# Patient Record
Sex: Male | Born: 1940
Health system: Southern US, Community
[De-identification: ages and names within clinical notes are randomized; demographics above are authoritative.]

## PROBLEM LIST (undated history)

## (undated) DIAGNOSIS — K5790 Diverticulosis of intestine, part unspecified, without perforation or abscess without bleeding: Secondary | ICD-10-CM

## (undated) DIAGNOSIS — F17200 Nicotine dependence, unspecified, uncomplicated: Secondary | ICD-10-CM

## (undated) DIAGNOSIS — I10 Essential (primary) hypertension: Secondary | ICD-10-CM

## (undated) DIAGNOSIS — N529 Male erectile dysfunction, unspecified: Secondary | ICD-10-CM

## (undated) DIAGNOSIS — R918 Other nonspecific abnormal finding of lung field: Secondary | ICD-10-CM

## (undated) DIAGNOSIS — Z972 Presence of dental prosthetic device (complete) (partial): Secondary | ICD-10-CM

## (undated) DIAGNOSIS — Z8739 Personal history of other diseases of the musculoskeletal system and connective tissue: Secondary | ICD-10-CM

## (undated) DIAGNOSIS — C61 Malignant neoplasm of prostate: Secondary | ICD-10-CM

## (undated) DIAGNOSIS — K219 Gastro-esophageal reflux disease without esophagitis: Secondary | ICD-10-CM

## (undated) DIAGNOSIS — R590 Localized enlarged lymph nodes: Secondary | ICD-10-CM

## (undated) DIAGNOSIS — I517 Cardiomegaly: Secondary | ICD-10-CM

## (undated) DIAGNOSIS — E785 Hyperlipidemia, unspecified: Secondary | ICD-10-CM

## (undated) DIAGNOSIS — E119 Type 2 diabetes mellitus without complications: Secondary | ICD-10-CM

## (undated) DIAGNOSIS — I6529 Occlusion and stenosis of unspecified carotid artery: Secondary | ICD-10-CM

## (undated) DIAGNOSIS — C3490 Malignant neoplasm of unspecified part of unspecified bronchus or lung: Secondary | ICD-10-CM

## (undated) DIAGNOSIS — Z973 Presence of spectacles and contact lenses: Secondary | ICD-10-CM

## (undated) HISTORY — DX: Gastro-esophageal reflux disease without esophagitis: K21.9

## (undated) HISTORY — DX: Male erectile dysfunction, unspecified: N52.9

## (undated) HISTORY — DX: Malignant neoplasm of prostate: C61

## (undated) HISTORY — PX: UPPER GASTROINTESTINAL ENDOSCOPY: SHX188

## (undated) HISTORY — DX: Diverticulosis of intestine, part unspecified, without perforation or abscess without bleeding: K57.90

## (undated) HISTORY — DX: Cardiomegaly: I51.7

## (undated) HISTORY — DX: Hyperlipidemia, unspecified: E78.5

## (undated) HISTORY — DX: Nicotine dependence, unspecified, uncomplicated: F17.200

## (undated) HISTORY — DX: Occlusion and stenosis of unspecified carotid artery: I65.29

## (undated) HISTORY — DX: Essential (primary) hypertension: I10

## (undated) HISTORY — DX: Type 2 diabetes mellitus without complications: E11.9

## (undated) HISTORY — PX: MULTIPLE TOOTH EXTRACTIONS: SHX2053

---

## 1999-11-23 ENCOUNTER — Encounter: Admission: RE | Admit: 1999-11-23 | Discharge: 1999-11-23 | Payer: Self-pay | Admitting: Family Medicine

## 1999-11-23 ENCOUNTER — Encounter: Payer: Self-pay | Admitting: Family Medicine

## 2001-01-19 ENCOUNTER — Ambulatory Visit: Admission: RE | Admit: 2001-01-19 | Discharge: 2001-04-19 | Payer: Self-pay | Admitting: Radiation Oncology

## 2001-02-07 DIAGNOSIS — C189 Malignant neoplasm of colon, unspecified: Secondary | ICD-10-CM

## 2001-02-07 HISTORY — PX: RADIOACTIVE SEED IMPLANT: SHX5150

## 2001-02-07 HISTORY — DX: Malignant neoplasm of colon, unspecified: C18.9

## 2001-04-02 ENCOUNTER — Encounter: Admission: RE | Admit: 2001-04-02 | Discharge: 2001-04-02 | Payer: Self-pay | Admitting: Radiation Oncology

## 2001-05-04 ENCOUNTER — Ambulatory Visit (HOSPITAL_BASED_OUTPATIENT_CLINIC_OR_DEPARTMENT_OTHER): Admission: RE | Admit: 2001-05-04 | Discharge: 2001-05-04 | Payer: Self-pay | Admitting: Urology

## 2001-05-28 ENCOUNTER — Ambulatory Visit: Admission: RE | Admit: 2001-05-28 | Discharge: 2001-08-26 | Payer: Self-pay | Admitting: Radiation Oncology

## 2001-12-03 ENCOUNTER — Ambulatory Visit: Admission: RE | Admit: 2001-12-03 | Discharge: 2001-12-03 | Payer: Self-pay | Admitting: Radiation Oncology

## 2003-01-01 ENCOUNTER — Inpatient Hospital Stay (HOSPITAL_COMMUNITY): Admission: EM | Admit: 2003-01-01 | Discharge: 2003-01-02 | Payer: Self-pay | Admitting: Emergency Medicine

## 2005-02-07 HISTORY — PX: COLONOSCOPY: SHX174

## 2005-11-24 ENCOUNTER — Ambulatory Visit (HOSPITAL_COMMUNITY): Admission: RE | Admit: 2005-11-24 | Discharge: 2005-11-24 | Payer: Self-pay | Admitting: Gastroenterology

## 2006-02-06 ENCOUNTER — Ambulatory Visit: Payer: Self-pay | Admitting: Family Medicine

## 2006-02-20 ENCOUNTER — Ambulatory Visit: Payer: Self-pay | Admitting: Family Medicine

## 2006-03-15 ENCOUNTER — Ambulatory Visit: Payer: Self-pay | Admitting: Family Medicine

## 2006-05-18 ENCOUNTER — Ambulatory Visit: Payer: Self-pay | Admitting: Family Medicine

## 2006-09-01 ENCOUNTER — Ambulatory Visit: Payer: Self-pay | Admitting: Family Medicine

## 2006-11-03 ENCOUNTER — Ambulatory Visit: Payer: Self-pay | Admitting: Family Medicine

## 2006-11-13 ENCOUNTER — Ambulatory Visit: Payer: Self-pay | Admitting: Family Medicine

## 2006-11-22 ENCOUNTER — Ambulatory Visit: Payer: Self-pay | Admitting: Family Medicine

## 2006-12-01 ENCOUNTER — Ambulatory Visit: Payer: Self-pay | Admitting: Family Medicine

## 2007-08-02 ENCOUNTER — Ambulatory Visit: Payer: Self-pay | Admitting: Family Medicine

## 2007-12-06 ENCOUNTER — Ambulatory Visit: Payer: Self-pay | Admitting: Family Medicine

## 2007-12-12 ENCOUNTER — Encounter: Admission: RE | Admit: 2007-12-12 | Discharge: 2007-12-12 | Payer: Self-pay | Admitting: Family Medicine

## 2007-12-14 ENCOUNTER — Ambulatory Visit: Payer: Self-pay | Admitting: Family Medicine

## 2008-05-28 ENCOUNTER — Ambulatory Visit: Payer: Self-pay | Admitting: Family Medicine

## 2009-01-07 ENCOUNTER — Ambulatory Visit: Payer: Self-pay | Admitting: Family Medicine

## 2009-01-09 ENCOUNTER — Ambulatory Visit: Payer: Self-pay | Admitting: Vascular Surgery

## 2009-07-15 ENCOUNTER — Ambulatory Visit: Payer: Self-pay | Admitting: Family Medicine

## 2009-07-22 ENCOUNTER — Ambulatory Visit: Payer: Self-pay | Admitting: Family Medicine

## 2010-02-28 ENCOUNTER — Encounter: Payer: Self-pay | Admitting: Family Medicine

## 2010-06-03 ENCOUNTER — Encounter (INDEPENDENT_AMBULATORY_CARE_PROVIDER_SITE_OTHER): Payer: Medicare Other | Admitting: Family Medicine

## 2010-06-03 DIAGNOSIS — B351 Tinea unguium: Secondary | ICD-10-CM

## 2010-06-03 DIAGNOSIS — I6529 Occlusion and stenosis of unspecified carotid artery: Secondary | ICD-10-CM

## 2010-06-03 DIAGNOSIS — E119 Type 2 diabetes mellitus without complications: Secondary | ICD-10-CM

## 2010-06-03 DIAGNOSIS — Z0000 Encounter for general adult medical examination without abnormal findings: Secondary | ICD-10-CM

## 2010-06-03 DIAGNOSIS — Z23 Encounter for immunization: Secondary | ICD-10-CM

## 2010-06-22 NOTE — Procedures (Signed)
CAROTID DUPLEX EXAM   INDICATION:  A bruit.   HISTORY:  Diabetes:  No.  Cardiac:  No.  Hypertension:  Yes.  Smoking:  Previous.  Previous Surgery:  No.  CV History:  Asymptomatic.  Amaurosis Fugax No, Paresthesias No, Hemiparesis No.                                       RIGHT             LEFT  Brachial systolic pressure:         170               140  Brachial Doppler waveforms:         Triphasic         Triphasic  Vertebral direction of flow:        Antegrade         Antegrade  DUPLEX VELOCITIES (cm/sec)  CCA peak systolic                   108               84  ECA peak systolic                   95                95  ICA peak systolic                   31                56  ICA end diastolic                   12                22  PLAQUE MORPHOLOGY:                  Mixed             Mixed  PLAQUE AMOUNT:                      Mild              Mild  PLAQUE LOCATION:                    ICA, ECA, bifurcation               ICA, bifurcation   IMPRESSION:  1. Mild plaque noted throughout internal carotid arteries bilaterally.  2. Internal carotid arteries are tortuous bilaterally.   ___________________________________________  Larina Earthly, M.D.   CJ/MEDQ  D:  01/09/2009  T:  01/10/2009  Job:  [id number]

## 2010-06-25 NOTE — Discharge Summary (Signed)
NAME:  Shaun Kennedy, Shaun Kennedy                          ACCOUNT NO.:  [id number]   MEDICAL RECORD NO.:  [id number]                   PATIENT TYPE:  INP   LOCATION:  0348                                 FACILITY:  [facility]   PHYSICIAN:  Hettie Holstein, D.O.                 DATE OF BIRTH:  11/26/1940   DATE OF ADMISSION:  12/31/2002  DATE OF DISCHARGE:  01/02/2003                                 DISCHARGE SUMMARY   A 23 HOUR OBSERVATION NOTE:   ADMISSION DIAGNOSIS:  Chest pain, atypical.   DISCHARGE DIAGNOSIS:  Chest pain.  No acute ischemic event per cardiac  enzymes.   ADDITIONAL DIAGNOSES:  1. Evidence of cardiomegaly radiographically.  2. Hypertension.  3. History of prostate carcinoma recently diagnosed 18 months previously     with insertion of radioactive seeds.   DISCHARGE MEDICATIONS:  1. Accupril 10 mg p.o. daily - new.  2. Lopressor 25 mg p.o. b.i.d. - new.   PROCEDURE:  None.   DISPOSITION:  The patient was scheduled for outpatient stress test, as  arranged prior to discharge, as he had refused to stay through the holidays  if possible.  At this time, he is scheduled for outpatient stress test at  9:30 on Monday with [facility] on [facility], arranged by  Dr. Vida Roller.  The patient was instructed to follow up with his  primary care physician in 2 weeks. He was instructed to hold Lopressor 24  hours prior to stress test, as well as remain n.p.o. after midnight, prior  to his stress test evaluation.   HISTORY OF PRESENT ILLNESS:  As noted by Dr. Renato Battles, this is a 70-year-  old [name] who presented with sudden onset of mid-sternal  chest pain on the evening of December 31, 2002.  There was no exertional or  emotional distress at the time the pain started, no radiation of pain. Pain  was intermittent and worse with deep breaths, sharp in nature and resolved  spontaneously without treatment.  This was associated with shortness of  breath, no cough, no fever, no splinting.   HOSPITAL COURSE:  The patient was evaluated in the ED with cardiac enzymes  and an EKG which showed nonspecific changes, negative D-dimer as well as  negative cardiac enzymes for 18 hours.  Subsequently, he had a  repeat chest x-ray to evaluate cardiac size, which was positive for  cardiomegaly.  At this time, the patient is stable for discharge and  recommended to have further outpatient evaluation and work-up for this new  finding of cardiomegaly and newly diagnosed hypertension.                                                Hettie Holstein, D.O.  ESS/MEDQ  D:  01/02/2003  T:  01/02/2003  Job:  [id number]   cc:   Sharlot Gowda, M.D.  [address] [place], [place] [address]  Fax: [phone]

## 2010-06-25 NOTE — Op Note (Signed)
[facility]  Patient:    Shaun Kennedy, Shaun Kennedy Visit Number: [id number] MRN: [id number]          Service Type: NES Location: NESC Attending Physician:  Ellwood Handler Dictated by:   Verl Dicker, M.D. Proc. Date: 05/04/01 Admit Date:  05/04/2001   CC:         Ronnald Nian, M.D.  Wynn Banker, M.D.   Operative Report  DOB:  1940-08-03.  LMD:  Ronnald Nian, M.D.  RADIATION ONCOLOGIST:  Wynn Banker, M.D.  UROLOGIST:  Verl Dicker, M.D.  PREOPERATIVE DIAGNOSIS:  Prostate cancer (CaP), prostate specific antigen 5.4, Biopsy December 29, 2000: Prostatic adenocarcinoma, Gleason score 3+4 involving 20% of the biopsies on the left. Discussed with patient various options for therapy, including watchful waiting, hormonal therapy, radiation therapy, and surgical prostatectomy. The patient was not interested in surgery, preferred combination of external radiation plus seed implantation.  POSTOPERATIVE DIAGNOSIS:  Prostate cancer (CaP), prostate specific antigen 5.4, Biopsy December 29, 2000: Prostatic adenocarcinoma, Gleason score 3+4 involving 20% of the biopsies on the left. Discussed with patient various options for therapy, including watchful waiting, hormonal therapy, radiation therapy, and surgical prostatectomy. The patient was not interested in surgery, preferred combination of external radiation plus seed implantation.  PROCEDURE:  Transperineal placement of radioactive palladium seeds.  ANESTHESIA:  General.  DRAINS:  18-French Foley.  DESCRIPTION OF PROCEDURE:  The patient was prepped and draped in the dorsal lithotomy position after institution of an adequate level of general anesthesia. Suprapubic fluoroscopy unit and transrectal ultrasound unit were positioned and duplicate images of the prostate were obtained. With the Foley catheter in place and rectal tube in place, seeds were then placed  according to the B3DTUI needle loading report, a total of 24 needles were used. Isotope Pd gold N99; number of seeds: 104; millicuries per seed: 0.900. All seeds were placed according to prearranged coordinates, position on fluoroscopy unit and ultrasound appeared appropriate. Once seeds were positioned, Foley catheter and rectal tube were withdrawn. Fiberoptic cystoscopy showed a normal urethra and sphincter, normal prostatic mucosa within the prostatic urethra, normal trigone and orifices, no evidence of bladder perforation or seeds within the bladder. Flexible cystoscope was then withdrawn and replaced with an 18-French Foley catheter. The patient was given a B&O suppository and returned to recovery in satisfactory condition. Dictated by:   Verl Dicker, M.D. Attending Physician:  Ellwood Handler DD:  05/04/01 TD:  05/04/01 Job: [id number]

## 2010-06-25 NOTE — H&P (Signed)
NAME:  Shaun Kennedy, Shaun Kennedy                          ACCOUNT NO.:  [id number]   MEDICAL RECORD NO.:  [id number]                   PATIENT TYPE:  INP   LOCATION:  0104                                 FACILITY:  [facility]   PHYSICIAN:  Renato Battles, M.D.                  DATE OF BIRTH:  02-08-1940   DATE OF ADMISSION:  12/31/2002  DATE OF DISCHARGE:                                HISTORY & PHYSICAL   REASON FOR ADMISSION:  Chest pain.   HISTORY OF PRESENT ILLNESS:  The patient is a 70 year old very pleasant  [name] who presented with sudden onset mid sternal chest pain  on the evening of December 31, 2002.  There was no exertion or emotional  distress at time that the pain started.  No radiation of the pain.  The pain  was intermittent and worse with deep breath, sharp in nature, and resolved  spontaneously at the ED without any treatment.  Associated with shortness of  breath.  No cough.  No fever.  No sweating.   REVIEW OF SYSTEMS:  CONSTITUTIONAL:  No fever, chills, or night sweats.  No  weight loss.  CARDIOPULMONARY:  No cough.  No orthopnea or PND.  Positive  for chest pain, shortness of breath.  GASTROINTESTINAL:  No nausea,  vomiting, diarrhea, constipation.  GENITOURINARY:  No dysuria, hematuria, or  urinary distention.   PAST MEDICAL HISTORY:  The patient had prostate cancer diagnosed recently  about 18 months earlier and was treated with insertion of radioactive seeds.   PAST SURGICAL HISTORY:  Negative except for insertion of the seeds.   SOCIAL HISTORY:  No tobacco, alcohol, or drugs.   FAMILY HISTORY:  Positive for hypertension and stroke in both parents.   ALLERGIES:  No known drug allergies.   HOME MEDICATIONS:  Baby aspirin.   PHYSICAL EXAMINATION:  GENERAL:  The patient is alert and oriented x3, in no  acute distress.  VITAL SIGNS:  Temperature 97.3, heart rate 66, respiratory rate 22, blood  pressure 173/83, O2 saturations 95% on room air.  HEENT:  The  head is atraumatic and normocephalic.  Pupils are equal, round,  reactive to light and accommodation.  The patient has bilateral senile  arcus.  NECK:  No lymphadenopathy.  No thyromegaly.  No JVD.  No carotid bruits.  CHEST:  Clear to auscultation bilaterally.  No wheezing, rales, or rhonchi.  HEART:  Regular rate and rhythm.  No murmurs, rubs, or gallops.  ABDOMEN:  Soft, nontender, nondistended.  Normoactive bowel sounds.  No  organomegaly.  EXTREMITIES:  No clubbing, cyanosis, edema.   STUDIES:  First set of cardiac enzymes showed CK 198, CK-MB 4.4, cardiac  index of 2.2, troponin 0.01.  Chest x-ray was negative.  EKG showed  nonspecific changes.  Basic metabolic panel was perfectly normal.  D-dimer  was normal.   ASSESSMENT/PLAN:  1. Chest pain.  There is  moderate likelihood for coronary artery disease in     this gentleman.  The only risk factors he demonstrates are age and weak     family history.  We are going to observe patient for 24 hours in     telemetry, check cardiac enzymes q.8h. for next 24 hours, monitor EKG,     check fasting lipid.  Meanwhile, patient will be on aspirin treatment and     beta blocker.  Nitroglycerin will be given only as needed.  2. Elevated blood pressure.  The patient has family history for this.  I     will start patient on low dose enalapril.  3. Prostate carcinoma.  The patient does not demonstrate any signs of     recurrence.  He currently has no weight loss.  Actually, he reported some     weight gain.  However, I am going to check some liver functions looking     for metastatic disease.  4. Disposition:  The patient needs some kind of stress test to be scheduled     or even administered before discharge.                                               Renato Battles, M.D.    SA/MEDQ  D:  01/01/2003  T:  01/01/2003  Job:  [id number]

## 2010-06-29 ENCOUNTER — Telehealth: Payer: Self-pay

## 2010-06-29 NOTE — Telephone Encounter (Signed)
Pt informed of what was done at [facility] vascular all was normal

## 2010-07-23 ENCOUNTER — Other Ambulatory Visit: Payer: Self-pay | Admitting: Family Medicine

## 2010-09-02 ENCOUNTER — Other Ambulatory Visit: Payer: Self-pay | Admitting: Family Medicine

## 2011-01-26 ENCOUNTER — Other Ambulatory Visit: Payer: Self-pay | Admitting: Family Medicine

## 2011-03-10 ENCOUNTER — Other Ambulatory Visit: Payer: Self-pay | Admitting: Family Medicine

## 2011-05-15 ENCOUNTER — Other Ambulatory Visit: Payer: Self-pay | Admitting: Family Medicine

## 2011-05-31 ENCOUNTER — Encounter: Payer: Medicare Other | Admitting: Family Medicine

## 2011-06-06 ENCOUNTER — Encounter: Payer: Self-pay | Admitting: Internal Medicine

## 2011-06-13 ENCOUNTER — Encounter: Payer: Self-pay | Admitting: Family Medicine

## 2011-06-13 ENCOUNTER — Other Ambulatory Visit: Payer: Self-pay | Admitting: Family Medicine

## 2011-06-13 ENCOUNTER — Ambulatory Visit (INDEPENDENT_AMBULATORY_CARE_PROVIDER_SITE_OTHER): Payer: Medicare Other | Admitting: Family Medicine

## 2011-06-13 DIAGNOSIS — J309 Allergic rhinitis, unspecified: Secondary | ICD-10-CM | POA: Insufficient documentation

## 2011-06-13 DIAGNOSIS — Z79899 Other long term (current) drug therapy: Secondary | ICD-10-CM

## 2011-06-13 DIAGNOSIS — Z8709 Personal history of other diseases of the respiratory system: Secondary | ICD-10-CM

## 2011-06-13 DIAGNOSIS — E1169 Type 2 diabetes mellitus with other specified complication: Secondary | ICD-10-CM

## 2011-06-13 DIAGNOSIS — I152 Hypertension secondary to endocrine disorders: Secondary | ICD-10-CM

## 2011-06-13 DIAGNOSIS — E785 Hyperlipidemia, unspecified: Secondary | ICD-10-CM | POA: Insufficient documentation

## 2011-06-13 DIAGNOSIS — E1159 Type 2 diabetes mellitus with other circulatory complications: Secondary | ICD-10-CM | POA: Insufficient documentation

## 2011-06-13 DIAGNOSIS — E119 Type 2 diabetes mellitus without complications: Secondary | ICD-10-CM | POA: Insufficient documentation

## 2011-06-13 DIAGNOSIS — Z8546 Personal history of malignant neoplasm of prostate: Secondary | ICD-10-CM

## 2011-06-13 DIAGNOSIS — I6529 Occlusion and stenosis of unspecified carotid artery: Secondary | ICD-10-CM

## 2011-06-13 DIAGNOSIS — N529 Male erectile dysfunction, unspecified: Secondary | ICD-10-CM

## 2011-06-13 DIAGNOSIS — I10 Essential (primary) hypertension: Secondary | ICD-10-CM

## 2011-06-13 LAB — CBC WITH DIFFERENTIAL/PLATELET
Basophils Absolute: 0 10*3/uL (ref 0.0–0.1)
Basophils Relative: 1 % (ref 0–1)
HCT: 39.3 % (ref 39.0–52.0)
Hemoglobin: 13.1 g/dL (ref 13.0–17.0)
Lymphocytes Relative: 30 % (ref 12–46)
Monocytes Absolute: 0.5 10*3/uL (ref 0.1–1.0)
Neutro Abs: 2.8 10*3/uL (ref 1.7–7.7)
Neutrophils Relative %: 48 % (ref 43–77)
RDW: 13.2 % (ref 11.5–15.5)
WBC: 5.8 10*3/uL (ref 4.0–10.5)

## 2011-06-13 LAB — LIPID PANEL
HDL: 69 mg/dL (ref >39)
LDL Cholesterol: 74 mg/dL (ref 0–99)

## 2011-06-13 LAB — COMPREHENSIVE METABOLIC PANEL
ALT: 14 U/L (ref 0–53)
AST: 22 U/L (ref 0–37)
Albumin: 4.2 g/dL (ref 3.5–5.2)
Alkaline Phosphatase: 43 U/L (ref 39–117)
BUN: 22 mg/dL (ref 6–23)
Calcium: 9.9 mg/dL (ref 8.4–10.5)
Chloride: 103 mEq/L (ref 96–112)
Potassium: 4.5 mEq/L (ref 3.5–5.3)
Sodium: 137 mEq/L (ref 135–145)
Total Protein: 8 g/dL (ref 6.0–8.3)

## 2011-06-13 LAB — POCT URINALYSIS DIPSTICK
Blood, UA: NEGATIVE
Ketones, UA: NEGATIVE
Protein, UA: NEGATIVE
Spec Grav, UA: 1.02
Urobilinogen, UA: NEGATIVE
pH, UA: 5

## 2011-06-13 LAB — POCT GLYCOSYLATED HEMOGLOBIN (HGB A1C): Hemoglobin A1C: 6.1

## 2011-06-13 LAB — POCT UA - MICROALBUMIN
Creatinine, POC: 227.8 mg/dL
Microalbumin Ur, POC: 20.9 mg/dL

## 2011-06-13 MED ORDER — LISINOPRIL-HYDROCHLOROTHIAZIDE 10-12.5 MG PO TABS: 1.0000 | ORAL_TABLET | Freq: Every day | ORAL | Status: DC

## 2011-06-13 MED ORDER — SIMVASTATIN 40 MG PO TABS: 40.0000 mg | ORAL_TABLET | ORAL | Status: DC

## 2011-06-13 MED ORDER — GLUCOSE BLOOD VI STRP: ORAL_STRIP | Status: DC

## 2011-06-13 MED ORDER — METFORMIN HCL ER 500 MG PO TB24: 500.0000 mg | ORAL_TABLET | Freq: Every day | ORAL | Status: DC

## 2011-06-13 MED ORDER — SILDENAFIL CITRATE 100 MG PO TABS: 50.0000 mg | ORAL_TABLET | Freq: Every day | ORAL | Status: DC | PRN

## 2011-06-13 NOTE — Progress Notes (Signed)
Subjective:    Patient ID: Shaun Kennedy, male    DOB: 1940-06-06, 71 y.o.   MRN: [id number]  HPI He is here for a followup visit. He has not been here in approximately one year. He does check his blood sugars intermittently and they're always in the low 100 range. He also checks his feet periodically. He has an eye exam scheduled in July. He exercises only 3 times per week and on days he does not exercise, he is playing golf. His allergies seem to be under good control. He has very little difficulty with his asthma. He is scheduled for carotid Doppler and will also get ABI at that time. He was seen recently by his urologist in followup for prostate cancer that was diagnosed in 2003. Would also like a refill on his Viagra. His social history was reviewed.  Review of Systems Negative except as above.    Objective:   Physical Exam BP 124/78  Pulse 65  Ht 5' 8.5" (1.74 m)  Wt 231 lb (104.781 kg)  BMI 34.61 kg/m2  SpO2 97%  General Appearance:    Alert, cooperative, no distress, appears stated age  Head:    Normocephalic, without obvious abnormality, atraumatic  Eyes:    PERRL, conjunctiva/corneas clear, EOM's intact,   Ears:    Normal TM's and external ear canals  Nose:   Nares normal, mucosa normal, no drainage or sinus   tenderness  Throat:   Lips, mucosa, and tongue normal; teeth and gums normal  Neck:   Supple, no lymphadenopathy;  thyroid:  no   enlargement/tenderness/nodules; no carotid   bruit or JVD  Back:    Spine nontender, no curvature, ROM normal, no CVA     tenderness  Lungs:     Clear to auscultation bilaterally without wheezes, rales or     ronchi; respirations unlabored  Chest Wall:    No tenderness or deformity   Heart:    Regular rate and rhythm, S1 and S2 normal, no murmur, rub   or gallop  Breast Exam:    No chest wall tenderness, masses or gynecomastia  Abdomen:     Soft, non-tender, nondistended, normoactive bowel sounds,    no masses, no hepatosplenomegaly    Genitalia:   deferred   Rectal:   deferred   Extremities:   No clubbing, cyanosis or edema  Pulses:   2+ and symmetric all extremities  Skin:   Skin color, texture, turgor normal, no rashes or lesions  Lymph nodes:   Cervical, supraclavicular, and axillary nodes normal  Neurologic:   CNII-XII intact, normal strength, sensation and gait; reflexes 2+ and symmetric throughout          Psych:   Normal mood, affect, hygiene and grooming.    Hemoglobin A1c 6.4       Assessment & Plan:   1. Diabetes mellitus  POCT HgB A1C, POCT UA - Microalbumin, POCT Urinalysis Dipstick, glucose blood (FREESTYLE LITE) test strip, metFORMIN (GLUCOPHAGE-XR) 500 MG 24 hr tablet, CBC with Differential, Comprehensive metabolic panel  2. ED (erectile dysfunction)  sildenafil (VIAGRA) 100 MG tablet  3. History of prostate cancer    4. Hypertension associated with diabetes  lisinopril-hydrochlorothiazide (PRINZIDE,ZESTORETIC) 10-12.5 MG per tablet  5. Hyperlipidemia LDL goal <70  simvastatin (ZOCOR) 40 MG tablet, Lipid panel  6. Allergic rhinitis, mild    7. History of asthma    8. Carotid stenosis    9. Encounter for long-term (current) use of other medications  CBC  with Differential, Comprehensive metabolic panel, Lipid panel   I encouraged him to remain physically active. Recommend he check his blood sugars 2 hours after some of his meals. Allies continues to good care of himself. Recheck here in 4 months.

## 2011-06-13 NOTE — Patient Instructions (Signed)
Check your blood sugars 2 hours after some of the meals to see how you're doing.

## 2011-06-14 NOTE — Progress Notes (Signed)
Quick Note:  The blood work is normal ______ 

## 2011-09-06 ENCOUNTER — Other Ambulatory Visit: Payer: Self-pay | Admitting: Family Medicine

## 2011-10-17 ENCOUNTER — Ambulatory Visit (INDEPENDENT_AMBULATORY_CARE_PROVIDER_SITE_OTHER): Payer: Medicare Other | Admitting: Family Medicine

## 2011-10-17 ENCOUNTER — Encounter: Payer: Self-pay | Admitting: Family Medicine

## 2011-10-17 VITALS — BP 120/70 | HR 68 | Temp 98.0°F | Resp 16 | Wt 234.0 lb

## 2011-10-17 DIAGNOSIS — I152 Hypertension secondary to endocrine disorders: Secondary | ICD-10-CM

## 2011-10-17 DIAGNOSIS — I10 Essential (primary) hypertension: Secondary | ICD-10-CM

## 2011-10-17 DIAGNOSIS — E785 Hyperlipidemia, unspecified: Secondary | ICD-10-CM

## 2011-10-17 DIAGNOSIS — E119 Type 2 diabetes mellitus without complications: Secondary | ICD-10-CM

## 2011-10-17 DIAGNOSIS — E1169 Type 2 diabetes mellitus with other specified complication: Secondary | ICD-10-CM

## 2011-10-17 DIAGNOSIS — Z8546 Personal history of malignant neoplasm of prostate: Secondary | ICD-10-CM

## 2011-10-17 DIAGNOSIS — J309 Allergic rhinitis, unspecified: Secondary | ICD-10-CM

## 2011-10-17 LAB — POCT GLYCOSYLATED HEMOGLOBIN (HGB A1C): Hemoglobin A1C: 6.4

## 2011-10-17 NOTE — Patient Instructions (Signed)
Check your blood sugars either before a meal or 2 hours after a meal 

## 2011-10-17 NOTE — Progress Notes (Signed)
  Subjective:    Patient ID: Shaun Kennedy, male    DOB: [birth year], 71 y.o.   MRN: [id number]  HPI He is here for a diabetes recheck. He continues on medications listed in the chart. His allergies are under good control. He is having some difficulty with leg cramping with exercise. He does have a previous history of PVD he does check his blood sugars usually twice per day but rarely after meals at the appropriate time. Last eye exam was July. He does periodically check his feet. He does not smoke or drink. His allergies seem to be under good control.   Review of Systems     Objective:   Physical Exam Alert and in no distress. Globin A1c 6.4      Assessment & Plan:   1. Diabetes mellitus  POCT glycosylated hemoglobin (Hb A1C)  2. Allergic rhinitis, mild    3. History of prostate cancer    4. Hyperlipidemia LDL goal <70    5. Hypertension associated with diabetes     I will send paperwork to see if he qualifies for a research protocol through  [employer]/

## 2011-11-18 ENCOUNTER — Telehealth: Payer: Self-pay | Admitting: Family Medicine

## 2011-11-18 DIAGNOSIS — I152 Hypertension secondary to endocrine disorders: Secondary | ICD-10-CM

## 2011-11-18 DIAGNOSIS — E1159 Type 2 diabetes mellitus with other circulatory complications: Secondary | ICD-10-CM

## 2011-11-18 DIAGNOSIS — E785 Hyperlipidemia, unspecified: Secondary | ICD-10-CM

## 2011-11-18 MED ORDER — METFORMIN HCL ER 500 MG PO TB24: 500.0000 mg | ORAL_TABLET | Freq: Every day | ORAL | Status: DC

## 2011-11-18 MED ORDER — SIMVASTATIN 40 MG PO TABS: 40.0000 mg | ORAL_TABLET | ORAL | Status: DC

## 2011-11-18 MED ORDER — LISINOPRIL-HYDROCHLOROTHIAZIDE 10-12.5 MG PO TABS: 1.0000 | ORAL_TABLET | Freq: Every day | ORAL | Status: DC

## 2011-11-18 NOTE — Telephone Encounter (Signed)
Sent meds in

## 2012-03-31 ENCOUNTER — Other Ambulatory Visit: Payer: Self-pay | Admitting: Family Medicine

## 2012-04-21 ENCOUNTER — Other Ambulatory Visit: Payer: Self-pay | Admitting: Family Medicine

## 2012-05-19 ENCOUNTER — Other Ambulatory Visit: Payer: Self-pay | Admitting: Family Medicine

## 2012-05-23 ENCOUNTER — Telehealth: Payer: Self-pay | Admitting: Family Medicine

## 2012-05-30 ENCOUNTER — Telehealth: Payer: Self-pay | Admitting: Family Medicine

## 2012-05-30 NOTE — Telephone Encounter (Signed)
[facility] APPROVED

## 2012-05-30 NOTE — Telephone Encounter (Signed)
Pt called for status on test strips.  I advised pt we have received approval from his insurance company on the prior authorization.

## 2012-06-11 ENCOUNTER — Ambulatory Visit (INDEPENDENT_AMBULATORY_CARE_PROVIDER_SITE_OTHER): Payer: Medicare Other | Admitting: Family Medicine

## 2012-06-11 ENCOUNTER — Encounter: Payer: Self-pay | Admitting: Family Medicine

## 2012-06-11 VITALS — BP 138/80 | HR 64 | Wt 239.0 lb

## 2012-06-11 DIAGNOSIS — L259 Unspecified contact dermatitis, unspecified cause: Secondary | ICD-10-CM

## 2012-06-11 DIAGNOSIS — L309 Dermatitis, unspecified: Secondary | ICD-10-CM

## 2012-06-11 LAB — CBC WITH DIFFERENTIAL/PLATELET
Basophils Absolute: 0 10*3/uL (ref 0.0–0.1)
Basophils Relative: 0 % (ref 0–1)
Eosinophils Relative: 10 % — ABNORMAL HIGH (ref 0–5)
HCT: 35.7 % — ABNORMAL LOW (ref 39.0–52.0)
Hemoglobin: 12.4 g/dL — ABNORMAL LOW (ref 13.0–17.0)
MCHC: 34.7 g/dL (ref 30.0–36.0)
MCV: 88.6 fL (ref 78.0–100.0)
Monocytes Absolute: 0.5 10*3/uL (ref 0.1–1.0)
Monocytes Relative: 8 % (ref 3–12)
RDW: 13.6 % (ref 11.5–15.5)

## 2012-06-11 LAB — COMPREHENSIVE METABOLIC PANEL
AST: 16 U/L (ref 0–37)
Albumin: 3.9 g/dL (ref 3.5–5.2)
Alkaline Phosphatase: 45 U/L (ref 39–117)
BUN: 14 mg/dL (ref 6–23)
Calcium: 9.2 mg/dL (ref 8.4–10.5)
Chloride: 104 mEq/L (ref 96–112)
Creat: 1.04 mg/dL (ref 0.50–1.35)
Glucose, Bld: 84 mg/dL (ref 70–99)
Potassium: 4 mEq/L (ref 3.5–5.3)

## 2012-06-11 NOTE — Progress Notes (Signed)
  Subjective:    Patient ID: Shaun Kennedy, male    DOB: [birth year], 72 y.o.   MRN: [id number]  HPI He has a 4 day history of erythema and pruritus this started in the left elbow area. He also noted on the left side of his neck over these lesions went away and now he is noticing right forearm and shin redness and itching. No pain, swelling, fever or chills.he cannot relate this to any foods or environmental exposure. No change in his medications. No cough, congestion, fever, chills.   Review of Systems     Objective:   Physical Exam 2 areas of erythema and induration were well demarcated were noted on the right forearm and right shin area. No other lesions are noted.       Assessment & Plan:  Dermatitis - Plan: CBC with Differential, Comprehensive metabolic panel if the blood work comes back negative, I will refer to dermatology

## 2012-06-12 ENCOUNTER — Telehealth: Payer: Self-pay

## 2012-06-12 NOTE — Progress Notes (Signed)
Quick Note:  CALLED PT HOME # LEFT MESSAGE WORD FOR WORD Labs are okay except for slightly low hemoglobin. Have him take a multivitamin with iron and repeat the hemoglobin in several months ______

## 2012-06-12 NOTE — Telephone Encounter (Signed)
Called to inform pt I got apt moved to may 13 at 11:20 pt was advised

## 2012-06-12 NOTE — Telephone Encounter (Signed)
PT INFORMED OF APPT MAY 27 AT 10 :20 AT [facility]

## 2012-06-14 ENCOUNTER — Encounter (HOSPITAL_COMMUNITY): Payer: Self-pay | Admitting: *Deleted

## 2012-06-14 ENCOUNTER — Emergency Department (HOSPITAL_COMMUNITY)
Admission: EM | Admit: 2012-06-14 | Discharge: 2012-06-14 | Disposition: A | Discharge status: Home / Self Care | Payer: Medicare Other | Source: Home / Self Care | Attending: Emergency Medicine | Admitting: Emergency Medicine

## 2012-06-14 DIAGNOSIS — L309 Dermatitis, unspecified: Secondary | ICD-10-CM

## 2012-06-14 DIAGNOSIS — L259 Unspecified contact dermatitis, unspecified cause: Secondary | ICD-10-CM

## 2012-06-14 MED ORDER — CETIRIZINE HCL 10 MG PO CAPS: 1.0000 | ORAL_CAPSULE | Freq: Every day | ORAL | Status: DC

## 2012-06-14 MED ORDER — FLUOCINONIDE 0.05 % EX CREA: TOPICAL_CREAM | Freq: Two times a day (BID) | CUTANEOUS | Status: AC

## 2012-06-14 NOTE — ED Notes (Signed)
C/o rash on his arms and back onset Sunday with itching.

## 2012-06-14 NOTE — ED Provider Notes (Signed)
History     CSN: [id number]  Arrival date & time 06/14/12  1523   First MD Initiated Contact with Patient 06/14/12 1610      Chief Complaint  Patient presents with  . Rash    (Consider location/radiation/quality/duration/timing/severity/associated sxs/prior treatment) HPI Comments: Patient presents to urgent care with an ongoing a chief rash that has developed on his left upper extremity on the medial and lateral aspect of his arm as well as some on his upper and lower back. It is very itchy and he's been applying Benadryl lotion to it. He has seen his primary care Dr. days ago for some lab work was performed and also a referral to a dermatologist was done by his office. Patient opted to come here today to be checked as he continues to experience significant itchiness. Patient denies any other symptoms such as fevers, generalized malaise arthralgias myalgias or headaches. He does have a history of eczema.  Patient is a 72 y.o. male presenting with rash. The history is provided by the patient.  Rash Location:  Torso and shoulder/arm Shoulder/arm rash location:  L arm Quality: dryness, itchiness and peeling   Quality: not blistering, not bruising, not swelling and not weeping   Severity:  Moderate Onset quality:  Gradual Progression:  Spreading Chronicity:  New Context: not animal contact   Relieved by:  Nothing Worsened by:  Nothing tried Ineffective treatments:  None tried Associated symptoms: no fatigue, no fever, no headaches, no joint pain, no nausea, no periorbital edema, no shortness of breath, no sore throat and not wheezing     Past Medical History  Diagnosis Date  . Allergic rhinitis   . Smoker   . LVH (left ventricular hypertrophy)     on EKG  . ED (erectile dysfunction)   . Diverticulosis   . Prostate cancer   . Asthma   . Carotid stenosis   . Dyslipidemia   . Hemorrhoids   . Diabetes mellitus   . HTN (hypertension)     Past Surgical History  Procedure  Laterality Date  . Colonoscopy  2007    Dr. Elnoria Shigeru    Family History  Problem Relation Age of Onset  . Heart failure Mother   . Stroke Father     History  Substance Use Topics  . Smoking status: Former Smoker    Quit date: 10/09/1987  . Smokeless tobacco: Never Used  . Alcohol Use: No      Review of Systems  Constitutional: Negative for fever, activity change, appetite change and fatigue.  HENT: Negative for sore throat.   Respiratory: Negative for shortness of breath and wheezing.   Gastrointestinal: Negative for nausea.  Musculoskeletal: Negative for arthralgias.  Skin: Positive for rash. Negative for color change, pallor and wound.  Neurological: Negative for headaches.    Allergies  Review of patient's allergies indicates no known allergies.  Home Medications   Current Outpatient Rx  Name  Route  Sig  Dispense  Refill  . aspirin 81 MG tablet   Oral   Take 81 mg by mouth daily.         Marland Kitchen FREESTYLE LITE test strip      USE TO TEST TWICE DAILY   100 each   prn   . glucose blood (FREESTYLE LITE) test strip      Use as instructed   100 each   prn   . lisinopril-hydrochlorothiazide (PRINZIDE,ZESTORETIC) 10-12.5 MG per tablet   Oral   Take 1 tablet by  mouth daily.   90 tablet   4   . metFORMIN (GLUCOPHAGE-XR) 500 MG 24 hr tablet   Oral   Take 1 tablet (500 mg total) by mouth daily with breakfast.   90 tablet   1   . Multiple Vitamins-Minerals (MULTIVITAMIN WITH MINERALS) tablet   Oral   Take 1 tablet by mouth daily.         . simvastatin (ZOCOR) 40 MG tablet   Oral   Take 1 tablet (40 mg total) by mouth 1 day or 1 dose.   90 tablet   4   . VITAMIN D, CHOLECALCIFEROL, PO   Oral   Take 500 Units by mouth.         . Cetirizine HCl (ZYRTEC ALLERGY) 10 MG CAPS   Oral   Take 1 capsule (10 mg total) by mouth daily. X 2 weeks   30 capsule   1   . fluocinonide cream (LIDEX) 0.05 %   Topical   Apply topically 2 (two) times daily.   30  g   0   . metFORMIN (GLUCOPHAGE-XR) 500 MG 24 hr tablet      TAKE 1 TABLET BY MOUTH DAILY WITH BREAKFAST.   30 tablet   0     PATIENT MUST HAVE DIABETES CHECK BEFORE ANYMORE RE .Marland Kitchen.   . EXPIRED: sildenafil (VIAGRA) 100 MG tablet   Oral   Take 0.5-1 tablets (50-100 mg total) by mouth daily as needed for erectile dysfunction.   5 tablet   11   . simvastatin (ZOCOR) 40 MG tablet      TAKE 1 TABLET BY MOUTH EVERY DAY   30 tablet   5   . simvastatin (ZOCOR) 40 MG tablet      TAKE 1 TABLET BY MOUTH EVERY DAY   30 tablet   5     BP 122/70  Pulse 60  Temp(Src) 98.6 F (37 C) (Oral)  Resp 16  SpO2 96%  Physical Exam  Nursing note and vitals reviewed. Constitutional: Vital signs are normal. He appears well-developed and well-nourished.  Non-toxic appearance. He does not have a sickly appearance. He does not appear ill. No distress.  HENT:  Head: Normocephalic.  Eyes: Conjunctivae are normal. No scleral icterus.  Neck: Neck supple. No JVD present.  Abdominal: He exhibits no distension. There is no tenderness.  Lymphadenopathy:    He has no cervical adenopathy.  Neurological: He is alert.  Skin: Rash noted. Rash is urticarial. There is erythema.       ED Course  Procedures (including critical care time)  Labs Reviewed - No data to display No results found.   1. Dermatitis of multiple sites       MDM  Rash seemed to be consistent with some of the areas of eczema or contact dermatitis. Patient has been prescribed a trial of a topical steroid with Lidex 1% as well as instructed to take Zyrtec for 7-10 days. Followup with PCP or dermatologist as previously recommended by his PCP if no improvement       Jimmie Molly, MD 06/14/12 1650

## 2012-06-20 ENCOUNTER — Other Ambulatory Visit: Payer: Self-pay | Admitting: Family Medicine

## 2012-06-21 ENCOUNTER — Encounter: Payer: Self-pay | Admitting: Family Medicine

## 2012-06-21 ENCOUNTER — Ambulatory Visit (INDEPENDENT_AMBULATORY_CARE_PROVIDER_SITE_OTHER): Payer: Medicare Other | Admitting: Family Medicine

## 2012-06-21 VITALS — BP 128/80 | HR 68 | Ht 67.5 in | Wt 235.0 lb

## 2012-06-21 DIAGNOSIS — L500 Allergic urticaria: Secondary | ICD-10-CM

## 2012-06-21 NOTE — Progress Notes (Signed)
Chief Complaint  Patient presents with  . Advice Only    itching on his back and arms since May 4th. Was scheduled with Dr.Lupton for 06/19/12 apparently he never got the message. Was seen at Urgent Care not doing any better.   Patient was seen 5/5 by Dr. Susann Givens with areas of itching, and induration that had started on 5/4 per pt.  No known new exposures, foods or other etiology.  Labs were checked--normal CBC (slightly low Hg) and normal chem panel.  He was then referred to dermatology, but apparently patient didn't get the message about the appointment, so missed it.  Since that time he has had ongoing symptoms. He also was seen in the urgent care on 5/8 with the same complaint, where they recommended zyrtec and rx'd topical steroids.  He had been applying fluocinonide 0.05% cream twice daily, and he thinks that is what helped those other areas of itching, but he has run out.  The one on the right upper arm is starting to come back--recurrent itching and getting harder.  No recurrences on the legs, or on the back.  He has an area of swelling, redness and itching on the back of the right upper arm which has been present since Monday.  The lesions that were present at last visit (on shin and left arm) resolved.  He has an upcoming appointment with Dr. Terri Piedra on 6/5, which was the next available appointment.  Again--denies any new foods, cleansers, soaps, detergents, fabric softeners.  No changes in medications.  Past Medical History  Diagnosis Date  . Allergic rhinitis   . Smoker     former  . LVH (left ventricular hypertrophy)     on EKG  . ED (erectile dysfunction)   . Diverticulosis   . Prostate cancer   . Asthma   . Carotid stenosis   . Dyslipidemia   . Hemorrhoids   . Diabetes mellitus   . HTN (hypertension)    Past Surgical History  Procedure Laterality Date  . Colonoscopy  2007    Dr. Elnoria Kenyata   History   Social History  . Marital Status: Married    Spouse Name: N/A    Number  of Children: N/A  . Years of Education: N/A   Occupational History  . Not on file.   Social History Main Topics  . Smoking status: Former Smoker    Quit date: 10/09/1987  . Smokeless tobacco: Never Used  . Alcohol Use: No  . Drug Use: No  . Sexually Active: Yes   Other Topics Concern  . Not on file   Social History Narrative   Married, no pets    Current Outpatient Prescriptions on File Prior to Visit  Medication Sig Dispense Refill  . aspirin 81 MG tablet Take 81 mg by mouth daily.      . Cetirizine HCl (ZYRTEC ALLERGY) 10 MG CAPS Take 1 capsule (10 mg total) by mouth daily. X 2 weeks  30 capsule  1  . FREESTYLE LITE test strip USE TO TEST TWICE DAILY  100 each  prn  . glucose blood (FREESTYLE LITE) test strip Use as instructed  100 each  prn  . lisinopril-hydrochlorothiazide (PRINZIDE,ZESTORETIC) 10-12.5 MG per tablet Take 1 tablet by mouth daily.  90 tablet  4  . metFORMIN (GLUCOPHAGE-XR) 500 MG 24 hr tablet Take 1 tablet (500 mg total) by mouth daily with breakfast.  90 tablet  1  . Multiple Vitamins-Minerals (MULTIVITAMIN WITH MINERALS) tablet Take 1 tablet  by mouth daily.      . sildenafil (VIAGRA) 100 MG tablet Take 0.5-1 tablets (50-100 mg total) by mouth daily as needed for erectile dysfunction.  5 tablet  11  . simvastatin (ZOCOR) 40 MG tablet Take 1 tablet (40 mg total) by mouth 1 day or 1 dose.  90 tablet  4  . VITAMIN D, CHOLECALCIFEROL, PO Take 500 Units by mouth.      . fluocinonide cream (LIDEX) 0.05 % Apply topically 2 (two) times daily.  30 g  0   No current facility-administered medications on file prior to visit.   No Known Allergies  ROS:  Denies fevers, URI symptoms, cough, shortness of breath, GI complaints, chest pain, other skin concerns.  PHYSICAL EXAM: BP 128/80  Pulse 68  Ht 5' 7.5" (1.715 m)  Wt 235 lb (106.595 kg)  BMI 36.24 kg/m2 Well developed, pleasant male in no distress Right posterior upper arm: 4x3 cm, 1.5x1.5cm, and 2.5cm  indurated, warm, erythematous lesions are noted next to each other on the posterior aspect of the upper arm.  These are urticarial.  Other than being red, the skin is otherwise smooth, without any scaling, flaking, drainage.  No fluctuance.  Left arm--some hyperpigmentation and dry patches.  No erythema, warmth  ASSESSMENT/PLAN:  Allergic urticaria  Urticaria--briefly discussed oral steroids, and risk of elevating blood sugars.  Declines starting this today.  Instead will try increasing zyrtec to twice daily, and adding zantac twice daily.  Okay to use topical benadryl as needed.  May need course of oral steroids if he continues to have spread and new areas develop.  Call if worsening--aware that sugars will temporarily get high while on steroids (which is why he declines trial of this today).  He may need allergy referral.  DM--last A1c was 10/2011, 6.4. Has appt scheduled for 5/29 with Dr. Susann Givens for physical/labs per pt.   F/u immediately if any throat swelling, shortness of breath, or other acutely worsening symptoms develop

## 2012-06-21 NOTE — Patient Instructions (Addendum)
Increase the zyrtec to taking it twice daily. Add Zantac 150mg  twice daily (this is available over-the-counter). Use topical benadryl as needed.  If you continue to develop new areas that are raised and itchy, like what you have today, then I think you will need to take a course of steroids (oral prednisone).  This will make your sugars go up, but just temporarily while on the medications, and it might help with the allergic reaction.  If you are NOT continuing to get worse, and the medications are helping control it (even if not completely), then continue these medications until you see Dr. Terri Piedra as scheduled.  You can also see what Dr. Susann Givens thinks when you see him again in 2 weeks for your next visit.  The area on your right arm looks like urticaria (hives).  The area on your left arm looks more like dry skin patches/eczema. If the redness is increasing in size, if it becomes painful, weepy or develops into a boil, if you develop fever, we will need to see you again.  Hives Hives are itchy, red, swollen areas of the skin. They can vary in size and location on your body. Hives can come and go for hours or several days (acute hives) or for several weeks (chronic hives). Hives do not spread from person to person (noncontagious). They may get worse with scratching, exercise, and emotional stress. CAUSES   Allergic reaction to food, additives, or drugs.  Infections, including the common cold.  Illness, such as vasculitis, lupus, or thyroid disease.  Exposure to sunlight, heat, or cold.  Exercise.  Stress.  Contact with chemicals. SYMPTOMS   Red or white swollen patches on the skin. The patches may change size, shape, and location quickly and repeatedly.  Itching.  Swelling of the hands, feet, and face. This may occur if hives develop deeper in the skin. DIAGNOSIS  Your caregiver can usually tell what is wrong by performing a physical exam. Skin or blood tests may also be done to  determine the cause of your hives. In some cases, the cause cannot be determined. TREATMENT  Mild cases usually get better with medicines such as antihistamines. Severe cases may require an emergency epinephrine injection. If the cause of your hives is known, treatment includes avoiding that trigger.  HOME CARE INSTRUCTIONS   Avoid causes that trigger your hives.  Take antihistamines as directed by your caregiver to reduce the severity of your hives. Non-sedating or low-sedating antihistamines are usually recommended. Do not drive while taking an antihistamine.  Take any other medicines prescribed for itching as directed by your caregiver.  Wear loose-fitting clothing.  Keep all follow-up appointments as directed by your caregiver. SEEK MEDICAL CARE IF:   You have persistent or severe itching that is not relieved with medicine.  You have painful or swollen joints. SEEK IMMEDIATE MEDICAL CARE IF:   You have a fever.  Your tongue or lips are swollen.  You have trouble breathing or swallowing.  You feel tightness in the throat or chest.  You have abdominal pain. These problems may be the first sign of a life-threatening allergic reaction. Call your local emergency services (911 in [place] MAKE SURE YOU:   Understand these instructions.  Will watch your condition.  Will get help right away if you are not doing well or get worse. Document Released: 01/24/2005 Document Revised: 07/26/2011 Document Reviewed: 04/19/2011 [facility] Patient Information 2013 [place], [place].

## 2012-07-06 ENCOUNTER — Ambulatory Visit (INDEPENDENT_AMBULATORY_CARE_PROVIDER_SITE_OTHER): Payer: Medicare Other | Admitting: Family Medicine

## 2012-07-06 ENCOUNTER — Encounter: Payer: Self-pay | Admitting: Family Medicine

## 2012-07-06 VITALS — BP 130/68 | HR 66 | Ht 68.0 in | Wt 232.0 lb

## 2012-07-06 DIAGNOSIS — J309 Allergic rhinitis, unspecified: Secondary | ICD-10-CM

## 2012-07-06 DIAGNOSIS — Z8709 Personal history of other diseases of the respiratory system: Secondary | ICD-10-CM

## 2012-07-06 DIAGNOSIS — Z8546 Personal history of malignant neoplasm of prostate: Secondary | ICD-10-CM

## 2012-07-06 DIAGNOSIS — I10 Essential (primary) hypertension: Secondary | ICD-10-CM

## 2012-07-06 DIAGNOSIS — E785 Hyperlipidemia, unspecified: Secondary | ICD-10-CM

## 2012-07-06 DIAGNOSIS — E1159 Type 2 diabetes mellitus with other circulatory complications: Secondary | ICD-10-CM

## 2012-07-06 DIAGNOSIS — I152 Hypertension secondary to endocrine disorders: Secondary | ICD-10-CM

## 2012-07-06 DIAGNOSIS — I6521 Occlusion and stenosis of right carotid artery: Secondary | ICD-10-CM

## 2012-07-06 DIAGNOSIS — E119 Type 2 diabetes mellitus without complications: Secondary | ICD-10-CM

## 2012-07-06 DIAGNOSIS — B351 Tinea unguium: Secondary | ICD-10-CM

## 2012-07-06 DIAGNOSIS — N529 Male erectile dysfunction, unspecified: Secondary | ICD-10-CM

## 2012-07-06 DIAGNOSIS — I6529 Occlusion and stenosis of unspecified carotid artery: Secondary | ICD-10-CM

## 2012-07-06 DIAGNOSIS — E1169 Type 2 diabetes mellitus with other specified complication: Secondary | ICD-10-CM

## 2012-07-06 LAB — POCT GLYCOSYLATED HEMOGLOBIN (HGB A1C): Hemoglobin A1C: 6.2

## 2012-07-06 LAB — POCT UA - MICROALBUMIN: Microalbumin Ur, POC: 30 mg/dL

## 2012-07-06 NOTE — Progress Notes (Signed)
Subjective:    Shaun Kennedy is a 72 y.o. male who presents for follow-up of Type 2 diabetes mellitus.    Home blood sugar records: 100 TO 143 PT TEST 2 TIMES A DAY  Current symptoms/problems NONE Daily foot checks, foot concerns: YES Last eye exam:  07/07/11   Medication compliance: Current diet: NO Current exercise: WALKING 3 TO 4 TIMES A WEEK Known diabetic complications: peripheral vascular disease Cardiovascular risk factors: advanced age (older than 73 for men, 33 for women), diabetes mellitus, dyslipidemia, hypertension, male gender, obesity (BMI >= 30 kg/m2) and sedentary lifestyle He has a remote history of prostate cancer and get yearly PSAs which I will order. He does have ED however at this time he is not in need of any medication. His allergies and asthma are under good control. He has had a carotid study done within the last year.  The following portions of the patient's history were reviewed and updated as appropriate: allergies, current medications, past family history, past medical history, past social history, past surgical history and problem list. Advanced directive was discussed with him. He does have one set up. ROS as in subjective above    Objective:    Ht 5\' 8"  (1.727 m)  Wt 232 lb (105.235 kg)  BMI 35.28 kg/m2  There were no vitals filed for this visit.  General appearence: alert, no distress, WD/WN Neck: supple, no lymphadenopathy, no thyromegaly, no masses Heart: RRR, normal S1, S2, no murmurs Lungs: CTA bilaterally, no wheezes, rhonchi, or rales Abdomen: +bs, soft, non tender, non distended, no masses, no hepatomegaly, no splenomegaly Pulses: 2+ symmetric, upper and lower extremities, normal cap refill Ext: no edema Foot exam: dorsalis pedis pulse not palpable.thickening of the nails is noted. Neuro: foot monofilament exam normal   Lab Review Lab Results  Component Value Date   HGBA1C 6.4 10/17/2011   Lab Results  Component Value Date   CHOL  155 06/13/2011   HDL 69 06/13/2011   LDLCALC 74 06/13/2011   TRIG 59 06/13/2011   CHOLHDL 2.2 06/13/2011   No results found for this basename: Concepcion Elk     Chemistry      Component Value Date/Time   NA 140 06/11/2012 1556   K 4.0 06/11/2012 1556   CL 104 06/11/2012 1556   CO2 24 06/11/2012 1556   BUN 14 06/11/2012 1556   CREATININE 1.04 06/11/2012 1556      Component Value Date/Time   CALCIUM 9.2 06/11/2012 1556   ALKPHOS 45 06/11/2012 1556   AST 16 06/11/2012 1556   ALT 15 06/11/2012 1556   BILITOT 0.6 06/11/2012 1556        Chemistry      Component Value Date/Time   NA 140 06/11/2012 1556   K 4.0 06/11/2012 1556   CL 104 06/11/2012 1556   CO2 24 06/11/2012 1556   BUN 14 06/11/2012 1556   CREATININE 1.04 06/11/2012 1556      Component Value Date/Time   CALCIUM 9.2 06/11/2012 1556   ALKPHOS 45 06/11/2012 1556   AST 16 06/11/2012 1556   ALT 15 06/11/2012 1556   BILITOT 0.6 06/11/2012 1556       Last optometry/ophthalmology exam reviewed from:he will schedule and I exam    Assessment:  Diabetes mellitus - Plan: POCT glycosylated hemoglobin (Hb A1C), POCT UA - Microalbumin, Lower Extremity Arterial Doppler Bilateral  ED (erectile dysfunction)  History of prostate cancer - Plan: PSA  Hypertension associated with diabetes  Hyperlipidemia LDL  goal <70 - Plan: Lipid panel  Allergic rhinitis, mild  History of asthma  Carotid stenosis, right        Plan:    1.  Rx changes: none 2.  Education: Reviewed 'ABCs' of diabetes management (respective goals in parentheses):  A1C (<7), blood pressure (<130/80), and cholesterol (LDL <100). 3.  Compliance at present is estimated to be good. Efforts to improve compliance (if necessary) will be directed at none. 4. Follow up: 4 months  He will be scheduled for lower arterial Doppler studies.

## 2012-07-07 LAB — LIPID PANEL
HDL: 69 mg/dL (ref >39)
LDL Cholesterol: 81 mg/dL (ref 0–99)
Total CHOL/HDL Ratio: 2.4 Ratio
Triglycerides: 66 mg/dL (ref <150)
VLDL: 13 mg/dL (ref 0–40)

## 2012-07-09 NOTE — Progress Notes (Signed)
Quick Note:  LEFT MESSAGE FOR PT WORD FOR WORD Labs look good. PSA is 0.05 his last one was 0.07. ON PT CELL # ______

## 2012-07-31 ENCOUNTER — Other Ambulatory Visit: Payer: Self-pay | Admitting: Family Medicine

## 2012-08-01 ENCOUNTER — Ambulatory Visit (HOSPITAL_COMMUNITY)
Admission: RE | Admit: 2012-08-01 | Discharge: 2012-08-01 | Disposition: A | Discharge status: Home / Self Care | Payer: Medicare Other | Source: Ambulatory Visit | Attending: Cardiovascular Disease | Admitting: Cardiovascular Disease

## 2012-08-01 DIAGNOSIS — I70219 Atherosclerosis of native arteries of extremities with intermittent claudication, unspecified extremity: Secondary | ICD-10-CM

## 2012-08-01 DIAGNOSIS — E119 Type 2 diabetes mellitus without complications: Secondary | ICD-10-CM | POA: Insufficient documentation

## 2012-08-01 NOTE — Progress Notes (Signed)
Lower Extremity Arterial Duplex Completed. Positive for 70-99% diameter reduction in the left mid SFA. Erlene Quan

## 2012-08-29 ENCOUNTER — Telehealth: Payer: Self-pay | Admitting: Internal Medicine

## 2012-08-29 DIAGNOSIS — E785 Hyperlipidemia, unspecified: Secondary | ICD-10-CM

## 2012-08-29 MED ORDER — METFORMIN HCL ER 500 MG PO TB24: ORAL_TABLET | ORAL | Status: DC

## 2012-08-29 MED ORDER — SIMVASTATIN 40 MG PO TABS: 40.0000 mg | ORAL_TABLET | ORAL | Status: DC

## 2012-08-29 NOTE — Telephone Encounter (Signed)
SENT MEDS IN  

## 2012-08-29 NOTE — Telephone Encounter (Signed)
Refill request for simvasatin 40mg  and metformin 5000mg  all 90 day supply to [facility] [facility] [address]

## 2012-09-12 ENCOUNTER — Ambulatory Visit (INDEPENDENT_AMBULATORY_CARE_PROVIDER_SITE_OTHER): Payer: Medicare Other | Admitting: Family Medicine

## 2012-09-12 VITALS — BP 140/70 | HR 70

## 2012-09-12 DIAGNOSIS — M25569 Pain in unspecified knee: Secondary | ICD-10-CM

## 2012-09-12 DIAGNOSIS — M25562 Pain in left knee: Secondary | ICD-10-CM

## 2012-09-12 NOTE — Progress Notes (Signed)
  Subjective:    Patient ID: Shaun Kennedy, male    DOB: [birth year], 72 y.o.   MRN: [id number]  HPI Yesterday after finishing playing golf he was getting into his car. When he got into the car he noted left posterior knee pain. He is left-handed but did not remember any injuries to his knee while playing golf. He now complains of swelling and pain. Swelling is mainly posterior. There is no locking, grinding or giving way.   Review of Systems     Objective:   Physical Exam Exam of the left knee shows no effusion but fullness to the postero-medial as well as posterior knee area. McMurray's testing causes pain. Anterior drawer negative. Medial collateral ligament intact.      Assessment & Plan:  Left knee pain  he does have full in the popliteal fossa indicating possible Bakers cyst. His exam point towards medial meniscal damage. ice for 20 minutes 3 times per day. A knee sleeve as needed for comfort. 4 Advil 3 times per day. Return here in about 10 days to 2 weeks for recheck

## 2012-09-12 NOTE — Patient Instructions (Signed)
Ice for 20 minutes 3 times per day. A knee sleeve as needed for comfort. 4 Advil 3 times per day.

## 2012-09-17 ENCOUNTER — Other Ambulatory Visit: Payer: Self-pay | Admitting: Family Medicine

## 2012-09-24 ENCOUNTER — Ambulatory Visit: Payer: Medicare Other | Admitting: Family Medicine

## 2012-09-27 ENCOUNTER — Ambulatory Visit
Admission: RE | Admit: 2012-09-27 | Discharge: 2012-09-27 | Disposition: A | Discharge status: Home / Self Care | Payer: Medicare Other | Source: Ambulatory Visit | Attending: Family Medicine | Admitting: Family Medicine

## 2012-09-27 ENCOUNTER — Ambulatory Visit (INDEPENDENT_AMBULATORY_CARE_PROVIDER_SITE_OTHER): Payer: Medicare Other | Admitting: Family Medicine

## 2012-09-27 DIAGNOSIS — M25562 Pain in left knee: Secondary | ICD-10-CM

## 2012-09-27 DIAGNOSIS — M25569 Pain in unspecified knee: Secondary | ICD-10-CM

## 2012-09-27 NOTE — Progress Notes (Signed)
  Subjective:    Patient ID: Shaun Kennedy, male    DOB: 17-Sep-1940, 72 y.o.   MRN: [id number]  HPI He is here for recheck. He continues to have knee pain especially in the postero-medial aspect of his knee. No popping, locking or grinding.   Review of Systems     Objective:   Physical Exam Left knee exam shows no effusion but some tenderness in the posterior medial aspect of the joint. McMurray's testing medially was quite uncomfortable. Negative anterior drawer. No effusion is noted       Assessment & Plan:  Left knee pain - Plan: DG Knee 1-2 Views Left  his symptoms are suggestive of meniscal damage. If the x-ray is essentially negative I will then order an MRI.

## 2012-09-28 NOTE — Progress Notes (Signed)
Quick Note:  PT INFORMED OF XRAY RESULTS I HAVE CALLED HIS INS.TRYING TO GET A PRIOR AUTH. FOR MRI BUT THEY WANT ME TO FAX REQUEST W/OV NOTE X-RAY RESULT I HAVE FAXED ALL INFO TO THEM ______

## 2012-10-01 ENCOUNTER — Encounter: Payer: Self-pay | Admitting: Internal Medicine

## 2012-10-01 ENCOUNTER — Other Ambulatory Visit: Payer: Self-pay | Admitting: Internal Medicine

## 2012-10-01 DIAGNOSIS — M25562 Pain in left knee: Secondary | ICD-10-CM

## 2012-10-05 ENCOUNTER — Other Ambulatory Visit: Payer: Medicare Other

## 2012-10-05 ENCOUNTER — Ambulatory Visit
Admission: RE | Admit: 2012-10-05 | Discharge: 2012-10-05 | Disposition: A | Discharge status: Home / Self Care | Payer: Medicare Other | Source: Ambulatory Visit | Attending: Family Medicine | Admitting: Family Medicine

## 2012-10-05 DIAGNOSIS — M25562 Pain in left knee: Secondary | ICD-10-CM

## 2012-10-07 ENCOUNTER — Other Ambulatory Visit: Payer: Self-pay | Admitting: Family Medicine

## 2012-10-09 ENCOUNTER — Other Ambulatory Visit: Payer: Self-pay

## 2012-10-09 DIAGNOSIS — M25562 Pain in left knee: Secondary | ICD-10-CM

## 2012-10-09 NOTE — Progress Notes (Signed)
Quick Note:  PATIENT WAS INFORMED OF MRI I HAVE SCHEDULED AN APPOINTMENT WITH DR.DEAN [phone] 10/11/12 PT IS AWARE ______

## 2012-11-09 ENCOUNTER — Ambulatory Visit (INDEPENDENT_AMBULATORY_CARE_PROVIDER_SITE_OTHER): Payer: Medicare Other | Admitting: Family Medicine

## 2012-11-09 ENCOUNTER — Encounter: Payer: Self-pay | Admitting: Family Medicine

## 2012-11-09 VITALS — BP 120/70 | HR 65

## 2012-11-09 DIAGNOSIS — I152 Hypertension secondary to endocrine disorders: Secondary | ICD-10-CM

## 2012-11-09 DIAGNOSIS — E1159 Type 2 diabetes mellitus with other circulatory complications: Secondary | ICD-10-CM

## 2012-11-09 DIAGNOSIS — Z9889 Other specified postprocedural states: Secondary | ICD-10-CM

## 2012-11-09 DIAGNOSIS — E1169 Type 2 diabetes mellitus with other specified complication: Secondary | ICD-10-CM

## 2012-11-09 DIAGNOSIS — E119 Type 2 diabetes mellitus without complications: Secondary | ICD-10-CM

## 2012-11-09 DIAGNOSIS — E785 Hyperlipidemia, unspecified: Secondary | ICD-10-CM

## 2012-11-09 DIAGNOSIS — I10 Essential (primary) hypertension: Secondary | ICD-10-CM

## 2012-11-09 LAB — POCT GLYCOSYLATED HEMOGLOBIN (HGB A1C): Hemoglobin A1C: 6.5

## 2012-11-09 NOTE — Progress Notes (Signed)
  Subjective:    Shaun Kennedy is a 72 y.o. male who presents for follow-up of Type 2 diabetes mellitus.    Home blood sugar records: 107 AVG.  Current symptoms/problems NO Daily foot checks: Any foot concerns: NO Last eye exam:  08/2012   Medication compliance: Current diet: DIABETIC DIET Current exercise: NONE (KNEE SURGERY) Known diabetic complications: peripheral vascular disease Cardiovascular risk factors: advanced age (older than 1 for men, 68 for women), diabetes mellitus, dyslipidemia, hypertension, male gender and obesity (BMI >= 30 kg/m2)   The following portions of the patient's history were reviewed and updated as appropriate: allergies, current medications, past family history, past medical history, past social history, past surgical history and problem list.  ROS as in subjective above    Objective:    BP 120/70  Pulse 65  Filed Vitals:   11/09/12 0853  BP: 120/70  Pulse: 65    General appearence: alert, no distress, WD/WN   Lab Review Lab Results  Component Value Date   HGBA1C 6.2 07/06/2012   Lab Results  Component Value Date   CHOL 163 07/06/2012   HDL 69 07/06/2012   LDLCALC 81 07/06/2012   TRIG 66 07/06/2012   CHOLHDL 2.4 07/06/2012   No results found for this basenameConcepcion Elk     Chemistry      Component Value Date/Time   NA 140 06/11/2012 1556   K 4.0 06/11/2012 1556   CL 104 06/11/2012 1556   CO2 24 06/11/2012 1556   BUN 14 06/11/2012 1556   CREATININE 1.04 06/11/2012 1556      Component Value Date/Time   CALCIUM 9.2 06/11/2012 1556   ALKPHOS 45 06/11/2012 1556   AST 16 06/11/2012 1556   ALT 15 06/11/2012 1556   BILITOT 0.6 06/11/2012 1556        Chemistry      Component Value Date/Time   NA 140 06/11/2012 1556   K 4.0 06/11/2012 1556   CL 104 06/11/2012 1556   CO2 24 06/11/2012 1556   BUN 14 06/11/2012 1556   CREATININE 1.04 06/11/2012 1556      Component Value Date/Time   CALCIUM 9.2 06/11/2012 1556   ALKPHOS 45 06/11/2012 1556   AST 16  06/11/2012 1556   ALT 15 06/11/2012 1556   BILITOT 0.6 06/11/2012 1556        hemoglobin A1c is 6.5  Assessment:  Diabetes mellitus - Plan: POCT glycosylated hemoglobin (Hb A1C)  Hypertension associated with diabetes  Hyperlipidemia LDL goal <70  History of arthroscopy of knee        Plan:    1.  Rx changes: none 2.  Education: Reviewed 'ABCs' of diabetes management (respective goals in parentheses):  A1C (<7), blood pressure (<130/80), and cholesterol (LDL <100). 3.  Compliance at present is estimated to be good. Efforts to improve compliance (if necessary) will be directed at Nothing. 4. Follow up: 4 months  Encouraged him to continue to take good care of himself and when his knee is better to start back into a regular exercise program.

## 2012-11-23 ENCOUNTER — Telehealth: Payer: Self-pay | Admitting: Internal Medicine

## 2012-11-23 NOTE — Telephone Encounter (Signed)
Faxed over medical records to [facility] @ [phone]

## 2012-12-10 ENCOUNTER — Other Ambulatory Visit: Payer: Self-pay | Admitting: Family Medicine

## 2013-01-23 ENCOUNTER — Telehealth: Payer: Self-pay | Admitting: Family Medicine

## 2013-01-23 NOTE — Telephone Encounter (Signed)
Pt called stating he "missed" a call from our office yesterday. He states his phone rang twice from our number but he never spoke to anyone. Informed him that they are calling patients to remind them to get flu shots. Patient states he does not get flu shots

## 2013-02-28 ENCOUNTER — Other Ambulatory Visit: Payer: Self-pay | Admitting: Family Medicine

## 2013-03-12 ENCOUNTER — Ambulatory Visit (INDEPENDENT_AMBULATORY_CARE_PROVIDER_SITE_OTHER): Payer: Medicare HMO | Admitting: Family Medicine

## 2013-03-12 ENCOUNTER — Encounter: Payer: Self-pay | Admitting: Family Medicine

## 2013-03-12 VITALS — BP 160/84 | HR 58 | Wt 236.0 lb

## 2013-03-12 DIAGNOSIS — E1169 Type 2 diabetes mellitus with other specified complication: Secondary | ICD-10-CM

## 2013-03-12 DIAGNOSIS — E1159 Type 2 diabetes mellitus with other circulatory complications: Secondary | ICD-10-CM

## 2013-03-12 DIAGNOSIS — I10 Essential (primary) hypertension: Secondary | ICD-10-CM

## 2013-03-12 DIAGNOSIS — E785 Hyperlipidemia, unspecified: Secondary | ICD-10-CM

## 2013-03-12 DIAGNOSIS — I152 Hypertension secondary to endocrine disorders: Secondary | ICD-10-CM

## 2013-03-12 DIAGNOSIS — E119 Type 2 diabetes mellitus without complications: Secondary | ICD-10-CM

## 2013-03-12 LAB — POCT GLYCOSYLATED HEMOGLOBIN (HGB A1C): Hemoglobin A1C: 6.4

## 2013-03-12 NOTE — Addendum Note (Signed)
Addended by: Lise Auer on: 03/12/2013 02:21 PM   Modules accepted: Orders

## 2013-03-12 NOTE — Progress Notes (Signed)
   Subjective:    Patient ID: Shaun Kennedy, male    DOB: July 18, 1940, 73 y.o.   MRN: [id number]  HPI He is here for a medication check. He did have left knee surgery and is still having some residual discomfort from this. He continues in physical therapy for this. He does have underlying diabetes. He checks his blood sugars daily and they run in the low 100s. Her exercise is been limited due to his knee trouble. He does not smoke or drink. Continues on medications listed in the chart. Does check his feet 3 times per week his last eye exam was last year.   Review of Systems     Objective:   Physical Exam Alert and in no distress. Hemoglobin A1c is 6.4       Assessment & Plan:  Diabetes mellitus  Hyperlipidemia LDL goal <70  Hypertension associated with diabetes  encouraged him to continue to take good care of himself and slowly increase his physical activities.

## 2013-05-28 ENCOUNTER — Other Ambulatory Visit: Payer: Self-pay | Admitting: Family Medicine

## 2013-06-07 ENCOUNTER — Telehealth: Payer: Self-pay | Admitting: Family Medicine

## 2013-06-07 ENCOUNTER — Other Ambulatory Visit: Payer: Self-pay | Admitting: Family Medicine

## 2013-06-07 DIAGNOSIS — I152 Hypertension secondary to endocrine disorders: Secondary | ICD-10-CM

## 2013-06-07 DIAGNOSIS — I10 Essential (primary) hypertension: Principal | ICD-10-CM

## 2013-06-07 DIAGNOSIS — E1159 Type 2 diabetes mellitus with other circulatory complications: Secondary | ICD-10-CM

## 2013-06-07 MED ORDER — LISINOPRIL-HYDROCHLOROTHIAZIDE 10-12.5 MG PO TABS: 1.0000 | ORAL_TABLET | Freq: Every day | ORAL | Status: DC

## 2013-06-07 NOTE — Telephone Encounter (Signed)
Forward to keba 5_1-15 jk

## 2013-06-07 NOTE — Telephone Encounter (Signed)
Medication sent in. 

## 2013-06-19 LAB — HM DIABETES EYE EXAM

## 2013-07-11 ENCOUNTER — Ambulatory Visit (INDEPENDENT_AMBULATORY_CARE_PROVIDER_SITE_OTHER): Payer: Medicare HMO | Admitting: Family Medicine

## 2013-07-11 ENCOUNTER — Encounter: Payer: Self-pay | Admitting: Family Medicine

## 2013-07-11 VITALS — BP 140/80 | HR 60 | Wt 233.0 lb

## 2013-07-11 DIAGNOSIS — E119 Type 2 diabetes mellitus without complications: Secondary | ICD-10-CM

## 2013-07-11 DIAGNOSIS — E785 Hyperlipidemia, unspecified: Secondary | ICD-10-CM

## 2013-07-11 DIAGNOSIS — E1169 Type 2 diabetes mellitus with other specified complication: Secondary | ICD-10-CM

## 2013-07-11 DIAGNOSIS — E1159 Type 2 diabetes mellitus with other circulatory complications: Secondary | ICD-10-CM

## 2013-07-11 DIAGNOSIS — I10 Essential (primary) hypertension: Secondary | ICD-10-CM

## 2013-07-11 DIAGNOSIS — N529 Male erectile dysfunction, unspecified: Secondary | ICD-10-CM

## 2013-07-11 DIAGNOSIS — Z8546 Personal history of malignant neoplasm of prostate: Secondary | ICD-10-CM

## 2013-07-11 DIAGNOSIS — I152 Hypertension secondary to endocrine disorders: Secondary | ICD-10-CM

## 2013-07-11 LAB — POCT GLYCOSYLATED HEMOGLOBIN (HGB A1C): Hemoglobin A1C: 6.3

## 2013-07-11 NOTE — Patient Instructions (Signed)
Check your blood sugars either before a meal or 2 hours after a meal

## 2013-07-11 NOTE — Progress Notes (Signed)
   Subjective:    Patient ID: Shaun Kennedy, male    DOB: Jun 30, 1940, 73 y.o.   MRN: [id number]  HPI He is here for a diabetes check. He does check his sugars usually before meals and they run in the 100s. He continues on medications listed in the chart. He has an eye exam coming up in July. Checks his feet regularly. He exercises 3 days per week and plays golf but other times. Smoking and drinking were reviewed and he continues to use Viagra as needed. He has a history of prostate cancer and now is having yearly surveillance PSA. He has no concerns or complaints.   Review of Systems  All other systems reviewed and are negative.      Objective:   Physical Exam Alert and in no distress. Hemoglobin A1c is 6.3       Assessment & Plan:  Diabetes mellitus - Plan: HgB A1c  History of prostate cancer  ED (erectile dysfunction)  Hypertension associated with diabetes  Hyperlipidemia LDL goal <70  continue on present medications.

## 2013-07-23 ENCOUNTER — Telehealth: Payer: Self-pay | Admitting: Internal Medicine

## 2013-07-23 NOTE — Telephone Encounter (Signed)
Faxed over medical records to [facility] @ [phone] on may 21

## 2013-09-26 ENCOUNTER — Other Ambulatory Visit: Payer: Self-pay | Admitting: Family Medicine

## 2013-12-11 ENCOUNTER — Ambulatory Visit: Payer: Medicare HMO | Admitting: Family Medicine

## 2013-12-27 ENCOUNTER — Other Ambulatory Visit: Payer: Self-pay | Admitting: Family Medicine

## 2013-12-31 ENCOUNTER — Other Ambulatory Visit: Payer: Self-pay | Admitting: Family Medicine

## 2014-01-14 ENCOUNTER — Ambulatory Visit: Payer: Medicare HMO | Admitting: Family Medicine

## 2014-01-24 ENCOUNTER — Encounter: Payer: Self-pay | Admitting: Family Medicine

## 2014-01-24 ENCOUNTER — Ambulatory Visit (INDEPENDENT_AMBULATORY_CARE_PROVIDER_SITE_OTHER): Payer: Medicare HMO | Admitting: Family Medicine

## 2014-01-24 VITALS — BP 140/80 | HR 64 | Wt 232.0 lb

## 2014-01-24 DIAGNOSIS — I10 Essential (primary) hypertension: Secondary | ICD-10-CM

## 2014-01-24 DIAGNOSIS — I152 Hypertension secondary to endocrine disorders: Secondary | ICD-10-CM

## 2014-01-24 DIAGNOSIS — E1169 Type 2 diabetes mellitus with other specified complication: Secondary | ICD-10-CM

## 2014-01-24 DIAGNOSIS — E1159 Type 2 diabetes mellitus with other circulatory complications: Secondary | ICD-10-CM

## 2014-01-24 DIAGNOSIS — E785 Hyperlipidemia, unspecified: Secondary | ICD-10-CM

## 2014-01-24 DIAGNOSIS — E119 Type 2 diabetes mellitus without complications: Secondary | ICD-10-CM

## 2014-01-24 LAB — POCT GLYCOSYLATED HEMOGLOBIN (HGB A1C): Hemoglobin A1C: 6.5

## 2014-01-24 NOTE — Addendum Note (Signed)
Addended by: Randel Books on: 01/24/2014 01:20 PM   Modules accepted: Orders

## 2014-01-24 NOTE — Progress Notes (Signed)
  Subjective:    Shaun Kennedy is a 73 y.o. male who presents for follow-up of Type 2 diabetes mellitus.    Home blood sugar records: patient test bid   Current symptoms/problems include NONE Daily foot checks: Any foot concerns: NONE Last eye exam: may 2015 normal    Medication compliance:good Current diet: None Current exercise: gym 3 to 4 days a week. He continues to work on strengthening his legs especially the right knee in preparation for playing golf this spring Known diabetic complications: none Cardiovascular risk factors: advanced age (older than 1 for men, 39 for women), diabetes mellitus, dyslipidemia, hypertension, male gender and obesity (BMI >= 30 kg/m2)   ROS as in subjective above    Objective:    General appearence: alert, no distress, WD/WN   Lab Review Lab Results  Component Value Date   HGBA1C 6.3 07/11/2013   Lab Results  Component Value Date   CHOL 163 07/06/2012   HDL 69 07/06/2012   LDLCALC 81 07/06/2012   TRIG 66 07/06/2012   CHOLHDL 2.4 07/06/2012   No results found for: Derl Barrow   Chemistry      Component Value Date/Time   NA 140 06/11/2012 1556   K 4.0 06/11/2012 1556   CL 104 06/11/2012 1556   CO2 24 06/11/2012 1556   BUN 14 06/11/2012 1556   CREATININE 1.04 06/11/2012 1556      Component Value Date/Time   CALCIUM 9.2 06/11/2012 1556   ALKPHOS 45 06/11/2012 1556   AST 16 06/11/2012 1556   ALT 15 06/11/2012 1556   BILITOT 0.6 06/11/2012 1556        Chemistry      Component Value Date/Time   NA 140 06/11/2012 1556   K 4.0 06/11/2012 1556   CL 104 06/11/2012 1556   CO2 24 06/11/2012 1556   BUN 14 06/11/2012 1556   CREATININE 1.04 06/11/2012 1556      Component Value Date/Time   CALCIUM 9.2 06/11/2012 1556   ALKPHOS 45 06/11/2012 1556   AST 16 06/11/2012 1556   ALT 15 06/11/2012 1556   BILITOT 0.6 06/11/2012 1556      Hemoglobin A1c is 6.5  Assessment:  Type 2 diabetes mellitus without  complication - Plan: POCT glycosylated hemoglobin (Hb A1C)  Hyperlipidemia associated with type 2 diabetes mellitus  Hypertension associated with diabetes        Plan:    1.  Rx changes: none 2.  Education: Reviewed 'ABCs' of diabetes management (respective goals in parentheses):  A1C (<7), blood pressure (<130/80), and cholesterol (LDL <100). 3.  Compliance at present is estimated to be good. Efforts to improve compliance (if necessary) will be directed at increased exercise. 4. Follow up: 4 months   Encouraged him to continue to take good care of himself.

## 2014-02-23 ENCOUNTER — Other Ambulatory Visit: Payer: Self-pay | Admitting: Family Medicine

## 2014-03-08 ENCOUNTER — Other Ambulatory Visit: Payer: Self-pay | Admitting: Family Medicine

## 2014-03-25 ENCOUNTER — Telehealth: Payer: Self-pay | Admitting: Family Medicine

## 2014-03-25 ENCOUNTER — Other Ambulatory Visit: Payer: Self-pay

## 2014-03-25 MED ORDER — GLUCOSE BLOOD VI STRP: ORAL_STRIP | Status: DC

## 2014-03-25 NOTE — Telephone Encounter (Signed)
Pt called for refill of FREE STYLE TEST STRIPS to a different pharmacy which is [facility] on [address].

## 2014-03-25 NOTE — Telephone Encounter (Signed)
DONE

## 2014-06-05 ENCOUNTER — Other Ambulatory Visit: Payer: Self-pay | Admitting: Family Medicine

## 2014-06-23 LAB — HM DIABETES EYE EXAM

## 2014-06-25 ENCOUNTER — Encounter: Payer: Self-pay | Admitting: Family Medicine

## 2014-06-25 ENCOUNTER — Other Ambulatory Visit: Payer: Self-pay | Admitting: Family Medicine

## 2014-06-25 ENCOUNTER — Ambulatory Visit (INDEPENDENT_AMBULATORY_CARE_PROVIDER_SITE_OTHER): Payer: Medicare HMO | Admitting: Family Medicine

## 2014-06-25 VITALS — BP 150/80 | HR 60 | Wt 231.4 lb

## 2014-06-25 DIAGNOSIS — E785 Hyperlipidemia, unspecified: Secondary | ICD-10-CM

## 2014-06-25 DIAGNOSIS — E1159 Type 2 diabetes mellitus with other circulatory complications: Secondary | ICD-10-CM

## 2014-06-25 DIAGNOSIS — N528 Other male erectile dysfunction: Secondary | ICD-10-CM

## 2014-06-25 DIAGNOSIS — I10 Essential (primary) hypertension: Secondary | ICD-10-CM | POA: Diagnosis not present

## 2014-06-25 DIAGNOSIS — E119 Type 2 diabetes mellitus without complications: Secondary | ICD-10-CM | POA: Diagnosis not present

## 2014-06-25 DIAGNOSIS — E1169 Type 2 diabetes mellitus with other specified complication: Secondary | ICD-10-CM

## 2014-06-25 DIAGNOSIS — I152 Hypertension secondary to endocrine disorders: Secondary | ICD-10-CM

## 2014-06-25 DIAGNOSIS — J309 Allergic rhinitis, unspecified: Secondary | ICD-10-CM

## 2014-06-25 DIAGNOSIS — Z23 Encounter for immunization: Secondary | ICD-10-CM

## 2014-06-25 LAB — CBC WITH DIFFERENTIAL/PLATELET
Basophils Absolute: 0 10*3/uL (ref 0.0–0.1)
Basophils Relative: 0 % (ref 0–1)
EOS PCT: 7 % — AB (ref 0–5)
Eosinophils Absolute: 0.4 10*3/uL (ref 0.0–0.7)
HEMATOCRIT: 37.6 % — AB (ref 39.0–52.0)
HEMOGLOBIN: 12.8 g/dL — AB (ref 13.0–17.0)
LYMPHS ABS: 1.7 10*3/uL (ref 0.7–4.0)
LYMPHS PCT: 34 % (ref 12–46)
MCH: 31.1 pg (ref 26.0–34.0)
MCHC: 34 g/dL (ref 30.0–36.0)
MCV: 91.3 fL (ref 78.0–100.0)
MONOS PCT: 7 % (ref 3–12)
MPV: 10.5 fL (ref 8.6–12.4)
Monocytes Absolute: 0.4 10*3/uL (ref 0.1–1.0)
Neutro Abs: 2.7 10*3/uL (ref 1.7–7.7)
Neutrophils Relative %: 52 % (ref 43–77)
Platelets: 269 10*3/uL (ref 150–400)
RBC: 4.12 MIL/uL — AB (ref 4.22–5.81)
RDW: 13.5 % (ref 11.5–15.5)
WBC: 5.1 10*3/uL (ref 4.0–10.5)

## 2014-06-25 LAB — COMPREHENSIVE METABOLIC PANEL
ALT: 18 U/L (ref 0–53)
AST: 20 U/L (ref 0–37)
Albumin: 4 g/dL (ref 3.5–5.2)
Alkaline Phosphatase: 47 U/L (ref 39–117)
BUN: 17 mg/dL (ref 6–23)
CALCIUM: 9.2 mg/dL (ref 8.4–10.5)
CO2: 27 meq/L (ref 19–32)
CREATININE: 1.05 mg/dL (ref 0.50–1.35)
Chloride: 102 mEq/L (ref 96–112)
GLUCOSE: 109 mg/dL — AB (ref 70–99)
Potassium: 4 mEq/L (ref 3.5–5.3)
Sodium: 137 mEq/L (ref 135–145)
TOTAL PROTEIN: 8 g/dL (ref 6.0–8.3)
Total Bilirubin: 0.8 mg/dL (ref 0.2–1.2)

## 2014-06-25 LAB — LIPID PANEL
Cholesterol: 160 mg/dL (ref 0–200)
HDL: 75 mg/dL (ref >=40)
LDL CALC: 71 mg/dL (ref 0–99)
Total CHOL/HDL Ratio: 2.1 Ratio
Triglycerides: 69 mg/dL (ref <150)
VLDL: 14 mg/dL (ref 0–40)

## 2014-06-25 LAB — POCT GLYCOSYLATED HEMOGLOBIN (HGB A1C): Hemoglobin A1C: 6.6

## 2014-06-25 MED ORDER — LISINOPRIL-HYDROCHLOROTHIAZIDE 20-12.5 MG PO TABS: 1.0000 | ORAL_TABLET | Freq: Every day | ORAL | Status: DC

## 2014-06-25 NOTE — Progress Notes (Signed)
   Subjective:    Patient ID: Shaun Kennedy, male    DOB: Apr 02, 1940, 74 y.o.   MRN: [id number]  HPI He is here for a diabetes recheck. He states his blood sugars usually run below 126. His diet and exercise have been consistent. He does not smoke or drink.He does check his feet regularly. He continues on medications listed in the chart. His allergies are under good control. He does have underlying ADD but states the medicine is not really very effective. He is not interested in pursuing anything further.His last eye exam was May of last year.   Review of Systems     Objective:   Physical Exam Alert and in no distress. Hemoglobin A1c is 6.6       Assessment & Plan:  Hypertension associated with diabetes - Plan: lisinopril-hydrochlorothiazide (ZESTORETIC) 20-12.5 MG per tablet, CBC with Differential/Platelet, Comprehensive metabolic panel  Hyperlipidemia associated with type 2 diabetes mellitus - Plan: Lipid panel  Other male erectile dysfunction  Allergic rhinitis, mild  Type 2 diabetes mellitus without complication - Plan: lisinopril-hydrochlorothiazide (ZESTORETIC) 20-12.5 MG per tablet, CBC with Differential/Platelet, Comprehensive metabolic panel, Lipid panel, POCT UA - Microalbumin, HgB A1c  Need for prophylactic vaccination against Streptococcus pneumoniae (pneumococcus) - Plan: Pneumococcal conjugate vaccine 13-valent I encouraged him to continue to take good care of himself. Did increase his Zestoretic since his blood pressure was not under adequate control.

## 2014-06-26 MED ORDER — SIMVASTATIN 40 MG PO TABS: 40.0000 mg | ORAL_TABLET | Freq: Every day | ORAL | Status: DC

## 2014-06-26 NOTE — Addendum Note (Signed)
Addended by: [name] on: 06/26/2014 10:42 AM   Modules accepted: Orders

## 2014-07-18 ENCOUNTER — Other Ambulatory Visit: Payer: Self-pay | Admitting: Family Medicine

## 2014-10-29 ENCOUNTER — Encounter: Payer: Self-pay | Admitting: Family Medicine

## 2014-10-29 ENCOUNTER — Ambulatory Visit (INDEPENDENT_AMBULATORY_CARE_PROVIDER_SITE_OTHER): Payer: Medicare HMO | Admitting: Family Medicine

## 2014-10-29 VITALS — BP 150/70 | HR 64 | Ht 68.0 in | Wt 233.0 lb

## 2014-10-29 DIAGNOSIS — N528 Other male erectile dysfunction: Secondary | ICD-10-CM | POA: Diagnosis not present

## 2014-10-29 DIAGNOSIS — E1169 Type 2 diabetes mellitus with other specified complication: Secondary | ICD-10-CM

## 2014-10-29 DIAGNOSIS — Z8546 Personal history of malignant neoplasm of prostate: Secondary | ICD-10-CM | POA: Diagnosis not present

## 2014-10-29 DIAGNOSIS — Z136 Encounter for screening for cardiovascular disorders: Secondary | ICD-10-CM

## 2014-10-29 DIAGNOSIS — I10 Essential (primary) hypertension: Secondary | ICD-10-CM | POA: Diagnosis not present

## 2014-10-29 DIAGNOSIS — E785 Hyperlipidemia, unspecified: Secondary | ICD-10-CM

## 2014-10-29 DIAGNOSIS — I152 Hypertension secondary to endocrine disorders: Secondary | ICD-10-CM

## 2014-10-29 DIAGNOSIS — E119 Type 2 diabetes mellitus without complications: Secondary | ICD-10-CM | POA: Diagnosis not present

## 2014-10-29 DIAGNOSIS — E1159 Type 2 diabetes mellitus with other circulatory complications: Secondary | ICD-10-CM

## 2014-10-29 LAB — POCT GLYCOSYLATED HEMOGLOBIN (HGB A1C): HEMOGLOBIN A1C: 6.8

## 2014-10-29 NOTE — Progress Notes (Signed)
  Subjective:    Patient ID: Shaun Kennedy, male    DOB: 15-Jan-1941, 75 y.o.   MRN: [id number]  Shaun Kennedy is a 74 y.o. male who presents for follow-up of Type 2 diabetes mellitus.  Home blood sugar records: PATIENT TEST BID Current symptoms/problems NONE Daily foot checks:YES   Any foot concerns: NONE [place] [facility] 06[place]2014 NORMAL Himself quite physically active. Life is going quite well for him. He has had no history of falls or evidence of depression.He does have a previous history of prostate cancer and ED. He is not interested in any ED medication.He quit smoking several years ago. The following portions of the patient's history were reviewed and updated as appropriate: allergies, current medications, past medical history, past social history and problem list.  ROS as in subjective above.     Objective:    Physical Exam Alert and in no distress otherwise not examined.   Lab Review Diabetic Labs Latest Ref Rng 06/25/2014 01/24/2014 07/11/2013 03/12/2013 11/09/2012  HbA1c - 6.6% 6.5 6.3 6.4 6.5  Chol 0 - 200 mg/dL 160 - - - -  HDL >=40 mg/dL 75 - - - -  Calc LDL 0 - 99 mg/dL 71 - - - -  Triglycerides <150 mg/dL 69 - - - -  Creatinine 0.50 - 1.35 mg/dL 1.05 - - - -   BP/Weight 06/25/2014 01/24/2014 07/11/2013 03/12/2013 33/07/1222  Systolic BP 497 530 051 102 111  Diastolic BP 80 80 80 84 70  Wt. (Lbs) 231.4 232 233 236 -  BMI 35.19 35.28 35.44 35.89 -   Foot/eye exam completion dates Latest Ref Rng 06/19/2013 07/06/2012  Eye Exam No Retinopathy No Retinopathy -  Foot Form Completion - - Done  IMA globin A1c is 6.8  Shaun Kennedy  reports that he quit smoking about 27 years ago. He has never used smokeless tobacco. He reports that he does not drink alcohol or use illicit drugs.     Assessment & Plan:    Screening for AAA (abdominal aortic aneurysm)  Hypertension associated with diabetes  Hyperlipidemia associated with type 2 diabetes mellitus  Other male  erectile dysfunction  Type 2 diabetes mellitus without complication  History of prostate cancer    1. Rx changes: none 2. Education: Reviewed 'ABCs' of diabetes management (respective goals in parentheses):  A1C (<7), blood pressure (<130/80), and cholesterol (LDL <100). 3. Compliance at present is estimated to be good. Efforts to improve compliance (if necessary) will be directed at Continuing with his physical activity. 4. Follow up: 4 months

## 2014-11-05 ENCOUNTER — Ambulatory Visit
Admission: RE | Admit: 2014-11-05 | Discharge: 2014-11-05 | Disposition: A | Discharge status: Home / Self Care | Payer: Medicare HMO | Source: Ambulatory Visit | Attending: Family Medicine | Admitting: Family Medicine

## 2014-11-05 DIAGNOSIS — Z136 Encounter for screening for cardiovascular disorders: Secondary | ICD-10-CM

## 2015-02-04 ENCOUNTER — Ambulatory Visit
Admission: RE | Admit: 2015-02-04 | Discharge: 2015-02-04 | Disposition: A | Discharge status: Home / Self Care | Payer: Medicare HMO | Source: Ambulatory Visit | Attending: Family Medicine | Admitting: Family Medicine

## 2015-02-04 ENCOUNTER — Encounter: Payer: Self-pay | Admitting: Family Medicine

## 2015-02-04 ENCOUNTER — Ambulatory Visit (INDEPENDENT_AMBULATORY_CARE_PROVIDER_SITE_OTHER): Payer: Medicare HMO | Admitting: Family Medicine

## 2015-02-04 VITALS — BP 128/68 | HR 62 | Resp 12 | Wt 228.6 lb

## 2015-02-04 DIAGNOSIS — J209 Acute bronchitis, unspecified: Secondary | ICD-10-CM

## 2015-02-04 DIAGNOSIS — R05 Cough: Secondary | ICD-10-CM

## 2015-02-04 DIAGNOSIS — R059 Cough, unspecified: Secondary | ICD-10-CM

## 2015-02-04 MED ORDER — AZITHROMYCIN 500 MG PO TABS: 500.0000 mg | ORAL_TABLET | Freq: Every day | ORAL | Status: DC

## 2015-02-04 NOTE — Progress Notes (Signed)
   Subjective:    Patient ID: [name], male    DOB: 27-Aug-1940, 74 y.o.   MRN: [id number]  HPI He complains of a 4 day history of a dry cough but no sore throat, earache, fever, chills nasal congestion, rhinorrhea, PND or shortness of breath, malaise. He however did state that he had to leave the golf course yesterday due to the dry cough.   Review of Systems     Objective:   Physical Exam Alert and in no distress. Tympanic membranes and canals are normal. Pharyngeal area is normal. Neck is supple without adenopathy or thyromegaly. Cardiac exam shows a regular sinus rhythm without murmurs or gallops. Lungs show rales mainly in the right lower lung base. X-ray showed evidence of bronchitis.       Assessment & Plan:  Cough - Plan: DG Chest 2 View, azithromycin (ZITHROMAX) 500 MG tablet  Acute bronchitis, unspecified organism - Plan: azithromycin (ZITHROMAX) 500 MG tablet  a call if not entirely better when he finishes the antibiotic.

## 2015-02-11 ENCOUNTER — Telehealth: Payer: Self-pay | Admitting: Family Medicine

## 2015-02-11 DIAGNOSIS — I10 Essential (primary) hypertension: Principal | ICD-10-CM

## 2015-02-11 DIAGNOSIS — I152 Hypertension secondary to endocrine disorders: Secondary | ICD-10-CM

## 2015-02-11 DIAGNOSIS — E119 Type 2 diabetes mellitus without complications: Secondary | ICD-10-CM

## 2015-02-11 DIAGNOSIS — E1159 Type 2 diabetes mellitus with other circulatory complications: Secondary | ICD-10-CM

## 2015-02-11 MED ORDER — METFORMIN HCL ER 500 MG PO TB24: ORAL_TABLET | ORAL | Status: DC

## 2015-02-11 MED ORDER — LISINOPRIL-HYDROCHLOROTHIAZIDE 20-12.5 MG PO TABS: 1.0000 | ORAL_TABLET | Freq: Every day | ORAL | Status: DC

## 2015-02-11 MED ORDER — SIMVASTATIN 40 MG PO TABS: 40.0000 mg | ORAL_TABLET | Freq: Every day | ORAL | Status: DC

## 2015-02-11 MED ORDER — GLUCOSE BLOOD VI STRP: ORAL_STRIP | Status: DC

## 2015-02-11 NOTE — Telephone Encounter (Signed)
Refilled meds

## 2015-02-11 NOTE — Telephone Encounter (Signed)
[facility] req Lisinopril-HCTZ 10-12.5 mg tabs,  Metformin HCL 500 MG Tab, Simvastatin 40 mg tab, Accu-check Aviva Plus Meter, Accu-check Aviva Plus Test Strip, Accu-check Soft clix Lancets refills.

## 2015-02-16 ENCOUNTER — Telehealth: Payer: Self-pay | Admitting: Internal Medicine

## 2015-02-16 MED ORDER — ACCU-CHEK SOFTCLIX LANCET DEV MISC
Status: DC
Start: 2015-02-16 — End: 2015-08-06

## 2015-02-16 MED ORDER — ACCU-CHEK AVIVA DEVI: Status: AC

## 2015-02-16 MED ORDER — GLUCOSE BLOOD VI STRP
ORAL_STRIP | Status: DC
Start: 2015-02-16 — End: 2015-08-06

## 2015-02-16 MED ORDER — ACCU-CHEK AVIVA VI SOLN: Status: DC

## 2015-02-16 NOTE — Telephone Encounter (Signed)
Refill request for accu-check test strips, lancets, meter, solution to [facility]

## 2015-02-25 ENCOUNTER — Telehealth: Payer: Self-pay

## 2015-02-25 NOTE — Telephone Encounter (Signed)
From now on he wants to use the [facility] for all of his med refills.

## 2015-03-02 ENCOUNTER — Telehealth: Payer: Self-pay

## 2015-03-02 DIAGNOSIS — I152 Hypertension secondary to endocrine disorders: Secondary | ICD-10-CM

## 2015-03-02 DIAGNOSIS — I10 Essential (primary) hypertension: Principal | ICD-10-CM

## 2015-03-02 DIAGNOSIS — E119 Type 2 diabetes mellitus without complications: Secondary | ICD-10-CM

## 2015-03-02 DIAGNOSIS — E1159 Type 2 diabetes mellitus with other circulatory complications: Secondary | ICD-10-CM

## 2015-03-02 MED ORDER — METFORMIN HCL ER 500 MG PO TB24: ORAL_TABLET | ORAL | Status: DC

## 2015-03-02 MED ORDER — LISINOPRIL-HYDROCHLOROTHIAZIDE 20-12.5 MG PO TABS: 1.0000 | ORAL_TABLET | Freq: Every day | ORAL | Status: DC

## 2015-03-02 MED ORDER — SIMVASTATIN 40 MG PO TABS: 40.0000 mg | ORAL_TABLET | Freq: Every day | ORAL | Status: DC

## 2015-03-02 NOTE — Telephone Encounter (Signed)
Pt called saying he would like his Metformin '500mg'$ , Lisinopril-hctz 20-12.'5mg'$ , and Simvastatin '40mg'$  resent to [place] so he can have the 90 day supply. He has [facility] now and wants to use this pharmacy.

## 2015-03-04 ENCOUNTER — Telehealth: Payer: Self-pay | Admitting: Family Medicine

## 2015-03-04 NOTE — Telephone Encounter (Signed)
Pt is seeing Dr. Leticia Clas NPI- [id number] Dx code- E11.9 [id number]  [place] # [phone]

## 2015-03-04 NOTE — Telephone Encounter (Signed)
Pt called requesting a referral for [facility] [address] said his other eye dr retired and he made a appt for feb1st and they said he needed a referral from his primary dr before they would see him. There number is [phone] and his number is [phone] to let him know  when it is done.

## 2015-03-11 DIAGNOSIS — H35032 Hypertensive retinopathy, left eye: Secondary | ICD-10-CM | POA: Diagnosis not present

## 2015-03-11 DIAGNOSIS — H25013 Cortical age-related cataract, bilateral: Secondary | ICD-10-CM | POA: Diagnosis not present

## 2015-03-11 DIAGNOSIS — H2513 Age-related nuclear cataract, bilateral: Secondary | ICD-10-CM | POA: Diagnosis not present

## 2015-03-11 DIAGNOSIS — H35031 Hypertensive retinopathy, right eye: Secondary | ICD-10-CM | POA: Diagnosis not present

## 2015-03-11 DIAGNOSIS — E119 Type 2 diabetes mellitus without complications: Secondary | ICD-10-CM | POA: Diagnosis not present

## 2015-03-11 LAB — HM DIABETES EYE EXAM

## 2015-03-16 ENCOUNTER — Encounter: Payer: Self-pay | Admitting: Family Medicine

## 2015-07-08 ENCOUNTER — Ambulatory Visit (INDEPENDENT_AMBULATORY_CARE_PROVIDER_SITE_OTHER): Payer: Commercial Managed Care - HMO | Admitting: Family Medicine

## 2015-07-08 ENCOUNTER — Encounter: Payer: Self-pay | Admitting: Family Medicine

## 2015-07-08 VITALS — BP 148/94 | HR 60 | Temp 97.3°F | Ht 67.5 in | Wt 230.4 lb

## 2015-07-08 DIAGNOSIS — L03319 Cellulitis of trunk, unspecified: Secondary | ICD-10-CM

## 2015-07-08 DIAGNOSIS — L02219 Cutaneous abscess of trunk, unspecified: Secondary | ICD-10-CM

## 2015-07-08 MED ORDER — DOXYCYCLINE HYCLATE 100 MG PO TABS: 100.0000 mg | ORAL_TABLET | Freq: Two times a day (BID) | ORAL | Status: DC

## 2015-07-08 NOTE — Progress Notes (Signed)
Chief Complaint  Patient presents with  . Rash    red, itchy rash x 6 days on his abdomen and back.   7-8 days ago he started itching on his chest, between his nipples, then started itching over the left chest area, felt a little swollen.  The itching then spread to his back (left side). Pharmacist told him to use a topical medication (he doesn't recall name, doesn't know if it was a cortisone cream), which didn't help.  3-4 days ago he developed painful areas on the left back.  Sugars 112, up to 139 if eating out, no recent changes. Denies polydipsia, polyuria.  PMH, PSH, SH reviewed. He is past due for med check with Dr. Redmond School (>6 months) Outpatient Encounter Prescriptions as of 07/08/2015  Medication Sig  . aspirin 81 MG tablet Take 81 mg by mouth daily.  . Blood Glucose Calibration (ACCU-CHEK AVIVA) SOLN Use as directed  . Blood Glucose Monitoring Suppl (ACCU-CHEK AVIVA) device Test twice a day  . glucose blood test strip Test twice a day. Use for accu-check meter  . Lancet Devices (ACCU-CHEK SOFTCLIX) lancets Test twice a day  . lisinopril-hydrochlorothiazide (ZESTORETIC) 20-12.5 MG tablet Take 1 tablet by mouth daily.  . metFORMIN (GLUCOPHAGE-XR) 500 MG 24 hr tablet TAKE ONE TABLET BY MOUTH ONCE DAILY WITH BREAKFAST  . simvastatin (ZOCOR) 40 MG tablet Take 1 tablet (40 mg total) by mouth daily.  Marland Kitchen VITAMIN D, CHOLECALCIFEROL, PO Take 500 Units by mouth.  . [DISCONTINUED] azithromycin (ZITHROMAX) 500 MG tablet Take 1 tablet (500 mg total) by mouth daily.  . [DISCONTINUED] sildenafil (VIAGRA) 100 MG tablet Take 0.5-1 tablets (50-100 mg total) by mouth daily as needed for erectile dysfunction.   No facility-administered encounter medications on file as of 07/08/2015.   No Known Allergies  ROS: Denies fever, chills, nausea, vomiting.  Denies tick bites.  Denies headaches, dizziness, chest pain.  He feels a little warmer than usual (wife says she is cold, he is hot). Denies myalgias,  joint pains. No bruising, bleeding. NO bowel changes.  PHYSICAL EXAM:  BP 148/94 mmHg  Pulse 60  Temp(Src) 97.3 F (36.3 C) (Tympanic)  Ht 5' 7.5" (1.715 m)  Wt 230 lb 6.4 oz (104.509 kg)  BMI 35.53 kg/m2    Well appearing, pleasant male in no distress Chest: there is some erythema at the skin fold, horizontally in the central chest.  No other rash is evident--no pustules, papules, or any visible lesions over the left chest where he describes the itching. No axillary lymphadenopathy. No breast/chest mass Heart: regular rate and rhythm Lungs: clear bilaterally  Back: there are 2 areas that are red, swollen and indurated.  No fluctuance. The superiormost one measures 6x4cm The one below this is less indurated, and measures 4x2 cm +tender to palpation    ASSESSMENT/PLAN:  Cellulitis and abscess of trunk - Plan: doxycycline (VIBRA-TABS) 100 MG tablet   Abscess/cellulitis x 2 on back.  Given that there are two, cannot rule out spider bite. He is a diabetic, and will treat for abscess and cover for MRSA. Advised to apply hot compresses, and return in a few days if increasing swelling/pain--may need to be lanced.  Is NOT to be lanced today, too early in course.  Treat with doxycycline BID.  Rash on front of chest--consider heat related, mild intertrigo. Treat with medicated powder and/or antifungal cream OTC  Risks of doxy reviewed, need for sunscreen.  Schedule med check with Dr. Redmond School.    Keep  the front of the chest very dry. Use cornstarch or medicated powder. If the area is still itchy and red, try using an athlete's foot cream such as clotrimazole or lamisil twice daily. Use claritin or zyrtec once daily if needed for itching.  The painful areas on your back appear to be early abscesses.  Take the antibiotics twice daily for 10 days. Apply warm compresses (hot showers, hot towels) at least 2- 3 times daily. Return in a few days if the swelling and pain is  worsening--the abscess may need to be drained.  Avoid being in the sun while on the antibiotic--use sunscreen if you must be outside.

## 2015-07-08 NOTE — Patient Instructions (Signed)
Keep the front of the chest very dry. Use cornstarch or medicated powder. If the area is still itchy and red, try using an athlete's foot cream such as clotrimazole or lamisil twice daily. Use claritin or zyrtec once daily if needed for itching.  The painful areas on your back appear to be early abscesses.  Take the antibiotics twice daily for 10 days. Apply warm compresses (hot showers, hot towels) at least 2- 3 times daily. Return in a few days if the swelling and pain is worsening--the abscess may need to be drained.  Avoid being in the sun while on the antibiotic--use sunscreen if you must be outside.   Abscess An abscess is an infected area that contains a collection of pus and debris.It can occur in almost any part of the body. An abscess is also known as a furuncle or boil. CAUSES  An abscess occurs when tissue gets infected. This can occur from blockage of oil or sweat glands, infection of hair follicles, or a minor injury to the skin. As the body tries to fight the infection, pus collects in the area and creates pressure under the skin. This pressure causes pain. People with weakened immune systems have difficulty fighting infections and get certain abscesses more often.  SYMPTOMS Usually an abscess develops on the skin and becomes a painful mass that is red, warm, and tender. If the abscess forms under the skin, you may feel a moveable soft area under the skin. Some abscesses break open (rupture) on their own, but most will continue to get worse without care. The infection can spread deeper into the body and eventually into the bloodstream, causing you to feel ill.  DIAGNOSIS  Your caregiver will take your medical history and perform a physical exam. A sample of fluid may also be taken from the abscess to determine what is causing your infection. TREATMENT  Your caregiver may prescribe antibiotic medicines to fight the infection. However, taking antibiotics alone usually does not  cure an abscess. Your caregiver may need to make a small cut (incision) in the abscess to drain the pus. In some cases, gauze is packed into the abscess to reduce pain and to continue draining the area. HOME CARE INSTRUCTIONS   Only take over-the-counter or prescription medicines for pain, discomfort, or fever as directed by your caregiver.  If you were prescribed antibiotics, take them as directed. Finish them even if you start to feel better.  If gauze is used, follow your caregiver's directions for changing the gauze.  To avoid spreading the infection:  Keep your draining abscess covered with a bandage.  Wash your hands well.  Do not share personal care items, towels, or whirlpools with others.  Avoid skin contact with others.  Keep your skin and clothes clean around the abscess.  Keep all follow-up appointments as directed by your caregiver. SEEK MEDICAL CARE IF:   You have increased pain, swelling, redness, fluid drainage, or bleeding.  You have muscle aches, chills, or a general ill feeling.  You have a fever. MAKE SURE YOU:   Understand these instructions.  Will watch your condition.  Will get help right away if you are not doing well or get worse.   This information is not intended to replace advice given to you by your health care provider. Make sure you discuss any questions you have with your health care provider.   Document Released: 11/03/2004 Document Revised: 07/26/2011 Document Reviewed: 04/08/2011 Elsevier Interactive Patient Education [employer].

## 2015-07-22 ENCOUNTER — Ambulatory Visit (INDEPENDENT_AMBULATORY_CARE_PROVIDER_SITE_OTHER): Payer: Commercial Managed Care - HMO | Admitting: Family Medicine

## 2015-07-22 ENCOUNTER — Encounter: Payer: Self-pay | Admitting: Family Medicine

## 2015-07-22 VITALS — BP 128/70 | HR 60 | Ht 68.0 in | Wt 231.0 lb

## 2015-07-22 DIAGNOSIS — Z8546 Personal history of malignant neoplasm of prostate: Secondary | ICD-10-CM

## 2015-07-22 DIAGNOSIS — E1169 Type 2 diabetes mellitus with other specified complication: Secondary | ICD-10-CM

## 2015-07-22 DIAGNOSIS — E1159 Type 2 diabetes mellitus with other circulatory complications: Secondary | ICD-10-CM

## 2015-07-22 DIAGNOSIS — I152 Hypertension secondary to endocrine disorders: Secondary | ICD-10-CM

## 2015-07-22 DIAGNOSIS — Z87891 Personal history of nicotine dependence: Secondary | ICD-10-CM | POA: Diagnosis not present

## 2015-07-22 DIAGNOSIS — I10 Essential (primary) hypertension: Secondary | ICD-10-CM

## 2015-07-22 DIAGNOSIS — B351 Tinea unguium: Secondary | ICD-10-CM | POA: Diagnosis not present

## 2015-07-22 DIAGNOSIS — E785 Hyperlipidemia, unspecified: Secondary | ICD-10-CM

## 2015-07-22 DIAGNOSIS — J309 Allergic rhinitis, unspecified: Secondary | ICD-10-CM | POA: Diagnosis not present

## 2015-07-22 DIAGNOSIS — E119 Type 2 diabetes mellitus without complications: Secondary | ICD-10-CM | POA: Diagnosis not present

## 2015-07-22 LAB — CBC WITH DIFFERENTIAL/PLATELET
BASOS ABS: 0 {cells}/uL (ref 0–200)
Basophils Relative: 0 %
EOS PCT: 7 %
Eosinophils Absolute: 371 cells/uL (ref 15–500)
HCT: 35.7 % — ABNORMAL LOW (ref 38.5–50.0)
Hemoglobin: 12.1 g/dL — ABNORMAL LOW (ref 13.2–17.1)
LYMPHS ABS: 1749 {cells}/uL (ref 850–3900)
LYMPHS PCT: 33 %
MCH: 31.3 pg (ref 27.0–33.0)
MCHC: 33.9 g/dL (ref 32.0–36.0)
MCV: 92.2 fL (ref 80.0–100.0)
MONOS PCT: 7 %
MPV: 10.9 fL (ref 7.5–12.5)
Monocytes Absolute: 371 cells/uL (ref 200–950)
NEUTROS PCT: 53 %
Neutro Abs: 2809 cells/uL (ref 1500–7800)
PLATELETS: 237 10*3/uL (ref 140–400)
RBC: 3.87 MIL/uL — AB (ref 4.20–5.80)
RDW: 13.5 % (ref 11.0–15.0)
WBC: 5.3 10*3/uL (ref 4.0–10.5)

## 2015-07-22 LAB — POCT GLYCOSYLATED HEMOGLOBIN (HGB A1C): Hemoglobin A1C: 6.5

## 2015-07-22 NOTE — Progress Notes (Signed)
  Subjective:    Patient ID: Shaun Kennedy, male    DOB: 05/30/1940, 75 y.o.   MRN: [id number]  Shaun Kennedy is a 75 y.o. male who presents for follow-up of Type 2 diabetes mellitus.  Patient is checking home blood sugars.   Home blood sugar records: high 143 low 91 How often is blood sugars being checked: once a day Current symptoms/problems none Daily foot checks: yes   Any foot concerns:none Last eye exam: 03/11/15 Exercise: goes to y twice a week   The following portions of the patient's history were reviewed and updated as appropriate: allergies, current medications, past medical history, past social history and problem list.  ROS as in subjective above.     Objective:    Physical Exam Alert and in no distress otherwise not examined.    Lab Review Diabetic Labs Latest Ref Rng 10/29/2014 06/25/2014 01/24/2014 07/11/2013 03/12/2013  HbA1c - 6.8 6.6% 6.5 6.3 6.4  Chol 0 - 200 mg/dL - 160 - - -  HDL >=40 mg/dL - 75 - - -  Calc LDL 0 - 99 mg/dL - 71 - - -  Triglycerides <150 mg/dL - 69 - - -  Creatinine 0.50 - 1.35 mg/dL - 1.05 - - -   BP/Weight 07/08/2015 02/04/2015 10/29/2014 06/25/2014 14/78/2956  Systolic BP 213 086 578 469 629  Diastolic BP 94 68 70 80 80  Wt. (Lbs) 230.4 228.6 233 231.4 232  BMI 35.53 34.77 35.44 35.19 35.28   Foot/eye exam completion dates Latest Ref Rng 03/11/2015 06/23/2014  Eye Exam No Retinopathy No Retinopathy No Retinopathy  Foot Form Completion - - -  Full exam done and thickening of the nails is noted. A1c is 6.5  Shaun Kennedy  reports that he quit smoking about 27 years ago. He has never used smokeless tobacco. He reports that he does not drink alcohol or use illicit drugs.     Assessment & Plan:    Type 2 diabetes mellitus without complication, without long-term current use of insulin (HCC) - Plan: POCT glycosylated hemoglobin (Hb A1C), CBC with Differential/Platelet, Comprehensive metabolic panel, Lipid panel, POCT UA - Microalbumin  Allergic  rhinitis, mild  History of prostate cancer  Hypertension associated with diabetes ([place]) - Plan: CBC with Differential/Platelet, Comprehensive metabolic panel  Hyperlipidemia associated with type 2 diabetes mellitus ([place]) - Plan: Lipid panel  Onychomycosis of multiple toenails with type 2 diabetes mellitus ([place])     1. Rx changes: none 2. Education: Reviewed 'ABCs' of diabetes management (respective goals in parentheses):  A1C (<7), blood pressure (<130/80), and cholesterol (LDL <100). 3. Compliance at present is estimated to be good. Efforts to improve compliance (if necessary) will be directed at increased exercise. 4. Follow up: 4 months no intervention for the onychomycosis. He. We did have a shingles vaccine and we will adequately document that.

## 2015-07-23 LAB — COMPREHENSIVE METABOLIC PANEL
ALT: 16 U/L (ref 9–46)
AST: 19 U/L (ref 10–35)
Albumin: 3.9 g/dL (ref 3.6–5.1)
Alkaline Phosphatase: 47 U/L (ref 40–115)
BUN: 18 mg/dL (ref 7–25)
CALCIUM: 9 mg/dL (ref 8.6–10.3)
CO2: 17 mmol/L — AB (ref 20–31)
Chloride: 106 mmol/L (ref 98–110)
Creat: 1.14 mg/dL (ref 0.70–1.18)
GLUCOSE: 137 mg/dL — AB (ref 65–99)
POTASSIUM: 3.8 mmol/L (ref 3.5–5.3)
Sodium: 141 mmol/L (ref 135–146)
Total Bilirubin: 0.9 mg/dL (ref 0.2–1.2)
Total Protein: 7.6 g/dL (ref 6.1–8.1)

## 2015-07-23 LAB — LIPID PANEL
CHOL/HDL RATIO: 1.8 ratio (ref <=5.0)
Cholesterol: 147 mg/dL (ref 125–200)
HDL: 83 mg/dL (ref >=40)
LDL CALC: 49 mg/dL (ref <130)
Triglycerides: 75 mg/dL (ref <150)
VLDL: 15 mg/dL (ref <30)

## 2015-08-06 ENCOUNTER — Telehealth: Payer: Self-pay

## 2015-08-06 ENCOUNTER — Other Ambulatory Visit: Payer: Self-pay

## 2015-08-06 MED ORDER — GLUCOSE BLOOD VI STRP: ORAL_STRIP | Status: DC

## 2015-08-06 MED ORDER — ACCU-CHEK SOFTCLIX LANCET DEV MISC: Status: DC

## 2015-08-06 NOTE — Telephone Encounter (Signed)
Sent in refill to [facility]

## 2015-08-06 NOTE — Telephone Encounter (Signed)
Fax rcvd for script of accu chek fast clik lancets and aviva test strips to [facility]. [place] December

## 2015-09-16 ENCOUNTER — Telehealth: Payer: Self-pay | Admitting: Family Medicine

## 2015-09-16 NOTE — Telephone Encounter (Signed)
Rcvd refill request for Metformin 500 mg #90

## 2015-09-17 ENCOUNTER — Other Ambulatory Visit: Payer: Self-pay

## 2015-09-17 MED ORDER — METFORMIN HCL ER 500 MG PO TB24: ORAL_TABLET | ORAL | 0 refills | Status: DC

## 2015-09-17 NOTE — Telephone Encounter (Signed)
Sent metformin in

## 2015-09-23 ENCOUNTER — Telehealth: Payer: Self-pay | Admitting: Family Medicine

## 2015-09-23 DIAGNOSIS — H40013 Open angle with borderline findings, low risk, bilateral: Secondary | ICD-10-CM | POA: Diagnosis not present

## 2015-09-23 DIAGNOSIS — H2513 Age-related nuclear cataract, bilateral: Secondary | ICD-10-CM | POA: Diagnosis not present

## 2015-09-23 DIAGNOSIS — H25013 Cortical age-related cataract, bilateral: Secondary | ICD-10-CM | POA: Diagnosis not present

## 2015-09-23 DIAGNOSIS — H18461 Peripheral corneal degeneration, right eye: Secondary | ICD-10-CM | POA: Diagnosis not present

## 2015-09-23 NOTE — Telephone Encounter (Signed)
AUTH # [id number] GOOD FOR 6 VISITS FROM 09/23/15

## 2015-09-23 NOTE — Telephone Encounter (Signed)
Pt called and states he is at the eye doctor and they need an updated referral today.  He is seeing Dr. Marshall Cork NPI [id number] Dx [phone]  [place] Phone number for Dr. Kathlen Mody [phone]

## 2015-10-28 DIAGNOSIS — H25013 Cortical age-related cataract, bilateral: Secondary | ICD-10-CM | POA: Diagnosis not present

## 2015-10-28 DIAGNOSIS — H18893 Other specified disorders of cornea, bilateral: Secondary | ICD-10-CM | POA: Diagnosis not present

## 2015-10-28 DIAGNOSIS — H2511 Age-related nuclear cataract, right eye: Secondary | ICD-10-CM | POA: Diagnosis not present

## 2015-10-28 DIAGNOSIS — H40013 Open angle with borderline findings, low risk, bilateral: Secondary | ICD-10-CM | POA: Diagnosis not present

## 2015-10-28 DIAGNOSIS — H2513 Age-related nuclear cataract, bilateral: Secondary | ICD-10-CM | POA: Diagnosis not present

## 2015-12-02 ENCOUNTER — Ambulatory Visit (INDEPENDENT_AMBULATORY_CARE_PROVIDER_SITE_OTHER): Payer: Commercial Managed Care - HMO | Admitting: Family Medicine

## 2015-12-02 ENCOUNTER — Encounter: Payer: Self-pay | Admitting: Family Medicine

## 2015-12-02 VITALS — BP 130/70 | HR 56 | Ht 68.0 in | Wt 230.6 lb

## 2015-12-02 DIAGNOSIS — N3941 Urge incontinence: Secondary | ICD-10-CM

## 2015-12-02 DIAGNOSIS — I10 Essential (primary) hypertension: Secondary | ICD-10-CM | POA: Diagnosis not present

## 2015-12-02 DIAGNOSIS — E1169 Type 2 diabetes mellitus with other specified complication: Secondary | ICD-10-CM | POA: Diagnosis not present

## 2015-12-02 DIAGNOSIS — I152 Hypertension secondary to endocrine disorders: Secondary | ICD-10-CM

## 2015-12-02 DIAGNOSIS — E119 Type 2 diabetes mellitus without complications: Secondary | ICD-10-CM

## 2015-12-02 DIAGNOSIS — E1136 Type 2 diabetes mellitus with diabetic cataract: Secondary | ICD-10-CM | POA: Insufficient documentation

## 2015-12-02 DIAGNOSIS — E785 Hyperlipidemia, unspecified: Secondary | ICD-10-CM | POA: Diagnosis not present

## 2015-12-02 DIAGNOSIS — Z23 Encounter for immunization: Secondary | ICD-10-CM

## 2015-12-02 DIAGNOSIS — E1159 Type 2 diabetes mellitus with other circulatory complications: Secondary | ICD-10-CM | POA: Diagnosis not present

## 2015-12-02 DIAGNOSIS — Z8546 Personal history of malignant neoplasm of prostate: Secondary | ICD-10-CM | POA: Diagnosis not present

## 2015-12-02 LAB — POCT GLYCOSYLATED HEMOGLOBIN (HGB A1C): HEMOGLOBIN A1C: 6.9

## 2015-12-02 MED ORDER — MIRABEGRON ER 25 MG PO TB24: 25.0000 mg | ORAL_TABLET | Freq: Every day | ORAL | 0 refills | Status: DC

## 2015-12-02 NOTE — Progress Notes (Signed)
Subjective:    Patient ID: Shaun Kennedy, male    DOB: 13-Apr-1940, 75 y.o.   MRN: [id number]  Shaun Kennedy is a 75 y.o. male who presents for follow-up of Type 2 diabetes mellitus.  Patient is checking home blood sugars.   Home blood sugar records: blood sugar going up. In the last month he has noted wide fluctuations and thinks it could be his glucometer. How often is blood sugars being checked: BID Current symptoms/problems none Daily foot checks: yes   Any foot concerns: none Last eye exam: 03/11/15 Exercise: 3 x a week at gym. He also plays golf 3 days per week as well He is also noted some difficulty with urinary urgency and occasional incontinence over the last several months. He has a previous history of prostate cancer with seeds. This was diagnosed in 2004. He also has a history of cataracts and is scheduled for surgery on the right eye and the near future. He continues on his lisinopril and simvastatin without difficulty. He also takes metformin. The following portions of the patient's history were reviewed and updated as appropriate: allergies, current medications, past medical history, past social history and problem list.  ROS as in subjective above.     Objective:    Physical Exam Alert and in no distress otherwise not examined.   Lab Review Diabetic Labs Latest Ref Rng & Units 12/02/2015 07/22/2015 10/29/2014 06/25/2014 01/24/2014  HbA1c - 6.9 6.5 6.8 6.6% 6.5  Microalbumin mg/dL - - - - -  Micro/Creat Ratio - - - - - -  Chol 125 - 200 mg/dL - 147 - 160 -  HDL >=40 mg/dL - 83 - 75 -  Calc LDL <130 mg/dL - 49 - 71 -  Triglycerides <150 mg/dL - 75 - 69 -  Creatinine 0.70 - 1.18 mg/dL - 1.14 - 1.05 -   BP/Weight 12/02/2015 07/22/2015 07/08/2015 02/04/2015 08/10/5007  Systolic BP 381 829 937 169 678  Diastolic BP 70 70 94 68 70  Wt. (Lbs) 230.6 231 230.4 228.6 233  BMI 35.06 35.13 35.53 34.77 35.44   Foot/eye exam completion dates Latest Ref Rng & Units 07/22/2015  03/11/2015  Eye Exam No Retinopathy - No Retinopathy  Foot Form Completion - Done -  Trejan  reports that he quit smoking about 28 years ago. He has never used smokeless tobacco. He reports that he does not drink alcohol or use drugs.    A1c is 6.9  Assessment & Plan:    Type 2 diabetes mellitus without complication, without long-term current use of insulin (HCC) - Plan: HgB A1c  Hypertension associated with diabetes ([place])  Hyperlipidemia associated with type 2 diabetes mellitus ([place])  Cataract associated with type 2 diabetes mellitus ([place])  Urge incontinence - Plan: mirabegron ER (MYRBETRIQ) 25 MG TB24 tablet  Need for prophylactic vaccination and inoculation against influenza  History of prostate cancer    1. Rx changes: I will try him on Myrbetriq 2. Education: Reviewed 'ABCs' of diabetes management (respective goals in parentheses):  A1C (<7), blood pressure (<130/80), and cholesterol (LDL <100). 3. Compliance at present is estimated to be good. Efforts to improve compliance (if necessary) will be directed at No change. 4. Follow up: 4 months since he has not really changed his eating habits or activities it's difficult to say was going on with his blood sugar readings other than could be the machine. He will switch insurance plans in January and may need to switch to a different glucometer.  We will readdress this at that point. He will let me know if the 25 mg Myrbetriq works for his incontinence.

## 2015-12-23 DIAGNOSIS — H25811 Combined forms of age-related cataract, right eye: Secondary | ICD-10-CM | POA: Diagnosis not present

## 2015-12-23 DIAGNOSIS — H25011 Cortical age-related cataract, right eye: Secondary | ICD-10-CM | POA: Diagnosis not present

## 2015-12-23 DIAGNOSIS — H2511 Age-related nuclear cataract, right eye: Secondary | ICD-10-CM | POA: Diagnosis not present

## 2016-01-06 ENCOUNTER — Ambulatory Visit (INDEPENDENT_AMBULATORY_CARE_PROVIDER_SITE_OTHER): Payer: Commercial Managed Care - HMO | Admitting: Family Medicine

## 2016-01-06 ENCOUNTER — Encounter: Payer: Self-pay | Admitting: Family Medicine

## 2016-01-06 VITALS — BP 130/70 | HR 64 | Ht 68.0 in | Wt 230.4 lb

## 2016-01-06 DIAGNOSIS — M79641 Pain in right hand: Secondary | ICD-10-CM

## 2016-01-06 DIAGNOSIS — E1159 Type 2 diabetes mellitus with other circulatory complications: Secondary | ICD-10-CM

## 2016-01-06 DIAGNOSIS — E785 Hyperlipidemia, unspecified: Secondary | ICD-10-CM | POA: Diagnosis not present

## 2016-01-06 DIAGNOSIS — E119 Type 2 diabetes mellitus without complications: Secondary | ICD-10-CM

## 2016-01-06 DIAGNOSIS — I6529 Occlusion and stenosis of unspecified carotid artery: Secondary | ICD-10-CM | POA: Diagnosis not present

## 2016-01-06 DIAGNOSIS — E1169 Type 2 diabetes mellitus with other specified complication: Secondary | ICD-10-CM | POA: Diagnosis not present

## 2016-01-06 DIAGNOSIS — I10 Essential (primary) hypertension: Secondary | ICD-10-CM | POA: Diagnosis not present

## 2016-01-06 DIAGNOSIS — I152 Hypertension secondary to endocrine disorders: Secondary | ICD-10-CM

## 2016-01-06 NOTE — Progress Notes (Signed)
Chief Complaint  Patient presents with  . Numbness    right hand coldness and numbness, like his hand fell asleep. He had right eye cataract surgery and started using two different eye drops post surgery and that seems to be when the tingling started. The numbness and tingling is all day and all night.    Right 3rd and 4th fingers have had a "cold pain" since the night after Thanksgiving.  He feels like it starts at the wrist, which then radiates out to those two fingers. Feels cold on the inside, feels normal on the outside.  Every once in a while it seems painful, and he needs to rub it, which makes it feel better.  Sometimes it tingles. Denies any weakness  Denies any neck pain, elbow pain (does rest them on armrests). He exercises regularly at the [name] 3 days/week.  Denies change in routine or new exercises.  Uses a blower to blow the leaves, but carried in both hands (switching back and forth).  Denies other repetitive activity.  Over the weekend he also noticed some numbness on the right side of his lip. Never had any weakness, slurred speech or drooling. He thinks these things may be related to his new eye drops. The lip numbness completely resolved.  Patient has diabetes.  He reports that his sugars haven't increased since starting these drops.  Max glucose 126.  He had cataract surgery 2 weeks ago. He is supposed to use the drops until the upcoming Sunday.  He has h/o carotid stenosis.  Pt reports it has been over 2-3 year since the last study. Review of chart shows possibly 07/2012 at [facility]. (no results of this study could be found).  PMH, [place], SH reviewed   Outpatient Encounter Prescriptions as of 01/06/2016  Medication Sig Note  . ACCU-CHEK SOFTCLIX LANCETS lancets  01/06/2016: Received from: External Pharmacy  . aspirin 81 MG tablet Take 81 mg by mouth daily.   . Blood Glucose Calibration (ACCU-CHEK AVIVA) SOLN Use as directed   . Blood Glucose Monitoring Suppl  (ACCU-CHEK AVIVA) device Test twice a day   . glucose blood test strip Test twice a day. Use for accu-check meter DX E11.9   . ketorolac (ACULAR) 0.5 % ophthalmic solution Place 1 drop into the right eye 4 (four) times daily.  01/06/2016: Received from: External Pharmacy  . Lancet Devices (ACCU-CHEK SOFTCLIX) lancets Test twice a day DX: E11.9   . lisinopril-hydrochlorothiazide (ZESTORETIC) 20-12.5 MG tablet Take 1 tablet by mouth daily.   . metFORMIN (GLUCOPHAGE-XR) 500 MG 24 hr tablet TAKE ONE TABLET BY MOUTH ONCE DAILY WITH BREAKFAST   . mirabegron ER (MYRBETRIQ) 25 MG TB24 tablet Take 1 tablet (25 mg total) by mouth daily.   . Multiple Vitamins-Minerals (MULTIVITAMIN WITH MINERALS) tablet Take 1 tablet by mouth daily.   . prednisoLONE acetate (PRED FORTE) 1 % ophthalmic suspension Place 1 drop into the right eye every 2 (two) hours while awake.  01/06/2016: Received from: External Pharmacy  . simvastatin (ZOCOR) 40 MG tablet Take 1 tablet (40 mg total) by mouth daily.   Marland Kitchen VITAMIN D, CHOLECALCIFEROL, PO Take 500 Units by mouth.    No facility-administered encounter medications on file as of 01/06/2016.    No Known Allergies  ROS: no fever, chills, URI symptoms, chest pain, shortness of breath. No weakness, tremor, headache, dizziness, neck pain.  No GI complaints or other concerns, except as noted in HPI.  PHYSICAL EXAM:  BP 130/70 (  BP Location: Left Arm, Patient Position: Sitting, Cuff Size: Normal)   Pulse 64   Ht '5\' 8"'$  (1.727 m)   Wt 230 lb 6.4 oz (104.5 kg)   BMI 35.03 kg/m   Well developed, pleasant male in no distress. HEENT: conjunctiva and sclera are clear. Neck: no lymphadenopathy or thyromegaly  +bruit noted on the right. No cervical spine tenderness Heart: regular rate and rhythm with mild/soft murmur at RUSB--this may be radiating to the carotid.  Lungs: clear bilaterally Extremities: Hyperpigmented at distal right 4th fingertip (h/o skin graft per pt). Normal sensation  to light touch, temperature. Brisk capillary refill. Negative Tinel. Negative Phalen--this actually made his hands feel better Elbows--nontender Neuro: cranial nerves intact.  Normal strength, sensation of hands bilaterally. Normal gait  Lab Results  Component Value Date   HGBA1C 6.9 12/02/2015   Lab Results  Component Value Date   CHOL 147 07/22/2015   HDL 83 07/22/2015   LDLCALC 49 07/22/2015   TRIG 75 07/22/2015   CHOLHDL 1.8 07/22/2015   ASSESSMENT/PLAN:  Pain of right hand - sounds neuropathic; suspect CTS based on description, but exam doesn't support. May need EMG/NCS if persists/worsens  Stenosis of carotid artery, unspecified laterality - bruit noted on right. Due for recheck--pt states Dr. Redmond School plans to order at his next visit in Jan/Feb  Type 2 diabetes mellitus without complication, without long-term current use of insulin ([place]) - well controlled  Hypertension associated with diabetes ([place]) - controlled  Hyperlipidemia associated with type 2 diabetes mellitus ([place]) - lipids at goal, controlled    Neuropathy symptoms in hand. Not really in C7 or C8 distribution and has no neck pain. Sounds more like potential carpal tunnel, but phalen position makes it better, not worse Doesn't sound like uncontrolled DM He does have h/o carotid stenosis and is due for recheck. No other neuro symptoms (short-lived lip numbness, resolved) DM, blood pressure and lipids are controlled, and he is compliant with taking aspirin daily.    I'm not exactly sure of the reason behind the nerve-type pain that you are having in your hand.  Your have no evidence of weakness or progressive symptoms (which is good). Try and avoid excessive repetitive use of the right hand (including vibrations). I think it is fine to continue your eye drops as directed. We will arrange for you to have your carotid studies repeated to ensure no advancement in the blockage of the arteries (which would put you at  increased risk of stroke). Continue all of your current medications--your cholesterol, diabetes and blood pressure are well controlled.  Continue the aspirin daily.  Please return for re-evaluation if progression/worsening of discomfort, tingling, if any weakness develops, or any other neurologic symptoms (severe headache, dizziness, trouble speaking, memory changes, etc).

## 2016-01-06 NOTE — Patient Instructions (Signed)
   I'm not exactly sure of the reason behind the nerve-type pain that you are having in your hand.  Your have no evidence of weakness or progressive symptoms (which is good). Try and avoid excessive repetitive use of the right hand (including vibrations). I think it is fine to continue your eye drops as directed. We will arrange for you to have your carotid studies repeated to ensure no advancement in the blockage of the arteries (which would put you at increased risk of stroke). Continue all of your current medications--your cholesterol, diabetes and blood pressure are well controlled.  Continue the aspirin daily.  Please return for re-evaluation if progression/worsening of discomfort, tingling, if any weakness develops, or any other neurologic symptoms (severe headache, dizziness, trouble speaking, memory changes, etc).

## 2016-01-11 ENCOUNTER — Ambulatory Visit (INDEPENDENT_AMBULATORY_CARE_PROVIDER_SITE_OTHER): Payer: Commercial Managed Care - HMO | Admitting: Family Medicine

## 2016-01-11 ENCOUNTER — Encounter: Payer: Self-pay | Admitting: Family Medicine

## 2016-01-11 VITALS — BP 124/62 | HR 55 | Wt 230.0 lb

## 2016-01-11 DIAGNOSIS — N3941 Urge incontinence: Secondary | ICD-10-CM | POA: Diagnosis not present

## 2016-01-11 DIAGNOSIS — M79641 Pain in right hand: Secondary | ICD-10-CM

## 2016-01-11 DIAGNOSIS — N3281 Overactive bladder: Secondary | ICD-10-CM | POA: Diagnosis not present

## 2016-01-11 DIAGNOSIS — H18413 Arcus senilis, bilateral: Secondary | ICD-10-CM

## 2016-01-11 MED ORDER — MIRABEGRON ER 25 MG PO TB24: 25.0000 mg | ORAL_TABLET | Freq: Every day | ORAL | 3 refills | Status: DC

## 2016-01-11 NOTE — Progress Notes (Signed)
   Subjective:    Patient ID: Shaun Kennedy, male    DOB: 1940-05-09, 75 y.o.   MRN: [id number]  HPI He is here for a recheck. He was seen recently for evaluation of pain/tingling sensation in his right hand. He describes it in the third fourth and fifth fingers. He cannot relate this to any position of his elbow or wrist. He also notes that he has noted slight tingling in the right lip area as well as the tingling into his toes. He's had no weakness, falls, stumbling. No blurred or double vision, headache. He also has underlying OAB and did respond nicely to Myrbetriq. He would like a refill on that. He states that he can get by using this every other day.  Review of Systems     Objective:   Physical Exam Alert and in no distress. EOMI. Arcus senilis noted in the eyes Other cranial nerves grossly intact. No carotid bruit noted. Normal motor, sensory and DTRs of the upper and lower extremities. Negative Tinel and Phalen's test. Negative straight leg raising.       Assessment & Plan:  Pain of right hand  Urge incontinence - Plan: mirabegron ER (MYRBETRIQ) 25 MG TB24 tablet  OAB (overactive bladder) - Plan: mirabegron ER (MYRBETRIQ) 25 MG TB24 tablet I explained that at this point the symptoms were not clear-cut. Discussed various potential issues with that. Recommended he keep track of his symptoms and we can readdress this at a later date especially if symptoms change. He will keep me informed. Myrbetriq was renewed. Over 25 minutes, greater than 50% spent in counseling and coordination of care.

## 2016-01-15 ENCOUNTER — Other Ambulatory Visit: Payer: Self-pay | Admitting: Family Medicine

## 2016-02-04 ENCOUNTER — Encounter: Payer: Self-pay | Admitting: Family Medicine

## 2016-02-04 ENCOUNTER — Ambulatory Visit (INDEPENDENT_AMBULATORY_CARE_PROVIDER_SITE_OTHER): Payer: Commercial Managed Care - HMO | Admitting: Family Medicine

## 2016-02-04 VITALS — BP 130/70 | HR 60 | Wt 232.0 lb

## 2016-02-04 DIAGNOSIS — M79641 Pain in right hand: Secondary | ICD-10-CM

## 2016-02-04 NOTE — Progress Notes (Signed)
   Subjective:    Patient ID: Shaun Kennedy, male    DOB: 1940-10-06, 75 y.o.   MRN: [id number]  HPI He complains of continued difficulty with numbness and tingling sensation in the right hand in the third fourth and fifth fingers. He states that when he grabs something the symptoms worsen and he also gets some weakness. He cannot relate this to either wrist or elbow position.   Review of Systems     Objective:   Physical Exam All motion of the wrist. Negative Tinel and Phalen's test. Normal sensation. Normal strength.       Assessment & Plan:  Pain of right hand - Plan: Motor nerve conduction test w/ f-wave study Symptoms are most consistent with an ulnar neuropathy however he cannot relate this specifically to elbow problems. I will sent for nerve conduction study.

## 2016-02-08 HISTORY — PX: CATARACT EXTRACTION: SUR2

## 2016-02-09 ENCOUNTER — Telehealth: Payer: Self-pay | Admitting: Family Medicine

## 2016-02-09 NOTE — Telephone Encounter (Signed)
Patient wants to check on referral to hand specialist

## 2016-02-09 NOTE — Telephone Encounter (Signed)
Pt informed his referral has been sent to [facility] for his nerve conduction study they are aware of referral and will call him to make appointment pt verbalized understanding

## 2016-03-31 ENCOUNTER — Encounter (INDEPENDENT_AMBULATORY_CARE_PROVIDER_SITE_OTHER): Payer: Self-pay

## 2016-03-31 ENCOUNTER — Ambulatory Visit (INDEPENDENT_AMBULATORY_CARE_PROVIDER_SITE_OTHER): Payer: Medicare HMO | Admitting: Diagnostic Neuroimaging

## 2016-03-31 DIAGNOSIS — M79641 Pain in right hand: Secondary | ICD-10-CM | POA: Diagnosis not present

## 2016-03-31 DIAGNOSIS — Z0289 Encounter for other administrative examinations: Secondary | ICD-10-CM

## 2016-04-01 NOTE — Procedures (Signed)
[facility]  NCS (NERVE CONDUCTION STUDY) WITH EMG (ELECTROMYOGRAPHY) REPORT   STUDY DATE: 03/31/16 PATIENT NAME: Shaun Kennedy DOB: 29-Dec-1940 MRN: [id number]  ORDERING CLINICIAN: Jill Alexanders, MD  TECHNOLOGIST: Oneita Jolly ELECTROMYOGRAPHER: Earlean Polka. Penumalli, MD  CLINICAL INFORMATION: 76 year old male with right hand numbness and pain.   FINDINGS: NERVE CONDUCTION STUDY: Right median motor response has prolonged distal latency, normal amplitude, normal conduction velocity.  Left median motor response has prolonged distal latency, normal amplitude, normal conduction velocity.  Right ulnar motor response and F-wave latency are normal.  Bilateral median sensory responses have prolonged peak latencies and decreased amplitudes.  Right ulnar sensory response is normal.  Bilateral median to ulnar transcarpal comparison studies demonstrate prolonged peak latency difference of the bilateral median responses.   NEEDLE ELECTROMYOGRAPHY: Needle examination of right upper extremity deltoid, biceps, triceps, flexor carpi radialis, first dorsal interosseous and right C6-7 paraspinal muscles is normal.   IMPRESSION:  Abnormal study demonstrating: - Bilateral median neuropathies at the wrist consistent with mild bilateral carpal tunnel syndrome. - No electro-diagnostic evidence of right ulnar neuropathy.     INTERPRETING PHYSICIAN:  Penni Bombard, MD Certified in Neurology, Neurophysiology and Neuroimaging  [facility] [address] [employer] [place], [place] [address] [phone]    [facility]    Nerve / Sites Rec. Site Peak Lat Ref. Amp.1-2 Ref. Distance    ms ms V V cm  R Median, Ulnar - Transcarpal comparison     Median Palm Wrist 2.86 ?2.20 11.8 ?40.0 8     Ulnar Palm Wrist 2.03 ?2.20 8.3 ?12.0 8          L Median, Ulnar - Transcarpal comparison     Median Palm Wrist 2.86 ?2.20 4.1 ?40.0 8     Ulnar Palm Wrist 2.08  ?2.20 6.8 ?12.0 8          R Median - Orthodromic (Dig II, Mid palm)     Dig II Wrist 3.85 ?3.40 6.0 ?10.0 13  L Median - Orthodromic (Dig II, Mid palm)     Dig II Wrist 3.96 ?3.40 3.3 ?10.0 13  R Ulnar - Orthodromic, (Dig V, Mid palm)     Dig V Wrist 2.71 ?3.10 4.8 ?5.0 11     MNC    Nerve / Sites Muscle Latency Ref. Amplitude Ref. Rel Amp Segments Distance Lat Diff Velocity Ref. Area    ms ms mV mV %  cm ms m/s m/s mVms  R Median - APB     Wrist APB 4.5 ?4.4 4.0 ?4.0 100 Wrist - APB 7    14.3     Upper arm APB 9.1  3.7  93.1 Upper arm - Wrist 24 4.6 52  13.7  L Median - APB     Wrist APB 5.1 ?4.4 6.0 ?4.0 100 Wrist - APB 7    22.0     Upper arm APB 10.1  5.6  92.7 Upper arm - Wrist 24 5.1 48  20.1  R Ulnar - ADM     Wrist ADM 2.9 ?3.3 8.2 ?6.0 100 Wrist - ADM 7    29.1     B.Elbow ADM 7.0  7.4  90.7 B.Elbow - Wrist 22 4.2 53 ?49 28.4     A.Elbow ADM 9.0  7.3  97.9 A.Elbow - B.Elbow 10 1.9 52 ?49 27.2         A.Elbow - Wrist  6.1  F  Wave    Nerve F Lat Ref.   ms ms  R Ulnar - ADM 30.5 ?32.0     EMG full       EMG Summary Table    Spontaneous MUAP Recruitment  Muscle IA Fib PSW Fasc Other Amp Dur. Poly Pattern  R. Deltoid Normal None None None _______ Normal Normal Normal Normal  R. Biceps brachii Normal None None None _______ Normal Normal Normal Normal  R. Triceps brachii Normal None None None _______ Normal Normal Normal Normal  R. Flexor carpi radialis Increased None None None _______ Normal Normal Normal Normal  R. First dorsal interosseous Normal None None None _______ Normal Normal Normal Normal  R. Cervical paraspinals Normal None None None _______ Normal Normal Normal Normal

## 2016-04-02 ENCOUNTER — Emergency Department (HOSPITAL_COMMUNITY): Payer: MEDICARE

## 2016-04-02 ENCOUNTER — Encounter (HOSPITAL_COMMUNITY)
Admission: EM | Disposition: A | Discharge status: Home / Self Care | Payer: Self-pay | Source: Home / Self Care | Attending: Orthopaedic Surgery

## 2016-04-02 ENCOUNTER — Emergency Department (HOSPITAL_COMMUNITY): Payer: MEDICARE | Admitting: Anesthesiology

## 2016-04-02 ENCOUNTER — Encounter (HOSPITAL_COMMUNITY): Payer: Self-pay | Admitting: Emergency Medicine

## 2016-04-02 ENCOUNTER — Ambulatory Visit (HOSPITAL_COMMUNITY)
Admission: EM | Admit: 2016-04-02 | Discharge: 2016-04-02 | Disposition: A | Discharge status: Home / Self Care | Payer: MEDICARE | Source: Home / Self Care

## 2016-04-02 ENCOUNTER — Inpatient Hospital Stay (HOSPITAL_COMMUNITY)
Admission: EM | Admit: 2016-04-02 | Discharge: 2016-04-03 | DRG: 909 | Disposition: A | Discharge status: Home / Self Care | Payer: MEDICARE | Attending: Orthopaedic Surgery | Admitting: Orthopaedic Surgery

## 2016-04-02 DIAGNOSIS — I152 Hypertension secondary to endocrine disorders: Secondary | ICD-10-CM | POA: Diagnosis not present

## 2016-04-02 DIAGNOSIS — Z7984 Long term (current) use of oral hypoglycemic drugs: Secondary | ICD-10-CM

## 2016-04-02 DIAGNOSIS — W28XXXA Contact with powered lawn mower, initial encounter: Secondary | ICD-10-CM | POA: Diagnosis not present

## 2016-04-02 DIAGNOSIS — E784 Other hyperlipidemia: Secondary | ICD-10-CM | POA: Diagnosis present

## 2016-04-02 DIAGNOSIS — S91111A Laceration without foreign body of right great toe without damage to nail, initial encounter: Secondary | ICD-10-CM

## 2016-04-02 DIAGNOSIS — S91101A Unspecified open wound of right great toe without damage to nail, initial encounter: Secondary | ICD-10-CM | POA: Diagnosis not present

## 2016-04-02 DIAGNOSIS — Z87891 Personal history of nicotine dependence: Secondary | ICD-10-CM | POA: Diagnosis not present

## 2016-04-02 DIAGNOSIS — Y93H2 Activity, gardening and landscaping: Secondary | ICD-10-CM

## 2016-04-02 DIAGNOSIS — E1136 Type 2 diabetes mellitus with diabetic cataract: Secondary | ICD-10-CM | POA: Diagnosis present

## 2016-04-02 DIAGNOSIS — Y92017 Garden or yard in single-family (private) house as the place of occurrence of the external cause: Secondary | ICD-10-CM

## 2016-04-02 DIAGNOSIS — S92491A Other fracture of right great toe, initial encounter for closed fracture: Secondary | ICD-10-CM | POA: Diagnosis not present

## 2016-04-02 DIAGNOSIS — E1151 Type 2 diabetes mellitus with diabetic peripheral angiopathy without gangrene: Secondary | ICD-10-CM | POA: Diagnosis present

## 2016-04-02 DIAGNOSIS — E1159 Type 2 diabetes mellitus with other circulatory complications: Secondary | ICD-10-CM | POA: Diagnosis not present

## 2016-04-02 DIAGNOSIS — Z8249 Family history of ischemic heart disease and other diseases of the circulatory system: Secondary | ICD-10-CM | POA: Diagnosis not present

## 2016-04-02 DIAGNOSIS — S98121A Partial traumatic amputation of right great toe, initial encounter: Principal | ICD-10-CM | POA: Diagnosis present

## 2016-04-02 DIAGNOSIS — Z0389 Encounter for observation for other suspected diseases and conditions ruled out: Secondary | ICD-10-CM | POA: Diagnosis not present

## 2016-04-02 HISTORY — PX: I & D EXTREMITY: SHX5045

## 2016-04-02 LAB — CBC WITH DIFFERENTIAL/PLATELET
BASOS PCT: 0 %
Basophils Absolute: 0 10*3/uL (ref 0.0–0.1)
Eosinophils Absolute: 0.6 10*3/uL (ref 0.0–0.7)
Eosinophils Relative: 9 %
HCT: 35.7 % — ABNORMAL LOW (ref 39.0–52.0)
HEMOGLOBIN: 11.9 g/dL — AB (ref 13.0–17.0)
LYMPHS ABS: 2.3 10*3/uL (ref 0.7–4.0)
Lymphocytes Relative: 31 %
MCH: 30.8 pg (ref 26.0–34.0)
MCHC: 33.3 g/dL (ref 30.0–36.0)
MCV: 92.5 fL (ref 78.0–100.0)
MONO ABS: 0.6 10*3/uL (ref 0.1–1.0)
MONOS PCT: 8 %
NEUTROS PCT: 52 %
Neutro Abs: 3.7 10*3/uL (ref 1.7–7.7)
Platelets: 242 10*3/uL (ref 150–400)
RBC: 3.86 MIL/uL — ABNORMAL LOW (ref 4.22–5.81)
RDW: 12.2 % (ref 11.5–15.5)
WBC: 7.3 10*3/uL (ref 4.0–10.5)

## 2016-04-02 LAB — I-STAT CHEM 8, ED
BUN: 17 mg/dL (ref 6–20)
CALCIUM ION: 1.2 mmol/L (ref 1.15–1.40)
CREATININE: 1.2 mg/dL (ref 0.61–1.24)
Chloride: 102 mmol/L (ref 101–111)
Glucose, Bld: 104 mg/dL — ABNORMAL HIGH (ref 65–99)
HEMATOCRIT: 36 % — AB (ref 39.0–52.0)
HEMOGLOBIN: 12.2 g/dL — AB (ref 13.0–17.0)
Potassium: 3.7 mmol/L (ref 3.5–5.1)
SODIUM: 142 mmol/L (ref 135–145)
TCO2: 29 mmol/L (ref 0–100)

## 2016-04-02 LAB — GLUCOSE, CAPILLARY: GLUCOSE-CAPILLARY: 98 mg/dL (ref 65–99)

## 2016-04-02 LAB — CBG MONITORING, ED: Glucose-Capillary: 99 mg/dL (ref 65–99)

## 2016-04-02 SURGERY — IRRIGATION AND DEBRIDEMENT EXTREMITY
Anesthesia: General | Laterality: Right

## 2016-04-02 MED ORDER — LACTATED RINGERS IV SOLN
INTRAVENOUS | Status: DC
  Administered 2016-04-02: 18:00:00 via INTRAVENOUS

## 2016-04-02 MED ORDER — METHOCARBAMOL 500 MG PO TABS: 500.0000 mg | ORAL_TABLET | Freq: Four times a day (QID) | ORAL | Status: DC | PRN

## 2016-04-02 MED ORDER — ONDANSETRON HCL 4 MG/2ML IJ SOLN: 4.0000 mg | Freq: Four times a day (QID) | INTRAMUSCULAR | Status: DC | PRN

## 2016-04-02 MED ORDER — ONDANSETRON HCL 4 MG/2ML IJ SOLN
INTRAMUSCULAR | Status: AC
  Filled 2016-04-02: qty 2

## 2016-04-02 MED ORDER — SIMVASTATIN 40 MG PO TABS
40.0000 mg | ORAL_TABLET | Freq: Every day | ORAL | Status: DC
Start: 2016-04-03 — End: 2016-04-03
  Administered 2016-04-03: 40 mg via ORAL
  Filled 2016-04-02: qty 1

## 2016-04-02 MED ORDER — GENTAMICIN SULFATE 40 MG/ML IJ SOLN
400.0000 mg | Freq: Once | INTRAMUSCULAR | Status: AC
  Administered 2016-04-02: 400 mg via INTRAVENOUS
  Filled 2016-04-02: qty 10

## 2016-04-02 MED ORDER — INSULIN ASPART 100 UNIT/ML ~~LOC~~ SOLN: 0.0000 [IU] | Freq: Three times a day (TID) | SUBCUTANEOUS | Status: DC

## 2016-04-02 MED ORDER — SUCCINYLCHOLINE CHLORIDE 20 MG/ML IJ SOLN
INTRAMUSCULAR | Status: DC | PRN
  Administered 2016-04-02: 80 mg via INTRAVENOUS

## 2016-04-02 MED ORDER — PREDNISOLONE ACETATE 1 % OP SUSP
1.0000 [drp] | OPHTHALMIC | Status: DC
  Administered 2016-04-03: 1 [drp] via OPHTHALMIC
  Filled 2016-04-02: qty 1

## 2016-04-02 MED ORDER — SORBITOL 70 % SOLN: 30.0000 mL | Freq: Every day | Status: DC | PRN

## 2016-04-02 MED ORDER — METOCLOPRAMIDE HCL 5 MG PO TABS: 5.0000 mg | ORAL_TABLET | Freq: Three times a day (TID) | ORAL | Status: DC | PRN

## 2016-04-02 MED ORDER — OXYCODONE-ACETAMINOPHEN 5-325 MG PO TABS: 1.0000 | ORAL_TABLET | ORAL | 0 refills | Status: DC | PRN

## 2016-04-02 MED ORDER — ACETAMINOPHEN 650 MG RE SUPP: 650.0000 mg | Freq: Four times a day (QID) | RECTAL | Status: DC | PRN

## 2016-04-02 MED ORDER — LISINOPRIL 20 MG PO TABS
20.0000 mg | ORAL_TABLET | Freq: Every day | ORAL | Status: DC
  Administered 2016-04-03: 20 mg via ORAL
  Filled 2016-04-02: qty 1

## 2016-04-02 MED ORDER — FENTANYL CITRATE (PF) 100 MCG/2ML IJ SOLN
INTRAMUSCULAR | Status: AC
  Filled 2016-04-02: qty 4

## 2016-04-02 MED ORDER — BACITRACIN ZINC 500 UNIT/GM EX OINT
TOPICAL_OINTMENT | CUTANEOUS | Status: AC
  Filled 2016-04-02: qty 28.35

## 2016-04-02 MED ORDER — PENICILLIN G POTASSIUM 5000000 UNITS IJ SOLR
2.0000 10*6.[IU] | Freq: Once | INTRAVENOUS | Status: AC
  Administered 2016-04-02: 2 10*6.[IU] via INTRAVENOUS
  Filled 2016-04-02: qty 2

## 2016-04-02 MED ORDER — PROPOFOL 10 MG/ML IV BOLUS
INTRAVENOUS | Status: DC | PRN
  Administered 2016-04-02: 140 mg via INTRAVENOUS

## 2016-04-02 MED ORDER — GLYCOPYRROLATE 0.2 MG/ML IJ SOLN
INTRAMUSCULAR | Status: DC | PRN
  Administered 2016-04-02: 0.4 mg via INTRAVENOUS

## 2016-04-02 MED ORDER — ASPIRIN 81 MG PO CHEW
81.0000 mg | CHEWABLE_TABLET | Freq: Every day | ORAL | Status: DC
  Administered 2016-04-03: 81 mg via ORAL
  Filled 2016-04-02: qty 1

## 2016-04-02 MED ORDER — MAGNESIUM CITRATE PO SOLN: 1.0000 | Freq: Once | ORAL | Status: DC | PRN

## 2016-04-02 MED ORDER — BUPIVACAINE HCL 0.5 % IJ SOLN
50.0000 mL | Freq: Once | INTRAMUSCULAR | Status: AC
  Administered 2016-04-02: 50 mL
  Filled 2016-04-02: qty 50

## 2016-04-02 MED ORDER — OXYCODONE HCL 5 MG PO TABS: 5.0000 mg | ORAL_TABLET | Freq: Once | ORAL | Status: DC | PRN

## 2016-04-02 MED ORDER — ONDANSETRON HCL 4 MG PO TABS: 4.0000 mg | ORAL_TABLET | Freq: Four times a day (QID) | ORAL | Status: DC | PRN

## 2016-04-02 MED ORDER — INSULIN ASPART 100 UNIT/ML ~~LOC~~ SOLN: 0.0000 [IU] | Freq: Every day | SUBCUTANEOUS | Status: DC

## 2016-04-02 MED ORDER — POLYETHYLENE GLYCOL 3350 17 G PO PACK: 17.0000 g | PACK | Freq: Every day | ORAL | Status: DC | PRN

## 2016-04-02 MED ORDER — CEFAZOLIN IN D5W 1 GM/50ML IV SOLN: 1.0000 g | Freq: Three times a day (TID) | INTRAVENOUS | Status: DC

## 2016-04-02 MED ORDER — CEFAZOLIN SODIUM 1 G IJ SOLR
INTRAMUSCULAR | Status: AC
  Filled 2016-04-02: qty 10

## 2016-04-02 MED ORDER — PROPOFOL 10 MG/ML IV BOLUS
INTRAVENOUS | Status: AC
  Filled 2016-04-02: qty 20

## 2016-04-02 MED ORDER — OXYCODONE HCL 5 MG/5ML PO SOLN: 5.0000 mg | Freq: Once | ORAL | Status: DC | PRN

## 2016-04-02 MED ORDER — BACITRACIN ZINC 500 UNIT/GM EX OINT
TOPICAL_OINTMENT | CUTANEOUS | Status: DC | PRN
  Administered 2016-04-02: 1 via TOPICAL

## 2016-04-02 MED ORDER — LACTATED RINGERS IV SOLN
INTRAVENOUS | Status: DC | PRN
  Administered 2016-04-02: 18:00:00 via INTRAVENOUS

## 2016-04-02 MED ORDER — SUCCINYLCHOLINE CHLORIDE 200 MG/10ML IV SOSY
PREFILLED_SYRINGE | INTRAVENOUS | Status: AC
  Filled 2016-04-02: qty 20

## 2016-04-02 MED ORDER — SODIUM CHLORIDE 0.9 % IV SOLN
INTRAVENOUS | Status: DC
  Administered 2016-04-03: 04:00:00 via INTRAVENOUS

## 2016-04-02 MED ORDER — ONDANSETRON HCL 4 MG/2ML IJ SOLN
INTRAMUSCULAR | Status: DC | PRN
  Administered 2016-04-02: 4 mg via INTRAVENOUS

## 2016-04-02 MED ORDER — METHOCARBAMOL 1000 MG/10ML IJ SOLN: 500.0000 mg | Freq: Four times a day (QID) | INTRAVENOUS | Status: DC | PRN

## 2016-04-02 MED ORDER — PROMETHAZINE HCL 25 MG PO TABS: 25.0000 mg | ORAL_TABLET | Freq: Four times a day (QID) | ORAL | 1 refills | Status: DC | PRN

## 2016-04-02 MED ORDER — MIDAZOLAM HCL 2 MG/2ML IJ SOLN
INTRAMUSCULAR | Status: DC | PRN
  Administered 2016-04-02: 1 mg via INTRAVENOUS

## 2016-04-02 MED ORDER — HYDROCHLOROTHIAZIDE 12.5 MG PO CAPS
12.5000 mg | ORAL_CAPSULE | Freq: Every day | ORAL | Status: DC
  Administered 2016-04-03: 12.5 mg via ORAL
  Filled 2016-04-02: qty 1

## 2016-04-02 MED ORDER — CEFAZOLIN IN D5W 1 GM/50ML IV SOLN
INTRAVENOUS | Status: DC | PRN
  Administered 2016-04-02: 1 g via INTRAVENOUS

## 2016-04-02 MED ORDER — DIPHENHYDRAMINE HCL 12.5 MG/5ML PO ELIX: 25.0000 mg | ORAL_SOLUTION | ORAL | Status: DC | PRN

## 2016-04-02 MED ORDER — MORPHINE SULFATE (PF) 2 MG/ML IV SOLN: 1.0000 mg | INTRAVENOUS | Status: DC | PRN

## 2016-04-02 MED ORDER — OXYCODONE HCL 5 MG PO TABS
5.0000 mg | ORAL_TABLET | ORAL | Status: DC | PRN
  Administered 2016-04-03: 15 mg via ORAL
  Filled 2016-04-02: qty 3

## 2016-04-02 MED ORDER — KETOROLAC TROMETHAMINE 0.5 % OP SOLN
1.0000 [drp] | Freq: Four times a day (QID) | OPHTHALMIC | Status: DC
  Administered 2016-04-03: 1 [drp] via OPHTHALMIC
  Filled 2016-04-02: qty 3

## 2016-04-02 MED ORDER — LISINOPRIL-HYDROCHLOROTHIAZIDE 20-12.5 MG PO TABS: 1.0000 | ORAL_TABLET | Freq: Every day | ORAL | Status: DC

## 2016-04-02 MED ORDER — ADULT MULTIVITAMIN W/MINERALS CH
1.0000 | ORAL_TABLET | Freq: Every day | ORAL | Status: DC
  Administered 2016-04-03: 1 via ORAL
  Filled 2016-04-02: qty 1

## 2016-04-02 MED ORDER — CEFAZOLIN SODIUM-DEXTROSE 2-4 GM/100ML-% IV SOLN
2.0000 g | Freq: Four times a day (QID) | INTRAVENOUS | Status: DC
  Administered 2016-04-03 (×2): 2 g via INTRAVENOUS
  Filled 2016-04-02 (×3): qty 100

## 2016-04-02 MED ORDER — ACETAMINOPHEN 325 MG PO TABS: 650.0000 mg | ORAL_TABLET | Freq: Four times a day (QID) | ORAL | Status: DC | PRN

## 2016-04-02 MED ORDER — FENTANYL CITRATE (PF) 100 MCG/2ML IJ SOLN
INTRAMUSCULAR | Status: DC | PRN
  Administered 2016-04-02: 50 ug via INTRAVENOUS

## 2016-04-02 MED ORDER — CIPROFLOXACIN HCL 500 MG PO TABS: 500.0000 mg | ORAL_TABLET | Freq: Two times a day (BID) | ORAL | 0 refills | Status: DC

## 2016-04-02 MED ORDER — METOCLOPRAMIDE HCL 5 MG/ML IJ SOLN: 5.0000 mg | Freq: Three times a day (TID) | INTRAMUSCULAR | Status: DC | PRN

## 2016-04-02 MED ORDER — TETANUS-DIPHTH-ACELL PERTUSSIS 5-2.5-18.5 LF-MCG/0.5 IM SUSP: 0.5000 mL | Freq: Once | INTRAMUSCULAR | Status: DC

## 2016-04-02 MED ORDER — FENTANYL CITRATE (PF) 100 MCG/2ML IJ SOLN: 25.0000 ug | INTRAMUSCULAR | Status: DC | PRN

## 2016-04-02 MED ORDER — MIDAZOLAM HCL 2 MG/2ML IJ SOLN
INTRAMUSCULAR | Status: AC
  Filled 2016-04-02: qty 2

## 2016-04-02 MED ORDER — MIRABEGRON ER 25 MG PO TB24
25.0000 mg | ORAL_TABLET | Freq: Every day | ORAL | Status: DC
Start: 2016-04-03 — End: 2016-04-03
  Administered 2016-04-03: 25 mg via ORAL
  Filled 2016-04-02: qty 1

## 2016-04-02 MED ORDER — CEFAZOLIN SODIUM-DEXTROSE 2-4 GM/100ML-% IV SOLN
2.0000 g | Freq: Three times a day (TID) | INTRAVENOUS | Status: DC
  Administered 2016-04-02 (×2): 2 g via INTRAVENOUS
  Filled 2016-04-02 (×3): qty 100

## 2016-04-02 MED ORDER — SENNOSIDES-DOCUSATE SODIUM 8.6-50 MG PO TABS: 1.0000 | ORAL_TABLET | Freq: Every evening | ORAL | 1 refills | Status: DC | PRN

## 2016-04-02 SURGICAL SUPPLY — 49 items
BANDAGE COBAN STERILE 2 (GAUZE/BANDAGES/DRESSINGS) ×1 IMPLANT
BANDAGE ELASTIC 3 VELCRO ST LF (GAUZE/BANDAGES/DRESSINGS) IMPLANT
BNDG COHESIVE 1X5 TAN STRL LF (GAUZE/BANDAGES/DRESSINGS) IMPLANT
BNDG COHESIVE 4X5 TAN STRL (GAUZE/BANDAGES/DRESSINGS) ×1 IMPLANT
BNDG COHESIVE 6X5 TAN STRL LF (GAUZE/BANDAGES/DRESSINGS) ×2 IMPLANT
BNDG CONFORM 3 STRL LF (GAUZE/BANDAGES/DRESSINGS) ×1 IMPLANT
BNDG GAUZE STRTCH 6 (GAUZE/BANDAGES/DRESSINGS) ×2 IMPLANT
CORDS BIPOLAR (ELECTRODE) IMPLANT
COVER SURGICAL LIGHT HANDLE (MISCELLANEOUS) ×2 IMPLANT
CUFF TOURNIQUET SINGLE 24IN (TOURNIQUET CUFF) IMPLANT
CUFF TOURNIQUET SINGLE 34IN LL (TOURNIQUET CUFF) IMPLANT
CUFF TOURNIQUET SINGLE 44IN (TOURNIQUET CUFF) IMPLANT
DRAPE INCISE IOBAN 66X45 STRL (DRAPES) IMPLANT
DRAPE U-SHAPE 47X51 STRL (DRAPES) ×2 IMPLANT
DRSG EMULSION OIL 3X3 NADH (GAUZE/BANDAGES/DRESSINGS) ×1 IMPLANT
DURAPREP 26ML APPLICATOR (WOUND CARE) ×2 IMPLANT
ELECT REM PT RETURN 9FT ADLT (ELECTROSURGICAL)
ELECTRODE REM PT RTRN 9FT ADLT (ELECTROSURGICAL) IMPLANT
GAUZE SPONGE 4X4 12PLY STRL (GAUZE/BANDAGES/DRESSINGS) ×4 IMPLANT
GAUZE XEROFORM 5X9 LF (GAUZE/BANDAGES/DRESSINGS) ×2 IMPLANT
GLOVE SKINSENSE NS SZ7.5 (GLOVE) ×2
GLOVE SKINSENSE STRL SZ7.5 (GLOVE) ×2 IMPLANT
GOWN STRL REIN XL XLG (GOWN DISPOSABLE) ×4 IMPLANT
HANDPIECE INTERPULSE COAX TIP (DISPOSABLE)
KIT BASIN OR (CUSTOM PROCEDURE TRAY) ×2 IMPLANT
KIT ROOM TURNOVER OR (KITS) ×2 IMPLANT
MANIFOLD NEPTUNE II (INSTRUMENTS) ×2 IMPLANT
PACK ORTHO EXTREMITY (CUSTOM PROCEDURE TRAY) ×2 IMPLANT
PAD ABD 8X10 STRL (GAUZE/BANDAGES/DRESSINGS) ×2 IMPLANT
PAD ARMBOARD 7.5X6 YLW CONV (MISCELLANEOUS) ×4 IMPLANT
PADDING CAST ABS 4INX4YD NS (CAST SUPPLIES) ×1
PADDING CAST ABS COTTON 4X4 ST (CAST SUPPLIES) ×1 IMPLANT
PADDING CAST COTTON 6X4 STRL (CAST SUPPLIES) ×2 IMPLANT
SET HNDPC FAN SPRY TIP SCT (DISPOSABLE) IMPLANT
SPONGE GAUZE 4X4 12PLY STER LF (GAUZE/BANDAGES/DRESSINGS) ×1 IMPLANT
SPONGE LAP 18X18 X RAY DECT (DISPOSABLE) ×2 IMPLANT
STOCKINETTE IMPERVIOUS 9X36 MD (GAUZE/BANDAGES/DRESSINGS) ×2 IMPLANT
SUT ETHILON 2 0 FS 18 (SUTURE) ×6 IMPLANT
SUT ETHILON 2 0 PSLX (SUTURE) ×2 IMPLANT
SUT VIC AB 2-0 FS1 27 (SUTURE) ×4 IMPLANT
TOWEL OR 17X24 6PK STRL BLUE (TOWEL DISPOSABLE) ×2 IMPLANT
TOWEL OR 17X26 10 PK STRL BLUE (TOWEL DISPOSABLE) ×2 IMPLANT
TUBE ANAEROBIC SPECIMEN COL (MISCELLANEOUS) IMPLANT
TUBE CONNECTING 12X1/4 (SUCTIONS) ×2 IMPLANT
TUBE FEEDING 5FR 15 INCH (TUBING) IMPLANT
TUBING CYSTO DISP (UROLOGICAL SUPPLIES) ×2 IMPLANT
UNDERPAD 30X30 (UNDERPADS AND DIAPERS) ×4 IMPLANT
WATER STERILE IRR 1000ML POUR (IV SOLUTION) ×2 IMPLANT
YANKAUER SUCT BULB TIP NO VENT (SUCTIONS) ×2 IMPLANT

## 2016-04-02 NOTE — ED Triage Notes (Signed)
Pt reports cut his right great toe and foot while using a lawn mower about 1-2 hours ago. Pt presents with guaze bandage to right foot and toe.

## 2016-04-02 NOTE — ED Provider Notes (Signed)
[facility] DEPT Provider Note   CSN: [id number] Arrival date & time: 04/02/16  1253     History   Chief Complaint Chief Complaint  Patient presents with  . Extremity Laceration    HPI Shaun Kennedy is a 76 y.o. male.  HPI Shaun Kennedy is a 76 y.o. male presents to ED with complaint of a toe injury. Pt states injury occurred just prior to coming to ED. States was using a lawnmower when his foot slipped and went under a Social worker. Reports putting peroxide and washing it prior to coming in. States pain is sharp. 2/10 at this time. Reports bleeding that is now controlled with pressure. Tdap up to date. Pain with walking and palpation.   Past Medical History:  Diagnosis Date  . Allergic rhinitis   . Asthma   . Carotid stenosis   . Diabetes mellitus ([place])   . Diverticulosis   . Dyslipidemia   . ED (erectile dysfunction)   . Hemorrhoids   . HTN (hypertension)   . LVH (left ventricular hypertrophy)    on EKG  . Prostate cancer ([place])   . Smoker    former    Patient Active Problem List   Diagnosis Date Noted  . Cataract associated with type 2 diabetes mellitus ([place]) 12/02/2015  . Onychomycosis of multiple toenails with type 2 diabetes mellitus ([place]) 07/22/2015  . Former smoker, stopped smoking in distant past 07/22/2015  . Diabetes mellitus ([place]) 06/13/2011  . ED (erectile dysfunction) 06/13/2011  . History of prostate cancer 06/13/2011  . Hypertension associated with diabetes ([place]) 06/13/2011  . Hyperlipidemia associated with type 2 diabetes mellitus ([place]) 06/13/2011  . Allergic rhinitis, mild 06/13/2011  . History of asthma 06/13/2011  . Carotid stenosis 06/13/2011    Past Surgical History:  Procedure Laterality Date  . COLONOSCOPY  2007   Dr. Benson Norway       Home Medications    Prior to Admission medications   Medication Sig Start Date End Date Taking? Authorizing Provider  aspirin 81 MG tablet Take 81 mg by mouth daily.    Historical Provider, MD    Blood Glucose Calibration (ACCU-CHEK AVIVA) SOLN Use as directed Patient not taking: Reported on 04/02/2016 02/16/15   [name], MD  glucose blood test strip Test twice a day. Use for accu-check meter DX E11.9 Patient not taking: Reported on 04/02/2016 08/06/15   [name], MD  ketorolac (ACULAR) 0.5 % ophthalmic solution Place 1 drop into the right eye 4 (four) times daily.  12/24/15   Historical Provider, MD  Lancet Devices [facility]) lancets Test twice a day DX: E11.9 Patient not taking: Reported on 04/02/2016 08/06/15   [name], MD  lisinopril-hydrochlorothiazide (ZESTORETIC) 20-12.5 MG tablet Take 1 tablet by mouth daily. 03/02/15   [name], MD  metFORMIN (GLUCOPHAGE-XR) 500 MG 24 hr tablet TAKE 1 TABLET ONE TIME DAILY WITH BREAKFAST 01/18/16   [name], MD  mirabegron ER (MYRBETRIQ) 25 MG TB24 tablet Take 1 tablet (25 mg total) by mouth daily. 01/11/16   [name], MD  Multiple Vitamins-Minerals (MULTIVITAMIN WITH MINERALS) tablet Take 1 tablet by mouth daily.    Historical Provider, MD  prednisoLONE acetate (PRED FORTE) 1 % ophthalmic suspension Place 1 drop into the right eye every 2 (two) hours while awake.  12/14/15   Historical Provider, MD  simvastatin (ZOCOR) 40 MG tablet Take 1 tablet (40 mg total) by mouth daily. 03/02/15   Elyse Jarvis  Redmond School, MD  VITAMIN D, CHOLECALCIFEROL, PO Take 500 Units by mouth.    Historical Provider, MD    Family History Family History  Problem Relation Age of Onset  . Heart failure Mother   . Stroke Father     Social History Social History  Substance Use Topics  . Smoking status: Former Smoker    Quit date: 10/09/1987  . Smokeless tobacco: Never Used  . Alcohol use No     Allergies   Patient has no known allergies.   Review of Systems Review of Systems  Constitutional: Negative for chills and fever.  Respiratory: Negative for cough, chest tightness and shortness of breath.   Cardiovascular: Negative for  chest pain, palpitations and leg swelling.  Musculoskeletal: Positive for arthralgias. Negative for myalgias, neck pain and neck stiffness.  Skin: Positive for wound. Negative for rash.  Allergic/Immunologic: Negative for immunocompromised state.  Neurological: Negative for dizziness, weakness, light-headedness, numbness and headaches.  All other systems reviewed and are negative.    Physical Exam Updated Vital Signs BP 166/61   Pulse 64   Temp 97.9 F (36.6 C) (Oral)   Resp 18   Ht '5\' 8"'$  (1.727 m)   Wt 101.6 kg   SpO2 99%   BMI 34.06 kg/m   Physical Exam  Constitutional: He appears well-developed and well-nourished. No distress.  Eyes: Conjunctivae are normal.  Neck: Neck supple.  Cardiovascular: Normal rate.   Pulmonary/Chest: No respiratory distress.  Abdominal: He exhibits no distension.  Musculoskeletal:  Laceration to the right great toe, parallel and just deep to the toenail. Hemostatic at this time. Cap refill <2sec distally. Slight decreased sensation to the tip of the toe.   Skin: Skin is warm and dry.  Nursing note and vitals reviewed.        ED Treatments / Results  Labs (all labs ordered are listed, but only abnormal results are displayed) Labs Reviewed - No data to display  EKG  EKG Interpretation None       Radiology Dg Foot Complete Right  Result Date: 04/02/2016 CLINICAL DATA:  Leary Roca mower injury involving the right great toe 2 hours ago. Initial encounter. EXAM: RIGHT FOOT COMPLETE - 3+ VIEW COMPARISON:  None. FINDINGS: A comminuted fracture of the great toe tuft is noted. There is no evidence of subluxation or dislocation. No other fractures are noted. No definite radiopaque foreign bodies identified. IMPRESSION: Comminuted tuft fracture of the great toe. Electronically Signed   By: Margarette Canada M.D.   On: 04/02/2016 14:08    Procedures Procedures (including critical care time)  NERVE BLOCK Performed by: Jeannett Senior A Consent:  Verbal consent obtained. Required items: required blood products, implants, devices, and special equipment available Time out: Immediately prior to procedure a "time out" was called to verify the correct patient, procedure, equipment, support staff and site/side marked as required.  Indication: complex laceration/fracture right great toe Nerve block body site: right great toe  Preparation: Patient was prepped and draped in the usual sterile fashion. Needle gauge: 25 G Location technique: anatomical landmarks  Local anesthetic: bupivacaine  Anesthetic total: 4 ml  Outcome: pain improved Patient tolerance: Patient tolerated the procedure well with no immediate complications.    Medications Ordered in ED Medications - No data to display   Initial Impression / Assessment and Plan / ED Course  I have reviewed the triage vital signs and the nursing notes.  Pertinent labs & imaging results that were available during my care of the patient  were reviewed by me and considered in my medical decision making (see chart for details).     3:45 PM Spoke with Dr. Erlinda Hong, asked to administer ancef, penicillin 12mllion units, gentamycin. Spoke with pharmacy regarding dosage of gent, will put pharm consult. Block performed for pain control. Patient will be admitted by Dr. XErlinda Hong for further treatment.  Vitals:   04/02/16 1945 04/02/16 1953 04/02/16 1957 04/02/16 2044  BP: 140/66 132/62 139/63 (!) 140/56  Pulse: 78 78 93 (!) 59  Resp: 15 20    Temp:   97.4 F (36.3 C) 97.6 F (36.4 C)  TempSrc:    Oral  SpO2:    95%  Weight:      Height:         Final Clinical Impressions(s) / ED Diagnoses   Final diagnoses:  Laceration of great toe of right foot, foreign body presence unspecified, nail damage status unspecified, initial encounter    New Prescriptions New Prescriptions   No medications on file     TJeannett Senior PA-C 04/02/16 2334    TJeannett Senior PA-C 04/02/16  2334    NDavonna Belling MD 04/03/16 2321

## 2016-04-02 NOTE — Addendum Note (Signed)
Addendum  created 04/02/16 1948 by Oleta Mouse, MD   Order list changed, Order sets accessed

## 2016-04-02 NOTE — Anesthesia Postprocedure Evaluation (Addendum)
Anesthesia Post Note  Patient: Shaun Kennedy  Procedure(s) Performed: Procedure(s) (LRB): IRRIGATION AND DEBRIDEMENT GREAT TOE (Right)  Patient location during evaluation: PACU Anesthesia Type: General Level of consciousness: awake and alert Pain management: pain level controlled Vital Signs Assessment: post-procedure vital signs reviewed and stable Respiratory status: spontaneous breathing, nonlabored ventilation, respiratory function stable and patient connected to nasal cannula oxygen Cardiovascular status: blood pressure returned to baseline and stable Postop Assessment: no signs of nausea or vomiting Anesthetic complications: no       Last Vitals:  Vitals:   04/02/16 1645 04/02/16 1925  BP: 162/64 (!) 137/59  Pulse: (!) 59 86  Resp: 23 (!) 22  Temp:  36.4 C    Last Pain:  Vitals:   04/02/16 1508  TempSrc:   PainSc: 2                  Nyjai Graff

## 2016-04-02 NOTE — ED Notes (Signed)
Patient injured right great toe mowing the lawn, toe cut with blade from mower.  Great toe nail dislodged, deep laceration to inside of great toe.  Patient's wounds are bleeding and sandal is covered in blood.  Notified dr Joseph Art evaluated patient's injury.  Patient is a diabetic.

## 2016-04-02 NOTE — Anesthesia Preprocedure Evaluation (Signed)
Anesthesia Evaluation  Patient identified by MRN, date of birth, ID band Patient awake    Reviewed: Allergy & Precautions, NPO status , Patient's Chart, lab work & pertinent test results  History of Anesthesia Complications Negative for: history of anesthetic complications  Airway Mallampati: II  TM Distance: >3 FB Neck ROM: Full    Dental  (+) Upper Dentures, Lower Dentures   Pulmonary asthma , former smoker,    breath sounds clear to auscultation       Cardiovascular hypertension, + Peripheral Vascular Disease   Rhythm:Regular     Neuro/Psych negative neurological ROS  negative psych ROS   GI/Hepatic negative GI ROS, Neg liver ROS,   Endo/Other  diabetes, Type 2, Oral Hypoglycemic Agents  Renal/GU negative Renal ROS     Musculoskeletal negative musculoskeletal ROS (+)   Abdominal   Peds  Hematology negative hematology ROS (+)   Anesthesia Other Findings   Reproductive/Obstetrics                             Anesthesia Physical Anesthesia Plan  ASA: II  Anesthesia Plan: General   Post-op Pain Management:    Induction: Intravenous  Airway Management Planned: Oral ETT  Additional Equipment: None  Intra-op Plan:   Post-operative Plan: Extubation in OR  Informed Consent: I have reviewed the patients History and Physical, chart, labs and discussed the procedure including the risks, benefits and alternatives for the proposed anesthesia with the patient or authorized representative who has indicated his/her understanding and acceptance.   Dental advisory given  Plan Discussed with: CRNA and Surgeon  Anesthesia Plan Comments:         Anesthesia Quick Evaluation

## 2016-04-02 NOTE — ED Notes (Signed)
Surgeon was at bedside

## 2016-04-02 NOTE — Transfer of Care (Signed)
Immediate Anesthesia Transfer of Care Note  Patient: Shaun Kennedy  Procedure(s) Performed: Procedure(s): IRRIGATION AND DEBRIDEMENT GREAT TOE (Right)  Patient Location: PACU  Anesthesia Type:General  Level of Consciousness: sedated  Airway & Oxygen Therapy: Patient Spontanous Breathing and Patient connected to nasal cannula oxygen  Post-op Assessment: Report given to RN and Post -op Vital signs reviewed and stable  Post vital signs: Reviewed and stable  Last Vitals:  Vitals:   04/02/16 1630 04/02/16 1645  BP: 170/71 162/64  Pulse: (!) 53 (!) 59  Resp: 20 23  Temp:      Last Pain:  Vitals:   04/02/16 1508  TempSrc:   PainSc: 2          Complications: No apparent anesthesia complications

## 2016-04-02 NOTE — H&P (Signed)
ORTHOPAEDIC HISTORY AND PHYSICAL   Chief Complaint: Right great toe injury  HPI: Shaun Kennedy is a 76 y.o. male who complains of right great toe injury from a lawnmower earlier today.  He has well controlled DM on metformin.  Does not smoke.  Pain is severe in the right great toe worse with movement.  Ortho consulted.  Past Medical History:  Diagnosis Date  . Allergic rhinitis   . Asthma   . Carotid stenosis   . Diabetes mellitus ([place])   . Diverticulosis   . Dyslipidemia   . ED (erectile dysfunction)   . Hemorrhoids   . HTN (hypertension)   . LVH (left ventricular hypertrophy)    on EKG  . Prostate cancer ([place])   . Smoker    former   Past Surgical History:  Procedure Laterality Date  . COLONOSCOPY  2007   Dr. Benson [place]   Social History   Social History  . Marital status: Married    Spouse name: N/A  . Number of children: N/A  . Years of education: N/A   Social History Main Topics  . Smoking status: Former Smoker    Quit date: 10/09/1987  . Smokeless tobacco: Never Used  . Alcohol use No  . Drug use: No  . Sexual activity: Yes   Other Topics Concern  . None   Social History Narrative   Married, no pets   Family History  Problem Relation Age of Onset  . Heart failure Mother   . Stroke Father    No Known Allergies Prior to Admission medications   Medication Sig Start Date End Date Taking? Authorizing Provider  aspirin 81 MG tablet Take 81 mg by mouth daily.    Historical Provider, MD  Blood Glucose Calibration (ACCU-CHEK AVIVA) SOLN Use as directed Patient not taking: Reported on 04/02/2016 02/16/15   [name], MD  glucose blood test strip Test twice a day. Use for accu-check meter DX E11.9 Patient not taking: Reported on 04/02/2016 08/06/15   [name], MD  ketorolac (ACULAR) 0.5 % ophthalmic solution Place 1 drop into the right eye 4 (four) times daily.  12/24/15   Historical Provider, MD  Lancet Devices [facility]) lancets Test twice a  day DX: E11.9 Patient not taking: Reported on 04/02/2016 08/06/15   [name], MD  lisinopril-hydrochlorothiazide (ZESTORETIC) 20-12.5 MG tablet Take 1 tablet by mouth daily. 03/02/15   [name], MD  metFORMIN (GLUCOPHAGE-XR) 500 MG 24 hr tablet TAKE 1 TABLET ONE TIME DAILY WITH BREAKFAST 01/18/16   [name], MD  mirabegron ER (MYRBETRIQ) 25 MG TB24 tablet Take 1 tablet (25 mg total) by mouth daily. 01/11/16   [name], MD  Multiple Vitamins-Minerals (MULTIVITAMIN WITH MINERALS) tablet Take 1 tablet by mouth daily.    Historical Provider, MD  prednisoLONE acetate (PRED FORTE) 1 % ophthalmic suspension Place 1 drop into the right eye every 2 (two) hours while awake.  12/14/15   Historical Provider, MD  simvastatin (ZOCOR) 40 MG tablet Take 1 tablet (40 mg total) by mouth daily. 03/02/15   [name], MD  VITAMIN D, CHOLECALCIFEROL, PO Take 500 Units by mouth.    Historical Provider, MD   Dg Foot Complete Right  Result Date: 04/02/2016 CLINICAL DATA:  Leary Roca mower injury involving the right great toe 2 hours ago. Initial encounter. EXAM: RIGHT FOOT COMPLETE - 3+ VIEW COMPARISON:  None. FINDINGS: A comminuted fracture of the great toe tuft is  noted. There is no evidence of subluxation or dislocation. No other fractures are noted. No definite radiopaque foreign bodies identified. IMPRESSION: Comminuted tuft fracture of the great toe. Electronically Signed   By: Margarette Canada M.D.   On: 04/02/2016 14:08   - pertinent xrays, CT, MRI studies were reviewed and independently interpreted  Positive ROS: All other systems have been reviewed and were otherwise negative with the exception of those mentioned in the HPI and as above.  Physical Exam: General: Alert, no acute distress Cardiovascular: No pedal edema Respiratory: No cyanosis, no use of accessory musculature GI: No organomegaly, abdomen is soft and non-tender Skin: No lesions in the area of chief complaint Neurologic: Sensation  intact distally Psychiatric: Patient is competent for consent with normal mood and affect Lymphatic: No axillary or cervical lymphadenopathy  MUSCULOSKELETAL:  - traumatic grossly contaminated and macerated toe with exposed distal phalanx - rest of foot is benign  Assessment: Severe open great toe wound from lawnmower  Plan: - gent, pcn, ancef, tetanus given in ER - last po intake was 1 pm - NPO - will need formal I&D - consent obtained - patient and family understand there is potential for partial toe amp given nature of injury - patient is at risk of losing all or part of his toe   N. Eduard Roux, MD [place] 4:57 PM

## 2016-04-02 NOTE — Anesthesia Procedure Notes (Signed)
Procedure Name: Intubation Date/Time: 04/02/2016 6:28 PM Performed by: Mosie Epstein Pre-anesthesia Checklist: Patient identified, Emergency Drugs available, Suction available, Patient being monitored and Timeout performed Patient Re-evaluated:Patient Re-evaluated prior to inductionOxygen Delivery Method: Circle system utilized Preoxygenation: Pre-oxygenation with 100% oxygen Intubation Type: IV induction and Rapid sequence Ventilation: Mask ventilation without difficulty Laryngoscope Size: Mac and 4 Grade View: Grade I Tube type: Oral Tube size: 7.5 mm Number of attempts: 1 Airway Equipment and Method: Stylet Placement Confirmation: ETT inserted through vocal cords under direct vision,  positive ETCO2 and breath sounds checked- equal and bilateral Secured at: 22 cm Tube secured with: Tape Dental Injury: Teeth and Oropharynx as per pre-operative assessment

## 2016-04-02 NOTE — Progress Notes (Signed)
Pharmacy Antibiotic Note  Shaun Kennedy is a 76 y.o. male admitted on 04/02/2016 with R great toe injury mowing the lawn, will require orth surgery. Pharmacy has been consulted for gentamicin dosing for surgical prophylaxis. Pt is also on penicillin and ancef.  Plan: - Gentamicin 400 mg ('5mg'$ /kg ABW) x 1 prior to surgery.  - will f/u after procedure.  Height: '5\' 8"'$  (172.7 cm) Weight: 224 lb (101.6 kg) IBW/kg (Calculated) : 68.4  Temp (24hrs), Avg:97.9 F (36.6 C), Min:97.9 F (36.6 C), Max:97.9 F (36.6 C)  No results for input(s): WBC, CREATININE, LATICACIDVEN, VANCOTROUGH, VANCOPEAK, VANCORANDOM, GENTTROUGH, GENTPEAK, GENTRANDOM, TOBRATROUGH, TOBRAPEAK, TOBRARND, AMIKACINPEAK, AMIKACINTROU, AMIKACIN in the last 168 hours.  CrCl cannot be calculated (Patient's most recent lab result is older than the maximum 21 days allowed.).    No Known Allergies   Thank you for allowing pharmacy to be a part of this patient's care.  Maryanna Shape, PharmD, BCPS  Clinical Pharmacist  Pager: [phone]   04/02/2016 4:16 PM

## 2016-04-02 NOTE — Discharge Instructions (Signed)
Postoperative instructions:  Weightbearing instructions: may weight bear with Darco shoe only  Dressing instructions: Keep your dressing and/or splint clean and dry at all times.  It will be removed at your first post-operative appointment.  Your stitches and/or staples will be removed at this visit.  Incision instructions:  Do not soak your incision for 3 weeks after surgery.  If the incision gets wet, pat dry and do not scrub the incision.  Pain control:  You have been given a prescription to be taken as directed for post-operative pain control.  In addition, elevate the operative extremity above the heart at all times to prevent swelling and throbbing pain.  Take over-the-counter Colace, '100mg'$  by mouth twice a day while taking narcotic pain medications to help prevent constipation.  Follow up appointments: 1) 10-14 days for suture removal and wound check. 2) Dr. Erlinda Hong as scheduled.   -------------------------------------------------------------------------------------------------------------  After Surgery Pain Control:  After your surgery, post-surgical discomfort or pain is likely. This discomfort can last several days to a few weeks. At certain times of the day your discomfort may be more intense.  Did you receive a nerve block?  A nerve block can provide pain relief for one hour to two days after your surgery. As long as the nerve block is working, you will experience little or no sensation in the area the surgeon operated on.  As the nerve block wears off, you will begin to experience pain or discomfort. It is very important that you begin taking your prescribed pain medication before the nerve block fully wears off. Treating your pain at the first sign of the block wearing off will ensure your pain is better controlled and more tolerable when full-sensation returns. Do not wait until the pain is intolerable, as the medicine will be less effective. It is better to treat pain in advance  than to try and catch up.  General Anesthesia:  If you did not receive a nerve block during your surgery, you will need to start taking your pain medication shortly after your surgery and should continue to do so as prescribed by your surgeon.  Pain Medication:  Most commonly we prescribe Vicodin and Percocet for post-operative pain. Both of these medications contain a combination of acetaminophen (Tylenol) and a narcotic to help control pain.   It takes between 30 and 45 minutes before pain medication starts to work. It is important to take your medication before your pain level gets too intense.   Nausea is a common side effect of many pain medications. You will want to eat something before taking your pain medicine to help prevent nausea.   If you are taking a prescription pain medication that contains acetaminophen, we recommend that you do not take additional over the counter acetaminophen (Tylenol).  Other pain relieving options:   Using a cold pack to ice the affected area a few times a day (15 to 20 minutes at a time) can help to relieve pain, reduce swelling and bruising.   Elevation of the affected area can also help to reduce pain and swelling.

## 2016-04-02 NOTE — Op Note (Signed)
   Date of Surgery: 04/02/2016  INDICATIONS: Shaun Kennedy is a 76 y.o.-year-old male with a right great toe partial traumatic amputation from a lawnmower accident;  The patient did consent to the procedure after discussion of the risks and benefits.  PREOPERATIVE DIAGNOSIS: Right great toe partial traumatic amputation  POSTOPERATIVE DIAGNOSIS: Same.  PROCEDURE:  1. Sharp excisional debridement of bone, subcutaneous tissue and skin associated with open fracture of distal phalanx 2. Partial amputation through the distal phalanx of the right great toe with primary closure 3. Adjacent tissue rearrangement 3 cm right great toe  SURGEON: N. Eduard Roux, M.D.  ASSIST: None.  ANESTHESIA:  general  IV FLUIDS AND URINE: See anesthesia.  ESTIMATED BLOOD LOSS: Minimal mL.  IMPLANTS: None  DRAINS: None  COMPLICATIONS: None.  DESCRIPTION OF PROCEDURE: The patient was brought to the operating room and placed supine on the operating table.  The patient had been signed prior to the procedure and this was documented. The patient had the anesthesia placed by the anesthesiologist.  A time-out was performed to confirm that this was the correct patient, site, side and location. The patient did receive antibiotics prior to the incision and was re-dosed during the procedure as needed at indicated intervals.  The patient had the operative extremity prepped and draped in the standard surgical fashion.    I first removed the fracture hematoma from the partial traumatic amputation of the toe. The nail bed and the distal phalanx had been severely traumatized and devitalized along with surrounding soft tissue. I first performed sharp excisional debridement of the devitalized bone, subcutaneous tissue and skin with a knife and [place].  After thorough debridement I irrigated the wound with 6 L of normal saline using cystoscopy tubing. The remaining tissue appeared viable and had good punctate bleeding. I then performed a  partial amputation of the great toe through the distal phalanx. In order to close the traumatic wound primarily I had to perform adjacent tissue rearrangement of approximately 3 cm. This was closed with interrupted 3-0 nylon sutures. Sterile dressings were applied. Patient tolerated the procedure well and had no immediate complications.  POSTOPERATIVE PLAN: Patient will be weight-bear as tolerated to the right lower extremity in a postop shoe. We will see him back in the office in one week for a wound check. We will admit him overnight for IV antibiotics in 2 weeks of oral antibiotics for coverage.  [name], MD [place] 7:23 PM

## 2016-04-03 ENCOUNTER — Encounter (HOSPITAL_COMMUNITY): Payer: Self-pay | Admitting: Orthopaedic Surgery

## 2016-04-03 LAB — GLUCOSE, CAPILLARY
Glucose-Capillary: 109 mg/dL — ABNORMAL HIGH (ref 65–99)
Glucose-Capillary: 114 mg/dL — ABNORMAL HIGH (ref 65–99)
Glucose-Capillary: 151 mg/dL — ABNORMAL HIGH (ref 65–99)

## 2016-04-03 MED ORDER — EPHEDRINE 5 MG/ML INJ
INTRAVENOUS | Status: AC
  Filled 2016-04-03: qty 10

## 2016-04-03 MED ORDER — ARTIFICIAL TEARS OP OINT
TOPICAL_OINTMENT | OPHTHALMIC | Status: AC
  Filled 2016-04-03: qty 3.5

## 2016-04-03 MED ORDER — SUCCINYLCHOLINE CHLORIDE 200 MG/10ML IV SOSY
PREFILLED_SYRINGE | INTRAVENOUS | Status: AC
  Filled 2016-04-03: qty 20

## 2016-04-03 MED ORDER — PHENYLEPHRINE 40 MCG/ML (10ML) SYRINGE FOR IV PUSH (FOR BLOOD PRESSURE SUPPORT)
PREFILLED_SYRINGE | INTRAVENOUS | Status: AC
  Filled 2016-04-03: qty 20

## 2016-04-03 MED ORDER — LIDOCAINE 2% (20 MG/ML) 5 ML SYRINGE
INTRAMUSCULAR | Status: AC
  Filled 2016-04-03: qty 5

## 2016-04-03 MED ORDER — SUGAMMADEX SODIUM 200 MG/2ML IV SOLN
INTRAVENOUS | Status: AC
  Filled 2016-04-03: qty 12

## 2016-04-03 MED ORDER — ROCURONIUM BROMIDE 50 MG/5ML IV SOSY
PREFILLED_SYRINGE | INTRAVENOUS | Status: AC
  Filled 2016-04-03: qty 10

## 2016-04-03 MED ORDER — LABETALOL HCL 5 MG/ML IV SOLN
INTRAVENOUS | Status: AC
  Filled 2016-04-03: qty 4

## 2016-04-03 MED ORDER — DEXMEDETOMIDINE HCL IN NACL 200 MCG/50ML IV SOLN
INTRAVENOUS | Status: AC
  Filled 2016-04-03: qty 100

## 2016-04-03 NOTE — Progress Notes (Signed)
Orthopedic Tech Progress Note Patient Details:  Shaun Kennedy 1940/03/28 [id number]  Ortho Devices Type of Ortho Device: Postop shoe/boot Ortho Device/Splint Location: Darco shoe Ortho Device/Splint Interventions: Application   [place] Pink 04/03/2016, 10:56 AM

## 2016-04-03 NOTE — Care Management Note (Signed)
Case Management Note  Patient Details  Name: Shaun Kennedy MRN: [id number] Date of Birth: 10-Jun-1940  Subjective/Objective:                  Trauma/toe amputation Action/Plan: Discharge planning Expected Discharge Date:  04/03/16               Expected Discharge Plan:  Home/Self Care  In-House Referral:     Discharge planning Services  CM Consult, NA  Post Acute Care Choice:  NA Choice offered to:  NA  DME Arranged:  N/A DME Agency:  NA  HH Arranged:  NA HH Agency:  NA  Status of Service:  Completed, signed off  If discussed at [place] of Stay Meetings, dates discussed:    Additional Comments: CM notes consult placed for [facility] needs and DME.  No HH services ordered nor recc.  NO DME ordered nor recc.  No other Cm needs were communicated. Dellie Catholic, RN 04/03/2016, 12:15 PM

## 2016-04-03 NOTE — Progress Notes (Signed)
   Subjective:  Patient reports pain as mild.    Objective:   VITALS:   Vitals:   04/02/16 1953 04/02/16 1957 04/02/16 2044 04/03/16 0432  BP: 132/62 139/63 (!) 140/56 (!) 110/47  Pulse: 78 93 (!) 59 (!) 54  Resp: 20     Temp:  97.4 F (36.3 C) 97.6 F (36.4 C) 98.3 F (36.8 C)  TempSrc:   Oral Oral  SpO2:   95% 94%  Weight:      Height:        Neurologically intact Neurovascular intact Sensation intact distally Intact pulses distally Dorsiflexion/Plantar flexion intact Incision: dressing C/D/I and no drainage No cellulitis present Compartment soft   Lab Results  Component Value Date   WBC 7.3 04/02/2016   HGB 12.2 (L) 04/02/2016   HCT 36.0 (L) 04/02/2016   MCV 92.5 04/02/2016   PLT 242 04/02/2016     Assessment/Plan:  1 Day Post-Op   - Expected postop acute blood loss anemia - will monitor for symptoms - Up with PT/OT - cleared PT - DVT ppx - SCDs, ambulation - WBAT to heel in darco shoe operative extremity - Pain control - Discharge planning - home today - 2 weeks of cipro  Eduard Roux 04/03/2016, 9:21 AM [phone]

## 2016-04-03 NOTE — Discharge Summary (Signed)
Physician Discharge Summary      Patient ID: Shaun Kennedy MRN: [id number] DOB/AGE: 10-04-1940 76 y.o.  Admit date: 04/02/2016 Discharge date: 04/03/2016  Admission Diagnoses:  <principal problem not specified>  Discharge Diagnoses:  Active Problems:   Open wound of right great toe   Past Medical History:  Diagnosis Date  . Allergic rhinitis   . Asthma   . Carotid stenosis   . Diabetes mellitus ([place])   . Diverticulosis   . Dyslipidemia   . ED (erectile dysfunction)   . Hemorrhoids   . HTN (hypertension)   . LVH (left ventricular hypertrophy)    on EKG  . Prostate cancer ([place])   . Smoker    former    Surgeries: Procedure(s): IRRIGATION AND DEBRIDEMENT GREAT TOE on 04/02/2016   Consultants (if any):   Discharged Condition: Improved  Hospital Course: Shaun Kennedy is an 76 y.o. male who was admitted 04/02/2016 with a diagnosis of <principal problem not specified> and went to the operating room on 04/02/2016 and underwent the above named procedures.    He was given perioperative antibiotics:  Anti-infectives    Start     Dose/Rate Route Frequency Ordered Stop   04/03/16 0430  ceFAZolin (ANCEF) IVPB 2g/100 mL premix     2 g 200 mL/hr over 30 Minutes Intravenous Every 6 hours 04/02/16 2329 04/03/16 2229   04/02/16 1630  gentamicin (GARAMYCIN) 400 mg in dextrose 5 % 50 mL IVPB     400 mg 120 mL/hr over 30 Minutes Intravenous  Once 04/02/16 1615 04/02/16 1741   04/02/16 1600  penicillin G potassium 2 Million Units in dextrose 5 % 50 mL IVPB     2 Million Units 100 mL/hr over 30 Minutes Intravenous  Once 04/02/16 1550 04/02/16 1740   04/02/16 1530  ceFAZolin (ANCEF) IVPB 1 g/50 mL premix  Status:  Discontinued     1 g 100 mL/hr over 30 Minutes Intravenous Every 8 hours 04/02/16 1518 04/02/16 1519   04/02/16 1530  ceFAZolin (ANCEF) IVPB 2g/100 mL premix  Status:  Discontinued     2 g 200 mL/hr over 30 Minutes Intravenous Every 8 hours 04/02/16 1519 04/02/16 2338   04/02/16 0000  ciprofloxacin (CIPRO) 500 MG tablet     500 mg Oral 2 times daily 04/02/16 1933      .  He was given sequential compression devices, early ambulation for DVT prophylaxis.  He benefited maximally from the hospital stay and there were no complications.    Recent vital signs:  Vitals:   04/02/16 2044 04/03/16 0432  BP: (!) 140/56 (!) 110/47  Pulse: (!) 59 (!) 54  Resp:    Temp: 97.6 F (36.4 C) 98.3 F (36.8 C)    Recent laboratory studies:  Lab Results  Component Value Date   HGB 12.2 (L) 04/02/2016   HGB 11.9 (L) 04/02/2016   HGB 12.1 (L) 07/22/2015   Lab Results  Component Value Date   WBC 7.3 04/02/2016   PLT 242 04/02/2016   No results found for: INR Lab Results  Component Value Date   NA 142 04/02/2016   K 3.7 04/02/2016   CL 102 04/02/2016   CO2 17 (L) 07/22/2015   BUN 17 04/02/2016   CREATININE 1.20 04/02/2016   GLUCOSE 104 (H) 04/02/2016    Discharge Medications:   Allergies as of 04/03/2016   No Known Allergies     Medication List    TAKE these medications   ACCU-CHEK AVIVA Soln  Use as directed   accu-chek softclix lancets Test twice a day DX: E11.9   aspirin EC 81 MG tablet Take 81 mg by mouth daily.   cholecalciferol 1000 units tablet Commonly known as:  VITAMIN D Take 1,000 Units by mouth daily.   ciprofloxacin 500 MG tablet Commonly known as:  CIPRO Take 1 tablet (500 mg total) by mouth 2 (two) times daily.   glucose blood test strip Test twice a day. Use for accu-check meter DX E11.9   lisinopril-hydrochlorothiazide 20-12.5 MG tablet Commonly known as:  ZESTORETIC Take 1 tablet by mouth daily.   metFORMIN 500 MG 24 hr tablet Commonly known as:  GLUCOPHAGE-XR TAKE 1 TABLET ONE TIME DAILY WITH BREAKFAST   mirabegron ER 25 MG Tb24 tablet Commonly known as:  MYRBETRIQ Take 1 tablet (25 mg total) by mouth daily.   multivitamin with minerals Tabs tablet Take 1 tablet by mouth daily.   oxyCODONE-acetaminophen  5-325 MG tablet Commonly known as:  PERCOCET Take 1-2 tablets by mouth every 4 (four) hours as needed for severe pain.   promethazine 25 MG tablet Commonly known as:  PHENERGAN Take 1 tablet (25 mg total) by mouth every 6 (six) hours as needed for nausea.   senna-docusate 8.6-50 MG tablet Commonly known as:  SENOKOT S Take 1 tablet by mouth at bedtime as needed.   simvastatin 40 MG tablet Commonly known as:  ZOCOR Take 1 tablet (40 mg total) by mouth daily.            Durable Medical Equipment        Start     Ordered   04/02/16 2330  DME Walker rolling  Once    Question:  Patient needs a walker to treat with the following condition  Answer:  Toe amputation status, right ([place])   04/02/16 2329   04/02/16 2330  DME 3 n 1  Once     04/02/16 2329      Diagnostic Studies: Dg Chest Portable 1 View  Result Date: 04/02/2016 CLINICAL DATA:  Great toe fracture.  Preop respiratory exam. EXAM: PORTABLE CHEST 1 VIEW COMPARISON:  02/04/2015 FINDINGS: The heart size and mediastinal contours are within normal limits. Aortic atherosclerosis. Both lungs are clear. The visualized skeletal structures are unremarkable. IMPRESSION: No active disease. Electronically Signed   By: Earle Gell M.D.   On: 04/02/2016 17:37   Dg Foot Complete Right  Result Date: 04/02/2016 CLINICAL DATA:  Leary Roca mower injury involving the right great toe 2 hours ago. Initial encounter. EXAM: RIGHT FOOT COMPLETE - 3+ VIEW COMPARISON:  None. FINDINGS: A comminuted fracture of the great toe tuft is noted. There is no evidence of subluxation or dislocation. No other fractures are noted. No definite radiopaque foreign bodies identified. IMPRESSION: Comminuted tuft fracture of the great toe. Electronically Signed   By: Margarette Canada M.D.   On: 04/02/2016 14:08    Disposition: 01-Home or Self Care  Discharge Instructions    Call MD / Call 911    Complete by:  As directed    If you experience chest pain or shortness of  breath, CALL 911 and be transported to the hospital emergency room.  If you develope a fever above 101.5 F, pus (white drainage) or increased drainage or redness at the wound, or calf pain, call your surgeon's office.   Constipation Prevention    Complete by:  As directed    Drink plenty of fluids.  Prune juice may be helpful.  You may  use a stool softener, such as Colace (over the counter) 100 mg twice a day.  Use MiraLax (over the counter) for constipation as needed.   Driving restrictions    Complete by:  As directed    No driving while taking narcotic pain meds.   Increase activity slowly as tolerated    Complete by:  As directed       Follow-up Information    Eduard Roux, MD In 1 week.   Specialty:  Orthopedic Surgery Why:  For wound re-check Contact information: [place] [address] [phone]            Signed: Eduard Roux 04/03/2016, 9:23 AM

## 2016-04-03 NOTE — Progress Notes (Signed)
Patient discharged to home with belongings, IVs and tele removed. AVS and prescriptions given, all questions answered. Patient stable at time of discharge.

## 2016-04-03 NOTE — Evaluation (Signed)
Physical Therapy Evaluation Patient Details Name: Shaun Kennedy MRN: [id number] DOB: [birth year] Today's Date: 04/03/2016   History of Present Illness  Pt is a 76 yo male admitted through ED on 04/02/16 following a lawnmower accident resulting in partial right great toe amputation. Pt underwent a complete great toe ampuation on 04/02/16. PMH significant for Asthma, DM2, HTN, LVH, Prostate CA.  Clinical Impression  Pt is POD 1 following the above procedure. Prior to admission, pt was completely independent and golfing at least 2x a week. Pt lives with his wife in a single level home with steps leading into the front door. Pt performed at a Min guard to supervision level this session with improvements in mobility noted as distance increases. Pt will benefit from continued acute PT services in order to assess gait with a [place] and assess stair negotiation prior to discharge.     Follow Up Recommendations No PT follow up    Equipment Recommendations  None recommended by PT;Other (comment) (Pt reports he has all equipment at home)    Recommendations for Other Services       Precautions / Restrictions Precautions Precautions: None Required Braces or Orthoses: Other Brace/Splint Other Brace/Splint: post op shoe Restrictions Weight Bearing Restrictions: Yes RLE Weight Bearing: Weight bearing as tolerated      Mobility  Bed Mobility Overal bed mobility: Needs Assistance Bed Mobility: Supine to Sit     Supine to sit: Supervision;HOB elevated     General bed mobility comments: Able to get EOB without any hand on assistance  Transfers Overall transfer level: Needs assistance   Transfers: Sit to/from Stand Sit to Stand: Supervision         General transfer comment: Supervision for safety with cues for hand placement  Ambulation/Gait Ambulation/Gait assistance: Supervision;Min guard Ambulation Distance (Feet): 250 Feet Assistive device: Rolling walker (2 wheeled) Gait  Pattern/deviations: Step-through pattern;Antalgic Gait velocity: decreased Gait velocity interpretation: Below normal speed for age/gender General Gait Details: Mild antalgic gait with decreased weight shift to right. Improved sequencing and decreased assistance as distance increases.   Stairs            Wheelchair Mobility    Modified Rankin (Stroke Patients Only)       Balance Overall balance assessment: No apparent balance deficits (not formally assessed)                                           Pertinent Vitals/Pain Pain Assessment: No/denies pain    Home Living Family/patient expects to be discharged to:: Private residence Living Arrangements: Spouse/significant other Available Help at Discharge: Family;Available 24 hours/day Type of Home: House Home Access: Stairs to enter Entrance Stairs-Rails: Psychiatric nurse of Steps: 5 Home Layout: One level Home Equipment: Crutches;Cane - single point;Walker - 4 wheels;Shower seat;Hand held shower head      Prior Function Level of Independence: Independent         Comments: was completely indepedent, driving and doing all own activities     Hand Dominance   Dominant Hand: Left    Extremity/Trunk Assessment   Upper Extremity Assessment Upper Extremity Assessment: Overall WFL for tasks assessed    Lower Extremity Assessment Lower Extremity Assessment: Overall WFL for tasks assessed    Cervical / Trunk Assessment Cervical / Trunk Assessment: Normal  Communication   Communication: No difficulties  Cognition Arousal/Alertness: Awake/alert Behavior During Therapy: [facility]  for tasks assessed/performed Overall Cognitive Status: Within Functional Limits for tasks assessed                      General Comments      Exercises     Assessment/Plan    PT Assessment Patient needs continued PT services  PT Problem List Decreased strength;Decreased range of  motion;Decreased activity tolerance;Decreased balance;Decreased mobility;Decreased knowledge of use of DME       PT Treatment Interventions DME instruction;Gait training;Stair training;Functional mobility training;Therapeutic activities;Therapeutic exercise;Balance training;Patient/family education    PT Goals (Current goals can be found in the Care Plan section)  Acute Rehab PT Goals Patient Stated Goal: to get back to golfing PT Goal Formulation: With patient Time For Goal Achievement: 04/10/16 Potential to Achieve Goals: Good    Frequency Min 5X/week   Barriers to discharge        Co-evaluation               End of Session Equipment Utilized During Treatment: Gait belt Activity Tolerance: Patient tolerated treatment well Patient left: in chair;with call bell/phone within reach Nurse Communication: Mobility status PT Visit Diagnosis: Difficulty in walking, not elsewhere classified (R26.2)    Functional Assessment Tool Used: AM-PAC 6 Clicks Basic Mobility;Clinical judgement Functional Limitation: Mobility: Walking and moving around Mobility: Walking and Moving Around Current Status (Y7829): At least 1 percent but less than 20 percent impaired, limited or restricted Mobility: Walking and Moving Around Goal Status [phone]): 0 percent impaired, limited or restricted    Time: 0800-0825 PT Time Calculation (min) (ACUTE ONLY): 25 min   Charges:   PT Evaluation $PT Eval Low Complexity: 1 Procedure PT Treatments $Gait Training: 8-22 mins   PT G Codes:   PT G-Codes **NOT FOR INPATIENT CLASS** Functional Assessment Tool Used: AM-PAC 6 Clicks Basic Mobility;Clinical judgement Functional Limitation: Mobility: Walking and moving around Mobility: Walking and Moving Around Current Status (Y8657): At least 1 percent but less than 20 percent impaired, limited or restricted Mobility: Walking and Moving Around Goal Status [phone]): 0 percent impaired, limited or restricted      Scheryl Marten PT, DPT  [phone]  04/03/2016, 8:34 AM

## 2016-04-03 NOTE — Progress Notes (Signed)
CSW c/s for SNF, noted.  Pt to d/c home today with no PT f/u.  Creta Levin, LCSW Weekend Coverage [phone]

## 2016-04-03 NOTE — Progress Notes (Signed)
Physical Therapy Treatment Patient Details Name: Shaun Kennedy MRN: [id number] DOB: 1941-01-03 Today's Date: 04/03/2016    History of Present Illness Pt is a 76 yo male admitted through ED on 04/02/16 following a lawnmower accident resulting in partial right great toe amputation. Pt underwent a complete great toe ampuation on 04/02/16. PMH significant for Asthma, DM2, HTN, LVH, Prostate CA.    PT Comments    Performed gait and stair negotiation with Darco shoe. Pt is unsteady and requires RW for stability with gait. Pt is able to perform same gait distance and stair negotiation with increased assistance noted this session. Pt advised to use RW for all gait throughout home until he becomes more comfortable with orthotic device.     Follow Up Recommendations  No PT follow up     Equipment Recommendations  None recommended by PT;Other (comment)    Recommendations for Other Services       Precautions / Restrictions Precautions Precautions: None Required Braces or Orthoses: Other Brace/Splint Other Brace/Splint: Darco shoe Restrictions Weight Bearing Restrictions: Yes RLE Weight Bearing: Weight bearing as tolerated    Mobility  Bed Mobility Overal bed mobility: Needs Assistance Bed Mobility: Supine to Sit     Supine to sit: Supervision;HOB elevated     General bed mobility comments: In recliner when PT arrives  Transfers Overall transfer level: Needs assistance Equipment used: Rolling walker (2 wheeled) Transfers: Sit to/from Stand Sit to Stand: Supervision         General transfer comment: Cues for pushing up from recliner and not using RW when standing  Ambulation/Gait Ambulation/Gait assistance: Supervision;Min guard Ambulation Distance (Feet): 250 Feet Assistive device: Rolling walker (2 wheeled) Gait Pattern/deviations: Step-through pattern;Antalgic Gait velocity: decreased Gait velocity interpretation: Below normal speed for age/gender General Gait  Details: Mild antalgic gait with decreased weight shift to right. Improved sequencing and decreased assistance as distance increases. Has some increased instability with Darco shoe and relys on RW for balance.     Stairs Stairs: Yes   Stair Management: One rail Right;Step to pattern;Forwards Number of Stairs: 3 General stair comments: min cues for sequencing  Wheelchair Mobility    Modified Rankin (Stroke Patients Only)       Balance Overall balance assessment: Needs assistance Sitting-balance support: No upper extremity supported;Feet supported Sitting balance-Leahy Scale: Normal     Standing balance support: No upper extremity supported Standing balance-Leahy Scale: Fair Standing balance comment: unsteady with Darco Shoe                    Cognition Arousal/Alertness: Awake/alert Behavior During Therapy: WFL for tasks assessed/performed Overall Cognitive Status: Within Functional Limits for tasks assessed                      Exercises      General Comments        Pertinent Vitals/Pain Pain Assessment: No/denies pain    Home Living                      Prior Function            PT Goals (current goals can now be found in the care plan section) Acute Rehab PT Goals Patient Stated Goal: to get back to golfing PT Goal Formulation: With patient Time For Goal Achievement: 04/10/16 Potential to Achieve Goals: Good Progress towards PT goals: Progressing toward goals    Frequency    Min 5X/week  PT Plan Current plan remains appropriate    Co-evaluation             End of Session Equipment Utilized During Treatment: Gait belt Activity Tolerance: Patient tolerated treatment well Patient left: in chair;with call bell/phone within reach;with family/visitor present Nurse Communication: Mobility status PT Visit Diagnosis: Difficulty in walking, not elsewhere classified (R26.2)     Time: 3837-7939 PT Time Calculation  (min) (ACUTE ONLY): 10 min  Charges:  $Gait Training: 8-22 mins                    G Codes:  Functional Assessment Tool Used: AM-PAC 6 Clicks Basic Mobility;Clinical judgement Functional Limitation: Mobility: Walking and moving around Mobility: Walking and Moving Around Current Status (S8864): At least 1 percent but less than 20 percent impaired, limited or restricted Mobility: Walking and Moving Around Goal Status [phone]): 0 percent impaired, limited or restricted    Scheryl Marten PT, DPT  [phone]  04/03/2016, 12:12 PM

## 2016-04-04 NOTE — Progress Notes (Signed)
Have him schedule an appointment for follow-up

## 2016-04-05 ENCOUNTER — Encounter: Payer: Self-pay | Admitting: Family Medicine

## 2016-04-05 ENCOUNTER — Ambulatory Visit (INDEPENDENT_AMBULATORY_CARE_PROVIDER_SITE_OTHER): Payer: Medicare HMO | Admitting: Family Medicine

## 2016-04-05 VITALS — BP 130/84 | HR 60 | Ht 68.0 in | Wt 231.0 lb

## 2016-04-05 DIAGNOSIS — G5601 Carpal tunnel syndrome, right upper limb: Secondary | ICD-10-CM

## 2016-04-05 DIAGNOSIS — E119 Type 2 diabetes mellitus without complications: Secondary | ICD-10-CM

## 2016-04-05 DIAGNOSIS — E1159 Type 2 diabetes mellitus with other circulatory complications: Secondary | ICD-10-CM | POA: Diagnosis not present

## 2016-04-05 DIAGNOSIS — E1169 Type 2 diabetes mellitus with other specified complication: Secondary | ICD-10-CM

## 2016-04-05 DIAGNOSIS — N3941 Urge incontinence: Secondary | ICD-10-CM

## 2016-04-05 DIAGNOSIS — I152 Hypertension secondary to endocrine disorders: Secondary | ICD-10-CM

## 2016-04-05 DIAGNOSIS — I10 Essential (primary) hypertension: Secondary | ICD-10-CM | POA: Diagnosis not present

## 2016-04-05 DIAGNOSIS — N3281 Overactive bladder: Secondary | ICD-10-CM

## 2016-04-05 DIAGNOSIS — E785 Hyperlipidemia, unspecified: Secondary | ICD-10-CM | POA: Diagnosis not present

## 2016-04-05 LAB — POCT GLYCOSYLATED HEMOGLOBIN (HGB A1C): Hemoglobin A1C: 6.5

## 2016-04-05 MED ORDER — MIRABEGRON ER 25 MG PO TB24: 25.0000 mg | ORAL_TABLET | Freq: Every day | ORAL | 3 refills | Status: DC

## 2016-04-05 NOTE — Progress Notes (Signed)
  Subjective:    Patient ID: Shaun Kennedy, male    DOB: Feb 16, 1940, 76 y.o.   MRN: [id number]  Shaun Kennedy is a 76 y.o. male who presents for follow-up of Type 2 diabetes mellitus.  Patient is checking home blood sugars.   Home blood sugar records: 95 to 128 How often is blood sugars being checked: BID Current symptoms/problems none Daily foot checks: yes   Any foot concerns: Recent surgery on the right great toe from partial amputation of the nail  Last eye exam: 12/2015 Exercise: golf,walking He was recently evaluated for possible CTS and this did indeed show up in the right wrist. Also he did not get his Myrbetriq filled and will like to send it again. He did take it as a sample found that to be helpful with his OAB. Presently he is taking Cipro and on pain meds for the foot surgery. He continues on Zestoretic without problems. He also is taking metformin and having no difficulty with that. Simvastatin is also being used. The following portions of the patient's history were reviewed and updated as appropriate: allergies, current medications, past medical history, past social history and problem list.  ROS as in subjective above.     Objective:    Physical Exam Alert and in no distress otherwise not examined.  Lab Review Diabetic Labs Latest Ref Rng & Units 04/02/2016 12/02/2015 07/22/2015 10/29/2014 06/25/2014  HbA1c - - 6.9 6.5 6.8 6.6%  Microalbumin mg/dL - - - - -  Micro/Creat Ratio - - - - - -  Chol 125 - 200 mg/dL - - 147 - 160  HDL >=40 mg/dL - - 83 - 75  Calc LDL <130 mg/dL - - 49 - 71  Triglycerides <150 mg/dL - - 75 - 69  Creatinine 0.61 - 1.24 mg/dL 1.20 - 1.14 - 1.05   BP/Weight 04/05/2016 04/03/2016 04/02/2016 02/04/2016 74/10/4494  Systolic BP 759 163 - 846 659  Diastolic BP 84 47 - 70 62  Wt. (Lbs) 231 - 224 232 230  BMI 35.12 - 34.06 35.28 34.97   Foot/eye exam completion dates Latest Ref Rng & Units 07/22/2015 03/11/2015  Eye Exam No Retinopathy - No Retinopathy    Foot Form Completion - Done -   A1c is 6.5 Shaun Kennedy  reports that he quit smoking about 28 years ago. He has never used smokeless tobacco. He reports that he does not drink alcohol or use drugs.     Assessment & Plan:    Hyperlipidemia associated with type 2 diabetes mellitus (HCC)  Urge incontinence - Plan: mirabegron ER (MYRBETRIQ) 25 MG TB24 tablet  OAB (overactive bladder) - Plan: mirabegron ER (MYRBETRIQ) 25 MG TB24 tablet  Type 2 diabetes mellitus without complication, without long-term current use of insulin (HCC)  Hypertension associated with diabetes ([place])  Carpal tunnel syndrome of right wrist    1. Rx changes: none 2. Education: Reviewed 'ABCs' of diabetes management (respective goals in parentheses):  A1C (<7), blood pressure (<130/80), and cholesterol (LDL <100). 3. Compliance at present is estimated to be good. Efforts to improve compliance (if necessary) will be directed at increased exercise. 4. Follow up: 4 months  5. Also discussed treatment of CTS. Recommended splint for several weeks to see if that will help and if not, come back for possible injection and if still no improvement, referral to orthopedics.

## 2016-04-08 ENCOUNTER — Ambulatory Visit (INDEPENDENT_AMBULATORY_CARE_PROVIDER_SITE_OTHER): Payer: Medicare HMO | Admitting: Orthopaedic Surgery

## 2016-04-08 DIAGNOSIS — S91101D Unspecified open wound of right great toe without damage to nail, subsequent encounter: Secondary | ICD-10-CM

## 2016-04-08 NOTE — Progress Notes (Signed)
[name] comes back today a week status post partial right great toe amputation from a lawnmower injury. He is doing well. The surgical incision is intact. He does have viability of the surgical wound. No complications. I'll see him back in 2 weeks. He is to do daily dressing changes.

## 2016-04-13 ENCOUNTER — Telehealth: Payer: Self-pay

## 2016-04-13 ENCOUNTER — Other Ambulatory Visit: Payer: Self-pay

## 2016-04-13 DIAGNOSIS — N3281 Overactive bladder: Secondary | ICD-10-CM

## 2016-04-13 DIAGNOSIS — N3941 Urge incontinence: Secondary | ICD-10-CM

## 2016-04-13 DIAGNOSIS — E119 Type 2 diabetes mellitus without complications: Secondary | ICD-10-CM

## 2016-04-13 DIAGNOSIS — E1159 Type 2 diabetes mellitus with other circulatory complications: Secondary | ICD-10-CM

## 2016-04-13 DIAGNOSIS — I152 Hypertension secondary to endocrine disorders: Secondary | ICD-10-CM

## 2016-04-13 DIAGNOSIS — I10 Essential (primary) hypertension: Secondary | ICD-10-CM

## 2016-04-13 MED ORDER — GLUCOSE BLOOD VI STRP: ORAL_STRIP | 2 refills | Status: DC

## 2016-04-13 MED ORDER — SIMVASTATIN 40 MG PO TABS: 40.0000 mg | ORAL_TABLET | Freq: Every day | ORAL | 3 refills | Status: DC

## 2016-04-13 MED ORDER — LISINOPRIL-HYDROCHLOROTHIAZIDE 20-12.5 MG PO TABS: 1.0000 | ORAL_TABLET | Freq: Every day | ORAL | 3 refills | Status: DC

## 2016-04-13 MED ORDER — METFORMIN HCL ER 500 MG PO TB24: ORAL_TABLET | ORAL | 1 refills | Status: DC

## 2016-04-13 MED ORDER — MIRABEGRON ER 25 MG PO TB24: 25.0000 mg | ORAL_TABLET | Freq: Every day | ORAL | 3 refills | Status: DC

## 2016-04-13 MED ORDER — ACCU-CHEK SOFTCLIX LANCET DEV MISC: 2 refills | Status: DC

## 2016-04-13 MED ORDER — ACCU-CHEK AVIVA VI SOLN: 0 refills | Status: DC

## 2016-04-13 NOTE — Telephone Encounter (Signed)
Pt states that he has changed pharmacies to [facility] and needs all medications since here so he can get refills. Thank you, RLB

## 2016-04-13 NOTE — Telephone Encounter (Signed)
All meds sent to [facility]

## 2016-04-15 DIAGNOSIS — R69 Illness, unspecified: Secondary | ICD-10-CM | POA: Diagnosis not present

## 2016-04-22 ENCOUNTER — Ambulatory Visit (INDEPENDENT_AMBULATORY_CARE_PROVIDER_SITE_OTHER): Payer: Medicare HMO | Admitting: Orthopaedic Surgery

## 2016-04-22 ENCOUNTER — Encounter (INDEPENDENT_AMBULATORY_CARE_PROVIDER_SITE_OTHER): Payer: Self-pay | Admitting: Orthopaedic Surgery

## 2016-04-22 DIAGNOSIS — S91101D Unspecified open wound of right great toe without damage to nail, subsequent encounter: Secondary | ICD-10-CM

## 2016-04-22 NOTE — Progress Notes (Signed)
Patient comes back today for wound check. He is 3 weeks status post partial great toe amputation and wound closure from a lawnmower injury. He is doing well. Denies any constitutional symptoms. The physical exam is benign. The incision is healed without signs of infection. He has expected postoperative swelling. The sutures were removed today. Begin weightbearing in a postop shoe as tolerated. I will like to see him back in about 4 weeks for recheck.

## 2016-04-24 DIAGNOSIS — R69 Illness, unspecified: Secondary | ICD-10-CM | POA: Diagnosis not present

## 2016-04-27 ENCOUNTER — Telehealth: Payer: Self-pay | Admitting: Family Medicine

## 2016-04-27 NOTE — Telephone Encounter (Signed)
Pt said the Mirabegron ER is over $300  So he wants to know if we can help him get the price down or if not can the med be changed to something less expensive?

## 2016-04-28 ENCOUNTER — Other Ambulatory Visit: Payer: Self-pay | Admitting: Family Medicine

## 2016-04-28 MED ORDER — OXYBUTYNIN CHLORIDE ER 5 MG PO TB24: 5.0000 mg | ORAL_TABLET | Freq: Every day | ORAL | 1 refills | Status: DC

## 2016-04-28 NOTE — Telephone Encounter (Signed)
Pt states he has not tried any other medications.  Per chart was new start as of 12/02/15. Unable to use discount card for Myrbetriq because pt has [employer].  [facility] [phone] and medication cost is $300 which $72 of that is deductible, but is Tier 4,  Tried for tiering exception but pt needs trial and failure of the following Oxybutynin ER, Lisbeth Ply which are Tier 3 or Oxybutynin IR or Trospium which are Tier 2 which would be least expensive.   Can you switch pt to Oxybutynin IR bid or Trospium to lower his cost ?

## 2016-04-28 NOTE — Telephone Encounter (Signed)
Left message on VM that Dr.Lalonde has called in med to please call and let us know if the med works for him

## 2016-04-28 NOTE — Telephone Encounter (Signed)
Explain this to the patient and put him on oxybutynin. I will call in a month worth.

## 2016-05-03 ENCOUNTER — Encounter: Payer: Self-pay | Admitting: Family Medicine

## 2016-05-03 ENCOUNTER — Ambulatory Visit (INDEPENDENT_AMBULATORY_CARE_PROVIDER_SITE_OTHER): Payer: Medicare HMO | Admitting: Family Medicine

## 2016-05-03 VITALS — BP 128/80 | HR 60 | Wt 227.0 lb

## 2016-05-03 DIAGNOSIS — G5601 Carpal tunnel syndrome, right upper limb: Secondary | ICD-10-CM

## 2016-05-03 DIAGNOSIS — M79641 Pain in right hand: Secondary | ICD-10-CM | POA: Diagnosis not present

## 2016-05-03 MED ORDER — LIDOCAINE HCL 2 % IJ SOLN
3.0000 mL | Freq: Once | INTRAMUSCULAR | Status: AC
  Administered 2016-05-03: 60 mg via INTRADERMAL

## 2016-05-03 MED ORDER — TRIAMCINOLONE ACETONIDE 40 MG/ML IJ SUSP
40.0000 mg | Freq: Once | INTRAMUSCULAR | Status: AC
  Administered 2016-05-03: 40 mg via INTRAMUSCULAR

## 2016-05-03 NOTE — Progress Notes (Signed)
   Subjective:    Patient ID: Shaun Kennedy, male    DOB: Jul 23, 1940, 76 y.o.   MRN: [id number]  HPI Here for reevaluation of his right wrist. He did have an EMG study on it which did show median neuropathy and also specifically stated no ulnar issues. His symptoms today are numbness to the fourth and fifth fingers and no difficulty with the median nerve distribution part of the hand.   Review of Systems     Objective:   Physical Exam Decreased sensation is noted over the fourth and fifth fingers. Strength was normal. Sensation was normal over the median nerve distribution. Tinel and Phalen's test was not done. Stimulated the ulnar notch and cause no symptoms.       Assessment & Plan:  Right hand pain - Plan: triamcinolone acetonide (KENALOG-40) injection 40 mg, lidocaine (XYLOCAINE) 2 % (with pres) injection 60 mg  Carpal tunnel syndrome of right wrist I explained the discrepancy between the EMG and his clinical presentation however he states that he would still like to shot to see if it would help. The wrist was prepped with Betadine. The anatomy of the hand was identified. The shot was given just lateral to the palmaris longus tendon into the carpal tunnel space without difficulty. He tolerated the procedure well. He will let me know if this helps with his symptoms and if not I will refer to hand surgery for further evaluation since his symptoms are really more ulnar than median nerve.

## 2016-05-23 ENCOUNTER — Encounter: Payer: Self-pay | Admitting: Family Medicine

## 2016-05-23 ENCOUNTER — Ambulatory Visit (INDEPENDENT_AMBULATORY_CARE_PROVIDER_SITE_OTHER): Payer: Medicare HMO | Admitting: Family Medicine

## 2016-05-23 VITALS — BP 120/70 | Wt 224.0 lb

## 2016-05-23 DIAGNOSIS — G5601 Carpal tunnel syndrome, right upper limb: Secondary | ICD-10-CM | POA: Diagnosis not present

## 2016-05-23 NOTE — Progress Notes (Signed)
   Subjective:    Patient ID: Shaun Kennedy, male    DOB: 1940-06-29, 76 y.o.   MRN: [id number]  HPI He is here for a recheck. He did have an injection for CTS which he states has really had no effect. He again points to the fourth and fifth fingers as to decreased sensation. Previous EMG did show evidence of CTS although today symptoms are more ulnar.   Review of Systems     Objective:   Physical Exam alert and in no distress. Decreased sensation to the fourth and fifth fingers. Tinel and Phalen's test was negative. Tinel test over the ulnar notch did reproduce his symptoms      Assessment & Plan:  Carpal tunnel syndrome of right wrist  Although his symptoms are more ulnar neuropathy the EMG showed evidence of CTS. I will refer to orthopedic hand surgery to have this further evaluated.

## 2016-06-01 DIAGNOSIS — G5603 Carpal tunnel syndrome, bilateral upper limbs: Secondary | ICD-10-CM | POA: Diagnosis not present

## 2016-06-01 DIAGNOSIS — M47812 Spondylosis without myelopathy or radiculopathy, cervical region: Secondary | ICD-10-CM | POA: Diagnosis not present

## 2016-06-01 DIAGNOSIS — G5621 Lesion of ulnar nerve, right upper limb: Secondary | ICD-10-CM | POA: Diagnosis not present

## 2016-06-03 ENCOUNTER — Ambulatory Visit (INDEPENDENT_AMBULATORY_CARE_PROVIDER_SITE_OTHER): Payer: Medicare HMO | Admitting: Orthopaedic Surgery

## 2016-06-03 ENCOUNTER — Encounter (INDEPENDENT_AMBULATORY_CARE_PROVIDER_SITE_OTHER): Payer: Self-pay | Admitting: Orthopaedic Surgery

## 2016-06-03 DIAGNOSIS — S91101D Unspecified open wound of right great toe without damage to nail, subsequent encounter: Secondary | ICD-10-CM

## 2016-06-03 NOTE — Progress Notes (Signed)
Patient is 2 months status post partial great toe amputation for a traumatic wound from a lawnmower injury. He is doing well. He denies any issues. He is ambulating well. He has no problems with shoe wear. At this point everything is healed up nicely. I don't see any problems. I'll see him back as needed.

## 2016-06-29 DIAGNOSIS — Z0000 Encounter for general adult medical examination without abnormal findings: Secondary | ICD-10-CM | POA: Diagnosis not present

## 2016-06-29 DIAGNOSIS — I10 Essential (primary) hypertension: Secondary | ICD-10-CM | POA: Diagnosis not present

## 2016-06-29 DIAGNOSIS — E669 Obesity, unspecified: Secondary | ICD-10-CM | POA: Diagnosis not present

## 2016-06-29 DIAGNOSIS — Z79899 Other long term (current) drug therapy: Secondary | ICD-10-CM | POA: Diagnosis not present

## 2016-06-29 DIAGNOSIS — K08109 Complete loss of teeth, unspecified cause, unspecified class: Secondary | ICD-10-CM | POA: Diagnosis not present

## 2016-06-29 DIAGNOSIS — Z6836 Body mass index (BMI) 36.0-36.9, adult: Secondary | ICD-10-CM | POA: Diagnosis not present

## 2016-06-29 DIAGNOSIS — Z7984 Long term (current) use of oral hypoglycemic drugs: Secondary | ICD-10-CM | POA: Diagnosis not present

## 2016-06-29 DIAGNOSIS — H9193 Unspecified hearing loss, bilateral: Secondary | ICD-10-CM | POA: Diagnosis not present

## 2016-06-29 DIAGNOSIS — E119 Type 2 diabetes mellitus without complications: Secondary | ICD-10-CM | POA: Diagnosis not present

## 2016-06-29 DIAGNOSIS — Z7982 Long term (current) use of aspirin: Secondary | ICD-10-CM | POA: Diagnosis not present

## 2016-07-11 NOTE — Addendum Note (Signed)
Addendum  created 07/11/16 1136 by Oleta Mouse, MD   Sign clinical note

## 2016-07-13 DIAGNOSIS — M4722 Other spondylosis with radiculopathy, cervical region: Secondary | ICD-10-CM | POA: Diagnosis not present

## 2016-07-18 DIAGNOSIS — E119 Type 2 diabetes mellitus without complications: Secondary | ICD-10-CM | POA: Diagnosis not present

## 2016-07-18 DIAGNOSIS — Z961 Presence of intraocular lens: Secondary | ICD-10-CM | POA: Diagnosis not present

## 2016-07-18 DIAGNOSIS — H40013 Open angle with borderline findings, low risk, bilateral: Secondary | ICD-10-CM | POA: Diagnosis not present

## 2016-07-18 DIAGNOSIS — H35363 Drusen (degenerative) of macula, bilateral: Secondary | ICD-10-CM | POA: Diagnosis not present

## 2016-07-18 DIAGNOSIS — H2512 Age-related nuclear cataract, left eye: Secondary | ICD-10-CM | POA: Diagnosis not present

## 2016-07-18 LAB — HM DIABETES EYE EXAM

## 2016-07-19 ENCOUNTER — Encounter: Payer: Self-pay | Admitting: Family Medicine

## 2016-07-31 DIAGNOSIS — M4802 Spinal stenosis, cervical region: Secondary | ICD-10-CM | POA: Diagnosis not present

## 2016-07-31 DIAGNOSIS — M50222 Other cervical disc displacement at C5-C6 level: Secondary | ICD-10-CM | POA: Diagnosis not present

## 2016-08-08 ENCOUNTER — Ambulatory Visit (INDEPENDENT_AMBULATORY_CARE_PROVIDER_SITE_OTHER): Payer: Medicare HMO | Admitting: Family Medicine

## 2016-08-08 ENCOUNTER — Encounter: Payer: Self-pay | Admitting: Family Medicine

## 2016-08-08 ENCOUNTER — Other Ambulatory Visit: Payer: Self-pay

## 2016-08-08 VITALS — BP 124/70 | HR 64 | Ht 68.0 in | Wt 228.0 lb

## 2016-08-08 DIAGNOSIS — Z8546 Personal history of malignant neoplasm of prostate: Secondary | ICD-10-CM

## 2016-08-08 DIAGNOSIS — E119 Type 2 diabetes mellitus without complications: Secondary | ICD-10-CM

## 2016-08-08 DIAGNOSIS — E1169 Type 2 diabetes mellitus with other specified complication: Secondary | ICD-10-CM

## 2016-08-08 DIAGNOSIS — I6522 Occlusion and stenosis of left carotid artery: Secondary | ICD-10-CM | POA: Diagnosis not present

## 2016-08-08 DIAGNOSIS — E785 Hyperlipidemia, unspecified: Secondary | ICD-10-CM | POA: Diagnosis not present

## 2016-08-08 DIAGNOSIS — Z8709 Personal history of other diseases of the respiratory system: Secondary | ICD-10-CM

## 2016-08-08 DIAGNOSIS — I152 Hypertension secondary to endocrine disorders: Secondary | ICD-10-CM

## 2016-08-08 DIAGNOSIS — Z1211 Encounter for screening for malignant neoplasm of colon: Secondary | ICD-10-CM | POA: Diagnosis not present

## 2016-08-08 DIAGNOSIS — G5601 Carpal tunnel syndrome, right upper limb: Secondary | ICD-10-CM

## 2016-08-08 DIAGNOSIS — J309 Allergic rhinitis, unspecified: Secondary | ICD-10-CM | POA: Diagnosis not present

## 2016-08-08 DIAGNOSIS — E1136 Type 2 diabetes mellitus with diabetic cataract: Secondary | ICD-10-CM

## 2016-08-08 DIAGNOSIS — E1159 Type 2 diabetes mellitus with other circulatory complications: Secondary | ICD-10-CM

## 2016-08-08 DIAGNOSIS — I10 Essential (primary) hypertension: Secondary | ICD-10-CM | POA: Diagnosis not present

## 2016-08-08 LAB — COMPREHENSIVE METABOLIC PANEL
ALBUMIN: 4.1 g/dL (ref 3.6–5.1)
ALK PHOS: 45 U/L (ref 40–115)
ALT: 18 U/L (ref 9–46)
AST: 20 U/L (ref 10–35)
BILIRUBIN TOTAL: 0.9 mg/dL (ref 0.2–1.2)
BUN: 19 mg/dL (ref 7–25)
CO2: 25 mmol/L (ref 20–31)
CREATININE: 1.25 mg/dL — AB (ref 0.70–1.18)
Calcium: 9.5 mg/dL (ref 8.6–10.3)
Chloride: 101 mmol/L (ref 98–110)
Glucose, Bld: 101 mg/dL — ABNORMAL HIGH (ref 65–99)
Potassium: 4.2 mmol/L (ref 3.5–5.3)
SODIUM: 137 mmol/L (ref 135–146)
TOTAL PROTEIN: 8.2 g/dL — AB (ref 6.1–8.1)

## 2016-08-08 LAB — CBC WITH DIFFERENTIAL/PLATELET
Basophils Absolute: 0 cells/uL (ref 0–200)
Basophils Relative: 0 %
EOS PCT: 7 %
Eosinophils Absolute: 357 cells/uL (ref 15–500)
HCT: 38.4 % — ABNORMAL LOW (ref 38.5–50.0)
HEMOGLOBIN: 12.8 g/dL — AB (ref 13.2–17.1)
LYMPHS ABS: 2040 {cells}/uL (ref 850–3900)
Lymphocytes Relative: 40 %
MCH: 30.9 pg (ref 27.0–33.0)
MCHC: 33.6 g/dL (ref 32.0–36.0)
MCV: 92.8 fL (ref 80.0–100.0)
MONO ABS: 306 {cells}/uL (ref 200–950)
MPV: 10.2 fL (ref 7.5–12.5)
Monocytes Relative: 6 %
NEUTROS ABS: 2397 {cells}/uL (ref 1500–7800)
Neutrophils Relative %: 47 %
Platelets: 268 10*3/uL (ref 140–400)
RBC: 4.14 MIL/uL — AB (ref 4.20–5.80)
RDW: 13.5 % (ref 11.0–15.0)
WBC: 5.1 10*3/uL (ref 4.0–10.5)

## 2016-08-08 LAB — POCT UA - MICROALBUMIN
Albumin/Creatinine Ratio, Urine, POC: 19.2
Creatinine, POC: 49.4 mg/dL
Microalbumin Ur, POC: 9.5 mg/L

## 2016-08-08 LAB — LIPID PANEL
CHOLESTEROL: 173 mg/dL (ref <200)
HDL: 84 mg/dL (ref >40)
LDL CALC: 71 mg/dL (ref <100)
TRIGLYCERIDES: 88 mg/dL (ref <150)
Total CHOL/HDL Ratio: 2.1 Ratio (ref <5.0)
VLDL: 18 mg/dL (ref <30)

## 2016-08-08 LAB — POCT GLYCOSYLATED HEMOGLOBIN (HGB A1C): HEMOGLOBIN A1C: 6.6

## 2016-08-08 MED ORDER — LANCETS MICRO THIN 33G MISC: 12 refills | Status: DC

## 2016-08-08 MED ORDER — GLUCOSE BLOOD VI STRP: ORAL_STRIP | 12 refills | Status: DC

## 2016-08-08 NOTE — Progress Notes (Signed)
Subjective:   HPI  Shaun Kennedy is a 76 y.o. male who presents for Chief Complaint  Patient presents with  . Medicare Wellness    med check plus    Medical care team includes: [name], MD here for primary care  Dr.Bradford Dr. Fredna Dow Dr. Erlinda Hong   Preventative care: to date Last ophthalmology visit: 07/2016 Dr.Weaver  Last dental visit: 12/2015 Last colonoscopy: 2007 Dr.Hung Last prostate exam:  2008 Last EKG:04/02/16 Last labs:07/22/15  Prior vaccinations:  TD or Tdap:12/06/07 Influenza:12/02/15 Pneumococcal:23: 06/03/10 13: 5/818/16 Shingles/Zostavax: 02/07/14 prescription for Shingrix written   Advanced directive: Yes. Asked to bring a copy for our records.   Concerns: His allergies are under good control. He has seen Dr. Fredna Dow for his carpal tunnel. Apparently is having difficulty with neck pain and then referred anteriorly to another orthopedic surgeon and is apparently has had an MRI. I did review the MRI with him. He is yet to hear from that surgeon is to appropriate follow-up. He does have a cataract and has had one done. The other one will be done at a later date as it is apparently not ready. There is also reviewed his history of carotid artery stenosis but there is no recent data on this in the record. He has no difficulty with his asthma and is more of a past history. He is now over a decade past prostate cancer in the use of seeds. The last colonoscopy was in 2007. He continues to monitor his blood sugars and presently is on metformin and doing quite nicely on that. He continues on simvastatin as well. He has had evaluation for AAA. He continues on meter better to help with his bladder function and this is doing quite nicely.  Reviewed their medical, surgical, family, social, medication, and allergy history and updated chart as appropriate.  Past Medical History:  Diagnosis Date  . Allergic rhinitis   . Asthma   . Carotid stenosis   . Diabetes mellitus ([place])    . Diverticulosis   . Dyslipidemia   . ED (erectile dysfunction)   . Hemorrhoids   . HTN (hypertension)   . LVH (left ventricular hypertrophy)    on EKG  . Prostate cancer ([place])   . Smoker    former    Past Surgical History:  Procedure Laterality Date  . COLONOSCOPY  2007   Dr. Benson [place]  . I&D EXTREMITY Right 04/02/2016   Procedure: IRRIGATION AND DEBRIDEMENT GREAT TOE;  Surgeon: Leandrew Koyanagi, MD;  Location: [place];  Service: Orthopedics;  Laterality: Right;    Social History   Social History  . Marital status: Married    Spouse name: N/A  . Number of children: N/A  . Years of education: N/A   Occupational History  . Not on file.   Social History Main Topics  . Smoking status: Former Smoker    Quit date: 10/09/1987  . Smokeless tobacco: Never Used  . Alcohol use No  . Drug use: No  . Sexual activity: Yes   Other Topics Concern  . Not on file   Social History Narrative   Married, no pets    Family History  Problem Relation Age of Onset  . Heart failure Mother   . Stroke Father      Current Outpatient Prescriptions:  .  aspirin EC 81 MG tablet, Take 81 mg by mouth daily., Disp: , Rfl:  .  cholecalciferol (VITAMIN D) 1000 units tablet, Take 1,000 Units by mouth  daily., Disp: , Rfl:  .  lisinopril-hydrochlorothiazide (ZESTORETIC) 20-12.5 MG tablet, Take 1 tablet by mouth daily., Disp: 90 tablet, Rfl: 3 .  metFORMIN (GLUCOPHAGE-XR) 500 MG 24 hr tablet, TAKE 1 TABLET ONE TIME DAILY WITH BREAKFAST, Disp: 90 tablet, Rfl: 1 .  Multiple Vitamin (MULTIVITAMIN WITH MINERALS) TABS tablet, Take 1 tablet by mouth daily., Disp: , Rfl:  .  simvastatin (ZOCOR) 40 MG tablet, Take 1 tablet (40 mg total) by mouth daily., Disp: 90 tablet, Rfl: 3 .  mirabegron ER (MYRBETRIQ) 25 MG TB24 tablet, Take 1 tablet (25 mg total) by mouth daily. (Patient not taking: Reported on 08/08/2016), Disp: 90 tablet, Rfl: 3 .  oxybutynin (DITROPAN-XL) 5 MG 24 hr tablet, Take 1 tablet (5 mg total) by mouth  at bedtime., Disp: 30 tablet, Rfl: 1  No Known Allergies     Review of Systems Negative except as above    Objective:   General appearance: alert, no distress, WD/WN, African American male Skin: Normal HEENT: normocephalic, conjunctiva/corneas normal, sclerae anicteric, PERRLA, EOMi, nares patent, no discharge or erythema, pharynx normal Oral cavity: MMM, tongue normal, teeth normal Neck: supple, no lymphadenopathy, no thyromegaly, no masses, normal ROM, no bruits Chest: non tender, normal shape and expansion Heart: RRR, normal S1, S2, no murmurs Lungs: CTA bilaterally, no wheezes, rhonchi, or rales Abdomen: +bs, soft, non tender, non distended, no masses, no hepatomegaly, no splenomegaly, no bruits  Musculoskeletal: upper extremities non tender, no obvious deformity, normal ROM throughout, lower extremities non tender, no obvious deformity, normal ROM throughout Extremities: no edema, no cyanosis, no clubbing Pulses: 2+ symmetric, upper and lower extremities, normal cap refill Neurological: alert, oriented x 3, CN2-12 intact, strength normal upper extremities and lower extremities, sensation normal throughout, DTRs 2+ throughout, no cerebellar signs, gait normal Psychiatric: normal affect, behavior normal, pleasant  A1c is 6.6  Assessment and Plan :    Type 2 diabetes mellitus without complication, without long-term current use of insulin (HCC) - Plan: POCT UA - Microalbumin, HgB A1c, CBC with Differential/Platelet, Comprehensive metabolic panel, Lipid panel  Allergic rhinitis, mild  Carpal tunnel syndrome of right wrist  Cataract associated with type 2 diabetes mellitus (HCC)  Stenosis of left carotid artery - Plan: US Carotid Duplex Bilateral  History of asthma  History of prostate cancer  Hyperlipidemia associated with type 2 diabetes mellitus ([place]) - Plan: Lipid panel  Hypertension associated with diabetes ([place]) - Plan: CBC with Differential/Platelet, Comprehensive  metabolic panel  Screening for colon cancer - Plan: Cologuard In general he is doing quite well with his diabetes. Continue to take excellent care of himself staying on his present metformin/lisinopril/HCTZ. He'll also continue on his simvastatin and his meter battery although it seems to be working quite nicely. Discussed the trouble that he is having with his neck and referral. Discussed the fact that there is really no good reason to go outside of [facility] for his care and recommend he call me before he gets referred to anyplace outside the city as at this point I think it is unnecessary and will interfere with coordination of his care.  Physical exam - discussed and counseled on healthy lifestyle, diet, exercise, preventative care, vaccinations, sick and well care, proper use of emergency dept and after hours care, and addressed their concerns.

## 2016-08-15 DIAGNOSIS — Z1212 Encounter for screening for malignant neoplasm of rectum: Secondary | ICD-10-CM | POA: Diagnosis not present

## 2016-08-15 DIAGNOSIS — Z1211 Encounter for screening for malignant neoplasm of colon: Secondary | ICD-10-CM | POA: Diagnosis not present

## 2016-08-15 LAB — COLOGUARD: Cologuard: NEGATIVE

## 2016-08-18 ENCOUNTER — Encounter: Payer: Self-pay | Admitting: Family Medicine

## 2016-08-22 ENCOUNTER — Encounter: Payer: Self-pay | Admitting: Family Medicine

## 2016-08-22 ENCOUNTER — Ambulatory Visit (INDEPENDENT_AMBULATORY_CARE_PROVIDER_SITE_OTHER): Payer: Medicare HMO | Admitting: Family Medicine

## 2016-08-22 VITALS — BP 130/70 | HR 50 | Wt 229.0 lb

## 2016-08-22 DIAGNOSIS — G5601 Carpal tunnel syndrome, right upper limb: Secondary | ICD-10-CM | POA: Diagnosis not present

## 2016-08-22 NOTE — Progress Notes (Signed)
   Subjective:    Patient ID: Shaun Kennedy, male    DOB: [birth year], 76 y.o.   MRN: [id number]  HPI He is here for consult concerning continued difficulty with right hand pain. Review of the record indicates he did have electrodiagnostic studies which did show bilateral CTS. He was then sent to hand surgery. There is a question of cervical myelopathy and he was sent to an orthopedic surgeon for further evaluation. The MRI did show some changes but probably not consistent with his symptoms. Apparently there was some confusion as to the fact that he had 2 different orthopedic surgeons each seeing him for different issues. I explained that it's best to stick with 1 and prefer that it eventually be through [facility] is supposed to [facility] for better continuity of care.   Review of Systems     Objective:   Physical Exam Alert and in no distress otherwise not examined       Assessment & Plan:  Carpal tunnel syndrome of right wrist I will refer him back to Dr. Fredna Dow since I think his symptoms are most consistent with CTS.

## 2016-08-24 DIAGNOSIS — G5621 Lesion of ulnar nerve, right upper limb: Secondary | ICD-10-CM | POA: Diagnosis not present

## 2016-10-20 ENCOUNTER — Other Ambulatory Visit: Payer: Self-pay | Admitting: Family Medicine

## 2016-11-08 DIAGNOSIS — R69 Illness, unspecified: Secondary | ICD-10-CM | POA: Diagnosis not present

## 2016-12-12 ENCOUNTER — Telehealth: Payer: Self-pay

## 2016-12-12 ENCOUNTER — Encounter: Payer: Self-pay | Admitting: Family Medicine

## 2016-12-12 ENCOUNTER — Ambulatory Visit (INDEPENDENT_AMBULATORY_CARE_PROVIDER_SITE_OTHER): Payer: Medicare HMO | Admitting: Family Medicine

## 2016-12-12 VITALS — BP 130/80 | HR 63 | Wt 225.2 lb

## 2016-12-12 DIAGNOSIS — N3281 Overactive bladder: Secondary | ICD-10-CM | POA: Diagnosis not present

## 2016-12-12 DIAGNOSIS — E1169 Type 2 diabetes mellitus with other specified complication: Secondary | ICD-10-CM | POA: Diagnosis not present

## 2016-12-12 DIAGNOSIS — Z23 Encounter for immunization: Secondary | ICD-10-CM | POA: Diagnosis not present

## 2016-12-12 DIAGNOSIS — E119 Type 2 diabetes mellitus without complications: Secondary | ICD-10-CM | POA: Diagnosis not present

## 2016-12-12 DIAGNOSIS — E1159 Type 2 diabetes mellitus with other circulatory complications: Secondary | ICD-10-CM | POA: Diagnosis not present

## 2016-12-12 DIAGNOSIS — I10 Essential (primary) hypertension: Secondary | ICD-10-CM | POA: Diagnosis not present

## 2016-12-12 DIAGNOSIS — I152 Hypertension secondary to endocrine disorders: Secondary | ICD-10-CM

## 2016-12-12 DIAGNOSIS — E785 Hyperlipidemia, unspecified: Secondary | ICD-10-CM | POA: Diagnosis not present

## 2016-12-12 DIAGNOSIS — G5601 Carpal tunnel syndrome, right upper limb: Secondary | ICD-10-CM

## 2016-12-12 LAB — POCT GLYCOSYLATED HEMOGLOBIN (HGB A1C): Hemoglobin A1C: 6.6

## 2016-12-12 MED ORDER — PREGABALIN 25 MG PO CAPS: 25.0000 mg | ORAL_CAPSULE | Freq: Two times a day (BID) | ORAL | 1 refills | Status: DC

## 2016-12-12 NOTE — Telephone Encounter (Signed)
Called in Lyrica per [place] order. Victorino December

## 2016-12-12 NOTE — Progress Notes (Signed)
  Subjective:    Patient ID: Shaun Kennedy, male    DOB: 12-11-1940, 76 y.o.   MRN: [id number]  Shaun Kennedy is a 76 y.o. male who presents for follow-up of Type 2 diabetes mellitus.  Patient is checking home blood sugars.   Home blood sugar records: BGs range between 106 and 126 How often is blood sugars being checked: daily Current symptoms/problems include tingling in left hand and have been stable. Daily foot checks: yes   Any foot concerns: yes  Last eye exam: March 2018 [place] Exercise: Home exercise routine includes golf 3x week. [place] Kitchen [facility]. He continues on lisinopril and is having no swelling or cough. He also is taking metformin and simvastatin without difficulty. There is also taking Myrbetriq for his bladder symptoms. He continues to have symptoms of tingling sensation in the fourth and fifth fingers. He has had previous EMG studies as well as orthopedic referral and does have a MRI of his neck. These were not diagnostic.  The following portions of the patient's history were reviewed and updated as appropriate: allergies, current medications, past medical history, past social history and problem list.  ROS as in subjective above.     Objective:    Physical Exam Alert and in no distress otherwise not examined.    Lab Review Diabetic Labs Latest Ref Rng & Units 08/08/2016 04/05/2016 04/02/2016 12/02/2015 07/22/2015  HbA1c - 6.6 6.5 - 6.9 6.5  Microalbumin mg/L 9.5 - - - -  Micro/Creat Ratio - 19.2 - - - -  Chol <200 mg/dL 173 - - - 147  HDL >40 mg/dL 84 - - - 83  Calc LDL <100 mg/dL 71 - - - 49  Triglycerides <150 mg/dL 88 - - - 75  Creatinine 0.70 - 1.18 mg/dL 1.25(H) - 1.20 - 1.14   BP/Weight 08/22/2016 08/08/2016 05/23/2016 05/03/2016 7/54/4920  Systolic BP 100 712 197 588 325  Diastolic BP 70 70 70 80 84  Wt. (Lbs) 229 228 224 227 231  BMI 34.82 34.67 34.06 34.52 35.12   Foot/eye exam completion dates Latest Ref Rng & Units 08/08/2016 07/18/2016    Eye Exam No Retinopathy - No Retinopathy  Foot Form Completion - Done -  Makar  reports that he quit smoking about 29 years ago. he has never used smokeless tobacco. He reports that he does not drink alcohol or use drugs.  A1c is 6.6     Assessment & Plan:    Need for influenza vaccination - Plan: Flu vaccine HIGH DOSE PF (Fluzone High dose)  Hypertension associated with diabetes ([place])  Hyperlipidemia associated with type 2 diabetes mellitus ([place])  Type 2 diabetes mellitus without complication, without long-term current use of insulin ([place]) - Plan: HgB A1c  Carpal tunnel syndrome of right wrist   1. Rx changes: Lyrica. I discussed the fact that his symptoms don't correspond to the EMG or MRI studies. He would like to try medication for this. I will start him on Lyrica and increase based on his symptoms. 2. Education: Reviewed 'ABCs' of diabetes management (respective goals in parentheses):  A1C (<7), blood pressure (<130/80), and cholesterol (LDL <100). 3. Compliance at present is estimated to be good. Efforts to improve compliance (if necessary) will be directed at increased exercise. 4. Follow up: 4 months

## 2016-12-23 DIAGNOSIS — R69 Illness, unspecified: Secondary | ICD-10-CM | POA: Diagnosis not present

## 2017-01-09 ENCOUNTER — Other Ambulatory Visit: Payer: Self-pay

## 2017-01-09 MED ORDER — GLUCOSE BLOOD VI STRP: ORAL_STRIP | 12 refills | Status: DC

## 2017-01-10 ENCOUNTER — Other Ambulatory Visit: Payer: Self-pay

## 2017-01-10 MED ORDER — GLUCOSE BLOOD VI STRP: ORAL_STRIP | 0 refills | Status: DC

## 2017-01-13 ENCOUNTER — Other Ambulatory Visit: Payer: Self-pay

## 2017-01-13 MED ORDER — GLUCOSE BLOOD VI STRP: ORAL_STRIP | 0 refills | Status: DC

## 2017-01-18 ENCOUNTER — Other Ambulatory Visit: Payer: Self-pay

## 2017-01-18 DIAGNOSIS — R69 Illness, unspecified: Secondary | ICD-10-CM | POA: Diagnosis not present

## 2017-01-18 MED ORDER — GLUCOSE BLOOD VI STRP: ORAL_STRIP | 5 refills | Status: DC

## 2017-01-18 MED ORDER — ONETOUCH VERIO IQ SYSTEM W/DEVICE KIT: PACK | 0 refills | Status: DC

## 2017-01-19 ENCOUNTER — Telehealth: Payer: Self-pay | Admitting: *Deleted

## 2017-01-19 ENCOUNTER — Telehealth: Payer: Self-pay | Admitting: Family Medicine

## 2017-01-19 ENCOUNTER — Other Ambulatory Visit: Payer: Self-pay | Admitting: Family Medicine

## 2017-01-19 MED ORDER — PREGABALIN 50 MG PO CAPS
50.0000 mg | ORAL_CAPSULE | Freq: Three times a day (TID) | ORAL | 3 refills | Status: DC
Start: 2017-01-19 — End: 2017-01-19

## 2017-01-19 MED ORDER — PREGABALIN 50 MG PO CAPS: 50.0000 mg | ORAL_CAPSULE | Freq: Every day | ORAL | 3 refills | Status: DC

## 2017-01-19 MED ORDER — PREGABALIN 50 MG PO CAPS: 50.0000 mg | ORAL_CAPSULE | Freq: Every day | ORAL | 0 refills | Status: DC

## 2017-01-19 NOTE — Telephone Encounter (Signed)
Pt called and states Lyrica 25 mg is working well and wants to know if you can increase it some.  [facility] to [facility] [address]

## 2017-01-19 NOTE — Telephone Encounter (Signed)
Called [facility] to change the rx to once daily and apparently he hasn't used them since 2014-I canceled the rx and took this pharmacy out of his chart-he needs it sent to [place]. I told him you would send it and I also told him that you [facility] one [facility].

## 2017-01-19 NOTE — Telephone Encounter (Signed)
I called patient, he does not have any more tablets of the 25mg  left-ha had asked for the rx to [facility] [address] for that reason. Do you want me to call him in a 30 day supply to that local pharmacy? He also states that it will take about 9 days to get him from the mail order.

## 2017-01-19 NOTE — Telephone Encounter (Signed)
Tell him that [facility] and double his present dosing until the new prescription comes in

## 2017-02-16 ENCOUNTER — Other Ambulatory Visit: Payer: Self-pay | Admitting: Family Medicine

## 2017-02-21 ENCOUNTER — Other Ambulatory Visit: Payer: Self-pay | Admitting: Family Medicine

## 2017-03-03 DIAGNOSIS — R69 Illness, unspecified: Secondary | ICD-10-CM | POA: Diagnosis not present

## 2017-03-09 DIAGNOSIS — R69 Illness, unspecified: Secondary | ICD-10-CM | POA: Diagnosis not present

## 2017-04-10 ENCOUNTER — Ambulatory Visit: Payer: Medicare HMO | Admitting: Family Medicine

## 2017-04-18 DIAGNOSIS — R69 Illness, unspecified: Secondary | ICD-10-CM | POA: Diagnosis not present

## 2017-04-21 ENCOUNTER — Other Ambulatory Visit: Payer: Self-pay | Admitting: Family Medicine

## 2017-04-21 DIAGNOSIS — E119 Type 2 diabetes mellitus without complications: Secondary | ICD-10-CM

## 2017-04-21 DIAGNOSIS — E1159 Type 2 diabetes mellitus with other circulatory complications: Secondary | ICD-10-CM

## 2017-04-21 DIAGNOSIS — I152 Hypertension secondary to endocrine disorders: Secondary | ICD-10-CM

## 2017-04-21 DIAGNOSIS — I10 Essential (primary) hypertension: Principal | ICD-10-CM

## 2017-04-24 ENCOUNTER — Encounter: Payer: Self-pay | Admitting: Family Medicine

## 2017-04-24 ENCOUNTER — Ambulatory Visit (INDEPENDENT_AMBULATORY_CARE_PROVIDER_SITE_OTHER): Payer: Medicare HMO | Admitting: Family Medicine

## 2017-04-24 VITALS — BP 114/72 | HR 76 | Wt 226.8 lb

## 2017-04-24 DIAGNOSIS — I152 Hypertension secondary to endocrine disorders: Secondary | ICD-10-CM

## 2017-04-24 DIAGNOSIS — N3281 Overactive bladder: Secondary | ICD-10-CM

## 2017-04-24 DIAGNOSIS — E785 Hyperlipidemia, unspecified: Secondary | ICD-10-CM

## 2017-04-24 DIAGNOSIS — E1159 Type 2 diabetes mellitus with other circulatory complications: Secondary | ICD-10-CM | POA: Diagnosis not present

## 2017-04-24 DIAGNOSIS — E1169 Type 2 diabetes mellitus with other specified complication: Secondary | ICD-10-CM | POA: Diagnosis not present

## 2017-04-24 DIAGNOSIS — I10 Essential (primary) hypertension: Secondary | ICD-10-CM

## 2017-04-24 DIAGNOSIS — E119 Type 2 diabetes mellitus without complications: Secondary | ICD-10-CM

## 2017-04-24 LAB — POCT GLYCOSYLATED HEMOGLOBIN (HGB A1C): Hemoglobin A1C: 6.2

## 2017-04-24 NOTE — Progress Notes (Signed)
  Subjective:    Patient ID: Shaun Kennedy, male    DOB: October 17, 1940, 77 y.o.   MRN: [id number]  Shaun Kennedy is a 77 y.o. male who presents for follow-up of Type 2 diabetes mellitus.  Patient is checking home blood sugars.   Home blood sugar records: meter records How often is blood sugars being checked: twice a day Current symptoms/problems include none and have been unchanged. Daily foot checks: yes   Any foot concerns: no Last eye exam: July 2018 Exercise: walking and golf He stopped taking Lyrica stating it caused bilateral knee pain.  His right arm is not giving him much trouble so he is not interested in trying a different medication.  He is no longer taking Myrbetriq and states that his bladder function is okay.  He continues on metformin as well as lisinopril/HCTZ.  He is also taking Zocor.  He is also on multiple OTC medications.  He apparently did get 1 Shingrix shot.  The following portions of the patient's history were reviewed and updated as appropriate: allergies, current medications, past medical history, past social history and problem list.  ROS as in subjective above.     Objective:    Physical Exam Alert and in no distress otherwise not examined.   Lab Review Diabetic Labs Latest Ref Rng & Units 12/12/2016 08/08/2016 04/05/2016 04/02/2016 12/02/2015  HbA1c - 6.6 6.6 6.5 - 6.9  Microalbumin mg/L - 9.5 - - -  Micro/Creat Ratio - - 19.2 - - -  Chol <200 mg/dL - 173 - - -  HDL >40 mg/dL - 84 - - -  Calc LDL <100 mg/dL - 71 - - -  Triglycerides <150 mg/dL - 88 - - -  Creatinine 0.70 - 1.18 mg/dL - 1.25(H) - 1.20 -   BP/Weight 12/12/2016 08/22/2016 08/08/2016 05/23/2016 6/96/2952  Systolic BP 841 324 401 027 253  Diastolic BP 80 70 70 70 80  Wt. (Lbs) 225.2 229 228 224 227  BMI 34.24 34.82 34.67 34.06 34.52   Foot/eye exam completion dates Latest Ref Rng & Units 08/08/2016 07/18/2016  Eye Exam No Retinopathy - No Retinopathy  Foot Form Completion - Done -  A1c is  6.2  Shaun Kennedy  reports that he quit smoking about 29 years ago. he has never used smokeless tobacco. He reports that he does not drink alcohol or use drugs.     Assessment & Plan:    Type 2 diabetes mellitus without complication, without long-term current use of insulin (HCC) - Plan: POCT glycosylated hemoglobin (Hb A1C)  Hypertension associated with diabetes ([place])  Hyperlipidemia associated with type 2 diabetes mellitus (HCC)  OAB (overactive bladder)   1. Rx changes: none 2. Education: Reviewed 'ABCs' of diabetes management (respective goals in parentheses):  A1C (<7), blood pressure (<130/80), and cholesterol (LDL <100). 3. Compliance at present is estimated to be good. Efforts to improve compliance (if necessary) will be directed at increased exercise. 4. Follow up: 4 months Overall he is doing well and would like to stretch this out to 6 months.

## 2017-05-10 DIAGNOSIS — Z6834 Body mass index (BMI) 34.0-34.9, adult: Secondary | ICD-10-CM | POA: Diagnosis not present

## 2017-05-10 DIAGNOSIS — E669 Obesity, unspecified: Secondary | ICD-10-CM | POA: Diagnosis not present

## 2017-05-10 DIAGNOSIS — Z7984 Long term (current) use of oral hypoglycemic drugs: Secondary | ICD-10-CM | POA: Diagnosis not present

## 2017-05-10 DIAGNOSIS — Z7982 Long term (current) use of aspirin: Secondary | ICD-10-CM | POA: Diagnosis not present

## 2017-05-10 DIAGNOSIS — E119 Type 2 diabetes mellitus without complications: Secondary | ICD-10-CM | POA: Diagnosis not present

## 2017-05-10 DIAGNOSIS — E785 Hyperlipidemia, unspecified: Secondary | ICD-10-CM | POA: Diagnosis not present

## 2017-05-10 DIAGNOSIS — I10 Essential (primary) hypertension: Secondary | ICD-10-CM | POA: Diagnosis not present

## 2017-05-10 DIAGNOSIS — Z8546 Personal history of malignant neoplasm of prostate: Secondary | ICD-10-CM | POA: Diagnosis not present

## 2017-05-10 LAB — HEMOGLOBIN A1C: HEMOGLOBIN A1C: 6.2

## 2017-07-19 DIAGNOSIS — H35363 Drusen (degenerative) of macula, bilateral: Secondary | ICD-10-CM | POA: Diagnosis not present

## 2017-07-19 DIAGNOSIS — E119 Type 2 diabetes mellitus without complications: Secondary | ICD-10-CM | POA: Diagnosis not present

## 2017-07-19 DIAGNOSIS — H40013 Open angle with borderline findings, low risk, bilateral: Secondary | ICD-10-CM | POA: Diagnosis not present

## 2017-07-19 DIAGNOSIS — H2512 Age-related nuclear cataract, left eye: Secondary | ICD-10-CM | POA: Diagnosis not present

## 2017-07-19 LAB — HM DIABETES EYE EXAM

## 2017-07-25 DIAGNOSIS — R69 Illness, unspecified: Secondary | ICD-10-CM | POA: Diagnosis not present

## 2017-09-04 ENCOUNTER — Telehealth: Payer: Self-pay | Admitting: Family Medicine

## 2017-09-04 ENCOUNTER — Other Ambulatory Visit: Payer: Self-pay | Admitting: Family Medicine

## 2017-09-04 ENCOUNTER — Other Ambulatory Visit: Payer: Self-pay

## 2017-09-04 DIAGNOSIS — R69 Illness, unspecified: Secondary | ICD-10-CM | POA: Diagnosis not present

## 2017-09-04 MED ORDER — LANCETS MICRO THIN 33G MISC: 12 refills | Status: DC

## 2017-09-04 MED ORDER — GLUCOSE BLOOD VI STRP: ORAL_STRIP | 5 refills | Status: DC

## 2017-09-04 NOTE — Telephone Encounter (Signed)
Done and pt called . KH 

## 2017-09-04 NOTE — Telephone Encounter (Signed)
Pt need lancets and strips for his new [facility] [address].

## 2017-09-05 DIAGNOSIS — R69 Illness, unspecified: Secondary | ICD-10-CM | POA: Diagnosis not present

## 2017-10-11 ENCOUNTER — Encounter: Payer: Self-pay | Admitting: Family Medicine

## 2017-10-11 ENCOUNTER — Ambulatory Visit (INDEPENDENT_AMBULATORY_CARE_PROVIDER_SITE_OTHER): Payer: Medicare HMO | Admitting: Family Medicine

## 2017-10-11 VITALS — BP 198/98 | HR 53 | Temp 97.7°F | Ht 68.0 in | Wt 225.8 lb

## 2017-10-11 DIAGNOSIS — J309 Allergic rhinitis, unspecified: Secondary | ICD-10-CM | POA: Diagnosis not present

## 2017-10-11 DIAGNOSIS — E1159 Type 2 diabetes mellitus with other circulatory complications: Secondary | ICD-10-CM | POA: Diagnosis not present

## 2017-10-11 DIAGNOSIS — I10 Essential (primary) hypertension: Secondary | ICD-10-CM

## 2017-10-11 DIAGNOSIS — I152 Hypertension secondary to endocrine disorders: Secondary | ICD-10-CM

## 2017-10-11 DIAGNOSIS — N3281 Overactive bladder: Secondary | ICD-10-CM | POA: Diagnosis not present

## 2017-10-11 DIAGNOSIS — Z8709 Personal history of other diseases of the respiratory system: Secondary | ICD-10-CM | POA: Diagnosis not present

## 2017-10-11 DIAGNOSIS — Z8546 Personal history of malignant neoplasm of prostate: Secondary | ICD-10-CM

## 2017-10-11 DIAGNOSIS — Z0000 Encounter for general adult medical examination without abnormal findings: Secondary | ICD-10-CM

## 2017-10-11 DIAGNOSIS — N528 Other male erectile dysfunction: Secondary | ICD-10-CM

## 2017-10-11 DIAGNOSIS — E669 Obesity, unspecified: Secondary | ICD-10-CM

## 2017-10-11 DIAGNOSIS — E1136 Type 2 diabetes mellitus with diabetic cataract: Secondary | ICD-10-CM | POA: Diagnosis not present

## 2017-10-11 DIAGNOSIS — R0989 Other specified symptoms and signs involving the circulatory and respiratory systems: Secondary | ICD-10-CM

## 2017-10-11 DIAGNOSIS — E119 Type 2 diabetes mellitus without complications: Secondary | ICD-10-CM

## 2017-10-11 DIAGNOSIS — E1169 Type 2 diabetes mellitus with other specified complication: Secondary | ICD-10-CM

## 2017-10-11 DIAGNOSIS — I6522 Occlusion and stenosis of left carotid artery: Secondary | ICD-10-CM

## 2017-10-11 DIAGNOSIS — E1121 Type 2 diabetes mellitus with diabetic nephropathy: Secondary | ICD-10-CM

## 2017-10-11 DIAGNOSIS — B351 Tinea unguium: Secondary | ICD-10-CM

## 2017-10-11 DIAGNOSIS — E785 Hyperlipidemia, unspecified: Secondary | ICD-10-CM

## 2017-10-11 DIAGNOSIS — I700 Atherosclerosis of aorta: Secondary | ICD-10-CM

## 2017-10-11 DIAGNOSIS — Z87891 Personal history of nicotine dependence: Secondary | ICD-10-CM

## 2017-10-11 LAB — COMPREHENSIVE METABOLIC PANEL
A/G RATIO: 1.1 — AB (ref 1.2–2.2)
ALT: 19 IU/L (ref 0–44)
AST: 18 IU/L (ref 0–40)
Albumin: 4.2 g/dL (ref 3.5–4.8)
Alkaline Phosphatase: 53 IU/L (ref 39–117)
BUN/Creatinine Ratio: 9 — ABNORMAL LOW (ref 10–24)
BUN: 11 mg/dL (ref 8–27)
Bilirubin Total: 1.1 mg/dL (ref 0.0–1.2)
CALCIUM: 9.8 mg/dL (ref 8.6–10.2)
CO2: 25 mmol/L (ref 20–29)
CREATININE: 1.2 mg/dL (ref 0.76–1.27)
Chloride: 101 mmol/L (ref 96–106)
GFR calc Af Amer: 66 mL/min/{1.73_m2} (ref >59)
GFR, EST NON AFRICAN AMERICAN: 57 mL/min/{1.73_m2} — AB (ref >59)
Globulin, Total: 3.9 g/dL (ref 1.5–4.5)
Glucose: 129 mg/dL — ABNORMAL HIGH (ref 65–99)
POTASSIUM: 4.2 mmol/L (ref 3.5–5.2)
Sodium: 141 mmol/L (ref 134–144)
Total Protein: 8.1 g/dL (ref 6.0–8.5)

## 2017-10-11 LAB — CBC WITH DIFFERENTIAL/PLATELET
BASOS: 0 %
Basophils Absolute: 0 10*3/uL (ref 0.0–0.2)
EOS (ABSOLUTE): 0.4 10*3/uL (ref 0.0–0.4)
Eos: 5 %
HEMATOCRIT: 39.6 % (ref 37.5–51.0)
Hemoglobin: 13.3 g/dL (ref 13.0–17.7)
IMMATURE GRANS (ABS): 0 10*3/uL (ref 0.0–0.1)
IMMATURE GRANULOCYTES: 0 %
Lymphocytes Absolute: 2.1 10*3/uL (ref 0.7–3.1)
Lymphs: 31 %
MCH: 31.4 pg (ref 26.6–33.0)
MCHC: 33.6 g/dL (ref 31.5–35.7)
MCV: 93 fL (ref 79–97)
Monocytes Absolute: 0.7 10*3/uL (ref 0.1–0.9)
Monocytes: 10 %
Neutrophils Absolute: 3.6 10*3/uL (ref 1.4–7.0)
Neutrophils: 54 %
PLATELETS: 289 10*3/uL (ref 150–450)
RBC: 4.24 x10E6/uL (ref 4.14–5.80)
RDW: 13.6 % (ref 12.3–15.4)
WBC: 6.8 10*3/uL (ref 3.4–10.8)

## 2017-10-11 LAB — POCT UA - MICROALBUMIN
Albumin/Creatinine Ratio, Urine, POC: >262
Creatinine, POC: 114.5 mg/dL

## 2017-10-11 LAB — POCT GLYCOSYLATED HEMOGLOBIN (HGB A1C): Hemoglobin A1C: 6.5 % — AB (ref 4.0–5.6)

## 2017-10-11 LAB — LIPID PANEL
CHOL/HDL RATIO: 2 ratio (ref 0.0–5.0)
Cholesterol, Total: 173 mg/dL (ref 100–199)
HDL: 88 mg/dL (ref >39)
LDL CALC: 70 mg/dL (ref 0–99)
TRIGLYCERIDES: 75 mg/dL (ref 0–149)
VLDL CHOLESTEROL CAL: 15 mg/dL (ref 5–40)

## 2017-10-11 MED ORDER — SOLIFENACIN SUCCINATE 5 MG PO TABS: 5.0000 mg | ORAL_TABLET | Freq: Every day | ORAL | 1 refills | Status: DC

## 2017-10-11 NOTE — Progress Notes (Addendum)
Shaun Kennedy is a 77 y.o. male who presents for annual wellness visit,CPE and follow-up on chronic medical conditions.  Does have underlying diabetes and does check his blood sugars regularly.  His blood sugars fasting are usually in the low 100s.  He does check his feet regularly and would like to be referred to podiatry to help take care of the nails.  There is evidence of onychomycosis.  He has had cataract surgery and is scheduled for left cataract in the near future.  He does not smoke or drink.  He does exercise regularly with walking.  He does have a history of prostate cancer.  He does have difficulty with erectile dysfunction but is not overly concerned about that.  He is noting some urge incontinence and would like to be given medication for this.  Has a remote history of right great toe amputation.  He continues on simvastatin as well as lisinopril/HCTZ and is having no difficulties with that.  Is a remote history of asthma but none since childhood.  X-ray did show evidence of atherosclerosis.  His weight is been fairly stable.  He is a former smoker.  Did have a AAA evaluation and will need to repeat in several years.  There is also history of carotid stenosis however I do not see any recent studies on that.  Immunizations and Health Maintenance Immunization History  Administered Date(s) Administered  . Influenza, High Dose Seasonal PF 12/02/2015, 12/12/2016  . Pneumococcal Conjugate-13 06/25/2014  . Pneumococcal Polysaccharide-23 06/03/2010  . Tdap 12/06/2007  . Zoster 02/08/2008  . Zoster Recombinat (Shingrix) 04/26/2017   Health Maintenance Due  Topic Date Due  . FOOT EXAM  08/08/2017  . INFLUENZA VACCINE  09/07/2017    Last colonoscopy:over five years Last PSA: over two years  Dentist: 2018 Ophtho: 04/2017 Exercise: walking 3 x week , golf  Other doctors caring for patient include:  Advanced Directives: Yes.  Copy asked for. Does Patient Have a Medical Advance Directive?:  Yes Type of Advance Directive: Living will Does patient want to make changes to medical advance directive?: No - Patient declined  Depression screen:  See questionnaire below.     Depression screen [facility] 2/9 10/11/2017 08/08/2016 07/22/2015 10/29/2014 01/24/2014  Decreased Interest 0 0 0 0 0  Down, Depressed, Hopeless 0 0 0 0 0  PHQ - 2 Score 0 0 0 0 0    Fall Screen: See Questionaire below.   Fall Risk  10/11/2017 08/08/2016 07/22/2015 01/24/2014 07/06/2012  Falls in the past year? No No No No No    ADL screen:  See questionnaire below.  Functional Status Survey: Is the patient deaf or have difficulty hearing?: No Does the patient have difficulty seeing, even when wearing glasses/contacts?: No Does the patient have difficulty concentrating, remembering, or making decisions?: No Does the patient have difficulty walking or climbing stairs?: No Does the patient have difficulty dressing or bathing?: No Does the patient have difficulty doing errands alone such as visiting a doctor's office or shopping?: No   Review of Systems  Constitutional: -, -unexpected weight change, -anorexia, -fatigue Allergy: -sneezing, -itching, -congestion Dermatology: denies changing moles, rash, lumps ENT: -runny nose, -ear pain, -sore throat,  Cardiology:  -chest pain, -palpitations, -orthopnea, Respiratory: -cough, -shortness of breath, -dyspnea on exertion, -wheezing,  Gastroenterology: -abdominal pain, -nausea, -vomiting, -diarrhea, -constipation, -dysphagia Hematology: -bleeding or bruising problems Musculoskeletal: -arthralgias, -myalgias, -joint swelling, -back pain, - Ophthalmology: -vision changes,  Urology: -dysuria, -difficulty urinating,  -urinary frequency, -urgency, incontinence  Neurology: -, -numbness, , -memory loss, -falls, -dizziness    PHYSICAL EXAM:   General Appearance: Alert, cooperative, no distress, appears stated age Head: Normocephalic, without obvious abnormality,  atraumatic Eyes: PERRL, conjunctiva/corneas clear, EOM's intact, fundi benign Ears: Normal TM's and external ear canals Nose: Nares normal, mucosa normal, no drainage or sinus   tenderness Throat: Lips, mucosa, and tongue normal; teeth and gums normal Neck: Supple, no lymphadenopathy, thyroid:no enlargement/tenderness/nodules; no carotid bruit or JVD Lungs: Clear to auscultation bilaterally without wheezes, rales or ronchi; respirations unlabored Heart: Regular rate and rhythm, S1 and S2 normal, no murmur, rub or gallop Abdomen: Soft, non-tender, nondistended, normoactive bowel sounds, no masses, no hepatosplenomegaly Extremities: No clubbing, cyanosis or edema.  Tip of right great toe has been amputated.  Thickening of several of the toenails is noted. Pulses: 2+ and symmetric all extremities Skin: Skin color, texture, turgor normal, no rashes or lesions Lymph nodes: Cervical, supraclavicular, and axillary nodes normal Neurologic: CNII-XII intact, normal strength, sensation and gait; reflexes 2+ and symmetric throughout   Psych: Normal mood, affect, hygiene and grooming A1c is 6.5 AC ratio is greater than 262 ASSESSMENT/PLAN: Routine general medical examination at a health care facility - Plan: CBC with Differential/Platelet, Comprehensive metabolic panel, Lipid panel  Hyperlipidemia associated with type 2 diabetes mellitus ([place]) - Plan: Lipid panel  Onychomycosis of multiple toenails with type 2 diabetes mellitus ([place]) - Plan: Ambulatory referral to Podiatry  Hypertension associated with diabetes ([place]) - Plan: CANCELED: US RENAL ARTERY DUPLEX COMPLETE  Type 2 diabetes mellitus without complication, without long-term current use of insulin ([place]) - Plan: CBC with Differential/Platelet, Comprehensive metabolic panel, Lipid panel, POCT UA - Microalbumin, POCT glycosylated hemoglobin (Hb A1C), VAS [place] ABI WITH/WO TBI  Stenosis of left carotid artery - Plan: VAS US CAROTID, CANCELED: US  Carotid Duplex Bilateral  Other male erectile dysfunction  Allergic rhinitis, mild  History of asthma  Cataract associated with type 2 diabetes mellitus (HCC)  OAB (overactive bladder) - Plan: solifenacin (VESICARE) 5 MG tablet  History of prostate cancer  Former smoker, stopped smoking in distant past  Obesity (BMI 30.0-34.9) - Plan: CBC with Differential/Platelet, Comprehensive metabolic panel, Lipid panel  Atherosclerosis of aorta (HCC) - Seen on chest x-ray - Plan: VAS US RENAL ARTERY DUPLEX  Decreased pedal pulses - Plan: VAS [place] ABI WITH/WO TBI  Diabetic nephropathy associated with type 2 diabetes mellitus ([place]) His blood pressure is quite elevated and therefore a renal artery ultrasound will be ordered as well as blood work.  Discussed using Vesicare for his OAB.  Explained possible side effects with him.  He will keep me informed as to the benefit of that. He will continue on his present medication regimen. At the end of the encounter he mentioned difficulty with constipation.  I recommended fluids, bulk in the diet, exercise and the possible use of Metamucil.     recommended at least 30 minutes of aerobic activity at least 5 days/week; proper sunscreen use reviewed; healthy diet and alcohol recommendations (less than or equal to 2 drinks/day) reviewed; regular seatbelt use; changing batteries in smoke detectors. Immunization recommendations discussed.   Medicare Attestation I have personally reviewed: The patient's medical and social history Their use of alcohol, tobacco or illicit drugs Their current medications and supplements The patient's functional ability including ADLs,fall risks, home safety risks, cognitive, and hearing and visual impairment Diet and physical activities Evidence for depression or mood disorders  The patient's weight, height, and BMI have been recorded in  the chart.  I have made referrals, counseling, and provided education to the patient based  on review of the above and I have provided the patient with a written personalized care plan for preventive services.     Jill Alexanders, MD   10/11/2017

## 2017-10-12 ENCOUNTER — Ambulatory Visit (HOSPITAL_COMMUNITY)
Admission: RE | Admit: 2017-10-12 | Discharge: 2017-10-12 | Disposition: A | Discharge status: Home / Self Care | Payer: Medicare HMO | Source: Ambulatory Visit | Attending: Family | Admitting: Family

## 2017-10-12 ENCOUNTER — Ambulatory Visit (INDEPENDENT_AMBULATORY_CARE_PROVIDER_SITE_OTHER)
Admission: RE | Admit: 2017-10-12 | Discharge: 2017-10-12 | Disposition: A | Discharge status: Home / Self Care | Payer: Medicare HMO | Source: Ambulatory Visit | Attending: Family | Admitting: Family

## 2017-10-12 ENCOUNTER — Other Ambulatory Visit: Payer: Self-pay

## 2017-10-12 DIAGNOSIS — I6522 Occlusion and stenosis of left carotid artery: Secondary | ICD-10-CM | POA: Diagnosis not present

## 2017-10-12 DIAGNOSIS — Z87891 Personal history of nicotine dependence: Secondary | ICD-10-CM | POA: Diagnosis not present

## 2017-10-12 DIAGNOSIS — R0989 Other specified symptoms and signs involving the circulatory and respiratory systems: Secondary | ICD-10-CM

## 2017-10-12 DIAGNOSIS — I700 Atherosclerosis of aorta: Secondary | ICD-10-CM | POA: Diagnosis not present

## 2017-10-12 DIAGNOSIS — E1151 Type 2 diabetes mellitus with diabetic peripheral angiopathy without gangrene: Secondary | ICD-10-CM | POA: Insufficient documentation

## 2017-10-12 DIAGNOSIS — I6523 Occlusion and stenosis of bilateral carotid arteries: Secondary | ICD-10-CM | POA: Insufficient documentation

## 2017-10-12 DIAGNOSIS — I10 Essential (primary) hypertension: Secondary | ICD-10-CM | POA: Insufficient documentation

## 2017-10-12 DIAGNOSIS — E119 Type 2 diabetes mellitus without complications: Secondary | ICD-10-CM | POA: Diagnosis not present

## 2017-10-16 ENCOUNTER — Telehealth: Payer: Self-pay

## 2017-10-16 ENCOUNTER — Other Ambulatory Visit: Payer: Self-pay

## 2017-10-16 DIAGNOSIS — R0989 Other specified symptoms and signs involving the circulatory and respiratory systems: Secondary | ICD-10-CM

## 2017-10-16 NOTE — Telephone Encounter (Signed)
Spoke to pt and advise of appt for doppler 10-19-17. Pt says he will call vein and vas to change appt . [place]

## 2017-10-18 ENCOUNTER — Encounter: Payer: Self-pay | Admitting: Podiatry

## 2017-10-18 ENCOUNTER — Ambulatory Visit: Payer: Medicare HMO | Admitting: Podiatry

## 2017-10-18 VITALS — BP 195/78 | HR 78 | Ht 68.0 in | Wt 225.0 lb

## 2017-10-18 DIAGNOSIS — M79671 Pain in right foot: Secondary | ICD-10-CM

## 2017-10-18 DIAGNOSIS — B351 Tinea unguium: Secondary | ICD-10-CM | POA: Diagnosis not present

## 2017-10-18 DIAGNOSIS — M79672 Pain in left foot: Secondary | ICD-10-CM

## 2017-10-18 NOTE — Patient Instructions (Signed)
Seen for hypertrophic nails. All nails debrided. Return in 3 months or as needed.  

## 2017-10-18 NOTE — Progress Notes (Signed)
SUBJECTIVE: 77 y.o. year old male presents for diabetic foot care. Patient is ambulatory without assistance.  Patient is referred by Dr. Redmond School.  Diabetic x 10 years. This morning was 128.  Review of Systems  Constitutional: Negative.   HENT: Negative.   Eyes: Negative.   Respiratory: Negative.   Cardiovascular: Negative.   Gastrointestinal: Negative.   Genitourinary: Negative.   Musculoskeletal: Negative.   Skin: Negative.      OBJECTIVE: DERMATOLOGIC EXAMINATION: Thick dystrophic nails x 9. Missing right great toe nail.  VASCULAR EXAMINATION OF LOWER LIMBS: All pedal pulses are palpable with normal pulsation.   Capillary Filling times within 3 seconds in all digits.  No edema or erythema noted. Temperature gradient from tibial crest to dorsum of foot is within normal bilateral.  NEUROLOGIC EXAMINATION OF THE LOWER LIMBS: Achilles DTR is present and within normal. Monofilament (Semmes-Weinstein 10-gm) sensory testing positive 6 out of 6, bilateral. Vibratory sensations(128Hz  turning fork) intact at medial and lateral forefoot bilateral.  Sharp and Dull discriminatory sensations at the plantar ball of hallux is intact bilateral.   MUSCULOSKELETAL EXAMINATION: No gross deformities noted.  ASSESSMENT: Onychomycosis x 9. Painful nails.  PLAN: Reviewed findings. All nails debrided. Return in 3 months.

## 2017-10-19 ENCOUNTER — Encounter (HOSPITAL_COMMUNITY): Payer: Medicare HMO

## 2017-10-20 ENCOUNTER — Ambulatory Visit (HOSPITAL_COMMUNITY)
Admission: RE | Admit: 2017-10-20 | Discharge: 2017-10-20 | Disposition: A | Discharge status: Home / Self Care | Payer: Medicare HMO | Source: Ambulatory Visit | Attending: Family Medicine | Admitting: Family Medicine

## 2017-10-20 DIAGNOSIS — R0989 Other specified symptoms and signs involving the circulatory and respiratory systems: Secondary | ICD-10-CM | POA: Diagnosis not present

## 2017-10-20 DIAGNOSIS — I70202 Unspecified atherosclerosis of native arteries of extremities, left leg: Secondary | ICD-10-CM | POA: Insufficient documentation

## 2017-10-23 ENCOUNTER — Other Ambulatory Visit: Payer: Self-pay

## 2017-10-23 NOTE — Progress Notes (Signed)
Put in referral for vas. Surgery . Pt will be seen for arterial blockage with Dr. Trula Slade. He will be seen  November 04 at 12 pm. Vascular surgery will contact pt to advise of appt. [employer].

## 2017-10-24 DIAGNOSIS — R69 Illness, unspecified: Secondary | ICD-10-CM | POA: Diagnosis not present

## 2017-11-02 DIAGNOSIS — R69 Illness, unspecified: Secondary | ICD-10-CM | POA: Diagnosis not present

## 2017-12-11 ENCOUNTER — Ambulatory Visit: Payer: Medicare HMO | Admitting: Surgery

## 2017-12-11 ENCOUNTER — Encounter: Payer: Self-pay | Admitting: Surgery

## 2017-12-11 ENCOUNTER — Other Ambulatory Visit: Payer: Self-pay

## 2017-12-11 VITALS — BP 204/80 | HR 60 | Temp 98.0°F | Resp 18 | Ht 68.0 in | Wt 218.0 lb

## 2017-12-11 DIAGNOSIS — I6522 Occlusion and stenosis of left carotid artery: Secondary | ICD-10-CM | POA: Diagnosis not present

## 2017-12-11 DIAGNOSIS — I70212 Atherosclerosis of native arteries of extremities with intermittent claudication, left leg: Secondary | ICD-10-CM | POA: Diagnosis not present

## 2017-12-11 MED ORDER — CILOSTAZOL 100 MG PO TABS: 100.0000 mg | ORAL_TABLET | Freq: Two times a day (BID) | ORAL | 11 refills | Status: DC

## 2017-12-11 NOTE — Progress Notes (Signed)
[facility]  Patient name: Shaun Kennedy MRN: [id number] DOB: 07/29/1940 Sex: male   REQUESTING PROVIDER:    Dr. Redmond School   REASON FOR CONSULT:    Claudication  HISTORY OF PRESENT ILLNESS:   Shaun Kennedy is a 77 y.o. male, who is referred today for evaluation of multiple vascular issues.  He was recently found to be hypertensive and had a renal ultrasound performed which showed no significant renal artery stenosis.  He also had a carotid duplex which showed minimal stenosis.  He has denied any neurologic symptoms such as numbness or weakness in either extremity, slurred speech, or amaurosis fugax.  Patient also has left calf cramping with walking.  It occurs approximately 1/2 mile.  It resolves with rest.  He walks 3 miles approximately 3 times a week.  He does not have any open wounds.  He does not have rest pain.  Patient suffers from diabetes.  This is relatively well-controlled with a most recent hemoglobin A1c of 6.5.  He is on a statin for hypercholesterolemia.  He takes an ACE inhibitor for hypertension.  He has a former smoker.  PAST MEDICAL HISTORY    Past Medical History:  Diagnosis Date  . Allergic rhinitis   . Asthma   . Carotid stenosis   . Diabetes mellitus ([place])   . Diverticulosis   . Dyslipidemia   . ED (erectile dysfunction)   . Hemorrhoids   . HTN (hypertension)   . LVH (left ventricular hypertrophy)    on EKG  . Prostate cancer ([place])   . Smoker    former     FAMILY HISTORY   Family History  Problem Relation Age of Onset  . Heart failure Mother   . Stroke Father     SOCIAL HISTORY:   Social History   Socioeconomic History  . Marital status: Married    Spouse name: Not on file  . Number of children: Not on file  . Years of education: Not on file  . Highest education level: Not on file  Occupational History  . Not on file  Social Needs  . Financial resource strain: Not on  file  . Food insecurity:    Worry: Not on file    Inability: Not on file  . Transportation needs:    Medical: Not on file    Non-medical: Not on file  Tobacco Use  . Smoking status: Former Smoker    Last attempt to quit: 10/09/1987    Years since quitting: 30.1  . Smokeless tobacco: Never Used  Substance and Sexual Activity  . Alcohol use: No  . Drug use: No  . Sexual activity: Yes  Lifestyle  . Physical activity:    Days per week: Not on file    Minutes per session: Not on file  . Stress: Not on file  Relationships  . Social connections:    Talks on phone: Not on file    Gets together: Not on file    Attends religious service: Not on file    Active member of club or organization: Not on file    Attends meetings of clubs or organizations: Not on file    Relationship status: Not on file  . Intimate partner violence:    Fear of current or ex partner: Not on file    Emotionally abused: Not on file    Physically abused: Not on file    Forced sexual activity: Not on file  Other  Topics Concern  . Not on file  Social History Narrative   Married, no pets    ALLERGIES:    No Known Allergies  CURRENT MEDICATIONS:    Current Outpatient Medications  Medication Sig Dispense Refill  . aspirin EC 81 MG tablet Take 81 mg by mouth daily.    . Blood Glucose Monitoring Suppl (ONETOUCH VERIO) w/Device KIT USE TO CHECK SUGAR DAILY 1 kit 0  . Blood Glucose Monitoring Suppl (ONETOUCH VERIO) w/Device KIT USE TO CHECK SUGAR DAILY 1 kit 0  . cholecalciferol (VITAMIN D) 1000 units tablet Take 1,000 Units by mouth daily.    Marland Kitchen LANCETS MICRO THIN 33G MISC Patient is to test two times a day DX: E11.9 (Onetouch verio flex) 100 each 12  . lisinopril-hydrochlorothiazide (PRINZIDE,ZESTORETIC) 20-12.5 MG tablet TAKE 1 TABLET DAILY 90 tablet 3  . metFORMIN (GLUCOPHAGE-XR) 500 MG 24 hr tablet TAKE 1 TABLET ONCE DAILY   WITH BREAKFAST 90 tablet 1  . Multiple Vitamin (MULTIVITAMIN WITH MINERALS) TABS  tablet Take 1 tablet by mouth daily.    Glory Rosebush VERIO test strip USE AS INSTRUCTED TO CHECK TWICE DAILY 200 each 5  . simvastatin (ZOCOR) 40 MG tablet TAKE 1 TABLET DAILY 90 tablet 3  . solifenacin (VESICARE) 5 MG tablet Take 1 tablet (5 mg total) by mouth daily. 30 tablet 1   No current facility-administered medications for this visit.     REVIEW OF SYSTEMS:   '[X]'  denotes positive finding, '[ ]'  denotes negative finding Cardiac  Comments:  Chest pain or chest pressure:    Shortness of breath upon exertion:    Short of breath when lying flat:    Irregular heart rhythm:        Vascular    Pain in calf, thigh, or hip brought on by ambulation: x   Pain in feet at night that wakes you up from your sleep:     Blood clot in your veins:    Leg swelling:         Pulmonary    Oxygen at home:    Productive cough:     Wheezing:         Neurologic    Sudden weakness in arms or legs:     Sudden numbness in arms or legs:     Sudden onset of difficulty speaking or slurred speech:    Temporary loss of vision in one eye:     Problems with dizziness:         Gastrointestinal    Blood in stool:      Vomited blood:         Genitourinary    Burning when urinating:     Blood in urine:        Psychiatric    Major depression:         Hematologic    Bleeding problems:    Problems with blood clotting too easily:        Skin    Rashes or ulcers:        Constitutional    Fever or chills:     PHYSICAL EXAM:   Vitals:   12/11/17 1222 12/11/17 1227  BP: (!) 202/78 (!) 204/80  Pulse: 60 60  Resp: 18   Temp: 98 F (36.7 C)   TempSrc: Oral   SpO2: 97%   Weight: 218 lb (98.9 kg)   Height: '5\' 8"'  (1.727 m)     GENERAL: The patient is a well-nourished male, in  no acute distress. The vital signs are documented above. CARDIAC: There is a regular rate and rhythm.  VASCULAR: Palpable posterior tibial pulse bilaterally.  No carotid bruits. PULMONARY: Nonlabored respirations ABDOMEN:  Soft and non-tender.  No pulsatile mass MUSCULOSKELETAL: There are no major deformities or cyanosis. NEUROLOGIC: No focal weakness or paresthesias are detected. SKIN: There are no ulcers or rashes noted. PSYCHIATRIC: The patient has a normal affect.  STUDIES:   I have ordered and reviewed the following vascular lab studies:  Carotid duplex: Right Carotid: Velocities in the right ICA are consistent with a 1-39% stenosis.        Non-hemodynamically significant plaque <50% noted in the CCA. The        ECA appears <50% stenosed.  Left Carotid: Velocities in the left ICA are consistent with a 40-59% stenosis.       Non-hemodynamically significant plaque noted in the CCA. The ECA       appears <50% stenosed.  Vertebrals: Bilateral vertebral arteries demonstrate antegrade flow. Subclavians: Normal flow hemodynamics were seen in bilateral subclavian       arteries.  Renal duplex: Renal:  Right: Normal size right kidney. No evidence of right renal artery    stenosis. RRV flow present. Left: Normal size of left kidney. No evidence of left renal artery    stenosis. LRV flow present.  ABI: +-------+-----------+-----------+------------+------------+ ABI/TBIToday's ABIToday's TBIPrevious ABIPrevious TBI +-------+-----------+-----------+------------+------------+ Right 0.93    0.68    none           +-------+-----------+-----------+------------+------------+ Left  0.81    0.63    none           +-------+-----------+-----------+------------+------------+  Left leg: Left: 75-99% stenosis noted in the superficial femoral artery. No color flow noted in the peroneal artery.    ASSESSMENT and PLAN   Carotid: The patient will be scheduled for follow-up carotid duplex in 1 year  Claudication: I am starting the patient on cilostazol.  He is unsure as to whether or not he has been on this in  the past and we will verify this with Dr. Redmond School.  He is on all of the appropriate medications for atherosclerotic vascular disease.  I stressed the importance of blood sugar control.  We also discussed the importance of monitoring his feet on a daily basis.  I am scheduling him for follow-up in 1 year.  He knows to contact me should he have a change in his symptoms.  Hypertension: No evidence of renal artery stenosis on duplex   Annamarie Major, MD [facility] [phone] Pager [phone]

## 2017-12-11 NOTE — Progress Notes (Signed)
Vitals:   12/11/17 1222  BP: (!) 202/78  Pulse: 60  Resp: 18  Temp: 98 F (36.7 C)  TempSrc: Oral  SpO2: 97%  Weight: 218 lb (98.9 kg)  Height: 5\' 8"  (1.727 m)

## 2017-12-12 ENCOUNTER — Other Ambulatory Visit: Payer: Self-pay | Admitting: Family Medicine

## 2018-01-17 ENCOUNTER — Ambulatory Visit: Payer: Medicare HMO | Admitting: Podiatry

## 2018-01-22 DIAGNOSIS — H35363 Drusen (degenerative) of macula, bilateral: Secondary | ICD-10-CM | POA: Diagnosis not present

## 2018-01-22 DIAGNOSIS — H2512 Age-related nuclear cataract, left eye: Secondary | ICD-10-CM | POA: Diagnosis not present

## 2018-01-22 DIAGNOSIS — E119 Type 2 diabetes mellitus without complications: Secondary | ICD-10-CM | POA: Diagnosis not present

## 2018-01-22 DIAGNOSIS — H40013 Open angle with borderline findings, low risk, bilateral: Secondary | ICD-10-CM | POA: Diagnosis not present

## 2018-01-22 LAB — HM DIABETES EYE EXAM

## 2018-03-27 ENCOUNTER — Other Ambulatory Visit: Payer: Self-pay

## 2018-03-27 ENCOUNTER — Telehealth: Payer: Self-pay | Admitting: Family Medicine

## 2018-03-27 DIAGNOSIS — E119 Type 2 diabetes mellitus without complications: Secondary | ICD-10-CM

## 2018-03-27 DIAGNOSIS — R69 Illness, unspecified: Secondary | ICD-10-CM | POA: Diagnosis not present

## 2018-03-27 DIAGNOSIS — N3281 Overactive bladder: Secondary | ICD-10-CM

## 2018-03-27 DIAGNOSIS — I152 Hypertension secondary to endocrine disorders: Secondary | ICD-10-CM

## 2018-03-27 DIAGNOSIS — E1159 Type 2 diabetes mellitus with other circulatory complications: Secondary | ICD-10-CM

## 2018-03-27 DIAGNOSIS — I10 Essential (primary) hypertension: Principal | ICD-10-CM

## 2018-03-27 MED ORDER — LISINOPRIL-HYDROCHLOROTHIAZIDE 20-12.5 MG PO TABS: 1.0000 | ORAL_TABLET | Freq: Every day | ORAL | 2 refills | Status: DC

## 2018-03-27 MED ORDER — LANCETS MICRO THIN 33G MISC: 12 refills | Status: DC

## 2018-03-27 MED ORDER — SOLIFENACIN SUCCINATE 5 MG PO TABS: 5.0000 mg | ORAL_TABLET | Freq: Every day | ORAL | 2 refills | Status: DC

## 2018-03-27 MED ORDER — ONETOUCH VERIO W/DEVICE KIT: PACK | 0 refills | Status: DC

## 2018-03-27 MED ORDER — SIMVASTATIN 40 MG PO TABS: 40.0000 mg | ORAL_TABLET | Freq: Every day | ORAL | 2 refills | Status: DC

## 2018-03-27 MED ORDER — METFORMIN HCL ER 500 MG PO TB24: ORAL_TABLET | ORAL | 1 refills | Status: DC

## 2018-03-27 NOTE — Telephone Encounter (Signed)
Pt called and said pharmacy told him he needed a prescription on his Metformin, Lysinopril and solifenacin. Pt also needs more diabetic supplies, needles and test strips

## 2018-03-28 DIAGNOSIS — R69 Illness, unspecified: Secondary | ICD-10-CM | POA: Diagnosis not present

## 2018-03-30 ENCOUNTER — Telehealth: Payer: Self-pay | Admitting: Medical

## 2018-03-30 NOTE — Telephone Encounter (Signed)
P.A. VESICARE

## 2018-04-07 NOTE — Telephone Encounter (Signed)
P.A. approved til 02/06/19

## 2018-04-09 ENCOUNTER — Encounter: Payer: Self-pay | Admitting: Family Medicine

## 2018-04-09 ENCOUNTER — Ambulatory Visit (INDEPENDENT_AMBULATORY_CARE_PROVIDER_SITE_OTHER): Payer: Medicare HMO | Admitting: Family Medicine

## 2018-04-09 VITALS — BP 166/90 | HR 63 | Temp 97.9°F | Wt 214.0 lb

## 2018-04-09 DIAGNOSIS — Z8546 Personal history of malignant neoplasm of prostate: Secondary | ICD-10-CM

## 2018-04-09 DIAGNOSIS — E1169 Type 2 diabetes mellitus with other specified complication: Secondary | ICD-10-CM

## 2018-04-09 DIAGNOSIS — N3281 Overactive bladder: Secondary | ICD-10-CM | POA: Diagnosis not present

## 2018-04-09 DIAGNOSIS — I10 Essential (primary) hypertension: Secondary | ICD-10-CM | POA: Diagnosis not present

## 2018-04-09 DIAGNOSIS — E785 Hyperlipidemia, unspecified: Secondary | ICD-10-CM

## 2018-04-09 DIAGNOSIS — B351 Tinea unguium: Secondary | ICD-10-CM | POA: Diagnosis not present

## 2018-04-09 DIAGNOSIS — E119 Type 2 diabetes mellitus without complications: Secondary | ICD-10-CM | POA: Diagnosis not present

## 2018-04-09 DIAGNOSIS — E1159 Type 2 diabetes mellitus with other circulatory complications: Secondary | ICD-10-CM | POA: Diagnosis not present

## 2018-04-09 DIAGNOSIS — I152 Hypertension secondary to endocrine disorders: Secondary | ICD-10-CM

## 2018-04-09 NOTE — Progress Notes (Signed)
  Subjective:    Patient ID: Shaun Kennedy, male    DOB: 08-05-1940, 78 y.o.   MRN: [id number]  Shaun Kennedy is a 78 y.o. male who presents for follow-up of Type 2 diabetes mellitus.  Patient is checking home blood sugars.   Home blood sugar records: meter record  How often is blood sugars being checked: QD fasting and post meal pt was advise to check two hours post meal 96-143 Current symptoms/problems include none at this time. Daily foot checks: yes   Any foot concerns: none Last eye exam: dec 2019 Exercise: golf , walking  Continues to be followed by Dr. Trula Slade for his underlying vascular disease.  He is now walking much longer than in the past.  He also continues on Vesicare for treatment of his OAB and seems to be doing well with that.  Continues on metformin, lisinopril/HCTZ, Zocor.  He is also taking Pletal The following portions of the patient's history were reviewed and updated as appropriate: allergies, current medications, past medical history, past social history and problem list.  ROS as in subjective above.     Objective:    Physical Exam Alert and in no distress otherwise not examined.  A1c is 6.5 Lab Review Diabetic Labs Latest Ref Rng & Units 10/11/2017 05/10/2017 04/24/2017 12/12/2016 08/08/2016  HbA1c 4.0 - 5.6 % 6.5(A) 6.2 6.2 6.6 6.6  Microalbumin mg/L >300.00 - - - 9.5  Micro/Creat Ratio - >262.0 - - - 19.2  Chol 100 - 199 mg/dL 173 - - - 173  HDL >39 mg/dL 88 - - - 84  Calc LDL 0 - 99 mg/dL 70 - - - 71  Triglycerides 0 - 149 mg/dL 75 - - - 88  Creatinine 0.76 - 1.27 mg/dL 1.20 - - - 1.25(H)   BP/Weight 12/11/2017 10/18/2017 10/11/2017 04/24/2017 00/08/6224  Systolic BP 333 545 625 638 937  Diastolic BP 80 78 98 72 80  Wt. (Lbs) 218 225 225.8 226.8 225.2  BMI 33.15 34.21 34.33 34.48 34.24   Foot/eye exam completion dates Latest Ref Rng & Units 01/22/2018 01/22/2018  Eye Exam No Retinopathy Retinopathy(A) No Retinopathy  Foot Form Completion - - -    Shaun Kennedy   reports that he quit smoking about 30 years ago. He has never used smokeless tobacco. He reports that he does not drink alcohol or use drugs.     Assessment & Plan:    Type 2 diabetes mellitus without complication, without long-term current use of insulin (HCC)  Onychomycosis of multiple toenails with type 2 diabetes mellitus (HCC)  OAB (overactive bladder)  Hypertension associated with diabetes ([place])  Hyperlipidemia associated with type 2 diabetes mellitus ([place])  History of prostate cancer   1. Rx changes: none 2. Education: Reviewed 'ABCs' of diabetes management (respective goals in parentheses):  A1C (<7), blood pressure (<130/80), and cholesterol (LDL <100). 3. Compliance at present is estimated to be excellent. Efforts to improve compliance (if necessary) will be directed at increased exercise. 4. Follow up: 4 months

## 2018-04-09 NOTE — Telephone Encounter (Signed)
Pt informed

## 2018-04-09 NOTE — Patient Instructions (Signed)
In here check your blood sugars either before a meal or 2 hours after a meal

## 2018-06-21 DIAGNOSIS — R69 Illness, unspecified: Secondary | ICD-10-CM | POA: Diagnosis not present

## 2018-09-16 ENCOUNTER — Other Ambulatory Visit: Payer: Self-pay | Admitting: Family Medicine

## 2018-09-20 DIAGNOSIS — R69 Illness, unspecified: Secondary | ICD-10-CM | POA: Diagnosis not present

## 2018-09-28 ENCOUNTER — Other Ambulatory Visit: Payer: Self-pay | Admitting: Family Medicine

## 2018-10-17 ENCOUNTER — Other Ambulatory Visit: Payer: Self-pay

## 2018-10-17 ENCOUNTER — Encounter: Payer: Self-pay | Admitting: Family Medicine

## 2018-10-17 ENCOUNTER — Ambulatory Visit (INDEPENDENT_AMBULATORY_CARE_PROVIDER_SITE_OTHER): Payer: Medicare HMO | Admitting: Family Medicine

## 2018-10-17 VITALS — BP 170/96 | HR 67 | Temp 98.4°F | Ht 67.0 in | Wt 208.2 lb

## 2018-10-17 DIAGNOSIS — J309 Allergic rhinitis, unspecified: Secondary | ICD-10-CM

## 2018-10-17 DIAGNOSIS — Z23 Encounter for immunization: Secondary | ICD-10-CM | POA: Diagnosis not present

## 2018-10-17 DIAGNOSIS — E1159 Type 2 diabetes mellitus with other circulatory complications: Secondary | ICD-10-CM | POA: Diagnosis not present

## 2018-10-17 DIAGNOSIS — Z8709 Personal history of other diseases of the respiratory system: Secondary | ICD-10-CM

## 2018-10-17 DIAGNOSIS — R200 Anesthesia of skin: Secondary | ICD-10-CM

## 2018-10-17 DIAGNOSIS — E119 Type 2 diabetes mellitus without complications: Secondary | ICD-10-CM

## 2018-10-17 DIAGNOSIS — I10 Essential (primary) hypertension: Secondary | ICD-10-CM

## 2018-10-17 DIAGNOSIS — E1121 Type 2 diabetes mellitus with diabetic nephropathy: Secondary | ICD-10-CM

## 2018-10-17 DIAGNOSIS — N3281 Overactive bladder: Secondary | ICD-10-CM | POA: Diagnosis not present

## 2018-10-17 DIAGNOSIS — Z0000 Encounter for general adult medical examination without abnormal findings: Secondary | ICD-10-CM

## 2018-10-17 DIAGNOSIS — I6522 Occlusion and stenosis of left carotid artery: Secondary | ICD-10-CM | POA: Diagnosis not present

## 2018-10-17 DIAGNOSIS — E1136 Type 2 diabetes mellitus with diabetic cataract: Secondary | ICD-10-CM | POA: Diagnosis not present

## 2018-10-17 DIAGNOSIS — R202 Paresthesia of skin: Secondary | ICD-10-CM

## 2018-10-17 DIAGNOSIS — Z8546 Personal history of malignant neoplasm of prostate: Secondary | ICD-10-CM | POA: Diagnosis not present

## 2018-10-17 DIAGNOSIS — E1169 Type 2 diabetes mellitus with other specified complication: Secondary | ICD-10-CM | POA: Diagnosis not present

## 2018-10-17 DIAGNOSIS — I152 Hypertension secondary to endocrine disorders: Secondary | ICD-10-CM

## 2018-10-17 DIAGNOSIS — E785 Hyperlipidemia, unspecified: Secondary | ICD-10-CM

## 2018-10-17 LAB — POCT GLYCOSYLATED HEMOGLOBIN (HGB A1C): Hemoglobin A1C: 6.4 % — AB (ref 4.0–5.6)

## 2018-10-17 LAB — POCT UA - MICROALBUMIN
Albumin/Creatinine Ratio, Urine, POC: 71.4
Creatinine, POC: 181.4 mg/dL
Microalbumin Ur, POC: 129.6 mg/L

## 2018-10-17 MED ORDER — LISINOPRIL-HYDROCHLOROTHIAZIDE 20-12.5 MG PO TABS: 1.0000 | ORAL_TABLET | Freq: Every day | ORAL | 2 refills | Status: DC

## 2018-10-17 MED ORDER — SOLIFENACIN SUCCINATE 5 MG PO TABS: 5.0000 mg | ORAL_TABLET | Freq: Every day | ORAL | 3 refills | Status: DC

## 2018-10-17 MED ORDER — METFORMIN HCL ER 500 MG PO TB24: ORAL_TABLET | ORAL | 1 refills | Status: DC

## 2018-10-17 MED ORDER — AMLODIPINE BESYLATE 5 MG PO TABS: 5.0000 mg | ORAL_TABLET | Freq: Every day | ORAL | 3 refills | Status: DC

## 2018-10-17 MED ORDER — SIMVASTATIN 40 MG PO TABS: 40.0000 mg | ORAL_TABLET | Freq: Every day | ORAL | 3 refills | Status: DC

## 2018-10-17 MED ORDER — CILOSTAZOL 100 MG PO TABS: 100.0000 mg | ORAL_TABLET | Freq: Two times a day (BID) | ORAL | 3 refills | Status: DC

## 2018-10-17 NOTE — Patient Instructions (Signed)
  [name] , Thank you for taking time to come for your Medicare Wellness Visit. I appreciate your ongoing commitment to your health goals. Please review the following plan we discussed and let me know if I can assist you in the future.   These are the goals we discussed: He would like a referral to a different hand specialist.  He did have an MRI done however it was through [facility] and he would prefer to stay in the system. I will add amlodipine to his regimen for his blood pressure. He is to continue on all of his other medications.  He will follow-up with cardiovascular as previously scheduled.  He will also see ophthalmology in the near future.  This is a list of the screening recommended for you and due dates:  Health Maintenance  Topic Date Due  . Tetanus Vaccine  12/05/2017  . Hemoglobin A1C  04/11/2018  . Flu Shot  09/08/2018  . Complete foot exam   10/12/2018  . Eye exam for diabetics  01/23/2019  . Pneumonia vaccines  Completed

## 2018-10-17 NOTE — Progress Notes (Signed)
Shaun Kennedy is a 78 y.o. male who presents for CPE, annual wellness visit and follow-up on chronic medical conditions.  He has diabetes and presently is taking metformin daily and having no difficulty with that.  He continues on simvastatin and is having no aches or pains with that.  He is taking Vesicare for overactive bladder with good results.  He does have underlying carotid stenosis and does follow-up with cardiovascular on a yearly basis.  Presently he is on Pletal.  No weakness, numbness, tingling sensation except in his right hand.  He has had difficulty with this in the past and has been seen by Dr. Fredna Dow.  He states that all of his fingers get numb and tingly although fourth and fifth fingers apparently were worse.  He does have underlying allergies which are under good control.  Does have a history of asthma but is not on any medications for that.  Does see his ophthalmologist for cataracts regularly.   Immunizations and Health Maintenance Immunization History  Administered Date(s) Administered  . Influenza, High Dose Seasonal PF 12/02/2015, 12/12/2016, 11/02/2017  . Pneumococcal Conjugate-13 06/25/2014  . Pneumococcal Polysaccharide-23 06/03/2010  . Tdap 12/06/2007  . Zoster 02/08/2008  . Zoster Recombinat (Shingrix) 02/25/2017, 04/26/2017   Health Maintenance Due  Topic Date Due  . TETANUS/TDAP  12/05/2017  . HEMOGLOBIN A1C  04/11/2018  . INFLUENZA VACCINE  09/08/2018  . FOOT EXAM  10/12/2018    Last colonoscopy: 02-07-2005 Last PSA:06-11-12 Dentist: over one year Ophtho:01-22-18 Exercise:QD walking presently he is helping his wife who is recovering from knee surgery.  Other doctors caring for patient include: Kathlen Mody, ophthalmology.  Brabham, CVTS  Advanced Directives: Does Patient Have a Medical Advance Directive?: Yes Type of Advance Directive: [facility], Out of facility DNR (pink MOST or yellow form) Does patient want to make changes to medical advance  directive?: No - Patient declined Copy of [place] in Chart?: No - copy [place] given Depression screen:  See questionnaire below.     Depression screen [facility] 2/9 10/17/2018 10/11/2017 08/08/2016 07/22/2015 10/29/2014  Decreased Interest 0 0 0 0 0  Down, Depressed, Hopeless 0 0 0 0 0  PHQ - 2 Score 0 0 0 0 0    Fall Screen: See Questionaire below.   Fall Risk  10/17/2018 10/11/2017 08/08/2016 07/22/2015 01/24/2014  Falls in the past year? 0 No No No No    ADL screen:  See questionnaire below.  Functional Status Survey: Is the patient deaf or have difficulty hearing?: No Does the patient have difficulty seeing, even when wearing glasses/contacts?: No Does the patient have difficulty concentrating, remembering, or making decisions?: No Does the patient have difficulty walking or climbing stairs?: No Does the patient have difficulty dressing or bathing?: No Does the patient have difficulty doing errands alone such as visiting a doctor's office or shopping?: No   Review of Systems  Constitutional: -, -unexpected weight change, -anorexia, -fatigue Allergy: -sneezing, -itching, -congestion Dermatology: denies changing moles, rash, lumps ENT: -runny nose, -ear pain, -sore throat,  Cardiology:  -chest pain, -palpitations, -orthopnea, Respiratory: -cough, -shortness of breath, -dyspnea on exertion, -wheezing,  Gastroenterology: -abdominal pain, -nausea, -vomiting, -diarrhea, -constipation, -dysphagia Hematology: -bleeding or bruising problems Musculoskeletal: -arthralgias, -myalgias, -joint swelling, -back pain, - Ophthalmology: -vision changes,  Urology: -dysuria, -difficulty urinating,  -urinary frequency, -urgency, incontinence Neurology: -, -numbness, , -memory loss, -falls, -dizziness    PHYSICAL EXAM:  BP (!) 170/96 (BP Location: Left  Arm, Patient Position: Sitting)   Pulse 67   Temp 98.4 F (36.9 C)   Ht 5\' 7"  (1.702 m)   Wt 208 lb 3.2 oz (94.4  kg)   SpO2 95%   BMI 32.61 kg/m   General Appearance: Alert, cooperative, no distress, appears stated age Head: Normocephalic, without obvious abnormality, atraumatic Eyes: PERRL, conjunctiva/corneas clear, EOM's intact, fundi benign Ears: Normal TM's and external ear canals Nose: Nares normal, mucosa normal, no drainage or sinus   tenderness Throat: Lips, mucosa, and tongue normal; teeth and gums normal Neck: Supple, no lymphadenopathy, thyroid:no enlargement/tenderness/nodules; no carotid bruit or JVD Lungs: Clear to auscultation bilaterally without wheezes, rales or ronchi; respirations unlabored Heart: Regular rate and rhythm, S1 and S2 normal, no murmur, rub or gallop Abdomen: Soft, non-tender, nondistended, normoactive bowel sounds, no masses, no hepatosplenomegaly Extremities: No clubbing, cyanosis or edema.  Exam of the right hand shows normal strength and sensation.  Pulses are normal. Pulses: 2+ and symmetric all extremities Skin: Skin color, texture, turgor normal, no rashes or lesions Lymph nodes: Cervical, supraclavicular, and axillary nodes normal Neurologic: CNII-XII intact, normal strength, sensation and gait; reflexes 2+ and symmetric throughout   Psych: Normal mood, affect, hygiene and grooming Urine microalbumin does show A/C ratio of 71.4 ASSESSMENT/PLAN: Routine general medical examination at a health care facility  Type 2 diabetes mellitus without complication, without long-term current use of insulin (HCC) - Plan: POCT glycosylated hemoglobin (Hb A1C), CBC with Differential/Platelet, Comprehensive metabolic panel, Lipid panel, POCT UA - Microalbumin  History of prostate cancer  Hypertension associated with diabetes ([place]) - Plan: CBC with Differential/Platelet, Comprehensive metabolic panel, amLODipine (NORVASC) 5 MG tablet  Hyperlipidemia associated with type 2 diabetes mellitus (HCC) - Plan: Lipid panel  History of asthma  Allergic rhinitis, mild  Stenosis  of left carotid artery  Need for influenza vaccination - Plan: Flu Vaccine QUAD High Dose(Fluad)  OAB (overactive bladder)  Cataract associated with type 2 diabetes mellitus (HCC)  Diabetic nephropathy associated with type 2 diabetes mellitus (HCC)  Numbness and tingling in right hand He would like a referral to a different hand specialist.  He did have an MRI done however it was through [place] and he would prefer to stay in the system. I will add amlodipine to his regimen for his blood pressure. He is to continue on all of his other medications.  He will follow-up with cardiovascular as previously scheduled.  He will also see ophthalmology in the near future. No follow-up needed on prostate cancer, allergies, asthma.   , recommended at least 30 minutes of aerobic activity at least 5 days/week;  healthy diet and alcohol recommendations (less than or equal to 2 drinks/day) reviewed;  Immunization recommendations discussed.    Medicare Attestation I have personally reviewed: The patient's medical and social history Their use of alcohol, tobacco or illicit drugs Their current medications and supplements The patient's functional ability including ADLs,fall risks, home safety risks, cognitive, and hearing and visual impairment Diet and physical activities Evidence for depression or mood disorders  The patient's weight, height, and BMI have been recorded in the chart.  I have made referrals, counseling, and provided education to the patient based on review of the above and I have provided the patient with a written personalized care plan for preventive services.     Jill Alexanders, MD   10/17/2018

## 2018-10-18 ENCOUNTER — Ambulatory Visit (INDEPENDENT_AMBULATORY_CARE_PROVIDER_SITE_OTHER): Payer: Medicare HMO | Admitting: Orthopaedic Surgery

## 2018-10-18 ENCOUNTER — Telehealth: Payer: Self-pay | Admitting: Family Medicine

## 2018-10-18 ENCOUNTER — Encounter: Payer: Self-pay | Admitting: Orthopaedic Surgery

## 2018-10-18 ENCOUNTER — Ambulatory Visit (INDEPENDENT_AMBULATORY_CARE_PROVIDER_SITE_OTHER): Payer: Medicare HMO

## 2018-10-18 VITALS — Ht 68.0 in | Wt 209.0 lb

## 2018-10-18 DIAGNOSIS — R202 Paresthesia of skin: Secondary | ICD-10-CM

## 2018-10-18 LAB — CBC WITH DIFFERENTIAL/PLATELET
Basophils Absolute: 0 10*3/uL (ref 0.0–0.2)
Basos: 0 %
EOS (ABSOLUTE): 0.5 10*3/uL — ABNORMAL HIGH (ref 0.0–0.4)
Eos: 6 %
Hematocrit: 35.7 % — ABNORMAL LOW (ref 37.5–51.0)
Hemoglobin: 12.1 g/dL — ABNORMAL LOW (ref 13.0–17.7)
Immature Grans (Abs): 0 10*3/uL (ref 0.0–0.1)
Immature Granulocytes: 0 %
Lymphocytes Absolute: 1.8 10*3/uL (ref 0.7–3.1)
Lymphs: 25 %
MCH: 31.4 pg (ref 26.6–33.0)
MCHC: 33.9 g/dL (ref 31.5–35.7)
MCV: 93 fL (ref 79–97)
Monocytes Absolute: 0.6 10*3/uL (ref 0.1–0.9)
Monocytes: 8 %
Neutrophils Absolute: 4.3 10*3/uL (ref 1.4–7.0)
Neutrophils: 61 %
Platelets: 345 10*3/uL (ref 150–450)
RBC: 3.85 x10E6/uL — ABNORMAL LOW (ref 4.14–5.80)
RDW: 12.1 % (ref 11.6–15.4)
WBC: 7.2 10*3/uL (ref 3.4–10.8)

## 2018-10-18 LAB — COMPREHENSIVE METABOLIC PANEL
ALT: 13 IU/L (ref 0–44)
AST: 19 IU/L (ref 0–40)
Albumin/Globulin Ratio: 1 — ABNORMAL LOW (ref 1.2–2.2)
Albumin: 4.1 g/dL (ref 3.7–4.7)
Alkaline Phosphatase: 59 IU/L (ref 39–117)
BUN/Creatinine Ratio: 14 (ref 10–24)
BUN: 17 mg/dL (ref 8–27)
Bilirubin Total: 0.7 mg/dL (ref 0.0–1.2)
CO2: 24 mmol/L (ref 20–29)
Calcium: 9.9 mg/dL (ref 8.6–10.2)
Chloride: 101 mmol/L (ref 96–106)
Creatinine, Ser: 1.24 mg/dL (ref 0.76–1.27)
GFR calc Af Amer: 63 mL/min/{1.73_m2} (ref >59)
GFR calc non Af Amer: 55 mL/min/{1.73_m2} — ABNORMAL LOW (ref >59)
Globulin, Total: 4.1 g/dL (ref 1.5–4.5)
Glucose: 94 mg/dL (ref 65–99)
Potassium: 4.2 mmol/L (ref 3.5–5.2)
Sodium: 141 mmol/L (ref 134–144)
Total Protein: 8.2 g/dL (ref 6.0–8.5)

## 2018-10-18 LAB — LIPID PANEL
Chol/HDL Ratio: 2 ratio (ref 0.0–5.0)
Cholesterol, Total: 146 mg/dL (ref 100–199)
HDL: 73 mg/dL (ref >39)
LDL Chol Calc (NIH): 57 mg/dL (ref 0–99)
Triglycerides: 87 mg/dL (ref 0–149)
VLDL Cholesterol Cal: 16 mg/dL (ref 5–40)

## 2018-10-18 MED ORDER — PREDNISONE 5 MG (21) PO TBPK: ORAL_TABLET | ORAL | 0 refills | Status: DC

## 2018-10-18 NOTE — Telephone Encounter (Signed)
Shiny at [facility] has a drug interaction Level 1 between Amlodipine and Simvistatin  [phone] Please call her back.

## 2018-10-18 NOTE — Progress Notes (Signed)
Office Visit Note   Patient: Shaun Kennedy           Date of Birth: [birth year]           MRN: [id number] Visit Date: 10/18/2018              Requested by: [name], MD [address] [place],  [place] [address] PCP: [name], MD   Assessment & Plan: Visit Diagnoses:  1. Right hand paresthesia     Plan: Impression is cervical spine radiculopathy versus cubital tunnel syndrome.  We will start the patient with a Sterapred taper.  He will follow-up with [place] as needed.  Call with concerns or questions.  Follow-Up Instructions: Return if symptoms worsen or fail to improve.   Orders:  Orders Placed This Encounter  Procedures  . XR Cervical Spine 2 or 3 views   Meds ordered this encounter  Medications  . predniSONE (STERAPRED UNI-PAK 21 TAB) 5 MG (21) TBPK tablet    Sig: Take as directed    Dispense:  21 tablet    Refill:  0      Procedures: No procedures performed   Clinical Data: No additional findings.   Subjective: Chief Complaint  Patient presents with  . Right Hand - Pain    HPI patient is a pleasant 78 year old gentleman who presents our clinic today with paresthesias to the right hand.  He notes constant tingling and occasional numbness which started in the right ring and small fingers but now is occurring throughout the entire hand.  He has associated weakness when trying to grip anything especially his golf club.  He has not tried any medications for this.  He does use a glove at night which seems to somewhat help.  He is a diabetic but does not have a history of neuropathy.  No history of cervical spine pathology.  Review of Systems as detailed in HPI.  All others reviewed and are negative.   Objective: Vital Signs: Ht 5\' 8"  (1.727 m)   Wt 209 lb (94.8 kg)   BMI 31.78 kg/m   Physical Exam well-developed and well-nourished gentleman in no acute distress.  Alert and oriented x3.  Ortho Exam examination of his right hand reveals  decreased sensation to the ring and small fingers.  Full sensation to the rest of the hand.  Full grip strength.  No focal weakness.  Normal cervical spine exam.  Specialty Comments:  No specialty comments available.  Imaging: Xr Cervical Spine 2 Or 3 Views  Result Date: 10/18/2018 X-rays reveal moderate diffuse degenerative disc disease    PMFS History: Patient Active Problem List   Diagnosis Date Noted  . OAB (overactive bladder) 10/11/2017  . Diabetic nephropathy associated with type 2 diabetes mellitus ([place]) 10/11/2017  . Cataract associated with type 2 diabetes mellitus ([place]) 12/02/2015  . Onychomycosis of multiple toenails with type 2 diabetes mellitus ([place]) 07/22/2015  . Former smoker, stopped smoking in distant past 07/22/2015  . Diabetes mellitus ([place]) 06/13/2011  . ED (erectile dysfunction) 06/13/2011  . History of prostate cancer 06/13/2011  . Hypertension associated with diabetes ([place]) 06/13/2011  . Hyperlipidemia associated with type 2 diabetes mellitus ([place]) 06/13/2011  . Allergic rhinitis, mild 06/13/2011  . History of asthma 06/13/2011  . Carotid stenosis 06/13/2011   Past Medical History:  Diagnosis Date  . Allergic rhinitis   . Asthma   . Carotid stenosis   . Diabetes mellitus ([place])   . Diverticulosis   .  Dyslipidemia   . ED (erectile dysfunction)   . Hemorrhoids   . HTN (hypertension)   . LVH (left ventricular hypertrophy)    on EKG  . Prostate cancer ([place])   . Smoker    former    Family History  Problem Relation Age of Onset  . Heart failure Mother   . Stroke Father     Past Surgical History:  Procedure Laterality Date  . COLONOSCOPY  2007   Dr. Benson [place]  . I&D EXTREMITY Right 04/02/2016   Procedure: IRRIGATION AND DEBRIDEMENT GREAT TOE;  Surgeon: Leandrew Koyanagi, MD;  Location: [facility];  Service: Orthopedics;  Laterality: Right;   Social History   Occupational History  . Not on file  Tobacco Use  . Smoking status: Former Smoker    Quit date:  10/09/1987    Years since quitting: 31.0  . Smokeless tobacco: Never Used  Substance and Sexual Activity  . Alcohol use: No  . Drug use: No  . Sexual activity: Yes

## 2018-10-30 ENCOUNTER — Telehealth: Payer: Self-pay | Admitting: Physician Assistant

## 2018-10-30 DIAGNOSIS — M79601 Pain in right arm: Secondary | ICD-10-CM

## 2018-10-30 MED ORDER — DICLOFENAC SODIUM 75 MG PO TBEC: DELAYED_RELEASE_TABLET | ORAL | 0 refills | Status: DC

## 2018-10-30 NOTE — Addendum Note (Signed)
Addended byLaurann Montana on: 10/30/2018 01:24 PM   Modules accepted: Orders

## 2018-10-30 NOTE — Telephone Encounter (Signed)
Patient asking for different medication. Please advise. Thanks.

## 2018-10-30 NOTE — Telephone Encounter (Signed)
You can write for diclofenac 75 mg bid, but we should get a nerve conduction study also

## 2018-10-30 NOTE — Telephone Encounter (Signed)
rx submitted. Order for study put in. Patient advised of both.

## 2018-10-30 NOTE — Telephone Encounter (Signed)
Patient called asked for a call back concerning the medication he got for his hand. Patient said the medication did not help with the pain. The number to contact patient is [phone]

## 2018-11-01 ENCOUNTER — Telehealth: Payer: Self-pay | Admitting: Family Medicine

## 2018-11-01 NOTE — Telephone Encounter (Signed)
Pharmacist from [facility] [phone] called and states there is a drug interaction.  Patient is on Simvistatin and you prescribed Amlodipine.  Are you ok with that?  Please advise pharmacist.

## 2018-11-01 NOTE — Telephone Encounter (Signed)
yes

## 2018-11-02 NOTE — Telephone Encounter (Signed)
[facility] was advised. Kh

## 2018-11-22 ENCOUNTER — Other Ambulatory Visit: Payer: Self-pay | Admitting: Physician Assistant

## 2018-11-23 ENCOUNTER — Encounter: Payer: Self-pay | Admitting: Internal Medicine

## 2018-11-23 ENCOUNTER — Encounter: Payer: Medicare HMO | Admitting: Physical Medicine and Rehabilitation

## 2018-11-27 ENCOUNTER — Other Ambulatory Visit: Payer: Self-pay

## 2018-11-27 ENCOUNTER — Encounter: Payer: Self-pay | Admitting: Physical Medicine and Rehabilitation

## 2018-11-27 ENCOUNTER — Ambulatory Visit (INDEPENDENT_AMBULATORY_CARE_PROVIDER_SITE_OTHER): Payer: Medicare HMO | Admitting: Physical Medicine and Rehabilitation

## 2018-11-27 DIAGNOSIS — R202 Paresthesia of skin: Secondary | ICD-10-CM

## 2018-11-27 NOTE — Progress Notes (Signed)
Numeric Pain Rating Scale and Functional Assessment Average Pain 7   In the last MONTH (on 0-10 scale) has pain interfered with the following?  1. General activity like being  able to carry out your everyday physical activities such as walking, climbing stairs, carrying groceries, or moving a chair?  Rating(7)   

## 2018-11-28 NOTE — Progress Notes (Signed)
Shaun Kennedy - 78 y.o. male MRN [id number]  Date of birth: 1940/07/20  Office Visit Note: Visit Date: 11/27/2018 PCP: [name], MD Referred by: [name], MD  Subjective: Chief Complaint  Patient presents with  . Right Hand - Pain   HPI: Shaun Kennedy is a 78 y.o. male who comes in today At the request of Tawanna Cooler, PA-C for electrodiagnostic study of the right upper limb.  Patient is left-hand dominant but reports difficulty gripping and making a fist with the right hand and in particular some type of coldness or paresthesias in the ulnar 2 digits.  He does not necessarily report any elbow pain.  He denies any real radicular pain down the arms.  He does have a history of diabetes.  He has had a prior electrodiagnostic study.  Electrodiagnostic study was performed at [facility] neurology by Dr. Leta Baptist.  Results of the electrodiagnostic study are reviewed below.  This showed mild median neuropathy at the wrist bilaterally.  This did not show any ulnar changes across the elbow.  Interestingly the patient even though he remembered having an electrodiagnostic study said that he did not have any needle EMG performed or any other electrodiagnostic nerve conductions other than the ulnar nerve but according to the report this was not true.  He also did not endorse the fact that he was seen by Dr. Fredna Dow Copy.  This was all directed by his primary care physician Dr. Redmond School.  Dr. Levell July notes were reviewed and he did offer the patient surgery of the ulnar nerve decompression even though the nerve studies were not conclusive.  Patient did not want surgery at the time.  Evidently from note review all this started in the right hand sometime in November 2017 and has been ongoing since that time.  ROS Otherwise per HPI.  Assessment & Plan: Visit Diagnoses:  1. Paresthesia of skin     Plan: Impression: The above electrodiagnostic study is ABNORMAL and reveals evidence of a  moderate right median nerve entrapment at the wrist (carpal tunnel syndrome) affecting sensory and motor components.  This demonstrates slight worsening from prior electrodiagnostic study by Dr. Leta Baptist in 2018.  Once again this also demonstrates no conduction drop across the elbow of the ulnar nerve.  Very mild sensory nerve slowing at the wrist of the ulnar nerve could actually be related to temperature artifact and is very mild.  There is no significant electrodiagnostic evidence of any other focal nerve entrapment, brachial plexopathy or cervical radiculopathy  Recommendations: 1.  Follow-up with referring physician. 2.  Continue current management of symptoms.  Consider diagnostic median nerve block or carpal tunnel injection.  Meds & Orders: No orders of the defined types were placed in this encounter.   Orders Placed This Encounter  Procedures  . NCV with EMG (electromyography)    Follow-up: Return for Tawanna Cooler, PA-C.   Procedures: No procedures performed  EMG & NCV Findings: Evaluation of the right median motor nerve showed prolonged distal onset latency (4.4 ms) and decreased conduction velocity (Elbow-Wrist, 48 m/s).  The right ulnar motor nerve showed decreased conduction velocity (B Elbow-Wrist, 52 m/s).  The right median (across palm) sensory nerve showed no response (Palm) and prolonged distal peak latency (4.4 ms).  The right ulnar sensory nerve showed prolonged distal peak latency (4.0 ms) and decreased conduction velocity (Wrist-5th Digit, 35 m/s).  All remaining nerves (as indicated in the following tables) were within normal limits.  All examined muscles (as indicated in the following table) showed no evidence of electrical instability.    Impression: The above electrodiagnostic study is ABNORMAL and reveals evidence of a moderate right median nerve entrapment at the wrist (carpal tunnel syndrome) affecting sensory and motor components.  This demonstrates slight  worsening from prior electrodiagnostic study by Dr. Leta Baptist in 2018.  Once again this also demonstrates no conduction drop across the elbow of the ulnar nerve.  Very mild sensory nerve slowing at the wrist of the ulnar nerve could actually be related to temperature artifact and is very mild.  There is no significant electrodiagnostic evidence of any other focal nerve entrapment, brachial plexopathy or cervical radiculopathy  Recommendations: 1.  Follow-up with referring physician. 2.  Continue current management of symptoms.  Consider diagnostic median nerve block or carpal tunnel injection.  ___________________________ Laurence Spates FAAPMR Board Certified, American Board of Physical Medicine and Rehabilitation    Nerve Conduction Studies Anti Sensory Summary Table   Stim Site NR Peak (ms) Norm Peak (ms) P-T Amp (V) Norm P-T Amp Site1 Site2 Delta-P (ms) Dist (cm) Vel (m/s) Norm Vel (m/s)  Right Median Acr Palm Anti Sensory (2nd Digit)  31.7C  Wrist    *4.4 <3.6 13.0 >10 Wrist Palm  0.0    Palm *NR  <2.0          Right Radial Anti Sensory (Base 1st Digit)  32C  Wrist    2.4 <3.1 9.7  Wrist Base 1st Digit 2.4 0.0    Right Ulnar Anti Sensory (5th Digit)  31.7C  Wrist    *4.0 <3.7 17.4 >15.0 Wrist 5th Digit 4.0 14.0 *35 >38   Motor Summary Table   Stim Site NR Onset (ms) Norm Onset (ms) O-P Amp (mV) Norm O-P Amp Site1 Site2 Delta-0 (ms) Dist (cm) Vel (m/s) Norm Vel (m/s)  Right Median Motor (Abd Poll Brev)  32.1C  Wrist    *4.4 <4.2 9.1 >5 Elbow Wrist 4.7 22.5 *48 >50  Elbow    9.1  8.7         Right Ulnar Motor (Abd Dig Min)  32.2C  Wrist    3.5 <4.2 4.5 >3 B Elbow Wrist 4.2 22.0 *52 >53  B Elbow    7.7  7.9  A Elbow B Elbow 1.8 10.0 56 >53  A Elbow    9.5  7.5          EMG   Side Muscle Nerve Root Ins Act Fibs Psw Amp Dur Poly Recrt Int Fraser Din Comment  Right Abd Poll Brev Median C8-T1 Nml Nml Nml Nml Nml 0 Nml Nml   Right 1stDorInt Ulnar C8-T1 Nml Nml Nml Nml Nml 0 Nml Nml    Right PronatorTeres Median C6-7 Nml Nml Nml Nml Nml 0 Nml Nml   Right Biceps Musculocut C5-6 Nml Nml Nml Nml Nml 0 Nml Nml   Right Deltoid Axillary C5-6 Nml Nml Nml Nml Nml 0 Nml Nml     Nerve Conduction Studies Anti Sensory Left/Right Comparison   Stim Site L Lat (ms) R Lat (ms) L-R Lat (ms) L Amp (V) R Amp (V) L-R Amp (%) Site1 Site2 L Vel (m/s) R Vel (m/s) L-R Vel (m/s)  Median Acr Palm Anti Sensory (2nd Digit)  31.7C  Wrist  *4.4   13.0  Wrist Palm     Palm             Radial Anti Sensory (Base 1st Digit)  32C  Wrist  2.4   9.7  Wrist Base 1st Digit     Ulnar Anti Sensory (5th Digit)  31.7C  Wrist  *4.0   17.4  Wrist 5th Digit  *35    Motor Left/Right Comparison   Stim Site L Lat (ms) R Lat (ms) L-R Lat (ms) L Amp (mV) R Amp (mV) L-R Amp (%) Site1 Site2 L Vel (m/s) R Vel (m/s) L-R Vel (m/s)  Median Motor (Abd Poll Brev)  32.1C  Wrist  *4.4   9.1  Elbow Wrist  *48   Elbow  9.1   8.7        Ulnar Motor (Abd Dig Min)  32.2C  Wrist  3.5   4.5  B Elbow Wrist  *52   B Elbow  7.7   7.9  A Elbow B Elbow  56   A Elbow  9.5   7.5           Waveforms:            Clinical History: FINDINGS: 03/31/2016 NERVE CONDUCTION STUDY: Right median motor response has prolonged distal latency, normal amplitude, normal conduction velocity.  Left median motor response has prolonged distal latency, normal amplitude, normal conduction velocity.  Right ulnar motor response and F-wave latency are normal.  Bilateral median sensory responses have prolonged peak latencies and decreased amplitudes.  Right ulnar sensory response is normal.  Bilateral median to ulnar transcarpal comparison studies demonstrate prolonged peak latency difference of the bilateral median responses.   NEEDLE ELECTROMYOGRAPHY: Needle examination of right upper extremity deltoid, biceps, triceps, flexor carpi radialis, first dorsal interosseous and right C6-7 paraspinal muscles is normal.    IMPRESSION:  Abnormal study demonstrating: - Bilateral median neuropathies at the wrist consistent with mild bilateral carpal tunnel syndrome. - No electro-diagnostic evidence of right ulnar neuropathy.      INTERPRETING PHYSICIAN:  Penni Bombard, MD Certified in Neurology, Neurophysiology and Neuroimaging  [facility]   He reports that he quit smoking about 31 years ago. He has never used smokeless tobacco.  Recent Labs    10/17/18 1054  HGBA1C 6.4*    Objective:  VS:  HT:    WT:   BMI:     BP:   HR: bpm  TEMP: ( )  RESP:  Physical Exam Musculoskeletal:        General: No tenderness.     Comments: Inspection reveals no atrophy of the bilateral APB or FDI or hand intrinsics. There is no swelling, color changes, allodynia or dystrophic changes. There is 5 out of 5 strength in the bilateral wrist extension, finger abduction and long finger flexion. There is intact sensation to light touch in all dermatomal and peripheral nerve distributions.  There is a negative Hoffmann's test bilaterally.  Skin:    General: Skin is warm and dry.     Findings: No erythema or rash.  Neurological:     General: No focal deficit present.     Mental Status: He is alert and oriented to person, place, and time.     Sensory: No sensory deficit.     Motor: No weakness or abnormal muscle tone.     Coordination: Coordination normal.     Gait: Gait normal.  Psychiatric:        Mood and Affect: Mood normal.        Behavior: Behavior normal.        Thought Content: Thought content normal.     Ortho Exam Imaging: No results found.  Past Medical/Family/Surgical/Social History: Medications & Allergies reviewed per EMR, new medications updated. Patient Active Problem List   Diagnosis Date Noted  . OAB (overactive bladder) 10/11/2017  . Diabetic nephropathy associated with type 2 diabetes mellitus ([place]) 10/11/2017  . Cataract associated with type 2 diabetes  mellitus ([place]) 12/02/2015  . Onychomycosis of multiple toenails with type 2 diabetes mellitus ([place]) 07/22/2015  . Former smoker, stopped smoking in distant past 07/22/2015  . Diabetes mellitus ([place]) 06/13/2011  . ED (erectile dysfunction) 06/13/2011  . History of prostate cancer 06/13/2011  . Hypertension associated with diabetes ([place]) 06/13/2011  . Hyperlipidemia associated with type 2 diabetes mellitus ([place]) 06/13/2011  . Allergic rhinitis, mild 06/13/2011  . History of asthma 06/13/2011  . Carotid stenosis 06/13/2011   Past Medical History:  Diagnosis Date  . Allergic rhinitis   . Asthma   . Carotid stenosis   . Diabetes mellitus ([place])   . Diverticulosis   . Dyslipidemia   . ED (erectile dysfunction)   . Hemorrhoids   . HTN (hypertension)   . LVH (left ventricular hypertrophy)    on EKG  . Prostate cancer ([place])   . Smoker    former   Family History  Problem Relation Age of Onset  . Heart failure Mother   . Stroke Father    Past Surgical History:  Procedure Laterality Date  . COLONOSCOPY  2007   Dr. Benson Norway  . I&D EXTREMITY Right 04/02/2016   Procedure: IRRIGATION AND DEBRIDEMENT GREAT TOE;  Surgeon: Leandrew Koyanagi, MD;  Location: [facility];  Service: Orthopedics;  Laterality: Right;   Social History   Occupational History  . Not on file  Tobacco Use  . Smoking status: Former Smoker    Quit date: 10/09/1987    Years since quitting: 31.1  . Smokeless tobacco: Never Used  Substance and Sexual Activity  . Alcohol use: No  . Drug use: No  . Sexual activity: Yes

## 2018-11-28 NOTE — Procedures (Signed)
EMG & NCV Findings: Evaluation of the right median motor nerve showed prolonged distal onset latency (4.4 ms) and decreased conduction velocity (Elbow-Wrist, 48 m/s).  The right ulnar motor nerve showed decreased conduction velocity (B Elbow-Wrist, 52 m/s).  The right median (across palm) sensory nerve showed no response (Palm) and prolonged distal peak latency (4.4 ms).  The right ulnar sensory nerve showed prolonged distal peak latency (4.0 ms) and decreased conduction velocity (Wrist-5th Digit, 35 m/s).  All remaining nerves (as indicated in the following tables) were within normal limits.    All examined muscles (as indicated in the following table) showed no evidence of electrical instability.    Impression: The above electrodiagnostic study is ABNORMAL and reveals evidence of a moderate right median nerve entrapment at the wrist (carpal tunnel syndrome) affecting sensory and motor components.  This demonstrates slight worsening from prior electrodiagnostic study by Dr. Leta Baptist in 2018.  Once again this also demonstrates no conduction drop across the elbow of the ulnar nerve.  Very mild sensory nerve slowing at the wrist of the ulnar nerve could actually be related to temperature artifact and is very mild.  There is no significant electrodiagnostic evidence of any other focal nerve entrapment, brachial plexopathy or cervical radiculopathy  Recommendations: 1.  Follow-up with referring physician. 2.  Continue current management of symptoms.  Consider diagnostic median nerve block or carpal tunnel injection.  ___________________________ Laurence Spates FAAPMR Board Certified, American Board of Physical Medicine and Rehabilitation    Nerve Conduction Studies Anti Sensory Summary Table   Stim Site NR Peak (ms) Norm Peak (ms) P-T Amp (V) Norm P-T Amp Site1 Site2 Delta-P (ms) Dist (cm) Vel (m/s) Norm Vel (m/s)  Right Median Acr Palm Anti Sensory (2nd Digit)  31.7C  Wrist    *4.4 <3.6 13.0 >10  Wrist Palm  0.0    Palm *NR  <2.0          Right Radial Anti Sensory (Base 1st Digit)  32C  Wrist    2.4 <3.1 9.7  Wrist Base 1st Digit 2.4 0.0    Right Ulnar Anti Sensory (5th Digit)  31.7C  Wrist    *4.0 <3.7 17.4 >15.0 Wrist 5th Digit 4.0 14.0 *35 >38   Motor Summary Table   Stim Site NR Onset (ms) Norm Onset (ms) O-P Amp (mV) Norm O-P Amp Site1 Site2 Delta-0 (ms) Dist (cm) Vel (m/s) Norm Vel (m/s)  Right Median Motor (Abd Poll Brev)  32.1C  Wrist    *4.4 <4.2 9.1 >5 Elbow Wrist 4.7 22.5 *48 >50  Elbow    9.1  8.7         Right Ulnar Motor (Abd Dig Min)  32.2C  Wrist    3.5 <4.2 4.5 >3 B Elbow Wrist 4.2 22.0 *52 >53  B Elbow    7.7  7.9  A Elbow B Elbow 1.8 10.0 56 >53  A Elbow    9.5  7.5          EMG   Side Muscle Nerve Root Ins Act Fibs Psw Amp Dur Poly Recrt Int Fraser Din Comment  Right Abd Poll Brev Median C8-T1 Nml Nml Nml Nml Nml 0 Nml Nml   Right 1stDorInt Ulnar C8-T1 Nml Nml Nml Nml Nml 0 Nml Nml   Right PronatorTeres Median C6-7 Nml Nml Nml Nml Nml 0 Nml Nml   Right Biceps Musculocut C5-6 Nml Nml Nml Nml Nml 0 Nml Nml   Right Deltoid Axillary C5-6 Nml Nml Nml Nml Nml  0 Nml Nml     Nerve Conduction Studies Anti Sensory Left/Right Comparison   Stim Site L Lat (ms) R Lat (ms) L-R Lat (ms) L Amp (V) R Amp (V) L-R Amp (%) Site1 Site2 L Vel (m/s) R Vel (m/s) L-R Vel (m/s)  Median Acr Palm Anti Sensory (2nd Digit)  31.7C  Wrist  *4.4   13.0  Wrist Palm     Palm             Radial Anti Sensory (Base 1st Digit)  32C  Wrist  2.4   9.7  Wrist Base 1st Digit     Ulnar Anti Sensory (5th Digit)  31.7C  Wrist  *4.0   17.4  Wrist 5th Digit  *35    Motor Left/Right Comparison   Stim Site L Lat (ms) R Lat (ms) L-R Lat (ms) L Amp (mV) R Amp (mV) L-R Amp (%) Site1 Site2 L Vel (m/s) R Vel (m/s) L-R Vel (m/s)  Median Motor (Abd Poll Brev)  32.1C  Wrist  *4.4   9.1  Elbow Wrist  *48   Elbow  9.1   8.7        Ulnar Motor (Abd Dig Min)  32.2C  Wrist  3.5   4.5  B Elbow Wrist   *52   B Elbow  7.7   7.9  A Elbow B Elbow  56   A Elbow  9.5   7.5           Waveforms:

## 2018-11-29 ENCOUNTER — Other Ambulatory Visit: Payer: Self-pay

## 2018-11-29 ENCOUNTER — Ambulatory Visit (INDEPENDENT_AMBULATORY_CARE_PROVIDER_SITE_OTHER): Payer: Medicare HMO | Admitting: Orthopaedic Surgery

## 2018-11-29 ENCOUNTER — Encounter: Payer: Self-pay | Admitting: Physician Assistant

## 2018-11-29 DIAGNOSIS — G5601 Carpal tunnel syndrome, right upper limb: Secondary | ICD-10-CM

## 2018-11-29 MED ORDER — METHYLPREDNISOLONE ACETATE 40 MG/ML IJ SUSP
13.3300 mg | INTRAMUSCULAR | Status: AC | PRN
  Administered 2018-11-29: 13.33 mg

## 2018-11-29 MED ORDER — BUPIVACAINE HCL 0.25 % IJ SOLN
0.3300 mL | INTRAMUSCULAR | Status: AC | PRN
  Administered 2018-11-29: .33 mL

## 2018-11-29 MED ORDER — LIDOCAINE HCL 1 % IJ SOLN
0.3000 mL | INTRAMUSCULAR | Status: AC | PRN
  Administered 2018-11-29: .3 mL

## 2018-11-29 NOTE — Progress Notes (Signed)
Office Visit Note   Patient: Shaun Kennedy           Date of Birth: 02-22-1940           MRN: [id number] Visit Date: 11/29/2018              Requested by: [name], MD [place],  [place] [id number] PCP: [name], MD   Assessment & Plan: Visit Diagnoses:  1. Carpal tunnel syndrome, right upper limb     Plan: Impression is right hand carpal tunnel syndrome with moderate median nerve compression.  The patient is still exhibiting symptoms of ulnar nerve irritation, but we will proceed with diagnostic and hopefully therapeutic cortisone injection to the right carpal tunnel.  He will follow-up with [place] if he does not see any improvement or we will look more into his neck.  Follow-Up Instructions: Return if symptoms worsen or fail to improve.   Orders:  Orders Placed This Encounter  Procedures  . Hand/UE Inj: R carpal tunnel   No orders of the defined types were placed in this encounter.     Procedures: Hand/UE Inj: R carpal tunnel for carpal tunnel syndrome on 11/29/2018 3:19 PM Indications: pain Details: 25 G needle, volar approach Medications: 0.33 mL bupivacaine 0.25 %; 0.3 mL lidocaine 1 %; 13.33 mg methylPREDNISolone acetate 40 MG/ML      Clinical Data: No additional findings.   Subjective: Chief Complaint  Patient presents with  . Right Hand - Pain, Follow-up    HPI patient is a pleasant 78 year old gentleman who presents to our clinic today to discuss nerve conduction studies of the right upper extremity.  He has been dealing with paresthesias primarily to the ulnar nerve distribution which does occasionally radiate into his other fingers.  He has recently undergone nerve conduction study by Dr. Ernestina Patches which showed a moderate median nerve entrapment.  No ulnar nerve findings.  Dr. Ernestina Patches was able to find previous nerve conduction study in the past with similar symptoms of the patient and similar findings to the recent study.  The  patient was offered diagnostic ulnar nerve decompression by Dr. Fredna Dow in the past which was declined.  No previous cortisone injection to the carpal tunnel the patient is aware of.  Review of Systems as detailed in HPI.  All others reviewed and are negative.   Objective: Vital Signs: There were no vitals taken for this visit.  Physical Exam well-developed well-nourished gentleman in no acute distress.  Alert and oriented x3.  Ortho Exam exam of the hand reveals minimally positive Phalen.  Negative Tinel.  Decreased sensation to the ring and small fingers.  Specialty Comments:  No specialty comments available.  Imaging: No new imaging   PMFS History: Patient Active Problem List   Diagnosis Date Noted  . OAB (overactive bladder) 10/11/2017  . Diabetic nephropathy associated with type 2 diabetes mellitus ([place]) 10/11/2017  . Cataract associated with type 2 diabetes mellitus ([facility]) 12/02/2015  . Onychomycosis of multiple toenails with type 2 diabetes mellitus ([place]) 07/22/2015  . Former smoker, stopped smoking in distant past 07/22/2015  . Diabetes mellitus ([place]) 06/13/2011  . ED (erectile dysfunction) 06/13/2011  . History of prostate cancer 06/13/2011  . Hypertension associated with diabetes ([place]) 06/13/2011  . Hyperlipidemia associated with type 2 diabetes mellitus ([place]) 06/13/2011  . Allergic rhinitis, mild 06/13/2011  . History of asthma 06/13/2011  . Carotid stenosis 06/13/2011   Past Medical History:  Diagnosis Date  . Allergic  rhinitis   . Asthma   . Carotid stenosis   . Diabetes mellitus ([place])   . Diverticulosis   . Dyslipidemia   . ED (erectile dysfunction)   . Hemorrhoids   . HTN (hypertension)   . LVH (left ventricular hypertrophy)    on EKG  . Prostate cancer ([facility])   . Smoker    former    Family History  Problem Relation Age of Onset  . Heart failure Mother   . Stroke Father     Past Surgical History:  Procedure Laterality Date  . COLONOSCOPY  2007    Dr. Benson Norway  . I&D EXTREMITY Right 04/02/2016   Procedure: IRRIGATION AND DEBRIDEMENT GREAT TOE;  Surgeon: Leandrew Koyanagi, MD;  Location: [facility];  Service: Orthopedics;  Laterality: Right;   Social History   Occupational History  . Not on file  Tobacco Use  . Smoking status: Former Smoker    Quit date: 10/09/1987    Years since quitting: 31.1  . Smokeless tobacco: Never Used  Substance and Sexual Activity  . Alcohol use: No  . Drug use: No  . Sexual activity: Yes

## 2018-11-30 DIAGNOSIS — E1136 Type 2 diabetes mellitus with diabetic cataract: Secondary | ICD-10-CM | POA: Diagnosis not present

## 2018-11-30 DIAGNOSIS — E1151 Type 2 diabetes mellitus with diabetic peripheral angiopathy without gangrene: Secondary | ICD-10-CM | POA: Diagnosis not present

## 2018-11-30 DIAGNOSIS — N529 Male erectile dysfunction, unspecified: Secondary | ICD-10-CM | POA: Diagnosis not present

## 2018-11-30 DIAGNOSIS — E785 Hyperlipidemia, unspecified: Secondary | ICD-10-CM | POA: Diagnosis not present

## 2018-11-30 DIAGNOSIS — Z008 Encounter for other general examination: Secondary | ICD-10-CM | POA: Diagnosis not present

## 2018-11-30 DIAGNOSIS — N3281 Overactive bladder: Secondary | ICD-10-CM | POA: Diagnosis not present

## 2018-11-30 DIAGNOSIS — I10 Essential (primary) hypertension: Secondary | ICD-10-CM | POA: Diagnosis not present

## 2018-11-30 DIAGNOSIS — E669 Obesity, unspecified: Secondary | ICD-10-CM | POA: Diagnosis not present

## 2018-11-30 DIAGNOSIS — Z6833 Body mass index (BMI) 33.0-33.9, adult: Secondary | ICD-10-CM | POA: Diagnosis not present

## 2018-11-30 DIAGNOSIS — Z89411 Acquired absence of right great toe: Secondary | ICD-10-CM | POA: Diagnosis not present

## 2018-11-30 DIAGNOSIS — Z7722 Contact with and (suspected) exposure to environmental tobacco smoke (acute) (chronic): Secondary | ICD-10-CM | POA: Diagnosis not present

## 2018-12-21 ENCOUNTER — Encounter: Payer: Self-pay | Admitting: Orthopaedic Surgery

## 2018-12-21 ENCOUNTER — Ambulatory Visit: Payer: Medicare HMO | Admitting: Orthopaedic Surgery

## 2018-12-21 ENCOUNTER — Other Ambulatory Visit: Payer: Self-pay

## 2018-12-21 VITALS — Ht 67.0 in | Wt 213.0 lb

## 2018-12-21 DIAGNOSIS — G5601 Carpal tunnel syndrome, right upper limb: Secondary | ICD-10-CM | POA: Diagnosis not present

## 2018-12-21 DIAGNOSIS — G5621 Lesion of ulnar nerve, right upper limb: Secondary | ICD-10-CM

## 2018-12-21 DIAGNOSIS — G5622 Lesion of ulnar nerve, left upper limb: Secondary | ICD-10-CM | POA: Insufficient documentation

## 2018-12-21 DIAGNOSIS — Z9889 Other specified postprocedural states: Secondary | ICD-10-CM | POA: Insufficient documentation

## 2018-12-21 NOTE — Progress Notes (Signed)
Office Visit Note   Patient: Shaun Kennedy           Date of Birth: 07/09/1940           MRN: [id number] Visit Date: 12/21/2018              Requested by: Shaun Lung, MD [address] [place],  [place] [address] PCP: Shaun Lung, MD   Assessment & Plan: Visit Diagnoses:  1. Right carpal tunnel syndrome   2. Cubital tunnel syndrome on right     Plan: My impression is moderate right carpal tunnel syndrome and right cubital tunnel syndrome.  Based on her discussion today patient has elected to proceed with right carpal tunnel and right cubital tunnel release.  Risk benefits alternatives and rehab and recovery were discussed.  Questions encouraged and answered.  Follow-Up Instructions: Return for 1 week postop visit.   Orders:  No orders of the defined types were placed in this encounter.  No orders of the defined types were placed in this encounter.     Procedures: No procedures performed   Clinical Data: No additional findings.   Subjective: Chief Complaint  Patient presents with  . Right Hand - Follow-up    Shaun Kennedy follows up today for right carpal tunnel syndrome as well as suspected right cubital tunnel syndrome.  Recent nerve conduction study showed moderate right carpal tunnel syndrome.  Clinically speaking he states that he is bothered more by the decreased sensation in his ring finger and small finger.  He states that his entire hand will go numb at times.  His last A1c was 6.3.  The previous carpal tunnel injection did help but was temporary.   Review of Systems  Constitutional: Negative.   All other systems reviewed and are negative.    Objective: Vital Signs: Ht 5\' 7"  (1.702 m)   Wt 213 lb (96.6 kg)   BMI 33.36 kg/m   Physical Exam Vitals signs and nursing note reviewed.  Constitutional:      Appearance: He is well-developed.  Pulmonary:     Effort: Pulmonary effort is normal.  Abdominal:     Palpations: Abdomen is soft.  Skin:     General: Skin is warm.  Neurological:     Mental Status: He is alert and oriented to person, place, and time.  Psychiatric:        Behavior: Behavior normal.        Thought Content: Thought content normal.        Judgment: Judgment normal.     Ortho Exam Exam shows a stable ulnar nerve without subluxation.  Mildly positive Tinel at the cubital tunnel. Specialty Comments:  No specialty comments available.  Imaging: No results found.   PMFS History: Patient Active Problem List   Diagnosis Date Noted  . Right carpal tunnel syndrome 12/21/2018  . Cubital tunnel syndrome on right 12/21/2018  . OAB (overactive bladder) 10/11/2017  . Diabetic nephropathy associated with type 2 diabetes mellitus ([facility]) 10/11/2017  . Cataract associated with type 2 diabetes mellitus ([facility]) 12/02/2015  . Onychomycosis of multiple toenails with type 2 diabetes mellitus ([facility]) 07/22/2015  . Former smoker, stopped smoking in distant past 07/22/2015  . Diabetes mellitus ([facility]) 06/13/2011  . ED (erectile dysfunction) 06/13/2011  . History of prostate cancer 06/13/2011  . Hypertension associated with diabetes ([facility]) 06/13/2011  . Hyperlipidemia associated with type 2 diabetes mellitus ([facility]) 06/13/2011  . Allergic rhinitis, mild 06/13/2011  . History of asthma 06/13/2011  .  Carotid stenosis 06/13/2011   Past Medical History:  Diagnosis Date  . Allergic rhinitis   . Asthma   . Carotid stenosis   . Diabetes mellitus ([facility])   . Diverticulosis   . Dyslipidemia   . ED (erectile dysfunction)   . Hemorrhoids   . HTN (hypertension)   . LVH (left ventricular hypertrophy)    on EKG  . Prostate cancer ([facility])   . Smoker    former    Family History  Problem Relation Age of Onset  . Heart failure Mother   . Stroke Father     Past Surgical History:  Procedure Laterality Date  . COLONOSCOPY  2007   Dr. Benson Kennedy  . I&D EXTREMITY Right 04/02/2016   Procedure: IRRIGATION AND DEBRIDEMENT GREAT TOE;  Surgeon:  Shaun Koyanagi, MD;  Location: [facility];  Service: Orthopedics;  Laterality: Right;   Social History   Occupational History  . Not on file  Tobacco Use  . Smoking status: Former Smoker    Quit date: 10/09/1987    Years since quitting: 31.2  . Smokeless tobacco: Never Used  Substance and Sexual Activity  . Alcohol use: No  . Drug use: No  . Sexual activity: Yes

## 2018-12-24 ENCOUNTER — Other Ambulatory Visit: Payer: Self-pay

## 2018-12-27 ENCOUNTER — Encounter (HOSPITAL_BASED_OUTPATIENT_CLINIC_OR_DEPARTMENT_OTHER): Payer: Self-pay | Admitting: *Deleted

## 2018-12-27 ENCOUNTER — Other Ambulatory Visit: Payer: Self-pay

## 2018-12-27 NOTE — Progress Notes (Signed)
Medical and surgical history reviewed by Dr. Lanetta Inch, Anesthesiologist, d/t hx carotid stenosis. Okay to proceed with surgery per Dr. Lanetta Inch.

## 2019-01-02 DIAGNOSIS — R69 Illness, unspecified: Secondary | ICD-10-CM | POA: Diagnosis not present

## 2019-01-07 ENCOUNTER — Other Ambulatory Visit (HOSPITAL_COMMUNITY)
Admission: RE | Admit: 2019-01-07 | Discharge: 2019-01-07 | Disposition: A | Discharge status: Home / Self Care | Payer: Medicare HMO | Source: Ambulatory Visit | Attending: Orthopaedic Surgery | Admitting: Orthopaedic Surgery

## 2019-01-07 DIAGNOSIS — Z20828 Contact with and (suspected) exposure to other viral communicable diseases: Secondary | ICD-10-CM | POA: Insufficient documentation

## 2019-01-07 DIAGNOSIS — Z01812 Encounter for preprocedural laboratory examination: Secondary | ICD-10-CM | POA: Insufficient documentation

## 2019-01-07 LAB — SARS CORONAVIRUS 2 (TAT 6-24 HRS): SARS Coronavirus 2: NEGATIVE

## 2019-01-08 ENCOUNTER — Other Ambulatory Visit: Payer: Self-pay

## 2019-01-08 ENCOUNTER — Encounter (HOSPITAL_BASED_OUTPATIENT_CLINIC_OR_DEPARTMENT_OTHER)
Admission: RE | Admit: 2019-01-08 | Discharge: 2019-01-08 | Disposition: A | Discharge status: Home / Self Care | Payer: Medicare HMO | Source: Ambulatory Visit | Attending: Orthopaedic Surgery | Admitting: Orthopaedic Surgery

## 2019-01-08 DIAGNOSIS — Z87891 Personal history of nicotine dependence: Secondary | ICD-10-CM | POA: Diagnosis not present

## 2019-01-08 DIAGNOSIS — E785 Hyperlipidemia, unspecified: Secondary | ICD-10-CM | POA: Diagnosis not present

## 2019-01-08 DIAGNOSIS — G5601 Carpal tunnel syndrome, right upper limb: Secondary | ICD-10-CM | POA: Diagnosis present

## 2019-01-08 DIAGNOSIS — J45909 Unspecified asthma, uncomplicated: Secondary | ICD-10-CM | POA: Diagnosis not present

## 2019-01-08 DIAGNOSIS — Z8546 Personal history of malignant neoplasm of prostate: Secondary | ICD-10-CM | POA: Diagnosis not present

## 2019-01-08 DIAGNOSIS — G5621 Lesion of ulnar nerve, right upper limb: Secondary | ICD-10-CM | POA: Diagnosis not present

## 2019-01-08 DIAGNOSIS — Z79899 Other long term (current) drug therapy: Secondary | ICD-10-CM | POA: Diagnosis not present

## 2019-01-08 DIAGNOSIS — Z7984 Long term (current) use of oral hypoglycemic drugs: Secondary | ICD-10-CM | POA: Diagnosis not present

## 2019-01-08 DIAGNOSIS — I10 Essential (primary) hypertension: Secondary | ICD-10-CM | POA: Diagnosis not present

## 2019-01-08 DIAGNOSIS — Z7982 Long term (current) use of aspirin: Secondary | ICD-10-CM | POA: Diagnosis not present

## 2019-01-08 DIAGNOSIS — E119 Type 2 diabetes mellitus without complications: Secondary | ICD-10-CM | POA: Diagnosis not present

## 2019-01-08 LAB — BASIC METABOLIC PANEL
Anion gap: 14 (ref 5–15)
BUN: 15 mg/dL (ref 8–23)
CO2: 25 mmol/L (ref 22–32)
Calcium: 9.3 mg/dL (ref 8.9–10.3)
Chloride: 99 mmol/L (ref 98–111)
Creatinine, Ser: 1.24 mg/dL (ref 0.61–1.24)
GFR calc Af Amer: >60 mL/min (ref >60)
GFR calc non Af Amer: 55 mL/min — ABNORMAL LOW (ref >60)
Glucose, Bld: 137 mg/dL — ABNORMAL HIGH (ref 70–99)
Potassium: 4.1 mmol/L (ref 3.5–5.1)
Sodium: 138 mmol/L (ref 135–145)

## 2019-01-08 NOTE — Progress Notes (Signed)

## 2019-01-09 ENCOUNTER — Other Ambulatory Visit: Payer: Self-pay

## 2019-01-09 ENCOUNTER — Ambulatory Visit (HOSPITAL_BASED_OUTPATIENT_CLINIC_OR_DEPARTMENT_OTHER)
Admission: RE | Admit: 2019-01-09 | Discharge: 2019-01-09 | Disposition: A | Discharge status: Home / Self Care | Payer: Medicare HMO | Attending: Orthopaedic Surgery | Admitting: Orthopaedic Surgery

## 2019-01-09 ENCOUNTER — Ambulatory Visit (HOSPITAL_BASED_OUTPATIENT_CLINIC_OR_DEPARTMENT_OTHER): Payer: Medicare HMO | Admitting: Anesthesiology

## 2019-01-09 ENCOUNTER — Encounter (HOSPITAL_BASED_OUTPATIENT_CLINIC_OR_DEPARTMENT_OTHER): Payer: Self-pay | Admitting: *Deleted

## 2019-01-09 ENCOUNTER — Encounter (HOSPITAL_BASED_OUTPATIENT_CLINIC_OR_DEPARTMENT_OTHER)
Admission: RE | Disposition: A | Discharge status: Home / Self Care | Payer: Self-pay | Source: Home / Self Care | Attending: Orthopaedic Surgery

## 2019-01-09 DIAGNOSIS — E119 Type 2 diabetes mellitus without complications: Secondary | ICD-10-CM | POA: Insufficient documentation

## 2019-01-09 DIAGNOSIS — G5622 Lesion of ulnar nerve, left upper limb: Secondary | ICD-10-CM | POA: Diagnosis present

## 2019-01-09 DIAGNOSIS — G5601 Carpal tunnel syndrome, right upper limb: Secondary | ICD-10-CM

## 2019-01-09 DIAGNOSIS — Z7984 Long term (current) use of oral hypoglycemic drugs: Secondary | ICD-10-CM | POA: Insufficient documentation

## 2019-01-09 DIAGNOSIS — J45909 Unspecified asthma, uncomplicated: Secondary | ICD-10-CM | POA: Insufficient documentation

## 2019-01-09 DIAGNOSIS — E785 Hyperlipidemia, unspecified: Secondary | ICD-10-CM | POA: Insufficient documentation

## 2019-01-09 DIAGNOSIS — Z7982 Long term (current) use of aspirin: Secondary | ICD-10-CM | POA: Insufficient documentation

## 2019-01-09 DIAGNOSIS — I10 Essential (primary) hypertension: Secondary | ICD-10-CM | POA: Insufficient documentation

## 2019-01-09 DIAGNOSIS — Z8546 Personal history of malignant neoplasm of prostate: Secondary | ICD-10-CM | POA: Insufficient documentation

## 2019-01-09 DIAGNOSIS — G5621 Lesion of ulnar nerve, right upper limb: Secondary | ICD-10-CM | POA: Insufficient documentation

## 2019-01-09 DIAGNOSIS — Z9889 Other specified postprocedural states: Secondary | ICD-10-CM | POA: Diagnosis present

## 2019-01-09 DIAGNOSIS — Z87891 Personal history of nicotine dependence: Secondary | ICD-10-CM | POA: Insufficient documentation

## 2019-01-09 DIAGNOSIS — Z79899 Other long term (current) drug therapy: Secondary | ICD-10-CM | POA: Diagnosis not present

## 2019-01-09 HISTORY — PX: ULNAR TUNNEL RELEASE: SHX820

## 2019-01-09 HISTORY — DX: Personal history of other diseases of the musculoskeletal system and connective tissue: Z87.39

## 2019-01-09 HISTORY — PX: CARPAL TUNNEL RELEASE: SHX101

## 2019-01-09 LAB — GLUCOSE, CAPILLARY
Glucose-Capillary: 97 mg/dL (ref 70–99)
Glucose-Capillary: 97 mg/dL (ref 70–99)

## 2019-01-09 SURGERY — RELEASE, CUBITAL TUNNEL
Anesthesia: Regional | Site: Wrist | Laterality: Right

## 2019-01-09 MED ORDER — CHLORHEXIDINE GLUCONATE 4 % EX LIQD: 60.0000 mL | Freq: Once | CUTANEOUS | Status: DC

## 2019-01-09 MED ORDER — LACTATED RINGERS IV SOLN
INTRAVENOUS | Status: DC
  Administered 2019-01-09: 08:00:00 via INTRAVENOUS

## 2019-01-09 MED ORDER — MIDAZOLAM HCL 2 MG/2ML IJ SOLN
INTRAMUSCULAR | Status: AC
  Filled 2019-01-09: qty 2

## 2019-01-09 MED ORDER — ONDANSETRON HCL 4 MG PO TABS: 4.0000 mg | ORAL_TABLET | Freq: Three times a day (TID) | ORAL | 0 refills | Status: DC | PRN

## 2019-01-09 MED ORDER — PROPOFOL 500 MG/50ML IV EMUL
INTRAVENOUS | Status: DC | PRN
  Administered 2019-01-09: 25 ug/kg/min via INTRAVENOUS

## 2019-01-09 MED ORDER — HYDROCODONE-ACETAMINOPHEN 7.5-325 MG PO TABS: 1.0000 | ORAL_TABLET | Freq: Three times a day (TID) | ORAL | 0 refills | Status: DC | PRN

## 2019-01-09 MED ORDER — SODIUM CHLORIDE (PF) 0.9 % IJ SOLN
INTRAMUSCULAR | Status: AC
  Filled 2019-01-09: qty 10

## 2019-01-09 MED ORDER — ONDANSETRON HCL 4 MG/2ML IJ SOLN
INTRAMUSCULAR | Status: DC | PRN
  Administered 2019-01-09: 4 mg via INTRAVENOUS

## 2019-01-09 MED ORDER — FENTANYL CITRATE (PF) 100 MCG/2ML IJ SOLN
50.0000 ug | INTRAMUSCULAR | Status: DC | PRN
  Administered 2019-01-09: 100 ug via INTRAVENOUS

## 2019-01-09 MED ORDER — GLYCOPYRROLATE PF 0.2 MG/ML IJ SOSY
PREFILLED_SYRINGE | INTRAMUSCULAR | Status: AC
  Filled 2019-01-09: qty 1

## 2019-01-09 MED ORDER — PROPOFOL 10 MG/ML IV BOLUS
INTRAVENOUS | Status: AC
  Filled 2019-01-09: qty 20

## 2019-01-09 MED ORDER — HYDROMORPHONE HCL 1 MG/ML IJ SOLN: 0.2500 mg | INTRAMUSCULAR | Status: DC | PRN

## 2019-01-09 MED ORDER — BUPIVACAINE-EPINEPHRINE (PF) 0.5% -1:200000 IJ SOLN
INTRAMUSCULAR | Status: DC | PRN
  Administered 2019-01-09: 30 mL via PERINEURAL

## 2019-01-09 MED ORDER — CEFAZOLIN SODIUM 1 G IJ SOLR
INTRAMUSCULAR | Status: AC
  Filled 2019-01-09: qty 20

## 2019-01-09 MED ORDER — MIDAZOLAM HCL 2 MG/2ML IJ SOLN
1.0000 mg | INTRAMUSCULAR | Status: DC | PRN
  Administered 2019-01-09: 2 mg via INTRAVENOUS

## 2019-01-09 MED ORDER — LIDOCAINE 2% (20 MG/ML) 5 ML SYRINGE
INTRAMUSCULAR | Status: AC
  Filled 2019-01-09: qty 5

## 2019-01-09 MED ORDER — ONDANSETRON HCL 4 MG/2ML IJ SOLN
INTRAMUSCULAR | Status: AC
  Filled 2019-01-09: qty 2

## 2019-01-09 MED ORDER — ONDANSETRON HCL 4 MG/2ML IJ SOLN: 4.0000 mg | Freq: Once | INTRAMUSCULAR | Status: DC | PRN

## 2019-01-09 MED ORDER — FENTANYL CITRATE (PF) 100 MCG/2ML IJ SOLN
INTRAMUSCULAR | Status: AC
  Filled 2019-01-09: qty 2

## 2019-01-09 MED ORDER — CEFAZOLIN SODIUM-DEXTROSE 2-4 GM/100ML-% IV SOLN
2.0000 g | INTRAVENOUS | Status: AC
  Administered 2019-01-09: 2 g via INTRAVENOUS

## 2019-01-09 MED ORDER — MEPERIDINE HCL 25 MG/ML IJ SOLN: 6.2500 mg | INTRAMUSCULAR | Status: DC | PRN

## 2019-01-09 SURGICAL SUPPLY — 73 items
BAND INSRT 18 STRL LF DISP RB (MISCELLANEOUS) ×4
BAND RUBBER #18 3X1/16 STRL (MISCELLANEOUS) ×6 IMPLANT
BLADE MINI RND TIP GREEN BEAV (BLADE) ×3 IMPLANT
BLADE SURG 15 STRL LF DISP TIS (BLADE) ×2 IMPLANT
BLADE SURG 15 STRL SS (BLADE) ×3
BNDG CMPR 9X4 STRL LF SNTH (GAUZE/BANDAGES/DRESSINGS) ×2
BNDG ELASTIC 3X5.8 VLCR STR LF (GAUZE/BANDAGES/DRESSINGS) ×4 IMPLANT
BNDG ESMARK 4X9 LF (GAUZE/BANDAGES/DRESSINGS) ×3 IMPLANT
BNDG GAUZE ELAST 4 BULKY (GAUZE/BANDAGES/DRESSINGS) IMPLANT
BNDG PLASTER X FAST 3X3 WHT LF (CAST SUPPLIES) IMPLANT
BNDG PLSTR 9X3 FST ST WHT (CAST SUPPLIES)
BRUSH SCRUB EZ PLAIN DRY (MISCELLANEOUS) ×3 IMPLANT
CANISTER SUCT 1200ML W/VALVE (MISCELLANEOUS) ×3 IMPLANT
CORD BIPOLAR FORCEPS 12FT (ELECTRODE) ×3 IMPLANT
COVER BACK TABLE REUSABLE LG (DRAPES) ×3 IMPLANT
COVER MAYO STAND REUSABLE (DRAPES) ×3 IMPLANT
COVER WAND RF STERILE (DRAPES) IMPLANT
CUFF TOURN SGL QUICK 18X3 (MISCELLANEOUS) ×4 IMPLANT
CUFF TOURN SGL QUICK 18X4 (TOURNIQUET CUFF) ×1 IMPLANT
DECANTER SPIKE VIAL GLASS SM (MISCELLANEOUS) IMPLANT
DRAPE EXTREMITY T 121X128X90 (DISPOSABLE) ×3 IMPLANT
DRAPE IMP U-DRAPE 54X76 (DRAPES) ×3 IMPLANT
DRAPE SURG 17X23 STRL (DRAPES) ×3 IMPLANT
DRSG PAD ABDOMINAL 8X10 ST (GAUZE/BANDAGES/DRESSINGS) ×1 IMPLANT
GAUZE 4X4 16PLY RFD (DISPOSABLE) IMPLANT
GAUZE SPONGE 4X4 12PLY STRL (GAUZE/BANDAGES/DRESSINGS) ×4 IMPLANT
GAUZE XEROFORM 1X8 LF (GAUZE/BANDAGES/DRESSINGS) ×3 IMPLANT
GLOVE BIO SURGEON STRL SZ7 (GLOVE) ×1 IMPLANT
GLOVE BIOGEL PI IND STRL 7.0 (GLOVE) ×2 IMPLANT
GLOVE BIOGEL PI IND STRL 7.5 (GLOVE) IMPLANT
GLOVE BIOGEL PI INDICATOR 7.0 (GLOVE) ×1
GLOVE BIOGEL PI INDICATOR 7.5 (GLOVE) ×1
GLOVE ECLIPSE 7.0 STRL STRAW (GLOVE) ×3 IMPLANT
GLOVE SKINSENSE NS SZ7.5 (GLOVE) ×1
GLOVE SKINSENSE STRL SZ7.5 (GLOVE) ×2 IMPLANT
GLOVE SURG SYN 7.5  E (GLOVE) ×1
GLOVE SURG SYN 7.5 E (GLOVE) ×2 IMPLANT
GLOVE SURG SYN 7.5 PF PI (GLOVE) ×2 IMPLANT
GOWN STRL REIN XL XLG (GOWN DISPOSABLE) ×3 IMPLANT
GOWN STRL REUS W/ TWL LRG LVL3 (GOWN DISPOSABLE) ×2 IMPLANT
GOWN STRL REUS W/ TWL XL LVL3 (GOWN DISPOSABLE) ×4 IMPLANT
GOWN STRL REUS W/TWL LRG LVL3 (GOWN DISPOSABLE) ×3
GOWN STRL REUS W/TWL XL LVL3 (GOWN DISPOSABLE) ×6
LOOP VESSEL MAXI BLUE (MISCELLANEOUS) ×3 IMPLANT
NDL HYPO 25X1 1.5 SAFETY (NEEDLE) ×2 IMPLANT
NEEDLE HYPO 25X1 1.5 SAFETY (NEEDLE) IMPLANT
NS IRRIG 1000ML POUR BTL (IV SOLUTION) ×3 IMPLANT
PACK BASIN DAY SURGERY FS (CUSTOM PROCEDURE TRAY) ×3 IMPLANT
PAD CAST 3X4 CTTN HI CHSV (CAST SUPPLIES) ×2 IMPLANT
PADDING CAST ABS 4INX4YD NS (CAST SUPPLIES)
PADDING CAST ABS COTTON 4X4 ST (CAST SUPPLIES) IMPLANT
PADDING CAST COTTON 3X4 STRL (CAST SUPPLIES) ×6
SLEEVE SCD COMPRESS KNEE MED (MISCELLANEOUS) ×3 IMPLANT
SLING ARM FOAM STRAP LRG (SOFTGOODS) ×3 IMPLANT
STOCKINETTE 4X48 STRL (DRAPES) ×3 IMPLANT
STRIP CLOSURE SKIN 1/4X4 (GAUZE/BANDAGES/DRESSINGS) IMPLANT
SUCTION FRAZIER HANDLE 10FR (MISCELLANEOUS) ×1
SUCTION TUBE FRAZIER 10FR DISP (MISCELLANEOUS) IMPLANT
SUT ETHILON 3 0 PS 1 (SUTURE) ×3 IMPLANT
SUT ETHILON 4 0 PS 2 18 (SUTURE) ×4 IMPLANT
SUT VIC AB 2-0 CT1 27 (SUTURE)
SUT VIC AB 2-0 CT1 TAPERPNT 27 (SUTURE) IMPLANT
SUT VIC AB 3-0 SH 27 (SUTURE) ×3
SUT VIC AB 3-0 SH 27X BRD (SUTURE) IMPLANT
SUT VIC AB 4-0 P-3 18XBRD (SUTURE) IMPLANT
SUT VIC AB 4-0 P2 18 (SUTURE) IMPLANT
SUT VIC AB 4-0 P3 18 (SUTURE)
SYR BULB 3OZ (MISCELLANEOUS) ×3 IMPLANT
SYR CONTROL 10ML LL (SYRINGE) ×2 IMPLANT
TOWEL GREEN STERILE FF (TOWEL DISPOSABLE) ×6 IMPLANT
TRAY DSU PREP LF (CUSTOM PROCEDURE TRAY) ×3 IMPLANT
TUBE CONNECTING 20X1/4 (TUBING) ×1 IMPLANT
UNDERPAD 30X36 HEAVY ABSORB (UNDERPADS AND DIAPERS) ×3 IMPLANT

## 2019-01-09 NOTE — Transfer of Care (Signed)
Immediate Anesthesia Transfer of Care Note  Patient: Shaun Kennedy  Procedure(s) Performed: RIGHT CUBITAL TUNNEL RELEASE AND CARPAL TUNNEL RELEASE (Right Elbow) CARPAL TUNNEL RELEASE (Right Wrist)  Patient Location: PACU  Anesthesia Type:MAC combined with regional for post-op pain  Level of Consciousness: awake and patient cooperative  Airway & Oxygen Therapy: Patient Spontanous Breathing and Patient connected to face mask oxygen  Post-op Assessment: Report given to RN and Post -op Vital signs reviewed and stable  Post vital signs: Reviewed and stable  Last Vitals:  Vitals Value Taken Time  BP    Temp    Pulse 63 01/09/19 0944  Resp 19 01/09/19 0944  SpO2 98 % 01/09/19 0944  Vitals shown include unvalidated device data.  Last Pain:  Vitals:   01/09/19 0738  TempSrc: Oral  PainSc: 0-No pain         Complications: No apparent anesthesia complications

## 2019-01-09 NOTE — Anesthesia Preprocedure Evaluation (Signed)
Anesthesia Evaluation  Patient identified by MRN, date of birth, ID band Patient awake    Reviewed: Allergy & Precautions, NPO status , Patient's Chart, lab work & pertinent test results  Airway Mallampati: II  TM Distance: >3 FB Neck ROM: Full    Dental   Pulmonary former smoker,    Pulmonary exam normal        Cardiovascular hypertension, Pt. on medications Normal cardiovascular exam     Neuro/Psych    GI/Hepatic   Endo/Other  diabetes, Type 2, Oral Hypoglycemic Agents  Renal/GU      Musculoskeletal   Abdominal   Peds  Hematology   Anesthesia Other Findings   Reproductive/Obstetrics                             Anesthesia Physical Anesthesia Plan  ASA: II  Anesthesia Plan: Regional   Post-op Pain Management:    Induction: Intravenous  PONV Risk Score and Plan: 1 and Treatment may vary due to age or medical condition  Airway Management Planned: Simple Face Mask  Additional Equipment:   Intra-op Plan:   Post-operative Plan:   Informed Consent: I have reviewed the patients History and Physical, chart, labs and discussed the procedure including the risks, benefits and alternatives for the proposed anesthesia with the patient or authorized representative who has indicated his/her understanding and acceptance.       Plan Discussed with: CRNA and Surgeon  Anesthesia Plan Comments:         Anesthesia Quick Evaluation

## 2019-01-09 NOTE — Anesthesia Procedure Notes (Signed)
Anesthesia Regional Block: Supraclavicular block   Pre-Anesthetic Checklist: ,, timeout performed, Correct Patient, Correct Site, Correct Laterality, Correct Procedure, Correct Position, site marked, Risks and benefits discussed,  Surgical consent,  Pre-op evaluation,  At surgeon's request and post-op pain management  Laterality: Right  Prep: chloraprep       Needles:   Needle Type: Echogenic Stimulator Needle     Needle Length: 9cm  Needle Gauge: 21     Additional Needles:   Procedures:, nerve stimulator,,,,,,,   Nerve Stimulator or Paresthesia:  Response: 0.4 mA,   Additional Responses:   Narrative:  Start time: 01/09/2019 8:10 AM End time: 01/09/2019 8:20 AM Injection made incrementally with aspirations every 5 mL.  Performed by: Personally  Anesthesiologist: Lillia Abed, MD  Additional Notes: Monitors applied. Patient sedated. Sterile prep and drape,hand hygiene and sterile gloves were used. Relevant anatomy identified.Needle position confirmed.Local anesthetic injected incrementally after negative aspiration. Local anesthetic spread visualized around nerve(s). Vascular puncture avoided. No complications. Image printed for medical record.The patient tolerated the procedure well.

## 2019-01-09 NOTE — H&P (Signed)
PREOPERATIVE H&P  Chief Complaint: right carpal tunnel syndrome, right cubital tunnel syndrome  HPI: Shaun Kennedy is a 78 y.o. male who presents for surgical treatment of right carpal tunnel syndrome, right cubital tunnel syndrome.  He denies any changes in medical history.  Past Medical History:  Diagnosis Date  . Allergic rhinitis   . Asthma   . Carotid stenosis   . Diabetes mellitus ([place])   . Diverticulosis   . Dyslipidemia   . ED (erectile dysfunction)   . H/O degenerative disc disease   . Hemorrhoids   . HTN (hypertension)   . LVH (left ventricular hypertrophy)    on EKG  . Prostate cancer ([facility])   . Smoker    former   Past Surgical History:  Procedure Laterality Date  . COLONOSCOPY  2007   Dr. Benson Norway  . I&D EXTREMITY Right 04/02/2016   Procedure: IRRIGATION AND DEBRIDEMENT GREAT TOE;  Surgeon: Leandrew Koyanagi, MD;  Location: [facility];  Service: Orthopedics;  Laterality: Right;   Social History   Socioeconomic History  . Marital status: Married    Spouse name: Not on file  . Number of children: Not on file  . Years of education: Not on file  . Highest education level: Not on file  Occupational History  . Not on file  Social Needs  . Financial resource strain: Not on file  . Food insecurity    Worry: Not on file    Inability: Not on file  . Transportation needs    Medical: Not on file    Non-medical: Not on file  Tobacco Use  . Smoking status: Former Smoker    Quit date: 10/09/1987    Years since quitting: 31.2  . Smokeless tobacco: Never Used  Substance and Sexual Activity  . Alcohol use: No  . Drug use: No  . Sexual activity: Yes  Lifestyle  . Physical activity    Days per week: Not on file    Minutes per session: Not on file  . Stress: Not on file  Relationships  . Social Herbalist on phone: Not on file    Gets together: Not on file    Attends religious service: Not on file    Active member of club or organization: Not on file   Attends meetings of clubs or organizations: Not on file    Relationship status: Not on file  Other Topics Concern  . Not on file  Social History Narrative   Married, no pets   Family History  Problem Relation Age of Onset  . Heart failure Mother   . Stroke Father    No Known Allergies Prior to Admission medications   Medication Sig Start Date End Date Taking? Authorizing Provider  amLODipine (NORVASC) 5 MG tablet Take 1 tablet (5 mg total) by mouth daily. 10/17/18  Yes [name], MD  aspirin EC 81 MG tablet Take 81 mg by mouth daily.   Yes [provider]  cholecalciferol (VITAMIN D) 1000 units tablet Take 1,000 Units by mouth daily.   Yes [provider]  cilostazol (PLETAL) 100 MG tablet Take 1 tablet (100 mg total) by mouth 2 (two) times daily before a meal. 10/17/18  Yes [name], MD  diclofenac (VOLTAREN) 75 MG EC tablet 1 po bid prn 10/30/18  Yes Aundra Dubin, PA-C  lisinopril-hydrochlorothiazide (ZESTORETIC) 20-12.5 MG tablet Take 1 tablet by mouth daily. 10/17/18  Yes [name], MD  metFORMIN (GLUCOPHAGE-XR) 500 MG 24 hr tablet 1 pill daily 10/17/18  Yes [name], MD  Multiple Vitamin (MULTIVITAMIN WITH MINERALS) TABS tablet Take 1 tablet by mouth daily.   Yes [provider]  predniSONE (STERAPRED UNI-PAK 21 TAB) 5 MG (21) TBPK tablet Take as directed 10/18/18  Yes Aundra Dubin, PA-C  simvastatin (ZOCOR) 40 MG tablet Take 1 tablet (40 mg total) by mouth daily. 10/17/18  Yes [name], MD  solifenacin (VESICARE) 5 MG tablet Take 1 tablet (5 mg total) by mouth daily. 10/17/18  Yes [name], MD  Blood Glucose Monitoring Suppl [facility] VERIO) w/Device KIT USE TO CHECK SUGAR DAILY 02/21/17   [name], MD  Blood Glucose Monitoring Suppl [facility] VERIO) w/Device KIT USE TO CHECK SUGAR DAILY 03/27/18   [name], MD  HYDROcodone-acetaminophen (NORCO) 7.5-325 MG tablet Take 1-2 tablets by mouth 3 (three) times daily  as needed for moderate pain. 01/09/19   Leandrew Koyanagi, MD  LANCETS MICRO THIN 33G [place] Patient is to test two times a day DX: E11.9 (Onetouch verio flex) 03/27/18   [name], MD  ondansetron (ZOFRAN) 4 MG tablet Take 1-2 tablets (4-8 mg total) by mouth every 8 (eight) hours as needed for nausea or vomiting. 01/09/19   Leandrew Koyanagi, MD  [facility] VERIO test strip USE AS INSTRUCTED TO CHECK TWICE DAILY 09/17/18   [name], MD     Positive ROS: All other systems have been reviewed and were otherwise negative with the exception of those mentioned in the HPI and as above.  Physical Exam: General: Alert, no acute distress Cardiovascular: No pedal edema Respiratory: No cyanosis, no use of accessory musculature GI: abdomen soft Skin: No lesions in the area of chief complaint Neurologic: Sensation intact distally Psychiatric: Patient is competent for consent with normal mood and affect Lymphatic: no lymphedema  MUSCULOSKELETAL: exam stable  Assessment: right carpal tunnel syndrome, right cubital tunnel syndrome  Plan: Plan for Procedure(s): RIGHT CUBITAL TUNNEL RELEASE AND CARPAL TUNNEL RELEASE CARPAL TUNNEL RELEASE  The risks benefits and alternatives were discussed with the patient including but not limited to the risks of nonoperative treatment, versus surgical intervention including infection, bleeding, nerve injury,  blood clots, cardiopulmonary complications, morbidity, mortality, among others, and they were willing to proceed.   Eduard Roux, MD   01/09/2019 8:28 AM

## 2019-01-09 NOTE — Op Note (Signed)
   DATE OF SURGERY:01/09/2019  PREOPERATIVE DIAGNOSIS:   1. Right carpal tunnel syndrome 2. Right cubital tunnel syndrome  POSTOPERATIVE DIAGNOSIS: same  PROCEDURE: 1. Right carpal tunnel release. CPT 64721 2. Right cubital tunnel release. CPT [phone]  SURGEON: Surgeon(s): Leandrew Koyanagi, MD  ASSIST: Madalyn Rob, PA-C; necessary for the timely completion of procedure and due to complexity of procedure.  ANESTHESIA:  Regional  TOURNIQUET TIME: less than 30 minutes  BLOOD LOSS: Minimal.  COMPLICATIONS: None.  PATHOLOGY: None.  INDICATIONS: The patient is a 78 y.o. -year-old male who presented with carpal tunnel and cubital tunnel syndrome failing nonsurgical management, indicated for surgical release.  DESCRIPTION OF PROCEDURE: The patient was identified in the preoperative holding area.  The operative site was marked by the surgeon and confirmed by the patient.  He was brought back to the operating room.  Anesthesia was induced by the anesthesia team.  A well padded nonsterile tourniquet was placed. The operative extremity was prepped and draped in standard sterile fashion.  A timeout was performed.  Preoperative antibiotics were given.   A medial elbow incision was made in between the medial epicondyle and olecranon. The medial antebrachial cutaneous nerve was exposed and retracted and protected. The ulnar nerve was identified in between the medial intermuscular septum and medial head of triceps. The fascia crossing the medial head of the triceps and intermuscular septum was released about 10 cm proximally to the elbow. Distally, Osborne's ligament was released from its posterior edge. We followed the ulnar nerve distally. The fascia of the flexor carpi ulnaris was divided and deep aponeurosis was also released. Passive motion of the elbow was performed showing no ulnar nerve subluxation.  We then turned our attention to the carpal tunnel release.  A palmar incision was made  about 5 mm ulnar to the thenar crease.  The palmar aponeurosis was exposed and divided in line with the skin incision. The palmaris brevis was visualized and divided.  The distal edge of the transcarpal ligament was identified. A hemostat was inserted into the carpal tunnel to protect the median nerve and the flexor tendons. Then, the transverse carpal ligament was released under direct visualization. Proximally, a subcutaneous tunnel was made allowing a Sewell retractor to be placed. Then, the distal portion of the antebrachial fascia was released. Distally, all fibrous bands were released. The median nerve was visualized, and the fat pad was exposed. The tourniquet was deflated. Hemostasis achieved in both surgical sites.  Wounds were irrigated and closed with 3- vicryl and 4-0 nylon sutures. Sterile dressing applied. The patient was transferred to the recovery room in stable condition after all counts were correct.  POSTOPERATIVE PLAN: To start nerve gliding exercises as tolerated and no heavy lifting for four weeks.  [name], MD [facility] 9:23 AM

## 2019-01-09 NOTE — Discharge Instructions (Signed)
Postoperative instructions:  Weightbearing instructions: as tolerated.  No lifting more than 10 lbs.  Dressing instructions: Keep your dressing and/or splint clean and dry at all times.  It will be removed at your first post-operative appointment.  Your stitches and/or staples will be removed at this visit.  Incision instructions:  Do not soak your incision for 3 weeks after surgery.  If the incision gets wet, pat dry and do not scrub the incision.  Pain control:  You have been given a prescription to be taken as directed for post-operative pain control.  In addition, elevate the operative extremity above the heart at all times to prevent swelling and throbbing pain.  Take over-the-counter Colace, 100mg  by mouth twice a day while taking narcotic pain medications to help prevent constipation.  Follow up appointments: 1) 7 days for wound check.   -------------------------------------------------------------------------------------------------------------  After Surgery Pain Control:  After your surgery, post-surgical discomfort or pain is likely. This discomfort can last several days to a few weeks. At certain times of the day your discomfort may be more intense.  Did you receive a nerve block?  A nerve block can provide pain relief for one hour to two days after your surgery. As long as the nerve block is working, you will experience little or no sensation in the area the surgeon operated on.  As the nerve block wears off, you will begin to experience pain or discomfort. It is very important that you begin taking your prescribed pain medication before the nerve block fully wears off. Treating your pain at the first sign of the block wearing off will ensure your pain is better controlled and more tolerable when full-sensation returns. Do not wait until the pain is intolerable, as the medicine will be less effective. It is better to treat pain in advance than to try and catch up.  General  Anesthesia:  If you did not receive a nerve block during your surgery, you will need to start taking your pain medication shortly after your surgery and should continue to do so as prescribed by your surgeon.  Pain Medication:  Most commonly we prescribe Vicodin and Percocet for post-operative pain. Both of these medications contain a combination of acetaminophen (Tylenol) and a narcotic to help control pain.   It takes between 30 and 45 minutes before pain medication starts to work. It is important to take your medication before your pain level gets too intense.   Nausea is a common side effect of many pain medications. You will want to eat something before taking your pain medicine to help prevent nausea.   If you are taking a prescription pain medication that contains acetaminophen, we recommend that you do not take additional over the counter acetaminophen (Tylenol).  Other pain relieving options:   Using a cold pack to ice the affected area a few times a day (15 to 20 minutes at a time) can help to relieve pain, reduce swelling and bruising.   Elevation of the affected area can also help to reduce pain and swelling.     Post Anesthesia Home Care Instructions  Activity: Get plenty of rest for the remainder of the day. A responsible individual must stay with you for 24 hours following the procedure.  For the next 24 hours, DO NOT: -Drive a car -Paediatric nurse -Drink alcoholic beverages -Take any medication unless instructed by your physician -Make any legal decisions or sign important papers.  Meals: Start with liquid foods such as gelatin or  soup. Progress to regular foods as tolerated. Avoid greasy, spicy, heavy foods. If nausea and/or vomiting occur, drink only clear liquids until the nausea and/or vomiting subsides. Call your physician if vomiting continues.  Special Instructions/Symptoms: Your throat may feel dry or sore from the anesthesia or the breathing tube placed  in your throat during surgery. If this causes discomfort, gargle with warm salt water. The discomfort should disappear within 24 hours.  If you had a scopolamine patch placed behind your ear for the management of post- operative nausea and/or vomiting:  1. The medication in the patch is effective for 72 hours, after which it should be removed.  Wrap patch in a tissue and discard in the trash. Wash hands thoroughly with soap and water. 2. You may remove the patch earlier than 72 hours if you experience unpleasant side effects which may include dry mouth, dizziness or visual disturbances. 3. Avoid touching the patch. Wash your hands with soap and water after contact with the patch.       Regional Anesthesia Blocks  1. Numbness or the inability to move the "blocked" extremity may last from 3-48 hours after placement. The length of time depends on the medication injected and your individual response to the medication. If the numbness is not going away after 48 hours, call your surgeon.  2. The extremity that is blocked will need to be protected until the numbness is gone and the  Strength has returned. Because you cannot feel it, you will need to take extra care to avoid injury. Because it may be weak, you may have difficulty moving it or using it. You may not know what position it is in without looking at it while the block is in effect.  3. For blocks in the legs and feet, returning to weight bearing and walking needs to be done carefully. You will need to wait until the numbness is entirely gone and the strength has returned. You should be able to move your leg and foot normally before you try and bear weight or walk. You will need someone to be with you when you first try to ensure you do not fall and possibly risk injury.  4. Bruising and tenderness at the needle site are common side effects and will resolve in a few days.  5. Persistent numbness or new problems with movement should be  communicated to the surgeon or the [facility] [phone] [place] [phone]).

## 2019-01-09 NOTE — Progress Notes (Signed)
Assisted Dr. Conrad Chebanse with right, ultrasound guided, supraclavicular block. Side rails up, monitors on throughout procedure. See vital signs in flow sheet. Tolerated Procedure well.

## 2019-01-09 NOTE — Anesthesia Postprocedure Evaluation (Signed)
Anesthesia Post Note  Patient: Shaun Kennedy  Procedure(s) Performed: RIGHT CUBITAL TUNNEL RELEASE AND CARPAL TUNNEL RELEASE (Right Elbow) CARPAL TUNNEL RELEASE (Right Wrist)     Patient location during evaluation: PACU Anesthesia Type: Regional Level of consciousness: awake and alert and patient cooperative Pain management: pain level controlled Vital Signs Assessment: post-procedure vital signs reviewed and stable Respiratory status: spontaneous breathing and respiratory function stable Cardiovascular status: stable Anesthetic complications: no    Last Vitals:  Vitals:   01/09/19 0945 01/09/19 1027  BP:  (!) 149/58  Pulse: (!) 59 60  Resp: 16 18  Temp:  36.8 C  SpO2: 97% 95%    Last Pain:  Vitals:   01/09/19 1027  TempSrc:   PainSc: 0-No pain                 Laikyn Gewirtz DAVID

## 2019-01-10 ENCOUNTER — Encounter (HOSPITAL_BASED_OUTPATIENT_CLINIC_OR_DEPARTMENT_OTHER): Payer: Self-pay | Admitting: Orthopaedic Surgery

## 2019-01-16 ENCOUNTER — Other Ambulatory Visit: Payer: Self-pay

## 2019-01-16 ENCOUNTER — Encounter: Payer: Self-pay | Admitting: Orthopaedic Surgery

## 2019-01-16 ENCOUNTER — Ambulatory Visit (INDEPENDENT_AMBULATORY_CARE_PROVIDER_SITE_OTHER): Payer: Medicare HMO | Admitting: Orthopaedic Surgery

## 2019-01-16 DIAGNOSIS — G5621 Lesion of ulnar nerve, right upper limb: Secondary | ICD-10-CM

## 2019-01-16 DIAGNOSIS — G5601 Carpal tunnel syndrome, right upper limb: Secondary | ICD-10-CM

## 2019-01-16 NOTE — Progress Notes (Signed)
Patient ID: Shaun Kennedy, male   DOB: [birth year], 78 y.o.   MRN: [id number]  [name] is 1 week status post left carpal tunnel release and right cubital tunnel.  He is doing well overall.  He endorses some soreness in his elbow region.  No constitutional symptoms.  Incisions are clean dry and intact sutures are intact.  He is neurovascularly intact.  He has some bruising and soreness proximal to the cubital tunnel incision.  He has good elbow motion.  Sterile dressings were applied today.  He may increase activity as tolerated.  No more than 10 pounds of lifting.  Recheck next week for suture removal.  We placed him in a carpal tunnel brace today.

## 2019-01-21 ENCOUNTER — Encounter (HOSPITAL_COMMUNITY): Payer: Medicare HMO

## 2019-01-21 ENCOUNTER — Ambulatory Visit: Payer: Medicare HMO | Admitting: Surgery

## 2019-01-22 ENCOUNTER — Other Ambulatory Visit: Payer: Self-pay

## 2019-01-22 ENCOUNTER — Encounter: Payer: Self-pay | Admitting: Orthopaedic Surgery

## 2019-01-22 ENCOUNTER — Ambulatory Visit (INDEPENDENT_AMBULATORY_CARE_PROVIDER_SITE_OTHER): Payer: Medicare HMO | Admitting: Physician Assistant

## 2019-01-22 DIAGNOSIS — G5602 Carpal tunnel syndrome, left upper limb: Secondary | ICD-10-CM

## 2019-01-22 DIAGNOSIS — G5622 Lesion of ulnar nerve, left upper limb: Secondary | ICD-10-CM

## 2019-01-22 NOTE — Progress Notes (Signed)
Post-Op Visit Note   Patient: Shaun Kennedy           Date of Birth: [birth year]           MRN: [id number] Visit Date: 01/22/2019 PCP: [name], MD   Assessment & Plan:  Chief Complaint:  Chief Complaint  Patient presents with  . Right Elbow - Follow-up  . Left Hand - Follow-up   Visit Diagnoses:  1. Carpal tunnel syndrome, left upper limb   2. Cubital tunnel syndrome on left     Plan: Patient is a pleasant 78 year old gentleman who presents our clinic today 2 weeks status post right cubital and carpal tunnel release.  He has been doing fairly well.  He has minimal to no pain to the right wrist.  He does have fair amount of soreness to his elbow.  No fevers or chills.  Examination of his right elbow reveals well-healing surgical incision with nylon sutures in place.  Does have a small hematoma tender to the proximal aspect.  Right wrist shows a well-healing surgical incision with nylon sutures in place.  At this point, nylon sutures were removed and Steri-Strips applied.  In regards to his elbow, he will apply heat as needed for the hematoma.  For the wrist, he will wear his removable brace as needed for the next few weeks.  To avoid any lifting greater than 10 pounds for another 2 weeks.  No submerging either incision in water for another 2 weeks.  Follow-up with [place] in 4 weeks time for recheck.  Call with concerns or questions.  Follow-Up Instructions: Return in about 4 weeks (around 02/19/2019).   Orders:  No orders of the defined types were placed in this encounter.  No orders of the defined types were placed in this encounter.   Imaging: No new imaging  PMFS History: Patient Active Problem List   Diagnosis Date Noted  . Right carpal tunnel syndrome 12/21/2018  . Cubital tunnel syndrome on right 12/21/2018  . OAB (overactive bladder) 10/11/2017  . Diabetic nephropathy associated with type 2 diabetes mellitus ([place]) 10/11/2017  . Cataract associated with type 2  diabetes mellitus ([place]) 12/02/2015  . Onychomycosis of multiple toenails with type 2 diabetes mellitus ([place]) 07/22/2015  . Former smoker, stopped smoking in distant past 07/22/2015  . Diabetes mellitus ([place]) 06/13/2011  . ED (erectile dysfunction) 06/13/2011  . History of prostate cancer 06/13/2011  . Hypertension associated with diabetes ([place]) 06/13/2011  . Hyperlipidemia associated with type 2 diabetes mellitus ([place]) 06/13/2011  . Allergic rhinitis, mild 06/13/2011  . History of asthma 06/13/2011  . Carotid stenosis 06/13/2011   Past Medical History:  Diagnosis Date  . Allergic rhinitis   . Asthma   . Carotid stenosis   . Diabetes mellitus ([place])   . Diverticulosis   . Dyslipidemia   . ED (erectile dysfunction)   . H/O degenerative disc disease   . Hemorrhoids   . HTN (hypertension)   . LVH (left ventricular hypertrophy)    on EKG  . Prostate cancer ([place])   . Smoker    former    Family History  Problem Relation Age of Onset  . Heart failure Mother   . Stroke Father     Past Surgical History:  Procedure Laterality Date  . CARPAL TUNNEL RELEASE Right 01/09/2019   Procedure: CARPAL TUNNEL RELEASE;  Surgeon: Leandrew Koyanagi, MD;  Location: [facility];  Service: Orthopedics;  Laterality: Right;  . COLONOSCOPY  2007   Dr. Benson Norway  . I&D EXTREMITY Right 04/02/2016   Procedure: IRRIGATION AND DEBRIDEMENT GREAT TOE;  Surgeon: Leandrew Koyanagi, MD;  Location: [facility];  Service: Orthopedics;  Laterality: Right;  . ULNAR TUNNEL RELEASE Right 01/09/2019   Procedure: RIGHT CUBITAL TUNNEL RELEASE AND CARPAL TUNNEL RELEASE;  Surgeon: Leandrew Koyanagi, MD;  Location: [facility];  Service: Orthopedics;  Laterality: Right;   Social History   Occupational History  . Not on file  Tobacco Use  . Smoking status: Former Smoker    Quit date: 10/09/1987    Years since quitting: 31.3  . Smokeless tobacco: Never Used  Substance and Sexual Activity  . Alcohol use: No  . Drug  use: No  . Sexual activity: Yes

## 2019-01-24 DIAGNOSIS — H18893 Other specified disorders of cornea, bilateral: Secondary | ICD-10-CM | POA: Diagnosis not present

## 2019-01-24 DIAGNOSIS — Z961 Presence of intraocular lens: Secondary | ICD-10-CM | POA: Diagnosis not present

## 2019-01-24 DIAGNOSIS — E119 Type 2 diabetes mellitus without complications: Secondary | ICD-10-CM | POA: Diagnosis not present

## 2019-01-24 DIAGNOSIS — H40013 Open angle with borderline findings, low risk, bilateral: Secondary | ICD-10-CM | POA: Diagnosis not present

## 2019-01-24 LAB — HM DIABETES EYE EXAM

## 2019-02-11 ENCOUNTER — Other Ambulatory Visit: Payer: Self-pay

## 2019-02-11 ENCOUNTER — Ambulatory Visit (INDEPENDENT_AMBULATORY_CARE_PROVIDER_SITE_OTHER)
Admission: RE | Admit: 2019-02-11 | Discharge: 2019-02-11 | Disposition: A | Discharge status: Home / Self Care | Payer: Medicare HMO | Source: Ambulatory Visit | Attending: Surgery | Admitting: Surgery

## 2019-02-11 ENCOUNTER — Encounter: Payer: Self-pay | Admitting: Surgery

## 2019-02-11 ENCOUNTER — Ambulatory Visit (HOSPITAL_COMMUNITY)
Admission: RE | Admit: 2019-02-11 | Discharge: 2019-02-11 | Disposition: A | Discharge status: Home / Self Care | Payer: Medicare HMO | Source: Ambulatory Visit | Attending: Surgery | Admitting: Surgery

## 2019-02-11 ENCOUNTER — Ambulatory Visit (INDEPENDENT_AMBULATORY_CARE_PROVIDER_SITE_OTHER): Payer: Medicare HMO | Admitting: Surgery

## 2019-02-11 ENCOUNTER — Other Ambulatory Visit: Payer: Self-pay | Admitting: *Deleted

## 2019-02-11 VITALS — BP 148/75 | HR 80 | Temp 97.9°F | Resp 20 | Ht 67.0 in | Wt 197.7 lb

## 2019-02-11 DIAGNOSIS — I6522 Occlusion and stenosis of left carotid artery: Secondary | ICD-10-CM

## 2019-02-11 DIAGNOSIS — I70212 Atherosclerosis of native arteries of extremities with intermittent claudication, left leg: Secondary | ICD-10-CM | POA: Insufficient documentation

## 2019-02-11 DIAGNOSIS — I6523 Occlusion and stenosis of bilateral carotid arteries: Secondary | ICD-10-CM | POA: Diagnosis not present

## 2019-02-11 NOTE — Progress Notes (Signed)
[facility]  Patient name: Shaun Kennedy MRN: [id number] DOB: December 01, 1940 Sex: male   REASON FOR VISIT:    Follow up  HISOTRY OF PRESENT ILLNESS:    Shaun Kennedy is a 79 y.o. male who I initially saw in November 2019 for evaluation of carotid and lower extremity vascular disease.  He has never had a stroke and has not had any neurologic symptoms.  With regards to his legs, he was walking 3 miles approximately 3 times a week.  His symptoms would occur at approximately 1/2 mile and go away with rest.  We contemplated starting cilostazol however he wanted to verify this with Dr. Redmond School before starting it because he thinks he may have been on it in the past.  He is currently not taking it, and is doing very well.  Patient suffers from diabetes.  This is relatively well-controlled with a most recent hemoglobin A1c of 6.5.  He is on a statin for hypercholesterolemia.  He takes an ACE inhibitor for hypertension.  He has a former smoker.  PAST MEDICAL HISTORY:   Past Medical History:  Diagnosis Date  . Allergic rhinitis   . Asthma   . Carotid stenosis   . Diabetes mellitus ([place])   . Diverticulosis   . Dyslipidemia   . ED (erectile dysfunction)   . H/O degenerative disc disease   . Hemorrhoids   . HTN (hypertension)   . LVH (left ventricular hypertrophy)    on EKG  . Prostate cancer ([place])   . Smoker    former     FAMILY HISTORY:   Family History  Problem Relation Age of Onset  . Heart failure Mother   . Stroke Father     SOCIAL HISTORY:   Social History   Tobacco Use  . Smoking status: Former Smoker    Quit date: 10/09/1987    Years since quitting: 31.3  . Smokeless tobacco: Never Used  Substance Use Topics  . Alcohol use: No     ALLERGIES:   No Known Allergies   CURRENT MEDICATIONS:   Current Outpatient Medications  Medication Sig Dispense Refill  . amLODipine (NORVASC) 5 MG tablet Take  1 tablet (5 mg total) by mouth daily. 90 tablet 3  . aspirin EC 81 MG tablet Take 81 mg by mouth daily.    . Blood Glucose Monitoring Suppl (ONETOUCH VERIO) w/Device KIT USE TO CHECK SUGAR DAILY 1 kit 0  . Blood Glucose Monitoring Suppl (ONETOUCH VERIO) w/Device KIT USE TO CHECK SUGAR DAILY 1 kit 0  . cholecalciferol (VITAMIN D) 1000 units tablet Take 1,000 Units by mouth daily.    . cilostazol (PLETAL) 100 MG tablet Take 1 tablet (100 mg total) by mouth 2 (two) times daily before a meal. 180 tablet 3  . diclofenac (VOLTAREN) 75 MG EC tablet 1 po bid prn 30 tablet 0  . HYDROcodone-acetaminophen (NORCO) 7.5-325 MG tablet Take 1-2 tablets by mouth 3 (three) times daily as needed for moderate pain. 30 tablet 0  . LANCETS MICRO THIN 33G MISC Patient is to test two times a day DX: E11.9 (Onetouch verio flex) 100 each 12  . lisinopril-hydrochlorothiazide (ZESTORETIC) 20-12.5 MG tablet Take 1 tablet by mouth daily. 90 tablet 2  . metFORMIN (GLUCOPHAGE-XR) 500 MG 24 hr tablet 1 pill daily 90 tablet 1  . Multiple Vitamin (MULTIVITAMIN WITH MINERALS) TABS tablet Take 1 tablet by mouth daily.    . ondansetron (ZOFRAN) 4 MG tablet Take  1-2 tablets (4-8 mg total) by mouth every 8 (eight) hours as needed for nausea or vomiting. 40 tablet 0  . ONETOUCH VERIO test strip USE AS INSTRUCTED TO CHECK TWICE DAILY 200 strip 5  . simvastatin (ZOCOR) 40 MG tablet Take 1 tablet (40 mg total) by mouth daily. 90 tablet 3  . solifenacin (VESICARE) 5 MG tablet Take 1 tablet (5 mg total) by mouth daily. 90 tablet 3   No current facility-administered medications for this visit.    REVIEW OF SYSTEMS:   '[X]'  denotes positive finding, '[ ]'  denotes negative finding Cardiac  Comments:  Chest pain or chest pressure:    Shortness of breath upon exertion:    Short of breath when lying flat:    Irregular heart rhythm:        Vascular    Pain in calf, thigh, or hip brought on by ambulation: x   Pain in feet at night that wakes  you up from your sleep:     Blood clot in your veins:    Leg swelling:         Pulmonary    Oxygen at home:    Productive cough:     Wheezing:         Neurologic    Sudden weakness in arms or legs:     Sudden numbness in arms or legs:     Sudden onset of difficulty speaking or slurred speech:    Temporary loss of vision in one eye:     Problems with dizziness:         Gastrointestinal    Blood in stool:     Vomited blood:         Genitourinary    Burning when urinating:     Blood in urine:        Psychiatric    Major depression:         Hematologic    Bleeding problems:    Problems with blood clotting too easily:        Skin    Rashes or ulcers:        Constitutional    Fever or chills:      PHYSICAL EXAM:   Vitals:   02/11/19 1412  BP: (!) 148/75  Pulse: 80  Resp: 20  Temp: 97.9 F (36.6 C)  SpO2: 99%  Weight: 197 lb 11.2 oz (89.7 kg)  Height: '5\' 7"'  (1.702 m)    GENERAL: The patient is a well-nourished male, in no acute distress. The vital signs are documented above. CARDIAC: There is a regular rate and rhythm.  PULMONARY: Non-labored respirations MUSCULOSKELETAL: There are no major deformities or cyanosis. NEUROLOGIC: No focal weakness or paresthesias are detected. SKIN: There are no ulcers or rashes noted. PSYCHIATRIC: The patient has a normal affect.  STUDIES:   I have reviewed the following: Right Carotid: Velocities in the right ICA are consistent with a 1-39% stenosis.                Non-hemodynamically significant plaque <50% noted in the CCA. The                ECA appears <50% stenosed.  Left Carotid: Velocities in the left ICA are consistent with a 1-39% stenosis.               Non-hemodynamically significant plaque <50% noted in the CCA. The               ECA appears <  50% stenosed. Unable to duplicate elevated velocities               as noted on prior exam.  Vertebrals:  Bilateral vertebral arteries demonstrate antegrade flow.  Right              systolic deceleration noted. Subclavians: Bilateral subclavian artery flow was disturbed.   ABI/TBIToday's ABIToday's TBIPrevious ABIPrevious TBI +-------+-----------+-----------+------------+------------+ Right  0.97       0.73       0.93        0.68         +-------+-----------+-----------+------------+------------+ Left   0.74       0.63       0.81        0.63         +-------+-----------+-----------+------------+------------+   MEDICAL ISSUES:   Carotid: No significant atherosclerotic carotid disease in a asymptomatic patient.  I would recommend following up carotid Doppler studies in 1 year, given that the left side was consistent with a 40-59% stenosis last year  Lower extremity vascular disease: The patient's ABIs have increased since his last visit.  He is currently without symptoms.  I will have him follow-up in 1 year with repeat ABIs.  He knows to contact me if he develops a change in symptoms or a nonhealing wound.    Leia Alf, MD, FACS [facility] [phone] Pager [phone]

## 2019-02-18 ENCOUNTER — Other Ambulatory Visit: Payer: Self-pay

## 2019-02-18 ENCOUNTER — Ambulatory Visit (INDEPENDENT_AMBULATORY_CARE_PROVIDER_SITE_OTHER): Payer: Medicare HMO | Admitting: Family Medicine

## 2019-02-18 ENCOUNTER — Encounter: Payer: Self-pay | Admitting: Family Medicine

## 2019-02-18 VITALS — BP 148/70 | HR 90 | Temp 96.8°F | Wt 199.6 lb

## 2019-02-18 DIAGNOSIS — N3281 Overactive bladder: Secondary | ICD-10-CM | POA: Diagnosis not present

## 2019-02-18 DIAGNOSIS — I739 Peripheral vascular disease, unspecified: Secondary | ICD-10-CM | POA: Diagnosis not present

## 2019-02-18 DIAGNOSIS — E785 Hyperlipidemia, unspecified: Secondary | ICD-10-CM | POA: Diagnosis not present

## 2019-02-18 DIAGNOSIS — E119 Type 2 diabetes mellitus without complications: Secondary | ICD-10-CM | POA: Diagnosis not present

## 2019-02-18 DIAGNOSIS — I6522 Occlusion and stenosis of left carotid artery: Secondary | ICD-10-CM

## 2019-02-18 DIAGNOSIS — Z9889 Other specified postprocedural states: Secondary | ICD-10-CM

## 2019-02-18 DIAGNOSIS — I152 Hypertension secondary to endocrine disorders: Secondary | ICD-10-CM

## 2019-02-18 DIAGNOSIS — E1159 Type 2 diabetes mellitus with other circulatory complications: Secondary | ICD-10-CM

## 2019-02-18 DIAGNOSIS — E1169 Type 2 diabetes mellitus with other specified complication: Secondary | ICD-10-CM

## 2019-02-18 DIAGNOSIS — I10 Essential (primary) hypertension: Secondary | ICD-10-CM | POA: Diagnosis not present

## 2019-02-18 LAB — POCT GLYCOSYLATED HEMOGLOBIN (HGB A1C): Hemoglobin A1C: 6.6 % — AB (ref 4.0–5.6)

## 2019-02-18 MED ORDER — SOLIFENACIN SUCCINATE 10 MG PO TABS: 10.0000 mg | ORAL_TABLET | Freq: Every day | ORAL | 5 refills | Status: DC

## 2019-02-18 NOTE — Progress Notes (Signed)
  Subjective:    Patient ID: Shaun Kennedy, male    DOB: 1940-12-03, 79 y.o.   MRN: [id number]  Shaun Kennedy is a 79 y.o. male who presents for follow-up of Type 2 diabetes mellitus.  Home blood sugar records: meter record 99-137 fasting  Current symptoms/problems include none at this time. Daily foot checks: yes   Any foot concerns:none at this time Exercise: walking Diet: improving He was recently seen by cardiovascular for evaluation of carotid disease as well as PVD.  Those notes were reviewed.  He was also recently seen by orthopedics and had CTS as well as an ulnar nerve surgery.  He continues on Pletal.  He is also taking amlodipine and lisinopril/HCTZ and having no difficulty with those medications.  He takes 500 mg of Metformin has no complaints.  He states that the Vesicare for his OAB has not been as beneficial as it was when he started. The following portions of the patient's history were reviewed and updated as appropriate: allergies, current medications, past medical history, past social history and problem list.  ROS as in subjective above.     Objective:    Physical Exam Alert and in no distress otherwise not examined.   Lab Review Diabetic Labs Latest Ref Rng & Units 01/08/2019 10/17/2018 10/11/2017 05/10/2017 04/24/2017  HbA1c 4.0 - 5.6 % - 6.4(A) 6.5(A) 6.2 6.2  Microalbumin mg/L - 129.6 >300.00 - -  Micro/Creat Ratio - - 71.4 >262.0 - -  Chol 100 - 199 mg/dL - 146 173 - -  HDL >39 mg/dL - 73 88 - -  Calc LDL 0 - 99 mg/dL - 57 70 - -  Triglycerides 0 - 149 mg/dL - 87 75 - -  Creatinine 0.61 - 1.24 mg/dL 1.24 1.24 1.20 - -   BP/Weight 02/11/2019 01/09/2019 12/21/2018 3/55/9741 07/11/8451  Systolic BP 646 803 - - 212  Diastolic BP 75 58 - - 96  Wt. (Lbs) 197.7 202.38 213 209 208.2  BMI 30.96 31.7 33.36 31.78 32.61   Foot/eye exam completion dates Latest Ref Rng & Units 01/24/2019 01/22/2018  Eye Exam No Retinopathy No Retinopathy Retinopathy(A)  Foot Form Completion - -  -  Hemoglobin A1c is 6.6  Shaun Kennedy  reports that he quit smoking about 31 years ago. He has never used smokeless tobacco. He reports that he does not drink alcohol or use drugs.     Assessment & Plan:    Type 2 diabetes mellitus without complication, without long-term current use of insulin (HCC) - Plan: HgB A1c  Stenosis of left carotid artery  Hyperlipidemia associated with type 2 diabetes mellitus (HCC)  OAB (overactive bladder)  History of carpal tunnel surgery of right wrist  Hypertension associated with diabetes ([place])  Peripheral vascular disease of lower extremity ([employer])   1. Rx changes: Vesicare 10 mg   2. Education: Reviewed 'ABCs' of diabetes management (respective goals in parentheses):  A1C (<7), blood pressure (<130/80), and cholesterol (LDL <100). 3. Compliance at present is estimated to be good. Efforts to improve compliance (if necessary) will be directed at increased exercise.  And also physical therapy for his right arm. 4. Follow up: 6 months Discussed increased dose of Vesicare and possible side effects. Also discussed getting the Covid vaccine and will help him get that arranged.

## 2019-02-19 ENCOUNTER — Ambulatory Visit: Payer: Medicare HMO | Admitting: Orthopaedic Surgery

## 2019-02-19 DIAGNOSIS — G5622 Lesion of ulnar nerve, left upper limb: Secondary | ICD-10-CM

## 2019-02-19 DIAGNOSIS — G5602 Carpal tunnel syndrome, left upper limb: Secondary | ICD-10-CM | POA: Insufficient documentation

## 2019-02-19 NOTE — Progress Notes (Signed)
Patient ID: REAKWON BARREN, male   DOB: Jul 11, 1940, 79 y.o.   MRN: [id number]  Shaun Kennedy is 6 weeks status post right carpal tunnel release and right cubital tunnel release.  He is doing very well.  He just has a little bit of pillar pain on the radial side of his carpal tunnel incision.  He is neurovascularly intact.  Scars are fully healed.  At this point we will release him to activity as tolerated.  He has done very well and he has already noticed significant improvement since his surgeries.  Questions encouraged and answered.  Follow-up as needed.

## 2019-03-21 ENCOUNTER — Ambulatory Visit: Payer: Medicare HMO | Admitting: Family Medicine

## 2019-03-21 ENCOUNTER — Other Ambulatory Visit: Payer: Self-pay

## 2019-03-21 ENCOUNTER — Ambulatory Visit: Payer: Medicare HMO | Attending: Internal Medicine

## 2019-03-21 DIAGNOSIS — Z20822 Contact with and (suspected) exposure to covid-19: Secondary | ICD-10-CM | POA: Diagnosis not present

## 2019-03-23 LAB — NOVEL CORONAVIRUS, NAA: SARS-CoV-2, NAA: NOT DETECTED

## 2019-04-02 ENCOUNTER — Encounter: Payer: Self-pay | Admitting: Family Medicine

## 2019-04-02 ENCOUNTER — Encounter: Payer: Self-pay | Admitting: Gastroenterology

## 2019-04-02 ENCOUNTER — Other Ambulatory Visit: Payer: Self-pay

## 2019-04-02 ENCOUNTER — Ambulatory Visit (INDEPENDENT_AMBULATORY_CARE_PROVIDER_SITE_OTHER): Payer: Medicare HMO | Admitting: Family Medicine

## 2019-04-02 VITALS — BP 158/72 | HR 82 | Temp 98.2°F | Wt 198.8 lb

## 2019-04-02 DIAGNOSIS — I77811 Abdominal aortic ectasia: Secondary | ICD-10-CM

## 2019-04-02 DIAGNOSIS — R1310 Dysphagia, unspecified: Secondary | ICD-10-CM | POA: Diagnosis not present

## 2019-04-02 DIAGNOSIS — Z87891 Personal history of nicotine dependence: Secondary | ICD-10-CM | POA: Diagnosis not present

## 2019-04-02 DIAGNOSIS — R1319 Other dysphagia: Secondary | ICD-10-CM

## 2019-04-02 DIAGNOSIS — R6881 Early satiety: Secondary | ICD-10-CM

## 2019-04-02 LAB — CBC WITH DIFFERENTIAL/PLATELET
Basophils Absolute: 0 10*3/uL (ref 0.0–0.2)
Basos: 0 %
EOS (ABSOLUTE): 0.3 10*3/uL (ref 0.0–0.4)
Eos: 4 %
Hematocrit: 32.6 % — ABNORMAL LOW (ref 37.5–51.0)
Hemoglobin: 11.2 g/dL — ABNORMAL LOW (ref 13.0–17.7)
Immature Grans (Abs): 0 10*3/uL (ref 0.0–0.1)
Immature Granulocytes: 0 %
Lymphocytes Absolute: 1.9 10*3/uL (ref 0.7–3.1)
Lymphs: 27 %
MCH: 30.3 pg (ref 26.6–33.0)
MCHC: 34.4 g/dL (ref 31.5–35.7)
MCV: 88 fL (ref 79–97)
Monocytes Absolute: 0.6 10*3/uL (ref 0.1–0.9)
Monocytes: 9 %
Neutrophils Absolute: 4.3 10*3/uL (ref 1.4–7.0)
Neutrophils: 60 %
Platelets: 435 10*3/uL (ref 150–450)
RBC: 3.7 x10E6/uL — ABNORMAL LOW (ref 4.14–5.80)
RDW: 12.1 % (ref 11.6–15.4)
WBC: 7.1 10*3/uL (ref 3.4–10.8)

## 2019-04-02 LAB — COMPREHENSIVE METABOLIC PANEL
ALT: 12 IU/L (ref 0–44)
AST: 13 IU/L (ref 0–40)
Albumin/Globulin Ratio: 1 — ABNORMAL LOW (ref 1.2–2.2)
Albumin: 4 g/dL (ref 3.7–4.7)
Alkaline Phosphatase: 65 IU/L (ref 39–117)
BUN/Creatinine Ratio: 10 (ref 10–24)
BUN: 13 mg/dL (ref 8–27)
Bilirubin Total: 0.5 mg/dL (ref 0.0–1.2)
CO2: 23 mmol/L (ref 20–29)
Calcium: 10.1 mg/dL (ref 8.6–10.2)
Chloride: 100 mmol/L (ref 96–106)
Creatinine, Ser: 1.3 mg/dL — ABNORMAL HIGH (ref 0.76–1.27)
GFR calc Af Amer: 60 mL/min/{1.73_m2} (ref >59)
GFR calc non Af Amer: 52 mL/min/{1.73_m2} — ABNORMAL LOW (ref >59)
Globulin, Total: 4.2 g/dL (ref 1.5–4.5)
Glucose: 104 mg/dL — ABNORMAL HIGH (ref 65–99)
Potassium: 4.6 mmol/L (ref 3.5–5.2)
Sodium: 138 mmol/L (ref 134–144)
Total Protein: 8.2 g/dL (ref 6.0–8.5)

## 2019-04-02 NOTE — Progress Notes (Signed)
   Subjective:    Patient ID: Shaun Kennedy, male    DOB: 1940/03/03, 79 y.o.   MRN: [id number]  HPI He is here for consult concerning a 76-month history of feeling as if food getting stuck in his mid esophageal area, early satiety and weight loss.  He states that he can have 2 bites of food and does not want to eat anymore although he does state that he can eat breakfast without difficulty.  He states that he can wash the food down after he gets stuck with water.  He also complains of some constipation that has not responded to MiraLAX.  No nausea, vomiting, abdominal pain.   Review of Systems     Objective:   Physical Exam Alert and in no distress. Tympanic membranes and canals are normal. Pharyngeal area is normal. Neck is supple without adenopathy or thyromegaly. Cardiac exam shows a regular sinus rhythm without murmurs or gallops. Lungs are clear to auscultation.  Abdominal exam shows decreased bowel sounds without masses or tenderness. Review of his record indicates only minimal weight loss.     Assessment & Plan:  Early satiety - Plan: CBC with Differential/Platelet, Comprehensive metabolic panel, Ambulatory referral to Gastroenterology  Esophageal dysphagia - Plan: Ambulatory referral to Gastroenterology  Ectatic abdominal aorta ([place]) - Plan: US AORTA  Former smoker, stopped smoking in distant past He did have an ultrasound in 2016 which did show an ectatic aorta and recommend repeating in 5 years which I will do.  We will also refer to GI for further evaluation of the dysphagia/early satiety.

## 2019-04-03 ENCOUNTER — Telehealth: Payer: Self-pay | Admitting: Family Medicine

## 2019-04-03 NOTE — Telephone Encounter (Signed)
Pt was advised Shaun Kennedy 

## 2019-04-03 NOTE — Telephone Encounter (Signed)
He needs to be seen sooner by somebody.  Get more information about the pain

## 2019-04-03 NOTE — Telephone Encounter (Signed)
Pt called and said he has a scheduled appt with [facility] in 5 weeks but was wondering if he could get something sooner because he is still having pain.

## 2019-04-03 NOTE — Telephone Encounter (Signed)
Have him take a double dose of Prilosec to see if that will help.

## 2019-04-03 NOTE — Telephone Encounter (Signed)
[name] was called and pt appt was moved up to 04-11-19 at three pm. Pt was advised. He stated pain is located at the bottom of his ribs. Pt says when he take acid reducing medicine (tums) it give pt a little relief for a short period of time then pain returns. Please advise [facility]

## 2019-04-05 ENCOUNTER — Ambulatory Visit: Payer: Medicare HMO | Attending: Internal Medicine

## 2019-04-05 DIAGNOSIS — Z23 Encounter for immunization: Secondary | ICD-10-CM

## 2019-04-05 NOTE — Progress Notes (Signed)
   Covid-19 Vaccination Clinic  Name:  Shaun Kennedy    MRN: [id number] DOB: [birth year]  04/05/2019  [name] was observed post Covid-19 immunization for 15 minutes without incidence. He was provided with Vaccine Information Sheet and instruction to access the V-Safe system.   [name] was instructed to call 911 with any severe reactions post vaccine: Marland Kitchen Difficulty breathing  . Swelling of your face and throat  . A fast heartbeat  . A bad rash all over your body  . Dizziness and weakness    Immunizations Administered    Name Date Dose VIS Date Route   Pfizer COVID-19 Vaccine 04/05/2019  2:02 PM 0.3 mL 01/18/2019 Intramuscular   Manufacturer: [place]   Lot: [id number]   [place]: [address]-8

## 2019-04-09 ENCOUNTER — Ambulatory Visit
Admission: RE | Admit: 2019-04-09 | Discharge: 2019-04-09 | Disposition: A | Discharge status: Home / Self Care | Payer: Medicare HMO | Source: Ambulatory Visit | Attending: Family Medicine | Admitting: Family Medicine

## 2019-04-09 DIAGNOSIS — I77811 Abdominal aortic ectasia: Secondary | ICD-10-CM | POA: Diagnosis not present

## 2019-04-09 DIAGNOSIS — I700 Atherosclerosis of aorta: Secondary | ICD-10-CM | POA: Insufficient documentation

## 2019-04-11 ENCOUNTER — Ambulatory Visit: Payer: Medicare HMO | Admitting: Nurse Practitioner

## 2019-04-11 ENCOUNTER — Encounter: Payer: Self-pay | Admitting: Nurse Practitioner

## 2019-04-11 ENCOUNTER — Telehealth: Payer: Self-pay

## 2019-04-11 ENCOUNTER — Other Ambulatory Visit: Payer: Self-pay

## 2019-04-11 VITALS — BP 140/60 | HR 92 | Temp 98.7°F | Ht 67.5 in | Wt 189.0 lb

## 2019-04-11 DIAGNOSIS — K5909 Other constipation: Secondary | ICD-10-CM

## 2019-04-11 DIAGNOSIS — R69 Illness, unspecified: Secondary | ICD-10-CM | POA: Diagnosis not present

## 2019-04-11 DIAGNOSIS — R634 Abnormal weight loss: Secondary | ICD-10-CM | POA: Diagnosis not present

## 2019-04-11 DIAGNOSIS — R1310 Dysphagia, unspecified: Secondary | ICD-10-CM

## 2019-04-11 MED ORDER — LINACLOTIDE 72 MCG PO CAPS: 72.0000 ug | ORAL_CAPSULE | Freq: Every day | ORAL | 3 refills | Status: DC

## 2019-04-11 NOTE — Progress Notes (Signed)
Referring Provider: Jill Alexanders, MD  Reason for Referral : dysphagia, early satiety              ASSESSMENT / PLAN:   Shaun Kennedy is a 79 y.o. male with a pmh significant for, but not necessarily limited to, ? CKD, carotid artery disease, hypertension, hyperlipidemia, diabetes, former smoker   # Solid food dysphagia. -Needs EGD to rule out esophageal stricture / mass, especially with weight loss. The risks and benefits of EGD were discussed and the patient agrees to proceed.   # 18 pounds weight loss / early satiety since September 2020 --Further evaluation at time of EGD  # Chronic constipation --Drinking ~ 60 oz daily.  --Eats fiber bars --Miralax stopped working.  --Trial of Linzess 72 mcg daily on empty stomach. If not covered by insurance he will let us know at which point we can try increasing miralax.  # [place] anemia, chronic. Hgb 11.2 --No overt GI bleeding --Negative Cologuard July 2018   HPI:     Chief Complaint:  Constipated, having problems swallowing   ** History comes from chart and chart   This patient is an 79 year old male, new to the practice here for evalulation of dysphagia and early satiety.    Shaun Kennedy gives a one month history of solid food dysphagia. Feels like breads / meat gets stuck in distal chest. Drinking water helps food pass. No odynophagia. No history of GERD. He reports unintentional weight loss of 40 pounds since September. By Epic numbers he is down 18 pounds.  Attributes weight loss to poor appetite, still doesn't have an appetite and is quick to get full. No N/V. No abdominal pain.  Md also struggles with constipation. Daily Miralax worked for a few months but then lost efficacy. He hasn' had a BM in 9 days. No blood in stool  Data Reviewed:  04/02/19 Cr 1.3 hgb 11.2, MCV 88 ( baseline hgb 12-13)   Past Medical History:  Diagnosis Date  . Allergic rhinitis   . Asthma   . Carotid stenosis   . Diabetes mellitus ([place])   .  Diverticulosis   . Dyslipidemia   . ED (erectile dysfunction)   . H/O degenerative disc disease   . Hemorrhoids   . HTN (hypertension)   . LVH (left ventricular hypertrophy)    on EKG  . Prostate cancer ([place])   . Smoker    former     Past Surgical History:  Procedure Laterality Date  . CARPAL TUNNEL RELEASE Right 01/09/2019   Procedure: CARPAL TUNNEL RELEASE;  Surgeon: Leandrew Koyanagi, MD;  Location: [place];  Service: Orthopedics;  Laterality: Right;  . COLONOSCOPY  2007   Dr. Benson Norway  . I & D EXTREMITY Right 04/02/2016   Procedure: IRRIGATION AND DEBRIDEMENT GREAT TOE;  Surgeon: Leandrew Koyanagi, MD;  Location: [place];  Service: Orthopedics;  Laterality: Right;  . RADIOACTIVE SEED IMPLANT  2003  . ULNAR TUNNEL RELEASE Right 01/09/2019   Procedure: RIGHT CUBITAL TUNNEL RELEASE AND CARPAL TUNNEL RELEASE;  Surgeon: Leandrew Koyanagi, MD;  Location: [place];  Service: Orthopedics;  Laterality: Right;   Family History  Problem Relation Age of Onset  . Heart failure Mother   . Diabetes Mother   . Stroke Father   . Diabetes Father   . Diabetes Sister   . Diabetes Sister   . Lung cancer Sister   . Colon cancer Neg Hx   .  Esophageal cancer Neg Hx   . Rectal cancer Neg Hx    Social History   Tobacco Use  . Smoking status: Former Smoker    Quit date: 10/09/1987    Years since quitting: 31.5  . Smokeless tobacco: Never Used  Substance Use Topics  . Alcohol use: No  . Drug use: No   Current Outpatient Medications  Medication Sig Dispense Refill  . amLODipine (NORVASC) 5 MG tablet Take 1 tablet (5 mg total) by mouth daily. 90 tablet 3  . aspirin EC 81 MG tablet Take 81 mg by mouth daily.    . Blood Glucose Monitoring Suppl (ONETOUCH VERIO) w/Device KIT USE TO CHECK SUGAR DAILY 1 kit 0  . Blood Glucose Monitoring Suppl (ONETOUCH VERIO) w/Device KIT USE TO CHECK SUGAR DAILY 1 kit 0  . cholecalciferol (VITAMIN D) 1000 units tablet Take 1,000 Units by mouth  daily.    . cilostazol (PLETAL) 100 MG tablet Take 1 tablet (100 mg total) by mouth 2 (two) times daily before a meal. 180 tablet 3  . diclofenac (VOLTAREN) 75 MG EC tablet 1 po bid prn 30 tablet 0  . HYDROcodone-acetaminophen (NORCO) 7.5-325 MG tablet Take 1-2 tablets by mouth 3 (three) times daily as needed for moderate pain. 30 tablet 0  . LANCETS MICRO THIN 33G MISC Patient is to test two times a day DX: E11.9 (Onetouch verio flex) 100 each 12  . lisinopril-hydrochlorothiazide (ZESTORETIC) 20-12.5 MG tablet Take 1 tablet by mouth daily. 90 tablet 2  . metFORMIN (GLUCOPHAGE-XR) 500 MG 24 hr tablet 1 pill daily 90 tablet 1  . Multiple Vitamin (MULTIVITAMIN WITH MINERALS) TABS tablet Take 1 tablet by mouth daily.    . ondansetron (ZOFRAN) 4 MG tablet Take 1-2 tablets (4-8 mg total) by mouth every 8 (eight) hours as needed for nausea or vomiting. 40 tablet 0  . ONETOUCH VERIO test strip USE AS INSTRUCTED TO CHECK TWICE DAILY 200 strip 5  . simvastatin (ZOCOR) 40 MG tablet Take 1 tablet (40 mg total) by mouth daily. 90 tablet 3  . solifenacin (VESICARE) 10 MG tablet Take 1 tablet (10 mg total) by mouth daily. 30 tablet 5   No current facility-administered medications for this visit.   No Known Allergies   Review of Systems: All systems reviewed and negative except where noted in HPI.   Serum creatinine: 1.3 mg/dL (H) 04/02/19 0955 Estimated creatinine clearance: 47.9 mL/min (A)   Physical Exam:    Wt Readings from Last 3 Encounters:  04/11/19 189 lb (85.7 kg)  04/02/19 198 lb 12.8 oz (90.2 kg)  02/18/19 199 lb 9.6 oz (90.5 kg)    BP 140/60   Pulse 92   Temp 98.7 F (37.1 C)   Ht 5' 7.5" (1.715 m)   Wt 189 lb (85.7 kg)   BMI 29.16 kg/m  Constitutional:  Pleasant male in no acute distress. Psychiatric: Normal mood and affect. Behavior is normal. EENT: Pupils normal.  Conjunctivae are normal. No scleral icterus. Neck supple.  Cardiovascular: Normal rate, regular rhythm. No  edema Pulmonary/chest: Effort normal and breath sounds normal. No wheezing, rales or rhonchi. Abdominal: Soft, nondistended, nontender. Bowel sounds active throughout. There are no masses palpable. No hepatomegaly. Neurological: Alert and oriented to person place and time. Skin: Skin is warm and dry. No rashes noted.  Tye Savoy, NP  04/11/2019, 3:39 PM  Cc:  Referring Provider [name], MD

## 2019-04-11 NOTE — Patient Instructions (Signed)
If you are age 81 or older, your body mass index should be between 23-30. Your Body mass index is 29.16 kg/m. If this is out of the aforementioned range listed, please consider follow up with your Primary Care Provider.  If you are age 33 or younger, your body mass index should be between 19-25. Your Body mass index is 29.16 kg/m. If this is out of the aformentioned range listed, please consider follow up with your Primary Care Provider.   You have been scheduled for an endoscopy. Please follow written instructions given to you at your visit today. If you use inhalers (even only as needed), please bring them with you on the day of your procedure. Your physician has requested that you go to www.startemmi.com and enter the access code given to you at your visit today. This web site gives a general overview about your procedure. However, you should still follow specific instructions given to you by our office regarding your preparation for the procedure.  You will be contacted by our office prior to your procedure for directions on holding your Pletal.  If you do not hear from our office 1 week prior to your scheduled procedure, please call [phone] to discuss.   Continue Fiber supplement.  We have sent the following medications to your pharmacy for you to pick up at your convenience: Linzess 72 mcg   Drink 64 ounces of water daily.  Thank you for choosing me and [facility].   Tye Savoy, NP

## 2019-04-11 NOTE — Telephone Encounter (Signed)
  [facility] [address] [place], [place]  [address] Phone:  [phone]   Fax:  [phone]   04/11/2019   RE:      Shaun Kennedy DOB:   10-05-1940 MRN:   [id number]   Dear Dr. Redmond School,    We have scheduled the above patient for an endoscopic procedure. Our records show that he is on anticoagulation therapy.   Please advise as to whether the patient may come off his therapy of Pletal 2 days prior to the EGD procedure, which is scheduled for 05/16/19.  Please fax back/ or route to [place], [place] at [phone].   Sincerely,    Thurmon Fair, RMA

## 2019-04-14 NOTE — Progress Notes (Signed)
Attending Physician's Attestation   I have reviewed the chart.   I agree with the Advanced Practitioner's note, impression, and recommendations with any updates as below.  Based on patient's clinical history as outlined by PA Chester Holstein, I agree that that diagnostic endoscopy is important.  We will move forward with that.  May need a diagnostic colonoscopy as well due to issues of chronic constipation and based on report suggesting last colonoscopy was 14 years ago or so.  We will see how he does with Linzess.  Justice Britain, MD [facility] Advanced Endoscopy Office # [phone]

## 2019-04-17 NOTE — Telephone Encounter (Signed)
Received response from Dr. Redmond School stating okay to HOLD Pletal 2 days prior to EGD. Spoke with patient he verbalized understanding.  Letter scanned to chart.

## 2019-04-22 ENCOUNTER — Other Ambulatory Visit: Payer: Self-pay | Admitting: Family Medicine

## 2019-04-29 ENCOUNTER — Ambulatory Visit: Payer: Medicare HMO | Admitting: Gastroenterology

## 2019-05-01 ENCOUNTER — Ambulatory Visit: Payer: Medicare HMO | Attending: Internal Medicine

## 2019-05-01 DIAGNOSIS — Z23 Encounter for immunization: Secondary | ICD-10-CM

## 2019-05-01 NOTE — Progress Notes (Signed)
   Covid-19 Vaccination Clinic  Name:  Shaun Kennedy    MRN: [id number] DOB: 04/08/1940  05/01/2019  Mr. [name] was observed post Covid-19 immunization for 15 minutes without incident. He was provided with Vaccine Information Sheet and instruction to access the V-Safe system.   Mr. [name] was instructed to call 911 with any severe reactions post vaccine: Marland Kitchen Difficulty breathing  . Swelling of face and throat  . A fast heartbeat  . A bad rash all over body  . Dizziness and weakness   Immunizations Administered    Name Date Dose VIS Date Route   Pfizer COVID-19 Vaccine 05/01/2019 11:03 AM 0.3 mL 01/18/2019 Intramuscular   Manufacturer: [place]   Lot: [id number]   [place]: [id number]

## 2019-05-05 DIAGNOSIS — R69 Illness, unspecified: Secondary | ICD-10-CM | POA: Diagnosis not present

## 2019-05-09 ENCOUNTER — Encounter: Payer: Self-pay | Admitting: Gastroenterology

## 2019-05-16 ENCOUNTER — Ambulatory Visit (AMBULATORY_SURGERY_CENTER): Payer: Medicare HMO | Admitting: Gastroenterology

## 2019-05-16 ENCOUNTER — Encounter: Payer: Self-pay | Admitting: Gastroenterology

## 2019-05-16 ENCOUNTER — Other Ambulatory Visit: Payer: Self-pay

## 2019-05-16 VITALS — BP 131/66 | HR 77 | Temp 96.2°F | Resp 22 | Ht 67.0 in | Wt 189.0 lb

## 2019-05-16 DIAGNOSIS — K5909 Other constipation: Secondary | ICD-10-CM | POA: Diagnosis not present

## 2019-05-16 DIAGNOSIS — K222 Esophageal obstruction: Secondary | ICD-10-CM

## 2019-05-16 DIAGNOSIS — K2950 Unspecified chronic gastritis without bleeding: Secondary | ICD-10-CM

## 2019-05-16 DIAGNOSIS — R1310 Dysphagia, unspecified: Secondary | ICD-10-CM

## 2019-05-16 DIAGNOSIS — K449 Diaphragmatic hernia without obstruction or gangrene: Secondary | ICD-10-CM

## 2019-05-16 DIAGNOSIS — K3189 Other diseases of stomach and duodenum: Secondary | ICD-10-CM

## 2019-05-16 DIAGNOSIS — K228 Other specified diseases of esophagus: Secondary | ICD-10-CM | POA: Diagnosis not present

## 2019-05-16 DIAGNOSIS — R634 Abnormal weight loss: Secondary | ICD-10-CM | POA: Diagnosis not present

## 2019-05-16 MED ORDER — OMEPRAZOLE 40 MG PO CPDR: 40.0000 mg | DELAYED_RELEASE_CAPSULE | Freq: Two times a day (BID) | ORAL | 3 refills | Status: DC

## 2019-05-16 MED ORDER — SODIUM CHLORIDE 0.9 % IV SOLN: 500.0000 mL | INTRAVENOUS | Status: DC

## 2019-05-16 NOTE — Progress Notes (Signed)
Temp LC V/s KA

## 2019-05-16 NOTE — Progress Notes (Signed)
pt tolerated well. VSS. awake and to recovery. Report given to RN. Oral bite block placed and removed without trauma.

## 2019-05-16 NOTE — Patient Instructions (Addendum)
YOU HAD AN ENDOSCOPIC PROCEDURE TODAY AT [facility]:   Refer to the procedure report that was given to you for any specific questions about what was found during the examination.  If the procedure report does not answer your questions, please call your gastroenterologist to clarify.  If you requested that your care partner not be given the details of your procedure findings, then the procedure report has been included in a sealed envelope for you to review at your convenience later.  YOU SHOULD EXPECT: Some feelings of bloating in the abdomen. Passage of more gas than usual.  Walking can help get rid of the air that was put into your GI tract during the procedure and reduce the bloating. If you had a lower endoscopy (such as a colonoscopy or flexible sigmoidoscopy) you may notice spotting of blood in your stool or on the toilet paper. If you underwent a bowel prep for your procedure, you may not have a normal bowel movement for a few days.  Please Note:  You might notice some irritation and congestion in your nose or some drainage.  This is from the oxygen used during your procedure.  There is no need for concern and it should clear up in a day or so.  SYMPTOMS TO REPORT IMMEDIATELY:    Following upper endoscopy (EGD)  Vomiting of blood or coffee ground material  New chest pain or pain under the shoulder blades  Painful or persistently difficult swallowing  New shortness of breath  Fever of 100F or higher  Black, tarry-looking stools  For urgent or emergent issues, a gastroenterologist can be reached at any hour by calling [phone]. Do not use MyChart messaging for urgent concerns.    DIET:  Follow a Post-dilation Diet (see handout): Nothing by mouth until 11:30am, Clear Liquids only from 11:30 am to 12:30 pm, Then a soft diet for the rest of today from 12:30pm through tomorrow am, but then you may proceed to your regular diet tomorrow morning. Drink plenty of fluids  but you should avoid alcoholic beverages for 24 hours.  MEDICATIONS: Continue present medications. Start Omeprazole 40 mg by mouth twice daily.  Please see handouts given to you by your recovery nurse.   ACTIVITY:  You should plan to take it easy for the rest of today and you should NOT DRIVE or use heavy machinery until tomorrow (because of the sedation medicines used during the test).    FOLLOW UP: Our staff will call the number listed on your records 48-72 hours following your procedure to check on you and address any questions or concerns that you may have regarding the information given to you following your procedure. If we do not reach you, we will leave a message.  We will attempt to reach you two times.  During this call, we will ask if you have developed any symptoms of COVID 19. If you develop any symptoms (ie: fever, flu-like symptoms, shortness of breath, cough etc.) before then, please call [phone].  If you test positive for Covid 19 in the 2 weeks post procedure, please call and report this information to [place].    If any biopsies were taken you will be contacted by phone or by letter within the next 1-3 weeks.  Please call us at [phone] if you have not heard about the biopsies in 3 weeks.   Thank you for allowing [place] to provide for your healthcare needs today.   SIGNATURES/CONFIDENTIALITY: You and/or your care  partner have signed paperwork which will be entered into your electronic medical record.  These signatures attest to the fact that that the information above on your After Visit Summary has been reviewed and is understood.  Full responsibility of the confidentiality of this discharge information lies with you and/or your care-partner.

## 2019-05-16 NOTE — Op Note (Signed)
[facility] Patient Name: Shaun Kennedy Procedure Date: 05/16/2019 9:51 AM MRN: [id number] Endoscopist: Justice Britain , MD Age: 79 Referring MD:  Date of Birth: May 29, 1940 Gender: Male Account #: [id number] Procedure:                Upper GI endoscopy Indications:              Dysphagia, Unexplained chest pain, Dyspepsia, Early                            satiety Medicines:                Monitored Anesthesia Care Procedure:                Pre-Anesthesia Assessment:                           - Prior to the procedure, a History and Physical                            was performed, and patient medications and                            allergies were reviewed. The patient's tolerance of                            previous anesthesia was also reviewed. The risks                            and benefits of the procedure and the sedation                            options and risks were discussed with the patient.                            All questions were answered, and informed consent                            was obtained. Prior Anticoagulants: The patient has                            taken no previous anticoagulant or antiplatelet                            agents except for aspirin. ASA Grade Assessment:                            III - A patient with severe systemic disease. After                            reviewing the risks and benefits, the patient was                            deemed in satisfactory condition to undergo the  procedure.                           After obtaining informed consent, the endoscope was                            passed under direct vision. Throughout the                            procedure, the patient's blood pressure, pulse, and                            oxygen saturations were monitored continuously. The                            Endoscope was introduced through the mouth, and                             advanced to the second part of duodenum. The upper                            GI endoscopy was accomplished without difficulty.                            The patient tolerated the procedure. Scope In: Scope Out: Findings:                 No gross mucosal lesions were noted in the entire                            esophagus. Biopsies were taken with a cold forceps                            for histology to rule out EoE/LoE.                           A widely patent and non-obstructing Schatzki ring                            was found at the gastroesophageal junction.                            Biopsies were taken with a cold forceps for                            histology to disrupt the ring.                           A guidewire was placed and the scope was withdrawn.                            Dilation was performed in the entire esophagus with                            a Savary  dilator with mild resistance at 16 mm, 17                            mm and 18 mm.                           A 3 cm hiatal hernia was found. The proximal extent                            of the gastric folds (end of tubular esophagus) was                            38 cm from the incisors. The hiatal narrowing was                            41 cm from the incisors. The Z-line was a variable                            distance from incisors; the hiatal hernia was                            sliding.                           Multiple dispersed small erosions with no bleeding                            and no stigmata of recent bleeding were found in                            the gastric fundus, in the gastric body, at the                            incisura and in the gastric antrum.                           No other gross lesions were noted in the entire                            examined stomach. Biopsies were taken with a cold                            forceps for histology and Helicobacter pylori                             testing.                           No gross lesions were noted in the duodenal bulb,                            in the first portion of the duodenum and in the  second portion of the duodenum. Complications:            No immediate complications. Estimated Blood Loss:     Estimated blood loss was minimal. Impression:               - No gross mucosal lesions in esophagus. Biopsied.                           - Widely patent and non-obstructing Schatzki ring.                            Disrupted.                           - Esophageal Savary dilation performed.                           - 3 cm hiatal hernia.                           - Erosive gastropathy with no bleeding and no                            stigmata of recent bleeding. No other gross lesions                            in the stomach. Biopsied for HP.                           - No gross lesions in the duodenal bulb, in the                            first portion of the duodenum and in the second                            portion of the duodenum. Recommendation:           - The patient will be observed post-procedure,                            until all discharge criteria are met.                           - Discharge patient to home.                           - Patient has a contact number available for                            emergencies. The signs and symptoms of potential                            delayed complications were discussed with the                            patient. Return to normal activities tomorrow.  Written discharge instructions were provided to the                            patient.                           - Dilation Diet to be discussed/followed.                           - Start Omeprazole 40 mg twice daily.                           - Continue present medications.                           - Await pathology  results.                           - If dysphagia symptoms persist then would                            recommend considering Manometry if no significant                            improvement with medical therapy and dilation today.                           - Consider repeat upper endoscopy PRN for                            retreatment if he has durable effect and CRE                            dilation of the Schatzki ring at next.                           - The findings and recommendations were discussed                            with the patient.                           - The findings and recommendations were discussed                            with the patient's family. Justice Britain, MD 05/16/2019 10:27:37 AM

## 2019-05-17 ENCOUNTER — Telehealth: Payer: Self-pay | Admitting: Family Medicine

## 2019-05-17 NOTE — Telephone Encounter (Signed)
Explain to him that it is not unusual to require multiple meds for blood pressure control.

## 2019-05-17 NOTE — Telephone Encounter (Signed)
Pt called and wants to know why he is on 2 different blood pressure medicines pt can be reached at [phone]

## 2019-05-20 ENCOUNTER — Telehealth: Payer: Self-pay | Admitting: *Deleted

## 2019-05-20 NOTE — Telephone Encounter (Signed)
  Follow up Call-  Call back number 05/16/2019  Post procedure Call Back phone  # [phone]  Permission to leave phone message Yes  Some recent data might be hidden     Patient questions:  Do you have a fever, pain , or abdominal swelling? No. Pain Score  0 *  Have you tolerated food without any problems? Yes.    Have you been able to return to your normal activities? Yes.    Do you have any questions about your discharge instructions: Diet   No. Medications  No. Follow up visit  No.  Do you have questions or concerns about your Care? No.  Actions: * If pain score is 4 or above: No action needed, pain <4.  1. Have you developed a fever since your procedure? no  2.   Have you had an respiratory symptoms (SOB or cough) since your procedure? no  3.   Have you tested positive for COVID 19 since your procedure no  4.   Have you had any family members/close contacts diagnosed with the COVID 19 since your procedure?  no   If yes to any of these questions please route to Joylene John, RN and Erenest Rasher, RN

## 2019-05-21 NOTE — Telephone Encounter (Signed)
LVM FOR PT ADVISING OF INFO. [place]

## 2019-05-22 ENCOUNTER — Encounter: Payer: Self-pay | Admitting: Gastroenterology

## 2019-05-23 ENCOUNTER — Other Ambulatory Visit: Payer: Self-pay

## 2019-05-23 ENCOUNTER — Ambulatory Visit (INDEPENDENT_AMBULATORY_CARE_PROVIDER_SITE_OTHER): Payer: Medicare HMO | Admitting: Family Medicine

## 2019-05-23 ENCOUNTER — Encounter: Payer: Self-pay | Admitting: Family Medicine

## 2019-05-23 VITALS — BP 160/80 | HR 80 | Temp 97.9°F | Wt 184.0 lb

## 2019-05-23 DIAGNOSIS — R6881 Early satiety: Secondary | ICD-10-CM

## 2019-05-23 DIAGNOSIS — S31819A Unspecified open wound of right buttock, initial encounter: Secondary | ICD-10-CM | POA: Diagnosis not present

## 2019-05-23 DIAGNOSIS — R634 Abnormal weight loss: Secondary | ICD-10-CM

## 2019-05-23 DIAGNOSIS — K449 Diaphragmatic hernia without obstruction or gangrene: Secondary | ICD-10-CM

## 2019-05-23 NOTE — Progress Notes (Addendum)
   Subjective:    Patient ID: Shaun Kennedy, male    DOB: 1940/04/24, 79 y.o.   MRN: [id number]  HPI He is here for a recheck.  He was recently seen by gastroenterology.  The endoscopy showed hiatus hernia as well as gastric erosions.  He was given Linzess to help with constipation which he states has helped.  He is also taking Prilosec 40 mg.  No evidence of H. pylori was seen.  He continues to complain of anorexia.  His last hemoglobin A1c was 6.6.  He also complains of a gluteal lesion that he would like me to look at.   Review of Systems     Objective:   Physical Exam Alert and in no distress.  Raw area noted in the gluteal cleft.  No drainage or induration was noted.       Assessment & Plan:  Weight loss  HH (hiatus hernia)  Early satiety  Wound of gluteal cleft, right, initial encounter I recommend a antibiotic ointment to use on the gluteal cleft area.  He will keep me informed as to the progress. I then discussed the weight loss, hiatus hernia, anorexia with him and his wife via phone. I then had another discussion with his daughter via phone.  She is a Marine scientist.  I went over the issue of the hiatus hernia, use of Linzess and Prilosec.  She mentioned the use of Marinol and I explained that I did not think it was appropriate at this time.  Discussed the fact that further follow-up with GI after another week to make sure the Prilosec has had a full chance to work.  The note indicates possible manometry and colonoscopy.  Over 30 minutes spent discussing this with him his wife and daughter. There is also possibility that diabetic gastroparesis might play a role in this.

## 2019-05-27 DIAGNOSIS — H5203 Hypermetropia, bilateral: Secondary | ICD-10-CM | POA: Diagnosis not present

## 2019-05-27 DIAGNOSIS — H524 Presbyopia: Secondary | ICD-10-CM | POA: Diagnosis not present

## 2019-05-27 DIAGNOSIS — H52209 Unspecified astigmatism, unspecified eye: Secondary | ICD-10-CM | POA: Diagnosis not present

## 2019-05-30 ENCOUNTER — Other Ambulatory Visit: Payer: Self-pay

## 2019-05-30 ENCOUNTER — Telehealth: Payer: Self-pay | Admitting: Gastroenterology

## 2019-05-30 DIAGNOSIS — Z1159 Encounter for screening for other viral diseases: Secondary | ICD-10-CM

## 2019-05-30 NOTE — Telephone Encounter (Signed)
Dr Rush Landmark the pt is calling due to concerns that his weight loss has increased to 49 pounds in 2 months.  Per your letter after EGD pt is to have repeat EGD in July.  He is concerned that is too far out and also wants to know if he should have colonoscopy as well.  Please advise

## 2019-05-30 NOTE — Telephone Encounter (Signed)
Although I do not expect to find anything in particular with repeating EGD so soon, he has indication due to the Intestinal Metaplasia. Please move forward with scheduling EGD/Colonoscopy next available. If the double procedure is too far out then just a Colonoscopy is reasonable. Thank you. GM

## 2019-05-30 NOTE — Telephone Encounter (Signed)
Last procedure done in [place]. I would think he would remain a candidate. Thanks. GM

## 2019-05-30 NOTE — Telephone Encounter (Signed)
The pt has been scheduled for pre visit, endo colon and COVID testing. The pt has been advised and will call with any questions or concerns.

## 2019-05-30 NOTE — Telephone Encounter (Signed)
Dr Rush Landmark can these be done in the [place]?

## 2019-05-30 NOTE — Telephone Encounter (Signed)
Pt reported that he has been experiencing weight loss and loss of appetite.  He stated that he has lost 49 lbs in two months.  Pt is concerned that his recall EGD is in July.  He would like to discuss having another EGD sooner.  He would also like to have a colonoscopy.

## 2019-06-18 ENCOUNTER — Other Ambulatory Visit: Payer: Self-pay

## 2019-06-18 ENCOUNTER — Ambulatory Visit (INDEPENDENT_AMBULATORY_CARE_PROVIDER_SITE_OTHER): Payer: Medicare HMO | Admitting: Family Medicine

## 2019-06-18 VITALS — BP 130/78 | HR 98 | Temp 98.0°F | Wt 179.0 lb

## 2019-06-18 DIAGNOSIS — R634 Abnormal weight loss: Secondary | ICD-10-CM | POA: Diagnosis not present

## 2019-06-18 DIAGNOSIS — D649 Anemia, unspecified: Secondary | ICD-10-CM

## 2019-06-18 LAB — CBC WITH DIFFERENTIAL/PLATELET
Basophils Absolute: 0 10*3/uL (ref 0.0–0.2)
Basos: 0 %
EOS (ABSOLUTE): 0.4 10*3/uL (ref 0.0–0.4)
Eos: 5 %
Hematocrit: 28.8 % — ABNORMAL LOW (ref 37.5–51.0)
Hemoglobin: 9.5 g/dL — ABNORMAL LOW (ref 13.0–17.7)
Immature Grans (Abs): 0 10*3/uL (ref 0.0–0.1)
Immature Granulocytes: 0 %
Lymphocytes Absolute: 1.8 10*3/uL (ref 0.7–3.1)
Lymphs: 23 %
MCH: 29.6 pg (ref 26.6–33.0)
MCHC: 33 g/dL (ref 31.5–35.7)
MCV: 90 fL (ref 79–97)
Monocytes Absolute: 0.6 10*3/uL (ref 0.1–0.9)
Monocytes: 7 %
Neutrophils Absolute: 5 10*3/uL (ref 1.4–7.0)
Neutrophils: 65 %
Platelets: 520 10*3/uL — ABNORMAL HIGH (ref 150–450)
RBC: 3.21 x10E6/uL — ABNORMAL LOW (ref 4.14–5.80)
RDW: 12.3 % (ref 11.6–15.4)
WBC: 7.8 10*3/uL (ref 3.4–10.8)

## 2019-06-18 MED ORDER — DRONABINOL 2.5 MG PO CAPS: 2.5000 mg | ORAL_CAPSULE | Freq: Two times a day (BID) | ORAL | 1 refills | Status: DC

## 2019-06-18 NOTE — Progress Notes (Signed)
   Subjective:    Patient ID: Shaun Kennedy, male    DOB: 09-16-1940, 79 y.o.   MRN: [id number]  HPI He is here for consult concerning continued weight loss.  He still has early satiety and is interested in using Marinol.  He is scheduled for colonoscopy May 27th.  He recently had an endoscopy which was negative except for hiatus hernia and Schatzki's ring.  Recent hemoglobin was slightly low.   Review of Systems     Objective:   Physical Exam Alert and in no distress otherwise not examined       Assessment & Plan:  Weight loss - Plan: dronabinol (MARINOL) 2.5 MG capsule  Anemia, unspecified type - Plan: CBC with Differential/Platelet Since he has a colonoscopy scheduled, I am okay with giving him Marinol to see if that will help with his appetite.  Discussed with him and his daughter who is a Marine scientist the possibility of colon cancer.  He did have a colonoscopy in 2007 which was negative. He relates that we did have a discussion when he was 73 about repeat colonoscopy and since he was having no symptoms decision was made to not order that.  Over 20 minutes spent discussing this with him and his daughter.

## 2019-06-19 ENCOUNTER — Telehealth: Payer: Self-pay

## 2019-06-19 NOTE — Telephone Encounter (Signed)
ok 

## 2019-06-19 NOTE — Telephone Encounter (Signed)
Pt daughter called and would like to have Vitamin D added to his labs. Please advise I this is ok to add on. [place]

## 2019-06-20 NOTE — Telephone Encounter (Signed)
Added 06-20-19. [place]

## 2019-06-21 ENCOUNTER — Telehealth: Payer: Self-pay

## 2019-06-21 ENCOUNTER — Other Ambulatory Visit: Payer: Self-pay

## 2019-06-21 DIAGNOSIS — E559 Vitamin D deficiency, unspecified: Secondary | ICD-10-CM

## 2019-06-21 NOTE — Telephone Encounter (Signed)
I submitted a PA for the pts. Dronabinol 2.5mg  capsules and it was approved from 02/08/19-02/07/20.

## 2019-06-25 ENCOUNTER — Other Ambulatory Visit: Payer: Medicare HMO

## 2019-06-25 ENCOUNTER — Other Ambulatory Visit: Payer: Self-pay

## 2019-06-25 DIAGNOSIS — E559 Vitamin D deficiency, unspecified: Secondary | ICD-10-CM

## 2019-06-26 LAB — VITAMIN D 25 HYDROXY (VIT D DEFICIENCY, FRACTURES): Vit D, 25-Hydroxy: 56.2 ng/mL (ref 30.0–100.0)

## 2019-07-03 ENCOUNTER — Telehealth: Payer: Self-pay | Admitting: Family Medicine

## 2019-07-03 NOTE — Telephone Encounter (Signed)
Pt called and states that the RX marinol 2.5 is not helping his appetite, pt wants to know if the does needs to be higher, pt uses [id number] - [place], [place] [address] [place]

## 2019-07-03 NOTE — Telephone Encounter (Signed)
Called and informed pt.  

## 2019-07-03 NOTE — Telephone Encounter (Signed)
Have him double the dose

## 2019-07-04 ENCOUNTER — Encounter: Payer: Self-pay | Admitting: Gastroenterology

## 2019-07-04 ENCOUNTER — Ambulatory Visit (AMBULATORY_SURGERY_CENTER): Payer: Self-pay

## 2019-07-04 ENCOUNTER — Other Ambulatory Visit: Payer: Self-pay

## 2019-07-04 VITALS — Ht 67.0 in | Wt 179.8 lb

## 2019-07-04 DIAGNOSIS — K3189 Other diseases of stomach and duodenum: Secondary | ICD-10-CM

## 2019-07-04 DIAGNOSIS — K2970 Gastritis, unspecified, without bleeding: Secondary | ICD-10-CM

## 2019-07-04 DIAGNOSIS — Z85038 Personal history of other malignant neoplasm of large intestine: Secondary | ICD-10-CM

## 2019-07-04 DIAGNOSIS — R634 Abnormal weight loss: Secondary | ICD-10-CM

## 2019-07-04 NOTE — Progress Notes (Signed)
No egg or soy allergy known to patient  No issues with past sedation with any surgeries  or procedures, no intubation problems  No diet pills per patient No home 02 use per patient   Pt is on blood thinners per patient   Pt aware to hold PLETAL (LAST DOSE ON 5/31) FOR 2 DAYS UNTIL AFTER THE PROCEDURE AND WILL BE DIRECTED THEN WHEN TO RESUME.  Pt denies issues with constipation  No A fib or A flutter  EMMI video sent to pt's e mail   Due to the COVID-19 pandemic we are asking patients to follow these guidelines. Please only bring one care partner. Please be aware that your care partner may wait in the car in the parking lot or if they feel like they will be too hot to wait in the car, they may wait in the lobby on the 4th floor. All care partners are required to wear a mask the entire time (we do not have any that we can provide them), they need to practice social distancing, and we will do a Covid check for all patient's and care partners when you arrive. Also we will check their temperature and your temperature. If the care partner waits in their car they need to stay in the parking lot the entire time and we will call them on their cell phone when the patient is ready for discharge so they can bring the car to the front of the building. Also all patient's will need to wear a mask into building.

## 2019-07-05 ENCOUNTER — Other Ambulatory Visit: Payer: Self-pay | Admitting: Family Medicine

## 2019-07-05 DIAGNOSIS — E119 Type 2 diabetes mellitus without complications: Secondary | ICD-10-CM

## 2019-07-10 ENCOUNTER — Telehealth: Payer: Self-pay | Admitting: *Deleted

## 2019-07-10 ENCOUNTER — Telehealth: Payer: Self-pay | Admitting: Gastroenterology

## 2019-07-10 NOTE — Telephone Encounter (Signed)
Pt. Called to offer earlier appointment,he will arrive at 8 am for 9 am procedure instructed pt to take 2nd 1/2 of prep @ 4am and nothing after 6 am,pt. Verbalized understanding.

## 2019-07-10 NOTE — Telephone Encounter (Signed)
Patient is scheduled to see Dr. Rush Landmark on 07/11/19 at 2:30. Patient had bought the Miralax packets and wanted to know how many packets he's supposed to mix, and where he could get the Ducolax. Patient explained to me that each packet only had 7grams of miralax and he only had 10 packets. I advised the patient to go to closest [facility] to purchase a bottle of the 238 gram miralax. Patient verbalized understanding, and said he will go purchase the bottle this morning. Advised patient to call office back if he has any further questions or concerns. He verbalized understanding.

## 2019-07-11 ENCOUNTER — Encounter: Payer: Self-pay | Admitting: Gastroenterology

## 2019-07-11 ENCOUNTER — Other Ambulatory Visit: Payer: Self-pay

## 2019-07-11 ENCOUNTER — Ambulatory Visit (AMBULATORY_SURGERY_CENTER): Payer: Medicare HMO | Admitting: Gastroenterology

## 2019-07-11 VITALS — BP 159/82 | HR 83 | Temp 98.0°F | Resp 21 | Ht 67.0 in | Wt 179.0 lb

## 2019-07-11 DIAGNOSIS — R634 Abnormal weight loss: Secondary | ICD-10-CM | POA: Diagnosis not present

## 2019-07-11 DIAGNOSIS — K2950 Unspecified chronic gastritis without bleeding: Secondary | ICD-10-CM | POA: Diagnosis not present

## 2019-07-11 DIAGNOSIS — K449 Diaphragmatic hernia without obstruction or gangrene: Secondary | ICD-10-CM

## 2019-07-11 DIAGNOSIS — Z1211 Encounter for screening for malignant neoplasm of colon: Secondary | ICD-10-CM | POA: Diagnosis not present

## 2019-07-11 DIAGNOSIS — K222 Esophageal obstruction: Secondary | ICD-10-CM

## 2019-07-11 DIAGNOSIS — K3189 Other diseases of stomach and duodenum: Secondary | ICD-10-CM | POA: Diagnosis not present

## 2019-07-11 DIAGNOSIS — Z85038 Personal history of other malignant neoplasm of large intestine: Secondary | ICD-10-CM

## 2019-07-11 DIAGNOSIS — E119 Type 2 diabetes mellitus without complications: Secondary | ICD-10-CM | POA: Diagnosis not present

## 2019-07-11 DIAGNOSIS — K640 First degree hemorrhoids: Secondary | ICD-10-CM

## 2019-07-11 DIAGNOSIS — K219 Gastro-esophageal reflux disease without esophagitis: Secondary | ICD-10-CM | POA: Diagnosis not present

## 2019-07-11 DIAGNOSIS — Z538 Procedure and treatment not carried out for other reasons: Secondary | ICD-10-CM | POA: Diagnosis not present

## 2019-07-11 DIAGNOSIS — K644 Residual hemorrhoidal skin tags: Secondary | ICD-10-CM | POA: Diagnosis not present

## 2019-07-11 DIAGNOSIS — I499 Cardiac arrhythmia, unspecified: Secondary | ICD-10-CM | POA: Diagnosis not present

## 2019-07-11 DIAGNOSIS — R1310 Dysphagia, unspecified: Secondary | ICD-10-CM | POA: Diagnosis not present

## 2019-07-11 DIAGNOSIS — R69 Illness, unspecified: Secondary | ICD-10-CM | POA: Diagnosis not present

## 2019-07-11 DIAGNOSIS — I10 Essential (primary) hypertension: Secondary | ICD-10-CM | POA: Diagnosis not present

## 2019-07-11 DIAGNOSIS — E669 Obesity, unspecified: Secondary | ICD-10-CM | POA: Diagnosis not present

## 2019-07-11 MED ORDER — SODIUM CHLORIDE 0.9 % IV SOLN: 500.0000 mL | Freq: Once | INTRAVENOUS | Status: DC

## 2019-07-11 NOTE — Op Note (Signed)
[place] Patient Name: Shaun Kennedy Procedure Date: 07/11/2019 9:26 AM MRN: [id number] Endoscopist: Justice Britain , MD Age: 79 Referring MD:  Date of Birth: January 07, 1941 Gender: Male Account #: [id number] Procedure:                Upper GI endoscopy Indications:              Intestinal metaplasia, Follow-up of intestinal                            metaplasia; patient with previous dysphagia but                            feels improved currently Medicines:                Monitored Anesthesia Care Procedure:                Pre-Anesthesia Assessment:                           - Prior to the procedure, a History and Physical                            was performed, and patient medications and                            allergies were reviewed. The patient's tolerance of                            previous anesthesia was also reviewed. The risks                            and benefits of the procedure and the sedation                            options and risks were discussed with the patient.                            All questions were answered, and informed consent                            was obtained. Prior Anticoagulants: The patient has                            taken Pletal (cilostazol), last dose was 2 days                            prior to procedure. ASA Grade Assessment: III - A                            patient with severe systemic disease. After                            reviewing the risks and benefits, the patient was  deemed in satisfactory condition to undergo the                            procedure.                           After obtaining informed consent, the endoscope was                            passed under direct vision. Throughout the                            procedure, the patient's blood pressure, pulse, and                            oxygen saturations were monitored continuously. The          Endoscope was introduced through the mouth, and                            advanced to the second part of duodenum. The upper                            GI endoscopy was accomplished without difficulty.                            The patient tolerated the procedure. Scope In: Scope Out: Findings:                 No gross lesions were noted in the entire esophagus.                           A widely patent and non-obstructing Schatzki ring                            was found at the gastroesophageal junction.                           A small hiatal hernia was present.                           No gross lesions were noted in the entire examined                            stomach. Due to the previously biopsied intestinal                            metaplastia, decision made to proceed with Gastric                            Mapping. Biopsies were taken with a cold forceps                            for histology from the cardia. Biopsies were taken  with a cold forceps for histology from the fundus.                            Biopsies were taken with a cold forceps for                            histology from the greater curve. Biopsies were                            taken with a cold forceps for histology from the                            lesser curve. Biopsies were taken with a cold                            forceps for histology from the incisura. Biopsies                            were taken with a cold forceps for histology from                            the antrum.                           No gross lesions were noted in the duodenal bulb,                            in the first portion of the duodenum and in the                            second portion of the duodenum. Complications:            No immediate complications. Estimated Blood Loss:     Estimated blood loss was minimal. Impression:               - No gross lesions in esophagus.                            - Widely patent and non-obstructing Schatzki ring.                           - Small hiatal hernia.                           - No gross lesions in the stomach. Gastrip mapping                            performed.                           - No gross lesions in the duodenal bulb, in the                            first portion of the duodenum and in the second  portion of the duodenum. Recommendation:           - Proceed to scheduled colonoscopy.                           - Observe patient's clinical course.                           - Await pathology results.                           - Continue current PPI therapy.                           - The findings and recommendations were discussed                            with the patient.                           - The findings and recommendations were discussed                            with the patient's family. Justice Britain, MD 07/11/2019 10:16:32 AM

## 2019-07-11 NOTE — Progress Notes (Signed)
Verbal order for pt. To start pletal on 07/12/19.

## 2019-07-11 NOTE — Patient Instructions (Signed)
YOU HAD AN ENDOSCOPIC PROCEDURE TODAY AT [facility]:   Refer to the procedure report that was given to you for any specific questions about what was found during the examination.  If the procedure report does not answer your questions, please call your gastroenterologist to clarify.  If you requested that your care partner not be given the details of your procedure findings, then the procedure report has been included in a sealed envelope for you to review at your convenience later.  YOU SHOULD EXPECT: Some feelings of bloating in the abdomen. Passage of more gas than usual.  Walking can help get rid of the air that was put into your GI tract during the procedure and reduce the bloating. If you had a lower endoscopy (such as a colonoscopy or flexible sigmoidoscopy) you may notice spotting of blood in your stool or on the toilet paper. If you underwent a bowel prep for your procedure, you may not have a normal bowel movement for a few days.  Please Note:  You might notice some irritation and congestion in your nose or some drainage.  This is from the oxygen used during your procedure.  There is no need for concern and it should clear up in a day or so.  SYMPTOMS TO REPORT IMMEDIATELY:   Following lower endoscopy (colonoscopy or flexible sigmoidoscopy):  Excessive amounts of blood in the stool  Significant tenderness or worsening of abdominal pains  Swelling of the abdomen that is new, acute  Fever of 100F or higher   Following upper endoscopy (EGD)  Vomiting of blood or coffee ground material  New chest pain or pain under the shoulder blades  Painful or persistently difficult swallowing  New shortness of breath  Fever of 100F or higher  Black, tarry-looking stools  For urgent or emergent issues, a gastroenterologist can be reached at any hour by calling [phone]. Do not use MyChart messaging for urgent concerns.    DIET:  We do recommend a small meal at first, but  then you may proceed to your regular diet.  Drink plenty of fluids but you should avoid alcoholic beverages for 24 hours.  ACTIVITY:  You should plan to take it easy for the rest of today and you should NOT DRIVE or use heavy machinery until tomorrow (because of the sedation medicines used during the test).    FOLLOW UP: Our staff will call the number listed on your records 48-72 hours following your procedure to check on you and address any questions or concerns that you may have regarding the information given to you following your procedure. If we do not reach you, we will leave a message.  We will attempt to reach you two times.  During this call, we will ask if you have developed any symptoms of COVID 19. If you develop any symptoms (ie: fever, flu-like symptoms, shortness of breath, cough etc.) before then, please call [phone].  If you test positive for Covid 19 in the 2 weeks post procedure, please call and report this information to [place].    If any biopsies were taken you will be contacted by phone or by letter within the next 1-3 weeks.  Please call us at [phone] if you have not heard about the biopsies in 3 weeks.    SIGNATURES/CONFIDENTIALITY: You and/or your care partner have signed paperwork which will be entered into your electronic medical record.  These signatures attest to the fact that that the information above on  your After Visit Summary has been reviewed and is understood.  Full responsibility of the confidentiality of this discharge information lies with you and/or your care-partner.   Resume pletal on 07/12/19,resume remainder of medication today. Information  given on hiatal hernia.

## 2019-07-11 NOTE — Op Note (Signed)
[place] Patient Name: Shaun Kennedy Procedure Date: 07/11/2019 9:26 AM MRN: [id number] Endoscopist: Justice Britain , MD Age: 79 Referring MD:  Date of Birth: 02-11-1940 Gender: Male Account #: [id number] Procedure:                Colonoscopy Indications:              Screening for colorectal malignant neoplasm,                            Incidental - Weight loss Medicines:                Monitored Anesthesia Care Procedure:                Pre-Anesthesia Assessment:                           - Prior to the procedure, a History and Physical                            was performed, and patient medications and                            allergies were reviewed. The patient's tolerance of                            previous anesthesia was also reviewed. The risks                            and benefits of the procedure and the sedation                            options and risks were discussed with the patient.                            All questions were answered, and informed consent                            was obtained. Prior Anticoagulants: The patient has                            taken Pletal (cilostazol), last dose was 2 days                            prior to procedure. ASA Grade Assessment: III - A                            patient with severe systemic disease. After                            reviewing the risks and benefits, the patient was                            deemed in satisfactory condition to undergo the  procedure.                           After obtaining informed consent, the colonoscope                            was passed under direct vision. Throughout the                            procedure, the patient's blood pressure, pulse, and                            oxygen saturations were monitored continuously. The                            Colonoscope was introduced through the anus with                             the intention of advancing to the cecum. The scope                            was advanced to the descending colon before the                            procedure was aborted due to the patient developing                            bradycardia and also overall poor preparation being                            present. Medications were given. The colonoscopy                            was performed without difficulty. The patient                            tolerated the procedure poorly due to the patient's                            cardiovascular instability (arrhythmia). The                            quality of the bowel preparation was unsatisfactory. Scope In: Scope Out: Findings:                 The digital rectal exam findings include                            hemorrhoids. Pertinent negatives include no                            palpable rectal lesions.                           Non-thrombosed external and internal hemorrhoids  were found during retroflexion, during perianal                            exam and during digital exam. The hemorrhoids were                            Grade I (internal hemorrhoids that do not prolapse).                           Copious quantities of semi-liquid stool was found                            in the entire colon, interfering with                            visualization. Lavage of the area was performed                            using copious amounts, resulting in incomplete                            clearance with continued poor visualization. I                            attempted to try to get further into the colon to                            ensure that the patient did not have a mass/lesion                            that was easily identifiable but he then had                            bradycardia into the 20s from the normal 70-80s                            that he had been. We suctioned the minimal  air we                            had placed into the colon as the scope was being                            removed. His HR improved slowly. He had a small                            amount of bilious emesis occur for which suctioning                            was performed. Patient did not have any hypotension                            or hypoxemia at any time point during  the procedure.                           Due to the poor preparation and the patient's                            bradycardia event, decision made to not pursue any                            further endoscopic evaluation today. Complications:            Arrhythmia Estimated Blood Loss:     Estimated blood loss: none. Impression:               - Preparation of the colon was unsatisfactory.                           - Hemorrhoids found on digital rectal exam.                           - Stool in the entire examined colon. Attempted to                            lavage to ensure we would not be missing a large                            mass/lesion but could not get adequate                            visualization.                           - Non-thrombosed external and internal hemorrhoids.                           - The procedure was aborted due to the patient's                            arrthymia which improved as the scope was removed                            from the patient and overall poor preparation. Recommendation:           - Repeat colonoscopy next available for                            patient/family with 1 week of daily Miralax and                            2-days of preparation.                           - The findings and recommendations were discussed                            with the patient.                           -  The findings and recommendations were discussed                            with the patient's family. Justice Britain, MD 07/11/2019 10:24:19 AM

## 2019-07-11 NOTE — Progress Notes (Signed)
Called to room to assist during endoscopic procedure.  Patient ID and intended procedure confirmed with present staff. Received instructions for my participation in the procedure from the performing physician.Called to room to assist during endoscopic procedure.  Patient ID and intended procedure confirmed with present staff. Received instructions for my participation in the procedure from the performing physician. 

## 2019-07-11 NOTE — Progress Notes (Signed)
At 0959 pt suddenly went brady into the 20s.  Dr Jerilynn Mages alerted.  Atropine box opened but before it was connected Dr Jerilynn Mages pulled out and stopped procedure.  HR in 40s.  Started to draw up [place].  Dr Jerilynn Mages helped him release air and HR returned to 6s.  Pt then proceded to release a copius amount of fluid that looked like from colon prep.  (Pt stomach was clear from previous endoscopy).  Pt head dropped to steep t burg and suction readily available.  approx 100cc of the fluid was caught in cannister but much more on sheets.  Pt awake and swallowing, following commands, coughing all within a minute.  BS had wheezes in L lower lobe but all else clear initially

## 2019-07-11 NOTE — Progress Notes (Signed)
Pt's states no medical or surgical changes since previsit or office visit. DT vitals, JB Iv.  Pt took metformin this morning CBG was 109. Will notify recovery to monitor patient closely post procedure.

## 2019-07-15 ENCOUNTER — Telehealth: Payer: Self-pay | Admitting: *Deleted

## 2019-07-15 NOTE — Telephone Encounter (Signed)
°  Follow up Call-  Call back number 07/11/2019 05/16/2019  Post procedure Call Back phone  # [phone]  Permission to leave phone message Yes Yes  Some recent data might be hidden     Patient questions:  Do you have a fever, pain , or abdominal swelling? No. Pain Score  0 *  Have you tolerated food without any problems? Yes.    Have you been able to return to your normal activities? Yes.    Do you have any questions about your discharge instructions: Diet   No. Medications  No. Follow up visit  No.  Do you have questions or concerns about your Care? No.  Actions: * If pain score is 4 or above: No action needed, pain <4.  1. Have you developed a fever since your procedure? no  2.   Have you had an respiratory symptoms (SOB or cough) since your procedure? no  3.   Have you tested positive for COVID 19 since your procedure no  4.   Have you had any family members/close contacts diagnosed with the COVID 19 since your procedure?  no   If yes to any of these questions please route to Joylene John, RN and Erenest Rasher, RN

## 2019-07-16 ENCOUNTER — Encounter: Payer: Self-pay | Admitting: Gastroenterology

## 2019-07-26 ENCOUNTER — Ambulatory Visit (AMBULATORY_SURGERY_CENTER): Payer: Medicare HMO | Admitting: *Deleted

## 2019-07-26 ENCOUNTER — Other Ambulatory Visit: Payer: Self-pay

## 2019-07-26 VITALS — Ht 67.5 in | Wt 173.0 lb

## 2019-07-26 DIAGNOSIS — Z85038 Personal history of other malignant neoplasm of large intestine: Secondary | ICD-10-CM

## 2019-07-26 DIAGNOSIS — R634 Abnormal weight loss: Secondary | ICD-10-CM

## 2019-07-26 MED ORDER — PLENVU 140 G PO SOLR: 1.0000 | Freq: Once | ORAL | 0 refills | Status: AC

## 2019-07-26 NOTE — Progress Notes (Signed)
Patient is here in-person for PV. Patient denies any allergies to eggs or soy. Patient denies any problems with anesthesia/sedation. Patient denies any oxygen use at home. Patient denies taking any diet/weight loss medications or blood thinners. Patient is not being treated for MRSA or C-diff. Patient is aware of our care-partner policy and GZFPO-25 safety protocol. Completed  covid vaccines on 05/01/19. 2 day plenvu instructions given to pt, plenvu sample given to pt. instructed patient to take Miralax daily for one week before exam. Pt denies taking Pletal, I explained to patient for him to look in his medications to check for this med or generic name and to stop it 2 days before if he is taking this. Pt verbalizes understanding to all my prep instructions.

## 2019-08-05 ENCOUNTER — Ambulatory Visit (AMBULATORY_SURGERY_CENTER): Payer: Medicare HMO | Admitting: Internal Medicine

## 2019-08-05 ENCOUNTER — Encounter: Payer: Self-pay | Admitting: Internal Medicine

## 2019-08-05 ENCOUNTER — Other Ambulatory Visit: Payer: Self-pay

## 2019-08-05 VITALS — BP 128/72 | HR 61 | Temp 97.5°F | Resp 11 | Ht 67.0 in | Wt 173.0 lb

## 2019-08-05 DIAGNOSIS — Z1211 Encounter for screening for malignant neoplasm of colon: Secondary | ICD-10-CM | POA: Diagnosis not present

## 2019-08-05 DIAGNOSIS — K5730 Diverticulosis of large intestine without perforation or abscess without bleeding: Secondary | ICD-10-CM

## 2019-08-05 DIAGNOSIS — Z86010 Personal history of colonic polyps: Secondary | ICD-10-CM | POA: Diagnosis not present

## 2019-08-05 DIAGNOSIS — Z85038 Personal history of other malignant neoplasm of large intestine: Secondary | ICD-10-CM | POA: Diagnosis not present

## 2019-08-05 DIAGNOSIS — R634 Abnormal weight loss: Secondary | ICD-10-CM | POA: Diagnosis not present

## 2019-08-05 DIAGNOSIS — K649 Unspecified hemorrhoids: Secondary | ICD-10-CM

## 2019-08-05 MED ORDER — SODIUM CHLORIDE 0.9 % IV SOLN: 500.0000 mL | Freq: Once | INTRAVENOUS | Status: DC

## 2019-08-05 NOTE — Patient Instructions (Signed)
Handouts given :  Hemorrhoids, Diverticulosis Resume previous diet Continue present medications Resume Pletal today at usual dose No repeat colonoscopy due to age and the absence of colonic polyps   YOU HAD AN ENDOSCOPIC PROCEDURE TODAY AT [facility]:   Refer to the procedure report that was given to you for any specific questions about what was found during the examination.  If the procedure report does not answer your questions, please call your gastroenterologist to clarify.  If you requested that your care partner not be given the details of your procedure findings, then the procedure report has been included in a sealed envelope for you to review at your convenience later.  YOU SHOULD EXPECT: Some feelings of bloating in the abdomen. Passage of more gas than usual.  Walking can help get rid of the air that was put into your GI tract during the procedure and reduce the bloating. If you had a lower endoscopy (such as a colonoscopy or flexible sigmoidoscopy) you may notice spotting of blood in your stool or on the toilet paper. If you underwent a bowel prep for your procedure, you may not have a normal bowel movement for a few days.  Please Note:  You might notice some irritation and congestion in your nose or some drainage.  This is from the oxygen used during your procedure.  There is no need for concern and it should clear up in a day or so.  SYMPTOMS TO REPORT IMMEDIATELY:   Following lower endoscopy (colonoscopy or flexible sigmoidoscopy):  Excessive amounts of blood in the stool  Significant tenderness or worsening of abdominal pains  Swelling of the abdomen that is new, acute  Fever of 100F or higher   For urgent or emergent issues, a gastroenterologist can be reached at any hour by calling [phone]. Do not use MyChart messaging for urgent concerns.    DIET:  We do recommend a small meal at first, but then you may proceed to your regular diet.  Drink  plenty of fluids but you should avoid alcoholic beverages for 24 hours.  ACTIVITY:  You should plan to take it easy for the rest of today and you should NOT DRIVE or use heavy machinery until tomorrow (because of the sedation medicines used during the test).    FOLLOW UP: Our staff will call the number listed on your records 48-72 hours following your procedure to check on you and address any questions or concerns that you may have regarding the information given to you following your procedure. If we do not reach you, we will leave a message.  We will attempt to reach you two times.  During this call, we will ask if you have developed any symptoms of COVID 19. If you develop any symptoms (ie: fever, flu-like symptoms, shortness of breath, cough etc.) before then, please call [phone].  If you test positive for Covid 19 in the 2 weeks post procedure, please call and report this information to [place].    If any biopsies were taken you will be contacted by phone or by letter within the next 1-3 weeks.  Please call us at [phone] if you have not heard about the biopsies in 3 weeks.    SIGNATURES/CONFIDENTIALITY: You and/or your care partner have signed paperwork which will be entered into your electronic medical record.  These signatures attest to the fact that that the information above on your After Visit Summary has been reviewed and is understood.  Full  responsibility of the confidentiality of this discharge information lies with you and/or your care-partner.

## 2019-08-05 NOTE — Progress Notes (Signed)
Pt's states no medical or surgical changes since previsit or office visit. 

## 2019-08-05 NOTE — Progress Notes (Signed)
pt tolerated well. VSS. awake and to recovery. Report given to RN.  

## 2019-08-05 NOTE — Op Note (Signed)
[facility] Patient Name: Shaun Kennedy Procedure Date: 08/05/2019 3:24 PM MRN: [id number] Endoscopist: Jerene Bears , MD Age: 79 Referring MD:  Date of Birth: 1940-02-15 Gender: Male Account #: [id number] Procedure:                Colonoscopy Indications:              Screening for colorectal malignant neoplasm,                            incidental weight loss, incomplete/aborted                            colonoscopy earlier in June 2021 due to inadequate                            preparation Medicines:                Monitored Anesthesia Care Procedure:                Pre-Anesthesia Assessment:                           - Prior to the procedure, a History and Physical                            was performed, and patient medications and                            allergies were reviewed. The patient's tolerance of                            previous anesthesia was also reviewed. The risks                            and benefits of the procedure and the sedation                            options and risks were discussed with the patient.                            All questions were answered, and informed consent                            was obtained. Prior Anticoagulants: The patient has                            taken Pletal (cilostazol), last dose was 5 days                            prior to procedure. ASA Grade Assessment: III - A                            patient with severe systemic disease. After  reviewing the risks and benefits, the patient was                            deemed in satisfactory condition to undergo the                            procedure.                           After obtaining informed consent, the colonoscope                            was passed under direct vision. Throughout the                            procedure, the patient's blood pressure, pulse, and                            oxygen saturations were  monitored continuously. The                            Colonoscope was introduced through the anus and                            advanced to the cecum, identified by appendiceal                            orifice and ileocecal valve. The colonoscopy was                            performed without difficulty. The patient tolerated                            the procedure well. The quality of the bowel                            preparation was good. The ileocecal valve,                            appendiceal orifice, and rectum were photographed.                            The bowel preparation used was 2 day MiraLax +                            Plenvu via split dose instruction. Scope In: 3:32:53 PM Scope Out: 3:49:28 PM Scope Withdrawal Time: 0 hours 10 minutes 37 seconds  Total Procedure Duration: 0 hours 16 minutes 35 seconds  Findings:                 The digital rectal exam was normal.                           Multiple small and large-mouthed diverticula were  found in the sigmoid colon and descending colon.                           External and internal hemorrhoids were found during                            retroflexion. The hemorrhoids were medium-sized.                           The exam was otherwise without abnormality. Complications:            No immediate complications. Estimated Blood Loss:     Estimated blood loss: none. Impression:               - Moderate diverticulosis in the sigmoid colon and                            in the descending colon.                           - External and internal hemorrhoids.                           - The examination was otherwise normal.                           - No specimens collected. Recommendation:           - Patient has a contact number available for                            emergencies. The signs and symptoms of potential                            delayed complications were discussed with the                             patient. Return to normal activities tomorrow.                            Written discharge instructions were provided to the                            patient.                           - Resume previous diet.                           - Continue present medications. Resume Pletal today                            at usual dose.                           - No repeat colonoscopy due to age and the absence  of colonic polyps. Jerene Bears, MD 08/05/2019 3:54:43 PM This report has been signed electronically.

## 2019-08-06 ENCOUNTER — Telehealth: Payer: Self-pay

## 2019-08-06 ENCOUNTER — Encounter: Payer: Medicare HMO | Admitting: Gastroenterology

## 2019-08-06 DIAGNOSIS — R634 Abnormal weight loss: Secondary | ICD-10-CM

## 2019-08-06 NOTE — Telephone Encounter (Signed)
-----   Message from Shaun Kennedy., MD sent at 08/05/2019  7:47 PM EDT ----- Shaun Kennedy, Thanks. Agree with plan for CT-Chest/Abdomen/Pelvis. Shaun Kennedy, please proceed with ordering these scans. Unintentional weight loss s/p EGD/Colonoscopy with persistent weight loss. Further workup/management to be dictated by his PCP. Thanks. Shaun Kennedy ----- Message ----- From: Shaun Bears, MD Sent: 08/05/2019   4:44 PM EDT To: Shaun Lasso, RN, Shaun Kennedy., MD  Shaun Kennedy, I completed the repeat colonoscopy on Shaun Kennedy today.  It was unremarkable other than diverticulosis. Both he and his wife expressed concern about his 8 to 70 pound weight loss and ask about next steps I told him I would let you know  Maybe the neck step would be cross-sectional imaging of the chest abdomen pelvis just to evaluate weight loss Thanks Shaun Kennedy

## 2019-08-06 NOTE — Telephone Encounter (Signed)
CT chest abd pelvis on 08/16/19 at 130 pm at [facility]  Will need to pick up contrast at least 2 days prior to the app.   The pt wife has been advised of the CT scan information.  She is aware to pick up contrast at least 2 days prior to the appt.

## 2019-08-07 ENCOUNTER — Telehealth: Payer: Self-pay | Admitting: *Deleted

## 2019-08-07 NOTE — Telephone Encounter (Signed)
  Follow up Call-  Call back number 08/05/2019 07/11/2019 05/16/2019  Post procedure Call Back phone  # [phone]  Permission to leave phone message Yes Yes Yes  Some recent data might be hidden     Patient questions:  Do you have a fever, pain , or abdominal swelling? No. Pain Score  0 *  Have you tolerated food without any problems? Yes.    Have you been able to return to your normal activities? Yes.    Do you have any questions about your discharge instructions: Diet   No. Medications  No. Follow up visit  No.  Do you have questions or concerns about your Care? No.  Actions: * If pain score is 4 or above: No action needed, pain <4.  1. Have you developed a fever since your procedure? no  2.   Have you had an respiratory symptoms (SOB or cough) since your procedure? no  3.   Have you tested positive for COVID 19 since your procedure no  4.   Have you had any family members/close contacts diagnosed with the COVID 19 since your procedure?  no   If yes to any of these questions please route to Joylene John, RN and Erenest Rasher, RN

## 2019-08-08 DIAGNOSIS — H9313 Tinnitus, bilateral: Secondary | ICD-10-CM | POA: Diagnosis not present

## 2019-08-08 DIAGNOSIS — H903 Sensorineural hearing loss, bilateral: Secondary | ICD-10-CM | POA: Diagnosis not present

## 2019-08-13 ENCOUNTER — Telehealth: Payer: Self-pay | Admitting: Internal Medicine

## 2019-08-13 NOTE — Telephone Encounter (Signed)
Dr. Hilarie Fredrickson, This patient is scheduled for CT abd/pel/chest and has been denied for weight loss.  They are requiring more work up than has already been done (hormone levels, scope, barium swallow, stool test, etc) or peer to peer discussion [phone] option 4 [id number].  Copy of the fax denial has been placed on your desk. (Patty told me to send note to you, thanks a lot!)

## 2019-08-13 NOTE — Telephone Encounter (Signed)
I called the patient's insurer on behalf of Dr. Rush Landmark after learning that the CT abdomen and pelvis was denied It appears the CT chest has been approved The representative for the insurer with whom I spoke reported that they did not have information needed to approve specifically the upper endoscopy and colonoscopy which were performed in June. It is expected that once they have these records the test will be approved after further review  Please fax the upper endoscopy plus pathology records performed in the [facility] on 07/11/2019 as well as the colonoscopy performed in the [facility] on 08/05/2019 to the following fax number: [phone] Attention case number: [id number]

## 2019-08-14 ENCOUNTER — Ambulatory Visit (INDEPENDENT_AMBULATORY_CARE_PROVIDER_SITE_OTHER): Payer: Medicare HMO | Admitting: Family Medicine

## 2019-08-14 ENCOUNTER — Encounter: Payer: Self-pay | Admitting: Family Medicine

## 2019-08-14 ENCOUNTER — Other Ambulatory Visit: Payer: Self-pay

## 2019-08-14 VITALS — BP 130/76 | HR 90 | Temp 97.5°F | Wt 163.8 lb

## 2019-08-14 DIAGNOSIS — Z8546 Personal history of malignant neoplasm of prostate: Secondary | ICD-10-CM | POA: Diagnosis not present

## 2019-08-14 DIAGNOSIS — J309 Allergic rhinitis, unspecified: Secondary | ICD-10-CM | POA: Diagnosis not present

## 2019-08-14 DIAGNOSIS — E1121 Type 2 diabetes mellitus with diabetic nephropathy: Secondary | ICD-10-CM

## 2019-08-14 DIAGNOSIS — D649 Anemia, unspecified: Secondary | ICD-10-CM

## 2019-08-14 DIAGNOSIS — K4090 Unilateral inguinal hernia, without obstruction or gangrene, not specified as recurrent: Secondary | ICD-10-CM

## 2019-08-14 DIAGNOSIS — E1159 Type 2 diabetes mellitus with other circulatory complications: Secondary | ICD-10-CM | POA: Diagnosis not present

## 2019-08-14 DIAGNOSIS — R634 Abnormal weight loss: Secondary | ICD-10-CM | POA: Diagnosis not present

## 2019-08-14 DIAGNOSIS — E1169 Type 2 diabetes mellitus with other specified complication: Secondary | ICD-10-CM

## 2019-08-14 DIAGNOSIS — E119 Type 2 diabetes mellitus without complications: Secondary | ICD-10-CM | POA: Diagnosis not present

## 2019-08-14 DIAGNOSIS — E785 Hyperlipidemia, unspecified: Secondary | ICD-10-CM

## 2019-08-14 DIAGNOSIS — I152 Hypertension secondary to endocrine disorders: Secondary | ICD-10-CM

## 2019-08-14 DIAGNOSIS — I10 Essential (primary) hypertension: Secondary | ICD-10-CM

## 2019-08-14 LAB — POCT GLYCOSYLATED HEMOGLOBIN (HGB A1C): Hemoglobin A1C: 6.4 % — AB (ref 4.0–5.6)

## 2019-08-14 NOTE — Progress Notes (Signed)
Subjective:    Patient ID: Shaun Kennedy, male    DOB: 1940/03/14, 79 y.o.   MRN: [id number]  Shaun Kennedy is a 79 y.o. male who presents for follow-up of Type 2 diabetes mellitus.  Home blood sugar records: meter records, fasting, 87- 135 Current symptoms/problems include none at this time . Daily foot checks:yes   Any foot concerns: none Exercise: staying active Diet: Poor Per pt food does not taste the same. He continues on Metformin.  He states in the Marinol has had no effect on his eating habits.  Vesicare is helping with his OAB symptoms.  He does use Prilosec regularly.  His allergies seem to be under good control.  He does complain of slight rib pain especially when he tries to play golf and is therefore limited his golfing.  Does have a previous history of prostate cancer dating back to 2003.  He also complains of swelling in the right inguinal area. He is in the process of being evaluated by GI for his weight loss.  So far endoscopy and colonoscopy has been negative.  He is scheduled for chest abdomen and CT. The following portions of the patient's history were reviewed and updated as appropriate: allergies, current medications, past medical history, past social history and problem list.  ROS as in subjective above.     Objective:    Physical Exam Alert and in no distress, 4 inch pulsatile lesion noted in the right inguinal area.  Hemoglobin A1c is 6.4 Lab Review Diabetic Labs Latest Ref Rng & Units 04/02/2019 02/18/2019 01/08/2019 10/17/2018 10/11/2017  HbA1c 4.0 - 5.6 % - 6.6(A) - 6.4(A) 6.5(A)  Microalbumin mg/L - - - 129.6 >300.00  Micro/Creat Ratio - - - - 71.4 >262.0  Chol 100 - 199 mg/dL - - - 146 173  HDL >39 mg/dL - - - 73 88  Calc LDL 0 - 99 mg/dL - - - 57 70  Triglycerides 0 - 149 mg/dL - - - 87 75  Creatinine 0.76 - 1.27 mg/dL 1.30(H) - 1.24 1.24 1.20   BP/Weight 08/05/2019 07/26/2019 07/11/2019 07/04/2019 06/03/621  Systolic BP 762 - 831 - 517  Diastolic BP 72 - 82  - 78  Wt. (Lbs) 173 173 179 179.8 179  BMI 27.1 26.7 28.04 28.16 28.04   Foot/eye exam completion dates Latest Ref Rng & Units 01/24/2019 01/24/2019  Eye Exam No Retinopathy Retinopathy(A) No Retinopathy  Foot Form Completion - - -    Shaun Kennedy  reports that he quit smoking about 31 years ago. He has never used smokeless tobacco. He reports that he does not drink alcohol and does not use drugs.     Assessment & Plan:    Type 2 diabetes mellitus without complication, without long-term current use of insulin (HCC) - Plan: POCT glycosylated hemoglobin (Hb A1C), CBC with Differential/Platelet, Comprehensive metabolic panel  History of prostate cancer - Plan: PSA  Hyperlipidemia associated with type 2 diabetes mellitus ([place])  Hypertension associated with diabetes ([place])  Diabetic nephropathy associated with type 2 diabetes mellitus (HCC)  Allergic rhinitis, mild  Weight loss - Plan: CBC with Differential/Platelet, Comprehensive metabolic panel  Anemia, unspecified type - Plan: CBC with Differential/Platelet  Right inguinal hernia - Plan: Ambulatory referral to General Surgery  So far the etiology to his weight loss is not clear.  He has had a pretty decent work-up so will be interesting to see what CT scan shows.  I think it is reasonable to see a PSA is  to be on the safe side.

## 2019-08-14 NOTE — Telephone Encounter (Signed)
Faxed endo/colon report as well as path report to [phone]

## 2019-08-15 LAB — CBC WITH DIFFERENTIAL/PLATELET
Basophils Absolute: 0 10*3/uL (ref 0.0–0.2)
Basos: 1 %
EOS (ABSOLUTE): 0.3 10*3/uL (ref 0.0–0.4)
Eos: 4 %
Hematocrit: 30.1 % — ABNORMAL LOW (ref 37.5–51.0)
Hemoglobin: 9.8 g/dL — ABNORMAL LOW (ref 13.0–17.7)
Immature Grans (Abs): 0 10*3/uL (ref 0.0–0.1)
Immature Granulocytes: 0 %
Lymphocytes Absolute: 1.8 10*3/uL (ref 0.7–3.1)
Lymphs: 24 %
MCH: 28.3 pg (ref 26.6–33.0)
MCHC: 32.6 g/dL (ref 31.5–35.7)
MCV: 87 fL (ref 79–97)
Monocytes Absolute: 0.7 10*3/uL (ref 0.1–0.9)
Monocytes: 9 %
Neutrophils Absolute: 4.9 10*3/uL (ref 1.4–7.0)
Neutrophils: 62 %
Platelets: 549 10*3/uL — ABNORMAL HIGH (ref 150–450)
RBC: 3.46 x10E6/uL — ABNORMAL LOW (ref 4.14–5.80)
RDW: 11.8 % (ref 11.6–15.4)
WBC: 7.8 10*3/uL (ref 3.4–10.8)

## 2019-08-15 LAB — COMPREHENSIVE METABOLIC PANEL
ALT: 8 IU/L (ref 0–44)
AST: 13 IU/L (ref 0–40)
Albumin/Globulin Ratio: 0.8 — ABNORMAL LOW (ref 1.2–2.2)
Albumin: 3.7 g/dL (ref 3.7–4.7)
Alkaline Phosphatase: 64 IU/L (ref 48–121)
BUN/Creatinine Ratio: 12 (ref 10–24)
BUN: 16 mg/dL (ref 8–27)
Bilirubin Total: 0.6 mg/dL (ref 0.0–1.2)
CO2: 24 mmol/L (ref 20–29)
Calcium: 10 mg/dL (ref 8.6–10.2)
Chloride: 94 mmol/L — ABNORMAL LOW (ref 96–106)
Creatinine, Ser: 1.29 mg/dL — ABNORMAL HIGH (ref 0.76–1.27)
GFR calc Af Amer: 60 mL/min/{1.73_m2} (ref >59)
GFR calc non Af Amer: 52 mL/min/{1.73_m2} — ABNORMAL LOW (ref >59)
Globulin, Total: 4.4 g/dL (ref 1.5–4.5)
Glucose: 97 mg/dL (ref 65–99)
Potassium: 4.4 mmol/L (ref 3.5–5.2)
Sodium: 132 mmol/L — ABNORMAL LOW (ref 134–144)
Total Protein: 8.1 g/dL (ref 6.0–8.5)

## 2019-08-15 LAB — PSA: Prostate Specific Ag, Serum: <0.1 ng/mL (ref 0.0–4.0)

## 2019-08-16 ENCOUNTER — Emergency Department (HOSPITAL_COMMUNITY)
Admission: EM | Admit: 2019-08-16 | Discharge: 2019-08-16 | Disposition: A | Discharge status: Home / Self Care | Payer: Medicare HMO | Attending: Emergency Medicine | Admitting: Emergency Medicine

## 2019-08-16 ENCOUNTER — Other Ambulatory Visit: Payer: Self-pay

## 2019-08-16 ENCOUNTER — Telehealth: Payer: Self-pay | Admitting: Family Medicine

## 2019-08-16 ENCOUNTER — Ambulatory Visit (HOSPITAL_COMMUNITY): Payer: Medicare HMO

## 2019-08-16 ENCOUNTER — Encounter (HOSPITAL_COMMUNITY): Payer: Self-pay

## 2019-08-16 ENCOUNTER — Emergency Department (HOSPITAL_COMMUNITY): Payer: Medicare HMO

## 2019-08-16 DIAGNOSIS — I700 Atherosclerosis of aorta: Secondary | ICD-10-CM | POA: Diagnosis not present

## 2019-08-16 DIAGNOSIS — Z7984 Long term (current) use of oral hypoglycemic drugs: Secondary | ICD-10-CM | POA: Insufficient documentation

## 2019-08-16 DIAGNOSIS — Z87891 Personal history of nicotine dependence: Secondary | ICD-10-CM | POA: Insufficient documentation

## 2019-08-16 DIAGNOSIS — Z79899 Other long term (current) drug therapy: Secondary | ICD-10-CM | POA: Diagnosis not present

## 2019-08-16 DIAGNOSIS — R918 Other nonspecific abnormal finding of lung field: Secondary | ICD-10-CM | POA: Insufficient documentation

## 2019-08-16 DIAGNOSIS — J45909 Unspecified asthma, uncomplicated: Secondary | ICD-10-CM | POA: Diagnosis not present

## 2019-08-16 DIAGNOSIS — E119 Type 2 diabetes mellitus without complications: Secondary | ICD-10-CM | POA: Insufficient documentation

## 2019-08-16 DIAGNOSIS — R0602 Shortness of breath: Secondary | ICD-10-CM | POA: Diagnosis not present

## 2019-08-16 DIAGNOSIS — I10 Essential (primary) hypertension: Secondary | ICD-10-CM | POA: Diagnosis not present

## 2019-08-16 DIAGNOSIS — R05 Cough: Secondary | ICD-10-CM | POA: Insufficient documentation

## 2019-08-16 DIAGNOSIS — R634 Abnormal weight loss: Secondary | ICD-10-CM | POA: Diagnosis not present

## 2019-08-16 DIAGNOSIS — K5730 Diverticulosis of large intestine without perforation or abscess without bleeding: Secondary | ICD-10-CM | POA: Diagnosis not present

## 2019-08-16 DIAGNOSIS — M47814 Spondylosis without myelopathy or radiculopathy, thoracic region: Secondary | ICD-10-CM | POA: Diagnosis not present

## 2019-08-16 DIAGNOSIS — Z7982 Long term (current) use of aspirin: Secondary | ICD-10-CM | POA: Insufficient documentation

## 2019-08-16 DIAGNOSIS — R590 Localized enlarged lymph nodes: Secondary | ICD-10-CM | POA: Diagnosis not present

## 2019-08-16 DIAGNOSIS — M47816 Spondylosis without myelopathy or radiculopathy, lumbar region: Secondary | ICD-10-CM | POA: Diagnosis not present

## 2019-08-16 LAB — I-STAT CHEM 8, ED
BUN: 20 mg/dL (ref 8–23)
Calcium, Ion: 1.25 mmol/L (ref 1.15–1.40)
Chloride: 96 mmol/L — ABNORMAL LOW (ref 98–111)
Creatinine, Ser: 1.2 mg/dL (ref 0.61–1.24)
Glucose, Bld: 98 mg/dL (ref 70–99)
HCT: 28 % — ABNORMAL LOW (ref 39.0–52.0)
Hemoglobin: 9.5 g/dL — ABNORMAL LOW (ref 13.0–17.0)
Potassium: 4.3 mmol/L (ref 3.5–5.1)
Sodium: 136 mmol/L (ref 135–145)
TCO2: 28 mmol/L (ref 22–32)

## 2019-08-16 LAB — COMPREHENSIVE METABOLIC PANEL
ALT: 12 U/L (ref 0–44)
AST: 16 U/L (ref 15–41)
Albumin: 3.5 g/dL (ref 3.5–5.0)
Alkaline Phosphatase: 54 U/L (ref 38–126)
Anion gap: 12 (ref 5–15)
BUN: 20 mg/dL (ref 8–23)
CO2: 27 mmol/L (ref 22–32)
Calcium: 9.8 mg/dL (ref 8.9–10.3)
Chloride: 97 mmol/L — ABNORMAL LOW (ref 98–111)
Creatinine, Ser: 1.19 mg/dL (ref 0.61–1.24)
GFR calc Af Amer: >60 mL/min (ref >60)
GFR calc non Af Amer: 57 mL/min — ABNORMAL LOW (ref >60)
Glucose, Bld: 99 mg/dL (ref 70–99)
Potassium: 4.3 mmol/L (ref 3.5–5.1)
Sodium: 136 mmol/L (ref 135–145)
Total Bilirubin: 0.9 mg/dL (ref 0.3–1.2)
Total Protein: 8.9 g/dL — ABNORMAL HIGH (ref 6.5–8.1)

## 2019-08-16 LAB — CBC WITH DIFFERENTIAL/PLATELET
Abs Immature Granulocytes: 0.01 10*3/uL (ref 0.00–0.07)
Basophils Absolute: 0 10*3/uL (ref 0.0–0.1)
Basophils Relative: 1 %
Eosinophils Absolute: 0.2 10*3/uL (ref 0.0–0.5)
Eosinophils Relative: 3 %
HCT: 28.7 % — ABNORMAL LOW (ref 39.0–52.0)
Hemoglobin: 9.2 g/dL — ABNORMAL LOW (ref 13.0–17.0)
Immature Granulocytes: 0 %
Lymphocytes Relative: 32 %
Lymphs Abs: 2.2 10*3/uL (ref 0.7–4.0)
MCH: 28.5 pg (ref 26.0–34.0)
MCHC: 32.1 g/dL (ref 30.0–36.0)
MCV: 88.9 fL (ref 80.0–100.0)
Monocytes Absolute: 0.7 10*3/uL (ref 0.1–1.0)
Monocytes Relative: 10 %
Neutro Abs: 3.8 10*3/uL (ref 1.7–7.7)
Neutrophils Relative %: 54 %
Platelets: 509 10*3/uL — ABNORMAL HIGH (ref 150–400)
RBC: 3.23 MIL/uL — ABNORMAL LOW (ref 4.22–5.81)
RDW: 12 % (ref 11.5–15.5)
WBC: 7 10*3/uL (ref 4.0–10.5)
nRBC: 0 % (ref 0.0–0.2)

## 2019-08-16 LAB — TROPONIN I (HIGH SENSITIVITY): Troponin I (High Sensitivity): 7 ng/L (ref <18)

## 2019-08-16 MED ORDER — IOHEXOL 300 MG/ML  SOLN
100.0000 mL | Freq: Once | INTRAMUSCULAR | Status: AC | PRN
  Administered 2019-08-16: 100 mL via INTRAVENOUS

## 2019-08-16 NOTE — Telephone Encounter (Signed)
Wife called, pt coughing, shortness of breath, trouble being able to cough & some trouble breathing, ribs hurt. She very concerned.   No fever that she knows of.  Spoke with Dr. Redmond School he recommends going to ER.  Wife agrees with plan.

## 2019-08-16 NOTE — Discharge Instructions (Addendum)
Please call Dr. Gustavus Bryant office to schedule a lung biopsy for your lung mass. They will then refer you to the oncologist office.   If you feel short of breath or have any new symptom arise, please return to your closest emergency department. Please call your primary care's office next week to inform them of your visit.

## 2019-08-16 NOTE — ED Triage Notes (Signed)
Patient states that he had his esophagus stretched and states that he has been having a frequent cough since then. Patient states he does not have any problems swallowing.

## 2019-08-16 NOTE — ED Notes (Signed)
Patient transported to CT 

## 2019-08-16 NOTE — ED Provider Notes (Signed)
Shaun Kennedy   CSN: [id number] Arrival date & time: 08/16/19  1305     History Chief Complaint  Patient presents with  . Cough    Shaun Kennedy is a 79 y.o. male.  Shaun Kennedy is an 79 y/o M who presents to the ED with a cough for the past 2 week since have a endoscopy. Shaun Kennedy notes the cough began 4 days after the endoscopy procedure, which occurred without complications. He notes the cough is sporadic in nature. He states it is a dry cough, denies it being productive. Denies coughing up any blood. The cough is not worsened or brought on by anything in particular and improves when he drinks water. He states the coughing episodes can last up to 15 minutes.   He denies any recent illness, fever, chills, nausea, vomiting, diarrhea, abdominal pain, difficulty passing his bowels, blood in his stools, shortness of breath, chest pain. Shaun. Kennedy has no other complaints at time of my examination.   Shaun. Kennedy has received an endoscopy/colonoscopy for unexplained weight loss. His wife states he has lost 50 lbs in the past few months.   The history is provided by the patient.  Cough Cough characteristics:  Non-productive and dry Severity:  Mild Onset quality:  Sudden Timing:  Sporadic Progression:  Unchanged Chronicity:  New Smoker: no   Context comment:  Recent Endoscopy Exacerbated by: Dr[place]. Associated symptoms: no chest pain, no chills, no fever and no shortness of breath        Past Medical History:  Diagnosis Date  . Allergic rhinitis   . Asthma   . Carotid stenosis   . Colon cancer ([place]) 2003  . Diabetes mellitus ([place])   . Diverticulosis   . Dyslipidemia   . ED (erectile dysfunction)   . GERD (gastroesophageal reflux disease)   . H/O degenerative disc disease   . Hemorrhoids   . HTN (hypertension)   . Hyperlipidemia   . LVH (left ventricular hypertrophy)    on EKG  . Smoker    former    Patient Active  Problem List   Diagnosis Date Noted  . Atherosclerosis of aorta ([place]) 04/09/2019  . Carpal tunnel syndrome, left upper limb 02/19/2019  . History of carpal tunnel surgery of right wrist 12/21/2018  . OAB (overactive bladder) 10/11/2017  . Diabetic nephropathy associated with type 2 diabetes mellitus ([place]) 10/11/2017  . Onychomycosis of multiple toenails with type 2 diabetes mellitus ([place]) 07/22/2015  . Former smoker, stopped smoking in distant past 07/22/2015  . Diabetes mellitus ([place]) 06/13/2011  . ED (erectile dysfunction) 06/13/2011  . History of prostate cancer 06/13/2011  . Hypertension associated with diabetes ([place]) 06/13/2011  . Hyperlipidemia associated with type 2 diabetes mellitus ([place]) 06/13/2011  . Allergic rhinitis, mild 06/13/2011  . History of asthma 06/13/2011  . Carotid stenosis 06/13/2011    Past Surgical History:  Procedure Laterality Date  . CARPAL TUNNEL RELEASE Right 01/09/2019   Procedure: CARPAL TUNNEL RELEASE;  Surgeon: Leandrew Koyanagi, MD;  Location: [place];  Service: Orthopedics;  Laterality: Right;  . CATARACT EXTRACTION Right 2018  . COLONOSCOPY  2007   Dr. Benson Norway  . I & D EXTREMITY Right 04/02/2016   Procedure: IRRIGATION AND DEBRIDEMENT GREAT TOE;  Surgeon: Leandrew Koyanagi, MD;  Location: [place];  Service: Orthopedics;  Laterality: Right;  . RADIOACTIVE SEED IMPLANT  2003  . ULNAR TUNNEL RELEASE Right 01/09/2019   Procedure: RIGHT  CUBITAL TUNNEL RELEASE AND CARPAL TUNNEL RELEASE;  Surgeon: Leandrew Koyanagi, MD;  Location: [place];  Service: Orthopedics;  Laterality: Right;  . UPPER GASTROINTESTINAL ENDOSCOPY         Family History  Problem Relation Age of Onset  . Heart failure Mother   . Diabetes Mother   . Stroke Father   . Diabetes Father   . Diabetes Sister   . Diabetes Sister   . Lung cancer Sister   . Colon cancer Neg Hx   . Esophageal cancer Neg Hx   . Rectal cancer Neg Hx   . Colon polyps Neg Hx   . Stomach  cancer Neg Hx     Social History   Tobacco Use  . Smoking status: Former Smoker    Quit date: 10/09/1987    Years since quitting: 31.8  . Smokeless tobacco: Never Used  Vaping Use  . Vaping Use: Never used  Substance Use Topics  . Alcohol use: No  . Drug use: No    Home Medications Prior to Admission medications   Medication Sig Start Date End Date Taking? Authorizing Provider  amLODipine (NORVASC) 5 MG tablet Take 1 tablet (5 mg total) by mouth daily. 10/17/18   [name], MD  aspirin EC 81 MG tablet Take 81 mg by mouth daily.    [provider]  Blood Glucose Monitoring Suppl (ONETOUCH VERIO) w/Device KIT USE TO CHECK SUGAR DAILY 02/21/17   [name], MD  Blood Glucose Monitoring Suppl [facility] VERIO) w/Device KIT USE TO CHECK SUGAR DAILY 03/27/18   [name], MD  cholecalciferol (VITAMIN D) 1000 units tablet Take 1,000 Units by mouth daily.    [provider]  cilostazol (PLETAL) 100 MG tablet Take 1 tablet (100 mg total) by mouth 2 (two) times daily before a meal. 10/17/18   [name], MD  diclofenac (VOLTAREN) 75 MG EC tablet 1 po bid prn Patient not taking: Reported on 08/14/2019 10/30/18   Aundra Dubin, PA-C  dronabinol (MARINOL) 2.5 MG capsule Take 1 capsule (2.5 mg total) by mouth 2 (two) times daily before a meal. 06/18/19   [name], MD  HYDROcodone-acetaminophen (NORCO) 7.5-325 MG tablet Take 1-2 tablets by mouth 3 (three) times daily as needed for moderate pain. Patient not taking: Reported on 08/14/2019 01/09/19   Leandrew Koyanagi, MD  Lancets [facility] DELICA PLUS [id number]) MISC PATIENT IS TO TEST TWO TIMES A DAY DX: E11.9 (ONETOUCH VERIO FLEX) 04/23/19   [name], MD  linaclotide [facility]) 72 MCG capsule Take 1 capsule (72 mcg total) by mouth daily before breakfast. Take on an empty stomach. Patient not taking: Reported on 08/14/2019 04/11/19   Willia Craze, NP  lisinopril-hydrochlorothiazide (ZESTORETIC) 20-12.5 MG tablet  Take 1 tablet by mouth daily. 10/17/18   [name], MD  metFORMIN (GLUCOPHAGE-XR) 500 MG 24 hr tablet TAKE 1 TABLET BY MOUTH EVERY DAY 07/05/19   [name], MD  Multiple Vitamin (MULTIVITAMIN WITH MINERALS) TABS tablet Take 1 tablet by mouth daily.    [provider]  omeprazole (PRILOSEC) 40 MG capsule Take 1 capsule (40 mg total) by mouth 2 (two) times daily. Patient not taking: Reported on 08/14/2019 05/16/19   Mansouraty, Telford Nab., MD  ondansetron (ZOFRAN) 4 MG tablet Take 1-2 tablets (4-8 mg total) by mouth every 8 (eight) hours as needed for nausea or vomiting. Patient not taking: Reported on 08/14/2019 01/09/19   Frankey Shown  M, MD  [facility] VERIO test strip USE AS INSTRUCTED TO CHECK TWICE DAILY 09/17/18   [name], MD  simvastatin (ZOCOR) 40 MG tablet Take 1 tablet (40 mg total) by mouth daily. 10/17/18   [name], MD  solifenacin (VESICARE) 10 MG tablet Take 1 tablet (10 mg total) by mouth daily. 02/18/19   [name], MD    Allergies    Patient has no known allergies.  Review of Systems   Review of Systems  Constitutional: Negative for chills and fever.  Respiratory: Positive for cough. Negative for shortness of breath.   Cardiovascular: Negative for chest pain.  Gastrointestinal: Negative for abdominal pain, blood in stool, diarrhea, nausea and vomiting.  Genitourinary: Negative for difficulty urinating.  All other systems reviewed and are negative.   Physical Exam Updated Vital Signs BP (!) 163/84 (BP Location: Right Arm)   Pulse 88   Temp 98.3 F (36.8 C) (Oral)   Resp 20   Ht '5\' 7"'  (1.702 m)   Wt 74.3 kg   SpO2 96%   BMI 25.65 kg/m   Physical Exam Vitals and nursing Kennedy reviewed.  Constitutional:      General: He is not in acute distress.    Appearance: Normal appearance. He is normal weight. He is not ill-appearing or toxic-appearing.  HENT:     Head: Atraumatic.     Nose: Nose normal.  Cardiovascular:     Rate and Rhythm:  Normal rate and regular rhythm.     Pulses: Normal pulses.     Heart sounds: Normal heart sounds. No murmur heard.  No friction rub. No gallop.   Pulmonary:     Effort: Pulmonary effort is normal. No respiratory distress.     Breath sounds: Normal breath sounds. No stridor. No wheezing, rhonchi or rales.  Abdominal:     General: Abdomen is flat. Bowel sounds are normal. There is no distension.     Palpations: Abdomen is soft.     Tenderness: There is no abdominal tenderness. There is no guarding.  Musculoskeletal:     Right lower leg: No edema.     Left lower leg: No edema.  Skin:    General: Skin is warm and dry.  Neurological:     General: No focal deficit present.     Mental Status: He is alert and oriented to person, place, and time. Mental status is at baseline.  Psychiatric:        Mood and Affect: Mood normal.        Behavior: Behavior normal.     ED Results / Procedures / Treatments   Labs (all labs ordered are listed, but only abnormal results are displayed) Labs Reviewed  CBC WITH DIFFERENTIAL/PLATELET - Abnormal; Notable for the following components:      Result Value   RBC 3.23 (*)    Hemoglobin 9.2 (*)    HCT 28.7 (*)    Platelets 509 (*)    All other components within normal limits  COMPREHENSIVE METABOLIC PANEL - Abnormal; Notable for the following components:   Chloride 97 (*)    Total Protein 8.9 (*)    GFR calc non Af Amer 57 (*)    All other components within normal limits  I-STAT CHEM 8, ED - Abnormal; Notable for the following components:   Chloride 96 (*)    Hemoglobin 9.5 (*)    HCT 28.0 (*)    All other components within normal limits  TROPONIN I (HIGH  SENSITIVITY)    EKG EKG Interpretation  Date/Time:  Friday August 16 2019 19:02:53 EDT Ventricular Rate:  94 PR Interval:    QRS Duration: 103 QT Interval:  370 QTC Calculation: 463 R Axis:   55 Text Interpretation: Sinus rhythm Prolonged PR interval Low voltage, extremity leads Baseline  wander in lead(s) II III aVL aVF No significant change since last tracing Confirmed by Wandra Arthurs [phone]) on 08/16/2019 7:07:24 PM   Radiology DG Chest 2 View  Result Date: 08/16/2019 CLINICAL DATA:  Shortness of breath, reported esophageal dilation 2 weeks prior EXAM: CHEST - 2 VIEW COMPARISON:  Radiograph 02/04/2015 FINDINGS: Focal masslike opacity in the left perihilar region, could reflect inflection or sequela of aspiration given recent esophageal manipulation however an underlying neoplasm is not excluded given these contours. No pneumothorax or effusion. No convincing features of edema. No acute osseous or soft tissue abnormality. Degenerative changes are present in the imaged spine and shoulders. Telemetry leads overlie the chest. IMPRESSION: Focal masslike opacity in the left perihilar region, could reflect inflection or sequela of aspiration given recent esophageal manipulation however an underlying neoplasm is not excluded given these contours. Recommend further evaluation with contrast enhanced CT. Electronically Signed   By: Lovena Le M.D.   On: 08/16/2019 19:36   CT Chest W Contrast  Result Date: 08/16/2019 CLINICAL DATA:  Chest pain and shortness of breath. EXAM: CT CHEST, ABDOMEN, AND PELVIS WITH CONTRAST TECHNIQUE: Multidetector CT imaging of the chest, abdomen and pelvis was performed following the standard protocol during bolus administration of intravenous contrast. CONTRAST:  111m OMNIPAQUE IOHEXOL 300 MG/ML  SOLN COMPARISON:  None. FINDINGS: CT CHEST FINDINGS Cardiovascular: No significant vascular findings. Normal heart size. No pericardial effusion. Mediastinum/Nodes: Necrotic pretracheal subcarinal and AP window lymph nodes are seen (the largest measures approximately 2.3 cm x 1.8 cm). Lungs/Pleura: A 6.7 cm x 4.8 cm heterogeneous low-attenuation lung mass is seen within the anteromedial aspect of the left lower lobe. This is most prominent within the region posterior to the left  hilum. Numerous bilateral noncalcified lung nodules of various sizes are seen. There is no evidence of acute infiltrate, pleural effusion or pneumothorax. Musculoskeletal: Multilevel degenerative changes seen throughout the thoracic spine. CT ABDOMEN PELVIS FINDINGS Hepatobiliary: No focal liver abnormality is seen. No gallstones, gallbladder wall thickening, or biliary dilatation. Pancreas: Unremarkable. No pancreatic ductal dilatation or surrounding inflammatory changes. Spleen: Normal in size without focal abnormality. Adrenals/Urinary Tract: Adrenal glands are unremarkable. Kidneys are normal in size, without renal calculi or hydronephrosis. A 1.6 cm simple cyst is seen within the anterior aspect of the mid right kidney. Bladder is unremarkable. Stomach/Bowel: Stomach is within normal limits. The appendix is not clearly identified. No evidence of bowel wall thickening, distention, or inflammatory changes. Noninflamed diverticula are seen throughout the sigmoid colon. Vascular/Lymphatic: There is moderate severity calcification of the abdominal aorta. No enlarged abdominal or pelvic lymph nodes. Reproductive: Prostate radiation implantation seeds are seen with a moderately enlarged prostate gland. Other: No abdominal wall hernia or abnormality. No abdominopelvic ascites. Musculoskeletal: Multilevel degenerative changes seen throughout the lumbar spine. IMPRESSION: 1. 6.7 cm x 4.8 cm heterogeneous low-attenuation left lower lobe lung mass consistent with primary lung malignancy. 2. Numerous bilateral noncalcified lung nodules of various sizes, consistent with metastatic disease. 3. Necrotic pretracheal, subcarinal and AP window lymph nodes. 4. Prostate radiation implantation seeds with a moderately enlarged prostate gland. 5. Noninflamed sigmoid diverticulosis. 6. Aortic atherosclerosis. Aortic Atherosclerosis (ICD10-I70.0). Electronically Signed   By: THoover Browns  [place] M.D.   On: 08/16/2019 20:33   CT ABDOMEN  PELVIS W CONTRAST  Result Date: 08/16/2019 CLINICAL DATA:  Chest pain and shortness of breath. EXAM: CT CHEST, ABDOMEN, AND PELVIS WITH CONTRAST TECHNIQUE: Multidetector CT imaging of the chest, abdomen and pelvis was performed following the standard protocol during bolus administration of intravenous contrast. CONTRAST:  161m OMNIPAQUE IOHEXOL 300 MG/ML  SOLN COMPARISON:  None. FINDINGS: Cardiovascular: No significant vascular findings. Normal heart size. No pericardial effusion. Mediastinum/Nodes: Necrotic pretracheal subcarinal and AP window lymph nodes are seen (the largest measures approximately 2.3 cm x 1.8 cm). Lungs/Pleura: A 6.7 cm x 4.8 cm heterogeneous low-attenuation lung mass is seen within the anteromedial aspect of the left lower lobe. This is most prominent within the region posterior to the left hilum. Numerous bilateral noncalcified lung nodules of various sizes are seen. There is no evidence of acute infiltrate, pleural effusion or pneumothorax. Musculoskeletal: Multilevel degenerative changes seen throughout the thoracic spine. CT ABDOMEN PELVIS FINDINGS Hepatobiliary: No focal liver abnormality is seen. No gallstones, gallbladder wall thickening, or biliary dilatation. Pancreas: Unremarkable. No pancreatic ductal dilatation or surrounding inflammatory changes. Spleen: Normal in size without focal abnormality. Adrenals/Urinary Tract: Adrenal glands are unremarkable. Kidneys are normal in size, without renal calculi or hydronephrosis. A 1.6 cm simple cyst is seen within the anterior aspect of the mid right kidney. Bladder is unremarkable. Stomach/Bowel: Stomach is within normal limits. The appendix is not clearly identified. No evidence of bowel wall thickening, distention, or inflammatory changes. Noninflamed diverticula are seen throughout the sigmoid colon. Vascular/Lymphatic: There is moderate severity calcification of the abdominal aorta. No enlarged abdominal or pelvic lymph nodes.  Reproductive: Prostate radiation implantation seeds are seen with a moderately enlarged prostate gland. Other: No abdominal wall hernia or abnormality. No abdominopelvic ascites. Musculoskeletal: Multilevel degenerative changes seen throughout the lumbar spine. IMPRESSION: 1. 6.7 cm x 4.8 cm heterogeneous low-attenuation left lower lobe lung mass consistent with primary lung malignancy. 2. Numerous bilateral noncalcified lung nodules of various sizes, consistent with metastatic disease. 3. Necrotic pretracheal, subcarinal and AP window lymph nodes. 4. Prostate radiation implantation seeds with a moderately enlarged prostate gland. 5. Noninflamed sigmoid diverticulosis. 6. Aortic atherosclerosis. Aortic Atherosclerosis (ICD10-I70.0). Electronically Signed   By: TVirgina NorfolkM.D.   On: 08/16/2019 20:33    Procedures Procedures (including critical care time)  Medications Ordered in ED Medications  iohexol (OMNIPAQUE) 300 MG/ML solution 100 mL (100 mLs Intravenous Contrast Given 08/16/19 2006)    ED Course  I have reviewed the triage vital signs and the nursing notes.  Pertinent labs & imaging results that were available during my care of the patient were reviewed by me and considered in my medical decision making (see chart for details).    MDM Rules/Calculators/A&P                           Shaun Kennedy an 79y/o M who presents to the ED today with a dry, non-productive cough for the past two weeks since being intubated for a colonoscopy procedure. Shaun. OFullhas had increasing weigh loss the past few months and has a PAN CT ordered in 6 days.   2015 Patient's X-Ray revealed mass like opacity. Because of x-ray finding, CT Chest/Abdomen/Pelvis Ordered.   Results of CT reveal 6.7 cm x 4.8 cm heterogeneous low-attenuation left lower lobe lung mass consistent with primary lung malignancy as well as numerous bilateral noncalcified lung nodules of  various sizes, consistent with metastatic  disease.  Patient's oxygen saturation is stable during visit and he does not require admission at this time.   Discussed case with oncologist on call, Dr. Heath Lark MD. Recommended patient follow up with Dr. Mertha Finders for his lung biopsy and then they will refer the patient to oncology.   Discussed plan with the patient who is in agreement with the plan and will call his primary on Monday as well as Dr. Mertha Finders for follow up.   Final Clinical Impression(s) / ED Diagnoses Final diagnoses:  Lung mass    Rx / DC Orders ED Discharge Orders    None       Riesa Pope, MD 08/16/19 2140    Drenda Freeze, MD 08/19/19 1546

## 2019-08-19 ENCOUNTER — Ambulatory Visit: Payer: Medicare HMO | Admitting: Emergency Medicine

## 2019-08-19 ENCOUNTER — Other Ambulatory Visit: Payer: Self-pay

## 2019-08-19 ENCOUNTER — Other Ambulatory Visit (HOSPITAL_COMMUNITY)
Admission: RE | Admit: 2019-08-19 | Discharge: 2019-08-19 | Disposition: A | Discharge status: Home / Self Care | Payer: Medicare HMO | Source: Ambulatory Visit | Attending: Emergency Medicine | Admitting: Emergency Medicine

## 2019-08-19 ENCOUNTER — Encounter: Payer: Self-pay | Admitting: *Deleted

## 2019-08-19 ENCOUNTER — Encounter (HOSPITAL_COMMUNITY): Payer: Self-pay | Admitting: Emergency Medicine

## 2019-08-19 ENCOUNTER — Telehealth: Payer: Self-pay | Admitting: Internal Medicine

## 2019-08-19 ENCOUNTER — Encounter: Payer: Self-pay | Admitting: Emergency Medicine

## 2019-08-19 VITALS — BP 130/80 | HR 94 | Temp 98.6°F | Ht 67.5 in | Wt 161.8 lb

## 2019-08-19 DIAGNOSIS — R634 Abnormal weight loss: Secondary | ICD-10-CM

## 2019-08-19 DIAGNOSIS — Z20822 Contact with and (suspected) exposure to covid-19: Secondary | ICD-10-CM | POA: Insufficient documentation

## 2019-08-19 DIAGNOSIS — R918 Other nonspecific abnormal finding of lung field: Secondary | ICD-10-CM

## 2019-08-19 DIAGNOSIS — Z01812 Encounter for preprocedural laboratory examination: Secondary | ICD-10-CM | POA: Diagnosis not present

## 2019-08-19 LAB — SARS CORONAVIRUS 2 (TAT 6-24 HRS): SARS Coronavirus 2: NEGATIVE

## 2019-08-19 NOTE — Telephone Encounter (Signed)
I spoke with the pt today and he had no concerns or questions for our office at this time.

## 2019-08-19 NOTE — Telephone Encounter (Signed)
The pt had the CT scans on Friday night. The appt has been cancelled.

## 2019-08-19 NOTE — Progress Notes (Signed)
I received referral today on [name].  I updated new patient coordinator to call and schedule him to be seen this Friday with Dr. Julien Nordmann.

## 2019-08-19 NOTE — H&P (View-Only) (Signed)
Subjective:    Patient ID: Shaun Kennedy, male    DOB: [birth year], 79 y.o.   MRN: [id number]  HPI 79 year old former smoker (25 pack years) with a history of allergic rhinitis, asthma, prostate cancer, diabetes.   He was seen in the emergency department on 08/16/2019 for shortness of breath and cough.  The cough started about 2-3 weeks ago. No hemoptysis. He has lost 80 lbs over the last year, unexplained. He is off his Pletal, last took it 2 weeks ago.   A CT scan of his chest and abdomen were performed on 7/9 which I have reviewed, shows a 6.7 x 4.8 cm heterogeneous left lower lobe medial mass with numerous bilateral noncalcified nodules.  Also mediastinal, subcarinal lymphadenopathy.   Review of Systems As per HPI  Past Medical History:  Diagnosis Date  . Allergic rhinitis   . Asthma   . Carotid stenosis   . Colon cancer ([place]) 2003  . Diabetes mellitus ([place])   . Diverticulosis   . Dyslipidemia   . ED (erectile dysfunction)   . GERD (gastroesophageal reflux disease)   . H/O degenerative disc disease   . Hemorrhoids   . HTN (hypertension)   . Hyperlipidemia   . LVH (left ventricular hypertrophy)    on EKG  . Smoker    former     Family History  Problem Relation Age of Onset  . Heart failure Mother   . Diabetes Mother   . Stroke Father   . Diabetes Father   . Diabetes Sister   . Diabetes Sister   . Lung cancer Sister   . Colon cancer Neg Hx   . Esophageal cancer Neg Hx   . Rectal cancer Neg Hx   . Colon polyps Neg Hx   . Stomach cancer Neg Hx      Social History   Socioeconomic History  . Marital status: Married    Spouse name: Not on file  . Number of children: 3  . Years of education: Not on file  . Highest education level: Not on file  Occupational History  . Occupation: retired  Tobacco Use  . Smoking status: Former Smoker    Quit date: 10/09/1987    Years since quitting: 31.8  . Smokeless tobacco: Never Used  Vaping Use  . Vaping Use: Never used   Substance and Sexual Activity  . Alcohol use: No  . Drug use: No  . Sexual activity: Yes  Other Topics Concern  . Not on file  Social History Narrative   Married, no pets   Social Determinants of Health   Financial Resource Strain:   . Difficulty of Paying Living Expenses:   Food Insecurity:   . Worried About Charity fundraiser in the Last Year:   . Arboriculturist in the Last Year:   Transportation Needs:   . Film/video editor (Medical):   Marland Kitchen Lack of Transportation (Non-Medical):   Physical Activity:   . Days of Exercise per Week:   . Minutes of Exercise per Session:   Stress:   . Feeling of Stress :   Social Connections:   . Frequency of Communication with Friends and Family:   . Frequency of Social Gatherings with Friends and Family:   . Attends Religious Services:   . Active Member of Clubs or Organizations:   . Attends Archivist Meetings:   Marland Kitchen Marital Status:   Intimate Partner Violence:   . Fear of Current or  Ex-Partner:   . Emotionally Abused:   Marland Kitchen Physically Abused:   . Sexually Abused:      No Known Allergies   Outpatient Medications Prior to Visit  Medication Sig Dispense Refill  . aspirin EC 81 MG tablet Take 81 mg by mouth daily.    . Blood Glucose Monitoring Suppl (ONETOUCH VERIO) w/Device KIT USE TO CHECK SUGAR DAILY 1 kit 0  . Blood Glucose Monitoring Suppl (ONETOUCH VERIO) w/Device KIT USE TO CHECK SUGAR DAILY 1 kit 0  . cholecalciferol (VITAMIN D) 1000 units tablet Take 1,000 Units by mouth daily.    . diclofenac (VOLTAREN) 75 MG EC tablet 1 po bid prn 30 tablet 0  . Lancets (ONETOUCH DELICA PLUS [id number]) MISC PATIENT IS TO TEST TWO TIMES A DAY DX: E11.9 (ONETOUCH VERIO FLEX) 100 each 12  . lisinopril-hydrochlorothiazide (ZESTORETIC) 20-12.5 MG tablet Take 1 tablet by mouth daily. 90 tablet 2  . metFORMIN (GLUCOPHAGE-XR) 500 MG 24 hr tablet TAKE 1 TABLET BY MOUTH EVERY DAY 90 tablet 1  . Multiple Vitamin (MULTIVITAMIN WITH MINERALS)  TABS tablet Take 1 tablet by mouth daily.    Glory Rosebush VERIO test strip USE AS INSTRUCTED TO CHECK TWICE DAILY 200 strip 5  . simvastatin (ZOCOR) 40 MG tablet Take 1 tablet (40 mg total) by mouth daily. 90 tablet 3  . solifenacin (VESICARE) 10 MG tablet Take 1 tablet (10 mg total) by mouth daily. 30 tablet 5  . amLODipine (NORVASC) 5 MG tablet Take 1 tablet (5 mg total) by mouth daily. 90 tablet 3  . cilostazol (PLETAL) 100 MG tablet Take 1 tablet (100 mg total) by mouth 2 (two) times daily before a meal. (Patient not taking: Reported on 08/19/2019) 180 tablet 3  . dronabinol (MARINOL) 2.5 MG capsule Take 1 capsule (2.5 mg total) by mouth 2 (two) times daily before a meal. (Patient not taking: Reported on 08/19/2019) 30 capsule 1  . HYDROcodone-acetaminophen (NORCO) 7.5-325 MG tablet Take 1-2 tablets by mouth 3 (three) times daily as needed for moderate pain. (Patient not taking: Reported on 08/14/2019) 30 tablet 0  . linaclotide (LINZESS) 72 MCG capsule Take 1 capsule (72 mcg total) by mouth daily before breakfast. Take on an empty stomach. (Patient not taking: Reported on 08/14/2019) 30 capsule 3  . omeprazole (PRILOSEC) 40 MG capsule Take 1 capsule (40 mg total) by mouth 2 (two) times daily. (Patient not taking: Reported on 08/14/2019) 90 capsule 3  . ondansetron (ZOFRAN) 4 MG tablet Take 1-2 tablets (4-8 mg total) by mouth every 8 (eight) hours as needed for nausea or vomiting. (Patient not taking: Reported on 08/14/2019) 40 tablet 0   Facility-Administered Medications Prior to Visit  Medication Dose Route Frequency Provider Last Rate Last Admin  . 0.9 %  sodium chloride infusion  500 mL Intravenous Once Pyrtle, Lajuan Lines, MD            Objective:   Physical Exam  Vitals:   08/19/19 1346  BP: 130/80  Pulse: 94  Temp: 98.6 F (37 C)  TempSrc: Oral  SpO2: 99%  Weight: 161 lb 12.8 oz (73.4 kg)  Height: 5' 7.5" (1.715 m)   Gen: Pleasant, well-nourished, in no distress,  normal affect  ENT: No  lesions,  mouth clear,  oropharynx clear, no postnasal drip  Neck: No JVD, no stridor  Lungs: No use of accessory muscles, no crackles or wheezing on normal respiration, decreased on the L  Cardiovascular: RRR, heart sounds normal, no murmur or  gallops, no peripheral edema  Musculoskeletal: No deformities, no cyanosis or clubbing  Neuro: alert, awake, non focal  Skin: Warm, no lesions or rash      Assessment & Plan:  Mass of left lung In a former smoker, also with history of prostate cancer.  Suspect that this is primary lung cancer.  He has mediastinal lymphadenopathy.  I think he will have an endobronchial lesion based on CT evaluation.  He needs bronchoscopy and EBUS ASAP.  I will try to arrange for tomorrow 7/13.  Baltazar Apo, MD, PhD 08/19/2019, 2:24 PM [facility] [phone] or if no answer [phone]

## 2019-08-19 NOTE — Assessment & Plan Note (Signed)
In a former smoker, also with history of prostate cancer.  Suspect that this is primary lung cancer.  He has mediastinal lymphadenopathy.  I think he will have an endobronchial lesion based on CT evaluation.  He needs bronchoscopy and EBUS ASAP.  I will try to arrange for tomorrow 7/13.

## 2019-08-19 NOTE — Progress Notes (Signed)
Subjective:    Patient ID: Shaun Kennedy, male    DOB: [birth year], 79 y.o.   MRN: [id number]  HPI 79 year old former smoker (25 pack years) with a history of allergic rhinitis, asthma, prostate cancer, diabetes.   He was seen in the emergency department on 08/16/2019 for shortness of breath and cough.  The cough started about 2-3 weeks ago. No hemoptysis. He has lost 80 lbs over the last year, unexplained. He is off his Pletal, last took it 2 weeks ago.   A CT scan of his chest and abdomen were performed on 7/9 which I have reviewed, shows a 6.7 x 4.8 cm heterogeneous left lower lobe medial mass with numerous bilateral noncalcified nodules.  Also mediastinal, subcarinal lymphadenopathy.   Review of Systems As per HPI  Past Medical History:  Diagnosis Date  . Allergic rhinitis   . Asthma   . Carotid stenosis   . Colon cancer ([place]) 2003  . Diabetes mellitus ([place])   . Diverticulosis   . Dyslipidemia   . ED (erectile dysfunction)   . GERD (gastroesophageal reflux disease)   . H/O degenerative disc disease   . Hemorrhoids   . HTN (hypertension)   . Hyperlipidemia   . LVH (left ventricular hypertrophy)    on EKG  . Smoker    former     Family History  Problem Relation Age of Onset  . Heart failure Mother   . Diabetes Mother   . Stroke Father   . Diabetes Father   . Diabetes Sister   . Diabetes Sister   . Lung cancer Sister   . Colon cancer Neg Hx   . Esophageal cancer Neg Hx   . Rectal cancer Neg Hx   . Colon polyps Neg Hx   . Stomach cancer Neg Hx      Social History   Socioeconomic History  . Marital status: Married    Spouse name: Not on file  . Number of children: 3  . Years of education: Not on file  . Highest education level: Not on file  Occupational History  . Occupation: retired  Tobacco Use  . Smoking status: Former Smoker    Quit date: 10/09/1987    Years since quitting: 31.8  . Smokeless tobacco: Never Used  Vaping Use  . Vaping Use: Never used   Substance and Sexual Activity  . Alcohol use: No  . Drug use: No  . Sexual activity: Yes  Other Topics Concern  . Not on file  Social History Narrative   Married, no pets   Social Determinants of Health   Financial Resource Strain:   . Difficulty of Paying Living Expenses:   Food Insecurity:   . Worried About Charity fundraiser in the Last Year:   . Arboriculturist in the Last Year:   Transportation Needs:   . Film/video editor (Medical):   Marland Kitchen Lack of Transportation (Non-Medical):   Physical Activity:   . Days of Exercise per Week:   . Minutes of Exercise per Session:   Stress:   . Feeling of Stress :   Social Connections:   . Frequency of Communication with Friends and Family:   . Frequency of Social Gatherings with Friends and Family:   . Attends Religious Services:   . Active Member of Clubs or Organizations:   . Attends Archivist Meetings:   Marland Kitchen Marital Status:   Intimate Partner Violence:   . Fear of Current or  Ex-Partner:   . Emotionally Abused:   Marland Kitchen Physically Abused:   . Sexually Abused:      No Known Allergies   Outpatient Medications Prior to Visit  Medication Sig Dispense Refill  . aspirin EC 81 MG tablet Take 81 mg by mouth daily.    . Blood Glucose Monitoring Suppl (ONETOUCH VERIO) w/Device KIT USE TO CHECK SUGAR DAILY 1 kit 0  . Blood Glucose Monitoring Suppl (ONETOUCH VERIO) w/Device KIT USE TO CHECK SUGAR DAILY 1 kit 0  . cholecalciferol (VITAMIN D) 1000 units tablet Take 1,000 Units by mouth daily.    . diclofenac (VOLTAREN) 75 MG EC tablet 1 po bid prn 30 tablet 0  . Lancets (ONETOUCH DELICA PLUS [id number]) MISC PATIENT IS TO TEST TWO TIMES A DAY DX: E11.9 (ONETOUCH VERIO FLEX) 100 each 12  . lisinopril-hydrochlorothiazide (ZESTORETIC) 20-12.5 MG tablet Take 1 tablet by mouth daily. 90 tablet 2  . metFORMIN (GLUCOPHAGE-XR) 500 MG 24 hr tablet TAKE 1 TABLET BY MOUTH EVERY DAY 90 tablet 1  . Multiple Vitamin (MULTIVITAMIN WITH MINERALS)  TABS tablet Take 1 tablet by mouth daily.    Glory Rosebush VERIO test strip USE AS INSTRUCTED TO CHECK TWICE DAILY 200 strip 5  . simvastatin (ZOCOR) 40 MG tablet Take 1 tablet (40 mg total) by mouth daily. 90 tablet 3  . solifenacin (VESICARE) 10 MG tablet Take 1 tablet (10 mg total) by mouth daily. 30 tablet 5  . amLODipine (NORVASC) 5 MG tablet Take 1 tablet (5 mg total) by mouth daily. 90 tablet 3  . cilostazol (PLETAL) 100 MG tablet Take 1 tablet (100 mg total) by mouth 2 (two) times daily before a meal. (Patient not taking: Reported on 08/19/2019) 180 tablet 3  . dronabinol (MARINOL) 2.5 MG capsule Take 1 capsule (2.5 mg total) by mouth 2 (two) times daily before a meal. (Patient not taking: Reported on 08/19/2019) 30 capsule 1  . HYDROcodone-acetaminophen (NORCO) 7.5-325 MG tablet Take 1-2 tablets by mouth 3 (three) times daily as needed for moderate pain. (Patient not taking: Reported on 08/14/2019) 30 tablet 0  . linaclotide (LINZESS) 72 MCG capsule Take 1 capsule (72 mcg total) by mouth daily before breakfast. Take on an empty stomach. (Patient not taking: Reported on 08/14/2019) 30 capsule 3  . omeprazole (PRILOSEC) 40 MG capsule Take 1 capsule (40 mg total) by mouth 2 (two) times daily. (Patient not taking: Reported on 08/14/2019) 90 capsule 3  . ondansetron (ZOFRAN) 4 MG tablet Take 1-2 tablets (4-8 mg total) by mouth every 8 (eight) hours as needed for nausea or vomiting. (Patient not taking: Reported on 08/14/2019) 40 tablet 0   Facility-Administered Medications Prior to Visit  Medication Dose Route Frequency Provider Last Rate Last Admin  . 0.9 %  sodium chloride infusion  500 mL Intravenous Once Pyrtle, Lajuan Lines, MD            Objective:   Physical Exam  Vitals:   08/19/19 1346  BP: 130/80  Pulse: 94  Temp: 98.6 F (37 C)  TempSrc: Oral  SpO2: 99%  Weight: 161 lb 12.8 oz (73.4 kg)  Height: 5' 7.5" (1.715 m)   Gen: Pleasant, well-nourished, in no distress,  normal affect  ENT: No  lesions,  mouth clear,  oropharynx clear, no postnasal drip  Neck: No JVD, no stridor  Lungs: No use of accessory muscles, no crackles or wheezing on normal respiration, decreased on the L  Cardiovascular: RRR, heart sounds normal, no murmur or  gallops, no peripheral edema  Musculoskeletal: No deformities, no cyanosis or clubbing  Neuro: alert, awake, non focal  Skin: Warm, no lesions or rash      Assessment & Plan:  Mass of left lung In a former smoker, also with history of prostate cancer.  Suspect that this is primary lung cancer.  He has mediastinal lymphadenopathy.  I think he will have an endobronchial lesion based on CT evaluation.  He needs bronchoscopy and EBUS ASAP.  I will try to arrange for tomorrow 7/13.  Baltazar Apo, MD, PhD 08/19/2019, 2:24 PM [facility] [phone] or if no answer [phone]

## 2019-08-19 NOTE — Telephone Encounter (Signed)
  You are scheduled on 08/22/19  at 4 pm. You should arrive 15 minutes prior to your appointment time for registration. Please go to [facility] and pick up contrast at least 2 days prior to the appt.

## 2019-08-19 NOTE — Telephone Encounter (Signed)
JMP, Looks like the patient went to the emergency department with shortness of breath. He ended up getting a CT chest/abdomen/pelvis performed and it is looking like he has a new lung cancer. He already has an appointment with Dr. Lamonte Sakai today. Although his unintentional weight loss and GI symptoms had the position thus to rule out GI etiologies for his weight loss, it seems that this is more likely a result of his lung process. We will be available for follow-up if needed. Patty, can you please reach out to the patient and family later this week and see if there is anything else that we can do. Thank you. GM

## 2019-08-19 NOTE — Telephone Encounter (Signed)
Shaun Kennedy,  Pt's CT scan has been approved and can be rescheduled Auth# [id number]  Valid 08/08/2019 to 02/04/2020

## 2019-08-19 NOTE — Telephone Encounter (Signed)
Received a staff msg from the thoracic navigator to schedule Shaun Kennedy to see Dr. Julien Nordmann on 7/16 at 10am w/labs at 930am. I lft the appt date and time on the pt's vm.

## 2019-08-19 NOTE — Progress Notes (Signed)
Pt denies any acute pulmonary issues. Pt denies chest pain. Pt denies being under the care of a cardiologist. Pt stated that a stress test was performed > 10 years ago. Pt denies having an echo and cardiac cath. Pt made aware to stop taking  Aspirin (unless otherwise advised by surgeon), vitamins, fish oil and herbal medications. Do not take any NSAIDs ie: Ibuprofen, Advil, Naproxen (Aleve), Motrin, BC and Goody Powder.. Pt requested that daughter Rosana Berger be provided with pre-op instructions via speaker phone. Pt made aware to hold Metformin DOS. Pt made aware to check CBG every 2 hours prior to arrival to hospital on DOS. Pt made aware to treat a CBG < 70 with 4 ounces of apple or cranberry juice, wait 15 minutes after intervention to recheck CBG, if CBG remains < 70, call Short Stay unit to speak with a nurse. Pt reminded to continue to quarantine.  Family verbalized understanding of all pre-op instructions.

## 2019-08-19 NOTE — Patient Instructions (Signed)
We will arrange for bronchoscopy on 08/20/2019 at [facility] endoscopy We will arrange for you to have your preoperative Covid testing today at [facility] Follow with Dr Lamonte Sakai in 1 month

## 2019-08-20 ENCOUNTER — Telehealth: Payer: Self-pay | Admitting: *Deleted

## 2019-08-20 ENCOUNTER — Ambulatory Visit (HOSPITAL_COMMUNITY): Payer: Medicare HMO

## 2019-08-20 ENCOUNTER — Encounter (HOSPITAL_COMMUNITY): Payer: Self-pay | Admitting: Emergency Medicine

## 2019-08-20 ENCOUNTER — Encounter (HOSPITAL_COMMUNITY)
Admission: RE | Disposition: A | Discharge status: Home / Self Care | Payer: Self-pay | Source: Home / Self Care | Attending: Emergency Medicine

## 2019-08-20 ENCOUNTER — Ambulatory Visit (HOSPITAL_COMMUNITY): Payer: Medicare HMO | Admitting: Anesthesiology

## 2019-08-20 ENCOUNTER — Other Ambulatory Visit: Payer: Self-pay

## 2019-08-20 ENCOUNTER — Ambulatory Visit (HOSPITAL_COMMUNITY)
Admission: RE | Admit: 2019-08-20 | Discharge: 2019-08-20 | Disposition: A | Discharge status: Home / Self Care | Payer: Medicare HMO | Attending: Emergency Medicine | Admitting: Emergency Medicine

## 2019-08-20 DIAGNOSIS — I10 Essential (primary) hypertension: Secondary | ICD-10-CM | POA: Diagnosis not present

## 2019-08-20 DIAGNOSIS — C3490 Malignant neoplasm of unspecified part of unspecified bronchus or lung: Secondary | ICD-10-CM | POA: Diagnosis present

## 2019-08-20 DIAGNOSIS — Z7984 Long term (current) use of oral hypoglycemic drugs: Secondary | ICD-10-CM | POA: Insufficient documentation

## 2019-08-20 DIAGNOSIS — Z8546 Personal history of malignant neoplasm of prostate: Secondary | ICD-10-CM | POA: Insufficient documentation

## 2019-08-20 DIAGNOSIS — C771 Secondary and unspecified malignant neoplasm of intrathoracic lymph nodes: Secondary | ICD-10-CM | POA: Insufficient documentation

## 2019-08-20 DIAGNOSIS — E1151 Type 2 diabetes mellitus with diabetic peripheral angiopathy without gangrene: Secondary | ICD-10-CM | POA: Insufficient documentation

## 2019-08-20 DIAGNOSIS — Z79899 Other long term (current) drug therapy: Secondary | ICD-10-CM | POA: Diagnosis not present

## 2019-08-20 DIAGNOSIS — R918 Other nonspecific abnormal finding of lung field: Secondary | ICD-10-CM | POA: Diagnosis not present

## 2019-08-20 DIAGNOSIS — Z791 Long term (current) use of non-steroidal anti-inflammatories (NSAID): Secondary | ICD-10-CM | POA: Insufficient documentation

## 2019-08-20 DIAGNOSIS — Z85038 Personal history of other malignant neoplasm of large intestine: Secondary | ICD-10-CM | POA: Insufficient documentation

## 2019-08-20 DIAGNOSIS — R590 Localized enlarged lymph nodes: Secondary | ICD-10-CM | POA: Diagnosis not present

## 2019-08-20 DIAGNOSIS — Z9889 Other specified postprocedural states: Secondary | ICD-10-CM | POA: Diagnosis not present

## 2019-08-20 DIAGNOSIS — J45909 Unspecified asthma, uncomplicated: Secondary | ICD-10-CM | POA: Insufficient documentation

## 2019-08-20 DIAGNOSIS — K219 Gastro-esophageal reflux disease without esophagitis: Secondary | ICD-10-CM | POA: Diagnosis not present

## 2019-08-20 DIAGNOSIS — Z87891 Personal history of nicotine dependence: Secondary | ICD-10-CM | POA: Insufficient documentation

## 2019-08-20 DIAGNOSIS — C3432 Malignant neoplasm of lower lobe, left bronchus or lung: Secondary | ICD-10-CM | POA: Insufficient documentation

## 2019-08-20 DIAGNOSIS — Z7982 Long term (current) use of aspirin: Secondary | ICD-10-CM | POA: Insufficient documentation

## 2019-08-20 DIAGNOSIS — Z7902 Long term (current) use of antithrombotics/antiplatelets: Secondary | ICD-10-CM | POA: Diagnosis not present

## 2019-08-20 DIAGNOSIS — E785 Hyperlipidemia, unspecified: Secondary | ICD-10-CM | POA: Insufficient documentation

## 2019-08-20 HISTORY — DX: Presence of dental prosthetic device (complete) (partial): Z97.2

## 2019-08-20 HISTORY — DX: Localized enlarged lymph nodes: R59.0

## 2019-08-20 HISTORY — DX: Other nonspecific abnormal finding of lung field: R91.8

## 2019-08-20 HISTORY — DX: Presence of spectacles and contact lenses: Z97.3

## 2019-08-20 HISTORY — PX: BRONCHIAL NEEDLE ASPIRATION BIOPSY: SHX5106

## 2019-08-20 HISTORY — PX: BRONCHIAL BIOPSY: SHX5109

## 2019-08-20 HISTORY — PX: VIDEO BRONCHOSCOPY WITH ENDOBRONCHIAL ULTRASOUND: SHX6177

## 2019-08-20 HISTORY — PX: BRONCHIAL BRUSHINGS: SHX5108

## 2019-08-20 LAB — CBC
HCT: 31 % — ABNORMAL LOW (ref 39.0–52.0)
Hemoglobin: 9.9 g/dL — ABNORMAL LOW (ref 13.0–17.0)
MCH: 29.4 pg (ref 26.0–34.0)
MCHC: 31.9 g/dL (ref 30.0–36.0)
MCV: 92 fL (ref 80.0–100.0)
Platelets: 497 10*3/uL — ABNORMAL HIGH (ref 150–400)
RBC: 3.37 MIL/uL — ABNORMAL LOW (ref 4.22–5.81)
RDW: 12.1 % (ref 11.5–15.5)
WBC: 7 10*3/uL (ref 4.0–10.5)
nRBC: 0 % (ref 0.0–0.2)

## 2019-08-20 LAB — BASIC METABOLIC PANEL
Anion gap: 13 (ref 5–15)
BUN: 15 mg/dL (ref 8–23)
CO2: 23 mmol/L (ref 22–32)
Calcium: 9.7 mg/dL (ref 8.9–10.3)
Chloride: 99 mmol/L (ref 98–111)
Creatinine, Ser: 1.2 mg/dL (ref 0.61–1.24)
GFR calc Af Amer: >60 mL/min (ref >60)
GFR calc non Af Amer: 57 mL/min — ABNORMAL LOW (ref >60)
Glucose, Bld: 104 mg/dL — ABNORMAL HIGH (ref 70–99)
Potassium: 4.8 mmol/L (ref 3.5–5.1)
Sodium: 135 mmol/L (ref 135–145)

## 2019-08-20 LAB — PROTIME-INR
INR: 1.2 (ref 0.8–1.2)
Prothrombin Time: 15 seconds (ref 11.4–15.2)

## 2019-08-20 LAB — GLUCOSE, CAPILLARY
Glucose-Capillary: 110 mg/dL — ABNORMAL HIGH (ref 70–99)
Glucose-Capillary: 94 mg/dL (ref 70–99)

## 2019-08-20 LAB — APTT: aPTT: 33 seconds (ref 24–36)

## 2019-08-20 SURGERY — BRONCHOSCOPY, WITH EBUS
Anesthesia: General

## 2019-08-20 MED ORDER — FENTANYL CITRATE (PF) 100 MCG/2ML IJ SOLN
INTRAMUSCULAR | Status: DC | PRN
  Administered 2019-08-20: 50 ug via INTRAVENOUS
  Administered 2019-08-20 (×2): 25 ug via INTRAVENOUS

## 2019-08-20 MED ORDER — PROPOFOL 10 MG/ML IV BOLUS
INTRAVENOUS | Status: DC | PRN
  Administered 2019-08-20: 130 mg via INTRAVENOUS

## 2019-08-20 MED ORDER — ONDANSETRON HCL 4 MG/2ML IJ SOLN
INTRAMUSCULAR | Status: DC | PRN
  Administered 2019-08-20: 4 mg via INTRAVENOUS

## 2019-08-20 MED ORDER — PHENYLEPHRINE HCL (PRESSORS) 10 MG/ML IV SOLN
INTRAVENOUS | Status: DC | PRN
  Administered 2019-08-20 (×2): 80 ug via INTRAVENOUS
  Administered 2019-08-20 (×2): 120 ug via INTRAVENOUS
  Administered 2019-08-20: 80 ug via INTRAVENOUS

## 2019-08-20 MED ORDER — ROCURONIUM BROMIDE 100 MG/10ML IV SOLN
INTRAVENOUS | Status: DC | PRN
  Administered 2019-08-20: 60 mg via INTRAVENOUS

## 2019-08-20 MED ORDER — CHLORHEXIDINE GLUCONATE 0.12 % MT SOLN
OROMUCOSAL | Status: AC
  Filled 2019-08-20: qty 15

## 2019-08-20 MED ORDER — DEXAMETHASONE SODIUM PHOSPHATE 10 MG/ML IJ SOLN
INTRAMUSCULAR | Status: DC | PRN
  Administered 2019-08-20: 5 mg via INTRAVENOUS

## 2019-08-20 MED ORDER — SUGAMMADEX SODIUM 200 MG/2ML IV SOLN
INTRAVENOUS | Status: DC | PRN
  Administered 2019-08-20: 200 mg via INTRAVENOUS

## 2019-08-20 MED ORDER — LIDOCAINE HCL (CARDIAC) PF 100 MG/5ML IV SOSY
PREFILLED_SYRINGE | INTRAVENOUS | Status: DC | PRN
  Administered 2019-08-20: 60 mg via INTRAVENOUS

## 2019-08-20 MED ORDER — LACTATED RINGERS IV SOLN: INTRAVENOUS | Status: DC

## 2019-08-20 NOTE — Transfer of Care (Signed)
Immediate Anesthesia Transfer of Care Note  Patient: Shaun Kennedy  Procedure(s) Performed: VIDEO BRONCHOSCOPY WITH ENDOBRONCHIAL ULTRASOUND (N/A ) BRONCHIAL BRUSHINGS BRONCHIAL NEEDLE ASPIRATION BIOPSIES BRONCHIAL BIOPSIES  Patient Location: PACU and Endoscopy Unit  Anesthesia Type:General  Level of Consciousness: drowsy  Airway & Oxygen Therapy: Patient Spontanous Breathing and Patient connected to nasal cannula oxygen  Post-op Assessment: Report given to RN, Post -op Vital signs reviewed and stable and Patient moving all extremities  Post vital signs: Reviewed and stable  Last Vitals:  Vitals Value Taken Time  BP 134/61 08/20/19 1036  Temp    Pulse 84 08/20/19 1036  Resp 21 08/20/19 1036  SpO2 100 % 08/20/19 1036  Vitals shown include unvalidated device data.  Last Pain:  Vitals:   08/20/19 0718  PainSc: 4       Patients Stated Pain Goal: 3 (43/32/95 [id number])  Complications: No complications documented.

## 2019-08-20 NOTE — Discharge Instructions (Signed)
Flexible Bronchoscopy, Care After This sheet gives you information about how to care for yourself after your test. Your doctor may also give you more specific instructions. If you have problems or questions, contact your doctor. Follow these instructions at home: Eating and drinking  Do not eat or drink anything (not even water) for 2 hours after your test, or until your numbing medicine (local anesthetic) wears off.  When your numbness is gone and your cough and gag reflexes have come back, you may: ? Eat only soft foods. ? Slowly drink liquids.  The day after the test, go back to your normal diet. Driving  Do not drive for 24 hours if you were given a medicine to help you relax (sedative).  Do not drive or use heavy machinery while taking prescription pain medicine. General instructions   Take over-the-counter and prescription medicines only as told by your doctor.  Return to your normal activities as told. Ask what activities are safe for you.  Do not use any products that have nicotine or tobacco in them. This includes cigarettes and e-cigarettes. If you need help quitting, ask your doctor.  Keep all follow-up visits as told by your doctor. This is important. It is very important if you had a tissue sample (biopsy) taken. Get help right away if:  You have shortness of breath that gets worse.  You get light-headed.  You feel like you are going to pass out (faint).  You have chest pain.  You cough up: ? More than a little blood. ? More blood than before. Summary  Do not eat or drink anything (not even water) for 2 hours after your test, or until your numbing medicine wears off.  Do not use cigarettes. Do not use e-cigarettes.  Get help right away if you have chest pain.   Please call our office for any questions or concerns.  [phone].  This information is not intended to replace advice given to you by your health care provider. Make sure you discuss any  questions you have with your health care provider. Document Revised: 01/06/2017 Document Reviewed: 02/12/2016 Elsevier Patient Education  2020 [employer].

## 2019-08-20 NOTE — Progress Notes (Signed)
This RN took pt to daughters car in wheelchair. Pt was having hiccups. Pt stated he was not short of breath, lung sounds clear. When pt go to car he started having stridor like sound breathing and pt daughter requested pt be seen by Dr. Delilah Shan RN brought pt back to endoscopy unit and Dr. Lamonte Sakai was called. Dr. Lamonte Sakai placed order for stat chest XR. Pt O2 saturation on room air at 84. This RN placed NRB on pt for O2 saturation to increase. Dr. Lamonte Sakai notified.

## 2019-08-20 NOTE — Anesthesia Procedure Notes (Signed)
Procedure Name: Intubation Date/Time: 08/20/2019 9:11 AM Performed by: Angeleena Dueitt T, CRNA Pre-anesthesia Checklist: Patient identified, Emergency Drugs available, Suction available and Patient being monitored Patient Re-evaluated:Patient Re-evaluated prior to induction Oxygen Delivery Method: Circle system utilized Preoxygenation: Pre-oxygenation with 100% oxygen Induction Type: IV induction Ventilation: Mask ventilation without difficulty Laryngoscope Size: Miller and 3 Grade View: Grade I Tube type: Oral Tube size: 8.5 mm Number of attempts: 1 Airway Equipment and Method: Stylet,  Oral airway and LTA kit utilized Placement Confirmation: ETT inserted through vocal cords under direct vision,  positive ETCO2 and breath sounds checked- equal and bilateral Secured at: 23 cm Tube secured with: Tape Dental Injury: Teeth and Oropharynx as per pre-operative assessment

## 2019-08-20 NOTE — Anesthesia Postprocedure Evaluation (Signed)
Anesthesia Post Note  Patient: HESTER FORGET  Procedure(s) Performed: VIDEO BRONCHOSCOPY WITH ENDOBRONCHIAL ULTRASOUND (N/A ) BRONCHIAL BRUSHINGS BRONCHIAL NEEDLE ASPIRATION BIOPSIES BRONCHIAL BIOPSIES     Patient location during evaluation: PACU Anesthesia Type: General Level of consciousness: awake and alert Pain management: pain level controlled Vital Signs Assessment: post-procedure vital signs reviewed and stable Respiratory status: spontaneous breathing, nonlabored ventilation and respiratory function stable Cardiovascular status: blood pressure returned to baseline and stable Postop Assessment: no apparent nausea or vomiting Anesthetic complications: no   No complications documented.  Last Vitals:  Vitals:   08/20/19 1205 08/20/19 1215  BP: 129/63 (!) 123/59  Pulse: 92 88  Resp: 20 (!) 21  Temp:    SpO2: 97% 96%    Last Pain:  Vitals:   08/20/19 1056  TempSrc:   PainSc: 0-No pain                 Catalina Gravel

## 2019-08-20 NOTE — Telephone Encounter (Signed)
I received a message that family is confused about the appt with Dr. Julien Nordmann.  I updated but they would like to hold off on appt per Dr. Agustina Caroli information.  I will cancel and updated new patient coordinator to call and re-schedule.

## 2019-08-20 NOTE — Progress Notes (Addendum)
Pt on 2L [place] with O2 saturation @100 %. Pt states he "belched" and he feels better. Stridor not heard on assessment. Pt states he doesn't feel short of breath anymore. Pt awaiting for XR. Pt daughter called and updated

## 2019-08-20 NOTE — Op Note (Signed)
[facility] Cardiopulmonary Patient Name: Shaun Kennedy Pocedure Date: 08/20/2019 MRN: [id number] Attending MD: Collene Gobble , MD Date of Birth: 1940/12/07 CSN: Finalized Age: 79 Admit Type: Outpatient Gender: Male Procedure:             Bronchoscopy Indications:           Left lower lobe mass, Mediastinal adenopathy Providers:             Collene Gobble, MD, Grace Isaac, RN, Vista Lawman,                         RN, Cletis [place], Technician Referring MD:           Medicines:             General Anesthesia Complications:         No immediate complications Estimated Blood Loss:  Estimated blood loss was minimal. Procedure:             Pre-Anesthesia Assessment:                        - A History and Physical has been performed. Patient                         meds and allergies have been reviewed. The risks and                         benefits of the procedure and the sedation options and                         risks were discussed with the patient. All questions                         were answered and informed consent was obtained.                         Patient identification and proposed procedure were                         verified prior to the procedure by the physician in                         the pre-procedure area. Mental Status Examination:                         alert and oriented. Airway Examination: normal                         oropharyngeal airway. Respiratory Examination:                         rhonchi. CV Examination: normal. ASA Grade Assessment:                         II - A patient with mild systemic disease. After                         reviewing the risks and benefits, the patient was  deemed in satisfactory condition to undergo the                         procedure. The anesthesia plan was to use general                         anesthesia. Immediately prior to administration of                          medications, the patient was re-assessed for adequacy                         to receive sedatives. The heart rate, respiratory                         rate, oxygen saturations, blood pressure, adequacy of                         pulmonary ventilation, and response to care were                         monitored throughout the procedure. The physical                         status of the patient was re-assessed after the                         procedure.                        After obtaining informed consent, the bronchoscope was                         passed under direct vision. Throughout the procedure,                         the patient's blood pressure, pulse, and oxygen                         saturations were monitored continuously. the [id number]                         ([id number]) Olympus Therapeutic Bronchoscope was                         introduced through the mouth, via the endotracheal                         tube and advanced to the tracheobronchial tree. The                         procedure was accomplished without difficulty. The                         patient tolerated the procedure well. Scope In: Scope Out: Findings:      The trachea is of normal caliber. The carina is sharp. The       tracheobronchial tree of the right lung was examined to at least the       first  subsegmental level. Bronchial mucosa and anatomy in the right lung       are normal; there are no endobronchial lesions, and no secretions.      Left Lung Abnormalities: A partially obstructing (about 90% obstructed)       mass was found proximally, at the orifice in the superior segment of the       left lower lobe (B6). The mass was endobronchial, friable and       submucosal. The lesion was not traversed. Guided brushings were obtained       in the superior segment of the left lower lobe with a cytology brush and       sent for routine cytology. Three samples were obtained. Endobronchial       biopsies  were performed in the superior segment of the left lower lobe       using forceps and sent for histopathology examination. Three samples       were obtained. The remainder of the L lung airways appeared normal.      An endobronchial ultrasound endoscope was utilized in order to assist       with fine needle aspiration in the mediastinum. Transbronchial needle       aspiration of enlarged nodes at stations 4R, 7 and 12L were performed in       the mediastinum using an Olympus EBUS-TBNA needle and sent for routine       cytology. Impression:            - Left lower lobe mass                        - Mediastinal adenopathy                        - The airway examination of the right lung was normal.                        - An endobronchial, friable and submucosal mass was                         found in the superior segment of the left lower lobe                         (B6). This lesion is likely malignant.                        - Brushings were obtained at [facility] Superior segment.                        - An endobronchial biopsy was performed at [facility] segment.                        - Endobronchial ultrasound was performed.                        - Transbronchial needle aspiration was performed at                         stations 4R, 7 and 12L. Moderate Sedation:  Performed under general anesthesia Recommendation:        - Await biopsy, brushing and cytology results. Procedure Code(s):     --- Professional ---                        [phone], Bronchoscopy, rigid or flexible, including                         fluoroscopic guidance, when performed; with                         transbronchial needle aspiration biopsy(s), trachea,                         main stem and/or lobar bronchus(i)                        31623, Bronchoscopy, rigid or flexible, including                         fluoroscopic guidance, when performed; with brushing                          or protected brushings                        31654, Bronchoscopy, rigid or flexible, including                         fluoroscopic guidance, when performed; with                         transendoscopic endobronchial ultrasound (EBUS) during                         bronchoscopic diagnostic or therapeutic                         intervention(s) for peripheral lesion(s) (List                         separately in addition to code for primary                         procedure[s]) Diagnosis Code(s):     --- Professional ---                        R91.8, Other nonspecific abnormal finding of lung field                        R59.0, Localized enlarged lymph nodes                        J98.9, Respiratory disorder, unspecified CPT copyright 2019 [employer]. All rights reserved. The codes documented in this report are preliminary and upon coder review may  be revised to meet current compliance requirements. Collene Gobble, MD Collene Gobble, MD 08/20/2019 10:34:07 AM Number of Addenda: 0

## 2019-08-20 NOTE — Anesthesia Preprocedure Evaluation (Addendum)
Anesthesia Evaluation  Patient identified by MRN, date of birth, ID band Patient awake    Reviewed: Allergy & Precautions, NPO status , Patient's Chart, lab work & pertinent test results  Airway Mallampati: II  TM Distance: >3 FB Neck ROM: Full    Dental  (+) Dental Advisory Given, Upper Dentures, Lower Dentures   Pulmonary asthma , former smoker,  Lung mass   Pulmonary exam normal breath sounds clear to auscultation       Cardiovascular hypertension, Pt. on medications + Peripheral Vascular Disease  Normal cardiovascular exam Rhythm:Regular Rate:Normal     Neuro/Psych negative neurological ROS     GI/Hepatic Neg liver ROS, GERD  Medicated,  Endo/Other  diabetes, Type 2, Oral Hypoglycemic Agents  Renal/GU Renal disease     Musculoskeletal negative musculoskeletal ROS (+)   Abdominal   Peds  Hematology  (+) Blood dyscrasia, anemia ,   Anesthesia Other Findings Day of surgery medications reviewed with the patient.  Reproductive/Obstetrics                             Anesthesia Physical Anesthesia Plan  ASA: III  Anesthesia Plan: General   Post-op Pain Management:    Induction: Intravenous  PONV Risk Score and Plan: 2 and Dexamethasone and Ondansetron  Airway Management Planned: Oral ETT  Additional Equipment:   Intra-op Plan:   Post-operative Plan: Extubation in OR  Informed Consent: I have reviewed the patients History and Physical, chart, labs and discussed the procedure including the risks, benefits and alternatives for the proposed anesthesia with the patient or authorized representative who has indicated his/her understanding and acceptance.     Dental advisory given  Plan Discussed with: CRNA  Anesthesia Plan Comments:         Anesthesia Quick Evaluation

## 2019-08-20 NOTE — Interval H&P Note (Signed)
History and Physical Interval Note:  08/20/2019 7:48 AM  Shaun Kennedy  has presented today for surgery, with the diagnosis of LEFT LOWER LOBE MASS.  The various methods of treatment have been discussed with the patient and family. After consideration of risks, benefits and other options for treatment, the patient has consented to  Procedure(s): [place] (N/A) as a surgical intervention.  The patient's history has been reviewed, patient examined, no change in status, stable for surgery.  I have reviewed the patient's chart and labs.  Questions were answered to the patient's satisfaction.     Collene Gobble

## 2019-08-21 ENCOUNTER — Telehealth: Payer: Self-pay | Admitting: Emergency Medicine

## 2019-08-21 ENCOUNTER — Other Ambulatory Visit (HOSPITAL_COMMUNITY): Payer: Medicare HMO

## 2019-08-21 ENCOUNTER — Telehealth: Payer: Self-pay | Admitting: Family Medicine

## 2019-08-21 NOTE — Telephone Encounter (Signed)
This medication was discontinued by Dr. Redmond School at his previous visit. I recommend that he follow up next week with Dr. Redmond School or he can also reach out to his urologist if he has one.

## 2019-08-21 NOTE — Telephone Encounter (Signed)
Received a call pt states he was placed Vesicare by [facility] 10mg . Pt states he needs refills but needs to increase dose, as current does is not enough.  I do not see medication on his list of meds. PT uses [facility] [address] and can be reached at [phone].

## 2019-08-21 NOTE — Telephone Encounter (Signed)
LMTCB for [facility]

## 2019-08-22 ENCOUNTER — Ambulatory Visit (HOSPITAL_COMMUNITY): Payer: Medicare HMO

## 2019-08-22 ENCOUNTER — Encounter (HOSPITAL_COMMUNITY): Payer: Self-pay | Admitting: Emergency Medicine

## 2019-08-22 LAB — SURGICAL PATHOLOGY

## 2019-08-22 LAB — CYTOLOGY - NON PAP

## 2019-08-22 NOTE — Telephone Encounter (Signed)
Spoke with Shaun Kennedy to see if he would be okay for [place] to schedule pt an OV on his virtual day 7/20 for qualifying walk for O2 and OV same day and he stated that was fine. Called pt's daughter Shaun Kennedy back letting her know that we could get pt scheduled for an appt 7/20 and she verbalized understanding. Pt has been scheduled for an appt 7/20 with Shaun Kennedy for an in-office visit for qualifying walk and OV after. Nothing further needed.

## 2019-08-22 NOTE — Telephone Encounter (Signed)
Pt's daughter Rosana Berger returning a phone call. Pt's daughter can be reached at [phone].

## 2019-08-22 NOTE — Telephone Encounter (Signed)
Made an appt for pt to come in Monday. [place]

## 2019-08-22 NOTE — Telephone Encounter (Signed)
His labs are still pending - I hope they will be back today I'm Ok with setting up a walking oximetry to see if he needs O2

## 2019-08-22 NOTE — Telephone Encounter (Signed)
Attempted to call [name] but unable to reach. Left message for her to return call.

## 2019-08-22 NOTE — Telephone Encounter (Signed)
Spoke with [facility] and advised her that I would send this message to Dr. Lamonte Sakai to review.   She is also requesting oxygen because she feels the pt gets winded a lot. I advised her that she would have to bring him in to get tested with a qualifying walk. She is ok with bringing him in. Please advise.   Cell [phone]

## 2019-08-22 NOTE — Telephone Encounter (Signed)
His pathology is back - shows squamous cell lung cancer. I reviewed with the pt and family. He has an OV with Oncology tomorrow  Please go ahead and work on getting him in foir walking oximetry. Thanks.

## 2019-08-22 NOTE — Telephone Encounter (Signed)
[name] daughter is returning phone call. [name] phone number is [phone]. May leave message with patient and [name] wife.

## 2019-08-23 ENCOUNTER — Other Ambulatory Visit: Payer: Self-pay

## 2019-08-23 ENCOUNTER — Other Ambulatory Visit: Payer: Medicare HMO

## 2019-08-23 ENCOUNTER — Inpatient Hospital Stay: Payer: Medicare HMO | Attending: Internal Medicine

## 2019-08-23 ENCOUNTER — Encounter: Payer: Self-pay | Admitting: Internal Medicine

## 2019-08-23 ENCOUNTER — Telehealth: Payer: Self-pay | Admitting: Emergency Medicine

## 2019-08-23 ENCOUNTER — Ambulatory Visit: Payer: Medicare HMO | Admitting: Internal Medicine

## 2019-08-23 ENCOUNTER — Encounter: Payer: Self-pay | Admitting: *Deleted

## 2019-08-23 ENCOUNTER — Telehealth: Payer: Self-pay | Admitting: *Deleted

## 2019-08-23 ENCOUNTER — Inpatient Hospital Stay: Payer: Medicare HMO | Admitting: Internal Medicine

## 2019-08-23 DIAGNOSIS — Z7189 Other specified counseling: Secondary | ICD-10-CM

## 2019-08-23 DIAGNOSIS — Z7984 Long term (current) use of oral hypoglycemic drugs: Secondary | ICD-10-CM

## 2019-08-23 DIAGNOSIS — Z5112 Encounter for antineoplastic immunotherapy: Secondary | ICD-10-CM

## 2019-08-23 DIAGNOSIS — Z833 Family history of diabetes mellitus: Secondary | ICD-10-CM

## 2019-08-23 DIAGNOSIS — Z7982 Long term (current) use of aspirin: Secondary | ICD-10-CM

## 2019-08-23 DIAGNOSIS — N400 Enlarged prostate without lower urinary tract symptoms: Secondary | ICD-10-CM

## 2019-08-23 DIAGNOSIS — Z79899 Other long term (current) drug therapy: Secondary | ICD-10-CM | POA: Diagnosis not present

## 2019-08-23 DIAGNOSIS — J45909 Unspecified asthma, uncomplicated: Secondary | ICD-10-CM | POA: Insufficient documentation

## 2019-08-23 DIAGNOSIS — Z8249 Family history of ischemic heart disease and other diseases of the circulatory system: Secondary | ICD-10-CM

## 2019-08-23 DIAGNOSIS — C3432 Malignant neoplasm of lower lobe, left bronchus or lung: Secondary | ICD-10-CM | POA: Diagnosis not present

## 2019-08-23 DIAGNOSIS — Z85038 Personal history of other malignant neoplasm of large intestine: Secondary | ICD-10-CM

## 2019-08-23 DIAGNOSIS — Z8546 Personal history of malignant neoplasm of prostate: Secondary | ICD-10-CM | POA: Insufficient documentation

## 2019-08-23 DIAGNOSIS — Z87891 Personal history of nicotine dependence: Secondary | ICD-10-CM

## 2019-08-23 DIAGNOSIS — Z5111 Encounter for antineoplastic chemotherapy: Secondary | ICD-10-CM

## 2019-08-23 DIAGNOSIS — C3490 Malignant neoplasm of unspecified part of unspecified bronchus or lung: Secondary | ICD-10-CM

## 2019-08-23 DIAGNOSIS — E785 Hyperlipidemia, unspecified: Secondary | ICD-10-CM | POA: Insufficient documentation

## 2019-08-23 DIAGNOSIS — E119 Type 2 diabetes mellitus without complications: Secondary | ICD-10-CM

## 2019-08-23 DIAGNOSIS — I10 Essential (primary) hypertension: Secondary | ICD-10-CM | POA: Insufficient documentation

## 2019-08-23 DIAGNOSIS — K219 Gastro-esophageal reflux disease without esophagitis: Secondary | ICD-10-CM | POA: Diagnosis not present

## 2019-08-23 DIAGNOSIS — Z801 Family history of malignant neoplasm of trachea, bronchus and lung: Secondary | ICD-10-CM | POA: Insufficient documentation

## 2019-08-23 DIAGNOSIS — R918 Other nonspecific abnormal finding of lung field: Secondary | ICD-10-CM

## 2019-08-23 LAB — CBC WITH DIFFERENTIAL (CANCER CENTER ONLY)
Abs Immature Granulocytes: 0.02 10*3/uL (ref 0.00–0.07)
Basophils Absolute: 0 10*3/uL (ref 0.0–0.1)
Basophils Relative: 1 %
Eosinophils Absolute: 0.1 10*3/uL (ref 0.0–0.5)
Eosinophils Relative: 1 %
HCT: 30.7 % — ABNORMAL LOW (ref 39.0–52.0)
Hemoglobin: 9.8 g/dL — ABNORMAL LOW (ref 13.0–17.0)
Immature Granulocytes: 0 %
Lymphocytes Relative: 20 %
Lymphs Abs: 1.6 10*3/uL (ref 0.7–4.0)
MCH: 27.8 pg (ref 26.0–34.0)
MCHC: 31.9 g/dL (ref 30.0–36.0)
MCV: 87.2 fL (ref 80.0–100.0)
Monocytes Absolute: 0.5 10*3/uL (ref 0.1–1.0)
Monocytes Relative: 7 %
Neutro Abs: 5.9 10*3/uL (ref 1.7–7.7)
Neutrophils Relative %: 71 %
Platelet Count: 516 10*3/uL — ABNORMAL HIGH (ref 150–400)
RBC: 3.52 MIL/uL — ABNORMAL LOW (ref 4.22–5.81)
RDW: 12.1 % (ref 11.5–15.5)
WBC Count: 8.2 10*3/uL (ref 4.0–10.5)
nRBC: 0 % (ref 0.0–0.2)

## 2019-08-23 LAB — CMP (CANCER CENTER ONLY)
ALT: 11 U/L (ref 0–44)
AST: 14 U/L — ABNORMAL LOW (ref 15–41)
Albumin: 3.1 g/dL — ABNORMAL LOW (ref 3.5–5.0)
Alkaline Phosphatase: 63 U/L (ref 38–126)
Anion gap: 11 (ref 5–15)
BUN: 21 mg/dL (ref 8–23)
CO2: 25 mmol/L (ref 22–32)
Calcium: 9.8 mg/dL (ref 8.9–10.3)
Chloride: 95 mmol/L — ABNORMAL LOW (ref 98–111)
Creatinine: 1.4 mg/dL — ABNORMAL HIGH (ref 0.61–1.24)
GFR, Est AFR Am: 55 mL/min — ABNORMAL LOW (ref >60)
GFR, Estimated: 47 mL/min — ABNORMAL LOW (ref >60)
Glucose, Bld: 158 mg/dL — ABNORMAL HIGH (ref 70–99)
Potassium: 4 mmol/L (ref 3.5–5.1)
Sodium: 131 mmol/L — ABNORMAL LOW (ref 135–145)
Total Bilirubin: 0.6 mg/dL (ref 0.3–1.2)
Total Protein: 8.6 g/dL — ABNORMAL HIGH (ref 6.5–8.1)

## 2019-08-23 MED ORDER — METHYLPREDNISOLONE 4 MG PO TBPK
ORAL_TABLET | ORAL | 0 refills | Status: DC
Start: 2019-08-23 — End: 2019-09-25

## 2019-08-23 MED ORDER — PROCHLORPERAZINE MALEATE 10 MG PO TABS
10.0000 mg | ORAL_TABLET | Freq: Four times a day (QID) | ORAL | 0 refills | Status: DC | PRN
Start: 2019-08-23 — End: 2019-12-18

## 2019-08-23 NOTE — Patient Instructions (Signed)
Lung Cancer Lung cancer is an abnormal growth of cancerous cells that forms a mass (malignant tumor) in a lung. There are several types of lung cancer. The types are based on the appearance of the tumor cells. The two most common types are:  Non-small cell lung cancer. This type of lung cancer is the most common type. Non-small cell lung cancers include squamous cell carcinoma, adenocarcinoma, and large cell carcinoma.  Small cell lung cancer. In this type of lung cancer, abnormal cells are smaller than those of non-small cell lung cancer. Small cell lung cancer gets worse (progresses) faster than non-small cell lung cancer. What are the causes? The most common cause of lung cancer is smoking tobacco. The second most common cause is exposure to a chemical called radon. What increases the risk? You are more likely to develop this condition if:  You smoke tobacco.  You have been exposed to: ? Secondhand tobacco smoke. ? Radon gas. ? Uranium. ? Asbestos. ? Arsenic in drinking water. ? Air pollution.  You have a family or personal history of lung cancer.  You have had lung radiation therapy in the past.  You are older than age 81. What are the signs or symptoms? In the early stages, you may not have any symptoms. As the cancer progresses, symptoms may include:  A lasting cough, possibly with blood.  Fatigue.  Unexplained weight loss.  Shortness of breath.  Loud breathing (wheezing).  Chest pain.  Loss of appetite. Symptoms of advanced lung cancer include:  Hoarseness.  Bone or joint pain.  Weakness.  Change in the structure of the fingernails (clubbing), so that the nail looks like an upside-down spoon.  Swelling of the face or arms.  Inability to move the face (paralysis).  Drooping eyelids. How is this diagnosed? This condition may be diagnosed based on:  Your symptoms and medical history.  A physical exam.  A chest X-ray.  A CT scan.  Blood  tests.  Sputum tests.  Removal of a sample of lung tissue (lung biopsy) for testing. Your cancer will be assessed (staged) to determine how severe it is and how much it has spread (metastasized). How is this treated? Treatment depends on the type and stage of your cancer. Treatment may include one or more of the following:  Surgery to remove as much of the cancer as possible. Lymph nodes in the area may be removed and tested for cancer as well.  Medicines that kill cancer cells (chemotherapy).  High-energy rays that kill cancer cells (radiation therapy).  Chemotherapy. This treatment uses medicines to destroy cancer cells.  Targeted therapy. This targets specific parts of cancer cells and the area around them to block the growth and spread of the cancer. Targeted therapy can help limit the damage to healthy cells. Follow these instructions at home: Eating and drinking  Some of your treatments might affect your appetite. If you are having problems eating, or if you do not have an appetite, meet with a dietitian.  If you have side effects that affect your appetite, it may help to: ? Eat smaller meals and snacks often. ? Drink high-nutrition and high-calorie shakes or supplements. ? Eat bland and soft foods that are easy to eat. ? Avoid eating foods that are hot, spicy, or hard to swallow. General instructions   Do not use any products that contain nicotine or tobacco, such as cigarettes and e-cigarettes. If you need help quitting, ask your health care provider.  Do not drink alcohol.  If you are admitted to the hospital, make sure your cancer specialist (oncologist) is aware. Your cancer may affect your treatment for other conditions.  Take over-the-counter and prescription medicines only as told by your health care provider.  Consider joining a support group for people who have been diagnosed with lung cancer.  Work with your health care provider to manage any side effects of  treatment.  Keep all follow-up visits as told by your health care provider. This is important. Where to find more information  American Cancer Society: https://www.cancer.[place] https://www.cancer.gov Contact a health care provider if you:  Lose weight without trying.  Have a persistent cough and wheezing.  Feel short of breath.  Get tired easily.  Have bone or joint pain.  Have difficulty swallowing.  Notice that your voice is changing or getting hoarse.  Have pain that does not get better with medicine. Get help right away if you:  Cough up blood.  Have new breathing problems.  Have chest pain.  Have a fever.  Have swelling in an ankle, leg, or arm, or the face or neck.  Have paralysis in your face.  Are very confused.  Have a drooping eyelid. Summary  Lung cancer is an abnormal growth of cancerous cells that forms a mass (malignant tumor) in a lung.  There are several types of lung cancer. The types are based on the appearance of the tumor cells. The two most common types are non-small cell and small cell.  The most common cause of lung cancer is smoking tobacco.  Early symptoms include a lasting cough, possibly with blood, and fatigue, unexplained weight loss, and shortness of breath.  After diagnosis, treatment depends on the type and stage of your cancer. This information is not intended to replace advice given to you by your health care provider. Make sure you discuss any questions you have with your health care provider. Document Revised: 01/06/2017 Document Reviewed: 12/01/2016 Elsevier Patient Education  [place].  Options: 1) Palliative Care and Hospice 2) Carboplatin + Paclitaxel + Nat Math for 4 cycles then [place] every 3 weeks up to 2 years. 3) Carboplatin + paclitaxel + Opdivo and Yervoy for 2 cycles.  Then Opdivo and Yervoy alone every 3 weeks up to 2 years. 4) Opdivo and Yervoy with no chemo up to 2  years if PD-L1 expression is 1% or higher.  Carboplatin injection What is this medicine? CARBOPLATIN (KAR boe pla tin) is a chemotherapy drug. It targets fast dividing cells, like cancer cells, and causes these cells to die. This medicine is used to treat ovarian cancer and many other cancers. This medicine may be used for other purposes; ask your health care provider or pharmacist if you have questions. COMMON BRAND NAME(S): Paraplatin What should I tell my health care provider before I take this medicine? They need to know if you have any of these conditions:  blood disorders  hearing problems  kidney disease  recent or ongoing radiation therapy  an unusual or allergic reaction to carboplatin, cisplatin, other chemotherapy, other medicines, foods, dyes, or preservatives  pregnant or trying to get pregnant  breast-feeding How should I use this medicine? This drug is usually given as an infusion into a vein. It is administered in a hospital or clinic by a specially trained health care professional. Talk to your pediatrician regarding the use of this medicine in children. Special care may be needed. Overdosage: If you think you have taken too much  of this medicine contact a poison control center or emergency room at once. NOTE: This medicine is only for you. Do not share this medicine with others. What if I miss a dose? It is important not to miss a dose. Call your doctor or health care professional if you are unable to keep an appointment. What may interact with this medicine?  medicines for seizures  medicines to increase blood counts like filgrastim, pegfilgrastim, sargramostim  some antibiotics like amikacin, gentamicin, neomycin, streptomycin, tobramycin  vaccines Talk to your doctor or health care professional before taking any of these medicines:  acetaminophen  aspirin  ibuprofen  ketoprofen  naproxen This list may not describe all possible interactions. Give  your health care provider a list of all the medicines, herbs, non-prescription drugs, or dietary supplements you use. Also tell them if you smoke, drink alcohol, or use illegal drugs. Some items may interact with your medicine. What should I watch for while using this medicine? Your condition will be monitored carefully while you are receiving this medicine. You will need important blood work done while you are taking this medicine. This drug may make you feel generally unwell. This is not uncommon, as chemotherapy can affect healthy cells as well as cancer cells. Report any side effects. Continue your course of treatment even though you feel ill unless your doctor tells you to stop. In some cases, you may be given additional medicines to help with side effects. Follow all directions for their use. Call your doctor or health care professional for advice if you get a fever, chills or sore throat, or other symptoms of a cold or flu. Do not treat yourself. This drug decreases your body's ability to fight infections. Try to avoid being around people who are sick. This medicine may increase your risk to bruise or bleed. Call your doctor or health care professional if you notice any unusual bleeding. Be careful brushing and flossing your teeth or using a toothpick because you may get an infection or bleed more easily. If you have any dental work done, tell your dentist you are receiving this medicine. Avoid taking products that contain aspirin, acetaminophen, ibuprofen, naproxen, or ketoprofen unless instructed by your doctor. These medicines may hide a fever. Do not become pregnant while taking this medicine. Women should inform their doctor if they wish to become pregnant or think they might be pregnant. There is a potential for serious side effects to an unborn child. Talk to your health care professional or pharmacist for more information. Do not breast-feed an infant while taking this medicine. What side  effects may I notice from receiving this medicine? Side effects that you should report to your doctor or health care professional as soon as possible:  allergic reactions like skin rash, itching or hives, swelling of the face, lips, or tongue  signs of infection - fever or chills, cough, sore throat, pain or difficulty passing urine  signs of decreased platelets or bleeding - bruising, pinpoint red spots on the skin, black, tarry stools, nosebleeds  signs of decreased red blood cells - unusually weak or tired, fainting spells, lightheadedness  breathing problems  changes in hearing  changes in vision  chest pain  high blood pressure  low blood counts - This drug may decrease the number of white blood cells, red blood cells and platelets. You may be at increased risk for infections and bleeding.  nausea and vomiting  pain, swelling, redness or irritation at the injection site  pain,  tingling, numbness in the hands or feet  problems with balance, talking, walking  trouble passing urine or change in the amount of urine Side effects that usually do not require medical attention (report to your doctor or health care professional if they continue or are bothersome):  hair loss  loss of appetite  metallic taste in the mouth or changes in taste This list may not describe all possible side effects. Call your doctor for medical advice about side effects. You may report side effects to FDA at 1-800-FDA-1088. Where should I keep my medicine? This drug is given in a hospital or clinic and will not be stored at home. NOTE: This sheet is a summary. It may not cover all possible information. If you have questions about this medicine, talk to your doctor, pharmacist, or health care provider.  2020 Elsevier/Gold Standard (2007-05-01 14:38:05)  Paclitaxel injection What is this medicine? PACLITAXEL (PAK li TAX el) is a chemotherapy drug. It targets fast dividing cells, like cancer cells,  and causes these cells to die. This medicine is used to treat ovarian cancer, breast cancer, lung cancer, Kaposi's sarcoma, and other cancers. This medicine may be used for other purposes; ask your health care provider or pharmacist if you have questions. COMMON BRAND NAME(S): Onxol, Taxol What should I tell my health care provider before I take this medicine? They need to know if you have any of these conditions:  history of irregular heartbeat  liver disease  low blood counts, like low white cell, platelet, or red cell counts  lung or breathing disease, like asthma  tingling of the fingers or toes, or other nerve disorder  an unusual or allergic reaction to paclitaxel, alcohol, polyoxyethylated castor oil, other chemotherapy, other medicines, foods, dyes, or preservatives  pregnant or trying to get pregnant  breast-feeding How should I use this medicine? This drug is given as an infusion into a vein. It is administered in a hospital or clinic by a specially trained health care professional. Talk to your pediatrician regarding the use of this medicine in children. Special care may be needed. Overdosage: If you think you have taken too much of this medicine contact a poison control center or emergency room at once. NOTE: This medicine is only for you. Do not share this medicine with others. What if I miss a dose? It is important not to miss your dose. Call your doctor or health care professional if you are unable to keep an appointment. What may interact with this medicine? Do not take this medicine with any of the following medications:  disulfiram  metronidazole This medicine may also interact with the following medications:  antiviral medicines for hepatitis, HIV or AIDS  certain antibiotics like erythromycin and clarithromycin  certain medicines for fungal infections like ketoconazole and itraconazole  certain medicines for seizures like carbamazepine, phenobarbital,  phenytoin  gemfibrozil  nefazodone  rifampin  St. John's wort This list may not describe all possible interactions. Give your health care provider a list of all the medicines, herbs, non-prescription drugs, or dietary supplements you use. Also tell them if you smoke, drink alcohol, or use illegal drugs. Some items may interact with your medicine. What should I watch for while using this medicine? Your condition will be monitored carefully while you are receiving this medicine. You will need important blood work done while you are taking this medicine. This medicine can cause serious allergic reactions. To reduce your risk you will need to take other medicine(s) before treatment  with this medicine. If you experience allergic reactions like skin rash, itching or hives, swelling of the face, lips, or tongue, tell your doctor or health care professional right away. In some cases, you may be given additional medicines to help with side effects. Follow all directions for their use. This drug may make you feel generally unwell. This is not uncommon, as chemotherapy can affect healthy cells as well as cancer cells. Report any side effects. Continue your course of treatment even though you feel ill unless your doctor tells you to stop. Call your doctor or health care professional for advice if you get a fever, chills or sore throat, or other symptoms of a cold or flu. Do not treat yourself. This drug decreases your body's ability to fight infections. Try to avoid being around people who are sick. This medicine may increase your risk to bruise or bleed. Call your doctor or health care professional if you notice any unusual bleeding. Be careful brushing and flossing your teeth or using a toothpick because you may get an infection or bleed more easily. If you have any dental work done, tell your dentist you are receiving this medicine. Avoid taking products that contain aspirin, acetaminophen, ibuprofen,  naproxen, or ketoprofen unless instructed by your doctor. These medicines may hide a fever. Do not become pregnant while taking this medicine. Women should inform their doctor if they wish to become pregnant or think they might be pregnant. There is a potential for serious side effects to an unborn child. Talk to your health care professional or pharmacist for more information. Do not breast-feed an infant while taking this medicine. Men are advised not to father a child while receiving this medicine. This product may contain alcohol. Ask your pharmacist or healthcare provider if this medicine contains alcohol. Be sure to tell all healthcare providers you are taking this medicine. Certain medicines, like metronidazole and disulfiram, can cause an unpleasant reaction when taken with alcohol. The reaction includes flushing, headache, nausea, vomiting, sweating, and increased thirst. The reaction can last from 30 minutes to several hours. What side effects may I notice from receiving this medicine? Side effects that you should report to your doctor or health care professional as soon as possible:  allergic reactions like skin rash, itching or hives, swelling of the face, lips, or tongue  breathing problems  changes in vision  fast, irregular heartbeat  high or low blood pressure  mouth sores  pain, tingling, numbness in the hands or feet  signs of decreased platelets or bleeding - bruising, pinpoint red spots on the skin, black, tarry stools, blood in the urine  signs of decreased red blood cells - unusually weak or tired, feeling faint or lightheaded, falls  signs of infection - fever or chills, cough, sore throat, pain or difficulty passing urine  signs and symptoms of liver injury like dark yellow or brown urine; general ill feeling or flu-like symptoms; light-colored stools; loss of appetite; nausea; right upper belly pain; unusually weak or tired; yellowing of the eyes or  skin  swelling of the ankles, feet, hands  unusually slow heartbeat Side effects that usually do not require medical attention (report to your doctor or health care professional if they continue or are bothersome):  diarrhea  hair loss  loss of appetite  muscle or joint pain  nausea, vomiting  pain, redness, or irritation at site where injected  tiredness This list may not describe all possible side effects. Call your doctor for medical  advice about side effects. You may report side effects to FDA at 1-800-FDA-1088. Where should I keep my medicine? This drug is given in a hospital or clinic and will not be stored at home. NOTE: This sheet is a summary. It may not cover all possible information. If you have questions about this medicine, talk to your doctor, pharmacist, or health care provider.  2020 Elsevier/Gold Standard (2016-09-27 13:14:55)   Pembrolizumab injection What is this medicine? PEMBROLIZUMAB (pem broe liz ue mab) is a monoclonal antibody. It is used to treat certain types of cancer. This medicine may be used for other purposes; ask your health care provider or pharmacist if you have questions. COMMON BRAND NAME(S): Keytruda What should I tell my health care provider before I take this medicine? They need to know if you have any of these conditions:  diabetes  immune system problems  inflammatory bowel disease  liver disease  lung or breathing disease  lupus  received or scheduled to receive an organ transplant or a stem-cell transplant that uses donor stem cells  an unusual or allergic reaction to pembrolizumab, other medicines, foods, dyes, or preservatives  pregnant or trying to get pregnant  breast-feeding How should I use this medicine? This medicine is for infusion into a vein. It is given by a health care professional in a hospital or clinic setting. A special MedGuide will be given to you before each treatment. Be sure to read this  information carefully each time. Talk to your pediatrician regarding the use of this medicine in children. While this drug may be prescribed for children as young as 6 months for selected conditions, precautions do apply. Overdosage: If you think you have taken too much of this medicine contact a poison control center or emergency room at once. NOTE: This medicine is only for you. Do not share this medicine with others. What if I miss a dose? It is important not to miss your dose. Call your doctor or health care professional if you are unable to keep an appointment. What may interact with this medicine? Interactions have not been studied. Give your health care provider a list of all the medicines, herbs, non-prescription drugs, or dietary supplements you use. Also tell them if you smoke, drink alcohol, or use illegal drugs. Some items may interact with your medicine. This list may not describe all possible interactions. Give your health care provider a list of all the medicines, herbs, non-prescription drugs, or dietary supplements you use. Also tell them if you smoke, drink alcohol, or use illegal drugs. Some items may interact with your medicine. What should I watch for while using this medicine? Your condition will be monitored carefully while you are receiving this medicine. You may need blood work done while you are taking this medicine. Do not become pregnant while taking this medicine or for 4 months after stopping it. Women should inform their doctor if they wish to become pregnant or think they might be pregnant. There is a potential for serious side effects to an unborn child. Talk to your health care professional or pharmacist for more information. Do not breast-feed an infant while taking this medicine or for 4 months after the last dose. What side effects may I notice from receiving this medicine? Side effects that you should report to your doctor or health care professional as soon as  possible:  allergic reactions like skin rash, itching or hives, swelling of the face, lips, or tongue  bloody or black, tarry  breathing problems  changes in vision  chest pain  chills  confusion  constipation  cough  diarrhea  dizziness or feeling faint or lightheaded  fast or irregular heartbeat  fever  flushing  joint pain  low blood counts - this medicine may decrease the number of white blood cells, red blood cells and platelets. You may be at increased risk for infections and bleeding.  muscle pain  muscle weakness  pain, tingling, numbness in the hands or feet  persistent headache  redness, blistering, peeling or loosening of the skin, including inside the mouth  signs and symptoms of high blood sugar such as dizziness; dry mouth; dry skin; fruity breath; nausea; stomach pain; increased hunger or thirst; increased urination  signs and symptoms of kidney injury like trouble passing urine or change in the amount of urine  signs and symptoms of liver injury like dark urine, light-colored stools, loss of appetite, nausea, right upper belly pain, yellowing of the eyes or skin  sweating  swollen lymph nodes  weight loss Side effects that usually do not require medical attention (report to your doctor or health care professional if they continue or are bothersome):  decreased appetite  hair loss  muscle pain  tiredness This list may not describe all possible side effects. Call your doctor for medical advice about side effects. You may report side effects to FDA at 1-800-FDA-1088. Where should I keep my medicine? This drug is given in a hospital or clinic and will not be stored at home. NOTE: This sheet is a summary. It may not cover all possible information. If you have questions about this medicine, talk to your doctor, pharmacist, or health care provider.  2020 Elsevier/Gold Standard (2018-11-30 18:07:58)  Nivolumab injection What is this  medicine? NIVOLUMAB (nye VOL ue mab) is a monoclonal antibody. It is used to treat colon cancer, esophageal cancer, head and neck cancer, Hodgkin lymphoma, kidney cancer, liver cancer, lung cancer, mesothelioma, melanoma, and urothelial cancer. This medicine may be used for other purposes; ask your health care provider or pharmacist if you have questions. COMMON BRAND NAME(S): Opdivo What should I tell my health care provider before I take this medicine? They need to know if you have any of these conditions:  diabetes  immune system problems  kidney disease  liver disease  lung disease  organ transplant  stomach or intestine problems  thyroid disease  an unusual or allergic reaction to nivolumab, other medicines, foods, dyes, or preservatives  pregnant or trying to get pregnant  breast-feeding How should I use this medicine? This medicine is for infusion into a vein. It is given by a health care professional in a hospital or clinic setting. A special MedGuide will be given to you before each treatment. Be sure to read this information carefully each time. Talk to your pediatrician regarding the use of this medicine in children. While this drug may be prescribed for children as young as 12 years for selected conditions, precautions do apply. Overdosage: If you think you have taken too much of this medicine contact a poison control center or emergency room at once. NOTE: This medicine is only for you. Do not share this medicine with others. What if I miss a dose? It is important not to miss your dose. Call your doctor or health care professional if you are unable to keep an appointment. What may interact with this medicine? Interactions have not been studied. Give your health care provider a list of all the  medicines, herbs, non-prescription drugs, or dietary supplements you use. Also tell them if you smoke, drink alcohol, or use illegal drugs. Some items may interact with your  medicine. This list may not describe all possible interactions. Give your health care provider a list of all the medicines, herbs, non-prescription drugs, or dietary supplements you use. Also tell them if you smoke, drink alcohol, or use illegal drugs. Some items may interact with your medicine. What should I watch for while using this medicine? This drug may make you feel generally unwell. Continue your course of treatment even though you feel ill unless your doctor tells you to stop. You may need blood work done while you are taking this medicine. Do not become pregnant while taking this medicine or for 5 months after stopping it. Women should inform their doctor if they wish to become pregnant or think they might be pregnant. There is a potential for serious side effects to an unborn child. Talk to your health care professional or pharmacist for more information. Do not breast-feed an infant while taking this medicine or for 5 months after stopping it. What side effects may I notice from receiving this medicine? Side effects that you should report to your doctor or health care professional as soon as possible:  allergic reactions like skin rash, itching or hives, swelling of the face, lips, or tongue  breathing problems  blood in the urine  bloody or watery diarrhea or black, tarry stools  changes in emotions or moods  changes in vision  chest pain  cough  dizziness  feeling faint or lightheaded, falls  fever, chills  headache with fever, neck stiffness, confusion, loss of memory, sensitivity to light, hallucination, loss of contact with reality, or seizures  joint pain  mouth sores  redness, blistering, peeling or loosening of the skin, including inside the mouth  severe muscle pain or weakness  signs and symptoms of high blood sugar such as dizziness; dry mouth; dry skin; fruity breath; nausea; stomach pain; increased hunger or thirst; increased urination  signs and  symptoms of kidney injury like trouble passing urine or change in the amount of urine  signs and symptoms of liver injury like dark yellow or brown urine; general ill feeling or flu-like symptoms; light-colored stools; loss of appetite; nausea; right upper belly pain; unusually weak or tired; yellowing of the eyes or skin  swelling of the ankles, feet, hands  trouble passing urine or change in the amount of urine  unusually weak or tired  weight gain or loss Side effects that usually do not require medical attention (report to your doctor or health care professional if they continue or are bothersome):  bone pain  constipation  decreased appetite  diarrhea  muscle pain  nausea, vomiting  tiredness This list may not describe all possible side effects. Call your doctor for medical advice about side effects. You may report side effects to FDA at 1-800-FDA-1088. Where should I keep my medicine? This drug is given in a hospital or clinic and will not be stored at home. NOTE: This sheet is a summary. It may not cover all possible information. If you have questions about this medicine, talk to your doctor, pharmacist, or health care provider.  2020 [employer] Standard (2018-11-13 10:04:50)  Ipilimumab injection What is this medicine? IPILIMUMAB (IP i LIM ue mab) is a monoclonal antibody. It is used to treat colorectal cancer, kidney cancer, liver cancer, lung cancer, melanoma, and mesothelioma. This medicine may be used for  other purposes; ask your health care provider or pharmacist if you have questions. COMMON BRAND NAME(S): YERVOY What should I tell my health care provider before I take this medicine? They need to know if you have any of these conditions:  Addison's disease  blood in your stools (black or tarry stools) or if you have blood in your vomit  eye disease, vision problems  history of pancreatitis  history of stomach bleeding  immune system  problems  inflammatory bowel disease  kidney disease  liver disease  lupus  myasthenia gravis  organ transplant  rheumatoid arthritis  sarcoidosis  stomach or intestine problems  thyroid disease  tingling of the fingers or toes, or other nerve disorder  an unusual or allergic reaction to ipilimumab, other medicines, foods, dyes, or preservatives  pregnant or trying to get pregnant  breast-feeding How should I use this medicine? This medicine is for infusion into a vein. It is given by a health care professional in a hospital or clinic setting. A special MedGuide will be given to you before each treatment. Be sure to read this information carefully each time. Talk to your pediatrician regarding the use of this medicine in children. While this drug may be prescribed for children as young as 12 years for selected conditions, precautions do apply. Overdosage: If you think you have taken too much of this medicine contact a poison control center or emergency room at once. NOTE: This medicine is only for you. Do not share this medicine with others. What if I miss a dose? It is important not to miss your dose. Call your doctor or health care professional if you are unable to keep an appointment. What may interact with this medicine? Interactions are not expected. This list may not describe all possible interactions. Give your health care provider a list of all the medicines, herbs, non-prescription drugs, or dietary supplements you use. Also tell them if you smoke, drink alcohol, or use illegal drugs. Some items may interact with your medicine. What should I watch for while using this medicine? Tell your doctor or healthcare professional if your symptoms do not start to get better or if they get worse. Do not become pregnant while taking this medicine or for 3 months after stopping it. Women should inform their doctor if they wish to become pregnant or think they might be pregnant.  There is a potential for serious side effects to an unborn child. Talk to your health care professional or pharmacist for more information. Do not breast-feed an infant while taking this medicine or for 3 months after the last dose. Your condition will be monitored carefully while you are receiving this medicine. You may need blood work done while you are taking this medicine. What side effects may I notice from receiving this medicine? Side effects that you should report to your doctor or health care professional as soon as possible:  allergic reactions like skin rash, itching or hives, swelling of the face, lips, or tongue  black, tarry stools  bloody or watery diarrhea  changes in vision  dizziness  eye pain  fast, irregular heartbeat  feeling anxious  feeling faint or lightheaded, falls  nausea, vomiting  pain, tingling, numbness in the hands or feet  redness, blistering, peeling or loosening of the skin, including inside the mouth  signs and symptoms of liver injury like dark yellow or brown urine; general ill feeling or flu-like symptoms; light-colored stools; loss of appetite; nausea; right upper belly pain; unusually  weak or tired; yellowing of the eyes or skin  unusual bleeding or bruising Side effects that usually do not require medical attention (report to your doctor or health care professional if they continue or are bothersome):  headache  loss of appetite  trouble sleeping This list may not describe all possible side effects. Call your doctor for medical advice about side effects. You may report side effects to FDA at 1-800-FDA-1088. Where should I keep my medicine? This drug is given in a hospital or clinic and will not be stored at home. NOTE: This sheet is a summary. It may not cover all possible information. If you have questions about this medicine, talk to your doctor, pharmacist, or health care provider.  2020 [employer] Standard (2018-11-13  10:56:55)

## 2019-08-23 NOTE — Progress Notes (Signed)
[place] [phone]   [phone]  CONSULT NOTE  REFERRING PHYSICIAN: Dr. Baltazar Apo  REASON FOR CONSULTATION:  79 years old [name] recently diagnosed with lung cancer.  HPI Shaun Kennedy is a 79 y.o. male with past medical history significant for hypertension, dyslipidemia, diabetes mellitus, GERD, diverticulosis, history of prostate cancer status post seed implants in 2001, history of allergy as well as long history for smoking but quit in 1986.  The patient mentioned that he has been complaining of cough and shortness of breath as well as weight loss since November 2020.  He had esophageal dilatation 1 week before presentation to the emergency department and that was followed by worsening dyspnea.  He was seen at the emergency department on 08/16/2019 and chest x-ray showed focal mass like opacity in the left perihilar region suspicious for infection or sequela of aspiration giving the recent to esophageal manipulation.  This was followed by CT scan of the chest, abdomen and pelvis with contrast on the same day and it showed a 6.7 x 4.8 cm heterogeneous low-attenuation left lower lobe lung mass consistent with primary lung malignancy.  There was also numerous bilateral noncalcified lung nodules of various sizes consistent with metastatic disease.  There was necrotic pretracheal, subcarinal and AP window lymph nodes.  The patient also has prostate radiation implantation seeds with moderately enlarged prostate gland.  The patient was seen by Dr. Lamonte Sakai and on 08/20/2019 he underwent bronchoscopy with biopsy of the obstructive left lower lobe lung mass.  The final pathology ([id number]) showed malignant cells.  The morphology and immunophenotype are consistent with metastatic squamous cell carcinoma.  There was limited material available for ancillary studies.  Dr. Lamonte Sakai kindly referred the patient to the clinic today for evaluation and  recommendation regarding treatment of his condition. When seen today he continues to complain of cough and wheezing as well as lack of appetite and chest pain.  He also has cough productive of blood-tinged sputum.  He lost around 80 pounds over the last 1 year.  He has no nausea, vomiting, diarrhea or constipation.  He denied having any headache or visual changes. Family history significant for father with a stroke and diabetes mellitus, mother had heart disease and diabetes.  He has 2 sisters with lung cancer. The patient is married and has 4 children.  He used to work for [employer] in [employer] and [employer].  He was accompanied today by his wife [name] daughter Rosana Berger.  His daughter Santiago Glad and his son were available by phone during the visit.  The patient has a history of smoking 1 pack/day for around 40 years and quit in 1986.  He has no history of alcohol or drug abuse.  HPI  Past Medical History:  Diagnosis Date  . Allergic rhinitis   . Asthma   . Carotid stenosis   . Colon cancer ([place]) 2003  . Diabetes mellitus ([place])   . Diverticulosis   . Dyslipidemia   . ED (erectile dysfunction)   . GERD (gastroesophageal reflux disease)   . H/O degenerative disc disease   . Hemorrhoids   . HTN (hypertension)   . Hyperlipidemia   . LVH (left ventricular hypertrophy)    on EKG  . Mass of lower lobe of left lung   . Mediastinal adenopathy   . Smoker    former  . Wears dentures   . Wears glasses     Past Surgical History:  Procedure Laterality Date  . BRONCHIAL BIOPSY  08/20/2019   Procedure: BRONCHIAL BIOPSIES;  Surgeon: Collene Gobble, MD;  Location: [facility] ENDOSCOPY;  Service: Pulmonary;;  . BRONCHIAL BRUSHINGS  08/20/2019   Procedure: BRONCHIAL BRUSHINGS;  Surgeon: Collene Gobble, MD;  Location: [facility] ENDOSCOPY;  Service: Pulmonary;;  . BRONCHIAL NEEDLE ASPIRATION BIOPSY  08/20/2019   Procedure: BRONCHIAL NEEDLE ASPIRATION BIOPSIES;  Surgeon: Collene Gobble, MD;  Location:  [facility] ENDOSCOPY;  Service: Pulmonary;;  . CARPAL TUNNEL RELEASE Right 01/09/2019   Procedure: CARPAL TUNNEL RELEASE;  Surgeon: Leandrew Koyanagi, MD;  Location: [place];  Service: Orthopedics;  Laterality: Right;  . CATARACT EXTRACTION Right 2018  . COLONOSCOPY  2007   Dr. Benson Norway  . I & D EXTREMITY Right 04/02/2016   Procedure: IRRIGATION AND DEBRIDEMENT GREAT TOE;  Surgeon: Leandrew Koyanagi, MD;  Location: [place];  Service: Orthopedics;  Laterality: Right;  . MULTIPLE TOOTH EXTRACTIONS    . RADIOACTIVE SEED IMPLANT  2003  . ULNAR TUNNEL RELEASE Right 01/09/2019   Procedure: RIGHT CUBITAL TUNNEL RELEASE AND CARPAL TUNNEL RELEASE;  Surgeon: Leandrew Koyanagi, MD;  Location: [place];  Service: Orthopedics;  Laterality: Right;  . UPPER GASTROINTESTINAL ENDOSCOPY    . VIDEO BRONCHOSCOPY WITH ENDOBRONCHIAL ULTRASOUND N/A 08/20/2019   Procedure: VIDEO BRONCHOSCOPY WITH ENDOBRONCHIAL ULTRASOUND;  Surgeon: Collene Gobble, MD;  Location: [name] ENDOSCOPY;  Service: Pulmonary;  Laterality: N/A;    Family History  Problem Relation Age of Onset  . Heart failure Mother   . Diabetes Mother   . Stroke Father   . Diabetes Father   . Diabetes Sister   . Diabetes Sister   . Lung cancer Sister   . Colon cancer Neg Hx   . Esophageal cancer Neg Hx   . Rectal cancer Neg Hx   . Colon polyps Neg Hx   . Stomach cancer Neg Hx     Social History Social History   Tobacco Use  . Smoking status: Former Smoker    Quit date: 10/09/1987    Years since quitting: 31.8  . Smokeless tobacco: Never Used  Vaping Use  . Vaping Use: Never used  Substance Use Topics  . Alcohol use: No  . Drug use: No    No Known Allergies  Current Outpatient Medications  Medication Sig Dispense Refill  . amLODipine (NORVASC) 5 MG tablet Take 1 tablet (5 mg total) by mouth daily. (Patient taking differently: Take 5 mg by mouth every evening. ) 90 tablet 3  . aspirin EC 81 MG tablet Take 81 mg by mouth daily after  breakfast.     . Blood Glucose Monitoring Suppl (ONETOUCH VERIO) w/Device KIT USE TO CHECK SUGAR DAILY 1 kit 0  . Blood Glucose Monitoring Suppl (ONETOUCH VERIO) w/Device KIT USE TO CHECK SUGAR DAILY 1 kit 0  . cholecalciferol (VITAMIN D) 1000 units tablet Take 1,000 Units by mouth daily after breakfast.     . Lancets (ONETOUCH DELICA PLUS [id number]) MISC PATIENT IS TO TEST TWO TIMES A DAY DX: E11.9 (ONETOUCH VERIO FLEX) 100 each 12  . lisinopril-hydrochlorothiazide (ZESTORETIC) 20-12.5 MG tablet Take 1 tablet by mouth daily. (Patient taking differently: Take 1 tablet by mouth daily after breakfast. ) 90 tablet 2  . metFORMIN (GLUCOPHAGE-XR) 500 MG 24 hr tablet TAKE 1 TABLET BY MOUTH EVERY DAY (Patient taking differently: Take 500 mg by mouth daily after breakfast. ) 90 tablet 1  . Multiple Vitamin (MULTIVITAMIN WITH MINERALS) TABS  tablet Take 1 tablet by mouth daily after breakfast.     . ONETOUCH VERIO test strip USE AS INSTRUCTED TO CHECK TWICE DAILY 200 strip 5  . simvastatin (ZOCOR) 40 MG tablet Take 1 tablet (40 mg total) by mouth daily. (Patient taking differently: Take 40 mg by mouth every evening. ) 90 tablet 3   No current facility-administered medications for this visit.    Review of Systems  Constitutional: positive for anorexia, fatigue and night sweats Eyes: negative Ears, nose, mouth, throat, and face: negative Respiratory: positive for cough, dyspnea on exertion, pleurisy/chest pain and wheezing Cardiovascular: negative Gastrointestinal: negative Genitourinary:negative Integument/breast: negative Hematologic/lymphatic: negative Musculoskeletal:negative Neurological: negative Behavioral/Psych: negative Endocrine: negative Allergic/Immunologic: negative  Physical Exam  NID:POEUM, healthy, no distress, well nourished, well developed and anxious SKIN: skin color, texture, turgor are normal, no rashes or significant lesions HEAD: Normocephalic, No masses, lesions,  tenderness or abnormalities EYES: normal, PERRLA, Conjunctiva are pink and non-injected EARS: External ears normal, Canals clear OROPHARYNX:no exudate, no erythema and lips, buccal mucosa, and tongue normal  NECK: supple, no adenopathy, no JVD LYMPH:  no palpable lymphadenopathy, no hepatosplenomegaly LUNGS: clear to auscultation , and palpation HEART: regular rate & rhythm, no murmurs and no gallops ABDOMEN:abdomen soft, non-tender, normal bowel sounds and no masses or organomegaly BACK: No CVA tenderness, Range of motion is normal EXTREMITIES:no joint deformities, effusion, or inflammation, no edema  NEURO: alert & oriented x 3 with fluent speech, no focal motor/sensory deficits  PERFORMANCE STATUS: ECOG 1  LABORATORY DATA: Lab Results  Component Value Date   WBC 7.0 08/20/2019   HGB 9.9 (L) 08/20/2019   HCT 31.0 (L) 08/20/2019   MCV 92.0 08/20/2019   PLT 497 (H) 08/20/2019      Chemistry      Component Value Date/Time   NA 135 08/20/2019 0658   NA 132 (L) 08/14/2019 0918   K 4.8 08/20/2019 0658   CL 99 08/20/2019 0658   CO2 23 08/20/2019 0658   BUN 15 08/20/2019 0658   BUN 16 08/14/2019 0918   CREATININE 1.20 08/20/2019 0658   CREATININE 1.25 (H) 08/08/2016 0912      Component Value Date/Time   CALCIUM 9.7 08/20/2019 0658   ALKPHOS 54 08/16/2019 1842   AST 16 08/16/2019 1842   ALT 12 08/16/2019 1842   BILITOT 0.9 08/16/2019 1842   BILITOT 0.6 08/14/2019 0918       RADIOGRAPHIC STUDIES: DG Chest 2 View  Result Date: 08/16/2019 CLINICAL DATA:  Shortness of breath, reported esophageal dilation 2 weeks prior EXAM: CHEST - 2 VIEW COMPARISON:  Radiograph 02/04/2015 FINDINGS: Focal masslike opacity in the left perihilar region, could reflect inflection or sequela of aspiration given recent esophageal manipulation however an underlying neoplasm is not excluded given these contours. No pneumothorax or effusion. No convincing features of edema. No acute osseous or soft  tissue abnormality. Degenerative changes are present in the imaged spine and shoulders. Telemetry leads overlie the chest. IMPRESSION: Focal masslike opacity in the left perihilar region, could reflect inflection or sequela of aspiration given recent esophageal manipulation however an underlying neoplasm is not excluded given these contours. Recommend further evaluation with contrast enhanced CT. Electronically Signed   By: Lovena Le M.D.   On: 08/16/2019 19:36   CT Chest W Contrast  Result Date: 08/16/2019 CLINICAL DATA:  Chest pain and shortness of breath. EXAM: CT CHEST, ABDOMEN, AND PELVIS WITH CONTRAST TECHNIQUE: Multidetector CT imaging of the chest, abdomen and pelvis was performed following the  standard protocol during bolus administration of intravenous contrast. CONTRAST:  164m OMNIPAQUE IOHEXOL 300 MG/ML  SOLN COMPARISON:  None. FINDINGS: CT CHEST FINDINGS Cardiovascular: No significant vascular findings. Normal heart size. No pericardial effusion. Mediastinum/Nodes: Necrotic pretracheal subcarinal and AP window lymph nodes are seen (the largest measures approximately 2.3 cm x 1.8 cm). Lungs/Pleura: A 6.7 cm x 4.8 cm heterogeneous low-attenuation lung mass is seen within the anteromedial aspect of the left lower lobe. This is most prominent within the region posterior to the left hilum. Numerous bilateral noncalcified lung nodules of various sizes are seen. There is no evidence of acute infiltrate, pleural effusion or pneumothorax. Musculoskeletal: Multilevel degenerative changes seen throughout the thoracic spine. CT ABDOMEN PELVIS FINDINGS Hepatobiliary: No focal liver abnormality is seen. No gallstones, gallbladder wall thickening, or biliary dilatation. Pancreas: Unremarkable. No pancreatic ductal dilatation or surrounding inflammatory changes. Spleen: Normal in size without focal abnormality. Adrenals/Urinary Tract: Adrenal glands are unremarkable. Kidneys are normal in size, without renal  calculi or hydronephrosis. A 1.6 cm simple cyst is seen within the anterior aspect of the mid right kidney. Bladder is unremarkable. Stomach/Bowel: Stomach is within normal limits. The appendix is not clearly identified. No evidence of bowel wall thickening, distention, or inflammatory changes. Noninflamed diverticula are seen throughout the sigmoid colon. Vascular/Lymphatic: There is moderate severity calcification of the abdominal aorta. No enlarged abdominal or pelvic lymph nodes. Reproductive: Prostate radiation implantation seeds are seen with a moderately enlarged prostate gland. Other: No abdominal wall hernia or abnormality. No abdominopelvic ascites. Musculoskeletal: Multilevel degenerative changes seen throughout the lumbar spine. IMPRESSION: 1. 6.7 cm x 4.8 cm heterogeneous low-attenuation left lower lobe lung mass consistent with primary lung malignancy. 2. Numerous bilateral noncalcified lung nodules of various sizes, consistent with metastatic disease. 3. Necrotic pretracheal, subcarinal and AP window lymph nodes. 4. Prostate radiation implantation seeds with a moderately enlarged prostate gland. 5. Noninflamed sigmoid diverticulosis. 6. Aortic atherosclerosis. Aortic Atherosclerosis (ICD10-I70.0). Electronically Signed   By: TVirgina NorfolkM.D.   On: 08/16/2019 20:33   CT ABDOMEN PELVIS W CONTRAST  Result Date: 08/16/2019 CLINICAL DATA:  Chest pain and shortness of breath. EXAM: CT CHEST, ABDOMEN, AND PELVIS WITH CONTRAST TECHNIQUE: Multidetector CT imaging of the chest, abdomen and pelvis was performed following the standard protocol during bolus administration of intravenous contrast. CONTRAST:  1021mOMNIPAQUE IOHEXOL 300 MG/ML  SOLN COMPARISON:  None. FINDINGS: Cardiovascular: No significant vascular findings. Normal heart size. No pericardial effusion. Mediastinum/Nodes: Necrotic pretracheal subcarinal and AP window lymph nodes are seen (the largest measures approximately 2.3 cm x 1.8 cm).  Lungs/Pleura: A 6.7 cm x 4.8 cm heterogeneous low-attenuation lung mass is seen within the anteromedial aspect of the left lower lobe. This is most prominent within the region posterior to the left hilum. Numerous bilateral noncalcified lung nodules of various sizes are seen. There is no evidence of acute infiltrate, pleural effusion or pneumothorax. Musculoskeletal: Multilevel degenerative changes seen throughout the thoracic spine. CT ABDOMEN PELVIS FINDINGS Hepatobiliary: No focal liver abnormality is seen. No gallstones, gallbladder wall thickening, or biliary dilatation. Pancreas: Unremarkable. No pancreatic ductal dilatation or surrounding inflammatory changes. Spleen: Normal in size without focal abnormality. Adrenals/Urinary Tract: Adrenal glands are unremarkable. Kidneys are normal in size, without renal calculi or hydronephrosis. A 1.6 cm simple cyst is seen within the anterior aspect of the mid right kidney. Bladder is unremarkable. Stomach/Bowel: Stomach is within normal limits. The appendix is not clearly identified. No evidence of bowel wall thickening, distention, or inflammatory changes. Noninflamed diverticula  are seen throughout the sigmoid colon. Vascular/Lymphatic: There is moderate severity calcification of the abdominal aorta. No enlarged abdominal or pelvic lymph nodes. Reproductive: Prostate radiation implantation seeds are seen with a moderately enlarged prostate gland. Other: No abdominal wall hernia or abnormality. No abdominopelvic ascites. Musculoskeletal: Multilevel degenerative changes seen throughout the lumbar spine. IMPRESSION: 1. 6.7 cm x 4.8 cm heterogeneous low-attenuation left lower lobe lung mass consistent with primary lung malignancy. 2. Numerous bilateral noncalcified lung nodules of various sizes, consistent with metastatic disease. 3. Necrotic pretracheal, subcarinal and AP window lymph nodes. 4. Prostate radiation implantation seeds with a moderately enlarged prostate  gland. 5. Noninflamed sigmoid diverticulosis. 6. Aortic atherosclerosis. Aortic Atherosclerosis (ICD10-I70.0). Electronically Signed   By: Virgina Norfolk M.D.   On: 08/16/2019 20:33   DG CHEST PORT 1 VIEW  Result Date: 08/20/2019 CLINICAL DATA:  Status post bronchoscopy EXAM: PORTABLE CHEST 1 VIEW COMPARISON:  August 16, 2019 chest radiograph and chest CT FINDINGS: No appreciable pneumothorax. There is again noted a mass overlying the left hilum measuring 5.5 x 4.9 cm. Lungs elsewhere clear. Heart is upper normal in size with pulmonary vascularity normal. No adenopathy. No bone lesions. IMPRESSION: No pneumothorax. Masslike area overlying the left hilum seen on CT to reside in the superior segment left lower lobe region. Lungs otherwise clear. Stable cardiac silhouette. Electronically Signed   By: Lowella Grip III M.D.   On: 08/20/2019 12:03    ASSESSMENT: This is a very pleasant 79 years old [name] recently diagnosed with stage IV (T3, N2, M1 a) non-small cell lung cancer, squamous cell carcinoma presented with obstructive left lower lobe lung mass in addition to mediastinal lymphadenopathy as well as bilateral pulmonary nodules diagnosed in July 2021.  PLAN: I had a lengthy discussion with the patient and his family today about his current disease stage, prognosis and treatment options. I personally and independently reviewed the scan images and discussed the results and showed the images to the patient and his family. I recommended for the patient to complete the staging work-up by ordering MRI of the brain as well as a PET scan to rule out any other metastatic disease. I explained to the patient that he has incurable condition and all the treatment will be of palliative nature. I discussed with him several options for treatment of his condition including palliative care and hospice referral versus palliative systemic chemotherapy with different regimens including carboplatin,  paclitaxel and Keytruda for 4 cycles followed by maintenance Keytruda as a single agent if the patient has no evidence for disease progression or unacceptable toxicity, versus treatment with a combination of 2 cycles of chemotherapy with carboplatin and paclitaxel in addition to immunotherapy with ipilimumab and nivolumab versus chemotherapy for rearrangement with ipilimumab and nivolumab if the PD-L1 expression is 1% or higher. I discussed with the patient all these treatment options in details and provided him with a handout about the different chemotherapy and immunotherapy regimens.  I also created chart on the board to explain the different options. I discussed with the patient the adverse effect of the chemotherapy including but not limited to alopecia, myelosuppression, nausea and vomiting, peripheral neuropathy, liver or renal dysfunction in addition to the immunotherapy adverse effects. The patient is interested in treatment but he would like to discuss his treatment options with his family before making a decision. They will call the office in the next few days with her decision and we expect to start the patient on treatment within 1 week of  their decision. We will arrange for the patient to have a chemotherapy education class before the first dose of his treatment. I will refer the patient to radiation oncology for consideration of short course of palliative radiotherapy to the obstructive left lower lobe lung mass. I will also arrange for the patient to have a Port-A-Cath placed for chemotherapy infusion. I will call his pharmacy with prescription for Compazine 10 mg p.o. every 6 hours as needed for nausea in addition to EMLA cream to be applied to the Port-A-Cath site before treatment. For the lack of appetite and weight loss, I will give the patient 1 week of treatment with Medrol Dosepak before starting his treatment. The patient will come back for follow-up visit 1 week after the start of  his treatment for evaluation and management of any adverse effect of his treatment. He was advised to call immediately if he has any other concerning symptoms in the interval.  The patient voices understanding of current disease status and treatment options and is in agreement with the current care plan.  All questions were answered. The patient knows to call the clinic with any problems, questions or concerns. We can certainly see the patient much sooner if necessary.  Thank you so much for allowing me to participate in the care of [name]. I will continue to follow up the patient with you and assist in his care.  The total time spent in the appointment was 90 minutes.  Disclaimer: This note was dictated with voice recognition software. Similar sounding words can inadvertently be transcribed and may not be corrected upon review.   Eilleen Kempf August 23, 2019, 9:50 AM

## 2019-08-23 NOTE — Telephone Encounter (Signed)
PDL-1 requested. 

## 2019-08-23 NOTE — Telephone Encounter (Signed)
Attempted to call pt's daughter Deneise Lever but unable to reach and unable to leave VM. Will try to call back later.

## 2019-08-23 NOTE — Progress Notes (Signed)
Per Dr. Julien Nordmann, I updated path dept to send recent bx for PDL 1

## 2019-08-24 DIAGNOSIS — Z5111 Encounter for antineoplastic chemotherapy: Secondary | ICD-10-CM | POA: Insufficient documentation

## 2019-08-24 DIAGNOSIS — Z5112 Encounter for antineoplastic immunotherapy: Secondary | ICD-10-CM | POA: Insufficient documentation

## 2019-08-24 DIAGNOSIS — Z7189 Other specified counseling: Secondary | ICD-10-CM | POA: Insufficient documentation

## 2019-08-24 MED ORDER — LIDOCAINE-PRILOCAINE 2.5-2.5 % EX CREA
TOPICAL_CREAM | CUTANEOUS | 0 refills | Status: DC
Start: 2019-08-24 — End: 2020-04-23

## 2019-08-26 ENCOUNTER — Telehealth: Payer: Self-pay | Admitting: Medical Oncology

## 2019-08-26 ENCOUNTER — Ambulatory Visit: Payer: Self-pay | Admitting: Family Medicine

## 2019-08-26 ENCOUNTER — Telehealth: Payer: Self-pay | Admitting: Internal Medicine

## 2019-08-26 NOTE — Telephone Encounter (Signed)
Scheduled appt per 7/19 sch msg - pt is wife is aware of appts.

## 2019-08-26 NOTE — Telephone Encounter (Signed)
LVM to expect a call from our scheduler with an appt to see Shaun Kennedy a few days after scans .

## 2019-08-26 NOTE — Telephone Encounter (Signed)
?   Who called Deneise Lever- I do not see that a call was placed to her- Tried calling her again and there was no answer and no option to leave msg.

## 2019-08-27 ENCOUNTER — Other Ambulatory Visit: Payer: Self-pay

## 2019-08-27 ENCOUNTER — Telehealth: Payer: Self-pay | Admitting: *Deleted

## 2019-08-27 ENCOUNTER — Ambulatory Visit: Payer: Medicare HMO | Admitting: Pulmonary Disease

## 2019-08-27 ENCOUNTER — Encounter: Payer: Self-pay | Admitting: Pulmonary Disease

## 2019-08-27 VITALS — BP 126/64 | HR 94 | Temp 97.8°F | Ht 67.5 in | Wt 160.2 lb

## 2019-08-27 DIAGNOSIS — Z87891 Personal history of nicotine dependence: Secondary | ICD-10-CM

## 2019-08-27 DIAGNOSIS — R918 Other nonspecific abnormal finding of lung field: Secondary | ICD-10-CM

## 2019-08-27 NOTE — Assessment & Plan Note (Signed)
Left lower lobe lung mass Diagnosis squamous cell carcinoma Followed by Dr. Earlie Server  Plan: Continue follow-up with oncology We will order pulmonary function testing to be performed sometime over the next 2 to 3 months 37-month follow-up with Dr. Lamonte Sakai Walk today in office, patient completed 3 laps with no oxygen desaturations on room air

## 2019-08-27 NOTE — Patient Instructions (Addendum)
You were seen today by Lauraine Rinne, NP  for:   It was a pleasure meeting you in office today.  Your walk test in office did not show any oxygen desaturations.  This is good news.  Continue your work-up with oncology and Dr. Earlie Server.  We will order a breathing test given your smoking history.  This will likely be completed sometime over the next 8 to 12 weeks.  We will plan to have follow-up with you in 2 to 3 months with Dr. Lamonte Sakai.  If you have any acute worsening respiratory changes please contact our office  [place]   1. Mass of left lung  - Pulmonary function test; Future  Continue follow-up with Dr. Earlie Server  2. Former smoker, stopped smoking in distant past  - Pulmonary function test; Future   We recommend today:  Orders Placed This Encounter  Procedures  . Pulmonary function test    Standing Status:   Future    Standing Expiration Date:   08/26/2020    Order Specific Question:   Where should this test be performed?    Answer:   [facility]    Order Specific Question:   Full PFT: includes the following: basic spirometry, spirometry pre & post bronchodilator, diffusion capacity (DLCO), lung volumes    Answer:   Full PFT   Orders Placed This Encounter  Procedures  . Pulmonary function test   No orders of the defined types were placed in this encounter.   Follow Up:    Return in about 3 months (around 11/27/2019), or if symptoms worsen or fail to improve, for Follow up with Dr. Lamonte Sakai, Follow up for FULL PFT - 60 min.   Please do your part to reduce the spread of COVID-19:      Reduce your risk of any infection  and COVID19 by using the similar precautions used for avoiding the common cold or flu:  Marland Kitchen Wash your hands often with soap and warm water for at least 20 seconds.  If soap and water are not readily available, use an alcohol-based hand sanitizer with at least 60% alcohol.  . If coughing or sneezing, cover your mouth and nose by coughing or  sneezing into the elbow areas of your shirt or coat, into a tissue or into your sleeve (not your hands). Langley Gauss A MASK when in public  . Avoid shaking hands with others and consider head nods or verbal greetings only. . Avoid touching your eyes, nose, or mouth with unwashed hands.  . Avoid close contact with people who are sick. . Avoid places or events with large numbers of people in one location, like concerts or sporting events. . If you have some symptoms but not all symptoms, continue to monitor at home and seek medical attention if your symptoms worsen. . If you are having a medical emergency, call 911.   [place] / e-Visit: [email or web address]         [facility]: [facility]: [phone]                   [facility]: [phone]     It is flu season:   >>> Best ways to protect herself from the flu: Receive the yearly flu vaccine, practice good hand hygiene washing with soap and also using hand sanitizer when available, eat a nutritious meals, get adequate rest, hydrate appropriately  Please contact the office if your symptoms worsen or you have concerns that you are not improving.   Thank you for choosing [facility] for your healthcare, and for allowing [place] to partner with you on your healthcare journey. I am thankful to be able to provide care to you today.   Wyn Quaker FNP-C

## 2019-08-27 NOTE — Progress Notes (Signed)
_0  ID: Shaun Kennedy, male    DOB: 02-Sep-1940, 79 y.o.   MRN: [id number]  Chief Complaint  Patient presents with  . Follow-up    follow up after ebus, walk in office for o2    Referring provider: [name], MD  HPI:  79 year old male former smoker followed in our office for left lower lobe lung mass, diagnosed with squamous cell lung cancer in July/2021  PMH: Diabetes, hypertension, hyperlipidemia Smoker/ Smoking History: Former smoker.  Quit 1989.  32-pack-year smoking history. Maintenance: None Pt of: Dr. Lamonte Sakai  08/27/2019  - Visit   79 year old male former smoker followed in our office for mass in left lung.  Patient was last seen in our office in July/2021.  By Dr. Lamonte Sakai.  At that time it was recommended to complete an EBUS ASAP.  Per chart review it appears that this was completed on 08/20/2019.  Dr. Lamonte Sakai contacted the patient and notified him that biopsy showed squamous cell lung cancer.  He was set up with follow-up with oncology.  He completed this follow-up on 08/23/2019 with Dr. Earlie Server.  An excerpt of that assessment and plan as listed below:  ASSESSMENT: This is a very pleasant 79 years old [name] recently diagnosed with stage IV (T3, N2, M1 a) non-small cell lung cancer, squamous cell carcinoma presented with obstructive left lower lobe lung mass in addition to mediastinal lymphadenopathy as well as bilateral pulmonary nodules diagnosed in July 2021.  PLAN: I had a lengthy discussion with the patient and his family today about his current disease stage, prognosis and treatment options. I personally and independently reviewed the scan images and discussed the results and showed the images to the patient and his family. I recommended for the patient to complete the staging work-up by ordering MRI of the brain as well as a PET scan to rule out any other metastatic disease. I explained to the patient that he has incurable condition and all the  treatment will be of palliative nature. I discussed with him several options for treatment of his condition including palliative care and hospice referral versus palliative systemic chemotherapy with different regimens including carboplatin, paclitaxel and Keytruda for 4 cycles followed by maintenance Keytruda as a single agent if the patient has no evidence for disease progression or unacceptable toxicity, versus treatment with a combination of 2 cycles of chemotherapy with carboplatin and paclitaxel in addition to immunotherapy with ipilimumab and nivolumab versus chemotherapy for rearrangement with ipilimumab and nivolumab if the PD-L1 expression is 1% or higher. I discussed with the patient all these treatment options in details and provided him with a handout about the different chemotherapy and immunotherapy regimens.  I also created chart on the board to explain the different options. I discussed with the patient the adverse effect of the chemotherapy including but not limited to alopecia, myelosuppression, nausea and vomiting, peripheral neuropathy, liver or renal dysfunction in addition to the immunotherapy adverse effects. The patient is interested in treatment but he would like to discuss his treatment options with his family before making a decision. They will call the office in the next few days with her decision and we expect to start the patient on treatment within 1 week of their decision. We will arrange for the patient to have a chemotherapy education class before the first dose of his treatment. I will refer the patient to radiation oncology for consideration of short course of palliative radiotherapy to the obstructive left lower lobe lung mass.  I will also arrange for the patient to have a Port-A-Cath placed for chemotherapy infusion. I will call his pharmacy with prescription for Compazine 10 mg p.o. every 6 hours as needed for nausea in addition to EMLA cream to be applied to the  Port-A-Cath site before treatment. For the lack of appetite and weight loss, I will give the patient 1 week of treatment with Medrol Dosepak before starting his treatment. The patient will come back for follow-up visit 1 week after the start of his treatment for evaluation and management of any adverse effect of his treatment. He was advised to call immediately if he has any other concerning symptoms in the interval.  The patient voices understanding of current disease status and treatment options and is in agreement with the current care plan.  All questions were answered. The patient knows to call the clinic with any problems, questions or concerns. We can certainly see the patient much sooner if necessary.  Thank you so much for allowing me to participate in the care of Shaun Kennedy. I will continue to follow up the patient with you and assist in his care.  The total time spent in the appointment was 90 minutes.  Disclaimer: This note was dictated with voice recognition software. Similar sounding words can inadvertently be transcribed and may not be corrected upon review.  Eilleen Kempf August 23, 2019, 9:50 AM   It was recommended that he have follow-up with our office today for assessment of walking oxygen levels.  We will coordinate this today.   Patient reporting that he is doing okay since last being seen.  He has completed follow-up with oncology.  Has appointment later on this week to continue with that work-up.  Walk today in office patient completed 3 laps without any oxygen desaturations.  Patient reports that when he is up and moving around he does not have shortness of breath.  He typically has more shortness of breath as well as mucus production when sitting.  He has never had pulmonary function testing.  He is not on any maintenance inhalers.   Questionaires / Pulmonary Flowsheets:   ACT:  No flowsheet data found.  MMRC: No flowsheet data found.  Epworth:   No flowsheet data found.  Tests:   08/16/2019-CT chest with contrast-6.7 x 4.8 cm heterogeneous low-attenuation left lower lobe lung mass consistent with primary lung malignancy   FENO:  No results found for: NITRICOXIDE  PFT: No flowsheet data found.  WALK:  SIX MIN WALK 08/27/2019  Supplimental Oxygen during Test? (L/min) No  Tech Comments: Average pace with no desats. Tolerated walk well.    Imaging: DG Chest 2 View  Result Date: 08/16/2019 CLINICAL DATA:  Shortness of breath, reported esophageal dilation 2 weeks prior EXAM: CHEST - 2 VIEW COMPARISON:  Radiograph 02/04/2015 FINDINGS: Focal masslike opacity in the left perihilar region, could reflect inflection or sequela of aspiration given recent esophageal manipulation however an underlying neoplasm is not excluded given these contours. No pneumothorax or effusion. No convincing features of edema. No acute osseous or soft tissue abnormality. Degenerative changes are present in the imaged spine and shoulders. Telemetry leads overlie the chest. IMPRESSION: Focal masslike opacity in the left perihilar region, could reflect inflection or sequela of aspiration given recent esophageal manipulation however an underlying neoplasm is not excluded given these contours. Recommend further evaluation with contrast enhanced CT. Electronically Signed   By: Lovena Le M.D.   On: 08/16/2019 19:36   CT Chest  W Contrast  Result Date: 08/16/2019 CLINICAL DATA:  Chest pain and shortness of breath. EXAM: CT CHEST, ABDOMEN, AND PELVIS WITH CONTRAST TECHNIQUE: Multidetector CT imaging of the chest, abdomen and pelvis was performed following the standard protocol during bolus administration of intravenous contrast. CONTRAST:  13m OMNIPAQUE IOHEXOL 300 MG/ML  SOLN COMPARISON:  None. FINDINGS: CT CHEST FINDINGS Cardiovascular: No significant vascular findings. Normal heart size. No pericardial effusion. Mediastinum/Nodes: Necrotic pretracheal subcarinal and AP  window lymph nodes are seen (the largest measures approximately 2.3 cm x 1.8 cm). Lungs/Pleura: A 6.7 cm x 4.8 cm heterogeneous low-attenuation lung mass is seen within the anteromedial aspect of the left lower lobe. This is most prominent within the region posterior to the left hilum. Numerous bilateral noncalcified lung nodules of various sizes are seen. There is no evidence of acute infiltrate, pleural effusion or pneumothorax. Musculoskeletal: Multilevel degenerative changes seen throughout the thoracic spine. CT ABDOMEN PELVIS FINDINGS Hepatobiliary: No focal liver abnormality is seen. No gallstones, gallbladder wall thickening, or biliary dilatation. Pancreas: Unremarkable. No pancreatic ductal dilatation or surrounding inflammatory changes. Spleen: Normal in size without focal abnormality. Adrenals/Urinary Tract: Adrenal glands are unremarkable. Kidneys are normal in size, without renal calculi or hydronephrosis. A 1.6 cm simple cyst is seen within the anterior aspect of the mid right kidney. Bladder is unremarkable. Stomach/Bowel: Stomach is within normal limits. The appendix is not clearly identified. No evidence of bowel wall thickening, distention, or inflammatory changes. Noninflamed diverticula are seen throughout the sigmoid colon. Vascular/Lymphatic: There is moderate severity calcification of the abdominal aorta. No enlarged abdominal or pelvic lymph nodes. Reproductive: Prostate radiation implantation seeds are seen with a moderately enlarged prostate gland. Other: No abdominal wall hernia or abnormality. No abdominopelvic ascites. Musculoskeletal: Multilevel degenerative changes seen throughout the lumbar spine. IMPRESSION: 1. 6.7 cm x 4.8 cm heterogeneous low-attenuation left lower lobe lung mass consistent with primary lung malignancy. 2. Numerous bilateral noncalcified lung nodules of various sizes, consistent with metastatic disease. 3. Necrotic pretracheal, subcarinal and AP window lymph  nodes. 4. Prostate radiation implantation seeds with a moderately enlarged prostate gland. 5. Noninflamed sigmoid diverticulosis. 6. Aortic atherosclerosis. Aortic Atherosclerosis (ICD10-I70.0). Electronically Signed   By: TVirgina NorfolkM.D.   On: 08/16/2019 20:33   CT ABDOMEN PELVIS W CONTRAST  Result Date: 08/16/2019 CLINICAL DATA:  Chest pain and shortness of breath. EXAM: CT CHEST, ABDOMEN, AND PELVIS WITH CONTRAST TECHNIQUE: Multidetector CT imaging of the chest, abdomen and pelvis was performed following the standard protocol during bolus administration of intravenous contrast. CONTRAST:  1057mOMNIPAQUE IOHEXOL 300 MG/ML  SOLN COMPARISON:  None. FINDINGS: Cardiovascular: No significant vascular findings. Normal heart size. No pericardial effusion. Mediastinum/Nodes: Necrotic pretracheal subcarinal and AP window lymph nodes are seen (the largest measures approximately 2.3 cm x 1.8 cm). Lungs/Pleura: A 6.7 cm x 4.8 cm heterogeneous low-attenuation lung mass is seen within the anteromedial aspect of the left lower lobe. This is most prominent within the region posterior to the left hilum. Numerous bilateral noncalcified lung nodules of various sizes are seen. There is no evidence of acute infiltrate, pleural effusion or pneumothorax. Musculoskeletal: Multilevel degenerative changes seen throughout the thoracic spine. CT ABDOMEN PELVIS FINDINGS Hepatobiliary: No focal liver abnormality is seen. No gallstones, gallbladder wall thickening, or biliary dilatation. Pancreas: Unremarkable. No pancreatic ductal dilatation or surrounding inflammatory changes. Spleen: Normal in size without focal abnormality. Adrenals/Urinary Tract: Adrenal glands are unremarkable. Kidneys are normal in size, without renal calculi or hydronephrosis. A 1.6 cm simple cyst is  seen within the anterior aspect of the mid right kidney. Bladder is unremarkable. Stomach/Bowel: Stomach is within normal limits. The appendix is not clearly  identified. No evidence of bowel wall thickening, distention, or inflammatory changes. Noninflamed diverticula are seen throughout the sigmoid colon. Vascular/Lymphatic: There is moderate severity calcification of the abdominal aorta. No enlarged abdominal or pelvic lymph nodes. Reproductive: Prostate radiation implantation seeds are seen with a moderately enlarged prostate gland. Other: No abdominal wall hernia or abnormality. No abdominopelvic ascites. Musculoskeletal: Multilevel degenerative changes seen throughout the lumbar spine. IMPRESSION: 1. 6.7 cm x 4.8 cm heterogeneous low-attenuation left lower lobe lung mass consistent with primary lung malignancy. 2. Numerous bilateral noncalcified lung nodules of various sizes, consistent with metastatic disease. 3. Necrotic pretracheal, subcarinal and AP window lymph nodes. 4. Prostate radiation implantation seeds with a moderately enlarged prostate gland. 5. Noninflamed sigmoid diverticulosis. 6. Aortic atherosclerosis. Aortic Atherosclerosis (ICD10-I70.0). Electronically Signed   By: Virgina Norfolk M.D.   On: 08/16/2019 20:33   DG CHEST PORT 1 VIEW  Result Date: 08/20/2019 CLINICAL DATA:  Status post bronchoscopy EXAM: PORTABLE CHEST 1 VIEW COMPARISON:  August 16, 2019 chest radiograph and chest CT FINDINGS: No appreciable pneumothorax. There is again noted a mass overlying the left hilum measuring 5.5 x 4.9 cm. Lungs elsewhere clear. Heart is upper normal in size with pulmonary vascularity normal. No adenopathy. No bone lesions. IMPRESSION: No pneumothorax. Masslike area overlying the left hilum seen on CT to reside in the superior segment left lower lobe region. Lungs otherwise clear. Stable cardiac silhouette. Electronically Signed   By: Lowella Grip III M.D.   On: 08/20/2019 12:03    Lab Results:  CBC    Component Value Date/Time   WBC 8.2 08/23/2019 0935   WBC 7.0 08/20/2019 0658   RBC 3.52 (L) 08/23/2019 0935   HGB 9.8 (L) 08/23/2019 0935    HGB 9.8 (L) 08/14/2019 0918   HCT 30.7 (L) 08/23/2019 0935   HCT 30.1 (L) 08/14/2019 0918   PLT 516 (H) 08/23/2019 0935   PLT 549 (H) 08/14/2019 0918   MCV 87.2 08/23/2019 0935   MCV 87 08/14/2019 0918   MCH 27.8 08/23/2019 0935   MCHC 31.9 08/23/2019 0935   RDW 12.1 08/23/2019 0935   RDW 11.8 08/14/2019 0918   LYMPHSABS 1.6 08/23/2019 0935   LYMPHSABS 1.8 08/14/2019 0918   MONOABS 0.5 08/23/2019 0935   EOSABS 0.1 08/23/2019 0935   EOSABS 0.3 08/14/2019 0918   BASOSABS 0.0 08/23/2019 0935   BASOSABS 0.0 08/14/2019 0918    BMET    Component Value Date/Time   NA 131 (L) 08/23/2019 0935   NA 132 (L) 08/14/2019 0918   K 4.0 08/23/2019 0935   CL 95 (L) 08/23/2019 0935   CO2 25 08/23/2019 0935   GLUCOSE 158 (H) 08/23/2019 0935   BUN 21 08/23/2019 0935   BUN 16 08/14/2019 0918   CREATININE 1.40 (H) 08/23/2019 0935   CREATININE 1.25 (H) 08/08/2016 0912   CALCIUM 9.8 08/23/2019 0935   GFRNONAA 47 (L) 08/23/2019 0935   GFRAA 55 (L) 08/23/2019 0935    BNP No results found for: BNP  ProBNP No results found for: PROBNP  Specialty Problems      Pulmonary Problems   Allergic rhinitis, mild   Mass of left lung      No Known Allergies  Immunization History  Administered Date(s) Administered  . Fluad Quad(high Dose 65+) 10/17/2018  . Influenza, High Dose Seasonal PF 12/02/2015, 12/12/2016, 11/02/2017  .  PFIZER SARS-COV-2 Vaccination 04/05/2019, 05/01/2019  . Pneumococcal Conjugate-13 06/25/2014  . Pneumococcal Polysaccharide-23 06/03/2010  . Tdap 12/06/2007  . Zoster 02/08/2008  . Zoster Recombinat (Shingrix) 02/25/2017, 04/26/2017    Past Medical History:  Diagnosis Date  . Allergic rhinitis   . Asthma   . Carotid stenosis   . Colon cancer ([place]) 2003  . Diabetes mellitus ([place])   . Diverticulosis   . Dyslipidemia   . ED (erectile dysfunction)   . GERD (gastroesophageal reflux disease)   . H/O degenerative disc disease   . Hemorrhoids   . HTN  (hypertension)   . Hyperlipidemia   . LVH (left ventricular hypertrophy)    on EKG  . Mass of lower lobe of left lung   . Mediastinal adenopathy   . Smoker    former  . Wears dentures   . Wears glasses     Tobacco History: Social History   Tobacco Use  Smoking Status Former Smoker  . Packs/day: 1.00  . Years: 32.00  . Pack years: 32.00  . Start date: 02/08/1955  . Quit date: 10/09/1987  . Years since quitting: 31.9  Smokeless Tobacco Never Used   Counseling given: Yes   Continue to not smoke  Outpatient Encounter Medications as of 08/27/2019  Medication Sig  . amLODipine (NORVASC) 5 MG tablet Take 1 tablet (5 mg total) by mouth daily. (Patient taking differently: Take 5 mg by mouth every evening. )  . aspirin EC 81 MG tablet Take 81 mg by mouth daily after breakfast.   . Blood Glucose Monitoring Suppl (ONETOUCH VERIO) w/Device KIT USE TO CHECK SUGAR DAILY  . Blood Glucose Monitoring Suppl (ONETOUCH VERIO) w/Device KIT USE TO CHECK SUGAR DAILY  . cholecalciferol (VITAMIN D) 1000 units tablet Take 1,000 Units by mouth daily after breakfast.   . Lancets (ONETOUCH DELICA PLUS [id number]) MISC PATIENT IS TO TEST TWO TIMES A DAY DX: E11.9 (ONETOUCH VERIO FLEX)  . lidocaine-prilocaine (EMLA) cream Apply to the Port-A-Cath site 30-60-minute before chemotherapy.  Marland Kitchen lisinopril-hydrochlorothiazide (ZESTORETIC) 20-12.5 MG tablet Take 1 tablet by mouth daily. (Patient taking differently: Take 1 tablet by mouth daily after breakfast. )  . metFORMIN (GLUCOPHAGE-XR) 500 MG 24 hr tablet TAKE 1 TABLET BY MOUTH EVERY DAY (Patient taking differently: Take 500 mg by mouth daily after breakfast. )  . methylPREDNISolone (MEDROL DOSEPAK) 4 MG TBPK tablet Use as instructed.  . Multiple Vitamin (MULTIVITAMIN WITH MINERALS) TABS tablet Take 1 tablet by mouth daily after breakfast.   . ONETOUCH VERIO test strip USE AS INSTRUCTED TO CHECK TWICE DAILY  . prochlorperazine (COMPAZINE) 10 MG tablet Take 1  tablet (10 mg total) by mouth every 6 (six) hours as needed for nausea or vomiting.  . simvastatin (ZOCOR) 40 MG tablet Take 1 tablet (40 mg total) by mouth daily. (Patient taking differently: Take 40 mg by mouth every evening. )   No facility-administered encounter medications on file as of 08/27/2019.     Review of Systems  Review of Systems  Constitutional: Positive for fatigue. Negative for activity change, chills, fever and unexpected weight change.  HENT: Negative for postnasal drip, rhinorrhea, sinus pressure, sinus pain and sore throat.   Eyes: Negative.   Respiratory: Positive for shortness of breath. Negative for cough and wheezing.   Cardiovascular: Negative for chest pain and palpitations.  Gastrointestinal: Negative for constipation, diarrhea, nausea and vomiting.  Endocrine: Negative.   Genitourinary: Negative.   Musculoskeletal: Negative.   Skin: Negative.   Neurological: Negative for  dizziness and headaches.  Psychiatric/Behavioral: Negative.  Negative for dysphoric mood. The patient is not nervous/anxious.   All other systems reviewed and are negative.    Physical Exam  BP 126/64 (BP Location: Left Arm, Cuff Size: Normal)   Pulse 94   Temp 97.8 F (36.6 C) (Oral)   Ht 5' 7.5" (1.715 m)   Wt 160 lb 3.2 oz (72.7 kg)   SpO2 98%   BMI 24.72 kg/m   Wt Readings from Last 5 Encounters:  08/27/19 160 lb 3.2 oz (72.7 kg)  08/23/19 161 lb 8 oz (73.3 kg)  08/20/19 162 lb (73.5 kg)  08/19/19 161 lb 12.8 oz (73.4 kg)  08/16/19 163 lb 12.8 oz (74.3 kg)    BMI Readings from Last 5 Encounters:  08/27/19 24.72 kg/m  08/23/19 25.29 kg/m  08/20/19 25.37 kg/m  08/19/19 24.97 kg/m  08/16/19 25.65 kg/m     Physical Exam Vitals and nursing note reviewed.  Constitutional:      General: He is not in acute distress.    Appearance: Normal appearance. He is normal weight.  HENT:     Head: Normocephalic and atraumatic.     Right Ear: Hearing, tympanic membrane,  ear canal and external ear normal. There is no impacted cerumen.     Left Ear: Hearing, tympanic membrane, ear canal and external ear normal. There is no impacted cerumen.     Ears:     Comments: Right hearing aid    Nose: Nose normal. No mucosal edema or rhinorrhea.     Right Turbinates: Not enlarged.     Left Turbinates: Not enlarged.     Mouth/Throat:     Mouth: Mucous membranes are dry.     Dentition: Normal dentition.     Pharynx: Oropharynx is clear. No oropharyngeal exudate.  Eyes:     Pupils: Pupils are equal, round, and reactive to light.  Cardiovascular:     Rate and Rhythm: Normal rate and regular rhythm.     Pulses: Normal pulses.     Heart sounds: Normal heart sounds. No murmur heard.   Pulmonary:     Effort: Pulmonary effort is normal.     Breath sounds: Normal breath sounds. No decreased breath sounds, wheezing or rales.  Musculoskeletal:     Cervical back: Normal range of motion.     Right lower leg: No edema.     Left lower leg: No edema.  Lymphadenopathy:     Cervical: No cervical adenopathy.  Skin:    General: Skin is warm and dry.     Capillary Refill: Capillary refill takes less than 2 seconds.     Findings: No erythema or rash.  Neurological:     General: No focal deficit present.     Mental Status: He is alert and oriented to person, place, and time.     Motor: No weakness.     Coordination: Coordination normal.     Gait: Gait is intact. Gait (Tolerated walk in office) normal.  Psychiatric:        Mood and Affect: Mood normal.        Behavior: Behavior normal. Behavior is cooperative.        Thought Content: Thought content normal.        Judgment: Judgment normal.       Assessment & Plan:   Former smoker, stopped smoking in distant past History of 32-pack-year smoker Stop smoking 1989  Plan: We will obtain pulmonary function testing  Mass of left  lung Left lower lobe lung mass Diagnosis squamous cell carcinoma Followed by Dr.  Earlie Server  Plan: Continue follow-up with oncology We will order pulmonary function testing to be performed sometime over the next 2 to 3 months 23-monthfollow-up with Dr. BLamonte SakaiWalk today in office, patient completed 3 laps with no oxygen desaturations on room air    Return in about 3 months (around 11/27/2019), or if symptoms worsen or fail to improve, for Follow up with Dr. BLamonte Sakai Follow up for FULL PFT - 60 min.   BLauraine Rinne NP 08/27/2019   This appointment required 32 minutes of patient care (this includes precharting, chart review, review of results, face-to-face care, etc.).

## 2019-08-27 NOTE — Assessment & Plan Note (Signed)
History of 32-pack-year smoker Stop smoking 1989  Plan: We will obtain pulmonary function testing

## 2019-08-27 NOTE — Telephone Encounter (Signed)
LVM for call back to schedule appointment with Dr. Lisbeth Renshaw.

## 2019-08-28 ENCOUNTER — Other Ambulatory Visit: Payer: Self-pay | Admitting: Internal Medicine

## 2019-08-28 ENCOUNTER — Telehealth: Payer: Self-pay | Admitting: Medical Oncology

## 2019-08-28 ENCOUNTER — Other Ambulatory Visit: Payer: Self-pay

## 2019-08-28 ENCOUNTER — Ambulatory Visit
Admission: RE | Admit: 2019-08-28 | Discharge: 2019-08-28 | Disposition: A | Discharge status: Home / Self Care | Payer: Medicare HMO | Source: Ambulatory Visit | Attending: Radiation Oncology | Admitting: Radiation Oncology

## 2019-08-28 ENCOUNTER — Encounter: Payer: Self-pay | Admitting: Radiation Oncology

## 2019-08-28 DIAGNOSIS — C3432 Malignant neoplasm of lower lobe, left bronchus or lung: Secondary | ICD-10-CM

## 2019-08-28 DIAGNOSIS — Z923 Personal history of irradiation: Secondary | ICD-10-CM | POA: Diagnosis not present

## 2019-08-28 DIAGNOSIS — Z510 Encounter for antineoplastic radiation therapy: Secondary | ICD-10-CM | POA: Diagnosis present

## 2019-08-28 DIAGNOSIS — Z7982 Long term (current) use of aspirin: Secondary | ICD-10-CM | POA: Diagnosis not present

## 2019-08-28 DIAGNOSIS — Z8546 Personal history of malignant neoplasm of prostate: Secondary | ICD-10-CM | POA: Insufficient documentation

## 2019-08-28 DIAGNOSIS — E785 Hyperlipidemia, unspecified: Secondary | ICD-10-CM | POA: Insufficient documentation

## 2019-08-28 DIAGNOSIS — Z801 Family history of malignant neoplasm of trachea, bronchus and lung: Secondary | ICD-10-CM | POA: Diagnosis not present

## 2019-08-28 DIAGNOSIS — Z87891 Personal history of nicotine dependence: Secondary | ICD-10-CM | POA: Insufficient documentation

## 2019-08-28 DIAGNOSIS — K5730 Diverticulosis of large intestine without perforation or abscess without bleeding: Secondary | ICD-10-CM | POA: Diagnosis not present

## 2019-08-28 DIAGNOSIS — E119 Type 2 diabetes mellitus without complications: Secondary | ICD-10-CM | POA: Insufficient documentation

## 2019-08-28 DIAGNOSIS — K219 Gastro-esophageal reflux disease without esophagitis: Secondary | ICD-10-CM | POA: Diagnosis not present

## 2019-08-28 DIAGNOSIS — M47816 Spondylosis without myelopathy or radiculopathy, lumbar region: Secondary | ICD-10-CM | POA: Insufficient documentation

## 2019-08-28 DIAGNOSIS — Z79899 Other long term (current) drug therapy: Secondary | ICD-10-CM | POA: Insufficient documentation

## 2019-08-28 DIAGNOSIS — Z7984 Long term (current) use of oral hypoglycemic drugs: Secondary | ICD-10-CM | POA: Insufficient documentation

## 2019-08-28 DIAGNOSIS — J45909 Unspecified asthma, uncomplicated: Secondary | ICD-10-CM | POA: Diagnosis not present

## 2019-08-28 MED ORDER — LORAZEPAM 0.5 MG PO TABS
ORAL_TABLET | ORAL | 0 refills | Status: DC
Start: 2019-08-28 — End: 2019-12-16

## 2019-08-28 NOTE — Progress Notes (Signed)
Radiation Oncology         [phone] ________________________________  Name: Shaun Kennedy        MRN: [id number]  Date of Service: 08/28/2019 DOB: [birth year]  HL:[id number], Shaun Jarvis, MD  Shaun Bears, MD     REFERRING PHYSICIAN: Curt Bears, MD   DIAGNOSIS: The encounter diagnosis was Malignant neoplasm of lower lobe of left lung ([place]).   HISTORY OF PRESENT ILLNESS: Shaun Kennedy is a 79 y.o. male seen at the request of Dr. Julien Kennedy for a newly diagnosed Stage IV NSCLC, squamous cell carcinoma of the LLL.  The patient had been experiencing coughing as well as shortness of breath and weight loss since the fall 2020, he also underwent esophageal dilation 1 week prior to presenting to the emergency room for worsening dyspnea on 08/16/2019, a chest x-ray revealed a mass in the left perihilar region, and a CT of the chest abdomen and pelvis revealed a 6.7 x 4.8 cm mass in the anterior medial aspect of the left lower lobe from minute within the region posterior to the left hilum, numerous bilateral Noncalcified lung nodules of varying sizes were seen, and a chronic pretracheal, subcarinal and AP window lymph nodes were identified.  His CT also identified implantation seeds from prior prostate radiotherapy, noninflamed sigmoid diverticulosis and stigmata of atherosclerotic disease in the aorta.  He underwent bronchoscopy on 08/20/2019 with Dr. Lamonte Kennedy for left lower lobe biopsy revealed a squamous cell carcinoma, and cytology revealed malignancy within the left lower lobe brushing, 4R lymph node.  His station 7 and 12 L node were negative for disease.  He is scheduled to undergo an PET scan on Friday, and MRI of the brain next Friday.  There were concerns of the tumor obstructing the left lower lobe, and he is seen today to discuss palliative radiotherapy.  Dr. Julien Kennedy is classifying his disease is stage IV given the calcified lung nodules.  He has been offered systemic therapy as well. He is seen today  to discuss the radiotherapy component.   PREVIOUS RADIATION THERAPY: Yes   2001 Radioactive Seed Implant: The patient underwent radioactive seed implant to the prostate.  PAST MEDICAL HISTORY:  Past Medical History:  Diagnosis Date  . Allergic rhinitis   . Asthma   . Carotid stenosis   . Colon cancer ([place]) 2003  . Diabetes mellitus ([place])   . Diverticulosis   . Dyslipidemia   . ED (erectile dysfunction)   . GERD (gastroesophageal reflux disease)   . H/O degenerative disc disease   . Hemorrhoids   . HTN (hypertension)   . Hyperlipidemia   . LVH (left ventricular hypertrophy)    on EKG  . Mass of lower lobe of left lung   . Mediastinal adenopathy   . Smoker    former  . Wears dentures   . Wears glasses        PAST SURGICAL HISTORY: Past Surgical History:  Procedure Laterality Date  . BRONCHIAL BIOPSY  08/20/2019   Procedure: BRONCHIAL BIOPSIES;  Surgeon: Shaun Gobble, MD;  Location: [facility] ENDOSCOPY;  Service: Pulmonary;;  . BRONCHIAL BRUSHINGS  08/20/2019   Procedure: BRONCHIAL BRUSHINGS;  Surgeon: Shaun Gobble, MD;  Location: [facility] ENDOSCOPY;  Service: Pulmonary;;  . BRONCHIAL NEEDLE ASPIRATION BIOPSY  08/20/2019   Procedure: BRONCHIAL NEEDLE ASPIRATION BIOPSIES;  Surgeon: Shaun Gobble, MD;  Location: [facility] ENDOSCOPY;  Service: Pulmonary;;  . CARPAL TUNNEL RELEASE Right 01/09/2019   Procedure: CARPAL TUNNEL RELEASE;  Surgeon: Shaun Shown  M, MD;  Location: [place];  Service: Orthopedics;  Laterality: Right;  . CATARACT EXTRACTION Right 2018  . COLONOSCOPY  2007   Dr. Benson Kennedy  . I & D EXTREMITY Right 04/02/2016   Procedure: IRRIGATION AND DEBRIDEMENT GREAT TOE;  Surgeon: Shaun Koyanagi, MD;  Location: [place];  Service: Orthopedics;  Laterality: Right;  . MULTIPLE TOOTH EXTRACTIONS    . RADIOACTIVE SEED IMPLANT  2003  . ULNAR TUNNEL RELEASE Right 01/09/2019   Procedure: RIGHT CUBITAL TUNNEL RELEASE AND CARPAL TUNNEL RELEASE;  Surgeon: Shaun Koyanagi, MD;   Location: [place];  Service: Orthopedics;  Laterality: Right;  . UPPER GASTROINTESTINAL ENDOSCOPY    . VIDEO BRONCHOSCOPY WITH ENDOBRONCHIAL ULTRASOUND N/A 08/20/2019   Procedure: VIDEO BRONCHOSCOPY WITH ENDOBRONCHIAL ULTRASOUND;  Surgeon: Shaun Gobble, MD;  Location: [facility] ENDOSCOPY;  Service: Pulmonary;  Laterality: N/A;     FAMILY HISTORY:  Family History  Problem Relation Age of Onset  . Heart failure Mother   . Diabetes Mother   . Stroke Father   . Diabetes Father   . Diabetes Sister   . Diabetes Sister   . Lung cancer Sister 19  . Colon cancer Neg Hx   . Esophageal cancer Neg Hx   . Rectal cancer Neg Hx   . Colon polyps Neg Hx   . Stomach cancer Neg Hx      SOCIAL HISTORY:  reports that he quit smoking about 31 years ago. He started smoking about 64 years ago. He has a 32.00 pack-year smoking history. He has never used smokeless tobacco. He reports that he does not drink alcohol and does not use drugs.  The patient is married and lives in [place].   ALLERGIES: Patient has no known allergies.   MEDICATIONS:  Current Outpatient Medications  Medication Sig Dispense Refill  . amLODipine (NORVASC) 5 MG tablet Take 1 tablet (5 mg total) by mouth daily. (Patient taking differently: Take 5 mg by mouth every evening. ) 90 tablet 3  . aspirin EC 81 MG tablet Take 81 mg by mouth daily after breakfast.     . Blood Glucose Monitoring Suppl (ONETOUCH VERIO) w/Device KIT USE TO CHECK SUGAR DAILY 1 kit 0  . Blood Glucose Monitoring Suppl (ONETOUCH VERIO) w/Device KIT USE TO CHECK SUGAR DAILY 1 kit 0  . cholecalciferol (VITAMIN D) 1000 units tablet Take 1,000 Units by mouth daily after breakfast.     . Lancets (ONETOUCH DELICA PLUS XIPJAS50N) MISC PATIENT IS TO TEST TWO TIMES A DAY DX: E11.9 (ONETOUCH VERIO FLEX) 100 each 12  . lidocaine-prilocaine (EMLA) cream Apply to the Port-A-Cath site 30-60-minute before chemotherapy. 30 g 0  . lisinopril-hydrochlorothiazide  (ZESTORETIC) 20-12.5 MG tablet Take 1 tablet by mouth daily. (Patient taking differently: Take 1 tablet by mouth daily after breakfast. ) 90 tablet 2  . metFORMIN (GLUCOPHAGE-XR) 500 MG 24 hr tablet TAKE 1 TABLET BY MOUTH EVERY DAY (Patient taking differently: Take 500 mg by mouth daily after breakfast. ) 90 tablet 1  . methylPREDNISolone (MEDROL DOSEPAK) 4 MG TBPK tablet Use as instructed. 21 tablet 0  . Multiple Vitamin (MULTIVITAMIN WITH MINERALS) TABS tablet Take 1 tablet by mouth daily after breakfast.     . ONETOUCH VERIO test strip USE AS INSTRUCTED TO CHECK TWICE DAILY 200 strip 5  . prochlorperazine (COMPAZINE) 10 MG tablet Take 1 tablet (10 mg total) by mouth every 6 (six) hours as needed for nausea or vomiting. 30 tablet 0  .  simvastatin (ZOCOR) 40 MG tablet Take 1 tablet (40 mg total) by mouth daily. (Patient taking differently: Take 40 mg by mouth every evening. ) 90 tablet 3   No current facility-administered medications for this encounter.     REVIEW OF SYSTEMS: On review of systems, the patient reports that he is doing fairly well. He reports he has been having shortness of breath, productive yellow mucous without blood, and continuous cough. He denies any chest pain, fevers, chills, night sweats, unintended weight changes. He denies any bowel or bladder disturbances, and denies abdominal pain, nausea or vomiting. He denies any new musculoskeletal or joint aches or pains. A complete review of systems is obtained and is otherwise negative.     PHYSICAL EXAM:  Wt Readings from Last 3 Encounters:  08/28/19 155 lb 12.8 oz (70.7 kg)  08/27/19 160 lb 3.2 oz (72.7 kg)  08/23/19 161 lb 8 oz (73.3 kg)   Temp Readings from Last 3 Encounters:  08/28/19 98.4 F (36.9 C)  08/27/19 97.8 F (36.6 C) (Oral)  08/23/19 97.7 F (36.5 C) (Temporal)   BP Readings from Last 3 Encounters:  08/28/19 124/64  08/27/19 126/64  08/23/19 100/64   Pulse Readings from Last 3 Encounters:   08/28/19 89  08/27/19 94  08/23/19 97    /10  In general this is a well appearing African American male in no acute distress. He's alert and oriented x4 and appropriate throughout the examination. Cardiopulmonary assessment is negative for acute distress and he exhibits normal effort. Chest is clear on the right upper fields to auscultation but coarse breath sounds are noted in the right base, and coarse breath sounds in left upper lung fields and rhonchi at the left base with decreased breath sounds.    ECOG = 1  0 - Asymptomatic (Fully active, able to carry on all predisease activities without restriction)  1 - Symptomatic but completely ambulatory (Restricted in physically strenuous activity but ambulatory and able to carry out work of a light or sedentary nature. For example, light housework, office work)  2 - Symptomatic, <50% in bed during the day (Ambulatory and capable of all self care but unable to carry out any work activities. Up and about more than 50% of waking hours)  3 - Symptomatic, >50% in bed, but not bedbound (Capable of only limited self-care, confined to bed or chair 50% or more of waking hours)  4 - Bedbound (Completely disabled. Cannot carry on any self-care. Totally confined to bed or chair)  5 - Death   Eustace Pen MM, Creech RH, Tormey DC, et al. [phone]). "Toxicity and response criteria of the [facility]". [place] Oncol. 5 (6): 649-55    LABORATORY DATA:  Lab Results  Component Value Date   WBC 8.2 08/23/2019   HGB 9.8 (L) 08/23/2019   HCT 30.7 (L) 08/23/2019   MCV 87.2 08/23/2019   PLT 516 (H) 08/23/2019   Lab Results  Component Value Date   NA 131 (L) 08/23/2019   K 4.0 08/23/2019   CL 95 (L) 08/23/2019   CO2 25 08/23/2019   Lab Results  Component Value Date   ALT 11 08/23/2019   AST 14 (L) 08/23/2019   ALKPHOS 63 08/23/2019   BILITOT 0.6 08/23/2019      RADIOGRAPHY: DG Chest 2 View  Result Date:  08/16/2019 CLINICAL DATA:  Shortness of breath, reported esophageal dilation 2 weeks prior EXAM: CHEST - 2 VIEW COMPARISON:  Radiograph 02/04/2015 FINDINGS: Focal  masslike opacity in the left perihilar region, could reflect inflection or sequela of aspiration given recent esophageal manipulation however an underlying neoplasm is not excluded given these contours. No pneumothorax or effusion. No convincing features of edema. No acute osseous or soft tissue abnormality. Degenerative changes are present in the imaged spine and shoulders. Telemetry leads overlie the chest. IMPRESSION: Focal masslike opacity in the left perihilar region, could reflect inflection or sequela of aspiration given recent esophageal manipulation however an underlying neoplasm is not excluded given these contours. Recommend further evaluation with contrast enhanced CT. Electronically Signed   By: Lovena Le Kennedy.D.   On: 08/16/2019 19:36   CT Chest W Contrast  Result Date: 08/16/2019 CLINICAL DATA:  Chest pain and shortness of breath. EXAM: CT CHEST, ABDOMEN, AND PELVIS WITH CONTRAST TECHNIQUE: Multidetector CT imaging of the chest, abdomen and pelvis was performed following the standard protocol during bolus administration of intravenous contrast. CONTRAST:  115m OMNIPAQUE IOHEXOL 300 MG/ML  SOLN COMPARISON:  None. FINDINGS: CT CHEST FINDINGS Cardiovascular: No significant vascular findings. Normal heart size. No pericardial effusion. Mediastinum/Nodes: Necrotic pretracheal subcarinal and AP window lymph nodes are seen (the largest measures approximately 2.3 cm x 1.8 cm). Lungs/Pleura: A 6.7 cm x 4.8 cm heterogeneous low-attenuation lung mass is seen within the anteromedial aspect of the left lower lobe. This is most prominent within the region posterior to the left hilum. Numerous bilateral noncalcified lung nodules of various sizes are seen. There is no evidence of acute infiltrate, pleural effusion or pneumothorax. Musculoskeletal:  Multilevel degenerative changes seen throughout the thoracic spine. CT ABDOMEN PELVIS FINDINGS Hepatobiliary: No focal liver abnormality is seen. No gallstones, gallbladder wall thickening, or biliary dilatation. Pancreas: Unremarkable. No pancreatic ductal dilatation or surrounding inflammatory changes. Spleen: Normal in size without focal abnormality. Adrenals/Urinary Tract: Adrenal glands are unremarkable. Kidneys are normal in size, without renal calculi or hydronephrosis. A 1.6 cm simple cyst is seen within the anterior aspect of the mid right kidney. Bladder is unremarkable. Stomach/Bowel: Stomach is within normal limits. The appendix is not clearly identified. No evidence of bowel wall thickening, distention, or inflammatory changes. Noninflamed diverticula are seen throughout the sigmoid colon. Vascular/Lymphatic: There is moderate severity calcification of the abdominal aorta. No enlarged abdominal or pelvic lymph nodes. Reproductive: Prostate radiation implantation seeds are seen with a moderately enlarged prostate gland. Other: No abdominal wall hernia or abnormality. No abdominopelvic ascites. Musculoskeletal: Multilevel degenerative changes seen throughout the lumbar spine. IMPRESSION: 1. 6.7 cm x 4.8 cm heterogeneous low-attenuation left lower lobe lung mass consistent with primary lung malignancy. 2. Numerous bilateral noncalcified lung nodules of various sizes, consistent with metastatic disease. 3. Necrotic pretracheal, subcarinal and AP window lymph nodes. 4. Prostate radiation implantation seeds with a moderately enlarged prostate gland. 5. Noninflamed sigmoid diverticulosis. 6. Aortic atherosclerosis. Aortic Atherosclerosis (ICD10-I70.0). Electronically Signed   By: TVirgina NorfolkM.D.   On: 08/16/2019 20:33   CT ABDOMEN PELVIS W CONTRAST  Result Date: 08/16/2019 CLINICAL DATA:  Chest pain and shortness of breath. EXAM: CT CHEST, ABDOMEN, AND PELVIS WITH CONTRAST TECHNIQUE: Multidetector CT  imaging of the chest, abdomen and pelvis was performed following the standard protocol during bolus administration of intravenous contrast. CONTRAST:  1013mOMNIPAQUE IOHEXOL 300 MG/ML  SOLN COMPARISON:  None. FINDINGS: Cardiovascular: No significant vascular findings. Normal heart size. No pericardial effusion. Mediastinum/Nodes: Necrotic pretracheal subcarinal and AP window lymph nodes are seen (the largest measures approximately 2.3 cm x 1.8 cm). Lungs/Pleura: A 6.7 cm x 4.8 cm heterogeneous  low-attenuation lung mass is seen within the anteromedial aspect of the left lower lobe. This is most prominent within the region posterior to the left hilum. Numerous bilateral noncalcified lung nodules of various sizes are seen. There is no evidence of acute infiltrate, pleural effusion or pneumothorax. Musculoskeletal: Multilevel degenerative changes seen throughout the thoracic spine. CT ABDOMEN PELVIS FINDINGS Hepatobiliary: No focal liver abnormality is seen. No gallstones, gallbladder wall thickening, or biliary dilatation. Pancreas: Unremarkable. No pancreatic ductal dilatation or surrounding inflammatory changes. Spleen: Normal in size without focal abnormality. Adrenals/Urinary Tract: Adrenal glands are unremarkable. Kidneys are normal in size, without renal calculi or hydronephrosis. A 1.6 cm simple cyst is seen within the anterior aspect of the mid right kidney. Bladder is unremarkable. Stomach/Bowel: Stomach is within normal limits. The appendix is not clearly identified. No evidence of bowel wall thickening, distention, or inflammatory changes. Noninflamed diverticula are seen throughout the sigmoid colon. Vascular/Lymphatic: There is moderate severity calcification of the abdominal aorta. No enlarged abdominal or pelvic lymph nodes. Reproductive: Prostate radiation implantation seeds are seen with a moderately enlarged prostate gland. Other: No abdominal wall hernia or abnormality. No abdominopelvic ascites.  Musculoskeletal: Multilevel degenerative changes seen throughout the lumbar spine. IMPRESSION: 1. 6.7 cm x 4.8 cm heterogeneous low-attenuation left lower lobe lung mass consistent with primary lung malignancy. 2. Numerous bilateral noncalcified lung nodules of various sizes, consistent with metastatic disease. 3. Necrotic pretracheal, subcarinal and AP window lymph nodes. 4. Prostate radiation implantation seeds with a moderately enlarged prostate gland. 5. Noninflamed sigmoid diverticulosis. 6. Aortic atherosclerosis. Aortic Atherosclerosis (ICD10-I70.0). Electronically Signed   By: Virgina Norfolk Kennedy.D.   On: 08/16/2019 20:33   DG CHEST PORT 1 VIEW  Result Date: 08/20/2019 CLINICAL DATA:  Status post bronchoscopy EXAM: PORTABLE CHEST 1 VIEW COMPARISON:  August 16, 2019 chest radiograph and chest CT FINDINGS: No appreciable pneumothorax. There is again noted a mass overlying the left hilum measuring 5.5 x 4.9 cm. Lungs elsewhere clear. Heart is upper normal in size with pulmonary vascularity normal. No adenopathy. No bone lesions. IMPRESSION: No pneumothorax. Masslike area overlying the left hilum seen on CT to reside in the superior segment left lower lobe region. Lungs otherwise clear. Stable cardiac silhouette. Electronically Signed   By: Lowella Grip III Kennedy.D.   On: 08/20/2019 12:03       IMPRESSION/PLAN: 1. Stage IV NSCLC, squamous cell carcinoma of the LLL. Dr. Lisbeth Renshaw discusses the pathology findings and reviews the nature of advanced stage lung cancer and the rationale to consider palliative radiotherapy to improve his quality of life by relieving his obstructed airway. He is planning palliative systemic therapy and has been trying to reach the medical oncology team. We will notify them while he is here. We discussed the risks, benefits, short, and long term effects of radiotherapy, and the patient is interested in proceeding. Dr. Lisbeth Renshaw discusses the delivery and logistics of radiotherapy and  anticipates a course of 3 weeks of radiotherapy with plans for chemotherapy. Written consent is obtained and placed in the chart, a copy was provided to the patient. He will simulate today and we anticipate beginning treatment on Monday. 2. Remote history of prostate cancer. The patient will follow up with urology as needed and with PCP for PSA checks.  In a visit lasting 60 minutes, greater than 50% of the time was spent face to face discussing the patient's condition, in preparation for the discussion, and coordinating the patient's care.   The above documentation reflects my direct findings  during this shared patient visit. Please see the separate note by Dr. Lisbeth Renshaw on this date for the remainder of the patient's plan of care.    Carola Rhine, PAC

## 2019-08-28 NOTE — Telephone Encounter (Signed)
ATC Annie, NA and no option to leave msg

## 2019-08-28 NOTE — Progress Notes (Signed)
Thoracic Location of Tumor / Histology: Left Lower Lobe- Stage IV non small cell lung cancer, squamous cell carcinoma  Patient presented with complaints of cough, shortness of breath and weight loss since November 2020.  MRI Brain 09/06/2019:  PET 09/06/2019:  Bronchoscopy: Malignant cells  CT CAP 08/16/2019: 6.7 x 4.8 cm heterogeneous low attenuation left lower lobe lung mass consistent with primary lung malignancy.  There was also numerous bilateral noncalcified lung nodules of various sizes consistent with metastatic disease.  There was necrotic pretracheal, subcarinal and AP window lymph nodes.    Chest x ray 08/16/2019: focal mass like opacity in the left perihilar region suspicious for infection or sequela of aspiration giving the recent esophageal manipulation.  Biopsies of Left Lower Lobe Lung 08/20/2019    Tobacco/Marijuana/Snuff/ETOH use: Former smoker, quit in 1986.  Past/Anticipated interventions by cardiothoracic surgery, if any:   Past/Anticipated interventions by medical oncology, if any:  Dr. Julien Nordmann 08/23/2019 -I recommended for the patient to complete the staging work-up by ordering MRI of the brain as well as a PET scan to rule out any other metastatic disease. -I explained to the patient that he has incurable condition and all the treatment will be of palliative nature. -The patient is interested in treatment but he would like to discuss his treatment options with his family before making a decision. They will call the office in the next few days with her decision and we expect to start the patient on treatment within 1 week of their decision. -I will refer the patient to radiation oncology for consideration of short course of palliative radiotherapy to the obstructive left lower lobe lung mass.  Signs/Symptoms  Weight changes, if any: Lost about 80 pounds in the last year.  Respiratory complaints, if any: Wheezing, SOB  Hemoptysis, if any: Productive with clear  sputum.  He states he no longer has hemoptysis.  Pain issues, if any:  Mild pain in his right chest/flank 4/10.   SAFETY ISSUES:  Prior radiation? No  Pacemaker/ICD? No  Possible current pregnancy? n/a  Is the patient on methotrexate? No  Current Complaints / other details:   -History of prostate cancer s/p seed implant 2001 -Takes Metformin

## 2019-08-28 NOTE — Telephone Encounter (Signed)
Pt stated he wants to get treated with the 2 cycles of chemo and then switch to immunotherapy.Last xrt is approx 8/13.   Asking when he will start .   Emla cream sent to pharmacy Schedule message sent for chemo class.

## 2019-08-28 NOTE — Telephone Encounter (Signed)
Done.  He can start next week.

## 2019-08-29 ENCOUNTER — Other Ambulatory Visit: Payer: Self-pay | Admitting: Radiology

## 2019-08-29 ENCOUNTER — Telehealth: Payer: Self-pay | Admitting: Medical Oncology

## 2019-08-29 NOTE — Progress Notes (Signed)
Pharmacist Chemotherapy Monitoring - Initial Assessment    Anticipated start date: 09/04/19  Regimen:  . Are orders appropriate based on the patient's diagnosis, regimen, and cycle? Yes . Does the plan date match the patient's scheduled date? Yes . Is the sequencing of drugs appropriate? Yes . Are the premedications appropriate for the patient's regimen? Yes . Prior Authorization for treatment is: Pending o If applicable, is the correct biosimilar selected based on the patient's insurance? not applicable  Organ Function and Labs: Marland Kitchen Are dose adjustments needed based on the patient's renal function, hepatic function, or hematologic function? Yes . Are appropriate labs ordered prior to the start of patient's treatment? Yes . Other organ system assessment, if indicated: N/A . The following baseline labs, if indicated, have been ordered: ipilimumab: baseline TSH +/- T4  Dose Assessment: . Are the drug doses appropriate? Yes . Are the following correct: o Drug concentrations Yes o IV fluid compatible with drug Yes o Administration routes Yes o Timing of therapy Yes . If applicable, does the patient have documented access for treatment and/or plans for port-a-cath placement? yes . If applicable, have lifetime cumulative doses been properly documented and assessed? not applicable Lifetime Dose Tracking  No doses have been documented on this patient for the following tracked chemicals: Doxorubicin, Epirubicin, Idarubicin, Daunorubicin, Mitoxantrone, Bleomycin, Oxaliplatin, Carboplatin, Liposomal Doxorubicin  o   Toxicity Monitoring/Prevention: . The patient has the following take home antiemetics prescribed: Prochlorperazine . The patient has the following take home medications prescribed: N/A . Medication allergies and previous infusion related reactions, if applicable, have been reviewed and addressed. Yes . The patient's current medication list has been assessed for drug-drug interactions  with their chemotherapy regimen. no significant drug-drug interactions were identified on review.  Order Review: . Are the treatment plan orders signed? Yes . Is the patient scheduled to see a provider prior to their treatment? Yes  I verify that I have reviewed each item in the above checklist and answered each question accordingly.  Philomena Course 08/29/2019 1:18 PM

## 2019-08-29 NOTE — Telephone Encounter (Signed)
Attempted to call Deneise Lever. Call was unanswered and there no option to leave voice. Per triage protocol encounter will be closed.

## 2019-08-29 NOTE — Telephone Encounter (Signed)
Wife notified.

## 2019-08-29 NOTE — Telephone Encounter (Signed)
-----   Message from Curt Bears, MD sent at 08/28/2019 10:20 PM EDT ----- Regarding: RE: claustrophobia I did send Ativan to his pharmacy.  Thank you. ----- Message ----- From: Ardeen Garland, RN Sent: 08/28/2019   1:31 PM EDT To: Curt Bears, MD Subject: claustrophobia                                 He said he has claustrophobia and would like something to help him with his MRI on 7/30.  He said he has taken something in the past but could not tell me what the name was, said it was a small pill.

## 2019-08-30 ENCOUNTER — Ambulatory Visit (HOSPITAL_COMMUNITY)
Admission: RE | Admit: 2019-08-30 | Discharge: 2019-08-30 | Disposition: A | Discharge status: Home / Self Care | Payer: Medicare HMO | Source: Ambulatory Visit | Attending: Internal Medicine | Admitting: Internal Medicine

## 2019-08-30 ENCOUNTER — Other Ambulatory Visit: Payer: Self-pay | Admitting: Internal Medicine

## 2019-08-30 ENCOUNTER — Other Ambulatory Visit: Payer: Self-pay

## 2019-08-30 ENCOUNTER — Encounter (HOSPITAL_COMMUNITY): Payer: Self-pay

## 2019-08-30 ENCOUNTER — Telehealth: Payer: Self-pay | Admitting: Internal Medicine

## 2019-08-30 DIAGNOSIS — Z87891 Personal history of nicotine dependence: Secondary | ICD-10-CM | POA: Insufficient documentation

## 2019-08-30 DIAGNOSIS — I10 Essential (primary) hypertension: Secondary | ICD-10-CM | POA: Insufficient documentation

## 2019-08-30 DIAGNOSIS — Z7982 Long term (current) use of aspirin: Secondary | ICD-10-CM | POA: Insufficient documentation

## 2019-08-30 DIAGNOSIS — I6529 Occlusion and stenosis of unspecified carotid artery: Secondary | ICD-10-CM | POA: Insufficient documentation

## 2019-08-30 DIAGNOSIS — K219 Gastro-esophageal reflux disease without esophagitis: Secondary | ICD-10-CM | POA: Insufficient documentation

## 2019-08-30 DIAGNOSIS — C3432 Malignant neoplasm of lower lobe, left bronchus or lung: Secondary | ICD-10-CM | POA: Diagnosis not present

## 2019-08-30 DIAGNOSIS — Z510 Encounter for antineoplastic radiation therapy: Secondary | ICD-10-CM | POA: Diagnosis not present

## 2019-08-30 DIAGNOSIS — Z7984 Long term (current) use of oral hypoglycemic drugs: Secondary | ICD-10-CM | POA: Diagnosis not present

## 2019-08-30 DIAGNOSIS — Z79899 Other long term (current) drug therapy: Secondary | ICD-10-CM | POA: Diagnosis not present

## 2019-08-30 DIAGNOSIS — C3490 Malignant neoplasm of unspecified part of unspecified bronchus or lung: Secondary | ICD-10-CM

## 2019-08-30 DIAGNOSIS — E785 Hyperlipidemia, unspecified: Secondary | ICD-10-CM | POA: Insufficient documentation

## 2019-08-30 DIAGNOSIS — Z452 Encounter for adjustment and management of vascular access device: Secondary | ICD-10-CM | POA: Diagnosis not present

## 2019-08-30 DIAGNOSIS — E119 Type 2 diabetes mellitus without complications: Secondary | ICD-10-CM | POA: Diagnosis not present

## 2019-08-30 HISTORY — PX: IR IMAGING GUIDED PORT INSERTION: IMG5740

## 2019-08-30 LAB — GLUCOSE, CAPILLARY: Glucose-Capillary: 83 mg/dL (ref 70–99)

## 2019-08-30 MED ORDER — MIDAZOLAM HCL 2 MG/2ML IJ SOLN
INTRAMUSCULAR | Status: AC | PRN
  Administered 2019-08-30: 1 mg via INTRAVENOUS

## 2019-08-30 MED ORDER — HEPARIN SOD (PORK) LOCK FLUSH 100 UNIT/ML IV SOLN
INTRAVENOUS | Status: AC
  Filled 2019-08-30: qty 5

## 2019-08-30 MED ORDER — LIDOCAINE-EPINEPHRINE 1 %-1:100000 IJ SOLN
INTRAMUSCULAR | Status: AC
  Filled 2019-08-30: qty 1

## 2019-08-30 MED ORDER — FENTANYL CITRATE (PF) 100 MCG/2ML IJ SOLN
INTRAMUSCULAR | Status: AC | PRN
  Administered 2019-08-30 (×2): 25 ug via INTRAVENOUS

## 2019-08-30 MED ORDER — CEFAZOLIN SODIUM-DEXTROSE 2-4 GM/100ML-% IV SOLN
2.0000 g | INTRAVENOUS | Status: AC
  Administered 2019-08-30: 2 g via INTRAVENOUS

## 2019-08-30 MED ORDER — SODIUM CHLORIDE 0.9 % IV SOLN: INTRAVENOUS | Status: DC

## 2019-08-30 MED ORDER — MIDAZOLAM HCL 2 MG/2ML IJ SOLN
INTRAMUSCULAR | Status: AC
  Filled 2019-08-30: qty 2

## 2019-08-30 MED ORDER — LIDOCAINE HCL 1 % IJ SOLN
INTRAMUSCULAR | Status: AC
  Administered 2019-08-30: 10 mL
  Filled 2019-08-30: qty 20

## 2019-08-30 MED ORDER — CEFAZOLIN SODIUM-DEXTROSE 2-4 GM/100ML-% IV SOLN
INTRAVENOUS | Status: AC
  Filled 2019-08-30: qty 100

## 2019-08-30 MED ORDER — FENTANYL CITRATE (PF) 100 MCG/2ML IJ SOLN
INTRAMUSCULAR | Status: AC
  Filled 2019-08-30: qty 2

## 2019-08-30 NOTE — Discharge Instructions (Addendum)
For questions /concerns may call Interventional Radiology at [phone]  You may remove your dressing and shower tomorrow afternoon  DO NOT use EMLA cream for 2 weeks after port placement as the cream will remove surgical glue on your incision.     Implanted Port Insertion, Care After This sheet gives you information about how to care for yourself after your procedure. Your health care provider may also give you more specific instructions. If you have problems or questions, contact your health care provider. What can I expect after the procedure? After the procedure, it is common to have:  Discomfort at the port insertion site.  Bruising on the skin over the port. This should improve over 3-4 days. Follow these instructions at home: [facility] care  After your port is placed, you will get a manufacturer's information card. The card has information about your port. Keep this card with you at all times.  Take care of the port as told by your health care provider. Ask your health care provider if you or a family member can get training for taking care of the port at home. A home health care nurse may also take care of the port.  Make sure to remember what type of port you have. Incision care      Follow instructions from your health care provider about how to take care of your port insertion site. Make sure you: ? Wash your hands with soap and water before and after you change your bandage (dressing). If soap and water are not available, use hand sanitizer. ? Change your dressing as told by your health care provider. ? Leave stitches (sutures), skin glue, or adhesive strips in place. These skin closures may need to stay in place for 2 weeks or longer. If adhesive strip edges start to loosen and curl up, you may trim the loose edges. Do not remove adhesive strips completely unless your health care provider tells you to do that.  Check your port insertion site every day for signs of  infection. Check for: ? Redness, swelling, or pain. ? Fluid or blood. ? Warmth. ? Pus or a bad smell. Activity  Return to your normal activities as told by your health care provider. Ask your health care provider what activities are safe for you.  Do not lift anything that is heavier than 10 lb (4.5 kg), or the limit that you are told, until your health care provider says that it is safe. General instructions  Take over-the-counter and prescription medicines only as told by your health care provider.  Do not take baths, swim, or use a hot tub until your health care provider approves. Ask your health care provider if you may take showers. You may only be allowed to take sponge baths.  Do not drive for 24 hours if you were given a sedative during your procedure.  Wear a medical alert bracelet in case of an emergency. This will tell any health care providers that you have a port.  Keep all follow-up visits as told by your health care provider. This is important. Contact a health care provider if:  You cannot flush your port with saline as directed, or you cannot draw blood from the port.  You have a fever or chills.  You have redness, swelling, or pain around your port insertion site.  You have fluid or blood coming from your port insertion site.  Your port insertion site feels warm to the touch.  You have pus or a  bad smell coming from the port insertion site. Get help right away if:  You have chest pain or shortness of breath.  You have bleeding from your port that you cannot control. Summary  Take care of the port as told by your health care provider. Keep the manufacturer's information card with you at all times.  Change your dressing as told by your health care provider.  Contact a health care provider if you have a fever or chills or if you have redness, swelling, or pain around your port insertion site.  Keep all follow-up visits as told by your health care  provider. This information is not intended to replace advice given to you by your health care provider. Make sure you discuss any questions you have with your health care provider. Document Revised: 08/22/2017 Document Reviewed: 08/22/2017 Elsevier Patient Education  [place] An implanted port is a device that is placed under the skin. It is usually placed in the chest. The device can be used to give IV medicine, to take blood, or for dialysis. You may have an implanted port if:  You need IV medicine that would be irritating to the small veins in your hands or arms.  You need IV medicines, such as antibiotics, for a long period of time.  You need IV nutrition for a long period of time.  You need dialysis. Having a port means that your health care provider will not need to use the veins in your arms for these procedures. You may have fewer limitations when using a port than you would if you used other types of long-term IVs, and you will likely be able to return to normal activities after your incision heals. An implanted port has two main parts:  Reservoir. The reservoir is the part where a needle is inserted to give medicines or draw blood. The reservoir is round. After it is placed, it appears as a small, raised area under your skin.  Catheter. The catheter is a thin, flexible tube that connects the reservoir to a vein. Medicine that is inserted into the reservoir goes into the catheter and then into the vein. How is my port accessed? To access your port:  A numbing cream may be placed on the skin over the port site.  Your health care provider will put on a mask and sterile gloves.  The skin over your port will be cleaned carefully with a germ-killing soap and allowed to dry.  Your health care provider will gently pinch the port and insert a needle into it.  Your health care provider will check for a blood return to make sure the port is in  the vein and is not clogged.  If your port needs to remain accessed to get medicine continuously (constant infusion), your health care provider will place a clear bandage (dressing) over the needle site. The dressing and needle will need to be changed every week, or as told by your health care provider. What is flushing? Flushing helps keep the port from getting clogged. Follow instructions from your health care provider about how and when to flush the port. Ports are usually flushed with saline solution or a medicine called heparin. The need for flushing will depend on how the port is used:  If the port is only used from time to time to give medicines or draw blood, the port may need to be flushed: ? Before and after medicines have been given. ?  Before and after blood has been drawn. ? As part of routine maintenance. Flushing may be recommended every 4-6 weeks.  If a constant infusion is running, the port may not need to be flushed.  Throw away any syringes in a disposal container that is meant for sharp items (sharps container). You can buy a sharps container from a pharmacy, or you can make one by using an empty hard plastic bottle with a cover. How long will my port stay implanted? The port can stay in for as long as your health care provider thinks it is needed. When it is time for the port to come out, a surgery will be done to remove it. The surgery will be similar to the procedure that was done to put the port in. Follow these instructions at home:   Flush your port as told by your health care provider.  If you need an infusion over several days, follow instructions from your health care provider about how to take care of your port site. Make sure you: ? Wash your hands with soap and water before you change your dressing. If soap and water are not available, use alcohol-based hand sanitizer. ? Change your dressing as told by your health care provider. ? Place any used dressings or  infusion bags into a plastic bag. Throw that bag in the trash. ? Keep the dressing that covers the needle clean and dry. Do not get it wet. ? Do not use scissors or sharp objects near the tube. ? Keep the tube clamped, unless it is being used.  Check your port site every day for signs of infection. Check for: ? Redness, swelling, or pain. ? Fluid or blood. ? Pus or a bad smell.  Protect the skin around the port site. ? Avoid wearing bra straps that rub or irritate the site. ? Protect the skin around your port from seat belts. Place a soft pad over your chest if needed.  Bathe or shower as told by your health care provider. The site may get wet as long as you are not actively receiving an infusion.  Return to your normal activities as told by your health care provider. Ask your health care provider what activities are safe for you.  Carry a medical alert card or wear a medical alert bracelet at all times. This will let health care providers know that you have an implanted port in case of an emergency. Get help right away if:  You have redness, swelling, or pain at the port site.  You have fluid or blood coming from your port site.  You have pus or a bad smell coming from the port site.  You have a fever. Summary  Implanted ports are usually placed in the chest for long-term IV access.  Follow instructions from your health care provider about flushing the port and changing bandages (dressings).  Take care of the area around your port by avoiding clothing that puts pressure on the area, and by watching for signs of infection.  Protect the skin around your port from seat belts. Place a soft pad over your chest if needed.  Get help right away if you have a fever or you have redness, swelling, pain, drainage, or a bad smell at the port site. This information is not intended to replace advice given to you by your health care provider. Make sure you discuss any questions you have with  your health care provider. Document Revised: 05/18/2018 Document Reviewed: 02/27/2016 Elsevier  Patient Education  [place]. Moderate Conscious Sedation, Adult, Care After These instructions provide you with information about caring for yourself after your procedure. Your health care provider may also give you more specific instructions. Your treatment has been planned according to current medical practices, but problems sometimes occur. Call your health care provider if you have any problems or questions after your procedure. What can I expect after the procedure? After your procedure, it is common:  To feel sleepy for several hours.  To feel clumsy and have poor balance for several hours.  To have poor judgment for several hours.  To vomit if you eat too soon. Follow these instructions at home: For at least 24 hours after the procedure:   Do not: ? Participate in activities where you could fall or become injured. ? Drive. ? Use heavy machinery. ? Drink alcohol. ? Take sleeping pills or medicines that cause drowsiness. ? Make important decisions or sign legal documents. ? Take care of children on your own.  Rest. Eating and drinking  Follow the diet recommended by your health care provider.  If you vomit: ? Drink water, juice, or soup when you can drink without vomiting. ? Make sure you have little or no nausea before eating solid foods. General instructions  Have a responsible adult stay with you until you are awake and alert.  Take over-the-counter and prescription medicines only as told by your health care provider.  If you smoke, do not smoke without supervision.  Keep all follow-up visits as told by your health care provider. This is important. Contact a health care provider if:  You keep feeling nauseous or you keep vomiting.  You feel light-headed.  You develop a rash.  You have a fever. Get help right away if:  You have trouble  breathing. This information is not intended to replace advice given to you by your health care provider. Make sure you discuss any questions you have with your health care provider. Document Revised: 01/06/2017 Document Reviewed: 05/16/2015 Elsevier Patient Education  2020 [employer].

## 2019-08-30 NOTE — H&P (Signed)
Chief Complaint: Patient was seen in consultation today for lung cancer  Referring Physician(s): Mohamed,Mohamed  Supervising Physician: Corrie Mckusick  Patient Status: [facility] - Out-pt  History of Present Illness: Shaun Kennedy is a 79 y.o. male with past medical history of DM, GERD, HTN, remote colon cancer presents with  newly diagnosed lung cancer.  He has upcoming plans for chemoradiation.  IR consulted for placement of Port-A-Cath by Dr. Julien Nordmann.  Shaun Kennedy is assessed in [facility] Radiology prior to his procedure today.  He denies fever, chills, nausea, vomiting, abdominal pain, dysuria.  He is otherwise in his usual state of health.  He lives at home with his wife who helps with any needs at home.  He has been NPO.  He does not take blood thinners.  Past Medical History:  Diagnosis Date  . Allergic rhinitis   . Asthma   . Carotid stenosis   . Colon cancer ([place]) 2003  . Diabetes mellitus ([place])   . Diverticulosis   . Dyslipidemia   . ED (erectile dysfunction)   . GERD (gastroesophageal reflux disease)   . H/O degenerative disc disease   . Hemorrhoids   . HTN (hypertension)   . Hyperlipidemia   . LVH (left ventricular hypertrophy)    on EKG  . Mass of lower lobe of left lung   . Mediastinal adenopathy   . Smoker    former  . Wears dentures   . Wears glasses     Past Surgical History:  Procedure Laterality Date  . BRONCHIAL BIOPSY  08/20/2019   Procedure: BRONCHIAL BIOPSIES;  Surgeon: Collene Gobble, MD;  Location: [facility] ENDOSCOPY;  Service: Pulmonary;;  . BRONCHIAL BRUSHINGS  08/20/2019   Procedure: BRONCHIAL BRUSHINGS;  Surgeon: Collene Gobble, MD;  Location: [facility] ENDOSCOPY;  Service: Pulmonary;;  . BRONCHIAL NEEDLE ASPIRATION BIOPSY  08/20/2019   Procedure: BRONCHIAL NEEDLE ASPIRATION BIOPSIES;  Surgeon: Collene Gobble, MD;  Location: [facility] ENDOSCOPY;  Service: Pulmonary;;  . CARPAL TUNNEL RELEASE Right 01/09/2019   Procedure: CARPAL TUNNEL RELEASE;  Surgeon: Leandrew Koyanagi, MD;  Location: [place];  Service: Orthopedics;  Laterality: Right;  . CATARACT EXTRACTION Right 2018  . COLONOSCOPY  2007   Dr. Benson Norway  . I & D EXTREMITY Right 04/02/2016   Procedure: IRRIGATION AND DEBRIDEMENT GREAT TOE;  Surgeon: Leandrew Koyanagi, MD;  Location: [place];  Service: Orthopedics;  Laterality: Right;  . MULTIPLE TOOTH EXTRACTIONS    . RADIOACTIVE SEED IMPLANT  2003  . ULNAR TUNNEL RELEASE Right 01/09/2019   Procedure: RIGHT CUBITAL TUNNEL RELEASE AND CARPAL TUNNEL RELEASE;  Surgeon: Leandrew Koyanagi, MD;  Location: [place];  Service: Orthopedics;  Laterality: Right;  . UPPER GASTROINTESTINAL ENDOSCOPY    . VIDEO BRONCHOSCOPY WITH ENDOBRONCHIAL ULTRASOUND N/A 08/20/2019   Procedure: VIDEO BRONCHOSCOPY WITH ENDOBRONCHIAL ULTRASOUND;  Surgeon: Collene Gobble, MD;  Location: [facility] ENDOSCOPY;  Service: Pulmonary;  Laterality: N/A;    Allergies: Patient has no known allergies.  Medications: Prior to Admission medications   Medication Sig Start Date End Date Taking? Authorizing Provider  amLODipine (NORVASC) 5 MG tablet Take 1 tablet (5 mg total) by mouth daily. Patient taking differently: Take 5 mg by mouth every evening.  10/17/18  Yes [name], MD  aspirin EC 81 MG tablet Take 81 mg by mouth daily after breakfast.    Yes [provider]  Blood Glucose Monitoring Suppl (ONETOUCH VERIO) w/Device KIT USE TO CHECK SUGAR  DAILY 02/21/17  Yes [name], MD  cholecalciferol (VITAMIN D) 1000 units tablet Take 1,000 Units by mouth daily after breakfast.    Yes [provider]  lisinopril-hydrochlorothiazide (ZESTORETIC) 20-12.5 MG tablet Take 1 tablet by mouth daily. Patient taking differently: Take 1 tablet by mouth daily after breakfast.  10/17/18  Yes [name], MD  LORazepam (ATIVAN) 0.5 MG tablet 1 tablet 30 minutes before the MRI.  Repeat once if needed. 08/28/19  Yes Curt Bears, MD  metFORMIN (GLUCOPHAGE-XR) 500 MG 24 hr  tablet TAKE 1 TABLET BY MOUTH EVERY DAY Patient taking differently: Take 500 mg by mouth daily after breakfast.  07/05/19  Yes [name], MD  methylPREDNISolone (MEDROL DOSEPAK) 4 MG TBPK tablet Use as instructed. 08/23/19  Yes Curt Bears, MD  Multiple Vitamin (MULTIVITAMIN WITH MINERALS) TABS tablet Take 1 tablet by mouth daily after breakfast.    Yes [provider]  simvastatin (ZOCOR) 40 MG tablet Take 1 tablet (40 mg total) by mouth daily. Patient taking differently: Take 40 mg by mouth every evening.  10/17/18  Yes [name], MD  Blood Glucose Monitoring Suppl [facility] VERIO) w/Device KIT USE TO CHECK SUGAR DAILY 03/27/18   [name], MD  Lancets (ONETOUCH DELICA PLUS [id number]) [place] PATIENT IS TO TEST TWO TIMES A DAY DX: E11.9 (ONETOUCH VERIO FLEX) 04/23/19   [name], MD  lidocaine-prilocaine (EMLA) cream Apply to the Port-A-Cath site 30-60-minute before chemotherapy. 08/24/19   Curt Bears, MD  [facility] VERIO test strip USE AS INSTRUCTED TO CHECK TWICE DAILY 09/17/18   [name], MD  prochlorperazine (COMPAZINE) 10 MG tablet Take 1 tablet (10 mg total) by mouth every 6 (six) hours as needed for nausea or vomiting. 08/23/19   Curt Bears, MD     Family History  Problem Relation Age of Onset  . Heart failure Mother   . Diabetes Mother   . Stroke Father   . Diabetes Father   . Diabetes Sister   . Diabetes Sister   . Lung cancer Sister 56  . Colon cancer Neg Hx   . Esophageal cancer Neg Hx   . Rectal cancer Neg Hx   . Colon polyps Neg Hx   . Stomach cancer Neg Hx     Social History   Socioeconomic History  . Marital status: Married    Spouse name: Not on file  . Number of children: 3  . Years of education: Not on file  . Highest education level: Not on file  Occupational History  . Occupation: retired  Tobacco Use  . Smoking status: Former Smoker    Packs/day: 1.00    Years: 32.00    Pack years: 32.00    Start date:  02/08/1955    Quit date: 10/09/1987    Years since quitting: 31.9  . Smokeless tobacco: Never Used  Vaping Use  . Vaping Use: Never used  Substance and Sexual Activity  . Alcohol use: No  . Drug use: No  . Sexual activity: Yes  Other Topics Concern  . Not on file  Social History Narrative   Married, no pets   Social Determinants of Health   Financial Resource Strain:   . Difficulty of Paying Living Expenses:   Food Insecurity:   . Worried About Charity fundraiser in the Last Year:   . Arboriculturist in the Last Year:   Transportation Needs:   . Film/video editor (Medical):   Marland Kitchen  Lack of Transportation (Non-Medical):   Physical Activity:   . Days of Exercise per Week:   . Minutes of Exercise per Session:   Stress:   . Feeling of Stress :   Social Connections:   . Frequency of Communication with Friends and Family:   . Frequency of Social Gatherings with Friends and Family:   . Attends Religious Services:   . Active Member of Clubs or Organizations:   . Attends Archivist Meetings:   Marland Kitchen Marital Status:      Review of Systems: A 12 point ROS discussed and pertinent positives are indicated in the HPI above.  All other systems are negative.  Review of Systems  Constitutional: Negative for fatigue and fever.  Respiratory: Negative for cough and shortness of breath.   Cardiovascular: Negative for chest pain.  Gastrointestinal: Negative for abdominal pain.  Genitourinary: Negative for dysuria.  Musculoskeletal: Negative for back pain.  Psychiatric/Behavioral: Negative for behavioral problems and confusion.    Vital Signs: BP (!) 121/64   Pulse 92   Temp 98.3 F (36.8 C) (Oral)   Resp 16   Ht '5\' 7"'  (1.702 m)   Wt 159 lb (72.1 kg)   SpO2 98%   BMI 24.90 kg/m   Physical Exam Vitals and nursing note reviewed.  Constitutional:      General: He is not in acute distress.    Appearance: Normal appearance. He is not ill-appearing.  HENT:      Mouth/Throat:     Mouth: Mucous membranes are moist.     Pharynx: Oropharynx is clear.  Cardiovascular:     Rate and Rhythm: Normal rate and regular rhythm.     Pulses: Normal pulses.  Pulmonary:     Effort: Pulmonary effort is normal. No respiratory distress.     Breath sounds: Normal breath sounds.  Abdominal:     General: Abdomen is flat. Bowel sounds are normal. There is no distension.     Palpations: Abdomen is soft.  Skin:    General: Skin is warm and dry.  Neurological:     General: No focal deficit present.     Mental Status: He is alert and oriented to person, place, and time. Mental status is at baseline.  Psychiatric:        Mood and Affect: Mood normal.        Behavior: Behavior normal.        Thought Content: Thought content normal.        Judgment: Judgment normal.     MD Evaluation Airway: WNL Heart: WNL Abdomen: WNL Chest/ Lungs: WNL ASA  Classification: 3 Mallampati/Airway Score: Two   Imaging: DG Chest 2 View  Result Date: 08/16/2019 CLINICAL DATA:  Shortness of breath, reported esophageal dilation 2 weeks prior EXAM: CHEST - 2 VIEW COMPARISON:  Radiograph 02/04/2015 FINDINGS: Focal masslike opacity in the left perihilar region, could reflect inflection or sequela of aspiration given recent esophageal manipulation however an underlying neoplasm is not excluded given these contours. No pneumothorax or effusion. No convincing features of edema. No acute osseous or soft tissue abnormality. Degenerative changes are present in the imaged spine and shoulders. Telemetry leads overlie the chest. IMPRESSION: Focal masslike opacity in the left perihilar region, could reflect inflection or sequela of aspiration given recent esophageal manipulation however an underlying neoplasm is not excluded given these contours. Recommend further evaluation with contrast enhanced CT. Electronically Signed   By: Lovena Le M.D.   On: 08/16/2019 19:36   CT  Chest W Contrast  Result  Date: 08/16/2019 CLINICAL DATA:  Chest pain and shortness of breath. EXAM: CT CHEST, ABDOMEN, AND PELVIS WITH CONTRAST TECHNIQUE: Multidetector CT imaging of the chest, abdomen and pelvis was performed following the standard protocol during bolus administration of intravenous contrast. CONTRAST:  169m OMNIPAQUE IOHEXOL 300 MG/ML  SOLN COMPARISON:  None. FINDINGS: CT CHEST FINDINGS Cardiovascular: No significant vascular findings. Normal heart size. No pericardial effusion. Mediastinum/Nodes: Necrotic pretracheal subcarinal and AP window lymph nodes are seen (the largest measures approximately 2.3 cm x 1.8 cm). Lungs/Pleura: A 6.7 cm x 4.8 cm heterogeneous low-attenuation lung mass is seen within the anteromedial aspect of the left lower lobe. This is most prominent within the region posterior to the left hilum. Numerous bilateral noncalcified lung nodules of various sizes are seen. There is no evidence of acute infiltrate, pleural effusion or pneumothorax. Musculoskeletal: Multilevel degenerative changes seen throughout the thoracic spine. CT ABDOMEN PELVIS FINDINGS Hepatobiliary: No focal liver abnormality is seen. No gallstones, gallbladder wall thickening, or biliary dilatation. Pancreas: Unremarkable. No pancreatic ductal dilatation or surrounding inflammatory changes. Spleen: Normal in size without focal abnormality. Adrenals/Urinary Tract: Adrenal glands are unremarkable. Kidneys are normal in size, without renal calculi or hydronephrosis. A 1.6 cm simple cyst is seen within the anterior aspect of the mid right kidney. Bladder is unremarkable. Stomach/Bowel: Stomach is within normal limits. The appendix is not clearly identified. No evidence of bowel wall thickening, distention, or inflammatory changes. Noninflamed diverticula are seen throughout the sigmoid colon. Vascular/Lymphatic: There is moderate severity calcification of the abdominal aorta. No enlarged abdominal or pelvic lymph nodes. Reproductive:  Prostate radiation implantation seeds are seen with a moderately enlarged prostate gland. Other: No abdominal wall hernia or abnormality. No abdominopelvic ascites. Musculoskeletal: Multilevel degenerative changes seen throughout the lumbar spine. IMPRESSION: 1. 6.7 cm x 4.8 cm heterogeneous low-attenuation left lower lobe lung mass consistent with primary lung malignancy. 2. Numerous bilateral noncalcified lung nodules of various sizes, consistent with metastatic disease. 3. Necrotic pretracheal, subcarinal and AP window lymph nodes. 4. Prostate radiation implantation seeds with a moderately enlarged prostate gland. 5. Noninflamed sigmoid diverticulosis. 6. Aortic atherosclerosis. Aortic Atherosclerosis (ICD10-I70.0). Electronically Signed   By: TVirgina NorfolkM.D.   On: 08/16/2019 20:33   CT ABDOMEN PELVIS W CONTRAST  Result Date: 08/16/2019 CLINICAL DATA:  Chest pain and shortness of breath. EXAM: CT CHEST, ABDOMEN, AND PELVIS WITH CONTRAST TECHNIQUE: Multidetector CT imaging of the chest, abdomen and pelvis was performed following the standard protocol during bolus administration of intravenous contrast. CONTRAST:  1042mOMNIPAQUE IOHEXOL 300 MG/ML  SOLN COMPARISON:  None. FINDINGS: Cardiovascular: No significant vascular findings. Normal heart size. No pericardial effusion. Mediastinum/Nodes: Necrotic pretracheal subcarinal and AP window lymph nodes are seen (the largest measures approximately 2.3 cm x 1.8 cm). Lungs/Pleura: A 6.7 cm x 4.8 cm heterogeneous low-attenuation lung mass is seen within the anteromedial aspect of the left lower lobe. This is most prominent within the region posterior to the left hilum. Numerous bilateral noncalcified lung nodules of various sizes are seen. There is no evidence of acute infiltrate, pleural effusion or pneumothorax. Musculoskeletal: Multilevel degenerative changes seen throughout the thoracic spine. CT ABDOMEN PELVIS FINDINGS Hepatobiliary: No focal liver  abnormality is seen. No gallstones, gallbladder wall thickening, or biliary dilatation. Pancreas: Unremarkable. No pancreatic ductal dilatation or surrounding inflammatory changes. Spleen: Normal in size without focal abnormality. Adrenals/Urinary Tract: Adrenal glands are unremarkable. Kidneys are normal in size, without renal calculi or hydronephrosis. A 1.6 cm simple  cyst is seen within the anterior aspect of the mid right kidney. Bladder is unremarkable. Stomach/Bowel: Stomach is within normal limits. The appendix is not clearly identified. No evidence of bowel wall thickening, distention, or inflammatory changes. Noninflamed diverticula are seen throughout the sigmoid colon. Vascular/Lymphatic: There is moderate severity calcification of the abdominal aorta. No enlarged abdominal or pelvic lymph nodes. Reproductive: Prostate radiation implantation seeds are seen with a moderately enlarged prostate gland. Other: No abdominal wall hernia or abnormality. No abdominopelvic ascites. Musculoskeletal: Multilevel degenerative changes seen throughout the lumbar spine. IMPRESSION: 1. 6.7 cm x 4.8 cm heterogeneous low-attenuation left lower lobe lung mass consistent with primary lung malignancy. 2. Numerous bilateral noncalcified lung nodules of various sizes, consistent with metastatic disease. 3. Necrotic pretracheal, subcarinal and AP window lymph nodes. 4. Prostate radiation implantation seeds with a moderately enlarged prostate gland. 5. Noninflamed sigmoid diverticulosis. 6. Aortic atherosclerosis. Aortic Atherosclerosis (ICD10-I70.0). Electronically Signed   By: Virgina Norfolk M.D.   On: 08/16/2019 20:33   DG CHEST PORT 1 VIEW  Result Date: 08/20/2019 CLINICAL DATA:  Status post bronchoscopy EXAM: PORTABLE CHEST 1 VIEW COMPARISON:  August 16, 2019 chest radiograph and chest CT FINDINGS: No appreciable pneumothorax. There is again noted a mass overlying the left hilum measuring 5.5 x 4.9 cm. Lungs elsewhere  clear. Heart is upper normal in size with pulmonary vascularity normal. No adenopathy. No bone lesions. IMPRESSION: No pneumothorax. Masslike area overlying the left hilum seen on CT to reside in the superior segment left lower lobe region. Lungs otherwise clear. Stable cardiac silhouette. Electronically Signed   By: Lowella Grip III M.D.   On: 08/20/2019 12:03    Labs:  CBC: Recent Labs    08/14/19 0918 08/16/19 1842 08/16/19 1855 08/20/19 0658 08/23/19 0935  WBC 7.8 7.0  --  7.0 8.2  HGB 9.8* 9.2* 9.5* 9.9* 9.8*  HCT 30.1* 28.7* 28.0* 31.0* 30.7*  PLT 549* 509*  --  497* 516*    COAGS: Recent Labs    08/20/19 0658  INR 1.2  APTT 33    BMP: Recent Labs    01/08/19 1226 08/14/19 0918 08/16/19 1842 08/16/19 1855 08/20/19 0658 08/23/19 0935  NA   < > 132* 136 136 135 131*  K   < > 4.4 4.3 4.3 4.8 4.0  CL   < > 94* 97* 96* 99 95*  CO2  --  24 27  --  23 25  GLUCOSE   < > 97 99 98 104* 158*  BUN   < > '16 20 20 15 21  ' CALCIUM  --  10.0 9.8  --  9.7 9.8  CREATININE   < > 1.29* 1.19 1.20 1.20 1.40*  GFRNONAA  --  52* 57*  --  57* 47*  GFRAA  --  60 >60  --  >60 55*   < > = values in this interval not displayed.    LIVER FUNCTION TESTS: Recent Labs    04/02/19 0955 08/14/19 0918 08/16/19 1842 08/23/19 0935  BILITOT 0.5 0.6 0.9 0.6  AST '13 13 16 ' 14*  ALT '12 8 12 11  ' ALKPHOS 65 64 54 63  PROT 8.2 8.1 8.9* 8.6*  ALBUMIN 4.0 3.7 3.5 3.1*    TUMOR MARKERS: No results for input(s): AFPTM, CEA, CA199, CHROMGRNA in the last 8760 hours.  Assessment and Plan: Patient with past medical history of DM, GERD, HTN presents with complaint of newly diagnosed lung cancer.  IR consulted for Port-A-Cath placement at  the request of Dr. Julien Nordmann. Case reviewed by Dr. Earleen Newport who approves patient for procedure.  Patient presents today in their usual state of health.  He has been NPO and is not currently on blood thinners.   Risks and benefits of image guided  port-a-catheter placement was discussed with the patient including, but not limited to bleeding, infection, pneumothorax, or fibrin sheath development and need for additional procedures.  All of the patient's questions were answered, patient is agreeable to proceed. Consent signed and in chart.  Thank you for this interesting consult.  I greatly enjoyed meeting Shaun Kennedy and look forward to participating in their care.  A copy of this report was sent to the requesting provider on this date.  Electronically Signed: Docia Barrier, PA 08/30/2019, 1:07 PM   I spent a total of  30 Minutes   in face to face in clinical consultation, greater than 50% of which was counseling/coordinating care for lung cancer.

## 2019-08-30 NOTE — Procedures (Signed)
Interventional Radiology Procedure Note  Procedure: Placement of a right IJ approach single lumen PowerPort.  Tip is positioned at the superior cavoatrial junction and catheter is ready for immediate use.  Complications: None Recommendations:  - Ok to shower tomorrow - Do not submerge for 7 days - Routine line care   Signed,  Caraline Deutschman S. Roshelle Traub, DO   

## 2019-08-30 NOTE — Telephone Encounter (Signed)
Scheduled appt per 7/21 sch msg  - unable to reach pt . Left message with appt date and times

## 2019-09-01 NOTE — Progress Notes (Signed)
[facility] OFFICE PROGRESS NOTE  [name], [place] [address]  DIAGNOSIS: stage IV (T3, N2, M1 a) non-small cell lung cancer, squamous cell carcinoma presented with obstructive left lower lobe lung mass in addition to mediastinal lymphadenopathy as well as bilateral pulmonary nodules diagnosed in July 2021.  PDL1: 0%  PRIOR THERAPY: None  CURRENT THERAPY: 1) Palliative radiotherapy to the obstructive left lower lobe lung mass under the care of Dr. Lisbeth Renshaw.  2) 2 cycles of chemotherapy with carboplatin for an AUC of 5 and paclitaxel 175 mg/m2 in addition to immunotherapy with nivolumab 360 mg every 3 weeks and ipilimumab 1 mg/kg IV every 6 weeks followed by maintenance nivolumab and ipilimumab.  First dose expected on 09/04/19  INTERVAL HISTORY: Shaun Kennedy 79 y.o. male returns to the clinic today for a follow up visit. The patient is feeling fair today without any concerning complaints except for decreased appetite. He was given a medrol dose pack at his last appointment without appreciable change in his appetite. He lost an additional 5 lbs since his last appointment. He is scheduled to meet with a member of the nutritionist team while in the infusion room tomorrow.   The patient was recently diagnosed with stage IV non-small cell lung cancer, squamous cell carcinoma.  He met with Dr. Benjie Karvonen from radiation oncology for consideration of palliative radiotherapy to the obstructive left lower lobe lung mass.  He started radiation treatment. The patient was given several options for systemic treatment and decided on 2 cycles of chemotherapy with carboplatin and paclitaxel in addition to immunotherapy with nivolumab (every 3 weeks) and ipilimumab (every 6 weeks).  He is expected to receive his first dose of treatment tomorrow.  In the interval since last treatment.  The patient had a Port-A-Cath placed and he tolerated this procedure well without any  adverse side effects.  He is scheduled to have a staging PET scan on 09/06/2019 as well as his brain MRI on 7/30. I reviewed these appointments with him and his wife and discussed why these imaging studies are necessary.  Today, he denies any fever, chills, or night sweats.  He continues to endorse chest discomfort as well as intermittent shortness of breath and cough a productive cough which produces clear mucus.  He denies any hemoptysis.  He denies any nausea, vomiting, diarrhea, or constipation.  Denies any visual changes. He had a headache yesterday and took two tylenol. He is here today for evaluation before starting his first cycle of treatment.  MEDICAL HISTORY: Past Medical History:  Diagnosis Date  . Allergic rhinitis   . Asthma   . Carotid stenosis   . Colon cancer (Start) 2003  . Diabetes mellitus ([place])   . Diverticulosis   . Dyslipidemia   . ED (erectile dysfunction)   . GERD (gastroesophageal reflux disease)   . H/O degenerative disc disease   . Hemorrhoids   . HTN (hypertension)   . Hyperlipidemia   . LVH (left ventricular hypertrophy)    on EKG  . Mass of lower lobe of left lung   . Mediastinal adenopathy   . Smoker    former  . Wears dentures   . Wears glasses     ALLERGIES:  has No Known Allergies.  MEDICATIONS:  Current Outpatient Medications  Medication Sig Dispense Refill  . amLODipine (NORVASC) 5 MG tablet Take 1 tablet (5 mg total) by mouth daily. (Patient taking differently: Take 5 mg by mouth every  evening. ) 90 tablet 3  . aspirin EC 81 MG tablet Take 81 mg by mouth daily after breakfast.     . Blood Glucose Monitoring Suppl (ONETOUCH VERIO) w/Device KIT USE TO CHECK SUGAR DAILY 1 kit 0  . Blood Glucose Monitoring Suppl (ONETOUCH VERIO) w/Device KIT USE TO CHECK SUGAR DAILY 1 kit 0  . cholecalciferol (VITAMIN D) 1000 units tablet Take 1,000 Units by mouth daily after breakfast.     . Lancets (ONETOUCH DELICA PLUS [id number]) MISC PATIENT IS TO TEST TWO  TIMES A DAY DX: E11.9 (ONETOUCH VERIO FLEX) 100 each 12  . lidocaine-prilocaine (EMLA) cream Apply to the Port-A-Cath site 30-60-minute before chemotherapy. 30 g 0  . lisinopril-hydrochlorothiazide (ZESTORETIC) 20-12.5 MG tablet Take 1 tablet by mouth daily. (Patient taking differently: Take 1 tablet by mouth daily after breakfast. ) 90 tablet 2  . LORazepam (ATIVAN) 0.5 MG tablet 1 tablet 30 minutes before the MRI.  Repeat once if needed. 2 tablet 0  . metFORMIN (GLUCOPHAGE-XR) 500 MG 24 hr tablet TAKE 1 TABLET BY MOUTH EVERY DAY (Patient taking differently: Take 500 mg by mouth daily after breakfast. ) 90 tablet 1  . methylPREDNISolone (MEDROL DOSEPAK) 4 MG TBPK tablet Use as instructed. 21 tablet 0  . Multiple Vitamin (MULTIVITAMIN WITH MINERALS) TABS tablet Take 1 tablet by mouth daily after breakfast.     . ONETOUCH VERIO test strip USE AS INSTRUCTED TO CHECK TWICE DAILY 200 strip 5  . prochlorperazine (COMPAZINE) 10 MG tablet Take 1 tablet (10 mg total) by mouth every 6 (six) hours as needed for nausea or vomiting. 30 tablet 0  . simvastatin (ZOCOR) 40 MG tablet Take 1 tablet (40 mg total) by mouth daily. (Patient taking differently: Take 40 mg by mouth every evening. ) 90 tablet 3  . mirtazapine (REMERON) 15 MG tablet Take 1 tablet (15 mg total) by mouth at bedtime. 30 tablet 2   No current facility-administered medications for this visit.    SURGICAL HISTORY:  Past Surgical History:  Procedure Laterality Date  . BRONCHIAL BIOPSY  08/20/2019   Procedure: BRONCHIAL BIOPSIES;  Surgeon: Collene Gobble, MD;  Location: [facility] ENDOSCOPY;  Service: Pulmonary;;  . BRONCHIAL BRUSHINGS  08/20/2019   Procedure: BRONCHIAL BRUSHINGS;  Surgeon: Collene Gobble, MD;  Location: [facility] ENDOSCOPY;  Service: Pulmonary;;  . BRONCHIAL NEEDLE ASPIRATION BIOPSY  08/20/2019   Procedure: BRONCHIAL NEEDLE ASPIRATION BIOPSIES;  Surgeon: Collene Gobble, MD;  Location: [facility] ENDOSCOPY;  Service: Pulmonary;;  . CARPAL TUNNEL  RELEASE Right 01/09/2019   Procedure: CARPAL TUNNEL RELEASE;  Surgeon: Leandrew Koyanagi, MD;  Location: [facility];  Service: Orthopedics;  Laterality: Right;  . CATARACT EXTRACTION Right 2018  . COLONOSCOPY  2007   Dr. Benson Norway  . I & D EXTREMITY Right 04/02/2016   Procedure: IRRIGATION AND DEBRIDEMENT GREAT TOE;  Surgeon: Leandrew Koyanagi, MD;  Location: [facility];  Service: Orthopedics;  Laterality: Right;  . IR IMAGING GUIDED PORT INSERTION  08/30/2019  . MULTIPLE TOOTH EXTRACTIONS    . RADIOACTIVE SEED IMPLANT  2003  . ULNAR TUNNEL RELEASE Right 01/09/2019   Procedure: RIGHT CUBITAL TUNNEL RELEASE AND CARPAL TUNNEL RELEASE;  Surgeon: Leandrew Koyanagi, MD;  Location: [facility];  Service: Orthopedics;  Laterality: Right;  . UPPER GASTROINTESTINAL ENDOSCOPY    . VIDEO BRONCHOSCOPY WITH ENDOBRONCHIAL ULTRASOUND N/A 08/20/2019   Procedure: VIDEO BRONCHOSCOPY WITH ENDOBRONCHIAL ULTRASOUND;  Surgeon: Collene Gobble, MD;  Location: [facility] ENDOSCOPY;  Service: Pulmonary;  Laterality: N/A;    REVIEW OF SYSTEMS:   Review of Systems  Constitutional: Positive for appetite change and weight loss. Negative for chills and fever. HENT: Negative for mouth sores, nosebleeds, sore throat and trouble swallowing.   Eyes: Negative for eye problems and icterus.  Respiratory: Positive for cough, occasional shortness of breath.  Negative for hemoptysis, and wheezing.   Cardiovascular: Positive for intermittent chest discomfort (none at this time). Negative for leg swelling.  Gastrointestinal: Negative for abdominal pain, constipation, diarrhea, nausea and vomiting.  Genitourinary: Negative for bladder incontinence, difficulty urinating, dysuria, frequency and hematuria.   Musculoskeletal: Negative for back pain, gait problem, neck pain and neck stiffness.  Skin: Negative for itching and rash.  Neurological: Negative for dizziness, extremity weakness, gait problem, headaches (resolved-yesterday),  light-headedness and seizures.  Hematological: Negative for adenopathy. Does not bruise/bleed easily.  Psychiatric/Behavioral: Negative for confusion, depression and sleep disturbance. The patient is not nervous/anxious.     PHYSICAL EXAMINATION:  Blood pressure (!) 106/50, pulse 93, temperature (!) 97.3 F (36.3 C), temperature source Temporal, resp. rate 22, weight 155 lb 12.8 oz (70.7 kg), SpO2 96 %.  ECOG PERFORMANCE STATUS: 1 - Symptomatic but completely ambulatory  Physical Exam  Constitutional: Oriented to person, place, and time and thin appearing male and in no distress.   HENT:  Head: Normocephalic and atraumatic.  Mouth/Throat: Oropharynx is clear and moist. No oropharyngeal exudate.  Eyes: Conjunctivae are normal. Right eye exhibits no discharge. Left eye exhibits no discharge. No scleral icterus.  Neck: Normal range of motion. Neck supple.  Cardiovascular: Normal rate, regular rhythm, normal heart sounds and intact distal pulses.   Pulmonary/Chest: Effort normal and breath sounds normal. No respiratory distress. No wheezes. No rales.  Abdominal: Soft. Bowel sounds are normal. Exhibits no distension and no mass. There is no tenderness.  Musculoskeletal: Normal range of motion. Exhibits no edema.  Lymphadenopathy:    No cervical adenopathy.  Neurological: Alert and oriented to person, place, and time. Exhibits normal muscle tone. Gait normal. Coordination normal.  Skin: Skin is warm and dry. No rash noted. Not diaphoretic. No erythema. No pallor.  Psychiatric: Mood, memory and judgment normal.  Vitals reviewed.  LABORATORY DATA: Lab Results  Component Value Date   WBC 8.3 09/03/2019   HGB 9.2 (L) 09/03/2019   HCT 27.9 (L) 09/03/2019   MCV 86.1 09/03/2019   PLT 471 (H) 09/03/2019      Chemistry      Component Value Date/Time   NA 131 (L) 08/23/2019 0935   NA 132 (L) 08/14/2019 0918   K 4.0 08/23/2019 0935   CL 95 (L) 08/23/2019 0935   CO2 25 08/23/2019 0935    BUN 21 08/23/2019 0935   BUN 16 08/14/2019 0918   CREATININE 1.40 (H) 08/23/2019 0935   CREATININE 1.25 (H) 08/08/2016 0912      Component Value Date/Time   CALCIUM 9.8 08/23/2019 0935   ALKPHOS 63 08/23/2019 0935   AST 14 (L) 08/23/2019 0935   ALT 11 08/23/2019 0935   BILITOT 0.6 08/23/2019 0935       RADIOGRAPHIC STUDIES:  DG Chest 2 View  Result Date: 08/16/2019 CLINICAL DATA:  Shortness of breath, reported esophageal dilation 2 weeks prior EXAM: CHEST - 2 VIEW COMPARISON:  Radiograph 02/04/2015 FINDINGS: Focal masslike opacity in the left perihilar region, could reflect inflection or sequela of aspiration given recent esophageal manipulation however an underlying neoplasm is not excluded given these contours. No pneumothorax or effusion.  No convincing features of edema. No acute osseous or soft tissue abnormality. Degenerative changes are present in the imaged spine and shoulders. Telemetry leads overlie the chest. IMPRESSION: Focal masslike opacity in the left perihilar region, could reflect inflection or sequela of aspiration given recent esophageal manipulation however an underlying neoplasm is not excluded given these contours. Recommend further evaluation with contrast enhanced CT. Electronically Signed   By: Lovena Le M.D.   On: 08/16/2019 19:36   CT Chest W Contrast  Result Date: 08/16/2019 CLINICAL DATA:  Chest pain and shortness of breath. EXAM: CT CHEST, ABDOMEN, AND PELVIS WITH CONTRAST TECHNIQUE: Multidetector CT imaging of the chest, abdomen and pelvis was performed following the standard protocol during bolus administration of intravenous contrast. CONTRAST:  118m OMNIPAQUE IOHEXOL 300 MG/ML  SOLN COMPARISON:  None. FINDINGS: CT CHEST FINDINGS Cardiovascular: No significant vascular findings. Normal heart size. No pericardial effusion. Mediastinum/Nodes: Necrotic pretracheal subcarinal and AP window lymph nodes are seen (the largest measures approximately 2.3 cm x 1.8 cm).  Lungs/Pleura: A 6.7 cm x 4.8 cm heterogeneous low-attenuation lung mass is seen within the anteromedial aspect of the left lower lobe. This is most prominent within the region posterior to the left hilum. Numerous bilateral noncalcified lung nodules of various sizes are seen. There is no evidence of acute infiltrate, pleural effusion or pneumothorax. Musculoskeletal: Multilevel degenerative changes seen throughout the thoracic spine. CT ABDOMEN PELVIS FINDINGS Hepatobiliary: No focal liver abnormality is seen. No gallstones, gallbladder wall thickening, or biliary dilatation. Pancreas: Unremarkable. No pancreatic ductal dilatation or surrounding inflammatory changes. Spleen: Normal in size without focal abnormality. Adrenals/Urinary Tract: Adrenal glands are unremarkable. Kidneys are normal in size, without renal calculi or hydronephrosis. A 1.6 cm simple cyst is seen within the anterior aspect of the mid right kidney. Bladder is unremarkable. Stomach/Bowel: Stomach is within normal limits. The appendix is not clearly identified. No evidence of bowel wall thickening, distention, or inflammatory changes. Noninflamed diverticula are seen throughout the sigmoid colon. Vascular/Lymphatic: There is moderate severity calcification of the abdominal aorta. No enlarged abdominal or pelvic lymph nodes. Reproductive: Prostate radiation implantation seeds are seen with a moderately enlarged prostate gland. Other: No abdominal wall hernia or abnormality. No abdominopelvic ascites. Musculoskeletal: Multilevel degenerative changes seen throughout the lumbar spine. IMPRESSION: 1. 6.7 cm x 4.8 cm heterogeneous low-attenuation left lower lobe lung mass consistent with primary lung malignancy. 2. Numerous bilateral noncalcified lung nodules of various sizes, consistent with metastatic disease. 3. Necrotic pretracheal, subcarinal and AP window lymph nodes. 4. Prostate radiation implantation seeds with a moderately enlarged prostate  gland. 5. Noninflamed sigmoid diverticulosis. 6. Aortic atherosclerosis. Aortic Atherosclerosis (ICD10-I70.0). Electronically Signed   By: TVirgina NorfolkM.D.   On: 08/16/2019 20:33   CT ABDOMEN PELVIS W CONTRAST  Result Date: 08/16/2019 CLINICAL DATA:  Chest pain and shortness of breath. EXAM: CT CHEST, ABDOMEN, AND PELVIS WITH CONTRAST TECHNIQUE: Multidetector CT imaging of the chest, abdomen and pelvis was performed following the standard protocol during bolus administration of intravenous contrast. CONTRAST:  1070mOMNIPAQUE IOHEXOL 300 MG/ML  SOLN COMPARISON:  None. FINDINGS: Cardiovascular: No significant vascular findings. Normal heart size. No pericardial effusion. Mediastinum/Nodes: Necrotic pretracheal subcarinal and AP window lymph nodes are seen (the largest measures approximately 2.3 cm x 1.8 cm). Lungs/Pleura: A 6.7 cm x 4.8 cm heterogeneous low-attenuation lung mass is seen within the anteromedial aspect of the left lower lobe. This is most prominent within the region posterior to the left hilum. Numerous bilateral noncalcified lung nodules of  various sizes are seen. There is no evidence of acute infiltrate, pleural effusion or pneumothorax. Musculoskeletal: Multilevel degenerative changes seen throughout the thoracic spine. CT ABDOMEN PELVIS FINDINGS Hepatobiliary: No focal liver abnormality is seen. No gallstones, gallbladder wall thickening, or biliary dilatation. Pancreas: Unremarkable. No pancreatic ductal dilatation or surrounding inflammatory changes. Spleen: Normal in size without focal abnormality. Adrenals/Urinary Tract: Adrenal glands are unremarkable. Kidneys are normal in size, without renal calculi or hydronephrosis. A 1.6 cm simple cyst is seen within the anterior aspect of the mid right kidney. Bladder is unremarkable. Stomach/Bowel: Stomach is within normal limits. The appendix is not clearly identified. No evidence of bowel wall thickening, distention, or inflammatory changes.  Noninflamed diverticula are seen throughout the sigmoid colon. Vascular/Lymphatic: There is moderate severity calcification of the abdominal aorta. No enlarged abdominal or pelvic lymph nodes. Reproductive: Prostate radiation implantation seeds are seen with a moderately enlarged prostate gland. Other: No abdominal wall hernia or abnormality. No abdominopelvic ascites. Musculoskeletal: Multilevel degenerative changes seen throughout the lumbar spine. IMPRESSION: 1. 6.7 cm x 4.8 cm heterogeneous low-attenuation left lower lobe lung mass consistent with primary lung malignancy. 2. Numerous bilateral noncalcified lung nodules of various sizes, consistent with metastatic disease. 3. Necrotic pretracheal, subcarinal and AP window lymph nodes. 4. Prostate radiation implantation seeds with a moderately enlarged prostate gland. 5. Noninflamed sigmoid diverticulosis. 6. Aortic atherosclerosis. Aortic Atherosclerosis (ICD10-I70.0). Electronically Signed   By: Virgina Norfolk M.D.   On: 08/16/2019 20:33   DG CHEST PORT 1 VIEW  Result Date: 08/20/2019 CLINICAL DATA:  Status post bronchoscopy EXAM: PORTABLE CHEST 1 VIEW COMPARISON:  August 16, 2019 chest radiograph and chest CT FINDINGS: No appreciable pneumothorax. There is again noted a mass overlying the left hilum measuring 5.5 x 4.9 cm. Lungs elsewhere clear. Heart is upper normal in size with pulmonary vascularity normal. No adenopathy. No bone lesions. IMPRESSION: No pneumothorax. Masslike area overlying the left hilum seen on CT to reside in the superior segment left lower lobe region. Lungs otherwise clear. Stable cardiac silhouette. Electronically Signed   By: Lowella Grip III M.D.   On: 08/20/2019 12:03   IR IMAGING GUIDED PORT INSERTION  Result Date: 08/30/2019 INDICATION: 79 year old male referred for port catheter placement EXAM: IMAGE GUIDED PORT CATHETER PLACEMENT MEDICATIONS: 2 g Ancef; The antibiotic was administered within an appropriate time  interval prior to skin puncture. ANESTHESIA/SEDATION: Moderate (conscious) sedation was employed during this procedure. A total of Versed 1.5 mg and Fentanyl 75 mcg was administered intravenously. Moderate Sedation Time: 14 minutes. The patient's level of consciousness and vital signs were monitored continuously by radiology nursing throughout the procedure under my direct supervision. FLUOROSCOPY TIME:  Fluoroscopy Time: 0 minutes 12 seconds (2 mGy). COMPLICATIONS: None PROCEDURE: The procedure, risks, benefits, and alternatives were explained to the patient. Questions regarding the procedure were encouraged and answered. The patient understands and consents to the procedure. Ultrasound survey was performed with images stored and sent to PACs. The right neck and chest was prepped with chlorhexidine, and draped in the usual sterile fashion using maximum barrier technique (cap and mask, sterile gown, sterile gloves, large sterile sheet, hand hygiene and cutaneous antiseptic). Antibiotic prophylaxis was provided with 2.0g Ancef administered IV one hour prior to skin incision. Local anesthesia was attained by infiltration with 1% lidocaine without epinephrine. Ultrasound demonstrated patency of the right internal jugular vein, and this was documented with an image. Under real-time ultrasound guidance, this vein was accessed with a 21 gauge micropuncture needle and image documentation  was performed. A small dermatotomy was made at the access site with an 11 scalpel. A 0.018" wire was advanced into the SVC and used to estimate the length of the internal catheter. The access needle exchanged for a 72F micropuncture vascular sheath. The 0.018" wire was then removed and a 0.035" wire advanced into the IVC. An appropriate location for the subcutaneous reservoir was selected below the clavicle and an incision was made through the skin and underlying soft tissues. The subcutaneous tissues were then dissected using a combination  of blunt and sharp surgical technique and a pocket was formed. A single lumen power injectable portacatheter was then tunneled through the subcutaneous tissues from the pocket to the dermatotomy and the port reservoir placed within the subcutaneous pocket. The venous access site was then serially dilated and a peel away vascular sheath placed over the wire. The wire was removed and the port catheter advanced into position under fluoroscopic guidance. The catheter tip is positioned in the cavoatrial junction. This was documented with a spot image. The portacatheter was then tested and found to flush and aspirate well. The port was flushed with saline followed by 100 units/mL heparinized saline. The pocket was then closed in two layers using first subdermal inverted interrupted absorbable sutures followed by a running subcuticular suture. The epidermis was then sealed with Dermabond. The dermatotomy at the venous access site was also seal with Dermabond. Patient tolerated the procedure well and remained hemodynamically stable throughout. No complications encountered and no significant blood loss encountered IMPRESSION: Status post right IJ port catheter placement. Signed, Dulcy Fanny. Dellia Nims, RPVI [facility] Radiology Electronically Signed   By: Corrie Mckusick D.O.   On: 08/30/2019 14:49     ASSESSMENT/PLAN:  This is a very pleasant 79 year old [name] recently diagnosed with stage IV (T3, N2, M1a) non-small cell lung cancer, squamous cell carcinoma.  He presented with a left lower lobe obstructive lung mass in addition to mediastinal lymphadenopathy.  He also presented with bilateral pulmonary nodules.  He was diagnosed in July 2021.  His PD-L1 expression is negative.  He is currently undergoing palliative radiotherapy to the obstructive lung mass under the care of Dr. Lisbeth Renshaw.  The patient is currently undergoing systemic treatment with 2 cycles  of chemotherapy with carboplatin for an AUC of 5 and paclitaxel 175 mg/m2 in addition to immunotherapy with nivolumab 360 mg every 3 weeks and ipilimumab 1 mg/kg IV every 6 weeks.  He is expected to receive his first dose of treatment tomorrow.  The patient was seen with Dr. Julien Nordmann today.  Labs were reviewed.  Recommend he proceed with cycle #1 tomorrow as scheduled. He is scheduled to have his chemo education class today.  We will see him back for a follow-up visit in 1 week for evaluation and a 1 week follow up visit. We will also review the results of the PET scan and MRI at that appointment. Discussed the importance of going to those appointments as scheduled. Reviewed the patient's calendar with him and his wife.    Regarding his weight loss, the patient is scheduled to meet with a member of the nutritionist team while in the infusion room tomorrow. We will also start the patient on remeron 15 mg p.o. nightly for his appetite.    The patient was advised to call immediately if he has any concerning symptoms in the interval. The patient voices understanding of current disease status and treatment options and is in agreement  with the current care plan. All questions were answered. The patient knows to call the clinic with any problems, questions or concerns. We can certainly see the patient much sooner if necessary  No orders of the defined types were placed in this encounter.    Jilliam Bellmore L Tru Rana, PA-C 09/03/19  ADDENDUM: Hematology/Oncology Attending: I had a face-to-face encounter with the patient today.  I recommended his care plan.  This is a very pleasant 79 years old [name] recently diagnosed with a stage IV non-small cell lung cancer, squamous cell carcinoma presented with left lower lobe obstructive lung mass in addition to mediastinal lymphadenopathy and bilateral pulmonary nodules diagnosed in July 2021.  His PD-L1 expression is negative. The patient has  staging PET scan and MRI of the brain scheduled for next week. He is here today for evaluation before starting the first cycle of his treatment with immunotherapy with ipilimumab 1 mg/KG every 6 weeks, nivolumab 360 mg IV every 3 weeks in addition to initial 2 cycles of systemic chemotherapy with carboplatin and paclitaxel every 3 weeks. I discussed with the patient the adverse effect of this treatment again including but not limited to alopecia, myelosuppression, nausea and vomiting, peripheral neuropathy, liver or renal dysfunction as well as immunotherapy adverse effects. The patient would like to proceed with the treatment today as planned. For the lack of appetite and depression, we will start the patient on Remeron 15 mg p.o. nightly. We will see him back for follow-up visit in 1 week for evaluation and discussion of his scan results as well as management of any adverse effect of his treatment. The patient was advised to call immediately if he has any other concerning symptoms in the interval.  Disclaimer: This note was dictated with voice recognition software. Similar sounding words can inadvertently be transcribed and may be missed upon review. Eilleen Kempf, MD 09/03/19

## 2019-09-02 ENCOUNTER — Ambulatory Visit
Admission: RE | Admit: 2019-09-02 | Discharge: 2019-09-02 | Disposition: A | Discharge status: Home / Self Care | Payer: Medicare HMO | Source: Ambulatory Visit | Attending: Radiation Oncology | Admitting: Radiation Oncology

## 2019-09-02 ENCOUNTER — Other Ambulatory Visit: Payer: Self-pay

## 2019-09-02 DIAGNOSIS — C3432 Malignant neoplasm of lower lobe, left bronchus or lung: Secondary | ICD-10-CM | POA: Diagnosis not present

## 2019-09-02 DIAGNOSIS — Z510 Encounter for antineoplastic radiation therapy: Secondary | ICD-10-CM | POA: Diagnosis not present

## 2019-09-03 ENCOUNTER — Inpatient Hospital Stay: Payer: Medicare HMO

## 2019-09-03 ENCOUNTER — Other Ambulatory Visit: Payer: Self-pay

## 2019-09-03 ENCOUNTER — Encounter: Payer: Self-pay | Admitting: Physician Assistant

## 2019-09-03 ENCOUNTER — Inpatient Hospital Stay: Payer: Medicare HMO | Admitting: Physician Assistant

## 2019-09-03 ENCOUNTER — Ambulatory Visit
Admission: RE | Admit: 2019-09-03 | Discharge: 2019-09-03 | Disposition: A | Discharge status: Home / Self Care | Payer: Medicare HMO | Source: Ambulatory Visit | Attending: Radiation Oncology | Admitting: Radiation Oncology

## 2019-09-03 VITALS — BP 106/50 | HR 93 | Temp 97.3°F | Resp 22 | Wt 155.8 lb

## 2019-09-03 DIAGNOSIS — Z5111 Encounter for antineoplastic chemotherapy: Secondary | ICD-10-CM

## 2019-09-03 DIAGNOSIS — Z7189 Other specified counseling: Secondary | ICD-10-CM | POA: Diagnosis not present

## 2019-09-03 DIAGNOSIS — Z5112 Encounter for antineoplastic immunotherapy: Secondary | ICD-10-CM | POA: Diagnosis not present

## 2019-09-03 DIAGNOSIS — R634 Abnormal weight loss: Secondary | ICD-10-CM | POA: Diagnosis not present

## 2019-09-03 DIAGNOSIS — C3432 Malignant neoplasm of lower lobe, left bronchus or lung: Secondary | ICD-10-CM

## 2019-09-03 DIAGNOSIS — Z95828 Presence of other vascular implants and grafts: Secondary | ICD-10-CM

## 2019-09-03 DIAGNOSIS — R630 Anorexia: Secondary | ICD-10-CM | POA: Diagnosis not present

## 2019-09-03 DIAGNOSIS — Z510 Encounter for antineoplastic radiation therapy: Secondary | ICD-10-CM | POA: Diagnosis not present

## 2019-09-03 LAB — CBC WITH DIFFERENTIAL (CANCER CENTER ONLY)
Abs Immature Granulocytes: 0.02 10*3/uL (ref 0.00–0.07)
Basophils Absolute: 0 10*3/uL (ref 0.0–0.1)
Basophils Relative: 0 %
Eosinophils Absolute: 0.2 10*3/uL (ref 0.0–0.5)
Eosinophils Relative: 2 %
HCT: 27.9 % — ABNORMAL LOW (ref 39.0–52.0)
Hemoglobin: 9.2 g/dL — ABNORMAL LOW (ref 13.0–17.0)
Immature Granulocytes: 0 %
Lymphocytes Relative: 20 %
Lymphs Abs: 1.7 10*3/uL (ref 0.7–4.0)
MCH: 28.4 pg (ref 26.0–34.0)
MCHC: 33 g/dL (ref 30.0–36.0)
MCV: 86.1 fL (ref 80.0–100.0)
Monocytes Absolute: 0.6 10*3/uL (ref 0.1–1.0)
Monocytes Relative: 8 %
Neutro Abs: 5.8 10*3/uL (ref 1.7–7.7)
Neutrophils Relative %: 70 %
Platelet Count: 471 10*3/uL — ABNORMAL HIGH (ref 150–400)
RBC: 3.24 MIL/uL — ABNORMAL LOW (ref 4.22–5.81)
RDW: 12.2 % (ref 11.5–15.5)
WBC Count: 8.3 10*3/uL (ref 4.0–10.5)
nRBC: 0 % (ref 0.0–0.2)

## 2019-09-03 LAB — TSH: TSH: 1.408 u[IU]/mL (ref 0.320–4.118)

## 2019-09-03 LAB — CMP (CANCER CENTER ONLY)
ALT: 15 U/L (ref 0–44)
AST: 16 U/L (ref 15–41)
Albumin: 3.1 g/dL — ABNORMAL LOW (ref 3.5–5.0)
Alkaline Phosphatase: 61 U/L (ref 38–126)
Anion gap: 10 (ref 5–15)
BUN: 17 mg/dL (ref 8–23)
CO2: 24 mmol/L (ref 22–32)
Calcium: 10.3 mg/dL (ref 8.9–10.3)
Chloride: 100 mmol/L (ref 98–111)
Creatinine: 1 mg/dL (ref 0.61–1.24)
GFR, Est AFR Am: >60 mL/min (ref >60)
GFR, Estimated: >60 mL/min (ref >60)
Glucose, Bld: 98 mg/dL (ref 70–99)
Potassium: 4.2 mmol/L (ref 3.5–5.1)
Sodium: 134 mmol/L — ABNORMAL LOW (ref 135–145)
Total Bilirubin: 0.6 mg/dL (ref 0.3–1.2)
Total Protein: 8.3 g/dL — ABNORMAL HIGH (ref 6.5–8.1)

## 2019-09-03 MED ORDER — HEPARIN SOD (PORK) LOCK FLUSH 100 UNIT/ML IV SOLN
500.0000 [IU] | Freq: Once | INTRAVENOUS | Status: AC
  Administered 2019-09-03: 500 [IU] via INTRAVENOUS
  Filled 2019-09-03: qty 5

## 2019-09-03 MED ORDER — MIRTAZAPINE 15 MG PO TABS
15.0000 mg | ORAL_TABLET | Freq: Every day | ORAL | 2 refills | Status: DC
Start: 2019-09-03 — End: 2019-09-27

## 2019-09-03 MED ORDER — SODIUM CHLORIDE 0.9% FLUSH
10.0000 mL | INTRAVENOUS | Status: DC | PRN
  Administered 2019-09-03: 10 mL via INTRAVENOUS
  Filled 2019-09-03: qty 10

## 2019-09-04 ENCOUNTER — Inpatient Hospital Stay: Payer: Medicare HMO

## 2019-09-04 ENCOUNTER — Encounter: Payer: Self-pay | Admitting: Internal Medicine

## 2019-09-04 ENCOUNTER — Ambulatory Visit
Admission: RE | Admit: 2019-09-04 | Discharge: 2019-09-04 | Disposition: A | Discharge status: Home / Self Care | Payer: Medicare HMO | Source: Ambulatory Visit | Attending: Radiation Oncology | Admitting: Radiation Oncology

## 2019-09-04 ENCOUNTER — Other Ambulatory Visit: Payer: Self-pay

## 2019-09-04 VITALS — BP 137/65 | HR 77 | Temp 98.3°F | Resp 16 | Ht 67.5 in | Wt 155.0 lb

## 2019-09-04 DIAGNOSIS — C3432 Malignant neoplasm of lower lobe, left bronchus or lung: Secondary | ICD-10-CM

## 2019-09-04 DIAGNOSIS — Z5112 Encounter for antineoplastic immunotherapy: Secondary | ICD-10-CM | POA: Diagnosis not present

## 2019-09-04 DIAGNOSIS — Z510 Encounter for antineoplastic radiation therapy: Secondary | ICD-10-CM | POA: Diagnosis not present

## 2019-09-04 MED ORDER — SODIUM CHLORIDE 0.9 % IV SOLN
1.0000 mg/kg | Freq: Once | INTRAVENOUS | Status: AC
  Administered 2019-09-04: 70 mg via INTRAVENOUS
  Filled 2019-09-04: qty 14

## 2019-09-04 MED ORDER — DIPHENHYDRAMINE HCL 50 MG/ML IJ SOLN
50.0000 mg | Freq: Once | INTRAMUSCULAR | Status: AC
  Administered 2019-09-04: 50 mg via INTRAVENOUS

## 2019-09-04 MED ORDER — SODIUM CHLORIDE 0.9 % IV SOLN
10.0000 mg | Freq: Once | INTRAVENOUS | Status: AC
  Administered 2019-09-04: 10 mg via INTRAVENOUS
  Filled 2019-09-04: qty 10

## 2019-09-04 MED ORDER — SODIUM CHLORIDE 0.9 % IV SOLN
360.0000 mg | Freq: Once | INTRAVENOUS | Status: AC
  Administered 2019-09-04: 360 mg via INTRAVENOUS
  Filled 2019-09-04: qty 24

## 2019-09-04 MED ORDER — SODIUM CHLORIDE 0.9 % IV SOLN
420.0000 mg | Freq: Once | INTRAVENOUS | Status: AC
  Administered 2019-09-04: 420 mg via INTRAVENOUS
  Filled 2019-09-04: qty 42

## 2019-09-04 MED ORDER — SODIUM CHLORIDE 0.9% FLUSH
10.0000 mL | INTRAVENOUS | Status: DC | PRN
  Administered 2019-09-04: 10 mL
  Filled 2019-09-04: qty 10

## 2019-09-04 MED ORDER — FAMOTIDINE IN NACL 20-0.9 MG/50ML-% IV SOLN
INTRAVENOUS | Status: AC
  Filled 2019-09-04: qty 50

## 2019-09-04 MED ORDER — FAMOTIDINE IN NACL 20-0.9 MG/50ML-% IV SOLN
20.0000 mg | Freq: Once | INTRAVENOUS | Status: AC
  Administered 2019-09-04: 20 mg via INTRAVENOUS

## 2019-09-04 MED ORDER — HEPARIN SOD (PORK) LOCK FLUSH 100 UNIT/ML IV SOLN
500.0000 [IU] | Freq: Once | INTRAVENOUS | Status: AC | PRN
  Administered 2019-09-04: 500 [IU]
  Filled 2019-09-04: qty 5

## 2019-09-04 MED ORDER — PALONOSETRON HCL INJECTION 0.25 MG/5ML
INTRAVENOUS | Status: AC
  Filled 2019-09-04: qty 5

## 2019-09-04 MED ORDER — SODIUM CHLORIDE 0.9 % IV SOLN
150.0000 mg | Freq: Once | INTRAVENOUS | Status: AC
  Administered 2019-09-04: 150 mg via INTRAVENOUS
  Filled 2019-09-04: qty 150

## 2019-09-04 MED ORDER — ACETAMINOPHEN 325 MG PO TABS
ORAL_TABLET | ORAL | Status: AC
  Filled 2019-09-04: qty 1

## 2019-09-04 MED ORDER — SODIUM CHLORIDE 0.9 % IV SOLN
Freq: Once | INTRAVENOUS | Status: AC
  Filled 2019-09-04: qty 250

## 2019-09-04 MED ORDER — PALONOSETRON HCL INJECTION 0.25 MG/5ML
0.2500 mg | Freq: Once | INTRAVENOUS | Status: AC
  Administered 2019-09-04: 0.25 mg via INTRAVENOUS

## 2019-09-04 MED ORDER — DIPHENHYDRAMINE HCL 50 MG/ML IJ SOLN
INTRAMUSCULAR | Status: AC
  Filled 2019-09-04: qty 1

## 2019-09-04 MED ORDER — ACETAMINOPHEN 325 MG PO TABS
650.0000 mg | ORAL_TABLET | Freq: Once | ORAL | Status: AC
  Administered 2019-09-04: 650 mg via ORAL

## 2019-09-04 MED ORDER — SODIUM CHLORIDE 0.9 % IV SOLN
175.0000 mg/m2 | Freq: Once | INTRAVENOUS | Status: AC
  Administered 2019-09-04: 318 mg via INTRAVENOUS
  Filled 2019-09-04: qty 53

## 2019-09-04 NOTE — Progress Notes (Signed)
SCr decreased. Change carboplatin dose to 420 mg based on this morning's SCr per Dr. Julien Nordmann.

## 2019-09-04 NOTE — Progress Notes (Signed)
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  Unfortunately there aren't any foundations offering copay assistance for his Dx and the type of ins he has.  I informed him of the [employer], went over what it covers, gave him the income requirement and an expense sheet.  He would like to discuss it with his wife so I gave him my card if he would like to apply for the grant and for any questions or concerns he may have in the future.

## 2019-09-04 NOTE — Patient Instructions (Signed)
[facility] Discharge Instructions for Patients Receiving Chemotherapy  Today you received the following chemotherapy agents: Ipilimumab, nivolumab, paclitaxel, and carboplatin  To help prevent nausea and vomiting after your treatment, we encourage you to take your nausea medication as prescribed.    If you develop nausea and vomiting that is not controlled by your nausea medication, call the clinic.   BELOW ARE SYMPTOMS THAT SHOULD BE REPORTED IMMEDIATELY:  *FEVER GREATER THAN 100.5 F  *CHILLS WITH OR WITHOUT FEVER  NAUSEA AND VOMITING THAT IS NOT CONTROLLED WITH YOUR NAUSEA MEDICATION  *UNUSUAL SHORTNESS OF BREATH  *UNUSUAL BRUISING OR BLEEDING  TENDERNESS IN MOUTH AND THROAT WITH OR WITHOUT PRESENCE OF ULCERS  *URINARY PROBLEMS  *BOWEL PROBLEMS  UNUSUAL RASH Items with * indicate a potential emergency and should be followed up as soon as possible.  Feel free to call the clinic should you have any questions or concerns. The clinic phone number is [phone].  Please show the [facility] at check-in to the Emergency Department and triage nurse.  Ipilimumab injection What is this medicine? IPILIMUMAB (IP i LIM ue mab) is a monoclonal antibody. It is used to treat colorectal cancer, kidney cancer, liver cancer, lung cancer, melanoma, and mesothelioma. This medicine may be used for other purposes; ask your health care provider or pharmacist if you have questions. COMMON BRAND NAME(S): YERVOY What should I tell my health care provider before I take this medicine? They need to know if you have any of these conditions:  Addison's disease  blood in your stools (black or tarry stools) or if you have blood in your vomit  eye disease, vision problems  history of pancreatitis  history of stomach bleeding  immune system problems  inflammatory bowel disease  kidney disease  liver disease  lupus  myasthenia gravis  organ  transplant  rheumatoid arthritis  sarcoidosis  stomach or intestine problems  thyroid disease  tingling of the fingers or toes, or other nerve disorder  an unusual or allergic reaction to ipilimumab, other medicines, foods, dyes, or preservatives  pregnant or trying to get pregnant  breast-feeding How should I use this medicine? This medicine is for infusion into a vein. It is given by a health care professional in a hospital or clinic setting. A special MedGuide will be given to you before each treatment. Be sure to read this information carefully each time. Talk to your pediatrician regarding the use of this medicine in children. While this drug may be prescribed for children as young as 12 years for selected conditions, precautions do apply. Overdosage: If you think you have taken too much of this medicine contact a poison control center or emergency room at once. NOTE: This medicine is only for you. Do not share this medicine with others. What if I miss a dose? It is important not to miss your dose. Call your doctor or health care professional if you are unable to keep an appointment. What may interact with this medicine? Interactions are not expected. This list may not describe all possible interactions. Give your health care provider a list of all the medicines, herbs, non-prescription drugs, or dietary supplements you use. Also tell them if you smoke, drink alcohol, or use illegal drugs. Some items may interact with your medicine. What should I watch for while using this medicine? Tell your doctor or healthcare professional if your symptoms do not start to get better or if they get worse. Do not become pregnant while taking  this medicine or for 3 months after stopping it. Women should inform their doctor if they wish to become pregnant or think they might be pregnant. There is a potential for serious side effects to an unborn child. Talk to your health care professional or  pharmacist for more information. Do not breast-feed an infant while taking this medicine or for 3 months after the last dose. Your condition will be monitored carefully while you are receiving this medicine. You may need blood work done while you are taking this medicine. What side effects may I notice from receiving this medicine? Side effects that you should report to your doctor or health care professional as soon as possible:  allergic reactions like skin rash, itching or hives, swelling of the face, lips, or tongue  black, tarry stools  bloody or watery diarrhea  changes in vision  dizziness  eye pain  fast, irregular heartbeat  feeling anxious  feeling faint or lightheaded, falls  nausea, vomiting  pain, tingling, numbness in the hands or feet  redness, blistering, peeling or loosening of the skin, including inside the mouth  signs and symptoms of liver injury like dark yellow or brown urine; general ill feeling or flu-like symptoms; light-colored stools; loss of appetite; nausea; right upper belly pain; unusually weak or tired; yellowing of the eyes or skin  unusual bleeding or bruising Side effects that usually do not require medical attention (report to your doctor or health care professional if they continue or are bothersome):  headache  loss of appetite  trouble sleeping This list may not describe all possible side effects. Call your doctor for medical advice about side effects. You may report side effects to FDA at 1-800-FDA-1088. Where should I keep my medicine? This drug is given in a hospital or clinic and will not be stored at home. NOTE: This sheet is a summary. It may not cover all possible information. If you have questions about this medicine, talk to your doctor, pharmacist, or health care provider.  2020 Elsevier/Gold Standard (2018-11-13 10:56:55)  Nivolumab injection What is this medicine? NIVOLUMAB (nye VOL ue mab) is a monoclonal antibody.  It is used to treat colon cancer, esophageal cancer, head and neck cancer, Hodgkin lymphoma, kidney cancer, liver cancer, lung cancer, mesothelioma, melanoma, and urothelial cancer. This medicine may be used for other purposes; ask your health care provider or pharmacist if you have questions. COMMON BRAND NAME(S): Opdivo What should I tell my health care provider before I take this medicine? They need to know if you have any of these conditions:  diabetes  immune system problems  kidney disease  liver disease  lung disease  organ transplant  stomach or intestine problems  thyroid disease  an unusual or allergic reaction to nivolumab, other medicines, foods, dyes, or preservatives  pregnant or trying to get pregnant  breast-feeding How should I use this medicine? This medicine is for infusion into a vein. It is given by a health care professional in a hospital or clinic setting. A special MedGuide will be given to you before each treatment. Be sure to read this information carefully each time. Talk to your pediatrician regarding the use of this medicine in children. While this drug may be prescribed for children as young as 12 years for selected conditions, precautions do apply. Overdosage: If you think you have taken too much of this medicine contact a poison control center or emergency room at once. NOTE: This medicine is only for you. Do not share  this medicine with others. What if I miss a dose? It is important not to miss your dose. Call your doctor or health care professional if you are unable to keep an appointment. What may interact with this medicine? Interactions have not been studied. Give your health care provider a list of all the medicines, herbs, non-prescription drugs, or dietary supplements you use. Also tell them if you smoke, drink alcohol, or use illegal drugs. Some items may interact with your medicine. This list may not describe all possible interactions.  Give your health care provider a list of all the medicines, herbs, non-prescription drugs, or dietary supplements you use. Also tell them if you smoke, drink alcohol, or use illegal drugs. Some items may interact with your medicine. What should I watch for while using this medicine? This drug may make you feel generally unwell. Continue your course of treatment even though you feel ill unless your doctor tells you to stop. You may need blood work done while you are taking this medicine. Do not become pregnant while taking this medicine or for 5 months after stopping it. Women should inform their doctor if they wish to become pregnant or think they might be pregnant. There is a potential for serious side effects to an unborn child. Talk to your health care professional or pharmacist for more information. Do not breast-feed an infant while taking this medicine or for 5 months after stopping it. What side effects may I notice from receiving this medicine? Side effects that you should report to your doctor or health care professional as soon as possible:  allergic reactions like skin rash, itching or hives, swelling of the face, lips, or tongue  breathing problems  blood in the urine  bloody or watery diarrhea or black, tarry stools  changes in emotions or moods  changes in vision  chest pain  cough  dizziness  feeling faint or lightheaded, falls  fever, chills  headache with fever, neck stiffness, confusion, loss of memory, sensitivity to light, hallucination, loss of contact with reality, or seizures  joint pain  mouth sores  redness, blistering, peeling or loosening of the skin, including inside the mouth  severe muscle pain or weakness  signs and symptoms of high blood sugar such as dizziness; dry mouth; dry skin; fruity breath; nausea; stomach pain; increased hunger or thirst; increased urination  signs and symptoms of kidney injury like trouble passing urine or change in  the amount of urine  signs and symptoms of liver injury like dark yellow or brown urine; general ill feeling or flu-like symptoms; light-colored stools; loss of appetite; nausea; right upper belly pain; unusually weak or tired; yellowing of the eyes or skin  swelling of the ankles, feet, hands  trouble passing urine or change in the amount of urine  unusually weak or tired  weight gain or loss Side effects that usually do not require medical attention (report to your doctor or health care professional if they continue or are bothersome):  bone pain  constipation  decreased appetite  diarrhea  muscle pain  nausea, vomiting  tiredness This list may not describe all possible side effects. Call your doctor for medical advice about side effects. You may report side effects to FDA at 1-800-FDA-1088. Where should I keep my medicine? This drug is given in a hospital or clinic and will not be stored at home. NOTE: This sheet is a summary. It may not cover all possible information. If you have questions about this medicine,  talk to your doctor, pharmacist, or health care provider.  2020 Elsevier/Gold Standard (2018-11-13 10:04:50)  Paclitaxel injection What is this medicine? PACLITAXEL (PAK li TAX el) is a chemotherapy drug. It targets fast dividing cells, like cancer cells, and causes these cells to die. This medicine is used to treat ovarian cancer, breast cancer, lung cancer, Kaposi's sarcoma, and other cancers. This medicine may be used for other purposes; ask your health care provider or pharmacist if you have questions. COMMON BRAND NAME(S): Onxol, Taxol What should I tell my health care provider before I take this medicine? They need to know if you have any of these conditions:  history of irregular heartbeat  liver disease  low blood counts, like low white cell, platelet, or red cell counts  lung or breathing disease, like asthma  tingling of the fingers or toes, or  other nerve disorder  an unusual or allergic reaction to paclitaxel, alcohol, polyoxyethylated castor oil, other chemotherapy, other medicines, foods, dyes, or preservatives  pregnant or trying to get pregnant  breast-feeding How should I use this medicine? This drug is given as an infusion into a vein. It is administered in a hospital or clinic by a specially trained health care professional. Talk to your pediatrician regarding the use of this medicine in children. Special care may be needed. Overdosage: If you think you have taken too much of this medicine contact a poison control center or emergency room at once. NOTE: This medicine is only for you. Do not share this medicine with others. What if I miss a dose? It is important not to miss your dose. Call your doctor or health care professional if you are unable to keep an appointment. What may interact with this medicine? Do not take this medicine with any of the following medications:  disulfiram  metronidazole This medicine may also interact with the following medications:  antiviral medicines for hepatitis, HIV or AIDS  certain antibiotics like erythromycin and clarithromycin  certain medicines for fungal infections like ketoconazole and itraconazole  certain medicines for seizures like carbamazepine, phenobarbital, phenytoin  gemfibrozil  nefazodone  rifampin  St. John's wort This list may not describe all possible interactions. Give your health care provider a list of all the medicines, herbs, non-prescription drugs, or dietary supplements you use. Also tell them if you smoke, drink alcohol, or use illegal drugs. Some items may interact with your medicine. What should I watch for while using this medicine? Your condition will be monitored carefully while you are receiving this medicine. You will need important blood work done while you are taking this medicine. This medicine can cause serious allergic reactions. To  reduce your risk you will need to take other medicine(s) before treatment with this medicine. If you experience allergic reactions like skin rash, itching or hives, swelling of the face, lips, or tongue, tell your doctor or health care professional right away. In some cases, you may be given additional medicines to help with side effects. Follow all directions for their use. This drug may make you feel generally unwell. This is not uncommon, as chemotherapy can affect healthy cells as well as cancer cells. Report any side effects. Continue your course of treatment even though you feel ill unless your doctor tells you to stop. Call your doctor or health care professional for advice if you get a fever, chills or sore throat, or other symptoms of a cold or flu. Do not treat yourself. This drug decreases your body's ability to fight infections.  Try to avoid being around people who are sick. This medicine may increase your risk to bruise or bleed. Call your doctor or health care professional if you notice any unusual bleeding. Be careful brushing and flossing your teeth or using a toothpick because you may get an infection or bleed more easily. If you have any dental work done, tell your dentist you are receiving this medicine. Avoid taking products that contain aspirin, acetaminophen, ibuprofen, naproxen, or ketoprofen unless instructed by your doctor. These medicines may hide a fever. Do not become pregnant while taking this medicine. Women should inform their doctor if they wish to become pregnant or think they might be pregnant. There is a potential for serious side effects to an unborn child. Talk to your health care professional or pharmacist for more information. Do not breast-feed an infant while taking this medicine. Men are advised not to father a child while receiving this medicine. This product may contain alcohol. Ask your pharmacist or healthcare provider if this medicine contains alcohol. Be sure  to tell all healthcare providers you are taking this medicine. Certain medicines, like metronidazole and disulfiram, can cause an unpleasant reaction when taken with alcohol. The reaction includes flushing, headache, nausea, vomiting, sweating, and increased thirst. The reaction can last from 30 minutes to several hours. What side effects may I notice from receiving this medicine? Side effects that you should report to your doctor or health care professional as soon as possible:  allergic reactions like skin rash, itching or hives, swelling of the face, lips, or tongue  breathing problems  changes in vision  fast, irregular heartbeat  high or low blood pressure  mouth sores  pain, tingling, numbness in the hands or feet  signs of decreased platelets or bleeding - bruising, pinpoint red spots on the skin, black, tarry stools, blood in the urine  signs of decreased red blood cells - unusually weak or tired, feeling faint or lightheaded, falls  signs of infection - fever or chills, cough, sore throat, pain or difficulty passing urine  signs and symptoms of liver injury like dark yellow or brown urine; general ill feeling or flu-like symptoms; light-colored stools; loss of appetite; nausea; right upper belly pain; unusually weak or tired; yellowing of the eyes or skin  swelling of the ankles, feet, hands  unusually slow heartbeat Side effects that usually do not require medical attention (report to your doctor or health care professional if they continue or are bothersome):  diarrhea  hair loss  loss of appetite  muscle or joint pain  nausea, vomiting  pain, redness, or irritation at site where injected  tiredness This list may not describe all possible side effects. Call your doctor for medical advice about side effects. You may report side effects to FDA at 1-800-FDA-1088. Where should I keep my medicine? This drug is given in a hospital or clinic and will not be stored at  home. NOTE: This sheet is a summary. It may not cover all possible information. If you have questions about this medicine, talk to your doctor, pharmacist, or health care provider.  2020 Elsevier/Gold Standard (2016-09-27 13:14:55)  Carboplatin injection What is this medicine? CARBOPLATIN (KAR boe pla tin) is a chemotherapy drug. It targets fast dividing cells, like cancer cells, and causes these cells to die. This medicine is used to treat ovarian cancer and many other cancers. This medicine may be used for other purposes; ask your health care provider or pharmacist if you have questions. COMMON BRAND  NAME(S): Paraplatin What should I tell my health care provider before I take this medicine? They need to know if you have any of these conditions:  blood disorders  hearing problems  kidney disease  recent or ongoing radiation therapy  an unusual or allergic reaction to carboplatin, cisplatin, other chemotherapy, other medicines, foods, dyes, or preservatives  pregnant or trying to get pregnant  breast-feeding How should I use this medicine? This drug is usually given as an infusion into a vein. It is administered in a hospital or clinic by a specially trained health care professional. Talk to your pediatrician regarding the use of this medicine in children. Special care may be needed. Overdosage: If you think you have taken too much of this medicine contact a poison control center or emergency room at once. NOTE: This medicine is only for you. Do not share this medicine with others. What if I miss a dose? It is important not to miss a dose. Call your doctor or health care professional if you are unable to keep an appointment. What may interact with this medicine?  medicines for seizures  medicines to increase blood counts like filgrastim, pegfilgrastim, sargramostim  some antibiotics like amikacin, gentamicin, neomycin, streptomycin, tobramycin  vaccines Talk to your doctor  or health care professional before taking any of these medicines:  acetaminophen  aspirin  ibuprofen  ketoprofen  naproxen This list may not describe all possible interactions. Give your health care provider a list of all the medicines, herbs, non-prescription drugs, or dietary supplements you use. Also tell them if you smoke, drink alcohol, or use illegal drugs. Some items may interact with your medicine. What should I watch for while using this medicine? Your condition will be monitored carefully while you are receiving this medicine. You will need important blood work done while you are taking this medicine. This drug may make you feel generally unwell. This is not uncommon, as chemotherapy can affect healthy cells as well as cancer cells. Report any side effects. Continue your course of treatment even though you feel ill unless your doctor tells you to stop. In some cases, you may be given additional medicines to help with side effects. Follow all directions for their use. Call your doctor or health care professional for advice if you get a fever, chills or sore throat, or other symptoms of a cold or flu. Do not treat yourself. This drug decreases your body's ability to fight infections. Try to avoid being around people who are sick. This medicine may increase your risk to bruise or bleed. Call your doctor or health care professional if you notice any unusual bleeding. Be careful brushing and flossing your teeth or using a toothpick because you may get an infection or bleed more easily. If you have any dental work done, tell your dentist you are receiving this medicine. Avoid taking products that contain aspirin, acetaminophen, ibuprofen, naproxen, or ketoprofen unless instructed by your doctor. These medicines may hide a fever. Do not become pregnant while taking this medicine. Women should inform their doctor if they wish to become pregnant or think they might be pregnant. There is a  potential for serious side effects to an unborn child. Talk to your health care professional or pharmacist for more information. Do not breast-feed an infant while taking this medicine. What side effects may I notice from receiving this medicine? Side effects that you should report to your doctor or health care professional as soon as possible:  allergic reactions like skin  rash, itching or hives, swelling of the face, lips, or tongue  signs of infection - fever or chills, cough, sore throat, pain or difficulty passing urine  signs of decreased platelets or bleeding - bruising, pinpoint red spots on the skin, black, tarry stools, nosebleeds  signs of decreased red blood cells - unusually weak or tired, fainting spells, lightheadedness  breathing problems  changes in hearing  changes in vision  chest pain  high blood pressure  low blood counts - This drug may decrease the number of white blood cells, red blood cells and platelets. You may be at increased risk for infections and bleeding.  nausea and vomiting  pain, swelling, redness or irritation at the injection site  pain, tingling, numbness in the hands or feet  problems with balance, talking, walking  trouble passing urine or change in the amount of urine Side effects that usually do not require medical attention (report to your doctor or health care professional if they continue or are bothersome):  hair loss  loss of appetite  metallic taste in the mouth or changes in taste This list may not describe all possible side effects. Call your doctor for medical advice about side effects. You may report side effects to FDA at 1-800-FDA-1088. Where should I keep my medicine? This drug is given in a hospital or clinic and will not be stored at home. NOTE: This sheet is a summary. It may not cover all possible information. If you have questions about this medicine, talk to your doctor, pharmacist, or health care provider.   2020 Elsevier/Gold Standard (2007-05-01 14:38:05)

## 2019-09-04 NOTE — Progress Notes (Signed)
Nutrition Assessment   Reason for Assessment: Patient identified on Malnutrition Screening report for poor appetite and weight loss   ASSESSMENT:  79 year old male with stage IV non small cell lung cancer.  Past medical history of DM, colon cancer, GERD, HTN, HLD.  Patient receiving carboplatin, taxol, yervoy, opdivo.    Met with patient during infusion.  Patient reports poor appetite since November.  Reports that he takes a few bites and does not want any more.  Breakfast is usually ensure/boost shake.  Lunch is peanut butter jelly sandwich. Dinner wife prepares meat and couple of sides but only takes a few bites.  Likes to snack on nuts.  Drinks 2-3 ensure plus per day.  Denies nausea, constipation, diarrhea, trouble chewing or swallowing.    Medications: remeron added, metformin, MVI, vit D, compazine   Labs: reviewed   Anthropometrics:   Height: 67.5 inches Weight: 155 lb yesterday 179 lb on 5/27 BMI: 23 13% weight loss in the last  2 months, significant  Estimated Energy Needs  Kcals: 2100-2450 Protein: 105-122 g Fluid: > 2 L   NUTRITION DIAGNOSIS: Inadequate oral intake related to cancer and cancer related treatment side effects as evidenced by 13% weight loss in the last 2 months and poor appetite/taking bites since November  INTERVENTION:  Discussed ways to add calories and protein to current eating pattern.  Handout provided.  Encouraged setting schedule to eat (q 2 hours). Discussed high calorie, high protein snacks Encouraged 350 calorie shakes for added calories as often as can tolerate.  Contact information provided   MONITORING, EVALUATION, GOAL: weight trends, intake   Next Visit: August 18 during infusion  Shaun Kennedy, [place], [place] Registered Dietitian [phone] (mobile)

## 2019-09-04 NOTE — Progress Notes (Unsigned)
Completed Yervoy patient monitoring list with patient. Patient had responses that required further evaluation. Per Dr. Julien Nordmann, okay to proceed with [facility] today.

## 2019-09-05 ENCOUNTER — Telehealth: Payer: Self-pay | Admitting: *Deleted

## 2019-09-05 ENCOUNTER — Ambulatory Visit
Admission: RE | Admit: 2019-09-05 | Discharge: 2019-09-05 | Disposition: A | Discharge status: Home / Self Care | Payer: Medicare HMO | Source: Ambulatory Visit | Attending: Radiation Oncology | Admitting: Radiation Oncology

## 2019-09-05 DIAGNOSIS — Z510 Encounter for antineoplastic radiation therapy: Secondary | ICD-10-CM | POA: Diagnosis not present

## 2019-09-05 NOTE — Telephone Encounter (Signed)
Called pt to see how he did with his treatment yesterday.  He reports Ok except he feels short of breath-"short winded".  He sounded a little SOB on phone.  He reports that he feels worse inside than outside.  He reports that this is new since yesterday.  He denies N/V, diarrhea, or pain.  He does report some constipation but took medication which has helped.  Message routed to Dr Julien Nordmann pod. He is coming for radiation this pm.  Informed to discuss with them also downstairs.

## 2019-09-06 ENCOUNTER — Ambulatory Visit
Admission: RE | Admit: 2019-09-06 | Discharge: 2019-09-06 | Disposition: A | Discharge status: Home / Self Care | Payer: Medicare HMO | Source: Ambulatory Visit | Attending: Radiation Oncology | Admitting: Radiation Oncology

## 2019-09-06 ENCOUNTER — Encounter (HOSPITAL_COMMUNITY): Payer: Medicare HMO

## 2019-09-06 ENCOUNTER — Telehealth: Payer: Self-pay | Admitting: Internal Medicine

## 2019-09-06 ENCOUNTER — Ambulatory Visit (HOSPITAL_COMMUNITY)
Admission: RE | Admit: 2019-09-06 | Discharge: 2019-09-06 | Disposition: A | Discharge status: Home / Self Care | Payer: Medicare HMO | Source: Ambulatory Visit | Attending: Internal Medicine | Admitting: Internal Medicine

## 2019-09-06 ENCOUNTER — Other Ambulatory Visit: Payer: Self-pay

## 2019-09-06 DIAGNOSIS — I6782 Cerebral ischemia: Secondary | ICD-10-CM | POA: Diagnosis not present

## 2019-09-06 DIAGNOSIS — I6389 Other cerebral infarction: Secondary | ICD-10-CM | POA: Diagnosis not present

## 2019-09-06 DIAGNOSIS — C3490 Malignant neoplasm of unspecified part of unspecified bronchus or lung: Secondary | ICD-10-CM | POA: Insufficient documentation

## 2019-09-06 DIAGNOSIS — C3432 Malignant neoplasm of lower lobe, left bronchus or lung: Secondary | ICD-10-CM | POA: Diagnosis not present

## 2019-09-06 DIAGNOSIS — Z510 Encounter for antineoplastic radiation therapy: Secondary | ICD-10-CM | POA: Diagnosis not present

## 2019-09-06 MED ORDER — GADOBUTROL 1 MMOL/ML IV SOLN
7.0000 mL | Freq: Once | INTRAVENOUS | Status: AC | PRN
  Administered 2019-09-06: 7 mL via INTRAVENOUS

## 2019-09-06 NOTE — Telephone Encounter (Signed)
Release: [id number] Faxed medical records to [facility] healthcare @ [phone]

## 2019-09-08 ENCOUNTER — Other Ambulatory Visit: Payer: Self-pay

## 2019-09-08 ENCOUNTER — Encounter: Payer: Self-pay | Admitting: Family Medicine

## 2019-09-08 ENCOUNTER — Encounter (HOSPITAL_COMMUNITY): Payer: Self-pay

## 2019-09-08 DIAGNOSIS — I10 Essential (primary) hypertension: Secondary | ICD-10-CM | POA: Insufficient documentation

## 2019-09-08 DIAGNOSIS — Z79899 Other long term (current) drug therapy: Secondary | ICD-10-CM | POA: Insufficient documentation

## 2019-09-08 DIAGNOSIS — J45909 Unspecified asthma, uncomplicated: Secondary | ICD-10-CM | POA: Insufficient documentation

## 2019-09-08 DIAGNOSIS — C189 Malignant neoplasm of colon, unspecified: Secondary | ICD-10-CM | POA: Diagnosis not present

## 2019-09-08 DIAGNOSIS — E1140 Type 2 diabetes mellitus with diabetic neuropathy, unspecified: Secondary | ICD-10-CM | POA: Insufficient documentation

## 2019-09-08 DIAGNOSIS — K5900 Constipation, unspecified: Secondary | ICD-10-CM | POA: Diagnosis not present

## 2019-09-08 DIAGNOSIS — C3492 Malignant neoplasm of unspecified part of left bronchus or lung: Secondary | ICD-10-CM | POA: Insufficient documentation

## 2019-09-08 DIAGNOSIS — Z7984 Long term (current) use of oral hypoglycemic drugs: Secondary | ICD-10-CM | POA: Diagnosis not present

## 2019-09-08 DIAGNOSIS — Z87891 Personal history of nicotine dependence: Secondary | ICD-10-CM | POA: Diagnosis not present

## 2019-09-08 NOTE — ED Triage Notes (Signed)
Arrived POV from home. Patient reports constipation X 4-5 days. Patient states he has taken Miralax at home without relief.

## 2019-09-09 ENCOUNTER — Emergency Department (HOSPITAL_COMMUNITY)
Admission: EM | Admit: 2019-09-09 | Discharge: 2019-09-09 | Disposition: A | Discharge status: Home / Self Care | Payer: Medicare HMO | Attending: Emergency Medicine | Admitting: Emergency Medicine

## 2019-09-09 ENCOUNTER — Emergency Department (HOSPITAL_COMMUNITY): Payer: Medicare HMO

## 2019-09-09 ENCOUNTER — Encounter (HOSPITAL_COMMUNITY): Payer: Self-pay | Admitting: Student

## 2019-09-09 ENCOUNTER — Inpatient Hospital Stay: Payer: Medicare HMO

## 2019-09-09 ENCOUNTER — Ambulatory Visit
Admission: RE | Admit: 2019-09-09 | Discharge: 2019-09-09 | Disposition: A | Discharge status: Home / Self Care | Payer: Medicare HMO | Source: Ambulatory Visit | Attending: Radiation Oncology | Admitting: Radiation Oncology

## 2019-09-09 ENCOUNTER — Encounter: Payer: Self-pay | Admitting: Internal Medicine

## 2019-09-09 ENCOUNTER — Other Ambulatory Visit: Payer: Self-pay

## 2019-09-09 ENCOUNTER — Inpatient Hospital Stay: Payer: Medicare HMO | Attending: Internal Medicine | Admitting: Internal Medicine

## 2019-09-09 VITALS — BP 147/69 | HR 97 | Temp 97.9°F | Resp 20 | Ht 67.0 in | Wt 156.9 lb

## 2019-09-09 DIAGNOSIS — C771 Secondary and unspecified malignant neoplasm of intrathoracic lymph nodes: Secondary | ICD-10-CM | POA: Insufficient documentation

## 2019-09-09 DIAGNOSIS — Z79899 Other long term (current) drug therapy: Secondary | ICD-10-CM | POA: Diagnosis not present

## 2019-09-09 DIAGNOSIS — N400 Enlarged prostate without lower urinary tract symptoms: Secondary | ICD-10-CM | POA: Insufficient documentation

## 2019-09-09 DIAGNOSIS — Z510 Encounter for antineoplastic radiation therapy: Secondary | ICD-10-CM | POA: Insufficient documentation

## 2019-09-09 DIAGNOSIS — Z7984 Long term (current) use of oral hypoglycemic drugs: Secondary | ICD-10-CM | POA: Diagnosis not present

## 2019-09-09 DIAGNOSIS — Z7952 Long term (current) use of systemic steroids: Secondary | ICD-10-CM | POA: Diagnosis not present

## 2019-09-09 DIAGNOSIS — Z5112 Encounter for antineoplastic immunotherapy: Secondary | ICD-10-CM | POA: Insufficient documentation

## 2019-09-09 DIAGNOSIS — Z87891 Personal history of nicotine dependence: Secondary | ICD-10-CM | POA: Diagnosis not present

## 2019-09-09 DIAGNOSIS — Z5111 Encounter for antineoplastic chemotherapy: Secondary | ICD-10-CM | POA: Insufficient documentation

## 2019-09-09 DIAGNOSIS — C3432 Malignant neoplasm of lower lobe, left bronchus or lung: Secondary | ICD-10-CM | POA: Insufficient documentation

## 2019-09-09 DIAGNOSIS — Z5189 Encounter for other specified aftercare: Secondary | ICD-10-CM | POA: Diagnosis not present

## 2019-09-09 DIAGNOSIS — J45909 Unspecified asthma, uncomplicated: Secondary | ICD-10-CM | POA: Insufficient documentation

## 2019-09-09 DIAGNOSIS — Z7982 Long term (current) use of aspirin: Secondary | ICD-10-CM | POA: Insufficient documentation

## 2019-09-09 DIAGNOSIS — I10 Essential (primary) hypertension: Secondary | ICD-10-CM | POA: Diagnosis not present

## 2019-09-09 DIAGNOSIS — E785 Hyperlipidemia, unspecified: Secondary | ICD-10-CM | POA: Insufficient documentation

## 2019-09-09 DIAGNOSIS — K5900 Constipation, unspecified: Secondary | ICD-10-CM | POA: Diagnosis not present

## 2019-09-09 DIAGNOSIS — Z85038 Personal history of other malignant neoplasm of large intestine: Secondary | ICD-10-CM | POA: Insufficient documentation

## 2019-09-09 DIAGNOSIS — E119 Type 2 diabetes mellitus without complications: Secondary | ICD-10-CM | POA: Diagnosis not present

## 2019-09-09 DIAGNOSIS — K219 Gastro-esophageal reflux disease without esophagitis: Secondary | ICD-10-CM | POA: Diagnosis not present

## 2019-09-09 LAB — COMPREHENSIVE METABOLIC PANEL
ALT: 15 U/L (ref 0–44)
AST: 17 U/L (ref 15–41)
Albumin: 3.5 g/dL (ref 3.5–5.0)
Alkaline Phosphatase: 53 U/L (ref 38–126)
Anion gap: 10 (ref 5–15)
BUN: 26 mg/dL — ABNORMAL HIGH (ref 8–23)
CO2: 29 mmol/L (ref 22–32)
Calcium: 9.5 mg/dL (ref 8.9–10.3)
Chloride: 94 mmol/L — ABNORMAL LOW (ref 98–111)
Creatinine, Ser: 0.9 mg/dL (ref 0.61–1.24)
GFR calc Af Amer: >60 mL/min (ref >60)
GFR calc non Af Amer: >60 mL/min (ref >60)
Glucose, Bld: 153 mg/dL — ABNORMAL HIGH (ref 70–99)
Potassium: 5 mmol/L (ref 3.5–5.1)
Sodium: 133 mmol/L — ABNORMAL LOW (ref 135–145)
Total Bilirubin: 0.7 mg/dL (ref 0.3–1.2)
Total Protein: 8.4 g/dL — ABNORMAL HIGH (ref 6.5–8.1)

## 2019-09-09 LAB — CBC WITH DIFFERENTIAL/PLATELET
Abs Immature Granulocytes: 0.04 10*3/uL (ref 0.00–0.07)
Basophils Absolute: 0 10*3/uL (ref 0.0–0.1)
Basophils Relative: 0 %
Eosinophils Absolute: 0.2 10*3/uL (ref 0.0–0.5)
Eosinophils Relative: 3 %
HCT: 28.3 % — ABNORMAL LOW (ref 39.0–52.0)
Hemoglobin: 9.2 g/dL — ABNORMAL LOW (ref 13.0–17.0)
Immature Granulocytes: 1 %
Lymphocytes Relative: 9 %
Lymphs Abs: 0.6 10*3/uL — ABNORMAL LOW (ref 0.7–4.0)
MCH: 28.8 pg (ref 26.0–34.0)
MCHC: 32.5 g/dL (ref 30.0–36.0)
MCV: 88.4 fL (ref 80.0–100.0)
Monocytes Absolute: 0.1 10*3/uL (ref 0.1–1.0)
Monocytes Relative: 2 %
Neutro Abs: 5.8 10*3/uL (ref 1.7–7.7)
Neutrophils Relative %: 85 %
Platelets: 399 10*3/uL (ref 150–400)
RBC: 3.2 MIL/uL — ABNORMAL LOW (ref 4.22–5.81)
RDW: 12.3 % (ref 11.5–15.5)
WBC: 6.8 10*3/uL (ref 4.0–10.5)
nRBC: 0 % (ref 0.0–0.2)

## 2019-09-09 LAB — MAGNESIUM: Magnesium: 2.1 mg/dL (ref 1.7–2.4)

## 2019-09-09 MED ORDER — DOCUSATE SODIUM 100 MG PO CAPS
100.0000 mg | ORAL_CAPSULE | Freq: Two times a day (BID) | ORAL | 0 refills | Status: DC | PRN
Start: 2019-09-09 — End: 2019-12-18

## 2019-09-09 MED ORDER — FLEET ENEMA 7-19 GM/118ML RE ENEM
1.0000 | ENEMA | Freq: Once | RECTAL | Status: DC
  Filled 2019-09-09: qty 1

## 2019-09-09 NOTE — ED Provider Notes (Signed)
[facility] DEPT Provider Note   CSN: [id number] Arrival date & time: 09/08/19  1606     History Chief Complaint  Patient presents with   Constipation    Shaun Kennedy is a 79 y.o. male with a history of diabetes mellitus, hypertension, hyperlipidemia, lung cancer receiving chemotherapy & radiation, and no prior abdominal surgeries who presents to the ED with complaints of constipation x 3-4 days. Patient states his last BM was 3-4 days ago, it was normal, however since then he has not been able to have another good BM. He feels like he has to go but cannot, only small watery stool comes out occasionally. Tried miralax x 4 today without relief. Denies fever, chills, nausea, vomiting, abdominal pain, melena, or hematochezia.   HPI     Past Medical History:  Diagnosis Date   Allergic rhinitis    Asthma    Carotid stenosis    Colon cancer ([place]) 2003   Diabetes mellitus ([place])    Diverticulosis    Dyslipidemia    ED (erectile dysfunction)    GERD (gastroesophageal reflux disease)    H/O degenerative disc disease    Hemorrhoids    HTN (hypertension)    Hyperlipidemia    LVH (left ventricular hypertrophy)    on EKG   Mass of lower lobe of left lung    Mediastinal adenopathy    Smoker    former   Wears dentures    Wears glasses     Patient Active Problem List   Diagnosis Date Noted   Decreased appetite 09/03/2019   Malignant neoplasm of lower lobe of left lung ([place]) 08/28/2019   Encounter for antineoplastic chemotherapy 08/24/2019   Encounter for antineoplastic immunotherapy 08/24/2019   Goals of care, counseling/discussion 08/24/2019   Mediastinal adenopathy 08/20/2019   Mass of left lung 08/19/2019   Atherosclerosis of aorta ([place]) 04/09/2019   Carpal tunnel syndrome, left upper limb 02/19/2019   History of carpal tunnel surgery of right wrist 12/21/2018   OAB (overactive bladder) 10/11/2017   Diabetic  nephropathy associated with type 2 diabetes mellitus ([place]) 10/11/2017   Onychomycosis of multiple toenails with type 2 diabetes mellitus ([place]) 07/22/2015   Former smoker, stopped smoking in distant past 07/22/2015   Diabetes mellitus ([place]) 06/13/2011   ED (erectile dysfunction) 06/13/2011   History of prostate cancer 06/13/2011   Hypertension associated with diabetes ([place]) 06/13/2011   Hyperlipidemia associated with type 2 diabetes mellitus ([place]) 06/13/2011   Allergic rhinitis, mild 06/13/2011   History of asthma 06/13/2011   Carotid stenosis 06/13/2011    Past Surgical History:  Procedure Laterality Date   BRONCHIAL BIOPSY  08/20/2019   Procedure: BRONCHIAL BIOPSIES;  Surgeon: Collene Gobble, MD;  Location: [facility] ENDOSCOPY;  Service: Pulmonary;;   BRONCHIAL BRUSHINGS  08/20/2019   Procedure: BRONCHIAL BRUSHINGS;  Surgeon: Collene Gobble, MD;  Location: [facility] ENDOSCOPY;  Service: Pulmonary;;   BRONCHIAL NEEDLE ASPIRATION BIOPSY  08/20/2019   Procedure: BRONCHIAL NEEDLE ASPIRATION BIOPSIES;  Surgeon: Collene Gobble, MD;  Location: [facility] ENDOSCOPY;  Service: Pulmonary;;   CARPAL TUNNEL RELEASE Right 01/09/2019   Procedure: CARPAL TUNNEL RELEASE;  Surgeon: Leandrew Koyanagi, MD;  Location: [facility];  Service: Orthopedics;  Laterality: Right;   CATARACT EXTRACTION Right 2018   COLONOSCOPY  2007   Dr. Benson [place]   I & D EXTREMITY Right 04/02/2016   Procedure: IRRIGATION AND DEBRIDEMENT GREAT TOE;  Surgeon: Leandrew Koyanagi, MD;  Location: [place];  Service: Orthopedics;  Laterality: Right;   IR IMAGING GUIDED PORT INSERTION  08/30/2019   MULTIPLE TOOTH EXTRACTIONS     RADIOACTIVE SEED IMPLANT  2003   ULNAR TUNNEL RELEASE Right 01/09/2019   Procedure: RIGHT CUBITAL TUNNEL RELEASE AND CARPAL TUNNEL RELEASE;  Surgeon: Leandrew Koyanagi, MD;  Location: [facility];  Service: Orthopedics;  Laterality: Right;   UPPER GASTROINTESTINAL ENDOSCOPY     VIDEO BRONCHOSCOPY WITH  ENDOBRONCHIAL ULTRASOUND N/A 08/20/2019   Procedure: VIDEO BRONCHOSCOPY WITH ENDOBRONCHIAL ULTRASOUND;  Surgeon: Collene Gobble, MD;  Location: [facility] ENDOSCOPY;  Service: Pulmonary;  Laterality: N/A;       Family History  Problem Relation Age of Onset   Heart failure Mother    Diabetes Mother    Stroke Father    Diabetes Father    Diabetes Sister    Diabetes Sister    Lung cancer Sister 34   Colon cancer Neg Hx    Esophageal cancer Neg Hx    Rectal cancer Neg Hx    Colon polyps Neg Hx    Stomach cancer Neg Hx     Social History   Tobacco Use   Smoking status: Former Smoker    Packs/day: 1.00    Years: 32.00    Pack years: 32.00    Start date: 02/08/1955    Quit date: 10/09/1987    Years since quitting: 31.9   Smokeless tobacco: Never Used  Vaping Use   Vaping Use: Never used  Substance Use Topics   Alcohol use: No   Drug use: No    Home Medications Prior to Admission medications   Medication Sig Start Date End Date Taking? Authorizing Provider  amLODipine (NORVASC) 5 MG tablet Take 1 tablet (5 mg total) by mouth daily. Patient taking differently: Take 5 mg by mouth every evening.  10/17/18   [name], MD  aspirin EC 81 MG tablet Take 81 mg by mouth daily after breakfast.     [provider]  Blood Glucose Monitoring Suppl (ONETOUCH VERIO) w/Device KIT USE TO CHECK SUGAR DAILY 02/21/17   [name], MD  Blood Glucose Monitoring Suppl [facility] VERIO) w/Device KIT USE TO CHECK SUGAR DAILY 03/27/18   [name], MD  cholecalciferol (VITAMIN D) 1000 units tablet Take 1,000 Units by mouth daily after breakfast.     [provider]  Lancets (ONETOUCH DELICA PLUS PQAESL75P) [place] PATIENT IS TO TEST TWO TIMES A DAY DX: E11.9 (ONETOUCH VERIO FLEX) 04/23/19   [name], MD  lidocaine-prilocaine (EMLA) cream Apply to the Port-A-Cath site 30-60-minute before chemotherapy. 08/24/19   Curt Bears, MD  lisinopril-hydrochlorothiazide  (ZESTORETIC) 20-12.5 MG tablet Take 1 tablet by mouth daily. Patient taking differently: Take 1 tablet by mouth daily after breakfast.  10/17/18   [name], MD  LORazepam (ATIVAN) 0.5 MG tablet 1 tablet 30 minutes before the MRI.  Repeat once if needed. 08/28/19   Curt Bears, MD  metFORMIN (GLUCOPHAGE-XR) 500 MG 24 hr tablet TAKE 1 TABLET BY MOUTH EVERY DAY Patient taking differently: Take 500 mg by mouth daily after breakfast.  07/05/19   [name], MD  methylPREDNISolone (MEDROL DOSEPAK) 4 MG TBPK tablet Use as instructed. 08/23/19   Curt Bears, MD  mirtazapine (REMERON) 15 MG tablet Take 1 tablet (15 mg total) by mouth at bedtime. 09/03/19   Heilingoetter, Cassandra L, PA-C  Multiple Vitamin (MULTIVITAMIN WITH MINERALS) TABS tablet Take 1 tablet by mouth daily after breakfast.  [provider]  [facility] VERIO test strip USE AS INSTRUCTED TO CHECK TWICE DAILY 09/17/18   [name], MD  prochlorperazine (COMPAZINE) 10 MG tablet Take 1 tablet (10 mg total) by mouth every 6 (six) hours as needed for nausea or vomiting. 08/23/19   Curt Bears, MD  simvastatin (ZOCOR) 40 MG tablet Take 1 tablet (40 mg total) by mouth daily. Patient taking differently: Take 40 mg by mouth every evening.  10/17/18   [name], MD    Allergies    Patient has no known allergies.  Review of Systems   Review of Systems  Constitutional: Negative for chills and fever.  Respiratory: Negative for cough and shortness of breath.   Cardiovascular: Negative for chest pain.  Gastrointestinal: Positive for constipation. Negative for abdominal pain, anal bleeding, blood in stool, nausea and vomiting.  Genitourinary: Negative for dysuria.  Neurological: Negative for syncope.  All other systems reviewed and are negative.   Physical Exam Updated Vital Signs BP (!) 131/65 (BP Location: Left Arm)    Pulse 103    Temp 98 F (36.7 C) (Oral)    Resp 18    Ht _0  (1.702 m)    Wt 70.3 kg     SpO2 97%    BMI 24.28 kg/m   Physical Exam Vitals and nursing note reviewed.  Constitutional:      General: He is not in acute distress.    Appearance: He is well-developed. He is not toxic-appearing.  HENT:     Head: Normocephalic and atraumatic.  Eyes:     General:        Right eye: No discharge.        Left eye: No discharge.     Conjunctiva/sclera: Conjunctivae normal.  Cardiovascular:     Rate and Rhythm: Normal rate and regular rhythm.  Pulmonary:     Effort: Pulmonary effort is normal. No respiratory distress.     Breath sounds: Normal breath sounds. No wheezing, rhonchi or rales.  Abdominal:     General: There is no distension.     Palpations: Abdomen is soft.     Tenderness: There is no abdominal tenderness. There is no guarding or rebound.  Genitourinary:    Comments: Chaperone present.  DRE performed, stool ball present, disimpacted some, some stool remains in upper rectum.  Musculoskeletal:     Cervical back: Neck supple.  Skin:    General: Skin is warm and dry.     Findings: No rash.  Neurological:     Mental Status: He is alert.     Comments: Clear speech.   Psychiatric:        Behavior: Behavior normal.     ED Results / Procedures / Treatments   Labs (all labs ordered are listed, but only abnormal results are displayed) Labs Reviewed  CBC WITH DIFFERENTIAL/PLATELET - Abnormal; Notable for the following components:      Result Value   RBC 3.20 (*)    Hemoglobin 9.2 (*)    HCT 28.3 (*)    Lymphs Abs 0.6 (*)    All other components within normal limits  COMPREHENSIVE METABOLIC PANEL - Abnormal; Notable for the following components:   Sodium 133 (*)    Chloride 94 (*)    Glucose, Bld 153 (*)    BUN 26 (*)    Total Protein 8.4 (*)    All other components within normal limits  MAGNESIUM    EKG None  Radiology DG Abd  Acute W/Chest  Result Date: 09/09/2019 CLINICAL DATA:  Constipation for 5 days EXAM: DG ABDOMEN ACUTE W/ 1V CHEST COMPARISON:   CT 08/16/2019 FINDINGS: Power port type central venous catheter with tip over the low SVC region. No pneumothorax. Heart size and pulmonary vascularity are normal. Left hilar/perihilar mass is better demonstrated on previous CT chest. No pleural effusion or consolidation. Additional nodules in the left lung apex. Gas and stool throughout the colon. No small or large bowel distention. No free intra-abdominal air. No abnormal air-fluid levels. No radiopaque stones. Radiopaque densities in the pelvis likely represent prostate seed implants. Vascular calcifications. Degenerative changes in the spine and hips. IMPRESSION: 1. Nonobstructive bowel gas pattern with stool-filled colon. 2. Left hilar/perihilar mass with additional nodules in the left lung apex. Electronically Signed   By: Lucienne Capers M.D.   On: 09/09/2019 04:04    Procedures Procedures (including critical care time)  Medications Ordered in ED Medications - No data to display  ED Course  I have reviewed the triage vital signs and the nursing notes.  Pertinent labs & imaging results that were available during my care of the patient were reviewed by me and considered in my medical decision making (see chart for details).    MDM Rules/Calculators/A&P                         Patient presents to the ED with complaints of constipation.  Patient is nontoxic and resting comfortably, vitals without significant abnormality.  Abdominal exam is benign, nontender, no peritoneal signs.  Additional history obtained:  Additional history obtained from chart & nursing note review.  Lab Tests:  I reviewed and interpreted labs, which included:  CBC: Anemia similar to prior. No leukocytosis.  CMP: Mild electrolyte abnormalities without significant derangement. Magnesium: Within normal limits  Imaging Studies ordered:  I ordered imaging studies which included acute abdominal series, I independently visualized and interpreted imaging which showed  nonobstructive bowel gas pattern with stool-filled colon noted.  Mass consistent with his history of lung cancer.  I performed a digital rectal exam with stool ball noted in the rectal vault, disimpacted a moderate amount of stool, however some still remained, with ED observation he was able to have a bowel movement after this.  Do not suspect surgical abdomen at this time, doubt bowel obstruction, perforation, intra-abdominal abscess, appendicitis, or cholecystitis.  Will have patient continue use of MiraLAX, will also prescribe docusate. I discussed results, treatment plan, need for follow-up, and return precautions with the patient. Provided opportunity for questions, patient confirmed understanding and is in agreement with plan.   Findings and plan of care discussed with supervising physician Dr. Darl Householder who is in agreement.   Portions of this note were generated with [employer]. Dictation errors may occur despite best attempts at proofreading.  Patient presents to theFinal Clinical Impression(s) / ED Diagnoses Final diagnoses:  Constipation, unspecified constipation type    Rx / DC Orders ED Discharge Orders         Ordered    docusate sodium (COLACE) 100 MG capsule  2 times daily PRN     Discontinue  Reprint     09/09/19 0612           Bryer Gottsch, Glynda Jaeger, PA-C 09/09/19 0102    Drenda Freeze, MD 09/09/19 [phone]

## 2019-09-09 NOTE — Progress Notes (Signed)
[facility] [phone]   [phone]  OFFICE PROGRESS NOTE  Shaun Kennedy, [place] [address]  DIAGNOSIS: stage IV (T3, N2, M1 a) non-small cell Kennedy cancer, squamous cell carcinoma presented with obstructive left lower lobe Kennedy mass in addition to mediastinal lymphadenopathy as well as bilateral pulmonary nodules diagnosed in July 2021.  PDL1: 0%  PRIOR THERAPY: None  CURRENT THERAPY: 1) Palliative radiotherapy to the obstructive left lower lobe Kennedy mass under the care of Dr. Lisbeth Renshaw.  2) 2 cycles of chemotherapy with carboplatin for an AUC of 5 and paclitaxel 175 mg/m2 in addition to immunotherapy with nivolumab 360 mg every 3 weeks and ipilimumab 1 mg/kg IV every 6 weeks followed by maintenance nivolumab and ipilimumab.  First dose expected on 09/04/19  INTERVAL HISTORY: Shaun Kennedy 79 y.o. male returns to the clinic today for follow-up visit accompanied by his son and his daughter was available by phone during the visit.  The patient is feeling fine today with no concerning complaints except for feeling cold.  He started systemic chemotherapy last week and tolerated the first week of his treatment fairly well.  He denied having any chest pain, shortness of breath, cough or hemoptysis.  He denied having any nausea, vomiting, diarrhea but has constipation.  He has no headache or visual changes.  He is here today for evaluation after the first dose of his treatment.  He missed his Neulasta injection.  MEDICAL HISTORY: Past Medical History:  Diagnosis Date  . Allergic rhinitis   . Asthma   . Carotid stenosis   . Colon cancer ([place]) 2003  . Diabetes mellitus ([place])   . Diverticulosis   . Dyslipidemia   . ED (erectile dysfunction)   . GERD (gastroesophageal reflux disease)   . H/O degenerative disc disease   . Hemorrhoids   . HTN (hypertension)   . Hyperlipidemia   . LVH (left ventricular hypertrophy)    on  EKG  . Mass of lower lobe of left Kennedy   . Mediastinal adenopathy   . Smoker    former  . Wears dentures   . Wears glasses     ALLERGIES:  has No Known Allergies.  MEDICATIONS:  Current Outpatient Medications  Medication Sig Dispense Refill  . amLODipine (NORVASC) 5 MG tablet Take 1 tablet (5 mg total) by mouth daily. (Patient taking differently: Take 5 mg by mouth every evening. ) 90 tablet 3  . aspirin EC 81 MG tablet Take 81 mg by mouth daily after breakfast.     . Blood Glucose Monitoring Suppl (ONETOUCH VERIO) w/Device KIT USE TO CHECK SUGAR DAILY 1 kit 0  . Blood Glucose Monitoring Suppl (ONETOUCH VERIO) w/Device KIT USE TO CHECK SUGAR DAILY 1 kit 0  . cholecalciferol (VITAMIN D) 1000 units tablet Take 1,000 Units by mouth daily after breakfast.     . docusate sodium (COLACE) 100 MG capsule Take 1 capsule (100 mg total) by mouth 2 (two) times daily as needed for mild constipation. 15 capsule 0  . Lancets (ONETOUCH DELICA PLUS [id number]) MISC PATIENT IS TO TEST TWO TIMES A DAY DX: E11.9 (ONETOUCH VERIO FLEX) 100 each 12  . lidocaine-prilocaine (EMLA) cream Apply to the Port-A-Cath site 30-60-minute before chemotherapy. 30 g 0  . lisinopril-hydrochlorothiazide (ZESTORETIC) 20-12.5 MG tablet Take 1 tablet by mouth daily. (Patient taking differently: Take 1 tablet by mouth daily after breakfast. ) 90 tablet 2  . LORazepam (ATIVAN)  0.5 MG tablet 1 tablet 30 minutes before the MRI.  Repeat once if needed. 2 tablet 0  . metFORMIN (GLUCOPHAGE-XR) 500 MG 24 hr tablet TAKE 1 TABLET BY MOUTH EVERY DAY (Patient taking differently: Take 500 mg by mouth daily after breakfast. ) 90 tablet 1  . methylPREDNISolone (MEDROL DOSEPAK) 4 MG TBPK tablet Use as instructed. 21 tablet 0  . mirtazapine (REMERON) 15 MG tablet Take 1 tablet (15 mg total) by mouth at bedtime. 30 tablet 2  . Multiple Vitamin (MULTIVITAMIN WITH MINERALS) TABS tablet Take 1 tablet by mouth daily after breakfast.     . ONETOUCH  VERIO test strip USE AS INSTRUCTED TO CHECK TWICE DAILY 200 strip 5  . prochlorperazine (COMPAZINE) 10 MG tablet Take 1 tablet (10 mg total) by mouth every 6 (six) hours as needed for nausea or vomiting. 30 tablet 0  . simvastatin (ZOCOR) 40 MG tablet Take 1 tablet (40 mg total) by mouth daily. (Patient taking differently: Take 40 mg by mouth every evening. ) 90 tablet 3   No current facility-administered medications for this visit.   Facility-Administered Medications Ordered in Other Visits  Medication Dose Route Frequency Provider Last Rate Last Admin  . sodium chloride flush (NS) 0.9 % injection 10 mL  10 mL Intracatheter PRN Curt Bears, MD   10 mL at 09/04/19 1556    SURGICAL HISTORY:  Past Surgical History:  Procedure Laterality Date  . BRONCHIAL BIOPSY  08/20/2019   Procedure: BRONCHIAL BIOPSIES;  Surgeon: Collene Gobble, MD;  Location: [facility] ENDOSCOPY;  Service: Pulmonary;;  . BRONCHIAL BRUSHINGS  08/20/2019   Procedure: BRONCHIAL BRUSHINGS;  Surgeon: Collene Gobble, MD;  Location: [facility] ENDOSCOPY;  Service: Pulmonary;;  . BRONCHIAL NEEDLE ASPIRATION BIOPSY  08/20/2019   Procedure: BRONCHIAL NEEDLE ASPIRATION BIOPSIES;  Surgeon: Collene Gobble, MD;  Location: [facility] ENDOSCOPY;  Service: Pulmonary;;  . CARPAL TUNNEL RELEASE Right 01/09/2019   Procedure: CARPAL TUNNEL RELEASE;  Surgeon: Leandrew Koyanagi, MD;  Location: [place];  Service: Orthopedics;  Laterality: Right;  . CATARACT EXTRACTION Right 2018  . COLONOSCOPY  2007   Dr. Benson Norway  . I & D EXTREMITY Right 04/02/2016   Procedure: IRRIGATION AND DEBRIDEMENT GREAT TOE;  Surgeon: Leandrew Koyanagi, MD;  Location: [place];  Service: Orthopedics;  Laterality: Right;  . IR IMAGING GUIDED PORT INSERTION  08/30/2019  . MULTIPLE TOOTH EXTRACTIONS    . RADIOACTIVE SEED IMPLANT  2003  . ULNAR TUNNEL RELEASE Right 01/09/2019   Procedure: RIGHT CUBITAL TUNNEL RELEASE AND CARPAL TUNNEL RELEASE;  Surgeon: Leandrew Koyanagi, MD;  Location: [place];  Service: Orthopedics;  Laterality: Right;  . UPPER GASTROINTESTINAL ENDOSCOPY    . VIDEO BRONCHOSCOPY WITH ENDOBRONCHIAL ULTRASOUND N/A 08/20/2019   Procedure: VIDEO BRONCHOSCOPY WITH ENDOBRONCHIAL ULTRASOUND;  Surgeon: Collene Gobble, MD;  Location: [facility] ENDOSCOPY;  Service: Pulmonary;  Laterality: N/A;    REVIEW OF SYSTEMS:  Constitutional: positive for fatigue Eyes: negative Ears, nose, mouth, throat, and face: negative Respiratory: negative Cardiovascular: negative Gastrointestinal: positive for constipation Genitourinary:negative Integument/breast: negative Hematologic/lymphatic: negative Musculoskeletal:negative Neurological: negative Behavioral/Psych: negative Endocrine: negative Allergic/Immunologic: negative   PHYSICAL EXAMINATION: General appearance: alert, cooperative, fatigued and no distress Head: Normocephalic, without obvious abnormality, atraumatic Neck: no adenopathy, no JVD, supple, symmetrical, trachea midline and thyroid not enlarged, symmetric, no tenderness/mass/nodules Lymph nodes: Cervical, supraclavicular, and axillary nodes normal. Resp: clear to auscultation bilaterally Back: symmetric, no curvature. ROM normal. No CVA tenderness. Cardio: regular rate  and rhythm, S1, S2 normal, no murmur, click, rub or gallop GI: soft, non-tender; bowel sounds normal; no masses,  no organomegaly Extremities: extremities normal, atraumatic, no cyanosis or edema Neurologic: Alert and oriented X 3, normal strength and tone. Normal symmetric reflexes. Normal coordination and gait  ECOG PERFORMANCE STATUS: 1 - Symptomatic but completely ambulatory  Blood pressure (!) 147/69, pulse 97, temperature 97.9 F (36.6 C), temperature source Temporal, resp. rate 20, height '5\' 7"'  (1.702 m), weight 156 lb 14.4 oz (71.2 kg), SpO2 99 %.  LABORATORY DATA: Lab Results  Component Value Date   WBC 6.8 09/09/2019   HGB 9.2 (L) 09/09/2019   HCT 28.3 (L) 09/09/2019    MCV 88.4 09/09/2019   PLT 399 09/09/2019      Chemistry      Component Value Date/Time   NA 133 (L) 09/09/2019 0253   NA 132 (L) 08/14/2019 0918   K 5.0 09/09/2019 0253   CL 94 (L) 09/09/2019 0253   CO2 29 09/09/2019 0253   BUN 26 (H) 09/09/2019 0253   BUN 16 08/14/2019 0918   CREATININE 0.90 09/09/2019 0253   CREATININE 1.00 09/03/2019 0843   CREATININE 1.25 (H) 08/08/2016 0912      Component Value Date/Time   CALCIUM 9.5 09/09/2019 0253   ALKPHOS 53 09/09/2019 0253   AST 17 09/09/2019 0253   AST 16 09/03/2019 0843   ALT 15 09/09/2019 0253   ALT 15 09/03/2019 0843   BILITOT 0.7 09/09/2019 0253   BILITOT 0.6 09/03/2019 0843       RADIOGRAPHIC STUDIES: DG Chest 2 View  Result Date: 08/16/2019 CLINICAL DATA:  Shortness of breath, reported esophageal dilation 2 weeks prior EXAM: CHEST - 2 VIEW COMPARISON:  Radiograph 02/04/2015 FINDINGS: Focal masslike opacity in the left perihilar region, could reflect inflection or sequela of aspiration given recent esophageal manipulation however an underlying neoplasm is not excluded given these contours. No pneumothorax or effusion. No convincing features of edema. No acute osseous or soft tissue abnormality. Degenerative changes are present in the imaged spine and shoulders. Telemetry leads overlie the chest. IMPRESSION: Focal masslike opacity in the left perihilar region, could reflect inflection or sequela of aspiration given recent esophageal manipulation however an underlying neoplasm is not excluded given these contours. Recommend further evaluation with contrast enhanced CT. Electronically Signed   By: Lovena Le M.D.   On: 08/16/2019 19:36   CT Chest W Contrast  Result Date: 08/16/2019 CLINICAL DATA:  Chest pain and shortness of breath. EXAM: CT CHEST, ABDOMEN, AND PELVIS WITH CONTRAST TECHNIQUE: Multidetector CT imaging of the chest, abdomen and pelvis was performed following the standard protocol during bolus administration of  intravenous contrast. CONTRAST:  134m OMNIPAQUE IOHEXOL 300 MG/ML  SOLN COMPARISON:  None. FINDINGS: CT CHEST FINDINGS Cardiovascular: No significant vascular findings. Normal heart size. No pericardial effusion. Mediastinum/Nodes: Necrotic pretracheal subcarinal and AP window lymph nodes are seen (the largest measures approximately 2.3 cm x 1.8 cm). Lungs/Pleura: A 6.7 cm x 4.8 cm heterogeneous low-attenuation Kennedy mass is seen within the anteromedial aspect of the left lower lobe. This is most prominent within the region posterior to the left hilum. Numerous bilateral noncalcified Kennedy nodules of various sizes are seen. There is no evidence of acute infiltrate, pleural effusion or pneumothorax. Musculoskeletal: Multilevel degenerative changes seen throughout the thoracic spine. CT ABDOMEN PELVIS FINDINGS Hepatobiliary: No focal liver abnormality is seen. No gallstones, gallbladder wall thickening, or biliary dilatation. Pancreas: Unremarkable. No pancreatic ductal dilatation or surrounding inflammatory  changes. Spleen: Normal in size without focal abnormality. Adrenals/Urinary Tract: Adrenal glands are unremarkable. Kidneys are normal in size, without renal calculi or hydronephrosis. A 1.6 cm simple cyst is seen within the anterior aspect of the mid right kidney. Bladder is unremarkable. Stomach/Bowel: Stomach is within normal limits. The appendix is not clearly identified. No evidence of bowel wall thickening, distention, or inflammatory changes. Noninflamed diverticula are seen throughout the sigmoid colon. Vascular/Lymphatic: There is moderate severity calcification of the abdominal aorta. No enlarged abdominal or pelvic lymph nodes. Reproductive: Prostate radiation implantation seeds are seen with a moderately enlarged prostate gland. Other: No abdominal wall hernia or abnormality. No abdominopelvic ascites. Musculoskeletal: Multilevel degenerative changes seen throughout the lumbar spine. IMPRESSION: 1. 6.7  cm x 4.8 cm heterogeneous low-attenuation left lower lobe Kennedy mass consistent with primary Kennedy malignancy. 2. Numerous bilateral noncalcified Kennedy nodules of various sizes, consistent with metastatic disease. 3. Necrotic pretracheal, subcarinal and AP window lymph nodes. 4. Prostate radiation implantation seeds with a moderately enlarged prostate gland. 5. Noninflamed sigmoid diverticulosis. 6. Aortic atherosclerosis. Aortic Atherosclerosis (ICD10-I70.0). Electronically Signed   By: Virgina Norfolk M.D.   On: 08/16/2019 20:33   MR BRAIN W WO CONTRAST  Result Date: 09/06/2019 CLINICAL DATA:  Non-small cell Kennedy cancer staging EXAM: MRI HEAD WITHOUT AND WITH CONTRAST TECHNIQUE: Multiplanar, multiecho pulse sequences of the brain and surrounding structures were obtained without and with intravenous contrast. CONTRAST:  47m GADAVIST GADOBUTROL 1 MMOL/ML IV SOLN COMPARISON:  None. FINDINGS: Brain: No mass or swelling to suggest metastatic disease. Chronic small vessel ischemia in the cerebral white matter which is extensive and confluent around the lateral ventricles. Chronic lacune at the left thalamus. Normal brain volume. No recent infarct, hemorrhage, or hydrocephalus. Vascular: Normal flow voids and vascular enhancement Skull and upper cervical spine: No evidence of bony metastasis. Sinuses/Orbits: No evidence of metastatic disease IMPRESSION: Negative for metastatic disease. Extensive chronic small vessel ischemia. Electronically Signed   By: JMonte FantasiaM.D.   On: 09/06/2019 09:42   CT ABDOMEN PELVIS W CONTRAST  Result Date: 08/16/2019 CLINICAL DATA:  Chest pain and shortness of breath. EXAM: CT CHEST, ABDOMEN, AND PELVIS WITH CONTRAST TECHNIQUE: Multidetector CT imaging of the chest, abdomen and pelvis was performed following the standard protocol during bolus administration of intravenous contrast. CONTRAST:  1050mOMNIPAQUE IOHEXOL 300 MG/ML  SOLN COMPARISON:  None. FINDINGS: Cardiovascular: No  significant vascular findings. Normal heart size. No pericardial effusion. Mediastinum/Nodes: Necrotic pretracheal subcarinal and AP window lymph nodes are seen (the largest measures approximately 2.3 cm x 1.8 cm). Lungs/Pleura: A 6.7 cm x 4.8 cm heterogeneous low-attenuation Kennedy mass is seen within the anteromedial aspect of the left lower lobe. This is most prominent within the region posterior to the left hilum. Numerous bilateral noncalcified Kennedy nodules of various sizes are seen. There is no evidence of acute infiltrate, pleural effusion or pneumothorax. Musculoskeletal: Multilevel degenerative changes seen throughout the thoracic spine. CT ABDOMEN PELVIS FINDINGS Hepatobiliary: No focal liver abnormality is seen. No gallstones, gallbladder wall thickening, or biliary dilatation. Pancreas: Unremarkable. No pancreatic ductal dilatation or surrounding inflammatory changes. Spleen: Normal in size without focal abnormality. Adrenals/Urinary Tract: Adrenal glands are unremarkable. Kidneys are normal in size, without renal calculi or hydronephrosis. A 1.6 cm simple cyst is seen within the anterior aspect of the mid right kidney. Bladder is unremarkable. Stomach/Bowel: Stomach is within normal limits. The appendix is not clearly identified. No evidence of bowel wall thickening, distention, or inflammatory changes. Noninflamed diverticula are  seen throughout the sigmoid colon. Vascular/Lymphatic: There is moderate severity calcification of the abdominal aorta. No enlarged abdominal or pelvic lymph nodes. Reproductive: Prostate radiation implantation seeds are seen with a moderately enlarged prostate gland. Other: No abdominal wall hernia or abnormality. No abdominopelvic ascites. Musculoskeletal: Multilevel degenerative changes seen throughout the lumbar spine. IMPRESSION: 1. 6.7 cm x 4.8 cm heterogeneous low-attenuation left lower lobe Kennedy mass consistent with primary Kennedy malignancy. 2. Numerous bilateral  noncalcified Kennedy nodules of various sizes, consistent with metastatic disease. 3. Necrotic pretracheal, subcarinal and AP window lymph nodes. 4. Prostate radiation implantation seeds with a moderately enlarged prostate gland. 5. Noninflamed sigmoid diverticulosis. 6. Aortic atherosclerosis. Aortic Atherosclerosis (ICD10-I70.0). Electronically Signed   By: Virgina Norfolk M.D.   On: 08/16/2019 20:33   DG CHEST PORT 1 VIEW  Result Date: 08/20/2019 CLINICAL DATA:  Status post bronchoscopy EXAM: PORTABLE CHEST 1 VIEW COMPARISON:  August 16, 2019 chest radiograph and chest CT FINDINGS: No appreciable pneumothorax. There is again noted a mass overlying the left hilum measuring 5.5 x 4.9 cm. Lungs elsewhere clear. Heart is upper normal in size with pulmonary vascularity normal. No adenopathy. No bone lesions. IMPRESSION: No pneumothorax. Masslike area overlying the left hilum seen on CT to reside in the superior segment left lower lobe region. Lungs otherwise clear. Stable cardiac silhouette. Electronically Signed   By: Lowella Grip III M.D.   On: 08/20/2019 12:03   DG Abd Acute W/Chest  Result Date: 09/09/2019 CLINICAL DATA:  Constipation for 5 days EXAM: DG ABDOMEN ACUTE W/ 1V CHEST COMPARISON:  CT 08/16/2019 FINDINGS: Power port type central venous catheter with tip over the low SVC region. No pneumothorax. Heart size and pulmonary vascularity are normal. Left hilar/perihilar mass is better demonstrated on previous CT chest. No pleural effusion or consolidation. Additional nodules in the left Kennedy apex. Gas and stool throughout the colon. No small or large bowel distention. No free intra-abdominal air. No abnormal air-fluid levels. No radiopaque stones. Radiopaque densities in the pelvis likely represent prostate seed implants. Vascular calcifications. Degenerative changes in the spine and hips. IMPRESSION: 1. Nonobstructive bowel gas pattern with stool-filled colon. 2. Left hilar/perihilar mass with  additional nodules in the left Kennedy apex. Electronically Signed   By: Lucienne Capers M.D.   On: 09/09/2019 04:04   IR IMAGING GUIDED PORT INSERTION  Result Date: 08/30/2019 INDICATION: 79 year old male referred for port catheter placement EXAM: IMAGE GUIDED PORT CATHETER PLACEMENT MEDICATIONS: 2 g Ancef; The antibiotic was administered within an appropriate time interval prior to skin puncture. ANESTHESIA/SEDATION: Moderate (conscious) sedation was employed during this procedure. A total of Versed 1.5 mg and Fentanyl 75 mcg was administered intravenously. Moderate Sedation Time: 14 minutes. The patient's level of consciousness and vital signs were monitored continuously by radiology nursing throughout the procedure under my direct supervision. FLUOROSCOPY TIME:  Fluoroscopy Time: 0 minutes 12 seconds (2 mGy). COMPLICATIONS: None PROCEDURE: The procedure, risks, benefits, and alternatives were explained to the patient. Questions regarding the procedure were encouraged and answered. The patient understands and consents to the procedure. Ultrasound survey was performed with images stored and sent to PACs. The right neck and chest was prepped with chlorhexidine, and draped in the usual sterile fashion using maximum barrier technique (cap and mask, sterile gown, sterile gloves, large sterile sheet, hand hygiene and cutaneous antiseptic). Antibiotic prophylaxis was provided with 2.0g Ancef administered IV one hour prior to skin incision. Local anesthesia was attained by infiltration with 1% lidocaine without epinephrine. Ultrasound demonstrated patency  of the right internal jugular vein, and this was documented with an image. Under real-time ultrasound guidance, this vein was accessed with a 21 gauge micropuncture needle and image documentation was performed. A small dermatotomy was made at the access site with an 11 scalpel. A 0.018" wire was advanced into the SVC and used to estimate the length of the internal  catheter. The access needle exchanged for a 70F micropuncture vascular sheath. The 0.018" wire was then removed and a 0.035" wire advanced into the IVC. An appropriate location for the subcutaneous reservoir was selected below the clavicle and an incision was made through the skin and underlying soft tissues. The subcutaneous tissues were then dissected using a combination of blunt and sharp surgical technique and a pocket was formed. A single lumen power injectable portacatheter was then tunneled through the subcutaneous tissues from the pocket to the dermatotomy and the port reservoir placed within the subcutaneous pocket. The venous access site was then serially dilated and a peel away vascular sheath placed over the wire. The wire was removed and the port catheter advanced into position under fluoroscopic guidance. The catheter tip is positioned in the cavoatrial junction. This was documented with a spot image. The portacatheter was then tested and found to flush and aspirate well. The port was flushed with saline followed by 100 units/mL heparinized saline. The pocket was then closed in two layers using first subdermal inverted interrupted absorbable sutures followed by a running subcuticular suture. The epidermis was then sealed with Dermabond. The dermatotomy at the venous access site was also seal with Dermabond. Patient tolerated the procedure well and remained hemodynamically stable throughout. No complications encountered and no significant blood loss encountered IMPRESSION: Status post right IJ port catheter placement. Signed, Dulcy Fanny. Dellia Nims, RPVI [facility] Radiology Electronically Signed   By: Corrie Mckusick D.O.   On: 08/30/2019 14:49    ASSESSMENT AND PLAN: This is a very pleasant 79 years old [name] with stage IV non-small cell Kennedy cancer, squamous cell carcinoma with negative PD-L1 expression. The patient is currently  undergoing systemic chemotherapy with carboplatin and paclitaxel for 2 cycles in addition to immunotherapy with ipilimumab 1 mg/KG every 6 weeks and nivolumab 360 mg IV every 3 weeks status post 1 cycle started last week. He tolerated the first cycle of his treatment fairly well. I recommended for the patient to continue with weekly monitoring of his blood work since he missed his Neulasta injection. For the constipation he will continue on MiraLAX and we will consider the patient for milk of magnesia if no improvement. He will come back for follow-up visit in 2 weeks for evaluation before starting cycle #2 of his chemotherapy. The patient was advised to call immediately if he has any concerning symptoms in the interval. The patient voices understanding of current disease status and treatment options and is in agreement with the current care plan.  All questions were answered. The patient knows to call the clinic with any problems, questions or concerns. We can certainly see the patient much sooner if necessary.  The total time spent in the appointment was 30 minutes.  Disclaimer: This note was dictated with voice recognition software. Similar sounding words can inadvertently be transcribed and may not be corrected upon review.

## 2019-09-09 NOTE — Discharge Instructions (Addendum)
You were seen in the emergency department today for constipation.  Your blood work looks similar to prior labs you have had done.  Please continue to take MiraLAX as needed per over-the-counter dosing.  We are also sending home with Colace to take 1 tablet every 12 hours as needed for constipation as well.   We have prescribed you new medication(s) today. Discuss the medications prescribed today with your pharmacist as they can have adverse effects and interactions with your other medicines including over the counter and prescribed medications. Seek medical evaluation if you start to experience new or abnormal symptoms after taking one of these medicines, seek care immediately if you start to experience difficulty breathing, feeling of your throat closing, facial swelling, or rash as these could be indications of a more serious allergic reaction  Please follow attached diet guidelines.  Please follow-up with your primary care provider within 3 days for reevaluation.  Return to the ER for new or worsening symptoms including but not limited to inability to have a bowel movement, abdominal pain, vomiting, fever, blood in your stool, or any other concerns.

## 2019-09-10 ENCOUNTER — Other Ambulatory Visit: Payer: Self-pay

## 2019-09-10 ENCOUNTER — Ambulatory Visit
Admission: RE | Admit: 2019-09-10 | Discharge: 2019-09-10 | Disposition: A | Discharge status: Home / Self Care | Payer: Medicare HMO | Source: Ambulatory Visit | Attending: Radiation Oncology | Admitting: Radiation Oncology

## 2019-09-10 DIAGNOSIS — Z510 Encounter for antineoplastic radiation therapy: Secondary | ICD-10-CM | POA: Diagnosis not present

## 2019-09-10 DIAGNOSIS — C3432 Malignant neoplasm of lower lobe, left bronchus or lung: Secondary | ICD-10-CM | POA: Diagnosis not present

## 2019-09-11 ENCOUNTER — Ambulatory Visit
Admission: RE | Admit: 2019-09-11 | Discharge: 2019-09-11 | Disposition: A | Discharge status: Home / Self Care | Payer: Medicare HMO | Source: Ambulatory Visit | Attending: Radiation Oncology | Admitting: Radiation Oncology

## 2019-09-11 ENCOUNTER — Other Ambulatory Visit: Payer: Self-pay

## 2019-09-11 ENCOUNTER — Telehealth: Payer: Self-pay

## 2019-09-11 DIAGNOSIS — C3432 Malignant neoplasm of lower lobe, left bronchus or lung: Secondary | ICD-10-CM | POA: Diagnosis not present

## 2019-09-11 DIAGNOSIS — Z510 Encounter for antineoplastic radiation therapy: Secondary | ICD-10-CM | POA: Diagnosis not present

## 2019-09-11 NOTE — Telephone Encounter (Signed)
Pt wife advised she would call back to set something up due to pt appointment schedule. [place]

## 2019-09-12 ENCOUNTER — Ambulatory Visit
Admission: RE | Admit: 2019-09-12 | Discharge: 2019-09-12 | Disposition: A | Discharge status: Home / Self Care | Payer: Medicare HMO | Source: Ambulatory Visit | Attending: Radiation Oncology | Admitting: Radiation Oncology

## 2019-09-12 DIAGNOSIS — C3432 Malignant neoplasm of lower lobe, left bronchus or lung: Secondary | ICD-10-CM | POA: Diagnosis not present

## 2019-09-12 DIAGNOSIS — Z510 Encounter for antineoplastic radiation therapy: Secondary | ICD-10-CM | POA: Diagnosis not present

## 2019-09-13 ENCOUNTER — Encounter (HOSPITAL_COMMUNITY): Payer: Self-pay

## 2019-09-13 ENCOUNTER — Other Ambulatory Visit: Payer: Medicare HMO

## 2019-09-13 ENCOUNTER — Encounter (HOSPITAL_COMMUNITY)
Admission: RE | Admit: 2019-09-13 | Discharge: 2019-09-13 | Disposition: A | Discharge status: Home / Self Care | Payer: Medicare HMO | Source: Ambulatory Visit | Attending: Internal Medicine | Admitting: Internal Medicine

## 2019-09-13 ENCOUNTER — Ambulatory Visit
Admission: RE | Admit: 2019-09-13 | Discharge: 2019-09-13 | Disposition: A | Discharge status: Home / Self Care | Payer: Medicare HMO | Source: Ambulatory Visit | Attending: Radiation Oncology | Admitting: Radiation Oncology

## 2019-09-13 ENCOUNTER — Encounter: Payer: Self-pay | Admitting: Internal Medicine

## 2019-09-13 ENCOUNTER — Other Ambulatory Visit: Payer: Self-pay

## 2019-09-13 DIAGNOSIS — R590 Localized enlarged lymph nodes: Secondary | ICD-10-CM | POA: Diagnosis not present

## 2019-09-13 DIAGNOSIS — C3490 Malignant neoplasm of unspecified part of unspecified bronchus or lung: Secondary | ICD-10-CM | POA: Diagnosis present

## 2019-09-13 DIAGNOSIS — R918 Other nonspecific abnormal finding of lung field: Secondary | ICD-10-CM | POA: Diagnosis not present

## 2019-09-13 DIAGNOSIS — Z85118 Personal history of other malignant neoplasm of bronchus and lung: Secondary | ICD-10-CM | POA: Diagnosis not present

## 2019-09-13 DIAGNOSIS — K6289 Other specified diseases of anus and rectum: Secondary | ICD-10-CM | POA: Diagnosis not present

## 2019-09-13 DIAGNOSIS — Z510 Encounter for antineoplastic radiation therapy: Secondary | ICD-10-CM | POA: Diagnosis not present

## 2019-09-13 DIAGNOSIS — C3432 Malignant neoplasm of lower lobe, left bronchus or lung: Secondary | ICD-10-CM | POA: Diagnosis not present

## 2019-09-13 LAB — GLUCOSE, CAPILLARY: Glucose-Capillary: 132 mg/dL — ABNORMAL HIGH (ref 70–99)

## 2019-09-13 MED ORDER — FLUDEOXYGLUCOSE F - 18 (FDG) INJECTION
7.9000 | Freq: Once | INTRAVENOUS | Status: AC | PRN
  Administered 2019-09-13: 7.9 via INTRAVENOUS

## 2019-09-15 ENCOUNTER — Encounter: Payer: Self-pay | Admitting: Internal Medicine

## 2019-09-16 ENCOUNTER — Other Ambulatory Visit: Payer: Self-pay

## 2019-09-16 ENCOUNTER — Telehealth: Payer: Self-pay | Admitting: Medical

## 2019-09-16 ENCOUNTER — Other Ambulatory Visit: Payer: Self-pay | Admitting: Medical Oncology

## 2019-09-16 ENCOUNTER — Ambulatory Visit
Admission: RE | Admit: 2019-09-16 | Discharge: 2019-09-16 | Disposition: A | Discharge status: Home / Self Care | Payer: Medicare HMO | Source: Ambulatory Visit | Attending: Radiation Oncology | Admitting: Radiation Oncology

## 2019-09-16 DIAGNOSIS — Z510 Encounter for antineoplastic radiation therapy: Secondary | ICD-10-CM | POA: Diagnosis not present

## 2019-09-16 DIAGNOSIS — C3432 Malignant neoplasm of lower lobe, left bronchus or lung: Secondary | ICD-10-CM | POA: Diagnosis not present

## 2019-09-16 NOTE — Telephone Encounter (Signed)
Scheduled per 8/9 sch msg. Sent msg for 2 units to chg pool. Called and confirmed 8/10 appts

## 2019-09-17 ENCOUNTER — Ambulatory Visit
Admission: RE | Admit: 2019-09-17 | Discharge: 2019-09-17 | Disposition: A | Discharge status: Home / Self Care | Payer: Medicare HMO | Source: Ambulatory Visit | Attending: Radiation Oncology | Admitting: Radiation Oncology

## 2019-09-17 ENCOUNTER — Inpatient Hospital Stay: Payer: Medicare HMO

## 2019-09-17 ENCOUNTER — Other Ambulatory Visit: Payer: Self-pay | Admitting: Medical Oncology

## 2019-09-17 ENCOUNTER — Inpatient Hospital Stay (HOSPITAL_BASED_OUTPATIENT_CLINIC_OR_DEPARTMENT_OTHER): Payer: Medicare HMO | Admitting: Medical

## 2019-09-17 ENCOUNTER — Other Ambulatory Visit: Payer: Self-pay

## 2019-09-17 ENCOUNTER — Telehealth: Payer: Self-pay

## 2019-09-17 ENCOUNTER — Encounter: Payer: Self-pay | Admitting: Medical

## 2019-09-17 VITALS — BP 130/71 | HR 96 | Temp 97.5°F | Resp 17 | Ht 67.0 in | Wt 152.4 lb

## 2019-09-17 DIAGNOSIS — Z510 Encounter for antineoplastic radiation therapy: Secondary | ICD-10-CM | POA: Diagnosis not present

## 2019-09-17 DIAGNOSIS — R531 Weakness: Secondary | ICD-10-CM | POA: Diagnosis not present

## 2019-09-17 DIAGNOSIS — C3432 Malignant neoplasm of lower lobe, left bronchus or lung: Secondary | ICD-10-CM

## 2019-09-17 DIAGNOSIS — R630 Anorexia: Secondary | ICD-10-CM

## 2019-09-17 DIAGNOSIS — R05 Cough: Secondary | ICD-10-CM | POA: Diagnosis not present

## 2019-09-17 DIAGNOSIS — Z5112 Encounter for antineoplastic immunotherapy: Secondary | ICD-10-CM | POA: Diagnosis not present

## 2019-09-17 DIAGNOSIS — Z95828 Presence of other vascular implants and grafts: Secondary | ICD-10-CM

## 2019-09-17 DIAGNOSIS — R059 Cough, unspecified: Secondary | ICD-10-CM

## 2019-09-17 LAB — CBC WITH DIFFERENTIAL (CANCER CENTER ONLY)
Abs Immature Granulocytes: 0.02 10*3/uL (ref 0.00–0.07)
Basophils Absolute: 0 10*3/uL (ref 0.0–0.1)
Basophils Relative: 1 %
Eosinophils Absolute: 0.2 10*3/uL (ref 0.0–0.5)
Eosinophils Relative: 6 %
HCT: 25.3 % — ABNORMAL LOW (ref 39.0–52.0)
Hemoglobin: 8.5 g/dL — ABNORMAL LOW (ref 13.0–17.0)
Immature Granulocytes: 1 %
Lymphocytes Relative: 21 %
Lymphs Abs: 0.7 10*3/uL (ref 0.7–4.0)
MCH: 28.8 pg (ref 26.0–34.0)
MCHC: 33.6 g/dL (ref 30.0–36.0)
MCV: 85.8 fL (ref 80.0–100.0)
Monocytes Absolute: 0.5 10*3/uL (ref 0.1–1.0)
Monocytes Relative: 16 %
Neutro Abs: 1.8 10*3/uL (ref 1.7–7.7)
Neutrophils Relative %: 55 %
Platelet Count: 395 10*3/uL (ref 150–400)
RBC: 2.95 MIL/uL — ABNORMAL LOW (ref 4.22–5.81)
RDW: 12.1 % (ref 11.5–15.5)
WBC Count: 3.2 10*3/uL — ABNORMAL LOW (ref 4.0–10.5)
nRBC: 0 % (ref 0.0–0.2)

## 2019-09-17 LAB — CMP (CANCER CENTER ONLY)
ALT: 13 U/L (ref 0–44)
AST: 19 U/L (ref 15–41)
Albumin: 2.9 g/dL — ABNORMAL LOW (ref 3.5–5.0)
Alkaline Phosphatase: 62 U/L (ref 38–126)
Anion gap: 8 (ref 5–15)
BUN: 22 mg/dL (ref 8–23)
CO2: 26 mmol/L (ref 22–32)
Calcium: 9.8 mg/dL (ref 8.9–10.3)
Chloride: 96 mmol/L — ABNORMAL LOW (ref 98–111)
Creatinine: 1.17 mg/dL (ref 0.61–1.24)
GFR, Est AFR Am: >60 mL/min (ref >60)
GFR, Estimated: 59 mL/min — ABNORMAL LOW (ref >60)
Glucose, Bld: 124 mg/dL — ABNORMAL HIGH (ref 70–99)
Potassium: 4.9 mmol/L (ref 3.5–5.1)
Sodium: 130 mmol/L — ABNORMAL LOW (ref 135–145)
Total Bilirubin: 0.4 mg/dL (ref 0.3–1.2)
Total Protein: 8 g/dL (ref 6.5–8.1)

## 2019-09-17 LAB — SAMPLE TO BLOOD BANK

## 2019-09-17 MED ORDER — SODIUM CHLORIDE 0.9% FLUSH
10.0000 mL | Freq: Once | INTRAVENOUS | Status: AC
  Administered 2019-09-17: 10 mL
  Filled 2019-09-17: qty 10

## 2019-09-17 MED ORDER — FERROUS SULFATE 325 (65 FE) MG PO TBEC: 325.0000 mg | DELAYED_RELEASE_TABLET | Freq: Every day | ORAL | 12 refills | Status: DC

## 2019-09-17 MED ORDER — HEPARIN SOD (PORK) LOCK FLUSH 100 UNIT/ML IV SOLN
500.0000 [IU] | Freq: Once | INTRAVENOUS | Status: AC
  Administered 2019-09-17: 500 [IU]
  Filled 2019-09-17: qty 5

## 2019-09-17 MED ORDER — GUAIFENESIN-CODEINE 100-10 MG/5ML PO SYRP: 5.0000 mL | ORAL_SOLUTION | Freq: Three times a day (TID) | ORAL | 0 refills | Status: DC | PRN

## 2019-09-17 NOTE — Patient Instructions (Signed)
Constipation Management  Magnesium Citrate, drink 1/2 bottle, drink remainder if no bowel movement with 30 to 60 minutes  Or  30 mg (1 tablespoon) of Milk of Magnesia in 8 ounces of prune juice, warm in microwave for 20 seconds    Begin the following after you have had a bowel movement:  Senna-S, 1 to 2 tablets twice daily  MiraLAX 17 grams in 8 ounces of liquids 1 to 2 times daily as needed   Remember to remain well hydrated. Drink, Drink, Drink non-caffeinated beverages.   Adjust these medications based on your response. If your bowel movements become too loose then decrease the amount of Senna-S and/or MiraLAX that you are using. If your bowel movements become too firm or are difficult to pass, the increase the amount of Senna-S and/or MiraLAX that you are using and increase your intake of water.   NEVER, NEVER, NEVER use an enema or suppositories unless your provider has given their approval.

## 2019-09-17 NOTE — Telephone Encounter (Signed)
PROGRESS NOTE: Pt's daughter sent non-urgent message this morning stating that pt is not eating again but will drink so they are going to try giving pt smoothies and ensure. She also wanted to know what else can be done. Since pt is coming in to be seen today in [facility] @1  I printed off question and gave it to [place] to address with pt and daughter before patient leaves today.

## 2019-09-18 ENCOUNTER — Inpatient Hospital Stay: Payer: Medicare HMO

## 2019-09-18 ENCOUNTER — Other Ambulatory Visit: Payer: Self-pay | Admitting: Medical Oncology

## 2019-09-18 ENCOUNTER — Ambulatory Visit
Admission: RE | Admit: 2019-09-18 | Discharge: 2019-09-18 | Disposition: A | Discharge status: Home / Self Care | Payer: Medicare HMO | Source: Ambulatory Visit | Attending: Radiation Oncology | Admitting: Radiation Oncology

## 2019-09-18 ENCOUNTER — Telehealth: Payer: Self-pay

## 2019-09-18 ENCOUNTER — Other Ambulatory Visit: Payer: Self-pay

## 2019-09-18 ENCOUNTER — Other Ambulatory Visit (INDEPENDENT_AMBULATORY_CARE_PROVIDER_SITE_OTHER): Payer: Medicare HMO

## 2019-09-18 DIAGNOSIS — C3432 Malignant neoplasm of lower lobe, left bronchus or lung: Secondary | ICD-10-CM

## 2019-09-18 DIAGNOSIS — R350 Frequency of micturition: Secondary | ICD-10-CM | POA: Diagnosis not present

## 2019-09-18 DIAGNOSIS — Z510 Encounter for antineoplastic radiation therapy: Secondary | ICD-10-CM | POA: Diagnosis not present

## 2019-09-18 LAB — POCT URINALYSIS DIP (PROADVANTAGE DEVICE)
Bilirubin, UA: NEGATIVE
Blood, UA: NEGATIVE
Glucose, UA: NEGATIVE mg/dL
Ketones, POC UA: NEGATIVE mg/dL
Leukocytes, UA: NEGATIVE
Nitrite, UA: NEGATIVE
Protein Ur, POC: NEGATIVE mg/dL
Specific Gravity, Urine: 1.01
Urobilinogen, Ur: 0.2
pH, UA: 6.5 (ref 5.0–8.0)

## 2019-09-18 NOTE — Progress Notes (Signed)
Sympt[facility] Management Clinic Progress Note   Shaun Kennedy [id number] 1940/11/24 79 y.o.  Shaun Kennedy is managed by Dr. Fanny Bien. Mohamed  Actively treated with chemotherapy/immunotherapy/hormonal therapy: yes  Current therapy: carboplatin, paclitaxel, nivolumaband ipilimumab  Last treated: 09/04/2019 (cycle #1, day #1)  Next scheduled appointment with provider: 09/25/2019  Assessment: Plan:    Port-A-Cath in place - Plan: heparin lock flush 100 unit/mL, sodium chloride flush (NS) 0.9 % injection 10 mL  Malignant neoplasm of lower lobe of left lung (HCC)  Decreased appetite  Cough - Plan: guaiFENesin-codeine (ROBITUSSIN AC) 100-10 MG/5ML syrup  Weakness - Plan: ferrous sulfate 325 (65 FE) MG EC tablet   Stage IV (T3, N2, M1 a) non-small cell lung cancer, squamous cell carcinoma: [name] is followed by Dr. Julien Nordmann and is status post cycle #1, day #1 of carboplatin, paclitaxel, nivolumaband ipilimumab which was dosed on 09/04/2019. He will be seen in follow-up on 09/25/2019.  Anorexia: [name] was instructed to restart his Remeron 15 mg PO QHS.  Cough: [name] was given a prescription for Robitussin with codeine.   Weakness: [name] was given a prescription for ferrous sulfate 325 mg EC tablets once daily.  Please see After Visit Summary for patient specific instructions.  Future Appointments  Date Time Provider [place]  09/19/2019 11:45 AM [facility] LINAC 3 [facility] None  09/20/2019 11:15 AM [facility] [id number] [facility] None  09/25/2019  8:00 AM [facility] LAB ONLY [facility] None  09/25/2019  8:15 AM [facility] INFUSION [facility] None  09/25/2019  8:30 AM Curt Bears, MD [facility] None  09/25/2019  9:00 AM [facility] INFUSION [facility] None  09/25/2019 12:00 PM Jennet Maduro, RD [facility] None  10/02/2019 11:30 AM [facility] LAB ONLY [facility] None  10/02/2019 11:45 AM [facility] [place] FLUSH [facility] None  10/09/2019 11:30 AM [facility]  LAB ONLY [facility] None  10/09/2019 11:45 AM [facility] [place] FLUSH [facility] None  10/16/2019  8:30 AM [facility] LAB ONLY [facility] None  10/16/2019  8:45 AM [facility] [place] FLUSH [facility] None  10/16/2019  9:00 AM Curt Bears, MD [facility] None  10/16/2019 10:00 AM [facility] INFUSION [facility] None  10/25/2019  9:30 AM [name], MD [facility]  02/14/2020  9:15 AM [name], MD [facility]    No orders of the defined types were placed in this encounter.      Subjective:   Patient ID:  Shaun Kennedy is a 79 y.o. (DOB February 07, 1941) male.  Chief Complaint:  Chief Complaint  Patient presents with  . Anorexia    HPI FARAAZ Kennedy  is a 79 y.o. male with a diagnosis of a stage IV (T3, N2, M1 a) non-small cell lung cancer, squamous cell carcinoma. He is managed by Dr. Julien Nordmann and is status post cycle #1, day #1 of carboplatin, paclitaxel, nivolumaband ipilimumab which was dosed on 09/04/2019.  He presents to the clinic today with a report of anorexia, cough, and generalized fatigue.  He has only sleeping around 3 to 4 hours a night due to his ongoing cough.  He reports having weakness.  He had previously been given a prescription for Remeron 15 mg p.o. nightly by Dr. Julien Nordmann but has not been taking this medication.  He denies fevers, chills, or sweats.  He is scheduled to be seen next on 09/25/2019.  Medications: I have reviewed the patient's current medications.  Allergies: No Known Allergies  Past Medical History:  Diagnosis Date  . Allergic rhinitis   . Asthma   .  Carotid stenosis   . Colon cancer ([place]) 2003  . Diabetes mellitus ([place])   . Diverticulosis   . Dyslipidemia   . ED (erectile dysfunction)   . GERD (gastroesophageal reflux disease)   . H/O degenerative disc disease   . Hemorrhoids   . HTN (hypertension)   . Hyperlipidemia   . LVH (left ventricular hypertrophy)    on EKG  . Mass of lower lobe of left lung   . Mediastinal adenopathy   . Smoker     former  . Wears dentures   . Wears glasses     Past Surgical History:  Procedure Laterality Date  . BRONCHIAL BIOPSY  08/20/2019   Procedure: BRONCHIAL BIOPSIES;  Surgeon: Collene Gobble, MD;  Location: [facility] ENDOSCOPY;  Service: Pulmonary;;  . BRONCHIAL BRUSHINGS  08/20/2019   Procedure: BRONCHIAL BRUSHINGS;  Surgeon: Collene Gobble, MD;  Location: [facility] ENDOSCOPY;  Service: Pulmonary;;  . BRONCHIAL NEEDLE ASPIRATION BIOPSY  08/20/2019   Procedure: BRONCHIAL NEEDLE ASPIRATION BIOPSIES;  Surgeon: Collene Gobble, MD;  Location: [facility] ENDOSCOPY;  Service: Pulmonary;;  . CARPAL TUNNEL RELEASE Right 01/09/2019   Procedure: CARPAL TUNNEL RELEASE;  Surgeon: Leandrew Koyanagi, MD;  Location: [place];  Service: Orthopedics;  Laterality: Right;  . CATARACT EXTRACTION Right 2018  . COLONOSCOPY  2007   Dr. Benson [place]  . I & D EXTREMITY Right 04/02/2016   Procedure: IRRIGATION AND DEBRIDEMENT GREAT TOE;  Surgeon: Leandrew Koyanagi, MD;  Location: [place];  Service: Orthopedics;  Laterality: Right;  . IR IMAGING GUIDED PORT INSERTION  08/30/2019  . MULTIPLE TOOTH EXTRACTIONS    . RADIOACTIVE SEED IMPLANT  2003  . ULNAR TUNNEL RELEASE Right 01/09/2019   Procedure: RIGHT CUBITAL TUNNEL RELEASE AND CARPAL TUNNEL RELEASE;  Surgeon: Leandrew Koyanagi, MD;  Location: [place];  Service: Orthopedics;  Laterality: Right;  . UPPER GASTROINTESTINAL ENDOSCOPY    . VIDEO BRONCHOSCOPY WITH ENDOBRONCHIAL ULTRASOUND N/A 08/20/2019   Procedure: VIDEO BRONCHOSCOPY WITH ENDOBRONCHIAL ULTRASOUND;  Surgeon: Collene Gobble, MD;  Location[facility] ENDOSCOPY;  Service: Pulmonary;  Laterality: N/A;    Family History  Problem Relation Age of Onset  . Heart failure Mother   . Diabetes Mother   . Stroke Father   . Diabetes Father   . Diabetes Sister   . Diabetes Sister   . Lung cancer Sister 41  . Colon cancer Neg Hx   . Esophageal cancer Neg Hx   . Rectal cancer Neg Hx   . Colon polyps Neg Hx   . Stomach cancer Neg  Hx     Social History   Socioeconomic History  . Marital status: Married    Spouse name: Not on file  . Number of children: 3  . Years of education: Not on file  . Highest education level: Not on file  Occupational History  . Occupation: retired  Tobacco Use  . Smoking status: Former Smoker    Packs/day: 1.00    Years: 32.00    Pack years: 32.00    Start date: 02/08/1955    Quit date: 10/09/1987    Years since quitting: 31.9  . Smokeless tobacco: Never Used  Vaping Use  . Vaping Use: Never used  Substance and Sexual Activity  . Alcohol use: No  . Drug use: No  . Sexual activity: Yes  Other Topics Concern  . Not on file  Social History Narrative   Married, no pets   Social Determinants of  Health   Financial Resource Strain:   . Difficulty of Paying Living Expenses:   Food Insecurity:   . Worried About Charity fundraiser in the Last Year:   . Arboriculturist in the Last Year:   Transportation Needs:   . Film/video editor (Medical):   Marland Kitchen Lack of Transportation (Non-Medical):   Physical Activity:   . Days of Exercise per Week:   . Minutes of Exercise per Session:   Stress:   . Feeling of Stress :   Social Connections:   . Frequency of Communication with Friends and Family:   . Frequency of Social Gatherings with Friends and Family:   . Attends Religious Services:   . Active Member of Clubs or Organizations:   . Attends Archivist Meetings:   Marland Kitchen Marital Status:   Intimate Partner Violence:   . Fear of Current or Ex-Partner:   . Emotionally Abused:   Marland Kitchen Physically Abused:   . Sexually Abused:     Past Medical History, Surgical history, Social history, and Family history were reviewed and updated as appropriate.   Please see review of systems for further details on the patient's review from today.   Review of Systems:  Review of Systems  Constitutional: Positive for appetite change and fatigue. Negative for chills, diaphoresis, fever and  unexpected weight change.  HENT: Negative for trouble swallowing.   Respiratory: Positive for cough. Negative for chest tightness and shortness of breath.   Cardiovascular: Negative for chest pain and palpitations.  Gastrointestinal: Negative for abdominal pain, constipation, diarrhea, nausea and vomiting.  Neurological: Positive for weakness. Negative for dizziness and headaches.  Psychiatric/Behavioral: Positive for sleep disturbance.    Objective:   Physical Exam:  BP 130/71 (BP Location: Left Arm, Patient Position: Sitting)   Pulse 96   Temp (!) 97.5 F (36.4 C) (Tympanic)   Resp 17   Ht 5\' 7"  (1.702 m)   Wt 152 lb 6.4 oz (69.1 kg)   SpO2 100% Comment: RA  BMI 23.87 kg/m  ECOG: 1  Physical Exam Constitutional:      General: He is not in acute distress.    Appearance: He is not diaphoretic.  HENT:     Head: Normocephalic and atraumatic.     Right Ear: External ear normal.     Left Ear: External ear normal.  Eyes:     General: No scleral icterus.       Right eye: No discharge.        Left eye: No discharge.     Conjunctiva/sclera: Conjunctivae normal.  Cardiovascular:     Rate and Rhythm: Normal rate and regular rhythm.     Heart sounds: Normal heart sounds. No murmur heard.  No friction rub. No gallop.   Pulmonary:     Effort: Pulmonary effort is normal. No respiratory distress.     Breath sounds: Normal breath sounds. No wheezing or rales.  Musculoskeletal:     Cervical back: Normal range of motion and neck supple.  Lymphadenopathy:     Cervical: No cervical adenopathy.  Skin:    General: Skin is warm and dry.     Findings: No erythema or rash.  Neurological:     Mental Status: He is alert.     Coordination: Coordination normal.     Gait: Gait abnormal (The patient is ambulating with the use of a wheelchair.).  Psychiatric:        Behavior: Behavior normal.  Thought Content: Thought content normal.        Judgment: Judgment normal.     Lab  Review:     Component Value Date/Time   NA 130 (L) 09/17/2019 1256   NA 132 (L) 08/14/2019 0918   K 4.9 09/17/2019 1256   CL 96 (L) 09/17/2019 1256   CO2 26 09/17/2019 1256   GLUCOSE 124 (H) 09/17/2019 1256   BUN 22 09/17/2019 1256   BUN 16 08/14/2019 0918   CREATININE 1.17 09/17/2019 1256   CREATININE 1.25 (H) 08/08/2016 0912   CALCIUM 9.8 09/17/2019 1256   PROT 8.0 09/17/2019 1256   PROT 8.1 08/14/2019 0918   ALBUMIN 2.9 (L) 09/17/2019 1256   ALBUMIN 3.7 08/14/2019 0918   AST 19 09/17/2019 1256   ALT 13 09/17/2019 1256   ALKPHOS 62 09/17/2019 1256   BILITOT 0.4 09/17/2019 1256   GFRNONAA 59 (L) 09/17/2019 1256   GFRAA >60 09/17/2019 1256       Component Value Date/Time   WBC 3.2 (L) 09/17/2019 1256   WBC 6.8 09/09/2019 0253   RBC 2.95 (L) 09/17/2019 1256   HGB 8.5 (L) 09/17/2019 1256   HGB 9.8 (L) 08/14/2019 0918   HCT 25.3 (L) 09/17/2019 1256   HCT 30.1 (L) 08/14/2019 0918   PLT 395 09/17/2019 1256   PLT 549 (H) 08/14/2019 0918   MCV 85.8 09/17/2019 1256   MCV 87 08/14/2019 0918   MCH 28.8 09/17/2019 1256   MCHC 33.6 09/17/2019 1256   RDW 12.1 09/17/2019 1256   RDW 11.8 08/14/2019 0918   LYMPHSABS 0.7 09/17/2019 1256   LYMPHSABS 1.8 08/14/2019 0918   MONOABS 0.5 09/17/2019 1256   EOSABS 0.2 09/17/2019 1256   EOSABS 0.3 08/14/2019 0918   BASOSABS 0.0 09/17/2019 1256   BASOSABS 0.0 08/14/2019 0918   -------------------------------  Imaging from last 24 hours (if applicable):  Radiology interpretation: MR BRAIN W WO CONTRAST  Result Date: 09/06/2019 CLINICAL DATA:  Non-small cell lung cancer staging EXAM: MRI HEAD WITHOUT AND WITH CONTRAST TECHNIQUE: Multiplanar, multiecho pulse sequences of the brain and surrounding structures were obtained without and with intravenous contrast. CONTRAST:  54mL GADAVIST GADOBUTROL 1 MMOL/ML IV SOLN COMPARISON:  None. FINDINGS: Brain: No mass or swelling to suggest metastatic disease. Chronic small vessel ischemia in the  cerebral white matter which is extensive and confluent around the lateral ventricles. Chronic lacune at the left thalamus. Normal brain volume. No recent infarct, hemorrhage, or hydrocephalus. Vascular: Normal flow voids and vascular enhancement Skull and upper cervical spine: No evidence of bony metastasis. Sinuses/Orbits: No evidence of metastatic disease IMPRESSION: Negative for metastatic disease. Extensive chronic small vessel ischemia. Electronically Signed   By: Monte Fantasia M.D.   On: 09/06/2019 09:42   NM PET Image Initial (PI) Skull Base To Thigh  Result Date: 09/13/2019 CLINICAL DATA:  Initial treatment strategy for non-small cell lung cancer. EXAM: NUCLEAR MEDICINE PET SKULL BASE TO THIGH TECHNIQUE: 7.9 mCi F-18 FDG was injected intravenously. Full-ring PET imaging was performed from the skull base to thigh after the radiotracer. CT data was obtained and used for attenuation correction and anatomic localization. Fasting blood glucose: 132 mg/dl COMPARISON:  Chest CT 08/16/2019 FINDINGS: Mediastinal blood pool activity: SUV max 2.21 Liver activity: SUV max NA NECK: No neck mass or neck adenopathy. There is an 8 mm right supraclavicular node which is hypermetabolic with SUV max of 5.62. Incidental CT findings: none CHEST: The large partially necrotic left lower lobe lung mass is hypermetabolic with  SUV max of 11.89. Extensive hypermetabolic mediastinal lymphadenopathy involving the prevascular lymph nodes, AP window nodes and contralateral right paratracheal nodes. No definite subcarinal disease. The left paratracheal node measures 17 mm and the SUV max is 10.0 Extensive pulmonary a metastatic disease with numerous hypermetabolic pulmonary nodules throughout both lungs. Incidental CT findings: none ABDOMEN/PELVIS: No PET-CT findings suspicious for abdominal/pelvic metastatic disease. No hypermetabolic liver or adrenal gland lesions are identified. No abdominal/pelvic lymphadenopathy. There is fairly  significant circumferential FDG uptake in the rectum and there is moderate diffuse rectal wall thickening. SUV max is 12.1. Findings could be due to proctitis but could not exclude the possibility of tumor. Recommend correlation with rectal exam. Incidental CT findings: Extensive brachytherapy seeds are noted in the prostate gland suggesting prior prostate cancer. No pelvic or retroperitoneal adenopathy. SKELETON: No hypermetabolic bone lesions to suggest metastatic disease. Incidental CT findings: none IMPRESSION: 1. Large partially necrotic left lower lobe mass is hypermetabolic and consistent with known lung neoplasm. 2. Extensive mediastinal and hilar metastatic adenopathy and extensive pulmonary metastatic nodules. 3. Hypermetabolic right supraclavicular lymph nodes. 4. No findings for metastatic disease involving the abdomen/pelvis or bony structures. 5. Hypermetabolism in a diffusely thickened rectal wall. Could not exclude rectal neoplasm. Electronically Signed   By: Marijo Sanes M.D.   On: 09/13/2019 16:30   DG CHEST PORT 1 VIEW  Result Date: 08/20/2019 CLINICAL DATA:  Status post bronchoscopy EXAM: PORTABLE CHEST 1 VIEW COMPARISON:  August 16, 2019 chest radiograph and chest CT FINDINGS: No appreciable pneumothorax. There is again noted a mass overlying the left hilum measuring 5.5 x 4.9 cm. Lungs elsewhere clear. Heart is upper normal in size with pulmonary vascularity normal. No adenopathy. No bone lesions. IMPRESSION: No pneumothorax. Masslike area overlying the left hilum seen on CT to reside in the superior segment left lower lobe region. Lungs otherwise clear. Stable cardiac silhouette. Electronically Signed   By: Lowella Grip III M.D.   On: 08/20/2019 12:03   DG Abd Acute W/Chest  Result Date: 09/09/2019 CLINICAL DATA:  Constipation for 5 days EXAM: DG ABDOMEN ACUTE W/ 1V CHEST COMPARISON:  CT 08/16/2019 FINDINGS: Power port type central venous catheter with tip over the low SVC region. No  pneumothorax. Heart size and pulmonary vascularity are normal. Left hilar/perihilar mass is better demonstrated on previous CT chest. No pleural effusion or consolidation. Additional nodules in the left lung apex. Gas and stool throughout the colon. No small or large bowel distention. No free intra-abdominal air. No abnormal air-fluid levels. No radiopaque stones. Radiopaque densities in the pelvis likely represent prostate seed implants. Vascular calcifications. Degenerative changes in the spine and hips. IMPRESSION: 1. Nonobstructive bowel gas pattern with stool-filled colon. 2. Left hilar/perihilar mass with additional nodules in the left lung apex. Electronically Signed   By: Lucienne Capers M.D.   On: 09/09/2019 04:04   IR IMAGING GUIDED PORT INSERTION  Result Date: 08/30/2019 INDICATION: 79 year old male referred for port catheter placement EXAM: IMAGE GUIDED PORT CATHETER PLACEMENT MEDICATIONS: 2 g Ancef; The antibiotic was administered within an appropriate time interval prior to skin puncture. ANESTHESIA/SEDATION: Moderate (conscious) sedation was employed during this procedure. A total of Versed 1.5 mg and Fentanyl 75 mcg was administered intravenously. Moderate Sedation Time: 14 minutes. The patient's level of consciousness and vital signs were monitored continuously by radiology nursing throughout the procedure under my direct supervision. FLUOROSCOPY TIME:  Fluoroscopy Time: 0 minutes 12 seconds (2 mGy). COMPLICATIONS: None PROCEDURE: The procedure, risks, benefits, and alternatives were  explained to the patient. Questions regarding the procedure were encouraged and answered. The patient understands and consents to the procedure. Ultrasound survey was performed with images stored and sent to PACs. The right neck and chest was prepped with chlorhexidine, and draped in the usual sterile fashion using maximum barrier technique (cap and mask, sterile gown, sterile gloves, large sterile sheet, hand  hygiene and cutaneous antiseptic). Antibiotic prophylaxis was provided with 2.0g Ancef administered IV one hour prior to skin incision. Local anesthesia was attained by infiltration with 1% lidocaine without epinephrine. Ultrasound demonstrated patency of the right internal jugular vein, and this was documented with an image. Under real-time ultrasound guidance, this vein was accessed with a 21 gauge micropuncture needle and image documentation was performed. A small dermatotomy was made at the access site with an 11 scalpel. A 0.018" wire was advanced into the SVC and used to estimate the length of the internal catheter. The access needle exchanged for a 66F micropuncture vascular sheath. The 0.018" wire was then removed and a 0.035" wire advanced into the IVC. An appropriate location for the subcutaneous reservoir was selected below the clavicle and an incision was made through the skin and underlying soft tissues. The subcutaneous tissues were then dissected using a combination of blunt and sharp surgical technique and a pocket was formed. A single lumen power injectable portacatheter was then tunneled through the subcutaneous tissues from the pocket to the dermatotomy and the port reservoir placed within the subcutaneous pocket. The venous access site was then serially dilated and a peel away vascular sheath placed over the wire. The wire was removed and the port catheter advanced into position under fluoroscopic guidance. The catheter tip is positioned in the cavoatrial junction. This was documented with a spot image. The portacatheter was then tested and found to flush and aspirate well. The port was flushed with saline followed by 100 units/mL heparinized saline. The pocket was then closed in two layers using first subdermal inverted interrupted absorbable sutures followed by a running subcuticular suture. The epidermis was then sealed with Dermabond. The dermatotomy at the venous access site was also seal  with Dermabond. Patient tolerated the procedure well and remained hemodynamically stable throughout. No complications encountered and no significant blood loss encountered IMPRESSION: Status post right IJ port catheter placement. Signed, Dulcy Fanny. Dellia Nims, RPVI [facility] Electronically Signed   By: Corrie Mckusick D.O.   On: 08/30/2019 14:49        This case was discussed with Dr. Julien Nordmann. He expressed agreement with my management of this patient.

## 2019-09-18 NOTE — Telephone Encounter (Signed)
Pt u/a was brought back today. Please advise [facility]

## 2019-09-18 NOTE — Telephone Encounter (Signed)
Done Noberto Retort

## 2019-09-18 NOTE — Telephone Encounter (Signed)
Virtual visit 

## 2019-09-19 ENCOUNTER — Other Ambulatory Visit: Payer: Self-pay

## 2019-09-19 ENCOUNTER — Ambulatory Visit
Admission: RE | Admit: 2019-09-19 | Discharge: 2019-09-19 | Disposition: A | Discharge status: Home / Self Care | Payer: Medicare HMO | Source: Ambulatory Visit | Attending: Radiation Oncology | Admitting: Radiation Oncology

## 2019-09-19 DIAGNOSIS — Z510 Encounter for antineoplastic radiation therapy: Secondary | ICD-10-CM | POA: Diagnosis not present

## 2019-09-19 DIAGNOSIS — J45909 Unspecified asthma, uncomplicated: Secondary | ICD-10-CM | POA: Diagnosis not present

## 2019-09-19 DIAGNOSIS — C3432 Malignant neoplasm of lower lobe, left bronchus or lung: Secondary | ICD-10-CM | POA: Diagnosis not present

## 2019-09-19 DIAGNOSIS — E782 Mixed hyperlipidemia: Secondary | ICD-10-CM | POA: Diagnosis not present

## 2019-09-19 DIAGNOSIS — K219 Gastro-esophageal reflux disease without esophagitis: Secondary | ICD-10-CM | POA: Diagnosis not present

## 2019-09-19 DIAGNOSIS — I6529 Occlusion and stenosis of unspecified carotid artery: Secondary | ICD-10-CM | POA: Diagnosis not present

## 2019-09-19 DIAGNOSIS — R590 Localized enlarged lymph nodes: Secondary | ICD-10-CM | POA: Diagnosis not present

## 2019-09-19 DIAGNOSIS — I119 Hypertensive heart disease without heart failure: Secondary | ICD-10-CM | POA: Diagnosis not present

## 2019-09-19 DIAGNOSIS — E119 Type 2 diabetes mellitus without complications: Secondary | ICD-10-CM | POA: Diagnosis not present

## 2019-09-19 DIAGNOSIS — R630 Anorexia: Secondary | ICD-10-CM | POA: Diagnosis not present

## 2019-09-19 DIAGNOSIS — I700 Atherosclerosis of aorta: Secondary | ICD-10-CM | POA: Diagnosis not present

## 2019-09-20 ENCOUNTER — Other Ambulatory Visit: Payer: Self-pay

## 2019-09-20 ENCOUNTER — Encounter: Payer: Self-pay | Admitting: Radiation Oncology

## 2019-09-20 ENCOUNTER — Ambulatory Visit
Admission: RE | Admit: 2019-09-20 | Discharge: 2019-09-20 | Disposition: A | Discharge status: Home / Self Care | Payer: Medicare HMO | Source: Ambulatory Visit | Attending: Radiation Oncology | Admitting: Radiation Oncology

## 2019-09-20 DIAGNOSIS — Z510 Encounter for antineoplastic radiation therapy: Secondary | ICD-10-CM | POA: Diagnosis not present

## 2019-09-20 DIAGNOSIS — C3432 Malignant neoplasm of lower lobe, left bronchus or lung: Secondary | ICD-10-CM | POA: Diagnosis not present

## 2019-09-23 ENCOUNTER — Telehealth (INDEPENDENT_AMBULATORY_CARE_PROVIDER_SITE_OTHER): Payer: Medicare HMO | Admitting: Family Medicine

## 2019-09-23 ENCOUNTER — Other Ambulatory Visit: Payer: Self-pay

## 2019-09-23 ENCOUNTER — Encounter: Payer: Self-pay | Admitting: Family Medicine

## 2019-09-23 VITALS — BP 130/71 | Wt 152.0 lb

## 2019-09-23 DIAGNOSIS — C3432 Malignant neoplasm of lower lobe, left bronchus or lung: Secondary | ICD-10-CM

## 2019-09-23 DIAGNOSIS — N3281 Overactive bladder: Secondary | ICD-10-CM | POA: Diagnosis not present

## 2019-09-23 DIAGNOSIS — I119 Hypertensive heart disease without heart failure: Secondary | ICD-10-CM | POA: Diagnosis not present

## 2019-09-23 DIAGNOSIS — E119 Type 2 diabetes mellitus without complications: Secondary | ICD-10-CM | POA: Diagnosis not present

## 2019-09-23 DIAGNOSIS — I700 Atherosclerosis of aorta: Secondary | ICD-10-CM | POA: Diagnosis not present

## 2019-09-23 DIAGNOSIS — R630 Anorexia: Secondary | ICD-10-CM

## 2019-09-23 DIAGNOSIS — E782 Mixed hyperlipidemia: Secondary | ICD-10-CM | POA: Diagnosis not present

## 2019-09-23 DIAGNOSIS — R590 Localized enlarged lymph nodes: Secondary | ICD-10-CM | POA: Diagnosis not present

## 2019-09-23 DIAGNOSIS — J45909 Unspecified asthma, uncomplicated: Secondary | ICD-10-CM | POA: Diagnosis not present

## 2019-09-23 DIAGNOSIS — I6529 Occlusion and stenosis of unspecified carotid artery: Secondary | ICD-10-CM | POA: Diagnosis not present

## 2019-09-23 DIAGNOSIS — K219 Gastro-esophageal reflux disease without esophagitis: Secondary | ICD-10-CM | POA: Diagnosis not present

## 2019-09-23 MED ORDER — MIRABEGRON ER 50 MG PO TB24: 50.0000 mg | ORAL_TABLET | Freq: Every day | ORAL | 0 refills | Status: DC

## 2019-09-23 NOTE — Progress Notes (Signed)
   Subjective:    Patient ID: Shaun Kennedy, male    DOB: 1940-03-03, 79 y.o.   MRN: [id number]  HPI I connected with  Shaun Kennedy on 09/23/19 by a video enabled telemedicine application and verified that I am speaking with the correct person using two identifiers.  He and his wife are on the phone together at home, I am in the office.  They did not have a computer. I discussed the limitations of evaluation and management by telemedicine. The patient expressed understanding and agreed to proceed. He continues to have difficulty with urinary frequency.  He does have underlying OAB as well as diabetes.  He did bring in a urine specimen which was clear.  He has not been taking his Myrbetriq.  He was on 25 mg dosing but apparently that was not working well.  He also has had some difficulty with decreased appetite.  His wife is feeding him smoothies which she seems to do well with.    Review of Systems     Objective:   Physical Exam Alert and in no distress otherwise not examined       Assessment & Plan:  OAB (overactive bladder) - Plan: mirabegron ER (MYRBETRIQ) 50 MG TB24 tablet  Type 2 diabetes mellitus without complication, without long-term current use of insulin (HCC)  Malignant neoplasm of lower lobe of left lung (HCC)  Decreased appetite Samples of Myrbetriq given.  They are to call in 2 weeks to let me know how he is doing. I encouraged him to continue to feed him smoothies and also put in some protein in the form of peanut butter or other kinds of knots.  They were comfortable with that.

## 2019-09-24 DIAGNOSIS — I119 Hypertensive heart disease without heart failure: Secondary | ICD-10-CM | POA: Diagnosis not present

## 2019-09-24 DIAGNOSIS — I700 Atherosclerosis of aorta: Secondary | ICD-10-CM | POA: Diagnosis not present

## 2019-09-24 DIAGNOSIS — E119 Type 2 diabetes mellitus without complications: Secondary | ICD-10-CM | POA: Diagnosis not present

## 2019-09-24 DIAGNOSIS — J45909 Unspecified asthma, uncomplicated: Secondary | ICD-10-CM | POA: Diagnosis not present

## 2019-09-24 DIAGNOSIS — E782 Mixed hyperlipidemia: Secondary | ICD-10-CM | POA: Diagnosis not present

## 2019-09-24 DIAGNOSIS — I6529 Occlusion and stenosis of unspecified carotid artery: Secondary | ICD-10-CM | POA: Diagnosis not present

## 2019-09-24 DIAGNOSIS — R630 Anorexia: Secondary | ICD-10-CM | POA: Diagnosis not present

## 2019-09-24 DIAGNOSIS — C3432 Malignant neoplasm of lower lobe, left bronchus or lung: Secondary | ICD-10-CM | POA: Diagnosis not present

## 2019-09-24 DIAGNOSIS — R590 Localized enlarged lymph nodes: Secondary | ICD-10-CM | POA: Diagnosis not present

## 2019-09-24 DIAGNOSIS — K219 Gastro-esophageal reflux disease without esophagitis: Secondary | ICD-10-CM | POA: Diagnosis not present

## 2019-09-25 ENCOUNTER — Inpatient Hospital Stay: Payer: Medicare HMO | Admitting: Internal Medicine

## 2019-09-25 ENCOUNTER — Inpatient Hospital Stay: Payer: Medicare HMO

## 2019-09-25 ENCOUNTER — Other Ambulatory Visit: Payer: Self-pay

## 2019-09-25 ENCOUNTER — Encounter: Payer: Self-pay | Admitting: Internal Medicine

## 2019-09-25 VITALS — BP 118/54 | HR 96 | Temp 97.4°F | Resp 17 | Ht 67.0 in | Wt 148.8 lb

## 2019-09-25 DIAGNOSIS — R630 Anorexia: Secondary | ICD-10-CM | POA: Diagnosis not present

## 2019-09-25 DIAGNOSIS — C3432 Malignant neoplasm of lower lobe, left bronchus or lung: Secondary | ICD-10-CM

## 2019-09-25 DIAGNOSIS — Z5112 Encounter for antineoplastic immunotherapy: Secondary | ICD-10-CM | POA: Diagnosis not present

## 2019-09-25 DIAGNOSIS — R1310 Dysphagia, unspecified: Secondary | ICD-10-CM

## 2019-09-25 DIAGNOSIS — Z5111 Encounter for antineoplastic chemotherapy: Secondary | ICD-10-CM | POA: Diagnosis not present

## 2019-09-25 LAB — CMP (CANCER CENTER ONLY)
ALT: 9 U/L (ref 0–44)
AST: 13 U/L — ABNORMAL LOW (ref 15–41)
Albumin: 3.1 g/dL — ABNORMAL LOW (ref 3.5–5.0)
Alkaline Phosphatase: 63 U/L (ref 38–126)
Anion gap: 8 (ref 5–15)
BUN: 18 mg/dL (ref 8–23)
CO2: 26 mmol/L (ref 22–32)
Calcium: 9.9 mg/dL (ref 8.9–10.3)
Chloride: 98 mmol/L (ref 98–111)
Creatinine: 1.06 mg/dL (ref 0.61–1.24)
GFR, Est AFR Am: >60 mL/min (ref >60)
GFR, Estimated: >60 mL/min (ref >60)
Glucose, Bld: 118 mg/dL — ABNORMAL HIGH (ref 70–99)
Potassium: 4.5 mmol/L (ref 3.5–5.1)
Sodium: 132 mmol/L — ABNORMAL LOW (ref 135–145)
Total Bilirubin: 0.4 mg/dL (ref 0.3–1.2)
Total Protein: 7.7 g/dL (ref 6.5–8.1)

## 2019-09-25 LAB — CBC WITH DIFFERENTIAL (CANCER CENTER ONLY)
Abs Immature Granulocytes: 0.01 10*3/uL (ref 0.00–0.07)
Basophils Absolute: 0 10*3/uL (ref 0.0–0.1)
Basophils Relative: 1 %
Eosinophils Absolute: 0.2 10*3/uL (ref 0.0–0.5)
Eosinophils Relative: 6 %
HCT: 26.3 % — ABNORMAL LOW (ref 39.0–52.0)
Hemoglobin: 8.5 g/dL — ABNORMAL LOW (ref 13.0–17.0)
Immature Granulocytes: 0 %
Lymphocytes Relative: 16 %
Lymphs Abs: 0.6 10*3/uL — ABNORMAL LOW (ref 0.7–4.0)
MCH: 28.1 pg (ref 26.0–34.0)
MCHC: 32.3 g/dL (ref 30.0–36.0)
MCV: 86.8 fL (ref 80.0–100.0)
Monocytes Absolute: 0.4 10*3/uL (ref 0.1–1.0)
Monocytes Relative: 12 %
Neutro Abs: 2.3 10*3/uL (ref 1.7–7.7)
Neutrophils Relative %: 65 %
Platelet Count: 375 10*3/uL (ref 150–400)
RBC: 3.03 MIL/uL — ABNORMAL LOW (ref 4.22–5.81)
RDW: 12.6 % (ref 11.5–15.5)
WBC Count: 3.5 10*3/uL — ABNORMAL LOW (ref 4.0–10.5)
nRBC: 0 % (ref 0.0–0.2)

## 2019-09-25 LAB — TSH: TSH: 1.071 u[IU]/mL (ref 0.320–4.118)

## 2019-09-25 MED ORDER — SUCRALFATE 1 GM/10ML PO SUSP
1.0000 g | Freq: Three times a day (TID) | ORAL | 0 refills | Status: DC
Start: 2019-09-25 — End: 2019-11-18

## 2019-09-25 MED ORDER — DIPHENHYDRAMINE HCL 50 MG/ML IJ SOLN
50.0000 mg | Freq: Once | INTRAMUSCULAR | Status: AC
  Administered 2019-09-25: 50 mg via INTRAVENOUS

## 2019-09-25 MED ORDER — SODIUM CHLORIDE 0.9% FLUSH
10.0000 mL | INTRAVENOUS | Status: DC | PRN
  Administered 2019-09-25: 10 mL
  Filled 2019-09-25: qty 10

## 2019-09-25 MED ORDER — SODIUM CHLORIDE 0.9 % IV SOLN
377.0000 mg | Freq: Once | INTRAVENOUS | Status: AC
  Administered 2019-09-25: 380 mg via INTRAVENOUS
  Filled 2019-09-25: qty 38

## 2019-09-25 MED ORDER — PALONOSETRON HCL INJECTION 0.25 MG/5ML
0.2500 mg | Freq: Once | INTRAVENOUS | Status: AC
  Administered 2019-09-25: 0.25 mg via INTRAVENOUS

## 2019-09-25 MED ORDER — FAMOTIDINE IN NACL 20-0.9 MG/50ML-% IV SOLN
INTRAVENOUS | Status: AC
  Filled 2019-09-25: qty 50

## 2019-09-25 MED ORDER — HEPARIN SOD (PORK) LOCK FLUSH 100 UNIT/ML IV SOLN
500.0000 [IU] | Freq: Once | INTRAVENOUS | Status: AC | PRN
  Administered 2019-09-25: 500 [IU]
  Filled 2019-09-25: qty 5

## 2019-09-25 MED ORDER — SODIUM CHLORIDE 0.9 % IV SOLN
Freq: Once | INTRAVENOUS | Status: AC
  Filled 2019-09-25: qty 250

## 2019-09-25 MED ORDER — PALONOSETRON HCL INJECTION 0.25 MG/5ML
INTRAVENOUS | Status: AC
  Filled 2019-09-25: qty 5

## 2019-09-25 MED ORDER — FAMOTIDINE IN NACL 20-0.9 MG/50ML-% IV SOLN
20.0000 mg | Freq: Once | INTRAVENOUS | Status: AC
  Administered 2019-09-25: 20 mg via INTRAVENOUS

## 2019-09-25 MED ORDER — SODIUM CHLORIDE 0.9 % IV SOLN
175.0000 mg/m2 | Freq: Once | INTRAVENOUS | Status: AC
  Administered 2019-09-25: 318 mg via INTRAVENOUS
  Filled 2019-09-25: qty 53

## 2019-09-25 MED ORDER — SODIUM CHLORIDE 0.9 % IV SOLN
10.0000 mg | Freq: Once | INTRAVENOUS | Status: AC
  Administered 2019-09-25: 10 mg via INTRAVENOUS
  Filled 2019-09-25: qty 10

## 2019-09-25 MED ORDER — SODIUM CHLORIDE 0.9 % IV SOLN
150.0000 mg | Freq: Once | INTRAVENOUS | Status: AC
  Administered 2019-09-25: 150 mg via INTRAVENOUS
  Filled 2019-09-25: qty 150

## 2019-09-25 MED ORDER — DIPHENHYDRAMINE HCL 50 MG/ML IJ SOLN
INTRAMUSCULAR | Status: AC
  Filled 2019-09-25: qty 1

## 2019-09-25 MED ORDER — SODIUM CHLORIDE 0.9 % IV SOLN
360.0000 mg | Freq: Once | INTRAVENOUS | Status: AC
  Administered 2019-09-25: 360 mg via INTRAVENOUS
  Filled 2019-09-25: qty 24

## 2019-09-25 NOTE — Patient Instructions (Signed)
Shaun Kennedy Discharge Instructions for Patients Receiving Chemotherapy  Today you received the following chemotherapy agents: Opdivo, Taxol, Carboplatin  To help prevent nausea and vomiting after your treatment, we encourage you to take your nausea medication as directed.   If you develop nausea and vomiting that is not controlled by your nausea medication, call the clinic.   BELOW ARE SYMPTOMS THAT SHOULD BE REPORTED IMMEDIATELY:  *FEVER GREATER THAN 100.5 F  *CHILLS WITH OR WITHOUT FEVER  NAUSEA AND VOMITING THAT IS NOT CONTROLLED WITH YOUR NAUSEA MEDICATION  *UNUSUAL SHORTNESS OF BREATH  *UNUSUAL BRUISING OR BLEEDING  TENDERNESS IN MOUTH AND THROAT WITH OR WITHOUT PRESENCE OF ULCERS  *URINARY PROBLEMS  *BOWEL PROBLEMS  UNUSUAL RASH Items with * indicate a potential emergency and should be followed up as soon as possible.  Feel free to call the clinic should you have any questions or concerns. The clinic phone number is [phone].  Please show the [facility] at check-in to the Emergency Department and triage nurse.

## 2019-09-25 NOTE — Progress Notes (Signed)
Nutrition Follow-up:  Patient with stage IV non small cell lung cancer.  Patient receiving carboplatin, taxol, yervoy, opdivo.    Met with patient during infusion.  Patient reports only able to take liquids since Monday.  Reports difficulty swallowing solid foods.  MD is aware and noted adding carafate for radiation esophagitis.  Patient reports that he has been drinking about 3 smoothies per day (ensure plus, frozen fruit, protein powder).  Reports drinking 7-8 glasses of water per day   Medications: carafate added today  Labs: reviewed  Anthropometrics:   Weight 148 lb 12.8 oz on 8/18 decreased from 155 lb on 7/27   NUTRITION DIAGNOSIS: Inadequate oral intake continues   INTERVENTION:  Provided handout to patient on how to make high calorie smoothie/shake. Encouraged at least 4 shakes per day if able for added calories and protein Discussed chopping, grinding, pureeing foods to change consistency to more liquid form for ease of swallowing.       MONITORING, EVALUATION, GOAL: weight, intake   NEXT VISIT: Sept 8 during infusion  Jayleena Stille B. Zenia Resides, [facility], [place] Registered Dietitian [phone] (mobile)

## 2019-09-25 NOTE — Progress Notes (Signed)
[place] [phone]   [phone]  OFFICE PROGRESS NOTE  Shaun Kennedy, [place] [address]  DIAGNOSIS: stage IV (T3, N2, M1 a) non-small cell Kennedy cancer, squamous cell carcinoma presented with obstructive left lower lobe Kennedy mass in addition to mediastinal lymphadenopathy as well as bilateral pulmonary nodules diagnosed in July 2021.  PDL1: 0%  PRIOR THERAPY: None  CURRENT THERAPY: 1) Palliative radiotherapy to the obstructive left lower lobe Kennedy mass under the care of Dr. Lisbeth Renshaw.  2)  systemic chemotherapy 2 cycles of chemotherapy with carboplatin for an AUC of 5 and paclitaxel 175 mg/m2 in addition to immunotherapy with nivolumab 360 mg every 3 weeks and ipilimumab 1 mg/kg IV every 6 weeks followed by maintenance nivolumab and ipilimumab.  He started the first treatment on 09/04/2019.  INTERVAL HISTORY: Shaun Kennedy 79 y.o. male returns to the clinic today for follow-up visit accompanied by his wife.  His 2 daughters were available by phone during the visit and he had a lot of questions.  The patient is feeling fine today except for odynophagia from the recent radiation.  He also has constipation and uses MiraLAX with no significant improvement.  The patient continues to complain of fatigue.  He denied having any chest pain, shortness of breath, cough or hemoptysis.  He denied having any nausea, vomiting or diarrhea.  He lost few pounds since his last visit secondary to lack of appetite.  He is here today for evaluation before starting day 22 of cycle #1.   MEDICAL HISTORY: Past Medical History:  Diagnosis Date  . Allergic rhinitis   . Asthma   . Carotid stenosis   . Colon cancer ([place]) 2003  . Diabetes mellitus ([place])   . Diverticulosis   . Dyslipidemia   . ED (erectile dysfunction)   . GERD (gastroesophageal reflux disease)   . H/O degenerative disc disease   . Hemorrhoids   . HTN (hypertension)    . Hyperlipidemia   . LVH (left ventricular hypertrophy)    on EKG  . Mass of lower lobe of left Kennedy   . Mediastinal adenopathy   . Smoker    former  . Wears dentures   . Wears glasses     ALLERGIES:  has No Known Allergies.  MEDICATIONS:  Current Outpatient Medications  Medication Sig Dispense Refill  . amLODipine (NORVASC) 5 MG tablet Take 1 tablet (5 mg total) by mouth daily. (Patient taking differently: Take 5 mg by mouth every evening. ) 90 tablet 3  . aspirin EC 81 MG tablet Take 81 mg by mouth daily after breakfast.     . Blood Glucose Monitoring Suppl (ONETOUCH VERIO) w/Device KIT USE TO CHECK SUGAR DAILY 1 kit 0  . Blood Glucose Monitoring Suppl (ONETOUCH VERIO) w/Device KIT USE TO CHECK SUGAR DAILY 1 kit 0  . cholecalciferol (VITAMIN D) 1000 units tablet Take 1,000 Units by mouth daily after breakfast.     . docusate sodium (COLACE) 100 MG capsule Take 1 capsule (100 mg total) by mouth 2 (two) times daily as needed for mild constipation. 15 capsule 0  . ferrous sulfate 325 (65 FE) MG EC tablet Take 1 tablet (325 mg total) by mouth daily with breakfast. 30 tablet 12  . guaiFENesin-codeine (ROBITUSSIN AC) 100-10 MG/5ML syrup Take 5 mLs by mouth 3 (three) times daily as needed for cough. 240 mL 0  . Lancets (ONETOUCH DELICA PLUS [id number]) MISC PATIENT IS  TO TEST TWO TIMES A DAY DX: E11.9 (ONETOUCH VERIO FLEX) 100 each 12  . lidocaine-prilocaine (EMLA) cream Apply to the Port-A-Cath site 30-60-minute before chemotherapy. 30 g 0  . lisinopril-hydrochlorothiazide (ZESTORETIC) 20-12.5 MG tablet Take 1 tablet by mouth daily. (Patient taking differently: Take 1 tablet by mouth daily after breakfast. ) 90 tablet 2  . LORazepam (ATIVAN) 0.5 MG tablet 1 tablet 30 minutes before the MRI.  Repeat once if needed. 2 tablet 0  . metFORMIN (GLUCOPHAGE-XR) 500 MG 24 hr tablet TAKE 1 TABLET BY MOUTH EVERY DAY (Patient taking differently: Take 500 mg by mouth daily after breakfast. ) 90 tablet 1   . methylPREDNISolone (MEDROL DOSEPAK) 4 MG TBPK tablet Use as instructed. (Patient not taking: Reported on 09/23/2019) 21 tablet 0  . mirabegron ER (MYRBETRIQ) 50 MG TB24 tablet Take 1 tablet (50 mg total) by mouth daily. 14 tablet 0  . mirtazapine (REMERON) 15 MG tablet Take 1 tablet (15 mg total) by mouth at bedtime. 30 tablet 2  . Multiple Vitamin (MULTIVITAMIN WITH MINERALS) TABS tablet Take 1 tablet by mouth daily after breakfast.     . ONETOUCH VERIO test strip USE AS INSTRUCTED TO CHECK TWICE DAILY 200 strip 5  . prochlorperazine (COMPAZINE) 10 MG tablet Take 1 tablet (10 mg total) by mouth every 6 (six) hours as needed for nausea or vomiting. (Patient not taking: Reported on 09/23/2019) 30 tablet 0  . simvastatin (ZOCOR) 40 MG tablet Take 1 tablet (40 mg total) by mouth daily. (Patient taking differently: Take 40 mg by mouth every evening. ) 90 tablet 3   No current facility-administered medications for this visit.   Facility-Administered Medications Ordered in Other Visits  Medication Dose Route Frequency Provider Last Rate Last Admin  . sodium chloride flush (NS) 0.9 % injection 10 mL  10 mL Intracatheter PRN Curt Bears, MD   10 mL at 09/04/19 1556    SURGICAL HISTORY:  Past Surgical History:  Procedure Laterality Date  . BRONCHIAL BIOPSY  08/20/2019   Procedure: BRONCHIAL BIOPSIES;  Surgeon: Collene Gobble, MD;  Location: [facility] ENDOSCOPY;  Service: Pulmonary;;  . BRONCHIAL BRUSHINGS  08/20/2019   Procedure: BRONCHIAL BRUSHINGS;  Surgeon: Collene Gobble, MD;  Location: [facility] ENDOSCOPY;  Service: Pulmonary;;  . BRONCHIAL NEEDLE ASPIRATION BIOPSY  08/20/2019   Procedure: BRONCHIAL NEEDLE ASPIRATION BIOPSIES;  Surgeon: Collene Gobble, MD;  Location: [facility] ENDOSCOPY;  Service: Pulmonary;;  . CARPAL TUNNEL RELEASE Right 01/09/2019   Procedure: CARPAL TUNNEL RELEASE;  Surgeon: Leandrew Koyanagi, MD;  Location: [facility];  Service: Orthopedics;  Laterality: Right;  . CATARACT  EXTRACTION Right 2018  . COLONOSCOPY  2007   Dr. Benson [place]  . I & D EXTREMITY Right 04/02/2016   Procedure: IRRIGATION AND DEBRIDEMENT GREAT TOE;  Surgeon: Leandrew Koyanagi, MD;  Location: [place];  Service: Orthopedics;  Laterality: Right;  . IR IMAGING GUIDED PORT INSERTION  08/30/2019  . MULTIPLE TOOTH EXTRACTIONS    . RADIOACTIVE SEED IMPLANT  2003  . ULNAR TUNNEL RELEASE Right 01/09/2019   Procedure: RIGHT CUBITAL TUNNEL RELEASE AND CARPAL TUNNEL RELEASE;  Surgeon: Leandrew Koyanagi, MD;  Location: [facility];  Service: Orthopedics;  Laterality: Right;  . UPPER GASTROINTESTINAL ENDOSCOPY    . VIDEO BRONCHOSCOPY WITH ENDOBRONCHIAL ULTRASOUND N/A 08/20/2019   Procedure: VIDEO BRONCHOSCOPY WITH ENDOBRONCHIAL ULTRASOUND;  Surgeon: Collene Gobble, MD;  Location: [facility] ENDOSCOPY;  Service: Pulmonary;  Laterality: N/A;    REVIEW OF SYSTEMS:  Constitutional: positive for anorexia, fatigue and weight loss Eyes: negative Ears, nose, mouth, throat, and face: negative Respiratory: negative Cardiovascular: negative Gastrointestinal: positive for constipation and odynophagia Genitourinary:negative Integument/breast: negative Hematologic/lymphatic: negative Musculoskeletal:positive for muscle weakness Neurological: negative Behavioral/Psych: negative Endocrine: negative Allergic/Immunologic: negative   PHYSICAL EXAMINATION: General appearance: alert, cooperative, fatigued and no distress Head: Normocephalic, without obvious abnormality, atraumatic Neck: no adenopathy, no JVD, supple, symmetrical, trachea midline and thyroid not enlarged, symmetric, no tenderness/mass/nodules Lymph nodes: Cervical, supraclavicular, and axillary nodes normal. Resp: clear to auscultation bilaterally Back: symmetric, no curvature. ROM normal. No CVA tenderness. Cardio: regular rate and rhythm, S1, S2 normal, no murmur, click, rub or gallop GI: soft, non-tender; bowel sounds normal; no masses,  no  organomegaly Extremities: extremities normal, atraumatic, no cyanosis or edema Neurologic: Alert and oriented X 3, normal strength and tone. Normal symmetric reflexes. Normal coordination and gait  ECOG PERFORMANCE STATUS: 1 - Symptomatic but completely ambulatory  Blood pressure (!) 118/54, pulse 96, temperature (!) 97.4 F (36.3 C), temperature source Tympanic, resp. rate 17, height '5\' 7"'  (1.702 m), weight 148 lb 12.8 oz (67.5 kg), SpO2 97 %.  LABORATORY DATA: Lab Results  Component Value Date   WBC 3.2 (L) 09/17/2019   HGB 8.5 (L) 09/17/2019   HCT 25.3 (L) 09/17/2019   MCV 85.8 09/17/2019   PLT 395 09/17/2019      Chemistry      Component Value Date/Time   NA 130 (L) 09/17/2019 1256   NA 132 (L) 08/14/2019 0918   K 4.9 09/17/2019 1256   CL 96 (L) 09/17/2019 1256   CO2 26 09/17/2019 1256   BUN 22 09/17/2019 1256   BUN 16 08/14/2019 0918   CREATININE 1.17 09/17/2019 1256   CREATININE 1.25 (H) 08/08/2016 0912      Component Value Date/Time   CALCIUM 9.8 09/17/2019 1256   ALKPHOS 62 09/17/2019 1256   AST 19 09/17/2019 1256   ALT 13 09/17/2019 1256   BILITOT 0.4 09/17/2019 1256       RADIOGRAPHIC STUDIES: MR BRAIN W WO CONTRAST  Result Date: 09/06/2019 CLINICAL DATA:  Non-small cell Kennedy cancer staging EXAM: MRI HEAD WITHOUT AND WITH CONTRAST TECHNIQUE: Multiplanar, multiecho pulse sequences of the brain and surrounding structures were obtained without and with intravenous contrast. CONTRAST:  85m GADAVIST GADOBUTROL 1 MMOL/ML IV SOLN COMPARISON:  None. FINDINGS: Brain: No mass or swelling to suggest metastatic disease. Chronic small vessel ischemia in the cerebral white matter which is extensive and confluent around the lateral ventricles. Chronic lacune at the left thalamus. Normal brain volume. No recent infarct, hemorrhage, or hydrocephalus. Vascular: Normal flow voids and vascular enhancement Skull and upper cervical spine: No evidence of bony metastasis.  Sinuses/Orbits: No evidence of metastatic disease IMPRESSION: Negative for metastatic disease. Extensive chronic small vessel ischemia. Electronically Signed   By: JMonte FantasiaM.D.   On: 09/06/2019 09:42   NM PET Image Initial (PI) Skull Base To Thigh  Result Date: 09/13/2019 CLINICAL DATA:  Initial treatment strategy for non-small cell Kennedy cancer. EXAM: NUCLEAR MEDICINE PET SKULL BASE TO THIGH TECHNIQUE: 7.9 mCi F-18 FDG was injected intravenously. Full-ring PET imaging was performed from the skull base to thigh after the radiotracer. CT data was obtained and used for attenuation correction and anatomic localization. Fasting blood glucose: 132 mg/dl COMPARISON:  Chest CT 08/16/2019 FINDINGS: Mediastinal blood pool activity: SUV max 2.21 Liver activity: SUV max NA NECK: No neck mass or neck adenopathy. There is an 8 mm right supraclavicular  node which is hypermetabolic with SUV max of 1.65. Incidental CT findings: none CHEST: The large partially necrotic left lower lobe Kennedy mass is hypermetabolic with SUV max of 53.74. Extensive hypermetabolic mediastinal lymphadenopathy involving the prevascular lymph nodes, AP window nodes and contralateral right paratracheal nodes. No definite subcarinal disease. The left paratracheal node measures 17 mm and the SUV max is 10.0 Extensive pulmonary a metastatic disease with numerous hypermetabolic pulmonary nodules throughout both lungs. Incidental CT findings: none ABDOMEN/PELVIS: No PET-CT findings suspicious for abdominal/pelvic metastatic disease. No hypermetabolic liver or adrenal gland lesions are identified. No abdominal/pelvic lymphadenopathy. There is fairly significant circumferential FDG uptake in the rectum and there is moderate diffuse rectal wall thickening. SUV max is 12.1. Findings could be due to proctitis but could not exclude the possibility of tumor. Recommend correlation with rectal exam. Incidental CT findings: Extensive brachytherapy seeds are noted  in the prostate gland suggesting prior prostate cancer. No pelvic or retroperitoneal adenopathy. SKELETON: No hypermetabolic bone lesions to suggest metastatic disease. Incidental CT findings: none IMPRESSION: 1. Large partially necrotic left lower lobe mass is hypermetabolic and consistent with known Kennedy neoplasm. 2. Extensive mediastinal and hilar metastatic adenopathy and extensive pulmonary metastatic nodules. 3. Hypermetabolic right supraclavicular lymph nodes. 4. No findings for metastatic disease involving the abdomen/pelvis or bony structures. 5. Hypermetabolism in a diffusely thickened rectal wall. Could not exclude rectal neoplasm. Electronically Signed   By: Marijo Sanes M.D.   On: 09/13/2019 16:30   DG Abd Acute W/Chest  Result Date: 09/09/2019 CLINICAL DATA:  Constipation for 5 days EXAM: DG ABDOMEN ACUTE W/ 1V CHEST COMPARISON:  CT 08/16/2019 FINDINGS: Power port type central venous catheter with tip over the low SVC region. No pneumothorax. Heart size and pulmonary vascularity are normal. Left hilar/perihilar mass is better demonstrated on previous CT chest. No pleural effusion or consolidation. Additional nodules in the left Kennedy apex. Gas and stool throughout the colon. No small or large bowel distention. No free intra-abdominal air. No abnormal air-fluid levels. No radiopaque stones. Radiopaque densities in the pelvis likely represent prostate seed implants. Vascular calcifications. Degenerative changes in the spine and hips. IMPRESSION: 1. Nonobstructive bowel gas pattern with stool-filled colon. 2. Left hilar/perihilar mass with additional nodules in the left Kennedy apex. Electronically Signed   By: Lucienne Capers M.D.   On: 09/09/2019 04:04   IR IMAGING GUIDED PORT INSERTION  Result Date: 08/30/2019 INDICATION: 79 year old male referred for port catheter placement EXAM: IMAGE GUIDED PORT CATHETER PLACEMENT MEDICATIONS: 2 g Ancef; The antibiotic was administered within an appropriate  time interval prior to skin puncture. ANESTHESIA/SEDATION: Moderate (conscious) sedation was employed during this procedure. A total of Versed 1.5 mg and Fentanyl 75 mcg was administered intravenously. Moderate Sedation Time: 14 minutes. The patient's level of consciousness and vital signs were monitored continuously by radiology nursing throughout the procedure under my direct supervision. FLUOROSCOPY TIME:  Fluoroscopy Time: 0 minutes 12 seconds (2 mGy). COMPLICATIONS: None PROCEDURE: The procedure, risks, benefits, and alternatives were explained to the patient. Questions regarding the procedure were encouraged and answered. The patient understands and consents to the procedure. Ultrasound survey was performed with images stored and sent to PACs. The right neck and chest was prepped with chlorhexidine, and draped in the usual sterile fashion using maximum barrier technique (cap and mask, sterile gown, sterile gloves, large sterile sheet, hand hygiene and cutaneous antiseptic). Antibiotic prophylaxis was provided with 2.0g Ancef administered IV one hour prior to skin incision. Local anesthesia was attained by  infiltration with 1% lidocaine without epinephrine. Ultrasound demonstrated patency of the right internal jugular vein, and this was documented with an image. Under real-time ultrasound guidance, this vein was accessed with a 21 gauge micropuncture needle and image documentation was performed. A small dermatotomy was made at the access site with an 11 scalpel. A 0.018" wire was advanced into the SVC and used to estimate the length of the internal catheter. The access needle exchanged for a 78F micropuncture vascular sheath. The 0.018" wire was then removed and a 0.035" wire advanced into the IVC. An appropriate location for the subcutaneous reservoir was selected below the clavicle and an incision was made through the skin and underlying soft tissues. The subcutaneous tissues were then dissected using a  combination of blunt and sharp surgical technique and a pocket was formed. A single lumen power injectable portacatheter was then tunneled through the subcutaneous tissues from the pocket to the dermatotomy and the port reservoir placed within the subcutaneous pocket. The venous access site was then serially dilated and a peel away vascular sheath placed over the wire. The wire was removed and the port catheter advanced into position under fluoroscopic guidance. The catheter tip is positioned in the cavoatrial junction. This was documented with a spot image. The portacatheter was then tested and found to flush and aspirate well. The port was flushed with saline followed by 100 units/mL heparinized saline. The pocket was then closed in two layers using first subdermal inverted interrupted absorbable sutures followed by a running subcuticular suture. The epidermis was then sealed with Dermabond. The dermatotomy at the venous access site was also seal with Dermabond. Patient tolerated the procedure well and remained hemodynamically stable throughout. No complications encountered and no significant blood loss encountered IMPRESSION: Status post right IJ port catheter placement. Signed, Dulcy Fanny. Dellia Nims, RPVI [facility] Electronically Signed   By: Corrie Mckusick D.O.   On: 08/30/2019 14:49    ASSESSMENT AND PLAN: This is a very pleasant 79 years old [name] with stage IV non-small cell Kennedy cancer, squamous cell carcinoma with negative PD-L1 expression. The patient is currently undergoing systemic chemotherapy with carboplatin and paclitaxel for 2 cycles in addition to immunotherapy with ipilimumab 1 mg/KG every 6 weeks and nivolumab 360 mg IV every 3 weeks status post day 1 of cycle #1.  He is here today for day #22 of cycle #1. The patient tolerated the first cycle of his treatment fairly well. He had palliative radiotherapy to the left  Kennedy mass under the care of Dr. Lisbeth Renshaw and he is complaining of odynophagia. He had a PET scan performed recently.  I discussed the scan results with the patient and his family.  His PET scan showed no concerning findings for disease metastasis outside the chest.  Has some rectal wall thickening questionable for proctitis.  I would consider referring the patient to gastroenterology in the future for evaluation of this condition after he complete the cycle of the chemotherapy. I recommended for the patient to proceed with day #22 of cycle #1 today as planned. For the odynophagia, I will start the patient on Carafate every 6 hours. For the constipation he was advised to continue with the MiraLAX and add milk of magnesia if needed. For the malnutrition and weight loss, he was encouraged to increase his oral intake and hopefully he will eat better after using Carafate. The patient will come back for follow-up visit in 3 weeks for evaluation before  starting day 1 of cycle #2. His daughters had a lot of questions and I answered them completely to their satisfaction. He was advised to call immediately if he has any concerning symptoms in the interval. The patient voices understanding of current disease status and treatment options and is in agreement with the current care plan.  All questions were answered. The patient knows to call the clinic with any problems, questions or concerns. We can certainly see the patient much sooner if necessary.  The total time spent in the appointment was 40 minutes.  Disclaimer: This note was dictated with voice recognition software. Similar sounding words can inadvertently be transcribed and may not be corrected upon review.

## 2019-09-26 ENCOUNTER — Telehealth: Payer: Self-pay | Admitting: Medical Oncology

## 2019-09-26 NOTE — Telephone Encounter (Signed)
Asking if insurance pays for home health. I told him to contact [facility] . He has number and their nurse has been coming to see pt.

## 2019-09-27 ENCOUNTER — Other Ambulatory Visit: Payer: Self-pay | Admitting: Physician Assistant

## 2019-09-27 ENCOUNTER — Inpatient Hospital Stay: Payer: Medicare HMO

## 2019-09-27 ENCOUNTER — Other Ambulatory Visit: Payer: Self-pay

## 2019-09-27 VITALS — BP 100/50 | HR 80 | Temp 98.4°F | Resp 16

## 2019-09-27 DIAGNOSIS — Z5112 Encounter for antineoplastic immunotherapy: Secondary | ICD-10-CM | POA: Diagnosis not present

## 2019-09-27 DIAGNOSIS — C3432 Malignant neoplasm of lower lobe, left bronchus or lung: Secondary | ICD-10-CM

## 2019-09-27 MED ORDER — PEGFILGRASTIM-JMDB 6 MG/0.6ML ~~LOC~~ SOSY
PREFILLED_SYRINGE | SUBCUTANEOUS | Status: AC
  Filled 2019-09-27: qty 0.6

## 2019-09-27 MED ORDER — PEGFILGRASTIM-JMDB 6 MG/0.6ML ~~LOC~~ SOSY
6.0000 mg | PREFILLED_SYRINGE | Freq: Once | SUBCUTANEOUS | Status: AC
  Administered 2019-09-27: 6 mg via SUBCUTANEOUS

## 2019-09-27 NOTE — Patient Instructions (Signed)

## 2019-09-30 ENCOUNTER — Telehealth: Payer: Self-pay | Admitting: Medical Oncology

## 2019-09-30 NOTE — Telephone Encounter (Signed)
Home Health Skilled Nursing visits -ok for 1 x week for 9 weeks.

## 2019-10-01 DIAGNOSIS — R590 Localized enlarged lymph nodes: Secondary | ICD-10-CM | POA: Diagnosis not present

## 2019-10-01 DIAGNOSIS — J45909 Unspecified asthma, uncomplicated: Secondary | ICD-10-CM | POA: Diagnosis not present

## 2019-10-01 DIAGNOSIS — C3432 Malignant neoplasm of lower lobe, left bronchus or lung: Secondary | ICD-10-CM | POA: Diagnosis not present

## 2019-10-01 DIAGNOSIS — I700 Atherosclerosis of aorta: Secondary | ICD-10-CM | POA: Diagnosis not present

## 2019-10-01 DIAGNOSIS — R630 Anorexia: Secondary | ICD-10-CM | POA: Diagnosis not present

## 2019-10-01 DIAGNOSIS — E119 Type 2 diabetes mellitus without complications: Secondary | ICD-10-CM | POA: Diagnosis not present

## 2019-10-01 DIAGNOSIS — E782 Mixed hyperlipidemia: Secondary | ICD-10-CM | POA: Diagnosis not present

## 2019-10-01 DIAGNOSIS — K219 Gastro-esophageal reflux disease without esophagitis: Secondary | ICD-10-CM | POA: Diagnosis not present

## 2019-10-01 DIAGNOSIS — I119 Hypertensive heart disease without heart failure: Secondary | ICD-10-CM | POA: Diagnosis not present

## 2019-10-01 DIAGNOSIS — I6529 Occlusion and stenosis of unspecified carotid artery: Secondary | ICD-10-CM | POA: Diagnosis not present

## 2019-10-02 ENCOUNTER — Inpatient Hospital Stay: Payer: Medicare HMO

## 2019-10-02 ENCOUNTER — Other Ambulatory Visit: Payer: Self-pay | Admitting: Family Medicine

## 2019-10-02 ENCOUNTER — Other Ambulatory Visit: Payer: Self-pay

## 2019-10-02 DIAGNOSIS — Z95828 Presence of other vascular implants and grafts: Secondary | ICD-10-CM

## 2019-10-02 DIAGNOSIS — E1159 Type 2 diabetes mellitus with other circulatory complications: Secondary | ICD-10-CM

## 2019-10-02 DIAGNOSIS — R634 Abnormal weight loss: Secondary | ICD-10-CM

## 2019-10-02 DIAGNOSIS — C3432 Malignant neoplasm of lower lobe, left bronchus or lung: Secondary | ICD-10-CM

## 2019-10-02 DIAGNOSIS — I152 Hypertension secondary to endocrine disorders: Secondary | ICD-10-CM

## 2019-10-02 DIAGNOSIS — Z5112 Encounter for antineoplastic immunotherapy: Secondary | ICD-10-CM | POA: Diagnosis not present

## 2019-10-02 LAB — CMP (CANCER CENTER ONLY)
ALT: 14 U/L (ref 0–44)
AST: 14 U/L — ABNORMAL LOW (ref 15–41)
Albumin: 3.3 g/dL — ABNORMAL LOW (ref 3.5–5.0)
Alkaline Phosphatase: 76 U/L (ref 38–126)
Anion gap: 7 (ref 5–15)
BUN: 23 mg/dL (ref 8–23)
CO2: 29 mmol/L (ref 22–32)
Calcium: 10.2 mg/dL (ref 8.9–10.3)
Chloride: 97 mmol/L — ABNORMAL LOW (ref 98–111)
Creatinine: 1.01 mg/dL (ref 0.61–1.24)
GFR, Est AFR Am: >60 mL/min (ref >60)
GFR, Estimated: >60 mL/min (ref >60)
Glucose, Bld: 125 mg/dL — ABNORMAL HIGH (ref 70–99)
Potassium: 4.7 mmol/L (ref 3.5–5.1)
Sodium: 133 mmol/L — ABNORMAL LOW (ref 135–145)
Total Bilirubin: 0.4 mg/dL (ref 0.3–1.2)
Total Protein: 7.6 g/dL (ref 6.5–8.1)

## 2019-10-02 LAB — CBC WITH DIFFERENTIAL (CANCER CENTER ONLY)
Abs Immature Granulocytes: 0.07 10*3/uL (ref 0.00–0.07)
Basophils Absolute: 0 10*3/uL (ref 0.0–0.1)
Basophils Relative: 0 %
Eosinophils Absolute: 0.1 10*3/uL (ref 0.0–0.5)
Eosinophils Relative: 1 %
HCT: 24.9 % — ABNORMAL LOW (ref 39.0–52.0)
Hemoglobin: 8.1 g/dL — ABNORMAL LOW (ref 13.0–17.0)
Immature Granulocytes: 1 %
Lymphocytes Relative: 10 %
Lymphs Abs: 0.8 10*3/uL (ref 0.7–4.0)
MCH: 28.1 pg (ref 26.0–34.0)
MCHC: 32.5 g/dL (ref 30.0–36.0)
MCV: 86.5 fL (ref 80.0–100.0)
Monocytes Absolute: 1 10*3/uL (ref 0.1–1.0)
Monocytes Relative: 13 %
Neutro Abs: 5.9 10*3/uL (ref 1.7–7.7)
Neutrophils Relative %: 75 %
Platelet Count: 247 10*3/uL (ref 150–400)
RBC: 2.88 MIL/uL — ABNORMAL LOW (ref 4.22–5.81)
RDW: 13.2 % (ref 11.5–15.5)
WBC Count: 7.9 10*3/uL (ref 4.0–10.5)
nRBC: 0 % (ref 0.0–0.2)

## 2019-10-02 MED ORDER — HEPARIN SOD (PORK) LOCK FLUSH 100 UNIT/ML IV SOLN
500.0000 [IU] | Freq: Once | INTRAVENOUS | Status: AC
  Administered 2019-10-02: 500 [IU]
  Filled 2019-10-02: qty 5

## 2019-10-02 MED ORDER — SODIUM CHLORIDE 0.9% FLUSH
10.0000 mL | Freq: Once | INTRAVENOUS | Status: AC
  Administered 2019-10-02: 10 mL
  Filled 2019-10-02: qty 10

## 2019-10-02 NOTE — Patient Instructions (Signed)

## 2019-10-03 DIAGNOSIS — C3432 Malignant neoplasm of lower lobe, left bronchus or lung: Secondary | ICD-10-CM | POA: Diagnosis not present

## 2019-10-03 DIAGNOSIS — J45909 Unspecified asthma, uncomplicated: Secondary | ICD-10-CM | POA: Diagnosis not present

## 2019-10-03 DIAGNOSIS — K219 Gastro-esophageal reflux disease without esophagitis: Secondary | ICD-10-CM | POA: Diagnosis not present

## 2019-10-03 DIAGNOSIS — R630 Anorexia: Secondary | ICD-10-CM | POA: Diagnosis not present

## 2019-10-03 DIAGNOSIS — I6529 Occlusion and stenosis of unspecified carotid artery: Secondary | ICD-10-CM | POA: Diagnosis not present

## 2019-10-03 DIAGNOSIS — I700 Atherosclerosis of aorta: Secondary | ICD-10-CM | POA: Diagnosis not present

## 2019-10-03 DIAGNOSIS — E119 Type 2 diabetes mellitus without complications: Secondary | ICD-10-CM | POA: Diagnosis not present

## 2019-10-03 DIAGNOSIS — I119 Hypertensive heart disease without heart failure: Secondary | ICD-10-CM | POA: Diagnosis not present

## 2019-10-03 DIAGNOSIS — E782 Mixed hyperlipidemia: Secondary | ICD-10-CM | POA: Diagnosis not present

## 2019-10-03 DIAGNOSIS — R590 Localized enlarged lymph nodes: Secondary | ICD-10-CM | POA: Diagnosis not present

## 2019-10-04 DIAGNOSIS — C3432 Malignant neoplasm of lower lobe, left bronchus or lung: Secondary | ICD-10-CM | POA: Diagnosis not present

## 2019-10-04 DIAGNOSIS — I700 Atherosclerosis of aorta: Secondary | ICD-10-CM | POA: Diagnosis not present

## 2019-10-04 DIAGNOSIS — K219 Gastro-esophageal reflux disease without esophagitis: Secondary | ICD-10-CM | POA: Diagnosis not present

## 2019-10-04 DIAGNOSIS — E782 Mixed hyperlipidemia: Secondary | ICD-10-CM | POA: Diagnosis not present

## 2019-10-04 DIAGNOSIS — E119 Type 2 diabetes mellitus without complications: Secondary | ICD-10-CM | POA: Diagnosis not present

## 2019-10-04 DIAGNOSIS — R590 Localized enlarged lymph nodes: Secondary | ICD-10-CM | POA: Diagnosis not present

## 2019-10-04 DIAGNOSIS — R630 Anorexia: Secondary | ICD-10-CM | POA: Diagnosis not present

## 2019-10-04 DIAGNOSIS — I6529 Occlusion and stenosis of unspecified carotid artery: Secondary | ICD-10-CM | POA: Diagnosis not present

## 2019-10-04 DIAGNOSIS — J45909 Unspecified asthma, uncomplicated: Secondary | ICD-10-CM | POA: Diagnosis not present

## 2019-10-04 DIAGNOSIS — I119 Hypertensive heart disease without heart failure: Secondary | ICD-10-CM | POA: Diagnosis not present

## 2019-10-04 NOTE — Progress Notes (Signed)
  Radiation Oncology         [phone] ________________________________  Name: Shaun Kennedy MRN: [id number]  Date: 09/20/2019  DOB: [birth year]  End of Treatment Note  Diagnosis:   lung cancer     Indication for treatment::  palliative       Radiation treatment dates:   09/02/19 - 09/20/19  Site/dose:    The left lung was treated to a dose of 37.5 Gy in 15 fractions using a 4-field 3D conformal technique.  Narrative: The patient tolerated radiation treatment relatively well.     Plan: The patient has completed radiation treatment. The patient will return to radiation oncology clinic for routine followup in one month. I advised the patient to call or return sooner if they have any questions or concerns related to their recovery or treatment. ________________________________  Jodelle Gross, M.D., Ph.D.

## 2019-10-05 ENCOUNTER — Encounter: Payer: Self-pay | Admitting: Internal Medicine

## 2019-10-06 ENCOUNTER — Other Ambulatory Visit: Payer: Self-pay | Admitting: Family Medicine

## 2019-10-09 ENCOUNTER — Inpatient Hospital Stay: Payer: Medicare HMO | Attending: Internal Medicine

## 2019-10-09 ENCOUNTER — Other Ambulatory Visit: Payer: Self-pay

## 2019-10-09 ENCOUNTER — Inpatient Hospital Stay: Payer: Medicare HMO

## 2019-10-09 DIAGNOSIS — C771 Secondary and unspecified malignant neoplasm of intrathoracic lymph nodes: Secondary | ICD-10-CM | POA: Diagnosis not present

## 2019-10-09 DIAGNOSIS — Z7984 Long term (current) use of oral hypoglycemic drugs: Secondary | ICD-10-CM | POA: Diagnosis not present

## 2019-10-09 DIAGNOSIS — R69 Illness, unspecified: Secondary | ICD-10-CM | POA: Diagnosis not present

## 2019-10-09 DIAGNOSIS — D6481 Anemia due to antineoplastic chemotherapy: Secondary | ICD-10-CM | POA: Diagnosis not present

## 2019-10-09 DIAGNOSIS — Z95828 Presence of other vascular implants and grafts: Secondary | ICD-10-CM

## 2019-10-09 DIAGNOSIS — C7802 Secondary malignant neoplasm of left lung: Secondary | ICD-10-CM | POA: Diagnosis not present

## 2019-10-09 DIAGNOSIS — Z79899 Other long term (current) drug therapy: Secondary | ICD-10-CM | POA: Diagnosis not present

## 2019-10-09 DIAGNOSIS — I10 Essential (primary) hypertension: Secondary | ICD-10-CM | POA: Insufficient documentation

## 2019-10-09 DIAGNOSIS — C3432 Malignant neoplasm of lower lobe, left bronchus or lung: Secondary | ICD-10-CM

## 2019-10-09 DIAGNOSIS — C7801 Secondary malignant neoplasm of right lung: Secondary | ICD-10-CM | POA: Insufficient documentation

## 2019-10-09 DIAGNOSIS — Z7982 Long term (current) use of aspirin: Secondary | ICD-10-CM | POA: Diagnosis not present

## 2019-10-09 DIAGNOSIS — Z5112 Encounter for antineoplastic immunotherapy: Secondary | ICD-10-CM | POA: Insufficient documentation

## 2019-10-09 DIAGNOSIS — T451X5A Adverse effect of antineoplastic and immunosuppressive drugs, initial encounter: Secondary | ICD-10-CM | POA: Diagnosis not present

## 2019-10-09 LAB — CBC WITH DIFFERENTIAL (CANCER CENTER ONLY)
Abs Immature Granulocytes: 0.07 10*3/uL (ref 0.00–0.07)
Basophils Absolute: 0 10*3/uL (ref 0.0–0.1)
Basophils Relative: 0 %
Eosinophils Absolute: 0.1 10*3/uL (ref 0.0–0.5)
Eosinophils Relative: 1 %
HCT: 24.3 % — ABNORMAL LOW (ref 39.0–52.0)
Hemoglobin: 7.8 g/dL — ABNORMAL LOW (ref 13.0–17.0)
Immature Granulocytes: 1 %
Lymphocytes Relative: 13 %
Lymphs Abs: 1.2 10*3/uL (ref 0.7–4.0)
MCH: 28.7 pg (ref 26.0–34.0)
MCHC: 32.1 g/dL (ref 30.0–36.0)
MCV: 89.3 fL (ref 80.0–100.0)
Monocytes Absolute: 0.9 10*3/uL (ref 0.1–1.0)
Monocytes Relative: 9 %
Neutro Abs: 7.2 10*3/uL (ref 1.7–7.7)
Neutrophils Relative %: 76 %
Platelet Count: 265 10*3/uL (ref 150–400)
RBC: 2.72 MIL/uL — ABNORMAL LOW (ref 4.22–5.81)
RDW: 14.7 % (ref 11.5–15.5)
WBC Count: 9.5 10*3/uL (ref 4.0–10.5)
nRBC: 0 % (ref 0.0–0.2)

## 2019-10-09 LAB — CMP (CANCER CENTER ONLY)
ALT: 12 U/L (ref 0–44)
AST: 16 U/L (ref 15–41)
Albumin: 3.1 g/dL — ABNORMAL LOW (ref 3.5–5.0)
Alkaline Phosphatase: 75 U/L (ref 38–126)
Anion gap: 7 (ref 5–15)
BUN: 16 mg/dL (ref 8–23)
CO2: 29 mmol/L (ref 22–32)
Calcium: 9.9 mg/dL (ref 8.9–10.3)
Chloride: 100 mmol/L (ref 98–111)
Creatinine: 0.84 mg/dL (ref 0.61–1.24)
GFR, Est AFR Am: >60 mL/min (ref >60)
GFR, Estimated: >60 mL/min (ref >60)
Glucose, Bld: 95 mg/dL (ref 70–99)
Potassium: 4.3 mmol/L (ref 3.5–5.1)
Sodium: 136 mmol/L (ref 135–145)
Total Bilirubin: 0.3 mg/dL (ref 0.3–1.2)
Total Protein: 7.3 g/dL (ref 6.5–8.1)

## 2019-10-09 MED ORDER — HEPARIN SOD (PORK) LOCK FLUSH 100 UNIT/ML IV SOLN
500.0000 [IU] | Freq: Once | INTRAVENOUS | Status: AC
  Administered 2019-10-09: 500 [IU]
  Filled 2019-10-09: qty 5

## 2019-10-09 MED ORDER — SODIUM CHLORIDE 0.9% FLUSH
10.0000 mL | Freq: Once | INTRAVENOUS | Status: AC
  Administered 2019-10-09: 10 mL
  Filled 2019-10-09: qty 10

## 2019-10-09 NOTE — Patient Instructions (Signed)

## 2019-10-10 DIAGNOSIS — R69 Illness, unspecified: Secondary | ICD-10-CM | POA: Diagnosis not present

## 2019-10-11 DIAGNOSIS — J45909 Unspecified asthma, uncomplicated: Secondary | ICD-10-CM | POA: Diagnosis not present

## 2019-10-11 DIAGNOSIS — I6529 Occlusion and stenosis of unspecified carotid artery: Secondary | ICD-10-CM | POA: Diagnosis not present

## 2019-10-11 DIAGNOSIS — I119 Hypertensive heart disease without heart failure: Secondary | ICD-10-CM | POA: Diagnosis not present

## 2019-10-11 DIAGNOSIS — R630 Anorexia: Secondary | ICD-10-CM | POA: Diagnosis not present

## 2019-10-11 DIAGNOSIS — K219 Gastro-esophageal reflux disease without esophagitis: Secondary | ICD-10-CM | POA: Diagnosis not present

## 2019-10-11 DIAGNOSIS — E119 Type 2 diabetes mellitus without complications: Secondary | ICD-10-CM | POA: Diagnosis not present

## 2019-10-11 DIAGNOSIS — E782 Mixed hyperlipidemia: Secondary | ICD-10-CM | POA: Diagnosis not present

## 2019-10-11 DIAGNOSIS — R590 Localized enlarged lymph nodes: Secondary | ICD-10-CM | POA: Diagnosis not present

## 2019-10-11 DIAGNOSIS — I700 Atherosclerosis of aorta: Secondary | ICD-10-CM | POA: Diagnosis not present

## 2019-10-11 DIAGNOSIS — C3432 Malignant neoplasm of lower lobe, left bronchus or lung: Secondary | ICD-10-CM | POA: Diagnosis not present

## 2019-10-15 ENCOUNTER — Encounter: Payer: Self-pay | Admitting: Pulmonary Disease

## 2019-10-15 ENCOUNTER — Other Ambulatory Visit: Payer: Self-pay | Admitting: Pulmonary Disease

## 2019-10-15 ENCOUNTER — Telehealth: Payer: Self-pay | Admitting: Emergency Medicine

## 2019-10-15 ENCOUNTER — Ambulatory Visit: Payer: Medicare HMO | Admitting: Pulmonary Disease

## 2019-10-15 ENCOUNTER — Other Ambulatory Visit: Payer: Self-pay

## 2019-10-15 ENCOUNTER — Ambulatory Visit (INDEPENDENT_AMBULATORY_CARE_PROVIDER_SITE_OTHER): Payer: Medicare HMO

## 2019-10-15 VITALS — BP 122/62 | HR 93 | Temp 97.3°F | Ht 67.5 in | Wt 157.1 lb

## 2019-10-15 DIAGNOSIS — C3432 Malignant neoplasm of lower lobe, left bronchus or lung: Secondary | ICD-10-CM

## 2019-10-15 DIAGNOSIS — R0602 Shortness of breath: Secondary | ICD-10-CM | POA: Diagnosis not present

## 2019-10-15 DIAGNOSIS — J209 Acute bronchitis, unspecified: Secondary | ICD-10-CM

## 2019-10-15 DIAGNOSIS — J440 Chronic obstructive pulmonary disease with acute lower respiratory infection: Secondary | ICD-10-CM | POA: Diagnosis not present

## 2019-10-15 DIAGNOSIS — C3490 Malignant neoplasm of unspecified part of unspecified bronchus or lung: Secondary | ICD-10-CM | POA: Diagnosis not present

## 2019-10-15 MED ORDER — STIOLTO RESPIMAT 2.5-2.5 MCG/ACT IN AERS: 2.0000 | INHALATION_SPRAY | Freq: Every day | RESPIRATORY_TRACT | 0 refills | Status: DC

## 2019-10-15 MED ORDER — PREDNISONE 10 MG PO TABS: ORAL_TABLET | ORAL | 0 refills | Status: DC

## 2019-10-15 MED ORDER — DOXYCYCLINE HYCLATE 100 MG PO TABS: 100.0000 mg | ORAL_TABLET | Freq: Two times a day (BID) | ORAL | 0 refills | Status: DC

## 2019-10-15 NOTE — Assessment & Plan Note (Addendum)
Suspect shortness of breath could be multifactorial given ongoing oncology treatments, known left lower lobe malignant neoplasm currently being treated by oncology, hemoglobin is 7.8, suspected COPD  Clinical exam with stridor today.  Chest x-ray potentially showing bronchitis  Plan: Keep upcoming appointment with oncology Discussed hemoglobin levels with oncology tomorrow Prednisone taper today Doxycycline today Start Stiolto We will schedule follow-up with pulmonary function testing and 4 to 6 weeks

## 2019-10-15 NOTE — Patient Instructions (Addendum)
You were seen today by Lauraine Rinne, NP  for:   1. Shortness of breath  Walk today   We will discuss case today with Dr. Lamonte Sakai  Please before leaving here and get scheduled for a 1 hour pulmonary function test  We will trial you on a new inhaler:  Stiolto Respimat inhaler >>>2 puffs daily >>>Take this no matter what >>>This is not a rescue inhaler  Note your daily symptoms > remember "red flags" for COPD:   >>>Increase in cough >>>increase in sputum production >>>increase in shortness of breath or activity  intolerance.   If you notice these symptoms, please call the office to be seen.   Discussed with oncology at tomorrow's appointment that your hemoglobin keeps trending down which can also affect your shortness of breath.  Doxycycline >>> 1 100 mg tablet every 12 hours for 7 days >>>take with food  >>>wear sunscreen   Prednisone 10mg  tablet  >>>4 tabs for 2 days, then 3 tabs for 2 days, 2 tabs for 2 days, then 1 tab for 2 days, then stop >>>take with food  >>>take in the morning    2. Malignant neoplasm of lower lobe of left lung ([place])  Continue follow-up with oncology  Follow Up:    Return in about 6 weeks (around 11/26/2019), or if symptoms worsen or fail to improve, for Follow up with Dr. Lamonte Sakai, Follow up for FULL PFT - 60 min.   Notification of test results are managed in the following manner: If there are  any recommendations or changes to the  plan of care discussed in office today,  we will contact you and let you know what they are. If you do not hear from [place], then your results are normal and you can view them through your  MyChart account , or a letter will be sent to you. Thank you again for trusting [place] with your care  - Thank you, [facility]    It is flu season:   >>> Best ways to protect herself from the flu: Receive the yearly flu vaccine, practice good hand hygiene washing with soap and also using hand sanitizer when available, eat a  nutritious meals, get adequate rest, hydrate appropriately       Please contact the office if your symptoms worsen or you have concerns that you are not improving.   Thank you for choosing  [facility] for your healthcare, and for allowing [place] to partner with you on your healthcare journey. I am thankful to be able to provide care to you today.   Wyn Quaker FNP-C

## 2019-10-15 NOTE — Progress Notes (Signed)
_0  ID: Shaun Kennedy, male    DOB: 1940/08/20, 79 y.o.   MRN: [id number]  Chief Complaint  Patient presents with  . Acute Visit    SOB, Cough with phlegm    Referring provider: [name], MD  HPI:  79 year old male former smoker followed in our office for left lower lobe lung mass, diagnosed with squamous cell lung cancer in July/2021  PMH: Diabetes, hypertension, hyperlipidemia Smoker/ Smoking History: Former smoker.  Quit 1989.  32-pack-year smoking history. Maintenance: None Pt of: Dr. Lamonte Sakai  10/15/2019  - Visit   79 year old male former smoker followed in our office for left lower lobe mass diagnosed with squamous cell lung cancer in July/2021.  Patient was last seen in July/2021 in our office.  Recommendations after that was obtain pulmonary function testing keep follow-up with Dr. Earlie Server in follow-up with Dr. Lamonte Sakai in 3 months.  Patient contacted our office on 10/15/2019.  Notify knees been having trouble breathing that acutely worsened over the weekend.  Dr. Lamonte Sakai suggested the patient come into our office and obtain a chest x-ray to further evaluate dyspnea.  Chest x-ray results are listed below:  10/15/2019-chest x-ray-mild interstitial prominence could reflect bronchitis or treatment related changes and contributed the patient symptoms, interval improvement in the previous I demonstrated left perihilar mass and adenopathy consistent with response to therapy  Most recent CBC in chart from 6 days ago shows a hemoglobin of 7.8  Last office visit with oncology with Dr. Earlie Server was on 09/25/2019.  Assessment and plan from that office visit is listed below:   ASSESSMENT AND PLAN: This is a very pleasant 79 years old [name] with stage IV non-small cell lung cancer, squamous cell carcinoma with negative PD-L1 expression. The patient is currently undergoing systemic chemotherapy with carboplatin and paclitaxel for 2 cycles in addition to immunotherapy with  ipilimumab 1 mg/KG every 6 weeks and nivolumab 360 mg IV every 3 weeks status post day 1 of cycle #1.  He is here today for day #22 of cycle #1. The patient tolerated the first cycle of his treatment fairly well. He had palliative radiotherapy to the left lung mass under the care of Dr. Lisbeth Renshaw and he is complaining of odynophagia. He had a PET scan performed recently.  I discussed the scan results with the patient and his family.  His PET scan showed no concerning findings for disease metastasis outside the chest.  Has some rectal wall thickening questionable for proctitis.  I would consider referring the patient to gastroenterology in the future for evaluation of this condition after he complete the cycle of the chemotherapy. I recommended for the patient to proceed with day #22 of cycle #1 today as planned. For the odynophagia, I will start the patient on Carafate every 6 hours. For the constipation he was advised to continue with the MiraLAX and add milk of magnesia if needed. For the malnutrition and weight loss, he was encouraged to increase his oral intake and hopefully he will eat better after using Carafate. The patient will come back for follow-up visit in 3 weeks for evaluation before starting day 1 of cycle #2. His daughters had a lot of questions and I answered them completely to their satisfaction. He was advised to call immediately if he has any concerning symptoms in the interval. The patient voices understanding of current disease status and treatment options and is in agreement with the current care plan.  Patient presenting to office today reporting increased shortness  of breath as well as cough with clear mucus.  He is not on any maintenance inhalers.  He has not been scheduled yet for pulmonary function test.  Questionaires / Pulmonary Flowsheets:   ACT:  No flowsheet data found.  MMRC: No flowsheet data found.  Epworth:  No flowsheet data found.  Tests:   08/16/2019-CT  chest with contrast-6.7 x 4.8 cm heterogeneous low-attenuation left lower lobe lung mass consistent with primary lung malignancy  10/09/2019-CBC with differential-hemoglobin 7.8  09/13/2019-PET scan-large patchy necrotic left lower lobe mass is hypermetabolic and consistent with known lung neoplasm, extensive mediastinal and hilar metastatic adenopathy and extensive pulmonary metastatic nodules, hypermetabolic right supraclavicular lymph nodes  10/15/2019-chest x-ray-mild interstitial prominence could reflect bronchitis or treatment-related changes and contribute to the patient's symptoms, interval improvement in the previously demonstrated left perihilar mass and adenopathy consistent with response to therapy   FENO:  No results found for: NITRICOXIDE  PFT: No flowsheet data found.  WALK:  SIX MIN WALK 10/15/2019 08/27/2019  Supplimental Oxygen during Test? (L/min) No No  Tech Comments: - Average pace with no desats. Tolerated walk well.    Imaging: DG Chest 2 View  Result Date: 10/15/2019 CLINICAL DATA:  Shortness of breath.  Non-small cell lung cancer. EXAM: CHEST - 2 VIEW COMPARISON:  PET-CT 09/13/2019, chest radiograph 08/20/2019 and chest CT 08/16/2019. FINDINGS: New right IJ Port-A-Cath extends to the level of the mid SVC. Interval improved mediastinal and hilar adenopathy. The posterior left hilar and superior segment left lower lobe mass has decreased in size. No other definite residual visible pulmonary nodules. There is mild interstitial prominence and mild central airway thickening. No pleural effusion or pneumothorax. The bones appear unremarkable. IMPRESSION: 1. Mild interstitial prominence could reflect bronchitis or treatment-related changes and contribute to the patient's symptoms. 2. Interval improvement in previously demonstrated left perihilar mass and adenopathy consistent with response to therapy. Electronically Signed   By: Richardean Sale M.D.   On: 10/15/2019 15:01    Lab  Results:  CBC    Component Value Date/Time   WBC 9.5 10/09/2019 1217   WBC 6.8 09/09/2019 0253   RBC 2.72 (L) 10/09/2019 1217   HGB 7.8 (L) 10/09/2019 1217   HGB 9.8 (L) 08/14/2019 0918   HCT 24.3 (L) 10/09/2019 1217   HCT 30.1 (L) 08/14/2019 0918   PLT 265 10/09/2019 1217   PLT 549 (H) 08/14/2019 0918   MCV 89.3 10/09/2019 1217   MCV 87 08/14/2019 0918   MCH 28.7 10/09/2019 1217   MCHC 32.1 10/09/2019 1217   RDW 14.7 10/09/2019 1217   RDW 11.8 08/14/2019 0918   LYMPHSABS 1.2 10/09/2019 1217   LYMPHSABS 1.8 08/14/2019 0918   MONOABS 0.9 10/09/2019 1217   EOSABS 0.1 10/09/2019 1217   EOSABS 0.3 08/14/2019 0918   BASOSABS 0.0 10/09/2019 1217   BASOSABS 0.0 08/14/2019 0918    BMET    Component Value Date/Time   NA 136 10/09/2019 1217   NA 132 (L) 08/14/2019 0918   K 4.3 10/09/2019 1217   CL 100 10/09/2019 1217   CO2 29 10/09/2019 1217   GLUCOSE 95 10/09/2019 1217   BUN 16 10/09/2019 1217   BUN 16 08/14/2019 0918   CREATININE 0.84 10/09/2019 1217   CREATININE 1.25 (H) 08/08/2016 0912   CALCIUM 9.9 10/09/2019 1217   GFRNONAA >60 10/09/2019 1217   GFRAA >60 10/09/2019 1217    BNP No results found for: BNP  ProBNP No results found for: PROBNP  Specialty Problems  Pulmonary Problems   Allergic rhinitis, mild   Malignant neoplasm of lower lobe of left lung (HCC)   Shortness of breath      No Known Allergies  Immunization History  Administered Date(s) Administered  . Fluad Quad(high Dose 65+) 10/17/2018  . Influenza, High Dose Seasonal PF 12/02/2015, 12/12/2016, 11/02/2017  . PFIZER SARS-COV-2 Vaccination 04/05/2019, 05/01/2019  . Pneumococcal Conjugate-13 06/25/2014  . Pneumococcal Polysaccharide-23 06/03/2010  . Tdap 12/06/2007  . Zoster 02/08/2008  . Zoster Recombinat (Shingrix) 02/25/2017, 04/26/2017    Past Medical History:  Diagnosis Date  . Allergic rhinitis   . Asthma   . Carotid stenosis   . Colon cancer ([place]) 2003  . Diabetes  mellitus ([place])   . Diverticulosis   . Dyslipidemia   . ED (erectile dysfunction)   . GERD (gastroesophageal reflux disease)   . H/O degenerative disc disease   . Hemorrhoids   . HTN (hypertension)   . Hyperlipidemia   . LVH (left ventricular hypertrophy)    on EKG  . Mass of lower lobe of left lung   . Mediastinal adenopathy   . Smoker    former  . Wears dentures   . Wears glasses     Tobacco History: Social History   Tobacco Use  Smoking Status Former Smoker  . Packs/day: 1.00  . Years: 32.00  . Pack years: 32.00  . Start date: 02/08/1955  . Quit date: 10/09/1987  . Years since quitting: 32.0  Smokeless Tobacco Never Used   Counseling given: Yes   Continue to not smoke  Outpatient Encounter Medications as of 10/15/2019  Medication Sig  . amLODipine (NORVASC) 5 MG tablet TAKE 1 TABLET BY MOUTH EVERY DAY  . aspirin EC 81 MG tablet Take 81 mg by mouth daily after breakfast.   . Blood Glucose Monitoring Suppl (ONETOUCH VERIO) w/Device KIT USE TO CHECK SUGAR DAILY  . Blood Glucose Monitoring Suppl (ONETOUCH VERIO) w/Device KIT USE TO CHECK SUGAR DAILY  . cholecalciferol (VITAMIN D) 1000 units tablet Take 1,000 Units by mouth daily after breakfast.   . docusate sodium (COLACE) 100 MG capsule Take 1 capsule (100 mg total) by mouth 2 (two) times daily as needed for mild constipation.  . ferrous sulfate 325 (65 FE) MG EC tablet Take 1 tablet (325 mg total) by mouth daily with breakfast.  . guaiFENesin-codeine (ROBITUSSIN AC) 100-10 MG/5ML syrup Take 5 mLs by mouth 3 (three) times daily as needed for cough.  . Lancets (ONETOUCH DELICA PLUS [id number]) MISC PATIENT IS TO TEST TWO TIMES A DAY DX: E11.9 (ONETOUCH VERIO FLEX)  . lidocaine-prilocaine (EMLA) cream Apply to the Port-A-Cath site 30-60-minute before chemotherapy.  Marland Kitchen lisinopril-hydrochlorothiazide (ZESTORETIC) 20-12.5 MG tablet Take 1 tablet by mouth daily. (Patient taking differently: Take 1 tablet by mouth daily after  breakfast. )  . LORazepam (ATIVAN) 0.5 MG tablet 1 tablet 30 minutes before the MRI.  Repeat once if needed.  . metFORMIN (GLUCOPHAGE-XR) 500 MG 24 hr tablet TAKE 1 TABLET BY MOUTH EVERY DAY (Patient taking differently: Take 500 mg by mouth daily after breakfast. )  . mirabegron ER (MYRBETRIQ) 50 MG TB24 tablet Take 1 tablet (50 mg total) by mouth daily.  . mirtazapine (REMERON) 15 MG tablet TAKE 1 TABLET BY MOUTH EVERYDAY AT BEDTIME  . Multiple Vitamin (MULTIVITAMIN WITH MINERALS) TABS tablet Take 1 tablet by mouth daily after breakfast.   . ONETOUCH VERIO test strip USE AS INSTRUCTED TO CHECK TWICE DAILY  . prochlorperazine (COMPAZINE) 10 MG tablet  Take 1 tablet (10 mg total) by mouth every 6 (six) hours as needed for nausea or vomiting.  . simvastatin (ZOCOR) 40 MG tablet Take 1 tablet (40 mg total) by mouth daily. (Patient taking differently: Take 40 mg by mouth every evening. )  . sucralfate (CARAFATE) 1 GM/10ML suspension Take 10 mLs (1 g total) by mouth 4 (four) times daily -  with meals and at bedtime.  Marland Kitchen doxycycline (VIBRA-TABS) 100 MG tablet Take 1 tablet (100 mg total) by mouth 2 (two) times daily.  . predniSONE (DELTASONE) 10 MG tablet 4 tabs for 2 days, then 3 tabs for 2 days, 2 tabs for 2 days, then 1 tab for 2 days, then stop  . Tiotropium Bromide-Olodaterol (STIOLTO RESPIMAT) 2.5-2.5 MCG/ACT AERS Inhale 2 puffs into the lungs daily.   Facility-Administered Encounter Medications as of 10/15/2019  Medication  . sodium chloride flush (NS) 0.9 % injection 10 mL     Review of Systems  Review of Systems  Constitutional: Positive for fatigue. Negative for activity change, chills, fever and unexpected weight change.  HENT: Positive for congestion (clear). Negative for postnasal drip, rhinorrhea, sinus pressure, sinus pain and sore throat.   Eyes: Negative.   Respiratory: Positive for cough, shortness of breath and wheezing.   Cardiovascular: Negative for chest pain and palpitations.    Gastrointestinal: Negative for constipation, diarrhea, nausea and vomiting.  Endocrine: Negative.   Genitourinary: Negative.   Musculoskeletal: Negative.   Skin: Negative.   Neurological: Negative for dizziness and headaches.  Psychiatric/Behavioral: Negative.  Negative for dysphoric mood. The patient is not nervous/anxious.   All other systems reviewed and are negative.    Physical Exam  BP 122/62 (BP Location: Left Arm, Cuff Size: Normal)   Pulse 93   Temp (!) 97.3 F (36.3 C) (Oral)   Ht 5' 7.5" (1.715 m)   Wt 157 lb 1.6 oz (71.3 kg)   SpO2 97%   BMI 24.24 kg/m   Wt Readings from Last 5 Encounters:  10/15/19 157 lb 1.6 oz (71.3 kg)  09/25/19 148 lb 12.8 oz (67.5 kg)  09/23/19 152 lb (68.9 kg)  09/17/19 152 lb 6.4 oz (69.1 kg)  09/09/19 156 lb 14.4 oz (71.2 kg)    BMI Readings from Last 5 Encounters:  10/15/19 24.24 kg/m  09/25/19 23.31 kg/m  09/23/19 23.81 kg/m  09/17/19 23.87 kg/m  09/09/19 24.57 kg/m     Physical Exam Vitals and nursing note reviewed.  Constitutional:      General: He is not in acute distress.    Appearance: Normal appearance. He is obese.  HENT:     Head: Normocephalic and atraumatic.     Right Ear: Hearing and external ear normal.     Left Ear: Hearing and external ear normal.     Nose: Nose normal. No mucosal edema or rhinorrhea.     Right Turbinates: Not enlarged.     Left Turbinates: Not enlarged.     Mouth/Throat:     Mouth: Mucous membranes are dry.     Pharynx: Oropharynx is clear. No oropharyngeal exudate.  Eyes:     Pupils: Pupils are equal, round, and reactive to light.  Cardiovascular:     Rate and Rhythm: Normal rate and regular rhythm.     Pulses: Normal pulses.     Heart sounds: Normal heart sounds. No murmur heard.   Pulmonary:     Effort: Pulmonary effort is normal.     Breath sounds: Stridor present. No decreased breath  sounds, wheezing or rales.     Comments: Diminished breath sounds throughout  exam Musculoskeletal:     Cervical back: Normal range of motion.     Right lower leg: No edema.     Left lower leg: No edema.  Lymphadenopathy:     Cervical: No cervical adenopathy.  Skin:    General: Skin is warm and dry.     Capillary Refill: Capillary refill takes less than 2 seconds.     Findings: No erythema or rash.  Neurological:     General: No focal deficit present.     Mental Status: He is alert and oriented to person, place, and time.     Motor: No weakness.     Coordination: Coordination normal.     Gait: Gait is intact. Gait normal.  Psychiatric:        Mood and Affect: Mood normal.        Behavior: Behavior normal. Behavior is cooperative.        Thought Content: Thought content normal.        Judgment: Judgment normal.       Assessment & Plan:   Malignant neoplasm of lower lobe of left lung (HCC) Plan: Keep follow-up with oncology tomorrow We will route notes to Dr. Earlie Server   Shortness of breath Suspect shortness of breath could be multifactorial given ongoing oncology treatments, known left lower lobe malignant neoplasm currently being treated by oncology, hemoglobin is 7.8, suspected COPD  Clinical exam with stridor today.  Chest x-ray potentially showing bronchitis  Plan: Keep upcoming appointment with oncology Discussed hemoglobin levels with oncology tomorrow Prednisone taper today Doxycycline today Start Stiolto We will schedule follow-up with pulmonary function testing and 4 to 6 weeks    Return in about 6 weeks (around 11/26/2019), or if symptoms worsen or fail to improve, for Follow up with Dr. Lamonte Sakai, Follow up for FULL PFT - 60 min.   Lauraine Rinne, NP 10/15/2019   This appointment required 32 minutes of patient care (this includes precharting, chart review, review of results, face-to-face care, etc.).

## 2019-10-15 NOTE — Telephone Encounter (Signed)
We need to see him as soon as we can get him in with a CXR; either app or RB-   there are multiple potential causes for his dyspnea given his new dx lung cancer, at risk for effusion, PE, progressive post-obstruction, etc. I also believe he has COPD. Can try empiric long-acting BD when we see him.

## 2019-10-15 NOTE — Telephone Encounter (Signed)
Primary Pulmonologist: Dr. Lamonte Sakai Last office visit and with whom: Shaun Kennedy 08/27/19 What do we see them for (pulmonary problems): Mass of left lung Last OV assessment/plan: SEE BELOW  Was appointment offered to patient (explain)?  Yes told patient I would check with provider first   Reason for call: Called and spoke with patient he states that he been having trouble breathing for the last month but states that it got worse Saturday/Sunday. He has a cough with clear sputum, no fever. Does not take any inhalers or nebulizer's. Patient does sound like he is working to breath over phone There are some openings with APP today Dr. Lamonte Sakai Please advise  (examples of things to ask: : When did symptoms start? Fever? Cough? Productive? Color to sputum? More sputum than usual? Wheezing? Have you needed increased oxygen? Are you taking your respiratory medications? What over the counter measures have you tried?)  No Known Allergies  Immunization History  Administered Date(s) Administered  . Fluad Quad(high Dose 65+) 10/17/2018  . Influenza, High Dose Seasonal PF 12/02/2015, 12/12/2016, 11/02/2017  . PFIZER SARS-COV-2 Vaccination 04/05/2019, 05/01/2019  . Pneumococcal Conjugate-13 06/25/2014  . Pneumococcal Polysaccharide-23 06/03/2010  . Tdap 12/06/2007  . Zoster 02/08/2008  . Zoster Recombinat (Shingrix) 02/25/2017, 04/26/2017   Assessment & Plan:   Former smoker, stopped smoking in distant past History of 32-pack-year smoker Stop smoking 1989  Plan: We will obtain pulmonary function testing  Mass of left lung Left lower lobe lung mass Diagnosis squamous cell carcinoma Followed by Dr. Earlie Server  Plan: Continue follow-up with oncology We will order pulmonary function testing to be performed sometime over the next 2 to 3 months 79-month follow-up with Dr. Lamonte Sakai Walk today in office, patient completed 3 laps with no oxygen desaturations on room air    Return in about 3 months  (around 11/27/2019), or if symptoms worsen or fail to improve, for Follow up with Dr. Lamonte Sakai, Follow up for FULL PFT - 60 min.   Lauraine Rinne, NP 08/27/2019

## 2019-10-15 NOTE — Assessment & Plan Note (Signed)
Plan: Keep follow-up with oncology tomorrow We will route notes to Dr. Earlie Server

## 2019-10-15 NOTE — Telephone Encounter (Signed)
Called and spoke with pt letting him know the info stated by RB and he verbalized understanding. Pt has been scheduled an appt at 3pm today 9/7 with Aaron Edelman and pt told to arrive around 2:30 for chest xray prior to OV. Nothing further needed.

## 2019-10-16 ENCOUNTER — Inpatient Hospital Stay: Payer: Medicare HMO

## 2019-10-16 ENCOUNTER — Other Ambulatory Visit: Payer: Self-pay

## 2019-10-16 ENCOUNTER — Other Ambulatory Visit: Payer: Self-pay | Admitting: Medical Oncology

## 2019-10-16 ENCOUNTER — Encounter: Payer: Self-pay | Admitting: Internal Medicine

## 2019-10-16 ENCOUNTER — Inpatient Hospital Stay: Payer: Medicare HMO | Admitting: Internal Medicine

## 2019-10-16 VITALS — BP 114/60 | HR 74 | Temp 98.4°F | Resp 18

## 2019-10-16 VITALS — BP 110/56 | HR 59 | Temp 97.8°F | Resp 18 | Ht 67.0 in | Wt 156.0 lb

## 2019-10-16 DIAGNOSIS — C3432 Malignant neoplasm of lower lobe, left bronchus or lung: Secondary | ICD-10-CM | POA: Diagnosis not present

## 2019-10-16 DIAGNOSIS — Z5112 Encounter for antineoplastic immunotherapy: Secondary | ICD-10-CM | POA: Diagnosis not present

## 2019-10-16 DIAGNOSIS — D6481 Anemia due to antineoplastic chemotherapy: Secondary | ICD-10-CM

## 2019-10-16 DIAGNOSIS — Z95828 Presence of other vascular implants and grafts: Secondary | ICD-10-CM

## 2019-10-16 DIAGNOSIS — T451X5A Adverse effect of antineoplastic and immunosuppressive drugs, initial encounter: Secondary | ICD-10-CM

## 2019-10-16 LAB — CBC WITH DIFFERENTIAL (CANCER CENTER ONLY)
Abs Immature Granulocytes: 0.03 10*3/uL (ref 0.00–0.07)
Basophils Absolute: 0 10*3/uL (ref 0.0–0.1)
Basophils Relative: 0 %
Eosinophils Absolute: 0.1 10*3/uL (ref 0.0–0.5)
Eosinophils Relative: 1 %
HCT: 24.7 % — ABNORMAL LOW (ref 39.0–52.0)
Hemoglobin: 8.1 g/dL — ABNORMAL LOW (ref 13.0–17.0)
Immature Granulocytes: 0 %
Lymphocytes Relative: 17 %
Lymphs Abs: 1.2 10*3/uL (ref 0.7–4.0)
MCH: 29.6 pg (ref 26.0–34.0)
MCHC: 32.8 g/dL (ref 30.0–36.0)
MCV: 90.1 fL (ref 80.0–100.0)
Monocytes Absolute: 0.8 10*3/uL (ref 0.1–1.0)
Monocytes Relative: 12 %
Neutro Abs: 4.8 10*3/uL (ref 1.7–7.7)
Neutrophils Relative %: 70 %
Platelet Count: 294 10*3/uL (ref 150–400)
RBC: 2.74 MIL/uL — ABNORMAL LOW (ref 4.22–5.81)
RDW: 16.7 % — ABNORMAL HIGH (ref 11.5–15.5)
WBC Count: 7 10*3/uL (ref 4.0–10.5)
nRBC: 0 % (ref 0.0–0.2)

## 2019-10-16 LAB — CMP (CANCER CENTER ONLY)
ALT: 13 U/L (ref 0–44)
AST: 16 U/L (ref 15–41)
Albumin: 3.2 g/dL — ABNORMAL LOW (ref 3.5–5.0)
Alkaline Phosphatase: 66 U/L (ref 38–126)
Anion gap: 5 (ref 5–15)
BUN: 15 mg/dL (ref 8–23)
CO2: 29 mmol/L (ref 22–32)
Calcium: 8.9 mg/dL (ref 8.9–10.3)
Chloride: 102 mmol/L (ref 98–111)
Creatinine: 0.91 mg/dL (ref 0.61–1.24)
GFR, Est AFR Am: >60 mL/min (ref >60)
GFR, Estimated: >60 mL/min (ref >60)
Glucose, Bld: 115 mg/dL — ABNORMAL HIGH (ref 70–99)
Potassium: 4.3 mmol/L (ref 3.5–5.1)
Sodium: 136 mmol/L (ref 135–145)
Total Bilirubin: 0.4 mg/dL (ref 0.3–1.2)
Total Protein: 7.3 g/dL (ref 6.5–8.1)

## 2019-10-16 LAB — ABO/RH: ABO/RH(D): O POS

## 2019-10-16 LAB — TSH: TSH: 1.054 u[IU]/mL (ref 0.320–4.118)

## 2019-10-16 LAB — PREPARE RBC (CROSSMATCH)

## 2019-10-16 MED ORDER — DIPHENHYDRAMINE HCL 50 MG/ML IJ SOLN
INTRAMUSCULAR | Status: AC
  Filled 2019-10-16: qty 1

## 2019-10-16 MED ORDER — SODIUM CHLORIDE 0.9 % IV SOLN
1.0000 mg/kg | Freq: Once | INTRAVENOUS | Status: AC
  Administered 2019-10-16: 70 mg via INTRAVENOUS
  Filled 2019-10-16: qty 14

## 2019-10-16 MED ORDER — SODIUM CHLORIDE 0.9 % IV SOLN
Freq: Once | INTRAVENOUS | Status: AC
  Filled 2019-10-16: qty 250

## 2019-10-16 MED ORDER — SODIUM CHLORIDE 0.9 % IV SOLN
360.0000 mg | Freq: Once | INTRAVENOUS | Status: AC
  Administered 2019-10-16: 360 mg via INTRAVENOUS
  Filled 2019-10-16: qty 24

## 2019-10-16 MED ORDER — ACETAMINOPHEN 325 MG PO TABS
650.0000 mg | ORAL_TABLET | Freq: Once | ORAL | Status: AC
  Administered 2019-10-16: 650 mg via ORAL

## 2019-10-16 MED ORDER — HEPARIN SOD (PORK) LOCK FLUSH 100 UNIT/ML IV SOLN
500.0000 [IU] | Freq: Once | INTRAVENOUS | Status: AC | PRN
  Administered 2019-10-16: 500 [IU]
  Filled 2019-10-16: qty 5

## 2019-10-16 MED ORDER — SODIUM CHLORIDE 0.9% IV SOLUTION
250.0000 mL | Freq: Once | INTRAVENOUS | Status: AC
  Administered 2019-10-16: 250 mL via INTRAVENOUS
  Filled 2019-10-16: qty 250

## 2019-10-16 MED ORDER — SODIUM CHLORIDE 0.9% FLUSH
10.0000 mL | INTRAVENOUS | Status: DC | PRN
  Administered 2019-10-16: 10 mL
  Filled 2019-10-16: qty 10

## 2019-10-16 MED ORDER — FAMOTIDINE IN NACL 20-0.9 MG/50ML-% IV SOLN
INTRAVENOUS | Status: AC
  Filled 2019-10-16: qty 50

## 2019-10-16 MED ORDER — SODIUM CHLORIDE 0.9% FLUSH
10.0000 mL | Freq: Once | INTRAVENOUS | Status: AC
  Administered 2019-10-16: 10 mL
  Filled 2019-10-16: qty 10

## 2019-10-16 MED ORDER — ACETAMINOPHEN 325 MG PO TABS
ORAL_TABLET | ORAL | Status: AC
  Filled 2019-10-16: qty 2

## 2019-10-16 MED ORDER — FAMOTIDINE IN NACL 20-0.9 MG/50ML-% IV SOLN
20.0000 mg | Freq: Once | INTRAVENOUS | Status: AC
  Administered 2019-10-16: 20 mg via INTRAVENOUS

## 2019-10-16 MED ORDER — DIPHENHYDRAMINE HCL 50 MG/ML IJ SOLN
25.0000 mg | Freq: Once | INTRAMUSCULAR | Status: AC
  Administered 2019-10-16: 25 mg via INTRAVENOUS

## 2019-10-16 MED ORDER — DIPHENHYDRAMINE HCL 25 MG PO CAPS: 25.0000 mg | ORAL_CAPSULE | Freq: Once | ORAL | Status: DC

## 2019-10-16 NOTE — Progress Notes (Signed)
Nutrition Follow-up:  Patient with stage IV non small cell lung cancer.  Patient receiving yervoy and opdivo at this time.    Met with patient during infusion.  Patient reports that he is eating more than before.  Reports yesterday able to drinks ensure plus, smoothie (mixed with ensure plus, fruit, protein powder). Ate chicken breast (50%), 100% of corn and mashed potatoes at [employer] for dinner last night and roll.  Reports swallowing is still an issue but tolerable.  Patient coughing after talking.      Medications: reviewed  Labs: reviewed  Anthropometrics:   Weight 156 lb increased from 148 lb 12.8 oz on 8/18  155 lb on 7/27   NUTRITION DIAGNOSIS: Inadequate oral intake continues   INTERVENTION:  Patient to continue eating high calorie, high protein foods.  Continue with oral nutrition supplements and smoothies for added calories Patient has contact information    MONITORING, EVALUATION, GOAL: weight trends, intake   NEXT VISIT: as needed with treatment  Shaun Kennedy, [place], [place] Registered Dietitian [phone] (mobile)

## 2019-10-16 NOTE — Patient Instructions (Signed)
[facility] Discharge Instructions for Patients Receiving Chemotherapy  Today you received the following chemotherapy agents: nivolumab (opdivo) and ipilimumab (yervoy).  To help prevent nausea and vomiting after your treatment, we encourage you to take your nausea medication as directed.   If you develop nausea and vomiting that is not controlled by your nausea medication, call the clinic.   BELOW ARE SYMPTOMS THAT SHOULD BE REPORTED IMMEDIATELY:  *FEVER GREATER THAN 100.5 F  *CHILLS WITH OR WITHOUT FEVER  NAUSEA AND VOMITING THAT IS NOT CONTROLLED WITH YOUR NAUSEA MEDICATION  *UNUSUAL SHORTNESS OF BREATH  *UNUSUAL BRUISING OR BLEEDING  TENDERNESS IN MOUTH AND THROAT WITH OR WITHOUT PRESENCE OF ULCERS  *URINARY PROBLEMS  *BOWEL PROBLEMS  UNUSUAL RASH Items with * indicate a potential emergency and should be followed up as soon as possible.  Feel free to call the clinic should you have any questions or concerns. The clinic phone number is [phone].  Please show the [facility] at check-in to the Emergency Department and triage nurse.

## 2019-10-16 NOTE — Progress Notes (Signed)
[place] [phone]   [phone]  OFFICE PROGRESS NOTE  Shaun Kennedy, [place] [address]  DIAGNOSIS: stage IV (T3, N2, M1 a) non-small cell Kennedy cancer, squamous cell carcinoma presented with obstructive left lower lobe Kennedy mass in addition to mediastinal lymphadenopathy as well as bilateral pulmonary nodules diagnosed in July 2021.  PDL1: 0%  PRIOR THERAPY: None  CURRENT THERAPY: 1) Palliative radiotherapy to the obstructive left lower lobe Kennedy mass under the care of Dr. Lisbeth Renshaw.  2)  systemic chemotherapy 2 cycles of chemotherapy with carboplatin for an AUC of 5 and paclitaxel 175 mg/m2 in addition to immunotherapy with nivolumab 360 mg every 3 weeks and ipilimumab 1 mg/kg IV every 6 weeks followed by maintenance nivolumab and ipilimumab.  He started the first treatment on 09/04/2019.  INTERVAL HISTORY: [name] 79 y.o. male returns to the clinic today for follow-up visit accompanied by his wife.  The patient completed the first cycle of his treatment with a combination of chemotherapy as well as ipilimumab and nivolumab fairly well.  He has mild fatigue.  He denied having any chest pain but has shortness of breath with exertion with no cough or hemoptysis.  He denied having any weight loss or night sweats.  He has no nausea, vomiting, diarrhea or constipation.  He denied having any headache or visual changes.  He is here today for evaluation before starting cycle #2.   MEDICAL HISTORY: Past Medical History:  Diagnosis Date  . Allergic rhinitis   . Asthma   . Carotid stenosis   . Colon cancer ([place]) 2003  . Diabetes mellitus ([place])   . Diverticulosis   . Dyslipidemia   . ED (erectile dysfunction)   . GERD (gastroesophageal reflux disease)   . H/O degenerative disc disease   . Hemorrhoids   . HTN (hypertension)   . Hyperlipidemia   . LVH (left ventricular hypertrophy)    on EKG  . Mass of lower  lobe of left Kennedy   . Mediastinal adenopathy   . Smoker    former  . Wears dentures   . Wears glasses     ALLERGIES:  has No Known Allergies.  MEDICATIONS:  Current Outpatient Medications  Medication Sig Dispense Refill  . amLODipine (NORVASC) 5 MG tablet TAKE 1 TABLET BY MOUTH EVERY DAY 90 tablet 3  . aspirin EC 81 MG tablet Take 81 mg by mouth daily after breakfast.     . Blood Glucose Monitoring Suppl (ONETOUCH VERIO) w/Device KIT USE TO CHECK SUGAR DAILY 1 kit 0  . Blood Glucose Monitoring Suppl (ONETOUCH VERIO) w/Device KIT USE TO CHECK SUGAR DAILY 1 kit 0  . cholecalciferol (VITAMIN D) 1000 units tablet Take 1,000 Units by mouth daily after breakfast.     . docusate sodium (COLACE) 100 MG capsule Take 1 capsule (100 mg total) by mouth 2 (two) times daily as needed for mild constipation. 15 capsule 0  . doxycycline (VIBRA-TABS) 100 MG tablet Take 1 tablet (100 mg total) by mouth 2 (two) times daily. 14 tablet 0  . ferrous sulfate 325 (65 FE) MG EC tablet Take 1 tablet (325 mg total) by mouth daily with breakfast. 30 tablet 12  . guaiFENesin-codeine (ROBITUSSIN AC) 100-10 MG/5ML syrup Take 5 mLs by mouth 3 (three) times daily as needed for cough. 240 mL 0  . Lancets (ONETOUCH DELICA PLUS [id number]) MISC PATIENT IS TO TEST TWO TIMES A DAY DX:  E11.9 (ONETOUCH VERIO FLEX) 100 each 12  . lidocaine-prilocaine (EMLA) cream Apply to the Port-A-Cath site 30-60-minute before chemotherapy. 30 g 0  . lisinopril-hydrochlorothiazide (ZESTORETIC) 20-12.5 MG tablet Take 1 tablet by mouth daily. (Patient taking differently: Take 1 tablet by mouth daily after breakfast. ) 90 tablet 2  . LORazepam (ATIVAN) 0.5 MG tablet 1 tablet 30 minutes before the MRI.  Repeat once if needed. 2 tablet 0  . metFORMIN (GLUCOPHAGE-XR) 500 MG 24 hr tablet TAKE 1 TABLET BY MOUTH EVERY DAY (Patient taking differently: Take 500 mg by mouth daily after breakfast. ) 90 tablet 1  . mirabegron ER (MYRBETRIQ) 50 MG TB24 tablet  Take 1 tablet (50 mg total) by mouth daily. 14 tablet 0  . mirtazapine (REMERON) 15 MG tablet TAKE 1 TABLET BY MOUTH EVERYDAY AT BEDTIME 90 tablet 1  . Multiple Vitamin (MULTIVITAMIN WITH MINERALS) TABS tablet Take 1 tablet by mouth daily after breakfast.     . ONETOUCH VERIO test strip USE AS INSTRUCTED TO CHECK TWICE DAILY 200 strip 5  . predniSONE (DELTASONE) 10 MG tablet 4 tabs for 2 days, then 3 tabs for 2 days, 2 tabs for 2 days, then 1 tab for 2 days, then stop 20 tablet 0  . prochlorperazine (COMPAZINE) 10 MG tablet Take 1 tablet (10 mg total) by mouth every 6 (six) hours as needed for nausea or vomiting. 30 tablet 0  . simvastatin (ZOCOR) 40 MG tablet Take 1 tablet (40 mg total) by mouth daily. (Patient taking differently: Take 40 mg by mouth every evening. ) 90 tablet 3  . sucralfate (CARAFATE) 1 GM/10ML suspension Take 10 mLs (1 g total) by mouth 4 (four) times daily -  with meals and at bedtime. 420 mL 0  . Tiotropium Bromide-Olodaterol (STIOLTO RESPIMAT) 2.5-2.5 MCG/ACT AERS Inhale 2 puffs into the lungs daily. 4 g 0   No current facility-administered medications for this visit.   Facility-Administered Medications Ordered in Other Visits  Medication Dose Route Frequency Provider Last Rate Last Admin  . sodium chloride flush (NS) 0.9 % injection 10 mL  10 mL Intracatheter PRN Curt Bears, MD   10 mL at 09/04/19 1556    SURGICAL HISTORY:  Past Surgical History:  Procedure Laterality Date  . BRONCHIAL BIOPSY  08/20/2019   Procedure: BRONCHIAL BIOPSIES;  Surgeon: Collene Gobble, MD;  Location: [facility] ENDOSCOPY;  Service: Pulmonary;;  . BRONCHIAL BRUSHINGS  08/20/2019   Procedure: BRONCHIAL BRUSHINGS;  Surgeon: Collene Gobble, MD;  Location: [facility] ENDOSCOPY;  Service: Pulmonary;;  . BRONCHIAL NEEDLE ASPIRATION BIOPSY  08/20/2019   Procedure: BRONCHIAL NEEDLE ASPIRATION BIOPSIES;  Surgeon: Collene Gobble, MD;  Location: [facility] ENDOSCOPY;  Service: Pulmonary;;  . CARPAL TUNNEL RELEASE Right  01/09/2019   Procedure: CARPAL TUNNEL RELEASE;  Surgeon: Leandrew Koyanagi, MD;  Location: [facility];  Service: Orthopedics;  Laterality: Right;  . CATARACT EXTRACTION Right 2018  . COLONOSCOPY  2007   Dr. Benson Norway  . I & D EXTREMITY Right 04/02/2016   Procedure: IRRIGATION AND DEBRIDEMENT GREAT TOE;  Surgeon: Leandrew Koyanagi, MD;  Location: [facility];  Service: Orthopedics;  Laterality: Right;  . IR IMAGING GUIDED PORT INSERTION  08/30/2019  . MULTIPLE TOOTH EXTRACTIONS    . RADIOACTIVE SEED IMPLANT  2003  . ULNAR TUNNEL RELEASE Right 01/09/2019   Procedure: RIGHT CUBITAL TUNNEL RELEASE AND CARPAL TUNNEL RELEASE;  Surgeon: Leandrew Koyanagi, MD;  Location: [facility];  Service: Orthopedics;  Laterality: Right;  .  UPPER GASTROINTESTINAL ENDOSCOPY    . VIDEO BRONCHOSCOPY WITH ENDOBRONCHIAL ULTRASOUND N/A 08/20/2019   Procedure: VIDEO BRONCHOSCOPY WITH ENDOBRONCHIAL ULTRASOUND;  Surgeon: Collene Gobble, MD;  Location: [facility] ENDOSCOPY;  Service: Pulmonary;  Laterality: N/A;    REVIEW OF SYSTEMS:  Constitutional: positive for fatigue Eyes: negative Ears, nose, mouth, throat, and face: negative Respiratory: positive for dyspnea on exertion Cardiovascular: negative Gastrointestinal: positive for constipation Genitourinary:negative Integument/breast: negative Hematologic/lymphatic: negative Musculoskeletal:negative Neurological: negative Behavioral/Psych: negative Endocrine: negative Allergic/Immunologic: negative   PHYSICAL EXAMINATION: General appearance: alert, cooperative, fatigued and no distress Head: Normocephalic, without obvious abnormality, atraumatic Neck: no adenopathy, no JVD, supple, symmetrical, trachea midline and thyroid not enlarged, symmetric, no tenderness/mass/nodules Lymph nodes: Cervical, supraclavicular, and axillary nodes normal. Resp: clear to auscultation bilaterally Back: symmetric, no curvature. ROM normal. No CVA tenderness. Cardio: regular rate and  rhythm, S1, S2 normal, no murmur, click, rub or gallop GI: soft, non-tender; bowel sounds normal; no masses,  no organomegaly Extremities: extremities normal, atraumatic, no cyanosis or edema Neurologic: Alert and oriented X 3, normal strength and tone. Normal symmetric reflexes. Normal coordination and gait  ECOG PERFORMANCE STATUS: 1 - Symptomatic but completely ambulatory  Blood pressure (!) 110/56, pulse (!) 59, temperature 97.8 F (36.6 C), temperature source Tympanic, resp. rate 18, height '5\' 7"'  (1.702 m), weight 156 lb (70.8 kg), SpO2 97 %.  LABORATORY DATA: Lab Results  Component Value Date   WBC 7.0 10/16/2019   HGB 8.1 (L) 10/16/2019   HCT 24.7 (L) 10/16/2019   MCV 90.1 10/16/2019   PLT 294 10/16/2019      Chemistry      Component Value Date/Time   NA 136 10/09/2019 1217   NA 132 (L) 08/14/2019 0918   K 4.3 10/09/2019 1217   CL 100 10/09/2019 1217   CO2 29 10/09/2019 1217   BUN 16 10/09/2019 1217   BUN 16 08/14/2019 0918   CREATININE 0.84 10/09/2019 1217   CREATININE 1.25 (H) 08/08/2016 0912      Component Value Date/Time   CALCIUM 9.9 10/09/2019 1217   ALKPHOS 75 10/09/2019 1217   AST 16 10/09/2019 1217   ALT 12 10/09/2019 1217   BILITOT 0.3 10/09/2019 1217       RADIOGRAPHIC STUDIES: DG Chest 2 View  Result Date: 10/15/2019 CLINICAL DATA:  Shortness of breath.  Non-small cell Kennedy cancer. EXAM: CHEST - 2 VIEW COMPARISON:  PET-CT 09/13/2019, chest radiograph 08/20/2019 and chest CT 08/16/2019. FINDINGS: New right IJ Port-A-Cath extends to the level of the mid SVC. Interval improved mediastinal and hilar adenopathy. The posterior left hilar and superior segment left lower lobe mass has decreased in size. No other definite residual visible pulmonary nodules. There is mild interstitial prominence and mild central airway thickening. No pleural effusion or pneumothorax. The bones appear unremarkable. IMPRESSION: 1. Mild interstitial prominence could reflect  bronchitis or treatment-related changes and contribute to the patient's symptoms. 2. Interval improvement in previously demonstrated left perihilar mass and adenopathy consistent with response to therapy. Electronically Signed   By: Richardean Sale M.D.   On: 10/15/2019 15:01    ASSESSMENT AND PLAN: This is a very pleasant 79 years old [name] with stage IV non-small cell Kennedy cancer, squamous cell carcinoma with negative PD-L1 expression. The patient is currently undergoing systemic chemotherapy with carboplatin and paclitaxel for 2 cycles in addition to immunotherapy with ipilimumab 1 mg/KG every 6 weeks and nivolumab 360 mg IV every 3 weeks status post 1 cycle. I recommended for the patient  to proceed with cycle #2 today as planned.  From now on he will be treated with only immunotherapy with ipilimumab 1 mg/KG every 6 weeks as well as nivolumab 360 mg IV every 3 weeks.  He will not have any chemotherapy component of this treatment at this point. For the chemotherapy-induced anemia, we will arrange for the patient to receive 1 unit of PRBCs transfusion later this week. For the odynophagia, he will continue on Carafate on as-needed basis. For the constipation he is currently on MiraLAX as well as milk of magnesia as needed. The patient will come back for follow-up visit in 3 weeks for evaluation before starting day #22 of cycle #2. He was advised to call immediately if he has any concerning symptoms in the interval. The patient voices understanding of current disease status and treatment options and is in agreement with the current care plan.  All questions were answered. The patient knows to call the clinic with any problems, questions or concerns. We can certainly see the patient much sooner if necessary.  Disclaimer: This note was dictated with voice recognition software. Similar sounding words can inadvertently be transcribed and may not be corrected upon review.

## 2019-10-16 NOTE — Progress Notes (Signed)
Ipilimumab checklist reviewed prior to administration. All answers negative.

## 2019-10-17 DIAGNOSIS — Z7984 Long term (current) use of oral hypoglycemic drugs: Secondary | ICD-10-CM | POA: Diagnosis not present

## 2019-10-17 DIAGNOSIS — Z803 Family history of malignant neoplasm of breast: Secondary | ICD-10-CM | POA: Diagnosis not present

## 2019-10-17 DIAGNOSIS — Z7722 Contact with and (suspected) exposure to environmental tobacco smoke (acute) (chronic): Secondary | ICD-10-CM | POA: Diagnosis not present

## 2019-10-17 DIAGNOSIS — I10 Essential (primary) hypertension: Secondary | ICD-10-CM | POA: Diagnosis not present

## 2019-10-17 DIAGNOSIS — Z7982 Long term (current) use of aspirin: Secondary | ICD-10-CM | POA: Diagnosis not present

## 2019-10-17 DIAGNOSIS — N529 Male erectile dysfunction, unspecified: Secondary | ICD-10-CM | POA: Diagnosis not present

## 2019-10-17 DIAGNOSIS — G4700 Insomnia, unspecified: Secondary | ICD-10-CM | POA: Diagnosis not present

## 2019-10-17 DIAGNOSIS — E1151 Type 2 diabetes mellitus with diabetic peripheral angiopathy without gangrene: Secondary | ICD-10-CM | POA: Diagnosis not present

## 2019-10-17 DIAGNOSIS — E785 Hyperlipidemia, unspecified: Secondary | ICD-10-CM | POA: Diagnosis not present

## 2019-10-17 DIAGNOSIS — R69 Illness, unspecified: Secondary | ICD-10-CM | POA: Diagnosis not present

## 2019-10-17 LAB — BPAM RBC
Blood Product Expiration Date: 202110012359
ISSUE DATE / TIME: 202109081215
Unit Type and Rh: 5100

## 2019-10-17 LAB — TYPE AND SCREEN
ABO/RH(D): O POS
Antibody Screen: NEGATIVE
Unit division: 0

## 2019-10-18 ENCOUNTER — Telehealth: Payer: Self-pay | Admitting: Emergency Medicine

## 2019-10-18 ENCOUNTER — Emergency Department (HOSPITAL_COMMUNITY): Payer: Medicare HMO

## 2019-10-18 ENCOUNTER — Other Ambulatory Visit: Payer: Self-pay

## 2019-10-18 ENCOUNTER — Emergency Department (HOSPITAL_COMMUNITY)
Admission: EM | Admit: 2019-10-18 | Discharge: 2019-10-18 | Disposition: A | Discharge status: Home / Self Care | Payer: Medicare HMO | Attending: Emergency Medicine | Admitting: Emergency Medicine

## 2019-10-18 ENCOUNTER — Encounter (HOSPITAL_COMMUNITY): Payer: Self-pay | Admitting: Emergency Medicine

## 2019-10-18 DIAGNOSIS — Z7982 Long term (current) use of aspirin: Secondary | ICD-10-CM | POA: Insufficient documentation

## 2019-10-18 DIAGNOSIS — Z87891 Personal history of nicotine dependence: Secondary | ICD-10-CM | POA: Diagnosis not present

## 2019-10-18 DIAGNOSIS — E1121 Type 2 diabetes mellitus with diabetic nephropathy: Secondary | ICD-10-CM | POA: Insufficient documentation

## 2019-10-18 DIAGNOSIS — Z79899 Other long term (current) drug therapy: Secondary | ICD-10-CM | POA: Diagnosis not present

## 2019-10-18 DIAGNOSIS — I10 Essential (primary) hypertension: Secondary | ICD-10-CM | POA: Diagnosis not present

## 2019-10-18 DIAGNOSIS — Z7984 Long term (current) use of oral hypoglycemic drugs: Secondary | ICD-10-CM | POA: Insufficient documentation

## 2019-10-18 DIAGNOSIS — J45909 Unspecified asthma, uncomplicated: Secondary | ICD-10-CM | POA: Diagnosis not present

## 2019-10-18 DIAGNOSIS — Z20822 Contact with and (suspected) exposure to covid-19: Secondary | ICD-10-CM | POA: Insufficient documentation

## 2019-10-18 DIAGNOSIS — R0602 Shortness of breath: Secondary | ICD-10-CM

## 2019-10-18 LAB — CBC WITH DIFFERENTIAL/PLATELET
Abs Immature Granulocytes: 0.03 10*3/uL (ref 0.00–0.07)
Basophils Absolute: 0 10*3/uL (ref 0.0–0.1)
Basophils Relative: 0 %
Eosinophils Absolute: 0.1 10*3/uL (ref 0.0–0.5)
Eosinophils Relative: 1 %
HCT: 31.5 % — ABNORMAL LOW (ref 39.0–52.0)
Hemoglobin: 10.1 g/dL — ABNORMAL LOW (ref 13.0–17.0)
Immature Granulocytes: 0 %
Lymphocytes Relative: 9 %
Lymphs Abs: 0.9 10*3/uL (ref 0.7–4.0)
MCH: 30.1 pg (ref 26.0–34.0)
MCHC: 32.1 g/dL (ref 30.0–36.0)
MCV: 93.8 fL (ref 80.0–100.0)
Monocytes Absolute: 0.4 10*3/uL (ref 0.1–1.0)
Monocytes Relative: 5 %
Neutro Abs: 8.1 10*3/uL — ABNORMAL HIGH (ref 1.7–7.7)
Neutrophils Relative %: 85 %
Platelets: 353 10*3/uL (ref 150–400)
RBC: 3.36 MIL/uL — ABNORMAL LOW (ref 4.22–5.81)
RDW: 16.8 % — ABNORMAL HIGH (ref 11.5–15.5)
WBC: 9.5 10*3/uL (ref 4.0–10.5)
nRBC: 0 % (ref 0.0–0.2)

## 2019-10-18 LAB — COMPREHENSIVE METABOLIC PANEL
ALT: 15 U/L (ref 0–44)
AST: 20 U/L (ref 15–41)
Albumin: 3.7 g/dL (ref 3.5–5.0)
Alkaline Phosphatase: 59 U/L (ref 38–126)
Anion gap: 9 (ref 5–15)
BUN: 14 mg/dL (ref 8–23)
CO2: 30 mmol/L (ref 22–32)
Calcium: 9.4 mg/dL (ref 8.9–10.3)
Chloride: 101 mmol/L (ref 98–111)
Creatinine, Ser: 0.76 mg/dL (ref 0.61–1.24)
GFR calc Af Amer: >60 mL/min (ref >60)
GFR calc non Af Amer: >60 mL/min (ref >60)
Glucose, Bld: 103 mg/dL — ABNORMAL HIGH (ref 70–99)
Potassium: 4.6 mmol/L (ref 3.5–5.1)
Sodium: 140 mmol/L (ref 135–145)
Total Bilirubin: 0.6 mg/dL (ref 0.3–1.2)
Total Protein: 7.8 g/dL (ref 6.5–8.1)

## 2019-10-18 LAB — URINALYSIS, ROUTINE W REFLEX MICROSCOPIC
Bilirubin Urine: NEGATIVE
Glucose, UA: NEGATIVE mg/dL
Hgb urine dipstick: NEGATIVE
Ketones, ur: NEGATIVE mg/dL
Leukocytes,Ua: NEGATIVE
Nitrite: NEGATIVE
Protein, ur: NEGATIVE mg/dL
Specific Gravity, Urine: 1.008 (ref 1.005–1.030)
pH: 8 (ref 5.0–8.0)

## 2019-10-18 LAB — SARS CORONAVIRUS 2 BY RT PCR (HOSPITAL ORDER, PERFORMED IN ~~LOC~~ HOSPITAL LAB): SARS Coronavirus 2: NEGATIVE

## 2019-10-18 LAB — TROPONIN I (HIGH SENSITIVITY)
Troponin I (High Sensitivity): 9 ng/L (ref <18)
Troponin I (High Sensitivity): 9 ng/L (ref <18)

## 2019-10-18 LAB — BRAIN NATRIURETIC PEPTIDE: B Natriuretic Peptide: 107.5 pg/mL — ABNORMAL HIGH (ref 0.0–100.0)

## 2019-10-18 MED ORDER — IOHEXOL 350 MG/ML SOLN
100.0000 mL | Freq: Once | INTRAVENOUS | Status: AC | PRN
  Administered 2019-10-18: 60 mL via INTRAVENOUS

## 2019-10-18 MED ORDER — SODIUM CHLORIDE 0.9 % IV BOLUS
500.0000 mL | Freq: Once | INTRAVENOUS | Status: AC
  Administered 2019-10-18: 500 mL via INTRAVENOUS

## 2019-10-18 NOTE — Telephone Encounter (Signed)
Spoke with patient's wife Shaun Kennedy regarding prior message. Patient has been SOB,Wheezing . Patient  Did use his inhaler today and was not helping. [name] to bring husband to the ED.[name]'s voice was understanding. Nothing else further needed.

## 2019-10-18 NOTE — ED Triage Notes (Signed)
Per pt, states SOB on and off for awhile-history of lung cancer-no pain

## 2019-10-18 NOTE — Telephone Encounter (Signed)
PLEASE CALL - [phone] - pt's cell phone - wife's is messed up and she can't answer it

## 2019-10-18 NOTE — ED Notes (Addendum)
Patient aware that we need a urine sample. Cannot urinate at this time. Urinal at bedside.

## 2019-10-18 NOTE — ED Notes (Signed)
Patient back from CT.

## 2019-10-18 NOTE — Telephone Encounter (Signed)
Left a voicemail for patient to call our office back regarding prior message.

## 2019-10-18 NOTE — Telephone Encounter (Signed)
ATC patient at [phone] [facility]

## 2019-10-18 NOTE — Discharge Instructions (Addendum)
You were seen in the ER today for shortness of breath.  Your work-up did not show any evidence of clots in your lungs, pneumonia,  heart attack, heart failure, or any other life-threatening causes of shortness of breath.  You were tested for Covid-19 today, you may find the results of this test within several hours on the MyChart app.  If you do not have MyChart, there are instructions on your discharge paperwork which can help you set this up.  Please make sure to quarantine until you receive your COVID-19 test results.  You will need to quarantine an additional 7 to 10 days if this is positive.  Please make sure to keep your appointment with your pulmonology doctor.  Return to the ER if you have any new or worsening symptoms.

## 2019-10-18 NOTE — ED Provider Notes (Signed)
[facility] DEPT Provider Note   CSN: [id number] Arrival date & time: 10/18/19  1121     History Chief Complaint  Patient presents with  . Shortness of Breath    Shaun Kennedy is a 79 y.o. male.  HPI 79 year old male w/ a history of colon cancer, DM type II, GERD, hypertension, hyperlipidemia, antineoplastic induced anemia presents to the ER with complaints of worsening shortness of breath of the last several days.  Patient states that he has had a history of shortness of breath with exertion, but has not had an episode in quite some time.  He does not know why he gets episodes like this.  He states that he has recently finished chemotherapy for lung cancer.  He denies any hemoptysis, cough.  Patient has a stridor when speaking at times, states that this is at baseline and unchanged.  He denies any swelling to lower extremities, no chest pain.  He is fully vaccinated for Covid.  He follows with Dr. Lamonte Sakai with pulmonology.  Denies any rectal bleeding, no dark or tarry stools.  Denies any abdominal pain, nausea or vomiting.  No fevers or chills.  No lower extremity swelling.  Denies any urinary symptoms.  He is not on any anticoagulation and has no history of DVT or PE.    Past Medical History:  Diagnosis Date  . Allergic rhinitis   . Asthma   . Carotid stenosis   . Colon cancer ([place]) 2003  . Diabetes mellitus ([place])   . Diverticulosis   . Dyslipidemia   . ED (erectile dysfunction)   . GERD (gastroesophageal reflux disease)   . H/O degenerative disc disease   . Hemorrhoids   . HTN (hypertension)   . Hyperlipidemia   . LVH (left ventricular hypertrophy)    on EKG  . Mass of lower lobe of left lung   . Mediastinal adenopathy   . Smoker    former  . Wears dentures   . Wears glasses     Patient Active Problem List   Diagnosis Date Noted  . Antineoplastic chemotherapy induced anemia 10/16/2019  . Shortness of breath 10/15/2019  . Odynophagia  09/25/2019  . Port-A-Cath in place 09/17/2019  . Decreased appetite 09/03/2019  . Malignant neoplasm of lower lobe of left lung ([place]) 08/28/2019  . Encounter for antineoplastic chemotherapy 08/24/2019  . Encounter for antineoplastic immunotherapy 08/24/2019  . Goals of care, counseling/discussion 08/24/2019  . Mediastinal adenopathy 08/20/2019  . Atherosclerosis of aorta ([place]) 04/09/2019  . Carpal tunnel syndrome, left upper limb 02/19/2019  . History of carpal tunnel surgery of right wrist 12/21/2018  . OAB (overactive bladder) 10/11/2017  . Diabetic nephropathy associated with type 2 diabetes mellitus ([place]) 10/11/2017  . Onychomycosis of multiple toenails with type 2 diabetes mellitus ([place]) 07/22/2015  . Former smoker, stopped smoking in distant past 07/22/2015  . Diabetes mellitus ([place]) 06/13/2011  . ED (erectile dysfunction) 06/13/2011  . History of prostate cancer 06/13/2011  . Hypertension associated with diabetes ([place]) 06/13/2011  . Hyperlipidemia associated with type 2 diabetes mellitus ([place]) 06/13/2011  . Allergic rhinitis, mild 06/13/2011  . History of asthma 06/13/2011  . Carotid stenosis 06/13/2011    Past Surgical History:  Procedure Laterality Date  . BRONCHIAL BIOPSY  08/20/2019   Procedure: BRONCHIAL BIOPSIES;  Surgeon: Collene Gobble, MD;  Location: [facility] ENDOSCOPY;  Service: Pulmonary;;  . BRONCHIAL BRUSHINGS  08/20/2019   Procedure: BRONCHIAL BRUSHINGS;  Surgeon: Collene Gobble, MD;  Location: [facility] ENDOSCOPY;  Service: Pulmonary;;  . BRONCHIAL NEEDLE ASPIRATION BIOPSY  08/20/2019   Procedure: BRONCHIAL NEEDLE ASPIRATION BIOPSIES;  Surgeon: Collene Gobble, MD;  Location: [facility] ENDOSCOPY;  Service: Pulmonary;;  . CARPAL TUNNEL RELEASE Right 01/09/2019   Procedure: CARPAL TUNNEL RELEASE;  Surgeon: Leandrew Koyanagi, MD;  Location: [facility];  Service: Orthopedics;  Laterality: Right;  . CATARACT EXTRACTION Right 2018  . COLONOSCOPY  2007   Dr. Benson Norway  . I & D  EXTREMITY Right 04/02/2016   Procedure: IRRIGATION AND DEBRIDEMENT GREAT TOE;  Surgeon: Leandrew Koyanagi, MD;  Location: [place];  Service: Orthopedics;  Laterality: Right;  . IR IMAGING GUIDED PORT INSERTION  08/30/2019  . MULTIPLE TOOTH EXTRACTIONS    . RADIOACTIVE SEED IMPLANT  2003  . ULNAR TUNNEL RELEASE Right 01/09/2019   Procedure: RIGHT CUBITAL TUNNEL RELEASE AND CARPAL TUNNEL RELEASE;  Surgeon: Leandrew Koyanagi, MD;  Location: [place];  Service: Orthopedics;  Laterality: Right;  . UPPER GASTROINTESTINAL ENDOSCOPY    . VIDEO BRONCHOSCOPY WITH ENDOBRONCHIAL ULTRASOUND N/A 08/20/2019   Procedure: VIDEO BRONCHOSCOPY WITH ENDOBRONCHIAL ULTRASOUND;  Surgeon: Collene Gobble, MD;  Location: [facility] ENDOSCOPY;  Service: Pulmonary;  Laterality: N/A;       Family History  Problem Relation Age of Onset  . Heart failure Mother   . Diabetes Mother   . Stroke Father   . Diabetes Father   . Diabetes Sister   . Diabetes Sister   . Lung cancer Sister 24  . Colon cancer Neg Hx   . Esophageal cancer Neg Hx   . Rectal cancer Neg Hx   . Colon polyps Neg Hx   . Stomach cancer Neg Hx     Social History   Tobacco Use  . Smoking status: Former Smoker    Packs/day: 1.00    Years: 32.00    Pack years: 32.00    Start date: 02/08/1955    Quit date: 10/09/1987    Years since quitting: 32.0  . Smokeless tobacco: Never Used  Vaping Use  . Vaping Use: Never used  Substance Use Topics  . Alcohol use: No  . Drug use: No    Home Medications Prior to Admission medications   Medication Sig Start Date End Date Taking? Authorizing Provider  amLODipine (NORVASC) 5 MG tablet TAKE 1 TABLET BY MOUTH EVERY DAY 10/02/19   [name], MD  aspirin EC 81 MG tablet Take 81 mg by mouth daily after breakfast.     [provider]  Blood Glucose Monitoring Suppl (ONETOUCH VERIO) w/Device KIT USE TO CHECK SUGAR DAILY 02/21/17   [name], MD  Blood Glucose Monitoring Suppl [facility] VERIO)  w/Device KIT USE TO CHECK SUGAR DAILY 03/27/18   [name], MD  cholecalciferol (VITAMIN D) 1000 units tablet Take 1,000 Units by mouth daily after breakfast.     [provider]  docusate sodium (COLACE) 100 MG capsule Take 1 capsule (100 mg total) by mouth 2 (two) times daily as needed for mild constipation. 09/09/19   Petrucelli, Samantha R, PA-C  doxycycline (VIBRA-TABS) 100 MG tablet Take 1 tablet (100 mg total) by mouth 2 (two) times daily. 10/15/19   Lauraine Rinne, NP  ferrous sulfate 325 (65 FE) MG EC tablet Take 1 tablet (325 mg total) by mouth daily with breakfast. 09/17/19   Tanner, Lyndon Code., PA-C  guaiFENesin-codeine (ROBITUSSIN AC) 100-10 MG/5ML syrup Take 5 mLs by mouth 3 (three)  times daily as needed for cough. 09/17/19   Tanner, Lyndon Code., PA-C  Lancets (ONETOUCH DELICA PLUS [id number]) MISC PATIENT IS TO TEST TWO TIMES A DAY DX: E11.9 (ONETOUCH VERIO FLEX) 04/23/19   [name], MD  lidocaine-prilocaine (EMLA) cream Apply to the Port-A-Cath site 30-60-minute before chemotherapy. 08/24/19   Curt Bears, MD  lisinopril-hydrochlorothiazide (ZESTORETIC) 20-12.5 MG tablet Take 1 tablet by mouth daily. Patient taking differently: Take 1 tablet by mouth daily after breakfast.  10/17/18   [name], MD  LORazepam (ATIVAN) 0.5 MG tablet 1 tablet 30 minutes before the MRI.  Repeat once if needed. 08/28/19   Curt Bears, MD  metFORMIN (GLUCOPHAGE-XR) 500 MG 24 hr tablet TAKE 1 TABLET BY MOUTH EVERY DAY Patient taking differently: Take 500 mg by mouth daily after breakfast.  07/05/19   [name], MD  mirabegron ER (MYRBETRIQ) 50 MG TB24 tablet Take 1 tablet (50 mg total) by mouth daily. 09/23/19   [name], MD  mirtazapine (REMERON) 15 MG tablet TAKE 1 TABLET BY MOUTH EVERYDAY AT BEDTIME 09/27/19   Heilingoetter, Cassandra L, PA-C  Multiple Vitamin (MULTIVITAMIN WITH MINERALS) TABS tablet Take 1 tablet by mouth daily after breakfast.     [provider]    [facility] VERIO test strip USE AS INSTRUCTED TO CHECK TWICE DAILY 10/07/19   [name], MD  predniSONE (DELTASONE) 10 MG tablet 4 tabs for 2 days, then 3 tabs for 2 days, 2 tabs for 2 days, then 1 tab for 2 days, then stop 10/15/19   Lauraine Rinne, NP  prochlorperazine (COMPAZINE) 10 MG tablet Take 1 tablet (10 mg total) by mouth every 6 (six) hours as needed for nausea or vomiting. 08/23/19   Curt Bears, MD  simvastatin (ZOCOR) 40 MG tablet Take 1 tablet (40 mg total) by mouth daily. Patient taking differently: Take 40 mg by mouth every evening.  10/17/18   [name], MD  sucralfate (CARAFATE) 1 GM/10ML suspension Take 10 mLs (1 g total) by mouth 4 (four) times daily -  with meals and at bedtime. 09/25/19   Curt Bears, MD  Tiotropium Bromide-Olodaterol (STIOLTO RESPIMAT) 2.5-2.5 MCG/ACT AERS Inhale 2 puffs into the lungs daily. 10/15/19   Lauraine Rinne, NP    Allergies    Patient has no known allergies.  Review of Systems   Review of Systems  Constitutional: Negative for chills and fever.  HENT: Negative for ear pain and sore throat.   Eyes: Negative for pain and visual disturbance.  Respiratory: Positive for shortness of breath. Negative for cough.   Cardiovascular: Negative for chest pain and palpitations.  Gastrointestinal: Negative for abdominal pain and vomiting.  Genitourinary: Negative for dysuria and hematuria.  Musculoskeletal: Negative for arthralgias and back pain.  Skin: Negative for color change and rash.  Neurological: Negative for seizures and syncope.  All other systems reviewed and are negative.   Physical Exam Updated Vital Signs BP (!) 107/59   Pulse (!) 101   Temp 98.9 F (37.2 C) (Oral)   Resp 19   SpO2 99%   Physical Exam Vitals reviewed.  Constitutional:      General: He is not in acute distress.    Appearance: Normal appearance. He is well-developed. He is not ill-appearing, toxic-appearing or diaphoretic.  HENT:     Head:  Normocephalic and atraumatic.     Mouth/Throat:     Comments: Oropharynx non erythematous without exudates, uvula midline, no unilateral tonsillar swelling, tongue  normal size and midline, no sublingual/submandibular swellimg, tolerating secretions well    Eyes:     General:        Right eye: No discharge.        Left eye: No discharge.     Extraocular Movements: Extraocular movements intact.     Conjunctiva/sclera: Conjunctivae normal.  Cardiovascular:     Rate and Rhythm: Normal rate and regular rhythm.  Pulmonary:     Effort: No tachypnea or accessory muscle usage.     Breath sounds: Stridor present. No decreased breath sounds, wheezing or rales.     Comments: Occasional stridor at baseline per patient, lung sounds clear, no wheezes or rhonchi, chest rise equal and unlabored, speaking full sentences Chest:     Chest wall: No tenderness.  Abdominal:     Palpations: Abdomen is soft.  Musculoskeletal:        General: No swelling. Normal range of motion.     Cervical back: Normal range of motion.     Right lower leg: No tenderness. No edema.     Left lower leg: No tenderness. No edema.  Skin:    General: Skin is warm and dry.     Capillary Refill: Capillary refill takes less than 2 seconds.     Comments: Port-A-Cath without evidence of erythema, drainage, swelling, tenderness.  No evidence of infection  Neurological:     General: No focal deficit present.     Mental Status: He is alert and oriented to person, place, and time.  Psychiatric:        Mood and Affect: Mood normal.        Behavior: Behavior normal.     ED Results / Procedures / Treatments   Labs (all labs ordered are listed, but only abnormal results are displayed) Labs Reviewed  CBC WITH DIFFERENTIAL/PLATELET - Abnormal; Notable for the following components:      Result Value   RBC 3.36 (*)    Hemoglobin 10.1 (*)    HCT 31.5 (*)    RDW 16.8 (*)    Neutro Abs 8.1 (*)    All other components within normal  limits  COMPREHENSIVE METABOLIC PANEL - Abnormal; Notable for the following components:   Glucose, Bld 103 (*)    All other components within normal limits  BRAIN NATRIURETIC PEPTIDE - Abnormal; Notable for the following components:   B Natriuretic Peptide 107.5 (*)    All other components within normal limits  SARS CORONAVIRUS 2 BY RT PCR (HOSPITAL ORDER, [place] [facility])  URINALYSIS, ROUTINE W REFLEX MICROSCOPIC  TROPONIN I (HIGH SENSITIVITY)  TROPONIN I (HIGH SENSITIVITY)    EKG EKG Interpretation  Date/Time:  Friday October 18 2019 11:32:22 EDT Ventricular Rate:  91 PR Interval:    QRS Duration: 90 QT Interval:  357 QTC Calculation: 440 R Axis:   63 Text Interpretation: Sinus rhythm Prolonged PR interval Probable anteroseptal infarct, old Nonspecific T abnormalities, lateral leads Confirmed by Virgel Manifold [phone]) on 10/18/2019 1:40:45 PM   Radiology DG Chest 2 View  Result Date: 10/18/2019 CLINICAL DATA:  Shortness of breath EXAM: CHEST - 2 VIEW COMPARISON:  October 15, 2019 chest radiograph; PET-CT September 13, 2019 FINDINGS: Port-A-Cath tip is in the superior vena cava. No pneumothorax. There is ill-defined opacity in the posterior segment of the left upper lobe consistent with a focus of pneumonia. The previously noted lesion in the left lower lobe seen on PET study is not appreciable on current radiographic examination.  Pulmonary nodular opacity seen on recent PET study not convincingly seen on current chest radiographic examination. Heart size and pulmonary vascularity are normal. Question a degree of left hilar adenopathy, better appreciated on prior CT. No bone lesions. IMPRESSION: Apparent infiltrate in posterior segment left upper lobe. Previous pulmonary nodular lesions and left lower lobe mass not appreciable by radiography currently. Question a degree of left hilar adenopathy. Heart size normal. Port-A-Cath tip in superior vena cava. Electronically  Signed   By: Lowella Grip III M.D.   On: 10/18/2019 12:24   CT Angio Chest PE W and/or Wo Contrast  Result Date: 10/18/2019 CLINICAL DATA:  PE suspected, high prob Shortness of breath. History of lung cancer diagnosed 2 months ago. Chemotherapy and radiation. EXAM: CT ANGIOGRAPHY CHEST WITH CONTRAST TECHNIQUE: Multidetector CT imaging of the chest was performed using the standard protocol during bolus administration of intravenous contrast. Multiplanar CT image reconstructions and MIPs were obtained to evaluate the vascular anatomy. CONTRAST:  78m OMNIPAQUE IOHEXOL 350 MG/ML SOLN COMPARISON:  Radiograph earlier today. PET CT 09/13/2019. contrast-enhanced chest CT 08/16/2019 FINDINGS: Cardiovascular: There are no filling defects within the pulmonary arteries to suggest pulmonary embolus. Some of the left lower lobe pulmonary arteries are attenuated by left infrahilar mass but no evidence of filling defect or invasion. Right internal jugular chest port tip in the SVC. Aortic atherosclerosis without dissection or acute aortic findings. Heart is normal in size. There are coronary artery calcifications. No pericardial effusion. Mediastinum/Nodes: Improved mediastinal adenopathy from prior exam. Residual necrotic prevascular node measures 7 mm short axis, previously 12 mm. The previous right paratracheal adenopathy is not well assessed on the current exam due to adjacent streak artifact, however lower paratracheal node measures approximately 13 mm, series 6, image 66, previously 20 mm on my retrospective measurement. There is an enlarged subcarinal node spanning 19 mm that is unchanged, this was not hypermetabolic on PET. No new or progressive adenopathy. Suggestion of diffuse esophageal wall thickening. Lungs/Pleura: Left lower lobe infrahilar nodule has diminished in size currently measuring 4.4 x 2.8 cm with interval central necrosis. Surrounding irregular soft tissue density with spiculated margins extend to  the fissure. Similar small left pleural effusion and prior exam. The majority of the small multifocal pulmonary nodules in both lungs have diminished in size or resolved in the interim. There are small residual pulmonary nodules, largest in the right middle lobe measuring 5 mm, series 8, image 94, and left lung apex measuring 7 mm, series 8, image 37. However, there is definite progression of a pleural based nodule in the left lower lobe that measures 14 x 11 mm, series 8 image 100, that previously measured 8 mm. There are hypoventilatory changes in the dependent lungs. Mild emphysema. No findings of pulmonary edema. Trachea and central bronchi are patent. No right pleural effusion. Upper Abdomen: No acute upper abdominal findings. There is a large volume of stool in the included colon. Musculoskeletal: No focal bone lesion or acute osseous abnormalities. Review of the MIP images confirms the above findings. IMPRESSION: 1. No pulmonary embolus. 2. Recent diagnosis of left lung cancer with positive response to treatment with improved mediastinal adenopathy, improved multifocal pulmonary nodules, and decreased size of the primary left lower lobe necrotic mass from prior PET. However, there is increased size of a single left pleural based lower lobe pulmonary nodule from prior exam. 3. Similar small left pleural effusion. 4. Suggestion of diffuse esophageal wall thickening, can be seen with esophagitis, possibly radiation induced. Aortic Atherosclerosis (  ICD10-I70.0) and Emphysema (ICD10-J43.9). Electronically Signed   By: Keith Rake M.D.   On: 10/18/2019 15:31    Procedures Procedures (including critical care time)  Medications Ordered in ED Medications  sodium chloride 0.9 % bolus 500 mL (0 mLs Intravenous Stopped 10/18/19 1410)  iohexol (OMNIPAQUE) 350 MG/ML injection 100 mL (60 mLs Intravenous Contrast Given 10/18/19 1453)    ED Course  I have reviewed the triage vital signs and the nursing  notes.  Pertinent labs & imaging results that were available during my care of the patient were reviewed by me and considered in my medical decision making (see chart for details).    MDM Rules/Calculators/A&P                         79 year old male progressive shortness of breath over the last 2 days On presentation, he is alert, oriented, nontoxic-appearing, in no acute distress, speaking in full sentences without increased work of breathing.  He does have a stridor which will occasionally present, however patient states that this is not changed and is at baseline.  Physical exam clear lung sounds, no evidence of epiglottitis, peritonsillar abscess, Ludwig's angina.  Abdomen is soft and nontender.  He is not pale appearing.  No evidence of fluid overload on lung exam or lower extremities.  CBC without leukocytosis, hemoglobin of 10.1, patient does have a history of anemia but his hemoglobin appears to be improved from values 2 days ago.  CMP without any electrolyte abnormalities, normal renal function.  Normal LFTs.  UA without evidence of UTI.  Delta troponin negative.  Chest x-ray with an apparent infiltrate in posterior segment of the left upper lobe.  Given the patient's history of cancer and this infiltrate on chest xray, CT PE study was ordered.  This did not show any evidence of pulmonary embolus.  It did comment on the improvement of the mediastinal adenopathy and the multifocal pulmonary nodules.  There is an increased size of the single left pleural space lower lobe pulmonary nodule.  He also showed diffuse esophageal wall thickening likely due to radiation esophagitis. No evidence of pneumonia.  Given that the patient is afebrile, with no cough or other infectious symptoms, along with no, tenderness on the PE study, will hold treatment on the questionable infiltrate seen on chest x-ray.  Patient was swabbed for Covid, BNP marginally elevated at 107.5. There was no evidence of fluid overload on  exam.  I discussed these findings with the patient who is overall reassured by the findings.  He states he has a follow-up appointment with his pulmonologist on 9/14.  Patient has good follow-up pending, and strict return precautions were given.  Remained hemodynamically stable throughout the ED course.  He voiced understanding and is agreeable.  At this stage in the ED course, the patient has been medically screened and is stable for discharge  Discussed the case with Dr. Tomi Bamberger who is agreeable to the above plan and disposition.  Final Clinical Impression(s) / ED Diagnoses Final diagnoses:  SOB (shortness of breath)    Rx / DC Orders ED Discharge Orders    None       Lyndel Safe 10/18/19 1638    Dorie Rank, MD 10/19/19 [phone]

## 2019-10-18 NOTE — ED Notes (Signed)
Patient transported to CT 

## 2019-10-20 ENCOUNTER — Telehealth: Payer: Self-pay | Admitting: Pulmonary Disease

## 2019-10-20 ENCOUNTER — Other Ambulatory Visit: Payer: Self-pay | Admitting: Family Medicine

## 2019-10-20 ENCOUNTER — Other Ambulatory Visit: Payer: Self-pay | Admitting: Pulmonary Disease

## 2019-10-20 ENCOUNTER — Other Ambulatory Visit: Payer: Self-pay | Admitting: Medical

## 2019-10-20 DIAGNOSIS — J209 Acute bronchitis, unspecified: Secondary | ICD-10-CM

## 2019-10-20 DIAGNOSIS — R918 Other nonspecific abnormal finding of lung field: Secondary | ICD-10-CM

## 2019-10-20 DIAGNOSIS — R059 Cough, unspecified: Secondary | ICD-10-CM

## 2019-10-20 DIAGNOSIS — R0602 Shortness of breath: Secondary | ICD-10-CM

## 2019-10-20 DIAGNOSIS — J440 Chronic obstructive pulmonary disease with acute lower respiratory infection: Secondary | ICD-10-CM

## 2019-10-20 NOTE — Telephone Encounter (Signed)
10/20/19   Pt was in the ED. Needs closer follow up with our our office.  A request was made for refill of prednisone taper.  We need additional information regarding why this request was made.  Patient was seen by the emergency room providers on 10/18/2019 with no new recommendations at that time.  Please make an appointment for the patient to have follow-up with Dr. Lamonte Sakai at next available ideally within the next 2 to 4 weeks.  Wyn Quaker FNP

## 2019-10-20 NOTE — Telephone Encounter (Signed)
Pt is requesting refill on prednisone 10 mg  next ov 11/21/19

## 2019-10-21 ENCOUNTER — Telehealth: Payer: Self-pay | Admitting: Radiation Oncology

## 2019-10-21 MED ORDER — PREDNISONE 10 MG PO TABS: ORAL_TABLET | ORAL | 0 refills | Status: DC

## 2019-10-21 NOTE — Telephone Encounter (Signed)
  Radiation Oncology         [phone] ________________________________  Name: ROOK MAUE MRN: [id number]  Date of Service: 10/21/2019  DOB: 07-02-1940  Post Treatment Telephone Note  Diagnosis:   Stage IV NSCLC, squamous cell carcinoma of the LLL.  Interval Since Last Radiation: 5 weeks   09/02/19 - 09/20/19: The left lung was treated to a dose of 37.5 Gy in 15 fractions using a 4-field 3D conformal technique.  2001 Radioactive Seed Implant: The patient underwent radioactive seed implant to the prostate.  Narrative:  The patient was contacted today for routine follow-up. During treatment he did very well with radiotherapy and did not have significant desquamation. He reports he is doing okay. He had some increasing shortness of breath and productive cough last week and was seen by Wyn Quaker, NP in pulmonary and given a prednisone taper after CXR showed inflammatory changes in the left lung. An ED visit over the weekend also occurred and CTPA was negative for embolism but did show improved diseasei n the left lung, but some increase in a pleural based lesion, and esophageal thickening felt to be consistent with radiation esophagitis.  Impression/Plan: 1. Stage IV NSCLC, squamous cell carcinoma of the LLL. The patient has been doing well since completion of radiotherapy. We discussed that we would be happy to continue to follow him as needed, but he will also continue to follow up with Dr. Julien Nordmann in medical oncology.  2. Remote history of prostate cancer. The patient continues to follow up with urology for PSA levels and has remained NED. 3. Probable pneumonitis. The patient will continue his prednisone and I encouraged him to complete this course and to follow up with [place] or pulmonary if his symptoms progressed or worsened.    Carola Rhine, PAC

## 2019-10-21 NOTE — Telephone Encounter (Signed)
10/21/2019  Received notification from radiation oncology PA for the patient was probably having pneumonitis.  Will send in prednisone taper today.  Wyn Quaker, FNP

## 2019-10-21 NOTE — Telephone Encounter (Signed)
Yea sent message that he needs to be seen in clinic sooner. Will send in longer pred taper for the suspected pneumonitis.   Wyn Quaker FNP

## 2019-10-21 NOTE — Telephone Encounter (Signed)
Called and spoke with pt letting him know the info stated by Aaron Edelman and that he sent pred Rx to pharmacy for him. Pt verbalized understanding. Pt has f/u scheduled with RB 10/14 having PFT prior. Stated to pt to make sure he kept those appts and to call us if he needed anything prior. Nothing further needed.

## 2019-10-21 NOTE — Telephone Encounter (Signed)
For approval or refusal.  Thanks! Threasa Beards

## 2019-10-23 ENCOUNTER — Ambulatory Visit: Payer: Medicare HMO | Admitting: Family Medicine

## 2019-10-25 ENCOUNTER — Ambulatory Visit: Payer: Medicare HMO | Admitting: Family Medicine

## 2019-10-26 ENCOUNTER — Other Ambulatory Visit: Payer: Self-pay | Admitting: Pulmonary Disease

## 2019-10-26 ENCOUNTER — Other Ambulatory Visit: Payer: Self-pay | Admitting: Family Medicine

## 2019-10-26 DIAGNOSIS — J440 Chronic obstructive pulmonary disease with acute lower respiratory infection: Secondary | ICD-10-CM

## 2019-10-26 DIAGNOSIS — R0602 Shortness of breath: Secondary | ICD-10-CM

## 2019-10-26 DIAGNOSIS — J209 Acute bronchitis, unspecified: Secondary | ICD-10-CM

## 2019-10-28 ENCOUNTER — Other Ambulatory Visit: Payer: Self-pay

## 2019-10-28 ENCOUNTER — Encounter: Payer: Self-pay | Admitting: Family Medicine

## 2019-10-28 ENCOUNTER — Ambulatory Visit (INDEPENDENT_AMBULATORY_CARE_PROVIDER_SITE_OTHER): Payer: Medicare HMO | Admitting: Family Medicine

## 2019-10-28 VITALS — BP 122/70 | HR 103 | Temp 97.1°F | Ht 67.0 in | Wt 152.4 lb

## 2019-10-28 DIAGNOSIS — E119 Type 2 diabetes mellitus without complications: Secondary | ICD-10-CM

## 2019-10-28 DIAGNOSIS — R630 Anorexia: Secondary | ICD-10-CM | POA: Diagnosis not present

## 2019-10-28 DIAGNOSIS — N3281 Overactive bladder: Secondary | ICD-10-CM | POA: Diagnosis not present

## 2019-10-28 DIAGNOSIS — Z87891 Personal history of nicotine dependence: Secondary | ICD-10-CM

## 2019-10-28 DIAGNOSIS — I152 Hypertension secondary to endocrine disorders: Secondary | ICD-10-CM

## 2019-10-28 DIAGNOSIS — I10 Essential (primary) hypertension: Secondary | ICD-10-CM

## 2019-10-28 DIAGNOSIS — C3432 Malignant neoplasm of lower lobe, left bronchus or lung: Secondary | ICD-10-CM | POA: Diagnosis not present

## 2019-10-28 DIAGNOSIS — E1121 Type 2 diabetes mellitus with diabetic nephropathy: Secondary | ICD-10-CM | POA: Diagnosis not present

## 2019-10-28 DIAGNOSIS — E1159 Type 2 diabetes mellitus with other circulatory complications: Secondary | ICD-10-CM

## 2019-10-28 DIAGNOSIS — E1169 Type 2 diabetes mellitus with other specified complication: Secondary | ICD-10-CM

## 2019-10-28 DIAGNOSIS — I700 Atherosclerosis of aorta: Secondary | ICD-10-CM

## 2019-10-28 DIAGNOSIS — Z0000 Encounter for general adult medical examination without abnormal findings: Secondary | ICD-10-CM

## 2019-10-28 DIAGNOSIS — Z89411 Acquired absence of right great toe: Secondary | ICD-10-CM

## 2019-10-28 DIAGNOSIS — Z23 Encounter for immunization: Secondary | ICD-10-CM | POA: Diagnosis not present

## 2019-10-28 DIAGNOSIS — J309 Allergic rhinitis, unspecified: Secondary | ICD-10-CM | POA: Diagnosis not present

## 2019-10-28 DIAGNOSIS — E785 Hyperlipidemia, unspecified: Secondary | ICD-10-CM

## 2019-10-28 DIAGNOSIS — Z8546 Personal history of malignant neoplasm of prostate: Secondary | ICD-10-CM

## 2019-10-28 DIAGNOSIS — G5602 Carpal tunnel syndrome, left upper limb: Secondary | ICD-10-CM

## 2019-10-28 DIAGNOSIS — Z8709 Personal history of other diseases of the respiratory system: Secondary | ICD-10-CM | POA: Diagnosis not present

## 2019-10-28 LAB — POCT GLYCOSYLATED HEMOGLOBIN (HGB A1C): Hemoglobin A1C: 6 % — AB (ref 4.0–5.6)

## 2019-10-28 NOTE — Patient Instructions (Signed)
  Shaun Kennedy , Thank you for taking time to come for your Medicare Wellness Visit. I appreciate your ongoing commitment to your health goals. Please review the following plan we discussed and let me know if I can assist you in the future.   These are the goals we discussed: Goals   None     This is a list of the screening recommended for you and due dates:  Health Maintenance  Topic Date Due  . Tetanus Vaccine  12/05/2017  . Complete foot exam   10/12/2018  . Eye exam for diabetics  01/24/2020  . Hemoglobin A1C  04/26/2020  . Flu Shot  Completed  . COVID-19 Vaccine  Completed  . Pneumonia vaccines  Completed

## 2019-10-28 NOTE — Telephone Encounter (Signed)
[facility] is requesting go fill pt dronabinol. Please advise [facility]

## 2019-10-28 NOTE — Progress Notes (Signed)
Shaun Kennedy is a 79 y.o. male who presents for annual wellness visit,CPE and follow-up on chronic medical conditions.  He has recently been diagnosed with lung cancer and is now involving chemotherapy for that.  This has caused some difficulty with his eating and he is using Marinol.  Apparently is getting some relief of his symptoms.  He is a former smoker he also has an underlying history of diabetes and is taking Metformin 500 mg daily and having no difficulty with that.  He has been having to drink a lot of fluids causing OAB trouble but is not taking Myrbetriq.  He does complain of bilateral wrist discomfort but is not interested in pursuing it any further.  Does have asthma but has not been using his medication with any regularity.  Also has a history of partial right great toe amputation.  He also has a history of prostate cancer dating back to 2003 but no difficulty since then.  His allergies seem to be under good control.   Immunizations and Health Maintenance Immunization History  Administered Date(s) Administered  . Fluad Quad(high Dose 65+) 10/17/2018  . Influenza, High Dose Seasonal PF 12/02/2015, 12/12/2016, 11/02/2017  . PFIZER SARS-COV-2 Vaccination 04/05/2019, 05/01/2019  . Pneumococcal Conjugate-13 06/25/2014  . Pneumococcal Polysaccharide-23 06/03/2010  . Tdap 12/06/2007  . Zoster 02/08/2008  . Zoster Recombinat (Shingrix) 02/25/2017, 04/26/2017   Health Maintenance Due  Topic Date Due  . TETANUS/TDAP  12/05/2017  . FOOT EXAM  10/12/2018  . INFLUENZA VACCINE  09/08/2019    Last colonoscopy: 08/05/19 Last PSA: 08/14/19 Dentist: not often  Ophtho: yearly Exercise: walking 15 min for six days  Other doctors caring for patient include: Mack Np pulmon. , Dr. Julien Nordmann oncology, Dr. Hilarie Fredrickson GI  Advanced Directives: Does Patient Have a Medical Advance Directive?: Yes Type of Advance Directive: Living will, Healthcare Power of [place] in  Chart?: No - copy requested Would patient like information on creating a medical advance directive?: No - Patient declined  Depression screen:  See questionnaire below.     Depression screen [facility] 2/9 10/28/2019 08/14/2019 10/17/2018 10/11/2017 08/08/2016  Decreased Interest 0 0 0 0 0  Down, Depressed, Hopeless 0 0 0 0 0  PHQ - 2 Score 0 0 0 0 0  Some recent data might be hidden    Fall Screen: See Questionaire below.   Fall Risk  10/28/2019 08/14/2019 10/17/2018 10/11/2017 08/08/2016  Falls in the past year? 0 0 0 No No    ADL screen:  See questionnaire below.  Functional Status Survey: Is the patient deaf or have difficulty hearing?: No Does the patient have difficulty seeing, even when wearing glasses/contacts?: No Does the patient have difficulty concentrating, remembering, or making decisions?: Yes Does the patient have difficulty walking or climbing stairs?: No Does the patient have difficulty dressing or bathing?: No Does the patient have difficulty doing errands alone such as visiting a doctor's office or shopping?: No   Review of Systems  Allergy: -sneezing, -itching, -congestion Dermatology: denies changing moles, rash, lumps ENT: -runny nose, -ear pain, -sore throat,  Cardiology:  -chest pain, -palpitations, -orthopnea, Respiratory: -cough, -shortness of breath, -dyspnea on exertion, -wheezing,  Gastroenterology: -abdominal pain, -nausea, -vomiting, -diarrhea, -constipation, -dysphagia Hematology: -bleeding or bruising problems Musculoskeletal: -arthralgias, -myalgias, -joint swelling, -back pain, - Ophthalmology: -vision changes,  Urology: -dysuria, -difficulty urinating,  -urinary frequency, -urgency, incontinence Neurology: -, -numbness, , -memory loss, -falls, -dizziness    PHYSICAL EXAM:  General  Appearance: Alert, cooperative, no distress, appears stated age Head: Normocephalic, without obvious abnormality, atraumatic Eyes: PERRL, conjunctiva/corneas clear, EOM's  intact, Ears: Normal TM's and external ear canals Nose: Nares normal, mucosa normal, no drainage or sinus   tenderness Throat: Lips, mucosa, and tongue normal; teeth and gums normal Neck: Supple, no lymphadenopathy, thyroid:no enlargement/tenderness/nodules; no carotid bruit or JVD Lungs: Clear to auscultation bilaterally without wheezes, rales or ronchi; respirations unlabored Heart: Regular rate and rhythm, S1 and S2 normal, no murmur, rub or gallop Extremities: No clubbing, cyanosis or edema.  Partial amputation of the right great toe is noted. Pulses: 2+ and symmetric all extremities Skin: Skin color, texture, turgor normal, no rashes or lesions Neurologic: CNII-XII intact, normal strength, sensation and gait; reflexes 2+ and symmetric throughout   Psych: Normal mood, affect, hygiene and grooming  ASSESSMENT/PLAN: Routine general medical examination at a health care facility  Immunization, viral disease - Plan: Pfizer SARS-COV-2 Vaccine  Need for immunization against influenza - Plan: Flu Vaccine QUAD High Dose(Fluad), CANCELED: Flu vaccine HIGH DOSE PF (Fluzone High dose)  History of prostate cancer  Type 2 diabetes mellitus without complication, without long-term current use of insulin (HCC) - Plan: POCT glycosylated hemoglobin (Hb A1C)  Hypertension associated with diabetes ([place])  Hyperlipidemia associated with type 2 diabetes mellitus (HCC)  Allergic rhinitis, mild  History of asthma  Former smoker, stopped smoking in distant past  Diabetic nephropathy associated with type 2 diabetes mellitus (HCC)  OAB (overactive bladder)  Atherosclerosis of aorta (HCC)  Malignant neoplasm of lower lobe of left lung (HCC)  Decreased appetite  History of partial ray amputation of right great toe (HCC)  Carpal tunnel syndrome, left upper limb At this point we will take maintain the status quo and not pursue further medications or treatment for carpal tunnel, OAB.  Did recommend  that he use the asthma inhaler on a regular basis.  He will continue on Metformin at the present dosing.  No further intervention concerning the prostate.   Immunization recommendations discussed.  Colonoscopy recommendations reviewed.   Medicare Attestation I have personally reviewed: The patient's medical and social history Their use of alcohol, tobacco or illicit drugs Their current medications and supplements The patient's functional ability including ADLs,fall risks, home safety risks, cognitive, and hearing and visual impairment Diet and physical activities Evidence for depression or mood disorders  The patient's weight, height, and BMI have been recorded in the chart.  I have made referrals, counseling, and provided education to the patient based on review of the above and I have provided the patient with a written personalized care plan for preventive services.     Jill Alexanders, MD   10/28/2019

## 2019-10-30 ENCOUNTER — Other Ambulatory Visit: Payer: Self-pay | Admitting: Family Medicine

## 2019-10-30 DIAGNOSIS — I6522 Occlusion and stenosis of left carotid artery: Secondary | ICD-10-CM

## 2019-11-05 ENCOUNTER — Ambulatory Visit: Payer: Medicare HMO | Admitting: Pulmonary Disease

## 2019-11-05 ENCOUNTER — Inpatient Hospital Stay: Payer: Medicare HMO

## 2019-11-05 ENCOUNTER — Encounter: Payer: Self-pay | Admitting: Internal Medicine

## 2019-11-05 ENCOUNTER — Inpatient Hospital Stay: Payer: Medicare HMO | Admitting: Internal Medicine

## 2019-11-05 ENCOUNTER — Other Ambulatory Visit: Payer: Self-pay

## 2019-11-05 VITALS — BP 135/61 | HR 72 | Temp 96.3°F | Resp 17 | Ht 67.0 in | Wt 156.4 lb

## 2019-11-05 DIAGNOSIS — C3432 Malignant neoplasm of lower lobe, left bronchus or lung: Secondary | ICD-10-CM

## 2019-11-05 DIAGNOSIS — Z5112 Encounter for antineoplastic immunotherapy: Secondary | ICD-10-CM | POA: Diagnosis not present

## 2019-11-05 LAB — CMP (CANCER CENTER ONLY)
ALT: 16 U/L (ref 0–44)
AST: 16 U/L (ref 15–41)
Albumin: 3.3 g/dL — ABNORMAL LOW (ref 3.5–5.0)
Alkaline Phosphatase: 61 U/L (ref 38–126)
Anion gap: 6 (ref 5–15)
BUN: 27 mg/dL — ABNORMAL HIGH (ref 8–23)
CO2: 29 mmol/L (ref 22–32)
Calcium: 9.4 mg/dL (ref 8.9–10.3)
Chloride: 102 mmol/L (ref 98–111)
Creatinine: 1.2 mg/dL (ref 0.61–1.24)
GFR, Est AFR Am: >60 mL/min (ref >60)
GFR, Estimated: 56 mL/min — ABNORMAL LOW (ref >60)
Glucose, Bld: 90 mg/dL (ref 70–99)
Potassium: 3.8 mmol/L (ref 3.5–5.1)
Sodium: 137 mmol/L (ref 135–145)
Total Bilirubin: 0.5 mg/dL (ref 0.3–1.2)
Total Protein: 7.5 g/dL (ref 6.5–8.1)

## 2019-11-05 LAB — CBC WITH DIFFERENTIAL (CANCER CENTER ONLY)
Abs Immature Granulocytes: 0.01 10*3/uL (ref 0.00–0.07)
Basophils Absolute: 0 10*3/uL (ref 0.0–0.1)
Basophils Relative: 1 %
Eosinophils Absolute: 0.2 10*3/uL (ref 0.0–0.5)
Eosinophils Relative: 3 %
HCT: 28.1 % — ABNORMAL LOW (ref 39.0–52.0)
Hemoglobin: 9.2 g/dL — ABNORMAL LOW (ref 13.0–17.0)
Immature Granulocytes: 0 %
Lymphocytes Relative: 28 %
Lymphs Abs: 1.5 10*3/uL (ref 0.7–4.0)
MCH: 30.3 pg (ref 26.0–34.0)
MCHC: 32.7 g/dL (ref 30.0–36.0)
MCV: 92.4 fL (ref 80.0–100.0)
Monocytes Absolute: 0.7 10*3/uL (ref 0.1–1.0)
Monocytes Relative: 13 %
Neutro Abs: 3 10*3/uL (ref 1.7–7.7)
Neutrophils Relative %: 55 %
Platelet Count: 331 10*3/uL (ref 150–400)
RBC: 3.04 MIL/uL — ABNORMAL LOW (ref 4.22–5.81)
RDW: 17.4 % — ABNORMAL HIGH (ref 11.5–15.5)
WBC Count: 5.4 10*3/uL (ref 4.0–10.5)
nRBC: 0 % (ref 0.0–0.2)

## 2019-11-05 LAB — TSH: TSH: 1.209 u[IU]/mL (ref 0.320–4.118)

## 2019-11-05 MED ORDER — SODIUM CHLORIDE 0.9 % IV SOLN
Freq: Once | INTRAVENOUS | Status: AC
  Filled 2019-11-05: qty 250

## 2019-11-05 MED ORDER — SODIUM CHLORIDE 0.9 % IV SOLN
360.0000 mg | Freq: Once | INTRAVENOUS | Status: AC
  Administered 2019-11-05: 360 mg via INTRAVENOUS
  Filled 2019-11-05: qty 12

## 2019-11-05 MED ORDER — HEPARIN SOD (PORK) LOCK FLUSH 100 UNIT/ML IV SOLN
500.0000 [IU] | Freq: Once | INTRAVENOUS | Status: AC | PRN
  Administered 2019-11-05: 500 [IU]
  Filled 2019-11-05: qty 5

## 2019-11-05 MED ORDER — SODIUM CHLORIDE 0.9% FLUSH
10.0000 mL | INTRAVENOUS | Status: DC | PRN
  Administered 2019-11-05: 10 mL
  Filled 2019-11-05: qty 10

## 2019-11-05 NOTE — Patient Instructions (Signed)
[facility] Discharge Instructions for Patients Receiving Chemotherapy  Today you received the following chemotherapy agents :  Opdivo.  To help prevent nausea and vomiting after your treatment, we encourage you to take your nausea medication as prescribed.   If you develop nausea and vomiting that is not controlled by your nausea medication, call the clinic.   BELOW ARE SYMPTOMS THAT SHOULD BE REPORTED IMMEDIATELY:  *FEVER GREATER THAN 100.5 F  *CHILLS WITH OR WITHOUT FEVER  NAUSEA AND VOMITING THAT IS NOT CONTROLLED WITH YOUR NAUSEA MEDICATION  *UNUSUAL SHORTNESS OF BREATH  *UNUSUAL BRUISING OR BLEEDING  TENDERNESS IN MOUTH AND THROAT WITH OR WITHOUT PRESENCE OF ULCERS  *URINARY PROBLEMS  *BOWEL PROBLEMS  UNUSUAL RASH Items with * indicate a potential emergency and should be followed up as soon as possible.  Feel free to call the clinic should you have any questions or concerns. The clinic phone number is [phone].  Please show the CHEMO ALERT CARD at check-in to the Emergency Department and triage nurse.   

## 2019-11-05 NOTE — Progress Notes (Signed)
[place] [phone]   [phone]  OFFICE PROGRESS NOTE  [name], [place] [address]  DIAGNOSIS: stage IV (T3, N2, M1 a) non-small cell lung cancer, squamous cell carcinoma presented with obstructive left lower lobe lung mass in addition to mediastinal lymphadenopathy as well as bilateral pulmonary nodules diagnosed in July 2021.  PDL1: 0%  PRIOR THERAPY: None  CURRENT THERAPY: 1) Palliative radiotherapy to the obstructive left lower lobe lung mass under the care of Dr. Lisbeth Renshaw.  2)  systemic chemotherapy 2 cycles of chemotherapy with carboplatin for an AUC of 5 and paclitaxel 175 mg/m2 in addition to immunotherapy with nivolumab 360 mg every 3 weeks and ipilimumab 1 mg/kg IV every 6 weeks followed by maintenance nivolumab and ipilimumab.  He started the first treatment on 09/04/2019.  He is status post 1 cycle of the treatment and he is here today for day 22 of cycle #2.  INTERVAL HISTORY: Shaun Kennedy 79 y.o. male returns to the clinic today for follow-up visit.  The patient is feeling fine today with no concerning complaints except for mild fatigue.  He denied having any current chest pain but has shortness of breath with exertion with no cough or hemoptysis.  He also has some numbness and tingling in the fingers.  Has some odynophagia from the previous treatment.  He denied having any chest pain or hemoptysis.  He has no nausea, vomiting, diarrhea or constipation.  He has no fever or chills.  He denied having any headache or visual changes.  He had CT angiogram of the chest performed at few weeks ago and he is here for evaluation and discussion of his scan results before resuming his treatment.   MEDICAL HISTORY: Past Medical History:  Diagnosis Date  . Allergic rhinitis   . Asthma   . Carotid stenosis   . Colon cancer ([place]) 2003  . Diabetes mellitus ([place])   . Diverticulosis   . Dyslipidemia   . ED  (erectile dysfunction)   . GERD (gastroesophageal reflux disease)   . H/O degenerative disc disease   . Hemorrhoids   . HTN (hypertension)   . Hyperlipidemia   . LVH (left ventricular hypertrophy)    on EKG  . Mass of lower lobe of left lung   . Mediastinal adenopathy   . Smoker    former  . Wears dentures   . Wears glasses     ALLERGIES:  has No Known Allergies.  MEDICATIONS:  Current Outpatient Medications  Medication Sig Dispense Refill  . amLODipine (NORVASC) 5 MG tablet TAKE 1 TABLET BY MOUTH EVERY DAY 90 tablet 3  . aspirin EC 81 MG tablet Take 81 mg by mouth daily after breakfast.     . Blood Glucose Monitoring Suppl (ONETOUCH VERIO) w/Device KIT USE TO CHECK SUGAR DAILY 1 kit 0  . Blood Glucose Monitoring Suppl (ONETOUCH VERIO) w/Device KIT USE TO CHECK SUGAR DAILY 1 kit 0  . cholecalciferol (VITAMIN D) 1000 units tablet Take 1,000 Units by mouth daily after breakfast.     . docusate sodium (COLACE) 100 MG capsule Take 1 capsule (100 mg total) by mouth 2 (two) times daily as needed for mild constipation. 15 capsule 0  . doxycycline (VIBRA-TABS) 100 MG tablet Take 1 tablet (100 mg total) by mouth 2 (two) times daily. 14 tablet 0  . dronabinol (MARINOL) 2.5 MG capsule TAKE 1 CAPSULE (2.5 MG TOTAL) BY MOUTH 2 (TWO)  TIMES DAILY BEFORE A MEAL. 30 capsule 1  . ferrous sulfate 325 (65 FE) MG EC tablet Take 1 tablet (325 mg total) by mouth daily with breakfast. 30 tablet 12  . guaiFENesin-codeine 100-10 MG/5ML syrup TAKE 5ML BY MOUTH 3 TIMES A DAY AS NEEDED FOR COUGH 240 mL 0  . Lancets (ONETOUCH DELICA PLUS [id number]) MISC PATIENT IS TO TEST TWO TIMES A DAY DX: E11.9 (ONETOUCH VERIO FLEX) 100 each 12  . lidocaine-prilocaine (EMLA) cream Apply to the Port-A-Cath site 30-60-minute before chemotherapy. 30 g 0  . lisinopril-hydrochlorothiazide (ZESTORETIC) 20-12.5 MG tablet Take 1 tablet by mouth daily. (Patient taking differently: Take 1 tablet by mouth daily after breakfast. ) 90  tablet 2  . LORazepam (ATIVAN) 0.5 MG tablet 1 tablet 30 minutes before the MRI.  Repeat once if needed. 2 tablet 0  . metFORMIN (GLUCOPHAGE-XR) 500 MG 24 hr tablet TAKE 1 TABLET BY MOUTH EVERY DAY (Patient taking differently: Take 500 mg by mouth daily after breakfast. ) 90 tablet 1  . mirabegron ER (MYRBETRIQ) 50 MG TB24 tablet Take 1 tablet (50 mg total) by mouth daily. (Patient not taking: Reported on 10/28/2019) 14 tablet 0  . mirtazapine (REMERON) 15 MG tablet TAKE 1 TABLET BY MOUTH EVERYDAY AT BEDTIME 90 tablet 1  . Multiple Vitamin (MULTIVITAMIN WITH MINERALS) TABS tablet Take 1 tablet by mouth daily after breakfast.     . ONETOUCH VERIO test strip USE AS INSTRUCTED TO CHECK TWICE DAILY 200 strip 5  . predniSONE (DELTASONE) 10 MG tablet Take 3 tablets (30 mg total) by mouth daily with breakfast for 7 days, THEN 2 tablets (20 mg total) daily with breakfast for 7 days, THEN 1 tablet (10 mg total) daily with breakfast for 7 days. 42 tablet 0  . prochlorperazine (COMPAZINE) 10 MG tablet Take 1 tablet (10 mg total) by mouth every 6 (six) hours as needed for nausea or vomiting. 30 tablet 0  . simvastatin (ZOCOR) 40 MG tablet Take 1 tablet (40 mg total) by mouth daily. (Patient taking differently: Take 40 mg by mouth every evening. ) 90 tablet 3  . sucralfate (CARAFATE) 1 GM/10ML suspension Take 10 mLs (1 g total) by mouth 4 (four) times daily -  with meals and at bedtime. 420 mL 0  . Tiotropium Bromide-Olodaterol (STIOLTO RESPIMAT) 2.5-2.5 MCG/ACT AERS Inhale 2 puffs into the lungs daily. (Patient not taking: Reported on 10/28/2019) 4 g 0   No current facility-administered medications for this visit.   Facility-Administered Medications Ordered in Other Visits  Medication Dose Route Frequency Provider Last Rate Last Admin  . sodium chloride flush (NS) 0.9 % injection 10 mL  10 mL Intracatheter PRN Curt Bears, MD   10 mL at 09/04/19 1556    SURGICAL HISTORY:  Past Surgical History:   Procedure Laterality Date  . BRONCHIAL BIOPSY  08/20/2019   Procedure: BRONCHIAL BIOPSIES;  Surgeon: Collene Gobble, MD;  Location: [facility] ENDOSCOPY;  Service: Pulmonary;;  . BRONCHIAL BRUSHINGS  08/20/2019   Procedure: BRONCHIAL BRUSHINGS;  Surgeon: Collene Gobble, MD;  Location: [facility] ENDOSCOPY;  Service: Pulmonary;;  . BRONCHIAL NEEDLE ASPIRATION BIOPSY  08/20/2019   Procedure: BRONCHIAL NEEDLE ASPIRATION BIOPSIES;  Surgeon: Collene Gobble, MD;  Location: [facility] ENDOSCOPY;  Service: Pulmonary;;  . CARPAL TUNNEL RELEASE Right 01/09/2019   Procedure: CARPAL TUNNEL RELEASE;  Surgeon: Leandrew Koyanagi, MD;  Location: [place];  Service: Orthopedics;  Laterality: Right;  . CATARACT EXTRACTION Right 2018  . COLONOSCOPY  2007   Dr. Benson Norway  . I & D EXTREMITY Right 04/02/2016   Procedure: IRRIGATION AND DEBRIDEMENT GREAT TOE;  Surgeon: Leandrew Koyanagi, MD;  Location: [place];  Service: Orthopedics;  Laterality: Right;  . IR IMAGING GUIDED PORT INSERTION  08/30/2019  . MULTIPLE TOOTH EXTRACTIONS    . RADIOACTIVE SEED IMPLANT  2003  . ULNAR TUNNEL RELEASE Right 01/09/2019   Procedure: RIGHT CUBITAL TUNNEL RELEASE AND CARPAL TUNNEL RELEASE;  Surgeon: Leandrew Koyanagi, MD;  Location: [place];  Service: Orthopedics;  Laterality: Right;  . UPPER GASTROINTESTINAL ENDOSCOPY    . VIDEO BRONCHOSCOPY WITH ENDOBRONCHIAL ULTRASOUND N/A 08/20/2019   Procedure: VIDEO BRONCHOSCOPY WITH ENDOBRONCHIAL ULTRASOUND;  Surgeon: Collene Gobble, MD;  Location: [facility] ENDOSCOPY;  Service: Pulmonary;  Laterality: N/A;    REVIEW OF SYSTEMS:  Constitutional: positive for fatigue Eyes: negative Ears, nose, mouth, throat, and face: negative Respiratory: positive for dyspnea on exertion Cardiovascular: negative Gastrointestinal: negative Genitourinary:negative Integument/breast: negative Hematologic/lymphatic: negative Musculoskeletal:positive for arthralgias Neurological: positive for  paresthesia Behavioral/Psych: negative Endocrine: negative Allergic/Immunologic: negative   PHYSICAL EXAMINATION: General appearance: alert, cooperative, fatigued and no distress Head: Normocephalic, without obvious abnormality, atraumatic Neck: no adenopathy, no JVD, supple, symmetrical, trachea midline and thyroid not enlarged, symmetric, no tenderness/mass/nodules Lymph nodes: Cervical, supraclavicular, and axillary nodes normal. Resp: clear to auscultation bilaterally Back: symmetric, no curvature. ROM normal. No CVA tenderness. Cardio: regular rate and rhythm, S1, S2 normal, no murmur, click, rub or gallop GI: soft, non-tender; bowel sounds normal; no masses,  no organomegaly Extremities: extremities normal, atraumatic, no cyanosis or edema Neurologic: Alert and oriented X 3, normal strength and tone. Normal symmetric reflexes. Normal coordination and gait  ECOG PERFORMANCE STATUS: 1 - Symptomatic but completely ambulatory  Blood pressure 135/61, pulse 72, temperature (!) 96.3 F (35.7 C), temperature source Tympanic, resp. rate 17, height 5' 7" (1.702 m), weight 156 lb 6.4 oz (70.9 kg), SpO2 100 %.  LABORATORY DATA: Lab Results  Component Value Date   WBC 5.4 11/05/2019   HGB 9.2 (L) 11/05/2019   HCT 28.1 (L) 11/05/2019   MCV 92.4 11/05/2019   PLT 331 11/05/2019      Chemistry      Component Value Date/Time   NA 137 11/05/2019 1030   NA 132 (L) 08/14/2019 0918   K 3.8 11/05/2019 1030   CL 102 11/05/2019 1030   CO2 29 11/05/2019 1030   BUN 27 (H) 11/05/2019 1030   BUN 16 08/14/2019 0918   CREATININE 1.20 11/05/2019 1030   CREATININE 1.25 (H) 08/08/2016 0912      Component Value Date/Time   CALCIUM 9.4 11/05/2019 1030   ALKPHOS 61 11/05/2019 1030   AST 16 11/05/2019 1030   ALT 16 11/05/2019 1030   BILITOT 0.5 11/05/2019 1030       RADIOGRAPHIC STUDIES: DG Chest 2 View  Result Date: 10/18/2019 CLINICAL DATA:  Shortness of breath EXAM: CHEST - 2 VIEW  COMPARISON:  October 15, 2019 chest radiograph; PET-CT September 13, 2019 FINDINGS: Port-A-Cath tip is in the superior vena cava. No pneumothorax. There is ill-defined opacity in the posterior segment of the left upper lobe consistent with a focus of pneumonia. The previously noted lesion in the left lower lobe seen on PET study is not appreciable on current radiographic examination. Pulmonary nodular opacity seen on recent PET study not convincingly seen on current chest radiographic examination. Heart size and pulmonary vascularity are normal. Question a degree of left hilar adenopathy, better appreciated  on prior CT. No bone lesions. IMPRESSION: Apparent infiltrate in posterior segment left upper lobe. Previous pulmonary nodular lesions and left lower lobe mass not appreciable by radiography currently. Question a degree of left hilar adenopathy. Heart size normal. Port-A-Cath tip in superior vena cava. Electronically Signed   By: Lowella Grip III M.D.   On: 10/18/2019 12:24   DG Chest 2 View  Result Date: 10/15/2019 CLINICAL DATA:  Shortness of breath.  Non-small cell lung cancer. EXAM: CHEST - 2 VIEW COMPARISON:  PET-CT 09/13/2019, chest radiograph 08/20/2019 and chest CT 08/16/2019. FINDINGS: New right IJ Port-A-Cath extends to the level of the mid SVC. Interval improved mediastinal and hilar adenopathy. The posterior left hilar and superior segment left lower lobe mass has decreased in size. No other definite residual visible pulmonary nodules. There is mild interstitial prominence and mild central airway thickening. No pleural effusion or pneumothorax. The bones appear unremarkable. IMPRESSION: 1. Mild interstitial prominence could reflect bronchitis or treatment-related changes and contribute to the patient's symptoms. 2. Interval improvement in previously demonstrated left perihilar mass and adenopathy consistent with response to therapy. Electronically Signed   By: Richardean Sale M.D.   On:  10/15/2019 15:01   CT Angio Chest PE W and/or Wo Contrast  Result Date: 10/18/2019 CLINICAL DATA:  PE suspected, high prob Shortness of breath. History of lung cancer diagnosed 2 months ago. Chemotherapy and radiation. EXAM: CT ANGIOGRAPHY CHEST WITH CONTRAST TECHNIQUE: Multidetector CT imaging of the chest was performed using the standard protocol during bolus administration of intravenous contrast. Multiplanar CT image reconstructions and MIPs were obtained to evaluate the vascular anatomy. CONTRAST:  31m OMNIPAQUE IOHEXOL 350 MG/ML SOLN COMPARISON:  Radiograph earlier today. PET CT 09/13/2019. contrast-enhanced chest CT 08/16/2019 FINDINGS: Cardiovascular: There are no filling defects within the pulmonary arteries to suggest pulmonary embolus. Some of the left lower lobe pulmonary arteries are attenuated by left infrahilar mass but no evidence of filling defect or invasion. Right internal jugular chest port tip in the SVC. Aortic atherosclerosis without dissection or acute aortic findings. Heart is normal in size. There are coronary artery calcifications. No pericardial effusion. Mediastinum/Nodes: Improved mediastinal adenopathy from prior exam. Residual necrotic prevascular node measures 7 mm short axis, previously 12 mm. The previous right paratracheal adenopathy is not well assessed on the current exam due to adjacent streak artifact, however lower paratracheal node measures approximately 13 mm, series 6, image 66, previously 20 mm on my retrospective measurement. There is an enlarged subcarinal node spanning 19 mm that is unchanged, this was not hypermetabolic on PET. No new or progressive adenopathy. Suggestion of diffuse esophageal wall thickening. Lungs/Pleura: Left lower lobe infrahilar nodule has diminished in size currently measuring 4.4 x 2.8 cm with interval central necrosis. Surrounding irregular soft tissue density with spiculated margins extend to the fissure. Similar small left pleural  effusion and prior exam. The majority of the small multifocal pulmonary nodules in both lungs have diminished in size or resolved in the interim. There are small residual pulmonary nodules, largest in the right middle lobe measuring 5 mm, series 8, image 94, and left lung apex measuring 7 mm, series 8, image 37. However, there is definite progression of a pleural based nodule in the left lower lobe that measures 14 x 11 mm, series 8 image 100, that previously measured 8 mm. There are hypoventilatory changes in the dependent lungs. Mild emphysema. No findings of pulmonary edema. Trachea and central bronchi are patent. No right pleural effusion. Upper Abdomen: No acute  upper abdominal findings. There is a large volume of stool in the included colon. Musculoskeletal: No focal bone lesion or acute osseous abnormalities. Review of the MIP images confirms the above findings. IMPRESSION: 1. No pulmonary embolus. 2. Recent diagnosis of left lung cancer with positive response to treatment with improved mediastinal adenopathy, improved multifocal pulmonary nodules, and decreased size of the primary left lower lobe necrotic mass from prior PET. However, there is increased size of a single left pleural based lower lobe pulmonary nodule from prior exam. 3. Similar small left pleural effusion. 4. Suggestion of diffuse esophageal wall thickening, can be seen with esophagitis, possibly radiation induced. Aortic Atherosclerosis (ICD10-I70.0) and Emphysema (ICD10-J43.9). Electronically Signed   By: Keith Rake M.D.   On: 10/18/2019 15:31    ASSESSMENT AND PLAN: This is a very pleasant 79 years old [name] with stage IV non-small cell lung cancer, squamous cell carcinoma with negative PD-L1 expression. The patient is currently undergoing systemic chemotherapy with carboplatin and paclitaxel for 2 cycles in addition to immunotherapy with ipilimumab 1 mg/KG every 6 weeks and nivolumab 360 mg IV every 3 weeks  status post 1 cycle and he is currently undergoing cycle #2. He had CT angiogram of the chest performed few weeks ago that showed improvement of his disease with improvement of the mediastinal adenopathy as well as improved multifocal pulmonary nodules and decrease in the size of the primary left lower lobe necrotic mass.  There was increased size of a single left pleural-based lower lobe pulmonary nodule that need close attention on the upcoming imaging studies. I discussed the scan results with the patient and recommended for him to continue his current treatment with immunotherapy and he will proceed with day #22 of cycle #2 today as planned. For the odynophagia, the patient will continue his current treatment with Carafate. For the constipation he is currently on MiraLAX and milk of magnesia as needed. For the peripheral neuropathy, I will continue to monitor for now and consider the patient for treatment with gabapentin or Lyrica if no improvement of his symptoms. He will come back for follow-up visit in 3 weeks for evaluation before the next cycle of his treatment. The patient was advised to call immediately if he has any concerning symptoms in the interval. The patient voices understanding of current disease status and treatment options and is in agreement with the current care plan.  All questions were answered. The patient knows to call the clinic with any problems, questions or concerns. We can certainly see the patient much sooner if necessary.  Disclaimer: This note was dictated with voice recognition software. Similar sounding words can inadvertently be transcribed and may not be corrected upon review.

## 2019-11-06 ENCOUNTER — Encounter: Payer: Self-pay | Admitting: Pulmonary Disease

## 2019-11-06 ENCOUNTER — Ambulatory Visit: Payer: Medicare HMO

## 2019-11-06 ENCOUNTER — Telehealth: Payer: Self-pay | Admitting: Internal Medicine

## 2019-11-06 ENCOUNTER — Telehealth: Payer: Self-pay | Admitting: Pulmonary Disease

## 2019-11-06 ENCOUNTER — Ambulatory Visit (INDEPENDENT_AMBULATORY_CARE_PROVIDER_SITE_OTHER): Payer: Medicare HMO

## 2019-11-06 ENCOUNTER — Ambulatory Visit: Payer: Medicare HMO | Admitting: Pulmonary Disease

## 2019-11-06 VITALS — BP 130/66 | HR 104 | Temp 98.0°F | Ht 67.5 in | Wt 155.8 lb

## 2019-11-06 DIAGNOSIS — R0602 Shortness of breath: Secondary | ICD-10-CM

## 2019-11-06 DIAGNOSIS — C3432 Malignant neoplasm of lower lobe, left bronchus or lung: Secondary | ICD-10-CM

## 2019-11-06 DIAGNOSIS — R918 Other nonspecific abnormal finding of lung field: Secondary | ICD-10-CM | POA: Diagnosis not present

## 2019-11-06 DIAGNOSIS — I700 Atherosclerosis of aorta: Secondary | ICD-10-CM | POA: Diagnosis not present

## 2019-11-06 MED ORDER — STIOLTO RESPIMAT 2.5-2.5 MCG/ACT IN AERS: 2.0000 | INHALATION_SPRAY | Freq: Every day | RESPIRATORY_TRACT | 0 refills | Status: DC

## 2019-11-06 MED ORDER — STIOLTO RESPIMAT 2.5-2.5 MCG/ACT IN AERS: 2.0000 | INHALATION_SPRAY | Freq: Every day | RESPIRATORY_TRACT | 5 refills | Status: DC

## 2019-11-06 MED ORDER — PREDNISONE 10 MG PO TABS: ORAL_TABLET | ORAL | 0 refills | Status: DC

## 2019-11-06 NOTE — Patient Instructions (Addendum)
You were seen today by Lauraine Rinne, NP  for:   1. Shortness of breath  - DG Chest 2 View; Future - predniSONE (DELTASONE) 10 MG tablet; Take 3 tablets (30 mg total) by mouth daily with breakfast for 7 days, THEN 2 tablets (20 mg total) daily with breakfast for 7 days, THEN 1 tablet (10 mg total) daily with breakfast for 7 days.  Dispense: 42 tablet; Refill: 0  Stiolto Respimat inhaler >>>2 puffs daily >>>Take this no matter what >>>This is not a rescue inhaler  Complete the breathing test as scheduled in October/2021 with our office  2. Abnormal findings on diagnostic imaging of lung  - DG Chest 2 View; Future - predniSONE (DELTASONE) 10 MG tablet; Take 3 tablets (30 mg total) by mouth daily with breakfast for 7 days, THEN 2 tablets (20 mg total) daily with breakfast for 7 days, THEN 1 tablet (10 mg total) daily with breakfast for 7 days.  Dispense: 42 tablet; Refill: 0  3. Malignant neoplasm of lower lobe of left lung [facility])  Keep follow-up with oncology   We recommend today:  Orders Placed This Encounter  Procedures  . DG Chest 2 View    Standing Status:   Future    Standing Expiration Date:   03/07/2020    Order Specific Question:   Reason for Exam (SYMPTOM  OR DIAGNOSIS REQUIRED)    Answer:   short of breath    Order Specific Question:   Preferred imaging location?    Answer:   Internal    Order Specific Question:   Radiology Contrast Protocol - do NOT remove file path    Answer:   [email or web address]   Orders Placed This Encounter  Procedures  . DG Chest 2 View   Meds ordered this encounter  Medications  . Tiotropium Bromide-Olodaterol (STIOLTO RESPIMAT) 2.5-2.5 MCG/ACT AERS    Sig: Inhale 2 puffs into the lungs daily.    Dispense:  4 g    Refill:  5  . Tiotropium Bromide-Olodaterol (STIOLTO RESPIMAT) 2.5-2.5 MCG/ACT AERS    Sig: Inhale 2 puffs into the lungs daily.    Dispense:  4 g    Refill:  0    Order  Specific Question:   Lot Number?    Answer:   [id number]    Order Specific Question:   Expiration Date?    Answer:   10/07/2021    Order Specific Question:   Quantity    Answer:   1  . predniSONE (DELTASONE) 10 MG tablet    Sig: Take 3 tablets (30 mg total) by mouth daily with breakfast for 7 days, THEN 2 tablets (20 mg total) daily with breakfast for 7 days, THEN 1 tablet (10 mg total) daily with breakfast for 7 days.    Dispense:  42 tablet    Refill:  0    Follow Up:    Return in about 2 weeks (around 11/20/2019), or if symptoms worsen or fail to improve, for Follow up with Dr. Lamonte Sakai, Follow up for FULL PFT - 60 min.   Notification of test results are managed in the following manner: If there are  any recommendations or changes to the  plan of care discussed in office today,  we will contact you and let you know what they are. If you do not hear from [place], then your results are normal and you can view them through your  MyChart account , or a letter will be sent to you. Thank  you again for trusting [place] with your care  - Thank you, [facility]    It is flu season:   >>> Best ways to protect herself from the flu: Receive the yearly flu vaccine, practice good hand hygiene washing with soap and also using hand sanitizer when available, eat a nutritious meals, get adequate rest, hydrate appropriately       Please contact the office if your symptoms worsen or you have concerns that you are not improving.   Thank you for choosing [facility] for your healthcare, and for allowing [place] to partner with you on your healthcare journey. I am thankful to be able to provide care to you today.   Wyn Quaker FNP-C

## 2019-11-06 NOTE — Progress Notes (Signed)
'@Patient'  ID: Shaun Kennedy, male    DOB: [birth year], 79 y.o.   MRN: [id number]  Chief Complaint  Patient presents with  . Follow-up    sob with exertion. Stiolto was helping when he could use it right.  He ran out Monday after a dose.    Referring provider: [name], MD  HPI:  79 year old male former smoker followed in our office for left lower lobe lung mass, diagnosed with squamous cell lung cancer in July/2021  PMH: Diabetes, hypertension, hyperlipidemia Smoker/ Smoking History: Former smoker.  Quit 1989.  32-pack-year smoking history. Maintenance: None Pt of: Dr. Lamonte Sakai  11/06/2019  - Visit   79 year old male former smoker followed in our office for left lower lobe lung mass diagnosed with squamous cell lung cancer in July/2021.  He is followed by Dr. Lamonte Sakai.  He is completing follow-up with our office after last being seen on 10/15/2019.  Plan of care from that visit was as follows to keep follow-up with oncology and Dr. Earlie Server.  Patient was prescribed prednisone taper and encouraged to start [place].  He had significant upper airway stridor.  Patient completed most recent office visit with oncology on 11/05/2019.  Assessment and plan from that visit with Dr. Julien Nordmann was as follows:  ASSESSMENT AND PLAN: This is a very pleasant 79 years old [name] with stage IV non-small cell lung cancer, squamous cell carcinoma with negative PD-L1 expression. The patient is currently undergoing systemic chemotherapy with carboplatin and paclitaxel for 2 cycles in addition to immunotherapy with ipilimumab 1 mg/KG every 6 weeks and nivolumab 360 mg IV every 3 weeks status post 1 cycle and he is currently undergoing cycle #2. He had CT angiogram of the chest performed few weeks ago that showed improvement of his disease with improvement of the mediastinal adenopathy as well as improved multifocal pulmonary nodules and decrease in the size of the primary left lower lobe necrotic  mass.  There was increased size of a single left pleural-based lower lobe pulmonary nodule that need close attention on the upcoming imaging studies. I discussed the scan results with the patient and recommended for him to continue his current treatment with immunotherapy and he will proceed with day #22 of cycle #2 today as planned. For the odynophagia, the patient will continue his current treatment with Carafate. For the constipation he is currently on MiraLAX and milk of magnesia as needed. For the peripheral neuropathy, I will continue to monitor for now and consider the patient for treatment with gabapentin or Lyrica if no improvement of his symptoms. He will come back for follow-up visit in 3 weeks for evaluation before the next cycle of his treatment. The patient was advised to call immediately if he has any concerning symptoms in the interval. The patient voices understanding of current disease status and treatment options and is in agreement with the current care plan.   Patient was contacted after his emergency room visit to start an additional prednisone taper after radiation oncology reviewed CT imaging feeling the patient was dealing with a pneumonitis.  Patient reports that he never started this treatment.  Symptoms of shortness of breath worsened after finishing his first prednisone taper.  We will discuss this today.  Patient has already received his COVID-19 booster shot.  He is up-to-date with disease seasonal flu vaccine.  He did feel that the Stiolto Respimat inhaler was helpful when he was able to successfully use it.  He ran out of this as well.  We will discuss this.  Questionaires / Pulmonary Flowsheets:   ACT:  No flowsheet data found.  MMRC: mMRC Dyspnea Scale mMRC Score  11/06/2019 3    Epworth:  No flowsheet data found.  Tests:   08/16/2019-CT chest with contrast-6.7 x 4.8 cm heterogeneous low-attenuation left lower lobe lung mass consistent with primary lung  malignancy  10/09/2019-CBC with differential-hemoglobin 7.8  09/13/2019-PET scan-large patchy necrotic left lower lobe mass is hypermetabolic and consistent with known lung neoplasm, extensive mediastinal and hilar metastatic adenopathy and extensive pulmonary metastatic nodules, hypermetabolic right supraclavicular lymph nodes  10/15/2019-chest x-ray-mild interstitial prominence could reflect bronchitis or treatment-related changes and contribute to the patient's symptoms, interval improvement in the previously demonstrated left perihilar mass and adenopathy consistent with response to therapy   FENO:  No results found for: NITRICOXIDE  PFT: No flowsheet data found.  WALK:  SIX MIN WALK 10/15/2019 08/27/2019  Supplimental Oxygen during Test? (L/min) No No  Tech Comments: - Average pace with no desats. Tolerated walk well.    Imaging: DG Chest 2 View  Result Date: 10/18/2019 CLINICAL DATA:  Shortness of breath EXAM: CHEST - 2 VIEW COMPARISON:  October 15, 2019 chest radiograph; PET-CT September 13, 2019 FINDINGS: Port-A-Cath tip is in the superior vena cava. No pneumothorax. There is ill-defined opacity in the posterior segment of the left upper lobe consistent with a focus of pneumonia. The previously noted lesion in the left lower lobe seen on PET study is not appreciable on current radiographic examination. Pulmonary nodular opacity seen on recent PET study not convincingly seen on current chest radiographic examination. Heart size and pulmonary vascularity are normal. Question a degree of left hilar adenopathy, better appreciated on prior CT. No bone lesions. IMPRESSION: Apparent infiltrate in posterior segment left upper lobe. Previous pulmonary nodular lesions and left lower lobe mass not appreciable by radiography currently. Question a degree of left hilar adenopathy. Heart size normal. Port-A-Cath tip in superior vena cava. Electronically Signed   By: Lowella Grip III M.D.   On: 10/18/2019  12:24   DG Chest 2 View  Result Date: 10/15/2019 CLINICAL DATA:  Shortness of breath.  Non-small cell lung cancer. EXAM: CHEST - 2 VIEW COMPARISON:  PET-CT 09/13/2019, chest radiograph 08/20/2019 and chest CT 08/16/2019. FINDINGS: New right IJ Port-A-Cath extends to the level of the mid SVC. Interval improved mediastinal and hilar adenopathy. The posterior left hilar and superior segment left lower lobe mass has decreased in size. No other definite residual visible pulmonary nodules. There is mild interstitial prominence and mild central airway thickening. No pleural effusion or pneumothorax. The bones appear unremarkable. IMPRESSION: 1. Mild interstitial prominence could reflect bronchitis or treatment-related changes and contribute to the patient's symptoms. 2. Interval improvement in previously demonstrated left perihilar mass and adenopathy consistent with response to therapy. Electronically Signed   By: Richardean Sale M.D.   On: 10/15/2019 15:01   CT Angio Chest PE W and/or Wo Contrast  Result Date: 10/18/2019 CLINICAL DATA:  PE suspected, high prob Shortness of breath. History of lung cancer diagnosed 2 months ago. Chemotherapy and radiation. EXAM: CT ANGIOGRAPHY CHEST WITH CONTRAST TECHNIQUE: Multidetector CT imaging of the chest was performed using the standard protocol during bolus administration of intravenous contrast. Multiplanar CT image reconstructions and MIPs were obtained to evaluate the vascular anatomy. CONTRAST:  38m OMNIPAQUE IOHEXOL 350 MG/ML SOLN COMPARISON:  Radiograph earlier today. PET CT 09/13/2019. contrast-enhanced chest CT 08/16/2019 FINDINGS: Cardiovascular: There are no filling defects within the pulmonary arteries to  suggest pulmonary embolus. Some of the left lower lobe pulmonary arteries are attenuated by left infrahilar mass but no evidence of filling defect or invasion. Right internal jugular chest port tip in the SVC. Aortic atherosclerosis without dissection or acute  aortic findings. Heart is normal in size. There are coronary artery calcifications. No pericardial effusion. Mediastinum/Nodes: Improved mediastinal adenopathy from prior exam. Residual necrotic prevascular node measures 7 mm short axis, previously 12 mm. The previous right paratracheal adenopathy is not well assessed on the current exam due to adjacent streak artifact, however lower paratracheal node measures approximately 13 mm, series 6, image 66, previously 20 mm on my retrospective measurement. There is an enlarged subcarinal node spanning 19 mm that is unchanged, this was not hypermetabolic on PET. No new or progressive adenopathy. Suggestion of diffuse esophageal wall thickening. Lungs/Pleura: Left lower lobe infrahilar nodule has diminished in size currently measuring 4.4 x 2.8 cm with interval central necrosis. Surrounding irregular soft tissue density with spiculated margins extend to the fissure. Similar small left pleural effusion and prior exam. The majority of the small multifocal pulmonary nodules in both lungs have diminished in size or resolved in the interim. There are small residual pulmonary nodules, largest in the right middle lobe measuring 5 mm, series 8, image 94, and left lung apex measuring 7 mm, series 8, image 37. However, there is definite progression of a pleural based nodule in the left lower lobe that measures 14 x 11 mm, series 8 image 100, that previously measured 8 mm. There are hypoventilatory changes in the dependent lungs. Mild emphysema. No findings of pulmonary edema. Trachea and central bronchi are patent. No right pleural effusion. Upper Abdomen: No acute upper abdominal findings. There is a large volume of stool in the included colon. Musculoskeletal: No focal bone lesion or acute osseous abnormalities. Review of the MIP images confirms the above findings. IMPRESSION: 1. No pulmonary embolus. 2. Recent diagnosis of left lung cancer with positive response to treatment with  improved mediastinal adenopathy, improved multifocal pulmonary nodules, and decreased size of the primary left lower lobe necrotic mass from prior PET. However, there is increased size of a single left pleural based lower lobe pulmonary nodule from prior exam. 3. Similar small left pleural effusion. 4. Suggestion of diffuse esophageal wall thickening, can be seen with esophagitis, possibly radiation induced. Aortic Atherosclerosis (ICD10-I70.0) and Emphysema (ICD10-J43.9). Electronically Signed   By: Keith Rake M.D.   On: 10/18/2019 15:31    Lab Results:  CBC    Component Value Date/Time   WBC 5.4 11/05/2019 1030   WBC 9.5 10/18/2019 1240   RBC 3.04 (L) 11/05/2019 1030   HGB 9.2 (L) 11/05/2019 1030   HGB 9.8 (L) 08/14/2019 0918   HCT 28.1 (L) 11/05/2019 1030   HCT 30.1 (L) 08/14/2019 0918   PLT 331 11/05/2019 1030   PLT 549 (H) 08/14/2019 0918   MCV 92.4 11/05/2019 1030   MCV 87 08/14/2019 0918   MCH 30.3 11/05/2019 1030   MCHC 32.7 11/05/2019 1030   RDW 17.4 (H) 11/05/2019 1030   RDW 11.8 08/14/2019 0918   LYMPHSABS 1.5 11/05/2019 1030   LYMPHSABS 1.8 08/14/2019 0918   MONOABS 0.7 11/05/2019 1030   EOSABS 0.2 11/05/2019 1030   EOSABS 0.3 08/14/2019 0918   BASOSABS 0.0 11/05/2019 1030   BASOSABS 0.0 08/14/2019 0918    BMET    Component Value Date/Time   NA 137 11/05/2019 1030   NA 132 (L) 08/14/2019 0918   K 3.8  11/05/2019 1030   CL 102 11/05/2019 1030   CO2 29 11/05/2019 1030   GLUCOSE 90 11/05/2019 1030   BUN 27 (H) 11/05/2019 1030   BUN 16 08/14/2019 0918   CREATININE 1.20 11/05/2019 1030   CREATININE 1.25 (H) 08/08/2016 0912   CALCIUM 9.4 11/05/2019 1030   GFRNONAA 56 (L) 11/05/2019 1030   GFRAA >60 11/05/2019 1030    BNP    Component Value Date/Time   BNP 107.5 (H) 10/18/2019 1240    ProBNP No results found for: PROBNP  Specialty Problems      Pulmonary Problems   Allergic rhinitis, mild   Lung cancer (HCC)   Malignant neoplasm of lower lobe  of left lung (HCC)   Shortness of breath      No Known Allergies  Immunization History  Administered Date(s) Administered  . Fluad Quad(high Dose 65+) 10/17/2018, 10/28/2019  . Influenza, High Dose Seasonal PF 12/02/2015, 12/12/2016, 11/02/2017  . PFIZER SARS-COV-2 Vaccination 04/05/2019, 05/01/2019, 10/28/2019  . Pneumococcal Conjugate-13 06/25/2014  . Pneumococcal Polysaccharide-23 06/03/2010  . Tdap 12/06/2007  . Zoster 02/08/2008  . Zoster Recombinat (Shingrix) 02/25/2017, 04/26/2017    Past Medical History:  Diagnosis Date  . Allergic rhinitis   . Asthma   . Carotid stenosis   . Colon cancer ([place]) 2003  . Diabetes mellitus ([place])   . Diverticulosis   . Dyslipidemia   . ED (erectile dysfunction)   . GERD (gastroesophageal reflux disease)   . H/O degenerative disc disease   . Hemorrhoids   . HTN (hypertension)   . Hyperlipidemia   . LVH (left ventricular hypertrophy)    on EKG  . Mass of lower lobe of left lung   . Mediastinal adenopathy   . Smoker    former  . Wears dentures   . Wears glasses     Tobacco History: Social History   Tobacco Use  Smoking Status Former Smoker  . Packs/day: 1.00  . Years: 32.00  . Pack years: 32.00  . Start date: 02/08/1955  . Quit date: 10/09/1987  . Years since quitting: 32.0  Smokeless Tobacco Never Used   Counseling given: Not Answered   Continue to not smoke  Outpatient Encounter Medications as of 11/06/2019  Medication Sig  . amLODipine (NORVASC) 5 MG tablet TAKE 1 TABLET BY MOUTH EVERY DAY  . aspirin EC 81 MG tablet Take 81 mg by mouth daily after breakfast.   . Blood Glucose Monitoring Suppl (ONETOUCH VERIO) w/Device KIT USE TO CHECK SUGAR DAILY  . Blood Glucose Monitoring Suppl (ONETOUCH VERIO) w/Device KIT USE TO CHECK SUGAR DAILY  . cholecalciferol (VITAMIN D) 1000 units tablet Take 1,000 Units by mouth daily after breakfast.   . docusate sodium (COLACE) 100 MG capsule Take 1 capsule (100 mg total) by mouth 2  (two) times daily as needed for mild constipation.  Marland Kitchen dronabinol (MARINOL) 2.5 MG capsule TAKE 1 CAPSULE (2.5 MG TOTAL) BY MOUTH 2 (TWO) TIMES DAILY BEFORE A MEAL.  . ferrous sulfate 325 (65 FE) MG EC tablet Take 1 tablet (325 mg total) by mouth daily with breakfast.  . guaiFENesin-codeine 100-10 MG/5ML syrup TAKE 5ML BY MOUTH 3 TIMES A DAY AS NEEDED FOR COUGH  . Lancets (ONETOUCH DELICA PLUS EXHBZJ69C) MISC PATIENT IS TO TEST TWO TIMES A DAY DX: E11.9 (ONETOUCH VERIO FLEX)  . lidocaine-prilocaine (EMLA) cream Apply to the Port-A-Cath site 30-60-minute before chemotherapy.  Marland Kitchen lisinopril-hydrochlorothiazide (ZESTORETIC) 20-12.5 MG tablet Take 1 tablet by mouth daily. (Patient taking differently: Take  1 tablet by mouth daily after breakfast. )  . LORazepam (ATIVAN) 0.5 MG tablet 1 tablet 30 minutes before the MRI.  Repeat once if needed.  . metFORMIN (GLUCOPHAGE-XR) 500 MG 24 hr tablet TAKE 1 TABLET BY MOUTH EVERY DAY (Patient taking differently: Take 500 mg by mouth daily after breakfast. )  . mirabegron ER (MYRBETRIQ) 50 MG TB24 tablet Take 1 tablet (50 mg total) by mouth daily.  . mirtazapine (REMERON) 15 MG tablet TAKE 1 TABLET BY MOUTH EVERYDAY AT BEDTIME  . Multiple Vitamin (MULTIVITAMIN WITH MINERALS) TABS tablet Take 1 tablet by mouth daily after breakfast.   . ONETOUCH VERIO test strip USE AS INSTRUCTED TO CHECK TWICE DAILY  . simvastatin (ZOCOR) 40 MG tablet Take 1 tablet (40 mg total) by mouth daily. (Patient taking differently: Take 40 mg by mouth every evening. )  . predniSONE (DELTASONE) 10 MG tablet Take 3 tablets (30 mg total) by mouth daily with breakfast for 7 days, THEN 2 tablets (20 mg total) daily with breakfast for 7 days, THEN 1 tablet (10 mg total) daily with breakfast for 7 days.  . prochlorperazine (COMPAZINE) 10 MG tablet Take 1 tablet (10 mg total) by mouth every 6 (six) hours as needed for nausea or vomiting. (Patient not taking: Reported on 11/06/2019)  . sucralfate  (CARAFATE) 1 GM/10ML suspension Take 10 mLs (1 g total) by mouth 4 (four) times daily -  with meals and at bedtime.  . Tiotropium Bromide-Olodaterol (STIOLTO RESPIMAT) 2.5-2.5 MCG/ACT AERS Inhale 2 puffs into the lungs daily. (Patient not taking: Reported on 11/06/2019)  . Tiotropium Bromide-Olodaterol (STIOLTO RESPIMAT) 2.5-2.5 MCG/ACT AERS Inhale 2 puffs into the lungs daily.  . Tiotropium Bromide-Olodaterol (STIOLTO RESPIMAT) 2.5-2.5 MCG/ACT AERS Inhale 2 puffs into the lungs daily.  . [DISCONTINUED] doxycycline (VIBRA-TABS) 100 MG tablet Take 1 tablet (100 mg total) by mouth 2 (two) times daily. (Patient not taking: Reported on 11/06/2019)  . [DISCONTINUED] LINZESS 72 MCG capsule Take 72 mcg by mouth every morning. (Patient not taking: Reported on 11/06/2019)  . [DISCONTINUED] predniSONE (DELTASONE) 10 MG tablet Take 3 tablets (30 mg total) by mouth daily with breakfast for 7 days, THEN 2 tablets (20 mg total) daily with breakfast for 7 days, THEN 1 tablet (10 mg total) daily with breakfast for 7 days. (Patient not taking: Reported on 11/06/2019)   Facility-Administered Encounter Medications as of 11/06/2019  Medication  . sodium chloride flush (NS) 0.9 % injection 10 mL     Review of Systems  Review of Systems  Constitutional: Positive for fatigue. Negative for activity change, chills, fever and unexpected weight change.  HENT: Negative for postnasal drip, rhinorrhea, sinus pressure, sinus pain and sore throat.   Eyes: Negative.   Respiratory: Positive for shortness of breath, wheezing and stridor. Negative for cough.   Cardiovascular: Negative for chest pain and palpitations.  Gastrointestinal: Negative for constipation, diarrhea, nausea and vomiting.  Endocrine: Negative.   Genitourinary: Negative.   Musculoskeletal: Negative.   Skin: Negative.   Neurological: Negative for dizziness and headaches.  Psychiatric/Behavioral: Negative.  Negative for dysphoric mood. The patient is not  nervous/anxious.   All other systems reviewed and are negative.    Physical Exam  BP 130/66 (BP Location: Left Arm, Patient Position: Sitting, Cuff Size: Normal)   Pulse (!) 104   Temp 98 F (36.7 C)   Ht 5' 7.5" (1.715 m)   Wt 155 lb 12.8 oz (70.7 kg)   SpO2 97%   BMI 24.04 kg/m  Wt Readings from Last 5 Encounters:  11/06/19 155 lb 12.8 oz (70.7 kg)  11/05/19 156 lb 6.4 oz (70.9 kg)  10/28/19 152 lb 6.4 oz (69.1 kg)  10/16/19 156 lb (70.8 kg)  10/15/19 157 lb 1.6 oz (71.3 kg)    BMI Readings from Last 5 Encounters:  11/06/19 24.04 kg/m  11/05/19 24.50 kg/m  10/28/19 23.87 kg/m  10/16/19 24.43 kg/m  10/15/19 24.24 kg/m     Physical Exam Vitals and nursing note reviewed.  Constitutional:      General: He is not in acute distress.    Appearance: Normal appearance. He is obese.  HENT:     Head: Normocephalic and atraumatic.     Right Ear: Hearing and external ear normal.     Left Ear: Hearing and external ear normal.     Nose: Nose normal. No mucosal edema or rhinorrhea.     Right Turbinates: Not enlarged.     Left Turbinates: Not enlarged.  Eyes:     Pupils: Pupils are equal, round, and reactive to light.  Neck:     Thyroid: No thyroid mass or thyromegaly.  Cardiovascular:     Rate and Rhythm: Normal rate and regular rhythm.     Pulses: Normal pulses.     Heart sounds: Normal heart sounds. No murmur heard.   Pulmonary:     Effort: Pulmonary effort is normal.     Breath sounds: Stridor present. Wheezing present. No decreased breath sounds or rales.  Musculoskeletal:     Cervical back: Normal range of motion.     Right lower leg: No edema.     Left lower leg: No edema.  Lymphadenopathy:     Cervical: No cervical adenopathy.  Skin:    General: Skin is warm and dry.     Capillary Refill: Capillary refill takes less than 2 seconds.     Findings: No erythema or rash.  Neurological:     General: No focal deficit present.     Mental Status: He is alert  and oriented to person, place, and time.     Motor: No weakness.     Coordination: Coordination normal.     Gait: Gait is intact. Gait normal.  Psychiatric:        Mood and Affect: Mood normal.        Behavior: Behavior normal. Behavior is cooperative.        Thought Content: Thought content normal.        Judgment: Judgment normal.       Assessment & Plan:   Malignant neoplasm of lower lobe of left lung (HCC) Plan: Keep follow-up with oncology   Shortness of breath Suspect shortness of breath continues to be multifactorial given suspected pneumonitis on CT imaging, left lower lobe malignant neoplasm currently being treated by oncology, and suspected COPD.  Patient continues to have upper airway wheeze/stridor.  Plan: Chest x-ray today 3-week prednisone taper to start today which was originally ordered after patient's last emergency room visit Resume Stiolto Respimat Obtain pulmonary function testing next month with follow-up with Dr. Lamonte Sakai  Abnormal findings on diagnostic imaging of lung Radiation oncology felt the patient potentially is having pneumonitis Patient was instructed to start prednisone taper but this never happened  Plan: Start prednisone taper today Chest x-ray today We will route notes to radiation oncology and Dr. Earlie Server    Return in about 2 weeks (around 11/20/2019), or if symptoms worsen or fail to improve, for Follow up with Dr. Lamonte Sakai,  Follow up for FULL PFT - 60 min.   Lauraine Rinne, NP 11/06/2019   This appointment required 35 minutes of patient care (this includes precharting, chart review, review of results, face-to-face care, etc.).

## 2019-11-06 NOTE — Assessment & Plan Note (Signed)
Radiation oncology felt the patient potentially is having pneumonitis Patient was instructed to start prednisone taper but this never happened  Plan: Start prednisone taper today Chest x-ray today We will route notes to radiation oncology and Dr. Earlie Server

## 2019-11-06 NOTE — Telephone Encounter (Signed)
Called and spoke with patient's wife, [name], listed on DPR, she advised the Stiolto inhaler was over $200.  Advised I would send to our pharmacy team to see what is covered under the preferred list for his insurance.  Advised patient's wife we will let them know when we determine what will be covered.  He is to use the Stiolto sample in the meantime.   Pharmacy team, please advise on what is covered under patient's preferred inhalers.  Thank you.

## 2019-11-06 NOTE — Assessment & Plan Note (Signed)
Suspect shortness of breath continues to be multifactorial given suspected pneumonitis on CT imaging, left lower lobe malignant neoplasm currently being treated by oncology, and suspected COPD.  Patient continues to have upper airway wheeze/stridor.  Plan: Chest x-ray today 3-week prednisone taper to start today which was originally ordered after patient's last emergency room visit Resume Stiolto Respimat Obtain pulmonary function testing next month with follow-up with Dr. Lamonte Sakai

## 2019-11-06 NOTE — Telephone Encounter (Signed)
Added additional cycles per 9/28 los - pt to get an updated schedule next visit.

## 2019-11-06 NOTE — Assessment & Plan Note (Signed)
Plan:  Keep follow up with oncology 

## 2019-11-07 ENCOUNTER — Telehealth: Payer: Self-pay | Admitting: *Deleted

## 2019-11-07 MED ORDER — PREDNISONE 10 MG PO TABS: ORAL_TABLET | ORAL | 0 refills | Status: AC

## 2019-11-07 MED ORDER — ANORO ELLIPTA 62.5-25 MCG/INH IN AEPB: 1.0000 | INHALATION_SPRAY | Freq: Every day | RESPIRATORY_TRACT | 1 refills | Status: DC

## 2019-11-07 NOTE — Telephone Encounter (Signed)
[name] wife states Prednisone RX was not at pharmacy. Pharmacy is [facility]. [name] phone number is [phone].

## 2019-11-07 NOTE — Telephone Encounter (Signed)
Aaron Edelman please advise if you have a preference for this patient as the Stiolto was over $200.  Anoro and Bevespi both have $47.00 copays for 1 month supply.

## 2019-11-07 NOTE — Telephone Encounter (Signed)
Anoro and Bevespi both have $47.00 copays for 1 month supply.

## 2019-11-07 NOTE — Telephone Encounter (Signed)
Called pharmacy to see if they got prescription yesterday for Prednisone. They state they did not. RX resent to pharmacy. Patient called and notified. Nothing further needed at this.

## 2019-11-07 NOTE — Telephone Encounter (Signed)
I am pretty sure there is a separate message regarding this.  I have already sent a message stating that he could start Anoro Ellipta.  With 1 refill and this can be further evaluated at next office visit with Dr. Lamonte Sakai next month.  Anoro Ellipta  >>> Take 1 puff daily in the morning right when you wake up >>>Rinse your mouth out after use >>>This is a daily maintenance inhaler, NOT a rescue inhaler >>>Contact our office if you are having difficulties affording or obtaining this medication >>>It is important for you to be able to take this daily and not miss any doses   1 refill  Wyn Quaker, FNP

## 2019-11-07 NOTE — Telephone Encounter (Signed)
[facility] sent over a fax stating that the patients insurance will not cover the stiolto, but will cover Anoro or Bevespi.   Shaun Kennedy please advise. Thanks

## 2019-11-07 NOTE — Telephone Encounter (Signed)
Doffing with either. Patient may prefer daily anoro dosing.   Anoro Ellipta  >>> Take 1 puff daily in the morning right when you wake up >>>Rinse your mouth out after use >>>This is a daily maintenance inhaler, NOT a rescue inhaler >>>Contact our office if you are having difficulties affording or obtaining this medication >>>It is important for you to be able to take this daily and not miss any doses   1 refill.   This can be addressed next month with Dr. Lamonte Sakai.   Wyn Quaker FNP

## 2019-11-07 NOTE — Telephone Encounter (Signed)
Called and spoke with pt letting him know the info stated by Aaron Edelman that he wanted to switch him to [name] as it was preferred by insurance. Pt verbalized understanding. Pt needed Rx to be sent to mail order pharmacy. I have sent Rx to pharmacy for pt. Nothing further needed.

## 2019-11-08 ENCOUNTER — Telehealth: Payer: Self-pay

## 2019-11-08 ENCOUNTER — Telehealth: Payer: Self-pay | Admitting: Emergency Medicine

## 2019-11-08 ENCOUNTER — Other Ambulatory Visit: Payer: Self-pay | Admitting: Gastroenterology

## 2019-11-08 NOTE — Telephone Encounter (Signed)
My chart message was already done .

## 2019-11-08 NOTE — Telephone Encounter (Signed)
Spoke with patient's daughter regarding patient SOB.Patient was instructed how to use inhaler Anoro when pateint was in the office on 11/06/19 and seen Aaron Edelman. Patient stated his sample inhaler is not working.Advised patient to come by the office to be shown how to use his inhaler. Patient will come by the office this afternoon. Nothing else further needed.

## 2019-11-08 NOTE — Telephone Encounter (Signed)
Call daughter @ [phone].Shaun Kennedy

## 2019-11-08 NOTE — Telephone Encounter (Signed)
Patient came into office and we reviewed how to use his Stiolto inhaler and we also went over how to use the Anoro inhaler for when he receives it. Patient demonstrated both of them back to me and expressed he had no further questions. Nothing further needed at this time.

## 2019-11-17 ENCOUNTER — Other Ambulatory Visit: Payer: Self-pay | Admitting: Internal Medicine

## 2019-11-21 ENCOUNTER — Ambulatory Visit: Payer: Medicare HMO | Admitting: Emergency Medicine

## 2019-11-21 ENCOUNTER — Ambulatory Visit: Payer: Medicare HMO

## 2019-11-21 ENCOUNTER — Other Ambulatory Visit: Payer: Self-pay

## 2019-11-21 LAB — PULMONARY FUNCTION TEST
DL/VA % pred: 145 %
DL/VA: 5.71 ml/min/mmHg/L
DLCO cor % pred: 110 %
DLCO cor: 24.69 ml/min/mmHg
DLCO unc % pred: 88 %
DLCO unc: 19.88 ml/min/mmHg
RV % pred: 105 %
RV: 2.68 L
TLC % pred: 83 %
TLC: 5.49 L

## 2019-11-25 ENCOUNTER — Other Ambulatory Visit: Payer: Self-pay | Admitting: Pulmonary Disease

## 2019-11-27 ENCOUNTER — Inpatient Hospital Stay: Payer: Medicare HMO

## 2019-11-27 ENCOUNTER — Encounter: Payer: Self-pay | Admitting: Internal Medicine

## 2019-11-27 ENCOUNTER — Other Ambulatory Visit: Payer: Self-pay

## 2019-11-27 ENCOUNTER — Inpatient Hospital Stay: Payer: Medicare HMO | Attending: Internal Medicine | Admitting: Internal Medicine

## 2019-11-27 ENCOUNTER — Other Ambulatory Visit: Payer: Self-pay | Admitting: Internal Medicine

## 2019-11-27 ENCOUNTER — Telehealth: Payer: Self-pay | Admitting: Internal Medicine

## 2019-11-27 VITALS — BP 145/85 | HR 89 | Temp 97.8°F | Resp 18 | Ht 67.5 in | Wt 165.5 lb

## 2019-11-27 DIAGNOSIS — I10 Essential (primary) hypertension: Secondary | ICD-10-CM | POA: Insufficient documentation

## 2019-11-27 DIAGNOSIS — C3432 Malignant neoplasm of lower lobe, left bronchus or lung: Secondary | ICD-10-CM | POA: Diagnosis not present

## 2019-11-27 DIAGNOSIS — Z85038 Personal history of other malignant neoplasm of large intestine: Secondary | ICD-10-CM | POA: Insufficient documentation

## 2019-11-27 DIAGNOSIS — T451X5A Adverse effect of antineoplastic and immunosuppressive drugs, initial encounter: Secondary | ICD-10-CM

## 2019-11-27 DIAGNOSIS — E119 Type 2 diabetes mellitus without complications: Secondary | ICD-10-CM | POA: Diagnosis not present

## 2019-11-27 DIAGNOSIS — Z87891 Personal history of nicotine dependence: Secondary | ICD-10-CM | POA: Insufficient documentation

## 2019-11-27 DIAGNOSIS — Z7984 Long term (current) use of oral hypoglycemic drugs: Secondary | ICD-10-CM | POA: Insufficient documentation

## 2019-11-27 DIAGNOSIS — Z5112 Encounter for antineoplastic immunotherapy: Secondary | ICD-10-CM

## 2019-11-27 DIAGNOSIS — Z79899 Other long term (current) drug therapy: Secondary | ICD-10-CM | POA: Diagnosis not present

## 2019-11-27 DIAGNOSIS — J45909 Unspecified asthma, uncomplicated: Secondary | ICD-10-CM | POA: Insufficient documentation

## 2019-11-27 DIAGNOSIS — C3490 Malignant neoplasm of unspecified part of unspecified bronchus or lung: Secondary | ICD-10-CM

## 2019-11-27 DIAGNOSIS — Z95828 Presence of other vascular implants and grafts: Secondary | ICD-10-CM

## 2019-11-27 DIAGNOSIS — D6481 Anemia due to antineoplastic chemotherapy: Secondary | ICD-10-CM

## 2019-11-27 DIAGNOSIS — Z7982 Long term (current) use of aspirin: Secondary | ICD-10-CM | POA: Diagnosis not present

## 2019-11-27 DIAGNOSIS — E785 Hyperlipidemia, unspecified: Secondary | ICD-10-CM | POA: Insufficient documentation

## 2019-11-27 DIAGNOSIS — Z7952 Long term (current) use of systemic steroids: Secondary | ICD-10-CM | POA: Diagnosis not present

## 2019-11-27 DIAGNOSIS — K219 Gastro-esophageal reflux disease without esophagitis: Secondary | ICD-10-CM | POA: Insufficient documentation

## 2019-11-27 LAB — CBC WITH DIFFERENTIAL (CANCER CENTER ONLY)
Abs Immature Granulocytes: 0.01 10*3/uL (ref 0.00–0.07)
Basophils Absolute: 0 10*3/uL (ref 0.0–0.1)
Basophils Relative: 0 %
Eosinophils Absolute: 0.2 10*3/uL (ref 0.0–0.5)
Eosinophils Relative: 2 %
HCT: 29.9 % — ABNORMAL LOW (ref 39.0–52.0)
Hemoglobin: 9.9 g/dL — ABNORMAL LOW (ref 13.0–17.0)
Immature Granulocytes: 0 %
Lymphocytes Relative: 18 %
Lymphs Abs: 1.3 10*3/uL (ref 0.7–4.0)
MCH: 31.3 pg (ref 26.0–34.0)
MCHC: 33.1 g/dL (ref 30.0–36.0)
MCV: 94.6 fL (ref 80.0–100.0)
Monocytes Absolute: 0.7 10*3/uL (ref 0.1–1.0)
Monocytes Relative: 10 %
Neutro Abs: 5.2 10*3/uL (ref 1.7–7.7)
Neutrophils Relative %: 70 %
Platelet Count: 278 10*3/uL (ref 150–400)
RBC: 3.16 MIL/uL — ABNORMAL LOW (ref 4.22–5.81)
RDW: 15.8 % — ABNORMAL HIGH (ref 11.5–15.5)
WBC Count: 7.5 10*3/uL (ref 4.0–10.5)
nRBC: 0 % (ref 0.0–0.2)

## 2019-11-27 LAB — CMP (CANCER CENTER ONLY)
ALT: 12 U/L (ref 0–44)
AST: 14 U/L — ABNORMAL LOW (ref 15–41)
Albumin: 3.3 g/dL — ABNORMAL LOW (ref 3.5–5.0)
Alkaline Phosphatase: 48 U/L (ref 38–126)
Anion gap: 6 (ref 5–15)
BUN: 16 mg/dL (ref 8–23)
CO2: 29 mmol/L (ref 22–32)
Calcium: 9.6 mg/dL (ref 8.9–10.3)
Chloride: 104 mmol/L (ref 98–111)
Creatinine: 0.88 mg/dL (ref 0.61–1.24)
GFR, Estimated: >60 mL/min (ref >60)
Glucose, Bld: 103 mg/dL — ABNORMAL HIGH (ref 70–99)
Potassium: 3.5 mmol/L (ref 3.5–5.1)
Sodium: 139 mmol/L (ref 135–145)
Total Bilirubin: 0.4 mg/dL (ref 0.3–1.2)
Total Protein: 7.2 g/dL (ref 6.5–8.1)

## 2019-11-27 LAB — TSH: TSH: 1.09 u[IU]/mL (ref 0.320–4.118)

## 2019-11-27 MED ORDER — HEPARIN SOD (PORK) LOCK FLUSH 100 UNIT/ML IV SOLN
500.0000 [IU] | Freq: Once | INTRAVENOUS | Status: AC | PRN
  Administered 2019-11-27: 500 [IU]
  Filled 2019-11-27: qty 5

## 2019-11-27 MED ORDER — FAMOTIDINE IN NACL 20-0.9 MG/50ML-% IV SOLN
20.0000 mg | Freq: Once | INTRAVENOUS | Status: AC
  Administered 2019-11-27: 20 mg via INTRAVENOUS

## 2019-11-27 MED ORDER — SODIUM CHLORIDE 0.9% FLUSH
10.0000 mL | Freq: Once | INTRAVENOUS | Status: AC
  Administered 2019-11-27: 10 mL
  Filled 2019-11-27: qty 10

## 2019-11-27 MED ORDER — SODIUM CHLORIDE 0.9 % IV SOLN
360.0000 mg | Freq: Once | INTRAVENOUS | Status: AC
  Administered 2019-11-27: 360 mg via INTRAVENOUS
  Filled 2019-11-27: qty 12

## 2019-11-27 MED ORDER — FAMOTIDINE IN NACL 20-0.9 MG/50ML-% IV SOLN
INTRAVENOUS | Status: AC
  Filled 2019-11-27: qty 50

## 2019-11-27 MED ORDER — DIPHENHYDRAMINE HCL 50 MG/ML IJ SOLN
25.0000 mg | Freq: Once | INTRAMUSCULAR | Status: AC
  Administered 2019-11-27: 25 mg via INTRAVENOUS

## 2019-11-27 MED ORDER — SODIUM CHLORIDE 0.9 % IV SOLN
Freq: Once | INTRAVENOUS | Status: AC
  Filled 2019-11-27: qty 250

## 2019-11-27 MED ORDER — SODIUM CHLORIDE 0.9 % IV SOLN
1.0000 mg/kg | Freq: Once | INTRAVENOUS | Status: AC
  Administered 2019-11-27: 70 mg via INTRAVENOUS
  Filled 2019-11-27: qty 14

## 2019-11-27 MED ORDER — DIPHENHYDRAMINE HCL 50 MG/ML IJ SOLN
INTRAMUSCULAR | Status: AC
  Filled 2019-11-27: qty 1

## 2019-11-27 MED ORDER — SODIUM CHLORIDE 0.9% FLUSH
10.0000 mL | INTRAVENOUS | Status: DC | PRN
  Administered 2019-11-27: 10 mL
  Filled 2019-11-27: qty 10

## 2019-11-27 NOTE — Telephone Encounter (Signed)
No new appointments scheduled. Patient already has next two treatment cycles scheduled. Gave patient updated calendar.

## 2019-11-27 NOTE — Progress Notes (Signed)
[place] [phone]   [phone]  OFFICE PROGRESS NOTE  [name], [address] [address]  DIAGNOSIS: stage IV (T3, N2, M1 a) non-small cell lung cancer, squamous cell carcinoma presented with obstructive left lower lobe lung mass in addition to mediastinal lymphadenopathy as well as bilateral pulmonary nodules diagnosed in July 2021.  PDL1: 0%  PRIOR THERAPY: None  CURRENT THERAPY: 1) Palliative radiotherapy to the obstructive left lower lobe lung mass under the care of Dr. Lisbeth Renshaw.  2)  systemic chemotherapy 2 cycles of chemotherapy with carboplatin for an AUC of 5 and paclitaxel 175 mg/m2 in addition to immunotherapy with nivolumab 360 mg every 3 weeks and ipilimumab 1 mg/kg IV every 6 weeks followed by maintenance nivolumab and ipilimumab.  He started the first treatment on 09/04/2019.  He is status post 2 cycles of the treatment.  INTERVAL HISTORY: Shaun Kennedy 79 y.o. male returns to the clinic today for follow-up visit accompanied by his wife. The patient is feeling fine today with no concerning complaints except for the baseline shortness of breath increased with exertion. He denied having any current chest pain, or hemoptysis but has mild cough. He has no recent weight loss or night sweats. He has no nausea, vomiting, diarrhea or constipation. He denied having any headache or visual changes. He continues to tolerate his maintenance treatment with nivolumab and ipilimumab fairly well. He is here today for evaluation before starting cycle #3.   MEDICAL HISTORY: Past Medical History:  Diagnosis Date   Allergic rhinitis    Asthma    Carotid stenosis    Colon cancer ([place]) 2003   Diabetes mellitus ([place])    Diverticulosis    Dyslipidemia    ED (erectile dysfunction)    GERD (gastroesophageal reflux disease)    H/O degenerative disc disease    Hemorrhoids    HTN (hypertension)     Hyperlipidemia    LVH (left ventricular hypertrophy)    on EKG   Mass of lower lobe of left lung    Mediastinal adenopathy    Smoker    former   Wears dentures    Wears glasses     ALLERGIES:  has No Known Allergies.  MEDICATIONS:  Current Outpatient Medications  Medication Sig Dispense Refill   amLODipine (NORVASC) 5 MG tablet TAKE 1 TABLET BY MOUTH EVERY DAY 90 tablet 3   aspirin EC 81 MG tablet Take 81 mg by mouth daily after breakfast.      Blood Glucose Monitoring Suppl (ONETOUCH VERIO) w/Device KIT USE TO CHECK SUGAR DAILY 1 kit 0   Blood Glucose Monitoring Suppl (ONETOUCH VERIO) w/Device KIT USE TO CHECK SUGAR DAILY 1 kit 0   cholecalciferol (VITAMIN D) 1000 units tablet Take 1,000 Units by mouth daily after breakfast.      docusate sodium (COLACE) 100 MG capsule Take 1 capsule (100 mg total) by mouth 2 (two) times daily as needed for mild constipation. 15 capsule 0   dronabinol (MARINOL) 2.5 MG capsule TAKE 1 CAPSULE (2.5 MG TOTAL) BY MOUTH 2 (TWO) TIMES DAILY BEFORE A MEAL. 30 capsule 1   ferrous sulfate 325 (65 FE) MG EC tablet Take 1 tablet (325 mg total) by mouth daily with breakfast. 30 tablet 12   guaiFENesin-codeine 100-10 MG/5ML syrup TAKE 5ML BY MOUTH 3 TIMES A DAY AS NEEDED FOR COUGH 240 mL 0   Lancets (ONETOUCH DELICA PLUS [id number]) MISC PATIENT IS TO TEST  TWO TIMES A DAY DX: E11.9 (ONETOUCH VERIO FLEX) 100 each 12   lidocaine-prilocaine (EMLA) cream Apply to the Port-A-Cath site 30-60-minute before chemotherapy. 30 g 0   LINZESS 72 MCG capsule Take 72 mcg by mouth every morning.     lisinopril-hydrochlorothiazide (ZESTORETIC) 20-12.5 MG tablet Take 1 tablet by mouth daily. (Patient taking differently: Take 1 tablet by mouth daily after breakfast. ) 90 tablet 2   LORazepam (ATIVAN) 0.5 MG tablet 1 tablet 30 minutes before the MRI.  Repeat once if needed. 2 tablet 0   metFORMIN (GLUCOPHAGE-XR) 500 MG 24 hr tablet TAKE 1 TABLET BY MOUTH EVERY DAY  (Patient taking differently: Take 500 mg by mouth daily after breakfast. ) 90 tablet 1   mirabegron ER (MYRBETRIQ) 50 MG TB24 tablet Take 1 tablet (50 mg total) by mouth daily. 14 tablet 0   mirtazapine (REMERON) 15 MG tablet TAKE 1 TABLET BY MOUTH EVERYDAY AT BEDTIME 90 tablet 1   Multiple Vitamin (MULTIVITAMIN WITH MINERALS) TABS tablet Take 1 tablet by mouth daily after breakfast.      ONETOUCH VERIO test strip USE AS INSTRUCTED TO CHECK TWICE DAILY 200 strip 5   predniSONE (DELTASONE) 10 MG tablet Take 3 tablets (30 mg total) by mouth daily with breakfast for 7 days, THEN 2 tablets (20 mg total) daily with breakfast for 7 days, THEN 1 tablet (10 mg total) daily with breakfast for 7 days. 42 tablet 0   prochlorperazine (COMPAZINE) 10 MG tablet Take 1 tablet (10 mg total) by mouth every 6 (six) hours as needed for nausea or vomiting. (Patient not taking: Reported on 11/06/2019) 30 tablet 0   simvastatin (ZOCOR) 40 MG tablet Take 1 tablet (40 mg total) by mouth daily. (Patient taking differently: Take 40 mg by mouth every evening. ) 90 tablet 3   sucralfate (CARAFATE) 1 GM/10ML suspension TAKE 10 MLS BY MOUTH 4 TIMES DAILY - WITH MEALS AND AT BEDTIME. 420 mL 0   umeclidinium-vilanterol (ANORO ELLIPTA) 62.5-25 MCG/INH AEPB Inhale 1 puff into the lungs daily. 90 each 1   No current facility-administered medications for this visit.   Facility-Administered Medications Ordered in Other Visits  Medication Dose Route Frequency Provider Last Rate Last Admin   sodium chloride flush (NS) 0.9 % injection 10 mL  10 mL Intracatheter PRN Curt Bears, MD   10 mL at 09/04/19 1556    SURGICAL HISTORY:  Past Surgical History:  Procedure Laterality Date   BRONCHIAL BIOPSY  08/20/2019   Procedure: BRONCHIAL BIOPSIES;  Surgeon: Collene Gobble, MD;  Location: [facility] ENDOSCOPY;  Service: Pulmonary;;   BRONCHIAL BRUSHINGS  08/20/2019   Procedure: BRONCHIAL BRUSHINGS;  Surgeon: Collene Gobble, MD;   Location: [facility] ENDOSCOPY;  Service: Pulmonary;;   BRONCHIAL NEEDLE ASPIRATION BIOPSY  08/20/2019   Procedure: BRONCHIAL NEEDLE ASPIRATION BIOPSIES;  Surgeon: Collene Gobble, MD;  Location: [facility];  Service: Pulmonary;;   CARPAL TUNNEL RELEASE Right 01/09/2019   Procedure: CARPAL TUNNEL RELEASE;  Surgeon: Leandrew Koyanagi, MD;  Location: [place];  Service: Orthopedics;  Laterality: Right;   CATARACT EXTRACTION Right 2018   COLONOSCOPY  2007   Dr. Benson Norway   I & D EXTREMITY Right 04/02/2016   Procedure: IRRIGATION AND DEBRIDEMENT GREAT TOE;  Surgeon: Leandrew Koyanagi, MD;  Location: [facility];  Service: Orthopedics;  Laterality: Right;   IR IMAGING GUIDED PORT INSERTION  08/30/2019   MULTIPLE TOOTH EXTRACTIONS     RADIOACTIVE SEED IMPLANT  2003  ULNAR TUNNEL RELEASE Right 01/09/2019   Procedure: RIGHT CUBITAL TUNNEL RELEASE AND CARPAL TUNNEL RELEASE;  Surgeon: Leandrew Koyanagi, MD;  Location: [facility];  Service: Orthopedics;  Laterality: Right;   UPPER GASTROINTESTINAL ENDOSCOPY     VIDEO BRONCHOSCOPY WITH ENDOBRONCHIAL ULTRASOUND N/A 08/20/2019   Procedure: VIDEO BRONCHOSCOPY WITH ENDOBRONCHIAL ULTRASOUND;  Surgeon: Collene Gobble, MD;  Location: [facility] ENDOSCOPY;  Service: Pulmonary;  Laterality: N/A;    REVIEW OF SYSTEMS:  A comprehensive review of systems was negative except for: Constitutional: positive for fatigue Respiratory: positive for cough and dyspnea on exertion   PHYSICAL EXAMINATION: General appearance: alert, cooperative, fatigued and no distress Head: Normocephalic, without obvious abnormality, atraumatic Neck: no adenopathy, no JVD, supple, symmetrical, trachea midline and thyroid not enlarged, symmetric, no tenderness/mass/nodules Lymph nodes: Cervical, supraclavicular, and axillary nodes normal. Resp: clear to auscultation bilaterally Back: symmetric, no curvature. ROM normal. No CVA tenderness. Cardio: regular rate and rhythm, S1, S2 normal, no  murmur, click, rub or gallop GI: soft, non-tender; bowel sounds normal; no masses,  no organomegaly Extremities: extremities normal, atraumatic, no cyanosis or edema  ECOG PERFORMANCE STATUS: 1 - Symptomatic but completely ambulatory  Blood pressure (!) 145/85, pulse 89, temperature 97.8 F (36.6 C), temperature source Tympanic, resp. rate 18, height 5' 7.5" (1.715 m), weight 165 lb 8 oz (75.1 kg), SpO2 100 %.  LABORATORY DATA: Lab Results  Component Value Date   WBC 7.5 11/27/2019   HGB 9.9 (L) 11/27/2019   HCT 29.9 (L) 11/27/2019   MCV 94.6 11/27/2019   PLT 278 11/27/2019      Chemistry      Component Value Date/Time   NA 139 11/27/2019 1026   NA 132 (L) 08/14/2019 0918   K 3.5 11/27/2019 1026   CL 104 11/27/2019 1026   CO2 29 11/27/2019 1026   BUN 16 11/27/2019 1026   BUN 16 08/14/2019 0918   CREATININE 0.88 11/27/2019 1026   CREATININE 1.25 (H) 08/08/2016 0912      Component Value Date/Time   CALCIUM 9.6 11/27/2019 1026   ALKPHOS 48 11/27/2019 1026   AST 14 (L) 11/27/2019 1026   ALT 12 11/27/2019 1026   BILITOT 0.4 11/27/2019 1026       RADIOGRAPHIC STUDIES: DG Chest 2 View  Result Date: 11/07/2019 CLINICAL DATA:  Shortness of breath. EXAM: CHEST - 2 VIEW COMPARISON:  Chest plain film and chest CT a, dated October 18, 2019. FINDINGS: There is stable right-sided venous Port-A-Cath positioning. Mild, stable diffuse chronic appearing increased interstitial lung markings are seen. A stable ill-defined opacity is again noted within the superior segment of the left lower lobe. There is no evidence of a pleural effusion or pneumothorax. The heart size and mediastinal contours are within normal limits. There is marked severity calcification of the aortic arch. The visualized skeletal structures are unremarkable. IMPRESSION: 1. Chronic appearing increased interstitial lung markings with additional findings consistent with the patient's known left lower lobe lung mass.  Electronically Signed   By: Virgina Norfolk M.D.   On: 11/07/2019 21:57    ASSESSMENT AND PLAN: This is a very pleasant 79 years old [name] with stage IV non-small cell lung cancer, squamous cell carcinoma with negative PD-L1 expression. The patient is currently undergoing systemic chemotherapy with carboplatin and paclitaxel for 2 cycles in addition to immunotherapy with ipilimumab 1 mg/KG every 6 weeks and nivolumab 360 mg IV every 3 weeks status post 2 cycles.  The patient continues to tolerate this  treatment well with no concerning adverse effects. I recommended for him to proceed with day 1 of cycle #3 today as planned. I will see him back for follow-up visit in 3 weeks for evaluation with repeat CT scan of the chest, abdomen pelvis for restaging of his disease. The patient was advised to call immediately if he has any concerning symptoms in the interval. The patient voices understanding of current disease status and treatment options and is in agreement with the current care plan.  All questions were answered. The patient knows to call the clinic with any problems, questions or concerns. We can certainly see the patient much sooner if necessary.  Disclaimer: This note was dictated with voice recognition software. Similar sounding words can inadvertently be transcribed and may not be corrected upon review.

## 2019-11-27 NOTE — Progress Notes (Signed)
Nutrition Follow-up:  Patient with stage IV non small cell lung cancer.  Patient receiving yervoy and opdivo at this time.    Met with patient during infusion.  Patient reports that he has a good appetite.  Eating eggs and sausage and cereal for breakfast.  Lunch is usually sandwich and ensure. Dinner is meat and vegetables, had spaghetti last night.  Also drinks smoothies with fruit, ice cream and milk and protein powder.     Medications: reviewed  Labs: reviewed  Anthropometrics:   Weight 165 lb 8 oz today increased from 156 lb on 9/8   NUTRITION DIAGNOSIS: Inadequate oral intake continues   INTERVENTION:  Encouraged patient to continue eating high calorie, high protein foods Encouraged oral nutrition supplements as able. Provided coupons today.  Encouraged patient to reach out to RD if needed in the future   NEXT VISIT: no follow-up planned with good appetite and weight gain.   RD available if something changes in nutritional status  Dajanee Voorheis B. Zenia Resides, [place], [place] Registered Dietitian [phone] (mobile)

## 2019-11-27 NOTE — Patient Instructions (Signed)
[place] Discharge Instructions for Patients Receiving Chemotherapy  Today you received the following chemotherapy agents: Rae Halsted  To help prevent nausea and vomiting after your treatment, we encourage you to take your nausea medication as directed.    If you develop nausea and vomiting that is not controlled by your nausea medication, call the clinic.   BELOW ARE SYMPTOMS THAT SHOULD BE REPORTED IMMEDIATELY:  *FEVER GREATER THAN 100.5 F  *CHILLS WITH OR WITHOUT FEVER  NAUSEA AND VOMITING THAT IS NOT CONTROLLED WITH YOUR NAUSEA MEDICATION  *UNUSUAL SHORTNESS OF BREATH  *UNUSUAL BRUISING OR BLEEDING  TENDERNESS IN MOUTH AND THROAT WITH OR WITHOUT PRESENCE OF ULCERS  *URINARY PROBLEMS  *BOWEL PROBLEMS  UNUSUAL RASH Items with * indicate a potential emergency and should be followed up as soon as possible.  Feel free to call the clinic should you have any questions or concerns. The clinic phone number is [phone].  Please show the [place] at check-in to the Emergency Department and triage nurse.

## 2019-11-29 ENCOUNTER — Telehealth: Payer: Self-pay

## 2019-11-29 NOTE — Telephone Encounter (Signed)
-----   Message from Ardeen Garland, RN sent at 11/28/2019  1:13 PM EDT ----- Regarding: call pt please   From family  "Sorry we do not need the letter however I thought he was doing better? Why is he unable to travel?  Clydene Laming, MD 16 hours ago (8:20 PM)  Good evening my dad said he's unable to go on the vacation we planned. May we have a note stating he is medically unable to attend. Also the deadline has past so in order to get the money back we need a note. If you can email it to me at [email or web address].   please and thank you. My phone number is [phone] if you need to speak with me."

## 2019-11-29 NOTE — Telephone Encounter (Signed)
I spoke with pts daughter and advised pt is in the middle of tx and it is not recommended for pt to leave the country in the event he has any reaction from his tx. It is recommended pt be near a hospital and/or tx facility that can take care of him should such and event occur. [name] expressed understanding of this information and advised the letter is no longer needed.

## 2019-12-02 ENCOUNTER — Telehealth: Payer: Self-pay | Admitting: Emergency Medicine

## 2019-12-02 ENCOUNTER — Encounter: Payer: Self-pay | Admitting: Family Medicine

## 2019-12-03 ENCOUNTER — Other Ambulatory Visit: Payer: Self-pay

## 2019-12-03 DIAGNOSIS — C3432 Malignant neoplasm of lower lobe, left bronchus or lung: Secondary | ICD-10-CM

## 2019-12-03 MED ORDER — ANORO ELLIPTA 62.5-25 MCG/INH IN AEPB: 1.0000 | INHALATION_SPRAY | Freq: Every day | RESPIRATORY_TRACT | 11 refills | Status: DC

## 2019-12-03 MED ORDER — GABAPENTIN 100 MG PO CAPS: 100.0000 mg | ORAL_CAPSULE | Freq: Three times a day (TID) | ORAL | 1 refills | Status: DC

## 2019-12-03 NOTE — Telephone Encounter (Signed)
Called and spoke with patient and wife who are requesting for RX to be sent in for Anoro. Patient verified pharmacy. Asked if they needed anything else and they stated no. RX sent in. Nothing further needed at this time.

## 2019-12-03 NOTE — Telephone Encounter (Signed)
Please see previous encounter as there is 2 open. Will close this encounter.

## 2019-12-05 ENCOUNTER — Other Ambulatory Visit: Payer: Self-pay | Admitting: Emergency Medicine

## 2019-12-05 DIAGNOSIS — C3432 Malignant neoplasm of lower lobe, left bronchus or lung: Secondary | ICD-10-CM

## 2019-12-06 ENCOUNTER — Telehealth: Payer: Self-pay | Admitting: Emergency Medicine

## 2019-12-06 ENCOUNTER — Other Ambulatory Visit: Payer: Self-pay

## 2019-12-06 ENCOUNTER — Encounter: Payer: Self-pay | Admitting: Internal Medicine

## 2019-12-06 ENCOUNTER — Ambulatory Visit: Payer: Medicare HMO | Admitting: Adult Health

## 2019-12-06 DIAGNOSIS — C3432 Malignant neoplasm of lower lobe, left bronchus or lung: Secondary | ICD-10-CM

## 2019-12-06 NOTE — Telephone Encounter (Signed)
Called and spoke with patient's wife Shaun Kennedy. She stated that the patient told her that all of his appts were cancelled today. I advised her that he was still scheduled for a PFT today at 1pm but the OV with TP at 2 had been cancelled. She verbalized understanding and stated that he will be here for the PFT at 1pm since they do not live far from here.   Nothing further needed at time of call.

## 2019-12-10 NOTE — Progress Notes (Signed)
[place] OFFICE PROGRESS NOTE  Shaun Kennedy, [address] [place] [address]  DIAGNOSIS: Stage IV (T3, N2, M1 a) non-small cell Kennedy cancer, squamous cell carcinoma presented with obstructive left lower lobe Kennedy mass in addition to mediastinal lymphadenopathy as well as bilateral pulmonary nodules diagnosed in July 2021.  PDL1:0%  PRIOR THERAPY: 1) Palliative radiotherapy to the obstructive left lower lobe Kennedy mass under the care of Dr. Lisbeth Renshaw. Completed on 09/20/19  CURRENT THERAPY: Systemic chemotherapy 2 cycles of chemotherapy with carboplatin for an AUC of5and paclitaxel175 mg/m2in addition to immunotherapy with nivolumab360 mgevery 3 weeks and ipilimumab1 mg/kgIV every 6 weeksfollowed by maintenance nivolumab and ipilimumab.He started the first treatment on 09/04/2019.  He is status post Day 1 cycle #3 of the treatment.  INTERVAL HISTORY: Shaun Kennedy 79 y.o. male returns to the clinic today for a follow-up visit.  The patient is feeling well today without any concerning complaints. He sometimes has stridor and gets short of breath with talking. Therefore, he is scheduled for a bronchoscopy under the care of Dr. Lamonte Sakai later today.  The patient recently had PFTs performed.  Otherwise, the patient denies any recent fever, chills, night sweats, or recent weight loss.  The patient is followed by member the nutritionist team.  The patient denies any nausea, vomiting, or diarrhea. He has occasional constipation and takes colace twice a day.  The patient states he rarely coughs anymore. He denies any chest pain or hemoptysis.  He denies any headache or visual changes.  He denies any rashes or skin changes.  The patient recently had a restaging CT scan performed.  He is here today for evaluation and to review his scan results before starting day 22 cycle #3.  MEDICAL HISTORY: Past Medical History:  Diagnosis Date  . Allergic rhinitis   . Asthma   .  Carotid stenosis   . Colon cancer ([place]) 2003  . Diabetes mellitus ([facility])   . Diverticulosis   . Dyslipidemia   . ED (erectile dysfunction)   . GERD (gastroesophageal reflux disease)   . H/O degenerative disc disease   . Hemorrhoids   . HTN (hypertension)   . Hyperlipidemia   . Kennedy cancer ([place])   . LVH (left ventricular hypertrophy)    on EKG  . Mass of lower lobe of left Kennedy   . Mediastinal adenopathy   . Smoker    former  . Wears dentures   . Wears dentures   . Wears glasses   . Wears glasses     ALLERGIES:  has No Known Allergies.  MEDICATIONS:  Current Outpatient Medications  Medication Sig Dispense Refill  . amLODipine (NORVASC) 5 MG tablet TAKE 1 TABLET BY MOUTH EVERY DAY (Patient taking differently: Take 5 mg by mouth daily. ) 90 tablet 3  . aspirin EC 81 MG tablet Take 81 mg by mouth daily after breakfast.     . Blood Glucose Monitoring Suppl (ONETOUCH VERIO) w/Device KIT USE TO CHECK SUGAR DAILY 1 kit 0  . Blood Glucose Monitoring Suppl (ONETOUCH VERIO) w/Device KIT USE TO CHECK SUGAR DAILY 1 kit 0  . cholecalciferol (VITAMIN D) 1000 units tablet Take 1,000 Units by mouth daily after breakfast.     . cilostazol (PLETAL) 100 MG tablet Take 100 mg by mouth 2 (two) times daily.    Marland Kitchen docusate sodium (COLACE) 100 MG capsule Take 1 capsule (100 mg total) by mouth 2 (two) times daily as needed for mild constipation. (  Patient not taking: Reported on 12/16/2019) 15 capsule 0  . dronabinol (MARINOL) 2.5 MG capsule TAKE 1 CAPSULE (2.5 MG TOTAL) BY MOUTH 2 (TWO) TIMES DAILY BEFORE A MEAL. (Patient taking differently: Take 2.5 mg by mouth 2 (two) times daily before a meal. ) 30 capsule 1  . ferrous sulfate 325 (65 FE) MG EC tablet Take 1 tablet (325 mg total) by mouth daily with breakfast. 30 tablet 12  . gabapentin (NEURONTIN) 100 MG capsule Take 1 capsule (100 mg total) by mouth 3 (three) times daily. 90 capsule 1  . guaiFENesin-codeine 100-10 MG/5ML syrup TAKE 5ML BY MOUTH 3  TIMES A DAY AS NEEDED FOR COUGH (Patient taking differently: Take 5 mLs by mouth 3 (three) times daily as needed for cough. ) 240 mL 0  . Lancets (ONETOUCH DELICA PLUS [id number]) MISC PATIENT IS TO TEST TWO TIMES A DAY DX: E11.9 (ONETOUCH VERIO FLEX) 100 each 12  . lidocaine-prilocaine (EMLA) cream Apply to the Port-A-Cath site 30-60-minute before chemotherapy. (Patient taking differently: Apply 1 application topically daily as needed (port access). Apply to the Port-A-Cath site 30-60-minute before chemotherapy.) 30 g 0  . LINZESS 72 MCG capsule Take 72 mcg by mouth every morning.    Marland Kitchen lisinopril-hydrochlorothiazide (ZESTORETIC) 20-12.5 MG tablet TAKE 1 TABLET BY MOUTH EVERY DAY (Patient taking differently: Take 1 tablet by mouth daily. ) 90 tablet 2  . metFORMIN (GLUCOPHAGE-XR) 500 MG 24 hr tablet TAKE 1 TABLET BY MOUTH EVERY DAY (Patient taking differently: Take 500 mg by mouth daily after breakfast. ) 90 tablet 1  . mirabegron ER (MYRBETRIQ) 50 MG TB24 tablet Take 1 tablet (50 mg total) by mouth daily. (Patient not taking: Reported on 12/16/2019) 14 tablet 0  . mirtazapine (REMERON) 15 MG tablet TAKE 1 TABLET BY MOUTH EVERYDAY AT BEDTIME (Patient taking differently: Take 15 mg by mouth at bedtime. ) 90 tablet 1  . Multiple Vitamin (MULTIVITAMIN WITH MINERALS) TABS tablet Take 1 tablet by mouth daily after breakfast.     . ONETOUCH VERIO test strip USE AS INSTRUCTED TO CHECK TWICE DAILY 200 strip 5  . prochlorperazine (COMPAZINE) 10 MG tablet Take 1 tablet (10 mg total) by mouth every 6 (six) hours as needed for nausea or vomiting. (Patient not taking: Reported on 12/16/2019) 30 tablet 0  . simvastatin (ZOCOR) 40 MG tablet Take 1 tablet (40 mg total) by mouth daily. (Patient taking differently: Take 40 mg by mouth every evening. ) 90 tablet 3  . sucralfate (CARAFATE) 1 GM/10ML suspension TAKE 10 MLS BY MOUTH 4 TIMES DAILY - WITH MEALS AND AT BEDTIME. (Patient taking differently: Take 1 g by mouth 4  (four) times daily -  with meals and at bedtime. ) 420 mL 0  . umeclidinium-vilanterol (ANORO ELLIPTA) 62.5-25 MCG/INH AEPB Inhale 1 puff into the lungs daily. (Patient not taking: Reported on 12/16/2019) 60 each 11   No current facility-administered medications for this visit.   Facility-Administered Medications Ordered in Other Visits  Medication Dose Route Frequency Provider Last Rate Last Admin  . sodium chloride flush (NS) 0.9 % injection 10 mL  10 mL Intracatheter PRN Curt Bears, MD   10 mL at 09/04/19 1556    SURGICAL HISTORY:  Past Surgical History:  Procedure Laterality Date  . BRONCHIAL BIOPSY  08/20/2019   Procedure: BRONCHIAL BIOPSIES;  Surgeon: Collene Gobble, MD;  Location: [facility] ENDOSCOPY;  Service: Pulmonary;;  . BRONCHIAL BRUSHINGS  08/20/2019   Procedure: BRONCHIAL BRUSHINGS;  Surgeon: Collene Gobble, MD;  Location: [facility] ENDOSCOPY;  Service: Pulmonary;;  . BRONCHIAL NEEDLE ASPIRATION BIOPSY  08/20/2019   Procedure: BRONCHIAL NEEDLE ASPIRATION BIOPSIES;  Surgeon: Collene Gobble, MD;  Location: [facility] ENDOSCOPY;  Service: Pulmonary;;  . CARPAL TUNNEL RELEASE Right 01/09/2019   Procedure: CARPAL TUNNEL RELEASE;  Surgeon: Leandrew Koyanagi, MD;  Location: [facility];  Service: Orthopedics;  Laterality: Right;  . CATARACT EXTRACTION Right 2018  . COLONOSCOPY  2007   Dr. Benson Norway  . I & D EXTREMITY Right 04/02/2016   Procedure: IRRIGATION AND DEBRIDEMENT GREAT TOE;  Surgeon: Leandrew Koyanagi, MD;  Location: [facility];  Service: Orthopedics;  Laterality: Right;  . IR IMAGING GUIDED PORT INSERTION  08/30/2019  . MULTIPLE TOOTH EXTRACTIONS    . RADIOACTIVE SEED IMPLANT  2003  . ULNAR TUNNEL RELEASE Right 01/09/2019   Procedure: RIGHT CUBITAL TUNNEL RELEASE AND CARPAL TUNNEL RELEASE;  Surgeon: Leandrew Koyanagi, MD;  Location: [facility];  Service: Orthopedics;  Laterality: Right;  . UPPER GASTROINTESTINAL ENDOSCOPY    . VIDEO BRONCHOSCOPY WITH ENDOBRONCHIAL ULTRASOUND N/A  08/20/2019   Procedure: VIDEO BRONCHOSCOPY WITH ENDOBRONCHIAL ULTRASOUND;  Surgeon: Collene Gobble, MD;  Location: [facility] ENDOSCOPY;  Service: Pulmonary;  Laterality: N/A;    REVIEW OF SYSTEMS:   Review of Systems  Constitutional: Negative for appetite change, chills, fatigue, fever and unexpected weight change.  HENT:   Negative for mouth sores, nosebleeds, sore throat and trouble swallowing.   Eyes: Negative for eye problems and icterus.  Respiratory: Positive for occasional stridor. Positive for occasional dyspnea with speaking. Negative for cough and hemoptysis. Cardiovascular: Negative for chest pain and leg swelling.  Gastrointestinal: Positive for occasional constipation. Negative for abdominal pain, diarrhea, nausea and vomiting.  Genitourinary: Negative for bladder incontinence, difficulty urinating, dysuria, frequency and hematuria.   Musculoskeletal: Negative for back pain, gait problem, neck pain and neck stiffness.  Skin: Negative for itching and rash.  Neurological: Negative for dizziness, extremity weakness, gait problem, headaches, light-headedness and seizures.  Hematological: Negative for adenopathy. Does not bruise/bleed easily.  Psychiatric/Behavioral: Negative for confusion, depression and sleep disturbance. The patient is not nervous/anxious.     PHYSICAL EXAMINATION:  Blood pressure 128/71, pulse 94, temperature 97.9 F (36.6 C), temperature source Tympanic, resp. rate 18, height 5' 7.5" (1.715 m), weight 169 lb 3.2 oz (76.7 kg), SpO2 98 %.  ECOG PERFORMANCE STATUS: 1 - Symptomatic but completely ambulatory  Physical Exam  Constitutional: Oriented to person, place, and time and well-developed, well-nourished, and in no distress.  HENT:  Head: Normocephalic and atraumatic.  Mouth/Throat: Oropharynx is clear and moist. No oropharyngeal exudate.  Eyes: Conjunctivae are normal. Right eye exhibits no discharge. Left eye exhibits no discharge. No scleral icterus.  Neck:  Normal range of motion. Neck supple.  Cardiovascular: Normal rate, regular rhythm, normal heart sounds and intact distal pulses.   Pulmonary/Chest: Effort normal and breath sounds normal. No respiratory distress. No wheezes. No rales.  Abdominal: Soft. Bowel sounds are normal. Exhibits no distension and no mass. There is no tenderness.  Musculoskeletal: Normal range of motion. Exhibits no edema.  Lymphadenopathy:    No cervical adenopathy.  Neurological: Alert and oriented to person, place, and time. Exhibits normal muscle tone. Gait normal. Coordination normal.  Skin: Skin is warm and dry. No rash noted. Not diaphoretic. No erythema. No pallor.  Psychiatric: Mood, memory and judgment normal.  Vitals reviewed.  LABORATORY DATA: Lab Results  Component Value Date   WBC 5.6 12/18/2019  HGB 9.0 (L) 12/18/2019   HCT 27.1 (L) 12/18/2019   MCV 93.4 12/18/2019   PLT 287 12/18/2019      Chemistry      Component Value Date/Time   NA 139 12/18/2019 0856   NA 132 (L) 08/14/2019 0918   K 4.0 12/18/2019 0856   CL 104 12/18/2019 0856   CO2 27 12/18/2019 0856   BUN 23 12/18/2019 0856   BUN 16 08/14/2019 0918   CREATININE 1.27 (H) 12/18/2019 0856   CREATININE 1.25 (H) 08/08/2016 0912      Component Value Date/Time   CALCIUM 9.0 12/18/2019 0856   ALKPHOS 44 12/18/2019 0856   AST 15 12/18/2019 0856   ALT 11 12/18/2019 0856   BILITOT 0.5 12/18/2019 0856       RADIOGRAPHIC STUDIES:  CT Chest W Contrast  Result Date: 12/12/2019 CLINICAL DATA:  Non-small-cell Kennedy cancer diagnosed in 2019. Ongoing chemotherapy. Radiation therapy completed in 2020. EXAM: CT CHEST, ABDOMEN, AND PELVIS WITH CONTRAST TECHNIQUE: Multidetector CT imaging of the chest, abdomen and pelvis was performed following the standard protocol during bolus administration of intravenous contrast. CONTRAST:  127m OMNIPAQUE IOHEXOL 300 MG/ML  SOLN COMPARISON:  09/13/2019 PET.  CTA chest 10/18/2019. FINDINGS: CT CHEST FINDINGS  Cardiovascular: Right Port-A-Cath tip at superior caval/atrial junction. Advanced aortic and branch vessel atherosclerosis. Mild cardiomegaly, without pericardial effusion. Multivessel coronary artery atherosclerosis. No central pulmonary embolism, on this non-dedicated study. Mediastinum/Nodes: Low right paratracheal node measures 1.0 cm on 22/2 versus 1.3 cm on the prior. Subcarinal node measures 1.8 cm on 27/2 versus 1.9 cm on the prior. No hilar adenopathy. Lungs/Pleura: Trace left pleural thickening and fluid, decreased. Moderate centrilobular emphysema. Presumed secretions in the right-side of the trachea. Bilateral pulmonary nodules. Index right middle lobe 5 mm nodule on 85/4 is similar to 10/18/2019. Index left upper lobe pulmonary nodule measures 6 mm on 25/4 versus 7 mm on the prior. Pleural-based left lower lobe 6 mm nodule on 93/4 is decreased from 14 x 11 mm on 10/18/2019. Medial left lower Kennedy predominant ground-glass and less so airspace disease is significantly progressive, likely radiation induced. Central superior segment left lower lobe centrally cavitary Kennedy mass measures 3.6 x 2.5 cm on 55/4. Compared 4.4 x 2.8 cm on 10/18/2019. Musculoskeletal: No acute osseous abnormality. CT ABDOMEN PELVIS FINDINGS Hepatobiliary: Normal liver. Normal gallbladder, without biliary ductal dilatation. Pancreas: Normal, without mass or ductal dilatation. Spleen: Normal in size, without focal abnormality. Adrenals/Urinary Tract: Normal adrenal glands. Bilateral renal vascular calcifications. Upper pole right renal sinus cyst of 1.7 cm. Normal urinary bladder. Stomach/Bowel: Proximal gastric underdistention. Colonic stool burden suggests constipation. Normal terminal ileum. Nonobstructive small bowel within a right inguinal hernia on 113/2. Vascular/Lymphatic: Advanced aortic and branch vessel atherosclerosis. No abdominopelvic adenopathy. Reproductive: Radiation seeds within the prostate. Other: No significant  free fluid. No evidence of omental or peritoneal disease. Musculoskeletal: Lumbosacral spondylosis IMPRESSION: 1. Since the CTA of the chest of 10/18/2019, response to therapy of superior segment left lower lobe cavitary Kennedy mass and thoracic nodal metastasis. 2. Similar to minimal improvement in pulmonary metastasis. 3. New or progressive presumably radiation induced ground-glass and less so airspace disease within the left Kennedy. 4. No acute process or evidence of metastatic disease in the abdomen or pelvis. 5. Aortic atherosclerosis (ICD10-I70.0), coronary artery atherosclerosis and emphysema (ICD10-J43.9). 6.  Possible constipation. 7. Nonobstructive small bowel containing right inguinal hernia. Electronically Signed   By: KAbigail MiyamotoM.D.   On: 12/12/2019 08:27   CT Abdomen  Pelvis W Contrast  Result Date: 12/12/2019 CLINICAL DATA:  Non-small-cell Kennedy cancer diagnosed in 2019. Ongoing chemotherapy. Radiation therapy completed in 2020. EXAM: CT CHEST, ABDOMEN, AND PELVIS WITH CONTRAST TECHNIQUE: Multidetector CT imaging of the chest, abdomen and pelvis was performed following the standard protocol during bolus administration of intravenous contrast. CONTRAST:  167m OMNIPAQUE IOHEXOL 300 MG/ML  SOLN COMPARISON:  09/13/2019 PET.  CTA chest 10/18/2019. FINDINGS: CT CHEST FINDINGS Cardiovascular: Right Port-A-Cath tip at superior caval/atrial junction. Advanced aortic and branch vessel atherosclerosis. Mild cardiomegaly, without pericardial effusion. Multivessel coronary artery atherosclerosis. No central pulmonary embolism, on this non-dedicated study. Mediastinum/Nodes: Low right paratracheal node measures 1.0 cm on 22/2 versus 1.3 cm on the prior. Subcarinal node measures 1.8 cm on 27/2 versus 1.9 cm on the prior. No hilar adenopathy. Lungs/Pleura: Trace left pleural thickening and fluid, decreased. Moderate centrilobular emphysema. Presumed secretions in the right-side of the trachea. Bilateral pulmonary  nodules. Index right middle lobe 5 mm nodule on 85/4 is similar to 10/18/2019. Index left upper lobe pulmonary nodule measures 6 mm on 25/4 versus 7 mm on the prior. Pleural-based left lower lobe 6 mm nodule on 93/4 is decreased from 14 x 11 mm on 10/18/2019. Medial left lower Kennedy predominant ground-glass and less so airspace disease is significantly progressive, likely radiation induced. Central superior segment left lower lobe centrally cavitary Kennedy mass measures 3.6 x 2.5 cm on 55/4. Compared 4.4 x 2.8 cm on 10/18/2019. Musculoskeletal: No acute osseous abnormality. CT ABDOMEN PELVIS FINDINGS Hepatobiliary: Normal liver. Normal gallbladder, without biliary ductal dilatation. Pancreas: Normal, without mass or ductal dilatation. Spleen: Normal in size, without focal abnormality. Adrenals/Urinary Tract: Normal adrenal glands. Bilateral renal vascular calcifications. Upper pole right renal sinus cyst of 1.7 cm. Normal urinary bladder. Stomach/Bowel: Proximal gastric underdistention. Colonic stool burden suggests constipation. Normal terminal ileum. Nonobstructive small bowel within a right inguinal hernia on 113/2. Vascular/Lymphatic: Advanced aortic and branch vessel atherosclerosis. No abdominopelvic adenopathy. Reproductive: Radiation seeds within the prostate. Other: No significant free fluid. No evidence of omental or peritoneal disease. Musculoskeletal: Lumbosacral spondylosis IMPRESSION: 1. Since the CTA of the chest of 10/18/2019, response to therapy of superior segment left lower lobe cavitary Kennedy mass and thoracic nodal metastasis. 2. Similar to minimal improvement in pulmonary metastasis. 3. New or progressive presumably radiation induced ground-glass and less so airspace disease within the left Kennedy. 4. No acute process or evidence of metastatic disease in the abdomen or pelvis. 5. Aortic atherosclerosis (ICD10-I70.0), coronary artery atherosclerosis and emphysema (ICD10-J43.9). 6.  Possible  constipation. 7. Nonobstructive small bowel containing right inguinal hernia. Electronically Signed   By: KAbigail MiyamotoM.D.   On: 12/12/2019 08:27     ASSESSMENT/PLAN:  This is a very pleasant 79year old [name] recently diagnosed with stage IV (T3, N2, M1a) non-small cell Kennedy cancer, squamous cell carcinoma.  He presented with a left lower lobe obstructive Kennedy mass in addition to mediastinal lymphadenopathy.  He also presented with bilateral pulmonary nodules.  He was diagnosed in July 2021.  His PD-L1 expression is negative.  He completed palliative radiotherapy to the obstructive Kennedy mass under the care of Dr. MLisbeth Renshawin August 2021.  The patient is currently undergoing systemic chemotherapy with carboplatin and paclitaxel for 2 cycles in addition to immunotherapy with ipilimumab 1 mg/KG every 6 weeks and nivolumab 360 mg IV every 3 weeks.  He is status post day 1 of cycle 3.   The patient recently had a restaging CT scan performed.  Dr. MJulien Nordmann  personally and independently reviewed the scan and discussed results with the patient today.  The scan showed no evidence for disease progression.  Dr. Julien Nordmann recommends that the patient continue on the same treatment at the same dose.  We will see him back for follow-up visit in 3 weeks for evaluation before starting day 1 of cycle 4.  The patient will undergo his bronchoscopy later today as scheduled under the care of Dr. Lamonte Sakai  The patient was advised to call immediately if he has any concerning symptoms in the interval. The patient voices understanding of current disease status and treatment options and is in agreement with the current care plan. All questions were answered. The patient knows to call the clinic with any problems, questions or concerns. We can certainly see the patient much sooner if necessary  No orders of the defined types were placed in this encounter.    Per Beagley L Xariah Silvernail,  PA-C 12/18/19  ADDENDUM: Hematology/Oncology Attending: I had a face-to-face encounter with the patient today.  I recommended his care plan.  This is a very pleasant 79 years old [name] with a stage IV non-small cell Kennedy cancer, squamous cell carcinoma status post palliative radiotherapy to the obstructive Kennedy mass under the care of Dr. Lisbeth Renshaw and he is currently undergoing systemic chemotherapy initially with 2 cycles of carboplatin and paclitaxel in addition to immunotherapy with ipilimumab and nivolumab followed by maintenance treatment with ipilimumab and nivolumab.  He is status post 2 cycles and he is currently undergoing cycle #3. The patient had repeat CT scan of the chest, abdomen pelvis performed recently.  I personally and independently reviewed the scan and discussed the results with the patient and his daughter who was available by phone. His scan showed no concerning findings for disease progression but there was some radiation changes. I recommended for the patient to continue his current treatment with maintenance adalimumab and nivolumab as planned. For the upper respiratory wheezing, he is scheduled to see Dr. Lamonte Sakai and had bronchoscopy to rule out any obstructive upper airway disease. The patient will come back for follow-up visit in 3 weeks for evaluation before starting cycle #4. He was advised to call immediately if he has any concerning symptoms in the interval.  Disclaimer: This note was dictated with voice recognition software. Similar sounding words can inadvertently be transcribed and may be missed upon review. Eilleen Kempf, MD 12/18/19

## 2019-12-11 ENCOUNTER — Ambulatory Visit (INDEPENDENT_AMBULATORY_CARE_PROVIDER_SITE_OTHER): Payer: Medicare HMO | Admitting: Emergency Medicine

## 2019-12-11 ENCOUNTER — Other Ambulatory Visit: Payer: Self-pay | Admitting: *Deleted

## 2019-12-11 ENCOUNTER — Encounter: Payer: Self-pay | Admitting: Emergency Medicine

## 2019-12-11 ENCOUNTER — Telehealth: Payer: Self-pay | Admitting: Emergency Medicine

## 2019-12-11 ENCOUNTER — Other Ambulatory Visit: Payer: Self-pay

## 2019-12-11 VITALS — BP 114/74 | HR 114 | Ht 67.5 in | Wt 174.0 lb

## 2019-12-11 DIAGNOSIS — R0602 Shortness of breath: Secondary | ICD-10-CM

## 2019-12-11 DIAGNOSIS — R061 Stridor: Secondary | ICD-10-CM

## 2019-12-11 DIAGNOSIS — Z5181 Encounter for therapeutic drug level monitoring: Secondary | ICD-10-CM

## 2019-12-11 LAB — PULMONARY FUNCTION TEST
DL/VA % pred: 152 %
DL/VA: 5.96 ml/min/mmHg/L
DLCO cor % pred: 115 %
DLCO cor: 25.93 ml/min/mmHg
DLCO unc % pred: 96 %
DLCO unc: 21.69 ml/min/mmHg
FEF 25-75 Post: 0.84 L/sec
FEF 25-75 Pre: 1.6 L/sec
FEF2575-%Change-Post: -47 %
FEF2575-%Pred-Post: 46 %
FEF2575-%Pred-Pre: 89 %
FEV1-%Change-Post: -5 %
FEV1-%Pred-Post: 85 %
FEV1-%Pred-Pre: 89 %
FEV1-Post: 2 L
FEV1-Pre: 2.11 L
FEV1FVC-%Change-Post: 12 %
FEV1FVC-%Pred-Pre: 90 %
FEV6-%Change-Post: -16 %
FEV6-%Pred-Post: 85 %
FEV6-%Pred-Pre: 102 %
FEV6-Post: 2.62 L
FEV6-Pre: 3.13 L
FEV6FVC-%Pred-Post: 106 %
FEV6FVC-%Pred-Pre: 106 %
FVC-%Change-Post: -16 %
FVC-%Pred-Post: 80 %
FVC-%Pred-Pre: 95 %
FVC-Post: 2.62 L
FVC-Pre: 3.13 L
Post FEV1/FVC ratio: 76 %
Post FEV6/FVC ratio: 100 %
Pre FEV1/FVC ratio: 68 %
Pre FEV6/FVC Ratio: 100 %

## 2019-12-11 NOTE — Progress Notes (Signed)
Subjective:    Patient ID: Shaun Kennedy, male    DOB: Jun 25, 1940, 79 y.o.   MRN: [id number]  HPI 79 year old former smoker (25 pack years) with a history of allergic rhinitis, asthma, prostate cancer, diabetes.   He was seen in the emergency department on 08/16/2019 for shortness of breath and cough.  The cough started about 2-3 weeks ago. No hemoptysis. He has lost 80 lbs over the last year, unexplained. He is off his Pletal, last took it 2 weeks ago.   A CT scan of his chest and abdomen were performed on 7/9 which I have reviewed, shows a 6.7 x 4.8 cm heterogeneous left lower lobe medial mass with numerous bilateral noncalcified nodules.  Also mediastinal, subcarinal lymphadenopathy.  ROV 12/11/19 --follow-up visit, former smoker, with a history of prostate cancer, diabetes, asthma and newly diagnosed squamous cell lung cancer (08/2019) stage IV.  He was treated with palliative XRT to the left lower lobe mass, systemic chemotherapy with immunotherapy (nivolumab and ipilimumab).  He was seen in the emergency department 10/18/2019 for dyspnea and underwent his most recent CT chest 10/18/2019 that showed no PE, improved mediastinal adenopathy, improved multifocal pulmonary nodular disease and a decrease in size of his primary left lower lobe mass.  There was a larger left pleural based nodule.  Small left effusion.  He was treated in our office with a prednisone taper 11/06/2019. Was started on Stiolto, converted over to Anoro, doesn't believe it is helping him much. He reports insp stridor, believes it is new since the bronchoscopy. Doesn't believe that the prednisone helped him.  Difficult to get a deep breath in - feels it more on the L.    Pulmonary function testing done today reviewed by me, show mild obstruction without a bronchodilator response, restricted lung volumes and a normal diffusion capacity.  His flow volume loop of his inspiratory curve, to a lesser degree his expiratory curve is  well   Review of Systems As per HPI      Objective:   Physical Exam  Vitals:   12/11/19 1113  BP: 114/74  Pulse: (!) 114  SpO2: 98%  Weight: 174 lb (78.9 kg)  Height: 5' 7.5" (1.715 m)   Gen: Pleasant, well-nourished, in no distress,  normal affect  ENT: No lesions,  mouth clear,  oropharynx clear, no postnasal drip  Neck: No JVD, significant biphasic stridor with normal breaths  Lungs: No use of accessory muscles, referred upper airway noise and stridor, decreased on the L  Cardiovascular: RRR, heart sounds normal, no murmur or gallops, no peripheral edema  Musculoskeletal: No deformities, no cyanosis or clubbing  Neuro: alert, awake, non focal  Skin: Warm, no lesions or rash      Assessment & Plan:  Shortness of breath Significant dyspnea, certainly out of portion to his airflows on PFT today.  He did get a clinical benefit from Anoro.  I do not see much evidence for COPD here.  He does have stridor and flow volume loop findings consistent with a possible variable or fixed upper airway obstruction.  This could relate to intubation for his original bronchoscopy in July as neither he nor his wife recall any stridor preceding that procedure.  I think he needs an airway inspection and I will arrange for bronchoscopy.  In the meantime we can stop the Anoro.  I do not see any evidence for radiation pneumonitis on his most recent imaging.  He is going to have a repeat CT soon  and we can do surveillance for this.  He did not respond to prednisone which is not surprising given the reassuring CT.  Please stop Anoro  We will work on arranging for bronchoscopy to further evaluate your upper airway. Get your CT scan and follow with Dr. Julien Nordmann as planned Follow with Dr Lamonte Sakai in 1 month  Baltazar Apo, MD, PhD 12/11/2019, 11:38 AM [facility] [phone] or if no answer [phone]

## 2019-12-11 NOTE — H&P (View-Only) (Signed)
Subjective:    Patient ID: Shaun Kennedy, male    DOB: 06/16/1940, 79 y.o.   MRN: [id number]  HPI 79 year old former smoker (25 pack years) with a history of allergic rhinitis, asthma, prostate cancer, diabetes.   He was seen in the emergency department on 08/16/2019 for shortness of breath and cough.  The cough started about 2-3 weeks ago. No hemoptysis. He has lost 80 lbs over the last year, unexplained. He is off his Pletal, last took it 2 weeks ago.   A CT scan of his chest and abdomen were performed on 7/9 which I have reviewed, shows a 6.7 x 4.8 cm heterogeneous left lower lobe medial mass with numerous bilateral noncalcified nodules.  Also mediastinal, subcarinal lymphadenopathy.  ROV 12/11/19 --follow-up visit, former smoker, with a history of prostate cancer, diabetes, asthma and newly diagnosed squamous cell lung cancer (08/2019) stage IV.  He was treated with palliative XRT to the left lower lobe mass, systemic chemotherapy with immunotherapy (nivolumab and ipilimumab).  He was seen in the emergency department 10/18/2019 for dyspnea and underwent his most recent CT chest 10/18/2019 that showed no PE, improved mediastinal adenopathy, improved multifocal pulmonary nodular disease and a decrease in size of his primary left lower lobe mass.  There was a larger left pleural based nodule.  Small left effusion.  He was treated in our office with a prednisone taper 11/06/2019. Was started on Stiolto, converted over to Anoro, doesn't believe it is helping him much. He reports insp stridor, believes it is new since the bronchoscopy. Doesn't believe that the prednisone helped him.  Difficult to get a deep breath in - feels it more on the L.    Pulmonary function testing done today reviewed by me, show mild obstruction without a bronchodilator response, restricted lung volumes and a normal diffusion capacity.  His flow volume loop of his inspiratory curve, to a lesser degree his expiratory curve is  well   Review of Systems As per HPI      Objective:   Physical Exam  Vitals:   12/11/19 1113  BP: 114/74  Pulse: (!) 114  SpO2: 98%  Weight: 174 lb (78.9 kg)  Height: 5' 7.5" (1.715 m)   Gen: Pleasant, well-nourished, in no distress,  normal affect  ENT: No lesions,  mouth clear,  oropharynx clear, no postnasal drip  Neck: No JVD, significant biphasic stridor with normal breaths  Lungs: No use of accessory muscles, referred upper airway noise and stridor, decreased on the L  Cardiovascular: RRR, heart sounds normal, no murmur or gallops, no peripheral edema  Musculoskeletal: No deformities, no cyanosis or clubbing  Neuro: alert, awake, non focal  Skin: Warm, no lesions or rash      Assessment & Plan:  Shortness of breath Significant dyspnea, certainly out of portion to his airflows on PFT today.  He did get a clinical benefit from Anoro.  I do not see much evidence for COPD here.  He does have stridor and flow volume loop findings consistent with a possible variable or fixed upper airway obstruction.  This could relate to intubation for his original bronchoscopy in July as neither he nor his wife recall any stridor preceding that procedure.  I think he needs an airway inspection and I will arrange for bronchoscopy.  In the meantime we can stop the Anoro.  I do not see any evidence for radiation pneumonitis on his most recent imaging.  He is going to have a repeat CT soon  and we can do surveillance for this.  He did not respond to prednisone which is not surprising given the reassuring CT.  Please stop Anoro  We will work on arranging for bronchoscopy to further evaluate your upper airway. Get your CT scan and follow with Dr. Julien Nordmann as planned Follow with Dr Lamonte Sakai in 1 month  Baltazar Apo, MD, PhD 12/11/2019, 11:38 AM  [facility] [phone] or if no answer [phone]

## 2019-12-11 NOTE — Progress Notes (Signed)
Full PFT performed today. °

## 2019-12-11 NOTE — Assessment & Plan Note (Signed)
Significant dyspnea, certainly out of portion to his airflows on PFT today.  He did get a clinical benefit from Anoro.  I do not see much evidence for COPD here.  He does have stridor and flow volume loop findings consistent with a possible variable or fixed upper airway obstruction.  This could relate to intubation for his original bronchoscopy in July as neither he nor his wife recall any stridor preceding that procedure.  I think he needs an airway inspection and I will arrange for bronchoscopy.  In the meantime we can stop the Anoro.  I do not see any evidence for radiation pneumonitis on his most recent imaging.  He is going to have a repeat CT soon and we can do surveillance for this.  He did not respond to prednisone which is not surprising given the reassuring CT.  Please stop Anoro  We will work on arranging for bronchoscopy to further evaluate your upper airway. Get your CT scan and follow with Dr. Julien Nordmann as planned Follow with Dr Lamonte Sakai in 1 month

## 2019-12-11 NOTE — Patient Instructions (Signed)
Please stop Anoro  We will work on arranging for bronchoscopy to further evaluate your upper airway. Get your CT scan and follow with Dr. Julien Nordmann as planned Follow with Dr Lamonte Sakai in 1 month

## 2019-12-11 NOTE — Telephone Encounter (Signed)
Dr Lamonte Sakai put in order for regular bronch that hit our workque -      Diagnosis: Stridor Department: [facility]  Sched Instruct:   Comment: Please schedule the following: Diagnosis: stridor, possible tracheal stenosis Procedure: standard bronchoscopy  Anesthesia: conscious sedation  Do you need Fluro? no Priority: non-urgent Date: 11/8 or 11/10 Time: AM or PM Location: [facility] Does patient have OSA? no DM? yes Or Latex allergy? no Medication Restriction: none Anticoagulate/Antiplatelet: non Imaging request: none  Please coordinate Pre-op COVID Testing

## 2019-12-12 ENCOUNTER — Other Ambulatory Visit: Payer: Self-pay | Admitting: Gastroenterology

## 2019-12-12 ENCOUNTER — Other Ambulatory Visit: Payer: Self-pay | Admitting: Family Medicine

## 2019-12-12 ENCOUNTER — Ambulatory Visit (HOSPITAL_COMMUNITY)
Admission: RE | Admit: 2019-12-12 | Discharge: 2019-12-12 | Disposition: A | Discharge status: Home / Self Care | Payer: Medicare HMO | Source: Ambulatory Visit | Attending: Internal Medicine | Admitting: Internal Medicine

## 2019-12-12 DIAGNOSIS — E1159 Type 2 diabetes mellitus with other circulatory complications: Secondary | ICD-10-CM

## 2019-12-12 DIAGNOSIS — I152 Hypertension secondary to endocrine disorders: Secondary | ICD-10-CM

## 2019-12-12 DIAGNOSIS — J984 Other disorders of lung: Secondary | ICD-10-CM | POA: Diagnosis not present

## 2019-12-12 DIAGNOSIS — N281 Cyst of kidney, acquired: Secondary | ICD-10-CM | POA: Diagnosis not present

## 2019-12-12 DIAGNOSIS — C3490 Malignant neoplasm of unspecified part of unspecified bronchus or lung: Secondary | ICD-10-CM | POA: Diagnosis not present

## 2019-12-12 DIAGNOSIS — Z5111 Encounter for antineoplastic chemotherapy: Secondary | ICD-10-CM | POA: Diagnosis not present

## 2019-12-12 DIAGNOSIS — N2889 Other specified disorders of kidney and ureter: Secondary | ICD-10-CM | POA: Diagnosis not present

## 2019-12-12 DIAGNOSIS — I2510 Atherosclerotic heart disease of native coronary artery without angina pectoris: Secondary | ICD-10-CM | POA: Diagnosis not present

## 2019-12-12 DIAGNOSIS — K4090 Unilateral inguinal hernia, without obstruction or gangrene, not specified as recurrent: Secondary | ICD-10-CM | POA: Diagnosis not present

## 2019-12-12 MED ORDER — IOHEXOL 300 MG/ML  SOLN
100.0000 mL | Freq: Once | INTRAMUSCULAR | Status: AC | PRN
  Administered 2019-12-12: 100 mL via INTRAVENOUS

## 2019-12-13 ENCOUNTER — Other Ambulatory Visit: Payer: Self-pay

## 2019-12-13 ENCOUNTER — Encounter (HOSPITAL_COMMUNITY): Payer: Self-pay | Admitting: Emergency Medicine

## 2019-12-13 NOTE — Telephone Encounter (Signed)
Called and spoke with patient. Let them know their Bronch is scheduled for 12/18/19 with Dr. Lamonte Sakai at [facility] at 1:30pm.  Patient was instructed to arrive at hospital at 12pm. They were instructed to bring someone with them as they will not be able to drive home from procedure. Patient instructed not to have anything to eat or drink after midnight.   Patient's covid screening is scheduled at [facility] for 12/14/19 at 0945.  Patient voiced understanding, nothing further needed  Routing to [facility] as FYI

## 2019-12-14 ENCOUNTER — Other Ambulatory Visit (HOSPITAL_COMMUNITY)
Admission: RE | Admit: 2019-12-14 | Discharge: 2019-12-14 | Disposition: A | Discharge status: Home / Self Care | Payer: Medicare HMO | Source: Ambulatory Visit | Attending: Emergency Medicine | Admitting: Emergency Medicine

## 2019-12-14 DIAGNOSIS — Z01812 Encounter for preprocedural laboratory examination: Secondary | ICD-10-CM | POA: Insufficient documentation

## 2019-12-14 DIAGNOSIS — Z20822 Contact with and (suspected) exposure to covid-19: Secondary | ICD-10-CM | POA: Diagnosis not present

## 2019-12-14 LAB — SARS CORONAVIRUS 2 (TAT 6-24 HRS): SARS Coronavirus 2: NEGATIVE

## 2019-12-17 ENCOUNTER — Ambulatory Visit (HOSPITAL_COMMUNITY): Payer: Medicare HMO

## 2019-12-17 ENCOUNTER — Telehealth: Payer: Self-pay | Admitting: Emergency Medicine

## 2019-12-17 NOTE — Telephone Encounter (Signed)
Spoke with pt's wife and answered questions about where/when to arrive for bronch and advised pt to be NPO after midnight tonight.  Wife expressed understanding.  Nothing further needed at this time- will close encounter.

## 2019-12-18 ENCOUNTER — Inpatient Hospital Stay: Payer: Medicare HMO | Attending: Internal Medicine

## 2019-12-18 ENCOUNTER — Ambulatory Visit (HOSPITAL_COMMUNITY)
Admission: AD | Admit: 2019-12-18 | Discharge: 2019-12-18 | Disposition: A | Discharge status: Home / Self Care | Payer: Medicare HMO | Attending: Emergency Medicine | Admitting: Emergency Medicine

## 2019-12-18 ENCOUNTER — Encounter (HOSPITAL_COMMUNITY): Payer: Self-pay | Admitting: Emergency Medicine

## 2019-12-18 ENCOUNTER — Inpatient Hospital Stay: Payer: Medicare HMO

## 2019-12-18 ENCOUNTER — Inpatient Hospital Stay: Payer: Medicare HMO | Admitting: Physician Assistant

## 2019-12-18 ENCOUNTER — Encounter (HOSPITAL_COMMUNITY)
Admission: AD | Disposition: A | Discharge status: Home / Self Care | Payer: Self-pay | Source: Home / Self Care | Attending: Emergency Medicine

## 2019-12-18 ENCOUNTER — Other Ambulatory Visit: Payer: Self-pay

## 2019-12-18 VITALS — BP 128/71 | HR 94 | Temp 97.9°F | Resp 18 | Ht 67.5 in | Wt 169.2 lb

## 2019-12-18 DIAGNOSIS — Z7984 Long term (current) use of oral hypoglycemic drugs: Secondary | ICD-10-CM | POA: Diagnosis not present

## 2019-12-18 DIAGNOSIS — J439 Emphysema, unspecified: Secondary | ICD-10-CM | POA: Diagnosis not present

## 2019-12-18 DIAGNOSIS — Z5112 Encounter for antineoplastic immunotherapy: Secondary | ICD-10-CM | POA: Diagnosis not present

## 2019-12-18 DIAGNOSIS — Z87891 Personal history of nicotine dependence: Secondary | ICD-10-CM | POA: Insufficient documentation

## 2019-12-18 DIAGNOSIS — J45909 Unspecified asthma, uncomplicated: Secondary | ICD-10-CM | POA: Diagnosis not present

## 2019-12-18 DIAGNOSIS — K5900 Constipation, unspecified: Secondary | ICD-10-CM | POA: Diagnosis not present

## 2019-12-18 DIAGNOSIS — Z85038 Personal history of other malignant neoplasm of large intestine: Secondary | ICD-10-CM | POA: Insufficient documentation

## 2019-12-18 DIAGNOSIS — C3432 Malignant neoplasm of lower lobe, left bronchus or lung: Secondary | ICD-10-CM | POA: Insufficient documentation

## 2019-12-18 DIAGNOSIS — Z923 Personal history of irradiation: Secondary | ICD-10-CM | POA: Insufficient documentation

## 2019-12-18 DIAGNOSIS — E119 Type 2 diabetes mellitus without complications: Secondary | ICD-10-CM | POA: Insufficient documentation

## 2019-12-18 DIAGNOSIS — J984 Other disorders of lung: Secondary | ICD-10-CM | POA: Insufficient documentation

## 2019-12-18 DIAGNOSIS — Z95828 Presence of other vascular implants and grafts: Secondary | ICD-10-CM

## 2019-12-18 DIAGNOSIS — Z79899 Other long term (current) drug therapy: Secondary | ICD-10-CM | POA: Diagnosis not present

## 2019-12-18 DIAGNOSIS — J386 Stenosis of larynx: Secondary | ICD-10-CM | POA: Diagnosis not present

## 2019-12-18 DIAGNOSIS — I2510 Atherosclerotic heart disease of native coronary artery without angina pectoris: Secondary | ICD-10-CM | POA: Insufficient documentation

## 2019-12-18 DIAGNOSIS — I10 Essential (primary) hypertension: Secondary | ICD-10-CM | POA: Insufficient documentation

## 2019-12-18 DIAGNOSIS — R061 Stridor: Secondary | ICD-10-CM | POA: Diagnosis present

## 2019-12-18 DIAGNOSIS — E785 Hyperlipidemia, unspecified: Secondary | ICD-10-CM | POA: Diagnosis not present

## 2019-12-18 DIAGNOSIS — J383 Other diseases of vocal cords: Secondary | ICD-10-CM | POA: Insufficient documentation

## 2019-12-18 DIAGNOSIS — Z7982 Long term (current) use of aspirin: Secondary | ICD-10-CM | POA: Diagnosis not present

## 2019-12-18 DIAGNOSIS — K219 Gastro-esophageal reflux disease without esophagitis: Secondary | ICD-10-CM | POA: Diagnosis not present

## 2019-12-18 HISTORY — DX: Malignant neoplasm of unspecified part of unspecified bronchus or lung: C34.90

## 2019-12-18 HISTORY — PX: VIDEO BRONCHOSCOPY: SHX5072

## 2019-12-18 LAB — CMP (CANCER CENTER ONLY)
ALT: 11 U/L (ref 0–44)
AST: 15 U/L (ref 15–41)
Albumin: 2.8 g/dL — ABNORMAL LOW (ref 3.5–5.0)
Alkaline Phosphatase: 44 U/L (ref 38–126)
Anion gap: 8 (ref 5–15)
BUN: 23 mg/dL (ref 8–23)
CO2: 27 mmol/L (ref 22–32)
Calcium: 9 mg/dL (ref 8.9–10.3)
Chloride: 104 mmol/L (ref 98–111)
Creatinine: 1.27 mg/dL — ABNORMAL HIGH (ref 0.61–1.24)
GFR, Estimated: 57 mL/min — ABNORMAL LOW (ref >60)
Glucose, Bld: 89 mg/dL (ref 70–99)
Potassium: 4 mmol/L (ref 3.5–5.1)
Sodium: 139 mmol/L (ref 135–145)
Total Bilirubin: 0.5 mg/dL (ref 0.3–1.2)
Total Protein: 7.2 g/dL (ref 6.5–8.1)

## 2019-12-18 LAB — TSH: TSH: 1.18 u[IU]/mL (ref 0.320–4.118)

## 2019-12-18 LAB — CBC WITH DIFFERENTIAL (CANCER CENTER ONLY)
Abs Immature Granulocytes: 0.02 10*3/uL (ref 0.00–0.07)
Basophils Absolute: 0 10*3/uL (ref 0.0–0.1)
Basophils Relative: 0 %
Eosinophils Absolute: 0.2 10*3/uL (ref 0.0–0.5)
Eosinophils Relative: 3 %
HCT: 27.1 % — ABNORMAL LOW (ref 39.0–52.0)
Hemoglobin: 9 g/dL — ABNORMAL LOW (ref 13.0–17.0)
Immature Granulocytes: 0 %
Lymphocytes Relative: 15 %
Lymphs Abs: 0.9 10*3/uL (ref 0.7–4.0)
MCH: 31 pg (ref 26.0–34.0)
MCHC: 33.2 g/dL (ref 30.0–36.0)
MCV: 93.4 fL (ref 80.0–100.0)
Monocytes Absolute: 0.8 10*3/uL (ref 0.1–1.0)
Monocytes Relative: 13 %
Neutro Abs: 3.8 10*3/uL (ref 1.7–7.7)
Neutrophils Relative %: 69 %
Platelet Count: 287 10*3/uL (ref 150–400)
RBC: 2.9 MIL/uL — ABNORMAL LOW (ref 4.22–5.81)
RDW: 12.5 % (ref 11.5–15.5)
WBC Count: 5.6 10*3/uL (ref 4.0–10.5)
nRBC: 0 % (ref 0.0–0.2)

## 2019-12-18 LAB — GLUCOSE, CAPILLARY: Glucose-Capillary: 77 mg/dL (ref 70–99)

## 2019-12-18 SURGERY — VIDEO BRONCHOSCOPY WITHOUT FLUORO
Anesthesia: Moderate Sedation

## 2019-12-18 MED ORDER — LIDOCAINE HCL 1 % IJ SOLN
6.0000 mL | Freq: Once | INTRAMUSCULAR | Status: AC
  Administered 2019-12-18: 6 mL

## 2019-12-18 MED ORDER — SODIUM CHLORIDE 0.9 % IV SOLN: Freq: Once | INTRAVENOUS | Status: AC

## 2019-12-18 MED ORDER — PHENYLEPHRINE HCL 0.25 % NA SOLN: 1.0000 | Freq: Four times a day (QID) | NASAL | Status: DC | PRN

## 2019-12-18 MED ORDER — SODIUM CHLORIDE 0.9 % IV SOLN
Freq: Once | INTRAVENOUS | Status: AC
  Filled 2019-12-18: qty 250

## 2019-12-18 MED ORDER — DIPHENHYDRAMINE HCL 50 MG/ML IJ SOLN
INTRAMUSCULAR | Status: AC
  Filled 2019-12-18: qty 1

## 2019-12-18 MED ORDER — MIDAZOLAM HCL (PF) 5 MG/ML IJ SOLN
INTRAMUSCULAR | Status: AC
  Filled 2019-12-18: qty 2

## 2019-12-18 MED ORDER — SODIUM CHLORIDE 0.9% FLUSH
10.0000 mL | Freq: Once | INTRAVENOUS | Status: AC
  Administered 2019-12-18: 10 mL
  Filled 2019-12-18: qty 10

## 2019-12-18 MED ORDER — SODIUM CHLORIDE 0.9 % IV SOLN
360.0000 mg | Freq: Once | INTRAVENOUS | Status: AC
  Administered 2019-12-18: 360 mg via INTRAVENOUS
  Filled 2019-12-18: qty 24

## 2019-12-18 MED ORDER — FENTANYL CITRATE (PF) 100 MCG/2ML IJ SOLN
INTRAMUSCULAR | Status: DC | PRN
Start: 2019-12-18 — End: 2019-12-18
  Administered 2019-12-18: 50 ug via INTRAVENOUS
  Administered 2019-12-18 (×2): 25 ug via INTRAVENOUS

## 2019-12-18 MED ORDER — HEPARIN SOD (PORK) LOCK FLUSH 100 UNIT/ML IV SOLN
500.0000 [IU] | Freq: Once | INTRAVENOUS | Status: AC | PRN
  Administered 2019-12-18: 500 [IU]
  Filled 2019-12-18: qty 5

## 2019-12-18 MED ORDER — LIDOCAINE HCL URETHRAL/MUCOSAL 2 % EX GEL
CUTANEOUS | Status: AC
  Filled 2019-12-18: qty 30

## 2019-12-18 MED ORDER — LIDOCAINE HCL 1 % IJ SOLN
INTRAMUSCULAR | Status: DC | PRN
  Administered 2019-12-18 (×5): 2 mL

## 2019-12-18 MED ORDER — FENTANYL CITRATE (PF) 100 MCG/2ML IJ SOLN
INTRAMUSCULAR | Status: AC
  Filled 2019-12-18: qty 4

## 2019-12-18 MED ORDER — PHENYLEPHRINE HCL 0.25 % NA SOLN
1.0000 | Freq: Four times a day (QID) | NASAL | Status: DC | PRN
  Administered 2019-12-18: 1 via NASAL

## 2019-12-18 MED ORDER — PHENYLEPHRINE HCL 1 % NA SOLN
NASAL | Status: AC
  Filled 2019-12-18: qty 15

## 2019-12-18 MED ORDER — LIDOCAINE HCL 1 % IJ SOLN
INTRAMUSCULAR | Status: AC
  Filled 2019-12-18: qty 20

## 2019-12-18 MED ORDER — NALOXONE HCL 0.4 MG/ML IJ SOLN
INTRAMUSCULAR | Status: DC | PRN
  Administered 2019-12-18: .2 ug via INTRAVENOUS
  Administered 2019-12-18: .1 ug via INTRAVENOUS

## 2019-12-18 MED ORDER — LIDOCAINE HCL URETHRAL/MUCOSAL 2 % EX GEL
1.0000 "application " | Freq: Once | CUTANEOUS | Status: AC
  Administered 2019-12-18: 1 via TOPICAL

## 2019-12-18 MED ORDER — FENTANYL CITRATE (PF) 100 MCG/2ML IJ SOLN
INTRAMUSCULAR | Status: AC
  Filled 2019-12-18: qty 2

## 2019-12-18 MED ORDER — SODIUM CHLORIDE 0.9% FLUSH
10.0000 mL | INTRAVENOUS | Status: DC | PRN
  Administered 2019-12-18: 3 mL
  Filled 2019-12-18: qty 10

## 2019-12-18 NOTE — Patient Instructions (Signed)
[facility] Discharge Instructions for Patients Receiving Chemotherapy  Today you received the following chemotherapy agents Opdivo  To help prevent nausea and vomiting after your treatment, we encourage you to take your nausea medication as directed   If you develop nausea and vomiting that is not controlled by your nausea medication, call the clinic.   BELOW ARE SYMPTOMS THAT SHOULD BE REPORTED IMMEDIATELY:  *FEVER GREATER THAN 100.5 F  *CHILLS WITH OR WITHOUT FEVER  NAUSEA AND VOMITING THAT IS NOT CONTROLLED WITH YOUR NAUSEA MEDICATION  *UNUSUAL SHORTNESS OF BREATH  *UNUSUAL BRUISING OR BLEEDING  TENDERNESS IN MOUTH AND THROAT WITH OR WITHOUT PRESENCE OF ULCERS  *URINARY PROBLEMS  *BOWEL PROBLEMS  UNUSUAL RASH Items with * indicate a potential emergency and should be followed up as soon as possible.  Feel free to call the clinic should you have any questions or concerns. The clinic phone number is [phone].  Please show the CHEMO ALERT CARD at check-in to the Emergency Department and triage nurse.   

## 2019-12-18 NOTE — Interval H&P Note (Signed)
History and Physical Interval Note:  12/18/2019 1:25 PM  Shaun Kennedy  has presented today for surgery, with the diagnosis of STRIDOR.  The various methods of treatment have been discussed with the patient and family. After consideration of risks, benefits and other options for treatment, the patient has consented to  Procedure(s): VIDEO BRONCHOSCOPY WITHOUT FLUORO (N/A) as a surgical intervention.  The patient's history has been reviewed, patient examined, no change in status, stable for surgery.  I have reviewed the patient's chart and labs.  Questions were answered to the patient's satisfaction.     Collene Gobble

## 2019-12-18 NOTE — Op Note (Signed)
[facility] Patient Name: Shaun Kennedy Procedure Date: 12/18/2019 MRN: [id number] Attending MD: Collene Gobble , MD Date of Birth: [birth year] CSN: [id number] Age: 79 Admit Type: Inpatient Ethnicity: Not Hispanic or Latino Procedure:             Bronchoscopy Indications:           Stridor Providers:             Collene Gobble, MD, Kary Kos RN, RN, Jeanella Cara, RN, Elspeth Cho Tech., Technician,                         Lesia Sago, Technician Referring MD:           Medicines:             Midazolam 3 mg mg IV, Fentanyl 100 mcg IV, Lidocaine                         1% applied to cords 8 mL, Lidocaine 1% applied to the                         tracheobronchial tree 4 mL Complications:         No immediate complications Estimated Blood Loss:  Estimated blood loss: none. Procedure:      Pre-Anesthesia Assessment:      - A History and Physical has been performed. Patient meds and allergies       have been reviewed. The risks and benefits of the procedure and the       sedation options and risks were discussed with the patient. All       questions were answered and informed consent was obtained. Patient       identification and proposed procedure were verified prior to the       procedure by the physician in the pre-procedure area. Mental Status       Examination: alert and oriented. Airway Examination: normal       oropharyngeal airway. Respiratory Examination: stridor. CV Examination:       RRR, no murmurs, no S3 or S4. ASA Grade Assessment: II - A patient with       mild systemic disease. After reviewing the risks and benefits, the       patient was deemed in satisfactory condition to undergo the procedure.       The anesthesia plan was to use moderate sedation / analgesia (conscious       sedation). Immediately prior to administration of medications, the       patient was re-assessed for adequacy to receive  sedatives. The heart       rate, respiratory rate, oxygen saturations, blood pressure, adequacy of       pulmonary ventilation, and response to care were monitored throughout       the procedure. The physical status of the patient was re-assessed after       the procedure.      After obtaining informed consent, the bronchoscope was passed under       direct vision. Throughout the procedure, the patient's blood pressure,       pulse, and oxygen saturations were monitored continuously. the BF-H190       ([id number]) Olympus Bronchoscope  was introduced through the right nostril       and advanced to the tracheobronchial tree. The procedure was       accomplished without difficulty. The patient tolerated the procedure       well. Findings:      The nasopharynx/oropharynx appears normal.      Larynx: Hyperplastic changes were found in the larynx. The changes are       not obstructing the airway. Vocal cord flaccidity. Vocal cord       dysfunction: inspiratory adduction. Vocal cord dysfunction: expiratory       adduction.      Trachea/Carina Abnormalities: Narrowing was found in the upper trachea,       approximately 2-3cm distal to the cords, not entirely circumferential       but question area of mild tracheal stenosis. The lesion has a benign       appearance. The airway is minimally narrowed. The lesion was       successfully traversed.      The main carina is sharp. The tracheobronchial tree was examined to at       least the first subsegmental level. Bronchial mucosa and anatomy are       normal; there are no endobronchial lesions, and no secretions. Impression:      - Stridor      - Hyperplastic changes were seen in the larynx.      - Vocal cord flaccidity was found.      - Vocal cord dysfunction, that is, inappropriate vocal cord adduction,       was visualized on both inspiration and expiration.      - A narrowing was found in the upper trachea. The lesion has a benign        appearance.      - The remaining airway examination was normal.      - No specimens collected. Moderate Sedation:      Moderate (conscious) sedation was personally administered by the       physician performing the procedure. The following parameters were       monitored: oxygen saturation, heart rate, blood pressure, respiratory       rate, EKG, adequacy of pulmonary ventilation, and response to care.       Total physician intraservice time was 16 minutes. Recommendation:      - Refer to/consult with ENT. Procedure Code(s):      --- Professional ---      [phone], Bronchoscopy, rigid or flexible, including fluoroscopic guidance,       when performed; diagnostic, with cell washing, when performed (separate       procedure)      99152, Moderate sedation services provided by the same physician or       other qualified health care professional performing the diagnostic or       therapeutic service that the sedation supports, requiring the presence       of an independent trained observer to assist in the monitoring of the       patient's level of consciousness and physiological status; initial 15       minutes of intraservice time, patient age 51 years or older Diagnosis Code(s):      --- Professional ---      J38.7, Other diseases of larynx      J38.3, Other diseases of vocal cords      J98.4, Other disorders of lung      R06.1, Stridor CPT  copyright 2019 American Medical Association. All rights reserved. The codes documented in this report are preliminary and upon coder review may  be revised to meet current compliance requirements. Collene Gobble, MD Collene Gobble, MD 12/18/2019 2:56:28 PM Number of Addenda: 0 Scope In: Scope Out:

## 2019-12-18 NOTE — Discharge Instructions (Signed)
Flexible Bronchoscopy, Care After This sheet gives you information about how to care for yourself after your test. Your doctor may also give you more specific instructions. If you have problems or questions, contact your doctor. Follow these instructions at home: Eating and drinking  Do not eat or drink anything (not even water) for 2 hours after your test, or until your numbing medicine (local anesthetic) wears off.  When your numbness is gone and your cough and gag reflexes have come back, you may: ? Eat only soft foods. ? Slowly drink liquids.  The day after the test, go back to your normal diet. Driving  Do not drive for 24 hours if you were given a medicine to help you relax (sedative).  Do not drive or use heavy machinery while taking prescription pain medicine. General instructions   Take over-the-counter and prescription medicines only as told by your doctor.  Return to your normal activities as told. Ask what activities are safe for you.  Do not use any products that have nicotine or tobacco in them. This includes cigarettes and e-cigarettes. If you need help quitting, ask your doctor.  Keep all follow-up visits as told by your doctor. This is important. It is very important if you had a tissue sample (biopsy) taken. Get help right away if:  You have shortness of breath that gets worse.  You get light-headed.  You feel like you are going to pass out (faint).  You have chest pain.  You cough up: ? More than a little blood. ? More blood than before. Summary  Do not eat or drink anything (not even water) for 2 hours after your test, or until your numbing medicine wears off.  Do not use cigarettes. Do not use e-cigarettes.  Get help right away if you have chest pain.  Please call our office for any questions or concerns. [phone].   This information is not intended to replace advice given to you by your health care provider. Make sure you discuss any  questions you have with your health care provider. Document Revised: 01/06/2017 Document Reviewed: 02/12/2016 Elsevier Patient Education  2020 [employer].

## 2019-12-19 ENCOUNTER — Encounter: Payer: Self-pay | Admitting: Physician Assistant

## 2019-12-19 ENCOUNTER — Telehealth: Payer: Self-pay | Admitting: Physician Assistant

## 2019-12-19 NOTE — Progress Notes (Signed)
Post call completed for bronchoscopy on 11/10.  Spoke with pt spouse and she stated that pt was having issues with breathing and that his breathing was the same as pre-procedure.  Pt spouse planning to reach out to Dr. Agustina Caroli office today, 11/11.

## 2019-12-19 NOTE — Telephone Encounter (Signed)
Scheduled per los. Called, left msg. Mailed pirntout

## 2019-12-22 ENCOUNTER — Encounter (HOSPITAL_COMMUNITY): Payer: Self-pay | Admitting: Emergency Medicine

## 2019-12-23 ENCOUNTER — Telehealth: Payer: Self-pay | Admitting: Emergency Medicine

## 2019-12-23 DIAGNOSIS — R061 Stridor: Secondary | ICD-10-CM

## 2019-12-23 DIAGNOSIS — J383 Other diseases of vocal cords: Secondary | ICD-10-CM

## 2019-12-23 NOTE — Telephone Encounter (Signed)
LMTC x 1  

## 2019-12-24 NOTE — Telephone Encounter (Signed)
Patient phone number is [phone] c.

## 2019-12-24 NOTE — Telephone Encounter (Signed)
ATC Patient on cell number [phone], left message to call back.  ATC  [phone], unable to leave message, no VM, received busy signal.

## 2019-12-24 NOTE — Telephone Encounter (Signed)
Patient is returning phone call. Patient phone number is [phone].

## 2019-12-24 NOTE — Telephone Encounter (Signed)
Spoke to patient, who stated that it was mentioned by Dr. Lamonte Sakai that he would be referred to ENT.  I do not see that referral has been made nor where it was mentioned in last OV.   Dr. Lamonte Sakai, please advise. Thanks

## 2019-12-24 NOTE — Telephone Encounter (Signed)
Patient is returning phone call. Patient phone number is [phone].

## 2019-12-25 NOTE — Telephone Encounter (Signed)
Called and spoke with patients wife [name] to let her know that we were placing order for referral to ENT for patient and that once they got the referral they would give her a call to schedule an appointment. She expressed understanding. Nothing further needed at this time.

## 2019-12-25 NOTE — Telephone Encounter (Signed)
Yes, please refer him to see either Wilburn Cornelia or Redmond Baseman for stridor and impaired vocal cord movement. Thanks.

## 2019-12-26 ENCOUNTER — Other Ambulatory Visit: Payer: Self-pay | Admitting: Family Medicine

## 2019-12-26 ENCOUNTER — Other Ambulatory Visit: Payer: Self-pay | Admitting: Pulmonary Disease

## 2019-12-26 ENCOUNTER — Other Ambulatory Visit: Payer: Self-pay | Admitting: Gastroenterology

## 2019-12-26 DIAGNOSIS — R0602 Shortness of breath: Secondary | ICD-10-CM

## 2019-12-26 DIAGNOSIS — E119 Type 2 diabetes mellitus without complications: Secondary | ICD-10-CM

## 2019-12-26 DIAGNOSIS — R918 Other nonspecific abnormal finding of lung field: Secondary | ICD-10-CM

## 2019-12-26 NOTE — Telephone Encounter (Signed)
[facility] is requesting to fill pt dronabinol. Please advise [facility]

## 2020-01-01 ENCOUNTER — Other Ambulatory Visit: Payer: Self-pay | Admitting: Physician Assistant

## 2020-01-07 DIAGNOSIS — R69 Illness, unspecified: Secondary | ICD-10-CM | POA: Diagnosis not present

## 2020-01-08 ENCOUNTER — Inpatient Hospital Stay: Payer: Medicare HMO

## 2020-01-08 ENCOUNTER — Encounter: Payer: Self-pay | Admitting: Internal Medicine

## 2020-01-08 ENCOUNTER — Other Ambulatory Visit: Payer: Self-pay

## 2020-01-08 ENCOUNTER — Inpatient Hospital Stay: Payer: Medicare HMO | Attending: Internal Medicine

## 2020-01-08 ENCOUNTER — Inpatient Hospital Stay (HOSPITAL_BASED_OUTPATIENT_CLINIC_OR_DEPARTMENT_OTHER): Payer: Medicare HMO | Admitting: Internal Medicine

## 2020-01-08 VITALS — BP 140/75 | HR 84 | Temp 97.6°F | Resp 18 | Ht 67.0 in | Wt 166.6 lb

## 2020-01-08 DIAGNOSIS — K219 Gastro-esophageal reflux disease without esophagitis: Secondary | ICD-10-CM | POA: Diagnosis not present

## 2020-01-08 DIAGNOSIS — Z923 Personal history of irradiation: Secondary | ICD-10-CM | POA: Insufficient documentation

## 2020-01-08 DIAGNOSIS — Z85038 Personal history of other malignant neoplasm of large intestine: Secondary | ICD-10-CM | POA: Insufficient documentation

## 2020-01-08 DIAGNOSIS — E119 Type 2 diabetes mellitus without complications: Secondary | ICD-10-CM | POA: Diagnosis not present

## 2020-01-08 DIAGNOSIS — Z5112 Encounter for antineoplastic immunotherapy: Secondary | ICD-10-CM | POA: Insufficient documentation

## 2020-01-08 DIAGNOSIS — Z87891 Personal history of nicotine dependence: Secondary | ICD-10-CM | POA: Diagnosis not present

## 2020-01-08 DIAGNOSIS — C3432 Malignant neoplasm of lower lobe, left bronchus or lung: Secondary | ICD-10-CM | POA: Diagnosis not present

## 2020-01-08 DIAGNOSIS — J45909 Unspecified asthma, uncomplicated: Secondary | ICD-10-CM | POA: Diagnosis not present

## 2020-01-08 DIAGNOSIS — I10 Essential (primary) hypertension: Secondary | ICD-10-CM | POA: Diagnosis not present

## 2020-01-08 DIAGNOSIS — Z95828 Presence of other vascular implants and grafts: Secondary | ICD-10-CM

## 2020-01-08 DIAGNOSIS — Z79899 Other long term (current) drug therapy: Secondary | ICD-10-CM | POA: Diagnosis not present

## 2020-01-08 DIAGNOSIS — Z7984 Long term (current) use of oral hypoglycemic drugs: Secondary | ICD-10-CM | POA: Diagnosis not present

## 2020-01-08 DIAGNOSIS — J439 Emphysema, unspecified: Secondary | ICD-10-CM | POA: Insufficient documentation

## 2020-01-08 DIAGNOSIS — Z7982 Long term (current) use of aspirin: Secondary | ICD-10-CM | POA: Diagnosis not present

## 2020-01-08 DIAGNOSIS — I2510 Atherosclerotic heart disease of native coronary artery without angina pectoris: Secondary | ICD-10-CM | POA: Diagnosis not present

## 2020-01-08 DIAGNOSIS — E785 Hyperlipidemia, unspecified: Secondary | ICD-10-CM | POA: Diagnosis not present

## 2020-01-08 LAB — CMP (CANCER CENTER ONLY)
ALT: 14 U/L (ref 0–44)
AST: 18 U/L (ref 15–41)
Albumin: 3.1 g/dL — ABNORMAL LOW (ref 3.5–5.0)
Alkaline Phosphatase: 60 U/L (ref 38–126)
Anion gap: 7 (ref 5–15)
BUN: 15 mg/dL (ref 8–23)
CO2: 28 mmol/L (ref 22–32)
Calcium: 9.5 mg/dL (ref 8.9–10.3)
Chloride: 104 mmol/L (ref 98–111)
Creatinine: 0.98 mg/dL (ref 0.61–1.24)
GFR, Estimated: >60 mL/min (ref >60)
Glucose, Bld: 104 mg/dL — ABNORMAL HIGH (ref 70–99)
Potassium: 4 mmol/L (ref 3.5–5.1)
Sodium: 139 mmol/L (ref 135–145)
Total Bilirubin: 0.3 mg/dL (ref 0.3–1.2)
Total Protein: 7.8 g/dL (ref 6.5–8.1)

## 2020-01-08 LAB — CBC WITH DIFFERENTIAL (CANCER CENTER ONLY)
Abs Immature Granulocytes: 0.02 10*3/uL (ref 0.00–0.07)
Basophils Absolute: 0 10*3/uL (ref 0.0–0.1)
Basophils Relative: 0 %
Eosinophils Absolute: 0.1 10*3/uL (ref 0.0–0.5)
Eosinophils Relative: 2 %
HCT: 29.5 % — ABNORMAL LOW (ref 39.0–52.0)
Hemoglobin: 9.8 g/dL — ABNORMAL LOW (ref 13.0–17.0)
Immature Granulocytes: 0 %
Lymphocytes Relative: 25 %
Lymphs Abs: 1.5 10*3/uL (ref 0.7–4.0)
MCH: 30.6 pg (ref 26.0–34.0)
MCHC: 33.2 g/dL (ref 30.0–36.0)
MCV: 92.2 fL (ref 80.0–100.0)
Monocytes Absolute: 0.7 10*3/uL (ref 0.1–1.0)
Monocytes Relative: 12 %
Neutro Abs: 3.6 10*3/uL (ref 1.7–7.7)
Neutrophils Relative %: 61 %
Platelet Count: 352 10*3/uL (ref 150–400)
RBC: 3.2 MIL/uL — ABNORMAL LOW (ref 4.22–5.81)
RDW: 11.9 % (ref 11.5–15.5)
WBC Count: 5.9 10*3/uL (ref 4.0–10.5)
nRBC: 0 % (ref 0.0–0.2)

## 2020-01-08 LAB — TSH: TSH: 0.802 u[IU]/mL (ref 0.320–4.118)

## 2020-01-08 MED ORDER — HEPARIN SOD (PORK) LOCK FLUSH 100 UNIT/ML IV SOLN
500.0000 [IU] | Freq: Once | INTRAVENOUS | Status: AC | PRN
  Administered 2020-01-08: 500 [IU]
  Filled 2020-01-08: qty 5

## 2020-01-08 MED ORDER — SODIUM CHLORIDE 0.9 % IV SOLN
1.0000 mg/kg | Freq: Once | INTRAVENOUS | Status: AC
  Administered 2020-01-08: 70 mg via INTRAVENOUS
  Filled 2020-01-08: qty 14

## 2020-01-08 MED ORDER — SODIUM CHLORIDE 0.9 % IV SOLN
360.0000 mg | Freq: Once | INTRAVENOUS | Status: AC
  Administered 2020-01-08: 360 mg via INTRAVENOUS
  Filled 2020-01-08: qty 24

## 2020-01-08 MED ORDER — DIPHENHYDRAMINE HCL 50 MG/ML IJ SOLN
25.0000 mg | Freq: Once | INTRAMUSCULAR | Status: AC
  Administered 2020-01-08: 25 mg via INTRAVENOUS

## 2020-01-08 MED ORDER — DIPHENHYDRAMINE HCL 50 MG/ML IJ SOLN
INTRAMUSCULAR | Status: AC
  Filled 2020-01-08: qty 1

## 2020-01-08 MED ORDER — SODIUM CHLORIDE 0.9 % IV SOLN
Freq: Once | INTRAVENOUS | Status: AC
  Filled 2020-01-08: qty 250

## 2020-01-08 MED ORDER — SODIUM CHLORIDE 0.9% FLUSH
10.0000 mL | Freq: Once | INTRAVENOUS | Status: AC
  Administered 2020-01-08: 10 mL
  Filled 2020-01-08: qty 10

## 2020-01-08 MED ORDER — SODIUM CHLORIDE 0.9% FLUSH
10.0000 mL | INTRAVENOUS | Status: DC | PRN
  Administered 2020-01-08: 10 mL
  Filled 2020-01-08: qty 10

## 2020-01-08 MED ORDER — FAMOTIDINE IN NACL 20-0.9 MG/50ML-% IV SOLN
20.0000 mg | Freq: Once | INTRAVENOUS | Status: AC
  Administered 2020-01-08: 20 mg via INTRAVENOUS

## 2020-01-08 MED ORDER — FAMOTIDINE IN NACL 20-0.9 MG/50ML-% IV SOLN
INTRAVENOUS | Status: AC
  Filled 2020-01-08: qty 50

## 2020-01-08 NOTE — Progress Notes (Signed)
[place] [phone]   [phone]  OFFICE PROGRESS NOTE  [name], [place] [address]  DIAGNOSIS: stage IV (T3, N2, M1 a) non-small cell lung cancer, squamous cell carcinoma presented with obstructive left lower lobe lung mass in addition to mediastinal lymphadenopathy as well as bilateral pulmonary nodules diagnosed in July 2021.  PDL1: 0%  PRIOR THERAPY: None  CURRENT THERAPY: 1) Palliative radiotherapy to the obstructive left lower lobe lung mass under the care of Dr. Lisbeth Renshaw.  2)  systemic chemotherapy 2 cycles of chemotherapy with carboplatin for an AUC of 5 and paclitaxel 175 mg/m2 in addition to immunotherapy with nivolumab 360 mg every 3 weeks and ipilimumab 1 mg/kg IV every 6 weeks followed by maintenance nivolumab and ipilimumab.  He started the first treatment on 09/04/2019.  He is status post 3 cycles of the treatment.  INTERVAL HISTORY: Shaun Kennedy 79 y.o. male returns to the clinic today for follow-up visit.  The patient is feeling fine today with no concerning complaints except for mild fatigue and shortness of breath with exertion.  He denied having any chest pain but has mild cough with no hemoptysis.  He denied having any nausea, vomiting, diarrhea or constipation.  He has no headache or visual changes.  He continues to tolerate his treatment with maintenance nivolumab and ipilimumab fairly well.  He is here today for evaluation before starting cycle #4.  MEDICAL HISTORY: Past Medical History:  Diagnosis Date  . Allergic rhinitis   . Asthma   . Carotid stenosis   . Colon cancer ([place]) 2003  . Diabetes mellitus ([place])   . Diverticulosis   . Dyslipidemia   . ED (erectile dysfunction)   . GERD (gastroesophageal reflux disease)   . H/O degenerative disc disease   . Hemorrhoids   . HTN (hypertension)   . Hyperlipidemia   . Lung cancer ([place])   . LVH (left ventricular hypertrophy)    on  EKG  . Mass of lower lobe of left lung   . Mediastinal adenopathy   . Smoker    former  . Wears dentures   . Wears dentures   . Wears glasses   . Wears glasses     ALLERGIES:  has No Known Allergies.  MEDICATIONS:  Current Outpatient Medications  Medication Sig Dispense Refill  . amLODipine (NORVASC) 5 MG tablet TAKE 1 TABLET BY MOUTH EVERY DAY (Patient taking differently: Take 5 mg by mouth daily. ) 90 tablet 3  . aspirin EC 81 MG tablet Take 81 mg by mouth daily after breakfast.     . Blood Glucose Monitoring Suppl (ONETOUCH VERIO) w/Device KIT USE TO CHECK SUGAR DAILY 1 kit 0  . Blood Glucose Monitoring Suppl (ONETOUCH VERIO) w/Device KIT USE TO CHECK SUGAR DAILY 1 kit 0  . cholecalciferol (VITAMIN D) 1000 units tablet Take 1,000 Units by mouth daily after breakfast.     . cilostazol (PLETAL) 100 MG tablet Take 100 mg by mouth 2 (two) times daily.    Marland Kitchen dronabinol (MARINOL) 2.5 MG capsule TAKE 1 CAPSULE BY MOUTH 2 TIMES DAILY BEFORE A MEAL. 30 capsule 1  . ferrous sulfate 325 (65 FE) MG EC tablet Take 1 tablet (325 mg total) by mouth daily with breakfast. 30 tablet 12  . gabapentin (NEURONTIN) 100 MG capsule Take 1 capsule (100 mg total) by mouth 3 (three) times daily. 90 capsule 1  . guaiFENesin-codeine 100-10 MG/5ML syrup TAKE  5ML BY MOUTH 3 TIMES A DAY AS NEEDED FOR COUGH (Patient taking differently: Take 5 mLs by mouth 3 (three) times daily as needed for cough. ) 240 mL 0  . Lancets (ONETOUCH DELICA PLUS [id number]) MISC PATIENT IS TO TEST TWO TIMES A DAY DX: E11.9 (ONETOUCH VERIO FLEX) 100 each 12  . lidocaine-prilocaine (EMLA) cream Apply to the Port-A-Cath site 30-60-minute before chemotherapy. (Patient taking differently: Apply 1 application topically daily as needed (port access). Apply to the Port-A-Cath site 30-60-minute before chemotherapy.) 30 g 0  . LINZESS 72 MCG capsule Take 72 mcg by mouth every morning.    Marland Kitchen lisinopril-hydrochlorothiazide (ZESTORETIC) 20-12.5 MG tablet  TAKE 1 TABLET BY MOUTH EVERY DAY (Patient taking differently: Take 1 tablet by mouth daily. ) 90 tablet 2  . metFORMIN (GLUCOPHAGE-XR) 500 MG 24 hr tablet TAKE 1 TABLET BY MOUTH EVERY DAY 90 tablet 1  . mirtazapine (REMERON) 15 MG tablet TAKE 1 TABLET BY MOUTH EVERYDAY AT BEDTIME 90 tablet 1  . Multiple Vitamin (MULTIVITAMIN WITH MINERALS) TABS tablet Take 1 tablet by mouth daily after breakfast.     . ONETOUCH VERIO test strip USE AS INSTRUCTED TO CHECK TWICE DAILY 200 strip 5  . simvastatin (ZOCOR) 40 MG tablet Take 1 tablet (40 mg total) by mouth daily. (Patient taking differently: Take 40 mg by mouth every evening. ) 90 tablet 3  . sucralfate (CARAFATE) 1 GM/10ML suspension TAKE 10 MLS BY MOUTH 4 TIMES DAILY - WITH MEALS AND AT BEDTIME. (Patient taking differently: Take 1 g by mouth 4 (four) times daily -  with meals and at bedtime. ) 420 mL 0   No current facility-administered medications for this visit.   Facility-Administered Medications Ordered in Other Visits  Medication Dose Route Frequency Provider Last Rate Last Admin  . sodium chloride flush (NS) 0.9 % injection 10 mL  10 mL Intracatheter PRN Curt Bears, MD   10 mL at 09/04/19 1556    SURGICAL HISTORY:  Past Surgical History:  Procedure Laterality Date  . BRONCHIAL BIOPSY  08/20/2019   Procedure: BRONCHIAL BIOPSIES;  Surgeon: Collene Gobble, MD;  Location[facility] ENDOSCOPY;  Service: Pulmonary;;  . BRONCHIAL BRUSHINGS  08/20/2019   Procedure: BRONCHIAL BRUSHINGS;  Surgeon: Collene Gobble, MD;  Location: [facility] ENDOSCOPY;  Service: Pulmonary;;  . BRONCHIAL NEEDLE ASPIRATION BIOPSY  08/20/2019   Procedure: BRONCHIAL NEEDLE ASPIRATION BIOPSIES;  Surgeon: Collene Gobble, MD;  Location: [facility] ENDOSCOPY;  Service: Pulmonary;;  . CARPAL TUNNEL RELEASE Right 01/09/2019   Procedure: CARPAL TUNNEL RELEASE;  Surgeon: Leandrew Koyanagi, MD;  Location: [place];  Service: Orthopedics;  Laterality: Right;  . CATARACT EXTRACTION Right  2018  . COLONOSCOPY  2007   Dr. Benson Norway  . I & D EXTREMITY Right 04/02/2016   Procedure: IRRIGATION AND DEBRIDEMENT GREAT TOE;  Surgeon: Leandrew Koyanagi, MD;  Location: [place];  Service: Orthopedics;  Laterality: Right;  . IR IMAGING GUIDED PORT INSERTION  08/30/2019  . MULTIPLE TOOTH EXTRACTIONS    . RADIOACTIVE SEED IMPLANT  2003  . ULNAR TUNNEL RELEASE Right 01/09/2019   Procedure: RIGHT CUBITAL TUNNEL RELEASE AND CARPAL TUNNEL RELEASE;  Surgeon: Leandrew Koyanagi, MD;  Location: [place];  Service: Orthopedics;  Laterality: Right;  . UPPER GASTROINTESTINAL ENDOSCOPY    . VIDEO BRONCHOSCOPY N/A 12/18/2019   Procedure: VIDEO BRONCHOSCOPY WITHOUT FLUORO;  Surgeon: Collene Gobble, MD;  Location: [facility] ENDOSCOPY;  Service: Cardiopulmonary;  Laterality: N/A;  . VIDEO  BRONCHOSCOPY WITH ENDOBRONCHIAL ULTRASOUND N/A 08/20/2019   Procedure: VIDEO BRONCHOSCOPY WITH ENDOBRONCHIAL ULTRASOUND;  Surgeon: Collene Gobble, MD;  Location: [facility] ENDOSCOPY;  Service: Pulmonary;  Laterality: N/A;    REVIEW OF SYSTEMS:  A comprehensive review of systems was negative except for: Constitutional: positive for fatigue Respiratory: positive for cough and dyspnea on exertion   PHYSICAL EXAMINATION: General appearance: alert, cooperative, fatigued and no distress Head: Normocephalic, without obvious abnormality, atraumatic Neck: no adenopathy, no JVD, supple, symmetrical, trachea midline and thyroid not enlarged, symmetric, no tenderness/mass/nodules Lymph nodes: Cervical, supraclavicular, and axillary nodes normal. Resp: clear to auscultation bilaterally Back: symmetric, no curvature. ROM normal. No CVA tenderness. Cardio: regular rate and rhythm, S1, S2 normal, no murmur, click, rub or gallop GI: soft, non-tender; bowel sounds normal; no masses,  no organomegaly Extremities: extremities normal, atraumatic, no cyanosis or edema  ECOG PERFORMANCE STATUS: 1 - Symptomatic but completely ambulatory  Blood pressure  140/75, pulse 84, temperature 97.6 F (36.4 C), temperature source Tympanic, resp. rate 18, height '5\' 7"'  (1.702 m), weight 166 lb 9.6 oz (75.6 kg), SpO2 98 %.  LABORATORY DATA: Lab Results  Component Value Date   WBC 5.9 01/08/2020   HGB 9.8 (L) 01/08/2020   HCT 29.5 (L) 01/08/2020   MCV 92.2 01/08/2020   PLT 352 01/08/2020      Chemistry      Component Value Date/Time   NA 139 01/08/2020 1051   NA 132 (L) 08/14/2019 0918   K 4.0 01/08/2020 1051   CL 104 01/08/2020 1051   CO2 28 01/08/2020 1051   BUN 15 01/08/2020 1051   BUN 16 08/14/2019 0918   CREATININE 0.98 01/08/2020 1051   CREATININE 1.25 (H) 08/08/2016 0912      Component Value Date/Time   CALCIUM 9.5 01/08/2020 1051   ALKPHOS 60 01/08/2020 1051   AST 18 01/08/2020 1051   ALT 14 01/08/2020 1051   BILITOT 0.3 01/08/2020 1051       RADIOGRAPHIC STUDIES: CT Chest W Contrast  Result Date: 12/12/2019 CLINICAL DATA:  Non-small-cell lung cancer diagnosed in 2019. Ongoing chemotherapy. Radiation therapy completed in 2020. EXAM: CT CHEST, ABDOMEN, AND PELVIS WITH CONTRAST TECHNIQUE: Multidetector CT imaging of the chest, abdomen and pelvis was performed following the standard protocol during bolus administration of intravenous contrast. CONTRAST:  129m OMNIPAQUE IOHEXOL 300 MG/ML  SOLN COMPARISON:  09/13/2019 PET.  CTA chest 10/18/2019. FINDINGS: CT CHEST FINDINGS Cardiovascular: Right Port-A-Cath tip at superior caval/atrial junction. Advanced aortic and branch vessel atherosclerosis. Mild cardiomegaly, without pericardial effusion. Multivessel coronary artery atherosclerosis. No central pulmonary embolism, on this non-dedicated study. Mediastinum/Nodes: Low right paratracheal node measures 1.0 cm on 22/2 versus 1.3 cm on the prior. Subcarinal node measures 1.8 cm on 27/2 versus 1.9 cm on the prior. No hilar adenopathy. Lungs/Pleura: Trace left pleural thickening and fluid, decreased. Moderate centrilobular emphysema. Presumed  secretions in the right-side of the trachea. Bilateral pulmonary nodules. Index right middle lobe 5 mm nodule on 85/4 is similar to 10/18/2019. Index left upper lobe pulmonary nodule measures 6 mm on 25/4 versus 7 mm on the prior. Pleural-based left lower lobe 6 mm nodule on 93/4 is decreased from 14 x 11 mm on 10/18/2019. Medial left lower lung predominant ground-glass and less so airspace disease is significantly progressive, likely radiation induced. Central superior segment left lower lobe centrally cavitary lung mass measures 3.6 x 2.5 cm on 55/4. Compared 4.4 x 2.8 cm on 10/18/2019. Musculoskeletal: No acute osseous abnormality. CT ABDOMEN PELVIS  FINDINGS Hepatobiliary: Normal liver. Normal gallbladder, without biliary ductal dilatation. Pancreas: Normal, without mass or ductal dilatation. Spleen: Normal in size, without focal abnormality. Adrenals/Urinary Tract: Normal adrenal glands. Bilateral renal vascular calcifications. Upper pole right renal sinus cyst of 1.7 cm. Normal urinary bladder. Stomach/Bowel: Proximal gastric underdistention. Colonic stool burden suggests constipation. Normal terminal ileum. Nonobstructive small bowel within a right inguinal hernia on 113/2. Vascular/Lymphatic: Advanced aortic and branch vessel atherosclerosis. No abdominopelvic adenopathy. Reproductive: Radiation seeds within the prostate. Other: No significant free fluid. No evidence of omental or peritoneal disease. Musculoskeletal: Lumbosacral spondylosis IMPRESSION: 1. Since the CTA of the chest of 10/18/2019, response to therapy of superior segment left lower lobe cavitary lung mass and thoracic nodal metastasis. 2. Similar to minimal improvement in pulmonary metastasis. 3. New or progressive presumably radiation induced ground-glass and less so airspace disease within the left lung. 4. No acute process or evidence of metastatic disease in the abdomen or pelvis. 5. Aortic atherosclerosis (ICD10-I70.0), coronary artery  atherosclerosis and emphysema (ICD10-J43.9). 6.  Possible constipation. 7. Nonobstructive small bowel containing right inguinal hernia. Electronically Signed   By: Abigail Miyamoto M.D.   On: 12/12/2019 08:27   CT Abdomen Pelvis W Contrast  Result Date: 12/12/2019 CLINICAL DATA:  Non-small-cell lung cancer diagnosed in 2019. Ongoing chemotherapy. Radiation therapy completed in 2020. EXAM: CT CHEST, ABDOMEN, AND PELVIS WITH CONTRAST TECHNIQUE: Multidetector CT imaging of the chest, abdomen and pelvis was performed following the standard protocol during bolus administration of intravenous contrast. CONTRAST:  151m OMNIPAQUE IOHEXOL 300 MG/ML  SOLN COMPARISON:  09/13/2019 PET.  CTA chest 10/18/2019. FINDINGS: CT CHEST FINDINGS Cardiovascular: Right Port-A-Cath tip at superior caval/atrial junction. Advanced aortic and branch vessel atherosclerosis. Mild cardiomegaly, without pericardial effusion. Multivessel coronary artery atherosclerosis. No central pulmonary embolism, on this non-dedicated study. Mediastinum/Nodes: Low right paratracheal node measures 1.0 cm on 22/2 versus 1.3 cm on the prior. Subcarinal node measures 1.8 cm on 27/2 versus 1.9 cm on the prior. No hilar adenopathy. Lungs/Pleura: Trace left pleural thickening and fluid, decreased. Moderate centrilobular emphysema. Presumed secretions in the right-side of the trachea. Bilateral pulmonary nodules. Index right middle lobe 5 mm nodule on 85/4 is similar to 10/18/2019. Index left upper lobe pulmonary nodule measures 6 mm on 25/4 versus 7 mm on the prior. Pleural-based left lower lobe 6 mm nodule on 93/4 is decreased from 14 x 11 mm on 10/18/2019. Medial left lower lung predominant ground-glass and less so airspace disease is significantly progressive, likely radiation induced. Central superior segment left lower lobe centrally cavitary lung mass measures 3.6 x 2.5 cm on 55/4. Compared 4.4 x 2.8 cm on 10/18/2019. Musculoskeletal: No acute osseous  abnormality. CT ABDOMEN PELVIS FINDINGS Hepatobiliary: Normal liver. Normal gallbladder, without biliary ductal dilatation. Pancreas: Normal, without mass or ductal dilatation. Spleen: Normal in size, without focal abnormality. Adrenals/Urinary Tract: Normal adrenal glands. Bilateral renal vascular calcifications. Upper pole right renal sinus cyst of 1.7 cm. Normal urinary bladder. Stomach/Bowel: Proximal gastric underdistention. Colonic stool burden suggests constipation. Normal terminal ileum. Nonobstructive small bowel within a right inguinal hernia on 113/2. Vascular/Lymphatic: Advanced aortic and branch vessel atherosclerosis. No abdominopelvic adenopathy. Reproductive: Radiation seeds within the prostate. Other: No significant free fluid. No evidence of omental or peritoneal disease. Musculoskeletal: Lumbosacral spondylosis IMPRESSION: 1. Since the CTA of the chest of 10/18/2019, response to therapy of superior segment left lower lobe cavitary lung mass and thoracic nodal metastasis. 2. Similar to minimal improvement in pulmonary metastasis. 3. New or progressive presumably radiation induced  ground-glass and less so airspace disease within the left lung. 4. No acute process or evidence of metastatic disease in the abdomen or pelvis. 5. Aortic atherosclerosis (ICD10-I70.0), coronary artery atherosclerosis and emphysema (ICD10-J43.9). 6.  Possible constipation. 7. Nonobstructive small bowel containing right inguinal hernia. Electronically Signed   By: Abigail Miyamoto M.D.   On: 12/12/2019 08:27    ASSESSMENT AND PLAN: This is a very pleasant 79 years old [name] with stage IV non-small cell lung cancer, squamous cell carcinoma with negative PD-L1 expression. The patient is currently undergoing systemic chemotherapy with carboplatin and paclitaxel for 2 cycles in addition to immunotherapy with ipilimumab 1 mg/KG every 6 weeks and nivolumab 360 mg IV every 3 weeks status post 3 cycles.  The patient  continues to tolerate his treatment well with no concerning adverse effects. I recommended for him to proceed with day 1 of cycle #4 today as planned. I will see him back for follow-up visit in 3 weeks for evaluation before starting day #22 of cycle #4. The patient was advised to call immediately if he has any concerning symptoms in the interval. The patient voices understanding of current disease status and treatment options and is in agreement with the current care plan.  All questions were answered. The patient knows to call the clinic with any problems, questions or concerns. We can certainly see the patient much sooner if necessary.  Disclaimer: This note was dictated with voice recognition software. Similar sounding words can inadvertently be transcribed and may not be corrected upon review.

## 2020-01-08 NOTE — Patient Instructions (Signed)
[facility] Discharge Instructions for Patients Receiving Chemotherapy  Today you received the following chemotherapy agents: Shaun Kennedy  To help prevent nausea and vomiting after your treatment, we encourage you to take your nausea medication as directed.    If you develop nausea and vomiting that is not controlled by your nausea medication, call the clinic.   BELOW ARE SYMPTOMS THAT SHOULD BE REPORTED IMMEDIATELY:  *FEVER GREATER THAN 100.5 F  *CHILLS WITH OR WITHOUT FEVER  NAUSEA AND VOMITING THAT IS NOT CONTROLLED WITH YOUR NAUSEA MEDICATION  *UNUSUAL SHORTNESS OF BREATH  *UNUSUAL BRUISING OR BLEEDING  TENDERNESS IN MOUTH AND THROAT WITH OR WITHOUT PRESENCE OF ULCERS  *URINARY PROBLEMS  *BOWEL PROBLEMS  UNUSUAL RASH Items with * indicate a potential emergency and should be followed up as soon as possible.  Feel free to call the clinic should you have any questions or concerns. The clinic phone number is [phone].  Please show the [facility] at check-in to the Emergency Department and triage nurse.

## 2020-01-08 NOTE — Progress Notes (Signed)
Patient ambulated from facility in stable condition with no complaints 

## 2020-01-16 ENCOUNTER — Other Ambulatory Visit: Payer: Self-pay | Admitting: Gastroenterology

## 2020-01-16 ENCOUNTER — Other Ambulatory Visit: Payer: Self-pay | Admitting: Family Medicine

## 2020-01-16 DIAGNOSIS — E1169 Type 2 diabetes mellitus with other specified complication: Secondary | ICD-10-CM

## 2020-01-23 ENCOUNTER — Telehealth: Payer: Self-pay | Admitting: Emergency Medicine

## 2020-01-23 DIAGNOSIS — J343 Hypertrophy of nasal turbinates: Secondary | ICD-10-CM | POA: Diagnosis not present

## 2020-01-23 DIAGNOSIS — J386 Stenosis of larynx: Secondary | ICD-10-CM | POA: Diagnosis not present

## 2020-01-23 NOTE — Telephone Encounter (Signed)
Not needed.Shaun Kennedy

## 2020-01-24 DIAGNOSIS — J386 Stenosis of larynx: Secondary | ICD-10-CM | POA: Insufficient documentation

## 2020-01-27 ENCOUNTER — Ambulatory Visit (INDEPENDENT_AMBULATORY_CARE_PROVIDER_SITE_OTHER): Payer: Medicare HMO | Admitting: Emergency Medicine

## 2020-01-27 ENCOUNTER — Encounter: Payer: Self-pay | Admitting: Emergency Medicine

## 2020-01-27 ENCOUNTER — Other Ambulatory Visit: Payer: Self-pay

## 2020-01-27 DIAGNOSIS — R061 Stridor: Secondary | ICD-10-CM | POA: Diagnosis not present

## 2020-01-27 DIAGNOSIS — C3492 Malignant neoplasm of unspecified part of left bronchus or lung: Secondary | ICD-10-CM

## 2020-01-27 NOTE — Patient Instructions (Signed)
We discussed the options available to help with your vocal cord abnormalities.  Dr. Redmond Baseman is following.  It sounds like the best option may be tracheostomy in order to give you immediate breathing relief and also protect your voice from any changes. Dr. Lamonte Sakai will discuss with Dr. Redmond Baseman, help plan for tracheostomy going forward. We will not restart any inhaled medication at this time Continue to follow with Dr. Julien Nordmann.  Repeat chest imaging as per Dr. Worthy Flank plans. Follow with Dr. Lamonte Sakai in 2 months or sooner if you have any problems.

## 2020-01-27 NOTE — Progress Notes (Signed)
Subjective:    Patient ID: Shaun Kennedy, male    DOB: 09/12/1940, 79 y.o.   MRN: [id number]  HPI 79 year old former smoker (25 pack years) with a history of allergic rhinitis, asthma, prostate cancer, diabetes.   He was seen in the emergency department on 08/16/2019 for shortness of breath and cough.  The cough started about 2-3 weeks ago. No hemoptysis. He has lost 80 lbs over the last year, unexplained. He is off his Pletal, last took it 2 weeks ago.   A CT scan of his chest and abdomen were performed on 7/9 which I have reviewed, shows a 6.7 x 4.8 cm heterogeneous left lower lobe medial mass with numerous bilateral noncalcified nodules.  Also mediastinal, subcarinal lymphadenopathy.  ROV 12/11/19 --follow-up visit, former smoker, with a history of prostate cancer, diabetes, asthma and newly diagnosed squamous cell lung cancer (08/2019) stage IV.  He was treated with palliative XRT to the left lower lobe mass, systemic chemotherapy with immunotherapy (nivolumab and ipilimumab).  He was seen in the emergency department 10/18/2019 for dyspnea and underwent his most recent CT chest 10/18/2019 that showed no PE, improved mediastinal adenopathy, improved multifocal pulmonary nodular disease and a decrease in size of his primary left lower lobe mass.  There was a larger left pleural based nodule.  Small left effusion.  He was treated in our office with a prednisone taper 11/06/2019. Was started on Stiolto, converted over to Anoro, doesn't believe it is helping him much. He reports insp stridor, believes it is new since the bronchoscopy. Doesn't believe that the prednisone helped him.  Difficult to get a deep breath in - feels it more on the L.    Pulmonary function testing done today reviewed by me, show mild obstruction without a bronchodilator response, restricted lung volumes and a normal diffusion capacity.  His flow volume loop of his inspiratory curve, to a lesser degree his expiratory curve is  well  ROV 01/27/20 --Mr. Mainwaring is 34, follows up today for his history of asthmatic COPD, newly diagnosed stage IV squamous cell lung cancer.  Has been undergoing palliative XRT to the left lower lobe mass.  He has an associated small left effusion.  His course has been concerning for stridor and possible vocal cord injury post bronchoscopy.  Did not change with prednisone.  He saw Dr. Redmond Baseman on 12/16 and his fiberoptic exam showed poor abduction of the vocal folds with glottic narrowing likely causing his symptoms.  Unclear cause.  I am concerned that this was related to his intubation, ? Possibly his radiation. They discussed a possible airway vs surgery.  MDM: Reviewed ENT notes 01/23/20    Review of Systems As per HPI      Objective:   Physical Exam  Vitals:   01/27/20 1052  BP: 118/76  Pulse: 76  Temp: 98.1 F (36.7 C)  SpO2: 97%  Weight: 172 lb 3.2 oz (78.1 kg)  Height: 5\' 7"  (1.702 m)   Gen: Pleasant, well-nourished, in no distress,  normal affect  ENT: No lesions,  mouth clear,  oropharynx clear, no postnasal drip  Neck: No JVD, significant biphasic stridor with normal breaths  Lungs: No use of accessory muscles, referred upper airway noise and stridor, decreased on the L  Cardiovascular: RRR, heart sounds normal, no murmur or gallops, no peripheral edema  Musculoskeletal: No deformities, no cyanosis or clubbing  Neuro: alert, awake, non focal  Skin: Warm, no lesions or rash      Assessment &  Plan:  Stridor Dr. Redmond Baseman identified abnormal abduction of his cords.  I discussed with Mr. Deloatch today some of the same issues that were brought up with Dr. Redmond Baseman, namely the option of a surgical procedure to his cords versus tracheostomy.  It would seem the tracheostomy would give him more certain, more immediate relief.  Also it would decrease the risk that he develops vocal quality changes.  The patient is willing to proceed with tracheostomy has asked that I contact Dr.  Redmond Baseman to discuss.  I will work on this.  Lung cancer [facility]) Following with Dr. Julien Nordmann currently.  Chemoradiation ongoing.  Repeat imaging as per Dr. Worthy Flank plans  Baltazar Apo, MD, PhD 01/27/2020, 11:20 AM [facility] [phone] or if no answer [phone]

## 2020-01-27 NOTE — Assessment & Plan Note (Signed)
Dr. Redmond Baseman identified abnormal abduction of his cords.  I discussed with Mr. Mcphee today some of the same issues that were brought up with Dr. Redmond Baseman, namely the option of a surgical procedure to his cords versus tracheostomy.  It would seem the tracheostomy would give him more certain, more immediate relief.  Also it would decrease the risk that he develops vocal quality changes.  The patient is willing to proceed with tracheostomy has asked that I contact Dr. Redmond Baseman to discuss.  I will work on this.

## 2020-01-27 NOTE — Assessment & Plan Note (Signed)
Following with Dr. Julien Nordmann currently.  Chemoradiation ongoing.  Repeat imaging as per Dr. Worthy Flank plans

## 2020-01-28 ENCOUNTER — Inpatient Hospital Stay: Payer: Medicare HMO

## 2020-01-28 ENCOUNTER — Encounter: Payer: Self-pay | Admitting: Internal Medicine

## 2020-01-28 ENCOUNTER — Inpatient Hospital Stay: Payer: Medicare HMO | Admitting: Internal Medicine

## 2020-01-28 ENCOUNTER — Other Ambulatory Visit: Payer: Self-pay

## 2020-01-28 VITALS — BP 143/75 | HR 77 | Temp 97.5°F | Resp 18 | Ht 67.0 in | Wt 171.9 lb

## 2020-01-28 DIAGNOSIS — Z5112 Encounter for antineoplastic immunotherapy: Secondary | ICD-10-CM | POA: Diagnosis not present

## 2020-01-28 DIAGNOSIS — C3432 Malignant neoplasm of lower lobe, left bronchus or lung: Secondary | ICD-10-CM

## 2020-01-28 LAB — CMP (CANCER CENTER ONLY)
ALT: 15 U/L (ref 0–44)
AST: 16 U/L (ref 15–41)
Albumin: 3.1 g/dL — ABNORMAL LOW (ref 3.5–5.0)
Alkaline Phosphatase: 57 U/L (ref 38–126)
Anion gap: 6 (ref 5–15)
BUN: 15 mg/dL (ref 8–23)
CO2: 27 mmol/L (ref 22–32)
Calcium: 9 mg/dL (ref 8.9–10.3)
Chloride: 107 mmol/L (ref 98–111)
Creatinine: 0.97 mg/dL (ref 0.61–1.24)
GFR, Estimated: >60 mL/min (ref >60)
Glucose, Bld: 135 mg/dL — ABNORMAL HIGH (ref 70–99)
Potassium: 3.7 mmol/L (ref 3.5–5.1)
Sodium: 140 mmol/L (ref 135–145)
Total Bilirubin: 0.6 mg/dL (ref 0.3–1.2)
Total Protein: 7.4 g/dL (ref 6.5–8.1)

## 2020-01-28 LAB — CBC WITH DIFFERENTIAL (CANCER CENTER ONLY)
Abs Immature Granulocytes: 0.01 10*3/uL (ref 0.00–0.07)
Basophils Absolute: 0 10*3/uL (ref 0.0–0.1)
Basophils Relative: 0 %
Eosinophils Absolute: 0.2 10*3/uL (ref 0.0–0.5)
Eosinophils Relative: 4 %
HCT: 29.3 % — ABNORMAL LOW (ref 39.0–52.0)
Hemoglobin: 9.7 g/dL — ABNORMAL LOW (ref 13.0–17.0)
Immature Granulocytes: 0 %
Lymphocytes Relative: 20 %
Lymphs Abs: 1.2 10*3/uL (ref 0.7–4.0)
MCH: 30.8 pg (ref 26.0–34.0)
MCHC: 33.1 g/dL (ref 30.0–36.0)
MCV: 93 fL (ref 80.0–100.0)
Monocytes Absolute: 0.7 10*3/uL (ref 0.1–1.0)
Monocytes Relative: 11 %
Neutro Abs: 3.7 10*3/uL (ref 1.7–7.7)
Neutrophils Relative %: 65 %
Platelet Count: 259 10*3/uL (ref 150–400)
RBC: 3.15 MIL/uL — ABNORMAL LOW (ref 4.22–5.81)
RDW: 12.6 % (ref 11.5–15.5)
WBC Count: 5.8 10*3/uL (ref 4.0–10.5)
nRBC: 0 % (ref 0.0–0.2)

## 2020-01-28 LAB — TSH: TSH: 0.764 u[IU]/mL (ref 0.320–4.118)

## 2020-01-28 MED ORDER — SODIUM CHLORIDE 0.9% FLUSH
10.0000 mL | INTRAVENOUS | Status: DC | PRN
  Administered 2020-01-28: 10 mL
  Filled 2020-01-28: qty 10

## 2020-01-28 MED ORDER — SODIUM CHLORIDE 0.9 % IV SOLN
Freq: Once | INTRAVENOUS | Status: AC
  Filled 2020-01-28: qty 250

## 2020-01-28 MED ORDER — HEPARIN SOD (PORK) LOCK FLUSH 100 UNIT/ML IV SOLN
500.0000 [IU] | Freq: Once | INTRAVENOUS | Status: AC | PRN
  Administered 2020-01-28: 500 [IU]
  Filled 2020-01-28: qty 5

## 2020-01-28 MED ORDER — SODIUM CHLORIDE 0.9 % IV SOLN
360.0000 mg | Freq: Once | INTRAVENOUS | Status: AC
  Administered 2020-01-28: 360 mg via INTRAVENOUS
  Filled 2020-01-28: qty 24

## 2020-01-28 NOTE — Progress Notes (Signed)
[place] [phone]   [phone]  OFFICE PROGRESS NOTE  [name], [place] [address]  DIAGNOSIS: stage IV (T3, N2, M1 a) non-small cell lung cancer, squamous cell carcinoma presented with obstructive left lower lobe lung mass in addition to mediastinal lymphadenopathy as well as bilateral pulmonary nodules diagnosed in July 2021.  PDL1: 0%  PRIOR THERAPY: None  CURRENT THERAPY: 1) Palliative radiotherapy to the obstructive left lower lobe lung mass under the care of Dr. Lisbeth Renshaw.  2)  systemic chemotherapy 2 cycles of chemotherapy with carboplatin for an AUC of 5 and paclitaxel 175 mg/m2 in addition to immunotherapy with nivolumab 360 mg every 3 weeks and ipilimumab 1 mg/kg IV every 6 weeks followed by maintenance nivolumab and ipilimumab.  He started the first treatment on 09/04/2019.  He is status post 3 cycles of the treatment.  INTERVAL HISTORY: Shaun Kennedy 79 y.o. male returns to the clinic today for follow-up visit.  The patient is feeling fine today with no concerning complaints except for the baseline shortness of breath increased with exertion.  He was seen by Dr. Redmond Baseman for evaluation of stridor and he discussed with him several options including tracheostomy.  He denied having any current chest pain, cough or hemoptysis.  He denied having any fever or chills.  He has no nausea, vomiting, diarrhea or constipation.  He denied having any headache or visual changes.  He continues to tolerate his treatment with immunotherapy fairly well.  The patient is here today for evaluation before starting day #22 of cycle #4.  MEDICAL HISTORY: Past Medical History:  Diagnosis Date  . Allergic rhinitis   . Asthma   . Carotid stenosis   . Colon cancer ([facility]) 2003  . Diabetes mellitus ([place])   . Diverticulosis   . Dyslipidemia   . ED (erectile dysfunction)   . GERD (gastroesophageal reflux disease)   . H/O  degenerative disc disease   . Hemorrhoids   . HTN (hypertension)   . Hyperlipidemia   . Lung cancer ([facility])   . LVH (left ventricular hypertrophy)    on EKG  . Mass of lower lobe of left lung   . Mediastinal adenopathy   . Smoker    former  . Wears dentures   . Wears dentures   . Wears glasses   . Wears glasses     ALLERGIES:  has No Known Allergies.  MEDICATIONS:  Current Outpatient Medications  Medication Sig Dispense Refill  . amLODipine (NORVASC) 5 MG tablet TAKE 1 TABLET BY MOUTH EVERY DAY (Patient taking differently: Take 5 mg by mouth daily.) 90 tablet 3  . aspirin EC 81 MG tablet Take 81 mg by mouth daily after breakfast.     . Blood Glucose Monitoring Suppl (ONETOUCH VERIO) w/Device KIT USE TO CHECK SUGAR DAILY 1 kit 0  . Blood Glucose Monitoring Suppl (ONETOUCH VERIO) w/Device KIT USE TO CHECK SUGAR DAILY 1 kit 0  . cholecalciferol (VITAMIN D) 1000 units tablet Take 1,000 Units by mouth daily after breakfast.     . cilostazol (PLETAL) 100 MG tablet Take 100 mg by mouth 2 (two) times daily.    Marland Kitchen dronabinol (MARINOL) 2.5 MG capsule TAKE 1 CAPSULE BY MOUTH 2 TIMES DAILY BEFORE A MEAL. 30 capsule 1  . ferrous sulfate 325 (65 FE) MG EC tablet Take 1 tablet (325 mg total) by mouth daily with breakfast. 30 tablet 12  . gabapentin (NEURONTIN)  100 MG capsule Take 1 capsule (100 mg total) by mouth 3 (three) times daily. 90 capsule 1  . guaiFENesin-codeine 100-10 MG/5ML syrup TAKE 5ML BY MOUTH 3 TIMES A DAY AS NEEDED FOR COUGH (Patient not taking: Reported on 01/27/2020) 240 mL 0  . Lancets (ONETOUCH DELICA PLUS [id number]) MISC PATIENT IS TO TEST TWO TIMES A DAY DX: E11.9 (ONETOUCH VERIO FLEX) 100 each 12  . lidocaine-prilocaine (EMLA) cream Apply to the Port-A-Cath site 30-60-minute before chemotherapy. (Patient taking differently: Apply 1 application topically daily as needed (port access). Apply to the Port-A-Cath site 30-60-minute before chemotherapy.) 30 g 0  . LINZESS 72 MCG  capsule Take 72 mcg by mouth every morning.    Marland Kitchen lisinopril-hydrochlorothiazide (ZESTORETIC) 20-12.5 MG tablet TAKE 1 TABLET BY MOUTH EVERY DAY (Patient taking differently: Take 1 tablet by mouth daily.) 90 tablet 2  . metFORMIN (GLUCOPHAGE-XR) 500 MG 24 hr tablet TAKE 1 TABLET BY MOUTH EVERY DAY 90 tablet 1  . mirtazapine (REMERON) 15 MG tablet TAKE 1 TABLET BY MOUTH EVERYDAY AT BEDTIME 90 tablet 1  . Multiple Vitamin (MULTIVITAMIN WITH MINERALS) TABS tablet Take 1 tablet by mouth daily after breakfast.     . ONETOUCH VERIO test strip USE AS INSTRUCTED TO CHECK TWICE DAILY 200 strip 5  . simvastatin (ZOCOR) 40 MG tablet TAKE 1 TABLET BY MOUTH EVERY DAY 90 tablet 3  . sucralfate (CARAFATE) 1 GM/10ML suspension TAKE 10 MLS BY MOUTH 4 TIMES DAILY - WITH MEALS AND AT BEDTIME. (Patient taking differently: Take 1 g by mouth 4 (four) times daily -  with meals and at bedtime.) 420 mL 0   No current facility-administered medications for this visit.   Facility-Administered Medications Ordered in Other Visits  Medication Dose Route Frequency Provider Last Rate Last Admin  . sodium chloride flush (NS) 0.9 % injection 10 mL  10 mL Intracatheter PRN Curt Bears, MD   10 mL at 09/04/19 1556    SURGICAL HISTORY:  Past Surgical History:  Procedure Laterality Date  . BRONCHIAL BIOPSY  08/20/2019   Procedure: BRONCHIAL BIOPSIES;  Surgeon: Collene Gobble, MD;  Location: [facility] ENDOSCOPY;  Service: Pulmonary;;  . BRONCHIAL BRUSHINGS  08/20/2019   Procedure: BRONCHIAL BRUSHINGS;  Surgeon: Collene Gobble, MD;  Location: [facility] ENDOSCOPY;  Service: Pulmonary;;  . BRONCHIAL NEEDLE ASPIRATION BIOPSY  08/20/2019   Procedure: BRONCHIAL NEEDLE ASPIRATION BIOPSIES;  Surgeon: Collene Gobble, MD;  Location: [facility] ENDOSCOPY;  Service: Pulmonary;;  . CARPAL TUNNEL RELEASE Right 01/09/2019   Procedure: CARPAL TUNNEL RELEASE;  Surgeon: Leandrew Koyanagi, MD;  Location: [place];  Service: Orthopedics;  Laterality:  Right;  . CATARACT EXTRACTION Right 2018  . COLONOSCOPY  2007   Dr. Benson Norway  . I & D EXTREMITY Right 04/02/2016   Procedure: IRRIGATION AND DEBRIDEMENT GREAT TOE;  Surgeon: Leandrew Koyanagi, MD;  Location: [place];  Service: Orthopedics;  Laterality: Right;  . IR IMAGING GUIDED PORT INSERTION  08/30/2019  . MULTIPLE TOOTH EXTRACTIONS    . RADIOACTIVE SEED IMPLANT  2003  . ULNAR TUNNEL RELEASE Right 01/09/2019   Procedure: RIGHT CUBITAL TUNNEL RELEASE AND CARPAL TUNNEL RELEASE;  Surgeon: Leandrew Koyanagi, MD;  Location: [facility];  Service: Orthopedics;  Laterality: Right;  . UPPER GASTROINTESTINAL ENDOSCOPY    . VIDEO BRONCHOSCOPY N/A 12/18/2019   Procedure: VIDEO BRONCHOSCOPY WITHOUT FLUORO;  Surgeon: Collene Gobble, MD;  Location: [facility] ENDOSCOPY;  Service: Cardiopulmonary;  Laterality: N/A;  . VIDEO BRONCHOSCOPY  WITH ENDOBRONCHIAL ULTRASOUND N/A 08/20/2019   Procedure: VIDEO BRONCHOSCOPY WITH ENDOBRONCHIAL ULTRASOUND;  Surgeon: Collene Gobble, MD;  Location: [facility] ENDOSCOPY;  Service: Pulmonary;  Laterality: N/A;    REVIEW OF SYSTEMS:  A comprehensive review of systems was negative except for: Constitutional: positive for fatigue Ears, nose, mouth, throat, and face: positive for hoarseness Respiratory: positive for dyspnea on exertion   PHYSICAL EXAMINATION: General appearance: alert, cooperative, fatigued and no distress Head: Normocephalic, without obvious abnormality, atraumatic Neck: no adenopathy, no JVD, supple, symmetrical, trachea midline and thyroid not enlarged, symmetric, no tenderness/mass/nodules Lymph nodes: Cervical, supraclavicular, and axillary nodes normal. Resp: clear to auscultation bilaterally Back: symmetric, no curvature. ROM normal. No CVA tenderness. Cardio: regular rate and rhythm, S1, S2 normal, no murmur, click, rub or gallop GI: soft, non-tender; bowel sounds normal; no masses,  no organomegaly Extremities: extremities normal, atraumatic, no cyanosis or  edema  ECOG PERFORMANCE STATUS: 1 - Symptomatic but completely ambulatory  Blood pressure (!) 143/75, pulse 77, temperature (!) 97.5 F (36.4 C), temperature source Tympanic, resp. rate 18, height '5\' 7"'  (1.702 m), weight 171 lb 14.4 oz (78 kg), SpO2 97 %.  LABORATORY DATA: Lab Results  Component Value Date   WBC 5.8 01/28/2020   HGB 9.7 (L) 01/28/2020   HCT 29.3 (L) 01/28/2020   MCV 93.0 01/28/2020   PLT 259 01/28/2020      Chemistry      Component Value Date/Time   NA 140 01/28/2020 0915   NA 132 (L) 08/14/2019 0918   K 3.7 01/28/2020 0915   CL 107 01/28/2020 0915   CO2 27 01/28/2020 0915   BUN 15 01/28/2020 0915   BUN 16 08/14/2019 0918   CREATININE 0.97 01/28/2020 0915   CREATININE 1.25 (H) 08/08/2016 0912      Component Value Date/Time   CALCIUM 9.0 01/28/2020 0915   ALKPHOS 57 01/28/2020 0915   AST 16 01/28/2020 0915   ALT 15 01/28/2020 0915   BILITOT 0.6 01/28/2020 0915       RADIOGRAPHIC STUDIES: No results found.  ASSESSMENT AND PLAN: This is a very pleasant 79 years old [name] with stage IV non-small cell lung cancer, squamous cell carcinoma with negative PD-L1 expression. The patient is currently undergoing systemic chemotherapy with carboplatin and paclitaxel for 2 cycles in addition to immunotherapy with ipilimumab 1 mg/KG every 6 weeks and nivolumab 360 mg IV every 3 weeks status post 3 cycles.  He is here today for day #22 of cycle #4.  The patient continues to tolerate his treatment well with no concerning adverse effects. I recommended for him to proceed with his treatment today as planned. I will see him back for follow-up visit in 3 weeks for evaluation before starting day #1 of cycle #5. The patient was advised to call immediately if he has any concerning symptoms in the interval. The patient voices understanding of current disease status and treatment options and is in agreement with the current care plan.  All questions were  answered. The patient knows to call the clinic with any problems, questions or concerns. We can certainly see the patient much sooner if necessary.  Disclaimer: This note was dictated with voice recognition software. Similar sounding words can inadvertently be transcribed and may not be corrected upon review.

## 2020-01-28 NOTE — Patient Instructions (Signed)
[facility] Discharge Instructions for Patients Receiving Chemotherapy  Today you received the following chemotherapy agents Opdivo  To help prevent nausea and vomiting after your treatment, we encourage you to take your nausea medication as directed   If you develop nausea and vomiting that is not controlled by your nausea medication, call the clinic.   BELOW ARE SYMPTOMS THAT SHOULD BE REPORTED IMMEDIATELY:  *FEVER GREATER THAN 100.5 F  *CHILLS WITH OR WITHOUT FEVER  NAUSEA AND VOMITING THAT IS NOT CONTROLLED WITH YOUR NAUSEA MEDICATION  *UNUSUAL SHORTNESS OF BREATH  *UNUSUAL BRUISING OR BLEEDING  TENDERNESS IN MOUTH AND THROAT WITH OR WITHOUT PRESENCE OF ULCERS  *URINARY PROBLEMS  *BOWEL PROBLEMS  UNUSUAL RASH Items with * indicate a potential emergency and should be followed up as soon as possible.  Feel free to call the clinic should you have any questions or concerns. The clinic phone number is [phone].  Please show the CHEMO ALERT CARD at check-in to the Emergency Department and triage nurse.   

## 2020-01-28 NOTE — Patient Instructions (Signed)

## 2020-01-29 ENCOUNTER — Other Ambulatory Visit: Payer: Self-pay | Admitting: Pulmonary Disease

## 2020-02-03 ENCOUNTER — Telehealth: Payer: Self-pay | Admitting: Internal Medicine

## 2020-02-03 NOTE — Telephone Encounter (Signed)
Scheduled per 12/21 los. Pt will receive an updated appt calendar per next visit appt notes

## 2020-02-12 ENCOUNTER — Encounter: Payer: Self-pay | Admitting: Family Medicine

## 2020-02-12 ENCOUNTER — Ambulatory Visit (INDEPENDENT_AMBULATORY_CARE_PROVIDER_SITE_OTHER): Payer: Medicare HMO | Admitting: Family Medicine

## 2020-02-12 ENCOUNTER — Other Ambulatory Visit: Payer: Self-pay

## 2020-02-12 VITALS — Wt 169.6 lb

## 2020-02-12 DIAGNOSIS — E119 Type 2 diabetes mellitus without complications: Secondary | ICD-10-CM

## 2020-02-12 DIAGNOSIS — I152 Hypertension secondary to endocrine disorders: Secondary | ICD-10-CM

## 2020-02-12 DIAGNOSIS — C3432 Malignant neoplasm of lower lobe, left bronchus or lung: Secondary | ICD-10-CM

## 2020-02-12 DIAGNOSIS — J383 Other diseases of vocal cords: Secondary | ICD-10-CM

## 2020-02-12 DIAGNOSIS — T451X5A Adverse effect of antineoplastic and immunosuppressive drugs, initial encounter: Secondary | ICD-10-CM | POA: Insufficient documentation

## 2020-02-12 DIAGNOSIS — E1159 Type 2 diabetes mellitus with other circulatory complications: Secondary | ICD-10-CM

## 2020-02-12 DIAGNOSIS — G620 Drug-induced polyneuropathy: Secondary | ICD-10-CM | POA: Diagnosis not present

## 2020-02-12 LAB — POCT GLYCOSYLATED HEMOGLOBIN (HGB A1C): Hemoglobin A1C: 5.9 % — AB (ref 4.0–5.6)

## 2020-02-12 NOTE — Progress Notes (Signed)
  Subjective:    Patient ID: Shaun Kennedy, male    DOB: 07/02/1940, 80 y.o.   MRN: [id number]  [name] is a 80 y.o. male who presents for follow-up of Type 2 diabetes mellitus.  Home blood sugar records: meter records, fasting and post meal, 82-112 Current symptoms/problems include none at this time. Daily foot checks:yes   Any foot concerns: none Exercise: not active due to SOB Diet: fair He continues to be followed by oncology.  He has had radiation as well as chemotherapy.  His main concern now is stridor.  He was seen by pulmonology because of that and a bronchoscopy did show vocal cord dysfunction.  He is being considered for tracheostomy although he is not sure he really wants that done.  He continues on his blood pressure medications.  He is also taking Metformin.  He is now taking gabapentin for neuropathy. The following portions of the patient's history were reviewed and updated as appropriate: allergies, current medications, past medical history, past social history and problem list.  ROS as in subjective above.     Objective:    Physical Exam Alert and in no distress .  Respiratory stridor noted. Hemoglobin A1c is 5.9.    Lab Review Diabetic Labs Latest Ref Rng & Units 01/28/2020 01/08/2020 12/18/2019 11/27/2019 11/05/2019  HbA1c 4.0 - 5.6 % - - - - -  Microalbumin mg/L - - - - -  Micro/Creat Ratio - - - - - -  Chol 100 - 199 mg/dL - - - - -  HDL >39 mg/dL - - - - -  Calc LDL 0 - 99 mg/dL - - - - -  Triglycerides 0 - 149 mg/dL - - - - -  Creatinine 0.61 - 1.24 mg/dL 0.97 0.98 1.27(H) 0.88 1.20   BP/Weight 01/28/2020 01/27/2020 01/08/2020 12/18/2019 78/93/8101  Systolic BP 751 025 852 778 242  Diastolic BP 75 76 75 71 82  Wt. (Lbs) 171.9 172.2 166.6 169.2 169  BMI 26.92 26.97 26.09 26.11 26.08   Foot/eye exam completion dates Latest Ref Rng & Units 01/24/2019 01/24/2019  Eye Exam No Retinopathy Retinopathy(A) No Retinopathy  Foot Form Completion - - -     [place]  reports that he quit smoking about 32 years ago. He started smoking about 65 years ago. He has a 32.00 pack-year smoking history. He has never used smokeless tobacco. He reports that he does not drink alcohol and does not use drugs.     Assessment & Plan:    Type 2 diabetes mellitus without complication, without long-term current use of insulin (HCC) - Plan: POCT glycosylated hemoglobin (Hb A1C)  Vocal cord dysfunction  Chemotherapy-induced neuropathy (HCC)  Malignant neoplasm of lower lobe of left lung (HCC)  Hypertension associated with diabetes ([place])   1. Rx changes: none 2. Education: Reviewed 'ABCs' of diabetes management (respective goals in parentheses):  A1C (<7), blood pressure (<130/80), and cholesterol (LDL <100). 3. Compliance at present is estimated to be good. Efforts to improve compliance (if necessary) will be directed at No change. 4. Follow up: 4 months Will consider medication adjustment with his next visit as he might not need his Metformin anymore.  Go ahead and refer to get a 2nd opinion concerning the tracheostomy.

## 2020-02-14 ENCOUNTER — Telehealth: Payer: Self-pay | Admitting: Family Medicine

## 2020-02-14 ENCOUNTER — Ambulatory Visit: Payer: Medicare HMO | Admitting: Family Medicine

## 2020-02-14 NOTE — Progress Notes (Signed)
Shaun Kennedy  Shaun Kennedy, [place] Shaun Kennedy [place] [address]  DIAGNOSIS: Stage IV (T3, N2, M1 a) non-small cell Kennedy cancer, squamous cell carcinoma presented with obstructive left lower lobe Kennedy mass in addition to mediastinal lymphadenopathy as well as bilateral pulmonary nodules diagnosed in July 2021.  PDL1:0%  PRIOR THERAPY: Palliative radiotherapy to the obstructive left lower lobe Kennedy mass under the care of Dr. Lisbeth Renshaw. Completed on 09/20/19  CURRENT THERAPY: Systemic chemotherapy 2 cycles of chemotherapy with carboplatin for an AUC of5and paclitaxel175 mg/m2in addition to immunotherapy with nivolumab360 mgevery 3 weeks and ipilimumab1 mg/kgIV every 6 weeksfollowed by maintenance nivolumab and ipilimumab.He started the first treatment on 09/04/2019. He is status post Day 21 cycle #4of the treatment.  INTERVAL HISTORY: Shaun Kennedy 80 y.o. male returns to the clinic today for a follow-up visit.  The patient is feeling well today without any concerning complaints. He saw ENT Dr. Lucia Gaskins and Dr. Redmond Baseman for stidor secondary to poor vocal cord movement. His most recent appointment was yesterday. They discussed possible tracheostomy. However, they are arranging for a second opinion at [facility] to see what other surgical options are available for this patient. Otherwise, the patient denies any recent fever, chills, night sweats, or recent weight loss.  The patient denies any nausea, vomiting, or diarrhea. He has occasional constipation and takes colace twice a day.  He denies any chest pain, cough, or hemoptysis. He reports baseline shortness of breath with exertion which is unchanged. He denies any headache or visual changes.  He denies any rashes or skin changes.  He is here today for evaluation  before starting day 1 cycle #5.  MEDICAL HISTORY: Past Medical History:  Diagnosis Date   Allergic rhinitis    Asthma     Carotid stenosis    Colon cancer ([place]) 2003   Diabetes mellitus ([place])    Diverticulosis    Dyslipidemia    ED (erectile dysfunction)    GERD (gastroesophageal reflux disease)    H/O degenerative disc disease    Hemorrhoids    HTN (hypertension)    Hyperlipidemia    Kennedy cancer (HCC)    LVH (left ventricular hypertrophy)    on EKG   Mass of lower lobe of left Kennedy    Mediastinal adenopathy    Smoker    former   Wears dentures    Wears dentures    Wears glasses    Wears glasses     ALLERGIES:  has No Known Allergies.  MEDICATIONS:  Current Outpatient Medications  Medication Sig Dispense Refill   amLODipine (NORVASC) 5 MG tablet TAKE 1 TABLET BY MOUTH EVERY DAY (Patient taking differently: Take 5 mg by mouth daily.) 90 tablet 3   aspirin EC 81 MG tablet Take 81 mg by mouth daily after breakfast.      Blood Glucose Monitoring Suppl (ONETOUCH VERIO) w/Device KIT USE TO CHECK SUGAR DAILY 1 kit 0   Blood Glucose Monitoring Suppl (ONETOUCH VERIO) w/Device KIT USE TO CHECK SUGAR DAILY 1 kit 0   cholecalciferol (VITAMIN D) 1000 units tablet Take 1,000 Units by mouth daily after breakfast.      cilostazol (PLETAL) 100 MG tablet Take 100 mg by mouth 2 (two) times daily.     dronabinol (MARINOL) 2.5 MG capsule TAKE 1 CAPSULE BY MOUTH 2 TIMES DAILY BEFORE A MEAL. 30 capsule 1   ferrous sulfate 325 (65 FE) MG EC tablet Take 1 tablet (325  mg total) by mouth daily with breakfast. 30 tablet 12   gabapentin (NEURONTIN) 100 MG capsule Take 1 capsule (100 mg total) by mouth 3 (three) times daily. 90 capsule 1   Lancets (ONETOUCH DELICA PLUS [id number]) MISC PATIENT IS TO TEST TWO TIMES A DAY DX: E11.9 (ONETOUCH VERIO FLEX) 100 each 12   LINZESS 72 MCG capsule Take 72 mcg by mouth every morning.     lisinopril-hydrochlorothiazide (ZESTORETIC) 20-12.5 MG tablet TAKE 1 TABLET BY MOUTH EVERY DAY (Patient taking differently: Take 1 tablet by mouth daily.) 90 tablet 2    metFORMIN (GLUCOPHAGE-XR) 500 MG 24 hr tablet TAKE 1 TABLET BY MOUTH EVERY DAY 90 tablet 1   mirtazapine (REMERON) 15 MG tablet TAKE 1 TABLET BY MOUTH EVERYDAY AT BEDTIME 90 tablet 1   Multiple Vitamin (MULTIVITAMIN WITH MINERALS) TABS tablet Take 1 tablet by mouth daily after breakfast.      ONETOUCH VERIO test strip USE AS INSTRUCTED TO CHECK TWICE DAILY 200 strip 5   simvastatin (ZOCOR) 40 MG tablet TAKE 1 TABLET BY MOUTH EVERY DAY 90 tablet 3   sucralfate (CARAFATE) 1 GM/10ML suspension TAKE 10 MLS BY MOUTH 4 TIMES DAILY - WITH MEALS AND AT BEDTIME. (Patient taking differently: Take 1 g by mouth 4 (four) times daily -  with meals and at bedtime.) 420 mL 0   guaiFENesin-codeine 100-10 MG/5ML syrup TAKE 5ML BY MOUTH 3 TIMES A DAY AS NEEDED FOR COUGH (Patient not taking: No sig reported) 240 mL 0   lidocaine-prilocaine (EMLA) cream Apply to the Port-A-Cath site 30-60-minute before chemotherapy. (Patient not taking: No sig reported) 30 g 0   No current facility-administered medications for this visit.   Facility-Administered Medications Ordered in Other Visits  Medication Dose Route Frequency Provider Last Rate Last Admin   sodium chloride flush (NS) 0.9 % injection 10 mL  10 mL Intracatheter PRN Curt Bears, MD   10 mL at 09/04/19 1556    SURGICAL HISTORY:  Past Surgical History:  Procedure Laterality Date   BRONCHIAL BIOPSY  08/20/2019   Procedure: BRONCHIAL BIOPSIES;  Surgeon: Collene Gobble, MD;  Location: [facility] ENDOSCOPY;  Service: Pulmonary;;   BRONCHIAL BRUSHINGS  08/20/2019   Procedure: BRONCHIAL BRUSHINGS;  Surgeon: Collene Gobble, MD;  Location: [facility] ENDOSCOPY;  Service: Pulmonary;;   BRONCHIAL NEEDLE ASPIRATION BIOPSY  08/20/2019   Procedure: BRONCHIAL NEEDLE ASPIRATION BIOPSIES;  Surgeon: Collene Gobble, MD;  Location: [place];  Service: Pulmonary;;   CARPAL TUNNEL RELEASE Right 01/09/2019   Procedure: CARPAL TUNNEL RELEASE;  Surgeon: Leandrew Koyanagi, MD;   Location: [place];  Service: Orthopedics;  Laterality: Right;   CATARACT EXTRACTION Right 2018   COLONOSCOPY  2007   Dr. Benson Norway   I & D EXTREMITY Right 04/02/2016   Procedure: IRRIGATION AND DEBRIDEMENT GREAT TOE;  Surgeon: Leandrew Koyanagi, MD;  Location: [place];  Service: Orthopedics;  Laterality: Right;   IR IMAGING GUIDED PORT INSERTION  08/30/2019   MULTIPLE TOOTH EXTRACTIONS     RADIOACTIVE SEED IMPLANT  2003   ULNAR TUNNEL RELEASE Right 01/09/2019   Procedure: RIGHT CUBITAL TUNNEL RELEASE AND CARPAL TUNNEL RELEASE;  Surgeon: Leandrew Koyanagi, MD;  Location: [place];  Service: Orthopedics;  Laterality: Right;   UPPER GASTROINTESTINAL ENDOSCOPY     VIDEO BRONCHOSCOPY N/A 12/18/2019   Procedure: VIDEO BRONCHOSCOPY WITHOUT FLUORO;  Surgeon: Collene Gobble, MD;  Location: [facility] ENDOSCOPY;  Service: Cardiopulmonary;  Laterality: N/A;   VIDEO BRONCHOSCOPY  WITH ENDOBRONCHIAL ULTRASOUND N/A 08/20/2019   Procedure: VIDEO BRONCHOSCOPY WITH ENDOBRONCHIAL ULTRASOUND;  Surgeon: Collene Gobble, MD;  Location: [facility] ENDOSCOPY;  Service: Pulmonary;  Laterality: N/A;    REVIEW OF SYSTEMS:   Review of Systems  Constitutional: Negative for appetite change, chills, fatigue, fever and unexpected weight change.  HENT: Negative for mouth sores, nosebleeds, sore throat and trouble swallowing.  Positive for stidor.  Eyes: Negative for eye problems and icterus.  Respiratory: Positive for shortness of breath (unchanged). Negative for cough, hemoptysis, and wheezing.   Cardiovascular: Negative for chest pain and leg swelling.  Gastrointestinal: Positive for constipation. Negative for abdominal pain, diarrhea, nausea and vomiting.  Genitourinary: Negative for bladder incontinence, difficulty urinating, dysuria, frequency and hematuria.   Musculoskeletal: Negative for back pain, gait problem, neck pain and neck stiffness.  Skin: Negative for itching and rash.  Neurological: Negative  for dizziness, extremity weakness, gait problem, headaches, light-headedness and seizures.  Hematological: Negative for adenopathy. Does not bruise/bleed easily.  Psychiatric/Behavioral: Negative for confusion, depression and sleep disturbance. The patient is not nervous/anxious.     PHYSICAL EXAMINATION:  Blood pressure (!) 156/77, pulse 75, temperature (!) 97.2 F (36.2 C), temperature source Tympanic, resp. rate 17, height 5' 7" (1.702 m), weight 172 lb 3.2 oz (78.1 kg), SpO2 97 %.  ECOG PERFORMANCE STATUS: 1 - Symptomatic but completely ambulatory  Physical Exam  Constitutional: Oriented to person, place, and time and well-developed, well-nourished, and in no distress.  HENT:  Head: Normocephalic and atraumatic.  Mouth/Throat: Oropharynx is clear and moist. No oropharyngeal exudate.  Eyes: Conjunctivae are normal. Right eye exhibits no discharge. Left eye exhibits no discharge. No scleral icterus.  Neck: Normal range of motion. Neck supple.  Cardiovascular: Normal rate, regular rhythm, normal heart sounds and intact distal pulses.   Pulmonary/Chest: Effort normal and breath sounds normal. No respiratory distress. No wheezes. No rales. Positive for stridor with inhalation.  Abdominal: Soft. Bowel sounds are normal. Exhibits no distension and no mass. There is no tenderness.  Musculoskeletal: Normal range of motion. Exhibits no edema.  Lymphadenopathy:    No cervical adenopathy.  Neurological: Alert and oriented to person, place, and time. Exhibits normal muscle tone. Gait normal. Coordination normal.  Skin: Skin is warm and dry. No rash noted. Not diaphoretic. No erythema. No pallor.  Psychiatric: Mood, memory and judgment normal.  Vitals reviewed.  LABORATORY DATA: Lab Results  Component Value Date   WBC 4.0 02/19/2020   HGB 10.4 (L) 02/19/2020   HCT 31.5 (L) 02/19/2020   MCV 92.1 02/19/2020   PLT 268 02/19/2020      Chemistry      Component Value Date/Time   NA 139  02/19/2020 0840   NA 132 (L) 08/14/2019 0918   K 3.9 02/19/2020 0840   CL 105 02/19/2020 0840   CO2 27 02/19/2020 0840   BUN 15 02/19/2020 0840   BUN 16 08/14/2019 0918   CREATININE 1.06 02/19/2020 0840   CREATININE 1.25 (H) 08/08/2016 0912      Component Value Date/Time   CALCIUM 9.3 02/19/2020 0840   ALKPHOS 49 02/19/2020 0840   AST 19 02/19/2020 0840   ALT 12 02/19/2020 0840   BILITOT 0.7 02/19/2020 0840       RADIOGRAPHIC STUDIES:  No results found.   ASSESSMENT/PLAN:  This is a very pleasant 80 year old [name] diagnosed with stage IV (T3, N2, M1a) non-small cell Kennedy cancer, squamous cell carcinoma. He presented with a left lower lobe obstructive Kennedy mass  in addition to mediastinal lymphadenopathy. He also presented with bilateral pulmonary nodules. He was diagnosed in July 2021. His PD-L1 expression is negative.  He completed palliative radiotherapy to the obstructive Kennedy mass under the care of Dr. Lisbeth Renshaw in August 2021.  The patient is currently undergoing systemic chemotherapy with carboplatin and paclitaxel for 2 cycles in addition to immunotherapy with ipilimumab 1 mg/KG every 6 weeks and nivolumab 360 mg IV every 3 weeks.  He is status post day 21 of cycle 4.   Labs were reviewed. Recommend that he proceed with cycle 5 day 1 today as scheduled.   I will arrange for a restaging CT scan of the chest. Since his disease is in the chest, we will not order a CT ab/pelvis this time.   We will see him back for a follow up visit in 3 weeks for evaluation before starting day 21 Cycle 5.   He will continue to follow with ENT regarding his stidor and possible interventions. Patient's oxygen is 97% on room air. Breathing comfortably at rest.   The patient was advised to call immediately if he has any concerning symptoms in the interval. The patient voices understanding of current disease status and treatment options and is in agreement with the current  care plan. All questions were answered. The patient knows to call the clinic with any problems, questions or concerns. We can certainly see the patient much sooner if necessary       Orders Placed This Encounter  Procedures   CT Chest W Contrast    Standing Status:   Future    Standing Expiration Date:   02/18/2021    Order Specific Question:   If indicated for the ordered procedure, I authorize the administration of contrast media per Radiology protocol    Answer:   Yes    Order Specific Question:   Preferred imaging location?    Answer:   [facility]   CT Abdomen Pelvis W Contrast    Standing Status:   Future    Standing Expiration Date:   02/18/2021    Order Specific Question:   If indicated for the ordered procedure, I authorize the administration of contrast media per Radiology protocol    Answer:   Yes    Order Specific Question:   Preferred imaging location?    Answer:   [facility]    Order Specific Question:   Is Oral Contrast requested for this exam?    Answer:   Yes, Per Radiology protocol   CBC with Differential ([place] Only)    Standing Status:   Standing    Number of Occurrences:   12    Standing Expiration Date:   02/18/2021   CMP ([place] only)    Standing Status:   Standing    Number of Occurrences:   12    Standing Expiration Date:   02/18/2021     I spent 20-29 minutes for this encounter.   Avika Carbine L Torunn Chancellor, PA-C 02/19/20

## 2020-02-14 NOTE — Telephone Encounter (Signed)
Patient called to check on status of ENT referral  Please call

## 2020-02-17 ENCOUNTER — Encounter (INDEPENDENT_AMBULATORY_CARE_PROVIDER_SITE_OTHER): Payer: Self-pay | Admitting: Otolaryngology

## 2020-02-17 ENCOUNTER — Other Ambulatory Visit: Payer: Self-pay

## 2020-02-17 ENCOUNTER — Ambulatory Visit (INDEPENDENT_AMBULATORY_CARE_PROVIDER_SITE_OTHER): Payer: Medicare HMO | Admitting: Otolaryngology

## 2020-02-17 VITALS — Temp 95.7°F

## 2020-02-17 DIAGNOSIS — J3802 Paralysis of vocal cords and larynx, bilateral: Secondary | ICD-10-CM | POA: Diagnosis not present

## 2020-02-17 NOTE — Progress Notes (Signed)
HPI: Shaun Kennedy is a 80 y.o. male who presents is referred by his PCP Dr. Redmond School for evaluation of difficulty breathing.  He has history of lung cancer that is followed by Dr. Julien Nordmann.  He saw Dr. Redmond Baseman on referral 3 weeks ago and was noted to have poor vocal cord movement and he referred him to [facility] to discuss surgical options for this.  He presents here for second opinion.  He was not that interested in going to [facility]. His voice is doing well however he has stridor on inhalation.  He has no difficulty eating..  Past Medical History:  Diagnosis Date  . Allergic rhinitis   . Asthma   . Carotid stenosis   . Colon cancer ([place]) 2003  . Diabetes mellitus ([place])   . Diverticulosis   . Dyslipidemia   . ED (erectile dysfunction)   . GERD (gastroesophageal reflux disease)   . H/O degenerative disc disease   . Hemorrhoids   . HTN (hypertension)   . Hyperlipidemia   . Lung cancer ([place])   . LVH (left ventricular hypertrophy)    on EKG  . Mass of lower lobe of left lung   . Mediastinal adenopathy   . Smoker    former  . Wears dentures   . Wears dentures   . Wears glasses   . Wears glasses    Past Surgical History:  Procedure Laterality Date  . BRONCHIAL BIOPSY  08/20/2019   Procedure: BRONCHIAL BIOPSIES;  Surgeon: Collene Gobble, MD;  Location: [facility] ENDOSCOPY;  Service: Pulmonary;;  . BRONCHIAL BRUSHINGS  08/20/2019   Procedure: BRONCHIAL BRUSHINGS;  Surgeon: Collene Gobble, MD;  Location: [facility] ENDOSCOPY;  Service: Pulmonary;;  . BRONCHIAL NEEDLE ASPIRATION BIOPSY  08/20/2019   Procedure: BRONCHIAL NEEDLE ASPIRATION BIOPSIES;  Surgeon: Collene Gobble, MD;  Location: [facility] ENDOSCOPY;  Service: Pulmonary;;  . CARPAL TUNNEL RELEASE Right 01/09/2019   Procedure: CARPAL TUNNEL RELEASE;  Surgeon: Leandrew Koyanagi, MD;  Location: [place];  Service: Orthopedics;  Laterality: Right;  . CATARACT EXTRACTION Right 2018  . COLONOSCOPY  2007   Dr. Benson Norway  . I & D  EXTREMITY Right 04/02/2016   Procedure: IRRIGATION AND DEBRIDEMENT GREAT TOE;  Surgeon: Leandrew Koyanagi, MD;  Location: [place];  Service: Orthopedics;  Laterality: Right;  . IR IMAGING GUIDED PORT INSERTION  08/30/2019  . MULTIPLE TOOTH EXTRACTIONS    . RADIOACTIVE SEED IMPLANT  2003  . ULNAR TUNNEL RELEASE Right 01/09/2019   Procedure: RIGHT CUBITAL TUNNEL RELEASE AND CARPAL TUNNEL RELEASE;  Surgeon: Leandrew Koyanagi, MD;  Location: [place];  Service: Orthopedics;  Laterality: Right;  . UPPER GASTROINTESTINAL ENDOSCOPY    . VIDEO BRONCHOSCOPY N/A 12/18/2019   Procedure: VIDEO BRONCHOSCOPY WITHOUT FLUORO;  Surgeon: Collene Gobble, MD;  Location: [facility] ENDOSCOPY;  Service: Cardiopulmonary;  Laterality: N/A;  . VIDEO BRONCHOSCOPY WITH ENDOBRONCHIAL ULTRASOUND N/A 08/20/2019   Procedure: VIDEO BRONCHOSCOPY WITH ENDOBRONCHIAL ULTRASOUND;  Surgeon: Collene Gobble, MD;  Location: [facility] ENDOSCOPY;  Service: Pulmonary;  Laterality: N/A;   Social History   Socioeconomic History  . Marital status: Married    Spouse name: Not on file  . Number of children: 3  . Years of education: Not on file  . Highest education level: Not on file  Occupational History  . Occupation: retired  Tobacco Use  . Smoking status: Former Smoker    Packs/day: 1.00    Years: 32.00  Pack years: 32.00    Start date: 02/08/1955    Quit date: 10/09/1987    Years since quitting: 32.3  . Smokeless tobacco: Never Used  Vaping Use  . Vaping Use: Never used  Substance and Sexual Activity  . Alcohol use: No  . Drug use: No  . Sexual activity: Yes  Other Topics Concern  . Not on file  Social History Narrative   Married, no pets   Social Determinants of Health   Financial Resource Strain: Not on file  Food Insecurity: Not on file  Transportation Needs: Not on file  Physical Activity: Not on file  Stress: Not on file  Social Connections: Not on file   Family History  Problem Relation Age of Onset  . Heart  failure Mother   . Diabetes Mother   . Stroke Father   . Diabetes Father   . Diabetes Sister   . Diabetes Sister   . Lung cancer Sister 19  . Colon cancer Neg Hx   . Esophageal cancer Neg Hx   . Rectal cancer Neg Hx   . Colon polyps Neg Hx   . Stomach cancer Neg Hx    No Known Allergies Prior to Admission medications   Medication Sig Start Date End Date Taking? Authorizing Provider  amLODipine (NORVASC) 5 MG tablet TAKE 1 TABLET BY MOUTH EVERY DAY Patient taking differently: Take 5 mg by mouth daily. 10/02/19   [name], MD  aspirin EC 81 MG tablet Take 81 mg by mouth daily after breakfast.     [provider]  Blood Glucose Monitoring Suppl (ONETOUCH VERIO) w/Device KIT USE TO CHECK SUGAR DAILY 02/21/17   [name], MD  Blood Glucose Monitoring Suppl [facility] VERIO) w/Device KIT USE TO CHECK SUGAR DAILY 03/27/18   [name], MD  cholecalciferol (VITAMIN D) 1000 units tablet Take 1,000 Units by mouth daily after breakfast.     [provider]  cilostazol (PLETAL) 100 MG tablet Take 100 mg by mouth 2 (two) times daily.    [provider]  dronabinol (MARINOL) 2.5 MG capsule TAKE 1 CAPSULE BY MOUTH 2 TIMES DAILY BEFORE A MEAL. 12/26/19   [name], MD  ferrous sulfate 325 (65 FE) MG EC tablet Take 1 tablet (325 mg total) by mouth daily with breakfast. 09/17/19   Tanner, Lyndon Code., PA-C  gabapentin (NEURONTIN) 100 MG capsule Take 1 capsule (100 mg total) by mouth 3 (three) times daily. 12/03/19   Curt Bears, MD  guaiFENesin-codeine 100-10 MG/5ML syrup TAKE 5ML BY MOUTH 3 TIMES A DAY AS NEEDED FOR COUGH Patient not taking: No sig reported 10/21/19   Harle Stanford., PA-C  Lancets (ONETOUCH DELICA PLUS [id number]) MISC PATIENT IS TO TEST TWO TIMES A DAY DX: E11.9 (ONETOUCH VERIO FLEX) 04/23/19   [name], MD  lidocaine-prilocaine (EMLA) cream Apply to the Port-A-Cath site 30-60-minute before chemotherapy. Patient not taking: Reported on  02/12/2020 08/24/19   Curt Bears, MD  [facility] 72 MCG capsule Take 72 mcg by mouth every morning. 11/18/19   [provider]  lisinopril-hydrochlorothiazide (ZESTORETIC) 20-12.5 MG tablet TAKE 1 TABLET BY MOUTH EVERY DAY Patient taking differently: Take 1 tablet by mouth daily. 12/13/19   [name], MD  metFORMIN (GLUCOPHAGE-XR) 500 MG 24 hr tablet TAKE 1 TABLET BY MOUTH EVERY DAY 12/26/19   [name], MD  mirtazapine (REMERON) 15 MG tablet TAKE 1 TABLET BY MOUTH EVERYDAY AT BEDTIME 01/01/20   Heilingoetter,  Cassandra L, PA-C  Multiple Vitamin (MULTIVITAMIN WITH MINERALS) TABS tablet Take 1 tablet by mouth daily after breakfast.     [provider]  ONETOUCH VERIO test strip USE AS INSTRUCTED TO CHECK TWICE DAILY 10/07/19   [name], MD  simvastatin (ZOCOR) 40 MG tablet TAKE 1 TABLET BY MOUTH EVERY DAY 01/16/20   [name], MD  sucralfate (CARAFATE) 1 GM/10ML suspension TAKE 10 MLS BY MOUTH 4 TIMES DAILY - WITH MEALS AND AT BEDTIME. Patient taking differently: Take 1 g by mouth 4 (four) times daily -  with meals and at bedtime. 11/18/19   Curt Bears, MD     Positive ROS: Otherwise negative  All other systems have been reviewed and were otherwise negative with the exception of those mentioned in the HPI and as above.  Physical Exam: Constitutional: Alert, well-appearing, no acute distress Ears: External ears without lesions or tenderness. Ear canals are clear bilaterally with intact, clear TMs.  Nasal: External nose without lesions. Septum midline. Clear nasal passages bilaterally. Oral: Lips and gums without lesions. Tongue and palate mucosa without lesions. Posterior oropharynx clear. Fiberoptic laryngoscopy through the right nostril revealed clear nasopharynx.  Base of tongue vallecula and epiglottis were normal.  On evaluation of the vocal cords he had poor vocal cord mobility with very poor abduction of both vocal cords on inhalation.  He had  a limited glottic gap which caused the stridor on inhalation. Neck: No palpable adenopathy or masses.  No palpable masses or adenopathy in the neck or around the larynx.Marland Kitchen Respiratory: Breathing comfortably at rest but on deep inhalation has stridor. Skin: No facial/neck lesions or rash noted.  Laryngoscopy  Date/Time: 02/17/2020 1:34 PM Performed by: Rozetta Nunnery, MD Authorized by: Rozetta Nunnery, MD   Consent:    Consent obtained:  Verbal   Consent given by:  Patient Procedure details:    Indications: airway obstruction     Medication:  Afrin   Instrument: flexible fiberoptic laryngoscope     Scope location: right nare   Sinus:    Right nasopharynx: normal   Mouth:    Oropharynx: normal     Vallecula: normal     Epiglottis: normal   Throat:    True vocal cords: normal     reduced mobility   Comments:     Both vocal cords with poor mobility on inhalation with a limited glottic gap.    Assessment: Patient with bilateral vocal cord paresis with a limited glottic gap on inhalation.  Plan: I agree with Dr. Redmond Baseman and discussed this with the patient as well as his wife.  Would recommend referral to [facility] concerning possible surgical intervention on the vocal cords versus placement of a tracheostomy.  He had previously been discussed about this with Dr. Redmond Baseman and apparently has an appointment to see eye doctor at [place], [place]   CC:

## 2020-02-17 NOTE — Telephone Encounter (Signed)
Pt was seen today. Shaun Kennedy

## 2020-02-19 ENCOUNTER — Inpatient Hospital Stay: Payer: Medicare HMO

## 2020-02-19 ENCOUNTER — Inpatient Hospital Stay: Payer: Medicare HMO | Attending: Internal Medicine | Admitting: Physician Assistant

## 2020-02-19 ENCOUNTER — Other Ambulatory Visit: Payer: Self-pay

## 2020-02-19 VITALS — BP 156/77 | HR 75 | Temp 97.2°F | Resp 17 | Ht 67.0 in | Wt 172.2 lb

## 2020-02-19 DIAGNOSIS — Z7982 Long term (current) use of aspirin: Secondary | ICD-10-CM | POA: Diagnosis not present

## 2020-02-19 DIAGNOSIS — K219 Gastro-esophageal reflux disease without esophagitis: Secondary | ICD-10-CM | POA: Diagnosis not present

## 2020-02-19 DIAGNOSIS — E785 Hyperlipidemia, unspecified: Secondary | ICD-10-CM | POA: Diagnosis not present

## 2020-02-19 DIAGNOSIS — J45909 Unspecified asthma, uncomplicated: Secondary | ICD-10-CM | POA: Insufficient documentation

## 2020-02-19 DIAGNOSIS — K5900 Constipation, unspecified: Secondary | ICD-10-CM | POA: Diagnosis not present

## 2020-02-19 DIAGNOSIS — I10 Essential (primary) hypertension: Secondary | ICD-10-CM | POA: Insufficient documentation

## 2020-02-19 DIAGNOSIS — C3412 Malignant neoplasm of upper lobe, left bronchus or lung: Secondary | ICD-10-CM | POA: Diagnosis not present

## 2020-02-19 DIAGNOSIS — Z5112 Encounter for antineoplastic immunotherapy: Secondary | ICD-10-CM | POA: Insufficient documentation

## 2020-02-19 DIAGNOSIS — Z87891 Personal history of nicotine dependence: Secondary | ICD-10-CM | POA: Diagnosis not present

## 2020-02-19 DIAGNOSIS — Z85038 Personal history of other malignant neoplasm of large intestine: Secondary | ICD-10-CM | POA: Diagnosis not present

## 2020-02-19 DIAGNOSIS — C3432 Malignant neoplasm of lower lobe, left bronchus or lung: Secondary | ICD-10-CM

## 2020-02-19 DIAGNOSIS — Z923 Personal history of irradiation: Secondary | ICD-10-CM | POA: Insufficient documentation

## 2020-02-19 DIAGNOSIS — Z79899 Other long term (current) drug therapy: Secondary | ICD-10-CM | POA: Diagnosis not present

## 2020-02-19 DIAGNOSIS — E119 Type 2 diabetes mellitus without complications: Secondary | ICD-10-CM | POA: Diagnosis not present

## 2020-02-19 DIAGNOSIS — C3490 Malignant neoplasm of unspecified part of unspecified bronchus or lung: Secondary | ICD-10-CM

## 2020-02-19 DIAGNOSIS — Z7984 Long term (current) use of oral hypoglycemic drugs: Secondary | ICD-10-CM | POA: Diagnosis not present

## 2020-02-19 LAB — CBC WITH DIFFERENTIAL (CANCER CENTER ONLY)
Abs Immature Granulocytes: 0.01 10*3/uL (ref 0.00–0.07)
Basophils Absolute: 0 10*3/uL (ref 0.0–0.1)
Basophils Relative: 1 %
Eosinophils Absolute: 0.1 10*3/uL (ref 0.0–0.5)
Eosinophils Relative: 3 %
HCT: 31.5 % — ABNORMAL LOW (ref 39.0–52.0)
Hemoglobin: 10.4 g/dL — ABNORMAL LOW (ref 13.0–17.0)
Immature Granulocytes: 0 %
Lymphocytes Relative: 29 %
Lymphs Abs: 1.1 10*3/uL (ref 0.7–4.0)
MCH: 30.4 pg (ref 26.0–34.0)
MCHC: 33 g/dL (ref 30.0–36.0)
MCV: 92.1 fL (ref 80.0–100.0)
Monocytes Absolute: 0.5 10*3/uL (ref 0.1–1.0)
Monocytes Relative: 13 %
Neutro Abs: 2.2 10*3/uL (ref 1.7–7.7)
Neutrophils Relative %: 54 %
Platelet Count: 268 10*3/uL (ref 150–400)
RBC: 3.42 MIL/uL — ABNORMAL LOW (ref 4.22–5.81)
RDW: 13 % (ref 11.5–15.5)
WBC Count: 4 10*3/uL (ref 4.0–10.5)
nRBC: 0 % (ref 0.0–0.2)

## 2020-02-19 LAB — CMP (CANCER CENTER ONLY)
ALT: 12 U/L (ref 0–44)
AST: 19 U/L (ref 15–41)
Albumin: 3.4 g/dL — ABNORMAL LOW (ref 3.5–5.0)
Alkaline Phosphatase: 49 U/L (ref 38–126)
Anion gap: 7 (ref 5–15)
BUN: 15 mg/dL (ref 8–23)
CO2: 27 mmol/L (ref 22–32)
Calcium: 9.3 mg/dL (ref 8.9–10.3)
Chloride: 105 mmol/L (ref 98–111)
Creatinine: 1.06 mg/dL (ref 0.61–1.24)
GFR, Estimated: >60 mL/min (ref >60)
Glucose, Bld: 97 mg/dL (ref 70–99)
Potassium: 3.9 mmol/L (ref 3.5–5.1)
Sodium: 139 mmol/L (ref 135–145)
Total Bilirubin: 0.7 mg/dL (ref 0.3–1.2)
Total Protein: 7.9 g/dL (ref 6.5–8.1)

## 2020-02-19 LAB — TSH: TSH: 0.923 u[IU]/mL (ref 0.320–4.118)

## 2020-02-19 MED ORDER — SODIUM CHLORIDE 0.9% FLUSH
10.0000 mL | INTRAVENOUS | Status: DC | PRN
  Administered 2020-02-19: 10 mL
  Filled 2020-02-19: qty 10

## 2020-02-19 MED ORDER — DIPHENHYDRAMINE HCL 50 MG/ML IJ SOLN
25.0000 mg | Freq: Once | INTRAMUSCULAR | Status: AC
  Administered 2020-02-19: 25 mg via INTRAVENOUS

## 2020-02-19 MED ORDER — SODIUM CHLORIDE 0.9 % IV SOLN
360.0000 mg | Freq: Once | INTRAVENOUS | Status: AC
  Administered 2020-02-19: 360 mg via INTRAVENOUS
  Filled 2020-02-19: qty 24

## 2020-02-19 MED ORDER — DIPHENHYDRAMINE HCL 25 MG PO CAPS
ORAL_CAPSULE | ORAL | Status: AC
  Filled 2020-02-19: qty 2

## 2020-02-19 MED ORDER — DIPHENHYDRAMINE HCL 25 MG PO CAPS
ORAL_CAPSULE | ORAL | Status: AC
  Filled 2020-02-19: qty 1

## 2020-02-19 MED ORDER — FAMOTIDINE IN NACL 20-0.9 MG/50ML-% IV SOLN
INTRAVENOUS | Status: AC
  Filled 2020-02-19: qty 50

## 2020-02-19 MED ORDER — ACETAMINOPHEN 325 MG PO TABS
ORAL_TABLET | ORAL | Status: AC
  Filled 2020-02-19: qty 2

## 2020-02-19 MED ORDER — IPILIMUMAB CHEMO INJECTION 200 MG/40ML
1.0000 mg/kg | Freq: Once | INTRAVENOUS | Status: AC
Start: 2020-02-19 — End: 2020-02-19
  Administered 2020-02-19: 70 mg via INTRAVENOUS
  Filled 2020-02-19: qty 14

## 2020-02-19 MED ORDER — FAMOTIDINE IN NACL 20-0.9 MG/50ML-% IV SOLN
20.0000 mg | Freq: Once | INTRAVENOUS | Status: AC
  Administered 2020-02-19: 20 mg via INTRAVENOUS

## 2020-02-19 MED ORDER — HEPARIN SOD (PORK) LOCK FLUSH 100 UNIT/ML IV SOLN
500.0000 [IU] | Freq: Once | INTRAVENOUS | Status: AC | PRN
  Administered 2020-02-19: 500 [IU]
  Filled 2020-02-19: qty 5

## 2020-02-19 MED ORDER — SODIUM CHLORIDE 0.9 % IV SOLN
Freq: Once | INTRAVENOUS | Status: AC
  Filled 2020-02-19: qty 250

## 2020-02-19 MED ORDER — PALONOSETRON HCL INJECTION 0.25 MG/5ML
INTRAVENOUS | Status: AC
  Filled 2020-02-19: qty 5

## 2020-02-19 MED ORDER — DIPHENHYDRAMINE HCL 50 MG/ML IJ SOLN
INTRAMUSCULAR | Status: AC
  Filled 2020-02-19: qty 1

## 2020-02-19 NOTE — Patient Instructions (Signed)
[facility] Discharge Instructions for Patients Receiving Chemotherapy  Today you received the following chemotherapy agents: Rae Halsted  To help prevent nausea and vomiting after your treatment, we encourage you to take your nausea medication as directed.    If you develop nausea and vomiting that is not controlled by your nausea medication, call the clinic.   BELOW ARE SYMPTOMS THAT SHOULD BE REPORTED IMMEDIATELY:  *FEVER GREATER THAN 100.5 F  *CHILLS WITH OR WITHOUT FEVER  NAUSEA AND VOMITING THAT IS NOT CONTROLLED WITH YOUR NAUSEA MEDICATION  *UNUSUAL SHORTNESS OF BREATH  *UNUSUAL BRUISING OR BLEEDING  TENDERNESS IN MOUTH AND THROAT WITH OR WITHOUT PRESENCE OF ULCERS  *URINARY PROBLEMS  *BOWEL PROBLEMS  UNUSUAL RASH Items with * indicate a potential emergency and should be followed up as soon as possible.  Feel free to call the clinic should you have any questions or concerns. The clinic phone number is [phone].  Please show the [place] at check-in to the Emergency Department and triage nurse.

## 2020-02-20 ENCOUNTER — Telehealth: Payer: Self-pay

## 2020-02-20 ENCOUNTER — Telehealth: Payer: Self-pay | Admitting: Family Medicine

## 2020-02-20 NOTE — Telephone Encounter (Signed)
Pt called and states that at last appt he informed [facility] that all meds needed to go to [facility]. He needed refills on all meds to [facility]. Pt didn't know when meds just all of the ones [facility] fills. Please send all meds to [facility] mail order [facility]. Pt can be reached at [phone].

## 2020-02-20 NOTE — Telephone Encounter (Signed)
Received a call from pt and his new insurance does not cover carafate. He will need something else. Pt uses [facility] [place]

## 2020-02-20 NOTE — Telephone Encounter (Signed)
Done KH 

## 2020-02-20 NOTE — Telephone Encounter (Signed)
Have him use Nexium 2 pills daily for the next couple of weeks

## 2020-02-20 NOTE — Telephone Encounter (Signed)
Pt is requesting his dronabinol 2.5 mg to be fill at [facility]. Thanks [employer]

## 2020-02-21 NOTE — Telephone Encounter (Signed)
Pt wife advised . [place]

## 2020-02-26 ENCOUNTER — Telehealth: Payer: Self-pay

## 2020-02-26 NOTE — Telephone Encounter (Signed)
Pt. Called stating that he needs all of his medicines switched to [facility] that Dr. Redmond School fills pts. Last apt was 02/12/20 and next apt is 04/27/20.

## 2020-02-26 NOTE — Telephone Encounter (Signed)
Pt was advised to contact pharmacy and before he runs out of any med he needs to contact [place] to request a refill. [place]

## 2020-03-04 ENCOUNTER — Ambulatory Visit: Admit: 2020-03-04 | Payer: Medicare HMO | Admitting: Otolaryngology

## 2020-03-04 SURGERY — CREATION, TRACHEOSTOMY, PERCUTANEOUS
Anesthesia: General

## 2020-03-05 ENCOUNTER — Telehealth: Payer: Self-pay | Admitting: Medical Oncology

## 2020-03-05 ENCOUNTER — Telehealth: Payer: Self-pay

## 2020-03-05 NOTE — Telephone Encounter (Signed)
Pt called requesting the contrast for his CT scan tomorrow. I advised the pt we will leave the back with the contrast for him at the front desk. He expressed understanding of this informaiton.

## 2020-03-05 NOTE — Telephone Encounter (Signed)
LVM to return my call as I returned his call.

## 2020-03-06 ENCOUNTER — Other Ambulatory Visit: Payer: Self-pay

## 2020-03-06 ENCOUNTER — Encounter (HOSPITAL_COMMUNITY): Payer: Self-pay

## 2020-03-06 ENCOUNTER — Ambulatory Visit (HOSPITAL_COMMUNITY)
Admission: RE | Admit: 2020-03-06 | Discharge: 2020-03-06 | Disposition: A | Discharge status: Home / Self Care | Payer: Medicare HMO | Source: Ambulatory Visit | Attending: Physician Assistant | Admitting: Physician Assistant

## 2020-03-06 ENCOUNTER — Telehealth: Payer: Self-pay | Admitting: Family Medicine

## 2020-03-06 DIAGNOSIS — J90 Pleural effusion, not elsewhere classified: Secondary | ICD-10-CM | POA: Diagnosis not present

## 2020-03-06 DIAGNOSIS — K4090 Unilateral inguinal hernia, without obstruction or gangrene, not specified as recurrent: Secondary | ICD-10-CM | POA: Diagnosis not present

## 2020-03-06 DIAGNOSIS — N2889 Other specified disorders of kidney and ureter: Secondary | ICD-10-CM | POA: Diagnosis not present

## 2020-03-06 DIAGNOSIS — C3490 Malignant neoplasm of unspecified part of unspecified bronchus or lung: Secondary | ICD-10-CM | POA: Diagnosis not present

## 2020-03-06 DIAGNOSIS — Z8546 Personal history of malignant neoplasm of prostate: Secondary | ICD-10-CM | POA: Diagnosis not present

## 2020-03-06 DIAGNOSIS — Z85118 Personal history of other malignant neoplasm of bronchus and lung: Secondary | ICD-10-CM | POA: Diagnosis not present

## 2020-03-06 DIAGNOSIS — I2510 Atherosclerotic heart disease of native coronary artery without angina pectoris: Secondary | ICD-10-CM | POA: Diagnosis not present

## 2020-03-06 MED ORDER — IOHEXOL 300 MG/ML  SOLN
100.0000 mL | Freq: Once | INTRAMUSCULAR | Status: AC | PRN
  Administered 2020-03-06: 100 mL via INTRAVENOUS

## 2020-03-06 NOTE — Progress Notes (Signed)
[facility] OFFICE PROGRESS NOTE  Shaun Kennedy, [place] Shaun Kennedy [address]  DIAGNOSIS: Stage IV (T3, N2, M1 a) non-small cell Kennedy cancer, squamous cell carcinoma presented with obstructive left lower lobe Kennedy mass in addition to mediastinal lymphadenopathy as well as bilateral pulmonary nodules diagnosed in July 2021.  PDL1:0%  PRIOR THERAPY: Palliative radiotherapy to the obstructive left lower lobe Kennedy mass under the care of Dr. Lisbeth Kennedy.Completed on 09/20/19  CURRENT THERAPY: Systemic chemotherapy 2 cycles of chemotherapy with carboplatin for an AUC of5and paclitaxel175 mg/m2in addition to immunotherapy with nivolumab360 mgevery 3 weeks and ipilimumab1 mg/kgIV every 6 weeksfollowed by maintenance nivolumab and ipilimumab.He started the first treatment on 09/04/2019. He is status postDay 1 cycle #5of the treatment.  INTERVAL HISTORY: Shaun Kennedy 80 y.o. male returns to the clinic today forafollow-up visit.  The patient is feeling well today without any concerning complaints. He saw ENT Dr. Lucia Kennedy and Dr. Redmond Kennedy for stidor secondary to poor vocal cord movement. They discussed possible tracheostomy. However, they are arranging for a second opinion at [facility] to see what other surgical options are available for this patient.He met with [facility] yesterday who reportedly also recommended a tracheostomy. The patient and his wife are still discussing before they make a decision.   Otherwise, regarding his treatment, he is tolerating treatment well without any adverse side effects. The patient denies any recent fever, chills, night sweats, or recent weight loss. The patient denies any nausea, vomiting, or diarrhea. He has occasional constipation and takes colace twice a day. Hedenies any chest pain, cough, or hemoptysis. He reports baseline shortness of breath with exertion which is unchanged.He denies any headache or visual  changes. He denies any rashes or skin changes. The patient recently had a restaging CT scan performed. He is here today for evaluation  before starting day 21 cycle #5.   MEDICAL HISTORY: Past Medical History:  Diagnosis Date  . Allergic rhinitis   . Asthma   . Carotid stenosis   . Colon cancer ([place]) 2003  . Diabetes mellitus ([place])   . Diverticulosis   . Dyslipidemia   . ED (erectile dysfunction)   . GERD (gastroesophageal reflux disease)   . H/O degenerative disc disease   . Hemorrhoids   . HTN (hypertension)   . Hyperlipidemia   . Kennedy cancer ([place])   . LVH (left ventricular hypertrophy)    on EKG  . Mass of lower lobe of left Kennedy   . Mediastinal adenopathy   . Smoker    former  . Wears dentures   . Wears dentures   . Wears glasses   . Wears glasses     ALLERGIES:  has No Known Allergies.  MEDICATIONS:  Current Outpatient Medications  Medication Sig Dispense Refill  . amLODipine (NORVASC) 5 MG tablet TAKE 1 TABLET BY MOUTH EVERY DAY (Patient taking differently: Take 5 mg by mouth daily.) 90 tablet 3  . aspirin EC 81 MG tablet Take 81 mg by mouth daily after breakfast.     . Blood Glucose Monitoring Suppl (ONETOUCH VERIO) w/Device KIT USE TO CHECK SUGAR DAILY 1 kit 0  . Blood Glucose Monitoring Suppl (ONETOUCH VERIO) w/Device KIT USE TO CHECK SUGAR DAILY 1 kit 0  . cholecalciferol (VITAMIN D) 1000 units tablet Take 1,000 Units by mouth daily after breakfast.     . cilostazol (PLETAL) 100 MG tablet Take 100 mg by mouth 2 (two) times daily.    Marland Kitchen  dronabinol (MARINOL) 2.5 MG capsule TAKE 1 CAPSULE BY MOUTH 2 TIMES DAILY BEFORE A MEAL. 30 capsule 1  . ferrous sulfate 325 (65 FE) MG EC tablet Take 1 tablet (325 mg total) by mouth daily with breakfast. 30 tablet 12  . gabapentin (NEURONTIN) 100 MG capsule Take 1 capsule (100 mg total) by mouth 3 (three) times daily. 90 capsule 1  . Lancets (ONETOUCH DELICA PLUS [id number]) MISC PATIENT IS TO TEST TWO TIMES A DAY DX: E11.9  (ONETOUCH VERIO FLEX) 100 each 12  . LINZESS 72 MCG capsule Take 72 mcg by mouth every morning.    Marland Kitchen lisinopril-hydrochlorothiazide (ZESTORETIC) 20-12.5 MG tablet TAKE 1 TABLET BY MOUTH EVERY DAY (Patient taking differently: Take 1 tablet by mouth daily.) 90 tablet 2  . metFORMIN (GLUCOPHAGE-XR) 500 MG 24 hr tablet TAKE 1 TABLET BY MOUTH EVERY DAY 90 tablet 1  . mirtazapine (REMERON) 15 MG tablet TAKE 1 TABLET BY MOUTH EVERYDAY AT BEDTIME 90 tablet 1  . Multiple Vitamin (MULTIVITAMIN WITH MINERALS) TABS tablet Take 1 tablet by mouth daily after breakfast.     . ONETOUCH VERIO test strip USE AS INSTRUCTED TO CHECK TWICE DAILY 200 strip 5  . simvastatin (ZOCOR) 40 MG tablet TAKE 1 TABLET BY MOUTH EVERY DAY 90 tablet 3  . sucralfate (CARAFATE) 1 GM/10ML suspension TAKE 10 MLS BY MOUTH 4 TIMES DAILY - WITH MEALS AND AT BEDTIME. (Patient taking differently: Take 1 g by mouth 4 (four) times daily -  with meals and at bedtime.) 420 mL 0  . guaiFENesin-codeine 100-10 MG/5ML syrup TAKE 5ML BY MOUTH 3 TIMES A DAY AS NEEDED FOR COUGH (Patient not taking: Reported on 03/11/2020) 240 mL 0  . lidocaine-prilocaine (EMLA) cream Apply to the Port-A-Cath site 30-60-minute before chemotherapy. (Patient not taking: Reported on 03/11/2020) 30 g 0   No current facility-administered medications for this visit.   Facility-Administered Medications Ordered in Other Visits  Medication Dose Route Frequency Provider Last Rate Last Admin  . sodium chloride flush (NS) 0.9 % injection 10 mL  10 mL Intracatheter PRN Shaun Bears, MD   10 mL at 09/04/19 1556    SURGICAL HISTORY:  Past Surgical History:  Procedure Laterality Date  . BRONCHIAL BIOPSY  08/20/2019   Procedure: BRONCHIAL BIOPSIES;  Surgeon: Shaun Gobble, MD;  Location[facility] ENDOSCOPY;  Service: Pulmonary;;  . BRONCHIAL BRUSHINGS  08/20/2019   Procedure: BRONCHIAL BRUSHINGS;  Surgeon: Shaun Gobble, MD;  Location: [facility] ENDOSCOPY;  Service: Pulmonary;;  . BRONCHIAL  NEEDLE ASPIRATION BIOPSY  08/20/2019   Procedure: BRONCHIAL NEEDLE ASPIRATION BIOPSIES;  Surgeon: Shaun Gobble, MD;  Location: [facility] ENDOSCOPY;  Service: Pulmonary;;  . CARPAL TUNNEL RELEASE Right 01/09/2019   Procedure: CARPAL TUNNEL RELEASE;  Surgeon: Shaun Koyanagi, MD;  Location: [place];  Service: Orthopedics;  Laterality: Right;  . CATARACT EXTRACTION Right 2018  . COLONOSCOPY  2007   Dr. Benson Norway  . I & D EXTREMITY Right 04/02/2016   Procedure: IRRIGATION AND DEBRIDEMENT GREAT TOE;  Surgeon: Shaun Koyanagi, MD;  Location: [place];  Service: Orthopedics;  Laterality: Right;  . IR IMAGING GUIDED PORT INSERTION  08/30/2019  . MULTIPLE TOOTH EXTRACTIONS    . RADIOACTIVE SEED IMPLANT  2003  . ULNAR TUNNEL RELEASE Right 01/09/2019   Procedure: RIGHT CUBITAL TUNNEL RELEASE AND CARPAL TUNNEL RELEASE;  Surgeon: Shaun Koyanagi, MD;  Location: [place];  Service: Orthopedics;  Laterality: Right;  . UPPER GASTROINTESTINAL ENDOSCOPY    .  VIDEO BRONCHOSCOPY N/A 12/18/2019   Procedure: VIDEO BRONCHOSCOPY WITHOUT FLUORO;  Surgeon: Shaun Gobble, MD;  Location: [facility] ENDOSCOPY;  Service: Cardiopulmonary;  Laterality: N/A;  . VIDEO BRONCHOSCOPY WITH ENDOBRONCHIAL ULTRASOUND N/A 08/20/2019   Procedure: VIDEO BRONCHOSCOPY WITH ENDOBRONCHIAL ULTRASOUND;  Surgeon: Shaun Gobble, MD;  Location: [facility] ENDOSCOPY;  Service: Pulmonary;  Laterality: N/A;    REVIEW OF SYSTEMS:   Review of Systems  Constitutional: Negative for appetite change, chills, fatigue, fever and unexpected weight change.  HENT: Negative for mouth sores, nosebleeds, sore throat and trouble swallowing.  Positive for stidor.  Eyes: Negative for eye problems and icterus.  Respiratory: Positive for shortness of breath (unchanged). Negative for cough, hemoptysis, and wheezing.   Cardiovascular: Negative for chest pain and leg swelling.  Gastrointestinal: Positive for constipation. Negative for abdominal pain, diarrhea, nausea  and vomiting.  Genitourinary: Negative for bladder incontinence, difficulty urinating, dysuria, frequency and hematuria.   Musculoskeletal: Negative for back pain, gait problem, neck pain and neck stiffness.  Skin: Negative for itching and rash.  Neurological: Negative for dizziness, extremity weakness, gait problem, headaches, light-headedness and seizures.  Hematological: Negative for adenopathy. Does not bruise/bleed easily.  Psychiatric/Behavioral: Negative for confusion, depression and sleep disturbance. The patient is not nervous/anxious.    PHYSICAL EXAMINATION:  Blood pressure 127/68, pulse 78, temperature 97.8 F (36.6 C), temperature source Tympanic, resp. rate 20, height '5\' 7"'  (1.702 m), weight 170 lb 11.2 oz (77.4 kg), SpO2 98 %.  ECOG PERFORMANCE STATUS: 1 - Symptomatic but completely ambulatory  Physical Exam  Constitutional: Oriented to person, place, and time and well-developed, well-nourished, and in no distress.  HENT:  Head: Normocephalic and atraumatic.  Mouth/Throat: Oropharynx is clear and moist. No oropharyngeal exudate.  Eyes: Conjunctivae are normal. Right eye exhibits no discharge. Left eye exhibits no discharge. No scleral icterus.  Neck: Normal range of motion. Neck supple.  Cardiovascular: Normal rate, regular rhythm, normal heart sounds and intact distal pulses.   Pulmonary/Chest: Effort normal and breath sounds normal. No respiratory distress. No wheezes. No rales.  Abdominal: Soft. Bowel sounds are normal. Exhibits no distension and no mass. There is no tenderness.  Musculoskeletal: Normal range of motion. Exhibits no edema.  Lymphadenopathy:    No cervical adenopathy.  Neurological: Alert and oriented to person, place, and time. Exhibits normal muscle tone. Gait normal. Coordination normal.  Skin: Skin is warm and dry. No rash noted. Not diaphoretic. No erythema. No pallor.  Psychiatric: Mood, memory and judgment normal.  Vitals reviewed.  LABORATORY  DATA: Lab Results  Component Value Date   WBC 4.4 03/11/2020   HGB 10.1 (L) 03/11/2020   HCT 30.2 (L) 03/11/2020   MCV 91.2 03/11/2020   PLT 256 03/11/2020      Chemistry      Component Value Date/Time   NA 139 02/19/2020 0840   NA 132 (L) 08/14/2019 0918   K 3.9 02/19/2020 0840   CL 105 02/19/2020 0840   CO2 27 02/19/2020 0840   BUN 15 02/19/2020 0840   BUN 16 08/14/2019 0918   CREATININE 1.06 02/19/2020 0840   CREATININE 1.25 (H) 08/08/2016 0912      Component Value Date/Time   CALCIUM 9.3 02/19/2020 0840   ALKPHOS 49 02/19/2020 0840   AST 19 02/19/2020 0840   ALT 12 02/19/2020 0840   BILITOT 0.7 02/19/2020 0840       RADIOGRAPHIC STUDIES:  CT Chest W Contrast  Result Date: 03/06/2020 CLINICAL DATA:  Primary Cancer Type: Kennedy Imaging  Indication: Assess response to therapy Interval therapy since last imaging? Yes Initial Cancer Diagnosis Date: 08/20/2019; Established by: Biopsy-proven Detailed Pathology: Stage IV non-small cell Kennedy cancer, squamous cell carcinoma. Primary Tumor location: Left lower lobe. Surgeries: No. Chemotherapy: Yes; Ongoing? Yes; Most recent administration: 02/19/2020 Immunotherapy?  Yes; Type: Nivolumab; Ongoing? Yes Radiation therapy? Yes; Date Range: 09/02/2019 - 09/20/2019; Target: Left Kennedy Other Cancer Therapies: Radioactive seed implant for prostate cancer 2003. EXAM: CT CHEST, ABDOMEN, AND PELVIS WITH CONTRAST TECHNIQUE: Multidetector CT imaging of the chest, abdomen and pelvis was performed following the standard protocol during bolus administration of intravenous contrast. CONTRAST:  164m OMNIPAQUE IOHEXOL 300 MG/ML  SOLN COMPARISON:  Most recent CT chest, abdomen and pelvis 12/12/2019. 09/13/2019 PET-CT. FINDINGS: CT CHEST FINDINGS Cardiovascular: Right Port-A-Cath tip at superior caval/atrial junction. Aortic atherosclerosis. Normal heart size, without pericardial effusion. Multivessel coronary artery atherosclerosis. No central pulmonary  embolism, on this non-dedicated study. Mediastinum/Nodes: No supraclavicular adenopathy. Subcarinal node of 1.4 cm on 26/2, decreased from 1.8 cm previously. No right and no definite left hilar adenopathy. Lungs/Pleura: Small left pleural effusion, increased. Centrilobular and paraseptal emphysema. Worsened presumed radiation induced fibrosis within the left paramediastinal and perihilar Kennedy. The cavitary superior segment left lower lobe Kennedy lesion is decreased in size, 2.9 x 2.0 cm on 53/7 versus 3.6 x 2.5 cm on the prior exam. Scattered pulmonary nodules are felt to be similar. Example adjacent nodules in the left upper lobe at 6 and 5 mm on 32/7. A left lower lobe 6 mm nodule on 83/7 is also not significantly changed. Musculoskeletal: No acute osseous abnormality. CT ABDOMEN PELVIS FINDINGS Hepatobiliary: Suspect similar too small to characterize segment 2 lesions at 2-3 mm including on 46/2, favored to represent cysts. Normal gallbladder, without biliary ductal dilatation. Pancreas: Suspect pancreas divisum or variant, with a prominent dorsal duct entering the duodenum on 63/2. No duct dilatation or acute inflammation. Spleen: Normal in size, without focal abnormality. Adrenals/Urinary Tract: Normal adrenal glands. Renal vascular calcifications. No hydronephrosis. Normal urinary bladder. Stomach/Bowel: Normal stomach, without wall thickening. Colonic stool burden suggests constipation. Normal terminal ileum. Normal small bowel. Vascular/Lymphatic: Aortic atherosclerosis. No abdominopelvic adenopathy. Reproductive: Radiation seeds in the prostate. Other: No significant free fluid. Tiny fat containing right inguinal hernia. Small bowel no longer positioned within. No evidence of omental or peritoneal disease. Musculoskeletal: Lumbosacral spondylosis. Disc bulges including at L4-5 and L5-S1. IMPRESSION: 1. Response to therapy, as evidenced by decreased size of cavitary left lower lobe Kennedy lesion and mediastinal  adenopathy. 2. Progressive radiation induced fibrosis within the perihilar and paramediastinal left Kennedy. Small left pleural effusion, increased. 3. Similar pulmonary metastasis. 4. No acute process or evidence of metastatic disease in the abdomen or pelvis. 5.  Possible constipation. 6. Aortic atherosclerosis (ICD10-I70.0), coronary artery atherosclerosis and emphysema (ICD10-J43.9). Electronically Signed   By: Shaun MiyamotoM.D.   On: 03/06/2020 10:23   CT Abdomen Pelvis W Contrast  Result Date: 03/06/2020 CLINICAL DATA:  Primary Cancer Type: Kennedy Imaging Indication: Assess response to therapy Interval therapy since last imaging? Yes Initial Cancer Diagnosis Date: 08/20/2019; Established by: Biopsy-proven Detailed Pathology: Stage IV non-small cell Kennedy cancer, squamous cell carcinoma. Primary Tumor location: Left lower lobe. Surgeries: No. Chemotherapy: Yes; Ongoing? Yes; Most recent administration: 02/19/2020 Immunotherapy?  Yes; Type: Nivolumab; Ongoing? Yes Radiation therapy? Yes; Date Range: 09/02/2019 - 09/20/2019; Target: Left Kennedy Other Cancer Therapies: Radioactive seed implant for prostate cancer 2003. EXAM: CT CHEST, ABDOMEN, AND PELVIS WITH CONTRAST TECHNIQUE: Multidetector CT imaging of the  chest, abdomen and pelvis was performed following the standard protocol during bolus administration of intravenous contrast. CONTRAST:  170m OMNIPAQUE IOHEXOL 300 MG/ML  SOLN COMPARISON:  Most recent CT chest, abdomen and pelvis 12/12/2019. 09/13/2019 PET-CT. FINDINGS: CT CHEST FINDINGS Cardiovascular: Right Port-A-Cath tip at superior caval/atrial junction. Aortic atherosclerosis. Normal heart size, without pericardial effusion. Multivessel coronary artery atherosclerosis. No central pulmonary embolism, on this non-dedicated study. Mediastinum/Nodes: No supraclavicular adenopathy. Subcarinal node of 1.4 cm on 26/2, decreased from 1.8 cm previously. No right and no definite left hilar adenopathy. Lungs/Pleura:  Small left pleural effusion, increased. Centrilobular and paraseptal emphysema. Worsened presumed radiation induced fibrosis within the left paramediastinal and perihilar Kennedy. The cavitary superior segment left lower lobe Kennedy lesion is decreased in size, 2.9 x 2.0 cm on 53/7 versus 3.6 x 2.5 cm on the prior exam. Scattered pulmonary nodules are felt to be similar. Example adjacent nodules in the left upper lobe at 6 and 5 mm on 32/7. A left lower lobe 6 mm nodule on 83/7 is also not significantly changed. Musculoskeletal: No acute osseous abnormality. CT ABDOMEN PELVIS FINDINGS Hepatobiliary: Suspect similar too small to characterize segment 2 lesions at 2-3 mm including on 46/2, favored to represent cysts. Normal gallbladder, without biliary ductal dilatation. Pancreas: Suspect pancreas divisum or variant, with a prominent dorsal duct entering the duodenum on 63/2. No duct dilatation or acute inflammation. Spleen: Normal in size, without focal abnormality. Adrenals/Urinary Tract: Normal adrenal glands. Renal vascular calcifications. No hydronephrosis. Normal urinary bladder. Stomach/Bowel: Normal stomach, without wall thickening. Colonic stool burden suggests constipation. Normal terminal ileum. Normal small bowel. Vascular/Lymphatic: Aortic atherosclerosis. No abdominopelvic adenopathy. Reproductive: Radiation seeds in the prostate. Other: No significant free fluid. Tiny fat containing right inguinal hernia. Small bowel no longer positioned within. No evidence of omental or peritoneal disease. Musculoskeletal: Lumbosacral spondylosis. Disc bulges including at L4-5 and L5-S1. IMPRESSION: 1. Response to therapy, as evidenced by decreased size of cavitary left lower lobe Kennedy lesion and mediastinal adenopathy. 2. Progressive radiation induced fibrosis within the perihilar and paramediastinal left Kennedy. Small left pleural effusion, increased. 3. Similar pulmonary metastasis. 4. No acute process or evidence of  metastatic disease in the abdomen or pelvis. 5.  Possible constipation. 6. Aortic atherosclerosis (ICD10-I70.0), coronary artery atherosclerosis and emphysema (ICD10-J43.9). Electronically Signed   By: Shaun MiyamotoM.D.   On: 03/06/2020 10:23     ASSESSMENT/PLAN:  This is a very pleasant 80year old [name] diagnosed with stage IV (T3, N2, M1a) non-small cell Kennedy cancer, squamous cell carcinoma. He presented with a left lower lobe obstructive Kennedy mass in addition to mediastinal lymphadenopathy. He also presented with bilateral pulmonary nodules. He was diagnosed in July 2021. His PD-L1 expression is negative.  Hecompletedpalliative radiotherapy to the obstructive Kennedy mass under the care of Dr. MLisbeth Renshawin August 2021.  The patient is currently undergoing systemic chemotherapy with carboplatin and paclitaxel for 2 cycles in addition to immunotherapy with ipilimumab 1 mg/KG every 6 weeks and nivolumab 360 mg IV every 3 weeks.He is status post day 1 of cycle 5   The patient recently had a restaging CT scan performed. Dr. MJulien Nordmannpersonally and independently reviewed the scan and discussed the results with the patient. The scan did not show any evidence for disease progression. Recommend that he proceed with day 21 cycle #5 today as scheduled.   We will see the patient back for a follow up visit in 3 weeks for evaluation before starting day 1 C6.   He will  continue to follow with ENT regarding his stidor and possible interventions. Patient's oxygen is 98% on room air. Breathing comfortably at rest.  The patient was advised to call immediately if he has any concerning symptoms in the interval. The patient voices understanding of current disease status and treatment options and is in agreement with the current care plan. All questions were answered. The patient knows to call the clinic with any problems, questions or concerns. We can certainly see the patient much sooner if  necessary      No orders of the defined types were placed in this encounter.    Marializ Ferrebee L Jidenna Figgs, PA-C 03/11/20   ADDENDUM: Hematology/Oncology Attending: I had a face-to-face encounter with the patient today.  I reviewed his lab and scan and recommended his care plan.  This is a very pleasant 80 years old [name] with a stage IV non-small cell Kennedy cancer, squamous cell carcinoma presented with left lower lobe obstructive Kennedy mass in addition to mediastinal lymphadenopathy and bilateral pulmonary nodules diagnosed in July 2021 with negative PD-L1 expression.  The patient was started on systemic chemotherapy with carboplatin and paclitaxel for 2 cycles in addition to immunotherapy with ipilimumab and nivolumab with the initial 2 cycles of the chemotherapy followed by maintenance treatment with ipilimumab 1 mg/KG every 6 weeks and nivolumab 360 mg IV every 3 weeks status post 4 cycles and he is currently undergoing cycle #5.  The patient has been tolerating his treatment well with no concerning adverse effects. He had repeat CT scan of the chest, abdomen pelvis performed recently.  I personally and independently reviewed the scan images and discussed the results with the patient today. His scan showed no concerning findings for disease progression and there was further decrease in the size of the cavitary left lower lobe Kennedy mass. I recommended for the patient to continue his current treatment with maintenance ipilimumab and nivolumab as planned and he will proceed with the #22 of cycle #5 today. The patient will come back for follow-up visit in 3 weeks for evaluation before starting cycle #6. For the history of upper respiratory stridor, he was seen by ENT in [place] as well as [facility] and they recommended for him consideration of tracheostomy but he still thinking about his options. The patient was advised to call immediately if  he has any other concerning symptoms in the interval.  Disclaimer: This note was dictated with voice recognition software. Similar sounding words can inadvertently be transcribed and may be missed upon review. Eilleen Kempf, MD 03/11/20

## 2020-03-10 DIAGNOSIS — J383 Other diseases of vocal cords: Secondary | ICD-10-CM | POA: Diagnosis not present

## 2020-03-10 DIAGNOSIS — Z9221 Personal history of antineoplastic chemotherapy: Secondary | ICD-10-CM | POA: Diagnosis not present

## 2020-03-10 DIAGNOSIS — J386 Stenosis of larynx: Secondary | ICD-10-CM | POA: Diagnosis not present

## 2020-03-10 DIAGNOSIS — Z923 Personal history of irradiation: Secondary | ICD-10-CM | POA: Diagnosis not present

## 2020-03-10 DIAGNOSIS — R061 Stridor: Secondary | ICD-10-CM | POA: Diagnosis not present

## 2020-03-10 DIAGNOSIS — J3802 Paralysis of vocal cords and larynx, bilateral: Secondary | ICD-10-CM | POA: Diagnosis not present

## 2020-03-10 DIAGNOSIS — J384 Edema of larynx: Secondary | ICD-10-CM | POA: Diagnosis not present

## 2020-03-10 DIAGNOSIS — Z85118 Personal history of other malignant neoplasm of bronchus and lung: Secondary | ICD-10-CM | POA: Diagnosis not present

## 2020-03-11 ENCOUNTER — Inpatient Hospital Stay: Payer: Medicare HMO | Admitting: Physician Assistant

## 2020-03-11 ENCOUNTER — Other Ambulatory Visit: Payer: Self-pay

## 2020-03-11 ENCOUNTER — Inpatient Hospital Stay: Payer: Medicare HMO | Attending: Internal Medicine

## 2020-03-11 ENCOUNTER — Inpatient Hospital Stay: Payer: Medicare HMO

## 2020-03-11 VITALS — BP 127/68 | HR 78 | Temp 97.8°F | Resp 20 | Ht 67.0 in | Wt 170.7 lb

## 2020-03-11 DIAGNOSIS — I10 Essential (primary) hypertension: Secondary | ICD-10-CM | POA: Insufficient documentation

## 2020-03-11 DIAGNOSIS — C3432 Malignant neoplasm of lower lobe, left bronchus or lung: Secondary | ICD-10-CM | POA: Diagnosis not present

## 2020-03-11 DIAGNOSIS — Z87891 Personal history of nicotine dependence: Secondary | ICD-10-CM | POA: Diagnosis not present

## 2020-03-11 DIAGNOSIS — K219 Gastro-esophageal reflux disease without esophagitis: Secondary | ICD-10-CM | POA: Diagnosis not present

## 2020-03-11 DIAGNOSIS — Z7984 Long term (current) use of oral hypoglycemic drugs: Secondary | ICD-10-CM | POA: Diagnosis not present

## 2020-03-11 DIAGNOSIS — E785 Hyperlipidemia, unspecified: Secondary | ICD-10-CM | POA: Diagnosis not present

## 2020-03-11 DIAGNOSIS — Z79899 Other long term (current) drug therapy: Secondary | ICD-10-CM | POA: Insufficient documentation

## 2020-03-11 DIAGNOSIS — Z7982 Long term (current) use of aspirin: Secondary | ICD-10-CM | POA: Insufficient documentation

## 2020-03-11 DIAGNOSIS — Z8546 Personal history of malignant neoplasm of prostate: Secondary | ICD-10-CM | POA: Insufficient documentation

## 2020-03-11 DIAGNOSIS — J45909 Unspecified asthma, uncomplicated: Secondary | ICD-10-CM | POA: Diagnosis not present

## 2020-03-11 DIAGNOSIS — Z5112 Encounter for antineoplastic immunotherapy: Secondary | ICD-10-CM | POA: Insufficient documentation

## 2020-03-11 DIAGNOSIS — Z85038 Personal history of other malignant neoplasm of large intestine: Secondary | ICD-10-CM | POA: Diagnosis not present

## 2020-03-11 DIAGNOSIS — Z7952 Long term (current) use of systemic steroids: Secondary | ICD-10-CM | POA: Diagnosis not present

## 2020-03-11 DIAGNOSIS — I2510 Atherosclerotic heart disease of native coronary artery without angina pectoris: Secondary | ICD-10-CM | POA: Insufficient documentation

## 2020-03-11 DIAGNOSIS — E119 Type 2 diabetes mellitus without complications: Secondary | ICD-10-CM | POA: Diagnosis not present

## 2020-03-11 DIAGNOSIS — Z95828 Presence of other vascular implants and grafts: Secondary | ICD-10-CM

## 2020-03-11 DIAGNOSIS — C3490 Malignant neoplasm of unspecified part of unspecified bronchus or lung: Secondary | ICD-10-CM

## 2020-03-11 LAB — CMP (CANCER CENTER ONLY)
ALT: 14 U/L (ref 0–44)
AST: 18 U/L (ref 15–41)
Albumin: 3.6 g/dL (ref 3.5–5.0)
Alkaline Phosphatase: 50 U/L (ref 38–126)
Anion gap: 7 (ref 5–15)
BUN: 17 mg/dL (ref 8–23)
CO2: 28 mmol/L (ref 22–32)
Calcium: 9.3 mg/dL (ref 8.9–10.3)
Chloride: 104 mmol/L (ref 98–111)
Creatinine: 1.17 mg/dL (ref 0.61–1.24)
GFR, Estimated: >60 mL/min (ref >60)
Glucose, Bld: 140 mg/dL — ABNORMAL HIGH (ref 70–99)
Potassium: 3.7 mmol/L (ref 3.5–5.1)
Sodium: 139 mmol/L (ref 135–145)
Total Bilirubin: 0.8 mg/dL (ref 0.3–1.2)
Total Protein: 7.9 g/dL (ref 6.5–8.1)

## 2020-03-11 LAB — CBC WITH DIFFERENTIAL (CANCER CENTER ONLY)
Abs Immature Granulocytes: 0.01 10*3/uL (ref 0.00–0.07)
Basophils Absolute: 0 10*3/uL (ref 0.0–0.1)
Basophils Relative: 1 %
Eosinophils Absolute: 0.1 10*3/uL (ref 0.0–0.5)
Eosinophils Relative: 3 %
HCT: 30.2 % — ABNORMAL LOW (ref 39.0–52.0)
Hemoglobin: 10.1 g/dL — ABNORMAL LOW (ref 13.0–17.0)
Immature Granulocytes: 0 %
Lymphocytes Relative: 26 %
Lymphs Abs: 1.1 10*3/uL (ref 0.7–4.0)
MCH: 30.5 pg (ref 26.0–34.0)
MCHC: 33.4 g/dL (ref 30.0–36.0)
MCV: 91.2 fL (ref 80.0–100.0)
Monocytes Absolute: 0.5 10*3/uL (ref 0.1–1.0)
Monocytes Relative: 11 %
Neutro Abs: 2.6 10*3/uL (ref 1.7–7.7)
Neutrophils Relative %: 59 %
Platelet Count: 256 10*3/uL (ref 150–400)
RBC: 3.31 MIL/uL — ABNORMAL LOW (ref 4.22–5.81)
RDW: 13.3 % (ref 11.5–15.5)
WBC Count: 4.4 10*3/uL (ref 4.0–10.5)
nRBC: 0 % (ref 0.0–0.2)

## 2020-03-11 MED ORDER — SODIUM CHLORIDE 0.9% FLUSH
10.0000 mL | Freq: Once | INTRAVENOUS | Status: AC
  Administered 2020-03-11: 10 mL
  Filled 2020-03-11: qty 10

## 2020-03-11 MED ORDER — HEPARIN SOD (PORK) LOCK FLUSH 100 UNIT/ML IV SOLN
500.0000 [IU] | Freq: Once | INTRAVENOUS | Status: AC | PRN
  Administered 2020-03-11: 500 [IU]
  Filled 2020-03-11: qty 5

## 2020-03-11 MED ORDER — SODIUM CHLORIDE 0.9 % IV SOLN
Freq: Once | INTRAVENOUS | Status: AC
  Filled 2020-03-11: qty 250

## 2020-03-11 MED ORDER — SODIUM CHLORIDE 0.9% FLUSH
10.0000 mL | INTRAVENOUS | Status: DC | PRN
  Administered 2020-03-11: 10 mL
  Filled 2020-03-11: qty 10

## 2020-03-11 MED ORDER — NIVOLUMAB CHEMO INJECTION 100 MG/10ML
360.0000 mg | Freq: Once | INTRAVENOUS | Status: AC
  Administered 2020-03-11: 360 mg via INTRAVENOUS
  Filled 2020-03-11: qty 24

## 2020-03-11 NOTE — Patient Instructions (Signed)

## 2020-03-11 NOTE — Patient Instructions (Signed)
[facility] Discharge Instructions for Patients Receiving Chemotherapy  Today you received the following chemotherapy agents Opdivo  To help prevent nausea and vomiting after your treatment, we encourage you to take your nausea medication as directed   If you develop nausea and vomiting that is not controlled by your nausea medication, call the clinic.   BELOW ARE SYMPTOMS THAT SHOULD BE REPORTED IMMEDIATELY:  *FEVER GREATER THAN 100.5 F  *CHILLS WITH OR WITHOUT FEVER  NAUSEA AND VOMITING THAT IS NOT CONTROLLED WITH YOUR NAUSEA MEDICATION  *UNUSUAL SHORTNESS OF BREATH  *UNUSUAL BRUISING OR BLEEDING  TENDERNESS IN MOUTH AND THROAT WITH OR WITHOUT PRESENCE OF ULCERS  *URINARY PROBLEMS  *BOWEL PROBLEMS  UNUSUAL RASH Items with * indicate a potential emergency and should be followed up as soon as possible.  Feel free to call the clinic should you have any questions or concerns. The clinic phone number is [phone].  Please show the CHEMO ALERT CARD at check-in to the Emergency Department and triage nurse.   

## 2020-03-12 ENCOUNTER — Telehealth: Payer: Self-pay | Admitting: Physician Assistant

## 2020-03-12 NOTE — Telephone Encounter (Signed)
Scheduled appointments per 2/2 los. Spoke to patient who is aware of appointments dates and times.  

## 2020-03-13 DIAGNOSIS — J386 Stenosis of larynx: Secondary | ICD-10-CM | POA: Diagnosis not present

## 2020-03-16 ENCOUNTER — Telehealth: Payer: Self-pay | Admitting: Emergency Medicine

## 2020-03-16 DIAGNOSIS — C3492 Malignant neoplasm of unspecified part of left bronchus or lung: Secondary | ICD-10-CM

## 2020-03-16 DIAGNOSIS — R0602 Shortness of breath: Secondary | ICD-10-CM

## 2020-03-16 NOTE — Telephone Encounter (Signed)
Called and spoke with patient's wife who states that he is having lung pain on left side that comes and goes. States that last Monday and this morning were really bad. States that out of 7 days a week he usually has it 3 days. Wife states that if her phone goes to voicemail please call her back again.   Please advise

## 2020-03-16 NOTE — Telephone Encounter (Signed)
Please make him an OV w APP so he can be seen, probably needs CXR

## 2020-03-16 NOTE — Telephone Encounter (Signed)
ATC patient's wife LMTCB

## 2020-03-16 NOTE — Telephone Encounter (Signed)
Called and spoke with patient's wife to let her know of Dr. Sudie Bailey recs. Patient had an appointment with Dr. Lamonte Sakai on 2/16 and wife stated that patient does not want to wait and would like to be seen sooner if possible. Patient has been scheduled to see Beth tomorrow at 3:30 with a CXR first. Nothing further needed at this time.

## 2020-03-17 ENCOUNTER — Ambulatory Visit: Payer: Medicare HMO | Admitting: Primary Care

## 2020-03-17 ENCOUNTER — Ambulatory Visit (INDEPENDENT_AMBULATORY_CARE_PROVIDER_SITE_OTHER): Payer: Medicare HMO

## 2020-03-17 ENCOUNTER — Encounter: Payer: Self-pay | Admitting: Primary Care

## 2020-03-17 ENCOUNTER — Other Ambulatory Visit: Payer: Self-pay

## 2020-03-17 VITALS — BP 112/62 | HR 71 | Temp 97.7°F | Ht 67.0 in | Wt 172.6 lb

## 2020-03-17 DIAGNOSIS — R0602 Shortness of breath: Secondary | ICD-10-CM

## 2020-03-17 DIAGNOSIS — R079 Chest pain, unspecified: Secondary | ICD-10-CM

## 2020-03-17 DIAGNOSIS — C3492 Malignant neoplasm of unspecified part of left bronchus or lung: Secondary | ICD-10-CM

## 2020-03-17 DIAGNOSIS — R0781 Pleurodynia: Secondary | ICD-10-CM | POA: Insufficient documentation

## 2020-03-17 DIAGNOSIS — C3490 Malignant neoplasm of unspecified part of unspecified bronchus or lung: Secondary | ICD-10-CM | POA: Diagnosis not present

## 2020-03-17 MED ORDER — PREDNISONE 20 MG PO TABS: ORAL_TABLET | ORAL | 0 refills | Status: AC

## 2020-03-17 NOTE — Progress Notes (Signed)
'@Patient'  ID: Shaun Kennedy, male    DOB: 1940/08/09, 80 y.o.   MRN: [id number]  Chief Complaint  Patient presents with  . Follow-up    Left Kennedy pain for 2 weeks.    Referring provider: [name], MD  HPI 80 year old male, former smoker quit 1989 (20-30-pack-year history).  Past medical history of asthma, malignant neoplasm of left lower Kennedy, hypertension, carotid stenosis, type 2 diabetes, Shaun Kennedy, chemotherapy-induced neuropathy, hyperlipidemia, vocal cord dysfunction.  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  He was treated with palliative XRT to the left lower lobe mass, systemic chemotherapy with immunotherapy (nivolumab and ipilimumab).  He was seen in the emergency department 10/18/2019 for dyspnea and underwent his most recent CT chest 10/18/2019 that showed no PE, improved mediastinal adenopathy, improved multifocal pulmonary nodular disease and a decrease in size of his primary left lower lobe mass.  There was a larger left pleural based nodule.  Small left effusion.  He was treated in our office with a prednisone taper 11/06/2019. Was started on Stiolto, converted over to Anoro, doesn't believe it is helping him much. He reports insp stridor, believes it is new since the bronchoscopy. Doesn't believe that the prednisone helped him.  Difficult to get a deep breath in - feels it more on the L.    Pulmonary function testing done today reviewed by me, show mild obstruction without a bronchodilator response, restricted Kennedy volumes and a normal diffusion capacity.  His flow volume loop of his inspiratory curve, to a lesser degree his expiratory curve is well  ROV 01/27/20 --Shaun Kennedy is 38, follows up today for his history of asthmatic COPD, newly diagnosed stage IV squamous  cell Kennedy cancer.  Has been undergoing palliative XRT to the left lower lobe mass.  He has an associated small left effusion.  His course has been concerning for stridor and possible vocal cord injury post bronchoscopy.  Did not change with prednisone.  He saw Shaun Kennedy on 12/16 and his fiberoptic exam showed poor abduction of the vocal folds with glottic narrowing likely causing his symptoms.  Unclear cause.  I am concerned that this was related to his intubation, ? Possibly his radiation. They discussed a possible airway vs surgery.  03/17/2020 Patietn presents today for acute visit. Patient has been experiencing intermittent left side Kennedy pain for the last week. He took tylenol which did not help. He has no other associated symptoms. Shortness of breath remains baseline. CXR today showed no acute findings. CT chest on 03/06/20 showed progressive radiation induced fibrosis and small pleura effusion.  Denies f/c/s, chest pain, wheezing, cough, nausea, vomiting or diarrhea.   No Known Allergies  Immunization History  Administered Date(s) Administered  . Fluad Quad(high Dose 65+) 10/17/2018, 10/28/2019  . Influenza, High Dose Seasonal PF 12/02/2015, 12/12/2016, 11/02/2017  . PFIZER(Purple Top)SARS-COV-2 Vaccination 04/05/2019, 05/01/2019, 10/28/2019  . Pneumococcal Conjugate-13 06/25/2014  . Pneumococcal Polysaccharide-23 06/03/2010  . Tdap 12/06/2007, 10/31/2019  . Zoster 02/08/2008  . Zoster Recombinat (Shingrix) 02/25/2017, 04/26/2017    Past Medical History:  Diagnosis Date  . Allergic rhinitis   . Asthma   . Carotid stenosis   . Colon cancer (Shaun Kennedy) 2003  . Diabetes mellitus ([place])   . Diverticulosis   . Dyslipidemia   . ED (erectile dysfunction)   .  GERD (gastroesophageal reflux disease)   . H/O degenerative disc disease   . Hemorrhoids   . HTN (hypertension)   . Hyperlipidemia   . Kennedy cancer ([place])   . LVH (left ventricular hypertrophy)    on EKG  . Mass of lower lobe of left  Kennedy   . Mediastinal adenopathy   . Smoker    former  . Wears dentures   . Wears dentures   . Wears glasses   . Wears glasses     Tobacco History: Social History   Tobacco Use  Smoking Status Former Smoker  . Packs/day: 1.00  . Years: 32.00  . Pack years: 32.00  . Start date: 02/08/1955  . Quit date: 10/09/1987  . Years since quitting: 32.4  Smokeless Tobacco Never Used   Counseling given: Not Answered   Outpatient Medications Prior to Visit  Medication Sig Dispense Refill  . amLODipine (NORVASC) 5 MG tablet TAKE 1 TABLET BY MOUTH EVERY DAY (Patient taking differently: Take 5 mg by mouth daily.) 90 tablet 3  . aspirin EC 81 MG tablet Take 81 mg by mouth daily after breakfast.     . Blood Glucose Monitoring Suppl (ONETOUCH VERIO) w/Device KIT USE TO CHECK SUGAR DAILY 1 kit 0  . Blood Glucose Monitoring Suppl (ONETOUCH VERIO) w/Device KIT USE TO CHECK SUGAR DAILY 1 kit 0  . cholecalciferol (VITAMIN D) 1000 units tablet Take 1,000 Units by mouth daily after breakfast.     . cilostazol (PLETAL) 100 MG tablet Take 100 mg by mouth 2 (two) times daily.    Marland Kitchen dronabinol (MARINOL) 2.5 MG capsule TAKE 1 CAPSULE BY MOUTH 2 TIMES DAILY BEFORE A MEAL. 30 capsule 1  . ferrous sulfate 325 (65 FE) MG EC tablet Take 1 tablet (325 mg total) by mouth daily with breakfast. 30 tablet 12  . gabapentin (NEURONTIN) 100 MG capsule Take 1 capsule (100 mg total) by mouth 3 (three) times daily. 90 capsule 1  . guaiFENesin-codeine 100-10 MG/5ML syrup TAKE 5ML BY MOUTH 3 TIMES A DAY AS NEEDED FOR COUGH 240 mL 0  . Lancets (ONETOUCH DELICA PLUS [id number]) MISC PATIENT IS TO TEST TWO TIMES A DAY DX: E11.9 (ONETOUCH VERIO FLEX) 100 each 12  . lidocaine-prilocaine (EMLA) cream Apply to the Port-A-Cath site 30-60-minute before chemotherapy. 30 g 0  . LINZESS 72 MCG capsule Take 72 mcg by mouth every morning.    Marland Kitchen lisinopril-hydrochlorothiazide (ZESTORETIC) 20-12.5 MG tablet TAKE 1 TABLET BY MOUTH EVERY DAY (Patient  taking differently: Take 1 tablet by mouth daily.) 90 tablet 2  . metFORMIN (GLUCOPHAGE-XR) 500 MG 24 hr tablet TAKE 1 TABLET BY MOUTH EVERY DAY 90 tablet 1  . mirtazapine (REMERON) 15 MG tablet TAKE 1 TABLET BY MOUTH EVERYDAY AT BEDTIME 90 tablet 1  . Multiple Vitamin (MULTIVITAMIN WITH MINERALS) TABS tablet Take 1 tablet by mouth daily after breakfast.     . ONETOUCH VERIO test strip USE AS INSTRUCTED TO CHECK TWICE DAILY 200 strip 5  . simvastatin (ZOCOR) 40 MG tablet TAKE 1 TABLET BY MOUTH EVERY DAY 90 tablet 3  . sucralfate (CARAFATE) 1 GM/10ML suspension TAKE 10 MLS BY MOUTH 4 TIMES DAILY - WITH MEALS AND AT BEDTIME. (Patient taking differently: Take 1 g by mouth 4 (four) times daily -  with meals and at bedtime.) 420 mL 0   Facility-Administered Medications Prior to Visit  Medication Dose Route Frequency Provider Last Rate Last Admin  . sodium chloride flush (NS) 0.9 % injection 10  mL  10 mL Intracatheter PRN Curt Bears, MD   10 mL at 09/04/19 1556   Review of Systems  Review of Systems  Respiratory: Negative for cough and shortness of breath.        Left sided pleuritic pain   Gastrointestinal: Negative for constipation, diarrhea and nausea.   Physical Exam  BP 112/62 (BP Location: Right Arm, Cuff Size: Normal)   Pulse 71   Temp 97.7 F (36.5 C) (Oral)   Ht '5\' 7"'  (1.702 m)   Wt 172 lb 9.6 oz (78.3 kg)   SpO2 100%   BMI 27.03 kg/m  Physical Exam Constitutional:      Appearance: Normal appearance.  HENT:     Head: Normocephalic and atraumatic.  Cardiovascular:     Rate and Rhythm: Normal rate and regular rhythm.  Pulmonary:     Comments: Diminished  Musculoskeletal:        General: Normal range of motion.     Comments: MAEW  Neurological:     Mental Status: He is alert.      Lab Results:  CBC    Component Value Date/Time   WBC 4.4 03/11/2020 1415   WBC 9.5 10/18/2019 1240   RBC 3.31 (L) 03/11/2020 1415   HGB 10.1 (L) 03/11/2020 1415   HGB 9.8 (L)  08/14/2019 0918   HCT 30.2 (L) 03/11/2020 1415   HCT 30.1 (L) 08/14/2019 0918   PLT 256 03/11/2020 1415   PLT 549 (H) 08/14/2019 0918   MCV 91.2 03/11/2020 1415   MCV 87 08/14/2019 0918   MCH 30.5 03/11/2020 1415   MCHC 33.4 03/11/2020 1415   RDW 13.3 03/11/2020 1415   RDW 11.8 08/14/2019 0918   LYMPHSABS 1.1 03/11/2020 1415   LYMPHSABS 1.8 08/14/2019 0918   MONOABS 0.5 03/11/2020 1415   EOSABS 0.1 03/11/2020 1415   EOSABS 0.3 08/14/2019 0918   BASOSABS 0.0 03/11/2020 1415   BASOSABS 0.0 08/14/2019 0918    BMET    Component Value Date/Time   NA 139 03/11/2020 1415   NA 132 (L) 08/14/2019 0918   K 3.7 03/11/2020 1415   CL 104 03/11/2020 1415   CO2 28 03/11/2020 1415   GLUCOSE 140 (H) 03/11/2020 1415   BUN 17 03/11/2020 1415   BUN 16 08/14/2019 0918   CREATININE 1.17 03/11/2020 1415   CREATININE 1.25 (H) 08/08/2016 0912   CALCIUM 9.3 03/11/2020 1415   GFRNONAA >60 03/11/2020 1415   GFRAA >60 11/05/2019 1030    BNP    Component Value Date/Time   BNP 107.5 (H) 10/18/2019 1240    ProBNP No results found for: PROBNP  Imaging: DG Chest 2 View  Result Date: 03/17/2020 CLINICAL DATA:  Stage IV left Kennedy cancer, pain, short of breath EXAM: CHEST - 2 VIEW COMPARISON:  03/06/2020, 11/06/2019 FINDINGS: Frontal and lateral views of the chest demonstrates stable right chest wall port tip overlying superior vena cava. The cardiac silhouette is unremarkable. Left perihilar consolidation not appreciably changed since recent staging CT evaluation. Chronic volume loss left hemithorax. No effusion or pneumothorax. No acute bony abnormalities. IMPRESSION: 1. Chronic left perihilar consolidation consistent with known Kennedy cancer. No significant change since recent CT. Electronically Signed   By: Randa Ngo M.D.   On: 03/17/2020 15:42   CT Chest W Contrast  Result Date: 03/06/2020 CLINICAL DATA:  Primary Cancer Type: Kennedy Imaging Indication: Assess response to therapy Interval  therapy since last imaging? Yes Initial Cancer Diagnosis Date: 08/20/2019; Established by: Biopsy-proven Detailed  Pathology: Stage IV non-small cell Kennedy cancer, squamous cell carcinoma. Primary Tumor location: Left lower lobe. Surgeries: No. Chemotherapy: Yes; Ongoing? Yes; Most recent administration: 02/19/2020 Immunotherapy?  Yes; Type: Nivolumab; Ongoing? Yes Radiation therapy? Yes; Date Range: 09/02/2019 - 09/20/2019; Target: Left Kennedy Other Cancer Therapies: Radioactive seed implant for prostate cancer 2003. EXAM: CT CHEST, ABDOMEN, AND PELVIS WITH CONTRAST TECHNIQUE: Multidetector CT imaging of the chest, abdomen and pelvis was performed following the standard protocol during bolus administration of intravenous contrast. CONTRAST:  140m OMNIPAQUE IOHEXOL 300 MG/ML  SOLN COMPARISON:  Most recent CT chest, abdomen and pelvis 12/12/2019. 09/13/2019 PET-CT. FINDINGS: CT CHEST FINDINGS Cardiovascular: Right Port-A-Cath tip at superior caval/atrial junction. Aortic atherosclerosis. Normal heart size, without pericardial effusion. Multivessel coronary artery atherosclerosis. No central pulmonary embolism, on this non-dedicated study. Mediastinum/Nodes: No supraclavicular adenopathy. Subcarinal node of 1.4 cm on 26/2, decreased from 1.8 cm previously. No right and no definite left hilar adenopathy. Lungs/Pleura: Small left pleural effusion, increased. Centrilobular and paraseptal emphysema. Worsened presumed radiation induced fibrosis within the left paramediastinal and perihilar Kennedy. The cavitary superior segment left lower lobe Kennedy lesion is decreased in size, 2.9 x 2.0 cm on 53/7 versus 3.6 x 2.5 cm on the prior exam. Scattered pulmonary nodules are felt to be similar. Example adjacent nodules in the left upper lobe at 6 and 5 mm on 32/7. A left lower lobe 6 mm nodule on 83/7 is also not significantly changed. Musculoskeletal: No acute osseous abnormality. CT ABDOMEN PELVIS FINDINGS Hepatobiliary: Suspect  similar too small to characterize segment 2 lesions at 2-3 mm including on 46/2, favored to represent cysts. Normal gallbladder, without biliary ductal dilatation. Pancreas: Suspect pancreas divisum or variant, with a prominent dorsal duct entering the duodenum on 63/2. No duct dilatation or acute inflammation. Spleen: Normal in size, without focal abnormality. Adrenals/Urinary Tract: Normal adrenal glands. Renal vascular calcifications. No hydronephrosis. Normal urinary bladder. Stomach/Bowel: Normal stomach, without wall thickening. Colonic stool burden suggests constipation. Normal terminal ileum. Normal small bowel. Vascular/Lymphatic: Aortic atherosclerosis. No abdominopelvic adenopathy. Reproductive: Radiation seeds in the prostate. Other: No significant free fluid. Tiny fat containing right inguinal hernia. Small bowel no longer positioned within. No evidence of omental or peritoneal disease. Musculoskeletal: Lumbosacral spondylosis. Disc bulges including at L4-5 and L5-S1. IMPRESSION: 1. Response to therapy, as evidenced by decreased size of cavitary left lower lobe Kennedy lesion and mediastinal adenopathy. 2. Progressive radiation induced fibrosis within the perihilar and paramediastinal left Kennedy. Small left pleural effusion, increased. 3. Similar pulmonary metastasis. 4. No acute process or evidence of metastatic disease in the abdomen or pelvis. 5.  Possible constipation. 6. Aortic atherosclerosis (ICD10-I70.0), coronary artery atherosclerosis and emphysema (ICD10-J43.9). Electronically Signed   By: KAbigail MiyamotoM.D.   On: 03/06/2020 10:23   CT Abdomen Pelvis W Contrast  Result Date: 03/06/2020 CLINICAL DATA:  Primary Cancer Type: Kennedy Imaging Indication: Assess response to therapy Interval therapy since last imaging? Yes Initial Cancer Diagnosis Date: 08/20/2019; Established by: Biopsy-proven Detailed Pathology: Stage IV non-small cell Kennedy cancer, squamous cell carcinoma. Primary Tumor location: Left  lower lobe. Surgeries: No. Chemotherapy: Yes; Ongoing? Yes; Most recent administration: 02/19/2020 Immunotherapy?  Yes; Type: Nivolumab; Ongoing? Yes Radiation therapy? Yes; Date Range: 09/02/2019 - 09/20/2019; Target: Left Kennedy Other Cancer Therapies: Radioactive seed implant for prostate cancer 2003. EXAM: CT CHEST, ABDOMEN, AND PELVIS WITH CONTRAST TECHNIQUE: Multidetector CT imaging of the chest, abdomen and pelvis was performed following the standard protocol during bolus administration of intravenous contrast. CONTRAST:  1057mOMNIPAQUE  IOHEXOL 300 MG/ML  SOLN COMPARISON:  Most recent CT chest, abdomen and pelvis 12/12/2019. 09/13/2019 PET-CT. FINDINGS: CT CHEST FINDINGS Cardiovascular: Right Port-A-Cath tip at superior caval/atrial junction. Aortic atherosclerosis. Normal heart size, without pericardial effusion. Multivessel coronary artery atherosclerosis. No central pulmonary embolism, on this non-dedicated study. Mediastinum/Nodes: No supraclavicular adenopathy. Subcarinal node of 1.4 cm on 26/2, decreased from 1.8 cm previously. No right and no definite left hilar adenopathy. Lungs/Pleura: Small left pleural effusion, increased. Centrilobular and paraseptal emphysema. Worsened presumed radiation induced fibrosis within the left paramediastinal and perihilar Kennedy. The cavitary superior segment left lower lobe Kennedy lesion is decreased in size, 2.9 x 2.0 cm on 53/7 versus 3.6 x 2.5 cm on the prior exam. Scattered pulmonary nodules are felt to be similar. Example adjacent nodules in the left upper lobe at 6 and 5 mm on 32/7. A left lower lobe 6 mm nodule on 83/7 is also not significantly changed. Musculoskeletal: No acute osseous abnormality. CT ABDOMEN PELVIS FINDINGS Hepatobiliary: Suspect similar too small to characterize segment 2 lesions at 2-3 mm including on 46/2, favored to represent cysts. Normal gallbladder, without biliary ductal dilatation. Pancreas: Suspect pancreas divisum or variant, with a  prominent dorsal duct entering the duodenum on 63/2. No duct dilatation or acute inflammation. Spleen: Normal in size, without focal abnormality. Adrenals/Urinary Tract: Normal adrenal glands. Renal vascular calcifications. No hydronephrosis. Normal urinary bladder. Stomach/Bowel: Normal stomach, without wall thickening. Colonic stool burden suggests constipation. Normal terminal ileum. Normal small bowel. Vascular/Lymphatic: Aortic atherosclerosis. No abdominopelvic adenopathy. Reproductive: Radiation seeds in the prostate. Other: No significant free fluid. Tiny fat containing right inguinal hernia. Small bowel no longer positioned within. No evidence of omental or peritoneal disease. Musculoskeletal: Lumbosacral spondylosis. Disc bulges including at L4-5 and L5-S1. IMPRESSION: 1. Response to therapy, as evidenced by decreased size of cavitary left lower lobe Kennedy lesion and mediastinal adenopathy. 2. Progressive radiation induced fibrosis within the perihilar and paramediastinal left Kennedy. Small left pleural effusion, increased. 3. Similar pulmonary metastasis. 4. No acute process or evidence of metastatic disease in the abdomen or pelvis. 5.  Possible constipation. 6. Aortic atherosclerosis (ICD10-I70.0), coronary artery atherosclerosis and emphysema (ICD10-J43.9). Electronically Signed   By: Abigail Miyamoto M.D.   On: 03/06/2020 10:23     Assessment & Plan:   Pleuritic chest pain - New left sided pleuretic chest pain x 1 week. Unclear source. Hx left lower lobe Kennedy cancer. CXR today with no acute findings. EKG NSR, first degree AV block. Low suspicion for PE as his O2 level/HR are normal and he is not acutely short of breath. CT chest on 03/06/20 did show progressive radiation induced fibrosis within the perihilar and paramediastinal left Kennedy. Small left pleural effusion. We will trial prednisone course over three weeks to see if this help with possible pain coming from progressive fibrosis.    Martyn Ehrich, NP 03/17/2020

## 2020-03-17 NOTE — Assessment & Plan Note (Addendum)
-   New left sided pleuretic chest pain x 1 week. Unclear source. Hx left lower lobe lung cancer. CXR today with no acute findings. EKG NSR, first degree AV block. Low suspicion for PE as his O2 level/HR are normal and he is not acutely short of breath. CT chest on 03/06/20 did show progressive radiation induced fibrosis within the perihilar and paramediastinal left lung. Small left pleural effusion. We will trial prednisone course over three weeks to see if this help with possible pain coming from progressive fibrosis.

## 2020-03-17 NOTE — Patient Instructions (Addendum)
CXR today did not show any new findings that would explain left pleuritic pain. No effusion, pneumothorax or pneumonia. CT showed some radiation fibrosis but this does not typically cause pain   EKG showed normal sinus rhythm, 1st degree av block. Recommend you follow up with cardiology or PCP   We will treat radiation fibrosis with steriod course, if this does not help pain we may consider medication called lyrica for nerve pain  Orders: EKG   RX: Prednisone taper over the next 2-3 weeks  Follow-up 1 month with [place]

## 2020-03-18 ENCOUNTER — Telehealth: Payer: Self-pay | Admitting: Primary Care

## 2020-03-18 ENCOUNTER — Other Ambulatory Visit: Payer: Self-pay | Admitting: Pulmonary Disease

## 2020-03-18 DIAGNOSIS — R918 Other nonspecific abnormal finding of lung field: Secondary | ICD-10-CM

## 2020-03-18 DIAGNOSIS — R0602 Shortness of breath: Secondary | ICD-10-CM

## 2020-03-18 NOTE — Telephone Encounter (Signed)
Spoke with [place] on [address] who stated Prednisone was ready for pt pickup. Spoke with pt and notified him that medication was ready for pickup, pt stated understanding. Nothing further needed at this time.

## 2020-03-19 ENCOUNTER — Other Ambulatory Visit: Payer: Self-pay | Admitting: Otolaryngology

## 2020-03-25 ENCOUNTER — Ambulatory Visit: Payer: Medicare HMO | Admitting: Emergency Medicine

## 2020-03-25 DIAGNOSIS — J386 Stenosis of larynx: Secondary | ICD-10-CM | POA: Diagnosis not present

## 2020-03-27 ENCOUNTER — Other Ambulatory Visit (INDEPENDENT_AMBULATORY_CARE_PROVIDER_SITE_OTHER): Payer: Medicare HMO

## 2020-03-27 ENCOUNTER — Telehealth: Payer: Self-pay | Admitting: Emergency Medicine

## 2020-03-27 DIAGNOSIS — R0781 Pleurodynia: Secondary | ICD-10-CM | POA: Diagnosis not present

## 2020-03-27 LAB — BASIC METABOLIC PANEL
BUN: 23 mg/dL (ref 6–23)
CO2: 31 mEq/L (ref 19–32)
Calcium: 9.5 mg/dL (ref 8.4–10.5)
Chloride: 100 mEq/L (ref 96–112)
Creatinine, Ser: 1.15 mg/dL (ref 0.40–1.50)
GFR: 59.7 mL/min — ABNORMAL LOW (ref >60.00)
Glucose, Bld: 123 mg/dL — ABNORMAL HIGH (ref 70–99)
Potassium: 4.1 mEq/L (ref 3.5–5.1)
Sodium: 136 mEq/L (ref 135–145)

## 2020-03-27 NOTE — Telephone Encounter (Signed)
Primary Pulmonologist: Shaun Kennedy Last office visit and with whom: 03/17/2020 Shaun Kennedy What do we see them for (pulmonary problems): pleuritic chest pain Last OV assessment/plan:  Assessment & Plan:   Pleuritic chest pain - New left sided pleuretic chest pain x 1 week. Unclear source. Hx left lower lobe lung cancer. CXR today with no acute findings. EKG NSR, first degree AV block. Low suspicion for PE as his O2 level/HR are normal and he is not acutely short of breath. CT chest on 03/06/20 did show progressive radiation induced fibrosis within the perihilar and paramediastinal left lung. Small left pleural effusion. We will trial prednisone course over three weeks to see if this help with possible pain coming from progressive fibrosis.    Shaun Ehrich, NP 03/17/2020       Assessment & Plan Note by Shaun Ehrich, NP at 03/17/2020 6:10 PM  Author: Martyn Ehrich, NP Author Type: Nurse Practitioner Filed: 03/17/2020 6:18 PM  Note Status: Bernell List: Cosign Not Required Encounter Date: 03/17/2020  Problem: Pleuritic chest pain  Editor: Shaun Ehrich, NP (Nurse Practitioner)      Prior Versions: 1. Shaun Ehrich, NP (Nurse Practitioner) at 03/17/2020 6:17 PM - Edited   2. Shaun Ehrich, NP (Nurse Practitioner) at 03/17/2020 6:16 PM - Written    - New left sided pleuretic chest pain x 1 week. Unclear source. Hx left lower lobe lung cancer. CXR today with no acute findings. EKG NSR, first degree AV block. Low suspicion for PE as his O2 level/HR are normal and he is not acutely short of breath. CT chest on 03/06/20 did show progressive radiation induced fibrosis within the perihilar and paramediastinal left lung. Small left pleural effusion. We will trial prednisone course over three weeks to see if this help with possible pain coming from progressive fibrosis.        Patient Instructions by Shaun Ehrich, NP at 03/17/2020 3:30 PM  Author: Martyn Ehrich, NP Author  Type: Nurse Practitioner Filed: 03/17/2020 4:35 PM  Note Status: Addendum Cosign: Cosign Not Required Encounter Date: 03/17/2020  Editor: Shaun Ehrich, NP (Nurse Practitioner)      Prior Versions: 1. Shaun Ehrich, NP (Nurse Practitioner) at 03/17/2020 4:07 PM - Addendum   2. Shaun Ehrich, NP (Nurse Practitioner) at 03/17/2020 4:05 PM - Signed    CXR today did not show any new findings that would explain left pleuritic pain. No effusion, pneumothorax or pneumonia. CT showed some radiation fibrosis but this does not typically cause pain   EKG showed normal sinus rhythm, 1st degree av block. Recommend you follow up with cardiology or PCP   We will treat radiation fibrosis with steriod course, if this does not help pain we may consider medication called lyrica for nerve pain  Orders: EKG   RX: Prednisone taper over the next 2-3 weeks  Follow-up 1 month with Shaun Kennedy         Instructions     Return if symptoms worsen or fail to improve.  CXR today did not show any new findings that would explain left pleuritic pain. No effusion, pneumothorax or pneumonia. CT showed some radiation fibrosis but this does not typically cause pain   EKG showed normal sinus rhythm, 1st degree av block. Recommend you follow up with cardiology or PCP   We will treat radiation fibrosis with steriod course, if this does not help pain we may consider medication called lyrica for nerve pain  Orders: EKG  RX: Prednisone taper over the next 2-3 weeks  Follow-up 1 month with Shaun Kennedy       Reason for call: Sharp pain on left pain for 2-3 weeks.  SOB with pain.  On prednisone taper, no difference on prednisone.  He is requesting something stronger for the pain.  Denies any coughing or any other symptoms.  Beth, please advise.  Thank you.  (examples of things to ask: : When did symptoms start? Fever? Cough? Productive? Color to sputum? More sputum than usual? Wheezing? Have you needed  increased oxygen? Are you taking your respiratory medications? What over the counter measures have you tried?)  No Known Allergies  Immunization History  Administered Date(s) Administered  . Fluad Quad(high Dose 65+) 10/17/2018, 10/28/2019  . Influenza, High Dose Seasonal PF 12/02/2015, 12/12/2016, 11/02/2017  . PFIZER(Purple Top)SARS-COV-2 Vaccination 04/05/2019, 05/01/2019, 10/28/2019  . Pneumococcal Conjugate-13 06/25/2014  . Pneumococcal Polysaccharide-23 06/03/2010  . Tdap 12/06/2007, 10/31/2019  . Zoster 02/08/2008  . Zoster Recombinat (Shingrix) 02/25/2017, 04/26/2017

## 2020-03-27 NOTE — Telephone Encounter (Signed)
Patient's wife called back, [name], listed on DPR, advised on recommendations per Derl Barrow NP.  States there is no rash, there has been no fall or injury.  He has tried Aleve, 1 tab on yesterday and 1 tab on the day prior.  Advised to come to the office for labs, labs ordered.

## 2020-03-27 NOTE — Telephone Encounter (Signed)
ATC x1.  LVM to return call. 

## 2020-03-27 NOTE — Telephone Encounter (Signed)
Does he have a rash on his left side? Has he tried tylenol or ibuprofen? Did he fall or strain his muscle in any way?  Does he have a rescue inhaler?   I would also have him reach our to his oncologist as he has lung cancer and pain could be coming from this.   Please have him get bmet and D-dimer.

## 2020-04-01 ENCOUNTER — Inpatient Hospital Stay (HOSPITAL_BASED_OUTPATIENT_CLINIC_OR_DEPARTMENT_OTHER): Payer: Medicare HMO | Admitting: Internal Medicine

## 2020-04-01 ENCOUNTER — Inpatient Hospital Stay: Payer: Medicare HMO

## 2020-04-01 ENCOUNTER — Other Ambulatory Visit: Payer: Self-pay

## 2020-04-01 ENCOUNTER — Encounter: Payer: Self-pay | Admitting: Internal Medicine

## 2020-04-01 VITALS — BP 125/60 | HR 66 | Temp 97.7°F | Resp 14 | Ht 67.0 in | Wt 177.2 lb

## 2020-04-01 DIAGNOSIS — E119 Type 2 diabetes mellitus without complications: Secondary | ICD-10-CM | POA: Diagnosis not present

## 2020-04-01 DIAGNOSIS — E785 Hyperlipidemia, unspecified: Secondary | ICD-10-CM | POA: Diagnosis not present

## 2020-04-01 DIAGNOSIS — I10 Essential (primary) hypertension: Secondary | ICD-10-CM | POA: Diagnosis not present

## 2020-04-01 DIAGNOSIS — Z5112 Encounter for antineoplastic immunotherapy: Secondary | ICD-10-CM | POA: Diagnosis not present

## 2020-04-01 DIAGNOSIS — C3432 Malignant neoplasm of lower lobe, left bronchus or lung: Secondary | ICD-10-CM

## 2020-04-01 DIAGNOSIS — I2510 Atherosclerotic heart disease of native coronary artery without angina pectoris: Secondary | ICD-10-CM | POA: Diagnosis not present

## 2020-04-01 DIAGNOSIS — Z85038 Personal history of other malignant neoplasm of large intestine: Secondary | ICD-10-CM | POA: Diagnosis not present

## 2020-04-01 DIAGNOSIS — C3490 Malignant neoplasm of unspecified part of unspecified bronchus or lung: Secondary | ICD-10-CM

## 2020-04-01 DIAGNOSIS — Z8546 Personal history of malignant neoplasm of prostate: Secondary | ICD-10-CM | POA: Diagnosis not present

## 2020-04-01 DIAGNOSIS — Z95828 Presence of other vascular implants and grafts: Secondary | ICD-10-CM

## 2020-04-01 DIAGNOSIS — Z87891 Personal history of nicotine dependence: Secondary | ICD-10-CM | POA: Diagnosis not present

## 2020-04-01 LAB — CMP (CANCER CENTER ONLY)
ALT: 12 U/L (ref 0–44)
AST: 13 U/L — ABNORMAL LOW (ref 15–41)
Albumin: 3.4 g/dL — ABNORMAL LOW (ref 3.5–5.0)
Alkaline Phosphatase: 51 U/L (ref 38–126)
Anion gap: 7 (ref 5–15)
BUN: 20 mg/dL (ref 8–23)
CO2: 29 mmol/L (ref 22–32)
Calcium: 8.8 mg/dL — ABNORMAL LOW (ref 8.9–10.3)
Chloride: 104 mmol/L (ref 98–111)
Creatinine: 1.17 mg/dL (ref 0.61–1.24)
GFR, Estimated: >60 mL/min (ref >60)
Glucose, Bld: 97 mg/dL (ref 70–99)
Potassium: 4 mmol/L (ref 3.5–5.1)
Sodium: 140 mmol/L (ref 135–145)
Total Bilirubin: 0.5 mg/dL (ref 0.3–1.2)
Total Protein: 7 g/dL (ref 6.5–8.1)

## 2020-04-01 LAB — CBC WITH DIFFERENTIAL (CANCER CENTER ONLY)
Abs Immature Granulocytes: 0.03 10*3/uL (ref 0.00–0.07)
Basophils Absolute: 0 10*3/uL (ref 0.0–0.1)
Basophils Relative: 0 %
Eosinophils Absolute: 0.2 10*3/uL (ref 0.0–0.5)
Eosinophils Relative: 2 %
HCT: 31.8 % — ABNORMAL LOW (ref 39.0–52.0)
Hemoglobin: 10.4 g/dL — ABNORMAL LOW (ref 13.0–17.0)
Immature Granulocytes: 0 %
Lymphocytes Relative: 23 %
Lymphs Abs: 1.6 10*3/uL (ref 0.7–4.0)
MCH: 30.2 pg (ref 26.0–34.0)
MCHC: 32.7 g/dL (ref 30.0–36.0)
MCV: 92.4 fL (ref 80.0–100.0)
Monocytes Absolute: 0.7 10*3/uL (ref 0.1–1.0)
Monocytes Relative: 10 %
Neutro Abs: 4.3 10*3/uL (ref 1.7–7.7)
Neutrophils Relative %: 65 %
Platelet Count: 243 10*3/uL (ref 150–400)
RBC: 3.44 MIL/uL — ABNORMAL LOW (ref 4.22–5.81)
RDW: 14.1 % (ref 11.5–15.5)
WBC Count: 6.8 10*3/uL (ref 4.0–10.5)
nRBC: 0 % (ref 0.0–0.2)

## 2020-04-01 LAB — TSH: TSH: 0.86 u[IU]/mL (ref 0.320–4.118)

## 2020-04-01 MED ORDER — SODIUM CHLORIDE 0.9% FLUSH
10.0000 mL | INTRAVENOUS | Status: DC | PRN
  Administered 2020-04-01: 10 mL
  Filled 2020-04-01: qty 10

## 2020-04-01 MED ORDER — HEPARIN SOD (PORK) LOCK FLUSH 100 UNIT/ML IV SOLN
500.0000 [IU] | Freq: Once | INTRAVENOUS | Status: AC | PRN
  Administered 2020-04-01: 500 [IU]
  Filled 2020-04-01: qty 5

## 2020-04-01 MED ORDER — FAMOTIDINE IN NACL 20-0.9 MG/50ML-% IV SOLN
INTRAVENOUS | Status: AC
  Filled 2020-04-01: qty 50

## 2020-04-01 MED ORDER — SODIUM CHLORIDE 0.9% FLUSH
10.0000 mL | Freq: Once | INTRAVENOUS | Status: AC
  Administered 2020-04-01: 10 mL
  Filled 2020-04-01: qty 10

## 2020-04-01 MED ORDER — SODIUM CHLORIDE 0.9 % IV SOLN
360.0000 mg | Freq: Once | INTRAVENOUS | Status: AC
  Administered 2020-04-01: 360 mg via INTRAVENOUS
  Filled 2020-04-01: qty 16

## 2020-04-01 MED ORDER — FAMOTIDINE IN NACL 20-0.9 MG/50ML-% IV SOLN
20.0000 mg | Freq: Once | INTRAVENOUS | Status: AC
  Administered 2020-04-01: 20 mg via INTRAVENOUS

## 2020-04-01 MED ORDER — SODIUM CHLORIDE 0.9 % IV SOLN
Freq: Once | INTRAVENOUS | Status: AC
  Filled 2020-04-01: qty 250

## 2020-04-01 MED ORDER — IPILIMUMAB CHEMO INJECTION 200 MG/40ML
1.0000 mg/kg | Freq: Once | INTRAVENOUS | Status: AC
Start: 2020-04-01 — End: 2020-04-01
  Administered 2020-04-01: 70 mg via INTRAVENOUS
  Filled 2020-04-01: qty 14

## 2020-04-01 MED ORDER — DIPHENHYDRAMINE HCL 50 MG/ML IJ SOLN
25.0000 mg | Freq: Once | INTRAMUSCULAR | Status: AC
  Administered 2020-04-01: 25 mg via INTRAVENOUS

## 2020-04-01 MED ORDER — DIPHENHYDRAMINE HCL 50 MG/ML IJ SOLN
INTRAMUSCULAR | Status: AC
  Filled 2020-04-01: qty 1

## 2020-04-01 NOTE — Progress Notes (Signed)
    [facility] [phone]   [phone]  OFFICE PROGRESS NOTE  Lalonde, John C, MD [address] [place] [address]  DIAGNOSIS: stage IV (T3, N2, M1 a) non-small cell lung cancer, squamous cell carcinoma presented with obstructive left lower lobe lung mass in addition to mediastinal lymphadenopathy as well as bilateral pulmonary nodules diagnosed in July 2021.  PDL1: 0%  PRIOR THERAPY: None  CURRENT THERAPY: 1) Palliative radiotherapy to the obstructive left lower lobe lung mass under the care of Dr. Moody.  2)  systemic chemotherapy 2 cycles of chemotherapy with carboplatin for an AUC of 5 and paclitaxel 175 mg/m2 in addition to immunotherapy with nivolumab 360 mg every 3 weeks and ipilimumab 1 mg/kg IV every 6 weeks followed by maintenance nivolumab and ipilimumab.  He started the first treatment on 09/04/2019.  He is status post 5 cycles of the treatment.  INTERVAL HISTORY: Shaun Kennedy 80 y.o. male returns to the clinic today for follow-up visit.  The patient is feeling fine today with no concerning complaints except for left rib pain started around 2 weeks ago.  He was started recently on prednisone taper by his primary care physician and he almost completed this course.  He denied having any current shortness of breath, cough or hemoptysis.  He denied having any fever or chills.  He has no nausea, vomiting, diarrhea or constipation.  He denied having any headache or visual changes.  He has no significant weight loss or night sweats.  He is here today for evaluation before starting day 1 of cycle #6.  MEDICAL HISTORY: Past Medical History:  Diagnosis Date  . Allergic rhinitis   . Asthma   . Carotid stenosis   . Colon cancer (HCC) 2003  . Diabetes mellitus (HCC)   . Diverticulosis   . Dyslipidemia   . ED (erectile dysfunction)   . GERD (gastroesophageal reflux disease)   . H/O degenerative disc disease   . Hemorrhoids   .  HTN (hypertension)   . Hyperlipidemia   . Lung cancer (HCC)   . LVH (left ventricular hypertrophy)    on EKG  . Mass of lower lobe of left lung   . Mediastinal adenopathy   . Smoker    former  . Wears dentures   . Wears dentures   . Wears glasses   . Wears glasses     ALLERGIES:  has No Known Allergies.  MEDICATIONS:  Current Outpatient Medications  Medication Sig Dispense Refill  . amLODipine (NORVASC) 5 MG tablet TAKE 1 TABLET BY MOUTH EVERY DAY (Patient taking differently: Take 5 mg by mouth daily.) 90 tablet 3  . aspirin EC 81 MG tablet Take 81 mg by mouth daily after breakfast.     . Blood Glucose Monitoring Suppl (ONETOUCH VERIO) w/Device KIT USE TO CHECK SUGAR DAILY 1 kit 0  . cholecalciferol (VITAMIN D) 1000 units tablet Take 1,000 Units by mouth daily after breakfast.     . cilostazol (PLETAL) 100 MG tablet Take 100 mg by mouth 2 (two) times daily.    . dronabinol (MARINOL) 2.5 MG capsule TAKE 1 CAPSULE BY MOUTH 2 TIMES DAILY BEFORE A MEAL. 30 capsule 1  . ferrous sulfate 325 (65 FE) MG EC tablet Take 1 tablet (325 mg total) by mouth daily with breakfast. 30 tablet 12  . gabapentin (NEURONTIN) 100 MG capsule Take 1 capsule (100 mg total) by mouth 3 (three) times daily. 90 capsule 1  .   guaiFENesin-codeine 100-10 MG/5ML syrup TAKE 5ML BY MOUTH 3 TIMES A DAY AS NEEDED FOR COUGH 240 mL 0  . Lancets (ONETOUCH DELICA PLUS LANCET33G) MISC PATIENT IS TO TEST TWO TIMES A DAY DX: E11.9 (ONETOUCH VERIO FLEX) 100 each 12  . lidocaine-prilocaine (EMLA) cream Apply to the Port-A-Cath site 30-60-minute before chemotherapy. 30 g 0  . LINZESS 72 MCG capsule Take 72 mcg by mouth every morning.    . lisinopril-hydrochlorothiazide (ZESTORETIC) 20-12.5 MG tablet TAKE 1 TABLET BY MOUTH EVERY DAY (Patient taking differently: Take 1 tablet by mouth daily.) 90 tablet 2  . metFORMIN (GLUCOPHAGE-XR) 500 MG 24 hr tablet TAKE 1 TABLET BY MOUTH EVERY DAY 90 tablet 1  . mirtazapine (REMERON) 15 MG tablet  TAKE 1 TABLET BY MOUTH EVERYDAY AT BEDTIME 90 tablet 1  . Multiple Vitamin (MULTIVITAMIN WITH MINERALS) TABS tablet Take 1 tablet by mouth daily after breakfast.     . ONETOUCH VERIO test strip USE AS INSTRUCTED TO CHECK TWICE DAILY 200 strip 5  . predniSONE (DELTASONE) 20 MG tablet Take 2 tablets (40 mg total) by mouth daily with breakfast for 7 days, THEN 1 tablet (20 mg total) daily with breakfast for 7 days, THEN 0.5 tablets (10 mg total) daily with breakfast for 7 days. 30 tablet 0  . simvastatin (ZOCOR) 40 MG tablet TAKE 1 TABLET BY MOUTH EVERY DAY 90 tablet 3   No current facility-administered medications for this visit.   Facility-Administered Medications Ordered in Other Visits  Medication Dose Route Frequency Provider Last Rate Last Admin  . sodium chloride flush (NS) 0.9 % injection 10 mL  10 mL Intracatheter PRN , , MD   10 mL at 09/04/19 1556    SURGICAL HISTORY:  Past Surgical History:  Procedure Laterality Date  . BRONCHIAL BIOPSY  08/20/2019   Procedure: BRONCHIAL BIOPSIES;  Surgeon: Byrum, Robert S, MD;  Location: [facility];  Service: Pulmonary;;  . BRONCHIAL BRUSHINGS  08/20/2019   Procedure: BRONCHIAL BRUSHINGS;  Surgeon: Byrum, Robert S, MD;  Location: [facility];  Service: Pulmonary;;  . BRONCHIAL NEEDLE ASPIRATION BIOPSY  08/20/2019   Procedure: BRONCHIAL NEEDLE ASPIRATION BIOPSIES;  Surgeon: Byrum, Robert S, MD;  Location: [facility];  Service: Pulmonary;;  . CARPAL TUNNEL RELEASE Right 01/09/2019   Procedure: CARPAL TUNNEL RELEASE;  Surgeon: Xu, Naiping M, MD;  Location: [facility];  Service: Orthopedics;  Laterality: Right;  . CATARACT EXTRACTION Right 2018  . COLONOSCOPY  2007   Dr. Hung  . I & D EXTREMITY Right 04/02/2016   Procedure: IRRIGATION AND DEBRIDEMENT GREAT TOE;  Surgeon: Naiping M Xu, MD;  Location: [facility];  Service: Orthopedics;  Laterality: Right;  . IR IMAGING GUIDED PORT INSERTION  08/30/2019  . MULTIPLE TOOTH  EXTRACTIONS    . RADIOACTIVE SEED IMPLANT  2003  . ULNAR TUNNEL RELEASE Right 01/09/2019   Procedure: RIGHT CUBITAL TUNNEL RELEASE AND CARPAL TUNNEL RELEASE;  Surgeon: Xu, Naiping M, MD;  Location: [facility];  Service: Orthopedics;  Laterality: Right;  . UPPER GASTROINTESTINAL ENDOSCOPY    . VIDEO BRONCHOSCOPY N/A 12/18/2019   Procedure: VIDEO BRONCHOSCOPY WITHOUT FLUORO;  Surgeon: Byrum, Robert S, MD;  Location: [facility];  Service: Cardiopulmonary;  Laterality: N/A;  . VIDEO BRONCHOSCOPY WITH ENDOBRONCHIAL ULTRASOUND N/A 08/20/2019   Procedure: VIDEO BRONCHOSCOPY WITH ENDOBRONCHIAL ULTRASOUND;  Surgeon: Byrum, Robert S, MD;  Location: [facility];  Service: Pulmonary;  Laterality: N/A;    REVIEW OF SYSTEMS:  A comprehensive review of systems   was negative except for: Constitutional: positive for fatigue Respiratory: positive for pleurisy/chest pain   PHYSICAL EXAMINATION: General appearance: alert, cooperative, fatigued and no distress Head: Normocephalic, without obvious abnormality, atraumatic Neck: no adenopathy, no JVD, supple, symmetrical, trachea midline and thyroid not enlarged, symmetric, no tenderness/mass/nodules Lymph nodes: Cervical, supraclavicular, and axillary nodes normal. Resp: clear to auscultation bilaterally Back: symmetric, no curvature. ROM normal. No CVA tenderness. Cardio: regular rate and rhythm, S1, S2 normal, no murmur, click, rub or gallop GI: soft, non-tender; bowel sounds normal; no masses,  no organomegaly Extremities: extremities normal, atraumatic, no cyanosis or edema  ECOG PERFORMANCE STATUS: 1 - Symptomatic but completely ambulatory  Blood pressure 125/60, pulse 66, temperature 97.7 F (36.5 C), temperature source Tympanic, resp. rate 14, height 5' 7" (1.702 m), weight 177 lb 3.2 oz (80.4 kg), SpO2 100 %.  LABORATORY DATA: Lab Results  Component Value Date   WBC 6.8 04/01/2020   HGB 10.4 (L) 04/01/2020   HCT 31.8 (L) 04/01/2020    MCV 92.4 04/01/2020   PLT 243 04/01/2020      Chemistry      Component Value Date/Time   NA 136 03/27/2020 1509   NA 132 (L) 08/14/2019 0918   K 4.1 03/27/2020 1509   CL 100 03/27/2020 1509   CO2 31 03/27/2020 1509   BUN 23 03/27/2020 1509   BUN 16 08/14/2019 0918   CREATININE 1.15 03/27/2020 1509   CREATININE 1.17 03/11/2020 1415   CREATININE 1.25 (H) 08/08/2016 0912      Component Value Date/Time   CALCIUM 9.5 03/27/2020 1509   ALKPHOS 50 03/11/2020 1415   AST 18 03/11/2020 1415   ALT 14 03/11/2020 1415   BILITOT 0.8 03/11/2020 1415       RADIOGRAPHIC STUDIES: DG Chest 2 View  Result Date: 03/17/2020 CLINICAL DATA:  Stage IV left lung cancer, pain, short of breath EXAM: CHEST - 2 VIEW COMPARISON:  03/06/2020, 11/06/2019 FINDINGS: Frontal and lateral views of the chest demonstrates stable right chest wall port tip overlying superior vena cava. The cardiac silhouette is unremarkable. Left perihilar consolidation not appreciably changed since recent staging CT evaluation. Chronic volume loss left hemithorax. No effusion or pneumothorax. No acute bony abnormalities. IMPRESSION: 1. Chronic left perihilar consolidation consistent with known lung cancer. No significant change since recent CT. Electronically Signed   By: Michael  Brown M.D.   On: 03/17/2020 15:42   CT Chest W Contrast  Result Date: 03/06/2020 CLINICAL DATA:  Primary Cancer Type: Lung Imaging Indication: Assess response to therapy Interval therapy since last imaging? Yes Initial Cancer Diagnosis Date: 08/20/2019; Established by: Biopsy-proven Detailed Pathology: Stage IV non-small cell lung cancer, squamous cell carcinoma. Primary Tumor location: Left lower lobe. Surgeries: No. Chemotherapy: Yes; Ongoing? Yes; Most recent administration: 02/19/2020 Immunotherapy?  Yes; Type: Nivolumab; Ongoing? Yes Radiation therapy? Yes; Date Range: 09/02/2019 - 09/20/2019; Target: Left lung Other Cancer Therapies: Radioactive seed  implant for prostate cancer 2003. EXAM: CT CHEST, ABDOMEN, AND PELVIS WITH CONTRAST TECHNIQUE: Multidetector CT imaging of the chest, abdomen and pelvis was performed following the standard protocol during bolus administration of intravenous contrast. CONTRAST:  100mL OMNIPAQUE IOHEXOL 300 MG/ML  SOLN COMPARISON:  Most recent CT chest, abdomen and pelvis 12/12/2019. 09/13/2019 PET-CT. FINDINGS: CT CHEST FINDINGS Cardiovascular: Right Port-A-Cath tip at superior caval/atrial junction. Aortic atherosclerosis. Normal heart size, without pericardial effusion. Multivessel coronary artery atherosclerosis. No central pulmonary embolism, on this non-dedicated study. Mediastinum/Nodes: No supraclavicular adenopathy. Subcarinal node of 1.4 cm on 26/2, decreased from   1.8 cm previously. No right and no definite left hilar adenopathy. Lungs/Pleura: Small left pleural effusion, increased. Centrilobular and paraseptal emphysema. Worsened presumed radiation induced fibrosis within the left paramediastinal and perihilar lung. The cavitary superior segment left lower lobe lung lesion is decreased in size, 2.9 x 2.0 cm on 53/7 versus 3.6 x 2.5 cm on the prior exam. Scattered pulmonary nodules are felt to be similar. Example adjacent nodules in the left upper lobe at 6 and 5 mm on 32/7. A left lower lobe 6 mm nodule on 83/7 is also not significantly changed. Musculoskeletal: No acute osseous abnormality. CT ABDOMEN PELVIS FINDINGS Hepatobiliary: Suspect similar too small to characterize segment 2 lesions at 2-3 mm including on 46/2, favored to represent cysts. Normal gallbladder, without biliary ductal dilatation. Pancreas: Suspect pancreas divisum or variant, with a prominent dorsal duct entering the duodenum on 63/2. No duct dilatation or acute inflammation. Spleen: Normal in size, without focal abnormality. Adrenals/Urinary Tract: Normal adrenal glands. Renal vascular calcifications. No hydronephrosis. Normal urinary bladder.  Stomach/Bowel: Normal stomach, without wall thickening. Colonic stool burden suggests constipation. Normal terminal ileum. Normal small bowel. Vascular/Lymphatic: Aortic atherosclerosis. No abdominopelvic adenopathy. Reproductive: Radiation seeds in the prostate. Other: No significant free fluid. Tiny fat containing right inguinal hernia. Small bowel no longer positioned within. No evidence of omental or peritoneal disease. Musculoskeletal: Lumbosacral spondylosis. Disc bulges including at L4-5 and L5-S1. IMPRESSION: 1. Response to therapy, as evidenced by decreased size of cavitary left lower lobe lung lesion and mediastinal adenopathy. 2. Progressive radiation induced fibrosis within the perihilar and paramediastinal left lung. Small left pleural effusion, increased. 3. Similar pulmonary metastasis. 4. No acute process or evidence of metastatic disease in the abdomen or pelvis. 5.  Possible constipation. 6. Aortic atherosclerosis (ICD10-I70.0), coronary artery atherosclerosis and emphysema (ICD10-J43.9). Electronically Signed   By: Abigail Miyamoto M.D.   On: 03/06/2020 10:23   CT Abdomen Pelvis W Contrast  Result Date: 03/06/2020 CLINICAL DATA:  Primary Cancer Type: Lung Imaging Indication: Assess response to therapy Interval therapy since last imaging? Yes Initial Cancer Diagnosis Date: 08/20/2019; Established by: Biopsy-proven Detailed Pathology: Stage IV non-small cell lung cancer, squamous cell carcinoma. Primary Tumor location: Left lower lobe. Surgeries: No. Chemotherapy: Yes; Ongoing? Yes; Most recent administration: 02/19/2020 Immunotherapy?  Yes; Type: Nivolumab; Ongoing? Yes Radiation therapy? Yes; Date Range: 09/02/2019 - 09/20/2019; Target: Left lung Other Cancer Therapies: Radioactive seed implant for prostate cancer 2003. EXAM: CT CHEST, ABDOMEN, AND PELVIS WITH CONTRAST TECHNIQUE: Multidetector CT imaging of the chest, abdomen and pelvis was performed following the standard protocol during bolus  administration of intravenous contrast. CONTRAST:  133m OMNIPAQUE IOHEXOL 300 MG/ML  SOLN COMPARISON:  Most recent CT chest, abdomen and pelvis 12/12/2019. 09/13/2019 PET-CT. FINDINGS: CT CHEST FINDINGS Cardiovascular: Right Port-A-Cath tip at superior caval/atrial junction. Aortic atherosclerosis. Normal heart size, without pericardial effusion. Multivessel coronary artery atherosclerosis. No central pulmonary embolism, on this non-dedicated study. Mediastinum/Nodes: No supraclavicular adenopathy. Subcarinal node of 1.4 cm on 26/2, decreased from 1.8 cm previously. No right and no definite left hilar adenopathy. Lungs/Pleura: Small left pleural effusion, increased. Centrilobular and paraseptal emphysema. Worsened presumed radiation induced fibrosis within the left paramediastinal and perihilar lung. The cavitary superior segment left lower lobe lung lesion is decreased in size, 2.9 x 2.0 cm on 53/7 versus 3.6 x 2.5 cm on the prior exam. Scattered pulmonary nodules are felt to be similar. Example adjacent nodules in the left upper lobe at 6 and 5 mm on 32/7. A left lower lobe  6 mm nodule on 83/7 is also not significantly changed. Musculoskeletal: No acute osseous abnormality. CT ABDOMEN PELVIS FINDINGS Hepatobiliary: Suspect similar too small to characterize segment 2 lesions at 2-3 mm including on 46/2, favored to represent cysts. Normal gallbladder, without biliary ductal dilatation. Pancreas: Suspect pancreas divisum or variant, with a prominent dorsal duct entering the duodenum on 63/2. No duct dilatation or acute inflammation. Spleen: Normal in size, without focal abnormality. Adrenals/Urinary Tract: Normal adrenal glands. Renal vascular calcifications. No hydronephrosis. Normal urinary bladder. Stomach/Bowel: Normal stomach, without wall thickening. Colonic stool burden suggests constipation. Normal terminal ileum. Normal small bowel. Vascular/Lymphatic: Aortic atherosclerosis. No abdominopelvic adenopathy.  Reproductive: Radiation seeds in the prostate. Other: No significant free fluid. Tiny fat containing right inguinal hernia. Small bowel no longer positioned within. No evidence of omental or peritoneal disease. Musculoskeletal: Lumbosacral spondylosis. Disc bulges including at L4-5 and L5-S1. IMPRESSION: 1. Response to therapy, as evidenced by decreased size of cavitary left lower lobe lung lesion and mediastinal adenopathy. 2. Progressive radiation induced fibrosis within the perihilar and paramediastinal left lung. Small left pleural effusion, increased. 3. Similar pulmonary metastasis. 4. No acute process or evidence of metastatic disease in the abdomen or pelvis. 5.  Possible constipation. 6. Aortic atherosclerosis (ICD10-I70.0), coronary artery atherosclerosis and emphysema (ICD10-J43.9). Electronically Signed   By: Kyle  Talbot M.D.   On: 03/06/2020 10:23    ASSESSMENT AND PLAN: This is a very pleasant 79 years old [name] with stage IV non-small cell lung cancer, squamous cell carcinoma with negative PD-L1 expression. The patient is currently undergoing systemic chemotherapy with carboplatin and paclitaxel for 2 cycles in addition to immunotherapy with ipilimumab 1 mg/KG every 6 weeks and nivolumab 360 mg IV every 3 weeks status post 5 cycles.   The patient continues to tolerate this treatment well with no concerning adverse effects. I recommended for him to proceed with day 1 of cycle #6 today as planned. I will see him back for follow-up visit in 3 weeks for evaluation before day 22 of cycle #6. For the left rib pain, he will continue to monitor this for now and if no improvement will consider the patient for chest x-ray or other imaging studies. He was advised to call immediately if he has any other concerning symptoms in the interval. The patient voices understanding of current disease status and treatment options and is in agreement with the current care plan.  All questions  were answered. The patient knows to call the clinic with any problems, questions or concerns. We can certainly see the patient much sooner if necessary.  Disclaimer: This note was dictated with voice recognition software. Similar sounding words can inadvertently be transcribed and may not be corrected upon review.       

## 2020-04-01 NOTE — Patient Instructions (Signed)

## 2020-04-01 NOTE — Patient Instructions (Signed)
[facility] Discharge Instructions for Patients Receiving Chemotherapy  Today you received the following chemotherapy agents: Rae Halsted  To help prevent nausea and vomiting after your treatment, we encourage you to take your nausea medication as directed.    If you develop nausea and vomiting that is not controlled by your nausea medication, call the clinic.   BELOW ARE SYMPTOMS THAT SHOULD BE REPORTED IMMEDIATELY:  *FEVER GREATER THAN 100.5 F  *CHILLS WITH OR WITHOUT FEVER  NAUSEA AND VOMITING THAT IS NOT CONTROLLED WITH YOUR NAUSEA MEDICATION  *UNUSUAL SHORTNESS OF BREATH  *UNUSUAL BRUISING OR BLEEDING  TENDERNESS IN MOUTH AND THROAT WITH OR WITHOUT PRESENCE OF ULCERS  *URINARY PROBLEMS  *BOWEL PROBLEMS  UNUSUAL RASH Items with * indicate a potential emergency and should be followed up as soon as possible.  Feel free to call the clinic should you have any questions or concerns. The clinic phone number is [phone].  Please show the [facility] at check-in to the Emergency Department and triage nurse.

## 2020-04-03 ENCOUNTER — Telehealth: Payer: Self-pay

## 2020-04-03 NOTE — Telephone Encounter (Signed)
Pt. Called requesting a refill on his Amlodipine, Lisinopril, Simvastatin, Metformin, and ferrous sul. To there [place] pt. Last apt 02/12/20 and next apt. 04/27/20.

## 2020-04-07 ENCOUNTER — Other Ambulatory Visit: Payer: Self-pay

## 2020-04-07 ENCOUNTER — Other Ambulatory Visit (HOSPITAL_COMMUNITY)
Admission: RE | Admit: 2020-04-07 | Discharge: 2020-04-07 | Disposition: A | Discharge status: Home / Self Care | Payer: Medicare HMO | Source: Ambulatory Visit | Attending: Otolaryngology | Admitting: Otolaryngology

## 2020-04-07 DIAGNOSIS — E1169 Type 2 diabetes mellitus with other specified complication: Secondary | ICD-10-CM

## 2020-04-07 DIAGNOSIS — Z930 Tracheostomy status: Secondary | ICD-10-CM | POA: Diagnosis not present

## 2020-04-07 DIAGNOSIS — J383 Other diseases of vocal cords: Secondary | ICD-10-CM | POA: Diagnosis not present

## 2020-04-07 DIAGNOSIS — C3432 Malignant neoplasm of lower lobe, left bronchus or lung: Secondary | ICD-10-CM | POA: Diagnosis present

## 2020-04-07 DIAGNOSIS — E785 Hyperlipidemia, unspecified: Secondary | ICD-10-CM | POA: Diagnosis present

## 2020-04-07 DIAGNOSIS — I6529 Occlusion and stenosis of unspecified carotid artery: Secondary | ICD-10-CM | POA: Diagnosis present

## 2020-04-07 DIAGNOSIS — J386 Stenosis of larynx: Secondary | ICD-10-CM | POA: Diagnosis present

## 2020-04-07 DIAGNOSIS — Z7984 Long term (current) use of oral hypoglycemic drugs: Secondary | ICD-10-CM | POA: Diagnosis not present

## 2020-04-07 DIAGNOSIS — Z87891 Personal history of nicotine dependence: Secondary | ICD-10-CM | POA: Diagnosis not present

## 2020-04-07 DIAGNOSIS — E119 Type 2 diabetes mellitus without complications: Secondary | ICD-10-CM

## 2020-04-07 DIAGNOSIS — E1121 Type 2 diabetes mellitus with diabetic nephropathy: Secondary | ICD-10-CM | POA: Diagnosis not present

## 2020-04-07 DIAGNOSIS — Z79899 Other long term (current) drug therapy: Secondary | ICD-10-CM | POA: Diagnosis not present

## 2020-04-07 DIAGNOSIS — Z833 Family history of diabetes mellitus: Secondary | ICD-10-CM | POA: Diagnosis not present

## 2020-04-07 DIAGNOSIS — Z7982 Long term (current) use of aspirin: Secondary | ICD-10-CM | POA: Diagnosis not present

## 2020-04-07 DIAGNOSIS — Z20822 Contact with and (suspected) exposure to covid-19: Secondary | ICD-10-CM | POA: Insufficient documentation

## 2020-04-07 DIAGNOSIS — Z85038 Personal history of other malignant neoplasm of large intestine: Secondary | ICD-10-CM | POA: Diagnosis not present

## 2020-04-07 DIAGNOSIS — I10 Essential (primary) hypertension: Secondary | ICD-10-CM | POA: Diagnosis present

## 2020-04-07 DIAGNOSIS — K219 Gastro-esophageal reflux disease without esophagitis: Secondary | ICD-10-CM | POA: Diagnosis present

## 2020-04-07 DIAGNOSIS — Z8249 Family history of ischemic heart disease and other diseases of the circulatory system: Secondary | ICD-10-CM | POA: Diagnosis not present

## 2020-04-07 DIAGNOSIS — Z01812 Encounter for preprocedural laboratory examination: Secondary | ICD-10-CM | POA: Insufficient documentation

## 2020-04-07 DIAGNOSIS — E1159 Type 2 diabetes mellitus with other circulatory complications: Secondary | ICD-10-CM

## 2020-04-07 DIAGNOSIS — Z823 Family history of stroke: Secondary | ICD-10-CM | POA: Diagnosis not present

## 2020-04-07 DIAGNOSIS — R531 Weakness: Secondary | ICD-10-CM

## 2020-04-07 DIAGNOSIS — I152 Hypertension secondary to endocrine disorders: Secondary | ICD-10-CM

## 2020-04-07 DIAGNOSIS — Z801 Family history of malignant neoplasm of trachea, bronchus and lung: Secondary | ICD-10-CM | POA: Diagnosis not present

## 2020-04-07 LAB — SARS CORONAVIRUS 2 (TAT 6-24 HRS): SARS Coronavirus 2: NEGATIVE

## 2020-04-07 MED ORDER — METFORMIN HCL ER 500 MG PO TB24: 500.0000 mg | ORAL_TABLET | Freq: Every day | ORAL | 1 refills | Status: DC

## 2020-04-07 MED ORDER — SIMVASTATIN 40 MG PO TABS: 40.0000 mg | ORAL_TABLET | Freq: Every day | ORAL | 3 refills | Status: DC

## 2020-04-07 MED ORDER — FERROUS SULFATE 325 (65 FE) MG PO TBEC: 325.0000 mg | DELAYED_RELEASE_TABLET | Freq: Every day | ORAL | 12 refills | Status: DC

## 2020-04-07 MED ORDER — LISINOPRIL-HYDROCHLOROTHIAZIDE 20-12.5 MG PO TABS: 1.0000 | ORAL_TABLET | Freq: Every day | ORAL | 2 refills | Status: DC

## 2020-04-07 MED ORDER — AMLODIPINE BESYLATE 5 MG PO TABS: 5.0000 mg | ORAL_TABLET | Freq: Every day | ORAL | 3 refills | Status: DC

## 2020-04-07 NOTE — Telephone Encounter (Signed)
Done KH 

## 2020-04-09 NOTE — Progress Notes (Addendum)
I was unable to reach Shaun Kennedy by phone.  I left  A message on voice mail.  I instructed the patient to arrive at [facility] entrance at 0830  , register in the [facility]. DO NOT eat or drink anything after midnight.  I instructed the patient to take the following medications in the am with just enough water to get them down:  Amlodipine; prn Guaifenesin - codiene I asked patient to shower with antibacteria soup, dry off with a clean towel,not wear any lotions, powders, cologne, jewelry, piercing,  polish.  Wear clean clothes. Brush teeth.,I instructed patient to check CBG after awaking and every 2 hours until arrival  to the hospital.  I Instructed patient if CBG is less than 70 to drink Gel or 1/2 cup of a clear juice. Recheck CBG in 15 minutes then call pre- op desk at [phone], if CBG is not over 70. for further instructions. If scheduled to receive Insulin, do not take Insulin I instructed  patient to call [phone], in the am if there were any questions or problems.

## 2020-04-10 ENCOUNTER — Encounter (HOSPITAL_COMMUNITY)
Admission: RE | Disposition: A | Discharge status: Home Health | Payer: Self-pay | Source: Home / Self Care | Attending: Otolaryngology

## 2020-04-10 ENCOUNTER — Encounter (HOSPITAL_COMMUNITY): Payer: Self-pay | Admitting: Otolaryngology

## 2020-04-10 ENCOUNTER — Inpatient Hospital Stay (HOSPITAL_COMMUNITY)
Admission: RE | Admit: 2020-04-10 | Discharge: 2020-04-15 | DRG: 012 | Disposition: A | Discharge status: Home Health | Payer: Medicare HMO | Attending: Otolaryngology | Admitting: Otolaryngology

## 2020-04-10 ENCOUNTER — Other Ambulatory Visit: Payer: Self-pay

## 2020-04-10 ENCOUNTER — Inpatient Hospital Stay (HOSPITAL_COMMUNITY): Payer: Medicare HMO | Admitting: Anesthesiology

## 2020-04-10 ENCOUNTER — Inpatient Hospital Stay: Admit: 2020-04-10 | Payer: Medicare HMO | Admitting: Otolaryngology

## 2020-04-10 DIAGNOSIS — I6529 Occlusion and stenosis of unspecified carotid artery: Secondary | ICD-10-CM | POA: Diagnosis not present

## 2020-04-10 DIAGNOSIS — E785 Hyperlipidemia, unspecified: Secondary | ICD-10-CM | POA: Diagnosis present

## 2020-04-10 DIAGNOSIS — I10 Essential (primary) hypertension: Secondary | ICD-10-CM | POA: Diagnosis present

## 2020-04-10 DIAGNOSIS — Z823 Family history of stroke: Secondary | ICD-10-CM | POA: Diagnosis not present

## 2020-04-10 DIAGNOSIS — C3432 Malignant neoplasm of lower lobe, left bronchus or lung: Secondary | ICD-10-CM | POA: Diagnosis present

## 2020-04-10 DIAGNOSIS — Z8249 Family history of ischemic heart disease and other diseases of the circulatory system: Secondary | ICD-10-CM

## 2020-04-10 DIAGNOSIS — Z87891 Personal history of nicotine dependence: Secondary | ICD-10-CM

## 2020-04-10 DIAGNOSIS — K219 Gastro-esophageal reflux disease without esophagitis: Secondary | ICD-10-CM | POA: Diagnosis not present

## 2020-04-10 DIAGNOSIS — E1121 Type 2 diabetes mellitus with diabetic nephropathy: Secondary | ICD-10-CM | POA: Diagnosis not present

## 2020-04-10 DIAGNOSIS — J386 Stenosis of larynx: Secondary | ICD-10-CM | POA: Diagnosis not present

## 2020-04-10 DIAGNOSIS — Z7982 Long term (current) use of aspirin: Secondary | ICD-10-CM | POA: Diagnosis not present

## 2020-04-10 DIAGNOSIS — Z79899 Other long term (current) drug therapy: Secondary | ICD-10-CM

## 2020-04-10 DIAGNOSIS — Z801 Family history of malignant neoplasm of trachea, bronchus and lung: Secondary | ICD-10-CM

## 2020-04-10 DIAGNOSIS — Z85038 Personal history of other malignant neoplasm of large intestine: Secondary | ICD-10-CM

## 2020-04-10 DIAGNOSIS — Z20822 Contact with and (suspected) exposure to covid-19: Secondary | ICD-10-CM | POA: Diagnosis present

## 2020-04-10 DIAGNOSIS — Z7984 Long term (current) use of oral hypoglycemic drugs: Secondary | ICD-10-CM

## 2020-04-10 DIAGNOSIS — Z833 Family history of diabetes mellitus: Secondary | ICD-10-CM

## 2020-04-10 DIAGNOSIS — E119 Type 2 diabetes mellitus without complications: Secondary | ICD-10-CM | POA: Diagnosis present

## 2020-04-10 DIAGNOSIS — Z930 Tracheostomy status: Secondary | ICD-10-CM | POA: Diagnosis not present

## 2020-04-10 DIAGNOSIS — J383 Other diseases of vocal cords: Secondary | ICD-10-CM | POA: Diagnosis not present

## 2020-04-10 HISTORY — PX: TRACHEOSTOMY TUBE PLACEMENT: SHX814

## 2020-04-10 HISTORY — PX: DIRECT LARYNGOSCOPY: SHX5326

## 2020-04-10 LAB — GLUCOSE, CAPILLARY
Glucose-Capillary: 85 mg/dL (ref 70–99)
Glucose-Capillary: 80 mg/dL (ref 70–99)
Glucose-Capillary: 84 mg/dL (ref 70–99)
Glucose-Capillary: 117 mg/dL — ABNORMAL HIGH (ref 70–99)
Glucose-Capillary: 123 mg/dL — ABNORMAL HIGH (ref 70–99)
Glucose-Capillary: 176 mg/dL — ABNORMAL HIGH (ref 70–99)
Glucose-Capillary: 206 mg/dL — ABNORMAL HIGH (ref 70–99)

## 2020-04-10 SURGERY — CREATION, TRACHEOSTOMY, PERCUTANEOUS
Anesthesia: General

## 2020-04-10 SURGERY — CREATION, TRACHEOSTOMY
Anesthesia: General | Site: Throat

## 2020-04-10 MED ORDER — ACETAMINOPHEN 500 MG PO TABS: 1000.0000 mg | ORAL_TABLET | Freq: Once | ORAL | Status: DC

## 2020-04-10 MED ORDER — DEXAMETHASONE SODIUM PHOSPHATE 10 MG/ML IJ SOLN
INTRAMUSCULAR | Status: DC | PRN
  Administered 2020-04-10: 5 mg via INTRAVENOUS

## 2020-04-10 MED ORDER — HYDROCHLOROTHIAZIDE 12.5 MG PO CAPS
12.5000 mg | ORAL_CAPSULE | Freq: Every day | ORAL | Status: DC
  Administered 2020-04-10 – 2020-04-15 (×6): 12.5 mg via ORAL
  Filled 2020-04-10 (×6): qty 1

## 2020-04-10 MED ORDER — SIMVASTATIN 20 MG PO TABS
40.0000 mg | ORAL_TABLET | Freq: Every day | ORAL | Status: DC
Start: 2020-04-10 — End: 2020-04-15
  Administered 2020-04-10 – 2020-04-15 (×6): 40 mg via ORAL
  Filled 2020-04-10 (×6): qty 2

## 2020-04-10 MED ORDER — SUCCINYLCHOLINE CHLORIDE 200 MG/10ML IV SOSY
PREFILLED_SYRINGE | INTRAVENOUS | Status: DC | PRN
  Administered 2020-04-10: 100 mg via INTRAVENOUS

## 2020-04-10 MED ORDER — ONDANSETRON HCL 4 MG/2ML IJ SOLN: 4.0000 mg | INTRAMUSCULAR | Status: DC | PRN

## 2020-04-10 MED ORDER — LIDOCAINE 2% (20 MG/ML) 5 ML SYRINGE
INTRAMUSCULAR | Status: DC | PRN
  Administered 2020-04-10: 60 mg via INTRAVENOUS

## 2020-04-10 MED ORDER — AMLODIPINE BESYLATE 5 MG PO TABS
5.0000 mg | ORAL_TABLET | Freq: Every day | ORAL | Status: DC
  Administered 2020-04-11 – 2020-04-15 (×5): 5 mg via ORAL
  Filled 2020-04-10 (×5): qty 1

## 2020-04-10 MED ORDER — LIDOCAINE-EPINEPHRINE 1 %-1:100000 IJ SOLN
INTRAMUSCULAR | Status: AC
  Filled 2020-04-10: qty 1

## 2020-04-10 MED ORDER — MORPHINE SULFATE (PF) 2 MG/ML IV SOLN: 2.0000 mg | INTRAVENOUS | Status: DC | PRN

## 2020-04-10 MED ORDER — CHLORHEXIDINE GLUCONATE CLOTH 2 % EX PADS: 6.0000 | MEDICATED_PAD | Freq: Once | CUTANEOUS | Status: DC

## 2020-04-10 MED ORDER — PHENYLEPHRINE 40 MCG/ML (10ML) SYRINGE FOR IV PUSH (FOR BLOOD PRESSURE SUPPORT)
PREFILLED_SYRINGE | INTRAVENOUS | Status: AC
  Filled 2020-04-10: qty 10

## 2020-04-10 MED ORDER — CHLORHEXIDINE GLUCONATE 0.12 % MT SOLN
OROMUCOSAL | Status: AC
  Administered 2020-04-10: 15 mL
  Filled 2020-04-10: qty 15

## 2020-04-10 MED ORDER — LISINOPRIL-HYDROCHLOROTHIAZIDE 20-12.5 MG PO TABS: 1.0000 | ORAL_TABLET | Freq: Every day | ORAL | Status: DC

## 2020-04-10 MED ORDER — PROPOFOL 10 MG/ML IV BOLUS
INTRAVENOUS | Status: AC
  Filled 2020-04-10: qty 20

## 2020-04-10 MED ORDER — FENTANYL CITRATE (PF) 100 MCG/2ML IJ SOLN: 25.0000 ug | INTRAMUSCULAR | Status: DC | PRN

## 2020-04-10 MED ORDER — AMISULPRIDE (ANTIEMETIC) 5 MG/2ML IV SOLN: 10.0000 mg | Freq: Once | INTRAVENOUS | Status: DC | PRN

## 2020-04-10 MED ORDER — LIDOCAINE 2% (20 MG/ML) 5 ML SYRINGE
INTRAMUSCULAR | Status: AC
  Filled 2020-04-10: qty 10

## 2020-04-10 MED ORDER — LACTATED RINGERS IV SOLN
INTRAVENOUS | Status: DC | PRN
Start: 2020-04-10 — End: 2020-04-10

## 2020-04-10 MED ORDER — ROCURONIUM BROMIDE 10 MG/ML (PF) SYRINGE
PREFILLED_SYRINGE | INTRAVENOUS | Status: AC
  Filled 2020-04-10: qty 30

## 2020-04-10 MED ORDER — ORAL CARE MOUTH RINSE
15.0000 mL | Freq: Two times a day (BID) | OROMUCOSAL | Status: DC
  Administered 2020-04-11 – 2020-04-15 (×9): 15 mL via OROMUCOSAL

## 2020-04-10 MED ORDER — CHLORHEXIDINE GLUCONATE CLOTH 2 % EX PADS
6.0000 | MEDICATED_PAD | Freq: Once | CUTANEOUS | Status: DC
  Administered 2020-04-10: 6 via TOPICAL

## 2020-04-10 MED ORDER — ROCURONIUM BROMIDE 10 MG/ML (PF) SYRINGE
PREFILLED_SYRINGE | INTRAVENOUS | Status: DC | PRN
  Administered 2020-04-10: 10 mg via INTRAVENOUS
  Administered 2020-04-10: 30 mg via INTRAVENOUS

## 2020-04-10 MED ORDER — FENTANYL CITRATE (PF) 250 MCG/5ML IJ SOLN
INTRAMUSCULAR | Status: AC
  Filled 2020-04-10: qty 5

## 2020-04-10 MED ORDER — LISINOPRIL 20 MG PO TABS
20.0000 mg | ORAL_TABLET | Freq: Every day | ORAL | Status: DC
  Administered 2020-04-10 – 2020-04-15 (×6): 20 mg via ORAL
  Filled 2020-04-10 (×3): qty 2
  Filled 2020-04-10 (×3): qty 1

## 2020-04-10 MED ORDER — BACITRACIN ZINC 500 UNIT/GM EX OINT
1.0000 "application " | TOPICAL_OINTMENT | Freq: Three times a day (TID) | CUTANEOUS | Status: DC
  Administered 2020-04-10 – 2020-04-15 (×14): 1 via TOPICAL
  Filled 2020-04-10: qty 28.4
  Filled 2020-04-10: qty 28.35

## 2020-04-10 MED ORDER — ONDANSETRON HCL 4 MG/2ML IJ SOLN
INTRAMUSCULAR | Status: AC
  Filled 2020-04-10: qty 8

## 2020-04-10 MED ORDER — SUGAMMADEX SODIUM 200 MG/2ML IV SOLN
INTRAVENOUS | Status: DC | PRN
  Administered 2020-04-10: 200 mg via INTRAVENOUS

## 2020-04-10 MED ORDER — ONDANSETRON HCL 4 MG PO TABS: 4.0000 mg | ORAL_TABLET | ORAL | Status: DC | PRN

## 2020-04-10 MED ORDER — HYDROCODONE-ACETAMINOPHEN 5-325 MG PO TABS
1.0000 | ORAL_TABLET | ORAL | Status: DC | PRN
  Administered 2020-04-13 – 2020-04-14 (×4): 1 via ORAL
  Filled 2020-04-10 (×4): qty 1

## 2020-04-10 MED ORDER — INSULIN ASPART 100 UNIT/ML ~~LOC~~ SOLN
0.0000 [IU] | Freq: Every day | SUBCUTANEOUS | Status: DC
  Administered 2020-04-10: 2 [IU] via SUBCUTANEOUS

## 2020-04-10 MED ORDER — INSULIN ASPART 100 UNIT/ML ~~LOC~~ SOLN
0.0000 [IU] | Freq: Three times a day (TID) | SUBCUTANEOUS | Status: DC
  Administered 2020-04-10 – 2020-04-11 (×3): 2 [IU] via SUBCUTANEOUS

## 2020-04-10 MED ORDER — CHLORHEXIDINE GLUCONATE 0.12 % MT SOLN
15.0000 mL | Freq: Two times a day (BID) | OROMUCOSAL | Status: DC
  Administered 2020-04-10 – 2020-04-15 (×9): 15 mL via OROMUCOSAL
  Filled 2020-04-10 (×4): qty 15

## 2020-04-10 MED ORDER — DEXAMETHASONE SODIUM PHOSPHATE 10 MG/ML IJ SOLN
INTRAMUSCULAR | Status: AC
  Filled 2020-04-10: qty 3

## 2020-04-10 MED ORDER — ADULT MULTIVITAMIN W/MINERALS CH
1.0000 | ORAL_TABLET | Freq: Every day | ORAL | Status: DC
  Administered 2020-04-11 – 2020-04-15 (×5): 1 via ORAL
  Filled 2020-04-10 (×5): qty 1

## 2020-04-10 MED ORDER — PROPOFOL 10 MG/ML IV BOLUS
INTRAVENOUS | Status: DC | PRN
  Administered 2020-04-10: 140 mg via INTRAVENOUS

## 2020-04-10 MED ORDER — CEFAZOLIN SODIUM-DEXTROSE 2-4 GM/100ML-% IV SOLN
2.0000 g | INTRAVENOUS | Status: AC
  Administered 2020-04-10: 2 g via INTRAVENOUS
  Filled 2020-04-10: qty 100

## 2020-04-10 MED ORDER — CHLORHEXIDINE GLUCONATE CLOTH 2 % EX PADS
6.0000 | MEDICATED_PAD | Freq: Every day | CUTANEOUS | Status: DC
  Administered 2020-04-12 – 2020-04-15 (×4): 6 via TOPICAL

## 2020-04-10 MED ORDER — FENTANYL CITRATE (PF) 250 MCG/5ML IJ SOLN
INTRAMUSCULAR | Status: DC | PRN
  Administered 2020-04-10 (×2): 50 ug via INTRAVENOUS

## 2020-04-10 MED ORDER — FERROUS SULFATE 325 (65 FE) MG PO TABS
325.0000 mg | ORAL_TABLET | Freq: Every day | ORAL | Status: DC
  Administered 2020-04-11 – 2020-04-15 (×5): 325 mg via ORAL
  Filled 2020-04-10 (×5): qty 1

## 2020-04-10 MED ORDER — SUCCINYLCHOLINE CHLORIDE 200 MG/10ML IV SOSY
PREFILLED_SYRINGE | INTRAVENOUS | Status: AC
  Filled 2020-04-10: qty 20

## 2020-04-10 MED ORDER — ASPIRIN EC 81 MG PO TBEC
81.0000 mg | DELAYED_RELEASE_TABLET | Freq: Every day | ORAL | Status: DC
  Administered 2020-04-11 – 2020-04-15 (×5): 81 mg via ORAL
  Filled 2020-04-10 (×5): qty 1

## 2020-04-10 MED ORDER — VITAMIN D 25 MCG (1000 UNIT) PO TABS
1000.0000 [IU] | ORAL_TABLET | Freq: Every day | ORAL | Status: DC
  Administered 2020-04-11 – 2020-04-15 (×5): 1000 [IU] via ORAL
  Filled 2020-04-10 (×5): qty 1

## 2020-04-10 MED ORDER — KCL IN DEXTROSE-NACL 20-5-0.45 MEQ/L-%-% IV SOLN
INTRAVENOUS | Status: DC
  Filled 2020-04-10 (×5): qty 1000

## 2020-04-10 MED ORDER — 0.9 % SODIUM CHLORIDE (POUR BTL) OPTIME
TOPICAL | Status: DC | PRN
  Administered 2020-04-10: 1000 mL

## 2020-04-10 MED ORDER — LIDOCAINE-EPINEPHRINE 1 %-1:100000 IJ SOLN
INTRAMUSCULAR | Status: DC | PRN
  Administered 2020-04-10: 2 mL

## 2020-04-10 MED ORDER — ONDANSETRON HCL 4 MG/2ML IJ SOLN
INTRAMUSCULAR | Status: DC | PRN
  Administered 2020-04-10: 4 mg via INTRAVENOUS

## 2020-04-10 MED ORDER — PHENYLEPHRINE 40 MCG/ML (10ML) SYRINGE FOR IV PUSH (FOR BLOOD PRESSURE SUPPORT)
PREFILLED_SYRINGE | INTRAVENOUS | Status: DC | PRN
  Administered 2020-04-10: 120 ug via INTRAVENOUS
  Administered 2020-04-10: 160 ug via INTRAVENOUS
  Administered 2020-04-10: 120 ug via INTRAVENOUS

## 2020-04-10 SURGICAL SUPPLY — 44 items
BLADE CLIPPER SURG (BLADE) IMPLANT
BLADE SURG 15 STRL LF DISP TIS (BLADE) IMPLANT
BLADE SURG 15 STRL SS (BLADE)
CANISTER SUCT 3000ML PPV (MISCELLANEOUS) ×4 IMPLANT
CLEANER TIP ELECTROSURG 2X2 (MISCELLANEOUS) ×4 IMPLANT
CORD BIPOLAR FORCEPS 12FT (ELECTRODE) ×2 IMPLANT
COVER SURGICAL LIGHT HANDLE (MISCELLANEOUS) ×4 IMPLANT
COVER WAND RF STERILE (DRAPES) ×2 IMPLANT
DRAPE HALF SHEET 40X57 (DRAPES) IMPLANT
ELECT COATED BLADE 2.86 ST (ELECTRODE) ×4 IMPLANT
ELECT PENCIL ROCKER SW 15FT (MISCELLANEOUS) ×2 IMPLANT
ELECT REM PT RETURN 9FT ADLT (ELECTROSURGICAL) ×4
ELECTRODE REM PT RTRN 9FT ADLT (ELECTROSURGICAL) ×3 IMPLANT
FORCEPS BIPOLAR SPETZLER 8 1.0 (NEUROSURGERY SUPPLIES) ×2 IMPLANT
GAUZE 4X4 16PLY RFD (DISPOSABLE) ×2 IMPLANT
GAUZE PETROLATUM 1 X8 (GAUZE/BANDAGES/DRESSINGS) IMPLANT
GLOVE BIO SURGEON STRL SZ7.5 (GLOVE) ×4 IMPLANT
GOWN STRL REUS W/ TWL LRG LVL3 (GOWN DISPOSABLE) ×6 IMPLANT
GOWN STRL REUS W/TWL LRG LVL3 (GOWN DISPOSABLE) ×8
HOLDER TRACH TUBE VELCRO 19.5 (MISCELLANEOUS) ×4 IMPLANT
KIT BASIN OR (CUSTOM PROCEDURE TRAY) ×4 IMPLANT
KIT SUCTION CATH 14FR (SUCTIONS) IMPLANT
KIT TURNOVER KIT B (KITS) ×4 IMPLANT
NDL HYPO 25GX1X1/2 BEV (NEEDLE) IMPLANT
NEEDLE HYPO 25GX1X1/2 BEV (NEEDLE) ×4 IMPLANT
NS IRRIG 1000ML POUR BTL (IV SOLUTION) ×4 IMPLANT
PAD ARMBOARD 7.5X6 YLW CONV (MISCELLANEOUS) ×8 IMPLANT
POSITIONER HEAD DONUT 9IN (MISCELLANEOUS) ×4 IMPLANT
SPLINT NASAL DOYLE BI-VL (GAUZE/BANDAGES/DRESSINGS) ×2 IMPLANT
SPONGE DRAIN TRACH 4X4 STRL 2S (GAUZE/BANDAGES/DRESSINGS) ×4 IMPLANT
SPONGE INTESTINAL PEANUT (DISPOSABLE) ×2 IMPLANT
SUT SILK 0 FSL (SUTURE) ×2 IMPLANT
SUT SILK 2 0 REEL (SUTURE) ×4 IMPLANT
SUT SILK 2 0 SH CR/8 (SUTURE) ×4 IMPLANT
SUT VIC AB 2-0 FS1 27 (SUTURE) ×2 IMPLANT
SUT VIC AB 3-0 SH 27 (SUTURE) ×4
SUT VIC AB 3-0 SH 27X BRD (SUTURE) ×1 IMPLANT
SYR 20ML ECCENTRIC (SYRINGE) ×2 IMPLANT
SYR BULB EAR ULCER 3OZ GRN STR (SYRINGE) ×4 IMPLANT
SYR CONTROL 10ML LL (SYRINGE) ×4 IMPLANT
TRAY ENT MC OR (CUSTOM PROCEDURE TRAY) ×4 IMPLANT
TUBE TRACH  6.0 CUFF FLEX (MISCELLANEOUS) ×4
TUBE TRACH 6.0 CUFF FLEX (MISCELLANEOUS) ×1 IMPLANT
WATER STERILE IRR 1000ML POUR (IV SOLUTION) ×2 IMPLANT

## 2020-04-10 NOTE — Op Note (Signed)
Preop diagnosis: Glottic stenosis, inspiratory stridor Postop diagnosis: same Procedure: Direct laryngoscopy and tracheostomy Surgeon: Redmond Baseman Assist: None Anesth: General and local with 1% lidocaine with 1:100,000 epinephrine Compl: None Findings: Vocal folds appear normal but do not push apart well at the posterior commissure. Description:  After discussing risks, benefits, and alternatives, the patient was brought to the operative suite and placed on the operative table in the supine position.  Anesthesia was induced and the larynx was then examined using a MacIntosh blade and a suction tip was used to palpate the larynx.  Findings are noted above.  At this point, the patient was intubated by the Anesthesia team without difficulty using a small endotracheal tube.  The tube was taped to the face and his eyes were taped closed.  The anterior neck was prepped and draped in sterile fashion.  The trach site was injected with local anesthetic.  The incision was made using a 15 blade scalpel.  Subcutaneous fat was removed.  The strap muscles were retracted laterally exposing the thyroid isthmus.  The isthmus was divided using electocautery and ligated.  Retraction of the isthmus to either side allowed exposure of the anterior tracheal wall.  Soft tissues were swept from the trachea.  The space between the second and third tracheal rings was incised in a horizontal orientation with the scalpel and extended to either side with scissors.  The ring below the incision was then divided laterally on both sides to create a Bjork flap.  The segment of ring was sutured to subcutaneous tissue using 3-0 Vicryl in two locations.  A 2-0 silk stay suture was placed around the ring above the trach site.  A size 6 cuffed flexible Shiley trach was placed and the inner cannula was inserted.  The anesthesia circuit was attached and the patient was successfully ventilated with the cuff inflated.  The stay suture was tied with two  knots above the trach site.  The trach flange was secured to the neck skin using 2-0 silk in four quadrants.  The patient was cleaned off and a trach dressing and trach tie were added.  The patient was then returned to anesthesia and moved to the recovery room in stable condition.

## 2020-04-10 NOTE — Progress Notes (Signed)
Reached out to Dr. Redmond Baseman about patient receiving PO meds. Dr. Redmond Baseman says ok for patient to receive medications by mouth. Patient swallowed without difficulty/signs of aspiration. PO meds given.

## 2020-04-10 NOTE — Plan of Care (Signed)
  Problem: Pain Managment: Goal: General experience of comfort will improve Outcome: Progressing   Problem: Safety: Goal: Ability to remain free from injury will improve Outcome: Progressing   Problem: Skin Integrity: Goal: Risk for impaired skin integrity will decrease Outcome: Progressing   Problem: Activity: Goal: Ability to tolerate increased activity will improve Outcome: Progressing   Problem: Respiratory: Goal: Patent airway maintenance will improve Outcome: Progressing   Problem: Education: Goal: Ability to identify and develop effective coping behavior will improve Outcome: Progressing   Problem: Coping: Goal: Communication of feelings regarding changes in body function or appearance will improve Outcome: Not Progressing

## 2020-04-10 NOTE — Anesthesia Procedure Notes (Signed)
Procedure Name: Intubation Date/Time: 04/10/2020 11:21 AM Performed by: Janace Litten, CRNA Pre-anesthesia Checklist: Patient identified, Emergency Drugs available, Suction available and Patient being monitored Patient Re-evaluated:Patient Re-evaluated prior to induction Oxygen Delivery Method: Circle System Utilized Preoxygenation: Pre-oxygenation with 100% oxygen Induction Type: IV induction Ventilation: Mask ventilation without difficulty Laryngoscope Size: Mac and 4 Grade View: Grade I Tube type: MLT Tube size: 5.0 (MLT due to vocal cord stenosis) mm Number of attempts: 1 Airway Equipment and Method: Stylet and Oral airway Placement Confirmation: ETT inserted through vocal cords under direct vision,  positive ETCO2 and breath sounds checked- equal and bilateral Secured at: 23 cm Tube secured with: Tape Dental Injury: Teeth and Oropharynx as per pre-operative assessment

## 2020-04-10 NOTE — Anesthesia Postprocedure Evaluation (Signed)
Anesthesia Post Note  Patient: Shaun Kennedy  Procedure(s) Performed: TRACHEOSTOMY (N/A Neck) DIRECT LARYNGOSCOPY (N/A Throat)     Patient location during evaluation: PACU Anesthesia Type: General Level of consciousness: awake and alert Pain management: pain level controlled Vital Signs Assessment: post-procedure vital signs reviewed and stable Respiratory status: spontaneous breathing, nonlabored ventilation, respiratory function stable and patient connected to tracheostomy mask oxygen Cardiovascular status: blood pressure returned to baseline and stable Postop Assessment: no apparent nausea or vomiting Anesthetic complications: no   No complications documented.  Last Vitals:  Vitals:   04/10/20 1205 04/10/20 1220  BP: (!) 166/98 (!) 142/70  Pulse: 100 87  Resp: 13 14  Temp: (!) 36.2 C   SpO2: 100% 95%    Last Pain:  Vitals:   04/10/20 1205  TempSrc:   PainSc: 0-No pain                 Tiajuana Amass

## 2020-04-10 NOTE — H&P (Signed)
COLDEN SAMARAS is an 80 y.o. male.   Chief Complaint: Glottic stenosis HPI: 80 year old male with glottic stenosis and inspiratory stridor who presents for elective tracheostomy.  Past Medical History:  Diagnosis Date  . Allergic rhinitis   . Asthma   . Carotid stenosis   . Colon cancer ([place]) 2003  . Diabetes mellitus ([place])   . Diverticulosis   . Dyslipidemia   . ED (erectile dysfunction)   . GERD (gastroesophageal reflux disease)   . H/O degenerative disc disease   . Hemorrhoids   . HTN (hypertension)   . Hyperlipidemia   . Lung cancer ([place])   . LVH (left ventricular hypertrophy)    on EKG  . Mass of lower lobe of left lung   . Mediastinal adenopathy   . Smoker    former  . Wears dentures   . Wears dentures   . Wears glasses   . Wears glasses     Past Surgical History:  Procedure Laterality Date  . BRONCHIAL BIOPSY  08/20/2019   Procedure: BRONCHIAL BIOPSIES;  Surgeon: Collene Gobble, MD;  Location: [facility] ENDOSCOPY;  Service: Pulmonary;;  . BRONCHIAL BRUSHINGS  08/20/2019   Procedure: BRONCHIAL BRUSHINGS;  Surgeon: Collene Gobble, MD;  Location: [facility] ENDOSCOPY;  Service: Pulmonary;;  . BRONCHIAL NEEDLE ASPIRATION BIOPSY  08/20/2019   Procedure: BRONCHIAL NEEDLE ASPIRATION BIOPSIES;  Surgeon: Collene Gobble, MD;  Location: [facility] ENDOSCOPY;  Service: Pulmonary;;  . CARPAL TUNNEL RELEASE Right 01/09/2019   Procedure: CARPAL TUNNEL RELEASE;  Surgeon: Leandrew Koyanagi, MD;  Location: [facility];  Service: Orthopedics;  Laterality: Right;  . CATARACT EXTRACTION Right 2018  . COLONOSCOPY  2007   Dr. Benson Norway  . I & D EXTREMITY Right 04/02/2016   Procedure: IRRIGATION AND DEBRIDEMENT GREAT TOE;  Surgeon: Leandrew Koyanagi, MD;  Location: [place];  Service: Orthopedics;  Laterality: Right;  . IR IMAGING GUIDED PORT INSERTION  08/30/2019  . MULTIPLE TOOTH EXTRACTIONS    . RADIOACTIVE SEED IMPLANT  2003  . ULNAR TUNNEL RELEASE Right 01/09/2019   Procedure: RIGHT CUBITAL TUNNEL RELEASE  AND CARPAL TUNNEL RELEASE;  Surgeon: Leandrew Koyanagi, MD;  Location: [place];  Service: Orthopedics;  Laterality: Right;  . UPPER GASTROINTESTINAL ENDOSCOPY    . VIDEO BRONCHOSCOPY N/A 12/18/2019   Procedure: VIDEO BRONCHOSCOPY WITHOUT FLUORO;  Surgeon: Collene Gobble, MD;  Location: [facility] ENDOSCOPY;  Service: Cardiopulmonary;  Laterality: N/A;  . VIDEO BRONCHOSCOPY WITH ENDOBRONCHIAL ULTRASOUND N/A 08/20/2019   Procedure: VIDEO BRONCHOSCOPY WITH ENDOBRONCHIAL ULTRASOUND;  Surgeon: Collene Gobble, MD;  Location: [facility] ENDOSCOPY;  Service: Pulmonary;  Laterality: N/A;    Family History  Problem Relation Age of Onset  . Heart failure Mother   . Diabetes Mother   . Stroke Father   . Diabetes Father   . Diabetes Sister   . Diabetes Sister   . Lung cancer Sister 68  . Colon cancer Neg Hx   . Esophageal cancer Neg Hx   . Rectal cancer Neg Hx   . Colon polyps Neg Hx   . Stomach cancer Neg Hx    Social History:  reports that he quit smoking about 32 years ago. He started smoking about 65 years ago. He has a 32.00 pack-year smoking history. He has never used smokeless tobacco. He reports that he does not drink alcohol and does not use drugs.  Allergies: No Known Allergies  Medications Prior to Admission  Medication Sig Dispense  Refill  . amLODipine (NORVASC) 5 MG tablet Take 1 tablet (5 mg total) by mouth daily. 90 tablet 3  . aspirin EC 81 MG tablet Take 81 mg by mouth daily after breakfast.     . cholecalciferol (VITAMIN D) 1000 units tablet Take 1,000 Units by mouth daily after breakfast.     . ferrous sulfate 325 (65 FE) MG EC tablet Take 1 tablet (325 mg total) by mouth daily with breakfast. 30 tablet 12  . lisinopril-hydrochlorothiazide (ZESTORETIC) 20-12.5 MG tablet Take 1 tablet by mouth daily. 90 tablet 2  . metFORMIN (GLUCOPHAGE-XR) 500 MG 24 hr tablet Take 1 tablet (500 mg total) by mouth daily. 90 tablet 1  . Multiple Vitamin (MULTIVITAMIN WITH MINERALS) TABS tablet  Take 1 tablet by mouth daily after breakfast.     . simvastatin (ZOCOR) 40 MG tablet Take 1 tablet (40 mg total) by mouth daily. 90 tablet 3  . Blood Glucose Monitoring Suppl (ONETOUCH VERIO) w/Device KIT USE TO CHECK SUGAR DAILY 1 kit 0  . dronabinol (MARINOL) 2.5 MG capsule TAKE 1 CAPSULE BY MOUTH 2 TIMES DAILY BEFORE A MEAL. (Patient not taking: No sig reported) 30 capsule 1  . gabapentin (NEURONTIN) 100 MG capsule Take 1 capsule (100 mg total) by mouth 3 (three) times daily. (Patient not taking: No sig reported) 90 capsule 1  . guaiFENesin-codeine 100-10 MG/5ML syrup TAKE 5ML BY MOUTH 3 TIMES A DAY AS NEEDED FOR COUGH 240 mL 0  . Lancets (ONETOUCH DELICA PLUS [id number]) MISC PATIENT IS TO TEST TWO TIMES A DAY DX: E11.9 (ONETOUCH VERIO FLEX) 100 each 12  . lidocaine-prilocaine (EMLA) cream Apply to the Port-A-Cath site 30-60-minute before chemotherapy. (Patient not taking: No sig reported) 30 g 0  . mirtazapine (REMERON) 15 MG tablet TAKE 1 TABLET BY MOUTH EVERYDAY AT BEDTIME (Patient not taking: Reported on 04/01/2020) 90 tablet 1  . ONETOUCH VERIO test strip USE AS INSTRUCTED TO CHECK TWICE DAILY 200 strip 5    Results for orders placed or performed during the hospital encounter of 04/10/20 (from the past 48 hour(s))  Glucose, capillary     Status: None   Collection Time: 04/10/20  8:49 AM  Result Value Ref Range   Glucose-Capillary 85 70 - 99 mg/dL    Comment: Glucose reference range applies only to samples taken after fasting for at least 8 hours.   Comment 1 Notify RN    Comment 2 Document in Chart   Glucose, capillary     Status: None   Collection Time: 04/10/20 10:49 AM  Result Value Ref Range   Glucose-Capillary 80 70 - 99 mg/dL    Comment: Glucose reference range applies only to samples taken after fasting for at least 8 hours.   No results found.  Review of Systems  Respiratory: Positive for shortness of breath and stridor.   All other systems reviewed and are  negative.   Blood pressure (!) 149/59, pulse 76, temperature 98.3 F (36.8 C), temperature source Oral, resp. rate 18, height '5\' 7"'  (1.702 m), weight 80.4 kg, SpO2 100 %. Physical Exam Constitutional:      Appearance: Normal appearance. He is normal weight.  HENT:     Head: Normocephalic and atraumatic.     Right Ear: External ear normal.     Left Ear: External ear normal.     Nose: Nose normal.     Mouth/Throat:     Mouth: Mucous membranes are moist.     Pharynx: Oropharynx is clear.  Eyes:     Extraocular Movements: Extraocular movements intact.     Conjunctiva/sclera: Conjunctivae normal.     Pupils: Pupils are equal, round, and reactive to light.  Cardiovascular:     Rate and Rhythm: Normal rate.  Pulmonary:     Effort: Pulmonary effort is normal.  Musculoskeletal:     Cervical back: Normal range of motion.  Skin:    General: Skin is warm and dry.  Neurological:     General: No focal deficit present.     Mental Status: He is alert and oriented to person, place, and time.  Psychiatric:        Mood and Affect: Mood normal.        Behavior: Behavior normal.        Thought Content: Thought content normal.        Judgment: Judgment normal.      Assessment/Plan Glottic stenosis  To OR for elective tracheostomy.  Melida Quitter, MD 04/10/2020, 10:59 AM

## 2020-04-10 NOTE — Brief Op Note (Signed)
04/10/2020  11:56 AM  PATIENT:  Shaun Kennedy  80 y.o. male  PRE-OPERATIVE DIAGNOSIS:  glottic stenosis and airway obstruction  POST-OPERATIVE DIAGNOSIS:  glottic stenosis and airway obstruction  PROCEDURE:  Procedure(s): TRACHEOSTOMY (N/A) DIRECT LARYNGOSCOPY (N/A)  SURGEON:  Surgeon(s) and Role:    Melida Quitter, MD - Primary  PHYSICIAN ASSISTANT:   ASSISTANTS: none   ANESTHESIA:   general  EBL:  5 mL   BLOOD ADMINISTERED:none  DRAINS: none   LOCAL MEDICATIONS USED:  LIDOCAINE   SPECIMEN:  No Specimen  DISPOSITION OF SPECIMEN:  N/A  COUNTS:  YES  TOURNIQUET:  * No tourniquets in log *  DICTATION: .Note written in EPIC  PLAN OF CARE: Admit to inpatient   PATIENT DISPOSITION:  PACU - hemodynamically stable.   Delay start of Pharmacological VTE agent (>24hrs) due to surgical blood loss or risk of bleeding: no

## 2020-04-10 NOTE — Transfer of Care (Signed)
Immediate Anesthesia Transfer of Care Note  Patient: Shaun Kennedy  Procedure(s) Performed: TRACHEOSTOMY (N/A Neck) DIRECT LARYNGOSCOPY (N/A Throat)  Patient Location: PACU  Anesthesia Type:General  Level of Consciousness: awake, alert  and patient cooperative  Airway & Oxygen Therapy: Patient Spontanous Breathing and Patient connected to face mask oxygen  Post-op Assessment: Report given to RN and Post -op Vital signs reviewed and stable  Post vital signs: Reviewed and stable  Last Vitals:  Vitals Value Taken Time  BP    Temp    Pulse    Resp    SpO2      Last Pain:  Vitals:   04/10/20 0857  TempSrc:   PainSc: 0-No pain         Complications: No complications documented.

## 2020-04-10 NOTE — Anesthesia Preprocedure Evaluation (Signed)
Anesthesia Evaluation  Patient identified by MRN, date of birth, ID band Patient awake    Reviewed: Allergy & Precautions, NPO status , Patient's Chart, lab work & pertinent test results  Airway Mallampati: II  TM Distance: >3 FB Neck ROM: Limited    Dental  (+) Edentulous Upper, Edentulous Lower, Dental Advisory Given   Pulmonary shortness of breath, asthma , former smoker,  Lung CA   breath sounds clear to auscultation       Cardiovascular hypertension, Pt. on medications  Rhythm:Regular Rate:Normal     Neuro/Psych  Neuromuscular disease    GI/Hepatic Neg liver ROS, GERD  ,  Endo/Other  diabetes, Type 2  Renal/GU Renal disease     Musculoskeletal   Abdominal   Peds  Hematology negative hematology ROS (+) anemia ,   Anesthesia Other Findings   Reproductive/Obstetrics                             Anesthesia Physical Anesthesia Plan  ASA: III  Anesthesia Plan: General   Post-op Pain Management:    Induction: Intravenous  PONV Risk Score and Plan: 2 and Ondansetron, Dexamethasone and Treatment may vary due to age or medical condition  Airway Management Planned: Oral ETT and Tracheostomy  Additional Equipment:   Intra-op Plan:   Post-operative Plan: Possible Post-op intubation/ventilation  Informed Consent: I have reviewed the patients History and Physical, chart, labs and discussed the procedure including the risks, benefits and alternatives for the proposed anesthesia with the patient or authorized representative who has indicated his/her understanding and acceptance.       Plan Discussed with: CRNA  Anesthesia Plan Comments:         Anesthesia Quick Evaluation

## 2020-04-11 ENCOUNTER — Encounter (HOSPITAL_COMMUNITY): Payer: Self-pay | Admitting: Otolaryngology

## 2020-04-11 LAB — GLUCOSE, CAPILLARY
Glucose-Capillary: 137 mg/dL — ABNORMAL HIGH (ref 70–99)
Glucose-Capillary: 116 mg/dL — ABNORMAL HIGH (ref 70–99)
Glucose-Capillary: 125 mg/dL — ABNORMAL HIGH (ref 70–99)
Glucose-Capillary: 123 mg/dL — ABNORMAL HIGH (ref 70–99)
Glucose-Capillary: 108 mg/dL — ABNORMAL HIGH (ref 70–99)

## 2020-04-11 LAB — MRSA PCR SCREENING: MRSA by PCR: NEGATIVE

## 2020-04-11 NOTE — Evaluation (Signed)
Clinical/Bedside Swallow Evaluation Patient Details  Name: Shaun Kennedy MRN: [id number] Date of Birth: 02/10/1940  Today's Date: 04/11/2020 Time: SLP Start Time (ACUTE ONLY): 82 SLP Stop Time (ACUTE ONLY): 1022 SLP Time Calculation (min) (ACUTE ONLY): 17 min  Past Medical History:  Past Medical History:  Diagnosis Date  . Allergic rhinitis   . Asthma   . Carotid stenosis   . Colon cancer ([place]) 2003  . Diabetes mellitus ([place])   . Diverticulosis   . Dyslipidemia   . ED (erectile dysfunction)   . GERD (gastroesophageal reflux disease)   . H/O degenerative disc disease   . Hemorrhoids   . HTN (hypertension)   . Hyperlipidemia   . Lung cancer ([place])   . LVH (left ventricular hypertrophy)    on EKG  . Mass of lower lobe of left lung   . Mediastinal adenopathy   . Smoker    former  . Wears dentures   . Wears dentures   . Wears glasses   . Wears glasses    Past Surgical History:  Past Surgical History:  Procedure Laterality Date  . BRONCHIAL BIOPSY  08/20/2019   Procedure: BRONCHIAL BIOPSIES;  Surgeon: Collene Gobble, MD;  Location: [facility] ENDOSCOPY;  Service: Pulmonary;;  . BRONCHIAL BRUSHINGS  08/20/2019   Procedure: BRONCHIAL BRUSHINGS;  Surgeon: Collene Gobble, MD;  Location: [facility] ENDOSCOPY;  Service: Pulmonary;;  . BRONCHIAL NEEDLE ASPIRATION BIOPSY  08/20/2019   Procedure: BRONCHIAL NEEDLE ASPIRATION BIOPSIES;  Surgeon: Collene Gobble, MD;  Location: [facility] ENDOSCOPY;  Service: Pulmonary;;  . CARPAL TUNNEL RELEASE Right 01/09/2019   Procedure: CARPAL TUNNEL RELEASE;  Surgeon: Leandrew Koyanagi, MD;  Location: [place];  Service: Orthopedics;  Laterality: Right;  . CATARACT EXTRACTION Right 2018  . COLONOSCOPY  2007   Dr. Benson Norway  . DIRECT LARYNGOSCOPY N/A 04/10/2020   Procedure: DIRECT LARYNGOSCOPY;  Surgeon: Melida Quitter, MD;  Location: [place];  Service: ENT;  Laterality: N/A;  . I & D EXTREMITY Right 04/02/2016   Procedure: IRRIGATION AND DEBRIDEMENT GREAT TOE;   Surgeon: Leandrew Koyanagi, MD;  Location: [place];  Service: Orthopedics;  Laterality: Right;  . IR IMAGING GUIDED PORT INSERTION  08/30/2019  . MULTIPLE TOOTH EXTRACTIONS    . RADIOACTIVE SEED IMPLANT  2003  . TRACHEOSTOMY TUBE PLACEMENT N/A 04/10/2020   Procedure: TRACHEOSTOMY;  Surgeon: Melida Quitter, MD;  Location: [place];  Service: ENT;  Laterality: N/A;  . ULNAR TUNNEL RELEASE Right 01/09/2019   Procedure: RIGHT CUBITAL TUNNEL RELEASE AND CARPAL TUNNEL RELEASE;  Surgeon: Leandrew Koyanagi, MD;  Location: [place];  Service: Orthopedics;  Laterality: Right;  . UPPER GASTROINTESTINAL ENDOSCOPY    . VIDEO BRONCHOSCOPY N/A 12/18/2019   Procedure: VIDEO BRONCHOSCOPY WITHOUT FLUORO;  Surgeon: Collene Gobble, MD;  Location: [facility] ENDOSCOPY;  Service: Cardiopulmonary;  Laterality: N/A;  . VIDEO BRONCHOSCOPY WITH ENDOBRONCHIAL ULTRASOUND N/A 08/20/2019   Procedure: VIDEO BRONCHOSCOPY WITH ENDOBRONCHIAL ULTRASOUND;  Surgeon: Collene Gobble, MD;  Location: [facility] ENDOSCOPY;  Service: Pulmonary;  Laterality: N/A;   HPI:  80 year old male with glottic stenosis and inspiratory stridor who presents for elective tracheostomy.  He had Shiley 6 cuffed trach placed with plans to change out to a cuffless trach prior to discharge if all goes well.  Patient reported that the stridor began about 1 year ago following diganosis of lung cancer.  Patient with no recent imaging or swallowing work up at [facility].   Assessment /  Plan / Recommendation Clinical Impression  Clinical swallowing evaluation was completed in setting of placement of an elective trach.  The patient did not report any issues swallowing prior to admission.  PMSV was in place for this evaluation.  Cranial nerve exam was completed and unremarkable.  Lingual, labial, facial and jaw range of motion and strength appeared adequate.  Facial sensation appeared to be intact and he did not endorse a difference in facial sensation from the right to left side of his  face.  His oral and pharyngeal swallow appeared to be essentially functional.  Mastication of dry solids was adequate, despite lack of his dentures with no oral residue seen post swallow.  Swallow trigger was noted across all trials.  Patient with no overt s/s of aspiration.  In addition, presented PO trials were not noted in tracheal suctioning completed following each texture.  Given this was an elective trach and the patient reported no issues swallowing prior to admission suggest starting him on diet per the medical team to be advanced as tolerated to regular with thin liquids.  ST will follow up for therapeutic diet tolerance and need for possible objective evaluation during acute stay.  At this time speech therapy services to address swallowing are not anticipated at discharge. SLP Visit Diagnosis: Dysphagia, unspecified (R13.10)    Aspiration Risk  Mild aspiration risk    Diet Recommendation   Regular with thin liquids  Medication Administration: Whole meds with liquid    Other  Recommendations Oral Care Recommendations: Oral care BID Other Recommendations: Place PMSV during PO intake   Follow up Recommendations Other (comment) (ST services not anticipated for swallowing at [place].)      Frequency and Duration min 2x/week  2 weeks       Prognosis Prognosis for Safe Diet Advancement: Good      Swallow Study   General Date of Onset: 04/10/20 HPI: 80 year old male with glottic stenosis and inspiratory stridor who presents for elective tracheostomy.  He had Shiley 6 cuffed trach placed with plans to change out to a cuffless trach prior to discharge if all goes well.  Patient reported that the stridor began about 1 year ago following diganosis of lung cancer.  Patient with no recent imaging or swallowing work up at [facility] Type of Study: Bedside Swallow Evaluation Previous Swallow Assessment: None noted at [facility]. Diet Prior to this Study: NPO Temperature Spikes Noted: No Respiratory Status:  Trach Collar History of Recent Intubation: Yes Length of Intubations (days):  (surgery only) Date extubated: 04/10/20 Behavior/Cognition: Alert;Cooperative;Pleasant mood Oral Cavity Assessment: Within Functional Limits Oral Care Completed by SLP: No Oral Cavity - Dentition: Dentures, top;Dentures, bottom;Dentures, not available Vision: Functional for self-feeding Self-Feeding Abilities: Able to feed self Patient Positioning: Upright in bed Baseline Vocal Quality: Hoarse;Low vocal intensity Volitional Cough:  (Not assessed.) Volitional Swallow: Able to elicit    Oral/Motor/Sensory Function Overall Oral Motor/Sensory Function: Within functional limits   Ice Chips Ice chips: Not tested   Thin Liquid Thin Liquid: Within functional limits Presentation: Self Fed;Cup;Spoon    Nectar Thick Nectar Thick Liquid: Not tested   Honey Thick Honey Thick Liquid: Not tested   Puree Puree: Within functional limits Presentation: Spoon   Solid     Solid: Within functional limits Presentation: [place], [place], [place] [facility] SLP [phone]  Lamar Sprinkles 04/11/2020,10:48 AM

## 2020-04-11 NOTE — Progress Notes (Signed)
   ENT Progress Note: POD #1 s/p Procedure(s): TRACHEOSTOMY DIRECT LARYNGOSCOPY   Subjective: Stable  Objective: Vital signs in last 24 hours: Temp:  [97.2 F (36.2 C)-98.4 F (36.9 C)] 98.4 F (36.9 C) (03/05 0350) Pulse Rate:  [67-115] 68 (03/05 0600) Resp:  [10-28] 21 (03/05 0600) BP: (96-166)/(51-98) 128/51 (03/05 0530) SpO2:  [92 %-100 %] 98 % (03/05 0600) FiO2 (%):  [21 %-28 %] 21 % (03/05 0400) Weight:  [80.4 kg] 80.4 kg (03/04 0846) Weight change:  Last BM Date:  (pta)  Intake/Output from previous day: 03/04 0701 - 03/05 0700 In: 2098.1 [I.V.:2098.1] Out: 1955 [Urine:1950; Blood:5] Intake/Output this shift: Total I/O In: -  Out: 600 [Urine:600]  Labs: No results for input(s): WBC, HGB, HCT, PLT in the last 72 hours. No results for input(s): NA, K, CL, CO2, GLUCOSE, BUN, CALCIUM in the last 72 hours.  Invalid input(s): CREATININR  Studies/Results: No results found.   PHYSICAL EXAM: Trach in place, cuff deflated Min secreations   Assessment/Plan: Stable O/N Monitor airway -  Begin po after STx eval    Jerrell Belfast 04/11/2020, 8:13 AM

## 2020-04-11 NOTE — Plan of Care (Signed)
  Problem: Education: Goal: Knowledge of General Education information will improve Description: Including pain rating scale, medication(s)/side effects and non-pharmacologic comfort measures Outcome: Progressing   Problem: Health Behavior/Discharge Planning: Goal: Ability to manage health-related needs will improve Outcome: Progressing   Problem: Clinical Measurements: Goal: Ability to maintain clinical measurements within normal limits will improve Outcome: Progressing Goal: Will remain free from infection Outcome: Progressing Goal: Diagnostic test results will improve Outcome: Progressing Goal: Respiratory complications will improve Outcome: Progressing Goal: Cardiovascular complication will be avoided Outcome: Progressing   Problem: Activity: Goal: Risk for activity intolerance will decrease Outcome: Progressing   Problem: Nutrition: Goal: Adequate nutrition will be maintained Outcome: Progressing   Problem: Coping: Goal: Level of anxiety will decrease Outcome: Progressing   Problem: Elimination: Goal: Will not experience complications related to bowel motility Outcome: Progressing Goal: Will not experience complications related to urinary retention Outcome: Progressing   Problem: Pain Managment: Goal: General experience of comfort will improve Outcome: Progressing   Problem: Safety: Goal: Ability to remain free from injury will improve Outcome: Progressing   Problem: Skin Integrity: Goal: Risk for impaired skin integrity will decrease Outcome: Progressing   Problem: Education: Goal: Knowledge about tracheostomy care/management will improve Outcome: Progressing   Problem: Activity: Goal: Ability to tolerate increased activity will improve Outcome: Progressing   Problem: Health Behavior/Discharge Planning: Goal: Ability to manage tracheostomy will improve Outcome: Progressing   Problem: Respiratory: Goal: Patent airway maintenance will  improve Outcome: Progressing   Problem: Role Relationship: Goal: Ability to communicate will improve Outcome: Progressing   Problem: Education: Goal: Ability to identify and develop effective coping behavior will improve Outcome: Progressing   Problem: Coping: Goal: Communication of feelings regarding changes in body function or appearance will improve Outcome: Progressing   Problem: Health Behavior/Discharge Planning: Goal: Identification of resources available to assist in meeting health care needs will improve Outcome: Progressing   Problem: Self-Concept: Goal: Expressions of positive opinion of body image will increase Outcome: Progressing Goal: Will verbalize positive feelings about self Outcome: Progressing

## 2020-04-11 NOTE — Evaluation (Signed)
[facility] Speaking Valve - Evaluation Patient Details  Name: Shaun Kennedy MRN: [id number] Date of Birth: 1941-01-27  Today's Date: 04/11/2020 Time: 0940-1005 SLP Time Calculation (min) (ACUTE ONLY): 25 min  Past Medical History:  Past Medical History:  Diagnosis Date  . Allergic rhinitis   . Asthma   . Carotid stenosis   . Colon cancer ([place]) 2003  . Diabetes mellitus ([place])   . Diverticulosis   . Dyslipidemia   . ED (erectile dysfunction)   . GERD (gastroesophageal reflux disease)   . H/O degenerative disc disease   . Hemorrhoids   . HTN (hypertension)   . Hyperlipidemia   . Lung cancer ([place])   . LVH (left ventricular hypertrophy)    on EKG  . Mass of lower lobe of left lung   . Mediastinal adenopathy   . Smoker    former  . Wears dentures   . Wears dentures   . Wears glasses   . Wears glasses    Past Surgical History:  Past Surgical History:  Procedure Laterality Date  . BRONCHIAL BIOPSY  08/20/2019   Procedure: BRONCHIAL BIOPSIES;  Surgeon: Collene Gobble, MD;  Location: [facility] ENDOSCOPY;  Service: Pulmonary;;  . BRONCHIAL BRUSHINGS  08/20/2019   Procedure: BRONCHIAL BRUSHINGS;  Surgeon: Collene Gobble, MD;  Location: [facility] ENDOSCOPY;  Service: Pulmonary;;  . BRONCHIAL NEEDLE ASPIRATION BIOPSY  08/20/2019   Procedure: BRONCHIAL NEEDLE ASPIRATION BIOPSIES;  Surgeon: Collene Gobble, MD;  Location: [facility] ENDOSCOPY;  Service: Pulmonary;;  . CARPAL TUNNEL RELEASE Right 01/09/2019   Procedure: CARPAL TUNNEL RELEASE;  Surgeon: Leandrew Koyanagi, MD;  Location: [place];  Service: Orthopedics;  Laterality: Right;  . CATARACT EXTRACTION Right 2018  . COLONOSCOPY  2007   Dr. Benson Norway  . DIRECT LARYNGOSCOPY N/A 04/10/2020   Procedure: DIRECT LARYNGOSCOPY;  Surgeon: Melida Quitter, MD;  Location: [place];  Service: ENT;  Laterality: N/A;  . I & D EXTREMITY Right 04/02/2016   Procedure: IRRIGATION AND DEBRIDEMENT GREAT TOE;  Surgeon: Leandrew Koyanagi, MD;  Location: [place];  Service:  Orthopedics;  Laterality: Right;  . IR IMAGING GUIDED PORT INSERTION  08/30/2019  . MULTIPLE TOOTH EXTRACTIONS    . RADIOACTIVE SEED IMPLANT  2003  . TRACHEOSTOMY TUBE PLACEMENT N/A 04/10/2020   Procedure: TRACHEOSTOMY;  Surgeon: Melida Quitter, MD;  Location: [place];  Service: ENT;  Laterality: N/A;  . ULNAR TUNNEL RELEASE Right 01/09/2019   Procedure: RIGHT CUBITAL TUNNEL RELEASE AND CARPAL TUNNEL RELEASE;  Surgeon: Leandrew Koyanagi, MD;  Location: [place];  Service: Orthopedics;  Laterality: Right;  . UPPER GASTROINTESTINAL ENDOSCOPY    . VIDEO BRONCHOSCOPY N/A 12/18/2019   Procedure: VIDEO BRONCHOSCOPY WITHOUT FLUORO;  Surgeon: Collene Gobble, MD;  Location: [facility] ENDOSCOPY;  Service: Cardiopulmonary;  Laterality: N/A;  . VIDEO BRONCHOSCOPY WITH ENDOBRONCHIAL ULTRASOUND N/A 08/20/2019   Procedure: VIDEO BRONCHOSCOPY WITH ENDOBRONCHIAL ULTRASOUND;  Surgeon: Collene Gobble, MD;  Location: [facility] ENDOSCOPY;  Service: Pulmonary;  Laterality: N/A;   HPI:  80 year old male with glottic stenosis and inspiratory stridor who presents for elective tracheostomy.  He had Shiley 6 cuffed trach placed with plans to change out to a cuffless trach prior to discharge if all goes well.  Patient reported that the stridor began about 1 year ago following diganosis of lung cancer.  Patient with no recent imaging or swallowing work up at [facility].   Assessment / Plan / Recommendation Clinical Impression  Patient was  seen for a PMSV evaluation following placement of an elective trach due to glottic stenosis.  A Shiley #6 cuffed trach was placed.  Per RN cuff has been deflated since placement.  Patient requiring suctioning hourly with thin blood tinged secretions noted.  Patient complaining of no pain or breathing issues.  He's currently on 21% at 5L with humidifcation.  At start of trials patient's heart rate was 75, O2 was 97 and respiratory rate was 13.  Patient tolerated wearing the speaking valve for 30 minutes  with steady vitals and no complaints of issues breathing.  At about 15 minutes his heart rate was 88, O2 was 98 and respiratory rate was 21.  Patient's vocal quality was a bit harsh and at times intellibitility was reduced.  Given repetition he was generally able to be understood.  Patient reported that his vocal quality with PMSV in place was consistent with vocal quality prior to placement of the trach.  He was able to functionally communicate with PMSV in place and speak 4-5 words per breath.  PMSV was removed after about 30 minutes. His heart rate was 88, O2 was 98 and respiratory rate was 21 at completion of evaluation.  Education was provided to the patient who was able to teach back immediately.  However, given a delay he was unable to teach back and education had to be provided again.  He was instructed about need for the cuff to be deflated prior to placement of the valve and that he is not to wear it while sleeping or if he is short of breath.  Recommend intermittent use with staff to facilitate communication with goal to increase use.  If possible placement with meals would be ideal.  Suspect he will struggle to wear PMSV during exertion as that is when he reported he would have shortness of breath from the glottic stenosis.  ST will follow during acute stay.  He would benefit from [facility] to facilitate continued use of PMSV as well as education on proper use and donning/doffing of the PMSV for him and his wife Shaun Kennedy. SLP Visit Diagnosis: Aphonia (R49.1)    SLP Assessment  Patient needs continued Speech Lanaguage Pathology Services    Follow Up Recommendations  Home health SLP    Frequency and Duration min 2x/week  2 weeks    PMSV Trial PMSV was placed for: 30 Able to redirect subglottic air through upper airway: Yes Able to Attain Phonation: Yes Voice Quality: Hoarse Able to Expectorate Secretions: No attempts Breath Support for Phonation: Adequate Intelligibility:  Intelligible Respirations During Trial: 21 SpO2 During Trial: 98 % Pulse During Trial: 88 Behavior: Alert;Controlled;Cooperative;Expresses self well;Good eye contact   Tracheostomy Tube  Additional Tracheostomy Tube Assessment Secretion Description: thin blood tinged Frequency of Tracheal Suctioning: approximately every hour per RN Level of Secretion Expectoration: Tracheal    Vent Dependency  Vent Dependent: No FiO2 (%): 21 %    Cuff Deflation Trial  GO Tolerated Cuff Deflation:  (NA cuff deflated upon ST entry) Length of Time for Cuff Deflation Trial:  (since placement of trach) Behavior: Alert;Cooperative;Expresses self well;Good eye contact         Shelly Flatten, [place], [place] Acute [facility] SLP [phone]  Lamar Sprinkles 04/11/2020, 10:37 AM

## 2020-04-12 LAB — GLUCOSE, CAPILLARY
Glucose-Capillary: 117 mg/dL — ABNORMAL HIGH (ref 70–99)
Glucose-Capillary: 108 mg/dL — ABNORMAL HIGH (ref 70–99)
Glucose-Capillary: 102 mg/dL — ABNORMAL HIGH (ref 70–99)
Glucose-Capillary: 103 mg/dL — ABNORMAL HIGH (ref 70–99)

## 2020-04-12 NOTE — Progress Notes (Signed)
Patient arrived to room 6N09 in NAD, VS stable and patient free from pain. Respiratory will come to bedside to help set up trach collar.

## 2020-04-12 NOTE — Progress Notes (Signed)
Pt has bed on 6N09.  Pt to be transported by bed. Report given to [facility], receiving RN 6N

## 2020-04-12 NOTE — Progress Notes (Signed)
   ENT Progress Note: POD #2 s/p Procedure(s): TRACHEOSTOMY DIRECT LARYNGOSCOPY   Subjective: Tol po without cough, breathing comfortably   Objective: Vital signs in last 24 hours: Temp:  [98.1 F (36.7 C)-99 F (37.2 C)] 98.2 F (36.8 C) (03/06 0700) Pulse Rate:  [68-114] 95 (03/06 0700) Resp:  [15-28] 24 (03/06 0700) BP: (100-189)/(61-105) 142/105 (03/06 0700) SpO2:  [90 %-100 %] 95 % (03/06 0700) FiO2 (%):  [21 %] 21 % (03/06 0712) Weight change:  Last BM Date:  (pta)  Intake/Output from previous day: 03/05 0701 - 03/06 0700 In: 3011.6 [P.O.:720; I.V.:2291.6] Out: 2300 [Urine:2300] Intake/Output this shift: Total I/O In: 360 [P.O.:360] Out: 450 [Urine:450]  Labs: No results for input(s): WBC, HGB, HCT, PLT in the last 72 hours. No results for input(s): NA, K, CL, CO2, GLUCOSE, BUN, CALCIUM in the last 72 hours.  Invalid input(s): CREATININR  Studies/Results: No results found.   PHYSICAL EXAM: Trach inplace and airway stable   Assessment/Plan: Cont trach care and teaching Adv po as tol Plan transfer to [employer] 04/12/2020, 9:37 AM

## 2020-04-12 NOTE — Plan of Care (Signed)
  Problem: Education: Goal: Knowledge of General Education information will improve Description Including pain rating scale, medication(s)/side effects and non-pharmacologic comfort measures Outcome: Progressing   

## 2020-04-12 NOTE — Progress Notes (Signed)
Spoke with wife, Boyd [place] on the phone today after the pt was transferred to 6N.  Her phone #s are: [phone] c. And [phone] home. She states if call the home #, leave a message and then call again.  She and her son, Kenith Trickel will be learning how to do trach care.  They will be here tomorrow, 3/7 at 0830 to visit.  I told her that when she comes tomorrow, they can set up a time for education on the trach care.

## 2020-04-13 LAB — GLUCOSE, CAPILLARY
Glucose-Capillary: 101 mg/dL — ABNORMAL HIGH (ref 70–99)
Glucose-Capillary: 110 mg/dL — ABNORMAL HIGH (ref 70–99)
Glucose-Capillary: 112 mg/dL — ABNORMAL HIGH (ref 70–99)
Glucose-Capillary: 117 mg/dL — ABNORMAL HIGH (ref 70–99)

## 2020-04-13 NOTE — TOC Initial Note (Addendum)
Transition of Care [facility]) - Initial/Assessment Note    Patient Details  Name: Shaun Kennedy MRN: [id number] Date of Birth: 03/12/1940  Transition of Care [facility]) CM/SW Contact:    Marilu Favre, RN Phone Number: 04/13/2020, 2:05 PM  Clinical Narrative:                 Confirmed face sheet information with patient at bedside.   Discussed plan for him to discharge possibly 04/15/20 with 6 cuffless trach. Nurse is ordering same. Once 6 cuffless trach arrives to floor NCM will need to know if it is disposable inner cannula or not.   NCM ordered supplies with Zach with [facility]. 14 fr suction catheters, trach ties, trach care kits, humidified  air , suction, yankauer suction, 6 cuffless trach.   On day of discharge bedside nurse will provide trach one size smaller ( 4 nurse ordering today) and ambu bag to take with him. [facility] requests nurse sends 5 days of supplies home with patient also. ( nurse ordering).   NCM working on arranging home health :   Tommi Rumps with [facility] does not accept new trachs.  Amy with [facility] does accept new trach's not cannot accept referral due to patient's insurance.   [place] with [facility] does not accept new trachs.   Ramond Marrow with [facility] checking to see if they can accept new trach. [facility] cannot accept a new trach.  [place] with [facility] does not accept new trach.  Hoyle Sauer with [facility] checking to see if she can accept referral. Hoyle Sauer unable to accept referral due to staffing   Danae Chen with [facility]  Unable to take referral due to staffing   [facility] with Janeece Riggers unable to accept referral , due to insurance.   Malachy Mood with [facility] unable to accept referral due to insurance.  Tillie Rung with [facility] covers address but does not take new trachs.   Levada Dy with [facility] , left message awaiting call back. Levada Dy will review clinicals and call NCM back in morning.   Patient aware.  Patient states respiratory provided education this am with him and familyExpected Discharge Plan: [place] Barriers to Discharge: Continued Medical Work up   Patient Goals and CMS Choice Patient states their goals for this hospitalization and ongoing recovery are:: to return to home CMS Medicare.gov Compare Post Acute Care list provided to:: Patient Choice offered to / list presented to : Patient  Expected Discharge Plan and Services Expected Discharge Plan: [place] Choice: [place] arrangements for the past 2 months: Single Family Home                 DME Arranged: Trach supplies (humidified air , trach care kits,) DME Agency: [facility]                  Prior Living Arrangements/Services Living arrangements for the past 2 months: Single Family Home Lives with:: Spouse                   Activities of Daily Living Home Assistive Devices/Equipment: Dentures (specify type),Eyeglasses,Blood pressure cuff,CBG Meter ADL Screening (condition at time of admission) Patient's cognitive ability adequate to safely complete daily activities?: Yes Is the patient deaf or have difficulty hearing?: Yes Does the patient have difficulty seeing, even when wearing glasses/contacts?: No Does the patient have difficulty concentrating,  remembering, or making decisions?: No Patient able to express need for assistance with ADLs?: Yes Does the patient have difficulty dressing or bathing?: No Independently performs ADLs?: Yes (appropriate for developmental age) Does the patient have difficulty walking or climbing stairs?: No Weakness of Legs: None Weakness of Arms/Hands: Both  Permission Sought/Granted                  Emotional Assessment              Admission diagnosis:  Glottic stenosis [J38.6] Patient Active Problem List   Diagnosis Date Noted  .  Pleuritic chest pain 03/17/2020  . Chemotherapy-induced neuropathy ([place]) 02/12/2020  . Vocal cord dysfunction 02/12/2020  . 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 Stridor 12/18/2019  . Abnormal findings on diagnostic imaging of lung 11/06/2019  . Antineoplastic chemotherapy induced anemia 10/16/2019  . Shortness of breath 10/15/2019  . Odynophagia 09/25/2019  . Port-A-Cath in place 09/17/2019  . Decreased appetite 09/03/2019  . Malignant neoplasm of lower lobe of left lung ([place]) 08/28/2019  . Encounter for antineoplastic chemotherapy 08/24/2019  . Encounter for antineoplastic immunotherapy 08/24/2019  . Goals of care, counseling/discussion 08/24/2019  . Atherosclerosis of aorta ([place]) 04/09/2019  . Carpal tunnel syndrome, left upper limb 02/19/2019  . History of carpal tunnel surgery of right wrist 12/21/2018  . OAB (overactive bladder) 10/11/2017  . Diabetic nephropathy associated with type 2 diabetes mellitus ([place]) 10/11/2017  . Onychomycosis of multiple toenails with type 2 diabetes mellitus ([place]) 07/22/2015  . Former smoker, stopped smoking in distant past 07/22/2015  . Diabetes mellitus ([place]) 06/13/2011  . ED (erectile dysfunction) 06/13/2011  . History of prostate cancer 06/13/2011  . Hypertension associated with diabetes ([place]) 06/13/2011  . Hyperlipidemia associated with type 2 diabetes mellitus ([place]) 06/13/2011  . Allergic rhinitis, mild 06/13/2011  . History of asthma 06/13/2011  . Carotid stenosis 06/13/2011   PCP:  [name], MD Pharmacy:   [facility], [place] [address] Phone: [phone] Fax: [phone]  [place], [place] [address] Phone: [phone] Fax: [phone]  [id number] [address], [place] [address] Phone: [phone]  Fax: [phone]     Social Determinants of Health (SDOH) Interventions    Readmission Risk Interventions No flowsheet data found.

## 2020-04-13 NOTE — Progress Notes (Signed)
Speech Language Pathology Treatment: [place] Speaking valve;Dysphagia  Patient Details Name: Shaun Kennedy MRN: [id number] DOB: September 26, 1940 Today's Date: 04/13/2020 Time: 1345-1400 SLP Time Calculation (min) (ACUTE ONLY): 15 min  Assessment / Plan / Recommendation Clinical Impression  Pt demonstrates excellent tolerance of PMSV. Vocal quality harsh, but pt intelligible. Denies any sensation of difficulty breathing, only when he is hot. Pt able to state that PMSV should be in place while eating and drinking, but removed for sleep. He is wearing PMSV with intermittent supervision but is not yet removing and placing the valve himself. When trach is changed will return to provided full independence with placement. Pt also ready for upgrade to regular diet. No signs of aspiration; dentures in place and pt masticates well. Will upgrade diet.   HPI HPI: 80 year old male with glottic stenosis and inspiratory stridor who presents for elective tracheostomy.  He had Shiley 6 cuffed trach placed with plans to change out to a cuffless trach prior to discharge if all goes well.  Patient reported that the stridor began about 1 year ago following diganosis of lung cancer.  Patient with no recent imaging or swallowing work up at [facility].      SLP Plan  Continue with current plan of care       Recommendations  Diet recommendations: Regular;Thin liquid Liquids provided via: Cup;Straw Medication Administration: Whole meds with liquid Supervision: Patient able to self feed Postural Changes and/or Swallow Maneuvers: Seated upright 90 degrees      Patient may use Passy-Muir Speech Valve: During all therapies with supervision;During all waking hours (remove during sleep);During PO intake/meals PMSV Supervision: Intermittent MD: Please consider changing trach tube to : Cuffless         Oral Care Recommendations: Oral care BID Follow up Recommendations: Other (comment) (trach clinic) SLP Visit Diagnosis:  Dysphagia, unspecified (R13.10) Plan: Continue with current plan of care       GO                Shaun Kennedy, Shaun Kennedy 04/13/2020, 2:14 PM

## 2020-04-13 NOTE — Care Management Important Message (Signed)
Important Message  Patient Details  Name: Shaun Kennedy MRN: [id number] Date of Birth: 1940/02/14   Medicare Important Message Given:  Yes     Orbie Pyo 04/13/2020, 3:43 PM

## 2020-04-13 NOTE — Care Management (Addendum)
Cory with [facility] does not accept new trachs.  Amy with [facility] does accept new trach's not cannot accept referral due to patient's insurance.   [place] with [facility] does not accept new trachs.   Ramond Marrow with [facility] checking to see if they can accept new trach. [facility] cannot accept a new trach.  [place] with [facility] does not accept new trach.  Hoyle Sauer with [facility] checking to see if she can accept referral.

## 2020-04-13 NOTE — Progress Notes (Signed)
Trach education done with wife and family. Visual aids of a trach tube, suction equipment, and a trach education booklet all used during education. Family and wife very receptive of education and asked appropriate questions.  Contact number for respiratory department left with wife and encouraged wife to attend trach clinic for follow-up trach care education. No issues noted at time of education. Very supportive family. Pt currently has a cuffed trach in place which will be changed prior to pt being discharged to home. Respiratory will continue to follow patient until discharged.

## 2020-04-13 NOTE — Progress Notes (Signed)
Subjective: Some throat soreness.  Swallowing well generally.  Objective: Vital signs in last 24 hours: Temp:  [98.9 F (37.2 C)-99.5 F (37.5 C)] 98.9 F (37.2 C) (03/07 0338) Pulse Rate:  [67-109] 102 (03/07 0814) Resp:  [18-20] 18 (03/07 0814) BP: (119-121)/(70-73) 120/71 (03/07 0338) SpO2:  [89 %-100 %] 95 % (03/07 1224) FiO2 (%):  [21 %] 21 % (03/07 1224) Wt Readings from Last 1 Encounters:  04/10/20 80.4 kg    Intake/Output from previous day: 03/06 0701 - 03/07 0700 In: 1188.4 [P.O.:965; I.V.:223.4] Out: 1200 [Urine:1200] Intake/Output this shift: Total I/O In: -  Out: 175 [Urine:175]  General appearance: alert, cooperative and no distress Neck: cuffed #6 flexible Shiley trach in place with cuff deflated, tolerating Passy-Muir valve well  No results for input(s): WBC, HGB, HCT, PLT in the last 72 hours.  No results for input(s): NA, K, CL, CO2, GLUCOSE, BUN, CREATININE, CALCIUM in the last 72 hours.  Medications: I have reviewed the patient's current medications.  Assessment/Plan: Glottic stenosis s/p tracheostomy  Progressing well.  Will ambulate today.  Tolerating oral diet.  Plan trach change and discharge in two days.   LOS: 3 days   Melida Quitter 04/13/2020, 12:36 PM

## 2020-04-13 NOTE — Progress Notes (Addendum)
Pt has been suctioned multiple times this shift. Pt has had large amounts of thick white secretions(productive cough). Will continue to monitor

## 2020-04-14 ENCOUNTER — Telehealth: Payer: Self-pay | Admitting: Emergency Medicine

## 2020-04-14 LAB — GLUCOSE, CAPILLARY
Glucose-Capillary: 95 mg/dL (ref 70–99)
Glucose-Capillary: 103 mg/dL — ABNORMAL HIGH (ref 70–99)
Glucose-Capillary: 104 mg/dL — ABNORMAL HIGH (ref 70–99)
Glucose-Capillary: 104 mg/dL — ABNORMAL HIGH (ref 70–99)

## 2020-04-14 MED ORDER — SCOPOLAMINE 1 MG/3DAYS TD PT72
1.0000 | MEDICATED_PATCH | TRANSDERMAL | Status: DC
  Administered 2020-04-14: 1.5 mg via TRANSDERMAL
  Filled 2020-04-14: qty 1

## 2020-04-14 NOTE — Progress Notes (Signed)
This RN called Respiratory Therapist on call regarding pts excessive trach secretions and pt c/o not being able to breathe. SpO2 at 74% when called to the room. This RN suctioned pt. SpO2 raised to 88% and sustained. Respiratory therapist instructed this RN to increase FiO2 to 35 and O2 Flow Rate to 10. SpO2 currently sustaining at 90%. Will continue to monitor pt and follow-up with respiratory with any changes.   MD paged and made aware. New verbal orders given. See MAR.

## 2020-04-14 NOTE — TOC Progression Note (Addendum)
Transition of Care [facility]) - Progression Note    Patient Details  Name: Shaun Kennedy MRN: [id number] Date of Birth: 1940-11-22  Transition [facility] Care [facility]) CM/SW Contact  Wile, Edson Snowball, RN Phone Number: 04/14/2020, 11:45 AM  Clinical Narrative:     Shaun Kennedy with [facility] to follow up , if they can accept referral, left message awaiting call back. Shaun Kennedy is unable to accept a new trach.   NCM has exhausted home health agency.   Patient aware. NCM called patient's wife Shaun Kennedy [phone] and explained.    Dr Redmond Baseman aware.  Shaun Kennedy was provided education yesterday. Shaun Kennedy stated she watched respir "do everything but did not have any hands on experience." NCM explained will have respiratory call her and arrange more education.  Patient cannot be discharged until patient and family can demonstrate trach care.   NCM called main respiratory office and left message to arrange education. NCM also called Shaun Kennedy . Shaun Kennedy will call Shaun Kennedy to arrange education. Bedside nurses aware . Shaun Kennedy with Respir returned call. She will call Shaun Kennedy and see if she has had a chance to arrange education. Shaun Kennedy also encourage bedside nursing to provide education.     Expected Discharge Plan: [place] Barriers to Discharge: Continued Medical Work up  Expected Discharge Plan and Services Expected Discharge Plan: [place] Choice: [place] arrangements for the past 2 months: Single Family Home                 DME Arranged: Trach supplies (humidified air , trach care kits,) DME Agency: [facility]                   Social Determinants of Health (SDOH) Interventions    Readmission Risk Interventions No flowsheet data found.

## 2020-04-14 NOTE — Telephone Encounter (Signed)
Called and spoke with pts wife and she stated that her husband is going to be discharged tomorrow from the hospital.  She stated that he had the tracheostomy done and the pts wife spoke with the social worker about getting help in the home until she feels comfortable taking care of him.  The social worker told her that they have no one that can come and care for him.  She has reached out to many companies and they have no one to send.  They have been training the family but the wife feels very bad about all of this.  She is looking for any type of help.  RB please advise.  Thanks

## 2020-04-14 NOTE — Progress Notes (Signed)
Subjective: Doing well.  No complaints.  Swallowing fine.  Objective: Vital signs in last 24 hours: Temp:  [98.4 F (36.9 C)-99.9 F (37.7 C)] 99.9 F (37.7 C) (03/08 0551) Pulse Rate:  [50-100] 98 (03/08 1132) Resp:  [18-24] 18 (03/08 1132) BP: (119-155)/(60-67) 119/60 (03/08 0551) SpO2:  [93 %-100 %] 95 % (03/08 1132) FiO2 (%):  [21 %] 21 % (03/08 1132) Wt Readings from Last 1 Encounters:  04/10/20 80.4 kg    Intake/Output from previous day: 03/07 0701 - 03/08 0700 In: -  Out: 425 [Urine:425] Intake/Output this shift: Total I/O In: -  Out: 100 [Urine:100]  General appearance: alert, cooperative and no distress Neck: #6 cuffed flexible Shiley trach in place, cuff deflated, Passy-Muir valve in place  No results for input(s): WBC, HGB, HCT, PLT in the last 72 hours.  No results for input(s): NA, K, CL, CO2, GLUCOSE, BUN, CREATININE, CALCIUM in the last 72 hours.  Medications: I have reviewed the patient's current medications.  Assessment/Plan: Glottic stenosis s/p tracheostomy  Doing well.  Anticipate discharge tomorrow.  Ambulate.  Trach teaching.   LOS: 4 days   Shaun Kennedy 04/14/2020, 12:44 PM

## 2020-04-15 LAB — GLUCOSE, CAPILLARY
Glucose-Capillary: 81 mg/dL (ref 70–99)
Glucose-Capillary: 107 mg/dL — ABNORMAL HIGH (ref 70–99)

## 2020-04-15 MED ORDER — HYDROCODONE-ACETAMINOPHEN 5-325 MG PO TABS: 1.0000 | ORAL_TABLET | Freq: Four times a day (QID) | ORAL | 0 refills | Status: DC | PRN

## 2020-04-15 NOTE — Progress Notes (Signed)
  Speech Language Pathology Treatment: Nada Boozer Speaking valve  Patient Details Name: Shaun Kennedy MRN: [id number] DOB: 1940/12/18 Today's Date: 04/15/2020 Time: 1150-1201 SLP Time Calculation (min) (ACUTE ONLY): 11 min  Assessment / Plan / Recommendation Clinical Impression  Education provided to pt and wife regarding PMSV home use. Pt able to demonstrate placement and removal of valve while standing at bathroom mirror. Wife listed PMSV use precautions independently. SLP described cleaning method. No SLP f/u needed at this time after d/c. Pt to f/u at trach clinic for any PMSV replacement needs. SLP will sign off.   HPI HPI: 80 year old male with glottic stenosis and inspiratory stridor who presents for elective tracheostomy.  He had Shiley 6 cuffed trach placed with plans to change out to a cuffless trach prior to discharge if all goes well.  Patient reported that the stridor began about 1 year ago following diganosis of lung cancer.  Patient with no recent imaging or swallowing work up at [facility].      SLP Plan  All goals met       Recommendations         Patient may use Passy-Muir Speech Valve: During all waking hours (remove during sleep) PMSV Supervision: Intermittent         Oral Care Recommendations: Oral care BID;Patient independent with oral care Follow up Recommendations: Other (comment) Plan: All goals met       GO               Herbie Baltimore, MA CCC-SLP  Acute Rehabilitation Services Pager [phone] Office [phone]  Lynann Beaver 04/15/2020, 1:33 PM

## 2020-04-15 NOTE — Procedures (Signed)
Tracheostomy Change Note  Patient Details:   Name: Shaun Kennedy DOB: 1940/05/24 MRN: [id number]    Airway Documentation: Assisted Dr. Redmond Baseman with trach change this am  Pt suctioned and trach ties changed also Evaluation  O2 sats: stable throughout Complications: No apparent complications Patient did tolerate procedure well. Bilateral Breath Sounds: Rhonchi    Johnnette Gourd 04/15/2020, 09:30 am

## 2020-04-15 NOTE — Discharge Summary (Signed)
Physician Discharge Summary  Patient ID: Shaun Kennedy MRN: [id number] DOB/AGE: [birth year] 80 y.o.  Admit date: 04/10/2020 Discharge date: 04/15/2020  Admission Diagnoses: Glottic stenosis  Discharge Diagnoses:  Active Problems:   Glottic stenosis   Discharged Condition: good  Hospital Course: 80 year old male with lung cancer who developed worsening difficulty breathing with exertion found to be due to glottic narrowing and immobile vocal folds.  After considering options for management, he elected to proceed with tracheostomy.  See operative note.  He was observed after surgery in ICU and did well.  His diet was advanced under guidance from speech pathology and the cuff of his trach tube was able to be deflated.  He tolerated a Passy-Muir valve.  His hospital stay was uneventful.  On POD 5, his trach tube was changed to a cuffless #6 flexible Shiley tube without difficulty and the family was given intensive instruction on trach care with the help of respiratory therapy.  He is felt stable for discharge home.  Consults: Speech pathology and respiratory therapy  Significant Diagnostic Studies: None  Treatments: Surgery: tracheostomy and direct laryngoscopy  Discharge Exam: Blood pressure 106/60, pulse 88, temperature 98 F (36.7 C), resp. rate 20, height '5\' 7"'  (1.702 m), weight 80.4 kg, SpO2 95 %. General appearance: alert, cooperative and no distress Neck: trach tube changed to cuffless #6 flexible Shiley trach without difficulty, stoma healing, tolerating Passy-Muir valve  Disposition: Discharge disposition: 01-Home or Self Care       Discharge Instructions    Diet - low sodium heart healthy   Complete by: As directed    Discharge instructions   Complete by: As directed    Trach care as instructed.  Resume normal diet as able.  Resume all home medications.   Increase activity slowly   Complete by: As directed    No wound care   Complete by: As directed      Allergies  as of 04/15/2020   No Known Allergies     Medication List    TAKE these medications   amLODipine 5 MG tablet Commonly known as: NORVASC Take 1 tablet (5 mg total) by mouth daily.   aspirin EC 81 MG tablet Take 81 mg by mouth daily after breakfast.   cholecalciferol 1000 units tablet Commonly known as: VITAMIN D Take 1,000 Units by mouth daily after breakfast.   dronabinol 2.5 MG capsule Commonly known as: MARINOL TAKE 1 CAPSULE BY MOUTH 2 TIMES DAILY BEFORE A MEAL.   ferrous sulfate 325 (65 FE) MG EC tablet Take 1 tablet (325 mg total) by mouth daily with breakfast.   gabapentin 100 MG capsule Commonly known as: NEURONTIN Take 1 capsule (100 mg total) by mouth 3 (three) times daily.   guaiFENesin-codeine 100-10 MG/5ML syrup TAKE 5ML BY MOUTH 3 TIMES A DAY AS NEEDED FOR COUGH   HYDROcodone-acetaminophen 5-325 MG tablet Commonly known as: NORCO/VICODIN Take 1-2 tablets by mouth every 6 (six) hours as needed for moderate pain.   lidocaine-prilocaine cream Commonly known as: EMLA Apply to the Port-A-Cath site 30-60-minute before chemotherapy.   lisinopril-hydrochlorothiazide 20-12.5 MG tablet Commonly known as: ZESTORETIC Take 1 tablet by mouth daily.   metFORMIN 500 MG 24 hr tablet Commonly known as: GLUCOPHAGE-XR Take 1 tablet (500 mg total) by mouth daily.   mirtazapine 15 MG tablet Commonly known as: REMERON TAKE 1 TABLET BY MOUTH EVERYDAY AT BEDTIME   multivitamin with minerals Tabs tablet Take 1 tablet by mouth daily after breakfast.  OneTouch Delica Plus [id number] Misc PATIENT IS TO TEST TWO TIMES A DAY DX: E11.9 (ONETOUCH VERIO FLEX)   OneTouch Verio test strip Generic drug: glucose blood USE AS INSTRUCTED TO CHECK TWICE DAILY   OneTouch Verio w/Device Kit USE TO CHECK SUGAR DAILY   simvastatin 40 MG tablet Commonly known as: ZOCOR Take 1 tablet (40 mg total) by mouth daily.        Signed: Melida Quitter 04/15/2020, 10:19 AM

## 2020-04-15 NOTE — Progress Notes (Signed)
In with patient and family this morning to educate and teach trach care and maintenance for home.  Patient's wife, son and son's girlfriend was present today.  All parties were willing to learn today and very  pleasant.  I was able to do return demonstration on suctioning trach, cleaning inner cannula and changing dressing with both the girlfriend Levada Dy and the wife today.  Dr. Redmond Baseman was in on the beginiing of the education and training.  At that time the trach was changed to uncuffed 6 flexed Shilly.  Family was taught by MD and RT on the importance of the obturator and emergency situations as well as the proper way to suction.  Also was able to teach how to change the trach ties with good return demonstration.  Family feels good about the education and training received today. Son did not do hands on today.  Good sterile technique was used  taught and demonstrated today.

## 2020-04-15 NOTE — TOC Progression Note (Signed)
Transition of Care [facility]) - Progression Note    Patient Details  Name: Shaun Kennedy MRN: [id number] Date of Birth: 12-11-1940  Transition of Care [facility]) CM/SW Contact  Jacalyn Lefevre Edson Snowball, RN Phone Number: 04/15/2020, 11:38 AM  Clinical Narrative:     NCM ordered supplies with Zach with [facility]. 14 fr suction catheters, trach ties, trach care kits, humidified  air , suction, yankauer suction, 6 cuffless trach.   On day of discharge bedside nurse will provide trach one size smaller and ambu bag to take with him. [employer] requests nurse sends 5 days of supplies home with patient also.Nursing staff has ordered .  NCM has called Thedore Mins with [facility] for suction etc  Expected Discharge Plan: [place] Barriers to Discharge: Continued Medical Work up  Expected Discharge Plan and Services Expected Discharge Plan: [place] arrangements for the past 2 months: Single Family Home Expected Discharge Date: 04/15/20               DME Arranged: [place]line Idol supplies (humidified air , trach care kits,) DME Agency: [facility]                   Social Determinants of Health (SDOH) Interventions    Readmission Risk Interventions No flowsheet data found.

## 2020-04-17 ENCOUNTER — Ambulatory Visit: Payer: Medicare HMO | Admitting: Emergency Medicine

## 2020-04-17 DIAGNOSIS — J386 Stenosis of larynx: Secondary | ICD-10-CM | POA: Diagnosis not present

## 2020-04-17 DIAGNOSIS — Z930 Tracheostomy status: Secondary | ICD-10-CM | POA: Diagnosis not present

## 2020-04-19 ENCOUNTER — Encounter (HOSPITAL_COMMUNITY): Payer: Self-pay

## 2020-04-19 ENCOUNTER — Other Ambulatory Visit: Payer: Self-pay

## 2020-04-19 ENCOUNTER — Emergency Department (HOSPITAL_COMMUNITY): Payer: Medicare HMO

## 2020-04-19 ENCOUNTER — Inpatient Hospital Stay (HOSPITAL_COMMUNITY)
Admission: EM | Admit: 2020-04-19 | Discharge: 2020-04-23 | DRG: 682 | Disposition: A | Discharge status: Home Health | Payer: Medicare HMO | Attending: Family Medicine | Admitting: Family Medicine

## 2020-04-19 DIAGNOSIS — L89312 Pressure ulcer of right buttock, stage 2: Secondary | ICD-10-CM | POA: Diagnosis not present

## 2020-04-19 DIAGNOSIS — R319 Hematuria, unspecified: Secondary | ICD-10-CM | POA: Diagnosis not present

## 2020-04-19 DIAGNOSIS — E861 Hypovolemia: Secondary | ICD-10-CM | POA: Diagnosis not present

## 2020-04-19 DIAGNOSIS — I9589 Other hypotension: Secondary | ICD-10-CM | POA: Diagnosis not present

## 2020-04-19 DIAGNOSIS — I10 Essential (primary) hypertension: Secondary | ICD-10-CM | POA: Diagnosis present

## 2020-04-19 DIAGNOSIS — E871 Hypo-osmolality and hyponatremia: Secondary | ICD-10-CM | POA: Diagnosis present

## 2020-04-19 DIAGNOSIS — E11649 Type 2 diabetes mellitus with hypoglycemia without coma: Secondary | ICD-10-CM | POA: Diagnosis present

## 2020-04-19 DIAGNOSIS — Z85038 Personal history of other malignant neoplasm of large intestine: Secondary | ICD-10-CM

## 2020-04-19 DIAGNOSIS — L89322 Pressure ulcer of left buttock, stage 2: Secondary | ICD-10-CM | POA: Diagnosis not present

## 2020-04-19 DIAGNOSIS — E785 Hyperlipidemia, unspecified: Secondary | ICD-10-CM | POA: Diagnosis present

## 2020-04-19 DIAGNOSIS — I700 Atherosclerosis of aorta: Secondary | ICD-10-CM | POA: Diagnosis present

## 2020-04-19 DIAGNOSIS — T402X5A Adverse effect of other opioids, initial encounter: Secondary | ICD-10-CM | POA: Diagnosis not present

## 2020-04-19 DIAGNOSIS — G928 Other toxic encephalopathy: Secondary | ICD-10-CM | POA: Diagnosis not present

## 2020-04-19 DIAGNOSIS — N182 Chronic kidney disease, stage 2 (mild): Secondary | ICD-10-CM | POA: Diagnosis present

## 2020-04-19 DIAGNOSIS — J181 Lobar pneumonia, unspecified organism: Secondary | ICD-10-CM | POA: Diagnosis not present

## 2020-04-19 DIAGNOSIS — R0902 Hypoxemia: Secondary | ICD-10-CM | POA: Diagnosis not present

## 2020-04-19 DIAGNOSIS — Z923 Personal history of irradiation: Secondary | ICD-10-CM | POA: Diagnosis not present

## 2020-04-19 DIAGNOSIS — C3432 Malignant neoplasm of lower lobe, left bronchus or lung: Secondary | ICD-10-CM | POA: Diagnosis present

## 2020-04-19 DIAGNOSIS — Z7984 Long term (current) use of oral hypoglycemic drugs: Secondary | ICD-10-CM

## 2020-04-19 DIAGNOSIS — L8990 Pressure ulcer of unspecified site, unspecified stage: Secondary | ICD-10-CM | POA: Insufficient documentation

## 2020-04-19 DIAGNOSIS — Z20822 Contact with and (suspected) exposure to covid-19: Secondary | ICD-10-CM | POA: Diagnosis not present

## 2020-04-19 DIAGNOSIS — L89302 Pressure ulcer of unspecified buttock, stage 2: Secondary | ICD-10-CM

## 2020-04-19 DIAGNOSIS — E1122 Type 2 diabetes mellitus with diabetic chronic kidney disease: Secondary | ICD-10-CM | POA: Diagnosis present

## 2020-04-19 DIAGNOSIS — R1310 Dysphagia, unspecified: Secondary | ICD-10-CM | POA: Diagnosis present

## 2020-04-19 DIAGNOSIS — E860 Dehydration: Secondary | ICD-10-CM | POA: Diagnosis present

## 2020-04-19 DIAGNOSIS — J9601 Acute respiratory failure with hypoxia: Secondary | ICD-10-CM | POA: Diagnosis present

## 2020-04-19 DIAGNOSIS — J189 Pneumonia, unspecified organism: Secondary | ICD-10-CM | POA: Diagnosis not present

## 2020-04-19 DIAGNOSIS — J386 Stenosis of larynx: Secondary | ICD-10-CM | POA: Diagnosis not present

## 2020-04-19 DIAGNOSIS — Z833 Family history of diabetes mellitus: Secondary | ICD-10-CM

## 2020-04-19 DIAGNOSIS — I129 Hypertensive chronic kidney disease with stage 1 through stage 4 chronic kidney disease, or unspecified chronic kidney disease: Secondary | ICD-10-CM | POA: Diagnosis present

## 2020-04-19 DIAGNOSIS — Y842 Radiological procedure and radiotherapy as the cause of abnormal reaction of the patient, or of later complication, without mention of misadventure at the time of the procedure: Secondary | ICD-10-CM | POA: Diagnosis present

## 2020-04-19 DIAGNOSIS — I2510 Atherosclerotic heart disease of native coronary artery without angina pectoris: Secondary | ICD-10-CM | POA: Diagnosis not present

## 2020-04-19 DIAGNOSIS — R079 Chest pain, unspecified: Secondary | ICD-10-CM | POA: Diagnosis not present

## 2020-04-19 DIAGNOSIS — R0789 Other chest pain: Secondary | ICD-10-CM

## 2020-04-19 DIAGNOSIS — R339 Retention of urine, unspecified: Secondary | ICD-10-CM | POA: Diagnosis present

## 2020-04-19 DIAGNOSIS — Z79899 Other long term (current) drug therapy: Secondary | ICD-10-CM

## 2020-04-19 DIAGNOSIS — Z8249 Family history of ischemic heart disease and other diseases of the circulatory system: Secondary | ICD-10-CM

## 2020-04-19 DIAGNOSIS — Z930 Tracheostomy status: Secondary | ICD-10-CM | POA: Diagnosis not present

## 2020-04-19 DIAGNOSIS — J700 Acute pulmonary manifestations due to radiation: Secondary | ICD-10-CM | POA: Diagnosis present

## 2020-04-19 DIAGNOSIS — Z823 Family history of stroke: Secondary | ICD-10-CM

## 2020-04-19 DIAGNOSIS — E119 Type 2 diabetes mellitus without complications: Secondary | ICD-10-CM

## 2020-04-19 DIAGNOSIS — Z87891 Personal history of nicotine dependence: Secondary | ICD-10-CM

## 2020-04-19 DIAGNOSIS — R410 Disorientation, unspecified: Secondary | ICD-10-CM | POA: Diagnosis not present

## 2020-04-19 DIAGNOSIS — Z7982 Long term (current) use of aspirin: Secondary | ICD-10-CM

## 2020-04-19 DIAGNOSIS — J9811 Atelectasis: Secondary | ICD-10-CM | POA: Diagnosis not present

## 2020-04-19 DIAGNOSIS — I959 Hypotension, unspecified: Secondary | ICD-10-CM | POA: Diagnosis not present

## 2020-04-19 DIAGNOSIS — Z801 Family history of malignant neoplasm of trachea, bronchus and lung: Secondary | ICD-10-CM

## 2020-04-19 DIAGNOSIS — K219 Gastro-esophageal reflux disease without esophagitis: Secondary | ICD-10-CM | POA: Diagnosis present

## 2020-04-19 DIAGNOSIS — N179 Acute kidney failure, unspecified: Principal | ICD-10-CM | POA: Diagnosis present

## 2020-04-19 DIAGNOSIS — J90 Pleural effusion, not elsewhere classified: Secondary | ICD-10-CM | POA: Diagnosis not present

## 2020-04-19 LAB — CBC WITH DIFFERENTIAL/PLATELET
Abs Immature Granulocytes: 0.03 10*3/uL (ref 0.00–0.07)
Basophils Absolute: 0 10*3/uL (ref 0.0–0.1)
Basophils Relative: 0 %
Eosinophils Absolute: 0.1 10*3/uL (ref 0.0–0.5)
Eosinophils Relative: 1 %
HCT: 40.8 % (ref 39.0–52.0)
Hemoglobin: 13.7 g/dL (ref 13.0–17.0)
Immature Granulocytes: 0 %
Lymphocytes Relative: 13 %
Lymphs Abs: 1.1 10*3/uL (ref 0.7–4.0)
MCH: 31.4 pg (ref 26.0–34.0)
MCHC: 33.6 g/dL (ref 30.0–36.0)
MCV: 93.4 fL (ref 80.0–100.0)
Monocytes Absolute: 0.9 10*3/uL (ref 0.1–1.0)
Monocytes Relative: 10 %
Neutro Abs: 6.5 10*3/uL (ref 1.7–7.7)
Neutrophils Relative %: 76 %
Platelets: 383 10*3/uL (ref 150–400)
RBC: 4.37 MIL/uL (ref 4.22–5.81)
RDW: 12.6 % (ref 11.5–15.5)
WBC: 8.6 10*3/uL (ref 4.0–10.5)
nRBC: 0 % (ref 0.0–0.2)

## 2020-04-19 LAB — LACTIC ACID, PLASMA: Lactic Acid, Venous: 1.2 mmol/L (ref 0.5–1.9)

## 2020-04-19 LAB — COMPREHENSIVE METABOLIC PANEL
ALT: 25 U/L (ref 0–44)
AST: 27 U/L (ref 15–41)
Albumin: 3.1 g/dL — ABNORMAL LOW (ref 3.5–5.0)
Alkaline Phosphatase: 60 U/L (ref 38–126)
Anion gap: 12 (ref 5–15)
BUN: 82 mg/dL — ABNORMAL HIGH (ref 8–23)
CO2: 26 mmol/L (ref 22–32)
Calcium: 9.2 mg/dL (ref 8.9–10.3)
Chloride: 94 mmol/L — ABNORMAL LOW (ref 98–111)
Creatinine, Ser: 4.58 mg/dL — ABNORMAL HIGH (ref 0.61–1.24)
GFR, Estimated: 12 mL/min — ABNORMAL LOW (ref >60)
Glucose, Bld: 109 mg/dL — ABNORMAL HIGH (ref 70–99)
Potassium: 4.7 mmol/L (ref 3.5–5.1)
Sodium: 132 mmol/L — ABNORMAL LOW (ref 135–145)
Total Bilirubin: 1.1 mg/dL (ref 0.3–1.2)
Total Protein: 8 g/dL (ref 6.5–8.1)

## 2020-04-19 LAB — PROTIME-INR
INR: 1.3 — ABNORMAL HIGH (ref 0.8–1.2)
Prothrombin Time: 15.7 seconds — ABNORMAL HIGH (ref 11.4–15.2)

## 2020-04-19 MED ORDER — SODIUM CHLORIDE 0.9 % IV BOLUS
1000.0000 mL | Freq: Once | INTRAVENOUS | Status: AC
  Administered 2020-04-19: 1000 mL via INTRAVENOUS

## 2020-04-19 MED ORDER — MORPHINE SULFATE (PF) 2 MG/ML IV SOLN: 1.0000 mg | INTRAVENOUS | Status: DC | PRN

## 2020-04-19 MED ORDER — SODIUM CHLORIDE 0.9 % IV BOLUS
500.0000 mL | Freq: Once | INTRAVENOUS | Status: AC
  Administered 2020-04-19: 500 mL via INTRAVENOUS

## 2020-04-19 MED ORDER — ONDANSETRON HCL 4 MG/2ML IJ SOLN
4.0000 mg | Freq: Four times a day (QID) | INTRAMUSCULAR | Status: DC | PRN
  Administered 2020-04-21: 4 mg via INTRAVENOUS
  Filled 2020-04-19: qty 2

## 2020-04-19 NOTE — ED Triage Notes (Signed)
EMS reports pt is from home. Diagnosed with lung cancer 6 months ago. Had a trach placed about a month ago. Having pain around trach and also to left side of chest. Temp 99.9 temporal. HR 77, O2 100% on 8lpm via blow by. BP - 112/56.

## 2020-04-19 NOTE — ED Notes (Signed)
RN made Dr. Vanita Panda aware of pt BP

## 2020-04-19 NOTE — ED Notes (Signed)
RT at bedside assessing patient

## 2020-04-19 NOTE — ED Provider Notes (Signed)
[facility] EMERGENCY DEPARTMENT Provider Note   CSN: [id number] Arrival date & time: 04/19/20  1938     History Chief Complaint  Patient presents with  . Chest Pain    Shaun Kennedy is a 80 y.o. male.  HPI Patient presents about 1 week after recent discharge, now with concern for discomfort about his upper chest and tracheostomy site and ongoing dyspnea. Patient's history includes a lung cancer, with trach placement within the past few weeks. Seemingly, patient developed new dyspnea and tracheostomy area discomfort today, left-sided chest pain as well.  Pain is seemingly nonradiating.  No report of fever, vomiting, fall or other notable changes. Once he arrived, was established in the hospital gurney, and trach collar oxygen was provided he stated that he felt better.  From physician discharge summary of April 15, 2020: "Hospital Course: 80 year old male with lung cancer who developed worsening difficulty breathing with exertion found to be due to glottic narrowing and immobile vocal folds.  After considering options for management, he elected to proceed with tracheostomy.  See operative note.  He was observed after surgery in ICU and did well.  His diet was advanced under guidance from speech pathology and the cuff of his trach tube was able to be deflated.  He tolerated a Passy-Muir valve.  His hospital stay was uneventful.  On POD 5, his trach tube was changed to a cuffless #6 flexible Shiley tube without difficulty and the family was given intensive instruction on trach care with the help of respiratory therapy.  He is felt stable for discharge home."    Per oncology office notes: DIAGNOSIS:stage IV (T3, N2, M1 a) non-small cell lung cancer, squamous cell carcinoma presented with obstructive left lower lobe lung mass in addition to mediastinal lymphadenopathy as well as bilateral pulmonary nodules diagnosed in July 2021.  PDL1:0%  PRIOR  THERAPY:None  CURRENT THERAPY: 1) Palliative radiotherapy to the obstructive left lower lobe lung mass under the care of Dr. Lisbeth Renshaw.  2) systemic chemotherapy 2 cycles of chemotherapy with carboplatin for an AUC of5and paclitaxel175 mg/m2in addition to immunotherapy with nivolumab360 mgevery 3 weeks and ipilimumab1 mg/kgIV every 6 weeksfollowed by maintenance nivolumab and ipilimumab.He started the first treatment on 09/04/2019.  He is status post 5 cycles of the treatment.  INTERVAL HISTORY: Shaun Kennedy 80 y.o. male returns to the clinic today for follow-up visit.  The patient is feeling fine today with no concerning complaints except for left rib pain started around 2 weeks ago.  He was started recently on prednisone taper by his primary care physician and he almost completed this course.  He denied having any current shortness of breath, cough or hemoptysis.  He denied having any fever or chills.  He has no nausea, vomiting, diarrhea or constipation.  He denied having any headache or visual changes.  He has no significant weight loss or night sweats.  He is here today for evaluation before starting day 1 of cycle #6.  Past Medical History:  Diagnosis Date  . Allergic rhinitis   . Asthma   . Carotid stenosis   . Colon cancer ([place]) 2003  . Diabetes mellitus ([place])   . Diverticulosis   . Dyslipidemia   . ED (erectile dysfunction)   . GERD (gastroesophageal reflux disease)   . H/O degenerative disc disease   . Hemorrhoids   . HTN (hypertension)   . Hyperlipidemia   . Lung cancer ([place])   . LVH (left ventricular hypertrophy)  on EKG  . Mass of lower lobe of left lung   . Mediastinal adenopathy   . Smoker    former  . Wears dentures   . Wears dentures   . Wears glasses   . Wears glasses     Patient Active Problem List   Diagnosis Date Noted  . Pleuritic chest pain 03/17/2020  . Chemotherapy-induced neuropathy ([place]) 02/12/2020  . Vocal cord dysfunction 02/12/2020   . 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 Stridor 12/18/2019  . Abnormal findings on diagnostic imaging of lung 11/06/2019  . Antineoplastic chemotherapy induced anemia 10/16/2019  . Shortness of breath 10/15/2019  . Odynophagia 09/25/2019  . Port-A-Cath in place 09/17/2019  . Decreased appetite 09/03/2019  . Malignant neoplasm of lower lobe of left lung ([place]) 08/28/2019  . Encounter for antineoplastic chemotherapy 08/24/2019  . Encounter for antineoplastic immunotherapy 08/24/2019  . Goals of care, counseling/discussion 08/24/2019  . Atherosclerosis of aorta ([place]) 04/09/2019  . Carpal tunnel syndrome, left upper limb 02/19/2019  . History of carpal tunnel surgery of right wrist 12/21/2018  . OAB (overactive bladder) 10/11/2017  . Diabetic nephropathy associated with type 2 diabetes mellitus ([place]) 10/11/2017  . Onychomycosis of multiple toenails with type 2 diabetes mellitus ([place]) 07/22/2015  . Former smoker, stopped smoking in distant past 07/22/2015  . Diabetes mellitus ([place]) 06/13/2011  . ED (erectile dysfunction) 06/13/2011  . History of prostate cancer 06/13/2011  . Hypertension associated with diabetes ([place]) 06/13/2011  . Hyperlipidemia associated with type 2 diabetes mellitus ([place]) 06/13/2011  . Allergic rhinitis, mild 06/13/2011  . History of asthma 06/13/2011  . Carotid stenosis 06/13/2011    Past Surgical History:  Procedure Laterality Date  . BRONCHIAL BIOPSY  08/20/2019   Procedure: BRONCHIAL BIOPSIES;  Surgeon: Collene Gobble, MD;  Location: [facility] ENDOSCOPY;  Service: Pulmonary;;  . BRONCHIAL BRUSHINGS  08/20/2019   Procedure: BRONCHIAL BRUSHINGS;  Surgeon: Collene Gobble, MD;  Location: [facility] ENDOSCOPY;  Service: Pulmonary;;  . BRONCHIAL NEEDLE ASPIRATION BIOPSY  08/20/2019   Procedure: BRONCHIAL NEEDLE ASPIRATION BIOPSIES;  Surgeon: Collene Gobble, MD;  Location[facility] ENDOSCOPY;  Service: Pulmonary;;  . CARPAL TUNNEL RELEASE Right 01/09/2019   Procedure: CARPAL TUNNEL RELEASE;   Surgeon: Leandrew Koyanagi, MD;  Location: [facility];  Service: Orthopedics;  Laterality: Right;  . CATARACT EXTRACTION Right 2018  . COLONOSCOPY  2007   Dr. Benson Norway  . DIRECT LARYNGOSCOPY N/A 04/10/2020   Procedure: DIRECT LARYNGOSCOPY;  Surgeon: Melida Quitter, MD;  Location: [facility];  Service: ENT;  Laterality: N/A;  . I & D EXTREMITY Right 04/02/2016   Procedure: IRRIGATION AND DEBRIDEMENT GREAT TOE;  Surgeon: Leandrew Koyanagi, MD;  Location: [facility];  Service: Orthopedics;  Laterality: Right;  . IR IMAGING GUIDED PORT INSERTION  08/30/2019  . MULTIPLE TOOTH EXTRACTIONS    . RADIOACTIVE SEED IMPLANT  2003  . TRACHEOSTOMY TUBE PLACEMENT N/A 04/10/2020   Procedure: TRACHEOSTOMY;  Surgeon: Melida Quitter, MD;  Location: [facility];  Service: ENT;  Laterality: N/A;  . ULNAR TUNNEL RELEASE Right 01/09/2019   Procedure: RIGHT CUBITAL TUNNEL RELEASE AND CARPAL TUNNEL RELEASE;  Surgeon: Leandrew Koyanagi, MD;  Location: [facility];  Service: Orthopedics;  Laterality: Right;  . UPPER GASTROINTESTINAL ENDOSCOPY    . VIDEO BRONCHOSCOPY N/A 12/18/2019   Procedure: VIDEO BRONCHOSCOPY WITHOUT FLUORO;  Surgeon: Collene Gobble, MD;  Location: [facility] ENDOSCOPY;  Service: Cardiopulmonary;  Laterality: N/A;  . VIDEO BRONCHOSCOPY WITH ENDOBRONCHIAL ULTRASOUND N/A 08/20/2019  Procedure: VIDEO BRONCHOSCOPY WITH ENDOBRONCHIAL ULTRASOUND;  Surgeon: Collene Gobble, MD;  Location: [facility] ENDOSCOPY;  Service: Pulmonary;  Laterality: N/A;       Family History  Problem Relation Age of Onset  . Heart failure Mother   . Diabetes Mother   . Stroke Father   . Diabetes Father   . Diabetes Sister   . Diabetes Sister   . Lung cancer Sister 56  . Colon cancer Neg Hx   . Esophageal cancer Neg Hx   . Rectal cancer Neg Hx   . Colon polyps Neg Hx   . Stomach cancer Neg Hx     Social History   Tobacco Use  . Smoking status: Former Smoker    Packs/day: 1.00    Years: 32.00    Pack years: 32.00    Start date:  02/08/1955    Quit date: 10/09/1987    Years since quitting: 32.5  . Smokeless tobacco: Never Used  Vaping Use  . Vaping Use: Never used  Substance Use Topics  . Alcohol use: No  . Drug use: No    Home Medications Prior to Admission medications   Medication Sig Start Date End Date Taking? Authorizing Provider  amLODipine (NORVASC) 5 MG tablet Take 1 tablet (5 mg total) by mouth daily. 04/07/20   [name], MD  aspirin EC 81 MG tablet Take 81 mg by mouth daily after breakfast.     [provider]  Blood Glucose Monitoring Suppl (ONETOUCH VERIO) w/Device KIT USE TO CHECK SUGAR DAILY 02/21/17   [name], MD  cholecalciferol (VITAMIN D) 1000 units tablet Take 1,000 Units by mouth daily after breakfast.     [provider]  dronabinol (MARINOL) 2.5 MG capsule TAKE 1 CAPSULE BY MOUTH 2 TIMES DAILY BEFORE A MEAL. Patient not taking: No sig reported 12/26/19   [name], MD  ferrous sulfate 325 (65 FE) MG EC tablet Take 1 tablet (325 mg total) by mouth daily with breakfast. 04/07/20   [name], MD  gabapentin (NEURONTIN) 100 MG capsule Take 1 capsule (100 mg total) by mouth 3 (three) times daily. Patient not taking: No sig reported 12/03/19   Curt Bears, MD  guaiFENesin-codeine 100-10 MG/5ML syrup TAKE 5ML BY MOUTH 3 TIMES A DAY AS NEEDED FOR COUGH 10/21/19   Tanner, Lyndon Code., PA-C  HYDROcodone-acetaminophen (NORCO/VICODIN) 5-325 MG tablet Take 1-2 tablets by mouth every 6 (six) hours as needed for moderate pain. 04/15/20   Melida Quitter, MD  Lancets (ONETOUCH DELICA PLUS [id number]) [place] PATIENT IS TO TEST TWO TIMES A DAY DX: E11.9 (ONETOUCH VERIO FLEX) 04/23/19   [name], MD  lidocaine-prilocaine (EMLA) cream Apply to the Port-A-Cath site 30-60-minute before chemotherapy. Patient not taking: No sig reported 08/24/19   Curt Bears, MD  lisinopril-hydrochlorothiazide (ZESTORETIC) 20-12.5 MG tablet Take 1 tablet by mouth daily. 04/07/20   [name], MD  metFORMIN (GLUCOPHAGE-XR) 500 MG 24 hr tablet Take 1 tablet (500 mg total) by mouth daily. 04/07/20   [name], MD  mirtazapine (REMERON) 15 MG tablet TAKE 1 TABLET BY MOUTH EVERYDAY AT BEDTIME Patient not taking: Reported on 04/01/2020 01/01/20   Heilingoetter, Cassandra L, PA-C  Multiple Vitamin (MULTIVITAMIN WITH MINERALS) TABS tablet Take 1 tablet by mouth daily after breakfast.     [provider]  [facility] VERIO test strip USE AS INSTRUCTED TO [place] 10/07/19   [name], MD  simvastatin (ZOCOR) 40 MG  tablet Take 1 tablet (40 mg total) by mouth daily. 04/07/20   [name], MD    Allergies    Patient has no known allergies.  Review of Systems   Review of Systems  Constitutional:       Per HPI, otherwise negative  HENT:       Per HPI, otherwise negative  Respiratory:       Per HPI, otherwise negative  Cardiovascular:       Per HPI, otherwise negative  Gastrointestinal: Negative for vomiting.  Endocrine:       Negative aside from HPI  Genitourinary:       Neg aside from HPI   Musculoskeletal:       Per HPI, otherwise negative  Skin: Negative.   Allergic/Immunologic: Positive for immunocompromised state.  Neurological: Negative for syncope.    Physical Exam Updated Vital Signs BP (!) 87/43 (BP Location: Left Arm)   Pulse 87   Temp 98.8 F (37.1 C) (Oral)   Resp 20   Ht '5\' 7"'  (1.702 m)   Wt 77.1 kg   SpO2 100%   BMI 26.63 kg/m   Physical Exam Vitals and nursing note reviewed.  Constitutional:      General: He is not in acute distress.    Appearance: He is well-developed.  HENT:     Head: Normocephalic and atraumatic.   Eyes:     Conjunctiva/sclera: Conjunctivae normal.  Cardiovascular:     Rate and Rhythm: Normal rate and regular rhythm.  Pulmonary:     Effort: Pulmonary effort is normal. No respiratory distress.     Breath sounds: No stridor.  Abdominal:     General: There is no distension.  Skin:    General:  Skin is warm and dry.  Neurological:     Mental Status: He is alert and oriented to person, place, and time.     ED Results / Procedures / Treatments   Labs (all labs ordered are listed, but only abnormal results are displayed) Labs Reviewed  CULTURE, BLOOD (ROUTINE X 2)  CULTURE, BLOOD (ROUTINE X 2)  LACTIC ACID, PLASMA  LACTIC ACID, PLASMA  COMPREHENSIVE METABOLIC PANEL  CBC WITH DIFFERENTIAL/PLATELET  URINALYSIS, ROUTINE W REFLEX MICROSCOPIC  PROTIME-INR    EKG None  Radiology No results found.  Procedures Procedures   Medications Ordered in ED Medications - No data to display  ED Course  I have reviewed the triage vital signs and the nursing notes.  Pertinent labs & imaging results that were available during my care of the patient were reviewed by me and considered in my medical decision making (see chart for details).   Initial evaluation patient was placed on continuous cardiac monitor, pulse oximetry. Patient found to have low blood pressure, 80/50, fluid resuscitation started. Pulse oximetry 95% with trach collar.  Abnormal Cardiac 80s, sinus unremarkable   Update: I discussed patient's x-ray, with our radiologist.  It is abnormal with concern for increased left upper lobe consolidation.  CT with contrast ordered.  9:26 PM Patient's initial labs notable for acute kidney injury with creatinine greater than 4 whereas it was 1 last week. He has been receiving fluid resuscitation, will continue to do so. Given this acute kidney injury, concern for his dyspnea, he will require admission for further monitoring, management.  10:24 PM Patient returned from CT. Have reviewed these results, no evidence for new pneumonia.  Some changes consistent with therapy. Patient has continued receive fluid resuscitation, blood pressure has improved.  Some suspicion for the patient's AKI being secondary to dehydration, similarly hypotension secondary to this. Given his  improvement here with fluid resuscitation, he will still require admission, but may be appropriate for telemetry.  No current evidence for atypical ACS, no pneumonia, obvious PE, the patient not a candidate for angiography given his renal dysfunction.  MDM Rules/Calculators/A&P MDM Number of Diagnoses or Management Options AKI (acute kidney injury) ([place]): new, needed workup Atypical chest pain: new, needed workup   Amount and/or Complexity of Data Reviewed Clinical lab tests: reviewed Tests in the radiology section of CPT: reviewed Tests in the medicine section of CPT: reviewed Discussion of test results with the performing providers: yes Decide to obtain previous medical records or to obtain history from someone other than the patient: yes Obtain history from someone other than the patient: yes Review and summarize past medical records: yes Discuss the patient with other providers: yes Independent visualization of images, tracings, or specimens: yes  Risk of Complications, Morbidity, and/or Mortality Presenting problems: high Diagnostic procedures: high Management options: high  Critical Care Total time providing critical care: 30-74 minutes (35)  Patient Progress Patient progress: stable   Final Clinical Impression(s) / ED Diagnoses Final diagnoses:  Atypical chest pain  AKI (acute kidney injury) ([place])     Carmin Muskrat, MD 04/19/20 2226

## 2020-04-20 ENCOUNTER — Encounter: Payer: Self-pay | Admitting: Internal Medicine

## 2020-04-20 ENCOUNTER — Inpatient Hospital Stay (HOSPITAL_COMMUNITY): Payer: Medicare HMO

## 2020-04-20 DIAGNOSIS — L8990 Pressure ulcer of unspecified site, unspecified stage: Secondary | ICD-10-CM | POA: Insufficient documentation

## 2020-04-20 DIAGNOSIS — I9589 Other hypotension: Secondary | ICD-10-CM | POA: Diagnosis present

## 2020-04-20 DIAGNOSIS — J189 Pneumonia, unspecified organism: Secondary | ICD-10-CM | POA: Diagnosis present

## 2020-04-20 DIAGNOSIS — N179 Acute kidney failure, unspecified: Secondary | ICD-10-CM | POA: Diagnosis not present

## 2020-04-20 DIAGNOSIS — E861 Hypovolemia: Secondary | ICD-10-CM | POA: Diagnosis present

## 2020-04-20 LAB — LACTIC ACID, PLASMA: Lactic Acid, Venous: 0.7 mmol/L (ref 0.5–1.9)

## 2020-04-20 LAB — URINALYSIS, COMPLETE (UACMP) WITH MICROSCOPIC
Bacteria, UA: NONE SEEN
Bilirubin Urine: NEGATIVE
Glucose, UA: NEGATIVE mg/dL
Hgb urine dipstick: NEGATIVE
Ketones, ur: 5 mg/dL — AB
Leukocytes,Ua: NEGATIVE
Nitrite: NEGATIVE
Protein, ur: NEGATIVE mg/dL
Specific Gravity, Urine: 1.018 (ref 1.005–1.030)
pH: 5 (ref 5.0–8.0)

## 2020-04-20 LAB — GLUCOSE, CAPILLARY
Glucose-Capillary: 93 mg/dL (ref 70–99)
Glucose-Capillary: 81 mg/dL (ref 70–99)
Glucose-Capillary: 68 mg/dL — ABNORMAL LOW (ref 70–99)
Glucose-Capillary: 106 mg/dL — ABNORMAL HIGH (ref 70–99)
Glucose-Capillary: 94 mg/dL (ref 70–99)
Glucose-Capillary: 89 mg/dL (ref 70–99)
Glucose-Capillary: 110 mg/dL — ABNORMAL HIGH (ref 70–99)

## 2020-04-20 LAB — BASIC METABOLIC PANEL
Anion gap: 10 (ref 5–15)
BUN: 81 mg/dL — ABNORMAL HIGH (ref 8–23)
CO2: 24 mmol/L (ref 22–32)
Calcium: 8.5 mg/dL — ABNORMAL LOW (ref 8.9–10.3)
Chloride: 100 mmol/L (ref 98–111)
Creatinine, Ser: 3.75 mg/dL — ABNORMAL HIGH (ref 0.61–1.24)
GFR, Estimated: 15 mL/min — ABNORMAL LOW (ref >60)
Glucose, Bld: 96 mg/dL (ref 70–99)
Potassium: 4.7 mmol/L (ref 3.5–5.1)
Sodium: 134 mmol/L — ABNORMAL LOW (ref 135–145)

## 2020-04-20 LAB — STREP PNEUMONIAE URINARY ANTIGEN: Strep Pneumo Urinary Antigen: NEGATIVE

## 2020-04-20 LAB — SARS CORONAVIRUS 2 (TAT 6-24 HRS): SARS Coronavirus 2: NEGATIVE

## 2020-04-20 LAB — CREATININE, URINE, RANDOM: Creatinine, Urine: 319.09 mg/dL

## 2020-04-20 LAB — MAGNESIUM: Magnesium: 2.5 mg/dL — ABNORMAL HIGH (ref 1.7–2.4)

## 2020-04-20 LAB — PHOSPHORUS: Phosphorus: 6.1 mg/dL — ABNORMAL HIGH (ref 2.5–4.6)

## 2020-04-20 LAB — MRSA PCR SCREENING: MRSA by PCR: NEGATIVE

## 2020-04-20 LAB — SODIUM, URINE, RANDOM: Sodium, Ur: 41 mmol/L

## 2020-04-20 LAB — PROCALCITONIN: Procalcitonin: 0.34 ng/mL

## 2020-04-20 MED ORDER — INSULIN ASPART 100 UNIT/ML ~~LOC~~ SOLN: 0.0000 [IU] | SUBCUTANEOUS | Status: DC

## 2020-04-20 MED ORDER — ORAL CARE MOUTH RINSE
15.0000 mL | Freq: Two times a day (BID) | OROMUCOSAL | Status: DC
  Administered 2020-04-20 – 2020-04-23 (×5): 15 mL via OROMUCOSAL

## 2020-04-20 MED ORDER — HYDROMORPHONE HCL 1 MG/ML IJ SOLN
0.5000 mg | INTRAMUSCULAR | Status: DC | PRN
  Administered 2020-04-20 (×5): 0.5 mg via INTRAVENOUS
  Filled 2020-04-20 (×5): qty 0.5

## 2020-04-20 MED ORDER — SODIUM CHLORIDE 0.9 % IV SOLN
500.0000 mg | INTRAVENOUS | Status: DC
  Administered 2020-04-20 – 2020-04-22 (×3): 500 mg via INTRAVENOUS
  Filled 2020-04-20 (×3): qty 500

## 2020-04-20 MED ORDER — HEPARIN SODIUM (PORCINE) 5000 UNIT/ML IJ SOLN
5000.0000 [IU] | Freq: Three times a day (TID) | INTRAMUSCULAR | Status: DC
  Administered 2020-04-20 – 2020-04-23 (×10): 5000 [IU] via SUBCUTANEOUS
  Filled 2020-04-20 (×10): qty 1

## 2020-04-20 MED ORDER — CHLORHEXIDINE GLUCONATE CLOTH 2 % EX PADS
6.0000 | MEDICATED_PAD | Freq: Every day | CUTANEOUS | Status: DC
  Administered 2020-04-20 – 2020-04-23 (×3): 6 via TOPICAL

## 2020-04-20 MED ORDER — SODIUM CHLORIDE 0.9 % IV BOLUS
500.0000 mL | Freq: Once | INTRAVENOUS | Status: AC
  Administered 2020-04-20: 500 mL via INTRAVENOUS

## 2020-04-20 MED ORDER — DEXTROSE 50 % IV SOLN
INTRAVENOUS | Status: AC
  Administered 2020-04-20: 12.5 g via INTRAVENOUS
  Filled 2020-04-20: qty 50

## 2020-04-20 MED ORDER — DEXTROSE 50 % IV SOLN: 12.5000 g | INTRAVENOUS | Status: AC

## 2020-04-20 MED ORDER — OXYCODONE HCL 5 MG PO TABS
5.0000 mg | ORAL_TABLET | ORAL | Status: DC | PRN
  Administered 2020-04-20: 5 mg via ORAL
  Filled 2020-04-20 (×2): qty 1

## 2020-04-20 MED ORDER — CHLORHEXIDINE GLUCONATE 0.12 % MT SOLN
15.0000 mL | Freq: Two times a day (BID) | OROMUCOSAL | Status: DC
  Administered 2020-04-20 – 2020-04-23 (×8): 15 mL via OROMUCOSAL
  Filled 2020-04-20 (×6): qty 15

## 2020-04-20 MED ORDER — SODIUM CHLORIDE 0.9 % IV SOLN
2.0000 g | INTRAVENOUS | Status: DC
  Administered 2020-04-20 – 2020-04-22 (×3): 2 g via INTRAVENOUS
  Filled 2020-04-20 (×3): qty 2

## 2020-04-20 MED ORDER — SODIUM CHLORIDE 0.9 % IV SOLN: INTRAVENOUS | Status: AC

## 2020-04-20 MED ORDER — LIDOCAINE HCL URETHRAL/MUCOSAL 2 % EX GEL
1.0000 "application " | Freq: Once | CUTANEOUS | Status: DC
  Filled 2020-04-20: qty 11

## 2020-04-20 MED ORDER — SENNOSIDES-DOCUSATE SODIUM 8.6-50 MG PO TABS
1.0000 | ORAL_TABLET | Freq: Two times a day (BID) | ORAL | Status: DC
  Administered 2020-04-20 – 2020-04-21 (×2): 1 via ORAL
  Filled 2020-04-20 (×2): qty 1

## 2020-04-20 NOTE — Progress Notes (Signed)
Discussed with Dr. Redmond Baseman who reviewed imaging CT and MBS.  Recommended pain management, ice chips and clear liquid diet for now, and to advance diet as tolerated.  Aspirations precautions in place.

## 2020-04-20 NOTE — Progress Notes (Signed)
PROGRESS NOTE  Shaun Kennedy PPI:[id number] DOB: [birth year] DOA: 04/19/2020 PCP: [name], MD  HPI/Recap of past 24 hours: Shaun Kennedy is a 80 y.o. 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  Patient was hospitalized recently, found to have glottic stenosis, and had tracheostomy on 04/10/2020, returned home on 04/15/2020, and has had difficulty with pain at the tracheostomy that has been uncontrolled despite hydrocodone, difficulty eating or drinking, and family has been concerned that they may not be providing optimal care of the tracheostomy.  Patient has been trying to eat some applesauce very slowly, but has pain with this and requires a lot of encouragement from family to try to consume just a few bites of applesauce.  He has been taking his medications as prescribed.  Family has called EMS out to the house 4 times since the recent discharge for help.  Patient has grown progressively fatigued.  He has had some left rib pain that has been coming and going over the past month or so.  Has not noticed any fever or chills.  ED Course: Upon arrival to the ED, patient is found to be afebrile, saturating upper 90s on 5 L/min of supplemental oxygen, mildly tachypneic, and with blood pressure 83/47.  Chemistry panel notable for BUN of 82 and creatinine 4.58, baseline creatinine 1.1.  CT chest demonstrates decreased size of known left lower lobe cancer, stable subcentimeter nodules, post radiation change involving the medial aspect of the left lung, left lower lobe bronchial wall thickening and airspace disease concerning for inflammatory or infectious change, persistent subcarinal adenopathy, and interval tracheostomy  without evidence for complication.   04/20/20: Patient was seen and examined at his bedside this morning reports productive cough with light yellowish sputum.  Sputum culture has been ordered for analysis.  He is currently on Rocephin and azithromycin empirically for community-acquired pneumonia.  Reports difficulty swallowing.  No evidence of oral thrush on exam.  He was seen by speech therapist with recommendation for n.p.o.  Per speech therapist patient demonstrates signs of significant dysphagia with severe odynophagia as well as wet vocal quality, multiple swallows and coughing after minimal sips and bites of pure.  MBS completed.  ENT Dr. Redmond Baseman called for consult.    Assessment/Plan: Principal Problem:   Acute renal failure (ARF) (HCC) Active Problems:   Diabetes mellitus type II, non insulin dependent (HCC)   Malignant neoplasm of lower lobe of left lung (HCC)   Hypotension due to hypovolemia   Left lower lobe pneumonia   Pressure injury of skin  Severe dysphagia in the setting of glottic stenosis s/p tracheostomy by ENT Dr. Redmond Baseman on 04/10/2020 Seen by speech therapist, post MBS on 04/20/2020, possible edema around pharynx. Called Dr. Redmond Baseman for consult and left a voicemail message N.p.o. until passes swallow evaluation by speech therapist. Aspiration precautions. Supportive care.  Acute kidney injury  likely multifactorial, prerenal in the setting of dehydration, poor oral intake, NSAIDs and ACE inhibitor. - Presents with pain at site of recent tracheostomy, difficulty eating and drinking, and fatigue, and is found to have BUN 82 and SCr 4.58, up from 20 and 1.17 three weeks ago  - Likely prerenal azotemia in setting of hypovolemia and hypotension; has also continued ACE-i and using NSAID  -  Given 1.5 liters IVF in ED  - Continue to hold off home lisinopril and HCTZ. -Continue to avoid nephrotoxic toxic agents and dehydration. -Monitor urine output -Repeat renal panel in the  morning.  Community-acquired pneumonia, POA CT suggestive of possible infection left lower lobe He is currently on Rocephin and azithromycin, continue Follow sputum culture  Acute hypoxic respiratory failure suspect secondary to pneumonia, POA - Patient requiring 5 Lpm in ED, CT suggests possible infection in LLL but no fever or leukocytosis   - Culture blood and sputum, check strep pneumo and legionella antigens, start Rocephin and azithromycin  -Follow results.  Stage IV non-small cell lung cancer  - Followed at [facility] and undergoing palliative radiotherapy and chemotherapy    Resolved hypotension; hx of HTN  - Patient has hx of HTN, was hypotensive in ED  -Responded well to IV fluids. -Continue IV fluid hydration. -Continue to maintain MAP greater than 65. -Continue to hold off home antihypertensives. - Continue to closely monitor vital signs.  Impaired glucose tolerance. - A1c was 5.9% in January 2022  - Check CBGs and use low-intensity SSI if needed    Glottic stenosis; Status-post tracheostomy on 04/10/20 No apparent complication on CT in ED, but patient reports difficulty swallowing  - NPO for now, consult SLP, continue pain-control, trach care     DVT prophylaxis: sq heparin  3 times daily. Code Status: Full, confirmed with patient   Level of Care: Level of care: Progressive Family Communication: Discussed with wife by phone  Disposition Plan:  Patient is from: Home  Anticipated d/c is to: TBD      Consultants:  ENT, Dr. Redmond Baseman.  Procedures:  MBS on 04/20/2020  Antimicrobials:  Rocephin  Azithromycin.    Status is: Inpatient    Dispo:  Patient From: Home  Planned Disposition: Home with Health Care Svc  Anticipated discharge date: 04/22/2020, or when ENT signs off.  Medically stable for discharge: No, ongoing management of pneumonia and dysphagia.          Objective: Vitals:   04/20/20 0632 04/20/20 0734  04/20/20 0807 04/20/20 1123  BP:   (!) 91/41 (!) 128/51  Pulse:   72 95  Resp: 20 12 14  (!) 24  Temp:   98.3 F (36.8 C)   TempSrc:   Oral   SpO2:   97% 97%  Weight:      Height:        Intake/Output Summary (Last 24 hours) at 04/20/2020 1333 Last data filed at 04/20/2020 0416 Gross per 24 hour  Intake 1389.73 ml  Output 375 ml  Net 1014.73 ml   Filed Weights   04/19/20 1943 04/20/20 0119  Weight: 77.1 kg 75.1 kg    Exam:  . General: 80 y.o. year-old male chronically ill-appearing in no acute stress.  Alert and interactive. . Cardiovascular: Regular rate and rhythm with no rubs or gallops.  No thyromegaly or JVD noted.   Marland Kitchen Respiratory: Mild rales at bases.  No wheeze noted.  Poor inspiratory effort.  Trach collar in place.   . Abdomen: Soft nontender nondistended with normal bowel sounds x4 quadrants. . Musculoskeletal: No lower extremity edema bilaterally.   . Skin: No ulcerative lesions noted or rashes. . Psychiatry: Mood is appropriate for condition and setting   Data Reviewed: CBC: Recent Labs  Lab 04/19/20 2018  WBC 8.6  NEUTROABS 6.5  HGB 13.7  HCT 40.8  MCV 93.4  PLT 741   Basic Metabolic Panel: Recent  Labs  Lab 04/19/20 2018 04/20/20 0105  NA 132* 134*  K 4.7 4.7  CL 94* 100  CO2 26 24  GLUCOSE 109* 96  BUN 82* 81*  CREATININE 4.58* 3.75*  CALCIUM 9.2 8.5*  MG  --  2.5*  PHOS  --  6.1*   GFR: Estimated Creatinine Clearance: 14.4 mL/min (A) (by C-G formula based on SCr of 3.75 mg/dL (H)). Liver Function Tests: Recent Labs  Lab 04/19/20 2018  AST 27  ALT 25  ALKPHOS 60  BILITOT 1.1  PROT 8.0  ALBUMIN 3.1*   No results for input(s): LIPASE, AMYLASE in the last 168 hours. No results for input(s): AMMONIA in the last 168 hours. Coagulation Profile: Recent Labs  Lab 04/19/20 2018  INR 1.3*   Cardiac Enzymes: No results for input(s): CKTOTAL, CKMB, CKMBINDEX, TROPONINI in the last 168 hours. BNP (last 3 results) No results for  input(s): PROBNP in the last 8760 hours. HbA1C: No results for input(s): HGBA1C in the last 72 hours. CBG: Recent Labs  Lab 04/20/20 0107 04/20/20 0349 04/20/20 0809 04/20/20 0906 04/20/20 1229  GLUCAP 93 81 68* 106* 94   Lipid Profile: No results for input(s): CHOL, HDL, LDLCALC, TRIG, CHOLHDL, LDLDIRECT in the last 72 hours. Thyroid Function Tests: No results for input(s): TSH, T4TOTAL, FREET4, T3FREE, THYROIDAB in the last 72 hours. Anemia Panel: No results for input(s): VITAMINB12, FOLATE, FERRITIN, TIBC, IRON, RETICCTPCT in the last 72 hours. Urine analysis:    Component Value Date/Time   COLORURINE YELLOW 04/20/2020 0255   APPEARANCEUR HAZY (A) 04/20/2020 0255   LABSPEC 1.018 04/20/2020 0255   LABSPEC 1.010 09/18/2019 1406   PHURINE 5.0 04/20/2020 0255   GLUCOSEU NEGATIVE 04/20/2020 0255   HGBUR NEGATIVE 04/20/2020 0255   BILIRUBINUR NEGATIVE 04/20/2020 0255   BILIRUBINUR negative 09/18/2019 1406   BILIRUBINUR n 06/13/2011 0931   KETONESUR 5 (A) 04/20/2020 0255   PROTEINUR NEGATIVE 04/20/2020 0255   UROBILINOGEN negative 06/13/2011 0931   NITRITE NEGATIVE 04/20/2020 0255   LEUKOCYTESUR NEGATIVE 04/20/2020 0255   Sepsis Labs: @LABRCNTIP (procalcitonin:4,lacticidven:4)  ) Recent Results (from the past 240 hour(s))  MRSA PCR Screening     Status: None   Collection Time: 04/10/20  6:51 PM   Specimen: Nasal Mucosa; Nasopharyngeal  Result Value Ref Range Status   MRSA by PCR NEGATIVE NEGATIVE Final    Comment:        The GeneXpert MRSA Assay (FDA approved for NASAL specimens only), is one component of a comprehensive MRSA colonization surveillance program. It is not intended to diagnose MRSA infection nor to guide or monitor treatment for MRSA infections. Performed at [facility], [place] [address], [place], [place] [address]   Culture, blood (Routine x 2)     Status: None (Preliminary result)   Collection Time: 04/19/20  8:18 PM   Specimen: BLOOD   Result Value Ref Range Status   Specimen Description BLOOD RIGHT ARM  Final   Special Requests   Final    BOTTLES DRAWN AEROBIC AND ANAEROBIC Blood Culture results may not be optimal due to an excessive volume of blood received in culture bottles   Culture   Final    NO GROWTH < 12 HOURS Performed at [facility], [place] [address], [place], [place] [address]    Report Status PENDING  Incomplete  SARS CORONAVIRUS 2 (TAT 6-24 HRS) Nasopharyngeal Nasopharyngeal Swab     Status: None   Collection Time: 04/20/20 12:12 AM   Specimen: Nasopharyngeal  Swab  Result Value Ref Range Status   SARS Coronavirus 2 NEGATIVE NEGATIVE Final    Comment: (NOTE) SARS-CoV-2 target nucleic acids are NOT DETECTED.  The SARS-CoV-2 RNA is generally detectable in upper and lower respiratory specimens during the acute phase of infection. Negative results do not preclude SARS-CoV-2 infection, do not rule out co-infections with other pathogens, and should not be used as the sole basis for treatment or other patient management decisions. Negative results must be combined with clinical observations, patient history, and epidemiological information. The expected result is Negative.  Fact Sheet for Patients: SugarRoll.be  Fact Sheet for Healthcare Providers: https://www.woods-mathews.com/  This test is not yet approved or cleared by the [place] FDA and  has been authorized for detection and/or diagnosis of SARS-CoV-2 by FDA under an Emergency Use Authorization (EUA). This EUA will remain  in effect (meaning this test can be used) for the duration of the COVID-19 declaration under Se ction 564(b)(1) of the Act, 21 U.S.C. section 360bbb-3(b)(1), unless the authorization is terminated or revoked sooner.  Performed at [facility], [place] [address], [place], [place] [address]   MRSA PCR Screening     Status: None   Collection Time: 04/20/20  1:03 AM    Specimen: Nasopharyngeal  Result Value Ref Range Status   MRSA by PCR NEGATIVE NEGATIVE Final    Comment:        The GeneXpert MRSA Assay (FDA approved for NASAL specimens only), is one component of a comprehensive MRSA colonization surveillance program. It is not intended to diagnose MRSA infection nor to guide or monitor treatment for MRSA infections. Performed at [facility], [place] [address], [place], [place] [address]       Studies: DG Chest 2 View  Addendum Date: 04/19/2020   ADDENDUM REPORT: 04/19/2020 20:43 ADDENDUM: These results were called by telephone at the time of interpretation on 04/19/2020 at 8:43 pm to provider Carmin Muskrat , who verbally acknowledged these results. Electronically Signed   By: Zetta Bills M.D.   On: 04/19/2020 20:43   Result Date: 04/19/2020 CLINICAL DATA:  Chest pain, history of asthma and carotid stenosis. Also with history of lung cancer. EXAM: CHEST - 2 VIEW COMPARISON:  March 17, 2020 FINDINGS: RIGHT-sided Port-A-Cath terminates at the caval to atrial junction. Tracheostomy tube in place. This is new compared to previous imaging. Cardiomediastinal contours accentuated by portable technique and AP projection. Low lung volumes. Perihilar and LEFT upper lobe consolidative changes in the LEFT chest following treatment for lung cancer. Accentuation of aortic contour on the current study though portable projection limits assessment. RIGHT lower lobe consolidation. Leads project over the chest bilaterally. IMPRESSION: 1. Perihilar and LEFT upper lobe consolidative changes are similar to prior exam. 2. RIGHT lower lobe consolidative changes also present not as well evaluated as on previous studies. 3. Accentuated aortic contour may be related to increasing adjacent airspace disease due to superimposed pneumonia or increasing confluence of post treatment changes. In the setting of chest pain CT of the chest may be helpful to exclude acute aortic  process though this is favored to represent hilar disease and increasing confluence of post treatment changes. A call is out to the referring provider further discuss findings in the above case. Electronically Signed: By: Zetta Bills M.D. On: 04/19/2020 20:31   CT CHEST WO CONTRAST  Result Date: 04/19/2020 CLINICAL DATA:  Stage IV non-small cell lung cancer left lower lobe, recent tracheostomy, pain at tracheostomy site and  within the left chest. EXAM: CT CHEST WITHOUT CONTRAST TECHNIQUE: Multidetector CT imaging of the chest was performed following the standard protocol without IV contrast. COMPARISON:  03/06/2020 FINDINGS: Cardiovascular: Unenhanced imaging of the heart and great vessels demonstrates no pericardial effusion. Normal caliber of the thoracic aorta. Evaluation of the vascular lumen is limited without IV contrast. Extensive atherosclerosis of the aorta and coronary vasculature again noted. Right chest wall port via internal jugular approach tip within the superior vena cava. Mediastinum/Nodes: Tracheostomy tube identified, tip well above carina. No abnormalities at the tracheostomy site on this unenhanced exam. Thyroid and esophagus are grossly normal. There is persistent subcarinal adenopathy, though exact measurements are difficult due to the lack of IV contrast. No new adenopathy is visualized. Lungs/Pleura: Progressive consolidation within the medial aspect of the left lung consistent with post radiation change. The known left lower lobe lung cancer is obscured by the surrounding consolidation, measuring approximately 2.5 x 1.8 cm reference image 74/4. There is no evidence of residual cavitation. Subpleural left upper lobe pulmonary nodule measuring 5 mm image 48/4 unchanged. Left lower lobe subpleural pulmonary nodule measuring 8 mm image 105/4 unchanged. Trace left pleural effusion is again noted, without significant change. New left lower lobe bronchial wall thickening and scattered tree  in bud nodular opacities are likely related to underlying inflammation or infection. Minimal dependent right lower lobe atelectasis. Otherwise the right lung is clear. No evidence of pneumothorax. Upper Abdomen: No acute abnormality. Musculoskeletal: No acute or destructive bony lesions. Reconstructed images demonstrate no additional findings. IMPRESSION: 1. Decreased size of the known left lower lobe lung cancer, consistent with response to therapy. Resolution of the peripheral cavitation seen previously. 2. Stable subcentimeter left upper and left lower lobe pulmonary nodules. 3. Progressive consolidation within the medial aspect of the left lung consistent with post radiation change. 4. Left lower lobe bronchial wall thickening and scattered tree in bud nodular airspace disease, consistent with inflammatory or infectious change. 5. Persistent subcarinal adenopathy. Exact measurements of the enlarged lymph node are limited without IV contrast. 6. Interval tracheostomy, without evidence of complication. 7. Stable trace left pleural effusion. Electronically Signed   By: Randa Ngo M.D.   On: 04/19/2020 22:06   US RENAL  Result Date: 04/20/2020 CLINICAL DATA:  80 year old male with acute renal insufficiency. Lung cancer. EXAM: RENAL / URINARY TRACT ULTRASOUND COMPLETE COMPARISON:  CT Chest, Abdomen, and Pelvis 03/06/2020. chest CT without contrast 04/19/2020. FINDINGS: Right Kidney: Renal measurements: 10.0 x 3.3 x 5.0 cm = volume: 85 mL. Renal cortex echogenicity appears increased on images 3, 8. There is a solitary small benign upper pole parapelvic cyst redemonstrated. No hydronephrosis or solid right renal mass. Left Kidney: Renal measurements: 9.7 x 6.1 x 5.8 cm = volume: 179 mL. Left renal cortical echogenicity appears better preserved (image 17). No left hydronephrosis or renal lesion. Bladder: Decompressed, Foley catheter balloon visible. Other: None. IMPRESSION: 1. No acute renal finding.  Bladder  decompressed by Foley. 2. Decreased right renal size and increased cortical echogenicity suggesting sequelae of chronic medical renal disease. Left kidney appears more normal for age. Electronically Signed   By: Genevie Ann M.D.   On: 04/20/2020 10:55    Scheduled Meds: . chlorhexidine  15 mL Mouth Rinse BID  . heparin  5,000 Units Subcutaneous Q8H  . insulin aspart  0-6 Units Subcutaneous Q4H  . lidocaine  1 application Urethral Once  . mouth rinse  15 mL Mouth Rinse q12n4p    Continuous Infusions: .  sodium chloride Stopped (04/20/20 0414)  . azithromycin Stopped (04/20/20 0730)  . cefTRIAXone (ROCEPHIN)  IV Stopped (04/20/20 0402)     LOS: 1 day     Kayleen Memos, MD [facility] Pager [phone]  If 7PM-7AM, please contact night-coverage [email or web address] Password [email or web address] 04/20/2020, 1:33 PM

## 2020-04-20 NOTE — Telephone Encounter (Signed)
It looks like he has been admitted to the hospital. Will need to define his [place] needs, work on trach teaching before d/c to home.

## 2020-04-20 NOTE — Evaluation (Signed)
Clinical/Bedside Swallow Evaluation Patient Details  Name: Shaun Kennedy MRN: [id number] Date of Birth: 1940/05/04  Today's Date: 04/20/2020 Time: SLP Start Time (ACUTE ONLY): 0900 SLP Stop Time (ACUTE ONLY): 0915 SLP Time Calculation (min) (ACUTE ONLY): 15 min  Past Medical History:  Past Medical History:  Diagnosis Date  . Allergic rhinitis   . Asthma   . Carotid stenosis   . Colon cancer (HCC) 2003  . Diabetes mellitus (HCC)   . Diverticulosis   . Dyslipidemia   . ED (erectile dysfunction)   . GERD (gastroesophageal reflux disease)   . H/O degenerative disc disease   . Hemorrhoids   . HTN (hypertension)   . Hyperlipidemia   . Lung cancer (HCC)   . LVH (left ventricular hypertrophy)    on EKG  . Mass of lower lobe of left lung   . Mediastinal adenopathy   . Smoker    former  . Wears dentures   . Wears dentures   . Wears glasses   . Wears glasses    Past Surgical History:  Past Surgical History:  Procedure Laterality Date  . BRONCHIAL BIOPSY  08/20/2019   Procedure: BRONCHIAL BIOPSIES;  Surgeon: Leslye Peer, MD;  Location: [facility] ENDOSCOPY;  Service: Pulmonary;;  . BRONCHIAL BRUSHINGS  08/20/2019   Procedure: BRONCHIAL BRUSHINGS;  Surgeon: Leslye Peer, MD;  Location: [facility] ENDOSCOPY;  Service: Pulmonary;;  . BRONCHIAL NEEDLE ASPIRATION BIOPSY  08/20/2019   Procedure: BRONCHIAL NEEDLE ASPIRATION BIOPSIES;  Surgeon: Leslye Peer, MD;  Location: [facility] ENDOSCOPY;  Service: Pulmonary;;  . CARPAL TUNNEL RELEASE Right 01/09/2019   Procedure: CARPAL TUNNEL RELEASE;  Surgeon: Tarry Kos, MD;  Location: [facility];  Service: Orthopedics;  Laterality: Right;  . CATARACT EXTRACTION Right 2018  . COLONOSCOPY  2007   Dr. Elnoria Ripley  . DIRECT LARYNGOSCOPY N/A 04/10/2020   Procedure: DIRECT LARYNGOSCOPY;  Surgeon: Christia Reading, MD;  Location: [facility] OR;  Service: ENT;  Laterality: N/A;  . I & D EXTREMITY Right 04/02/2016   Procedure: IRRIGATION AND DEBRIDEMENT GREAT TOE;   Surgeon: Tarry Kos, MD;  Location: [facility] OR;  Service: Orthopedics;  Laterality: Right;  . IR IMAGING GUIDED PORT INSERTION  08/30/2019  . MULTIPLE TOOTH EXTRACTIONS    . RADIOACTIVE SEED IMPLANT  2003  . TRACHEOSTOMY TUBE PLACEMENT N/A 04/10/2020   Procedure: TRACHEOSTOMY;  Surgeon: Christia Reading, MD;  Location: [facility] OR;  Service: ENT;  Laterality: N/A;  . ULNAR TUNNEL RELEASE Right 01/09/2019   Procedure: RIGHT CUBITAL TUNNEL RELEASE AND CARPAL TUNNEL RELEASE;  Surgeon: Tarry Kos, MD;  Location: [facility];  Service: Orthopedics;  Laterality: Right;  . UPPER GASTROINTESTINAL ENDOSCOPY    . VIDEO BRONCHOSCOPY N/A 12/18/2019   Procedure: VIDEO BRONCHOSCOPY WITHOUT FLUORO;  Surgeon: Leslye Peer, MD;  Location: [facility] ENDOSCOPY;  Service: Cardiopulmonary;  Laterality: N/A;  . VIDEO BRONCHOSCOPY WITH ENDOBRONCHIAL ULTRASOUND N/A 08/20/2019   Procedure: VIDEO BRONCHOSCOPY WITH ENDOBRONCHIAL ULTRASOUND;  Surgeon: Leslye Peer, MD;  Location: [facility] ENDOSCOPY;  Service: Pulmonary;  Laterality: N/A;   HPI:  80 year old male with glottic stenosis and inspiratory stridor following a remote history of lung cancer (squamous cell carcinoma  with obstructive left lower lobe lung mass in addition to mediastinal lymphadenopathy as well as bilateral pulmonary nodules diagnosed in July 2021 with chemo and radiation) who was recently admitted on 04/10/20 for an elective tracheostomy.  He was discharged home with a Shiley cuffless trach  on 3/12.  He was able to tolerate regular solids and thin liquids and wear his PMSV all waking hours without difficulty. He did not report any pain on the day of d/c. He went home and started having pain at trach site particularly when swallowing. He drank thin liquids and applesauce but refused most foods at home. Pt readmitted with dehydration.   Assessment / Plan / Recommendation Clinical Impression  Pt demonstrates signs of significant dyspahgia with severe odynophagia  as well as wet vocal quality, multiple swallows and coughing after minimal sips and bites of puree. This is a totally different presentation since d/c on 3/12, just two days ago. Pt is still able to tolerate PMSV placement without changes in vital signs, or change in breathing patterns.  MBS warranted for instrumental assessment of swallowing and imaging of swallow mechanism. Pt to remain NPO in the meantime but can wear PMSV while awake for communication and secretion management. Hopeful for assistance from ENT as well. SLP Visit Diagnosis: Dysphagia, unspecified (R13.10)    Aspiration Risk  Severe aspiration risk;Risk for inadequate nutrition/hydration    Diet Recommendation NPO        Other  Recommendations     Follow up Recommendations        Frequency and Duration            Prognosis Prognosis for Safe Diet Advancement: Fair      Swallow Study   General HPI: 80 year old male with glottic stenosis and inspiratory stridor following a remote history of lung cancer (squamous cell carcinoma  with obstructive left lower lobe lung mass in addition to mediastinal lymphadenopathy as well as bilateral pulmonary nodules diagnosed in July 2021 with chemo and radiation) who was recently admitted on 04/10/20 for an elective tracheostomy.  He was discharged home with a Shiley cuffless trach  on 3/12. He was able to tolerate regular solids and thin liquids and wear his PMSV all waking hours without difficulty. He did not report any pain on the day of d/c. He went home and started having pain at trach site particularly when swallowing. He drank thin liquids and applesauce but refused most foods at home. Pt readmitted with dehydration. Type of Study: Bedside Swallow Evaluation Previous Swallow Assessment: 04/10/20 Diet Prior to this Study: NPO Temperature Spikes Noted: No Respiratory Status: Trach Collar Trach Size and Type: #6;Cuff;Deflated;With PMSV in place History of Recent Intubation: No Oral  Cavity Assessment: Within Functional Limits Oral Care Completed by SLP: No Oral Cavity - Dentition: Dentures, top;Dentures, bottom Vision: Functional for self-feeding Self-Feeding Abilities: Needs assist Patient Positioning: Upright in bed Baseline Vocal Quality: Hoarse;Low vocal intensity Volitional Cough: Weak Volitional Swallow: Able to elicit    Oral/Motor/Sensory Function Overall Oral Motor/Sensory Function: Within functional limits   Ice Chips Ice chips: Impaired Presentation: Spoon Pharyngeal Phase Impairments: Multiple swallows;Wet Vocal Quality;Throat Clearing - Immediate   Thin Liquid Thin Liquid: Impaired Presentation: Cup Pharyngeal  Phase Impairments: Wet Vocal Quality;Throat Clearing - Immediate;Cough - Delayed;Multiple swallows    Nectar Thick Nectar Thick Liquid: Not tested   Honey Thick Honey Thick Liquid: Not tested   Puree Puree: Impaired Presentation: Spoon Pharyngeal Phase Impairments: Wet Vocal Quality;Multiple swallows;Cough - Delayed   Solid     Solid: Impaired      Arlen Dupuis, [name] 04/20/2020,10:57 AM

## 2020-04-20 NOTE — Progress Notes (Signed)
HOSPITAL MEDICINE OVERNIGHT EVENT NOTE    Notified by nursing that patient has begun to exhibit increasing confusion since shortly after 830PM.  Chart reviewed, patient S/P recent tracheostomy placement.  Currently being treated for pneumonia and prerenal AKI.  Hospital course complicated by urinary retention S/P foley placement.  Patient evaluated at the bedside.  Neuro exam is nonfocal.  Patient is verbal but confused and disoriented.  Patient has coarse breath sounds with some intermittent wheezing on lung exam.    I believe delirium is multifactorial, likely exacerbated by recent oxycodone dosing given at 8pm.  Will continue to monitor, recommend Tylenol first for pain control prior to relying on further doses on opiates.    Vernelle Emerald  MD [facility]

## 2020-04-20 NOTE — Progress Notes (Signed)
Hypoglycemic Event  CBG: 68  Treatment: 12.5 g of D50  Symptoms: Lightheaded and drowsiness  Follow-up CBG: Time: 0906 CBG Result: 106  Possible Reasons for Event: Patient is [place]

## 2020-04-20 NOTE — Progress Notes (Addendum)
Modified Barium Swallow Progress Note  Patient Details  Name: Shaun Kennedy MRN: [id number] Date of Birth: Jun 27, 1940  Today's Date: 04/20/2020  Modified Barium Swallow completed.  Full report located under Chart Review in the Imaging Section.  Brief recommendations include the following:  Clinical Impression  Pt demonstrates severe odynophagia which makes him hesitate to swallow and at times he avoids swallowing by orally holding the bolus. This behavior leads to instances of poor timing with sensed aspiration before the swallow and also oral residue without swallow initaition to transit. When pt coughs to expectorate trace penetration pain is severe. There is no sign of weakness or primary pharyngeal impairment. There is a possiblity of mild edema in the area of the upper esophagus that impedes bolus flow. Largely it seems that laryngeal elevation for swallow pulls at trach site and makes swallowing severely painful. Attempted to view cervical/thoracic esophagus and trach site under fluoroscopy to see if any fistula could be identified,  but shadow of pts shoulder obscured the view. Recommend f/u with ENT. Pt to remain NPO until pain is better controlled. Pt may have ice in the meantime if he desires it.   Swallow Evaluation Recommendations   Recommended Consults: Consider ENT evaluation    Oral Care Recommendations: Oral care QID   Other Recommendations: Place PMSV during PO intake;Have oral suction available   Herbie Baltimore, MA [place] Pager [phone] Office [phone]  Lynann Beaver 04/20/2020,2:31 PM

## 2020-04-20 NOTE — H&P (Signed)
History and Physical    Shaun Kennedy MBW:[id number] DOB: 06/12/1940 DOA: 04/19/2020  PCP: [name], MD   Patient coming from: Home   Chief Complaint: Pain around tracheostomy, trouble eating and drinking, fatigue   HPI: Shaun Kennedy is a 80 y.o. 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  Patient was hospitalized recently, found to have glottic stenosis, and had tracheostomy on 04/10/2020, returned home on 04/15/2020, and has had difficulty with pain at the tracheostomy that has been uncontrolled despite hydrocodone, difficulty eating or drinking, and family has been concerned that they may not be providing optimal care of the tracheostomy.  Patient has been trying to eat some applesauce very slowly, but has pain with this and requires a lot of encouragement from family to try to consume just a few bites of applesauce.  He has been taking his medications as prescribed.  Family has called EMS out to the house 4 times since the recent discharge for help.  Patient has grown progressively fatigued.  He has had some left rib pain that has been coming and going over the past month or so.  Has not noticed any fever or chills.  ED Course: Upon arrival to the ED, patient is found to be afebrile, saturating upper 90s on 5 L/min of supplemental oxygen, mildly tachypneic, and with blood pressure 83/47.  Chemistry panel notable for BUN of 82 and creatinine 4.58.  CBC is unremarkable.  Lactic acid reassuringly normal.  CT chest demonstrates decreased size of known left lower lobe cancer, stable subcentimeter nodules, post radiation change involving the medial aspect of the left lung, left lower lobe bronchial wall thickening and  airspace disease concerning for inflammatory or infectious change, persistent subcarinal adenopathy, and interval tracheostomy without evidence for complication.  Patient was given 1.5 L of saline in the ED, blood cultures were collected, and Covid 19 PCR was sent but not yet resulted.  Review of Systems:  All other systems reviewed and apart from HPI, are negative.  Past Medical History:  Diagnosis Date  . Allergic rhinitis   . Asthma   . Carotid stenosis   . Colon cancer ([place]) 2003  . Diabetes mellitus ([place])   . Diverticulosis   . Dyslipidemia   . ED (erectile dysfunction)   . GERD (gastroesophageal reflux disease)   . H/O degenerative disc disease   . Hemorrhoids   . HTN (hypertension)   . Hyperlipidemia   . Lung cancer ([place])   . LVH (left ventricular hypertrophy)    on EKG  . Mass of lower lobe of left lung   . Mediastinal adenopathy   . Smoker    former  . Wears dentures   . Wears dentures   . Wears glasses   . Wears glasses     Past Surgical History:  Procedure Laterality Date  . BRONCHIAL BIOPSY  08/20/2019   Procedure: BRONCHIAL BIOPSIES;  Surgeon: Collene Gobble, MD;  Location: [facility] ENDOSCOPY;  Service: Pulmonary;;  . BRONCHIAL BRUSHINGS  08/20/2019   Procedure: BRONCHIAL BRUSHINGS;  Surgeon: Collene Gobble, MD;  Location: [facility] ENDOSCOPY;  Service: Pulmonary;;  . BRONCHIAL NEEDLE ASPIRATION BIOPSY  08/20/2019   Procedure: BRONCHIAL NEEDLE ASPIRATION BIOPSIES;  Surgeon: Collene Gobble, MD;  Location: [facility] ENDOSCOPY;  Service: Pulmonary;;  . CARPAL TUNNEL RELEASE Right 01/09/2019   Procedure: CARPAL TUNNEL RELEASE;  Surgeon: Leandrew Koyanagi, MD;  Location: [place];  Service: Orthopedics;  Laterality: Right;  . CATARACT EXTRACTION Right 2018  . COLONOSCOPY  2007   Dr. Benson Norway  . DIRECT LARYNGOSCOPY N/A 04/10/2020   Procedure: DIRECT LARYNGOSCOPY;  Surgeon: Melida Quitter, MD;  Location: [place];  Service: ENT;  Laterality: N/A;  . I & D EXTREMITY Right 04/02/2016    Procedure: IRRIGATION AND DEBRIDEMENT GREAT TOE;  Surgeon: Leandrew Koyanagi, MD;  Location: [place];  Service: Orthopedics;  Laterality: Right;  . IR IMAGING GUIDED PORT INSERTION  08/30/2019  . MULTIPLE TOOTH EXTRACTIONS    . RADIOACTIVE SEED IMPLANT  2003  . TRACHEOSTOMY TUBE PLACEMENT N/A 04/10/2020   Procedure: TRACHEOSTOMY;  Surgeon: Melida Quitter, MD;  Location: [place];  Service: ENT;  Laterality: N/A;  . ULNAR TUNNEL RELEASE Right 01/09/2019   Procedure: RIGHT CUBITAL TUNNEL RELEASE AND CARPAL TUNNEL RELEASE;  Surgeon: Leandrew Koyanagi, MD;  Location: [place];  Service: Orthopedics;  Laterality: Right;  . UPPER GASTROINTESTINAL ENDOSCOPY    . VIDEO BRONCHOSCOPY N/A 12/18/2019   Procedure: VIDEO BRONCHOSCOPY WITHOUT FLUORO;  Surgeon: Collene Gobble, MD;  Location: [facility] ENDOSCOPY;  Service: Cardiopulmonary;  Laterality: N/A;  . VIDEO BRONCHOSCOPY WITH ENDOBRONCHIAL ULTRASOUND N/A 08/20/2019   Procedure: VIDEO BRONCHOSCOPY WITH ENDOBRONCHIAL ULTRASOUND;  Surgeon: Collene Gobble, MD;  Location: [facility] ENDOSCOPY;  Service: Pulmonary;  Laterality: N/A;    Social History:   reports that he quit smoking about 32 years ago. He started smoking about 65 years ago. He has a 32.00 pack-year smoking history. He has never used smokeless tobacco. He reports that he does not drink alcohol and does not use drugs.  No Known Allergies  Family History  Problem Relation Age of Onset  . Heart failure Mother   . Diabetes Mother   . Stroke Father   . Diabetes Father   . Diabetes Sister   . Diabetes Sister   . Lung cancer Sister 46  . Colon cancer Neg Hx   . Esophageal cancer Neg Hx   . Rectal cancer Neg Hx   . Colon polyps Neg Hx   . Stomach cancer Neg Hx      Prior to Admission medications   Medication Sig Start Date End Date Taking? Authorizing Provider  amLODipine (NORVASC) 5 MG tablet Take 1 tablet (5 mg total) by mouth daily. 04/07/20   [name], MD  aspirin EC 81 MG tablet Take 81  mg by mouth daily after breakfast.     [provider]  Blood Glucose Monitoring Suppl (ONETOUCH VERIO) w/Device KIT USE TO CHECK SUGAR DAILY 02/21/17   [name], MD  cholecalciferol (VITAMIN D) 1000 units tablet Take 1,000 Units by mouth daily after breakfast.     [provider]  dronabinol (MARINOL) 2.5 MG capsule TAKE 1 CAPSULE BY MOUTH 2 TIMES DAILY BEFORE A MEAL. Patient not taking: No sig reported 12/26/19   [name], MD  ferrous sulfate 325 (65 FE) MG EC tablet Take 1 tablet (325 mg total) by mouth daily with breakfast. 04/07/20   [name], MD  gabapentin (NEURONTIN) 100 MG capsule Take 1 capsule (100 mg total) by mouth 3 (three) times daily. Patient not taking: No sig reported 12/03/19   Curt Bears, MD  guaiFENesin-codeine 100-10 MG/5ML syrup TAKE 5ML BY MOUTH 3 TIMES A  DAY AS NEEDED FOR COUGH 10/21/19   Tanner, Lyndon Code., PA-C  HYDROcodone-acetaminophen (NORCO/VICODIN) 5-325 MG tablet Take 1-2 tablets by mouth every 6 (six) hours as needed for moderate pain. 04/15/20   Melida Quitter, MD  Lancets (ONETOUCH DELICA PLUS [id number]) [place] PATIENT IS TO TEST TWO TIMES A DAY DX: E11.9 (ONETOUCH VERIO FLEX) 04/23/19   [name], MD  lidocaine-prilocaine (EMLA) cream Apply to the Port-A-Cath site 30-60-minute before chemotherapy. Patient not taking: No sig reported 08/24/19   Curt Bears, MD  lisinopril-hydrochlorothiazide (ZESTORETIC) 20-12.5 MG tablet Take 1 tablet by mouth daily. 04/07/20   [name], MD  metFORMIN (GLUCOPHAGE-XR) 500 MG 24 hr tablet Take 1 tablet (500 mg total) by mouth daily. 04/07/20   [name], MD  mirtazapine (REMERON) 15 MG tablet TAKE 1 TABLET BY MOUTH EVERYDAY AT BEDTIME Patient not taking: Reported on 04/01/2020 01/01/20   Heilingoetter, Cassandra L, PA-C  Multiple Vitamin (MULTIVITAMIN WITH MINERALS) TABS tablet Take 1 tablet by mouth daily after breakfast.     [provider]  [facility] VERIO test strip USE  AS INSTRUCTED TO CHECK TWICE DAILY 10/07/19   [name], MD  simvastatin (ZOCOR) 40 MG tablet Take 1 tablet (40 mg total) by mouth daily. 04/07/20   [name], MD    Physical Exam: Vitals:   04/19/20 2341 04/19/20 2345 04/20/20 0000 04/20/20 0008  BP: (!) 117/51 (!) 143/52 (!) 104/46   Pulse: 99 91 74   Resp: (!) 24 (!) 25 (!) 24   Temp:    98.9 F (37.2 C)  TempSrc:    Oral  SpO2: 97% 98% 98%   Weight:      Height:        Constitutional: NAD, appears uncomfortable   Eyes: PERTLA, lids and conjunctivae normal ENMT: Mucous membranes are moist. Posterior pharynx clear of any exudate or lesions.   Neck: supple, no masses, tracheostomy  Respiratory: Scattered rhonchi, mild tachypnea, no wheezing. No pallor or cyanosis.     Cardiovascular: S1 & S2 heard, regular rate and rhythm. No extremity edema.   Abdomen: No distension, no tenderness, soft. Bowel sounds active.  Musculoskeletal: no clubbing / cyanosis. No joint deformity upper and lower extremities.   Skin: no significant rashes, lesions, ulcers. Warm, dry, well-perfused. Neurologic: CN 2-12 grossly intact. Sensation intact. Moving all extremities.  Psychiatric: Alert and oriented to person, place, and situation. Calm and cooperative.    Labs and Imaging on Admission: I have personally reviewed following labs and imaging studies  CBC: Recent Labs  Lab 04/19/20 2018  WBC 8.6  NEUTROABS 6.5  HGB 13.7  HCT 40.8  MCV 93.4  PLT 979   Basic Metabolic Panel: Recent Labs  Lab 04/19/20 2018  NA 132*  K 4.7  CL 94*  CO2 26  GLUCOSE 109*  BUN 82*  CREATININE 4.58*  CALCIUM 9.2   GFR: Estimated Creatinine Clearance: 11.8 mL/min (A) (by C-G formula based on SCr of 4.58 mg/dL (H)). Liver Function Tests: Recent Labs  Lab 04/19/20 2018  AST 27  ALT 25  ALKPHOS 60  BILITOT 1.1  PROT 8.0  ALBUMIN 3.1*   No results for input(s): LIPASE, AMYLASE in the last 168 hours. No results for input(s): AMMONIA in the  last 168 hours. Coagulation Profile: Recent Labs  Lab 04/19/20 2018  INR 1.3*   Cardiac Enzymes: No results for input(s): CKTOTAL, CKMB, CKMBINDEX, TROPONINI in the last 168 hours. BNP (last 3 results) No results  for input(s): PROBNP in the last 8760 hours. HbA1C: No results for input(s): HGBA1C in the last 72 hours. CBG: Recent Labs  Lab 04/14/20 1145 04/14/20 1634 04/14/20 2145 04/15/20 0732 04/15/20 1217  GLUCAP 103* 104* 104* 81 107*   Lipid Profile: No results for input(s): CHOL, HDL, LDLCALC, TRIG, CHOLHDL, LDLDIRECT in the last 72 hours. Thyroid Function Tests: No results for input(s): TSH, T4TOTAL, FREET4, T3FREE, THYROIDAB in the last 72 hours. Anemia Panel: No results for input(s): VITAMINB12, FOLATE, FERRITIN, TIBC, IRON, RETICCTPCT in the last 72 hours. Urine analysis:    Component Value Date/Time   COLORURINE YELLOW 10/18/2019 [place] 10/18/2019 1323   LABSPEC 1.008 10/18/2019 1323   LABSPEC 1.010 09/18/2019 1406   PHURINE 8.0 10/18/2019 1323   GLUCOSEU NEGATIVE 10/18/2019 1323   HGBUR NEGATIVE 10/18/2019 1323   BILIRUBINUR NEGATIVE 10/18/2019 1323   BILIRUBINUR negative 09/18/2019 1406   BILIRUBINUR n 06/13/2011 0931   KETONESUR NEGATIVE 10/18/2019 1323   PROTEINUR NEGATIVE 10/18/2019 1323   UROBILINOGEN negative 06/13/2011 0931   NITRITE NEGATIVE 10/18/2019 1323   LEUKOCYTESUR NEGATIVE 10/18/2019 1323   Sepsis Labs: '@LABRCNTIP' (procalcitonin:4,lacticidven:4) ) Recent Results (from the past 240 hour(s))  MRSA PCR Screening     Status: None   Collection Time: 04/10/20  6:51 PM   Specimen: Nasal Mucosa; Nasopharyngeal  Result Value Ref Range Status   MRSA by PCR NEGATIVE NEGATIVE Final    Comment:        The GeneXpert MRSA Assay (FDA approved for NASAL specimens only), is one component of a comprehensive MRSA colonization surveillance program. It is not intended to diagnose MRSA infection nor to guide or monitor treatment  for MRSA infections. Performed at [facility], [place] [address], [place], [place] [address]      Radiological Exams on Admission: DG Chest 2 View  Addendum Date: 04/19/2020   ADDENDUM REPORT: 04/19/2020 20:43 ADDENDUM: These results were called by telephone at the time of interpretation on 04/19/2020 at 8:43 pm to provider Carmin Muskrat , who verbally acknowledged these results. Electronically Signed   By: Zetta Bills M.D.   On: 04/19/2020 20:43   Result Date: 04/19/2020 CLINICAL DATA:  Chest pain, history of asthma and carotid stenosis. Also with history of lung cancer. EXAM: CHEST - 2 VIEW COMPARISON:  March 17, 2020 FINDINGS: RIGHT-sided Port-A-Cath terminates at the caval to atrial junction. Tracheostomy tube in place. This is new compared to previous imaging. Cardiomediastinal contours accentuated by portable technique and AP projection. Low lung volumes. Perihilar and LEFT upper lobe consolidative changes in the LEFT chest following treatment for lung cancer. Accentuation of aortic contour on the current study though portable projection limits assessment. RIGHT lower lobe consolidation. Leads project over the chest bilaterally. IMPRESSION: 1. Perihilar and LEFT upper lobe consolidative changes are similar to prior exam. 2. RIGHT lower lobe consolidative changes also present not as well evaluated as on previous studies. 3. Accentuated aortic contour may be related to increasing adjacent airspace disease due to superimposed pneumonia or increasing confluence of post treatment changes. In the setting of chest pain CT of the chest may be helpful to exclude acute aortic process though this is favored to represent hilar disease and increasing confluence of post treatment changes. A call is out to the referring provider further discuss findings in the above case. Electronically Signed: By: Zetta Bills M.D. On: 04/19/2020 20:31   CT CHEST WO CONTRAST  Result Date: 04/19/2020 CLINICAL DATA:   Stage IV  non-small cell lung cancer left lower lobe, recent tracheostomy, pain at tracheostomy site and within the left chest. EXAM: CT CHEST WITHOUT CONTRAST TECHNIQUE: Multidetector CT imaging of the chest was performed following the standard protocol without IV contrast. COMPARISON:  03/06/2020 FINDINGS: Cardiovascular: Unenhanced imaging of the heart and great vessels demonstrates no pericardial effusion. Normal caliber of the thoracic aorta. Evaluation of the vascular lumen is limited without IV contrast. Extensive atherosclerosis of the aorta and coronary vasculature again noted. Right chest wall port via internal jugular approach tip within the superior vena cava. Mediastinum/Nodes: Tracheostomy tube identified, tip well above carina. No abnormalities at the tracheostomy site on this unenhanced exam. Thyroid and esophagus are grossly normal. There is persistent subcarinal adenopathy, though exact measurements are difficult due to the lack of IV contrast. No new adenopathy is visualized. Lungs/Pleura: Progressive consolidation within the medial aspect of the left lung consistent with post radiation change. The known left lower lobe lung cancer is obscured by the surrounding consolidation, measuring approximately 2.5 x 1.8 cm reference image 74/4. There is no evidence of residual cavitation. Subpleural left upper lobe pulmonary nodule measuring 5 mm image 48/4 unchanged. Left lower lobe subpleural pulmonary nodule measuring 8 mm image 105/4 unchanged. Trace left pleural effusion is again noted, without significant change. New left lower lobe bronchial wall thickening and scattered tree in bud nodular opacities are likely related to underlying inflammation or infection. Minimal dependent right lower lobe atelectasis. Otherwise the right lung is clear. No evidence of pneumothorax. Upper Abdomen: No acute abnormality. Musculoskeletal: No acute or destructive bony lesions. Reconstructed images demonstrate no  additional findings. IMPRESSION: 1. Decreased size of the known left lower lobe lung cancer, consistent with response to therapy. Resolution of the peripheral cavitation seen previously. 2. Stable subcentimeter left upper and left lower lobe pulmonary nodules. 3. Progressive consolidation within the medial aspect of the left lung consistent with post radiation change. 4. Left lower lobe bronchial wall thickening and scattered tree in bud nodular airspace disease, consistent with inflammatory or infectious change. 5. Persistent subcarinal adenopathy. Exact measurements of the enlarged lymph node are limited without IV contrast. 6. Interval tracheostomy, without evidence of complication. 7. Stable trace left pleural effusion. Electronically Signed   By: Randa Ngo M.D.   On: 04/19/2020 22:06    Assessment/Plan   1. Acute kidney injury  - Presents with pain at site of recent tracheostomy, difficulty eating and drinking, and fatigue, and is found to have BUN 82 and SCr 4.58, up from 20 and 1.17 three weeks ago  - Likely prerenal azotemia in setting of hypovolemia and hypotension; has also continued ACE-i and using NSAID  - Given 1.5 liters IVF in ED  - Check UA and FENa, check renal US, continue IVF hydration, renally-dose medications, hold lisinopril and HCTZ, repeat chem panel    2. Stage IV non-small cell lung cancer  - Followed at [facility] and undergoing palliative radiotherapy and chemotherapy    3. Hypotension; hx of HTN  - Patient has hx of HTN, was hypotensive in ED  - Hypotension improving with IVF and likely secondary to hypovolemia  - Hold antihypertensives, continue IVF   4. Type II DM  - A1c was 5.9% in January 2022  - Check CBGs and use low-intensity SSI if needed    5. Glottic stenosis; dysphagia  - Status-post tracheostomy on 04/10/20, no apparent complication on CT in ED, but patient reports difficulty swallowing  - NPO for now, consult SLP,  continue  pain-control, trach care   6. Pneumonia; acute hypoxic respiratory failure  - Patient requiring 5 Lpm in ED, CT suggests possible infection in LLL but no fever or leukocytosis  - Culture blood and sputum, check strep pneumo and legionella antigens, start Rocephin and azithromycin    DVT prophylaxis: sq heparin  Code Status: Full, confirmed with patient   Level of Care: Level of care: Progressive Family Communication: Discussed with wife by phone  Disposition Plan:  Patient is from: Home  Anticipated d/c is to: TBD Anticipated d/c date is: 04/24/20 Patient currently: Pending pain-control, improvement in renal function  Consults called: None  Admission status:  Inpatient    Vianne Bulls, MD [facility]  04/20/2020, 12:46 AM

## 2020-04-21 DIAGNOSIS — N179 Acute kidney failure, unspecified: Secondary | ICD-10-CM | POA: Diagnosis not present

## 2020-04-21 LAB — GLUCOSE, CAPILLARY
Glucose-Capillary: 100 mg/dL — ABNORMAL HIGH (ref 70–99)
Glucose-Capillary: 101 mg/dL — ABNORMAL HIGH (ref 70–99)
Glucose-Capillary: 112 mg/dL — ABNORMAL HIGH (ref 70–99)
Glucose-Capillary: 90 mg/dL (ref 70–99)
Glucose-Capillary: 91 mg/dL (ref 70–99)
Glucose-Capillary: 96 mg/dL (ref 70–99)

## 2020-04-21 LAB — LEGIONELLA PNEUMOPHILA SEROGP 1 UR AG: L. pneumophila Serogp 1 Ur Ag: NEGATIVE

## 2020-04-21 LAB — BASIC METABOLIC PANEL
Anion gap: 8 (ref 5–15)
BUN: 41 mg/dL — ABNORMAL HIGH (ref 8–23)
CO2: 24 mmol/L (ref 22–32)
Calcium: 8.7 mg/dL — ABNORMAL LOW (ref 8.9–10.3)
Chloride: 106 mmol/L (ref 98–111)
Creatinine, Ser: 1.39 mg/dL — ABNORMAL HIGH (ref 0.61–1.24)
GFR, Estimated: 51 mL/min — ABNORMAL LOW (ref >60)
Glucose, Bld: 98 mg/dL (ref 70–99)
Potassium: 4.5 mmol/L (ref 3.5–5.1)
Sodium: 138 mmol/L (ref 135–145)

## 2020-04-21 LAB — PROCALCITONIN: Procalcitonin: 0.14 ng/mL

## 2020-04-21 MED ORDER — SODIUM CHLORIDE 0.9 % IV SOLN: INTRAVENOUS | Status: DC

## 2020-04-21 MED ORDER — ACETAMINOPHEN 325 MG PO TABS
650.0000 mg | ORAL_TABLET | ORAL | Status: DC | PRN
  Administered 2020-04-21 (×3): 650 mg via ORAL
  Filled 2020-04-21 (×3): qty 2

## 2020-04-21 MED ORDER — SENNOSIDES-DOCUSATE SODIUM 8.6-50 MG PO TABS
2.0000 | ORAL_TABLET | Freq: Two times a day (BID) | ORAL | Status: DC
  Administered 2020-04-21 – 2020-04-22 (×3): 2 via ORAL
  Filled 2020-04-21 (×3): qty 2

## 2020-04-21 MED ORDER — POLYETHYLENE GLYCOL 3350 17 G PO PACK: 17.0000 g | PACK | Freq: Every day | ORAL | Status: DC | PRN

## 2020-04-21 NOTE — Progress Notes (Addendum)
PROGRESS NOTE  Shaun Kennedy ZOX:[id number] DOB: [birth year] DOA: 04/19/2020 PCP: [name], MD  HPI/Recap of past 24 hours: Shaun Kennedy is a 80 y.o. 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  Patient was hospitalized recently, found to have glottic stenosis, and had tracheostomy on 04/10/2020, returned home on 04/15/2020, and has had difficulty with pain at the tracheostomy that has been uncontrolled despite hydrocodone, difficulty eating or drinking, and family has been concerned that they may not be providing optimal care of the tracheostomy.  Patient has been trying to eat some applesauce very slowly, but has pain with this and requires a lot of encouragement from family to try to consume just a few bites of applesauce.  He has been taking his medications as prescribed.  Family has called EMS out to the house 4 times since the recent discharge for help.  Patient has grown progressively fatigued.  He has had some left rib pain that has been coming and going over the past month or so.  Has not noticed any fever or chills.  ED Course: Upon arrival to the ED, patient is found to be afebrile, saturating upper 90s on 5 L/min of supplemental oxygen, mildly tachypneic, and with blood pressure 83/47.  Chemistry panel notable for BUN of 82 and creatinine 4.58, baseline creatinine 1.1.  CT chest demonstrates decreased size of known left lower lobe cancer, stable subcentimeter nodules, post radiation change involving the medial aspect of the left lung, left lower lobe bronchial wall thickening and airspace disease concerning for inflammatory or infectious change, persistent subcarinal adenopathy, and interval tracheostomy  without evidence for complication.   Sputum culture has been ordered for analysis.  He is currently on Rocephin and azithromycin empirically for community-acquired pneumonia.  Reports difficulty swallowing.  No evidence of oral thrush on exam. Imaging unremarkable for complication frm recent procedure done by ENT.  04/21/20: Seen and examined at bedside.  Reported confusion overnight.  Likely secondary to use of opiates.  This morning he is alert and interactive.  He is requesting a diet with more consistency.  Assessment/Plan: Principal Problem:   Acute renal failure (ARF) (HCC) Active Problems:   Diabetes mellitus type II, non insulin dependent (HCC)   Malignant neoplasm of lower lobe of left lung (HCC)   Hypotension due to hypovolemia   Left lower lobe pneumonia   Pressure injury of skin  Severe dysphagia in the setting of glottic stenosis s/p tracheostomy by ENT Dr. Redmond Baseman on 04/10/2020 Seen by speech therapist, post MBS on 04/20/2020, possible edema around pharynx. Seen by ENT Dr. Redmond Baseman, okay to start a diet.  Initially on clear liquid diet from 04/20/2020, advancing diet as tolerated. Currently on dysphagia 3 diet. Continue aspiration precaution and supportive care.  Acute urinary retention Indwelling Foley catheter placed since 04/20/2020. May need to follow-up with urology outpatient for voiding trial in the clinic.  Resolving nonoliguric acute kidney injury  likely multifactorial, prerenal in the setting of dehydration, poor oral intake, NSAIDs and ACE inhibitor, post renal too with acute urinary retention. - Presents with pain at site of recent tracheostomy, difficulty eating and drinking, and fatigue, and is found to have BUN 82 and SCr 4.58, up from 20 and 1.17 three weeks ago  - Likely  prerenal azotemia in setting of hypovolemia and hypotension; has also continued ACE-i and using NSAID  - Given 1.5 liters IVF in ED  - Continue to hold off home lisinopril and HCTZ. Acute urinary  retention, Foley catheter in place since 04/20/2020. Continue gentle IV fluid hydration Creatinine downtrending 1.3 with GFR 51. Continue to avoid nephrotoxic agents and dehydration. 3.8 L urine output recorded in the last 24 hours.  Resolved acute metabolic encephalopathy likely secondary to opiate use Avoid strong opiates More alert and interactive this morning.  Resolved hypovolemic hyponatremia He is currently on normal saline at 50 cc/h. Serum sodium 138 from 132.  Community-acquired pneumonia, POA CT suggestive of possible infection left lower lobe He is currently on Rocephin and azithromycin, continue Follow sputum culture  Acute hypoxic respiratory failure suspect secondary to pneumonia, radiation pneumonitis, POA - Patient requiring 5 Lpm in ED CT suggests possible infection in LLL, progressive consolidation within the medial aspect of the left lung consistent with post radiation change Wean off oxygen supplementation as tolerated.    Stage IV non-small cell lung cancer  - Followed at [facility] and undergoing palliative radiotherapy and chemotherapy    Resolved hypotension; hx of HTN  - Patient has hx of HTN, was hypotensive in ED  -Responded well to IV fluids. Currently maintaining his MAP greater than 65.  Impaired glucose tolerance. - A1c was 5.9% in January 2022  - Check CBGs and use low-intensity SSI if needed    Glottic stenosis; Status-post tracheostomy on 04/10/20 No apparent complication on CT in ED or MBS.  Stage II pressure ulcer to medial buttocks, POA Local wound care Frequent turns   DVT prophylaxis: sq heparin  3 times daily. Code Status: Full, confirmed with patient   Level of Care: Level of care: Progressive Family Communication:  Updated his wife via phone on 04/21/2020. Disposition Plan:  Patient is from: Home  Anticipated d/c is to:  To home with home health services.      Consultants:  ENT, Dr.  Redmond Baseman.  Procedures:  MBS on 04/20/2020  Antimicrobials:  Rocephin  Azithromycin.    Status is: Inpatient    Dispo:  Patient From: Home  Planned Disposition: Home with Health Care Svc  Anticipated discharge date: 04/22/2020, or when ENT signs off.  Medically stable for discharge: No, ongoing management of pneumonia and dysphagia.          Objective: Vitals:   04/21/20 0800 04/21/20 0824 04/21/20 1051 04/21/20 1154  BP: (!) 127/57 (!) 127/57 139/62   Pulse: 67 69 62 89  Resp: 15 14 18 18   Temp: 98.5 F (36.9 C)  (!) 97.4 F (36.3 C)   TempSrc: Oral  Oral   SpO2: 98% 96% 96% 97%  Weight:      Height:        Intake/Output Summary (Last 24 hours) at 04/21/2020 1355 Last data filed at 04/21/2020 0650 Gross per 24 hour  Intake 1702.84 ml  Output 3800 ml  Net -2097.16 ml   Filed Weights   04/19/20 1943 04/20/20 0119  Weight: 77.1 kg 75.1 kg    Exam:  . General: 79 y.o. year-old male chronically ill-appearing no acute distress.  He is alert and interactive. . Cardiovascular: Regular rate and rhythm no rubs or gallops.  Respiratory: Mild rales at bases.  No wheeze noted.  Good respiratory effort.  Trach collar in place.   . Abdomen: Soft nontender normal bowel sounds present. . Musculoskeletal: No extremity edema bilaterally. Marland Kitchen  Skin: No ulcerative lesions noted. Marland Kitchen Psychiatry: Mood is appropriate for condition and setting.   Data Reviewed: CBC: Recent Labs  Lab 04/19/20 2018  WBC 8.6  NEUTROABS 6.5  HGB 13.7  HCT 40.8  MCV 93.4  PLT 213   Basic Metabolic Panel: Recent Labs  Lab 04/19/20 2018 04/20/20 0105 04/21/20 0106  NA 132* 134* 138  K 4.7 4.7 4.5  CL 94* 100 106  CO2 26 24 24   GLUCOSE 109* 96 98  BUN 82* 81* 41*  CREATININE 4.58* 3.75* 1.39*  CALCIUM 9.2 8.5* 8.7*  MG  --  2.5*  --   PHOS  --  6.1*  --    GFR: Estimated Creatinine Clearance: 39 mL/min (A) (by C-G formula based on SCr of 1.39 mg/dL (H)). Liver Function  Tests: Recent Labs  Lab 04/19/20 2018  AST 27  ALT 25  ALKPHOS 60  BILITOT 1.1  PROT 8.0  ALBUMIN 3.1*   No results for input(s): LIPASE, AMYLASE in the last 168 hours. No results for input(s): AMMONIA in the last 168 hours. Coagulation Profile: Recent Labs  Lab 04/19/20 2018  INR 1.3*   Cardiac Enzymes: No results for input(s): CKTOTAL, CKMB, CKMBINDEX, TROPONINI in the last 168 hours. BNP (last 3 results) No results for input(s): PROBNP in the last 8760 hours. HbA1C: No results for input(s): HGBA1C in the last 72 hours. CBG: Recent Labs  Lab 04/20/20 2002 04/20/20 2358 04/21/20 0440 04/21/20 0758 04/21/20 1050  GLUCAP 110* 91 90 96 100*   Lipid Profile: No results for input(s): CHOL, HDL, LDLCALC, TRIG, CHOLHDL, LDLDIRECT in the last 72 hours. Thyroid Function Tests: No results for input(s): TSH, T4TOTAL, FREET4, T3FREE, THYROIDAB in the last 72 hours. Anemia Panel: No results for input(s): VITAMINB12, FOLATE, FERRITIN, TIBC, IRON, RETICCTPCT in the last 72 hours. Urine analysis:    Component Value Date/Time   COLORURINE YELLOW 04/20/2020 0255   APPEARANCEUR HAZY (A) 04/20/2020 0255   LABSPEC 1.018 04/20/2020 0255   LABSPEC 1.010 09/18/2019 1406   PHURINE 5.0 04/20/2020 0255   GLUCOSEU NEGATIVE 04/20/2020 0255   HGBUR NEGATIVE 04/20/2020 0255   BILIRUBINUR NEGATIVE 04/20/2020 0255   BILIRUBINUR negative 09/18/2019 1406   BILIRUBINUR n 06/13/2011 0931   KETONESUR 5 (A) 04/20/2020 0255   PROTEINUR NEGATIVE 04/20/2020 0255   UROBILINOGEN negative 06/13/2011 0931   NITRITE NEGATIVE 04/20/2020 0255   LEUKOCYTESUR NEGATIVE 04/20/2020 0255   Sepsis Labs: @LABRCNTIP (procalcitonin:4,lacticidven:4)  ) Recent Results (from the past 240 hour(s))  Culture, blood (Routine x 2)     Status: None (Preliminary result)   Collection Time: 04/19/20  8:18 PM   Specimen: BLOOD  Result Value Ref Range Status   Specimen Description BLOOD RIGHT ARM  Final   Special  Requests   Final    BOTTLES DRAWN AEROBIC AND ANAEROBIC Blood Culture results may not be optimal due to an excessive volume of blood received in culture bottles   Culture   Final    NO GROWTH 2 DAYS Performed at [facility], [place] [address], [place], [place] [address]    Report Status PENDING  Incomplete  SARS CORONAVIRUS 2 (TAT 6-24 HRS) Nasopharyngeal Nasopharyngeal Swab     Status: None   Collection Time: 04/20/20 12:12 AM   Specimen: Nasopharyngeal Swab  Result Value Ref Range Status   SARS Coronavirus 2 NEGATIVE NEGATIVE Final    Comment: (NOTE) SARS-CoV-2 target nucleic acids are NOT DETECTED.  The SARS-CoV-2 RNA is generally detectable in upper  and lower respiratory specimens during the acute phase of infection. Negative results do not preclude SARS-CoV-2 infection, do not rule out co-infections with other pathogens, and should not be used as the sole basis for treatment or other patient management decisions. Negative results must be combined with clinical observations, patient history, and epidemiological information. The expected result is Negative.  Fact Sheet for Patients: SugarRoll.be  Fact Sheet for Healthcare Providers: https://www.woods-mathews.com/  This test is not yet approved or cleared by the [place] FDA and  has been authorized for detection and/or diagnosis of SARS-CoV-2 by FDA under an Emergency Use Authorization (EUA). This EUA will remain  in effect (meaning this test can be used) for the duration of the COVID-19 declaration under Se ction 564(b)(1) of the Act, 21 U.S.C. section 360bbb-3(b)(1), unless the authorization is terminated or revoked sooner.  Performed at [facility], [place] [address] [place], [place] [address]   MRSA PCR Screening     Status: None   Collection Time: 04/20/20  1:03 AM   Specimen: Nasopharyngeal  Result Value Ref Range Status   MRSA by PCR NEGATIVE NEGATIVE Final     Comment:        The GeneXpert MRSA Assay (FDA approved for NASAL specimens only), is one component of a comprehensive MRSA colonization surveillance program. It is not intended to diagnose MRSA infection nor to guide or monitor treatment for MRSA infections. Performed at [facility], [place] [address] [place], [place] [address]   Culture, blood (Routine x 2)     Status: None (Preliminary result)   Collection Time: 04/20/20  1:05 AM   Specimen: BLOOD  Result Value Ref Range Status   Specimen Description BLOOD LEFT ARM  Final   Special Requests   Final    BOTTLES DRAWN AEROBIC ONLY Blood Culture results may not be optimal due to an inadequate volume of blood received in culture bottles   Culture   Final    NO GROWTH 1 DAY Performed at [facility], [place] [address] [place], [place] [address]    Report Status PENDING  Incomplete  Culture, Respiratory w Gram Stain     Status: None (Preliminary result)   Collection Time: 04/21/20 12:20 AM   Specimen: Tracheal Aspirate  Result Value Ref Range Status   Specimen Description TRACHEAL ASPIRATE  Final   Special Requests NONE  Final   Gram Stain   Final    MODERATE WBC PRESENT, PREDOMINANTLY PMN NO ORGANISMS SEEN Performed at [facility], [place] [address] [place], [place] [address]    Culture PENDING  Incomplete   Report Status PENDING  Incomplete      Studies: No results found.  Scheduled Meds: . chlorhexidine  15 mL Mouth Rinse BID  . Chlorhexidine Gluconate Cloth  6 each Topical Daily  . heparin  5,000 Units Subcutaneous Q8H  . insulin aspart  0-6 Units Subcutaneous Q4H  . lidocaine  1 application Urethral Once  . mouth rinse  15 mL Mouth Rinse q12n4p  . senna-docusate  1 tablet Oral BID    Continuous Infusions: . sodium chloride 50 mL/hr at 04/21/20 0625  . azithromycin 500 mg (04/21/20 0204)  . cefTRIAXone (ROCEPHIN)  IV 2 g (04/21/20 0200)     LOS: 2 days     Kayleen Memos, MD [facility] Pager [phone]  If 7PM-7AM, please contact night-coverage [email or web address] Password [email or web address] 04/21/2020, 1:55 PM

## 2020-04-21 NOTE — Progress Notes (Signed)
Patient started getting confused at around 2030. Patient disoriented to situation and wanted to talk to his wife. This RN called his wife and the patient talked to his wife. Wife informed the RN that he is not at his baseline and was concerned for him. Daughter called the patient and she too was concerned. Patient following command and asking repeatedly "who bought him to the hospital?" This RN paged the MD. MD at bed side. Refer to his notes. Received verbal order for sputum culture and to avoid opiate as possible.

## 2020-04-21 NOTE — Progress Notes (Signed)
Subjective: Taking a soft diet well.  He has not complained of pain today.  Objective: Vital signs in last 24 hours: Temp:  [97.4 F (36.3 C)-99 F (37.2 C)] 97.4 F (36.3 C) (03/15 1051) Pulse Rate:  [62-89] 89 (03/15 1154) Resp:  [12-20] 18 (03/15 1154) BP: (100-139)/(53-62) 139/62 (03/15 1051) SpO2:  [95 %-98 %] 97 % (03/15 1154) FiO2 (%):  [20 %-28 %] 21 % (03/15 1154) Wt Readings from Last 1 Encounters:  04/20/20 75.1 kg    Intake/Output from previous day: 03/14 0701 - 03/15 0700 In: 1702.8 [P.O.:120; I.V.:1088.6; IV Piggyback:494.3] Out: 3800 [Urine:3800] Intake/Output this shift: No intake/output data recorded.  General appearance: alert, cooperative and somewhat agitated Neck: #6 cuffless flexible Shiley trach in place, mild drainage  Recent Labs    04/19/20 2018  WBC 8.6  HGB 13.7  HCT 40.8  PLT 383    Recent Labs    04/20/20 0105 04/21/20 0106  NA 134* 138  K 4.7 4.5  CL 100 106  CO2 24 24  GLUCOSE 96 98  BUN 81* 41*  CREATININE 3.75* 1.39*  CALCIUM 8.5* 8.7*    Medications: I have reviewed the patient's current medications.  Assessment/Plan: Glottic stenosis s/p tracheostomy  The trach site looks good.  Neck CT personally reviewed and unremarkable.  He is swallowing a soft diet and not complaining of pain.  Call with questions.   LOS: 2 days   Shaun Kennedy 04/21/2020, 11:58 AM

## 2020-04-21 NOTE — Progress Notes (Signed)
RT called to bedside by RN due to patient stating that he " could not breathe " even after RN suctioning . Upon arrival patient sats 96% with PMSV on. Patient RR seemed to be increased and RT checked inner cannula, which had a mucus plug. RT cleansed and dried inner cannula and placed inner cannula back into trach. RT suctioned patient multiple times and got a small about of thick pink tinged/tan secretions including some mucus plugs. After patient said he felt much better and RR and sats stable. RN aware and RT will continue to monitor.

## 2020-04-21 NOTE — Progress Notes (Signed)
  Speech Language Pathology Treatment: Dysphagia  Patient Details Name: Shaun Kennedy MRN: [id number] DOB: 05-27-1940 Today's Date: 04/21/2020 Time: 1000-1040 SLP Time Calculation (min) (ACUTE ONLY): 40 min  Assessment / Plan / Recommendation Clinical Impression  Pt alert and eager to eat and drink. Denies any pain this am and is wondering why hes not getting meals. SLP observed pt with a full cup of water, cup of puree and a graham cracker. All consumed without impairment, consistent with prior eval last admission. Recommend pt upgrade to mechanical soft diet. No SLP f/u needed for swallowing. Pt should continue use of PMSV during all waking hours and during PO intake.   HPI        SLP Plan  All goals met       Recommendations  Diet recommendations: Regular;Thin liquid Liquids provided via: Cup;Straw Medication Administration: Whole meds with liquid Supervision: Patient able to self feed      Patient may use Passy-Muir Speech Valve: During all waking hours (remove during sleep);During PO intake/meals         Follow up Recommendations: None Plan: All goals met       GO               Herbie Baltimore, MA CCC-SLP  Acute Rehabilitation Services Pager [phone] Office [phone]  Shaun Kennedy 04/21/2020, 10:44 AM

## 2020-04-22 ENCOUNTER — Telehealth: Payer: Self-pay | Admitting: Physician Assistant

## 2020-04-22 ENCOUNTER — Inpatient Hospital Stay: Payer: Medicare HMO | Admitting: Physician Assistant

## 2020-04-22 ENCOUNTER — Inpatient Hospital Stay: Payer: Medicare HMO

## 2020-04-22 DIAGNOSIS — N179 Acute kidney failure, unspecified: Secondary | ICD-10-CM | POA: Diagnosis not present

## 2020-04-22 LAB — GLUCOSE, CAPILLARY
Glucose-Capillary: 104 mg/dL — ABNORMAL HIGH (ref 70–99)
Glucose-Capillary: 86 mg/dL (ref 70–99)
Glucose-Capillary: 87 mg/dL (ref 70–99)
Glucose-Capillary: 88 mg/dL (ref 70–99)
Glucose-Capillary: 92 mg/dL (ref 70–99)
Glucose-Capillary: 99 mg/dL (ref 70–99)

## 2020-04-22 LAB — CBC
HCT: 30.4 % — ABNORMAL LOW (ref 39.0–52.0)
Hemoglobin: 10.1 g/dL — ABNORMAL LOW (ref 13.0–17.0)
MCH: 30.7 pg (ref 26.0–34.0)
MCHC: 33.2 g/dL (ref 30.0–36.0)
MCV: 92.4 fL (ref 80.0–100.0)
Platelets: 482 10*3/uL — ABNORMAL HIGH (ref 150–400)
RBC: 3.29 MIL/uL — ABNORMAL LOW (ref 4.22–5.81)
RDW: 12.2 % (ref 11.5–15.5)
WBC: 9 10*3/uL (ref 4.0–10.5)
nRBC: 0 % (ref 0.0–0.2)

## 2020-04-22 LAB — BASIC METABOLIC PANEL
Anion gap: 11 (ref 5–15)
BUN: 16 mg/dL (ref 8–23)
CO2: 24 mmol/L (ref 22–32)
Calcium: 9.1 mg/dL (ref 8.9–10.3)
Chloride: 104 mmol/L (ref 98–111)
Creatinine, Ser: 1.02 mg/dL (ref 0.61–1.24)
GFR, Estimated: >60 mL/min (ref >60)
Glucose, Bld: 100 mg/dL — ABNORMAL HIGH (ref 70–99)
Potassium: 4.6 mmol/L (ref 3.5–5.1)
Sodium: 139 mmol/L (ref 135–145)

## 2020-04-22 MED ORDER — AMOXICILLIN-POT CLAVULANATE 875-125 MG PO TABS
1.0000 | ORAL_TABLET | Freq: Two times a day (BID) | ORAL | Status: DC
  Administered 2020-04-22 – 2020-04-23 (×2): 1 via ORAL
  Filled 2020-04-22 (×2): qty 1

## 2020-04-22 MED ORDER — DARIFENACIN HYDROBROMIDE ER 7.5 MG PO TB24
7.5000 mg | ORAL_TABLET | Freq: Every day | ORAL | Status: DC
  Administered 2020-04-22 – 2020-04-23 (×2): 7.5 mg via ORAL
  Filled 2020-04-22 (×2): qty 1

## 2020-04-22 MED ORDER — AMLODIPINE BESYLATE 5 MG PO TABS
5.0000 mg | ORAL_TABLET | Freq: Every day | ORAL | Status: DC
  Administered 2020-04-22 – 2020-04-23 (×2): 5 mg via ORAL
  Filled 2020-04-22 (×2): qty 1

## 2020-04-22 MED ORDER — UMECLIDINIUM-VILANTEROL 62.5-25 MCG/INH IN AEPB
1.0000 | INHALATION_SPRAY | Freq: Every day | RESPIRATORY_TRACT | Status: DC
  Administered 2020-04-23: 1 via RESPIRATORY_TRACT
  Filled 2020-04-22: qty 14

## 2020-04-22 MED ORDER — METFORMIN HCL ER 500 MG PO TB24
500.0000 mg | ORAL_TABLET | Freq: Every day | ORAL | Status: DC
Start: 2020-04-22 — End: 2020-04-23
  Administered 2020-04-22: 500 mg via ORAL
  Filled 2020-04-22 (×2): qty 1

## 2020-04-22 NOTE — Telephone Encounter (Signed)
Scheduled per 03/15 scheduled message, spoke with patient's wife and he will be notified of new rescheduled appointments.

## 2020-04-22 NOTE — Care Management Important Message (Signed)
Important Message  Patient Details  Name: Shaun Kennedy MRN: [id number] Date of Birth: 02-02-1941   Medicare Important Message Given:  Yes     Orbie Pyo 04/22/2020, 3:07 PM

## 2020-04-22 NOTE — Consult Note (Signed)
[facility] CM Inpatient Consult   04/22/2020  CONNIE BISON 1940/03/31 [id number]    [facility] [[facility]]  Accountable Care Organization [ACO] Patient: Shaun Kennedy HMO  Primary Care Provider: Ronnald Nian, MD, [facility] Medicine  Received a referral request from inpatient [facility] RNCM, Deb, to assist with getting possible nursing such as [facility] to follow up on patient for eyes on assistance for this patient  with less than 30 day old tracheostomy.  Patient to have HHPT/OT/ST at transition to home with home health. Electronic medical record reviewed for needs.  Patient is less than 7 days readmission. This Clinical research associate reached out to [facility] to check if the patient meets criteria for their services. [facility] called and  spoke with Arline Asp, who states they do not currently work with new trach.   Informed inpatient TOC RNCM, Deb, regarding nursing from [facility]. However, patient's family needs maximum education prior to transitioning home when medically ready with home health PT/OT/ST.   Plan:  Will follow for disposition and will refer to [facility] Telephonic RN Care Management Coordinator for follow up support post hospital needs.   For questions, please contact:  Charlesetta Shanks, RN BSN CCM [facility]  [phone] business mobile phone Toll free office [phone]  Fax number: [phone] [email or web address]

## 2020-04-22 NOTE — Evaluation (Signed)
Physical Therapy Evaluation Patient Details Name: Shaun Kennedy MRN: [id number] DOB: 05-02-1940 Today's Date: 04/22/2020   History of Present Illness  80 y.o. male with medical history significant for hypertension, type 2 diabetes mellitus, stage IV non-small cell lung cancer, and glottic stenosis status post elective tracheostomy on 04/10/2020. DC home 3/9 with readmission 3/13 with complaints of pain around the tracheostomy, difficulty eating and drinking, fatigue, and general malaise. Per chart review EMS has been called to the house 4x since DC.  Clinical Impression  Pt admitted with/for pain around trach site leading to difficulty eating and drinking, fatigue and general malaise.  Pt currently limited functionally due to the problems listed below.  (see problems list.)  Pt will benefit from PT to maximize function and safety to be able to get home safely with available assist .     Follow Up Recommendations Home health PT;Supervision - Intermittent    Equipment Recommendations  None recommended by PT    Recommendations for Other Services       Precautions / Restrictions Precautions Precautions: Fall Precaution Comments: trach; PMSV      Mobility  Bed Mobility Overal bed mobility: Needs Assistance Bed Mobility: Supine to Sit     Supine to sit: Supervision          Transfers Overall transfer level: Needs assistance Equipment used: Rolling walker (2 wheeled) Transfers: Sit to/from Stand Sit to Stand: Supervision Stand pivot transfers: Min guard          Ambulation/Gait Ambulation/Gait assistance: Min guard Gait Distance (Feet): 300 Feet Assistive device: IV Pole;None Gait Pattern/deviations: Step-through pattern   Gait velocity interpretation: 1.31 - 2.62 ft/sec, indicative of limited community ambulator General Gait Details: generally steady, 1-2 minor deviations with scanning or directional changes, but no overt LOB  Stairs            Wheelchair  Mobility    Modified Rankin (Stroke Patients Only)       Balance Overall balance assessment: Needs assistance Sitting-balance support: No upper extremity supported;Feet supported Sitting balance-Leahy Scale: Fair       Standing balance-Leahy Scale: Fair Standing balance comment: Able to release RW and use hand for pericare without LOB                             Pertinent Vitals/Pain Pain Assessment: Faces Pain Score: 6  Faces Pain Scale: Hurts little more Pain Location: throat Pain Descriptors / Indicators: Discomfort Pain Intervention(s): Monitored during session    Home Living Family/patient expects to be discharged to:: Private residence Living Arrangements: Spouse/significant other Available Help at Discharge: Family;Available 24 hours/day Type of Home: House Home Access: Stairs to enter Entrance Stairs-Rails: Right;Left;Can reach both Entrance Stairs-Number of Steps: 5 Home Layout: One level Home Equipment: Walker - 2 wheels;Cane - single point;Tub bench      Prior Function Level of Independence: Independent         Comments: family has been helping since surgery; was playing golf 3x/wk prior to surgery     Hand Dominance   Dominant Hand: Left    Extremity/Trunk Assessment   Upper Extremity Assessment Upper Extremity Assessment: Overall WFL for tasks assessed (general weakness, but functional)    Lower Extremity Assessment Lower Extremity Assessment: Overall WFL for tasks assessed (general weakness, but functional)    Cervical / Trunk Assessment Cervical / Trunk Assessment: Normal  Communication   Communication: Passy-Muir valve;Tracheostomy  Cognition Arousal/Alertness: Awake/alert Behavior During  Therapy: WFL for tasks assessed/performed Overall Cognitive Status: Within Functional Limits for tasks assessed                                        General Comments General comments (skin integrity, edema, etc.):  sats low to mid 90's on RA with PMV on.  HR in the 80's and low 90's with gait.    Exercises Other Exercises Other Exercises: encouraged chair level general U/Le AROM   Assessment/Plan    PT Assessment Patient needs continued PT services  PT Problem List Decreased activity tolerance;Decreased mobility;Decreased strength       PT Treatment Interventions Gait training;Stair training;Functional mobility training;Therapeutic activities;Patient/family education    PT Goals (Current goals can be found in the Care Plan section)  Acute Rehab PT Goals Patient Stated Goal: to play golf again PT Goal Formulation: With patient Time For Goal Achievement: 04/29/20 Potential to Achieve Goals: Good    Frequency Min 3X/week   Barriers to discharge        Co-evaluation               AM-PAC PT "6 Clicks" Mobility  Outcome Measure Help needed turning from your back to your side while in a flat bed without using bedrails?: A Little Help needed moving from lying on your back to sitting on the side of a flat bed without using bedrails?: A Little Help needed moving to and from a bed to a chair (including a wheelchair)?: A Little Help needed standing up from a chair using your arms (e.g., wheelchair or bedside chair)?: A Little Help needed to walk in hospital room?: A Little Help needed climbing 3-5 steps with a railing? : A Little 6 Click Score: 18    End of Session   Activity Tolerance: Patient tolerated treatment well Patient left: in chair;with call bell/phone within reach Nurse Communication: Mobility status PT Visit Diagnosis: Unsteadiness on feet (R26.81);Muscle weakness (generalized) (M62.81)    Time: 1191-4782 PT Time Calculation (min) (ACUTE ONLY): 21 min   Charges:   PT Evaluation $PT Eval Low Complexity: 1 Low          04/22/2020  Jacinto Halim., PT Acute Rehabilitation Services [phone]  (pager) [phone]  (office)  Eliseo Gum Lyndi Holbein 04/22/2020, 5:11  PM

## 2020-04-22 NOTE — Hospital Course (Signed)
80 year old community dwelling black male Stage IV T3 N2 M1 NSCLC + glottic stenosis-trach 04/10/2020 and DC 04/15/2020 DM TY 2 HTN Developed discomfort peritrach, dysphagia-EMS called X 4 to house  In ED found to have azotemia BUN/creatinine 82/4.5 CT chest = decreased size lung cancer However left lower lobe bronchial thickening + airspace disease? Blood culture X2 performed  ENT evaluated 3/15-neck CT personally reviewed by them and informed that can take soft diet and follow-up  Sputum CX negative for infection Azithromycin ceftriaxone DC 3/16 Hospitalization complicated by hypoglycemia and mild metabolic encephalopathy as well as acute urinary retention with placement of Foley catheter 3/14  Data BUN/creatinine on admission 82/4.5-->16/1.0 Procalcitonin 0.3-->0.1 WBC 9.0 Hemoglobin 10.1 Platelet 482

## 2020-04-22 NOTE — Progress Notes (Signed)
Patient's HR dropped down to 30's. Upon arrival to patient's room, patient was vomiting . His emesis was greenish. HR went back up to 90s. Patient said he is having difficulty breathing and sounded very congested. Spo2  96%. This RN called respiratory and paged the MD. Respiratory suctioned the patient.   This RN assessed the patient's abdomen; no distention or tenderness and audible bowel sounds in all 4 quadrants. MD made aware. No new orders received. Will continue to monitor the patient.

## 2020-04-22 NOTE — Progress Notes (Signed)
Occupational Therapy Evaluation Patient Details Name: Shaun Kennedy MRN: [id number] DOB: 09-07-1940 Today's Date: 04/22/2020    History of Present Illness 80 y.o. male with medical history significant for hypertension, type 2 diabetes mellitus, stage IV non-small cell lung cancer, and glottic stenosis status post elective tracheostomy on 04/10/2020. DC home 3/9 with readmission 3/13 with complaints of pain around the tracheostomy, difficulty eating and drinking, fatigue, and general malaise. Per chart review EMS has been called to the house 4x since DC.   Clinical Impression   Pt states "I vomited and got sick so my family called 911". Prior to recent trach, pt was independent with ADL and mobility and was an avid golfer. Pt required cues to use his PMSV during session  - " I keep forgetting I have that". Able to progress to EOB then to [facility] <> recliner with minguard A @ RW level. Overall min A with ADL tasks. VSS on 5LO2;FiO2 25% throughout session. Pt concerned about being constipated however had a large BM during session. Relayed concerns to nsg. Pt states he feels weaker than his baseline and would like to work with [facility] therapy to become more independent and resume activities he enjoys, such as golf. Pt very appreciative.Will follow acutely.     Follow Up Recommendations  Home health OT;Supervision - Intermittent    Equipment Recommendations  None recommended by OT    Recommendations for Other Services       Precautions / Restrictions Precautions Precautions: Fall Precaution Comments: trach; PMSV      Mobility Bed Mobility Overal bed mobility: Needs Assistance Bed Mobility: Supine to Sit     Supine to sit: Supervision          Transfers Overall transfer level: Needs assistance Equipment used: Rolling walker (2 wheeled) Transfers: Sit to/from [facility] Sit to Stand: Min guard Stand pivot transfers: Min guard            Balance Overall balance  assessment: Needs assistance   Sitting balance-Leahy Scale: Fair       Standing balance-Leahy Scale: Fair Standing balance comment: Able to release RW and use hand for pericare without LOB                           ADL either performed or assessed with clinical judgement   ADL Overall ADL's : Needs assistance/impaired Eating/Feeding: Set up   Grooming: Set up   Upper Body Bathing: Set up;Sitting   Lower Body Bathing: Minimal assistance;Sit to/from stand   Upper Body Dressing : Set up;Sitting   Lower Body Dressing: Minimal assistance;Sit to/from stand   Toilet Transfer: Min guard;RW;BSC   Toileting- Water quality scientist and Hygiene: Minimal assistance;Sit to/from stand    Pt would most likely be able to ambulate increased distance however increaed time spent back and forth on [facility]           Vision         Perception     Praxis      Pertinent Vitals/Pain Pain Assessment: 0-10 Pain Score: 6  Pain Location: "butt"; feels constipated Pain Descriptors / Indicators: Discomfort Pain Intervention(s): Limited activity within patient's tolerance     Hand Dominance Left   Extremity/Trunk Assessment Upper Extremity Assessment Upper Extremity Assessment: Generalized weakness   Lower Extremity Assessment Lower Extremity Assessment: Defer to PT evaluation   Cervical / Trunk Assessment Cervical / Trunk Assessment: Normal   Communication Communication Communication: Passy-Muir valve;Tracheostomy  Cognition Arousal/Alertness: Awake/alert Behavior During Therapy: WFL for tasks assessed/performed Overall Cognitive Status: Within Functional Limits for tasks assessed                                     General Comments       Exercises Exercises: Other exercises Other Exercises Other Exercises: encouraged chair level general U/Le AROM   Shoulder Instructions      Home Living Family/patient expects to be discharged to:: Private  residence Living Arrangements: Spouse/significant other Available Help at Discharge: Family;Available 24 hours/day Type of Home: House Home Access: Stairs to enter [employer] of Steps: 5 Entrance Stairs-Rails: Right;Left;Can reach both Home Layout: One level     Bathroom Shower/Tub: Tub/shower unit;Curtain   Bathroom Toilet: Handicapped height Bathroom Accessibility: Yes How Accessible: Accessible via walker Home Equipment: [place] - 2 wheels;Cane - single point;Tub bench          Prior Functioning/Environment Level of Independence: Independent        Comments: family has been helping since surgery; was playing golf 3x/wk prior to surgery        OT Problem List: Decreased strength;Decreased activity tolerance;Impaired balance (sitting and/or standing);Decreased knowledge of use of DME or AE;Decreased safety awareness;Pain      OT Treatment/Interventions: Self-care/ADL training;Therapeutic exercise;Neuromuscular education;Energy conservation;Therapeutic activities;Patient/family education;Balance training    OT Goals(Current goals can be found in the care plan section) Acute Rehab OT Goals Patient Stated Goal: to play golf again OT Goal Formulation: With patient Time For Goal Achievement: 05/06/20 Potential to Achieve Goals: Good  OT Frequency: Min 2X/week   Barriers to D/C:            Co-evaluation              AM-PAC OT "6 Clicks" Daily Activity     Outcome Measure Help from another person eating meals?: A Little Help from another person taking care of personal grooming?: A Little Help from another person toileting, which includes using toliet, bedpan, or urinal?: A Little Help from another person bathing (including washing, rinsing, drying)?: A Little Help from another person to put on and taking off regular upper body clothing?: A Little Help from another person to put on and taking off regular lower body clothing?: A Little 6 Click Score:  18   End of Session Equipment Utilized During Treatment: Gait belt;Rolling walker;Oxygen (5L; 28% FiO2) Nurse Communication: Mobility status;Other (comment) (Pt had large BM)  Activity Tolerance: Patient tolerated treatment well Patient left: in chair;with call bell/phone within reach;with chair alarm set;Other (comment) (Nsg asked to get chair alarm box)  OT Visit Diagnosis: Unsteadiness on feet (R26.81);Muscle weakness (generalized) (M62.81);Pain Pain - part of body:  ("butt")                Time: 3419-6222 OT Time Calculation (min): 39 min Charges:  OT General Charges $OT Visit: 1 Visit OT Evaluation $OT Eval Moderate Complexity: 1 Mod OT Treatments $Self Care/Home Management : 23-37 mins  Maurie Boettcher, OT/L   Acute OT Clinical Specialist [place] Pager [phone] Office [phone]   [facility] 04/22/2020, 2:54 PM

## 2020-04-22 NOTE — Progress Notes (Signed)
PROGRESS NOTE  Shaun Kennedy  UYQ:[id number] DOB: 09-25-1940 DOA: 04/19/2020 PCP: Shaun Lung, MD  Brief Narrative:  80 year old community dwelling black male Stage IV T3 N2 M1 NSCLC + glottic stenosis-trach 04/10/2020 and DC 04/15/2020 DM TY 2 HTN Developed discomfort peritrach, dysphagia-EMS called X 4 to house  In ED found to have azotemia BUN/creatinine 82/4.5 CT chest = decreased size Kennedy cancer However left lower lobe bronchial thickening + airspace disease? Blood culture X2 performed  ENT evaluated 3/15-neck CT personally reviewed by them and informed that can take soft diet and follow-up in the outpatient setting  Sputum CX negative for infection Azithromycin ceftriaxone DC 3/16 Hospitalization complicated by hypoglycemia and mild metabolic encephalopathy as well as acute urinary retention with placement of Foley catheter 3/14  Hospital-Problem based course  Glottic stenosis status post trach 3/4 Currently per SLP/ENT using dysphagia 3 diet Rest of care as per ENT-needs follow-up with Dr. Redmond Baseman for routine care-I will CC him Presumed infection peristomal around trach Azithromycin ceftriaxone DC 3/16--change to Augmentin to complete 7 total days Follow fever curve overnight-if all stable no further antibiotics AKI superimposed on CKD 2-etiology postobstructive + prerenal ACE inhibitor Acute urinary retention status post Foley 3/14 DC saline 3/16 Urine output has picked up since 314 and is now nonoliguric Resume home dose of darifenacin 7.5 Will need outpatient urology follow-up for voiding trial and urodynamic studies-we will place name of urologist in chart Hematuria--likely transient--nurse is aware to [place] and flush and let me know if clots are found Stage IV NSCLC Follow-up outpatient oncology--had appt 3/17 Appt cancelled and rescheduled 3/23 DM TY 2 A1c 5.9 on 1/22 CBG coverage 80s to 90s eating 10% of meals Prior to admission on Metformin 500 daily -we  will resume HTN Continue amlodipine 5 daily  DVT prophylaxis: Heparin Code Status: Full Family Communication: Called and discussed with Shaun Kennedy [phone] on telephone 3/16-I have communicated with her that patient will probably discharge tomorrow and will need outpatient Urology/ENT Disposition:  Status is: Inpatient Remains inpatient appropriate because:Hemodynamically unstable, Ongoing active pain requiring inpatient pain management, Altered mental status and Ongoing diagnostic testing needed not appropriate for outpatient work up Dispo:  Patient From: Home  Planned Disposition: Home with Health Care Svc  Medically stable for discharge: No      Consultants:   Ent  Procedures:   Antimicrobials: azithro/Ceftriaxone--->Augmentin    Subjective:  Awake alert in nad Trach  Has tan secretions No fever no chills no n/v   Objective: Vitals:   04/22/20 1000 04/22/20 1006 04/22/20 1553 04/22/20 1602  BP: (!) 147/67  (!) 153/72 (!) 153/72  Pulse: 85 88 78 83  Resp: 18 15 15  (!) 24  Temp:   97.8 F (36.6 C)   TempSrc:   Oral   SpO2: 94% 95% 100% 100%  Weight:      Height:        Intake/Output Summary (Last 24 hours) at 04/22/2020 1616 Last data filed at 04/22/2020 1519 Gross per 24 hour  Intake 1424.58 ml  Output 1400 ml  Net 24.58 ml   Filed Weights   04/19/20 1943 04/20/20 0119  Weight: 77.1 kg 75.1 kg    Examination:  eomi ncat no ict no pallor 6 cuffless trach in place-PMV in place phonating well cta B abd soft nt nd neurologically intact moving all 4 limbs Psych euthymic  Data Reviewed: personally reviewed   Data BUN/creatinine on admission 82/4.5-->16/1.0 Procalcitonin 0.3-->0.1 WBC 9.0 Hemoglobin 10.1 Platelet 482  Radiology Studies: No results found.   Scheduled Meds: . amLODipine  5 mg Oral Daily  . amoxicillin-clavulanate  1 tablet Oral Q12H  . chlorhexidine  15 mL Mouth Rinse BID  . Chlorhexidine Gluconate Cloth  6 each Topical  Daily  . darifenacin  7.5 mg Oral Daily  . heparin  5,000 Units Subcutaneous Q8H  . insulin aspart  0-6 Units Subcutaneous Q4H  . lidocaine  1 application Urethral Once  . mouth rinse  15 mL Mouth Rinse q12n4p  . metFORMIN  500 mg Oral Q supper  . senna-docusate  2 tablet Oral BID  . umeclidinium-vilanterol  1 puff Inhalation Daily   Continuous Infusions:    LOS: 3 days   Time spent: [place], MD [facility] To contact the attending provider between 7A-7P or the covering provider during after hours 7P-7A, please log into the web site [email or web address] and access using universal [place] password for that web site. If you do not have the password, please call the hospital operator.  04/22/2020, 4:16 PM

## 2020-04-22 NOTE — TOC Progression Note (Addendum)
Transition of Care [facility]) - Progression Note    Patient Details  Name: Shaun Kennedy MRN: [id number] Date of Birth: July 12, 1940  Transition of [facility]) CM/SW Contact  Zenon Mayo, RN Phone Number: 04/22/2020, 9:51 AM  Clinical Narrative:    NCM spoke with patient at bedside, he states to call his wife regarding [facility] services. NCM contacted his wife.  She states she does not have a preference for the agency.  NCM working on setting up [facility] services. NCM contacted  [facility], [place], interim, [place] [facility], [place], [facility], all declined .  NCM contacted wife informed her that was not able to get any [facility] services set up for this patient.  She would not want SNF for him and the trach would have to be 74 days old before he is considered for a SNF.  Wife and family will need lots of education on trach care before he is discharged. They had the same issue on previous admission  With the [facility] agencies.          Expected Discharge Plan and Services                                                 Social Determinants of Health (SDOH) Interventions    Readmission Risk Interventions No flowsheet data found.

## 2020-04-23 LAB — CBC WITH DIFFERENTIAL/PLATELET
Abs Immature Granulocytes: 0.09 10*3/uL — ABNORMAL HIGH (ref 0.00–0.07)
Basophils Absolute: 0 10*3/uL (ref 0.0–0.1)
Basophils Relative: 0 %
Eosinophils Absolute: 0.2 10*3/uL (ref 0.0–0.5)
Eosinophils Relative: 2 %
HCT: 29 % — ABNORMAL LOW (ref 39.0–52.0)
Hemoglobin: 10.1 g/dL — ABNORMAL LOW (ref 13.0–17.0)
Immature Granulocytes: 1 %
Lymphocytes Relative: 18 %
Lymphs Abs: 1.2 10*3/uL (ref 0.7–4.0)
MCH: 31.5 pg (ref 26.0–34.0)
MCHC: 34.8 g/dL (ref 30.0–36.0)
MCV: 90.3 fL (ref 80.0–100.0)
Monocytes Absolute: 0.7 10*3/uL (ref 0.1–1.0)
Monocytes Relative: 12 %
Neutro Abs: 4.2 10*3/uL (ref 1.7–7.7)
Neutrophils Relative %: 67 %
Platelets: 450 10*3/uL — ABNORMAL HIGH (ref 150–400)
RBC: 3.21 MIL/uL — ABNORMAL LOW (ref 4.22–5.81)
RDW: 12.2 % (ref 11.5–15.5)
WBC: 6.4 10*3/uL (ref 4.0–10.5)
nRBC: 0 % (ref 0.0–0.2)

## 2020-04-23 LAB — BASIC METABOLIC PANEL
Anion gap: 10 (ref 5–15)
BUN: 12 mg/dL (ref 8–23)
CO2: 26 mmol/L (ref 22–32)
Calcium: 9 mg/dL (ref 8.9–10.3)
Chloride: 100 mmol/L (ref 98–111)
Creatinine, Ser: 0.93 mg/dL (ref 0.61–1.24)
GFR, Estimated: >60 mL/min (ref >60)
Glucose, Bld: 93 mg/dL (ref 70–99)
Potassium: 4.1 mmol/L (ref 3.5–5.1)
Sodium: 136 mmol/L (ref 135–145)

## 2020-04-23 LAB — GLUCOSE, CAPILLARY
Glucose-Capillary: 72 mg/dL (ref 70–99)
Glucose-Capillary: 83 mg/dL (ref 70–99)
Glucose-Capillary: 84 mg/dL (ref 70–99)
Glucose-Capillary: 92 mg/dL (ref 70–99)

## 2020-04-23 LAB — CULTURE, RESPIRATORY W GRAM STAIN: Culture: NORMAL

## 2020-04-23 MED ORDER — AMOXICILLIN-POT CLAVULANATE 875-125 MG PO TABS: 1.0000 | ORAL_TABLET | Freq: Two times a day (BID) | ORAL | 0 refills | Status: DC

## 2020-04-23 MED ORDER — SENNOSIDES-DOCUSATE SODIUM 8.6-50 MG PO TABS: 2.0000 | ORAL_TABLET | Freq: Two times a day (BID) | ORAL | 0 refills | Status: DC

## 2020-04-23 MED ORDER — POLYETHYLENE GLYCOL 3350 17 G PO PACK: 17.0000 g | PACK | Freq: Every day | ORAL | 0 refills | Status: DC | PRN

## 2020-04-23 NOTE — Progress Notes (Signed)
Was called by RN to do trach education with family since patient going home today. Upon arrival family informed me they had already participated in trach teaching with Arbie Cookey RRT & were comfortable with taking care of trach. Trach care booklet given to them with trach clinic phone number for follow up appointment.

## 2020-04-23 NOTE — Consult Note (Signed)
[facility] Nurse Consult Note: Reason for Consult: Consult requested for stage 2 to buttocks.  This can be handled independently by the staff nurses using the standing skin care order set.  The stage 2 pressure injury was noted as present on admission on the nursing flowsheet. Pressure Injury POA: Yes Dressing procedure/placement/frequency: Foam dressing to protect and promote healing. Please re-consult if further assistance is needed.  Thank-you,  Julien Girt MSN, [place], [place], [place], [place]

## 2020-04-24 LAB — CULTURE, BLOOD (ROUTINE X 2): Culture: NO GROWTH

## 2020-04-25 LAB — CULTURE, BLOOD (ROUTINE X 2): Culture: NO GROWTH

## 2020-04-27 ENCOUNTER — Telehealth: Payer: Self-pay | Admitting: Family Medicine

## 2020-04-27 ENCOUNTER — Other Ambulatory Visit: Payer: Self-pay

## 2020-04-27 ENCOUNTER — Ambulatory Visit (INDEPENDENT_AMBULATORY_CARE_PROVIDER_SITE_OTHER): Payer: Medicare HMO | Admitting: Family Medicine

## 2020-04-27 ENCOUNTER — Telehealth: Payer: Self-pay

## 2020-04-27 ENCOUNTER — Encounter: Payer: Self-pay | Admitting: Family Medicine

## 2020-04-27 VITALS — BP 112/70 | HR 67 | Temp 98.1°F | Wt 164.0 lb

## 2020-04-27 DIAGNOSIS — I152 Hypertension secondary to endocrine disorders: Secondary | ICD-10-CM

## 2020-04-27 DIAGNOSIS — E119 Type 2 diabetes mellitus without complications: Secondary | ICD-10-CM

## 2020-04-27 DIAGNOSIS — L89159 Pressure ulcer of sacral region, unspecified stage: Secondary | ICD-10-CM | POA: Diagnosis not present

## 2020-04-27 DIAGNOSIS — E1159 Type 2 diabetes mellitus with other circulatory complications: Secondary | ICD-10-CM | POA: Diagnosis not present

## 2020-04-27 DIAGNOSIS — J386 Stenosis of larynx: Secondary | ICD-10-CM | POA: Diagnosis not present

## 2020-04-27 DIAGNOSIS — I700 Atherosclerosis of aorta: Secondary | ICD-10-CM

## 2020-04-27 DIAGNOSIS — J309 Allergic rhinitis, unspecified: Secondary | ICD-10-CM

## 2020-04-27 DIAGNOSIS — C3432 Malignant neoplasm of lower lobe, left bronchus or lung: Secondary | ICD-10-CM

## 2020-04-27 DIAGNOSIS — I6522 Occlusion and stenosis of left carotid artery: Secondary | ICD-10-CM

## 2020-04-27 DIAGNOSIS — J189 Pneumonia, unspecified organism: Secondary | ICD-10-CM

## 2020-04-27 LAB — POCT GLYCOSYLATED HEMOGLOBIN (HGB A1C): Hemoglobin A1C: 6.5 % — AB (ref 4.0–5.6)

## 2020-04-27 NOTE — Telephone Encounter (Signed)
Pt wife wanted to know if this could be done over the phone. Shaun Kennedy

## 2020-04-27 NOTE — Telephone Encounter (Signed)
Called to find out what med where d/c at the er visit. LVM for pt or wife to call back. [place]

## 2020-04-27 NOTE — Telephone Encounter (Signed)
Yes

## 2020-04-27 NOTE — Progress Notes (Signed)
   Subjective:    Patient ID: Shaun Kennedy, male    DOB: 24-Feb-1940, 80 y.o.   MRN: [id number]  HPI He is here for an interval evaluation.  He was in the hospital March 13 and sent home on the 17th.  The discharge summary is not in his record.  He did have difficulty with azotemia as well as dysphagia secondary to glottic stenosis.  He also had evidence of pneumonia with respiratory failure.  He apparently also developed a sacral decubitus while in the hospital.  Presently he is doing much better.  He is having no present difficulty with his trach and eating better.  Review of recent blood work did show his renal function returning to normal.  He is scheduled to see Dr. Earlie Server on Wednesday.  Apparently several medications were stopped but the only one they can remember is lisinopril.   Review of Systems     Objective:   Physical Exam Alert and in no distress.  His speech pattern seems to be normal.  He is having no difficulty from his tracheostomy.  Cardiac exam shows regular rhythm without murmurs or gallops.  Lungs show scattered rhonchi.  Exam of the sacral area does per change her password updated [id number] my doing what I have to change what doing if there is a column second get this verification could fix it was this for now for the controlled substance the PMP aware so okay needed for the new password [place] get me 1 what is going on at home that I had to pull a folder of will know every time I show some slight erythema but no induration in the gluteal cleft.  The area is covered with a bandage. Hemoglobin A1c is 6.5 There is x-ray evidence of atherosclerosis     Assessment & Plan:  Hypertension associated with diabetes ([place])  Atherosclerosis of aorta (HCC)  Malignant neoplasm of lower lobe of left lung (HCC)  Glottic stenosis  Type 2 diabetes mellitus without complication, without long-term current use of insulin ([place]) - Plan: POCT glycosylated hemoglobin (Hb  A1C)  Pressure injury of skin of sacral region, unspecified injury stage - Plan: AMB referral to wound care center  Pneumonia of left lower lobe due to infectious organism At this point he seems to be doing quite nicely producing referral for decubitus care as appropriate.  He will follow up with Dr. Earlie Server on Wednesday.  He seems to be eating much better. Continue on Metformin.  May need to stop that in the near future.  They did send a list of medications that were stopped in the emergency room.  I decided to bring him back next week and go over these in detail and have them bring in All the medicines that he is on.

## 2020-04-27 NOTE — Progress Notes (Deleted)
6 

## 2020-04-27 NOTE — Patient Outreach (Addendum)
[facility]) Care Management  04/27/2020  Shaun Kennedy April 14, 1940 [id number]     Transition of Care Referral  Referral Date: 04/24/2020 Referral [facility] Liaison Date of Discharge: 04/23/2020 Facility: [facility] PCP [facility]  Initial Assessment   Successful outreach call placed to patient. Spoke with spouse/caregiver. She is pleased to report how well patient is progressing and doing overall. She denies any acute issues or concerns a present. Patient went for PCP follow up appt earlier this morning. Spouse voices that her son and his girlfriend are staying in the home to assist with care. Patient is independent with most ADLs. Spouse voices that patient has bene able to walk about one block around the neighborhood with no assistance.   Conditions: Patient has PMH that includes but not limited to DM, LLL lung CA(stage 2), HTN, tracheostomy, glottic stenosis, HLD and GERD. He was recently hospitalized from 04/19/20-/317/2022 for atypical chest pain.   Medications Reviewed Today    Reviewed by [name], RN (Registered Nurse) on 04/27/20 at 1245  Med List Status: <None>  Medication Order Taking? Sig Documenting Provider Last Dose Status Informant  acetaminophen (TYLENOL) 500 MG tablet [id number] Yes Take 1,000 mg by mouth every 4 (four) hours as needed. [provider]  Active Self  amLODipine (NORVASC) 5 MG tablet [id number] Yes Take 1 tablet (5 mg total) by mouth daily. [name], MD Taking Active Spouse/Significant Other  amoxicillin-clavulanate (AUGMENTIN) 875-125 MG tablet [id number] Yes Take 1 tablet by mouth every 12 (twelve) hours.  Patient taking differently: Take 1 tablet by mouth every 12 (twelve) hours. Finishes dose 04/27/2020   Nita Sells, MD Taking Active Self  ANORO ELLIPTA 62.5-25 MCG/INH AEPB [id number] No Inhale 1 puff into the lungs daily.  Patient not taking: No  sig reported   [provider] Not Taking Active   aspirin EC 81 MG tablet [id number] Yes Take 81 mg by mouth daily after breakfast.  [provider] Taking Active Spouse/Significant Other  Blood Glucose Monitoring Suppl [facility] VERIO) w/Device Drucie Opitz [id number]  USE TO CHECK SUGAR DAILY [name], MD  Active Spouse/Significant Other  ferrous sulfate 325 (65 FE) MG EC tablet [id number] Yes Take 1 tablet (325 mg total) by mouth daily with breakfast. [name], MD Taking Active Spouse/Significant Other  HYDROcodone-acetaminophen (NORCO/VICODIN) 5-325 MG tablet [id number] No Take 1-2 tablets by mouth every 6 (six) hours as needed for moderate pain.  Patient not taking: No sig reported   Melida Quitter, MD Not Taking Active            Med Note Marcellina Millin Apr 20, 2020  7:43 AM) LF on 04-15-20 # 20 DS 3  Lancets (ONETOUCH DELICA PLUS CBSWHQ75F) [place] [id number] Yes PATIENT IS TO TEST TWO TIMES A DAY DX: E11.9 (ONETOUCH VERIO FLEX) [name], MD Taking Active Spouse/Significant Other  metFORMIN (GLUCOPHAGE-XR) 500 MG 24 hr tablet [id number] Yes Take 1 tablet (500 mg total) by mouth daily. [name], MD Taking Active Spouse/Significant Other  Multiple Vitamin (MULTIVITAMIN WITH MINERALS) TABS tablet [id number] Yes Take 1 tablet by mouth daily after breakfast.  [provider] Taking Active Spouse/Significant Other  polyethylene glycol (MIRALAX / GLYCOLAX) 17 g packet [id number] No Take 17 g by mouth daily as needed for moderate constipation.  Patient not taking: No sig reported   Nita Sells, MD Not Taking Active   senna-docusate (SENOKOT-S) 8.6-50 MG tablet [id number]  No Take 2 tablets by mouth 2 (two) times daily.  Patient not taking: No sig reported   Nita Sells, MD Not Taking Active   sodium chloride flush (NS) 0.9 % injection 10 mL [id number]   Curt Bears, MD  Active   solifenacin (VESICARE) 5 MG tablet [id number] Yes Take  5 mg by mouth daily. [provider] Taking Active           Fall Risk  04/27/2020 10/28/2019 08/14/2019 10/17/2018 10/11/2017  Falls in the past year? 0 0 0 0 No  Number falls in past yr: 0 - - - -  Injury with Fall? 0 - - - -  Risk for fall due to : Medication side effect - - - -  Follow up Falls evaluation completed;Education provided - - - -   Depression screen [facility] 2/9 04/27/2020 10/28/2019 08/14/2019  Decreased Interest 0 0 0  Down, Depressed, Hopeless 0 0 0  PHQ - 2 Score 0 0 0  Some recent data might be hidden    SDOH Screenings   Alcohol Screen: Not on file  Depression (PHQ2-9): Low Risk   . PHQ-2 Score: 0  Financial Resource Strain: Not on file  Food Insecurity: No Food Insecurity  . Worried About Charity fundraiser in the Last Year: Never true  . Ran Out of Food in the Last Year: Never true  Housing: Not on file  Physical Activity: Not on file  Social Connections: Not on file  Stress: Not on file  Tobacco Use: Medium Risk  . Smoking Tobacco Use: Former Smoker  . Smokeless Tobacco Use: Never Used  Transportation Needs: No Transportation Needs  . Lack of Transportation (Medical): No  . Lack of Transportation (Non-Medical): No    Goals Addressed              This Visit's Progress   .  (THN)Make and Keep All Appointments (pt-stated)        Timeframe:  Short-Term Goal Priority:  High Start Date:   04/27/2020                          Expected End Date:  April 2022                     Follow Up Date April 2022   - call to cancel if needed - keep a calendar with appointment dates    Why is this important?    Part of staying healthy is seeing the doctor for follow-up care.   If you forget your appointments, there are some things you can do to stay on track.    Notes:  04/27/2020-Patient had PCP follow up appt today.     .  (THN)Set My Target A1C-Diabetes Type 2 (pt-stated)        Timeframe:  Long-Range Goal Priority:  Medium Start Date: 04/27/2020                             Expected End Date:  June 2022                     Follow Up Date June 2022   - set target A1C    Why is this important?    Your target A1C is decided together by you and your doctor.   It is based on several things like your age and other  health issues.    Notes:  04/27/2020-Patient went for PCP appt today and had A1C level drawn-results pending. Last A1C on file 5.9(Jan 2022).    .  (THN)Track and Manage My Symptoms- (pt-stated)        Timeframe:  Short-Term Goal Priority:  High Start Date:   04/27/2020                          Expected End Date:  April 2022                    Follow Up Date April 2022   - develop a rescue plan - follow rescue plan if symptoms flare-up - keep follow-up appointments  -perform trach care daily as ordered   Why is this important?    Tracking your symptoms and other information about your health helps your doctor plan your care.   Write down the symptoms, the time of day, what you were doing and what medicine you are taking.   You will soon learn how to manage your symptoms.     Notes:  04/27/2020-Spoke with souse and patient's son girlfriend(Angie) whoa re both assisting with managing patient's care and trach. They voice being knowledgeable and comfortable performing trach care in the home. They have contact info for DME company.       Consent: [facility] services reviewed and discussed with spouse. Verbal consent for services given.   Plan:  RN CM discussed with caregiver next outreach within the month of April. Caregiver gave verbal consent and in agreement with RN CM follow up and timeframe. Caregiver aware that they may contact RN CM sooner for any issues or concerns. RN CM reviewed goals and plan of care with caregiver. Caregiver agrees to care plan and follow up. RN CM will send barriers letter and route encounter to PCP. RN CM will send welcome letter to patient.  [name], RN,BSN,CCM [facility]  Management Telephonic Care Management Coordinator Direct Phone: [phone] Toll Free: [phone] Fax: [phone]

## 2020-04-27 NOTE — Telephone Encounter (Signed)
Pts niece called back and gave me a list of medications that pt was taken off of in the hospital  Gabapentin Cholecalciferol Guaifenesin-codeine Lidocaine Lisinopril Lorazepam Naproxen Simavastatin One touch test strips Dronabinol

## 2020-04-27 NOTE — Progress Notes (Signed)
[facility] OFFICE PROGRESS NOTE  Shaun Nian, MD [address] [place] [address]  DIAGNOSIS: Stage IV (T3, N2, M1 a) non-small cell lung cancer, squamous cell carcinoma presented with obstructive left lower lobe lung mass in addition to mediastinal lymphadenopathy as well as bilateral pulmonary nodules diagnosed in July 2021.  PDL1:0%  PRIOR THERAPY: Palliative radiotherapy to the obstructive left lower lobe lung mass under the care of Dr. Mitzi Kennedy.Completed on 09/20/19  CURRENT THERAPY: Systemic chemotherapy 2 cycles of chemotherapy with carboplatin for an AUC of5and paclitaxel175 mg/m2in addition to immunotherapy with nivolumab360 mgevery 3 weeks and ipilimumab1 mg/kgIV every 6 weeksfollowed by maintenance nivolumab and ipilimumab.He started the first treatment on 09/04/2019. He is status postDay1cycle #6of the treatment.  INTERVAL HISTORY: [name] 80 y.o. male returns to the clinic today for a follow up visit. In the interval since his last appointment. The patient had a tracheostomy placed under the care of Dr. Jenne Kennedy due to glottic stenosis and inspiratory stridor. His was performed on 04/10/20 and he was discharged on 04/15/20. On 04/19/20, the patient presented to the ER for pain at tracheostomy site, dysphagia, fatigue, and generalized malaise. He was found to have hypotension and AKI. His AKI was likely multifactorial in the setting of dehydration, poor intake, ACE inhibitors, urinary retention, and medication side effects with NSAIDs. He had a repeat CT of the chest performed decreased size of known left lower lobe cancer, stable subcentimeter nodules, post radiation change involving the medial aspect of the left lung, left lower lobe bronchial wall thickening and airspace disease concerning for inflammatory or infectious change, persistent subcarinal adenopathy, and interval tracheostomy without evidence for complication.   He was treated with  antibiotics for possible pneumonia. He also had some acute urinary retention during his hospitalization. He was discharged on 04/23/20. He has a follow up with urology in the near future.   Since being discharged from the hospital, he feels "good". He has been feeling much better since discharge. He denies anymore pain. He denies fevers, chills, or night sweats. He lost several pounds since his last appointment due to dysphagia. His breathing is "good". He has been endorsing rib pain. No abnormalities were appreciated on his CT scan performed while admitted. He denies any significant shortness of breath. He reports a cough "every once in awhile". The patient denies any nausea, vomiting, or diarrhea. He has occasional constipation and takes metamucil.He denies any headache or visual changes. He denies any rashes or skin changes. He is here for evaluation before consideration of undergoing cycle #6 day 22.    MEDICAL HISTORY: Past Medical History:  Diagnosis Date  . Allergic rhinitis   . Asthma   . Carotid stenosis   . Colon cancer (HCC) 2003  . Diabetes mellitus (HCC)   . Diverticulosis   . Dyslipidemia   . ED (erectile dysfunction)   . GERD (gastroesophageal reflux disease)   . H/O degenerative disc disease   . Hemorrhoids   . HTN (hypertension)   . Hyperlipidemia   . Lung cancer (HCC)   . LVH (left ventricular hypertrophy)    on EKG  . Mass of lower lobe of left lung   . Mediastinal adenopathy   . Smoker    former  . Wears dentures   . Wears dentures   . Wears glasses   . Wears glasses     ALLERGIES:  has No Known Allergies.  MEDICATIONS:  Current Outpatient Medications  Medication Sig Dispense Refill  .  acetaminophen (TYLENOL) 500 MG tablet Take 1,000 mg by mouth every 4 (four) hours as needed.    Marland Kitchen amLODipine (NORVASC) 5 MG tablet Take 1 tablet (5 mg total) by mouth daily. 90 tablet 3  . amoxicillin-clavulanate (AUGMENTIN) 875-125 MG tablet Take 1 tablet by mouth every 12  (twelve) hours. (Patient taking differently: Take 1 tablet by mouth every 12 (twelve) hours. Finishes dose 04/27/2020) 8 tablet 0  . ANORO ELLIPTA 62.5-25 MCG/INH AEPB Inhale 1 puff into the lungs daily. (Patient not taking: No sig reported)    . aspirin EC 81 MG tablet Take 81 mg by mouth daily after breakfast.     . Blood Glucose Monitoring Suppl (ONETOUCH VERIO) w/Device KIT USE TO CHECK SUGAR DAILY 1 kit 0  . ferrous sulfate 325 (65 FE) MG EC tablet Take 1 tablet (325 mg total) by mouth daily with breakfast. 30 tablet 12  . HYDROcodone-acetaminophen (NORCO/VICODIN) 5-325 MG tablet Take 1-2 tablets by mouth every 6 (six) hours as needed for moderate pain. (Patient not taking: No sig reported) 20 tablet 0  . Lancets (ONETOUCH DELICA PLUS LANCET33G) MISC PATIENT IS TO TEST TWO TIMES A DAY DX: E11.9 (ONETOUCH VERIO FLEX) 100 each 12  . metFORMIN (GLUCOPHAGE-XR) 500 MG 24 hr tablet Take 1 tablet (500 mg total) by mouth daily. 90 tablet 1  . Multiple Vitamin (MULTIVITAMIN WITH MINERALS) TABS tablet Take 1 tablet by mouth daily after breakfast.     . polyethylene glycol (MIRALAX / GLYCOLAX) 17 g packet Take 17 g by mouth daily as needed for moderate constipation. (Patient not taking: No sig reported) 14 each 0  . senna-docusate (SENOKOT-S) 8.6-50 MG tablet Take 2 tablets by mouth 2 (two) times daily. (Patient not taking: No sig reported) 30 tablet 0  . solifenacin (VESICARE) 5 MG tablet Take 5 mg by mouth daily.     No current facility-administered medications for this visit.   Facility-Administered Medications Ordered in Other Visits  Medication Dose Route Frequency Provider Last Rate Last Admin  . sodium chloride flush (NS) 0.9 % injection 10 mL  10 mL Intracatheter PRN Si Gaul, MD   10 mL at 09/04/19 1556    SURGICAL HISTORY:  Past Surgical History:  Procedure Laterality Date  . BRONCHIAL BIOPSY  08/20/2019   Procedure: BRONCHIAL BIOPSIES;  Surgeon: Leslye Peer, MD;  Location: [facility]  ENDOSCOPY;  Service: Pulmonary;;  . BRONCHIAL BRUSHINGS  08/20/2019   Procedure: BRONCHIAL BRUSHINGS;  Surgeon: Leslye Peer, MD;  Location: [facility] ENDOSCOPY;  Service: Pulmonary;;  . BRONCHIAL NEEDLE ASPIRATION BIOPSY  08/20/2019   Procedure: BRONCHIAL NEEDLE ASPIRATION BIOPSIES;  Surgeon: Leslye Peer, MD;  Location: [facility] ENDOSCOPY;  Service: Pulmonary;;  . CARPAL TUNNEL RELEASE Right 01/09/2019   Procedure: CARPAL TUNNEL RELEASE;  Surgeon: Tarry Kos, MD;  Location: [facility];  Service: Orthopedics;  Laterality: Right;  . CATARACT EXTRACTION Right 2018  . COLONOSCOPY  2007   Dr. Elnoria Sami  . DIRECT LARYNGOSCOPY N/A 04/10/2020   Procedure: DIRECT LARYNGOSCOPY;  Surgeon: Christia Reading, MD;  Location: [facility] OR;  Service: ENT;  Laterality: N/A;  . I & D EXTREMITY Right 04/02/2016   Procedure: IRRIGATION AND DEBRIDEMENT GREAT TOE;  Surgeon: Tarry Kos, MD;  Location: [facility] OR;  Service: Orthopedics;  Laterality: Right;  . IR IMAGING GUIDED PORT INSERTION  08/30/2019  . MULTIPLE TOOTH EXTRACTIONS    . RADIOACTIVE SEED IMPLANT  2003  . TRACHEOSTOMY TUBE PLACEMENT N/A 04/10/2020   Procedure: TRACHEOSTOMY;  Surgeon: Christia Reading, MD;  Location: [facility] OR;  Service: ENT;  Laterality: N/A;  . ULNAR TUNNEL RELEASE Right 01/09/2019   Procedure: RIGHT CUBITAL TUNNEL RELEASE AND CARPAL TUNNEL RELEASE;  Surgeon: Tarry Kos, MD;  Location: [facility];  Service: Orthopedics;  Laterality: Right;  . UPPER GASTROINTESTINAL ENDOSCOPY    . VIDEO BRONCHOSCOPY N/A 12/18/2019   Procedure: VIDEO BRONCHOSCOPY WITHOUT FLUORO;  Surgeon: Leslye Peer, MD;  Location: [facility] ENDOSCOPY;  Service: Cardiopulmonary;  Laterality: N/A;  . VIDEO BRONCHOSCOPY WITH ENDOBRONCHIAL ULTRASOUND N/A 08/20/2019   Procedure: VIDEO BRONCHOSCOPY WITH ENDOBRONCHIAL ULTRASOUND;  Surgeon: Leslye Peer, MD;  Location: [facility] ENDOSCOPY;  Service: Pulmonary;  Laterality: N/A;    REVIEW OF SYSTEMS:   Constitutional: Negative for  appetite change, chills, fatigue, fever and unexpected weight change.  HENT: Negative for mouth sores, nosebleeds, sore throat and trouble swallowing.Positive for stidor. Eyes: Negative for eye problems and icterus.  Respiratory:Negative for significant cough, hemoptysis, and wheezing.  Cardiovascular: Negative for chest pain and leg swelling.  Gastrointestinal:Positive for constipation.Negative for abdominal pain, diarrhea, nausea and vomiting.  Genitourinary: Negative for bladder incontinence, difficulty urinating, dysuria, frequency and hematuria.  Musculoskeletal: Negative for back pain, gait problem, neck pain and neck stiffness.  Skin: Negative for itching and rash.  Neurological: Negative for dizziness, extremity weakness, gait problem, headaches, light-headedness and seizures.  Hematological: Negative for adenopathy. Does not bruise/bleed easily.  Psychiatric/Behavioral: Negative for confusion, depression and sleep disturbance. The patient is not nervous/anxious.     PHYSICAL EXAMINATION:  Blood pressure 121/66, pulse 80, temperature 97.7 F (36.5 C), temperature source Tympanic, resp. rate 15, height 5\' 7"  (1.702 m), weight 164 lb 6.4 oz (74.6 kg), SpO2 100 %.  ECOG PERFORMANCE STATUS: 1 - Symptomatic but completely ambulatory  Physical Exam  Constitutional: Oriented to person, place, and time and well-developed, well-nourished, and in no distress. No distress.  HENT:  Head: Normocephalic and atraumatic.  Mouth/Throat: Oropharynx is clear and moist. No oropharyngeal exudate.  Eyes: Conjunctivae are normal. Right eye exhibits no discharge. Left eye exhibits no discharge. No scleral icterus.  Neck: Normal range of motion. Neck supple. Tracheostomy in place.  Cardiovascular: Normal rate, regular rhythm, normal heart sounds and intact distal pulses.   Pulmonary/Chest: Effort normal and breath sounds normal. No respiratory distress. No wheezes. No rales.  Abdominal: Soft.  Bowel sounds are normal. Exhibits no distension and no mass. There is no tenderness.  Musculoskeletal: Normal range of motion. Exhibits no edema.  Lymphadenopathy:    No cervical adenopathy.  Neurological: Alert and oriented to person, place, and time. Exhibits normal muscle tone. Gait normal. Coordination normal.  Skin: Skin is warm and dry. No rash noted. Not diaphoretic. No erythema. No pallor.  Psychiatric: Mood, memory and judgment normal.  Vitals reviewed.  LABORATORY DATA: Lab Results  Component Value Date   WBC 6.2 04/29/2020   HGB 9.5 (L) 04/29/2020   HCT 28.9 (L) 04/29/2020   MCV 91.7 04/29/2020   PLT 436 (H) 04/29/2020      Chemistry      Component Value Date/Time   NA 139 04/29/2020 0805   NA 132 (L) 08/14/2019 0918   K 4.4 04/29/2020 0805   CL 104 04/29/2020 0805   CO2 24 04/29/2020 0805   BUN 10 04/29/2020 0805   BUN 16 08/14/2019 0918   CREATININE 1.10 04/29/2020 0805   CREATININE 1.25 (H) 08/08/2016 0912      Component Value Date/Time   CALCIUM 9.3 04/29/2020 0805  ALKPHOS 51 04/29/2020 0805   AST 19 04/29/2020 0805   ALT 22 04/29/2020 0805   BILITOT 0.4 04/29/2020 0805       RADIOGRAPHIC STUDIES:  DG Chest 2 View  Addendum Date: 04/19/2020   ADDENDUM REPORT: 04/19/2020 20:43 ADDENDUM: These results were called by telephone at the time of interpretation on 04/19/2020 at 8:43 pm to provider Gerhard Munch , who verbally acknowledged these results. Electronically Signed   By: Donzetta Kohut M.D.   On: 04/19/2020 20:43   Result Date: 04/19/2020 CLINICAL DATA:  Chest pain, history of asthma and carotid stenosis. Also with history of lung cancer. EXAM: CHEST - 2 VIEW COMPARISON:  March 17, 2020 FINDINGS: RIGHT-sided Port-A-Cath terminates at the caval to atrial junction. Tracheostomy tube in place. This is new compared to previous imaging. Cardiomediastinal contours accentuated by portable technique and AP projection. Low lung volumes. Perihilar and LEFT  upper lobe consolidative changes in the LEFT chest following treatment for lung cancer. Accentuation of aortic contour on the current study though portable projection limits assessment. RIGHT lower lobe consolidation. Leads project over the chest bilaterally. IMPRESSION: 1. Perihilar and LEFT upper lobe consolidative changes are similar to prior exam. 2. RIGHT lower lobe consolidative changes also present not as well evaluated as on previous studies. 3. Accentuated aortic contour may be related to increasing adjacent airspace disease due to superimposed pneumonia or increasing confluence of post treatment changes. In the setting of chest pain CT of the chest may be helpful to exclude acute aortic process though this is favored to represent hilar disease and increasing confluence of post treatment changes. A call is out to the referring provider further discuss findings in the above case. Electronically Signed: By: Donzetta Kohut M.D. On: 04/19/2020 20:31   CT CHEST WO CONTRAST  Result Date: 04/19/2020 CLINICAL DATA:  Stage IV non-small cell lung cancer left lower lobe, recent tracheostomy, pain at tracheostomy site and within the left chest. EXAM: CT CHEST WITHOUT CONTRAST TECHNIQUE: Multidetector CT imaging of the chest was performed following the standard protocol without IV contrast. COMPARISON:  03/06/2020 FINDINGS: Cardiovascular: Unenhanced imaging of the heart and great vessels demonstrates no pericardial effusion. Normal caliber of the thoracic aorta. Evaluation of the vascular lumen is limited without IV contrast. Extensive atherosclerosis of the aorta and coronary vasculature again noted. Right chest wall port via internal jugular approach tip within the superior vena cava. Mediastinum/Nodes: Tracheostomy tube identified, tip well above carina. No abnormalities at the tracheostomy site on this unenhanced exam. Thyroid and esophagus are grossly normal. There is persistent subcarinal adenopathy, though  exact measurements are difficult due to the lack of IV contrast. No new adenopathy is visualized. Lungs/Pleura: Progressive consolidation within the medial aspect of the left lung consistent with post radiation change. The known left lower lobe lung cancer is obscured by the surrounding consolidation, measuring approximately 2.5 x 1.8 cm reference image 74/4. There is no evidence of residual cavitation. Subpleural left upper lobe pulmonary nodule measuring 5 mm image 48/4 unchanged. Left lower lobe subpleural pulmonary nodule measuring 8 mm image 105/4 unchanged. Trace left pleural effusion is again noted, without significant change. New left lower lobe bronchial wall thickening and scattered tree in bud nodular opacities are likely related to underlying inflammation or infection. Minimal dependent right lower lobe atelectasis. Otherwise the right lung is clear. No evidence of pneumothorax. Upper Abdomen: No acute abnormality. Musculoskeletal: No acute or destructive bony lesions. Reconstructed images demonstrate no additional findings. IMPRESSION: 1. Decreased size of  the known left lower lobe lung cancer, consistent with response to therapy. Resolution of the peripheral cavitation seen previously. 2. Stable subcentimeter left upper and left lower lobe pulmonary nodules. 3. Progressive consolidation within the medial aspect of the left lung consistent with post radiation change. 4. Left lower lobe bronchial wall thickening and scattered tree in bud nodular airspace disease, consistent with inflammatory or infectious change. 5. Persistent subcarinal adenopathy. Exact measurements of the enlarged lymph node are limited without IV contrast. 6. Interval tracheostomy, without evidence of complication. 7. Stable trace left pleural effusion. Electronically Signed   By: Sharlet Salina M.D.   On: 04/19/2020 22:06   US RENAL  Result Date: 04/20/2020 CLINICAL DATA:  80 year old male with acute renal insufficiency. Lung  cancer. EXAM: RENAL / URINARY TRACT ULTRASOUND COMPLETE COMPARISON:  CT Chest, Abdomen, and Pelvis 03/06/2020. chest CT without contrast 04/19/2020. FINDINGS: Right Kidney: Renal measurements: 10.0 x 3.3 x 5.0 cm = volume: 85 mL. Renal cortex echogenicity appears increased on images 3, 8. There is a solitary small benign upper pole parapelvic cyst redemonstrated. No hydronephrosis or solid right renal mass. Left Kidney: Renal measurements: 9.7 x 6.1 x 5.8 cm = volume: 179 mL. Left renal cortical echogenicity appears better preserved (image 17). No left hydronephrosis or renal lesion. Bladder: Decompressed, Foley catheter balloon visible. Other: None. IMPRESSION: 1. No acute renal finding.  Bladder decompressed by Foley. 2. Decreased right renal size and increased cortical echogenicity suggesting sequelae of chronic medical renal disease. Left kidney appears more normal for age. Electronically Signed   By: Odessa Fleming M.D.   On: 04/20/2020 10:55   DG Swallowing Func-Speech Pathology  Result Date: 04/20/2020 Objective Swallowing Evaluation: Type of Study: MBS-Modified Barium Swallow Study  Patient Details Name: Shaun Kennedy MRN: [id number] Date of Birth: 1940/08/09 Today's Date: 04/20/2020 Time: SLP Start Time (ACUTE ONLY): 1300 -SLP Stop Time (ACUTE ONLY): 1315 SLP Time Calculation (min) (ACUTE ONLY): 15 min Past Medical History: Past Medical History: Diagnosis Date . Allergic rhinitis  . Asthma  . Carotid stenosis  . Colon cancer (HCC) 2003 . Diabetes mellitus (HCC)  . Diverticulosis  . Dyslipidemia  . ED (erectile dysfunction)  . GERD (gastroesophageal reflux disease)  . H/O degenerative disc disease  . Hemorrhoids  . HTN (hypertension)  . Hyperlipidemia  . Lung cancer (HCC)  . LVH (left ventricular hypertrophy)   on EKG . Mass of lower lobe of left lung  . Mediastinal adenopathy  . Smoker   former . Wears dentures  . Wears dentures  . Wears glasses  . Wears glasses  Past Surgical History: Past Surgical History:  Procedure Laterality Date . BRONCHIAL BIOPSY  08/20/2019  Procedure: BRONCHIAL BIOPSIES;  Surgeon: Leslye Peer, MD;  Location: [facility] ENDOSCOPY;  Service: Pulmonary;; . BRONCHIAL BRUSHINGS  08/20/2019  Procedure: BRONCHIAL BRUSHINGS;  Surgeon: Leslye Peer, MD;  Location: [facility] ENDOSCOPY;  Service: Pulmonary;; . BRONCHIAL NEEDLE ASPIRATION BIOPSY  08/20/2019  Procedure: BRONCHIAL NEEDLE ASPIRATION BIOPSIES;  Surgeon: Leslye Peer, MD;  Location: [facility] ENDOSCOPY;  Service: Pulmonary;; . CARPAL TUNNEL RELEASE Right 01/09/2019  Procedure: CARPAL TUNNEL RELEASE;  Surgeon: Tarry Kos, MD;  Location: [facility];  Service: Orthopedics;  Laterality: Right; . CATARACT EXTRACTION Right 2018 . COLONOSCOPY  2007  Dr. Elnoria Tahmid . DIRECT LARYNGOSCOPY N/A 04/10/2020  Procedure: DIRECT LARYNGOSCOPY;  Surgeon: Christia Reading, MD;  Location: [facility] OR;  Service: ENT;  Laterality: N/A; . I & D EXTREMITY Right 04/02/2016  Procedure: IRRIGATION AND  DEBRIDEMENT GREAT TOE;  Surgeon: Tarry Kos, MD;  Location: [facility] OR;  Service: Orthopedics;  Laterality: Right; . IR IMAGING GUIDED PORT INSERTION  08/30/2019 . MULTIPLE TOOTH EXTRACTIONS   . RADIOACTIVE SEED IMPLANT  2003 . TRACHEOSTOMY TUBE PLACEMENT N/A 04/10/2020  Procedure: TRACHEOSTOMY;  Surgeon: Christia Reading, MD;  Location: [facility] OR;  Service: ENT;  Laterality: N/A; . ULNAR TUNNEL RELEASE Right 01/09/2019  Procedure: RIGHT CUBITAL TUNNEL RELEASE AND CARPAL TUNNEL RELEASE;  Surgeon: Tarry Kos, MD;  Location: [facility];  Service: Orthopedics;  Laterality: Right; . UPPER GASTROINTESTINAL ENDOSCOPY   . VIDEO BRONCHOSCOPY N/A 12/18/2019  Procedure: VIDEO BRONCHOSCOPY WITHOUT FLUORO;  Surgeon: Leslye Peer, MD;  Location: [facility] ENDOSCOPY;  Service: Cardiopulmonary;  Laterality: N/A; . VIDEO BRONCHOSCOPY WITH ENDOBRONCHIAL ULTRASOUND N/A 08/20/2019  Procedure: VIDEO BRONCHOSCOPY WITH ENDOBRONCHIAL ULTRASOUND;  Surgeon: Leslye Peer, MD;  Location: [facility] ENDOSCOPY;  Service:  Pulmonary;  Laterality: N/A; HPI: 80 year old male with glottic stenosis and inspiratory stridor following a remote history of lung cancer (squamous cell carcinoma  with obstructive left lower lobe lung mass in addition to mediastinal lymphadenopathy as well as bilateral pulmonary nodules diagnosed in July 2021 with chemo and radiation) who was recently admitted on 04/10/20 for an elective tracheostomy.  He was discharged home with a Shiley cuffless trach  on 3/12. He was able to tolerate regular solids and thin liquids and wear his PMSV all waking hours without difficulty. He did not report any pain on the day of d/c. He went home and started having pain at trach site particularly when swallowing. He drank thin liquids and applesauce but refused most foods at home. Pt readmitted with dehydration.  Subjective: pain Assessment / Plan / Recommendation CHL IP CLINICAL IMPRESSIONS 04/20/2020 Clinical Impression Pt demonstrates severe odynophagia which makes him hesitate to swallow and at times he avoids swallowing by orally holding the bolus. This behavior leads to instances of poor timing with sensed aspiration before the swallow and also oral residue without swallow initaition to transit. When pt coughs to expectorate trace penetration pain is severe. There is no sign of weakness or primary pharyngeal impairment. There is a possiblity of mild edema in the area of the upper esophagus that impedes bolus flow. Largely it seems that laryngeal elevation for swallow pulls at trach site and makes swallowing severely painful. Attempted to view cervical/thoracic esophagus and trach site under fluoroscopy to see if any fistula could be identified,  but shadow of pts shoulder obscured the view. Recommend f/u with ENT. Pt to remain NPO until pain is better controlled. Pt may have ice in the meantime if he desires it. SLP Visit Diagnosis Dysphagia, oropharyngeal phase (R13.12) Attention and concentration deficit following -- Frontal  lobe and executive function deficit following -- Impact on safety and function Risk for inadequate nutrition/hydration;Mild aspiration risk   CHL IP TREATMENT RECOMMENDATION 04/20/2020 Treatment Recommendations Therapy as outlined in treatment plan below   Prognosis 04/20/2020 Prognosis for Safe Diet Advancement Good Barriers to Reach Goals -- Barriers/Prognosis Comment -- No flowsheet data found.  CHL IP OTHER RECOMMENDATIONS 04/20/2020 Recommended Consults Consider ENT evaluation Oral Care Recommendations Oral care QID Other Recommendations Place PMSV during PO intake;Have oral suction available   CHL IP FOLLOW UP RECOMMENDATIONS 04/20/2020 Follow up Recommendations None   CHL IP FREQUENCY AND DURATION 04/20/2020 Speech Therapy Frequency (ACUTE ONLY) min 2x/week Treatment Duration 2 weeks      CHL IP ORAL PHASE 04/20/2020 Oral Phase Impaired Oral -  Pudding Teaspoon -- Oral - Pudding Cup -- Oral - Honey Teaspoon -- Oral - Honey Cup -- Oral - Nectar Teaspoon -- Oral - Nectar Cup -- Oral - Nectar Straw -- Oral - Thin Teaspoon -- Oral - Thin Cup Decreased bolus cohesion;Lingual/palatal residue Oral - Thin Straw Lingual/palatal residue;Decreased bolus cohesion Oral - Puree -- Oral - Mech Soft -- Oral - Regular -- Oral - Multi-Consistency -- Oral - Pill -- Oral Phase - Comment --  CHL IP PHARYNGEAL PHASE 04/20/2020 Pharyngeal Phase Impaired Pharyngeal- Pudding Teaspoon -- Pharyngeal -- Pharyngeal- Pudding Cup -- Pharyngeal -- Pharyngeal- Honey Teaspoon -- Pharyngeal -- Pharyngeal- Honey Cup -- Pharyngeal -- Pharyngeal- Nectar Teaspoon -- Pharyngeal -- Pharyngeal- Nectar Cup -- Pharyngeal -- Pharyngeal- Nectar Straw -- Pharyngeal -- Pharyngeal- Thin Teaspoon -- Pharyngeal -- Pharyngeal- Thin Cup Penetration/Aspiration before swallow;Penetration/Apiration after swallow Pharyngeal Material enters airway, remains ABOVE vocal cords and not ejected out;Material enters airway, remains ABOVE vocal cords then ejected out;Material does  not enter airway Pharyngeal- Thin Straw Penetration/Aspiration before swallow;Trace aspiration Pharyngeal Material enters airway, passes BELOW cords then ejected out Pharyngeal- Puree WFL Pharyngeal -- Pharyngeal- Mechanical Soft -- Pharyngeal -- Pharyngeal- Regular -- Pharyngeal -- Pharyngeal- Multi-consistency -- Pharyngeal -- Pharyngeal- Pill -- Pharyngeal -- Pharyngeal Comment --  No flowsheet data found. Harlon Ditty, MA CCC-SLP Acute Rehabilitation Services Pager [phone] Office [phone] Dyanne Iha [name] 04/20/2020, 2:33 PM                ASSESSMENT/PLAN:  This is a very pleasant 80 year old [name] diagnosed with stage IV (T3, N2, M1a) non-small cell lung cancer, squamous cell carcinoma. He presented with a left lower lobe obstructive lung mass in addition to mediastinal lymphadenopathy. He also presented with bilateral pulmonary nodules. He was diagnosed in July 2021. His PD-L1 expression is negative.  Hecompletedpalliative radiotherapy to the obstructive lung mass under the care of Dr. Mitzi Kennedy in August 2021.  The patient is currently undergoing systemic chemotherapy with carboplatin and paclitaxel for 2 cycles in addition to immunotherapy with ipilimumab 1 mg/KG every 6 weeks and nivolumab 360 mg IV every 3 weeks.He is status post day1of cycle 6  The patient recently had a restaging CT scan while admitted to the hospital which showed continued decrease in size of the malignancy.  Labs were reviewed. His creatinine is greatly improved to baseline today. Recommend he proceed with cycle #6 day 22 today as scheduled.   We will see him back for a follow up visit in 3 weeks for evaluation before starting cycle #7.   He will continue to follow with urology for the prior urinary retention and ENT for management of the tracheostomy.   The patient was advised to call immediately if he has any concerning symptoms in the interval. The patient voices  understanding of current disease status and treatment options and is in agreement with the current care plan. All questions were answered. The patient knows to call the clinic with any problems, questions or concerns. We can certainly see the patient much sooner if necessary          No orders of the defined types were placed in this encounter.    I spent 20-29 minutes in this encounter.   Emelina Hinch L Jakyren Fluegge, PA-C 04/29/20

## 2020-04-27 NOTE — Telephone Encounter (Signed)
Have him bring in all the medications including the ones that they stopped next week so we can go over them in detail.

## 2020-04-27 NOTE — Telephone Encounter (Signed)
Done KH 

## 2020-04-29 ENCOUNTER — Inpatient Hospital Stay: Payer: Medicare HMO

## 2020-04-29 ENCOUNTER — Encounter: Payer: Self-pay | Admitting: Family Medicine

## 2020-04-29 ENCOUNTER — Other Ambulatory Visit: Payer: Self-pay | Admitting: Internal Medicine

## 2020-04-29 ENCOUNTER — Inpatient Hospital Stay: Payer: Medicare HMO | Attending: Internal Medicine

## 2020-04-29 ENCOUNTER — Encounter: Payer: Self-pay | Admitting: Physician Assistant

## 2020-04-29 ENCOUNTER — Inpatient Hospital Stay (HOSPITAL_BASED_OUTPATIENT_CLINIC_OR_DEPARTMENT_OTHER): Payer: Medicare HMO | Admitting: Physician Assistant

## 2020-04-29 ENCOUNTER — Telehealth (INDEPENDENT_AMBULATORY_CARE_PROVIDER_SITE_OTHER): Payer: Medicare HMO | Admitting: Family Medicine

## 2020-04-29 ENCOUNTER — Other Ambulatory Visit: Payer: Self-pay

## 2020-04-29 VITALS — BP 121/66 | HR 80 | Temp 97.7°F | Resp 15 | Ht 67.0 in | Wt 164.4 lb

## 2020-04-29 VITALS — BP 121/66 | Wt 164.4 lb

## 2020-04-29 DIAGNOSIS — J386 Stenosis of larynx: Secondary | ICD-10-CM | POA: Diagnosis not present

## 2020-04-29 DIAGNOSIS — C3432 Malignant neoplasm of lower lobe, left bronchus or lung: Secondary | ICD-10-CM

## 2020-04-29 DIAGNOSIS — E119 Type 2 diabetes mellitus without complications: Secondary | ICD-10-CM | POA: Insufficient documentation

## 2020-04-29 DIAGNOSIS — Z95828 Presence of other vascular implants and grafts: Secondary | ICD-10-CM

## 2020-04-29 DIAGNOSIS — C3490 Malignant neoplasm of unspecified part of unspecified bronchus or lung: Secondary | ICD-10-CM

## 2020-04-29 DIAGNOSIS — Z85038 Personal history of other malignant neoplasm of large intestine: Secondary | ICD-10-CM | POA: Diagnosis not present

## 2020-04-29 DIAGNOSIS — Z5111 Encounter for antineoplastic chemotherapy: Secondary | ICD-10-CM | POA: Insufficient documentation

## 2020-04-29 DIAGNOSIS — Z87891 Personal history of nicotine dependence: Secondary | ICD-10-CM | POA: Diagnosis not present

## 2020-04-29 DIAGNOSIS — Z5112 Encounter for antineoplastic immunotherapy: Secondary | ICD-10-CM

## 2020-04-29 DIAGNOSIS — Z923 Personal history of irradiation: Secondary | ICD-10-CM | POA: Diagnosis not present

## 2020-04-29 DIAGNOSIS — E785 Hyperlipidemia, unspecified: Secondary | ICD-10-CM | POA: Diagnosis not present

## 2020-04-29 DIAGNOSIS — Z79899 Other long term (current) drug therapy: Secondary | ICD-10-CM | POA: Insufficient documentation

## 2020-04-29 DIAGNOSIS — I10 Essential (primary) hypertension: Secondary | ICD-10-CM | POA: Insufficient documentation

## 2020-04-29 DIAGNOSIS — Z7984 Long term (current) use of oral hypoglycemic drugs: Secondary | ICD-10-CM | POA: Diagnosis not present

## 2020-04-29 DIAGNOSIS — Z7982 Long term (current) use of aspirin: Secondary | ICD-10-CM | POA: Diagnosis not present

## 2020-04-29 DIAGNOSIS — Z930 Tracheostomy status: Secondary | ICD-10-CM | POA: Diagnosis not present

## 2020-04-29 LAB — CMP (CANCER CENTER ONLY)
ALT: 22 U/L (ref 0–44)
AST: 19 U/L (ref 15–41)
Albumin: 3.1 g/dL — ABNORMAL LOW (ref 3.5–5.0)
Alkaline Phosphatase: 51 U/L (ref 38–126)
Anion gap: 11 (ref 5–15)
BUN: 10 mg/dL (ref 8–23)
CO2: 24 mmol/L (ref 22–32)
Calcium: 9.3 mg/dL (ref 8.9–10.3)
Chloride: 104 mmol/L (ref 98–111)
Creatinine: 1.1 mg/dL (ref 0.61–1.24)
GFR, Estimated: >60 mL/min (ref >60)
Glucose, Bld: 99 mg/dL (ref 70–99)
Potassium: 4.4 mmol/L (ref 3.5–5.1)
Sodium: 139 mmol/L (ref 135–145)
Total Bilirubin: 0.4 mg/dL (ref 0.3–1.2)
Total Protein: 7.5 g/dL (ref 6.5–8.1)

## 2020-04-29 LAB — CBC WITH DIFFERENTIAL (CANCER CENTER ONLY)
Abs Immature Granulocytes: 0.01 10*3/uL (ref 0.00–0.07)
Basophils Absolute: 0 10*3/uL (ref 0.0–0.1)
Basophils Relative: 1 %
Eosinophils Absolute: 0.1 10*3/uL (ref 0.0–0.5)
Eosinophils Relative: 2 %
HCT: 28.9 % — ABNORMAL LOW (ref 39.0–52.0)
Hemoglobin: 9.5 g/dL — ABNORMAL LOW (ref 13.0–17.0)
Immature Granulocytes: 0 %
Lymphocytes Relative: 19 %
Lymphs Abs: 1.2 10*3/uL (ref 0.7–4.0)
MCH: 30.2 pg (ref 26.0–34.0)
MCHC: 32.9 g/dL (ref 30.0–36.0)
MCV: 91.7 fL (ref 80.0–100.0)
Monocytes Absolute: 0.5 10*3/uL (ref 0.1–1.0)
Monocytes Relative: 9 %
Neutro Abs: 4.3 10*3/uL (ref 1.7–7.7)
Neutrophils Relative %: 69 %
Platelet Count: 436 10*3/uL — ABNORMAL HIGH (ref 150–400)
RBC: 3.15 MIL/uL — ABNORMAL LOW (ref 4.22–5.81)
RDW: 12.3 % (ref 11.5–15.5)
WBC Count: 6.2 10*3/uL (ref 4.0–10.5)
nRBC: 0 % (ref 0.0–0.2)

## 2020-04-29 LAB — TSH: TSH: 0.882 u[IU]/mL (ref 0.320–4.118)

## 2020-04-29 MED ORDER — HEPARIN SOD (PORK) LOCK FLUSH 100 UNIT/ML IV SOLN
500.0000 [IU] | Freq: Once | INTRAVENOUS | Status: AC | PRN
  Administered 2020-04-29: 500 [IU]
  Filled 2020-04-29: qty 5

## 2020-04-29 MED ORDER — SODIUM CHLORIDE 0.9 % IV SOLN
Freq: Once | INTRAVENOUS | Status: AC
  Filled 2020-04-29: qty 250

## 2020-04-29 MED ORDER — SODIUM CHLORIDE 0.9% FLUSH
10.0000 mL | INTRAVENOUS | Status: DC | PRN
  Administered 2020-04-29: 10 mL
  Filled 2020-04-29: qty 10

## 2020-04-29 MED ORDER — SODIUM CHLORIDE 0.9 % IV SOLN
360.0000 mg | Freq: Once | INTRAVENOUS | Status: AC
  Administered 2020-04-29: 360 mg via INTRAVENOUS
  Filled 2020-04-29: qty 24

## 2020-04-29 MED ORDER — SODIUM CHLORIDE 0.9% FLUSH
10.0000 mL | Freq: Once | INTRAVENOUS | Status: AC
  Administered 2020-04-29: 10 mL
  Filled 2020-04-29: qty 10

## 2020-04-29 NOTE — Progress Notes (Signed)
   Subjective:    Patient ID: [name], male    DOB: [birth year], 80 y.o.   MRN: [id number]  HPI Documentation for virtual audio and video telecommunications through [place] encounter: The patient was located at home. 2 patient identifiers used.  The provider was located in the office. The patient did consent to this visit and is aware of possible charges through their insurance for this visit. The other persons participating in this telemedicine service was the nurse Time spent on call was 10 minutes and in review of previous records >20 minutes total for counseling and coordination of care. This virtual service is not related to other E/M service within previous 7 days. Today's visit is to discuss his medication regimen as he did not bring the medications with him to his last visit.  He presently is taking a multivitamin, Metformin, vitamin D, Vesicare, ferrous sulfate and amlodipine. I then reviewed the medications that were stopped while he was in the hospital.  He will stop taking gabapentin, guaifenesin, and lidocaine, lorazepam.  He can continue on simvastatin and keep the Marinol around if he needs it but right now he is eating quite well.  We will also hold lisinopril for the time being.  He expressed understanding of all this. Review of Systems     Objective:   Physical Exam Alert and in no distress otherwise not examined       Assessment & Plan:  Medication management He will return for regular checkup in 3 to 4 months.

## 2020-04-29 NOTE — Patient Instructions (Signed)
[facility] Discharge Instructions for Patients Receiving Chemotherapy  Today you received the following chemotherapy agents Opdivo  To help prevent nausea and vomiting after your treatment, we encourage you to take your nausea medication as directed   If you develop nausea and vomiting that is not controlled by your nausea medication, call the clinic.   BELOW ARE SYMPTOMS THAT SHOULD BE REPORTED IMMEDIATELY:  *FEVER GREATER THAN 100.5 F  *CHILLS WITH OR WITHOUT FEVER  NAUSEA AND VOMITING THAT IS NOT CONTROLLED WITH YOUR NAUSEA MEDICATION  *UNUSUAL SHORTNESS OF BREATH  *UNUSUAL BRUISING OR BLEEDING  TENDERNESS IN MOUTH AND THROAT WITH OR WITHOUT PRESENCE OF ULCERS  *URINARY PROBLEMS  *BOWEL PROBLEMS  UNUSUAL RASH Items with * indicate a potential emergency and should be followed up as soon as possible.  Feel free to call the clinic should you have any questions or concerns. The clinic phone number is [phone].  Please show the CHEMO ALERT CARD at check-in to the Emergency Department and triage nurse.   

## 2020-04-29 NOTE — Patient Instructions (Signed)
Implanted Port Insertion, Care After This sheet gives you information about how to care for yourself after your procedure. Your health care provider may also give you more specific instructions. If you have problems or questions, contact your health care provider. What can I expect after the procedure? After the procedure, it is common to have:  Discomfort at the port insertion site.  Bruising on the skin over the port. This should improve over 3-4 days. Follow these instructions at home: Port care  After your port is placed, you will get a manufacturer's information card. The card has information about your port. Keep this card with you at all times.  Take care of the port as told by your health care provider. Ask your health care provider if you or a family member can get training for taking care of the port at home. A home health care nurse may also take care of the port.  Make sure to remember what type of port you have. Incision care  Follow instructions from your health care provider about how to take care of your port insertion site. Make sure you: ? Wash your hands with soap and water before and after you change your bandage (dressing). If soap and water are not available, use hand sanitizer. ? Change your dressing as told by your health care provider. ? Leave stitches (sutures), skin glue, or adhesive strips in place. These skin closures may need to stay in place for 2 weeks or longer. If adhesive strip edges start to loosen and curl up, you may trim the loose edges. Do not remove adhesive strips completely unless your health care provider tells you to do that.  Check your port insertion site every day for signs of infection. Check for: ? Redness, swelling, or pain. ? Fluid or blood. ? Warmth. ? Pus or a bad smell.      Activity  Return to your normal activities as told by your health care provider. Ask your health care provider what activities are safe for you.  Do not  lift anything that is heavier than 10 lb (4.5 kg), or the limit that you are told, until your health care provider says that it is safe. General instructions  Take over-the-counter and prescription medicines only as told by your health care provider.  Do not take baths, swim, or use a hot tub until your health care provider approves. Ask your health care provider if you may take showers. You may only be allowed to take sponge baths.  Do not drive for 24 hours if you were given a sedative during your procedure.  Wear a medical alert bracelet in case of an emergency. This will tell any health care providers that you have a port.  Keep all follow-up visits as told by your health care provider. This is important. Contact a health care provider if:  You cannot flush your port with saline as directed, or you cannot draw blood from the port.  You have a fever or chills.  You have redness, swelling, or pain around your port insertion site.  You have fluid or blood coming from your port insertion site.  Your port insertion site feels warm to the touch.  You have pus or a bad smell coming from the port insertion site. Get help right away if:  You have chest pain or shortness of breath.  You have bleeding from your port that you cannot control. Summary  Take care of the port as told by your   health care provider. Keep the manufacturer's information card with you at all times.  Change your dressing as told by your health care provider.  Contact a health care provider if you have a fever or chills or if you have redness, swelling, or pain around your port insertion site.  Keep all follow-up visits as told by your health care provider. This information is not intended to replace advice given to you by your health care provider. Make sure you discuss any questions you have with your health care provider. Document Revised: 08/22/2017 Document Reviewed: 08/22/2017 Elsevier Patient Education   2021 Elsevier Inc.  

## 2020-05-02 ENCOUNTER — Other Ambulatory Visit: Payer: Self-pay | Admitting: Family Medicine

## 2020-05-02 ENCOUNTER — Other Ambulatory Visit: Payer: Self-pay | Admitting: Gastroenterology

## 2020-05-02 DIAGNOSIS — N3281 Overactive bladder: Secondary | ICD-10-CM

## 2020-05-04 ENCOUNTER — Emergency Department (HOSPITAL_COMMUNITY)
Admission: EM | Admit: 2020-05-04 | Discharge: 2020-05-04 | Disposition: A | Discharge status: Home / Self Care | Payer: Medicare HMO | Attending: Emergency Medicine | Admitting: Emergency Medicine

## 2020-05-04 ENCOUNTER — Other Ambulatory Visit: Payer: Self-pay

## 2020-05-04 DIAGNOSIS — Z85118 Personal history of other malignant neoplasm of bronchus and lung: Secondary | ICD-10-CM | POA: Diagnosis not present

## 2020-05-04 DIAGNOSIS — Z8546 Personal history of malignant neoplasm of prostate: Secondary | ICD-10-CM | POA: Insufficient documentation

## 2020-05-04 DIAGNOSIS — I10 Essential (primary) hypertension: Secondary | ICD-10-CM | POA: Diagnosis not present

## 2020-05-04 DIAGNOSIS — Z7982 Long term (current) use of aspirin: Secondary | ICD-10-CM | POA: Insufficient documentation

## 2020-05-04 DIAGNOSIS — Z87891 Personal history of nicotine dependence: Secondary | ICD-10-CM | POA: Insufficient documentation

## 2020-05-04 DIAGNOSIS — R69 Illness, unspecified: Secondary | ICD-10-CM | POA: Diagnosis present

## 2020-05-04 DIAGNOSIS — J45909 Unspecified asthma, uncomplicated: Secondary | ICD-10-CM | POA: Insufficient documentation

## 2020-05-04 DIAGNOSIS — Z79899 Other long term (current) drug therapy: Secondary | ICD-10-CM | POA: Diagnosis not present

## 2020-05-04 DIAGNOSIS — J9509 Other tracheostomy complication: Secondary | ICD-10-CM | POA: Diagnosis not present

## 2020-05-04 DIAGNOSIS — E785 Hyperlipidemia, unspecified: Secondary | ICD-10-CM | POA: Diagnosis not present

## 2020-05-04 DIAGNOSIS — E1169 Type 2 diabetes mellitus with other specified complication: Secondary | ICD-10-CM | POA: Insufficient documentation

## 2020-05-04 DIAGNOSIS — R338 Other retention of urine: Secondary | ICD-10-CM | POA: Diagnosis not present

## 2020-05-04 DIAGNOSIS — Z7984 Long term (current) use of oral hypoglycemic drugs: Secondary | ICD-10-CM | POA: Insufficient documentation

## 2020-05-04 DIAGNOSIS — Z85038 Personal history of other malignant neoplasm of large intestine: Secondary | ICD-10-CM | POA: Diagnosis not present

## 2020-05-04 DIAGNOSIS — J386 Stenosis of larynx: Secondary | ICD-10-CM | POA: Diagnosis not present

## 2020-05-04 DIAGNOSIS — Z930 Tracheostomy status: Secondary | ICD-10-CM | POA: Diagnosis not present

## 2020-05-04 DIAGNOSIS — Z794 Long term (current) use of insulin: Secondary | ICD-10-CM | POA: Diagnosis not present

## 2020-05-04 DIAGNOSIS — E1140 Type 2 diabetes mellitus with diabetic neuropathy, unspecified: Secondary | ICD-10-CM | POA: Insufficient documentation

## 2020-05-04 NOTE — Progress Notes (Signed)
Subjective: His trach tube fell out and he was brought to the ER by EMS.  He is not having respiratory distress.  Objective: Vital signs in last 24 hours: Temp:  [98.8 F (37.1 C)] 98.8 F (37.1 C) (03/28 1246) Pulse Rate:  [89-109] 109 (03/28 1300) Resp:  [43] 43 (03/28 1300) BP: (125-170)/(84-93) 170/93 (03/28 1300) SpO2:  [98 %-100 %] 100 % (03/28 1300) Wt Readings from Last 1 Encounters:  04/29/20 74.6 kg    Intake/Output from previous day: No intake/output data recorded. Intake/Output this shift: No intake/output data recorded.  General appearance: alert, cooperative and no distress Neck: trach site wound with some bleeding  No results for input(s): WBC, HGB, HCT, PLT in the last 72 hours.  No results for input(s): NA, K, CL, CO2, GLUCOSE, BUN, CREATININE, CALCIUM in the last 72 hours.  Medications: I have reviewed the patient's current medications.  Assessment/Plan: Tracheostomy status, glottic stenosis  His #6 cuffless flexible Shiley trach replaced without difficulty.  He was given the new obturator and the use was reviewed.   LOS: 0 days   Melida Quitter 05/04/2020, 1:21 PM

## 2020-05-04 NOTE — ED Notes (Signed)
RT at bedside.

## 2020-05-04 NOTE — ED Triage Notes (Signed)
C/O trach is out, patient is ambulatory in triage, NAD. Sp02 100%; answers appropriately.

## 2020-05-04 NOTE — ED Notes (Signed)
Pt verbalizes understanding of discharge instructions. Opportunity for questions and answers were provided. Armband removed by staff, pt discharged from the ED.

## 2020-05-04 NOTE — ED Provider Notes (Signed)
[facility] EMERGENCY DEPARTMENT Provider Note   CSN: [id number] Arrival date & time: 05/04/20  1216     History Chief Complaint  Patient presents with  . Illness    Shaun Kennedy is a 80 y.o. male.  The history is provided by the patient.  Illness Location:  Neck Severity:  Mild Onset quality:  Gradual Duration:  3 hours Progression:  Unchanged Chronicity:  New Context:  Patient chronic trach came out, unable to get back in. Coughed really hard and it came out Relieved by:  Nothing Worsened by:  Nothing Associated symptoms: no chest pain, no congestion, no cough, no fever, no shortness of breath and no wheezing        Past Medical History:  Diagnosis Date  . Allergic rhinitis   . Asthma   . Carotid stenosis   . Colon cancer ([place]) 2003  . Diabetes mellitus ([place])   . Diverticulosis   . Dyslipidemia   . ED (erectile dysfunction)   . GERD (gastroesophageal reflux disease)   . H/O degenerative disc disease   . Hemorrhoids   . HTN (hypertension)   . Hyperlipidemia   . Lung cancer ([place])   . LVH (left ventricular hypertrophy)    on EKG  . Mass of lower lobe of left lung   . Mediastinal adenopathy   . Smoker    former  . Wears dentures   . Wears dentures   . Wears glasses   . Wears glasses     Patient Active Problem List   Diagnosis Date Noted  . Hypotension due to hypovolemia 04/20/2020  . Left lower lobe pneumonia 04/20/2020  . Pressure injury of skin 04/20/2020  . Acute renal failure (ARF) ([place]) 04/19/2020  . Pleuritic chest pain 03/17/2020  . 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 Vocal cord dysfunction 02/12/2020  . 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 Stridor 12/18/2019  . Abnormal findings on diagnostic imaging of lung 11/06/2019  . Antineoplastic chemotherapy induced anemia 10/16/2019  . Shortness of breath 10/15/2019  . Odynophagia 09/25/2019  . Port-A-Cath in place 09/17/2019  . Decreased appetite 09/03/2019  .  Malignant neoplasm of lower lobe of left lung ([place]) 08/28/2019  . Encounter for antineoplastic chemotherapy 08/24/2019  . Encounter for antineoplastic immunotherapy 08/24/2019  . Goals of care, counseling/discussion 08/24/2019  . Atherosclerosis of aorta ([place]) 04/09/2019  . Carpal tunnel syndrome, left upper limb 02/19/2019  . History of carpal tunnel surgery of right wrist 12/21/2018  . OAB (overactive bladder) 10/11/2017  . Diabetic nephropathy associated with type 2 diabetes mellitus ([place]) 10/11/2017  . Onychomycosis of multiple toenails with type 2 diabetes mellitus ([place]) 07/22/2015  . Former smoker, stopped smoking in distant past 07/22/2015  . Diabetes mellitus type II, non insulin dependent ([place]) 06/13/2011  . ED (erectile dysfunction) 06/13/2011  . History of prostate cancer 06/13/2011  . Hypertension associated with diabetes ([place]) 06/13/2011  . Hyperlipidemia associated with type 2 diabetes mellitus ([place]) 06/13/2011  . Allergic rhinitis, mild 06/13/2011  . History of asthma 06/13/2011  . Carotid stenosis 06/13/2011    Past Surgical History:  Procedure Laterality Date  . BRONCHIAL BIOPSY  08/20/2019   Procedure: BRONCHIAL BIOPSIES;  Surgeon: Collene Gobble, MD;  Location: [facility] ENDOSCOPY;  Service: Pulmonary;;  . BRONCHIAL BRUSHINGS  08/20/2019   Procedure: BRONCHIAL BRUSHINGS;  Surgeon: Collene Gobble, MD;  Location[facility] ENDOSCOPY;  Service: Pulmonary;;  . BRONCHIAL NEEDLE ASPIRATION BIOPSY  08/20/2019   Procedure: BRONCHIAL NEEDLE ASPIRATION BIOPSIES;  Surgeon: Collene Gobble, MD;  Location: [facility] ENDOSCOPY;  Service: Pulmonary;;  . CARPAL TUNNEL RELEASE Right 01/09/2019   Procedure: CARPAL TUNNEL RELEASE;  Surgeon: Leandrew Koyanagi, MD;  Location: [place];  Service: Orthopedics;  Laterality: Right;  . CATARACT EXTRACTION Right 2018  . COLONOSCOPY  2007   Dr. Benson Norway  . DIRECT LARYNGOSCOPY N/A 04/10/2020   Procedure: DIRECT LARYNGOSCOPY;  Surgeon: Melida Quitter, MD;   Location: [place];  Service: ENT;  Laterality: N/A;  . I & D EXTREMITY Right 04/02/2016   Procedure: IRRIGATION AND DEBRIDEMENT GREAT TOE;  Surgeon: Leandrew Koyanagi, MD;  Location: [place];  Service: Orthopedics;  Laterality: Right;  . IR IMAGING GUIDED PORT INSERTION  08/30/2019  . MULTIPLE TOOTH EXTRACTIONS    . RADIOACTIVE SEED IMPLANT  2003  . TRACHEOSTOMY TUBE PLACEMENT N/A 04/10/2020   Procedure: TRACHEOSTOMY;  Surgeon: Melida Quitter, MD;  Location: [place];  Service: ENT;  Laterality: N/A;  . ULNAR TUNNEL RELEASE Right 01/09/2019   Procedure: RIGHT CUBITAL TUNNEL RELEASE AND CARPAL TUNNEL RELEASE;  Surgeon: Leandrew Koyanagi, MD;  Location: [place];  Service: Orthopedics;  Laterality: Right;  . UPPER GASTROINTESTINAL ENDOSCOPY    . VIDEO BRONCHOSCOPY N/A 12/18/2019   Procedure: VIDEO BRONCHOSCOPY WITHOUT FLUORO;  Surgeon: Collene Gobble, MD;  Location: [facility] ENDOSCOPY;  Service: Cardiopulmonary;  Laterality: N/A;  . VIDEO BRONCHOSCOPY WITH ENDOBRONCHIAL ULTRASOUND N/A 08/20/2019   Procedure: VIDEO BRONCHOSCOPY WITH ENDOBRONCHIAL ULTRASOUND;  Surgeon: Collene Gobble, MD;  Location: [facility] ENDOSCOPY;  Service: Pulmonary;  Laterality: N/A;       Family History  Problem Relation Age of Onset  . Heart failure Mother   . Diabetes Mother   . Stroke Father   . Diabetes Father   . Diabetes Sister   . Diabetes Sister   . Lung cancer Sister 15  . Colon cancer Neg Hx   . Esophageal cancer Neg Hx   . Rectal cancer Neg Hx   . Colon polyps Neg Hx   . Stomach cancer Neg Hx     Social History   Tobacco Use  . Smoking status: Former Smoker    Packs/day: 1.00    Years: 32.00    Pack years: 32.00    Start date: 02/08/1955    Quit date: 10/09/1987    Years since quitting: 32.5  . Smokeless tobacco: Never Used  Vaping Use  . Vaping Use: Never used  Substance Use Topics  . Alcohol use: No  . Drug use: No    Home Medications Prior to Admission medications   Medication Sig Start Date End  Date Taking? Authorizing Provider  acetaminophen (TYLENOL) 500 MG tablet Take 1,000 mg by mouth every 4 (four) hours as needed.    [provider]  amLODipine (NORVASC) 5 MG tablet Take 1 tablet (5 mg total) by mouth daily. 04/07/20   [name], MD  amoxicillin-clavulanate (AUGMENTIN) 875-125 MG tablet Take 1 tablet by mouth every 12 (twelve) hours. Patient not taking: Reported on 04/29/2020 04/23/20   Nita Sells, MD  ANORO ELLIPTA 62.5-25 MCG/INH AEPB Inhale 1 puff into the lungs daily. Patient not taking: Reported on 04/29/2020 01/29/20   [provider]  aspirin EC 81 MG tablet Take 81 mg by mouth daily after breakfast.     [provider]  Blood Glucose Monitoring Suppl (ONETOUCH VERIO) w/Device KIT USE TO CHECK SUGAR DAILY 02/21/17   [name], MD  ferrous sulfate 325 (  65 FE) MG EC tablet Take 1 tablet (325 mg total) by mouth daily with breakfast. 04/07/20   [name], MD  HYDROcodone-acetaminophen (NORCO/VICODIN) 5-325 MG tablet Take 1-2 tablets by mouth every 6 (six) hours as needed for moderate pain. Patient not taking: No sig reported 04/15/20   Melida Quitter, MD  Lancets [facility] DELICA PLUS [id number]) [place] PATIENT IS TO TEST TWO TIMES A DAY DX: E11.9 (ONETOUCH VERIO FLEX) 04/23/19   [name], MD  metFORMIN (GLUCOPHAGE-XR) 500 MG 24 hr tablet Take 1 tablet (500 mg total) by mouth daily. 04/07/20   [name], MD  Multiple Vitamin (MULTIVITAMIN WITH MINERALS) TABS tablet Take 1 tablet by mouth daily after breakfast.     [provider]  polyethylene glycol (MIRALAX / GLYCOLAX) 17 g packet Take 17 g by mouth daily as needed for moderate constipation. Patient not taking: No sig reported 04/23/20   Nita Sells, MD  senna-docusate (SENOKOT-S) 8.6-50 MG tablet Take 2 tablets by mouth 2 (two) times daily. Patient not taking: No sig reported 04/23/20   Nita Sells, MD  solifenacin (VESICARE) 5 MG tablet Take 5 mg by  mouth daily.    [provider]    Allergies    Patient has no known allergies.  Review of Systems   Review of Systems  Constitutional: Negative for fever.  HENT: Negative for congestion, drooling and trouble swallowing.   Respiratory: Negative for apnea, cough, choking, chest tightness, shortness of breath and wheezing.   Cardiovascular: Negative for chest pain.    Physical Exam Updated Vital Signs BP (!) 142/75   Pulse 93   Temp 98.8 F (37.1 C) (Oral)   Resp (!) 21   SpO2 99%   Physical Exam Constitutional:      General: He is not in acute distress.    Appearance: He is not ill-appearing.  HENT:     Head:     Comments: Tracheostomy site in the neck is overall well-appearing, no bleeding, no infectious findings Eyes:     Pupils: Pupils are equal, round, and reactive to light.  Pulmonary:     Effort: Pulmonary effort is normal.     Breath sounds: Normal breath sounds.  Neurological:     Mental Status: He is alert.     ED Results / Procedures / Treatments   Labs (all labs ordered are listed, but only abnormal results are displayed) Labs Reviewed - No data to display  EKG None  Radiology No results found.  Procedures Procedures   Medications Ordered in ED Medications - No data to display  ED Course  I have reviewed the triage vital signs and the nursing notes.  Pertinent labs & imaging results that were available during my care of the patient were reviewed by me and considered in my medical decision making (see chart for details).    MDM Rules/Calculators/A&P                          [name] is here after his tracheostomy fell out.  Patient with glottic stenosis history.  Not in any respiratory distress.  States that he coughed to clear his throat and his tracheostomy fell out.  He was unable to put it back in.  Patient has a 6.5 cuffless Shiley.  Do not have obturator for his trach and so try to get a 4.5 cuff Shiley in while we  awaited central process and for 6.5 cuffless.  But were unsuccesful. Dr. Redmond Baseman had showed up with ENT and he was able to get back in 6.5 cuffless trach that came from central.  No issues afterwards.  Patient discharged.  This chart was dictated using voice recognition software.  Despite best efforts to proofread,  errors can occur which can change the documentation meaning.   Final Clinical Impression(s) / ED Diagnoses Final diagnoses:  Other tracheostomy complication [name])    Rx / DC Orders ED Discharge Orders    None       Lennice Sites, DO 05/04/20 1357

## 2020-05-07 NOTE — Discharge Summary (Signed)
Physician Discharge Summary  Shaun Kennedy BVQ:[id number] DOB: 09-02-1940 DOA: 04/19/2020  PCP: [name], MD  Admit date: 04/19/2020 Discharge date: 05/07/2020  Time spent: 35 minutes  Recommendations for Outpatient Follow-up:  1. See below  Discharge Diagnoses:  MAIN problem for hospitalization   Glottic stenosis Sepsis RULEd out this admit  Please see below for itemized issues addressed in HOpsital- refer to other progress notes for clarity if needed  Discharge Condition: fair  Diet recommendation: dys 3  Filed Weights   04/19/20 1943 04/20/20 0119  Weight: 77.1 kg 75.1 kg    History of present illness:  80 year old community dwelling black male Stage IV T3 N2 M1 NSCLC + glottic stenosis-trach 04/10/2020 and DC 04/15/2020 DM TY 2 HTN Developed discomfort peritrach, dysphagia-EMS called X 4 to house  In ED found to have azotemia BUN/creatinine 82/4.5 CT chest = decreased size lung cancer However left lower lobe bronchial thickening + airspace disease? Blood culture X2 performed  ENT evaluated 3/15-neck CT personally reviewed by them and informed that can take soft diet and follow-up in the outpatient setting  Sputum CX negative for infection Azithromycin ceftriaxone DC 3/16 Hospitalization complicated by hypoglycemia and mild metabolic encephalopathy as well as acute urinary retention with placement of Foley catheter 3/14   Hospital Course:  Glottic stenosis status post trach 3/4 Currently per SLP/ENT using dysphagia 3 diet Follow up OP with Dr. Redmond Baseman for routine care-I will CC him Presumed infection peristomal around trach Azithromycin ceftriaxone DC 3/16--change to Augmentin to complete 7 total days abx discontinued prior to d/c as infection was not thought to have occurred  AKI superimposed on CKD 2-etiology postobstructive + prerenal ACE inhibitor Acute urinary retention status post Foley 3/14 DC saline 3/16 Urine output has picked up since 314 and  is now nonoliguric Resume home dose of darifenacin 7.5 need outpatient urology follow-up for voiding trial and urodynamic studies-we will place name of urologist in chart Hematuria--likely transient and resolved Stage IV NSCLC Follow-up outpatient oncology--had appt 3/17 Appt cancelled and rescheduled 3/23 DM TY 2 A1c 5.9 on 1/22 CBG coverage 80s to 90s eating 10% of meals Prior to admission on Metformin 500 daily -we will resume HTN Continue amlodipine 5 daily  Discharge Exam: Vitals:   04/23/20 0851 04/23/20 1131  BP:  130/64  Pulse: 72 70  Resp: 16 17  Temp:  98.8 F (37.1 C)  SpO2: 100% 98%    Subj on day of d/c   Well no distress  General Exam on discharge   eomi ncat  Trach in place  No focal deficit abd soft Foley in place additionally  Discharge Instructions   Discharge Instructions    AMB Referral to [facility]   Complete by: As directed    [employer] Medicare: hospital referral PCP: Dr. Jill Alexanders, [place]  Please assign to [place] Coordinator for complex care Lurline Idol - cancer-  less than 30 days, no HHRN will take it] he does have home health for PT/OT/ST, also has a stage II sacral wound, family requesting support] and disease management follow up calls and assess for further needs.  Questions please call:   Natividad Brood, RN BSN [facility] Liaison  [phone] business mobile phone Toll free office [phone]  Fax number: [phone] [email or web address]   Reason for Referral: [facility] Disease Management ([employer] payers)   Disease managment services needed: Nurse Case Manager   Diagnoses of: Other   Other Diagnosis: New trach  Expected date of contact: Emergent - 3 Days   Diet - low sodium heart healthy   Complete by: As directed    Discharge instructions   Complete by: As directed    You will need to follow-up [facility] your [facility] care at [facility]-if you live closer to [place]-you can go to the office next to [place] you live closer to to [place] however may be a good idea to go to the new office that has opened up there  ]we will let Dr. Redmond Baseman know that you are discharging from the hospital so that he can follow-up your tracheotomy to ensure that everything is stable You were placed on antibiotics because you had thick secretion from the tracheotomy and we will try to clear that up with the Augmentin that we have prescribed  As mentioned you should use Tylenol for pain control around-the-clock as you needed-I would hesitate to use any opiate medications as this can harm you please do not overuse your guaifenesin syrup for your cough In addition we have discontinued you lisinopril HCTZ combination your Remeron your Naprosyn and your gabapentin and you can follow-up with your regular doctor to talk about reinitiation of these You may ultimately need to have your prostate looked at by a urologist Take care and good luc The nurse will teach you how to use the catheter and to change it to a leg bag   Discharge wound care:   Complete by: As directed    Pressure Injury 04/20/20 Buttocks Medial Stage 2 -  Partial thickness loss of dermis presenting as a shallow open injury with a red, pink wound bed without slough. 3 days   Increase activity slowly   Complete by: As directed      Allergies as of 04/23/2020   No Known Allergies     Medication List    STOP taking these medications   cholecalciferol 1000 units tablet Commonly known as: VITAMIN D   dronabinol 2.5 MG capsule Commonly known as: MARINOL   gabapentin 100 MG capsule Commonly known as: NEURONTIN   guaiFENesin-codeine 100-10 MG/5ML syrup   lidocaine-prilocaine cream Commonly known as: EMLA   lisinopril-hydrochlorothiazide 20-12.5 MG tablet Commonly known as: ZESTORETIC   mirtazapine 15 MG tablet Commonly known as: REMERON   naproxen sodium 220 MG  tablet Commonly known as: ALEVE   OneTouch Verio test strip Generic drug: glucose blood   simvastatin 40 MG tablet Commonly known as: ZOCOR     TAKE these medications   amLODipine 5 MG tablet Commonly known as: NORVASC Take 1 tablet (5 mg total) by mouth daily.   amoxicillin-clavulanate 875-125 MG tablet Commonly known as: AUGMENTIN Take 1 tablet by mouth every 12 (twelve) hours.   Anoro Ellipta 62.5-25 MCG/INH Aepb Generic drug: umeclidinium-vilanterol Inhale 1 puff into the lungs daily.   aspirin EC 81 MG tablet Take 81 mg by mouth daily after breakfast.   ferrous sulfate 325 (65 FE) MG EC tablet Take 1 tablet (325 mg total) by mouth daily with breakfast.   HYDROcodone-acetaminophen 5-325 MG tablet Commonly known as: NORCO/VICODIN Take 1-2 tablets by mouth every 6 (six) hours as needed for moderate pain.   metFORMIN 500 MG 24 hr tablet Commonly known as: GLUCOPHAGE-XR Take 1 tablet (500 mg total) by mouth daily.   multivitamin with minerals Tabs tablet Take 1 tablet by mouth daily after breakfast.   OneTouch Delica Plus [id number] Misc PATIENT IS TO TEST TWO TIMES A DAY DX: E11.9 (ONETOUCH VERIO FLEX)  OneTouch Verio w/Device Kit USE TO CHECK SUGAR DAILY   polyethylene glycol 17 g packet Commonly known as: MIRALAX / GLYCOLAX Take 17 g by mouth daily as needed for moderate constipation.   senna-docusate 8.6-50 MG tablet Commonly known as: Senokot-S Take 2 tablets by mouth 2 (two) times daily.   solifenacin 5 MG tablet Commonly known as: VESICARE Take 5 mg by mouth daily.            Discharge Care Instructions  (From admission, onward)         Start     Ordered   04/23/20 0000  Discharge wound care:       Comments: Pressure Injury 04/20/20 Buttocks Medial Stage 2 -  Partial thickness loss of dermis presenting as a shallow open injury with a red, pink wound bed without slough. 3 days   04/23/20 0950         No Known Allergies  Follow-up  Information    [facility]. Schedule an appointment as soon as possible for a visit in 2 week(s).   Why: cathter hospital follow up-=--may go to [facility] if closer Contact information: [place] [phone]               The results of significant diagnostics from this hospitalization (including imaging, microbiology, ancillary and laboratory) are listed below for reference.    Significant Diagnostic Studies: DG Chest 2 View  Addendum Date: 04/19/2020   ADDENDUM REPORT: 04/19/2020 20:43 ADDENDUM: These results were called by telephone at the time of interpretation on 04/19/2020 at 8:43 pm to provider Carmin Muskrat , who verbally acknowledged these results. Electronically Signed   By: Zetta Bills M.D.   On: 04/19/2020 20:43   Result Date: 04/19/2020 CLINICAL DATA:  Chest pain, history of asthma and carotid stenosis. Also with history of lung cancer. EXAM: CHEST - 2 VIEW COMPARISON:  March 17, 2020 FINDINGS: RIGHT-sided Port-A-Cath terminates at the caval to atrial junction. Tracheostomy tube in place. This is new compared to previous imaging. Cardiomediastinal contours accentuated by portable technique and AP projection. Low lung volumes. Perihilar and LEFT upper lobe consolidative changes in the LEFT chest following treatment for lung cancer. Accentuation of aortic contour on the current study though portable projection limits assessment. RIGHT lower lobe consolidation. Leads project over the chest bilaterally. IMPRESSION: 1. Perihilar and LEFT upper lobe consolidative changes are similar to prior exam. 2. RIGHT lower lobe consolidative changes also present not as well evaluated as on previous studies. 3. Accentuated aortic contour may be related to increasing adjacent airspace disease due to superimposed pneumonia or increasing confluence of post treatment changes. In the setting of chest pain CT of the chest may be  helpful to exclude acute aortic process though this is favored to represent hilar disease and increasing confluence of post treatment changes. A call is out to the referring provider further discuss findings in the above case. Electronically Signed: By: Zetta Bills M.D. On: 04/19/2020 20:31   CT CHEST WO CONTRAST  Result Date: 04/19/2020 CLINICAL DATA:  Stage IV non-small cell lung cancer left lower lobe, recent tracheostomy, pain at tracheostomy site and within the left chest. EXAM: CT CHEST WITHOUT CONTRAST TECHNIQUE: Multidetector CT imaging of the chest was performed following the standard protocol without IV contrast. COMPARISON:  03/06/2020 FINDINGS: Cardiovascular: Unenhanced imaging of the heart and great vessels demonstrates no pericardial effusion. Normal caliber of the thoracic aorta. Evaluation of the vascular  lumen is limited without IV contrast. Extensive atherosclerosis of the aorta and coronary vasculature again noted. Right chest wall port via internal jugular approach tip within the superior vena cava. Mediastinum/Nodes: Tracheostomy tube identified, tip well above carina. No abnormalities at the tracheostomy site on this unenhanced exam. Thyroid and esophagus are grossly normal. There is persistent subcarinal adenopathy, though exact measurements are difficult due to the lack of IV contrast. No new adenopathy is visualized. Lungs/Pleura: Progressive consolidation within the medial aspect of the left lung consistent with post radiation change. The known left lower lobe lung cancer is obscured by the surrounding consolidation, measuring approximately 2.5 x 1.8 cm reference image 74/4. There is no evidence of residual cavitation. Subpleural left upper lobe pulmonary nodule measuring 5 mm image 48/4 unchanged. Left lower lobe subpleural pulmonary nodule measuring 8 mm image 105/4 unchanged. Trace left pleural effusion is again noted, without significant change. New left lower lobe bronchial  wall thickening and scattered tree in bud nodular opacities are likely related to underlying inflammation or infection. Minimal dependent right lower lobe atelectasis. Otherwise the right lung is clear. No evidence of pneumothorax. Upper Abdomen: No acute abnormality. Musculoskeletal: No acute or destructive bony lesions. Reconstructed images demonstrate no additional findings. IMPRESSION: 1. Decreased size of the known left lower lobe lung cancer, consistent with response to therapy. Resolution of the peripheral cavitation seen previously. 2. Stable subcentimeter left upper and left lower lobe pulmonary nodules. 3. Progressive consolidation within the medial aspect of the left lung consistent with post radiation change. 4. Left lower lobe bronchial wall thickening and scattered tree in bud nodular airspace disease, consistent with inflammatory or infectious change. 5. Persistent subcarinal adenopathy. Exact measurements of the enlarged lymph node are limited without IV contrast. 6. Interval tracheostomy, without evidence of complication. 7. Stable trace left pleural effusion. Electronically Signed   By: Randa Ngo M.D.   On: 04/19/2020 22:06   US RENAL  Result Date: 04/20/2020 CLINICAL DATA:  80 year old male with acute renal insufficiency. Lung cancer. EXAM: RENAL / URINARY TRACT ULTRASOUND COMPLETE COMPARISON:  CT Chest, Abdomen, and Pelvis 03/06/2020. chest CT without contrast 04/19/2020. FINDINGS: Right Kidney: Renal measurements: 10.0 x 3.3 x 5.0 cm = volume: 85 mL. Renal cortex echogenicity appears increased on images 3, 8. There is a solitary small benign upper pole parapelvic cyst redemonstrated. No hydronephrosis or solid right renal mass. Left Kidney: Renal measurements: 9.7 x 6.1 x 5.8 cm = volume: 179 mL. Left renal cortical echogenicity appears better preserved (image 17). No left hydronephrosis or renal lesion. Bladder: Decompressed, Foley catheter balloon visible. Other: None. IMPRESSION: 1.  No acute renal finding.  Bladder decompressed by Foley. 2. Decreased right renal size and increased cortical echogenicity suggesting sequelae of chronic medical renal disease. Left kidney appears more normal for age. Electronically Signed   By: Genevie Ann M.D.   On: 04/20/2020 10:55   DG Swallowing Func-Speech Pathology  Result Date: 04/20/2020 Objective Swallowing Evaluation: Type of Study: MBS-Modified Barium Swallow Study  Patient Details Name: Shaun Kennedy MRN: [id number] Date of Birth: [birth year] Today's Date: 04/20/2020 Time: SLP Start Time (ACUTE ONLY): 1300 -SLP Stop Time (ACUTE ONLY): 1315 SLP Time Calculation (min) (ACUTE ONLY): 15 min Past Medical History: Past Medical History: Diagnosis Date . Allergic rhinitis  . Asthma  . Carotid stenosis  . Colon cancer ([place]) 2003 . Diabetes mellitus ([place])  . Diverticulosis  . Dyslipidemia  . ED (erectile dysfunction)  . GERD (gastroesophageal reflux disease)  . H/O degenerative  disc disease  . Hemorrhoids  . HTN (hypertension)  . Hyperlipidemia  . Lung cancer ([place])  . LVH (left ventricular hypertrophy)   on EKG . Mass of lower lobe of left lung  . Mediastinal adenopathy  . Smoker   former . Wears dentures  . Wears dentures  . Wears glasses  . Wears glasses  Past Surgical History: Past Surgical History: Procedure Laterality Date . BRONCHIAL BIOPSY  08/20/2019  Procedure: BRONCHIAL BIOPSIES;  Surgeon: Collene Gobble, MD;  Location: [facility] ENDOSCOPY;  Service: Pulmonary;; . BRONCHIAL BRUSHINGS  08/20/2019  Procedure: BRONCHIAL BRUSHINGS;  Surgeon: Collene Gobble, MD;  Location: [facility] ENDOSCOPY;  Service: Pulmonary;; . BRONCHIAL NEEDLE ASPIRATION BIOPSY  08/20/2019  Procedure: BRONCHIAL NEEDLE ASPIRATION BIOPSIES;  Surgeon: Collene Gobble, MD;  Location: [facility] ENDOSCOPY;  Service: Pulmonary;; . CARPAL TUNNEL RELEASE Right 01/09/2019  Procedure: CARPAL TUNNEL RELEASE;  Surgeon: Leandrew Koyanagi, MD;  Location: [place];  Service: Orthopedics;  Laterality: Right; .  CATARACT EXTRACTION Right 2018 . COLONOSCOPY  2007  Dr. Benson Norway . DIRECT LARYNGOSCOPY N/A 04/10/2020  Procedure: DIRECT LARYNGOSCOPY;  Surgeon: Melida Quitter, MD;  Location: [place];  Service: ENT;  Laterality: N/A; . I & D EXTREMITY Right 04/02/2016  Procedure: IRRIGATION AND DEBRIDEMENT GREAT TOE;  Surgeon: Leandrew Koyanagi, MD;  Location: [place];  Service: Orthopedics;  Laterality: Right; . IR IMAGING GUIDED PORT INSERTION  08/30/2019 . MULTIPLE TOOTH EXTRACTIONS   . RADIOACTIVE SEED IMPLANT  2003 . TRACHEOSTOMY TUBE PLACEMENT N/A 04/10/2020  Procedure: TRACHEOSTOMY;  Surgeon: Melida Quitter, MD;  Location: [place];  Service: ENT;  Laterality: N/A; . ULNAR TUNNEL RELEASE Right 01/09/2019  Procedure: RIGHT CUBITAL TUNNEL RELEASE AND CARPAL TUNNEL RELEASE;  Surgeon: Leandrew Koyanagi, MD;  Location: [place];  Service: Orthopedics;  Laterality: Right; . UPPER GASTROINTESTINAL ENDOSCOPY   . VIDEO BRONCHOSCOPY N/A 12/18/2019  Procedure: VIDEO BRONCHOSCOPY WITHOUT FLUORO;  Surgeon: Collene Gobble, MD;  Location: [facility] ENDOSCOPY;  Service: Cardiopulmonary;  Laterality: N/A; . VIDEO BRONCHOSCOPY WITH ENDOBRONCHIAL ULTRASOUND N/A 08/20/2019  Procedure: VIDEO BRONCHOSCOPY WITH ENDOBRONCHIAL ULTRASOUND;  Surgeon: Collene Gobble, MD;  Location: [facility] ENDOSCOPY;  Service: Pulmonary;  Laterality: N/A; HPI: 80 year old male with glottic stenosis and inspiratory stridor following a remote history of lung cancer (squamous cell carcinoma  with obstructive left lower lobe lung mass in addition to mediastinal lymphadenopathy as well as bilateral pulmonary nodules diagnosed in July 2021 with chemo and radiation) who was recently admitted on 04/10/20 for an elective tracheostomy.  He was discharged home with a Shiley cuffless trach  on 3/12. He was able to tolerate regular solids and thin liquids and wear his PMSV all waking hours without difficulty. He did not report any pain on the day of d/c. He went home and started having pain at trach site  particularly when swallowing. He drank thin liquids and applesauce but refused most foods at home. Pt readmitted with dehydration.  Subjective: pain Assessment / Plan / Recommendation CHL IP CLINICAL IMPRESSIONS 04/20/2020 Clinical Impression Pt demonstrates severe odynophagia which makes him hesitate to swallow and at times he avoids swallowing by orally holding the bolus. This behavior leads to instances of poor timing with sensed aspiration before the swallow and also oral residue without swallow initaition to transit. When pt coughs to expectorate trace penetration pain is severe. There is no sign of weakness or primary pharyngeal impairment. There is a possiblity of mild edema in the area of the  upper esophagus that impedes bolus flow. Largely it seems that laryngeal elevation for swallow pulls at trach site and makes swallowing severely painful. Attempted to view cervical/thoracic esophagus and trach site under fluoroscopy to see if any fistula could be identified,  but shadow of pts shoulder obscured the view. Recommend f/u with ENT. Pt to remain NPO until pain is better controlled. Pt may have ice in the meantime if he desires it. SLP Visit Diagnosis Dysphagia, oropharyngeal phase (R13.12) Attention and concentration deficit following -- Frontal lobe and executive function deficit following -- Impact on safety and function Risk for inadequate nutrition/hydration;Mild aspiration risk   CHL IP TREATMENT RECOMMENDATION 04/20/2020 Treatment Recommendations Therapy as outlined in treatment plan below   Prognosis 04/20/2020 Prognosis for Safe Diet Advancement Good Barriers to Reach Goals -- Barriers/Prognosis Comment -- No flowsheet data found.  CHL IP OTHER RECOMMENDATIONS 04/20/2020 Recommended Consults Consider ENT evaluation Oral Care Recommendations Oral care QID Other Recommendations Place PMSV during PO intake;Have oral suction available   CHL IP FOLLOW UP RECOMMENDATIONS 04/20/2020 Follow up Recommendations  None   CHL IP FREQUENCY AND DURATION 04/20/2020 Speech Therapy Frequency (ACUTE ONLY) min 2x/week Treatment Duration 2 weeks      CHL IP ORAL PHASE 04/20/2020 Oral Phase Impaired Oral - Pudding Teaspoon -- Oral - Pudding Cup -- Oral - Honey Teaspoon -- Oral - Honey Cup -- Oral - Nectar Teaspoon -- Oral - Nectar Cup -- Oral - Nectar Straw -- Oral - Thin Teaspoon -- Oral - Thin Cup Decreased bolus cohesion;Lingual/palatal residue Oral - Thin Straw Lingual/palatal residue;Decreased bolus cohesion Oral - Puree -- Oral - Mech Soft -- Oral - Regular -- Oral - Multi-Consistency -- Oral - Pill -- Oral Phase - Comment --  CHL IP PHARYNGEAL PHASE 04/20/2020 Pharyngeal Phase Impaired Pharyngeal- Pudding Teaspoon -- Pharyngeal -- Pharyngeal- Pudding Cup -- Pharyngeal -- Pharyngeal- Honey Teaspoon -- Pharyngeal -- Pharyngeal- Honey Cup -- Pharyngeal -- Pharyngeal- Nectar Teaspoon -- Pharyngeal -- Pharyngeal- Nectar Cup -- Pharyngeal -- Pharyngeal- Nectar Straw -- Pharyngeal -- Pharyngeal- Thin Teaspoon -- Pharyngeal -- Pharyngeal- Thin Cup Penetration/Aspiration before swallow;Penetration/Apiration after swallow Pharyngeal Material enters airway, remains ABOVE vocal cords and not ejected out;Material enters airway, remains ABOVE vocal cords then ejected out;Material does not enter airway Pharyngeal- Thin Straw Penetration/Aspiration before swallow;Trace aspiration Pharyngeal Material enters airway, passes BELOW cords then ejected out Pharyngeal- Puree WFL Pharyngeal -- Pharyngeal- Mechanical Soft -- Pharyngeal -- Pharyngeal- Regular -- Pharyngeal -- Pharyngeal- Multi-consistency -- Pharyngeal -- Pharyngeal- Pill -- Pharyngeal -- Pharyngeal Comment --  No flowsheet data found. Herbie Baltimore, MA CCC-SLP Acute Rehabilitation Services Pager [phone] Office [phone] Lynann Beaver 04/20/2020, 2:33 PM               Microbiology: No results found for this or any previous visit (from the past 240 hour(s)).    Labs: Basic Metabolic Panel: No results for input(s): NA, K, CL, CO2, GLUCOSE, BUN, CREATININE, CALCIUM, MG, PHOS in the last 168 hours. Liver Function Tests: No results for input(s): AST, ALT, ALKPHOS, BILITOT, PROT, ALBUMIN in the last 168 hours. No results for input(s): LIPASE, AMYLASE in the last 168 hours. No results for input(s): AMMONIA in the last 168 hours. CBC: No results for input(s): WBC, NEUTROABS, HGB, HCT, MCV, PLT in the last 168 hours. Cardiac Enzymes: No results for input(s): CKTOTAL, CKMB, CKMBINDEX, TROPONINI in the last 168 hours. BNP: BNP (last 3 results) Recent Labs    10/18/19 1240  BNP 107.5*    ProBNP (  last 3 results) No results for input(s): PROBNP in the last 8760 hours.  CBG: No results for input(s): GLUCAP in the last 168 hours.     Signed:  Nita Sells MD   [facility] 05/07/2020, 6:14 PM

## 2020-05-08 ENCOUNTER — Ambulatory Visit: Payer: Medicare HMO

## 2020-05-11 ENCOUNTER — Telehealth: Payer: Self-pay | Admitting: Internal Medicine

## 2020-05-11 NOTE — Telephone Encounter (Signed)
Pt wife was advised to have pt call back in the am. [facility]

## 2020-05-11 NOTE — Telephone Encounter (Signed)
Pt called and wants to know if he needs to continue on the vesicare 5mg . He has 6-7 pills left. If he needs to continue it then he will need a rx.

## 2020-05-11 NOTE — Telephone Encounter (Signed)
There for his bladder function.  Find out if it is working for him.

## 2020-05-12 ENCOUNTER — Telehealth: Payer: Self-pay

## 2020-05-12 ENCOUNTER — Other Ambulatory Visit: Payer: Self-pay

## 2020-05-12 MED ORDER — SOLIFENACIN SUCCINATE 5 MG PO TABS: 5.0000 mg | ORAL_TABLET | Freq: Every day | ORAL | 1 refills | Status: DC

## 2020-05-12 NOTE — Telephone Encounter (Signed)
[facility] pharmacy called stating pt. Was requesting a refill on his solifenacin pt. Last apt was 07/28/20 and next apt 04/29/20.

## 2020-05-12 NOTE — Patient Outreach (Signed)
[facility]) Care Management  05/12/2020  Shaun Kennedy 1940/09/02 [id number]   Telephone Assessment  Outreach attempt # 1 to patient. Spoke with spouse who reported that things were going well. Patient continues to make progress and recover. She voices that he did have to go to the ED last week. He coughed hard and trach accidentally came out and unable to be reinserted. In the ED the issue was fixed and patient has had no further issues. Spouse continues to report that her along with her son and his girlfriend are managing and caring for patient. She denies any RN CM needs or concerns at this time.   Medications Reviewed Today    Reviewed by [name], RN (Registered Nurse) on 05/12/20 at [place] List Status: <None>  Medication Order Taking? Sig Documenting Provider Last Dose Status Informant  acetaminophen (TYLENOL) 500 MG tablet [id number] No Take 1,000 mg by mouth every 4 (four) hours as needed. [provider] Taking Active Self  amLODipine (NORVASC) 5 MG tablet [id number] No Take 1 tablet (5 mg total) by mouth daily. [name], MD Taking Active Spouse/Significant Other  amoxicillin-clavulanate (AUGMENTIN) 875-125 MG tablet [id number] No Take 1 tablet by mouth every 12 (twelve) hours.  Patient not taking: Reported on 04/29/2020   Nita Sells, MD Not Taking Active Self  Celedonio Miyamoto 62.5-25 MCG/INH AEPB [id number] No Inhale 1 puff into the lungs daily.  Patient not taking: Reported on 04/29/2020   [provider] Not Taking Active   aspirin EC 81 MG tablet [id number] No Take 81 mg by mouth daily after breakfast.  [provider] Taking Active Spouse/Significant Other  Blood Glucose Monitoring Suppl [facility] VERIO) w/Device KIT [id number] No USE TO CHECK SUGAR DAILY [name], MD Taking Active Spouse/Significant Other  ferrous sulfate 325 (65 FE) MG EC tablet [id number] No Take 1 tablet (325 mg total) by mouth  daily with breakfast. [name], MD Taking Active Spouse/Significant Other  HYDROcodone-acetaminophen (NORCO/VICODIN) 5-325 MG tablet [id number] No Take 1-2 tablets by mouth every 6 (six) hours as needed for moderate pain.  Patient not taking: No sig reported   Melida Quitter, MD Not Taking Active            Med Note Marcellina Millin Apr 20, 2020  7:43 AM) LF on 04-15-20 # 20 DS 3  Lancets (ONETOUCH DELICA PLUS [id number]) [place] [id number] No PATIENT IS TO TEST TWO TIMES A DAY DX: E11.9 (ONETOUCH VERIO FLEX) [name], MD Taking Active Spouse/Significant Other  metFORMIN (GLUCOPHAGE-XR) 500 MG 24 hr tablet [id number] No Take 1 tablet (500 mg total) by mouth daily. [name], MD Taking Active Spouse/Significant Other  Multiple Vitamin (MULTIVITAMIN WITH MINERALS) TABS tablet [id number] No Take 1 tablet by mouth daily after breakfast.  [provider] Taking Active Spouse/Significant Other  polyethylene glycol (MIRALAX / GLYCOLAX) 17 g packet [id number] No Take 17 g by mouth daily as needed for moderate constipation.  Patient not taking: No sig reported   Nita Sells, MD Not Taking Active   senna-docusate (SENOKOT-S) 8.6-50 MG tablet [id number] No Take 2 tablets by mouth 2 (two) times daily.  Patient not taking: No sig reported   Nita Sells, MD Not Taking Active   solifenacin (VESICARE) 5 MG tablet [id number] No Take 5 mg by mouth daily. [provider] Taking Active            Goals Addressed  This Visit's Progress   .  (THN)Make and Keep All Appointments (pt-stated)        Timeframe:  Short-Term Goal Priority:  High Start Date:   04/27/2020                          Expected End Date:  May 2022                     Follow Up Date May 2022   - call to cancel if needed - keep a calendar with appointment dates    Why is this important?    Part of staying healthy is seeing the doctor for follow-up care.   If you  forget your appointments, there are some things you can do to stay on track.    Notes:  04/27/2020-Patient had PCP follow up appt today.   05/12/2020-Spouse reports patient goes for oncology appt on 05/14/20.    .  (THN)Set My Target A1C-Diabetes Type 2 (pt-stated)        Timeframe:  Long-Range Goal Priority:  Medium Start Date: 04/27/2020                            Expected End Date:  June 2022                     Follow Up Date June 2022   - set target A1C    Why is this important?    Your target A1C is decided together by you and your doctor.   It is based on several things like your age and other health issues.    Notes:  04/27/2020-Patient went for PCP appt today and had A1C level drawn-results pending. Last A1C on file 5.9(Jan 2022).  05/12/2020-Spouse reports appetite gradually increasing-blood sugars WNL. A1C lab result was 6.2(04/27/20)    .  (THN)Track and Manage My Symptoms- (pt-stated)        Timeframe:  Short-Term Goal Priority:  High Start Date:   04/27/2020                          Expected End Date:  May 2022                    Follow Up Date May 2022   - develop a rescue plan - follow rescue plan if symptoms flare-up - keep follow-up appointments  -perform trach care daily as ordered   Why is this important?    Tracking your symptoms and other information about your health helps your doctor plan your care.   Write down the symptoms, the time of day, what you were doing and what medicine you are taking.   You will soon learn how to manage your symptoms.     Notes:  04/27/2020-Spoke with souse and patient's son girlfriend(Angie) whoa re both assisting with managing patient's care and trach. They voice being knowledgeable and comfortable performing trach care in the home. They have contact info for DME company.   05/12/20- Spouse denies any current sxs/issues. Lurline Idol did accidentally become dislodged and was replaced during ED visit. Family aware of s/s of worsening  condition and when to seek medical attention.        Plan: RN CM discussed with caregiver next outreach within the month of May. Caregiver gave verbal consent and in agreement with RN  CM follow up and timeframe. Caregiver aware that they may contact RN CM sooner for any issues or concerns. RN CM reviewed goals and plan of care with caregiver. Caregiver agrees to care plan and follow up.  [name], RN,BSN,CCM [place] [facility] Telephonic Care Management Coordinator Direct Phone: [phone] Toll Free: [phone] Fax: [phone]

## 2020-05-13 ENCOUNTER — Other Ambulatory Visit: Payer: Medicare HMO

## 2020-05-13 ENCOUNTER — Ambulatory Visit: Payer: Medicare HMO | Admitting: Internal Medicine

## 2020-05-13 ENCOUNTER — Ambulatory Visit: Payer: Medicare HMO

## 2020-05-13 DIAGNOSIS — C3432 Malignant neoplasm of lower lobe, left bronchus or lung: Secondary | ICD-10-CM | POA: Diagnosis not present

## 2020-05-13 DIAGNOSIS — Z930 Tracheostomy status: Secondary | ICD-10-CM | POA: Diagnosis not present

## 2020-05-13 DIAGNOSIS — J386 Stenosis of larynx: Secondary | ICD-10-CM | POA: Diagnosis not present

## 2020-05-14 DIAGNOSIS — N3000 Acute cystitis without hematuria: Secondary | ICD-10-CM | POA: Diagnosis not present

## 2020-05-14 DIAGNOSIS — R310 Gross hematuria: Secondary | ICD-10-CM | POA: Diagnosis not present

## 2020-05-14 DIAGNOSIS — R338 Other retention of urine: Secondary | ICD-10-CM | POA: Diagnosis not present

## 2020-05-15 ENCOUNTER — Other Ambulatory Visit (HOSPITAL_COMMUNITY)
Admission: RE | Admit: 2020-05-15 | Discharge: 2020-05-15 | Disposition: A | Discharge status: Home / Self Care | Payer: Medicare HMO | Source: Ambulatory Visit | Attending: Internal Medicine | Admitting: Internal Medicine

## 2020-05-15 DIAGNOSIS — Z01812 Encounter for preprocedural laboratory examination: Secondary | ICD-10-CM | POA: Diagnosis not present

## 2020-05-15 DIAGNOSIS — Z20822 Contact with and (suspected) exposure to covid-19: Secondary | ICD-10-CM | POA: Insufficient documentation

## 2020-05-15 LAB — SARS CORONAVIRUS 2 (TAT 6-24 HRS): SARS Coronavirus 2: NEGATIVE

## 2020-05-16 DIAGNOSIS — C3432 Malignant neoplasm of lower lobe, left bronchus or lung: Secondary | ICD-10-CM | POA: Diagnosis not present

## 2020-05-16 DIAGNOSIS — J386 Stenosis of larynx: Secondary | ICD-10-CM | POA: Diagnosis not present

## 2020-05-16 DIAGNOSIS — J383 Other diseases of vocal cords: Secondary | ICD-10-CM | POA: Diagnosis not present

## 2020-05-16 DIAGNOSIS — Z930 Tracheostomy status: Secondary | ICD-10-CM | POA: Diagnosis not present

## 2020-05-18 ENCOUNTER — Other Ambulatory Visit: Payer: Self-pay

## 2020-05-18 ENCOUNTER — Ambulatory Visit (HOSPITAL_COMMUNITY)
Admission: RE | Admit: 2020-05-18 | Discharge: 2020-05-18 | Disposition: A | Discharge status: Home / Self Care | Payer: Medicare HMO | Source: Ambulatory Visit | Attending: Acute Care | Admitting: Acute Care

## 2020-05-18 DIAGNOSIS — Z930 Tracheostomy status: Secondary | ICD-10-CM | POA: Insufficient documentation

## 2020-05-18 DIAGNOSIS — Z4682 Encounter for fitting and adjustment of non-vascular catheter: Secondary | ICD-10-CM | POA: Insufficient documentation

## 2020-05-18 DIAGNOSIS — J386 Stenosis of larynx: Secondary | ICD-10-CM | POA: Insufficient documentation

## 2020-05-18 DIAGNOSIS — J398 Other specified diseases of upper respiratory tract: Secondary | ICD-10-CM | POA: Diagnosis not present

## 2020-05-18 DIAGNOSIS — Z430 Encounter for attention to tracheostomy: Secondary | ICD-10-CM | POA: Diagnosis not present

## 2020-05-18 DIAGNOSIS — Z85118 Personal history of other malignant neoplasm of bronchus and lung: Secondary | ICD-10-CM | POA: Insufficient documentation

## 2020-05-18 DIAGNOSIS — F17210 Nicotine dependence, cigarettes, uncomplicated: Secondary | ICD-10-CM | POA: Insufficient documentation

## 2020-05-18 NOTE — Progress Notes (Signed)
Tracheostomy Procedure Note  Shaun Kennedy [id number] 11-Oct-1940  Pre Procedure Tracheostomy Information  Trach Brand: Shiley Size: 6.0 [id number] Style: Uncuffed Secured by: Velcro   Procedure: Trach cleaning and Trach change    Post Procedure Tracheostomy Information  Trach Brand: Shiley Size: 6.0  [id number] Style: Uncuffed Secured by: Velcro   Post Procedure Evaluation:  ETCO2 positive color change from yellow to purple : Yes.   Vital signs:VSS Patients current condition: stable Complications: No apparent complications Trach site exam: clean and dry Wound care done: 4 x 4 gauze drain Patient did tolerate procedure well.   Education: none  Prescription needs: none    Additional needs: Patient given a new PMV to take home

## 2020-05-18 NOTE — Progress Notes (Signed)
Reason for visit  Planned trach change and to get established w/ trach clinic  Referring MD: Redmond Baseman   HPI  80 year old male followed in our clinic for asthma and prior LLL lung cancer. Also had h/o tobacco abuse DM type II and carotid stenosis. More recently has had increased WOB w/ stridor and upper airway noises 2/2 subglottic stenosis for which he ended up undergoing elective trach on 3/4 by Dr Redmond Baseman. From a trach stand-point he remains stable in the out-pt setting. He has been referred to trach clinic for routine management   PMH    has a past medical history of Allergic rhinitis, Asthma, Carotid stenosis, Colon cancer ([place]) (2003), Diabetes mellitus ([place]), Diverticulosis, Dyslipidemia, ED (erectile dysfunction), GERD (gastroesophageal reflux disease), H/O degenerative disc disease, Hemorrhoids, HTN (hypertension), Hyperlipidemia, Lung cancer ([place]), LVH (left ventricular hypertrophy), Mass of lower lobe of left lung, Mediastinal adenopathy, Smoker, Wears dentures, Wears dentures, Wears glasses, and Wears glasses.  Surgical hx   Past Surgical History:  Procedure Laterality Date  . BRONCHIAL BIOPSY  08/20/2019   Procedure: BRONCHIAL BIOPSIES;  Surgeon: Collene Gobble, MD;  Location: [facility] ENDOSCOPY;  Service: Pulmonary;;  . BRONCHIAL BRUSHINGS  08/20/2019   Procedure: BRONCHIAL BRUSHINGS;  Surgeon: Collene Gobble, MD;  Location: [facility] ENDOSCOPY;  Service: Pulmonary;;  . BRONCHIAL NEEDLE ASPIRATION BIOPSY  08/20/2019   Procedure: BRONCHIAL NEEDLE ASPIRATION BIOPSIES;  Surgeon: Collene Gobble, MD;  Location: [facility] ENDOSCOPY;  Service: Pulmonary;;  . CARPAL TUNNEL RELEASE Right 01/09/2019   Procedure: CARPAL TUNNEL RELEASE;  Surgeon: Leandrew Koyanagi, MD;  Location: [place];  Service: Orthopedics;  Laterality: Right;  . CATARACT EXTRACTION Right 2018  . COLONOSCOPY  2007   Dr. Benson Norway  . DIRECT LARYNGOSCOPY N/A 04/10/2020   Procedure: DIRECT LARYNGOSCOPY;  Surgeon: Melida Quitter, MD;   Location: [place];  Service: ENT;  Laterality: N/A;  . I & D EXTREMITY Right 04/02/2016   Procedure: IRRIGATION AND DEBRIDEMENT GREAT TOE;  Surgeon: Leandrew Koyanagi, MD;  Location: [place];  Service: Orthopedics;  Laterality: Right;  . IR IMAGING GUIDED PORT INSERTION  08/30/2019  . MULTIPLE TOOTH EXTRACTIONS    . RADIOACTIVE SEED IMPLANT  2003  . TRACHEOSTOMY TUBE PLACEMENT N/A 04/10/2020   Procedure: TRACHEOSTOMY;  Surgeon: Melida Quitter, MD;  Location: [place];  Service: ENT;  Laterality: N/A;  . ULNAR TUNNEL RELEASE Right 01/09/2019   Procedure: RIGHT CUBITAL TUNNEL RELEASE AND CARPAL TUNNEL RELEASE;  Surgeon: Leandrew Koyanagi, MD;  Location: [place];  Service: Orthopedics;  Laterality: Right;  . UPPER GASTROINTESTINAL ENDOSCOPY    . VIDEO BRONCHOSCOPY N/A 12/18/2019   Procedure: VIDEO BRONCHOSCOPY WITHOUT FLUORO;  Surgeon: Collene Gobble, MD;  Location: [facility] ENDOSCOPY;  Service: Cardiopulmonary;  Laterality: N/A;  . VIDEO BRONCHOSCOPY WITH ENDOBRONCHIAL ULTRASOUND N/A 08/20/2019   Procedure: VIDEO BRONCHOSCOPY WITH ENDOBRONCHIAL ULTRASOUND;  Surgeon: Collene Gobble, MD;  Location: [facility] ENDOSCOPY;  Service: Pulmonary;  Laterality: N/A;   Allergies   No Known Allergies  Home meds   Prior to Admission medications   Medication Sig Start Date End Date Taking? Authorizing Provider  acetaminophen (TYLENOL) 500 MG tablet Take 1,000 mg by mouth every 4 (four) hours as needed.    [provider]  amLODipine (NORVASC) 5 MG tablet Take 1 tablet (5 mg total) by mouth daily. 04/07/20   [name], MD  amoxicillin-clavulanate (AUGMENTIN) 875-125 MG tablet Take 1 tablet by mouth every 12 (twelve) hours. Patient  not taking: Reported on 04/29/2020 04/23/20   Nita Sells, MD  ANORO ELLIPTA 62.5-25 MCG/INH AEPB Inhale 1 puff into the lungs daily. Patient not taking: Reported on 04/29/2020 01/29/20   [provider]  aspirin EC 81 MG tablet Take 81 mg by mouth daily after  breakfast.     [provider]  Blood Glucose Monitoring Suppl (ONETOUCH VERIO) w/Device KIT USE TO CHECK SUGAR DAILY 02/21/17   [name], MD  ferrous sulfate 325 (65 FE) MG EC tablet Take 1 tablet (325 mg total) by mouth daily with breakfast. 04/07/20   [name], MD  HYDROcodone-acetaminophen (NORCO/VICODIN) 5-325 MG tablet Take 1-2 tablets by mouth every 6 (six) hours as needed for moderate pain. Patient not taking: No sig reported 04/15/20   Melida Quitter, MD  Lancets [facility] DELICA PLUS [id number]) [place] PATIENT IS TO TEST TWO TIMES A DAY DX: E11.9 (ONETOUCH VERIO FLEX) 04/23/19   [name], MD  metFORMIN (GLUCOPHAGE-XR) 500 MG 24 hr tablet Take 1 tablet (500 mg total) by mouth daily. 04/07/20   [name], MD  Multiple Vitamin (MULTIVITAMIN WITH MINERALS) TABS tablet Take 1 tablet by mouth daily after breakfast.     [provider]  polyethylene glycol (MIRALAX / GLYCOLAX) 17 g packet Take 17 g by mouth daily as needed for moderate constipation. Patient not taking: No sig reported 04/23/20   Nita Sells, MD  senna-docusate (SENOKOT-S) 8.6-50 MG tablet Take 2 tablets by mouth 2 (two) times daily. Patient not taking: No sig reported 04/23/20   Nita Sells, MD  solifenacin (VESICARE) 5 MG tablet Take 1 tablet (5 mg total) by mouth daily. 05/12/20   [name], MD   Review of Systems  Constitutional: Negative for chills, fever and weight loss.  HENT: Negative.   Eyes: Negative.   Respiratory: Negative for cough, hemoptysis, sputum production, shortness of breath and wheezing.        Can phonate when inner cannula occluded. Does have more SOB associated w/ inner cannula occlusion but this resolved w/ inner canula change  Cardiovascular: Negative.   Gastrointestinal: Negative.   Genitourinary: Negative.   Musculoskeletal: Negative.   Skin: Negative.   Neurological: Negative.   Endo/Heme/Allergies: Negative.   Psychiatric/Behavioral:  Negative.     .There were no vitals taken for this visit.  General Appearance:  Alert, cooperative, no distress, appears stated age  Head:  Normocephalic, without obvious abnormality, atraumatic  Eyes:  PERRL, conjunctiva/corneas clear, EOM's intact, both eyes.   Throat: Lips, mucosa, and tongue normal; teeth and gums normal  Neck: Supple, symmetrical, trachea midline, no adenopathy, trach site clear. His phonation is strong w/ trach occlusion but does have marked [place] noise that went away after trach change of inner cannula   Lungs:   Clear to auscultation bilaterally, respirations unlabored  Chest Wall:  No tenderness or deformity  Heart:  Regular rate and rhythm, S1, S2 normal, no murmur, rub or gallop  Abdomen:   Soft, non-tender, bowel sounds active all four quadrants,  no masses, no organomegaly  Extremities: Extremities normal, atraumatic, no cyanosis or edema  Pulses: 2+ and symmetric  Neurologic: Normal   Procedure  The current 6 flex trach was removed. The stoma inspected this was unremarkable. A new cuffless trach shiley was inserted w/out difficulty.   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   My time 24 minutes  Erick Colace ACNP-BC [place] Pager # [phone] OR # [phone] if no answer

## 2020-05-19 ENCOUNTER — Other Ambulatory Visit: Payer: Self-pay

## 2020-05-19 ENCOUNTER — Emergency Department (HOSPITAL_COMMUNITY): Payer: Medicare HMO

## 2020-05-19 ENCOUNTER — Emergency Department (HOSPITAL_COMMUNITY)
Admission: EM | Admit: 2020-05-19 | Discharge: 2020-05-19 | Disposition: A | Discharge status: Home / Self Care | Payer: Medicare HMO | Attending: Emergency Medicine | Admitting: Emergency Medicine

## 2020-05-19 ENCOUNTER — Encounter (HOSPITAL_COMMUNITY): Payer: Self-pay | Admitting: Emergency Medicine

## 2020-05-19 DIAGNOSIS — Z7982 Long term (current) use of aspirin: Secondary | ICD-10-CM | POA: Diagnosis not present

## 2020-05-19 DIAGNOSIS — Z87891 Personal history of nicotine dependence: Secondary | ICD-10-CM | POA: Insufficient documentation

## 2020-05-19 DIAGNOSIS — J9503 Malfunction of tracheostomy stoma: Secondary | ICD-10-CM

## 2020-05-19 DIAGNOSIS — Z85038 Personal history of other malignant neoplasm of large intestine: Secondary | ICD-10-CM | POA: Insufficient documentation

## 2020-05-19 DIAGNOSIS — E1140 Type 2 diabetes mellitus with diabetic neuropathy, unspecified: Secondary | ICD-10-CM | POA: Insufficient documentation

## 2020-05-19 DIAGNOSIS — J45909 Unspecified asthma, uncomplicated: Secondary | ICD-10-CM | POA: Diagnosis not present

## 2020-05-19 DIAGNOSIS — Z8546 Personal history of malignant neoplasm of prostate: Secondary | ICD-10-CM | POA: Insufficient documentation

## 2020-05-19 DIAGNOSIS — Z85118 Personal history of other malignant neoplasm of bronchus and lung: Secondary | ICD-10-CM | POA: Insufficient documentation

## 2020-05-19 DIAGNOSIS — J398 Other specified diseases of upper respiratory tract: Secondary | ICD-10-CM | POA: Diagnosis not present

## 2020-05-19 DIAGNOSIS — Z79899 Other long term (current) drug therapy: Secondary | ICD-10-CM | POA: Diagnosis not present

## 2020-05-19 DIAGNOSIS — I10 Essential (primary) hypertension: Secondary | ICD-10-CM | POA: Diagnosis not present

## 2020-05-19 DIAGNOSIS — Z7984 Long term (current) use of oral hypoglycemic drugs: Secondary | ICD-10-CM | POA: Insufficient documentation

## 2020-05-19 DIAGNOSIS — E1121 Type 2 diabetes mellitus with diabetic nephropathy: Secondary | ICD-10-CM | POA: Insufficient documentation

## 2020-05-19 DIAGNOSIS — R918 Other nonspecific abnormal finding of lung field: Secondary | ICD-10-CM | POA: Diagnosis not present

## 2020-05-19 DIAGNOSIS — C3490 Malignant neoplasm of unspecified part of unspecified bronchus or lung: Secondary | ICD-10-CM | POA: Diagnosis not present

## 2020-05-19 DIAGNOSIS — R0602 Shortness of breath: Secondary | ICD-10-CM | POA: Diagnosis not present

## 2020-05-19 LAB — BASIC METABOLIC PANEL
Anion gap: 9 (ref 5–15)
BUN: 13 mg/dL (ref 8–23)
CO2: 24 mmol/L (ref 22–32)
Calcium: 9.1 mg/dL (ref 8.9–10.3)
Chloride: 104 mmol/L (ref 98–111)
Creatinine, Ser: 1.01 mg/dL (ref 0.61–1.24)
GFR, Estimated: >60 mL/min (ref >60)
Glucose, Bld: 95 mg/dL (ref 70–99)
Potassium: 3.8 mmol/L (ref 3.5–5.1)
Sodium: 137 mmol/L (ref 135–145)

## 2020-05-19 LAB — CBC
HCT: 28.9 % — ABNORMAL LOW (ref 39.0–52.0)
Hemoglobin: 9.5 g/dL — ABNORMAL LOW (ref 13.0–17.0)
MCH: 31 pg (ref 26.0–34.0)
MCHC: 32.9 g/dL (ref 30.0–36.0)
MCV: 94.4 fL (ref 80.0–100.0)
Platelets: 327 10*3/uL (ref 150–400)
RBC: 3.06 MIL/uL — ABNORMAL LOW (ref 4.22–5.81)
RDW: 13.5 % (ref 11.5–15.5)
WBC: 7.8 10*3/uL (ref 4.0–10.5)
nRBC: 0 % (ref 0.0–0.2)

## 2020-05-19 MED ORDER — ALBUTEROL SULFATE HFA 108 (90 BASE) MCG/ACT IN AERS: 2.0000 | INHALATION_SPRAY | RESPIRATORY_TRACT | Status: DC | PRN

## 2020-05-19 NOTE — ED Notes (Signed)
Patient given discharge paperwork and instructions. Verbalized understanding of teaching. No IV access. Ambulatory to exit in NAD with steady gait.

## 2020-05-19 NOTE — ED Provider Notes (Signed)
[facility] EMERGENCY DEPARTMENT Provider Note   CSN: [id number] Arrival date & time: 05/19/20  1427     History Chief Complaint  Patient presents with  . Shortness of Breath    Shaun Kennedy is a 80 y.o. male hx of DM, respiratory distress requiring intubation and now has a trach, here presenting with problems with his trach.  He states that for the last several days he has been coughing and has been having coughing with some phlegm. He states that he went to trach clinic yesterday and had his trach changed.  He states that since then he has more congestion denies any fevers.  Denies any Covid exposure.  Denies any chest pain.  He came to the ED about 2 weeks ago after her trach came out and was replaced by ENT.   The history is provided by the patient.       Past Medical History:  Diagnosis Date  . Allergic rhinitis   . Asthma   . Carotid stenosis   . Colon cancer ([place]) 2003  . Diabetes mellitus ([place])   . Diverticulosis   . Dyslipidemia   . ED (erectile dysfunction)   . GERD (gastroesophageal reflux disease)   . H/O degenerative disc disease   . Hemorrhoids   . HTN (hypertension)   . Hyperlipidemia   . Lung cancer ([place])   . LVH (left ventricular hypertrophy)    on EKG  . Mass of lower lobe of left lung   . Mediastinal adenopathy   . Smoker    former  . Wears dentures   . Wears dentures   . Wears glasses   . Wears glasses     Patient Active Problem List   Diagnosis Date Noted  . Trachea, stenosis   . Tracheostomy status ([place])   . Hypotension due to hypovolemia 04/20/2020  . Left lower lobe pneumonia 04/20/2020  . Pressure injury of skin 04/20/2020  . Acute renal failure (ARF) ([place]) 04/19/2020  . Pleuritic chest pain 03/17/2020  . Chemotherapy-induced neuropathy ([place]) 02/12/2020  . Vocal cord dysfunction 02/12/2020  . 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 Stridor 12/18/2019  . Abnormal findings on diagnostic imaging of lung 11/06/2019  .  Antineoplastic chemotherapy induced anemia 10/16/2019  . Shortness of breath 10/15/2019  . Odynophagia 09/25/2019  . Port-A-Cath in place 09/17/2019  . Decreased appetite 09/03/2019  . Malignant neoplasm of lower lobe of left lung ([place]) 08/28/2019  . Encounter for antineoplastic chemotherapy 08/24/2019  . Encounter for antineoplastic immunotherapy 08/24/2019  . Goals of care, counseling/discussion 08/24/2019  . Atherosclerosis of aorta ([place]) 04/09/2019  . Carpal tunnel syndrome, left upper limb 02/19/2019  . History of carpal tunnel surgery of right wrist 12/21/2018  . OAB (overactive bladder) 10/11/2017  . Diabetic nephropathy associated with type 2 diabetes mellitus ([place]) 10/11/2017  . Onychomycosis of multiple toenails with type 2 diabetes mellitus ([place]) 07/22/2015  . Former smoker, stopped smoking in distant past 07/22/2015  . Diabetes mellitus type II, non insulin dependent ([place]) 06/13/2011  . ED (erectile dysfunction) 06/13/2011  . History of prostate cancer 06/13/2011  . Hypertension associated with diabetes ([place]) 06/13/2011  . Hyperlipidemia associated with type 2 diabetes mellitus ([place]) 06/13/2011  . Allergic rhinitis, mild 06/13/2011  . History of asthma 06/13/2011  . Carotid stenosis 06/13/2011    Past Surgical History:  Procedure Laterality Date  . BRONCHIAL BIOPSY  08/20/2019   Procedure: BRONCHIAL BIOPSIES;  Surgeon: Collene Gobble, MD;  Location: [facility]COPY;  Service: Pulmonary;;  . BRONCHIAL BRUSHINGS  08/20/2019   Procedure: BRONCHIAL BRUSHINGS;  Surgeon: Collene Gobble, MD;  Location: [facility] ENDOSCOPY;  Service: Pulmonary;;  . BRONCHIAL NEEDLE ASPIRATION BIOPSY  08/20/2019   Procedure: BRONCHIAL NEEDLE ASPIRATION BIOPSIES;  Surgeon: Collene Gobble, MD;  Location: [facility] ENDOSCOPY;  Service: Pulmonary;;  . CARPAL TUNNEL RELEASE Right 01/09/2019   Procedure: CARPAL TUNNEL RELEASE;  Surgeon: Leandrew Koyanagi, MD;  Location: [facility];  Service: Orthopedics;   Laterality: Right;  . CATARACT EXTRACTION Right 2018  . COLONOSCOPY  2007   Dr. Benson Norway  . DIRECT LARYNGOSCOPY N/A 04/10/2020   Procedure: DIRECT LARYNGOSCOPY;  Surgeon: Melida Quitter, MD;  Location: [facility];  Service: ENT;  Laterality: N/A;  . I & D EXTREMITY Right 04/02/2016   Procedure: IRRIGATION AND DEBRIDEMENT GREAT TOE;  Surgeon: Leandrew Koyanagi, MD;  Location: [facility];  Service: Orthopedics;  Laterality: Right;  . IR IMAGING GUIDED PORT INSERTION  08/30/2019  . MULTIPLE TOOTH EXTRACTIONS    . RADIOACTIVE SEED IMPLANT  2003  . TRACHEOSTOMY TUBE PLACEMENT N/A 04/10/2020   Procedure: TRACHEOSTOMY;  Surgeon: Melida Quitter, MD;  Location: [facility];  Service: ENT;  Laterality: N/A;  . ULNAR TUNNEL RELEASE Right 01/09/2019   Procedure: RIGHT CUBITAL TUNNEL RELEASE AND CARPAL TUNNEL RELEASE;  Surgeon: Leandrew Koyanagi, MD;  Location: [facility];  Service: Orthopedics;  Laterality: Right;  . UPPER GASTROINTESTINAL ENDOSCOPY    . VIDEO BRONCHOSCOPY N/A 12/18/2019   Procedure: VIDEO BRONCHOSCOPY WITHOUT FLUORO;  Surgeon: Collene Gobble, MD;  Location: [facility];  Service: Cardiopulmonary;  Laterality: N/A;  . VIDEO BRONCHOSCOPY WITH ENDOBRONCHIAL ULTRASOUND N/A 08/20/2019   Procedure: VIDEO BRONCHOSCOPY WITH ENDOBRONCHIAL ULTRASOUND;  Surgeon: Collene Gobble, MD;  Location: [facility] ENDOSCOPY;  Service: Pulmonary;  Laterality: N/A;       Family History  Problem Relation Age of Onset  . Heart failure Mother   . Diabetes Mother   . Stroke Father   . Diabetes Father   . Diabetes Sister   . Diabetes Sister   . Lung cancer Sister 10  . Colon cancer Neg Hx   . Esophageal cancer Neg Hx   . Rectal cancer Neg Hx   . Colon polyps Neg Hx   . Stomach cancer Neg Hx     Social History   Tobacco Use  . Smoking status: Former Smoker    Packs/day: 1.00    Years: 32.00    Pack years: 32.00    Start date: 02/08/1955    Quit date: 10/09/1987    Years since quitting: 32.6  . Smokeless tobacco: Never Used   Vaping Use  . Vaping Use: Never used  Substance Use Topics  . Alcohol use: No  . Drug use: No    Home Medications Prior to Admission medications   Medication Sig Start Date End Date Taking? Authorizing Provider  acetaminophen (TYLENOL) 500 MG tablet Take 1,000 mg by mouth every 4 (four) hours as needed.    [provider]  amLODipine (NORVASC) 5 MG tablet Take 1 tablet (5 mg total) by mouth daily. 04/07/20   [name], MD  amoxicillin-clavulanate (AUGMENTIN) 875-125 MG tablet Take 1 tablet by mouth every 12 (twelve) hours. Patient not taking: Reported on 04/29/2020 04/23/20   Nita Sells, MD  ANORO ELLIPTA 62.5-25 MCG/INH AEPB Inhale 1 puff into the lungs daily. Patient not taking: Reported on 04/29/2020 01/29/20   [provider]  aspirin EC 81  MG tablet Take 81 mg by mouth daily after breakfast.     [provider]  Blood Glucose Monitoring Suppl (ONETOUCH VERIO) w/Device KIT USE TO CHECK SUGAR DAILY 02/21/17   [name], MD  ferrous sulfate 325 (65 FE) MG EC tablet Take 1 tablet (325 mg total) by mouth daily with breakfast. 04/07/20   [name], MD  HYDROcodone-acetaminophen (NORCO/VICODIN) 5-325 MG tablet Take 1-2 tablets by mouth every 6 (six) hours as needed for moderate pain. Patient not taking: No sig reported 04/15/20   Melida Quitter, MD  Lancets [facility] DELICA PLUS [id number]) [place] PATIENT IS TO TEST TWO TIMES A DAY DX: E11.9 (ONETOUCH VERIO FLEX) 04/23/19   [name], MD  metFORMIN (GLUCOPHAGE-XR) 500 MG 24 hr tablet Take 1 tablet (500 mg total) by mouth daily. 04/07/20   [name], MD  Multiple Vitamin (MULTIVITAMIN WITH MINERALS) TABS tablet Take 1 tablet by mouth daily after breakfast.     [provider]  polyethylene glycol (MIRALAX / GLYCOLAX) 17 g packet Take 17 g by mouth daily as needed for moderate constipation. Patient not taking: No sig reported 04/23/20   Nita Sells, MD  senna-docusate  (SENOKOT-S) 8.6-50 MG tablet Take 2 tablets by mouth 2 (two) times daily. Patient not taking: No sig reported 04/23/20   Nita Sells, MD  solifenacin (VESICARE) 5 MG tablet Take 1 tablet (5 mg total) by mouth daily. 05/12/20   [name], MD    Allergies    Patient has no known allergies.  Review of Systems   Review of Systems  Respiratory: Positive for cough and shortness of breath.   All other systems reviewed and are negative.   Physical Exam Updated Vital Signs BP (!) 145/76   Pulse 88   Temp 98.8 F (37.1 C) (Oral)   Resp (!) 24   Ht '5\' 7"'  (1.702 m)   Wt 76.7 kg   SpO2 100%   BMI 26.47 kg/m   Physical Exam Vitals and nursing note reviewed.  Constitutional:      Comments: Chronically ill-appearing but not cyanotic  HENT:     Head: Normocephalic.  Eyes:     Extraocular Movements: Extraocular movements intact.     Pupils: Pupils are equal, round, and reactive to light.  Neck:     Comments: Lurline Idol is in place and the trach is capped currently and he is not on ventilator.  There is no obvious stridor. Cardiovascular:     Rate and Rhythm: Normal rate and regular rhythm.  Pulmonary:     Comments: + wheezing bilaterally  Abdominal:     General: Bowel sounds are normal.     Palpations: Abdomen is soft.  Musculoskeletal:        General: Normal range of motion.  Skin:    General: Skin is warm.     Capillary Refill: Capillary refill takes less than 2 seconds.  Neurological:     General: No focal deficit present.     Mental Status: He is oriented to person, place, and time.  Psychiatric:        Mood and Affect: Mood normal.        Behavior: Behavior normal.     ED Results / Procedures / Treatments   Labs (all labs ordered are listed, but only abnormal results are displayed) Labs Reviewed  CBC - Abnormal; Notable for the following components:      Result Value   RBC 3.06 (*)  Hemoglobin 9.5 (*)    HCT 28.9 (*)    All other components within  normal limits  BASIC METABOLIC PANEL    EKG EKG Interpretation  Date/Time:  Tuesday May 19 2020 14:40:49 EDT Ventricular Rate:  100 PR Interval:  218 QRS Duration: 84 QT Interval:  344 QTC Calculation: 443 R Axis:   83 Text Interpretation: Sinus rhythm with 1st degree A-V block Otherwise normal ECG No significant change since last tracing Confirmed by Wandra Arthurs [phone]) on 05/19/2020 3:05:25 PM   Radiology DG Chest 2 View  Result Date: 05/19/2020 CLINICAL DATA:  Worsening shortness of breath for 1 day. History of left lung cancer treated with chemotherapy. Ex-smoker. EXAM: CHEST - 2 VIEW COMPARISON:  04/19/2020 and chest CT dated 04/19/2020 FINDINGS: Irregular, cavitary appearing mass-like area lateral to the upper aorta, corresponding to the patient's known malignancy. This is denser and more defined than seen on radiographs dated 04/19/2020 and more cavitary in appearance than seen on the CT. In the frontal projection, this measures approximate 6.6 x 4.5 cm. On the lateral view, this measures approximately 8.0 cm in AP diameter. Clear right lung. Tracheostomy in satisfactory position. Right jugular porta catheter tip in the superior vena cava. Unremarkable bones. IMPRESSION: Irregular, cavitary appearing mass-like area lateral to the upper aorta, corresponding to the patient's known malignancy. This is denser and more mass-like than seen radiographically on 04/19/2020 and more cavitary than seen on the CT on that day. Electronically Signed   By: Claudie Revering M.D.   On: 05/19/2020 15:27    Procedures Procedures   Medications Ordered in ED Medications  albuterol (VENTOLIN HFA) 108 (90 Base) MCG/ACT inhaler 2 puff (has no administration in time range)    ED Course  I have reviewed the triage vital signs and the nursing notes.  Pertinent labs & imaging results that were available during my care of the patient were reviewed by me and considered in my medical decision making (see  chart for details).    MDM Rules/Calculators/A&P                         Shaun Kennedy is a 80 y.o. male here with pain with his trach.  Lurline Idol was replaced that the clinic yesterday.  Will get x-ray to confirm placement.  I think he likely just have some secretions so we will have respiratory suction the trach.   5:29 PM Tracheal suction by the tech and he is feeling much better.  His chest x-ray showed possibly enlarging mass.  I initially ordered a chest CT but he does not want to wait.  He states that he has oncology follow-up tomorrow and would rather just see his oncologist.  He has no cough or fever to suggest pneumonia.  I told him to return if he has trouble breathing or trouble with his trach.  He has both ENT and pulmonology follow-up as well.   Final Clinical Impression(s) / ED Diagnoses Final diagnoses:  None    Rx / DC Orders ED Discharge Orders    None       Drenda Freeze, MD 05/19/20 1730

## 2020-05-19 NOTE — ED Triage Notes (Signed)
Patient has shortness of breath and trach. States been having worsening shortness of breath recently. Denies pain. States at times feels like when he cough the sputum stays near his trach.

## 2020-05-19 NOTE — ED Triage Notes (Signed)
Emergency Medicine Provider Triage Evaluation Note  Shaun Kennedy , a 80 y.o. male  was evaluated in triage.  Pt complains of persistent shortness of breath; however, patient notes he has developed a productive cough and is having difficulty cough up the phlegm. Cough associated with wheeze. He has a history of lung cancer. No fever or chills. No sick contacts. No chest pain. Patient is tracheostomy dependant. No secretions from tracheostomy.  Review of Systems  Positive: SOB, cough Negative: Fever/chills  Physical Exam  BP (!) 144/67 (BP Location: Left Arm)   Pulse 99   Temp 98.8 F (37.1 C) (Oral)   Resp 18   Ht 5\' 7"  (1.702 m)   Wt 76.7 kg   SpO2 99%   BMI 26.47 kg/m  Gen:   Awake, no distress   HEENT:  Atraumatic  Resp:  Wheeze heard throughout Cardiac:  Normal rate  Abd:   Nondistended, nontender  MSK:   Moves extremities without difficulty  Neuro:  Speech clear   Medical Decision Making  Medically screening exam initiated at 2:48 PM.  Appropriate orders placed.  [name] was informed that the remainder of the evaluation will be completed by another provider, this initial triage assessment does not replace that evaluation, and the importance of remaining in the ED until their evaluation is complete.  Clinical Impression  Shortness of breath with cough and wheeze. CXR, routine labs, and EKG ordered.    Suzy Bouchard, [place] 05/19/20 1451

## 2020-05-19 NOTE — Discharge Instructions (Signed)
Please suction your trach as needed  Use albuterol every 4 hours as needed for cough or congestion  You have a lung mass that needs to be followed up with your oncologist so please keep your appointment tomorrow.  You may need a CT chest for further assessment  Return to ER if you have shortness of breath, trouble breathing.

## 2020-05-20 ENCOUNTER — Inpatient Hospital Stay: Payer: Medicare HMO

## 2020-05-20 ENCOUNTER — Inpatient Hospital Stay: Payer: Medicare HMO | Admitting: Internal Medicine

## 2020-05-20 ENCOUNTER — Inpatient Hospital Stay: Payer: Medicare HMO | Attending: Internal Medicine

## 2020-05-20 ENCOUNTER — Encounter (HOSPITAL_BASED_OUTPATIENT_CLINIC_OR_DEPARTMENT_OTHER): Payer: Medicare HMO | Admitting: Physician Assistant

## 2020-05-20 VITALS — BP 119/69 | HR 83 | Temp 97.4°F | Resp 18 | Ht 67.0 in | Wt 170.5 lb

## 2020-05-20 DIAGNOSIS — I2510 Atherosclerotic heart disease of native coronary artery without angina pectoris: Secondary | ICD-10-CM | POA: Diagnosis not present

## 2020-05-20 DIAGNOSIS — Z5112 Encounter for antineoplastic immunotherapy: Secondary | ICD-10-CM | POA: Insufficient documentation

## 2020-05-20 DIAGNOSIS — C3432 Malignant neoplasm of lower lobe, left bronchus or lung: Secondary | ICD-10-CM

## 2020-05-20 DIAGNOSIS — C3490 Malignant neoplasm of unspecified part of unspecified bronchus or lung: Secondary | ICD-10-CM

## 2020-05-20 DIAGNOSIS — Z87891 Personal history of nicotine dependence: Secondary | ICD-10-CM | POA: Diagnosis not present

## 2020-05-20 DIAGNOSIS — Z7982 Long term (current) use of aspirin: Secondary | ICD-10-CM | POA: Diagnosis not present

## 2020-05-20 DIAGNOSIS — Z95828 Presence of other vascular implants and grafts: Secondary | ICD-10-CM

## 2020-05-20 DIAGNOSIS — Z79899 Other long term (current) drug therapy: Secondary | ICD-10-CM | POA: Diagnosis not present

## 2020-05-20 DIAGNOSIS — Z9221 Personal history of antineoplastic chemotherapy: Secondary | ICD-10-CM | POA: Insufficient documentation

## 2020-05-20 DIAGNOSIS — Z7984 Long term (current) use of oral hypoglycemic drugs: Secondary | ICD-10-CM | POA: Insufficient documentation

## 2020-05-20 DIAGNOSIS — Z7952 Long term (current) use of systemic steroids: Secondary | ICD-10-CM | POA: Diagnosis not present

## 2020-05-20 LAB — CMP (CANCER CENTER ONLY)
ALT: 19 U/L (ref 0–44)
AST: 18 U/L (ref 15–41)
Albumin: 3.4 g/dL — ABNORMAL LOW (ref 3.5–5.0)
Alkaline Phosphatase: 51 U/L (ref 38–126)
Anion gap: 12 (ref 5–15)
BUN: 16 mg/dL (ref 8–23)
CO2: 26 mmol/L (ref 22–32)
Calcium: 9.2 mg/dL (ref 8.9–10.3)
Chloride: 106 mmol/L (ref 98–111)
Creatinine: 1.1 mg/dL (ref 0.61–1.24)
GFR, Estimated: >60 mL/min (ref >60)
Glucose, Bld: 132 mg/dL — ABNORMAL HIGH (ref 70–99)
Potassium: 3.8 mmol/L (ref 3.5–5.1)
Sodium: 144 mmol/L (ref 135–145)
Total Bilirubin: 0.6 mg/dL (ref 0.3–1.2)
Total Protein: 7.6 g/dL (ref 6.5–8.1)

## 2020-05-20 LAB — CBC WITH DIFFERENTIAL (CANCER CENTER ONLY)
Abs Immature Granulocytes: 0.01 K/uL (ref 0.00–0.07)
Basophils Absolute: 0 K/uL (ref 0.0–0.1)
Basophils Relative: 0 %
Eosinophils Absolute: 0.1 K/uL (ref 0.0–0.5)
Eosinophils Relative: 3 %
HCT: 27.2 % — ABNORMAL LOW (ref 39.0–52.0)
Hemoglobin: 9.1 g/dL — ABNORMAL LOW (ref 13.0–17.0)
Immature Granulocytes: 0 %
Lymphocytes Relative: 23 %
Lymphs Abs: 1.1 K/uL (ref 0.7–4.0)
MCH: 31.1 pg (ref 26.0–34.0)
MCHC: 33.5 g/dL (ref 30.0–36.0)
MCV: 92.8 fL (ref 80.0–100.0)
Monocytes Absolute: 0.6 K/uL (ref 0.1–1.0)
Monocytes Relative: 12 %
Neutro Abs: 2.9 K/uL (ref 1.7–7.7)
Neutrophils Relative %: 62 %
Platelet Count: 288 K/uL (ref 150–400)
RBC: 2.93 MIL/uL — ABNORMAL LOW (ref 4.22–5.81)
RDW: 13.7 % (ref 11.5–15.5)
WBC Count: 4.7 K/uL (ref 4.0–10.5)
nRBC: 0 % (ref 0.0–0.2)

## 2020-05-20 LAB — TSH: TSH: 1.229 u[IU]/mL (ref 0.320–4.118)

## 2020-05-20 MED ORDER — SODIUM CHLORIDE 0.9% FLUSH
10.0000 mL | Freq: Once | INTRAVENOUS | Status: AC
  Administered 2020-05-20: 10 mL
  Filled 2020-05-20: qty 10

## 2020-05-20 MED ORDER — HEPARIN SOD (PORK) LOCK FLUSH 100 UNIT/ML IV SOLN
500.0000 [IU] | Freq: Once | INTRAVENOUS | Status: AC
  Administered 2020-05-20: 500 [IU]
  Filled 2020-05-20: qty 5

## 2020-05-20 MED ORDER — METHYLPREDNISOLONE 4 MG PO TBPK: ORAL_TABLET | ORAL | 0 refills | Status: DC

## 2020-05-20 NOTE — Patient Instructions (Signed)

## 2020-05-20 NOTE — Progress Notes (Signed)
[place] [phone]   [phone]  OFFICE PROGRESS NOTE  Shaun Kennedy, [name]  DIAGNOSIS: stage IV (T3, N2, M1 a) non-small cell Kennedy cancer, squamous cell carcinoma presented with obstructive left lower lobe Kennedy mass in addition to mediastinal lymphadenopathy as well as bilateral pulmonary nodules diagnosed in July 2021.  PDL1: 0%  PRIOR THERAPY: None  CURRENT THERAPY: 1) Palliative radiotherapy to the obstructive left lower lobe Kennedy mass under the care of Dr. Lisbeth Renshaw.  2)  systemic chemotherapy 2 cycles of chemotherapy with carboplatin for an AUC of 5 and paclitaxel 175 mg/m2 in addition to immunotherapy with nivolumab 360 mg every 3 weeks and ipilimumab 1 mg/kg IV every 6 weeks followed by maintenance nivolumab and ipilimumab.  He started the first treatment on 09/04/2019.  He is status post 6 cycles of the treatment.  INTERVAL HISTORY: Shaun Kennedy 80 y.o. male returns to the clinic today for follow-up visit accompanied by his daughter.  His other daughter was available by phone during the visit.  The patient is complaining of increasing fatigue and weakness as well as shortness of breath and intermittent left-sided chest pain.  He was seen at the emergency department yesterday and chest x-ray showed suspicious enlargement of the left upper lobe Kennedy mass with increased cavitation.  The patient denied having any current fever or chills.  He has no nausea, vomiting, diarrhea or constipation.  He has no headache or visual changes.  He was supposed to start cycle #7 of his treatment today.  MEDICAL HISTORY: Past Medical History:  Diagnosis Date  . Allergic rhinitis   . Asthma   . Carotid stenosis   . Colon cancer ([place]) 2003  . Diabetes mellitus ([place])   . Diverticulosis   . Dyslipidemia   . ED (erectile dysfunction)   . GERD (gastroesophageal reflux disease)   . H/O degenerative disc disease    . Hemorrhoids   . HTN (hypertension)   . Hyperlipidemia   . Kennedy cancer ([place])   . LVH (left ventricular hypertrophy)    on EKG  . Mass of lower lobe of left Kennedy   . Mediastinal adenopathy   . Smoker    former  . Wears dentures   . Wears dentures   . Wears glasses   . Wears glasses     ALLERGIES:  has No Known Allergies.  MEDICATIONS:  Current Outpatient Medications  Medication Sig Dispense Refill  . acetaminophen (TYLENOL) 500 MG tablet Take 1,000 mg by mouth every 4 (four) hours as needed.    Marland Kitchen amLODipine (NORVASC) 5 MG tablet Take 1 tablet (5 mg total) by mouth daily. 90 tablet 3  . amoxicillin-clavulanate (AUGMENTIN) 875-125 MG tablet Take 1 tablet by mouth every 12 (twelve) hours. (Patient not taking: Reported on 04/29/2020) 8 tablet 0  . ANORO ELLIPTA 62.5-25 MCG/INH AEPB Inhale 1 puff into the lungs daily. (Patient not taking: Reported on 04/29/2020)    . aspirin EC 81 MG tablet Take 81 mg by mouth daily after breakfast.     . Blood Glucose Monitoring Suppl (ONETOUCH VERIO) w/Device KIT USE TO CHECK SUGAR DAILY 1 kit 0  . ferrous sulfate 325 (65 FE) MG EC tablet Take 1 tablet (325 mg total) by mouth daily with breakfast. 30 tablet 12  . HYDROcodone-acetaminophen (NORCO/VICODIN) 5-325 MG tablet Take 1-2 tablets by mouth every 6 (six) hours as needed for moderate pain. (Patient not taking:  No sig reported) 20 tablet 0  . Lancets (ONETOUCH DELICA PLUS [id number]) MISC PATIENT IS TO TEST TWO TIMES A DAY DX: E11.9 (ONETOUCH VERIO FLEX) 100 each 12  . metFORMIN (GLUCOPHAGE-XR) 500 MG 24 hr tablet Take 1 tablet (500 mg total) by mouth daily. 90 tablet 1  . Multiple Vitamin (MULTIVITAMIN WITH MINERALS) TABS tablet Take 1 tablet by mouth daily after breakfast.     . polyethylene glycol (MIRALAX / GLYCOLAX) 17 g packet Take 17 g by mouth daily as needed for moderate constipation. (Patient not taking: No sig reported) 14 each 0  . senna-docusate (SENOKOT-S) 8.6-50 MG tablet Take 2 tablets  by mouth 2 (two) times daily. (Patient not taking: No sig reported) 30 tablet 0  . solifenacin (VESICARE) 5 MG tablet Take 1 tablet (5 mg total) by mouth daily. 90 tablet 1   No current facility-administered medications for this visit.   Facility-Administered Medications Ordered in Other Visits  Medication Dose Route Frequency Provider Last Rate Last Admin  . sodium chloride flush (NS) 0.9 % injection 10 mL  10 mL Intracatheter PRN Curt Bears, MD   10 mL at 09/04/19 1556    SURGICAL HISTORY:  Past Surgical History:  Procedure Laterality Date  . BRONCHIAL BIOPSY  08/20/2019   Procedure: BRONCHIAL BIOPSIES;  Surgeon: Collene Gobble, MD;  Location: [facility] ENDOSCOPY;  Service: Pulmonary;;  . BRONCHIAL BRUSHINGS  08/20/2019   Procedure: BRONCHIAL BRUSHINGS;  Surgeon: Collene Gobble, MD;  [facility] END[facility]COPY;  Service: Pulmonary;;  . BRONCHIAL NEEDLE ASPIRATION BIOPSY  08/20/2019   Procedure: BRONCHIAL NEEDLE ASPIRATION BIOPSIES;  Surgeon: Collene Gobble, MD;  Location: [facility] ENDOSCOPY;  Service: Pulmonary;;  . CARPAL TUNNEL RELEASE Right 01/09/2019   Procedure: CARPAL TUNNEL RELEASE;  Surgeon: Leandrew Koyanagi, MD;  Location: [place];  Service: Orthopedics;  Laterality: Right;  . CATARACT EXTRACTION Right 2018  . COLONOSCOPY  2007   Dr. Benson Norway  . DIRECT LARYNGOSCOPY N/A 04/10/2020   Procedure: DIRECT LARYNGOSCOPY;  Surgeon: Melida Quitter, MD;  Location: [place];  Service: ENT;  Laterality: N/A;  . I & D EXTREMITY Right 04/02/2016   Procedure: IRRIGATION AND DEBRIDEMENT GREAT TOE;  Surgeon: Leandrew Koyanagi, MD;  Location: [place];  Service: Orthopedics;  Laterality: Right;  . IR IMAGING GUIDED PORT INSERTION  08/30/2019  . MULTIPLE TOOTH EXTRACTIONS    . RADIOACTIVE SEED IMPLANT  2003  . TRACHEOSTOMY TUBE PLACEMENT N/A 04/10/2020   Procedure: TRACHEOSTOMY;  Surgeon: Melida Quitter, MD;  Location: [place];  Service: ENT;  Laterality: N/A;  . ULNAR TUNNEL RELEASE Right 01/09/2019   Procedure:  RIGHT CUBITAL TUNNEL RELEASE AND CARPAL TUNNEL RELEASE;  Surgeon: Leandrew Koyanagi, MD;  Location: [place];  Service: Orthopedics;  Laterality: Right;  . UPPER GASTROINTESTINAL ENDOSCOPY    . VIDEO BRONCHOSCOPY N/A 12/18/2019   Procedure: VIDEO BRONCHOSCOPY WITHOUT FLUORO;  Surgeon: Collene Gobble, MD;  Location: [facility] ENDOSCOPY;  Service: Cardiopulmonary;  Laterality: N/A;  . VIDEO BRONCHOSCOPY WITH ENDOBRONCHIAL ULTRASOUND N/A 08/20/2019   Procedure: VIDEO BRONCHOSCOPY WITH ENDOBRONCHIAL ULTRASOUND;  Surgeon: Collene Gobble, MD;  Location: [facility] ENDOSCOPY;  Service: Pulmonary;  Laterality: N/A;    REVIEW OF SYSTEMS:  Constitutional: positive for fatigue Eyes: negative Ears, nose, mouth, throat, and face: negative Respiratory: positive for dyspnea on exertion and pleurisy/chest pain Cardiovascular: negative Gastrointestinal: negative Genitourinary:negative Integument/breast: negative Hematologic/lymphatic: negative Musculoskeletal:negative Neurological: negative Behavioral/Psych: negative Endocrine: negative Allergic/Immunologic: negative   PHYSICAL EXAMINATION: General appearance:  alert, cooperative, fatigued and no distress Head: Normocephalic, without obvious abnormality, atraumatic Neck: no adenopathy, no JVD, supple, symmetrical, trachea midline and thyroid not enlarged, symmetric, no tenderness/mass/nodules Lymph nodes: Cervical, supraclavicular, and axillary nodes normal. Resp: rales LUL Back: symmetric, no curvature. ROM normal. No CVA tenderness. Cardio: regular rate and rhythm, S1, S2 normal, no murmur, click, rub or gallop GI: soft, non-tender; bowel sounds normal; no masses,  no organomegaly Extremities: extremities normal, atraumatic, no cyanosis or edema Neurologic: Alert and oriented X 3, normal strength and tone. Normal symmetric reflexes. Normal coordination and gait  ECOG PERFORMANCE STATUS: 1 - Symptomatic but completely ambulatory  Blood pressure  119/69, pulse 83, temperature (!) 97.4 F (36.3 C), temperature source Tympanic, resp. rate 18, height '5\' 7"'  (1.702 m), weight 170 lb 8 oz (77.3 kg), SpO2 99 %.  LABORATORY DATA: Lab Results  Component Value Date   WBC 4.7 05/20/2020   HGB 9.1 (L) 05/20/2020   HCT 27.2 (L) 05/20/2020   MCV 92.8 05/20/2020   PLT 288 05/20/2020      Chemistry      Component Value Date/Time   NA 137 05/19/2020 1442   NA 132 (L) 08/14/2019 0918   K 3.8 05/19/2020 1442   CL 104 05/19/2020 1442   CO2 24 05/19/2020 1442   BUN 13 05/19/2020 1442   BUN 16 08/14/2019 0918   CREATININE 1.01 05/19/2020 1442   CREATININE 1.10 04/29/2020 0805   CREATININE 1.25 (H) 08/08/2016 0912      Component Value Date/Time   CALCIUM 9.1 05/19/2020 1442   ALKPHOS 51 04/29/2020 0805   AST 19 04/29/2020 0805   ALT 22 04/29/2020 0805   BILITOT 0.4 04/29/2020 0805       RADIOGRAPHIC STUDIES: DG Chest 2 View  Result Date: 05/19/2020 CLINICAL DATA:  Worsening shortness of breath for 1 day. History of left Kennedy cancer treated with chemotherapy. Ex-smoker. EXAM: CHEST - 2 VIEW COMPARISON:  04/19/2020 and chest CT dated 04/19/2020 FINDINGS: Irregular, cavitary appearing mass-like area lateral to the upper aorta, corresponding to the patient's known malignancy. This is denser and more defined than seen on radiographs dated 04/19/2020 and more cavitary in appearance than seen on the CT. In the frontal projection, this measures approximate 6.6 x 4.5 cm. On the lateral view, this measures approximately 8.0 cm in AP diameter. Clear right Kennedy. Tracheostomy in satisfactory position. Right jugular porta catheter tip in the superior vena cava. Unremarkable bones. IMPRESSION: Irregular, cavitary appearing mass-like area lateral to the upper aorta, corresponding to the patient's known malignancy. This is denser and more mass-like than seen radiographically on 04/19/2020 and more cavitary than seen on the CT on that day. Electronically  Signed   By: Claudie Revering M.D.   On: 05/19/2020 15:27   US RENAL  Result Date: 04/20/2020 CLINICAL DATA:  80 year old male with acute renal insufficiency. Kennedy cancer. EXAM: RENAL / URINARY TRACT ULTRASOUND COMPLETE COMPARISON:  CT Chest, Abdomen, and Pelvis 03/06/2020. chest CT without contrast 04/19/2020. FINDINGS: Right Kidney: Renal measurements: 10.0 x 3.3 x 5.0 cm = volume: 85 mL. Renal cortex echogenicity appears increased on images 3, 8. There is a solitary small benign upper pole parapelvic cyst redemonstrated. No hydronephrosis or solid right renal mass. Left Kidney: Renal measurements: 9.7 x 6.1 x 5.8 cm = volume: 179 mL. Left renal cortical echogenicity appears better preserved (image 17). No left hydronephrosis or renal lesion. Bladder: Decompressed, Foley catheter balloon visible. Other: None. IMPRESSION: 1. No acute renal finding.  Bladder decompressed by Foley. 2. Decreased right renal size and increased cortical echogenicity suggesting sequelae of chronic medical renal disease. Left kidney appears more normal for age. Electronically Signed   By: Genevie Ann M.D.   On: 04/20/2020 10:55   DG Swallowing Func-Speech Pathology  Result Date: 04/20/2020 Objective Swallowing Evaluation: Type of Study: MBS-Modified Barium Swallow Study  Patient Details Name: BHARGAV BARBARO MRN: [id number] Date of Birth: 04-Sep-1940 Today's Date: 04/20/2020 Time: SLP Start Time (ACUTE ONLY): 1300 -SLP Stop Time (ACUTE ONLY): 1315 SLP Time Calculation (min) (ACUTE ONLY): 15 min Past Medical History: Past Medical History: Diagnosis Date . Allergic rhinitis  . Asthma  . Carotid stenosis  . Colon cancer ([place]) 2003 . Diabetes mellitus ([place])  . Diverticulosis  . Dyslipidemia  . ED (erectile dysfunction)  . GERD (gastroesophageal reflux disease)  . H/O degenerative disc disease  . Hemorrhoids  . HTN (hypertension)  . Hyperlipidemia  . Kennedy cancer ([place])  . LVH (left ventricular hypertrophy)   on EKG . Mass of lower lobe of left Kennedy   . Mediastinal adenopathy  . Smoker   former . Wears dentures  . Wears dentures  . Wears glasses  . Wears glasses  Past Surgical History: Past Surgical History: Procedure Laterality Date . BRONCHIAL BIOPSY  08/20/2019  Procedure: BRONCHIAL BIOPSIES;  Surgeon: Collene Gobble, MD;  Location: [facility] ENDOSCOPY;  Service: Pulmonary;; . BRONCHIAL BRUSHINGS  08/20/2019  Procedure: BRONCHIAL BRUSHINGS;  Surgeon: Collene Gobble, MD;  Location: [facility] ENDOSCOPY;  Service: Pulmonary;; . BRONCHIAL NEEDLE ASPIRATION BIOPSY  08/20/2019  Procedure: BRONCHIAL NEEDLE ASPIRATION BIOPSIES;  Surgeon: Collene Gobble, MD;  Location[facility] ENDOSCOPY;  Service: Pulmonary;; . CARPAL TUNNEL RELEASE Right 01/09/2019  Procedure: CARPAL TUNNEL RELEASE;  Surgeon: Leandrew Koyanagi, MD;  Location: [place];  Service: Orthopedics;  Laterality: Right; . CATARACT EXTRACTION Right 2018 . COLONOSCOPY  2007  Dr. Benson Norway . DIRECT LARYNGOSCOPY N/A 04/10/2020  Procedure: DIRECT LARYNGOSCOPY;  Surgeon: Melida Quitter, MD;  Location: [place];  Service: ENT;  Laterality: N/A; . I & D EXTREMITY Right 04/02/2016  Procedure: IRRIGATION AND DEBRIDEMENT GREAT TOE;  Surgeon: Leandrew Koyanagi, MD;  Location: [place];  Service: Orthopedics;  Laterality: Right; . IR IMAGING GUIDED PORT INSERTION  08/30/2019 . MULTIPLE TOOTH EXTRACTIONS   . RADIOACTIVE SEED IMPLANT  2003 . TRACHEOSTOMY TUBE PLACEMENT N/A 04/10/2020  Procedure: TRACHEOSTOMY;  Surgeon: Melida Quitter, MD;  Location: [place];  Service: ENT;  Laterality: N/A; . ULNAR TUNNEL RELEASE Right 01/09/2019  Procedure: RIGHT CUBITAL TUNNEL RELEASE AND CARPAL TUNNEL RELEASE;  Surgeon: Leandrew Koyanagi, MD;  Location: [place];  Service: Orthopedics;  Laterality: Right; . UPPER GASTROINTESTINAL ENDOSCOPY   . VIDEO BRONCHOSCOPY N/A 12/18/2019  Procedure: VIDEO BRONCHOSCOPY WITHOUT FLUORO;  Surgeon: Collene Gobble, MD;  Location: [facility] ENDOSCOPY;  Service: Cardiopulmonary;  Laterality: N/A; . VIDEO BRONCHOSCOPY WITH  ENDOBRONCHIAL ULTRASOUND N/A 08/20/2019  Procedure: VIDEO BRONCHOSCOPY WITH ENDOBRONCHIAL ULTRASOUND;  Surgeon: Collene Gobble, MD;  Location: [facility] ENDOSCOPY;  Service: Pulmonary;  Laterality: N/A; HPI: 80 year old male with glottic stenosis and inspiratory stridor following a remote history of Kennedy cancer (squamous cell carcinoma  with obstructive left lower lobe Kennedy mass in addition to mediastinal lymphadenopathy as well as bilateral pulmonary nodules diagnosed in July 2021 with chemo and radiation) who was recently admitted on 04/10/20 for an elective tracheostomy.  He was discharged home with a [place] cuffless trach  on 3/12. He was able  to tolerate regular solids and thin liquids and wear his PMSV all waking hours without difficulty. He did not report any pain on the day of d/c. He went home and started having pain at trach site particularly when swallowing. He drank thin liquids and applesauce but refused most foods at home. Pt readmitted with dehydration.  Subjective: pain Assessment / Plan / Recommendation CHL IP CLINICAL IMPRESSIONS 04/20/2020 Clinical Impression Pt demonstrates severe odynophagia which makes him hesitate to swallow and at times he avoids swallowing by orally holding the bolus. This behavior leads to instances of poor timing with sensed aspiration before the swallow and also oral residue without swallow initaition to transit. When pt coughs to expectorate trace penetration pain is severe. There is no sign of weakness or primary pharyngeal impairment. There is a possiblity of mild edema in the area of the upper esophagus that impedes bolus flow. Largely it seems that laryngeal elevation for swallow pulls at trach site and makes swallowing severely painful. Attempted to view cervical/thoracic esophagus and trach site under fluoroscopy to see if any fistula could be identified,  but shadow of pts shoulder obscured the view. Recommend f/u with ENT. Pt to remain NPO until pain is better controlled.  Pt may have ice in the meantime if he desires it. SLP Visit Diagnosis Dysphagia, oropharyngeal phase (R13.12) Attention and concentration deficit following -- Frontal lobe and executive function deficit following -- Impact on safety and function Risk for inadequate nutrition/hydration;Mild aspiration risk   CHL IP TREATMENT RECOMMENDATION 04/20/2020 Treatment Recommendations Therapy as outlined in treatment plan below   Prognosis 04/20/2020 Prognosis for Safe Diet Advancement Good Barriers to Reach Goals -- Barriers/Prognosis Comment -- No flowsheet data found.  CHL IP OTHER RECOMMENDATIONS 04/20/2020 Recommended Consults Consider ENT evaluation Oral Care Recommendations Oral care QID Other Recommendations Place PMSV during PO intake;Have oral suction available   CHL IP FOLLOW UP RECOMMENDATIONS 04/20/2020 Follow up Recommendations None   CHL IP FREQUENCY AND DURATION 04/20/2020 Speech Therapy Frequency (ACUTE ONLY) min 2x/week Treatment Duration 2 weeks      CHL IP ORAL PHASE 04/20/2020 Oral Phase Impaired Oral - Pudding Teaspoon -- Oral - Pudding Cup -- Oral - Honey Teaspoon -- Oral - Honey Cup -- Oral - Nectar Teaspoon -- Oral - Nectar Cup -- Oral - Nectar Straw -- Oral - Thin Teaspoon -- Oral - Thin Cup Decreased bolus cohesion;Lingual/palatal residue Oral - Thin Straw Lingual/palatal residue;Decreased bolus cohesion Oral - Puree -- Oral - Mech Soft -- Oral - Regular -- Oral - Multi-Consistency -- Oral - Pill -- Oral Phase - Comment --  CHL IP PHARYNGEAL PHASE 04/20/2020 Pharyngeal Phase Impaired Pharyngeal- Pudding Teaspoon -- Pharyngeal -- Pharyngeal- Pudding Cup -- Pharyngeal -- Pharyngeal- Honey Teaspoon -- Pharyngeal -- Pharyngeal- Honey Cup -- Pharyngeal -- Pharyngeal- Nectar Teaspoon -- Pharyngeal -- Pharyngeal- Nectar Cup -- Pharyngeal -- Pharyngeal- Nectar Straw -- Pharyngeal -- Pharyngeal- Thin Teaspoon -- Pharyngeal -- Pharyngeal- Thin Cup Penetration/Aspiration before swallow;Penetration/Apiration after  swallow Pharyngeal Material enters airway, remains ABOVE vocal cords and not ejected out;Material enters airway, remains ABOVE vocal cords then ejected out;Material does not enter airway Pharyngeal- Thin Straw Penetration/Aspiration before swallow;Trace aspiration Pharyngeal Material enters airway, passes BELOW cords then ejected out Pharyngeal- Puree WFL Pharyngeal -- Pharyngeal- Mechanical Soft -- Pharyngeal -- Pharyngeal- Regular -- Pharyngeal -- Pharyngeal- Multi-consistency -- Pharyngeal -- Pharyngeal- Pill -- Pharyngeal -- Pharyngeal Comment --  No flowsheet data found. Herbie Baltimore, MA [place] [facility] Pager [phone] Office [phone] [place], [place] Latino  Chrys Racer 04/20/2020, 2:33 PM               ASSESSMENT AND PLAN: This is a very pleasant 80 years old [name] with stage IV non-small cell Kennedy cancer, squamous cell carcinoma with negative PD-L1 expression. The patient is currently undergoing systemic chemotherapy with carboplatin and paclitaxel for 2 cycles in addition to immunotherapy with ipilimumab 1 mg/KG every 6 weeks and nivolumab 360 mg IV every 3 weeks status post 6 cycles.  The patient has been tolerating this treatment well with no concerning adverse effects.  He has tracheostomy performed recently for upper respiratory distress. His not feeling well today with chest pain as well as shortness of breath.  Chest x-ray at the emergency department showed increase in the left Kennedy mass. I recommended for the patient to hold his treatment until we repeat CT scan of the chest for further evaluation of his disease. I will see him back for follow-up visit in 1 week for reevaluation before resuming his treatment if there is no concerning findings for disease progression on the scan. For the shortness of breath and chest pain, I will start the patient on Medrol Dosepak. He was advised to call immediately if he has any other concerning symptoms in the  interval. The patient voices understanding of current disease status and treatment options and is in agreement with the current care plan.  All questions were answered. The patient knows to call the clinic with any problems, questions or concerns. We can certainly see the patient much sooner if necessary.  Disclaimer: This note was dictated with voice recognition software. Similar sounding words can inadvertently be transcribed and may not be corrected upon review.

## 2020-05-21 ENCOUNTER — Encounter: Payer: Self-pay | Admitting: Internal Medicine

## 2020-05-22 ENCOUNTER — Telehealth: Payer: Self-pay | Admitting: Internal Medicine

## 2020-05-22 ENCOUNTER — Telehealth: Payer: Self-pay

## 2020-05-22 NOTE — Telephone Encounter (Signed)
Per Dr. Julien Nordmann last OV note, he wants this pt to have a CT scan within the next week. We were able to get the CT authorized by his insurance and pt has been scheduled at [facility] on Monday 05/25/20 at 2pm. I have called the pt and spoke with his wife and advised of this. She was also advised NPO four hours prior to this exam. She expressed derstanding of this information.

## 2020-05-22 NOTE — Telephone Encounter (Signed)
R/s 4/21 appt due to provider schedule adjustments. Called and left msg.

## 2020-05-25 ENCOUNTER — Ambulatory Visit
Admission: RE | Admit: 2020-05-25 | Discharge: 2020-05-25 | Disposition: A | Discharge status: Home / Self Care | Payer: Medicare HMO | Source: Ambulatory Visit | Attending: Gastroenterology | Admitting: Gastroenterology

## 2020-05-25 ENCOUNTER — Other Ambulatory Visit: Payer: Self-pay

## 2020-05-25 DIAGNOSIS — J8410 Pulmonary fibrosis, unspecified: Secondary | ICD-10-CM | POA: Diagnosis not present

## 2020-05-25 DIAGNOSIS — R634 Abnormal weight loss: Secondary | ICD-10-CM | POA: Diagnosis not present

## 2020-05-25 DIAGNOSIS — Z85118 Personal history of other malignant neoplasm of bronchus and lung: Secondary | ICD-10-CM | POA: Diagnosis not present

## 2020-05-25 DIAGNOSIS — I2510 Atherosclerotic heart disease of native coronary artery without angina pectoris: Secondary | ICD-10-CM | POA: Diagnosis not present

## 2020-05-25 MED ORDER — IOPAMIDOL (ISOVUE-300) INJECTION 61%
75.0000 mL | Freq: Once | INTRAVENOUS | Status: AC | PRN
  Administered 2020-05-25: 75 mL via INTRAVENOUS

## 2020-05-26 ENCOUNTER — Ambulatory Visit (HOSPITAL_COMMUNITY): Payer: Medicare HMO

## 2020-05-27 ENCOUNTER — Telehealth: Payer: Self-pay | Admitting: Medical Oncology

## 2020-05-27 ENCOUNTER — Emergency Department (HOSPITAL_COMMUNITY)
Admission: EM | Admit: 2020-05-27 | Discharge: 2020-05-27 | Disposition: A | Discharge status: Home / Self Care | Payer: Medicare HMO | Attending: Emergency Medicine | Admitting: Emergency Medicine

## 2020-05-27 ENCOUNTER — Emergency Department (HOSPITAL_COMMUNITY): Payer: Medicare HMO

## 2020-05-27 ENCOUNTER — Telehealth: Payer: Self-pay | Admitting: Internal Medicine

## 2020-05-27 ENCOUNTER — Other Ambulatory Visit: Payer: Self-pay

## 2020-05-27 DIAGNOSIS — Z87891 Personal history of nicotine dependence: Secondary | ICD-10-CM | POA: Insufficient documentation

## 2020-05-27 DIAGNOSIS — J45909 Unspecified asthma, uncomplicated: Secondary | ICD-10-CM | POA: Diagnosis not present

## 2020-05-27 DIAGNOSIS — E119 Type 2 diabetes mellitus without complications: Secondary | ICD-10-CM | POA: Diagnosis not present

## 2020-05-27 DIAGNOSIS — Z7984 Long term (current) use of oral hypoglycemic drugs: Secondary | ICD-10-CM | POA: Diagnosis not present

## 2020-05-27 DIAGNOSIS — Z85038 Personal history of other malignant neoplasm of large intestine: Secondary | ICD-10-CM | POA: Insufficient documentation

## 2020-05-27 DIAGNOSIS — Z8546 Personal history of malignant neoplasm of prostate: Secondary | ICD-10-CM | POA: Diagnosis not present

## 2020-05-27 DIAGNOSIS — I10 Essential (primary) hypertension: Secondary | ICD-10-CM | POA: Insufficient documentation

## 2020-05-27 DIAGNOSIS — Z79899 Other long term (current) drug therapy: Secondary | ICD-10-CM | POA: Insufficient documentation

## 2020-05-27 DIAGNOSIS — Z430 Encounter for attention to tracheostomy: Secondary | ICD-10-CM

## 2020-05-27 DIAGNOSIS — J40 Bronchitis, not specified as acute or chronic: Secondary | ICD-10-CM

## 2020-05-27 DIAGNOSIS — Z7982 Long term (current) use of aspirin: Secondary | ICD-10-CM | POA: Insufficient documentation

## 2020-05-27 DIAGNOSIS — Z20822 Contact with and (suspected) exposure to covid-19: Secondary | ICD-10-CM | POA: Diagnosis not present

## 2020-05-27 DIAGNOSIS — C3492 Malignant neoplasm of unspecified part of left bronchus or lung: Secondary | ICD-10-CM

## 2020-05-27 DIAGNOSIS — R0602 Shortness of breath: Secondary | ICD-10-CM | POA: Diagnosis not present

## 2020-05-27 DIAGNOSIS — C3490 Malignant neoplasm of unspecified part of unspecified bronchus or lung: Secondary | ICD-10-CM | POA: Diagnosis not present

## 2020-05-27 LAB — RESP PANEL BY RT-PCR (FLU A&B, COVID) ARPGX2
Influenza A by PCR: NEGATIVE
Influenza B by PCR: NEGATIVE
SARS Coronavirus 2 by RT PCR: NEGATIVE

## 2020-05-27 LAB — COMPREHENSIVE METABOLIC PANEL
ALT: 15 U/L (ref 0–44)
AST: 16 U/L (ref 15–41)
Albumin: 3.4 g/dL — ABNORMAL LOW (ref 3.5–5.0)
Alkaline Phosphatase: 47 U/L (ref 38–126)
Anion gap: 8 (ref 5–15)
BUN: 21 mg/dL (ref 8–23)
CO2: 30 mmol/L (ref 22–32)
Calcium: 9.2 mg/dL (ref 8.9–10.3)
Chloride: 102 mmol/L (ref 98–111)
Creatinine, Ser: 1.22 mg/dL (ref 0.61–1.24)
GFR, Estimated: 60 mL/min — ABNORMAL LOW (ref >60)
Glucose, Bld: 113 mg/dL — ABNORMAL HIGH (ref 70–99)
Potassium: 3.9 mmol/L (ref 3.5–5.1)
Sodium: 140 mmol/L (ref 135–145)
Total Bilirubin: 0.8 mg/dL (ref 0.3–1.2)
Total Protein: 7.5 g/dL (ref 6.5–8.1)

## 2020-05-27 LAB — CBC WITH DIFFERENTIAL/PLATELET
Abs Immature Granulocytes: 0.01 10*3/uL (ref 0.00–0.07)
Basophils Absolute: 0 10*3/uL (ref 0.0–0.1)
Basophils Relative: 0 %
Eosinophils Absolute: 0.2 10*3/uL (ref 0.0–0.5)
Eosinophils Relative: 3 %
HCT: 29.2 % — ABNORMAL LOW (ref 39.0–52.0)
Hemoglobin: 9.4 g/dL — ABNORMAL LOW (ref 13.0–17.0)
Immature Granulocytes: 0 %
Lymphocytes Relative: 18 %
Lymphs Abs: 1.3 10*3/uL (ref 0.7–4.0)
MCH: 31.3 pg (ref 26.0–34.0)
MCHC: 32.2 g/dL (ref 30.0–36.0)
MCV: 97.3 fL (ref 80.0–100.0)
Monocytes Absolute: 1 10*3/uL (ref 0.1–1.0)
Monocytes Relative: 14 %
Neutro Abs: 4.7 10*3/uL (ref 1.7–7.7)
Neutrophils Relative %: 65 %
Platelets: 318 10*3/uL (ref 150–400)
RBC: 3 MIL/uL — ABNORMAL LOW (ref 4.22–5.81)
RDW: 13.9 % (ref 11.5–15.5)
WBC: 7.3 10*3/uL (ref 4.0–10.5)
nRBC: 0 % (ref 0.0–0.2)

## 2020-05-27 LAB — LACTIC ACID, PLASMA
Lactic Acid, Venous: 1 mmol/L (ref 0.5–1.9)
Lactic Acid, Venous: 0.9 mmol/L (ref 0.5–1.9)

## 2020-05-27 MED ORDER — HYDROCODONE-ACETAMINOPHEN 5-325 MG PO TABS: 1.0000 | ORAL_TABLET | ORAL | 0 refills | Status: DC | PRN

## 2020-05-27 MED ORDER — DOXYCYCLINE HYCLATE 100 MG PO TABS
100.0000 mg | ORAL_TABLET | Freq: Once | ORAL | Status: AC
  Administered 2020-05-27: 100 mg via ORAL
  Filled 2020-05-27: qty 1

## 2020-05-27 MED ORDER — DOXYCYCLINE HYCLATE 100 MG PO CAPS: 100.0000 mg | ORAL_CAPSULE | Freq: Two times a day (BID) | ORAL | 0 refills | Status: DC

## 2020-05-27 MED ORDER — HYDROCODONE-ACETAMINOPHEN 5-325 MG PO TABS
1.0000 | ORAL_TABLET | Freq: Once | ORAL | Status: AC
  Administered 2020-05-27: 1 via ORAL
  Filled 2020-05-27: qty 1

## 2020-05-27 NOTE — Progress Notes (Signed)
Rt called to room WA22 for trach change. RT placed a number #6 form #4 cuffless. Pt has equal BS, good color change on ezcap, vitals normal .

## 2020-05-27 NOTE — ED Triage Notes (Signed)
Emergency Medicine Provider Triage Evaluation Note  Shaun Kennedy , a 80 y.o. male  was evaluated in triage.  Pt complains of sob and trach problem.  Review of Systems  Positive: Sob, trach problem Negative: vomiting  Physical Exam  BP 116/69 (BP Location: Left Arm)   Pulse 86   Temp 98.5 F (36.9 C) (Oral)   Resp (!) 22   SpO2 97%  Gen:   Awake, no distress   HEENT:  Atraumatic  Resp:  Normal effort  Cardiac:  Normal rate  Abd:   Nondistended MSK:   Moves extremities without difficulty  Neuro:  Speech clear   Medical Decision Making  Medically screening exam initiated at 4:57 PM.  Appropriate orders placed.  Shaun Kennedy was informed that the remainder of the evaluation will be completed by another provider, this initial triage assessment does not replace that evaluation, and the importance of remaining in the ED until their evaluation is complete.  Clinical Impression   MSE was initiated and I personally evaluated the patient and placed orders (if any) at  4:57 PM on May 27, 2020.  The patient appears stable so that the remainder of the MSE may be completed by another provider.    Rodney Booze, [place] 05/27/20 1657

## 2020-05-27 NOTE — ED Provider Notes (Signed)
[facility] DEPT Provider Note   CSN: [id number] Arrival date & time: 05/27/20  1627     History No chief complaint on file.   Shaun Kennedy is a 80 y.o. male.  Pt presents to the ED today with sob.  Pt has a hx of lung cancer and a trach.  Today, pt has had some increased secretions and a possible fever (99).  EMS was called to his house.  They were unable to suction his trach, so they took the old one (7.5) out and placed a new one (6.5).  They did not have anything larger.  EMS called his oncologist's office and they wanted pt to come to the ED and to get a bigger trach placed and eval due to the fever.        Past Medical History:  Diagnosis Date  . Allergic rhinitis   . Asthma   . Carotid stenosis   . Colon cancer ([employer]) 2003  . Diabetes mellitus ([place])   . Diverticulosis   . Dyslipidemia   . ED (erectile dysfunction)   . GERD (gastroesophageal reflux disease)   . H/O degenerative disc disease   . Hemorrhoids   . HTN (hypertension)   . Hyperlipidemia   . Lung cancer ([place])   . LVH (left ventricular hypertrophy)    on EKG  . Mass of lower lobe of left lung   . Mediastinal adenopathy   . Smoker    former  . Wears dentures   . Wears dentures   . Wears glasses   . Wears glasses     Patient Active Problem List   Diagnosis Date Noted  . Trachea, stenosis   . Tracheostomy status ([place])   . Hypotension due to hypovolemia 04/20/2020  . Left lower lobe pneumonia 04/20/2020  . Pressure injury of skin 04/20/2020  . Acute renal failure (ARF) ([place]) 04/19/2020  . Pleuritic chest pain 03/17/2020  . Chemotherapy-induced neuropathy ([place]) 02/12/2020  . Vocal cord dysfunction 02/12/2020  . 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 Stridor 12/18/2019  . Abnormal findings on diagnostic imaging of lung 11/06/2019  . Antineoplastic chemotherapy induced anemia 10/16/2019  . Shortness of breath 10/15/2019  . Odynophagia 09/25/2019  . Port-A-Cath in place  09/17/2019  . Decreased appetite 09/03/2019  . Malignant neoplasm of lower lobe of left lung ([place]) 08/28/2019  . Encounter for antineoplastic chemotherapy 08/24/2019  . Encounter for antineoplastic immunotherapy 08/24/2019  . Goals of care, counseling/discussion 08/24/2019  . Atherosclerosis of aorta ([place]) 04/09/2019  . Carpal tunnel syndrome, left upper limb 02/19/2019  . History of carpal tunnel surgery of right wrist 12/21/2018  . OAB (overactive bladder) 10/11/2017  . Diabetic nephropathy associated with type 2 diabetes mellitus ([place]) 10/11/2017  . Onychomycosis of multiple toenails with type 2 diabetes mellitus ([place]) 07/22/2015  . Former smoker, stopped smoking in distant past 07/22/2015  . Diabetes mellitus type II, non insulin dependent ([place]) 06/13/2011  . ED (erectile dysfunction) 06/13/2011  . History of prostate cancer 06/13/2011  . Hypertension associated with diabetes ([place]) 06/13/2011  . Hyperlipidemia associated with type 2 diabetes mellitus ([place]) 06/13/2011  . Allergic rhinitis, mild 06/13/2011  . History of asthma 06/13/2011  . Carotid stenosis 06/13/2011    Past Surgical History:  Procedure Laterality Date  . BRONCHIAL BIOPSY  08/20/2019   Procedure: BRONCHIAL BIOPSIES;  Surgeon: Collene Gobble, MD;  Location: [facility] ENDOSCOPY;  Service: Pulmonary;;  . BRONCHIAL BRUSHINGS  08/20/2019   Procedure: BRONCHIAL BRUSHINGS;  Surgeon:  Leslye Peer, MD;  Location: [facility] ENDOSCOPY;  Service: Pulmonary;;  . BRONCHIAL NEEDLE ASPIRATION BIOPSY  08/20/2019   Procedure: BRONCHIAL NEEDLE ASPIRATION BIOPSIES;  Surgeon: Leslye Peer, MD;  Location: [facility] ENDOSCOPY;  Service: Pulmonary;;  . CARPAL TUNNEL RELEASE Right 01/09/2019   Procedure: CARPAL TUNNEL RELEASE;  Surgeon: Tarry Kos, MD;  Location: [facility];  Service: Orthopedics;  Laterality: Right;  . CATARACT EXTRACTION Right 2018  . COLONOSCOPY  2007   Dr. Elnoria Culley  . DIRECT LARYNGOSCOPY N/A 04/10/2020   Procedure:  DIRECT LARYNGOSCOPY;  Surgeon: Christia Reading, MD;  Location: [facility] OR;  Service: ENT;  Laterality: N/A;  . I & D EXTREMITY Right 04/02/2016   Procedure: IRRIGATION AND DEBRIDEMENT GREAT TOE;  Surgeon: Tarry Kos, MD;  Location: [facility] OR;  Service: Orthopedics;  Laterality: Right;  . IR IMAGING GUIDED PORT INSERTION  08/30/2019  . MULTIPLE TOOTH EXTRACTIONS    . RADIOACTIVE SEED IMPLANT  2003  . TRACHEOSTOMY TUBE PLACEMENT N/A 04/10/2020   Procedure: TRACHEOSTOMY;  Surgeon: Christia Reading, MD;  Location: [facility] OR;  Service: ENT;  Laterality: N/A;  . ULNAR TUNNEL RELEASE Right 01/09/2019   Procedure: RIGHT CUBITAL TUNNEL RELEASE AND CARPAL TUNNEL RELEASE;  Surgeon: Tarry Kos, MD;  Location: [facility];  Service: Orthopedics;  Laterality: Right;  . UPPER GASTROINTESTINAL ENDOSCOPY    . VIDEO BRONCHOSCOPY N/A 12/18/2019   Procedure: VIDEO BRONCHOSCOPY WITHOUT FLUORO;  Surgeon: Leslye Peer, MD;  Location: [facility] ENDOSCOPY;  Service: Cardiopulmonary;  Laterality: N/A;  . VIDEO BRONCHOSCOPY WITH ENDOBRONCHIAL ULTRASOUND N/A 08/20/2019   Procedure: VIDEO BRONCHOSCOPY WITH ENDOBRONCHIAL ULTRASOUND;  Surgeon: Leslye Peer, MD;  Location: [facility] ENDOSCOPY;  Service: Pulmonary;  Laterality: N/A;       Family History  Problem Relation Age of Onset  . Heart failure Mother   . Diabetes Mother   . Stroke Father   . Diabetes Father   . Diabetes Sister   . Diabetes Sister   . Lung cancer Sister 54  . Colon cancer Neg Hx   . Esophageal cancer Neg Hx   . Rectal cancer Neg Hx   . Colon polyps Neg Hx   . Stomach cancer Neg Hx     Social History   Tobacco Use  . Smoking status: Former Smoker    Packs/day: 1.00    Years: 32.00    Pack years: 32.00    Start date: 02/08/1955    Quit date: 10/09/1987    Years since quitting: 32.6  . Smokeless tobacco: Never Used  Vaping Use  . Vaping Use: Never used  Substance Use Topics  . Alcohol use: No  . Drug use: No    Home Medications Prior to  Admission medications   Medication Sig Start Date End Date Taking? Authorizing Provider  doxycycline (VIBRAMYCIN) 100 MG capsule Take 1 capsule (100 mg total) by mouth 2 (two) times daily. 05/27/20  Yes Jacalyn Lefevre, MD  HYDROcodone-acetaminophen (NORCO/VICODIN) 5-325 MG tablet Take 1 tablet by mouth every 4 (four) hours as needed. 05/27/20  Yes Jacalyn Lefevre, MD  acetaminophen (TYLENOL) 500 MG tablet Take 1,000 mg by mouth every 4 (four) hours as needed.    [provider]  amLODipine (NORVASC) 5 MG tablet Take 1 tablet (5 mg total) by mouth daily. 04/07/20   Ronnald Nian, MD  amoxicillin-clavulanate (AUGMENTIN) 875-125 MG tablet Take 1 tablet by mouth every 12 (twelve) hours. Patient not taking: Reported on 04/29/2020 04/23/20  Nita Sells, MD  ANORO ELLIPTA 62.5-25 MCG/INH AEPB Inhale 1 puff into the lungs daily. Patient not taking: Reported on 04/29/2020 01/29/20   [provider]  aspirin EC 81 MG tablet Take 81 mg by mouth daily after breakfast.     [provider]  Blood Glucose Monitoring Suppl (ONETOUCH VERIO) w/Device KIT USE TO CHECK SUGAR DAILY 02/21/17   [name], MD  ferrous sulfate 325 (65 FE) MG EC tablet Take 1 tablet (325 mg total) by mouth daily with breakfast. 04/07/20   [name], MD  Lancets (ONETOUCH DELICA PLUS MPNTIR44R) MISC PATIENT IS TO TEST TWO TIMES A DAY DX: E11.9 (ONETOUCH VERIO FLEX) 04/23/19   [name], MD  metFORMIN (GLUCOPHAGE-XR) 500 MG 24 hr tablet Take 1 tablet (500 mg total) by mouth daily. 04/07/20   [name], MD  methylPREDNISolone (MEDROL DOSEPAK) 4 MG TBPK tablet Use as instructed 05/20/20   Curt Bears, MD  Multiple Vitamin (MULTIVITAMIN WITH MINERALS) TABS tablet Take 1 tablet by mouth daily after breakfast.     [provider]  polyethylene glycol (MIRALAX / GLYCOLAX) 17 g packet Take 17 g by mouth daily as needed for moderate constipation. Patient not taking: No sig reported  04/23/20   Nita Sells, MD  senna-docusate (SENOKOT-S) 8.6-50 MG tablet Take 2 tablets by mouth 2 (two) times daily. Patient not taking: No sig reported 04/23/20   Nita Sells, MD  solifenacin (VESICARE) 5 MG tablet Take 1 tablet (5 mg total) by mouth daily. 05/12/20   [name], MD    Allergies    Patient has no known allergies.  Review of Systems   Review of Systems  Respiratory: Positive for cough and shortness of breath.   All other systems reviewed and are negative.   Physical Exam Updated Vital Signs BP 130/64   Pulse 84   Temp 98.5 F (36.9 C) (Oral)   Resp (!) 26   SpO2 95%   Physical Exam Vitals and nursing note reviewed.  Constitutional:      Appearance: Normal appearance.  HENT:     Head: Normocephalic and atraumatic.     Right Ear: External ear normal.     Left Ear: External ear normal.     Nose: Nose normal.     Mouth/Throat:     Mouth: Mucous membranes are moist.     Pharynx: Oropharynx is clear.  Eyes:     Extraocular Movements: Extraocular movements intact.     Conjunctiva/sclera: Conjunctivae normal.     Pupils: Pupils are equal, round, and reactive to light.  Neck:     Comments: Trach in place Cardiovascular:     Rate and Rhythm: Normal rate and regular rhythm.     Pulses: Normal pulses.     Heart sounds: Normal heart sounds.  Pulmonary:     Effort: Pulmonary effort is normal.     Breath sounds: Normal breath sounds.  Abdominal:     General: Abdomen is flat. Bowel sounds are normal.     Palpations: Abdomen is soft.  Musculoskeletal:        General: Normal range of motion.  Skin:    General: Skin is warm.     Capillary Refill: Capillary refill takes less than 2 seconds.  Neurological:     General: No focal deficit present.     Mental Status: He is alert and oriented to person, place, and time.  Psychiatric:        Mood  and Affect: Mood normal.        Behavior: Behavior normal.        Thought Content: Thought content  normal.        Judgment: Judgment normal.     ED Results / Procedures / Treatments   Labs (all labs ordered are listed, but only abnormal results are displayed) Labs Reviewed  CBC WITH DIFFERENTIAL/PLATELET - Abnormal; Notable for the following components:      Result Value   RBC 3.00 (*)    Hemoglobin 9.4 (*)    HCT 29.2 (*)    All other components within normal limits  COMPREHENSIVE METABOLIC PANEL - Abnormal; Notable for the following components:   Glucose, Bld 113 (*)    Albumin 3.4 (*)    GFR, Estimated 60 (*)    All other components within normal limits  CULTURE, BLOOD (ROUTINE X 2)  CULTURE, BLOOD (ROUTINE X 2)  RESP PANEL BY RT-PCR (FLU A&B, COVID) [id number]  LACTIC ACID, PLASMA  LACTIC ACID, PLASMA    EKG None  Radiology DG Chest 2 View  Result Date: 05/27/2020 CLINICAL DATA:  Shortness of breath EXAM: CHEST - 2 VIEW COMPARISON:  05/19/2020.  CT 05/25/2020 FINDINGS: Large masslike opacity again noted in the left perihilar region/upper lobe shown on prior CT to represent architectural distortion and probable post treatment changes. Right lung is clear. Heart is normal size. No effusions. Right Port-A-Cath and tracheostomy remain in place, unchanged. IMPRESSION: Left upper lobe masslike opacity, likely post treatment changes as seen on prior CT. No acute cardiopulmonary disease. Electronically Signed   By: Rolm Baptise M.D.   On: 05/27/2020 17:47    Procedures Procedures   Medications Ordered in ED Medications  doxycycline (VIBRA-TABS) tablet 100 mg (has no administration in time range)  HYDROcodone-acetaminophen (NORCO/VICODIN) 5-325 MG per tablet 1 tablet (has no administration in time range)    ED Course  I have reviewed the triage vital signs and the nursing notes.  Pertinent labs & imaging results that were available during my care of the patient were reviewed by me and considered in my medical decision making (see chart for details).    MDM  Rules/Calculators/A&P                          Trach changed by RT.  Size 7 placed as we don't have half sizes here.  Pt tolerated that well.  Labs unremarkable for anything acute (chronic anemia).  Pt has had increased secretions, so I will start him on doxy in case he is developing a pneumonia.   Pt is to return if worse.  F/u with pcp.  Final Clinical Impression(s) / ED Diagnoses Final diagnoses:  Bronchitis  Malignant neoplasm of left lung, unspecified part of lung ([place])  Encounter for tracheostomy tube change ([place])    Rx / DC Orders ED Discharge Orders         Ordered    doxycycline (VIBRAMYCIN) 100 MG capsule  2 times daily        05/27/20 1934    HYDROcodone-acetaminophen (NORCO/VICODIN) 5-325 MG tablet  Every 4 hours PRN        05/27/20 1934           Isla Pence, MD 05/27/20 1936

## 2020-05-27 NOTE — ED Notes (Signed)
Respiratory called to change patient's trach.

## 2020-05-27 NOTE — Telephone Encounter (Signed)
Called to inform patient of his upcoming appointment. Patient's wife is aware and will pick up a calendar on Friday.

## 2020-05-27 NOTE — ED Notes (Signed)
Patient transported to X-ray 

## 2020-05-27 NOTE — ED Triage Notes (Signed)
Story provided by wife, EMS was called out today for SOB and congestion, EMS changed his trach out. EMS removed a 7.5 mm tube, placed 6.5 mm tube, per note MD would like 7.5 placed back.

## 2020-05-27 NOTE — Telephone Encounter (Signed)
Received a call from Paramedics who were a pts house responding to a call re his trach tube clogged ,malfunctioned and breathing. It was reported to me that pt had a clogged trach tube and unable to suction successfully.   I believe EMS  replaced his 7.5  trach tube  to a 6.5.  Because of the  temp and rhochi the  Paramedics ( or his wife ) were planning to take  him to ED. Pt reluctant to go .

## 2020-05-28 ENCOUNTER — Telehealth: Payer: Self-pay

## 2020-05-28 ENCOUNTER — Other Ambulatory Visit: Payer: Medicare HMO

## 2020-05-28 ENCOUNTER — Ambulatory Visit: Payer: Medicare HMO | Admitting: Internal Medicine

## 2020-05-28 DIAGNOSIS — C3432 Malignant neoplasm of lower lobe, left bronchus or lung: Secondary | ICD-10-CM

## 2020-05-28 MED ORDER — DRONABINOL 2.5 MG PO CAPS: 2.5000 mg | ORAL_CAPSULE | Freq: Two times a day (BID) | ORAL | 0 refills | Status: DC

## 2020-05-28 NOTE — Telephone Encounter (Signed)
Pt needs refill Dronabinol 2.5mg  to [facility].

## 2020-05-29 ENCOUNTER — Inpatient Hospital Stay: Payer: Medicare HMO

## 2020-05-29 ENCOUNTER — Other Ambulatory Visit: Payer: Self-pay | Admitting: Internal Medicine

## 2020-05-29 ENCOUNTER — Inpatient Hospital Stay: Payer: Medicare HMO | Admitting: Internal Medicine

## 2020-05-29 ENCOUNTER — Telehealth: Payer: Self-pay

## 2020-05-29 ENCOUNTER — Other Ambulatory Visit: Payer: Self-pay

## 2020-05-29 VITALS — BP 129/70 | HR 87 | Temp 97.6°F | Resp 20 | Ht 67.0 in | Wt 171.0 lb

## 2020-05-29 DIAGNOSIS — Z87891 Personal history of nicotine dependence: Secondary | ICD-10-CM | POA: Diagnosis not present

## 2020-05-29 DIAGNOSIS — I2510 Atherosclerotic heart disease of native coronary artery without angina pectoris: Secondary | ICD-10-CM | POA: Diagnosis not present

## 2020-05-29 DIAGNOSIS — Z7984 Long term (current) use of oral hypoglycemic drugs: Secondary | ICD-10-CM | POA: Diagnosis not present

## 2020-05-29 DIAGNOSIS — Z95828 Presence of other vascular implants and grafts: Secondary | ICD-10-CM

## 2020-05-29 DIAGNOSIS — Z7982 Long term (current) use of aspirin: Secondary | ICD-10-CM | POA: Diagnosis not present

## 2020-05-29 DIAGNOSIS — C3432 Malignant neoplasm of lower lobe, left bronchus or lung: Secondary | ICD-10-CM | POA: Diagnosis not present

## 2020-05-29 DIAGNOSIS — Z5112 Encounter for antineoplastic immunotherapy: Secondary | ICD-10-CM | POA: Diagnosis not present

## 2020-05-29 DIAGNOSIS — Z7952 Long term (current) use of systemic steroids: Secondary | ICD-10-CM | POA: Diagnosis not present

## 2020-05-29 DIAGNOSIS — Z79899 Other long term (current) drug therapy: Secondary | ICD-10-CM | POA: Diagnosis not present

## 2020-05-29 DIAGNOSIS — Z9221 Personal history of antineoplastic chemotherapy: Secondary | ICD-10-CM | POA: Diagnosis not present

## 2020-05-29 DIAGNOSIS — C3490 Malignant neoplasm of unspecified part of unspecified bronchus or lung: Secondary | ICD-10-CM

## 2020-05-29 LAB — CBC WITH DIFFERENTIAL (CANCER CENTER ONLY)
Abs Immature Granulocytes: 0.01 10*3/uL (ref 0.00–0.07)
Basophils Absolute: 0 10*3/uL (ref 0.0–0.1)
Basophils Relative: 0 %
Eosinophils Absolute: 0.1 10*3/uL (ref 0.0–0.5)
Eosinophils Relative: 2 %
HCT: 28.6 % — ABNORMAL LOW (ref 39.0–52.0)
Hemoglobin: 9.2 g/dL — ABNORMAL LOW (ref 13.0–17.0)
Immature Granulocytes: 0 %
Lymphocytes Relative: 26 %
Lymphs Abs: 1.5 10*3/uL (ref 0.7–4.0)
MCH: 31.1 pg (ref 26.0–34.0)
MCHC: 32.2 g/dL (ref 30.0–36.0)
MCV: 96.6 fL (ref 80.0–100.0)
Monocytes Absolute: 0.7 10*3/uL (ref 0.1–1.0)
Monocytes Relative: 12 %
Neutro Abs: 3.5 10*3/uL (ref 1.7–7.7)
Neutrophils Relative %: 60 %
Platelet Count: 292 10*3/uL (ref 150–400)
RBC: 2.96 MIL/uL — ABNORMAL LOW (ref 4.22–5.81)
RDW: 13.7 % (ref 11.5–15.5)
WBC Count: 5.8 10*3/uL (ref 4.0–10.5)
nRBC: 0 % (ref 0.0–0.2)

## 2020-05-29 LAB — CMP (CANCER CENTER ONLY)
ALT: 14 U/L (ref 0–44)
AST: 17 U/L (ref 15–41)
Albumin: 3.3 g/dL — ABNORMAL LOW (ref 3.5–5.0)
Alkaline Phosphatase: 48 U/L (ref 38–126)
Anion gap: 8 (ref 5–15)
BUN: 16 mg/dL (ref 8–23)
CO2: 28 mmol/L (ref 22–32)
Calcium: 9.1 mg/dL (ref 8.9–10.3)
Chloride: 104 mmol/L (ref 98–111)
Creatinine: 1.03 mg/dL (ref 0.61–1.24)
GFR, Estimated: >60 mL/min (ref >60)
Glucose, Bld: 98 mg/dL (ref 70–99)
Potassium: 4.3 mmol/L (ref 3.5–5.1)
Sodium: 140 mmol/L (ref 135–145)
Total Bilirubin: 0.7 mg/dL (ref 0.3–1.2)
Total Protein: 7.7 g/dL (ref 6.5–8.1)

## 2020-05-29 MED ORDER — SODIUM CHLORIDE 0.9% FLUSH
10.0000 mL | Freq: Once | INTRAVENOUS | Status: AC
  Administered 2020-05-29: 10 mL
  Filled 2020-05-29: qty 10

## 2020-05-29 MED ORDER — HEPARIN SOD (PORK) LOCK FLUSH 100 UNIT/ML IV SOLN
500.0000 [IU] | Freq: Once | INTRAVENOUS | Status: AC | PRN
  Administered 2020-05-29: 500 [IU]
  Filled 2020-05-29: qty 5

## 2020-05-29 MED ORDER — DIPHENHYDRAMINE HCL 50 MG/ML IJ SOLN
INTRAMUSCULAR | Status: AC
  Filled 2020-05-29: qty 1

## 2020-05-29 MED ORDER — SODIUM CHLORIDE 0.9% FLUSH
10.0000 mL | INTRAVENOUS | Status: DC | PRN
  Administered 2020-05-29: 10 mL
  Filled 2020-05-29: qty 10

## 2020-05-29 MED ORDER — DIPHENHYDRAMINE HCL 50 MG/ML IJ SOLN
25.0000 mg | Freq: Once | INTRAMUSCULAR | Status: AC
Start: 2020-05-29 — End: 2020-05-29
  Administered 2020-05-29: 25 mg via INTRAVENOUS

## 2020-05-29 MED ORDER — FAMOTIDINE IN NACL 20-0.9 MG/50ML-% IV SOLN
20.0000 mg | Freq: Once | INTRAVENOUS | Status: AC
  Administered 2020-05-29: 20 mg via INTRAVENOUS

## 2020-05-29 MED ORDER — SODIUM CHLORIDE 0.9 % IV SOLN
360.0000 mg | Freq: Once | INTRAVENOUS | Status: AC
  Administered 2020-05-29: 360 mg via INTRAVENOUS
  Filled 2020-05-29: qty 12

## 2020-05-29 MED ORDER — SODIUM CHLORIDE 0.9 % IV SOLN
1.0000 mg/kg | Freq: Once | INTRAVENOUS | Status: AC
  Administered 2020-05-29: 80 mg via INTRAVENOUS
  Filled 2020-05-29: qty 16

## 2020-05-29 MED ORDER — FAMOTIDINE IN NACL 20-0.9 MG/50ML-% IV SOLN
INTRAVENOUS | Status: AC
  Filled 2020-05-29: qty 50

## 2020-05-29 MED ORDER — SODIUM CHLORIDE 0.9 % IV SOLN
Freq: Once | INTRAVENOUS | Status: AC
  Filled 2020-05-29: qty 250

## 2020-05-29 NOTE — Progress Notes (Signed)
[place] [phone]   [phone]  OFFICE PROGRESS NOTE  [name], [place] [address]  DIAGNOSIS: stage IV (T3, N2, M1 a) non-small cell lung cancer, squamous cell carcinoma presented with obstructive left lower lobe lung mass in addition to mediastinal lymphadenopathy as well as bilateral pulmonary nodules diagnosed in July 2021.  PDL1: 0%  PRIOR THERAPY: None  CURRENT THERAPY: 1) Palliative radiotherapy to the obstructive left lower lobe lung mass under the care of Dr. Lisbeth Renshaw.  2)  systemic chemotherapy 2 cycles of chemotherapy with carboplatin for an AUC of 5 and paclitaxel 175 mg/m2 in addition to immunotherapy with nivolumab 360 mg every 3 weeks and ipilimumab 1 mg/kg IV every 6 weeks followed by maintenance nivolumab and ipilimumab.  He started the first treatment on 09/04/2019.  He is status post 6 cycles of the treatment.  INTERVAL HISTORY: Shaun Kennedy 80 y.o. male returns to the clinic today for follow-up visit accompanied by his wife.  His daughter was available by phone during the visit.  The patient continues to complain of shortness of breath which has been going on after the tracheostomy tube placement.  He denied having any current fever or chills.  He has no nausea, vomiting, diarrhea or constipation.  He has no chest pain but has mild cough with no hemoptysis.  He has no weight loss or night sweats.  He was treated with a short course of steroid with no improvement in his condition.  He had repeat CT scan of the chest performed recently and he is here for evaluation and discussion of his scan results before resuming his treatment.  MEDICAL HISTORY: Past Medical History:  Diagnosis Date  . Allergic rhinitis   . Asthma   . Carotid stenosis   . Colon cancer ([place]) 2003  . Diabetes mellitus ([place])   . Diverticulosis   . Dyslipidemia   . ED (erectile dysfunction)   . GERD (gastroesophageal  reflux disease)   . H/O degenerative disc disease   . Hemorrhoids   . HTN (hypertension)   . Hyperlipidemia   . Lung cancer ([place])   . LVH (left ventricular hypertrophy)    on EKG  . Mass of lower lobe of left lung   . Mediastinal adenopathy   . Smoker    former  . Wears dentures   . Wears dentures   . Wears glasses   . Wears glasses     ALLERGIES:  has No Known Allergies.  MEDICATIONS:  Current Outpatient Medications  Medication Sig Dispense Refill  . acetaminophen (TYLENOL) 500 MG tablet Take 1,000 mg by mouth every 4 (four) hours as needed.    Marland Kitchen amLODipine (NORVASC) 5 MG tablet Take 1 tablet (5 mg total) by mouth daily. 90 tablet 3  . amoxicillin-clavulanate (AUGMENTIN) 875-125 MG tablet Take 1 tablet by mouth every 12 (twelve) hours. (Patient not taking: Reported on 04/29/2020) 8 tablet 0  . ANORO ELLIPTA 62.5-25 MCG/INH AEPB Inhale 1 puff into the lungs daily. (Patient not taking: Reported on 04/29/2020)    . aspirin EC 81 MG tablet Take 81 mg by mouth daily after breakfast.     . Blood Glucose Monitoring Suppl (ONETOUCH VERIO) w/Device KIT USE TO CHECK SUGAR DAILY 1 kit 0  . doxycycline (VIBRAMYCIN) 100 MG capsule Take 1 capsule (100 mg total) by mouth 2 (two) times daily. 14 capsule 0  . dronabinol (MARINOL) 2.5 MG capsule Take 1  capsule (2.5 mg total) by mouth 2 (two) times daily before a meal. 60 capsule 0  . ferrous sulfate 325 (65 FE) MG EC tablet Take 1 tablet (325 mg total) by mouth daily with breakfast. 30 tablet 12  . HYDROcodone-acetaminophen (NORCO/VICODIN) 5-325 MG tablet Take 1 tablet by mouth every 4 (four) hours as needed. 10 tablet 0  . Lancets (ONETOUCH DELICA PLUS [id number]) MISC PATIENT IS TO TEST TWO TIMES A DAY DX: E11.9 (ONETOUCH VERIO FLEX) 100 each 12  . metFORMIN (GLUCOPHAGE-XR) 500 MG 24 hr tablet Take 1 tablet (500 mg total) by mouth daily. 90 tablet 1  . methylPREDNISolone (MEDROL DOSEPAK) 4 MG TBPK tablet Use as instructed 21 tablet 0  . Multiple  Vitamin (MULTIVITAMIN WITH MINERALS) TABS tablet Take 1 tablet by mouth daily after breakfast.     . polyethylene glycol (MIRALAX / GLYCOLAX) 17 g packet Take 17 g by mouth daily as needed for moderate constipation. (Patient not taking: No sig reported) 14 each 0  . senna-docusate (SENOKOT-S) 8.6-50 MG tablet Take 2 tablets by mouth 2 (two) times daily. (Patient not taking: No sig reported) 30 tablet 0  . solifenacin (VESICARE) 5 MG tablet Take 1 tablet (5 mg total) by mouth daily. 90 tablet 1   No current facility-administered medications for this visit.   Facility-Administered Medications Ordered in Other Visits  Medication Dose Route Frequency Provider Last Rate Last Admin  . sodium chloride flush (NS) 0.9 % injection 10 mL  10 mL Intracatheter PRN Curt Bears, MD   10 mL at 09/04/19 1556    SURGICAL HISTORY:  Past Surgical History:  Procedure Laterality Date  . BRONCHIAL BIOPSY  08/20/2019   Procedure: BRONCHIAL BIOPSIES;  Surgeon: Collene Gobble, MD;  Location: [facility] ENDOSCOPY;  Service: Pulmonary;;  . BRONCHIAL BRUSHINGS  08/20/2019   Procedure: BRONCHIAL BRUSHINGS;  Surgeon: Collene Gobble, MD;  Location: [facility] ENDOSCOPY;  Service: Pulmonary;;  . BRONCHIAL NEEDLE ASPIRATION BIOPSY  08/20/2019   Procedure: BRONCHIAL NEEDLE ASPIRATION BIOPSIES;  Surgeon: Collene Gobble, MD;  Location: [facility] ENDOSCOPY;  Service: Pulmonary;;  . CARPAL TUNNEL RELEASE Right 01/09/2019   Procedure: CARPAL TUNNEL RELEASE;  Surgeon: Leandrew Koyanagi, MD;  Location: [place];  Service: Orthopedics;  Laterality: Right;  . CATARACT EXTRACTION Right 2018  . COLONOSCOPY  2007   Dr. Benson Norway  . DIRECT LARYNGOSCOPY N/A 04/10/2020   Procedure: DIRECT LARYNGOSCOPY;  Surgeon: Melida Quitter, MD;  Location: [facility];  Service: ENT;  Laterality: N/A;  . I & D EXTREMITY Right 04/02/2016   Procedure: IRRIGATION AND DEBRIDEMENT GREAT TOE;  Surgeon: Leandrew Koyanagi, MD;  Location: [place];  Service: Orthopedics;  Laterality:  Right;  . IR IMAGING GUIDED PORT INSERTION  08/30/2019  . MULTIPLE TOOTH EXTRACTIONS    . RADIOACTIVE SEED IMPLANT  2003  . TRACHEOSTOMY TUBE PLACEMENT N/A 04/10/2020   Procedure: TRACHEOSTOMY;  Surgeon: Melida Quitter, MD;  Location: [place];  Service: ENT;  Laterality: N/A;  . ULNAR TUNNEL RELEASE Right 01/09/2019   Procedure: RIGHT CUBITAL TUNNEL RELEASE AND CARPAL TUNNEL RELEASE;  Surgeon: Leandrew Koyanagi, MD;  Location: [place];  Service: Orthopedics;  Laterality: Right;  . UPPER GASTROINTESTINAL ENDOSCOPY    . VIDEO BRONCHOSCOPY N/A 12/18/2019   Procedure: VIDEO BRONCHOSCOPY WITHOUT FLUORO;  Surgeon: Collene Gobble, MD;  Location: [facility] ENDOSCOPY;  Service: Cardiopulmonary;  Laterality: N/A;  . VIDEO BRONCHOSCOPY WITH ENDOBRONCHIAL ULTRASOUND N/A 08/20/2019   Procedure: VIDEO BRONCHOSCOPY WITH ENDOBRONCHIAL  ULTRASOUND;  Surgeon: Collene Gobble, MD;  Location: [facility] ENDOSCOPY;  Service: Pulmonary;  Laterality: N/A;    REVIEW OF SYSTEMS:  Constitutional: positive for fatigue Eyes: negative Ears, nose, mouth, throat, and face: negative Respiratory: positive for cough and dyspnea on exertion Cardiovascular: negative Gastrointestinal: negative Genitourinary:negative Integument/breast: negative Hematologic/lymphatic: negative Musculoskeletal:negative Neurological: negative Behavioral/Psych: negative Endocrine: negative Allergic/Immunologic: negative   PHYSICAL EXAMINATION: General appearance: alert, cooperative, fatigued and no distress Head: Normocephalic, without obvious abnormality, atraumatic Neck: no adenopathy, no JVD, supple, symmetrical, trachea midline and thyroid not enlarged, symmetric, no tenderness/mass/nodules Lymph nodes: Cervical, supraclavicular, and axillary nodes normal. Resp: rales LUL Back: symmetric, no curvature. ROM normal. No CVA tenderness. Cardio: regular rate and rhythm, S1, S2 normal, no murmur, click, rub or gallop GI: soft, non-tender; bowel  sounds normal; no masses,  no organomegaly Extremities: extremities normal, atraumatic, no cyanosis or edema Neurologic: Alert and oriented X 3, normal strength and tone. Normal symmetric reflexes. Normal coordination and gait  ECOG PERFORMANCE STATUS: 1 - Symptomatic but completely ambulatory  Blood pressure 129/70, pulse 87, temperature 97.6 F (36.4 C), temperature source Tympanic, resp. rate 20, height _0  (1.702 m), weight 171 lb (77.6 kg), SpO2 97 %.  LABORATORY DATA: Lab Results  Component Value Date   WBC 5.8 05/29/2020   HGB 9.2 (L) 05/29/2020   HCT 28.6 (L) 05/29/2020   MCV 96.6 05/29/2020   PLT 292 05/29/2020      Chemistry      Component Value Date/Time   NA 140 05/27/2020 1809   NA 132 (L) 08/14/2019 0918   K 3.9 05/27/2020 1809   CL 102 05/27/2020 1809   CO2 30 05/27/2020 1809   BUN 21 05/27/2020 1809   BUN 16 08/14/2019 0918   CREATININE 1.22 05/27/2020 1809   CREATININE 1.10 05/20/2020 0950   CREATININE 1.25 (H) 08/08/2016 0912      Component Value Date/Time   CALCIUM 9.2 05/27/2020 1809   ALKPHOS 47 05/27/2020 1809   AST 16 05/27/2020 1809   AST 18 05/20/2020 0950   ALT 15 05/27/2020 1809   ALT 19 05/20/2020 0950   BILITOT 0.8 05/27/2020 1809   BILITOT 0.6 05/20/2020 0950       RADIOGRAPHIC STUDIES: DG Chest 2 View  Result Date: 05/27/2020 CLINICAL DATA:  Shortness of breath EXAM: CHEST - 2 VIEW COMPARISON:  05/19/2020.  CT 05/25/2020 FINDINGS: Large masslike opacity again noted in the left perihilar region/upper lobe shown on prior CT to represent architectural distortion and probable post treatment changes. Right lung is clear. Heart is normal size. No effusions. Right Port-A-Cath and tracheostomy remain in place, unchanged. IMPRESSION: Left upper lobe masslike opacity, likely post treatment changes as seen on prior CT. No acute cardiopulmonary disease. Electronically Signed   By: Rolm Baptise M.D.   On: 05/27/2020 17:47   DG Chest 2  View  Result Date: 05/19/2020 CLINICAL DATA:  Worsening shortness of breath for 1 day. History of left lung cancer treated with chemotherapy. Ex-smoker. EXAM: CHEST - 2 VIEW COMPARISON:  04/19/2020 and chest CT dated 04/19/2020 FINDINGS: Irregular, cavitary appearing mass-like area lateral to the upper aorta, corresponding to the patient's known malignancy. This is denser and more defined than seen on radiographs dated 04/19/2020 and more cavitary in appearance than seen on the CT. In the frontal projection, this measures approximate 6.6 x 4.5 cm. On the lateral view, this measures approximately 8.0 cm in AP diameter. Clear right lung. Tracheostomy in satisfactory position. Right jugular porta  catheter tip in the superior vena cava. Unremarkable bones. IMPRESSION: Irregular, cavitary appearing mass-like area lateral to the upper aorta, corresponding to the patient's known malignancy. This is denser and more mass-like than seen radiographically on 04/19/2020 and more cavitary than seen on the CT on that day. Electronically Signed   By: Claudie Revering M.D.   On: 05/19/2020 15:27   CT CHEST W CONTRAST  Result Date: 05/25/2020 CLINICAL DATA:  Unintended weight loss, history of lung cancer and tracheostomy EXAM: CT CHEST WITH CONTRAST TECHNIQUE: Multidetector CT imaging of the chest was performed during intravenous contrast administration. CONTRAST:  4m ISOVUE-300 IOPAMIDOL (ISOVUE-300) INJECTION 61% COMPARISON:  04/19/2020 FINDINGS: Cardiovascular: Right chest port catheter. Aortic atherosclerosis. Normal heart size. Extensive 3 vessel coronary artery calcifications. No pericardial effusion. Mediastinum/Nodes: No significant change in enlarged subcarinal lymph nodes measuring up to 2.7 x 1.7 cm (series 2, image 72). Unchanged soft tissue thickening about the left hilum and AP window. Tracheostomy. Thyroid gland and esophagus demonstrate no significant findings. Lungs/Pleura: Mild centrilobular and paraseptal  emphysema. No significant interval change in fibrosis, consolidation, volume loss, and architectural distortion of the perihilar left lung as well as the paramedian left upper lobe and superior segment left lower lobe. There has however been significant interval improvement in clustered centrilobular and tree-in-bud nodularity in the dependent left lung base, almost completely resolved (series 5, image 104). Resolved consolidation of the dependent right lower lobe. Additional paramedian fibrosis and consolidation of the right lung. Multiple unchanged small pulmonary nodules, for example a 6 mm nodule of the anterior right middle lobe (series 5, image 97) and a 6 mm nodule of the posterior left upper lobe (series 5, image 41). No pleural effusion or pneumothorax. Upper Abdomen: No acute abnormality. Musculoskeletal: No chest wall mass or suspicious bone lesions identified. IMPRESSION: 1. No significant interval change post treatment fibrosis and consolidation of the perihilar left lung as well as the paramedian left upper lobe and superior segment left lower lobe. Additional paramedian fibrosis and consolidation of the right lung. 2. Unchanged enlarged subcarinal lymph nodes as well as soft tissue thickening about the left hilum and AP window. 3. There has been significant interval improvement in clustered centrilobular and tree-in-bud nodularity in the dependent left lung base, almost completely resolved. Additionally resolved consolidation of the dependent right lower lobe. Findings are consistent with resolution of infection or aspiration. 4. Multiple unchanged small pulmonary nodules, which remain nonspecific. Continued attention on follow-up. 5. Emphysema. 6. Coronary artery disease. Aortic Atherosclerosis (ICD10-I70.0) and Emphysema (ICD10-J43.9). Electronically Signed   By: AEddie CandleM.D.   On: 05/25/2020 16:27    ASSESSMENT AND PLAN: This is a very pleasant 80years old [name] with stage  IV non-small cell lung cancer, squamous cell carcinoma with negative PD-L1 expression. The patient is currently undergoing systemic chemotherapy with carboplatin and paclitaxel for 2 cycles in addition to immunotherapy with ipilimumab 1 mg/KG every 6 weeks and nivolumab 360 mg IV every 3 weeks status post 6 cycles. The patient has been tolerating his treatment fairly well with no concerning adverse effects. He had repeat CT scan of the chest performed recently.  I personally and independently reviewed the scans and discussed the results with the patient and his family. His scan showed no concerning findings for disease progression and there was improvement of the inflammatory process in his lung consistent with resolution of infection or aspiration. I recommended for the patient to resume his treatment with maintenance ipilimumab and nivolumab and he  will start cycle #7 today. Will come back for follow-up visit in 3 weeks for evaluation before day 22 of cycle #7. The patient was advised to call immediately if he has any concerning symptoms in the interval. For the issues with the tracheostomy, he will follow-up with his ENT physician for evaluation and management.  The patient voices understanding of current disease status and treatment options and is in agreement with the current care plan.  All questions were answered. The patient knows to call the clinic with any problems, questions or concerns. We can certainly see the patient much sooner if necessary.  Disclaimer: This note was dictated with voice recognition software. Similar sounding words can inadvertently be transcribed and may not be corrected upon review.

## 2020-05-29 NOTE — Telephone Encounter (Signed)
LVM for pt advising it was taken care of. [place]

## 2020-05-29 NOTE — Telephone Encounter (Signed)
I took care of it 

## 2020-05-29 NOTE — Patient Instructions (Signed)
Nivolumab injection What is this medicine? NIVOLUMAB (nye VOL ue mab) is a monoclonal antibody. It treats certain types of cancer. Some of the cancers treated are colon cancer, head and neck cancer, Hodgkin lymphoma, lung cancer, and melanoma. This medicine may be used for other purposes; ask your health care provider or pharmacist if you have questions. COMMON BRAND NAME(S): Opdivo What should I tell my health care provider before I take this medicine? They need to know if you have any of these conditions:  autoimmune diseases like Crohn's disease, ulcerative colitis, or lupus  have had or planning to have an allogeneic stem cell transplant (uses someone else's stem cells)  history of chest radiation  history of organ transplant  nervous system problems like myasthenia gravis or Guillain-Barre syndrome  an unusual or allergic reaction to nivolumab, other medicines, foods, dyes, or preservatives  pregnant or trying to get pregnant  breast-feeding How should I use this medicine? This medicine is for infusion into a vein. It is given by a health care professional in a hospital or clinic setting. A special MedGuide will be given to you before each treatment. Be sure to read this information carefully each time. Talk to your pediatrician regarding the use of this medicine in children. While this drug may be prescribed for children as young as 12 years for selected conditions, precautions do apply. Overdosage: If you think you have taken too much of this medicine contact a poison control center or emergency room at once. NOTE: This medicine is only for you. Do not share this medicine with others. What if I miss a dose? It is important not to miss your dose. Call your doctor or health care professional if you are unable to keep an appointment. What may interact with this medicine? Interactions have not been studied. This list may not describe all possible interactions. Give your health  care provider a list of all the medicines, herbs, non-prescription drugs, or dietary supplements you use. Also tell them if you smoke, drink alcohol, or use illegal drugs. Some items may interact with your medicine. What should I watch for while using this medicine? This drug may make you feel generally unwell. Continue your course of treatment even though you feel ill unless your doctor tells you to stop. You may need blood work done while you are taking this medicine. Do not become pregnant while taking this medicine or for 5 months after stopping it. Women should inform their doctor if they wish to become pregnant or think they might be pregnant. There is a potential for serious side effects to an unborn child. Talk to your health care professional or pharmacist for more information. Do not breast-feed an infant while taking this medicine or for 5 months after stopping it. What side effects may I notice from receiving this medicine? Side effects that you should report to your doctor or health care professional as soon as possible:  allergic reactions like skin rash, itching or hives, swelling of the face, lips, or tongue  breathing problems  blood in the urine  bloody or watery diarrhea or black, tarry stools  changes in emotions or moods  changes in vision  chest pain  cough  dizziness  feeling faint or lightheaded, falls  fever, chills  headache with fever, neck stiffness, confusion, loss of memory, sensitivity to light, hallucination, loss of contact with reality, or seizures  joint pain  mouth sores  redness, blistering, peeling or loosening of the skin, including inside the  mouth  severe muscle pain or weakness  signs and symptoms of high blood sugar such as dizziness; dry mouth; dry skin; fruity breath; nausea; stomach pain; increased hunger or thirst; increased urination  signs and symptoms of kidney injury like trouble passing urine or change in the amount of  urine  signs and symptoms of liver injury like dark yellow or brown urine; general ill feeling or flu-like symptoms; light-colored stools; loss of appetite; nausea; right upper belly pain; unusually weak or tired; yellowing of the eyes or skin  swelling of the ankles, feet, hands  trouble passing urine or change in the amount of urine  unusually weak or tired  weight gain or loss Side effects that usually do not require medical attention (report to your doctor or health care professional if they continue or are bothersome):  bone pain  constipation  decreased appetite  diarrhea  muscle pain  nausea, vomiting  tiredness This list may not describe all possible side effects. Call your doctor for medical advice about side effects. You may report side effects to FDA at 1-800-FDA-1088. Where should I keep my medicine? This drug is given in a hospital or clinic and will not be stored at home. NOTE: This sheet is a summary. It may not cover all possible information. If you have questions about this medicine, talk to your doctor, pharmacist, or health care provider.  2021 [employer]/Gold Standard (2019-05-29 10:08:25)  Ipilimumab injection What is this medicine? IPILIMUMAB (IP i LIM ue mab) is a monoclonal antibody. It is used to treat colorectal cancer, kidney cancer, liver cancer, lung cancer, melanoma, and mesothelioma. This medicine may be used for other purposes; ask your health care provider or pharmacist if you have questions. COMMON BRAND NAME(S): YERVOY What should I tell my health care provider before I take this medicine? They need to know if you have any of these conditions:  autoimmune diseases like Crohn's disease, ulcerative colitis, or lupus  have had or planning to have an allogeneic stem cell transplant (uses someone else's stem cells)  history of organ transplant  nervous system problems like myasthenia gravis or Guillain-Barre syndrome  an unusual or allergic  reaction to ipilimumab, other medicines, foods, dyes, or preservatives  pregnant or trying to get pregnant  breast-feeding How should I use this medicine? This medicine is for infusion into a vein. It is given by a health care professional in a hospital or clinic setting. A special MedGuide will be given to you before each treatment. Be sure to read this information carefully each time. Talk to your pediatrician regarding the use of this medicine in children. While this drug may be prescribed for children as young as 12 years for selected conditions, precautions do apply. Overdosage: If you think you have taken too much of this medicine contact a poison control center or emergency room at once. NOTE: This medicine is only for you. Do not share this medicine with others. What if I miss a dose? It is important not to miss your dose. Call your doctor or health care professional if you are unable to keep an appointment. What may interact with this medicine? Interactions are not expected. This list may not describe all possible interactions. Give your health care provider a list of all the medicines, herbs, non-prescription drugs, or dietary supplements you use. Also tell them if you smoke, drink alcohol, or use illegal drugs. Some items may interact with your medicine. What should I watch for while using this medicine? Tell  your doctor or healthcare professional if your symptoms do not start to get better or if they get worse. Do not become pregnant while taking this medicine or for 3 months after stopping it. Women should inform their doctor if they wish to become pregnant or think they might be pregnant. There is a potential for serious side effects to an unborn child. Talk to your health care professional or pharmacist for more information. Do not breast-feed an infant while taking this medicine or for 3 months after the last dose. Your condition will be monitored carefully while you are receiving  this medicine. You may need blood work done while you are taking this medicine. What side effects may I notice from receiving this medicine? Side effects that you should report to your doctor or health care professional as soon as possible:  allergic reactions like skin rash, itching or hives, swelling of the face, lips, or tongue  black, tarry stools  bloody or watery diarrhea  changes in vision  dizziness  eye pain  fast, irregular heartbeat  feeling anxious  feeling faint or lightheaded, falls  nausea, vomiting  pain, tingling, numbness in the hands or feet  redness, blistering, peeling or loosening of the skin, including inside the mouth  signs and symptoms of liver injury like dark yellow or brown urine; general ill feeling or flu-like symptoms; light-colored stools; loss of appetite; nausea; right upper belly pain; unusually weak or tired; yellowing of the eyes or skin  unusual bleeding or bruising Side effects that usually do not require medical attention (report to your doctor or health care professional if they continue or are bothersome):  headache  loss of appetite  trouble sleeping This list may not describe all possible side effects. Call your doctor for medical advice about side effects. You may report side effects to FDA at 1-800-FDA-1088. Where should I keep my medicine? This drug is given in a hospital or clinic and will not be stored at home. NOTE: This sheet is a summary. It may not cover all possible information. If you have questions about this medicine, talk to your doctor, pharmacist, or health care provider.  2021 [employer]vier/Gold Standard (2018-12-26 18:53:00)

## 2020-05-29 NOTE — Telephone Encounter (Signed)
Pt. Called stating he needs a refill on Dronabinol [facility] [address] Pt. Last apt was 04/29/20.

## 2020-05-29 NOTE — Telephone Encounter (Signed)
Pt called requesting dronabinol. I think this may come from his oncology doctor. Please advise [facility]

## 2020-05-29 NOTE — Progress Notes (Signed)
Ipilimumab (YERVOY) Patient Monitoring Assessment   Is the patient experiencing any of the following general symptoms?:  [] Difficulty performing normal activities [] Feeling sluggish or cold all the time [] Unusual weight gain [] Constant or unusual headaches [] Feeling dizzy or faint [] Changes in eyesight (blurry vision, double vision, or other vision problems) [] Changes in mood or behavior (ex: decreased sex drive, irritability, or forgetfulness) [] Starting new medications (ex: steroids, other medications that lower immune response) [x] Patient is not experiencing any of the general symptoms above.    Gastrointestinal  Patient is having 1 bowel movements each day.  Is this different from baseline? [] Yes [x] No Are your stools watery or do they have a foul smell? [] Yes [x] No Have you seen blood in your stools? [] Yes [x] No Are your stools dark, tarry, or sticky? [] Yes [x] No Are you having pain or tenderness in your belly? [] Yes [x] No  Skin Does your skin itch? [] Yes [x] No Do you have a rash? [] Yes [x] No Has your skin blistered and/or peeled? [] Yes [x] No Do you have sores in your mouth? [] Yes [x] No  Hepatic Has your urine been dark or tea colored? [] Yes [x] No Have you noticed that your skin or the whites of your eyes are turning yellow? [] Yes [x] No Are you bleeding or bruising more easily than normal? [] Yes [x] No Are you nauseous and/or vomiting? [] Yes [x] No Do you have pain on the right side of your stomach? [] Yes [x] No  Neurologic  Are you having unusual weakness of legs, arms, or face? [] Yes [x] No Are you having numbness or tingling in your hands or feet? [x] Yes [] No  Shaun Kennedy  Dr. Julien Nordmann is aware of the changes listed above as these changes are not new.

## 2020-06-01 LAB — CULTURE, BLOOD (ROUTINE X 2)
Culture: NO GROWTH
Culture: NO GROWTH
Special Requests: ADEQUATE

## 2020-06-05 DIAGNOSIS — Z930 Tracheostomy status: Secondary | ICD-10-CM | POA: Diagnosis not present

## 2020-06-05 DIAGNOSIS — J386 Stenosis of larynx: Secondary | ICD-10-CM | POA: Diagnosis not present

## 2020-06-08 ENCOUNTER — Other Ambulatory Visit: Payer: Self-pay

## 2020-06-08 NOTE — Patient Outreach (Addendum)
[facility] ([facility] Care Management  06/08/2020  Shaun Kennedy 01/05/1941 [id number]   Telephone Assessment   Successful outreach to patient. Spoke with spouse as well as other caregiver-Angie. They both voice that patient is doing well. He is currently sitting outside drinking a cup of coffee. They deny any acute issues or concerns at this time. Patient went to see oncologist recently. He will resume chemo txs soon. Caregiver voices that patient tolerated txs fairly well last time. Patient was asked regarding SOB during call and RN CM heard back in background deny any issues. Both caregivers verbalize that trach is doing well and no issues. They voice that they are able to manage care. Angei states that patient has bene set up with appt at [facility]" and they are awaiting to hear back from them. They voice that the goal remains to keep patient in the home and provide care to him there. No RN CM needs or concerns voiced at this time.    Medications Reviewed Today    Reviewed by ,  Jeanette, RN (Registered Nurse) on 06/08/20 at 0901  Med List Status: <None>  Medication Order Taking? Sig Documenting Provider Last Dose Status Informant  acetaminophen (TYLENOL) 500 MG tablet [id number] No Take 1,000 mg by mouth every 4 (four) hours as needed. [provider] Taking Active Self  amLODipine (NORVASC) 5 MG tablet [id number] No Take 1 tablet (5 mg total) by mouth daily. Lalonde, John C, MD Taking Active Spouse/Significant Other  ANORO ELLIPTA 62.5-25 MCG/INH AEPB [id number] No Inhale 1 puff into the lungs daily. [provider] Taking Active   aspirin EC 81 MG tablet [id number] No Take 81 mg by mouth daily after breakfast.  [provider] Taking Active Spouse/Significant Other  Blood Glucose Monitoring Suppl (ONETOUCH VERIO) w/Device KIT [id number] No USE TO CHECK SUGAR DAILY Lalonde, John C, MD Taking Active Spouse/Significant Other  ciprofloxacin  (CIPRO) 500 MG tablet [id number] No Take 500 mg by mouth 2 (two) times daily. [provider] Taking Active   doxycycline (VIBRAMYCIN) 100 MG capsule [id number] No Take 1 capsule (100 mg total) by mouth 2 (two) times daily. Haviland, Julie, MD Taking Active   dronabinol (MARINOL) 2.5 MG capsule [id number] No Take 1 capsule (2.5 mg total) by mouth 2 (two) times daily before a meal. Lalonde, John C, MD Taking Active   ferrous sulfate 325 (65 FE) MG EC tablet [id number] No Take 1 tablet (325 mg total) by mouth daily with breakfast. Lalonde, John C, MD Taking Active Spouse/Significant Other  HYDROcodone-acetaminophen (NORCO/VICODIN) 5-325 MG tablet [id number] No Take 1 tablet by mouth every 4 (four) hours as needed. Haviland, Julie, MD Taking Active   Lancets (ONETOUCH DELICA PLUS LANCET33G) MISC [id number] No PATIENT IS TO TEST TWO TIMES A DAY DX: E11.9 (ONETOUCH VERIO FLEX) Lalonde, John C, MD Taking Active Spouse/Significant Other  metFORMIN (GLUCOPHAGE-XR) 500 MG 24 hr tablet [id number] No Take 1 tablet (500 mg total) by mouth daily. Lalonde, John C, MD Taking Active Spouse/Significant Other  methylPREDNISolone (MEDROL DOSEPAK) 4 MG TBPK tablet [id number] No Use as instructed Mohamed, Mohamed, MD Taking Active   Multiple Vitamin (MULTIVITAMIN WITH MINERALS) TABS tablet [id number] No Take 1 tablet by mouth daily after breakfast.  [provider] Taking Active Spouse/Significant Other  polyethylene glycol (MIRALAX / GLYCOLAX) 17 g packet [id number] No Take 17 g by mouth daily as needed for moderate constipation.  Patient not taking: Reported on 05/29/2020   Samtani, Jai-Gurmukh, MD Not Taking Active     [facility]) Care Management  06/08/2020  THADDEUS EVITTS Jun 08, 1940 [id number]   Telephone Assessment   Successful outreach to patient. Spoke with spouse as well as other caregiver-Angie. They both voice that patient is doing well. He is currently sitting outside drinking a cup of coffee. They deny any acute issues or concerns at this time. Patient went to see oncologist recently. He will resume chemo txs soon. Caregiver voices that patient tolerated txs fairly well last time. Patient was asked regarding SOB during call and RN CM heard back in background deny any issues. Both caregivers verbalize that trach is doing well and no issues. They voice that they are able to manage care. Angei states that patient has bene set up with appt at [facility]" and they are awaiting to hear back from them. They voice that the goal remains to keep patient in the home and provide care to him there. No RN CM needs or concerns voiced at this time.    Medications Reviewed Today    Reviewed by [name], RN (Registered Nurse) on 06/08/20 at [place] List Status: <None>  Medication Order Taking? Sig Documenting Provider Last Dose Status Informant  acetaminophen (TYLENOL) 500 MG tablet [id number] No Take 1,000 mg by mouth every 4 (four) hours as needed. [provider] Taking Active Self  amLODipine (NORVASC) 5 MG tablet [id number] No Take 1 tablet (5 mg total) by mouth daily. [name], MD Taking Active Spouse/Significant Other  ANORO ELLIPTA 62.5-25 MCG/INH AEPB [id number] No Inhale 1 puff into the lungs daily. [provider] Taking Active   aspirin EC 81 MG tablet [id number] No Take 81 mg by mouth daily after breakfast.  [provider] Taking Active Spouse/Significant Other  Blood Glucose Monitoring Suppl [facility] VERIO) w/Device KIT [id number] No USE TO CHECK SUGAR DAILY [name], MD Taking Active Spouse/Significant Other  ciprofloxacin  (CIPRO) 500 MG tablet [id number] No Take 500 mg by mouth 2 (two) times daily. [provider] Taking Active   doxycycline (VIBRAMYCIN) 100 MG capsule [id number] No Take 1 capsule (100 mg total) by mouth 2 (two) times daily. Isla Pence, MD Taking Active   dronabinol (MARINOL) 2.5 MG capsule [id number] No Take 1 capsule (2.5 mg total) by mouth 2 (two) times daily before a meal. [name], MD Taking Active   ferrous sulfate 325 (65 FE) MG EC tablet [id number] No Take 1 tablet (325 mg total) by mouth daily with breakfast. [name], MD Taking Active Spouse/Significant Other  HYDROcodone-acetaminophen (NORCO/VICODIN) 5-325 MG tablet [id number] No Take 1 tablet by mouth every 4 (four) hours as needed. Isla Pence, MD Taking Active   Lancets (ONETOUCH DELICA PLUS [id number]) [place] [id number] No PATIENT IS TO TEST TWO TIMES A DAY DX: E11.9 (ONETOUCH VERIO FLEX) [name], MD Taking Active Spouse/Significant Other  metFORMIN (GLUCOPHAGE-XR) 500 MG 24 hr tablet [id number] No Take 1 tablet (500 mg total) by mouth daily. [name], MD Taking Active Spouse/Significant Other  methylPREDNISolone (MEDROL DOSEPAK) 4 MG TBPK tablet [id number] No Use as instructed Curt Bears, MD Taking Active   Multiple Vitamin (MULTIVITAMIN WITH MINERALS) TABS tablet [id number] No Take 1 tablet by mouth daily after breakfast.  [provider] Taking Active Spouse/Significant Other  polyethylene glycol (MIRALAX / GLYCOLAX) 17 g packet [id number] No Take 17 g by mouth daily as needed for moderate constipation.  Patient not taking: Reported on 05/29/2020   Nita Sells, MD Not Taking Active  [facility] ([facility] Care Management  06/08/2020  Shaun Kennedy 01/05/1941 [id number]   Telephone Assessment   Successful outreach to patient. Spoke with spouse as well as other caregiver-Angie. They both voice that patient is doing well. He is currently sitting outside drinking a cup of coffee. They deny any acute issues or concerns at this time. Patient went to see oncologist recently. He will resume chemo txs soon. Caregiver voices that patient tolerated txs fairly well last time. Patient was asked regarding SOB during call and RN CM heard back in background deny any issues. Both caregivers verbalize that trach is doing well and no issues. They voice that they are able to manage care. Angei states that patient has bene set up with appt at "[facility]" and they are awaiting to hear back from them. They voice that the goal remains to keep patient in the home and provide care to him there. No RN CM needs or concerns voiced at this time.    Medications Reviewed Today    Reviewed by ,  Jeanette, RN (Registered Nurse) on 06/08/20 at 0901  Med List Status: <None>  Medication Order Taking? Sig Documenting Provider Last Dose Status Informant  acetaminophen (TYLENOL) 500 MG tablet [id number] No Take 1,000 mg by mouth every 4 (four) hours as needed. [provider] Taking Active Self  amLODipine (NORVASC) 5 MG tablet [id number] No Take 1 tablet (5 mg total) by mouth daily. Lalonde, John C, MD Taking Active Spouse/Significant Other  ANORO ELLIPTA 62.5-25 MCG/INH AEPB [id number] No Inhale 1 puff into the lungs daily. [provider] Taking Active   aspirin EC 81 MG tablet [id number] No Take 81 mg by mouth daily after breakfast.  [provider] Taking Active Spouse/Significant Other  Blood Glucose Monitoring Suppl (ONETOUCH VERIO) w/Device KIT [id number] No USE TO CHECK SUGAR DAILY Lalonde, John C, MD Taking Active Spouse/Significant Other  ciprofloxacin  (CIPRO) 500 MG tablet [id number] No Take 500 mg by mouth 2 (two) times daily. [provider] Taking Active   doxycycline (VIBRAMYCIN) 100 MG capsule [id number] No Take 1 capsule (100 mg total) by mouth 2 (two) times daily. Haviland, Julie, MD Taking Active   dronabinol (MARINOL) 2.5 MG capsule [id number] No Take 1 capsule (2.5 mg total) by mouth 2 (two) times daily before a meal. Lalonde, John C, MD Taking Active   ferrous sulfate 325 (65 FE) MG EC tablet [id number] No Take 1 tablet (325 mg total) by mouth daily with breakfast. Lalonde, John C, MD Taking Active Spouse/Significant Other  HYDROcodone-acetaminophen (NORCO/VICODIN) 5-325 MG tablet [id number] No Take 1 tablet by mouth every 4 (four) hours as needed. Haviland, Julie, MD Taking Active   Lancets (ONETOUCH DELICA PLUS LANCET33G) MISC [id number] No PATIENT IS TO TEST TWO TIMES A DAY DX: E11.9 (ONETOUCH VERIO FLEX) Lalonde, John C, MD Taking Active Spouse/Significant Other  metFORMIN (GLUCOPHAGE-XR) 500 MG 24 hr tablet [id number] No Take 1 tablet (500 mg total) by mouth daily. Lalonde, John C, MD Taking Active Spouse/Significant Other  methylPREDNISolone (MEDROL DOSEPAK) 4 MG TBPK tablet [id number] No Use as instructed Mohamed, Mohamed, MD Taking Active   Multiple Vitamin (MULTIVITAMIN WITH MINERALS) TABS tablet [id number] No Take 1 tablet by mouth daily after breakfast.  [provider] Taking Active Spouse/Significant Other  polyethylene glycol (MIRALAX / GLYCOLAX) 17 g packet [id number] No Take 17 g by mouth daily as needed for moderate constipation.  Patient not taking: Reported on 05/29/2020   Samtani, Jai-Gurmukh, MD Not Taking Active     [facility]) Care Management  06/08/2020  THADDEUS EVITTS Jun 08, 1940 [id number]   Telephone Assessment   Successful outreach to patient. Spoke with spouse as well as other caregiver-Angie. They both voice that patient is doing well. He is currently sitting outside drinking a cup of coffee. They deny any acute issues or concerns at this time. Patient went to see oncologist recently. He will resume chemo txs soon. Caregiver voices that patient tolerated txs fairly well last time. Patient was asked regarding SOB during call and RN CM heard back in background deny any issues. Both caregivers verbalize that trach is doing well and no issues. They voice that they are able to manage care. Angei states that patient has bene set up with appt at [facility]" and they are awaiting to hear back from them. They voice that the goal remains to keep patient in the home and provide care to him there. No RN CM needs or concerns voiced at this time.    Medications Reviewed Today    Reviewed by [name], RN (Registered Nurse) on 06/08/20 at [place] List Status: <None>  Medication Order Taking? Sig Documenting Provider Last Dose Status Informant  acetaminophen (TYLENOL) 500 MG tablet [id number] No Take 1,000 mg by mouth every 4 (four) hours as needed. [provider] Taking Active Self  amLODipine (NORVASC) 5 MG tablet [id number] No Take 1 tablet (5 mg total) by mouth daily. [name], MD Taking Active Spouse/Significant Other  ANORO ELLIPTA 62.5-25 MCG/INH AEPB [id number] No Inhale 1 puff into the lungs daily. [provider] Taking Active   aspirin EC 81 MG tablet [id number] No Take 81 mg by mouth daily after breakfast.  [provider] Taking Active Spouse/Significant Other  Blood Glucose Monitoring Suppl [facility] VERIO) w/Device KIT [id number] No USE TO CHECK SUGAR DAILY [name], MD Taking Active Spouse/Significant Other  ciprofloxacin  (CIPRO) 500 MG tablet [id number] No Take 500 mg by mouth 2 (two) times daily. [provider] Taking Active   doxycycline (VIBRAMYCIN) 100 MG capsule [id number] No Take 1 capsule (100 mg total) by mouth 2 (two) times daily. Isla Pence, MD Taking Active   dronabinol (MARINOL) 2.5 MG capsule [id number] No Take 1 capsule (2.5 mg total) by mouth 2 (two) times daily before a meal. [name], MD Taking Active   ferrous sulfate 325 (65 FE) MG EC tablet [id number] No Take 1 tablet (325 mg total) by mouth daily with breakfast. [name], MD Taking Active Spouse/Significant Other  HYDROcodone-acetaminophen (NORCO/VICODIN) 5-325 MG tablet [id number] No Take 1 tablet by mouth every 4 (four) hours as needed. Isla Pence, MD Taking Active   Lancets (ONETOUCH DELICA PLUS [id number]) [place] [id number] No PATIENT IS TO TEST TWO TIMES A DAY DX: E11.9 (ONETOUCH VERIO FLEX) [name], MD Taking Active Spouse/Significant Other  metFORMIN (GLUCOPHAGE-XR) 500 MG 24 hr tablet [id number] No Take 1 tablet (500 mg total) by mouth daily. [name], MD Taking Active Spouse/Significant Other  methylPREDNISolone (MEDROL DOSEPAK) 4 MG TBPK tablet [id number] No Use as instructed Curt Bears, MD Taking Active   Multiple Vitamin (MULTIVITAMIN WITH MINERALS) TABS tablet [id number] No Take 1 tablet by mouth daily after breakfast.  [provider] Taking Active Spouse/Significant Other  polyethylene glycol (MIRALAX / GLYCOLAX) 17 g packet [id number] No Take 17 g by mouth daily as needed for moderate constipation.  Patient not taking: Reported on 05/29/2020   Nita Sells, MD Not Taking Active

## 2020-06-10 ENCOUNTER — Other Ambulatory Visit: Payer: Medicare HMO

## 2020-06-10 ENCOUNTER — Ambulatory Visit: Payer: Medicare HMO | Admitting: Physician Assistant

## 2020-06-10 ENCOUNTER — Ambulatory Visit: Payer: Medicare HMO

## 2020-06-13 NOTE — Progress Notes (Signed)
[facility] OFFICE PROGRESS NOTE  [name], [place] [address]  DIAGNOSIS: Stage IV (T3, N2, M1 a) non-small cell lung cancer, squamous cell carcinoma presented with obstructive left lower lobe lung mass in addition to mediastinal lymphadenopathy as well as bilateral pulmonary nodules diagnosed in July 2021.  PDL1:0%  PRIOR THERAPY: Palliative radiotherapy to the obstructive left lower lobe lung mass under the care of Dr. Lisbeth Renshaw.Completed on 09/20/19  CURRENT THERAPY: Systemic chemotherapy 2 cycles of chemotherapy with carboplatin for an AUC of5and paclitaxel175 mg/m2in addition to immunotherapy with nivolumab360 mgevery 3 weeks and ipilimumab1 mg/kgIV every 6 weeksfollowed by maintenance nivolumab and ipilimumab.He started the first treatment on 09/04/2019. He is status postDay1cycle #7of the treatment.  INTERVAL HISTORY: DUTCH ING 80 y.o. male returns to the clinic today for a follow up visit. The patient is feeling well today without any concerning complaints except for constipation. He uses Senakot twice a day and sometimes uses miralax. His last bowel movement was Sunday. He also has been trying to walk more. He continues to have his tracheostomy for which he was having issues with prior but none recently. He is followed by ENT. The patient continues to tolerate treatment with immunotherapy well without any adverse effects. Denies any fever, chills, night sweats, or weight loss. Denies any chest pain, cough, or hemoptysis. He has some shortness of breath and cough associated with his tracheostomy tube and has a hard time being comfortable with this. Denies any nausea, vomiting, or diarrhea. Denies any headache or visual changes. Denies any rashes or skin changes. The patient is here today for evaluation prior to starting cycle # 7   MEDICAL HISTORY: Past Medical History:  Diagnosis Date  . Allergic rhinitis   . Asthma    . Carotid stenosis   . Colon cancer ([place]) 2003  . Diabetes mellitus ([place])   . Diverticulosis   . Dyslipidemia   . ED (erectile dysfunction)   . GERD (gastroesophageal reflux disease)   . H/O degenerative disc disease   . Hemorrhoids   . HTN (hypertension)   . Hyperlipidemia   . Lung cancer ([place])   . LVH (left ventricular hypertrophy)    on EKG  . Mass of lower lobe of left lung   . Mediastinal adenopathy   . Smoker    former  . Wears dentures   . Wears dentures   . Wears glasses   . Wears glasses     ALLERGIES:  has No Known Allergies.  MEDICATIONS:  Current Outpatient Medications  Medication Sig Dispense Refill  . acetaminophen (TYLENOL) 500 MG tablet Take 1,000 mg by mouth every 4 (four) hours as needed.    Marland Kitchen amLODipine (NORVASC) 5 MG tablet Take 1 tablet (5 mg total) by mouth daily. 90 tablet 3  . ANORO ELLIPTA 62.5-25 MCG/INH AEPB Inhale 1 puff into the lungs daily.    Marland Kitchen aspirin EC 81 MG tablet Take 81 mg by mouth daily after breakfast.     . Blood Glucose Monitoring Suppl (ONETOUCH VERIO) w/Device KIT USE TO CHECK SUGAR DAILY 1 kit 0  . ciprofloxacin (CIPRO) 500 MG tablet Take 500 mg by mouth 2 (two) times daily.    Marland Kitchen doxycycline (VIBRAMYCIN) 100 MG capsule Take 1 capsule (100 mg total) by mouth 2 (two) times daily. 14 capsule 0  . dronabinol (MARINOL) 2.5 MG capsule Take 1 capsule (2.5 mg total) by mouth 2 (two) times daily before a meal. 60 capsule  0  . ferrous sulfate 325 (65 FE) MG EC tablet Take 1 tablet (325 mg total) by mouth daily with breakfast. 30 tablet 12  . HYDROcodone-acetaminophen (NORCO/VICODIN) 5-325 MG tablet Take 1 tablet by mouth every 4 (four) hours as needed. 10 tablet 0  . Lancets (ONETOUCH DELICA PLUS [id number]) MISC PATIENT IS TO TEST TWO TIMES A DAY DX: E11.9 (ONETOUCH VERIO FLEX) 100 each 12  . metFORMIN (GLUCOPHAGE-XR) 500 MG 24 hr tablet Take 1 tablet (500 mg total) by mouth daily. 90 tablet 1  . methylPREDNISolone (MEDROL DOSEPAK) 4 MG  TBPK tablet Use as instructed 21 tablet 0  . Multiple Vitamin (MULTIVITAMIN WITH MINERALS) TABS tablet Take 1 tablet by mouth daily after breakfast.     . polyethylene glycol (MIRALAX / GLYCOLAX) 17 g packet Take 17 g by mouth daily as needed for moderate constipation. (Patient not taking: Reported on 05/29/2020) 14 each 0  . senna-docusate (SENOKOT-S) 8.6-50 MG tablet Take 2 tablets by mouth 2 (two) times daily. (Patient not taking: Reported on 05/29/2020) 30 tablet 0  . solifenacin (VESICARE) 5 MG tablet Take 1 tablet (5 mg total) by mouth daily. 90 tablet 1   No current facility-administered medications for this visit.   Facility-Administered Medications Ordered in Other Visits  Medication Dose Route Frequency Provider Last Rate Last Admin  . sodium chloride flush (NS) 0.9 % injection 10 mL  10 mL Intracatheter PRN Curt Bears, MD   10 mL at 09/04/19 1556    SURGICAL HISTORY:  Past Surgical History:  Procedure Laterality Date  . BRONCHIAL BIOPSY  08/20/2019   Procedure: BRONCHIAL BIOPSIES;  Surgeon: Collene Gobble, MD;  Location: [facility] ENDOSCOPY;  Service: Pulmonary;;  . BRONCHIAL BRUSHINGS  08/20/2019   Procedure: BRONCHIAL BRUSHINGS;  Surgeon: Collene Gobble, MD;  Location: [facility] ENDOSCOPY;  Service: Pulmonary;;  . BRONCHIAL NEEDLE ASPIRATION BIOPSY  08/20/2019   Procedure: BRONCHIAL NEEDLE ASPIRATION BIOPSIES;  Surgeon: Collene Gobble, MD;  Location[facility]COPY;  Service: Pulmonary;;  . CARPAL TUNNEL RELEASE Right 01/09/2019   Procedure: CARPAL TUNNEL RELEASE;  Surgeon: Leandrew Koyanagi, MD;  Location: [facility];  Service: Orthopedics;  Laterality: Right;  . CATARACT EXTRACTION Right 2018  . COLONOSCOPY  2007   Dr. Benson Norway  . DIRECT LARYNGOSCOPY N/A 04/10/2020   Procedure: DIRECT LARYNGOSCOPY;  Surgeon: Melida Quitter, MD;  Location: [facility];  Service: ENT;  Laterality: N/A;  . I & D EXTREMITY Right 04/02/2016   Procedure: IRRIGATION AND DEBRIDEMENT GREAT TOE;  Surgeon: Leandrew Koyanagi, MD;  Location: [facility];  Service: Orthopedics;  Laterality: Right;  . IR IMAGING GUIDED PORT INSERTION  08/30/2019  . MULTIPLE TOOTH EXTRACTIONS    . RADIOACTIVE SEED IMPLANT  2003  . TRACHEOSTOMY TUBE PLACEMENT N/A 04/10/2020   Procedure: TRACHEOSTOMY;  Surgeon: Melida Quitter, MD;  Location: [facility];  Service: ENT;  Laterality: N/A;  . ULNAR TUNNEL RELEASE Right 01/09/2019   Procedure: RIGHT CUBITAL TUNNEL RELEASE AND CARPAL TUNNEL RELEASE;  Surgeon: Leandrew Koyanagi, MD;  Location: [facility];  Service: Orthopedics;  Laterality: Right;  . UPPER GASTROINTESTINAL ENDOSCOPY    . VIDEO BRONCHOSCOPY N/A 12/18/2019   Procedure: VIDEO BRONCHOSCOPY WITHOUT FLUORO;  Surgeon: Collene Gobble, MD;  Location: [facility] ENDOSCOPY;  Service: Cardiopulmonary;  Laterality: N/A;  . VIDEO BRONCHOSCOPY WITH ENDOBRONCHIAL ULTRASOUND N/A 08/20/2019   Procedure: VIDEO BRONCHOSCOPY WITH ENDOBRONCHIAL ULTRASOUND;  Surgeon: Collene Gobble, MD;  Location: [facility] ENDOSCOPY;  Service: Pulmonary;  Laterality: N/A;    REVIEW OF SYSTEMS:   Constitutional: Negative for appetite change, chills, fatigue, fever and unexpected weight change.  HENT: Negative for mouth sores, nosebleeds, sore throat and trouble swallowing.Positive for stidor. Eyes: Negative for eye problems and icterus.  Respiratory:Negative for significant cough, hemoptysis, and wheezing.  Cardiovascular: Negative for chest pain and leg swelling.  Gastrointestinal:Positive for constipation.Negative for abdominal pain, diarrhea, nausea and vomiting.  Genitourinary: Negative for bladder incontinence, difficulty urinating, dysuria, frequency and hematuria.  Musculoskeletal: Negative for back pain, gait problem, neck pain and neck stiffness.  Skin: Negative for itching and rash.  Neurological: Negative for dizziness, extremity weakness, gait problem, headaches, light-headedness and seizures.  Hematological: Negative for adenopathy. Does not bruise/bleed  easily.  Psychiatric/Behavioral: Negative for confusion, depression and sleep disturbance. The patient is not nervous/anxious.    PHYSICAL EXAMINATION:  Blood pressure 129/66, pulse 75, temperature (!) 97.5 F (36.4 C), temperature source Tympanic, resp. rate 17, weight 173 lb 9.6 oz (78.7 kg), SpO2 100 %.  ECOG PERFORMANCE STATUS: 1 - Symptomatic but completely ambulatory  Physical Exam  Constitutional: Oriented to person, place, and time and well-developed, well-nourished, and in no distress. No distress.  HENT:  Head: Normocephalic and atraumatic.  Mouth/Throat: Oropharynx is clear and moist. No oropharyngeal exudate.  Eyes: Conjunctivae are normal. Right eye exhibits no discharge. Left eye exhibits no discharge. No scleral icterus.  Neck: Normal range of motion. Neck supple. Tracheostomy in place.  Cardiovascular: Normal rate, regular rhythm, normal heart sounds and intact distal pulses.   Pulmonary/Chest: Effort normal and breath sounds normal. No respiratory distress. No wheezes. No rales.  Abdominal: Soft. Bowel sounds are normal. Exhibits no distension and no mass. There is no tenderness.  Musculoskeletal: Normal range of motion. Exhibits no edema.  Lymphadenopathy:    No cervical adenopathy.  Neurological: Alert and oriented to person, place, and time. Exhibits normal muscle tone. Gait normal. Coordination normal.  Skin: Skin is warm and dry. No rash noted. Not diaphoretic. No erythema. No pallor.  Psychiatric: Mood, memory and judgment normal.  Vitals reviewed.  LABORATORY DATA: Lab Results  Component Value Date   WBC 5.3 06/17/2020   HGB 9.3 (L) 06/17/2020   HCT 28.2 (L) 06/17/2020   MCV 94.9 06/17/2020   PLT 270 06/17/2020      Chemistry      Component Value Date/Time   NA 141 06/17/2020 0954   NA 132 (L) 08/14/2019 0918   K 3.5 06/17/2020 0954   CL 105 06/17/2020 0954   CO2 28 06/17/2020 0954   BUN 16 06/17/2020 0954   BUN 16 08/14/2019 0918   CREATININE  1.13 06/17/2020 0954   CREATININE 1.25 (H) 08/08/2016 0912      Component Value Date/Time   CALCIUM 9.3 06/17/2020 0954   ALKPHOS 57 06/17/2020 0954   AST 18 06/17/2020 0954   ALT 13 06/17/2020 0954   BILITOT 0.6 06/17/2020 0954       RADIOGRAPHIC STUDIES:  DG Chest 2 View  Result Date: 05/27/2020 CLINICAL DATA:  Shortness of breath EXAM: CHEST - 2 VIEW COMPARISON:  05/19/2020.  CT 05/25/2020 FINDINGS: Large masslike opacity again noted in the left perihilar region/upper lobe shown on prior CT to represent architectural distortion and probable post treatment changes. Right lung is clear. Heart is normal size. No effusions. Right Port-A-Cath and tracheostomy remain in place, unchanged. IMPRESSION: Left upper lobe masslike opacity, likely post treatment changes as seen on prior CT. No acute cardiopulmonary disease. Electronically Signed  By: Rolm Baptise M.D.   On: 05/27/2020 17:47   DG Chest 2 View  Result Date: 05/19/2020 CLINICAL DATA:  Worsening shortness of breath for 1 day. History of left lung cancer treated with chemotherapy. Ex-smoker. EXAM: CHEST - 2 VIEW COMPARISON:  04/19/2020 and chest CT dated 04/19/2020 FINDINGS: Irregular, cavitary appearing mass-like area lateral to the upper aorta, corresponding to the patient's known malignancy. This is denser and more defined than seen on radiographs dated 04/19/2020 and more cavitary in appearance than seen on the CT. In the frontal projection, this measures approximate 6.6 x 4.5 cm. On the lateral view, this measures approximately 8.0 cm in AP diameter. Clear right lung. Tracheostomy in satisfactory position. Right jugular porta catheter tip in the superior vena cava. Unremarkable bones. IMPRESSION: Irregular, cavitary appearing mass-like area lateral to the upper aorta, corresponding to the patient's known malignancy. This is denser and more mass-like than seen radiographically on 04/19/2020 and more cavitary than seen on the CT on that  day. Electronically Signed   By: Claudie Revering M.D.   On: 05/19/2020 15:27   CT CHEST W CONTRAST  Result Date: 05/25/2020 CLINICAL DATA:  Unintended weight loss, history of lung cancer and tracheostomy EXAM: CT CHEST WITH CONTRAST TECHNIQUE: Multidetector CT imaging of the chest was performed during intravenous contrast administration. CONTRAST:  65m ISOVUE-300 IOPAMIDOL (ISOVUE-300) INJECTION 61% COMPARISON:  04/19/2020 FINDINGS: Cardiovascular: Right chest port catheter. Aortic atherosclerosis. Normal heart size. Extensive 3 vessel coronary artery calcifications. No pericardial effusion. Mediastinum/Nodes: No significant change in enlarged subcarinal lymph nodes measuring up to 2.7 x 1.7 cm (series 2, image 72). Unchanged soft tissue thickening about the left hilum and AP window. Tracheostomy. Thyroid gland and esophagus demonstrate no significant findings. Lungs/Pleura: Mild centrilobular and paraseptal emphysema. No significant interval change in fibrosis, consolidation, volume loss, and architectural distortion of the perihilar left lung as well as the paramedian left upper lobe and superior segment left lower lobe. There has however been significant interval improvement in clustered centrilobular and tree-in-bud nodularity in the dependent left lung base, almost completely resolved (series 5, image 104). Resolved consolidation of the dependent right lower lobe. Additional paramedian fibrosis and consolidation of the right lung. Multiple unchanged small pulmonary nodules, for example a 6 mm nodule of the anterior right middle lobe (series 5, image 97) and a 6 mm nodule of the posterior left upper lobe (series 5, image 41). No pleural effusion or pneumothorax. Upper Abdomen: No acute abnormality. Musculoskeletal: No chest wall mass or suspicious bone lesions identified. IMPRESSION: 1. No significant interval change post treatment fibrosis and consolidation of the perihilar left lung as well as the paramedian  left upper lobe and superior segment left lower lobe. Additional paramedian fibrosis and consolidation of the right lung. 2. Unchanged enlarged subcarinal lymph nodes as well as soft tissue thickening about the left hilum and AP window. 3. There has been significant interval improvement in clustered centrilobular and tree-in-bud nodularity in the dependent left lung base, almost completely resolved. Additionally resolved consolidation of the dependent right lower lobe. Findings are consistent with resolution of infection or aspiration. 4. Multiple unchanged small pulmonary nodules, which remain nonspecific. Continued attention on follow-up. 5. Emphysema. 6. Coronary artery disease. Aortic Atherosclerosis (ICD10-I70.0) and Emphysema (ICD10-J43.9). Electronically Signed   By: AEddie CandleM.D.   On: 05/25/2020 16:27     ASSESSMENT/PLAN:  This is a very pleasant 80year old [name] diagnosed with stage IV (T3, N2, M1a) non-small cell lung cancer, squamous cell carcinoma.  He presented with a left lower lobe obstructive lung mass in addition to mediastinal lymphadenopathy. He also presented with bilateral pulmonary nodules. He was diagnosed in July 2021. His PD-L1 expression is negative.  Hecompletedpalliative radiotherapy to the obstructive lung mass under the care of Dr. Lisbeth Renshaw in August 2021.  The patient is currently undergoing systemic chemotherapy with carboplatin and paclitaxel for 2 cycles in addition to immunotherapy with ipilimumab 1 mg/KG every 6 weeks and nivolumab 360 mg IV every 3 weeks.He is status post day1of cycle7   Labs were reviewed. Recommend that he proceed with day 22 cycle #7 today as scheduled.   We will see him back for a follow up visit in 3 weeks for evaluation before starting cycle #1 day 8.   He will continue to follow with ENT for his tracheostomy.   Reviewed constipation education with the patient.   The patient was advised to call immediately if  he has any concerning symptoms in the interval. The patient voices understanding of current disease status and treatment options and is in agreement with the current care plan. All questions were answered. The patient knows to call the clinic with any problems, questions or concerns. We can certainly see the patient much sooner if necessary             Orders Placed This Encounter  Procedures  . TSH    Standing Status:   Standing    Number of Occurrences:   10    Standing Expiration Date:   06/17/2021     I spent 20-29 minutes in this encounter.   Larry Alcock L Nicey Krah, PA-C 06/17/20

## 2020-06-15 DIAGNOSIS — J383 Other diseases of vocal cords: Secondary | ICD-10-CM | POA: Diagnosis not present

## 2020-06-15 DIAGNOSIS — C3432 Malignant neoplasm of lower lobe, left bronchus or lung: Secondary | ICD-10-CM | POA: Diagnosis not present

## 2020-06-15 DIAGNOSIS — Z930 Tracheostomy status: Secondary | ICD-10-CM | POA: Diagnosis not present

## 2020-06-15 DIAGNOSIS — J386 Stenosis of larynx: Secondary | ICD-10-CM | POA: Diagnosis not present

## 2020-06-17 ENCOUNTER — Other Ambulatory Visit: Payer: Self-pay

## 2020-06-17 ENCOUNTER — Inpatient Hospital Stay: Payer: Medicare HMO

## 2020-06-17 ENCOUNTER — Inpatient Hospital Stay: Payer: Medicare HMO | Attending: Internal Medicine | Admitting: Physician Assistant

## 2020-06-17 VITALS — BP 129/66 | HR 75 | Temp 97.5°F | Resp 17 | Wt 173.6 lb

## 2020-06-17 DIAGNOSIS — C3432 Malignant neoplasm of lower lobe, left bronchus or lung: Secondary | ICD-10-CM

## 2020-06-17 DIAGNOSIS — Z9221 Personal history of antineoplastic chemotherapy: Secondary | ICD-10-CM | POA: Diagnosis not present

## 2020-06-17 DIAGNOSIS — Z7982 Long term (current) use of aspirin: Secondary | ICD-10-CM | POA: Diagnosis not present

## 2020-06-17 DIAGNOSIS — I10 Essential (primary) hypertension: Secondary | ICD-10-CM | POA: Diagnosis not present

## 2020-06-17 DIAGNOSIS — Z5112 Encounter for antineoplastic immunotherapy: Secondary | ICD-10-CM | POA: Diagnosis not present

## 2020-06-17 DIAGNOSIS — Z87891 Personal history of nicotine dependence: Secondary | ICD-10-CM | POA: Diagnosis not present

## 2020-06-17 DIAGNOSIS — E119 Type 2 diabetes mellitus without complications: Secondary | ICD-10-CM | POA: Insufficient documentation

## 2020-06-17 DIAGNOSIS — Z7984 Long term (current) use of oral hypoglycemic drugs: Secondary | ICD-10-CM | POA: Diagnosis not present

## 2020-06-17 DIAGNOSIS — Z95828 Presence of other vascular implants and grafts: Secondary | ICD-10-CM

## 2020-06-17 DIAGNOSIS — Z7952 Long term (current) use of systemic steroids: Secondary | ICD-10-CM | POA: Insufficient documentation

## 2020-06-17 DIAGNOSIS — Z79899 Other long term (current) drug therapy: Secondary | ICD-10-CM | POA: Diagnosis not present

## 2020-06-17 DIAGNOSIS — C3490 Malignant neoplasm of unspecified part of unspecified bronchus or lung: Secondary | ICD-10-CM

## 2020-06-17 LAB — CMP (CANCER CENTER ONLY)
ALT: 13 U/L (ref 0–44)
AST: 18 U/L (ref 15–41)
Albumin: 3.3 g/dL — ABNORMAL LOW (ref 3.5–5.0)
Alkaline Phosphatase: 57 U/L (ref 38–126)
Anion gap: 8 (ref 5–15)
BUN: 16 mg/dL (ref 8–23)
CO2: 28 mmol/L (ref 22–32)
Calcium: 9.3 mg/dL (ref 8.9–10.3)
Chloride: 105 mmol/L (ref 98–111)
Creatinine: 1.13 mg/dL (ref 0.61–1.24)
GFR, Estimated: >60 mL/min (ref >60)
Glucose, Bld: 125 mg/dL — ABNORMAL HIGH (ref 70–99)
Potassium: 3.5 mmol/L (ref 3.5–5.1)
Sodium: 141 mmol/L (ref 135–145)
Total Bilirubin: 0.6 mg/dL (ref 0.3–1.2)
Total Protein: 7.6 g/dL (ref 6.5–8.1)

## 2020-06-17 LAB — CBC WITH DIFFERENTIAL (CANCER CENTER ONLY)
Abs Immature Granulocytes: 0.01 10*3/uL (ref 0.00–0.07)
Basophils Absolute: 0 10*3/uL (ref 0.0–0.1)
Basophils Relative: 0 %
Eosinophils Absolute: 0.2 10*3/uL (ref 0.0–0.5)
Eosinophils Relative: 4 %
HCT: 28.2 % — ABNORMAL LOW (ref 39.0–52.0)
Hemoglobin: 9.3 g/dL — ABNORMAL LOW (ref 13.0–17.0)
Immature Granulocytes: 0 %
Lymphocytes Relative: 28 %
Lymphs Abs: 1.5 10*3/uL (ref 0.7–4.0)
MCH: 31.3 pg (ref 26.0–34.0)
MCHC: 33 g/dL (ref 30.0–36.0)
MCV: 94.9 fL (ref 80.0–100.0)
Monocytes Absolute: 0.6 10*3/uL (ref 0.1–1.0)
Monocytes Relative: 11 %
Neutro Abs: 3 10*3/uL (ref 1.7–7.7)
Neutrophils Relative %: 57 %
Platelet Count: 270 10*3/uL (ref 150–400)
RBC: 2.97 MIL/uL — ABNORMAL LOW (ref 4.22–5.81)
RDW: 13.4 % (ref 11.5–15.5)
WBC Count: 5.3 10*3/uL (ref 4.0–10.5)
nRBC: 0 % (ref 0.0–0.2)

## 2020-06-17 MED ORDER — SODIUM CHLORIDE 0.9 % IV SOLN
Freq: Once | INTRAVENOUS | Status: AC
  Filled 2020-06-17: qty 250

## 2020-06-17 MED ORDER — SODIUM CHLORIDE 0.9% FLUSH
10.0000 mL | INTRAVENOUS | Status: DC | PRN
  Administered 2020-06-17: 10 mL
  Filled 2020-06-17: qty 10

## 2020-06-17 MED ORDER — SODIUM CHLORIDE 0.9% FLUSH
10.0000 mL | Freq: Once | INTRAVENOUS | Status: AC
  Administered 2020-06-17: 10 mL
  Filled 2020-06-17: qty 10

## 2020-06-17 MED ORDER — HEPARIN SOD (PORK) LOCK FLUSH 100 UNIT/ML IV SOLN
500.0000 [IU] | Freq: Once | INTRAVENOUS | Status: AC | PRN
  Administered 2020-06-17: 500 [IU]
  Filled 2020-06-17: qty 5

## 2020-06-17 MED ORDER — SODIUM CHLORIDE 0.9 % IV SOLN
360.0000 mg | Freq: Once | INTRAVENOUS | Status: AC
  Administered 2020-06-17: 360 mg via INTRAVENOUS
  Filled 2020-06-17: qty 24

## 2020-06-17 NOTE — Patient Instructions (Signed)
Nivolumab injection What is this medicine? NIVOLUMAB (nye VOL ue mab) is a monoclonal antibody. It treats certain types of cancer. Some of the cancers treated are colon cancer, head and neck cancer, Hodgkin lymphoma, lung cancer, and melanoma. This medicine may be used for other purposes; ask your health care provider or pharmacist if you have questions. COMMON BRAND NAME(S): Opdivo What should I tell my health care provider before I take this medicine? They need to know if you have any of these conditions:  autoimmune diseases like Crohn's disease, ulcerative colitis, or lupus  have had or planning to have an allogeneic stem cell transplant (uses someone else's stem cells)  history of chest radiation  history of organ transplant  nervous system problems like myasthenia gravis or Guillain-Barre syndrome  an unusual or allergic reaction to nivolumab, other medicines, foods, dyes, or preservatives  pregnant or trying to get pregnant  breast-feeding How should I use this medicine? This medicine is for infusion into a vein. It is given by a health care professional in a hospital or clinic setting. A special MedGuide will be given to you before each treatment. Be sure to read this information carefully each time. Talk to your pediatrician regarding the use of this medicine in children. While this drug may be prescribed for children as young as 12 years for selected conditions, precautions do apply. Overdosage: If you think you have taken too much of this medicine contact a poison control center or emergency room at once. NOTE: This medicine is only for you. Do not share this medicine with others. What if I miss a dose? It is important not to miss your dose. Call your doctor or health care professional if you are unable to keep an appointment. What may interact with this medicine? Interactions have not been studied. This list may not describe all possible interactions. Give your health  care provider a list of all the medicines, herbs, non-prescription drugs, or dietary supplements you use. Also tell them if you smoke, drink alcohol, or use illegal drugs. Some items may interact with your medicine. What should I watch for while using this medicine? This drug may make you feel generally unwell. Continue your course of treatment even though you feel ill unless your doctor tells you to stop. You may need blood work done while you are taking this medicine. Do not become pregnant while taking this medicine or for 5 months after stopping it. Women should inform their doctor if they wish to become pregnant or think they might be pregnant. There is a potential for serious side effects to an unborn child. Talk to your health care professional or pharmacist for more information. Do not breast-feed an infant while taking this medicine or for 5 months after stopping it. What side effects may I notice from receiving this medicine? Side effects that you should report to your doctor or health care professional as soon as possible:  allergic reactions like skin rash, itching or hives, swelling of the face, lips, or tongue  breathing problems  blood in the urine  bloody or watery diarrhea or black, tarry stools  changes in emotions or moods  changes in vision  chest pain  cough  dizziness  feeling faint or lightheaded, falls  fever, chills  headache with fever, neck stiffness, confusion, loss of memory, sensitivity to light, hallucination, loss of contact with reality, or seizures  joint pain  mouth sores  redness, blistering, peeling or loosening of the skin, including inside the  mouth  severe muscle pain or weakness  signs and symptoms of high blood sugar such as dizziness; dry mouth; dry skin; fruity breath; nausea; stomach pain; increased hunger or thirst; increased urination  signs and symptoms of kidney injury like trouble passing urine or change in the amount of  urine  signs and symptoms of liver injury like dark yellow or brown urine; general ill feeling or flu-like symptoms; light-colored stools; loss of appetite; nausea; right upper belly pain; unusually weak or tired; yellowing of the eyes or skin  swelling of the ankles, feet, hands  trouble passing urine or change in the amount of urine  unusually weak or tired  weight gain or loss Side effects that usually do not require medical attention (report to your doctor or health care professional if they continue or are bothersome):  bone pain  constipation  decreased appetite  diarrhea  muscle pain  nausea, vomiting  tiredness This list may not describe all possible side effects. Call your doctor for medical advice about side effects. You may report side effects to FDA at 1-800-FDA-1088. Where should I keep my medicine? This drug is given in a hospital or clinic and will not be stored at home. NOTE: This sheet is a summary. It may not cover all possible information. If you have questions about this medicine, talk to your doctor, pharmacist, or health care provider.  2021 [employer]/Gold Standard (2019-05-29 10:08:25)  Ipilimumab injection What is this medicine? IPILIMUMAB (IP i LIM ue mab) is a monoclonal antibody. It is used to treat colorectal cancer, kidney cancer, liver cancer, lung cancer, melanoma, and mesothelioma. This medicine may be used for other purposes; ask your health care provider or pharmacist if you have questions. COMMON BRAND NAME(S): YERVOY What should I tell my health care provider before I take this medicine? They need to know if you have any of these conditions:  autoimmune diseases like Crohn's disease, ulcerative colitis, or lupus  have had or planning to have an allogeneic stem cell transplant (uses someone else's stem cells)  history of organ transplant  nervous system problems like myasthenia gravis or Guillain-Barre syndrome  an unusual or allergic  reaction to ipilimumab, other medicines, foods, dyes, or preservatives  pregnant or trying to get pregnant  breast-feeding How should I use this medicine? This medicine is for infusion into a vein. It is given by a health care professional in a hospital or clinic setting. A special MedGuide will be given to you before each treatment. Be sure to read this information carefully each time. Talk to your pediatrician regarding the use of this medicine in children. While this drug may be prescribed for children as young as 12 years for selected conditions, precautions do apply. Overdosage: If you think you have taken too much of this medicine contact a poison control center or emergency room at once. NOTE: This medicine is only for you. Do not share this medicine with others. What if I miss a dose? It is important not to miss your dose. Call your doctor or health care professional if you are unable to keep an appointment. What may interact with this medicine? Interactions are not expected. This list may not describe all possible interactions. Give your health care provider a list of all the medicines, herbs, non-prescription drugs, or dietary supplements you use. Also tell them if you smoke, drink alcohol, or use illegal drugs. Some items may interact with your medicine. What should I watch for while using this medicine? Tell  your doctor or healthcare professional if your symptoms do not start to get better or if they get worse. Do not become pregnant while taking this medicine or for 3 months after stopping it. Women should inform their doctor if they wish to become pregnant or think they might be pregnant. There is a potential for serious side effects to an unborn child. Talk to your health care professional or pharmacist for more information. Do not breast-feed an infant while taking this medicine or for 3 months after the last dose. Your condition will be monitored carefully while you are receiving  this medicine. You may need blood work done while you are taking this medicine. What side effects may I notice from receiving this medicine? Side effects that you should report to your doctor or health care professional as soon as possible:  allergic reactions like skin rash, itching or hives, swelling of the face, lips, or tongue  black, tarry stools  bloody or watery diarrhea  changes in vision  dizziness  eye pain  fast, irregular heartbeat  feeling anxious  feeling faint or lightheaded, falls  nausea, vomiting  pain, tingling, numbness in the hands or feet  redness, blistering, peeling or loosening of the skin, including inside the mouth  signs and symptoms of liver injury like dark yellow or brown urine; general ill feeling or flu-like symptoms; light-colored stools; loss of appetite; nausea; right upper belly pain; unusually weak or tired; yellowing of the eyes or skin  unusual bleeding or bruising Side effects that usually do not require medical attention (report to your doctor or health care professional if they continue or are bothersome):  headache  loss of appetite  trouble sleeping This list may not describe all possible side effects. Call your doctor for medical advice about side effects. You may report side effects to FDA at 1-800-FDA-1088. Where should I keep my medicine? This drug is given in a hospital or clinic and will not be stored at home. NOTE: This sheet is a summary. It may not cover all possible information. If you have questions about this medicine, talk to your doctor, pharmacist, or health care provider.  2021 [employer]vier/Gold Standard (2018-12-26 18:53:00)

## 2020-06-19 DIAGNOSIS — Z930 Tracheostomy status: Secondary | ICD-10-CM | POA: Diagnosis not present

## 2020-06-19 DIAGNOSIS — C3432 Malignant neoplasm of lower lobe, left bronchus or lung: Secondary | ICD-10-CM | POA: Diagnosis not present

## 2020-06-19 DIAGNOSIS — J386 Stenosis of larynx: Secondary | ICD-10-CM | POA: Diagnosis not present

## 2020-06-23 DIAGNOSIS — Z930 Tracheostomy status: Secondary | ICD-10-CM | POA: Diagnosis not present

## 2020-06-23 DIAGNOSIS — J386 Stenosis of larynx: Secondary | ICD-10-CM | POA: Diagnosis not present

## 2020-06-23 DIAGNOSIS — C3432 Malignant neoplasm of lower lobe, left bronchus or lung: Secondary | ICD-10-CM | POA: Diagnosis not present

## 2020-06-29 ENCOUNTER — Telehealth: Payer: Self-pay

## 2020-06-29 DIAGNOSIS — C3432 Malignant neoplasm of lower lobe, left bronchus or lung: Secondary | ICD-10-CM

## 2020-06-29 MED ORDER — DRONABINOL 2.5 MG PO CAPS: 2.5000 mg | ORAL_CAPSULE | Freq: Two times a day (BID) | ORAL | 1 refills | Status: DC

## 2020-06-29 NOTE — Telephone Encounter (Signed)
Received a fax from [facility] for a refill on Dronabinol pt. Last apt was 04/29/20 andnext apt is 07/30/20.

## 2020-06-29 NOTE — Addendum Note (Signed)
Addended by: [name] on: 06/29/2020 05:18 PM   Modules accepted: Orders

## 2020-07-01 ENCOUNTER — Ambulatory Visit: Payer: Medicare HMO | Admitting: Internal Medicine

## 2020-07-01 ENCOUNTER — Ambulatory Visit: Payer: Medicare HMO

## 2020-07-01 ENCOUNTER — Other Ambulatory Visit: Payer: Medicare HMO

## 2020-07-08 ENCOUNTER — Other Ambulatory Visit: Payer: Self-pay

## 2020-07-08 ENCOUNTER — Inpatient Hospital Stay: Payer: Medicare HMO

## 2020-07-08 ENCOUNTER — Inpatient Hospital Stay: Payer: Medicare HMO | Attending: Internal Medicine | Admitting: Internal Medicine

## 2020-07-08 ENCOUNTER — Encounter: Payer: Self-pay | Admitting: Internal Medicine

## 2020-07-08 VITALS — BP 122/61 | HR 72 | Temp 97.3°F | Resp 16 | Ht 67.0 in | Wt 174.1 lb

## 2020-07-08 DIAGNOSIS — Z7984 Long term (current) use of oral hypoglycemic drugs: Secondary | ICD-10-CM | POA: Insufficient documentation

## 2020-07-08 DIAGNOSIS — E119 Type 2 diabetes mellitus without complications: Secondary | ICD-10-CM | POA: Diagnosis not present

## 2020-07-08 DIAGNOSIS — Z7982 Long term (current) use of aspirin: Secondary | ICD-10-CM | POA: Insufficient documentation

## 2020-07-08 DIAGNOSIS — Z87891 Personal history of nicotine dependence: Secondary | ICD-10-CM | POA: Insufficient documentation

## 2020-07-08 DIAGNOSIS — C3432 Malignant neoplasm of lower lobe, left bronchus or lung: Secondary | ICD-10-CM | POA: Diagnosis not present

## 2020-07-08 DIAGNOSIS — E785 Hyperlipidemia, unspecified: Secondary | ICD-10-CM | POA: Diagnosis not present

## 2020-07-08 DIAGNOSIS — Z5112 Encounter for antineoplastic immunotherapy: Secondary | ICD-10-CM | POA: Insufficient documentation

## 2020-07-08 DIAGNOSIS — Z79899 Other long term (current) drug therapy: Secondary | ICD-10-CM | POA: Insufficient documentation

## 2020-07-08 DIAGNOSIS — Z95828 Presence of other vascular implants and grafts: Secondary | ICD-10-CM

## 2020-07-08 DIAGNOSIS — Z7952 Long term (current) use of systemic steroids: Secondary | ICD-10-CM | POA: Insufficient documentation

## 2020-07-08 DIAGNOSIS — J45909 Unspecified asthma, uncomplicated: Secondary | ICD-10-CM | POA: Insufficient documentation

## 2020-07-08 DIAGNOSIS — C3490 Malignant neoplasm of unspecified part of unspecified bronchus or lung: Secondary | ICD-10-CM

## 2020-07-08 DIAGNOSIS — I10 Essential (primary) hypertension: Secondary | ICD-10-CM | POA: Insufficient documentation

## 2020-07-08 LAB — CBC WITH DIFFERENTIAL (CANCER CENTER ONLY)
Abs Immature Granulocytes: 0.01 10*3/uL (ref 0.00–0.07)
Basophils Absolute: 0 10*3/uL (ref 0.0–0.1)
Basophils Relative: 0 %
Eosinophils Absolute: 0.3 10*3/uL (ref 0.0–0.5)
Eosinophils Relative: 5 %
HCT: 29 % — ABNORMAL LOW (ref 39.0–52.0)
Hemoglobin: 9.6 g/dL — ABNORMAL LOW (ref 13.0–17.0)
Immature Granulocytes: 0 %
Lymphocytes Relative: 26 %
Lymphs Abs: 1.4 10*3/uL (ref 0.7–4.0)
MCH: 31.1 pg (ref 26.0–34.0)
MCHC: 33.1 g/dL (ref 30.0–36.0)
MCV: 93.9 fL (ref 80.0–100.0)
Monocytes Absolute: 0.6 10*3/uL (ref 0.1–1.0)
Monocytes Relative: 11 %
Neutro Abs: 3.1 10*3/uL (ref 1.7–7.7)
Neutrophils Relative %: 58 %
Platelet Count: 253 10*3/uL (ref 150–400)
RBC: 3.09 MIL/uL — ABNORMAL LOW (ref 4.22–5.81)
RDW: 12.6 % (ref 11.5–15.5)
WBC Count: 5.4 10*3/uL (ref 4.0–10.5)
nRBC: 0 % (ref 0.0–0.2)

## 2020-07-08 LAB — CMP (CANCER CENTER ONLY)
ALT: 13 U/L (ref 0–44)
AST: 17 U/L (ref 15–41)
Albumin: 3.4 g/dL — ABNORMAL LOW (ref 3.5–5.0)
Alkaline Phosphatase: 62 U/L (ref 38–126)
Anion gap: 10 (ref 5–15)
BUN: 17 mg/dL (ref 8–23)
CO2: 26 mmol/L (ref 22–32)
Calcium: 9.5 mg/dL (ref 8.9–10.3)
Chloride: 104 mmol/L (ref 98–111)
Creatinine: 1.07 mg/dL (ref 0.61–1.24)
GFR, Estimated: >60 mL/min (ref >60)
Glucose, Bld: 92 mg/dL (ref 70–99)
Potassium: 3.9 mmol/L (ref 3.5–5.1)
Sodium: 140 mmol/L (ref 135–145)
Total Bilirubin: 0.4 mg/dL (ref 0.3–1.2)
Total Protein: 8 g/dL (ref 6.5–8.1)

## 2020-07-08 LAB — TSH: TSH: 1.11 u[IU]/mL (ref 0.320–4.118)

## 2020-07-08 MED ORDER — SODIUM CHLORIDE 0.9% FLUSH
10.0000 mL | INTRAVENOUS | Status: DC | PRN
  Filled 2020-07-08: qty 10

## 2020-07-08 MED ORDER — DIPHENHYDRAMINE HCL 50 MG/ML IJ SOLN
INTRAMUSCULAR | Status: AC
  Filled 2020-07-08: qty 1

## 2020-07-08 MED ORDER — FAMOTIDINE 20 MG IN NS 100 ML IVPB
20.0000 mg | Freq: Once | INTRAVENOUS | Status: AC
  Administered 2020-07-08: 20 mg via INTRAVENOUS

## 2020-07-08 MED ORDER — HEPARIN SOD (PORK) LOCK FLUSH 100 UNIT/ML IV SOLN
500.0000 [IU] | Freq: Once | INTRAVENOUS | Status: DC | PRN
  Filled 2020-07-08: qty 5

## 2020-07-08 MED ORDER — SODIUM CHLORIDE 0.9 % IV SOLN
1.0000 mg/kg | Freq: Once | INTRAVENOUS | Status: AC
  Administered 2020-07-08: 80 mg via INTRAVENOUS
  Filled 2020-07-08: qty 16

## 2020-07-08 MED ORDER — SODIUM CHLORIDE 0.9 % IV SOLN
Freq: Once | INTRAVENOUS | Status: AC
  Filled 2020-07-08: qty 250

## 2020-07-08 MED ORDER — FAMOTIDINE 20 MG IN NS 100 ML IVPB
INTRAVENOUS | Status: AC
  Filled 2020-07-08: qty 100

## 2020-07-08 MED ORDER — DIPHENHYDRAMINE HCL 50 MG/ML IJ SOLN
25.0000 mg | Freq: Once | INTRAMUSCULAR | Status: AC
  Administered 2020-07-08: 25 mg via INTRAVENOUS

## 2020-07-08 MED ORDER — SODIUM CHLORIDE 0.9 % IV SOLN
360.0000 mg | Freq: Once | INTRAVENOUS | Status: AC
  Administered 2020-07-08: 360 mg via INTRAVENOUS
  Filled 2020-07-08: qty 24

## 2020-07-08 MED ORDER — SODIUM CHLORIDE 0.9% FLUSH
10.0000 mL | Freq: Once | INTRAVENOUS | Status: AC
  Administered 2020-07-08: 10 mL
  Filled 2020-07-08: qty 10

## 2020-07-08 NOTE — Patient Outreach (Signed)
[facility]) Care Management  07/08/2020  Shaun Kennedy [birth year] [id number]   Telephone Assessment   Successful call placed to the home. RN CM spoke both with spouse and patient. They voice that overall things are going well. Their main concern is possible phlegm/congestion that patient has been unable to "cough up." Patient reports that him and family performing trach care several times per day (6-7x). He admits that he does not like to be suctioned as sometimes his family tries to go "too deep" and it makes him gag. RN CM reviewed trach care and s/s of possible infection and when to seek medical attention. He goes for oncology appt this afternoon dn advised patient to discuss this with MD so someone an assess his lungs. He voiced understanding. He denies any RN CM needs or concerns at this time.   Medications Reviewed Today    Reviewed by [name], RN (Registered Nurse) on 07/08/20 at 1012  Med List Status: <None>  Medication Order Taking? Sig Documenting Provider Last Dose Status Informant  acetaminophen (TYLENOL) 500 MG tablet [id number] No Take 1,000 mg by mouth every 4 (four) hours as needed. [provider] Taking Active Self  amLODipine (NORVASC) 5 MG tablet [id number] No Take 1 tablet (5 mg total) by mouth daily. [name], MD Taking Active Spouse/Significant Other  ANORO ELLIPTA 62.5-25 MCG/INH AEPB [id number] No Inhale 1 puff into the lungs daily. [provider] Taking Active   aspirin EC 81 MG tablet [id number] No Take 81 mg by mouth daily after breakfast.  [provider] Taking Active Spouse/Significant Other  Blood Glucose Monitoring Suppl [facility] VERIO) w/Device KIT [id number] No USE TO CHECK SUGAR DAILY [name], MD Taking Active Spouse/Significant Other  ciprofloxacin (CIPRO) 500 MG tablet [id number] No Take 500 mg by mouth 2 (two) times daily. [provider] Taking Active   doxycycline  (VIBRAMYCIN) 100 MG capsule [id number] No Take 1 capsule (100 mg total) by mouth 2 (two) times daily. Isla Pence, MD Taking Active   dronabinol (MARINOL) 2.5 MG capsule [id number]  Take 1 capsule (2.5 mg total) by mouth 2 (two) times daily before a meal. [name], MD  Active   ferrous sulfate 325 (65 FE) MG EC tablet [id number] No Take 1 tablet (325 mg total) by mouth daily with breakfast. [name], MD Taking Active Spouse/Significant Other  HYDROcodone-acetaminophen (NORCO/VICODIN) 5-325 MG tablet [id number] No Take 1 tablet by mouth every 4 (four) hours as needed. Isla Pence, MD Taking Active   Lancets (ONETOUCH DELICA PLUS [id number]) [place] [id number] No PATIENT IS TO TEST TWO TIMES A DAY DX: E11.9 (ONETOUCH VERIO FLEX) [name], MD Taking Active Spouse/Significant Other  metFORMIN (GLUCOPHAGE-XR) 500 MG 24 hr tablet [id number] No Take 1 tablet (500 mg total) by mouth daily. [name], MD Taking Active Spouse/Significant Other  methylPREDNISolone (MEDROL DOSEPAK) 4 MG TBPK tablet [id number] No Use as instructed Curt Bears, MD Taking Active   Multiple Vitamin (MULTIVITAMIN WITH MINERALS) TABS tablet [id number] No Take 1 tablet by mouth daily after breakfast.  [provider] Taking Active Spouse/Significant Other  polyethylene glycol (MIRALAX / GLYCOLAX) 17 g packet [id number] No Take 17 g by mouth daily as needed for moderate constipation.  Patient not taking: Reported on 05/29/2020   Nita Sells, MD Not Taking Active   senna-docusate (SENOKOT-S) 8.6-50 MG tablet [id number] No Take 2 tablets by mouth 2 (two) times daily.  Patient not taking: Reported on 05/29/2020  Nita Sells, MD Not Taking Active   solifenacin (VESICARE) 5 MG tablet [id number] No Take 1 tablet (5 mg total) by mouth daily. [name], MD Taking Active           Goals Addressed              This Visit's Progress   .  (THN)Anorexia, Nausea and Vomiting Managed  (pt-stated)          Timeframe:  Short-Term Goal Priority:  High Start Date:   06/08/20                          Expected End Date:   June 2022 Follow Up Date: June 2022                     Barriers: Health Behaviors Knowledge   -patient/family will be able to verbalize at least 2-3 ways to manage chemo txs SEs -pt will maintain wgt  Evidence-based guidance:   Prepare patient for treatment aimed at prevention of nausea and vomiting throughout the period of risk, based on chemotherapy agent and treatment protocol during every treatment cycle.   Assess characteristics that may differentiate between chemotherapy-induced nausea and vomiting (acute or delayed), radiation treatment-induced nausea and vomiting, anticipatory or breakthrough nausea and vomiting.   Prepare patient for pharmacologic therapy, such as antiemetic, anxiolytic, H2 blocker, proton pump inhibitor, steroid, benzodiazepine, neurokinin-1 receptor antagonist, cannabinoid, atypical antipsychotic, phenothiazine or serotonin    receptor antagonist.   Monitor efficacy and manage side effects.   Consider around-the-clock versus PRN dosing.   Promote high-protein and high-calorie foods, small frequent meals, bland foods, beverages from a covered cup with a straw, the use of a fan directed toward face for nausea prevention.   Encourage the avoidance of greasy, fried foods and those with strong odors.   Promote the use of nonpharmacologic interventions, such as relaxation, guided imagery, acupuncture, acupressure, music therapy, as well as psychologic and educational support or information.   Provide or refer for individualized medical nutrition assessment and therapy.   Assess and trend hydration status, fluid balance and weight; monitor for signs/symptoms of dehydration.   Advocate for home intravenous fluids when necessary.   Encourage the keeping of a symptom journal that includes what antiemetic therapy is effective  for specific chemotherapy agent.   Consider nutrition support options at the first sign of weight or appetite loss that include food fortification, oral nutrition support, supplemental tube feeding and total parenteral nutrition.   Notes:   06/08/2020-Patient to resume chemo txs within the next few weeks. Caregivers report he did fairly well with first few rounds and minimal SEs.   07/08/2020-Patient has started chemo txs-about one week so far. He reports so far he ahs been abel to tolerate them. Appeiite has been WNL. He denies any GI sxs at present. He goes back today for another tx.     .  COMPLETED: (THN)Make and Keep All Appointments (pt-stated)        Timeframe:  Short-Term Goal Priority:  High Start Date:06/08/20                         Expected End Date:    June 2022                   Follow Up Date June 2022    Barriers: None  - call to cancel if needed  -  adhere to cancer tx schedule as ordered by MD   Why is this important?    Part of staying healthy is seeing the doctor for follow-up care.   If you forget your appointments, there are some things you can do to stay on track.    Notes:  06/28/20-Patient to resume chemo txs. Family will be taking pt to appts.   07/08/20-Patient  has completed all post discharge appt and going to chemo appts as ordered.    .  (THN)Optimal Respiratory Status (pt-stated)          Timeframe:  Short-Term Goal Priority:  High Start Date:      07/08/2020                       Expected End Date: 09/05/2020 Follow Up Date: July 2022   Barriers: Health Behaviors Knowledge                        -pt/family will perform trach care at home -pt/family will monitor for s/s of infection or impaired resp status   Evidence-based guidance:    Anticipate/prepare caregiver for hospitalization with persistent symptoms including increased respiratory effort, tachypnea, hypoxemia, apnea, feeding intolerance; consider presence of severe disease risk  factors.   Encourage caregiver to clear nasal congestion (especially before feeding) based on work of breathing.   Use techniques, such as superficial nasal suction, elevating head of bed when able, making frequent position changes, decreasing stimulation, clustering care to conserve energy and increase oxygenation, promoting rest.    If high-risk, anticipate/prepare child and caregiver for administration of palivizumab administered intramuscularly once a month through respiratory syncytial virus (RSV) season to a maximum of 5 doses.   Encourage caregiver tobacco cessation; support avoidance of second-hand smoke exposure.   Promote infection prevention techniques: caregiver handwashing, avoiding contact with those with respiratory illnesses and/or second-hand smoke exposure and continuing breastfeeding until at least 59 months of age.   Provide anticipatory guidance to parent/caregiver regarding awareness of worsening signs/symptoms and when to call provider; acknowledge fear and provide support and reassurance.    Notes:   07/08/2020-Patient with complaints of phlegm/mucus despite performing freq trach care throughout the day. He is going to be evaluated by MD today. Denies any s/s of resp infection.     .  (THN)Set My Target A1C-Diabetes Type 2 (pt-stated)        Timeframe:  Long-Range Goal Priority:  Medium Start Date: 04/27/2020                            Expected End Date:  Sept 2022                     Follow Up Date June 2022    Barriers: Health Behaviors Knowledge   - set target A1C  -monitor cbgs as ordered -complete MD appts as ordered   Why is this important?    Your target A1C is decided together by you and your doctor.   It is based on several things like your age and other health issues.    Notes:  04/27/2020-Patient went for PCP appt today and had A1C level drawn-results pending. Last A1C on file 5.9(Jan 2022).  05/12/2020-Spouse reports appetite gradually  increasing-blood sugars WNL. A1C lab result was 6.2(04/27/20)  06/08/20-Caregivers report pt continues to maintain good appetite. Blood sugars have ben in the low to mid  100s.  07/08/2020-Patient reports appetite remains good and WNL for him. Wgt stable. Family monitoring cbgs but unsure of reading this morning-unable to recall. No current labwork on file.         Plan: RN CM discussed with patient next outreach within the month of July. Patient gave verbal consent and in agreement with RN CM follow up and timeframe. Patient aware that they may contact RN CM sooner for any issues or concerns. RN CM reviewed goals and plan of care with patient. Patient agrees to care plan and follow up. RN CM will send quarterly update to PCP.  [name], RN,BSN,CCM [facility] Telephonic Care Management Coordinator Direct Phone: [phone] Toll Free: [phone] Fax: [phone]

## 2020-07-08 NOTE — Progress Notes (Signed)

## 2020-07-08 NOTE — Patient Instructions (Signed)
[facility]  Discharge Instructions: Thank you for choosing [facility] to provide your oncology and hematology care.   If you have a lab appointment with the [facility], please go directly to the [facility] and check in at the registration area.   Wear comfortable clothing and clothing appropriate for easy access to any Portacath or PICC line.   We strive to give you quality time with your provider. You may need to reschedule your appointment if you arrive late (15 or more minutes).  Arriving late affects you and other patients whose appointments are after yours.  Also, if you miss three or more appointments without notifying the office, you may be dismissed from the clinic at the provider's discretion.      For prescription refill requests, have your pharmacy contact our office and allow 72 hours for refills to be completed.    Today you received the following chemotherapy and/or immunotherapy agents opdivo/ yervoy   To help prevent nausea and vomiting after your treatment, we encourage you to take your nausea medication as directed.  BELOW ARE SYMPTOMS THAT SHOULD BE REPORTED IMMEDIATELY: . *FEVER GREATER THAN 100.4 F (38 C) OR HIGHER . *CHILLS OR SWEATING . *NAUSEA AND VOMITING THAT IS NOT CONTROLLED WITH YOUR NAUSEA MEDICATION . *UNUSUAL SHORTNESS OF BREATH . *UNUSUAL BRUISING OR BLEEDING . *URINARY PROBLEMS (pain or burning when urinating, or frequent urination) . *BOWEL PROBLEMS (unusual diarrhea, constipation, pain near the anus) . TENDERNESS IN MOUTH AND THROAT WITH OR WITHOUT PRESENCE OF ULCERS (sore throat, sores in mouth, or a toothache) . UNUSUAL RASH, SWELLING OR PAIN  . UNUSUAL VAGINAL DISCHARGE OR ITCHING   Items with * indicate a potential emergency and should be followed up as soon as possible or go to the Emergency Department if any problems should occur.  Please show the CHEMOTHERAPY ALERT CARD or IMMUNOTHERAPY ALERT  CARD at check-in to the Emergency Department and triage nurse.  Should you have questions after your visit or need to cancel or reschedule your appointment, please contact [facility]  Dept: [phone]  and follow the prompts.  Office hours are 8:00 a.m. to 4:30 p.m. Monday - Friday. Please note that voicemails left after 4:00 p.m. may not be returned until the following business day.  We are closed weekends and major holidays. You have access to a nurse at all times for urgent questions. Please call the main number to the clinic Dept: [phone] and follow the prompts.   For any non-urgent questions, you may also contact your provider using MyChart. We now offer e-Visits for anyone 66 and older to request care online for non-urgent symptoms. For details visit [email or web address].   Also download the MyChart app! Go to the app store, search "MyChart", open the app, select [facility], and log in with your MyChart username and password.  Due to Covid, a mask is required upon entering the hospital/clinic. If you do not have a mask, one will be given to you upon arrival. For doctor visits, patients may have 1 support person aged 82 or older with them. For treatment visits, patients cannot have anyone with them due to current Covid guidelines and our immunocompromised population.

## 2020-07-08 NOTE — Progress Notes (Signed)
Welcome Telephone:([phone]   Fax:([phone]  OFFICE PROGRESS NOTE  Shaun Kennedy, [place] [address]  DIAGNOSIS: stage IV (T3, N2, M1 a) non-small cell Kennedy cancer, squamous cell carcinoma presented with obstructive left lower lobe Kennedy mass in addition to mediastinal lymphadenopathy as well as bilateral pulmonary nodules diagnosed in July 2021.  PDL1: 0%  PRIOR THERAPY: None  CURRENT THERAPY: 1) Palliative radiotherapy to the obstructive left lower lobe Kennedy mass under the care of Dr. Lisbeth Renshaw.  2)  systemic chemotherapy 2 cycles of chemotherapy with carboplatin for an AUC of 5 and paclitaxel 175 mg/m2 in addition to immunotherapy with nivolumab 360 mg every 3 weeks and ipilimumab 1 mg/kg IV every 6 weeks followed by maintenance nivolumab and ipilimumab.  He started the first treatment on 09/04/2019.  He is status post 7 cycles of the treatment.  INTERVAL HISTORY: Shaun Kennedy 80 y.o. male returns to the clinic today for follow-up visit.  The patient is feeling fine today with no concerning complaints except for the cough and mucus production from the tracheostomy site.  He denied having any current chest pain, shortness of breath or hemoptysis.  He denied having any weight loss or night sweats.  He has no nausea, vomiting, diarrhea or constipation.  He has no recent weight loss or night sweats.  He continues to tolerate his treatment with immunotherapy fairly well.  The patient is here today for evaluation before starting day 1 of cycle #8.  MEDICAL HISTORY: Past Medical History:  Diagnosis Date  . Allergic rhinitis   . Asthma   . Carotid stenosis   . Colon cancer ([place]) 2003  . Diabetes mellitus ([place])   . Diverticulosis   . Dyslipidemia   . ED (erectile dysfunction)   . GERD (gastroesophageal reflux disease)   . H/O degenerative disc disease   . Hemorrhoids   . HTN (hypertension)   . Hyperlipidemia   . Kennedy  cancer ([place])   . LVH (left ventricular hypertrophy)    on EKG  . Mass of lower lobe of left Kennedy   . Mediastinal adenopathy   . Smoker    former  . Wears dentures   . Wears dentures   . Wears glasses   . Wears glasses     ALLERGIES:  has No Known Allergies.  MEDICATIONS:  Current Outpatient Medications  Medication Sig Dispense Refill  . acetaminophen (TYLENOL) 500 MG tablet Take 1,000 mg by mouth every 4 (four) hours as needed.    Marland Kitchen amLODipine (NORVASC) 5 MG tablet Take 1 tablet (5 mg total) by mouth daily. 90 tablet 3  . ANORO ELLIPTA 62.5-25 MCG/INH AEPB Inhale 1 puff into the lungs daily.    Marland Kitchen aspirin EC 81 MG tablet Take 81 mg by mouth daily after breakfast.     . Blood Glucose Monitoring Suppl (ONETOUCH VERIO) w/Device KIT USE TO CHECK SUGAR DAILY 1 kit 0  . ciprofloxacin (CIPRO) 500 MG tablet Take 500 mg by mouth 2 (two) times daily.    Marland Kitchen doxycycline (VIBRAMYCIN) 100 MG capsule Take 1 capsule (100 mg total) by mouth 2 (two) times daily. 14 capsule 0  . dronabinol (MARINOL) 2.5 MG capsule Take 1 capsule (2.5 mg total) by mouth 2 (two) times daily before a meal. 60 capsule 1  . ferrous sulfate 325 (65 FE) MG EC tablet Take 1 tablet (325 mg total) by mouth daily with breakfast. 30 tablet 12  .  HYDROcodone-acetaminophen (NORCO/VICODIN) 5-325 MG tablet Take 1 tablet by mouth every 4 (four) hours as needed. 10 tablet 0  . Lancets (ONETOUCH DELICA PLUS [id number]) MISC PATIENT IS TO TEST TWO TIMES A DAY DX: E11.9 (ONETOUCH VERIO FLEX) 100 each 12  . metFORMIN (GLUCOPHAGE-XR) 500 MG 24 hr tablet Take 1 tablet (500 mg total) by mouth daily. 90 tablet 1  . methylPREDNISolone (MEDROL DOSEPAK) 4 MG TBPK tablet Use as instructed 21 tablet 0  . Multiple Vitamin (MULTIVITAMIN WITH MINERALS) TABS tablet Take 1 tablet by mouth daily after breakfast.     . polyethylene glycol (MIRALAX / GLYCOLAX) 17 g packet Take 17 g by mouth daily as needed for moderate constipation. (Patient not taking:  Reported on 05/29/2020) 14 each 0  . senna-docusate (SENOKOT-S) 8.6-50 MG tablet Take 2 tablets by mouth 2 (two) times daily. (Patient not taking: Reported on 05/29/2020) 30 tablet 0  . solifenacin (VESICARE) 5 MG tablet Take 1 tablet (5 mg total) by mouth daily. 90 tablet 1   No current facility-administered medications for this visit.   Facility-Administered Medications Ordered in Other Visits  Medication Dose Route Frequency Provider Last Rate Last Admin  . sodium chloride flush (NS) 0.9 % injection 10 mL  10 mL Intracatheter PRN Curt Bears, MD   10 mL at 09/04/19 1556    SURGICAL HISTORY:  Past Surgical History:  Procedure Laterality Date  . BRONCHIAL BIOPSY  08/20/2019   Procedure: BRONCHIAL BIOPSIES;  Surgeon: Collene Gobble, MD;  Location: [facility] ENDOSCOPY;  Service: Pulmonary;;  . BRONCHIAL BRUSHINGS  08/20/2019   Procedure: BRONCHIAL BRUSHINGS;  Surgeon: Collene Gobble, MD;  Location: [facility] ENDOSCOPY;  Service: Pulmonary;;  . BRONCHIAL NEEDLE ASPIRATION BIOPSY  08/20/2019   Procedure: BRONCHIAL NEEDLE ASPIRATION BIOPSIES;  Surgeon: Collene Gobble, MD;  Location: [facility] ENDOSCOPY;  Service: Pulmonary;;  . CARPAL TUNNEL RELEASE Right 01/09/2019   Procedure: CARPAL TUNNEL RELEASE;  Surgeon: Leandrew Koyanagi, MD;  Location: [facility];  Service: Orthopedics;  Laterality: Right;  . CATARACT EXTRACTION Right 2018  . COLONOSCOPY  2007   Dr. Benson Norway  . DIRECT LARYNGOSCOPY N/A 04/10/2020   Procedure: DIRECT LARYNGOSCOPY;  Surgeon: Melida Quitter, MD;  Location: [facility];  Service: ENT;  Laterality: N/A;  . I & D EXTREMITY Right 04/02/2016   Procedure: IRRIGATION AND DEBRIDEMENT GREAT TOE;  Surgeon: Leandrew Koyanagi, MD;  Location: [facility];  Service: Orthopedics;  Laterality: Right;  . IR IMAGING GUIDED PORT INSERTION  08/30/2019  . MULTIPLE TOOTH EXTRACTIONS    . RADIOACTIVE SEED IMPLANT  2003  . TRACHEOSTOMY TUBE PLACEMENT N/A 04/10/2020   Procedure: TRACHEOSTOMY;  Surgeon: Melida Quitter, MD;   Location: [facility];  Service: ENT;  Laterality: N/A;  . ULNAR TUNNEL RELEASE Right 01/09/2019   Procedure: RIGHT CUBITAL TUNNEL RELEASE AND CARPAL TUNNEL RELEASE;  Surgeon: Leandrew Koyanagi, MD;  Location: [facility];  Service: Orthopedics;  Laterality: Right;  . UPPER GASTROINTESTINAL ENDOSCOPY    . VIDEO BRONCHOSCOPY N/A 12/18/2019   Procedure: VIDEO BRONCHOSCOPY WITHOUT FLUORO;  Surgeon: Collene Gobble, MD;  Location: [facility] ENDOSCOPY;  Service: Cardiopulmonary;  Laterality: N/A;  . VIDEO BRONCHOSCOPY WITH ENDOBRONCHIAL ULTRASOUND N/A 08/20/2019   Procedure: VIDEO BRONCHOSCOPY WITH ENDOBRONCHIAL ULTRASOUND;  Surgeon: Collene Gobble, MD;  Location: [facility] ENDOSCOPY;  Service: Pulmonary;  Laterality: N/A;    REVIEW OF SYSTEMS:  A comprehensive review of systems was negative except for: Respiratory: positive for cough and sputum   PHYSICAL  EXAMINATION: General appearance: alert, cooperative and no distress Head: Normocephalic, without obvious abnormality, atraumatic Neck: no adenopathy, no JVD, supple, symmetrical, trachea midline and thyroid not enlarged, symmetric, no tenderness/mass/nodules Lymph nodes: Cervical, supraclavicular, and axillary nodes normal. Resp: clear to auscultation bilaterally Back: symmetric, no curvature. ROM normal. No CVA tenderness. Cardio: regular rate and rhythm, S1, S2 normal, no murmur, click, rub or gallop GI: soft, non-tender; bowel sounds normal; no masses,  no organomegaly Extremities: extremities normal, atraumatic, no cyanosis or edema  ECOG PERFORMANCE STATUS: 1 - Symptomatic but completely ambulatory  Blood pressure 122/61, pulse 72, temperature (!) 97.3 F (36.3 C), temperature source Tympanic, resp. rate 16, height '5\' 7"'  (1.702 m), weight 174 lb 1.6 oz (79 kg), SpO2 99 %.  LABORATORY DATA: Lab Results  Component Value Date   WBC 5.4 07/08/2020   HGB 9.6 (L) 07/08/2020   HCT 29.0 (L) 07/08/2020   MCV 93.9 07/08/2020   PLT 253 07/08/2020       Chemistry      Component Value Date/Time   NA 141 06/17/2020 0954   NA 132 (L) 08/14/2019 0918   K 3.5 06/17/2020 0954   CL 105 06/17/2020 0954   CO2 28 06/17/2020 0954   BUN 16 06/17/2020 0954   BUN 16 08/14/2019 0918   CREATININE 1.13 06/17/2020 0954   CREATININE 1.25 (H) 08/08/2016 0912      Component Value Date/Time   CALCIUM 9.3 06/17/2020 0954   ALKPHOS 57 06/17/2020 0954   AST 18 06/17/2020 0954   ALT 13 06/17/2020 0954   BILITOT 0.6 06/17/2020 0954       RADIOGRAPHIC STUDIES: No results found.  ASSESSMENT AND PLAN: This is a very pleasant 80 years old [name] with stage IV non-small cell Kennedy cancer, squamous cell carcinoma with negative PD-L1 expression. The patient is currently undergoing systemic chemotherapy with carboplatin and paclitaxel for 2 cycles in addition to immunotherapy with ipilimumab 1 mg/KG every 6 weeks and nivolumab 360 mg IV every 3 weeks status post 7 cycles. The patient has been tolerating his treatment with immunotherapy fairly well. I recommended for him to proceed with day 1 of cycle #8 today as planned. I will see the patient back for follow-up visit in 3 weeks for evaluation before starting day 22 of cycle #8. For the issues with the tracheostomy, he will follow-up with his ENT physician for evaluation and management. He was advised to call immediately if he has any other concerning symptoms in the interval. The patient voices understanding of current disease status and treatment options and is in agreement with the current care plan.  All questions were answered. The patient knows to call the clinic with any problems, questions or concerns. We can certainly see the patient much sooner if necessary.  Disclaimer: This note was dictated with voice recognition software. Similar sounding words can inadvertently be transcribed and may not be corrected upon review.

## 2020-07-15 ENCOUNTER — Encounter: Payer: Self-pay | Admitting: Internal Medicine

## 2020-07-16 DIAGNOSIS — J386 Stenosis of larynx: Secondary | ICD-10-CM | POA: Diagnosis not present

## 2020-07-16 DIAGNOSIS — Z930 Tracheostomy status: Secondary | ICD-10-CM | POA: Diagnosis not present

## 2020-07-16 DIAGNOSIS — C3432 Malignant neoplasm of lower lobe, left bronchus or lung: Secondary | ICD-10-CM | POA: Diagnosis not present

## 2020-07-16 DIAGNOSIS — J383 Other diseases of vocal cords: Secondary | ICD-10-CM | POA: Diagnosis not present

## 2020-07-23 DIAGNOSIS — J386 Stenosis of larynx: Secondary | ICD-10-CM | POA: Diagnosis not present

## 2020-07-23 DIAGNOSIS — C3432 Malignant neoplasm of lower lobe, left bronchus or lung: Secondary | ICD-10-CM | POA: Diagnosis not present

## 2020-07-23 DIAGNOSIS — M79642 Pain in left hand: Secondary | ICD-10-CM | POA: Diagnosis not present

## 2020-07-23 DIAGNOSIS — Z930 Tracheostomy status: Secondary | ICD-10-CM | POA: Diagnosis not present

## 2020-07-23 DIAGNOSIS — M79641 Pain in right hand: Secondary | ICD-10-CM | POA: Diagnosis not present

## 2020-07-27 NOTE — Progress Notes (Signed)
[facility] OFFICE PROGRESS NOTE  Shaun Kennedy, [place] [address]  DIAGNOSIS: Stage IV (T3, N2, M1 a) non-small cell Kennedy cancer, squamous cell carcinoma presented with obstructive left lower lobe Kennedy mass in addition to mediastinal lymphadenopathy as well as bilateral pulmonary nodules diagnosed in July 2021.   PDL1: 0%  PRIOR THERAPY: Palliative radiotherapy to the obstructive left lower lobe Kennedy mass under the care of Dr. Lisbeth Renshaw. Completed on 09/20/19  CURRENT THERAPY: Systemic chemotherapy 2 cycles of chemotherapy with carboplatin for an AUC of 5 and paclitaxel 175 mg/m2 in addition to immunotherapy with nivolumab 360 mg every 3 weeks and ipilimumab 1 mg/kg IV every 6 weeks followed by maintenance nivolumab and ipilimumab.  He started the first treatment on 09/04/2019.  He is status post Day 1 cycle #8 of the treatment.  INTERVAL HISTORY: Shaun Kennedy 80 y.o. male returns to the clinic today for a follow up visit. The patient is feeling well today without any concerning complaints.  He follows closely with ENT for his tracheostomy. The patient continues to tolerate treatment with immunotherapy well without any adverse side effects. Denies any fever, chills, night sweats, or weight loss. Denies any chest pain, cough, or hemoptysis. He has some cough associated with his tracheostomy tube if he puts the cap on it and has a hard time being comfortable with this. He denies changes with his shortness of breath. Denies any nausea, vomiting, constipation, or diarrhea. Denies any headache or visual changes. Denies any rashes or skin changes. The patient is here today for evaluation prior to starting cycle # 8 day 22.  MEDICAL HISTORY: Past Medical History:  Diagnosis Date   Allergic rhinitis    Asthma    Carotid stenosis    Colon cancer ([facility]) 2003   Diabetes mellitus ([place])    Diverticulosis    Dyslipidemia    ED (erectile dysfunction)    GERD  (gastroesophageal reflux disease)    H/O degenerative disc disease    Hemorrhoids    HTN (hypertension)    Hyperlipidemia    Kennedy cancer (HCC)    LVH (left ventricular hypertrophy)    on EKG   Mass of lower lobe of left Kennedy    Mediastinal adenopathy    Smoker    former   Wears dentures    Wears dentures    Wears glasses    Wears glasses     ALLERGIES:  has No Known Allergies.  MEDICATIONS:  Current Outpatient Medications  Medication Sig Dispense Refill   acetaminophen (TYLENOL) 500 MG tablet Take 1,000 mg by mouth every 4 (four) hours as needed.     amLODipine (NORVASC) 5 MG tablet Take 1 tablet (5 mg total) by mouth daily. 90 tablet 3   ANORO ELLIPTA 62.5-25 MCG/INH AEPB Inhale 1 puff into the lungs daily.     aspirin EC 81 MG tablet Take 81 mg by mouth daily after breakfast.      Blood Glucose Monitoring Suppl (ONETOUCH VERIO) w/Device KIT USE TO CHECK SUGAR DAILY 1 kit 0   ciprofloxacin (CIPRO) 500 MG tablet Take 500 mg by mouth 2 (two) times daily.     doxycycline (VIBRAMYCIN) 100 MG capsule Take 1 capsule (100 mg total) by mouth 2 (two) times daily. 14 capsule 0   dronabinol (MARINOL) 2.5 MG capsule Take 1 capsule (2.5 mg total) by mouth 2 (two) times daily before a meal. 60 capsule 1   ferrous sulfate 325 (65  FE) MG EC tablet Take 1 tablet (325 mg total) by mouth daily with breakfast. 30 tablet 12   HYDROcodone-acetaminophen (NORCO/VICODIN) 5-325 MG tablet Take 1 tablet by mouth every 4 (four) hours as needed. 10 tablet 0   Lancets (ONETOUCH DELICA PLUS [id number]) MISC PATIENT IS TO TEST TWO TIMES A DAY DX: E11.9 (ONETOUCH VERIO FLEX) 100 each 12   metFORMIN (GLUCOPHAGE-XR) 500 MG 24 hr tablet Take 1 tablet (500 mg total) by mouth daily. 90 tablet 1   methylPREDNISolone (MEDROL DOSEPAK) 4 MG TBPK tablet Use as instructed 21 tablet 0   Multiple Vitamin (MULTIVITAMIN WITH MINERALS) TABS tablet Take 1 tablet by mouth daily after breakfast.      polyethylene glycol (MIRALAX /  GLYCOLAX) 17 g packet Take 17 g by mouth daily as needed for moderate constipation. (Patient not taking: Reported on 05/29/2020) 14 each 0   senna-docusate (SENOKOT-S) 8.6-50 MG tablet Take 2 tablets by mouth 2 (two) times daily. (Patient not taking: Reported on 05/29/2020) 30 tablet 0   solifenacin (VESICARE) 5 MG tablet Take 1 tablet (5 mg total) by mouth daily. 90 tablet 1   No current facility-administered medications for this visit.   Facility-Administered Medications Ordered in Other Visits  Medication Dose Route Frequency Provider Last Rate Last Admin   sodium chloride flush (NS) 0.9 % injection 10 mL  10 mL Intracatheter PRN Curt Bears, MD   10 mL at 09/04/19 1556    SURGICAL HISTORY:  Past Surgical History:  Procedure Laterality Date   BRONCHIAL BIOPSY  08/20/2019   Procedure: BRONCHIAL BIOPSIES;  Surgeon: Collene Gobble, MD;  Location: [facility] ENDOSCOPY;  Service: Pulmonary;;   BRONCHIAL BRUSHINGS  08/20/2019   Procedure: BRONCHIAL BRUSHINGS;  Surgeon: Collene Gobble, MD;  Location[facility] ENDOSCOPY;  Service: Pulmonary;;   BRONCHIAL NEEDLE ASPIRATION BIOPSY  08/20/2019   Procedure: BRONCHIAL NEEDLE ASPIRATION BIOPSIES;  Surgeon: Collene Gobble, MD;  Location: [facility];  Service: Pulmonary;;   CARPAL TUNNEL RELEASE Right 01/09/2019   Procedure: CARPAL TUNNEL RELEASE;  Surgeon: Leandrew Koyanagi, MD;  Location: [facility];  Service: Orthopedics;  Laterality: Right;   CATARACT EXTRACTION Right 2018   COLONOSCOPY  2007   Dr. Benson Norway   DIRECT LARYNGOSCOPY N/A 04/10/2020   Procedure: DIRECT LARYNGOSCOPY;  Surgeon: Melida Quitter, MD;  Location: [facility] [facility];  Service: ENT;  Laterality: N/A;   I & D EXTREMITY Right 04/02/2016   Procedure: IRRIGATION AND DEBRIDEMENT GREAT TOE;  Surgeon: Leandrew Koyanagi, MD;  Location: [facility];  Service: Orthopedics;  Laterality: Right;   IR IMAGING GUIDED PORT INSERTION  08/30/2019   MULTIPLE TOOTH EXTRACTIONS     RADIOACTIVE SEED IMPLANT  2003   TRACHEOSTOMY  TUBE PLACEMENT N/A 04/10/2020   Procedure: TRACHEOSTOMY;  Surgeon: Melida Quitter, MD;  Location: [facility];  Service: ENT;  Laterality: N/A;   ULNAR TUNNEL RELEASE Right 01/09/2019   Procedure: RIGHT CUBITAL TUNNEL RELEASE AND CARPAL TUNNEL RELEASE;  Surgeon: Leandrew Koyanagi, MD;  Location: [facility];  Service: Orthopedics;  Laterality: Right;   UPPER GASTROINTESTINAL ENDOSCOPY     VIDEO BRONCHOSCOPY N/A 12/18/2019   Procedure: VIDEO BRONCHOSCOPY WITHOUT FLUORO;  Surgeon: Collene Gobble, MD;  Location: [facility] ENDOSCOPY;  Service: Cardiopulmonary;  Laterality: N/A;   VIDEO BRONCHOSCOPY WITH ENDOBRONCHIAL ULTRASOUND N/A 08/20/2019   Procedure: VIDEO BRONCHOSCOPY WITH ENDOBRONCHIAL ULTRASOUND;  Surgeon: Collene Gobble, MD;  Location: [facility] ENDOSCOPY;  Service: Pulmonary;  Laterality: N/A;    REVIEW OF  SYSTEMS:   Review of Systems  Constitutional: Negative for appetite change, chills, fatigue, fever and unexpected weight change.  HENT:   Negative for mouth sores, nosebleeds, sore throat and trouble swallowing.   Eyes: Negative for eye problems and icterus.  Respiratory: Negative for cough, hemoptysis, shortness of breath and wheezing.   Cardiovascular: Negative for chest pain and leg swelling.  Gastrointestinal: Negative for abdominal pain, constipation, diarrhea, nausea and vomiting.  Genitourinary: Negative for bladder incontinence, difficulty urinating, dysuria, frequency and hematuria.   Musculoskeletal: Negative for back pain, gait problem, neck pain and neck stiffness.  Skin: Negative for itching and rash.  Neurological: Negative for dizziness, extremity weakness, gait problem, headaches, light-headedness and seizures.  Hematological: Negative for adenopathy. Does not bruise/bleed easily.  Psychiatric/Behavioral: Negative for confusion, depression and sleep disturbance. The patient is not nervous/anxious.     PHYSICAL EXAMINATION:  Blood pressure (!) 108/52, pulse 85, temperature 97.6  F (36.4 C), temperature source Tympanic, resp. rate 18, height '5\' 7"'  (1.702 m), weight 175 lb (79.4 kg), SpO2 100 %.  ECOG PERFORMANCE STATUS: 1  Physical Exam  Constitutional: Oriented to person, place, and time and well-developed, well-nourished, and in no distress. No distress.  HENT:  Head: Normocephalic and atraumatic.  Mouth/Throat: Oropharynx is clear and moist. No oropharyngeal exudate. Tracheostomy in place.  Eyes: Conjunctivae are normal. Right eye exhibits no discharge. Left eye exhibits no discharge. No scleral icterus.  Neck: Normal range of motion. Neck supple.  Cardiovascular: Normal rate, regular rhythm, normal heart sounds and intact distal pulses.   Pulmonary/Chest: Effort normal and breath sounds normal. No respiratory distress. No wheezes. No rales.  Abdominal: Soft. Bowel sounds are normal. Exhibits no distension and no mass. There is no tenderness.  Musculoskeletal: Normal range of motion. Exhibits no edema.  Lymphadenopathy:    No cervical adenopathy.  Neurological: Alert and oriented to person, place, and time. Exhibits normal muscle tone. Gait normal. Coordination normal.  Skin: Skin is warm and dry. No rash noted. Not diaphoretic. No erythema. No pallor.  Psychiatric: Mood, memory and judgment normal.  Vitals reviewed.  LABORATORY DATA: Lab Results  Component Value Date   WBC 6.0 07/29/2020   HGB 10.6 (L) 07/29/2020   HCT 31.8 (L) 07/29/2020   MCV 93.5 07/29/2020   PLT 315 07/29/2020      Chemistry      Component Value Date/Time   NA 139 07/29/2020 1051   NA 132 (L) 08/14/2019 0918   K 3.6 07/29/2020 1051   CL 102 07/29/2020 1051   CO2 27 07/29/2020 1051   BUN 12 07/29/2020 1051   BUN 16 08/14/2019 0918   CREATININE 1.22 07/29/2020 1051   CREATININE 1.25 (H) 08/08/2016 0912      Component Value Date/Time   CALCIUM 9.1 07/29/2020 1051   ALKPHOS 61 07/29/2020 1051   AST 16 07/29/2020 1051   ALT 12 07/29/2020 1051   BILITOT 0.6 07/29/2020  1051       RADIOGRAPHIC STUDIES:  No results found.   ASSESSMENT/PLAN:  This is a very pleasant 80 year old [name] diagnosed with stage IV (T3, N2, M1a) non-small cell Kennedy cancer, squamous cell carcinoma.  He presented with a left lower lobe obstructive Kennedy mass in addition to mediastinal lymphadenopathy.  He also presented with bilateral pulmonary nodules.  He was diagnosed in July 2021.  His PD-L1 expression is negative.   He completed palliative radiotherapy to the obstructive Kennedy mass under the care of Dr. Lisbeth Renshaw in August 2021.  The patient is currently undergoing systemic chemotherapy with carboplatin and paclitaxel for 2 cycles in addition to immunotherapy with ipilimumab 1 mg/KG every 6 weeks and nivolumab 360 mg IV every 3 weeks.  He is status post day 1 of cycle 8  Labs were reviewed. Recommend that he proceed with day 22 cycle #8 today as scheduled.  We will see him back for a follow up visit in 3 weeks for evaluation before starting cycle #1 day 9  I will arrange for a restaging CT scan of the chest, abdomen, and pelvis prior to starting his next cycle of treatment.   He will continue to follow with ENT for his tracheostomy.    The patient was advised to call immediately if he has any concerning symptoms in the interval. The patient voices understanding of current disease status and treatment options and is in agreement with the current care plan. All questions were answered. The patient knows to call the clinic with any problems, questions or concerns. We can certainly see the patient much sooner if necessary             Orders Placed This Encounter  Procedures   CT Chest W Contrast    Standing Status:   Future    Standing Expiration Date:   07/29/2021    Order Specific Question:   If indicated for the ordered procedure, I authorize the administration of contrast media per Radiology protocol    Answer:   Yes    Order Specific Question:    Preferred imaging location?    Answer:   [facility]   CT Abdomen Pelvis W Contrast    Standing Status:   Future    Standing Expiration Date:   07/29/2021    Scheduling Instructions:     Schedule around 7/7-7/11.    Order Specific Question:   If indicated for the ordered procedure, I authorize the administration of contrast media per Radiology protocol    Answer:   Yes    Order Specific Question:   Preferred imaging location?    Answer:   [facility]    Order Specific Question:   Release to patient    Answer:   Immediate    Order Specific Question:   Is Oral Contrast requested for this exam?    Answer:   Yes, Per Radiology protocol     The total time spent in the appointment was 20-29 minutes in this encounter.   Nasirah Sachs L Kaylynne Andres, PA-C 07/29/20

## 2020-07-28 ENCOUNTER — Ambulatory Visit: Payer: Medicare HMO | Admitting: Family Medicine

## 2020-07-29 ENCOUNTER — Inpatient Hospital Stay (HOSPITAL_BASED_OUTPATIENT_CLINIC_OR_DEPARTMENT_OTHER): Payer: Medicare HMO | Admitting: Physician Assistant

## 2020-07-29 ENCOUNTER — Other Ambulatory Visit: Payer: Self-pay | Admitting: Physician Assistant

## 2020-07-29 ENCOUNTER — Inpatient Hospital Stay: Payer: Medicare HMO

## 2020-07-29 ENCOUNTER — Other Ambulatory Visit: Payer: Self-pay

## 2020-07-29 ENCOUNTER — Ambulatory Visit: Payer: Medicare HMO

## 2020-07-29 VITALS — BP 108/52 | HR 85 | Temp 97.6°F | Resp 18 | Ht 67.0 in | Wt 175.0 lb

## 2020-07-29 DIAGNOSIS — E119 Type 2 diabetes mellitus without complications: Secondary | ICD-10-CM | POA: Diagnosis not present

## 2020-07-29 DIAGNOSIS — Z5112 Encounter for antineoplastic immunotherapy: Secondary | ICD-10-CM | POA: Diagnosis not present

## 2020-07-29 DIAGNOSIS — C3432 Malignant neoplasm of lower lobe, left bronchus or lung: Secondary | ICD-10-CM

## 2020-07-29 DIAGNOSIS — I10 Essential (primary) hypertension: Secondary | ICD-10-CM | POA: Diagnosis not present

## 2020-07-29 DIAGNOSIS — Z7952 Long term (current) use of systemic steroids: Secondary | ICD-10-CM | POA: Diagnosis not present

## 2020-07-29 DIAGNOSIS — C3490 Malignant neoplasm of unspecified part of unspecified bronchus or lung: Secondary | ICD-10-CM

## 2020-07-29 DIAGNOSIS — Z7982 Long term (current) use of aspirin: Secondary | ICD-10-CM | POA: Diagnosis not present

## 2020-07-29 DIAGNOSIS — J45909 Unspecified asthma, uncomplicated: Secondary | ICD-10-CM | POA: Diagnosis not present

## 2020-07-29 DIAGNOSIS — Z87891 Personal history of nicotine dependence: Secondary | ICD-10-CM | POA: Diagnosis not present

## 2020-07-29 DIAGNOSIS — E785 Hyperlipidemia, unspecified: Secondary | ICD-10-CM | POA: Diagnosis not present

## 2020-07-29 LAB — CBC WITH DIFFERENTIAL (CANCER CENTER ONLY)
Abs Immature Granulocytes: 0.06 10*3/uL (ref 0.00–0.07)
Basophils Absolute: 0 10*3/uL (ref 0.0–0.1)
Basophils Relative: 0 %
Eosinophils Absolute: 0.3 10*3/uL (ref 0.0–0.5)
Eosinophils Relative: 5 %
HCT: 31.8 % — ABNORMAL LOW (ref 39.0–52.0)
Hemoglobin: 10.6 g/dL — ABNORMAL LOW (ref 13.0–17.0)
Immature Granulocytes: 1 %
Lymphocytes Relative: 24 %
Lymphs Abs: 1.4 10*3/uL (ref 0.7–4.0)
MCH: 31.2 pg (ref 26.0–34.0)
MCHC: 33.3 g/dL (ref 30.0–36.0)
MCV: 93.5 fL (ref 80.0–100.0)
Monocytes Absolute: 0.5 10*3/uL (ref 0.1–1.0)
Monocytes Relative: 8 %
Neutro Abs: 3.7 10*3/uL (ref 1.7–7.7)
Neutrophils Relative %: 62 %
Platelet Count: 315 10*3/uL (ref 150–400)
RBC: 3.4 MIL/uL — ABNORMAL LOW (ref 4.22–5.81)
RDW: 12.2 % (ref 11.5–15.5)
WBC Count: 6 10*3/uL (ref 4.0–10.5)
nRBC: 0 % (ref 0.0–0.2)

## 2020-07-29 LAB — CMP (CANCER CENTER ONLY)
ALT: 12 U/L (ref 0–44)
AST: 16 U/L (ref 15–41)
Albumin: 3.4 g/dL — ABNORMAL LOW (ref 3.5–5.0)
Alkaline Phosphatase: 61 U/L (ref 38–126)
Anion gap: 10 (ref 5–15)
BUN: 12 mg/dL (ref 8–23)
CO2: 27 mmol/L (ref 22–32)
Calcium: 9.1 mg/dL (ref 8.9–10.3)
Chloride: 102 mmol/L (ref 98–111)
Creatinine: 1.22 mg/dL (ref 0.61–1.24)
GFR, Estimated: 60 mL/min — ABNORMAL LOW (ref >60)
Glucose, Bld: 133 mg/dL — ABNORMAL HIGH (ref 70–99)
Potassium: 3.6 mmol/L (ref 3.5–5.1)
Sodium: 139 mmol/L (ref 135–145)
Total Bilirubin: 0.6 mg/dL (ref 0.3–1.2)
Total Protein: 8.4 g/dL — ABNORMAL HIGH (ref 6.5–8.1)

## 2020-07-29 LAB — TSH: TSH: 1.135 u[IU]/mL (ref 0.320–4.118)

## 2020-07-29 MED ORDER — SODIUM CHLORIDE 0.9 % IV SOLN
Freq: Once | INTRAVENOUS | Status: AC
  Filled 2020-07-29: qty 250

## 2020-07-29 MED ORDER — SODIUM CHLORIDE 0.9% FLUSH
10.0000 mL | INTRAVENOUS | Status: DC | PRN
  Administered 2020-07-29: 10 mL
  Filled 2020-07-29: qty 10

## 2020-07-29 MED ORDER — SODIUM CHLORIDE 0.9 % IV SOLN
360.0000 mg | Freq: Once | INTRAVENOUS | Status: AC
  Administered 2020-07-29: 360 mg via INTRAVENOUS
  Filled 2020-07-29: qty 24

## 2020-07-29 MED ORDER — HEPARIN SOD (PORK) LOCK FLUSH 100 UNIT/ML IV SOLN
500.0000 [IU] | Freq: Once | INTRAVENOUS | Status: AC | PRN
  Administered 2020-07-29: 500 [IU]
  Filled 2020-07-29: qty 5

## 2020-07-29 NOTE — Patient Instructions (Signed)
[facility] ONCOLOG[facility]  Discharge Instructions: Thank you for choosing [facility] to provide your oncology and hematology care.   If you have a lab appointment with the [facility], please go directly to the [facility] and check in at the registration area.   Wear comfortable clothing and clothing appropriate for easy access to any Portacath or PICC line.   We strive to give you quality time with your provider. You may need to reschedule your appointment if you arrive late (15 or more minutes).  Arriving late affects you and other patients whose appointments are after yours.  Also, if you miss three or more appointments without notifying the office, you may be dismissed from the clinic at the provider's discretion.      For prescription refill requests, have your pharmacy contact our office and allow 72 hours for refills to be completed.    Today you received the following chemotherapy and/or immunotherapy agents: opdivo     To help prevent nausea and vomiting after your treatment, we encourage you to take your nausea medication as directed.  BELOW ARE SYMPTOMS THAT SHOULD BE REPORTED IMMEDIATELY: *FEVER GREATER THAN 100.4 F (38 C) OR HIGHER *CHILLS OR SWEATING *NAUSEA AND VOMITING THAT IS NOT CONTROLLED WITH YOUR NAUSEA MEDICATION *UNUSUAL SHORTNESS OF BREATH *UNUSUAL BRUISING OR BLEEDING *URINARY PROBLEMS (pain or burning when urinating, or frequent urination) *BOWEL PROBLEMS (unusual diarrhea, constipation, pain near the anus) TENDERNESS IN MOUTH AND THROAT WITH OR WITHOUT PRESENCE OF ULCERS (sore throat, sores in mouth, or a toothache) UNUSUAL RASH, SWELLING OR PAIN  UNUSUAL VAGINAL DISCHARGE OR ITCHING   Items with * indicate a potential emergency and should be followed up as soon as possible or go to the Emergency Department if any problems should occur.  Please show the CHEMOTHERAPY ALERT CARD or IMMUNOTHERAPY ALERT CARD at check-in to the  Emergency Department and triage nurse.  Should you have questions after your visit or need to cancel or reschedule your appointment, please contact [facility]  Dept: [phone]  and follow the prompts.  Office hours are 8:00 a.m. to 4:30 p.m. Monday - Friday. Please note that voicemails left after 4:00 p.m. may not be returned until the following business day.  We are closed weekends and major holidays. You have access to a nurse at all times for urgent questions. Please call the main number to the clinic Dept: [phone] and follow the prompts.   For any non-urgent questions, you may also contact your provider using MyChart. We now offer e-Visits for anyone 76 and older to request care online for non-urgent symptoms. For details visit [email or web address].   Also download the MyChart app! Go to the app store, search "MyChart", open the app, select [facility], and log in with your MyChart username and password.  Due to Covid, a mask is required upon entering the hospital/clinic. If you do not have a mask, one will be given to you upon arrival. For doctor visits, patients may have 1 support person aged 55 or older with them. For treatment visits, patients cannot have anyone with them due to current Covid guidelines and our immunocompromised population.

## 2020-07-30 ENCOUNTER — Ambulatory Visit (INDEPENDENT_AMBULATORY_CARE_PROVIDER_SITE_OTHER): Payer: Medicare HMO | Admitting: Family Medicine

## 2020-07-30 VITALS — BP 102/68 | HR 74 | Temp 97.4°F | Ht 67.0 in | Wt 174.6 lb

## 2020-07-30 DIAGNOSIS — I152 Hypertension secondary to endocrine disorders: Secondary | ICD-10-CM | POA: Diagnosis not present

## 2020-07-30 DIAGNOSIS — Z79899 Other long term (current) drug therapy: Secondary | ICD-10-CM

## 2020-07-30 DIAGNOSIS — N3281 Overactive bladder: Secondary | ICD-10-CM | POA: Diagnosis not present

## 2020-07-30 DIAGNOSIS — E119 Type 2 diabetes mellitus without complications: Secondary | ICD-10-CM | POA: Diagnosis not present

## 2020-07-30 DIAGNOSIS — I700 Atherosclerosis of aorta: Secondary | ICD-10-CM | POA: Diagnosis not present

## 2020-07-30 DIAGNOSIS — E1169 Type 2 diabetes mellitus with other specified complication: Secondary | ICD-10-CM | POA: Diagnosis not present

## 2020-07-30 DIAGNOSIS — C3432 Malignant neoplasm of lower lobe, left bronchus or lung: Secondary | ICD-10-CM

## 2020-07-30 DIAGNOSIS — E1159 Type 2 diabetes mellitus with other circulatory complications: Secondary | ICD-10-CM

## 2020-07-30 DIAGNOSIS — E1121 Type 2 diabetes mellitus with diabetic nephropathy: Secondary | ICD-10-CM | POA: Diagnosis not present

## 2020-07-30 DIAGNOSIS — E785 Hyperlipidemia, unspecified: Secondary | ICD-10-CM

## 2020-07-30 LAB — POCT GLYCOSYLATED HEMOGLOBIN (HGB A1C): Hemoglobin A1C: 6 % — AB (ref 4.0–5.6)

## 2020-07-30 NOTE — Progress Notes (Signed)
  Subjective:    Patient ID: Shaun Kennedy, male    DOB: 1940-07-06, 80 y.o.   MRN: [id number]  Shaun Kennedy is a 80 y.o. male who presents for follow-up of Type 2 diabetes mellitus.  Home blood sugar records: fasting range: 91-112 Current symptoms/problems include none and have been stable. Daily foot checks: yes   Any foot concerns: none Exercise: walking 20 min a day Diet: He is using Marinol to help with his appetite and has actually gained some weight over the last several months. He is no longer using Anoro and is having no difficulty with breathing.  He continues on metformin as well as amlodipine, Vesicare and also taking simvastatin.  He continues to be followed by Dr. Earlie Server for his underlying lung cancer. The following portions of the patient's history were reviewed and updated as appropriate: allergies, current medications, past medical history, past social history and problem list.  ROS as in subjective above.     Objective:    Physical Exam Alert and in no distress tracheostomy tube present. Blood pressure 102/68, pulse 74, temperature (!) 97.4 F (36.3 C), height 5\' 7"  (1.702 m), weight 174 lb 9.6 oz (79.2 kg), SpO2 96 %.  Lab Review Diabetic Labs Latest Ref Rng & Units 07/29/2020 07/08/2020 06/17/2020 05/29/2020 05/27/2020  HbA1c 4.0 - 5.6 % - - - - -  Microalbumin mg/L - - - - -  Micro/Creat Ratio - - - - - -  Chol 100 - 199 mg/dL - - - - -  HDL >39 mg/dL - - - - -  Calc LDL 0 - 99 mg/dL - - - - -  Triglycerides 0 - 149 mg/dL - - - - -  Creatinine 0.61 - 1.24 mg/dL 1.22 1.07 1.13 1.03 1.22  GFR >60.00 mL/min - - - - -   BP/Weight 07/30/2020 07/29/2020 07/08/2020 06/17/2020 05/31/5359  Systolic BP 443 154 008 676 195  Diastolic BP 68 52 61 66 70  Wt. (Lbs) 174.6 175 174.1 173.6 171  BMI 27.35 27.41 27.27 27.19 26.78   Foot/eye exam completion dates Latest Ref Rng & Units 07/30/2020 01/24/2019  Eye Exam No Retinopathy - Retinopathy(A)  Foot Form Completion - Done -   Hemoglobin A1c is 6.0.  [place]  reports that he quit smoking about 32 years ago. He started smoking about 65 years ago. He has a 32.00 pack-year smoking history. He has never used smokeless tobacco. He reports that he does not drink alcohol and does not use drugs.     Assessment & Plan:    Type 2 diabetes mellitus without complication, without long-term current use of insulin (HCC)  Malignant neoplasm of lower lobe of left lung (HCC)  Medication management  Hypertension associated with diabetes ([place])  Atherosclerosis of aorta ([place])  Hyperlipidemia associated with type 2 diabetes mellitus ([place])  Diabetes mellitus type II, non insulin dependent ([place])  OAB (overactive bladder)  Diabetic nephropathy associated with type 2 diabetes mellitus ([place])   Rx changes: none Education: Reviewed 'ABCs' of diabetes management (respective goals in parentheses):  A1C (<7), blood pressure (<130/80), and cholesterol (LDL <100). Compliance at present is estimated to be good. Efforts to improve compliance (if necessary) will be directed at  none . Follow up: 6 months He seems to be holding his own on his present medication regimen and I will therefore continue him on metformin, amlodipine, Vesicare and simvastatin.  See him in 6 months.

## 2020-08-04 ENCOUNTER — Encounter: Payer: Self-pay | Admitting: Neurology

## 2020-08-06 DIAGNOSIS — H40013 Open angle with borderline findings, low risk, bilateral: Secondary | ICD-10-CM | POA: Diagnosis not present

## 2020-08-06 DIAGNOSIS — Z961 Presence of intraocular lens: Secondary | ICD-10-CM | POA: Diagnosis not present

## 2020-08-06 DIAGNOSIS — H35033 Hypertensive retinopathy, bilateral: Secondary | ICD-10-CM | POA: Diagnosis not present

## 2020-08-06 DIAGNOSIS — E119 Type 2 diabetes mellitus without complications: Secondary | ICD-10-CM | POA: Diagnosis not present

## 2020-08-06 DIAGNOSIS — H18893 Other specified disorders of cornea, bilateral: Secondary | ICD-10-CM | POA: Diagnosis not present

## 2020-08-07 ENCOUNTER — Other Ambulatory Visit: Payer: Self-pay

## 2020-08-07 NOTE — Patient Outreach (Signed)
[facility]) Care Management  08/07/2020  Shaun Kennedy 09-Dec-1940 [id number]   Telephone Assessment  Successful call placed to patient. Spoke with patient who reports things are going well. He denies any acute issues or concerns. He has had several MD appts within the last month dn report the went well. He continues to reside in the home with supportive spouse. Patient shares he has been able to do more for himself in regards to managing his care. No recent falls. He is walking outside the home for physical activity. He denies any RN CM needs or concerns at this time.   Medications Reviewed Today     Reviewed by [name], RN (Registered Nurse) on 08/07/20 at [phone]  Med List Status: <None>   Medication Order Taking? Sig Documenting Provider Last Dose Status Informant  acetaminophen (TYLENOL) 500 MG tablet [id number] No Take 1,000 mg by mouth every 4 (four) hours as needed.  Patient not taking: Reported on 07/30/2020   [provider] Not Taking Active Self  amLODipine (NORVASC) 5 MG tablet [id number] No Take 1 tablet (5 mg total) by mouth daily. [name], MD Taking Active Spouse/Significant Other  ANORO ELLIPTA 62.5-25 MCG/INH AEPB [id number] No Inhale 1 puff into the lungs daily.  Patient not taking: Reported on 07/30/2020   [provider] Not Taking Active   aspirin EC 81 MG tablet [id number] No Take 81 mg by mouth daily after breakfast.  [provider] Taking Active Spouse/Significant Other  Blood Glucose Monitoring Suppl [facility] VERIO) w/Device KIT [id number] No USE TO CHECK SUGAR DAILY [name], MD Taking Active Spouse/Significant Other  ciprofloxacin (CIPRO) 500 MG tablet [id number] No Take 500 mg by mouth 2 (two) times daily.  Patient not taking: Reported on 07/30/2020   [provider] Not Taking Active   doxycycline (VIBRAMYCIN) 100 MG capsule [id number] No Take 1 capsule (100 mg total) by mouth 2  (two) times daily.  Patient not taking: Reported on 07/30/2020   Isla Pence, MD Not Taking Active   dronabinol (MARINOL) 2.5 MG capsule [id number] No Take 1 capsule (2.5 mg total) by mouth 2 (two) times daily before a meal. [name], MD Taking Active   ferrous sulfate 325 (65 FE) MG EC tablet [id number] No Take 1 tablet (325 mg total) by mouth daily with breakfast. [name], MD Taking Active Spouse/Significant Other  HYDROcodone-acetaminophen (NORCO/VICODIN) 5-325 MG tablet [id number] No Take 1 tablet by mouth every 4 (four) hours as needed. Isla Pence, MD Taking Active   Lancets (ONETOUCH DELICA PLUS SEGBTD17O) [place] [id number] No PATIENT IS TO TEST TWO TIMES A DAY DX: E11.9 (ONETOUCH VERIO FLEX) [name], MD Taking Active Spouse/Significant Other  metFORMIN (GLUCOPHAGE-XR) 500 MG 24 hr tablet [id number] No Take 1 tablet (500 mg total) by mouth daily. [name], MD Taking Active Spouse/Significant Other  methylPREDNISolone (MEDROL DOSEPAK) 4 MG TBPK tablet [id number] No Use as instructed  Patient not taking: Reported on 07/30/2020   Curt Bears, MD Not Taking Active   Multiple Vitamin (MULTIVITAMIN WITH MINERALS) TABS tablet [id number] No Take 1 tablet by mouth daily after breakfast.  [provider] Taking Active Spouse/Significant Other  polyethylene glycol (MIRALAX / GLYCOLAX) 17 g packet [id number] No Take 17 g by mouth daily as needed for moderate constipation.  Patient not taking: No sig reported   Nita Sells, MD Not Taking Active   senna-docusate (SENOKOT-S) 8.6-50 MG tablet [id number] No Take 2 tablets by mouth  2 (two) times daily.  Patient not taking: No sig reported   Nita Sells, MD Not Taking Active   solifenacin (VESICARE) 5 MG tablet [id number] No Take 1 tablet (5 mg total) by mouth daily. [name], MD Taking Active               Goals Addressed               This Visit's Progress     COMPLETED:  (THN)Anorexia, Nausea and Vomiting Managed (pt-stated)          Timeframe:  Short-Term Goal Priority:  High Start Date:   06/08/20                          Expected End Date:   June 2022 Follow Up Date: June 2022                     Barriers: Health Behaviors Knowledge   -patient/family will be able to verbalize at least 2-3 ways to manage chemo txs SEs -pt will maintain wgt  Evidence-based guidance:  Prepare patient for treatment aimed at prevention of nausea and vomiting throughout the period of risk, based on chemotherapy agent and treatment protocol during every treatment cycle.  Assess characteristics that may differentiate between chemotherapy-induced nausea and vomiting (acute or delayed), radiation treatment-induced nausea and vomiting, anticipatory or breakthrough nausea and vomiting.  Prepare patient for pharmacologic therapy, such as antiemetic, anxiolytic, H2 blocker, proton pump inhibitor, steroid, benzodiazepine, neurokinin-1 receptor antagonist, cannabinoid, atypical antipsychotic, phenothiazine or serotonin   receptor antagonist.  Monitor efficacy and manage side effects.  Consider around-the-clock versus PRN dosing.  Promote high-protein and high-calorie foods, small frequent meals, bland foods, beverages from a covered cup with a straw, the use of a fan directed toward face for nausea prevention.  Encourage the avoidance of greasy, fried foods and those with strong odors.  Promote the use of nonpharmacologic interventions, such as relaxation, guided imagery, acupuncture, acupressure, music therapy, as well as psychologic and educational support or information.  Provide or refer for individualized medical nutrition assessment and therapy.  Assess and trend hydration status, fluid balance and weight; monitor for signs/symptoms of dehydration.  Advocate for home intravenous fluids when necessary.  Encourage the keeping of a symptom journal that includes what antiemetic  therapy is effective for specific chemotherapy agent.  Consider nutrition support options at the first sign of weight or appetite loss that include food fortification, oral nutrition support, supplemental tube feeding and total parenteral nutrition.   Notes:   06/08/2020-Patient to resume chemo txs within the next few weeks. Caregivers report he did fairly well with first few rounds and minimal SEs.   07/08/2020-Patient has started chemo txs-about one week so far. He reports so far he ahs been abel to tolerate them. Appeiite has been WNL. He denies any GI sxs at present. He goes back today for another tx.   08/07/20-Patient continues to undergo chemo txs. States he is tolerating them well with no SEs. Appetite remains good. He denies any wgt loss and GI sxs       (THN)Follow My Treatment Plan-Chemotherapy Adherence (pt-stated)        Timeframe:  Short-Term Goal Priority:  High Start Date:  08/07/2020                           Expected End Date:Aug 2022  Follow Up Date:Aug 2022   Barriers: Knowledge   - call the doctor or nurse to get help with side effects - keep a list of all the medicines I take; vitamins and herbals too - keep follow-up appointments    Why is this important?   Following your treatment plan will help keep your care on track.  Medicine may be the most important piece of your plan.  There are many reasons why you might want to stop taking medicine. You may get tired of taking your medicine. You may think medicine costs too much money. You may find the side effects are too much to bear.  Try some of these steps to make following the treatment plan a little easier.     Notes:   08/07/20-Patient tolerating chemo txs well so far at present. Denies any SEs. He is due for upcoming scans to assess status of cancer within the next few months.        (THN)Optimal Respiratory Status (pt-stated)          Timeframe:  Short-Term Goal Priority:  High Start  Date:      07/08/2020                       Expected End Date: Aug 2022 Follow Up Date: Aug2022   Barriers: Health Behaviors Knowledge                        -pt/family will perform trach care at home -pt/family will monitor for s/s of infection or impaired resp status -pt will f/u with ENT as ordered   Evidence-based guidance:   Anticipate/prepare caregiver for hospitalization with persistent symptoms including increased respiratory effort, tachypnea, hypoxemia, apnea, feeding intolerance; consider presence of severe disease risk factors.  Encourage caregiver to clear nasal congestion (especially before feeding) based on work of breathing.  Use techniques, such as superficial nasal suction, elevating head of bed when able, making frequent position changes, decreasing stimulation, clustering care to conserve energy and increase oxygenation, promoting rest.   If high-risk, anticipate/prepare child and caregiver for administration of palivizumab administered intramuscularly once a month through respiratory syncytial virus (RSV) season to a maximum of 5 doses.  Encourage caregiver tobacco cessation; support avoidance of second-hand smoke exposure.  Promote infection prevention techniques: caregiver handwashing, avoiding contact with those with respiratory illnesses and/or second-hand smoke exposure and continuing breastfeeding until at least 57 months of age.  Provide anticipatory guidance to parent/caregiver regarding awareness of worsening signs/symptoms and when to call provider; acknowledge fear and provide support and reassurance.    Notes:   07/08/2020-Patient with complaints of phlegm/mucus despite performing freq trach care throughout the day. He is going to be evaluated by MD today. Denies any s/s of resp infection.   08/07/20-Patient report trach intact and no recent issues. He saw ENT a few wks ago. He continue to have mucus production-MD aware and advised normal. No s/s of  infection. SOB managed at present.        (THN)Set My Target A1C-Diabetes Type 2 (pt-stated)        Timeframe:  Long-Range Goal Priority:  Medium Start Date: 04/27/2020                            Expected End Date:  Dec 2022  Follow Up Date Aug 2022    Barriers: Health Behaviors Knowledge   - set target A1C  -monitor cbgs as ordered -complete MD appts as ordered   Why is this important?   Your target A1C is decided together by you and your doctor.  It is based on several things like your age and other health issues.    Notes:  04/27/2020-Patient went for PCP appt today and had A1C level drawn-results pending. Last A1C on file 5.9(Jan 2022).  05/12/2020-Spouse reports appetite gradually increasing-blood sugars WNL. A1C lab result was 6.2(04/27/20)  06/08/20-Caregivers report pt continues to maintain good appetite. Blood sugars have ben in the low to mid 100s.  07/08/2020-Patient reports appetite remains good and WNL for him. Wgt stable. Family monitoring cbgs but unsure of reading this morning-unable to recall. No current labwork on file.   08/07/20-Patient saw PCP recently and had labwork done. A1C 6.0 down from 6.5. Pt/family continues to monitor cbgs in the home. He voices adherence to diabetic diet.           Plan: RN CM discussed with patient next outreach within six to eight weeks. Patient gave verbal consent and in agreement with RN CM follow up and timeframe. Patient aware that they may contact RN CM sooner for any issues or concerns. RN CM reviewed goals and plan of care with patient. Patient agrees to care plan and follow up. RN CM will send quarterly update to PCP.  [name], RN,BSN,CCM [facility] Telephonic Care Management Coordinator Direct Phone: [phone] Toll Free: [phone] Fax: [phone]

## 2020-08-15 ENCOUNTER — Other Ambulatory Visit: Payer: Self-pay | Admitting: Family Medicine

## 2020-08-15 DIAGNOSIS — J383 Other diseases of vocal cords: Secondary | ICD-10-CM | POA: Diagnosis not present

## 2020-08-15 DIAGNOSIS — C3432 Malignant neoplasm of lower lobe, left bronchus or lung: Secondary | ICD-10-CM | POA: Diagnosis not present

## 2020-08-15 DIAGNOSIS — Z930 Tracheostomy status: Secondary | ICD-10-CM | POA: Diagnosis not present

## 2020-08-15 DIAGNOSIS — J386 Stenosis of larynx: Secondary | ICD-10-CM | POA: Diagnosis not present

## 2020-08-15 DIAGNOSIS — E119 Type 2 diabetes mellitus without complications: Secondary | ICD-10-CM

## 2020-08-18 DIAGNOSIS — J386 Stenosis of larynx: Secondary | ICD-10-CM | POA: Diagnosis not present

## 2020-08-18 DIAGNOSIS — C3432 Malignant neoplasm of lower lobe, left bronchus or lung: Secondary | ICD-10-CM | POA: Diagnosis not present

## 2020-08-18 DIAGNOSIS — Z930 Tracheostomy status: Secondary | ICD-10-CM | POA: Diagnosis not present

## 2020-08-19 ENCOUNTER — Other Ambulatory Visit: Payer: Self-pay

## 2020-08-19 ENCOUNTER — Inpatient Hospital Stay: Payer: Medicare HMO | Attending: Internal Medicine

## 2020-08-19 ENCOUNTER — Ambulatory Visit
Admission: RE | Admit: 2020-08-19 | Discharge: 2020-08-19 | Disposition: A | Discharge status: Home / Self Care | Payer: Medicare HMO | Source: Ambulatory Visit | Attending: Physician Assistant | Admitting: Physician Assistant

## 2020-08-19 ENCOUNTER — Inpatient Hospital Stay (HOSPITAL_BASED_OUTPATIENT_CLINIC_OR_DEPARTMENT_OTHER): Payer: Medicare HMO | Admitting: Internal Medicine

## 2020-08-19 ENCOUNTER — Other Ambulatory Visit: Payer: Medicare HMO

## 2020-08-19 ENCOUNTER — Encounter: Payer: Self-pay | Admitting: Internal Medicine

## 2020-08-19 ENCOUNTER — Inpatient Hospital Stay: Payer: Medicare HMO

## 2020-08-19 VITALS — BP 131/66 | HR 96 | Temp 99.9°F | Resp 19 | Ht 67.0 in | Wt 176.7 lb

## 2020-08-19 DIAGNOSIS — C3432 Malignant neoplasm of lower lobe, left bronchus or lung: Secondary | ICD-10-CM

## 2020-08-19 DIAGNOSIS — R911 Solitary pulmonary nodule: Secondary | ICD-10-CM | POA: Diagnosis not present

## 2020-08-19 DIAGNOSIS — Z79899 Other long term (current) drug therapy: Secondary | ICD-10-CM | POA: Insufficient documentation

## 2020-08-19 DIAGNOSIS — E119 Type 2 diabetes mellitus without complications: Secondary | ICD-10-CM | POA: Insufficient documentation

## 2020-08-19 DIAGNOSIS — Z87891 Personal history of nicotine dependence: Secondary | ICD-10-CM | POA: Insufficient documentation

## 2020-08-19 DIAGNOSIS — Z7982 Long term (current) use of aspirin: Secondary | ICD-10-CM | POA: Diagnosis not present

## 2020-08-19 DIAGNOSIS — K4090 Unilateral inguinal hernia, without obstruction or gangrene, not specified as recurrent: Secondary | ICD-10-CM | POA: Diagnosis not present

## 2020-08-19 DIAGNOSIS — Z5112 Encounter for antineoplastic immunotherapy: Secondary | ICD-10-CM | POA: Diagnosis not present

## 2020-08-19 DIAGNOSIS — Z8546 Personal history of malignant neoplasm of prostate: Secondary | ICD-10-CM | POA: Diagnosis not present

## 2020-08-19 DIAGNOSIS — Z7984 Long term (current) use of oral hypoglycemic drugs: Secondary | ICD-10-CM | POA: Insufficient documentation

## 2020-08-19 DIAGNOSIS — J90 Pleural effusion, not elsewhere classified: Secondary | ICD-10-CM | POA: Diagnosis not present

## 2020-08-19 DIAGNOSIS — N3289 Other specified disorders of bladder: Secondary | ICD-10-CM | POA: Diagnosis not present

## 2020-08-19 DIAGNOSIS — C3490 Malignant neoplasm of unspecified part of unspecified bronchus or lung: Secondary | ICD-10-CM

## 2020-08-19 DIAGNOSIS — E785 Hyperlipidemia, unspecified: Secondary | ICD-10-CM | POA: Insufficient documentation

## 2020-08-19 DIAGNOSIS — I10 Essential (primary) hypertension: Secondary | ICD-10-CM | POA: Insufficient documentation

## 2020-08-19 DIAGNOSIS — I2510 Atherosclerotic heart disease of native coronary artery without angina pectoris: Secondary | ICD-10-CM | POA: Diagnosis not present

## 2020-08-19 LAB — CMP (CANCER CENTER ONLY)
ALT: 9 U/L (ref 0–44)
AST: 13 U/L — ABNORMAL LOW (ref 15–41)
Albumin: 3.2 g/dL — ABNORMAL LOW (ref 3.5–5.0)
Alkaline Phosphatase: 57 U/L (ref 38–126)
Anion gap: 8 (ref 5–15)
BUN: 11 mg/dL (ref 8–23)
CO2: 27 mmol/L (ref 22–32)
Calcium: 9.3 mg/dL (ref 8.9–10.3)
Chloride: 101 mmol/L (ref 98–111)
Creatinine: 1.16 mg/dL (ref 0.61–1.24)
GFR, Estimated: >60 mL/min (ref >60)
Glucose, Bld: 120 mg/dL — ABNORMAL HIGH (ref 70–99)
Potassium: 3.9 mmol/L (ref 3.5–5.1)
Sodium: 136 mmol/L (ref 135–145)
Total Bilirubin: 0.5 mg/dL (ref 0.3–1.2)
Total Protein: 8 g/dL (ref 6.5–8.1)

## 2020-08-19 LAB — CBC WITH DIFFERENTIAL (CANCER CENTER ONLY)
Abs Immature Granulocytes: 0.02 10*3/uL (ref 0.00–0.07)
Basophils Absolute: 0 10*3/uL (ref 0.0–0.1)
Basophils Relative: 0 %
Eosinophils Absolute: 0.3 10*3/uL (ref 0.0–0.5)
Eosinophils Relative: 4 %
HCT: 32 % — ABNORMAL LOW (ref 39.0–52.0)
Hemoglobin: 10.6 g/dL — ABNORMAL LOW (ref 13.0–17.0)
Immature Granulocytes: 0 %
Lymphocytes Relative: 17 %
Lymphs Abs: 1.1 10*3/uL (ref 0.7–4.0)
MCH: 30.8 pg (ref 26.0–34.0)
MCHC: 33.1 g/dL (ref 30.0–36.0)
MCV: 93 fL (ref 80.0–100.0)
Monocytes Absolute: 0.8 10*3/uL (ref 0.1–1.0)
Monocytes Relative: 13 %
Neutro Abs: 4.1 10*3/uL (ref 1.7–7.7)
Neutrophils Relative %: 66 %
Platelet Count: 313 10*3/uL (ref 150–400)
RBC: 3.44 MIL/uL — ABNORMAL LOW (ref 4.22–5.81)
RDW: 12.1 % (ref 11.5–15.5)
WBC Count: 6.3 10*3/uL (ref 4.0–10.5)
nRBC: 0 % (ref 0.0–0.2)

## 2020-08-19 LAB — TSH: TSH: 1.299 u[IU]/mL (ref 0.320–4.118)

## 2020-08-19 MED ORDER — SODIUM CHLORIDE 0.9 % IV SOLN
360.0000 mg | Freq: Once | INTRAVENOUS | Status: AC
  Administered 2020-08-19: 360 mg via INTRAVENOUS
  Filled 2020-08-19: qty 24

## 2020-08-19 MED ORDER — SODIUM CHLORIDE 0.9% FLUSH
10.0000 mL | INTRAVENOUS | Status: DC | PRN
  Administered 2020-08-19: 10 mL
  Filled 2020-08-19: qty 10

## 2020-08-19 MED ORDER — SODIUM CHLORIDE 0.9 % IV SOLN
Freq: Once | INTRAVENOUS | Status: AC
  Filled 2020-08-19: qty 250

## 2020-08-19 MED ORDER — DIPHENHYDRAMINE HCL 50 MG/ML IJ SOLN
INTRAMUSCULAR | Status: AC
  Filled 2020-08-19: qty 1

## 2020-08-19 MED ORDER — FAMOTIDINE 20 MG IN NS 100 ML IVPB
20.0000 mg | Freq: Once | INTRAVENOUS | Status: AC
  Administered 2020-08-19: 20 mg via INTRAVENOUS

## 2020-08-19 MED ORDER — SODIUM CHLORIDE 0.9 % IV SOLN
1.0000 mg/kg | Freq: Once | INTRAVENOUS | Status: AC
  Administered 2020-08-19: 80 mg via INTRAVENOUS
  Filled 2020-08-19: qty 16

## 2020-08-19 MED ORDER — FAMOTIDINE 20 MG IN NS 100 ML IVPB
INTRAVENOUS | Status: AC
  Filled 2020-08-19: qty 100

## 2020-08-19 MED ORDER — IOPAMIDOL (ISOVUE-300) INJECTION 61%
100.0000 mL | Freq: Once | INTRAVENOUS | Status: AC | PRN
  Administered 2020-08-19: 100 mL via INTRAVENOUS

## 2020-08-19 MED ORDER — DIPHENHYDRAMINE HCL 50 MG/ML IJ SOLN
25.0000 mg | Freq: Once | INTRAMUSCULAR | Status: AC
  Administered 2020-08-19: 25 mg via INTRAVENOUS

## 2020-08-19 MED ORDER — HEPARIN SOD (PORK) LOCK FLUSH 100 UNIT/ML IV SOLN
500.0000 [IU] | Freq: Once | INTRAVENOUS | Status: AC | PRN
  Administered 2020-08-19: 500 [IU]
  Filled 2020-08-19: qty 5

## 2020-08-19 NOTE — Patient Instructions (Signed)
[facility] ONCOLOGY  Discharge Instructions: Thank you for choosing [facility] to provide your oncology and hematology care.   If you have a lab appointment with the [facility], please go directly to the [facility] and check in at the registration area.   Wear comfortable clothing and clothing appropriate for easy access to any Portacath or PICC line.   We strive to give you quality time with your provider. You may need to reschedule your appointment if you arrive late (15 or more minutes).  Arriving late affects you and other patients whose appointments are after yours.  Also, if you miss three or more appointments without notifying the office, you may be dismissed from the clinic at the provider's discretion.      For prescription refill requests, have your pharmacy contact our office and allow 72 hours for refills to be completed.    Today you received the following chemotherapy and/or immunotherapy agents opdivo/ yervoy   To help prevent nausea and vomiting after your treatment, we encourage you to take your nausea medication as directed.  BELOW ARE SYMPTOMS THAT SHOULD BE REPORTED IMMEDIATELY: *FEVER GREATER THAN 100.4 F (38 C) OR HIGHER *CHILLS OR SWEATING *NAUSEA AND VOMITING THAT IS NOT CONTROLLED WITH YOUR NAUSEA MEDICATION *UNUSUAL SHORTNESS OF BREATH *UNUSUAL BRUISING OR BLEEDING *URINARY PROBLEMS (pain or burning when urinating, or frequent urination) *BOWEL PROBLEMS (unusual diarrhea, constipation, pain near the anus) TENDERNESS IN MOUTH AND THROAT WITH OR WITHOUT PRESENCE OF ULCERS (sore throat, sores in mouth, or a toothache) UNUSUAL RASH, SWELLING OR PAIN  UNUSUAL VAGINAL DISCHARGE OR ITCHING   Items with * indicate a potential emergency and should be followed up as soon as possible or go to the Emergency Department if any problems should occur.  Please show the CHEMOTHERAPY ALERT CARD or IMMUNOTHERAPY ALERT CARD at check-in to  the Emergency Department and triage nurse.  Should you have questions after your visit or need to cancel or reschedule your appointment, please contact [facility]  Dept: [phone]  and follow the prompts.  Office hours are 8:00 a.m. to 4:30 p.m. Monday - Friday. Please note that voicemails left after 4:00 p.m. may not be returned until the following business day.  We are closed weekends and major holidays. You have access to a nurse at all times for urgent questions. Please call the main number to the clinic Dept: [phone] and follow the prompts.   For any non-urgent questions, you may also contact your provider using MyChart. We now offer e-Visits for anyone 110 and older to request care online for non-urgent symptoms. For details visit [email or web address].   Also download the MyChart app! Go to the app store, search "MyChart", open the app, select [facility], and log in with your MyChart username and password.  Due to Covid, a mask is required upon entering the hospital/clinic. If you do not have a mask, one will be given to you upon arrival. For doctor visits, patients may have 1 support person aged 63 or older with them. For treatment visits, patients cannot have anyone with them due to current Covid guidelines and our immunocompromised population.

## 2020-08-19 NOTE — Progress Notes (Signed)
[place] [phone]   [phone]  OFFICE PROGRESS NOTE  Shaun Kennedy, Shaun [place] [address]  DIAGNOSIS: stage IV (T3, N2, M1 a) non-small cell Kennedy cancer, squamous cell carcinoma presented with obstructive left lower lobe Kennedy mass in addition to mediastinal lymphadenopathy as well as bilateral pulmonary nodules diagnosed in July 2021.   PDL1: 0%   PRIOR THERAPY: None   CURRENT THERAPY: 1) Palliative radiotherapy to the obstructive left lower lobe Kennedy mass under the care of Dr. Lisbeth Renshaw.  2)  systemic chemotherapy 2 cycles of chemotherapy with carboplatin for an AUC of 5 and paclitaxel 175 mg/m2 in addition to immunotherapy with nivolumab 360 mg every 3 weeks and ipilimumab 1 mg/kg IV every 6 weeks followed by maintenance nivolumab and ipilimumab.  He started the first treatment on 09/04/2019.  He is status post 8 cycles of the treatment.  INTERVAL HISTORY: Shaun Kennedy 80 y.o. male returns to the clinic today for follow-up visit.  The patient has no complaints today except for mild fatigue.  He denied having any chest pain, shortness of breath, cough or hemoptysis.  He denied having any fever or chills.  He has no nausea, vomiting, diarrhea or constipation.  He has no headache or visual changes.  He has no weight loss or night sweats.  He has been tolerating his treatment with immunotherapy fairly well.  The patient had repeat CT scan of the chest, abdomen pelvis performed earlier today and he is here for evaluation before starting cycle #9.  MEDICAL HISTORY: Past Medical History:  Diagnosis Date   Allergic rhinitis    Asthma    Carotid stenosis    Colon cancer ([place]) 2003   Diabetes mellitus ([place])    Diverticulosis    Dyslipidemia    ED (erectile dysfunction)    GERD (gastroesophageal reflux disease)    H/O degenerative disc disease    Hemorrhoids    HTN (hypertension)    Hyperlipidemia    Kennedy cancer  (HCC)    LVH (left ventricular hypertrophy)    on EKG   Mass of lower lobe of left Kennedy    Mediastinal adenopathy    Smoker    former   Wears dentures    Wears dentures    Wears glasses    Wears glasses     ALLERGIES:  has No Known Allergies.  MEDICATIONS:  Current Outpatient Medications  Medication Sig Dispense Refill   aspirin EC 81 MG tablet Take 81 mg by mouth daily after breakfast.      Blood Glucose Monitoring Suppl (ONETOUCH VERIO) w/Device KIT USE TO CHECK SUGAR DAILY 1 kit 0   dronabinol (MARINOL) 2.5 MG capsule Take 1 capsule (2.5 mg total) by mouth 2 (two) times daily before a meal. 60 capsule 1   ferrous sulfate 325 (65 FE) MG EC tablet Take 1 tablet (325 mg total) by mouth daily with breakfast. 30 tablet 12   Lancets (ONETOUCH DELICA PLUS [id number]) MISC PATIENT IS TO TEST TWO TIMES A DAY DX: E11.9 (ONETOUCH VERIO FLEX) 100 each 12   metFORMIN (GLUCOPHAGE-XR) 500 MG 24 hr tablet TAKE 1 TABLET EVERY DAY 90 tablet 1   Multiple Vitamin (MULTIVITAMIN WITH MINERALS) TABS tablet Take 1 tablet by mouth daily after breakfast.      solifenacin (VESICARE) 5 MG tablet TAKE 1 TABLET EVERY DAY 90 tablet 1   acetaminophen (TYLENOL) 500 MG tablet Take 1,000 mg by mouth  every 4 (four) hours as needed. (Patient not taking: No sig reported)     amLODipine (NORVASC) 5 MG tablet Take 1 tablet (5 mg total) by mouth daily. 90 tablet 3   ANORO ELLIPTA 62.5-25 MCG/INH AEPB Inhale 1 puff into the lungs daily. (Patient not taking: No sig reported)     HYDROcodone-acetaminophen (NORCO/VICODIN) 5-325 MG tablet Take 1 tablet by mouth every 4 (four) hours as needed. 10 tablet 0   senna-docusate (SENOKOT-S) 8.6-50 MG tablet Take 2 tablets by mouth 2 (two) times daily. (Patient not taking: No sig reported) 30 tablet 0   simvastatin (ZOCOR) 40 MG tablet Take 40 mg by mouth daily.     No current facility-administered medications for this visit.   Facility-Administered Medications Ordered in Other  Visits  Medication Dose Route Frequency Provider Last Rate Last Admin   sodium chloride flush (NS) 0.9 % injection 10 mL  10 mL Intracatheter PRN Curt Bears, MD   10 mL at 09/04/19 1556    SURGICAL HISTORY:  Past Surgical History:  Procedure Laterality Date   BRONCHIAL BIOPSY  08/20/2019   Procedure: BRONCHIAL BIOPSIES;  Surgeon: Collene Gobble, MD;  Location: [facility] ENDOSCOPY;  Service: Pulmonary;;   BRONCHIAL BRUSHINGS  08/20/2019   Procedure: BRONCHIAL BRUSHINGS;  Surgeon: Collene Gobble, MD;  Location: [facility] ENDOSCOPY;  Service: Pulmonary;;   BRONCHIAL NEEDLE ASPIRATION BIOPSY  08/20/2019   Procedure: BRONCHIAL NEEDLE ASPIRATION BIOPSIES;  Surgeon: Collene Gobble, MD;  Location: [place];  Service: Pulmonary;;   CARPAL TUNNEL RELEASE Right 01/09/2019   Procedure: CARPAL TUNNEL RELEASE;  Surgeon: Leandrew Koyanagi, MD;  Location: [place];  Service: Orthopedics;  Laterality: Right;   CATARACT EXTRACTION Right 2018   COLONOSCOPY  2007   Dr. Benson Norway   DIRECT LARYNGOSCOPY N/A 04/10/2020   Procedure: DIRECT LARYNGOSCOPY;  Surgeon: Melida Quitter, MD;  Location: [place];  Service: ENT;  Laterality: N/A;   I & D EXTREMITY Right 04/02/2016   Procedure: IRRIGATION AND DEBRIDEMENT GREAT TOE;  Surgeon: Leandrew Koyanagi, MD;  Location: [place];  Service: Orthopedics;  Laterality: Right;   IR IMAGING GUIDED PORT INSERTION  08/30/2019   MULTIPLE TOOTH EXTRACTIONS     RADIOACTIVE SEED IMPLANT  2003   TRACHEOSTOMY TUBE PLACEMENT N/A 04/10/2020   Procedure: TRACHEOSTOMY;  Surgeon: Melida Quitter, MD;  Location: [place];  Service: ENT;  Laterality: N/A;   ULNAR TUNNEL RELEASE Right 01/09/2019   Procedure: RIGHT CUBITAL TUNNEL RELEASE AND CARPAL TUNNEL RELEASE;  Surgeon: Leandrew Koyanagi, MD;  Location: [place];  Service: Orthopedics;  Laterality: Right;   UPPER GASTROINTESTINAL ENDOSCOPY     VIDEO BRONCHOSCOPY N/A 12/18/2019   Procedure: VIDEO BRONCHOSCOPY WITHOUT FLUORO;  Surgeon: Collene Gobble, MD;  Location: [facility] ENDOSCOPY;  Service: Cardiopulmonary;  Laterality: N/A;   VIDEO BRONCHOSCOPY WITH ENDOBRONCHIAL ULTRASOUND N/A 08/20/2019   Procedure: VIDEO BRONCHOSCOPY WITH ENDOBRONCHIAL ULTRASOUND;  Surgeon: Collene Gobble, MD;  Location[facility] ENDOSCOPY;  Service: Pulmonary;  Laterality: N/A;    REVIEW OF SYSTEMS:  Constitutional: positive for fatigue Eyes: negative Ears, nose, mouth, throat, and face: negative Respiratory: negative Cardiovascular: negative Gastrointestinal: negative Genitourinary:negative Integument/breast: negative Hematologic/lymphatic: negative Musculoskeletal:negative Neurological: negative Behavioral/Psych: negative Endocrine: negative Allergic/Immunologic: negative   PHYSICAL EXAMINATION: General appearance: alert, cooperative, and no distress Head: Normocephalic, without obvious abnormality, atraumatic Neck: no adenopathy, no JVD, supple, symmetrical, trachea midline, and thyroid not enlarged, symmetric, no tenderness/mass/nodules Lymph nodes: Cervical, supraclavicular, and axillary nodes normal. Resp:  clear to auscultation bilaterally Back: symmetric, no curvature. ROM normal. No CVA tenderness. Cardio: regular rate and rhythm, S1, S2 normal, no murmur, click, rub or gallop GI: soft, non-tender; bowel sounds normal; no masses,  no organomegaly Extremities: extremities normal, atraumatic, no cyanosis or edema Neurologic: Alert and oriented X 3, normal strength and tone. Normal symmetric reflexes. Normal coordination and gait  ECOG PERFORMANCE STATUS: 1 - Symptomatic but completely ambulatory  Blood pressure 131/66, pulse 96, temperature 99.9 F (37.7 C), temperature source Oral, resp. rate 19, height '5\' 7"'  (1.702 m), weight 176 lb 11.2 oz (80.2 kg), SpO2 98 %.  LABORATORY DATA: Lab Results  Component Value Date   WBC 6.3 08/19/2020   HGB 10.6 (L) 08/19/2020   HCT 32.0 (L) 08/19/2020   MCV 93.0 08/19/2020   PLT 313 08/19/2020       Chemistry      Component Value Date/Time   NA 136 08/19/2020 1014   NA 132 (L) 08/14/2019 0918   K 3.9 08/19/2020 1014   CL 101 08/19/2020 1014   CO2 27 08/19/2020 1014   BUN 11 08/19/2020 1014   BUN 16 08/14/2019 0918   CREATININE 1.16 08/19/2020 1014   CREATININE 1.25 (H) 08/08/2016 0912      Component Value Date/Time   CALCIUM 9.3 08/19/2020 1014   ALKPHOS 57 08/19/2020 1014   AST 13 (L) 08/19/2020 1014   ALT 9 08/19/2020 1014   BILITOT 0.5 08/19/2020 1014       RADIOGRAPHIC STUDIES: No results found.  ASSESSMENT AND PLAN: This is a very pleasant 80 years old [name] with stage IV non-small cell Kennedy cancer, squamous cell carcinoma with negative PD-L1 expression. The patient is currently undergoing systemic chemotherapy with carboplatin and paclitaxel for 2 cycles in addition to immunotherapy with ipilimumab 1 mg/KG every 6 weeks and nivolumab 360 mg IV every 3 weeks status post 8 cycles. The patient has been tolerating this treatment well with no concerning adverse effects. He had repeat CT scan of the chest, abdomen pelvis performed earlier today.  I personally and independently reviewed the scan images and I did not see any clear evidence for progression but the final report is still pending. I recommended for the patient to proceed with day 1 of cycle #9 today as planned. If the pending scan report showed any concerning findings, I will call the patient with the results and recommendation. He will come back for follow-up visit in 3 weeks for evaluation before starting day #22 of cycle #9. The patient was advised to call immediately if he has any other concerning symptoms in the interval. The patient voices understanding of current disease status and treatment options and is in agreement with the current care plan.  All questions were answered. The patient knows to call the clinic with any problems, questions or concerns. We can certainly see the patient much  sooner if necessary.  Disclaimer: This note was dictated with voice recognition software. Similar sounding words can inadvertently be transcribed and may not be corrected upon review.

## 2020-08-25 ENCOUNTER — Telehealth: Payer: Self-pay

## 2020-08-25 ENCOUNTER — Other Ambulatory Visit: Payer: Self-pay

## 2020-08-25 NOTE — Telephone Encounter (Signed)
Called in pt new meter and supplies to [facility] per fax that cam e in 08/25/20. [place]

## 2020-08-25 NOTE — Telephone Encounter (Signed)
Received fax from [facility] for a refill on BD single use swab and true metrix level 1 ctrl soln last apt was 07/30/20 and next apt is 10/28/20.

## 2020-08-25 NOTE — Telephone Encounter (Signed)
Call in to [facility]. [place]

## 2020-08-31 ENCOUNTER — Telehealth: Payer: Self-pay | Admitting: Family Medicine

## 2020-08-31 MED ORDER — TRUEDRAW LANCING DEVICE MISC: 0 refills | Status: DC

## 2020-08-31 NOTE — Telephone Encounter (Signed)
Sent in

## 2020-08-31 NOTE — Telephone Encounter (Signed)
[facility] mail order sent refill refill request for  Truedraw lancing device kit Please send to the  [facility] mail order

## 2020-09-01 ENCOUNTER — Emergency Department (HOSPITAL_COMMUNITY)
Admission: EM | Admit: 2020-09-01 | Discharge: 2020-09-02 | Disposition: A | Discharge status: Home / Self Care | Payer: Medicare HMO | Attending: Emergency Medicine | Admitting: Emergency Medicine

## 2020-09-01 ENCOUNTER — Emergency Department (HOSPITAL_COMMUNITY): Payer: Medicare HMO

## 2020-09-01 ENCOUNTER — Other Ambulatory Visit: Payer: Self-pay

## 2020-09-01 DIAGNOSIS — Z79899 Other long term (current) drug therapy: Secondary | ICD-10-CM | POA: Insufficient documentation

## 2020-09-01 DIAGNOSIS — E119 Type 2 diabetes mellitus without complications: Secondary | ICD-10-CM | POA: Diagnosis not present

## 2020-09-01 DIAGNOSIS — I10 Essential (primary) hypertension: Secondary | ICD-10-CM | POA: Diagnosis not present

## 2020-09-01 DIAGNOSIS — R059 Cough, unspecified: Secondary | ICD-10-CM

## 2020-09-01 DIAGNOSIS — Z85118 Personal history of other malignant neoplasm of bronchus and lung: Secondary | ICD-10-CM | POA: Insufficient documentation

## 2020-09-01 DIAGNOSIS — J9509 Other tracheostomy complication: Secondary | ICD-10-CM | POA: Diagnosis not present

## 2020-09-01 DIAGNOSIS — Z7984 Long term (current) use of oral hypoglycemic drugs: Secondary | ICD-10-CM | POA: Insufficient documentation

## 2020-09-01 DIAGNOSIS — J9501 Hemorrhage from tracheostomy stoma: Secondary | ICD-10-CM | POA: Insufficient documentation

## 2020-09-01 DIAGNOSIS — Z7982 Long term (current) use of aspirin: Secondary | ICD-10-CM | POA: Diagnosis not present

## 2020-09-01 DIAGNOSIS — Z85038 Personal history of other malignant neoplasm of large intestine: Secondary | ICD-10-CM | POA: Diagnosis not present

## 2020-09-01 DIAGNOSIS — J45909 Unspecified asthma, uncomplicated: Secondary | ICD-10-CM | POA: Diagnosis not present

## 2020-09-01 DIAGNOSIS — Z87891 Personal history of nicotine dependence: Secondary | ICD-10-CM | POA: Diagnosis not present

## 2020-09-01 DIAGNOSIS — Z430 Encounter for attention to tracheostomy: Secondary | ICD-10-CM

## 2020-09-01 NOTE — ED Triage Notes (Signed)
Pt reports cough x 5 weeks and today noticed blood-tinged sputum after his son replaced the trach this morning. Denies shob or chest pain.

## 2020-09-02 NOTE — Discharge Instructions (Addendum)
You were seen today for blood at your tracheostomy site.  There is no active bleeding.  Your chest x-ray is clear.  Follow-up with Dr. Redmond Baseman and your primary physician.

## 2020-09-02 NOTE — ED Provider Notes (Signed)
[facility] EMERGENCY DEPARTMENT Provider Note   CSN: [id number] Arrival date & time: 09/01/20  1518     History Chief Complaint  Patient presents with   Cough    Shaun Kennedy is a 80 y.o. male.  HPI     This is an 80 year old male with a history of diabetes, hypertension, hyperlipidemia, permanent tracheostomy who presents with concerns for blood in his tracheostomy site.  Per the patient and his wife, their son change the tracheostomy tube yesterday morning.  They noted blood at the inner cannula.  Wife describes it as being "full of blood."  This only happened once.  There is no continual bleeding.  Patient states he has not felt short of breath.  Reports chronic nonproductive cough.  Wife states "I think he has a cold."  He is not on any blood thinners.  Past Medical History:  Diagnosis Date   Allergic rhinitis    Asthma    Carotid stenosis    Colon cancer ([place]) 2003   Diabetes mellitus ([place])    Diverticulosis    Dyslipidemia    ED (erectile dysfunction)    GERD (gastroesophageal reflux disease)    H/O degenerative disc disease    Hemorrhoids    HTN (hypertension)    Hyperlipidemia    Lung cancer (HCC)    LVH (left ventricular hypertrophy)    on EKG   Mass of lower lobe of left lung    Mediastinal adenopathy    Smoker    former   Wears dentures    Wears dentures    Wears glasses    Wears glasses     Patient Active Problem List   Diagnosis Date Noted   Trachea, stenosis    Tracheostomy status ([place])    Left lower lobe pneumonia 04/20/2020   Pressure injury of skin 04/20/2020   Pleuritic chest pain 03/17/2020   Chemotherapy-induced neuropathy ([place]) 02/12/2020   Vocal cord dysfunction 02/12/2020   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   Stridor 12/18/2019   Abnormal findings on diagnostic imaging of lung 11/06/2019   Antineoplastic chemotherapy induced anemia 10/16/2019   Shortness of breath 10/15/2019   Odynophagia 09/25/2019   Port-A-Cath  in place 09/17/2019   Decreased appetite 09/03/2019   Malignant neoplasm of lower lobe of left lung ([place]) 08/28/2019   Encounter for antineoplastic chemotherapy 08/24/2019   Encounter for antineoplastic immunotherapy 08/24/2019   Goals of care, counseling/discussion 08/24/2019   Atherosclerosis of aorta ([place]) 04/09/2019   Carpal tunnel syndrome, left upper limb 02/19/2019   History of carpal tunnel surgery of right wrist 12/21/2018   OAB (overactive bladder) 10/11/2017   Diabetic nephropathy associated with type 2 diabetes mellitus ([place]) 10/11/2017   Onychomycosis of multiple toenails with type 2 diabetes mellitus ([place]) 07/22/2015   Former smoker, stopped smoking in distant past 07/22/2015   Diabetes mellitus type II, non insulin dependent ([place]) 06/13/2011   ED (erectile dysfunction) 06/13/2011   History of prostate cancer 06/13/2011   Hypertension associated with diabetes ([place]) 06/13/2011   Hyperlipidemia associated with type 2 diabetes mellitus ([place]) 06/13/2011   Allergic rhinitis, mild 06/13/2011   History of asthma 06/13/2011   Carotid stenosis 06/13/2011    Past Surgical History:  Procedure Laterality Date   BRONCHIAL BIOPSY  08/20/2019   Procedure: BRONCHIAL BIOPSIES;  Surgeon: Collene Gobble, MD;  Location: [facility] ENDOSCOPY;  Service: Pulmonary;;   BRONCHIAL BRUSHINGS  08/20/2019   Procedure: BRONCHIAL BRUSHINGS;  Surgeon: Collene Gobble, MD;  Location: [facility]  ENDOSCOPY;  Service: Pulmonary;;   BRONCHIAL NEEDLE ASPIRATION BIOPSY  08/20/2019   Procedure: BRONCHIAL NEEDLE ASPIRATION BIOPSIES;  Surgeon: Collene Gobble, MD;  Location: [facility] ENDOSCOPY;  Service: Pulmonary;;   CARPAL TUNNEL RELEASE Right 01/09/2019   Procedure: CARPAL TUNNEL RELEASE;  Surgeon: Leandrew Koyanagi, MD;  Location: [place];  Service: Orthopedics;  Laterality: Right;   CATARACT EXTRACTION Right 2018   COLONOSCOPY  2007   Dr. Benson Norway   DIRECT LARYNGOSCOPY N/A 04/10/2020   Procedure: DIRECT LARYNGOSCOPY;   Surgeon: Melida Quitter, MD;  Location: [place];  Service: ENT;  Laterality: N/A;   I & D EXTREMITY Right 04/02/2016   Procedure: IRRIGATION AND DEBRIDEMENT GREAT TOE;  Surgeon: Leandrew Koyanagi, MD;  Location: [place];  Service: Orthopedics;  Laterality: Right;   IR IMAGING GUIDED PORT INSERTION  08/30/2019   MULTIPLE TOOTH EXTRACTIONS     RADIOACTIVE SEED IMPLANT  2003   TRACHEOSTOMY TUBE PLACEMENT N/A 04/10/2020   Procedure: TRACHEOSTOMY;  Surgeon: Melida Quitter, MD;  Location: [place];  Service: ENT;  Laterality: N/A;   ULNAR TUNNEL RELEASE Right 01/09/2019   Procedure: RIGHT CUBITAL TUNNEL RELEASE AND CARPAL TUNNEL RELEASE;  Surgeon: Leandrew Koyanagi, MD;  Location: [place];  Service: Orthopedics;  Laterality: Right;   UPPER GASTROINTESTINAL ENDOSCOPY     VIDEO BRONCHOSCOPY N/A 12/18/2019   Procedure: VIDEO BRONCHOSCOPY WITHOUT FLUORO;  Surgeon: Collene Gobble, MD;  Location: [facility] ENDOSCOPY;  Service: Cardiopulmonary;  Laterality: N/A;   VIDEO BRONCHOSCOPY WITH ENDOBRONCHIAL ULTRASOUND N/A 08/20/2019   Procedure: VIDEO BRONCHOSCOPY WITH ENDOBRONCHIAL ULTRASOUND;  Surgeon: Collene Gobble, MD;  Location: [facility] ENDOSCOPY;  Service: Pulmonary;  Laterality: N/A;       Family History  Problem Relation Age of Onset   Heart failure Mother    Diabetes Mother    Stroke Father    Diabetes Father    Diabetes Sister    Diabetes Sister    Lung cancer Sister 7   Colon cancer Neg Hx    Esophageal cancer Neg Hx    Rectal cancer Neg Hx    Colon polyps Neg Hx    Stomach cancer Neg Hx     Social History   Tobacco Use   Smoking status: Former    Packs/day: 1.00    Years: 32.00    Pack years: 32.00    Types: Cigarettes    Start date: 02/08/1955    Quit date: 10/09/1987    Years since quitting: 32.9   Smokeless tobacco: Never  Vaping Use   Vaping Use: Never used  Substance Use Topics   Alcohol use: No   Drug use: No    Home Medications Prior to Admission medications   Medication Sig  Start Date End Date Taking? Authorizing Provider  acetaminophen (TYLENOL) 500 MG tablet Take 1,000 mg by mouth every 4 (four) hours as needed. Patient not taking: No sig reported    [provider]  amLODipine (NORVASC) 5 MG tablet Take 1 tablet (5 mg total) by mouth daily. 04/07/20   [name], MD  ANORO ELLIPTA 62.5-25 MCG/INH AEPB Inhale 1 puff into the lungs daily. Patient not taking: No sig reported 01/29/20   [provider]  aspirin EC 81 MG tablet Take 81 mg by mouth daily after breakfast.     [provider]  Blood Glucose Monitoring Suppl (ONETOUCH VERIO) w/Device KIT USE TO CHECK SUGAR DAILY 02/21/17   [name], MD  dronabinol (  MARINOL) 2.5 MG capsule Take 1 capsule (2.5 mg total) by mouth 2 (two) times daily before a meal. 06/29/20   [name], MD  ferrous sulfate 325 (65 FE) MG EC tablet Take 1 tablet (325 mg total) by mouth daily with breakfast. 04/07/20   [name], MD  HYDROcodone-acetaminophen (NORCO/VICODIN) 5-325 MG tablet Take 1 tablet by mouth every 4 (four) hours as needed. 05/27/20   Isla Pence, MD  Lancet Devices (TRUEDRAW LANCING DEVICE) MISC Check sugars daily 08/31/20   [name], MD  Lancets (ONETOUCH DELICA PLUS [id number]) [place] PATIENT IS TO TEST TWO TIMES A DAY DX: E11.9 (ONETOUCH VERIO FLEX) 04/23/19   [name], MD  metFORMIN (GLUCOPHAGE-XR) 500 MG 24 hr tablet TAKE 1 TABLET EVERY DAY 08/17/20   [name], MD  Multiple Vitamin (MULTIVITAMIN WITH MINERALS) TABS tablet Take 1 tablet by mouth daily after breakfast.     [provider]  senna-docusate (SENOKOT-S) 8.6-50 MG tablet Take 2 tablets by mouth 2 (two) times daily. Patient not taking: No sig reported 04/23/20   Nita Sells, MD  simvastatin (ZOCOR) 40 MG tablet Take 40 mg by mouth daily. 06/27/20   [provider]  solifenacin (VESICARE) 5 MG tablet TAKE 1 TABLET EVERY DAY 08/17/20   [name], MD    Allergies     Patient has no known allergies.  Review of Systems   Review of Systems  Constitutional:  Negative for fever.  Respiratory:  Positive for cough. Negative for shortness of breath.   Cardiovascular:  Negative for chest pain.  Gastrointestinal:  Negative for abdominal pain, nausea and vomiting.  All other systems reviewed and are negative.  Physical Exam Updated Vital Signs BP (!) 143/53   Pulse 62   Temp 98.3 F (36.8 C) (Oral)   Resp (!) 22   SpO2 99%   Physical Exam Vitals and nursing note reviewed.  Constitutional:      Appearance: He is well-developed.     Comments: Chronically ill-appearing but nontoxic, no acute distress  HENT:     Head: Normocephalic and atraumatic.     Nose: Nose normal.     Mouth/Throat:     Mouth: Mucous membranes are moist.  Eyes:     Pupils: Pupils are equal, round, and reactive to light.  Neck:     Comments: Tracheostomy in place, and cannula removed, no mucous plugging or blood noted, no blood noted about the tracheostomy site Cardiovascular:     Rate and Rhythm: Normal rate and regular rhythm.     Heart sounds: Normal heart sounds. No murmur heard. Pulmonary:     Effort: Pulmonary effort is normal. No respiratory distress.     Breath sounds: Normal breath sounds. No wheezing.  Abdominal:     Palpations: Abdomen is soft.     Tenderness: There is no abdominal tenderness.  Musculoskeletal:        General: No swelling.     Cervical back: Neck supple.  Lymphadenopathy:     Cervical: No cervical adenopathy.  Skin:    General: Skin is warm and dry.  Neurological:     Mental Status: He is alert and oriented to person, place, and time.  Psychiatric:        Mood and Affect: Mood normal.    ED Results / Procedures / Treatments   Labs (all labs ordered are listed, but only abnormal results are displayed) Labs Reviewed - No data to display  EKG None  Radiology DG Chest 2 View  Result Date: 09/01/2020 CLINICAL DATA:  Cough. EXAM:  CHEST - 2 VIEW COMPARISON:  May 27, 2020.  August 19, 2020. FINDINGS: The heart size and mediastinal contours are within normal limits. Stable position of right internal jugular Port-A-Cath. Tracheostomy tube is unchanged. Right lung is clear. Stable left perihilar opacity is noted most likely representing post treatment change as noted on prior CT scan. The visualized skeletal structures are unremarkable. IMPRESSION: Stable chronic findings as described above. No significant change compared to prior exam. Electronically Signed   By: Marijo Conception M.D.   On: 09/01/2020 17:49    Procedures Procedures   Medications Ordered in ED Medications - No data to display  ED Course  I have reviewed the triage vital signs and the nursing notes.  Pertinent labs & imaging results that were available during my care of the patient were reviewed by me and considered in my medical decision making (see chart for details).    MDM Rules/Calculators/A&P                           Patient presents with concerns for bleeding in his tracheostomy site.  He is overall nontoxic and vital signs are reassuring.  He has no active bleeding on exam.  He reports 1 isolated episode.  He reports chronic cough.  Chest x-ray reviewed from triage.  No evidence of pneumonia or other abnormality.  He is stable from prior chest x-ray.  Patient and his wife reassured.  Given that the blood was after trach change, suspect it may have been traumatic.  No evidence of ongoing bleeding.  After history, exam, and medical workup I feel the patient has been appropriately medically screened and is safe for discharge home. Pertinent diagnoses were discussed with the patient. Patient was given return precautions.  Final Clinical Impression(s) / ED Diagnoses Final diagnoses:  Tracheostomy care ([place])  Cough    Rx / DC Orders ED Discharge Orders     None        Joclynn Lumb, Barbette Hair, MD 09/02/20 [id number]

## 2020-09-04 NOTE — Progress Notes (Signed)
[facility] OFFICE PROGRESS NOTE  [name], [place] [address]  DIAGNOSIS: Stage IV (T3, N2, M1 a) non-small cell lung cancer, squamous cell carcinoma presented with obstructive left lower lobe lung mass in addition to mediastinal lymphadenopathy as well as bilateral pulmonary nodules diagnosed in July 2021.   PDL1: 0%  PRIOR THERAPY:  Palliative radiotherapy to the obstructive left lower lobe lung mass under the care of Dr. Lisbeth Renshaw. Completed on 09/20/19  CURRENT THERAPY: Systemic chemotherapy 2 cycles of chemotherapy with carboplatin for an AUC of 5 and paclitaxel 175 mg/m2 in addition to immunotherapy with nivolumab 360 mg every 3 weeks and ipilimumab 1 mg/kg IV every 6 weeks followed by maintenance nivolumab and ipilimumab.  He started the first treatment on 09/04/2019.  He is status post Day 1 cycle #9 of the treatment.  INTERVAL HISTORY: Shaun Kennedy 80 y.o. male returns to the clinic today for a follow up visit. The patient is feeling well today without any concerning complaints. He follows closely with ENT for his tracheostomy. He presented to the ER for some challenges with his tracheostomy on 09/01/20 in which it had blood in the inner cannula after being changed. He also was reporting a cough and they also performed an X-ray while there which did not show any acute abnormalities/infections. The patient continues to tolerate treatment with immunotherapy well without any adverse side effects. Denies any fever, chills, night sweats, or weight loss. Denies any chest pain or hemoptysis. He reports a cough when he feels like mucus is stuck in his tracheostomy tube. He denies changes with his shortness of breath. Denies any nausea, vomiting, constipation, or diarrhea. Denies any headache or visual changes. Denies any rashes or skin changes. The patient is here today for evaluation prior to starting cycle # 9 day 22.    MEDICAL HISTORY: Past  Medical History:  Diagnosis Date   Allergic rhinitis    Asthma    Carotid stenosis    Colon cancer ([facility]) 2003   Diabetes mellitus ([facility])    Diverticulosis    Dyslipidemia    ED (erectile dysfunction)    GERD (gastroesophageal reflux disease)    H/O degenerative disc disease    Hemorrhoids    HTN (hypertension)    Hyperlipidemia    Lung cancer ([facility])    LVH (left ventricular hypertrophy)    on EKG   Mass of lower lobe of left lung    Mediastinal adenopathy    Smoker    former   Wears dentures    Wears dentures    Wears glasses    Wears glasses     ALLERGIES:  has No Known Allergies.  MEDICATIONS:  Current Outpatient Medications  Medication Sig Dispense Refill   acetaminophen (TYLENOL) 500 MG tablet Take 1,000 mg by mouth every 4 (four) hours as needed. (Patient not taking: No sig reported)     amLODipine (NORVASC) 5 MG tablet Take 1 tablet (5 mg total) by mouth daily. 90 tablet 3   ANORO ELLIPTA 62.5-25 MCG/INH AEPB Inhale 1 puff into the lungs daily. (Patient not taking: No sig reported)     aspirin EC 81 MG tablet Take 81 mg by mouth daily after breakfast.      Blood Glucose Monitoring Suppl (ONETOUCH VERIO) w/Device KIT USE TO CHECK SUGAR DAILY 1 kit 0   dronabinol (MARINOL) 2.5 MG capsule Take 1 capsule (2.5 mg total) by mouth 2 (two) times daily before a meal.  60 capsule 1   ferrous sulfate 325 (65 FE) MG EC tablet Take 1 tablet (325 mg total) by mouth daily with breakfast. 30 tablet 12   HYDROcodone-acetaminophen (NORCO/VICODIN) 5-325 MG tablet Take 1 tablet by mouth every 4 (four) hours as needed. 10 tablet 0   Lancet Devices (TRUEDRAW LANCING DEVICE) MISC Check sugars daily 90 each 0   Lancets (ONETOUCH DELICA PLUS TLXBWI20B) MISC PATIENT IS TO TEST TWO TIMES A DAY DX: E11.9 (ONETOUCH VERIO FLEX) 100 each 12   metFORMIN (GLUCOPHAGE-XR) 500 MG 24 hr tablet TAKE 1 TABLET EVERY DAY 90 tablet 1   Multiple Vitamin (MULTIVITAMIN WITH MINERALS) TABS tablet Take 1 tablet by  mouth daily after breakfast.      senna-docusate (SENOKOT-S) 8.6-50 MG tablet Take 2 tablets by mouth 2 (two) times daily. (Patient not taking: No sig reported) 30 tablet 0   simvastatin (ZOCOR) 40 MG tablet Take 40 mg by mouth daily.     solifenacin (VESICARE) 5 MG tablet TAKE 1 TABLET EVERY DAY 90 tablet 1   No current facility-administered medications for this visit.   Facility-Administered Medications Ordered in Other Visits  Medication Dose Route Frequency Provider Last Rate Last Admin   0.9 %  sodium chloride infusion   Intravenous Once Curt Bears, MD       heparin lock flush 100 unit/mL  500 Units Intracatheter Once PRN Curt Bears, MD       nivolumab (OPDIVO) 360 mg in sodium chloride 0.9 % 100 mL chemo infusion  360 mg Intravenous Once Curt Bears, MD       sodium chloride flush (NS) 0.9 % injection 10 mL  10 mL Intracatheter PRN Curt Bears, MD   10 mL at 09/04/19 1556   sodium chloride flush (NS) 0.9 % injection 10 mL  10 mL Intracatheter PRN Curt Bears, MD        SURGICAL HISTORY:  Past Surgical History:  Procedure Laterality Date   BRONCHIAL BIOPSY  08/20/2019   Procedure: BRONCHIAL BIOPSIES;  Surgeon: Collene Gobble, MD;  Location: [facility];  Service: Pulmonary;;   BRONCHIAL BRUSHINGS  08/20/2019   Procedure: BRONCHIAL BRUSHINGS;  Surgeon: Collene Gobble, MD;  Location: [facility];  Service: Pulmonary;;   BRONCHIAL NEEDLE ASPIRATION BIOPSY  08/20/2019   Procedure: BRONCHIAL NEEDLE ASPIRATION BIOPSIES;  Surgeon: Collene Gobble, MD;  Location: [facility];  Service: Pulmonary;;   CARPAL TUNNEL RELEASE Right 01/09/2019   Procedure: CARPAL TUNNEL RELEASE;  Surgeon: Leandrew Koyanagi, MD;  Location: [facility];  Service: Orthopedics;  Laterality: Right;   CATARACT EXTRACTION Right 2018   COLONOSCOPY  2007   Dr. Benson Norway   DIRECT LARYNGOSCOPY N/A 04/10/2020   Procedure: DIRECT LARYNGOSCOPY;  Surgeon: Melida Quitter, MD;  Location: [facility];   Service: ENT;  Laterality: N/A;   I & D EXTREMITY Right 04/02/2016   Procedure: IRRIGATION AND DEBRIDEMENT GREAT TOE;  Surgeon: Leandrew Koyanagi, MD;  Location: [facility];  Service: Orthopedics;  Laterality: Right;   IR IMAGING GUIDED PORT INSERTION  08/30/2019   MULTIPLE TOOTH EXTRACTIONS     RADIOACTIVE SEED IMPLANT  2003   TRACHEOSTOMY TUBE PLACEMENT N/A 04/10/2020   Procedure: TRACHEOSTOMY;  Surgeon: Melida Quitter, MD;  Location: [facility];  Service: ENT;  Laterality: N/A;   ULNAR TUNNEL RELEASE Right 01/09/2019   Procedure: RIGHT CUBITAL TUNNEL RELEASE AND CARPAL TUNNEL RELEASE;  Surgeon: Leandrew Koyanagi, MD;  Location: [facility];  Service: Orthopedics;  Laterality: Right;  UPPER GASTROINTESTINAL ENDOSCOPY     VIDEO BRONCHOSCOPY N/A 12/18/2019   Procedure: VIDEO BRONCHOSCOPY WITHOUT FLUORO;  Surgeon: Collene Gobble, MD;  Location: [facility] ENDOSCOPY;  Service: Cardiopulmonary;  Laterality: N/A;   VIDEO BRONCHOSCOPY WITH ENDOBRONCHIAL ULTRASOUND N/A 08/20/2019   Procedure: VIDEO BRONCHOSCOPY WITH ENDOBRONCHIAL ULTRASOUND;  Surgeon: Collene Gobble, MD;  [facility];  Service: Pulmonary;  Laterality: N/A;    REVIEW OF SYSTEMS:   Review of Systems  Constitutional: Negative for appetite change, chills, fatigue, fever and unexpected weight change.  HENT:   Negative for mouth sores, nosebleeds, sore throat and trouble swallowing.   Eyes: Negative for eye problems and icterus.  Respiratory: Positive for cough with tracheostomy tube. Negative for hemoptysis, shortness of breath and wheezing.   Cardiovascular: Negative for chest pain and leg swelling.  Gastrointestinal: Negative for abdominal pain, constipation, diarrhea, nausea and vomiting.  Genitourinary: Negative for bladder incontinence, difficulty urinating, dysuria, frequency and hematuria.   Musculoskeletal: Negative for back pain, gait problem, neck pain and neck stiffness.  Skin: Negative for itching and rash.  Neurological:  Negative for dizziness, extremity weakness, gait problem, headaches, light-headedness and seizures.  Hematological: Negative for adenopathy. Does not bruise/bleed easily.  Psychiatric/Behavioral: Negative for confusion, depression and sleep disturbance. The patient is not nervous/anxious.     PHYSICAL EXAMINATION:  Blood pressure (!) 110/48, pulse 71, temperature (!) 97.1 F (36.2 C), temperature source Tympanic, resp. rate 19, height '5\' 7"'  (1.702 m), weight 176 lb 12.8 oz (80.2 kg), SpO2 99 %.  ECOG PERFORMANCE STATUS: 1  Physical Exam  Constitutional: Oriented to person, place, and time and well-developed, well-nourished, and in no distress. No distress. HENT: Head: Normocephalic and atraumatic. Mouth/Throat: Oropharynx is clear and moist. No oropharyngeal exudate. Tracheostomy in place.  Eyes: Conjunctivae are normal. Right eye exhibits no discharge. Left eye exhibits no discharge. No scleral icterus. Neck: Normal range of motion. Neck supple. Cardiovascular: Normal rate, regular rhythm, normal heart sounds and intact distal pulses.   Pulmonary/Chest: Effort normal and breath sounds normal. No respiratory distress. No wheezes. No rales. Abdominal: Soft. Bowel sounds are normal. Exhibits no distension and no mass. There is no tenderness.  Musculoskeletal: Normal range of motion. Exhibits no edema.  Lymphadenopathy:    No cervical adenopathy.  Neurological: Alert and oriented to person, place, and time. Exhibits normal muscle tone. Gait normal. Coordination normal. Skin: Skin is warm and dry. No rash noted. Not diaphoretic. No erythema. No pallor.  Psychiatric: Mood, memory and judgment normal. Vitals reviewed.  LABORATORY DATA: Lab Results  Component Value Date   WBC 5.4 09/09/2020   HGB 10.1 (L) 09/09/2020   HCT 30.1 (L) 09/09/2020   MCV 92.3 09/09/2020   PLT 246 09/09/2020      Chemistry      Component Value Date/Time   NA 138 09/09/2020 1015   NA 132 (L) 08/14/2019  0918   K 3.9 09/09/2020 1015   CL 104 09/09/2020 1015   CO2 25 09/09/2020 1015   BUN 20 09/09/2020 1015   BUN 16 08/14/2019 0918   CREATININE 1.30 (H) 09/09/2020 1015   CREATININE 1.25 (H) 08/08/2016 0912      Component Value Date/Time   CALCIUM 9.1 09/09/2020 1015   ALKPHOS 50 09/09/2020 1015   AST 16 09/09/2020 1015   ALT 14 09/09/2020 1015   BILITOT 0.6 09/09/2020 1015       RADIOGRAPHIC STUDIES:  DG Chest 2 View  Result Date: 09/01/2020 CLINICAL DATA:  Cough. EXAM: CHEST -  2 VIEW COMPARISON:  May 27, 2020.  August 19, 2020. FINDINGS: The heart size and mediastinal contours are within normal limits. Stable position of right internal jugular Port-A-Cath. Tracheostomy tube is unchanged. Right lung is clear. Stable left perihilar opacity is noted most likely representing post treatment change as noted on prior CT scan. The visualized skeletal structures are unremarkable. IMPRESSION: Stable chronic findings as described above. No significant change compared to prior exam. Electronically Signed   By: Marijo Conception M.D.   On: 09/01/2020 17:49   CT CHEST ABDOMEN PELVIS W CONTRAST  Result Date: 08/19/2020 CLINICAL DATA:  Primary Cancer Type: Lung Imaging Indication: Assess response to therapy Interval therapy since last imaging? Yes Initial Cancer Diagnosis Date: 08/20/2019; Established by: Biopsy-proven Detailed Pathology: Stage IV non-small cell lung cancer, squamous cell carcinoma. Primary Tumor location:  Left lower lobe. 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 Chemotherapy: Yes; Ongoing? Yes; Most recent administration: 07/29/2020 Immunotherapy?  Yes; Type: Nivolumab; Ongoing? Yes Radiation therapy? Yes; Date Range: 09/02/2019 - 09/20/2019; Target: Left lung Other Cancer Therapies: Radioactive seed implant for prostate cancer 2003. EXAM: CT CHEST, ABDOMEN, AND PELVIS WITH CONTRAST TECHNIQUE: Multidetector CT imaging of the chest, abdomen and pelvis was performed  following the standard protocol during bolus administration of intravenous contrast. CONTRAST:  169m ISOVUE-300 IOPAMIDOL (ISOVUE-300) INJECTION 61% COMPARISON:  Most recent CT chest 05/25/2020.  09/13/2019 PET-CT. FINDINGS: CT CHEST FINDINGS Cardiovascular: Normal heart size. Three-vessel coronary artery disease. No pericardial effusion. Normal caliber of the thoracic aorta. The calcified and noncalcified atheromatous plaque in the thoracic aorta. No signs of aneurysmal dilation unchanged from previous imaging. RIGHT-sided Port-A-Cath terminates in the mid superior vena cava. Central pulmonary vasculature is normal caliber. Mediastinum/Nodes: Mild thickening of the esophagus with unchanged appearance. Enlarged subcarinal nodal tissue measuring approximately 1.9 cm short axis, 1.7 cm short axis on the prior study. 1.7 cm on the study from March of 2022. Tracheostomy tube in place as before. No thoracic inlet adenopathy. No axillary adenopathy. Lungs/Pleura: Scarring and bronchiectatic changes emanating from the LEFT hilum and extending into the superior segment of the LEFT lower lobe as well as into the upper lobe showing a similar appearance. Scattered LEFT-sided pulmonary nodules (image 34/4) LEFT upper lobe nodule measuring 6 mm similar to the prior exam. Adjacent small nodules in the LEFT upper lobe also appear stable. Similar appearance also of LEFT lower lobe pulmonary nodules, largest along the pleural surface at the LEFT lung base (image 84/4) 5-6 mm. Small LEFT-sided effusion similar as well in the mid chest, perhaps slightly increased at the LEFT lung base with minimal fluid. Ground-glass, bronchial wall thickening and subtle nodularity at the RIGHT lung base has developed since the study of May 25, 2020. This is isolated to this area. This is associated with some ill-defined and ground-glass nodules (image 103/4) in the 8-9 mm range in terms of largest areas. Smaller grouped nodules are present more  dependently in the RIGHT chest. Scarring along the RIGHT mediastinal border is stable. New pulmonary nodule RIGHT upper lobe (image 22/4) 3 mm. Other tiny nodules at the apex on the RIGHT are stable. Stable appearance of subtle nodule in the RIGHT middle lobe (image 95/4) 6 mm. Musculoskeletal: See below for full musculoskeletal details. CT ABDOMEN PELVIS FINDINGS Hepatobiliary: No focal, suspicious hepatic lesion. No pericholecystic stranding. Pancreas: Pancreas is unremarkable without signs of inflammation, ductal dilation or focal lesion. Spleen: Normal size and contour. Adrenals/Urinary Tract: Adrenal glands are normal. Symmetric renal enhancement. Urinary bladder is  collapsed partially the without signs of adjacent stranding. Extrarenal pelvis on the LEFT. No hydronephrosis. No suspicious renal lesion with small cyst in the upper pole. Small LEFT posterolateral urinary bladder diverticulum versus mildly patulous appearance of the distal LEFT ureter grossly similar to prior imaging. Stomach/Bowel: No acute small bowel process. New RIGHT inguinal hernia with small bowel extending into the inguinal canal just above the scrotum, moderately large. Stool throughout much of the colon. No signs of adjacent stranding. Colonic diverticulosis in the sigmoid. Vascular/Lymphatic: Calcified and noncalcified atheromatous plaque of the abdominal aorta. There is no gastrohepatic or hepatoduodenal ligament lymphadenopathy. No retroperitoneal or mesenteric lymphadenopathy. No pelvic sidewall lymphadenopathy. Reproductive: Brachytherapy seeds throughout the prostate gland. Other: No ascites. Musculoskeletal: No acute bone finding or destructive bone process. Spinal degenerative changes. IMPRESSION: Post treatment changes in the LEFT chest with stable nodules in the LEFT chest. Subcarinal lymph node potentially very slightly enlarged though within 1-2 mm of previous measurement. New tiny nodule in the RIGHT chest in the upper lobe  and small LEFT effusion. Grouped nodules and ground-glass in the RIGHT lower lobe more likely post infectious or inflammatory, based on distribution would correlate with any risk factors for or history of aspiration. Consider short interval follow-up for above findings. Signs of brachytherapy in the prostate with mild fullness of the distal ureters, grossly unchanged compared to previous imaging. Suggest attention on follow-up. Aortic Atherosclerosis (ICD10-I70.0). Electronically Signed   By: Zetta Bills M.D.   On: 08/19/2020 11:07     ASSESSMENT/PLAN:  This is a very pleasant 80 year old [name] diagnosed with stage IV (T3, N2, M1a) non-small cell lung cancer, squamous cell carcinoma.  He presented with a left lower lobe obstructive lung mass in addition to mediastinal lymphadenopathy.  He also presented with bilateral pulmonary nodules.  He was diagnosed in July 2021.  His PD-L1 expression is negative.   He completed palliative radiotherapy to the obstructive lung mass under the care of Dr. Lisbeth Renshaw in August 2021.   The patient is currently undergoing systemic chemotherapy with carboplatin and paclitaxel for 2 cycles in addition to immunotherapy with ipilimumab 1 mg/KG every 6 weeks and nivolumab 360 mg IV every 3 weeks.  He is status post day 1 of cycle 8   Labs were reviewed. Recommend that he proceed with day 22 cycle #9 today as scheduled.   We will see him back for a follow up visit in 3 weeks for evaluation before starting cycle #1 day 10  He will continue to follow with ENT for his tracheostomy.   The patient was advised to call immediately if he has any concerning symptoms in the interval. The patient voices understanding of current disease status and treatment options and is in agreement with the current care plan. All questions were answered. The patient knows to call the clinic with any problems, questions or concerns. We can certainly see the patient much sooner if  necessary  No orders of the defined types were placed in this encounter.    The total time spent in the appointment was 20-29 minutes   Shaun Amundson L Amos Micheals, PA-C 09/09/20

## 2020-09-09 ENCOUNTER — Other Ambulatory Visit: Payer: Self-pay

## 2020-09-09 ENCOUNTER — Ambulatory Visit: Payer: Medicare HMO

## 2020-09-09 ENCOUNTER — Inpatient Hospital Stay: Payer: Medicare HMO | Admitting: Physician Assistant

## 2020-09-09 ENCOUNTER — Inpatient Hospital Stay: Payer: Medicare HMO | Attending: Physician Assistant

## 2020-09-09 ENCOUNTER — Encounter: Payer: Self-pay | Admitting: Physician Assistant

## 2020-09-09 VITALS — BP 110/48 | HR 71 | Temp 97.1°F | Resp 19 | Ht 67.0 in | Wt 176.8 lb

## 2020-09-09 DIAGNOSIS — Z79899 Other long term (current) drug therapy: Secondary | ICD-10-CM | POA: Insufficient documentation

## 2020-09-09 DIAGNOSIS — C3490 Malignant neoplasm of unspecified part of unspecified bronchus or lung: Secondary | ICD-10-CM

## 2020-09-09 DIAGNOSIS — Z87891 Personal history of nicotine dependence: Secondary | ICD-10-CM | POA: Diagnosis not present

## 2020-09-09 DIAGNOSIS — C3432 Malignant neoplasm of lower lobe, left bronchus or lung: Secondary | ICD-10-CM

## 2020-09-09 DIAGNOSIS — I10 Essential (primary) hypertension: Secondary | ICD-10-CM | POA: Insufficient documentation

## 2020-09-09 DIAGNOSIS — C3412 Malignant neoplasm of upper lobe, left bronchus or lung: Secondary | ICD-10-CM | POA: Insufficient documentation

## 2020-09-09 DIAGNOSIS — J90 Pleural effusion, not elsewhere classified: Secondary | ICD-10-CM | POA: Diagnosis not present

## 2020-09-09 DIAGNOSIS — Z7984 Long term (current) use of oral hypoglycemic drugs: Secondary | ICD-10-CM | POA: Insufficient documentation

## 2020-09-09 DIAGNOSIS — Z85038 Personal history of other malignant neoplasm of large intestine: Secondary | ICD-10-CM | POA: Insufficient documentation

## 2020-09-09 DIAGNOSIS — Z95828 Presence of other vascular implants and grafts: Secondary | ICD-10-CM

## 2020-09-09 DIAGNOSIS — Z5112 Encounter for antineoplastic immunotherapy: Secondary | ICD-10-CM | POA: Diagnosis not present

## 2020-09-09 DIAGNOSIS — Z923 Personal history of irradiation: Secondary | ICD-10-CM | POA: Insufficient documentation

## 2020-09-09 DIAGNOSIS — E119 Type 2 diabetes mellitus without complications: Secondary | ICD-10-CM | POA: Diagnosis not present

## 2020-09-09 DIAGNOSIS — Z7982 Long term (current) use of aspirin: Secondary | ICD-10-CM | POA: Insufficient documentation

## 2020-09-09 LAB — CBC WITH DIFFERENTIAL (CANCER CENTER ONLY)
Abs Immature Granulocytes: 0.01 10*3/uL (ref 0.00–0.07)
Basophils Absolute: 0 10*3/uL (ref 0.0–0.1)
Basophils Relative: 0 %
Eosinophils Absolute: 0.4 10*3/uL (ref 0.0–0.5)
Eosinophils Relative: 7 %
HCT: 30.1 % — ABNORMAL LOW (ref 39.0–52.0)
Hemoglobin: 10.1 g/dL — ABNORMAL LOW (ref 13.0–17.0)
Immature Granulocytes: 0 %
Lymphocytes Relative: 29 %
Lymphs Abs: 1.5 10*3/uL (ref 0.7–4.0)
MCH: 31 pg (ref 26.0–34.0)
MCHC: 33.6 g/dL (ref 30.0–36.0)
MCV: 92.3 fL (ref 80.0–100.0)
Monocytes Absolute: 0.4 10*3/uL (ref 0.1–1.0)
Monocytes Relative: 8 %
Neutro Abs: 3 10*3/uL (ref 1.7–7.7)
Neutrophils Relative %: 56 %
Platelet Count: 246 10*3/uL (ref 150–400)
RBC: 3.26 MIL/uL — ABNORMAL LOW (ref 4.22–5.81)
RDW: 12.5 % (ref 11.5–15.5)
WBC Count: 5.4 10*3/uL (ref 4.0–10.5)
nRBC: 0 % (ref 0.0–0.2)

## 2020-09-09 LAB — CMP (CANCER CENTER ONLY)
ALT: 14 U/L (ref 0–44)
AST: 16 U/L (ref 15–41)
Albumin: 3.4 g/dL — ABNORMAL LOW (ref 3.5–5.0)
Alkaline Phosphatase: 50 U/L (ref 38–126)
Anion gap: 9 (ref 5–15)
BUN: 20 mg/dL (ref 8–23)
CO2: 25 mmol/L (ref 22–32)
Calcium: 9.1 mg/dL (ref 8.9–10.3)
Chloride: 104 mmol/L (ref 98–111)
Creatinine: 1.3 mg/dL — ABNORMAL HIGH (ref 0.61–1.24)
GFR, Estimated: 55 mL/min — ABNORMAL LOW (ref >60)
Glucose, Bld: 138 mg/dL — ABNORMAL HIGH (ref 70–99)
Potassium: 3.9 mmol/L (ref 3.5–5.1)
Sodium: 138 mmol/L (ref 135–145)
Total Bilirubin: 0.6 mg/dL (ref 0.3–1.2)
Total Protein: 7.8 g/dL (ref 6.5–8.1)

## 2020-09-09 MED ORDER — SODIUM CHLORIDE 0.9% FLUSH
10.0000 mL | Freq: Once | INTRAVENOUS | Status: AC
Start: 2020-09-09 — End: 2020-09-09
  Administered 2020-09-09: 10 mL
  Filled 2020-09-09: qty 10

## 2020-09-09 MED ORDER — SODIUM CHLORIDE 0.9% FLUSH
10.0000 mL | INTRAVENOUS | Status: DC | PRN
  Administered 2020-09-09: 10 mL
  Filled 2020-09-09: qty 10

## 2020-09-09 MED ORDER — SODIUM CHLORIDE 0.9 % IV SOLN
Freq: Once | INTRAVENOUS | Status: AC
  Filled 2020-09-09: qty 250

## 2020-09-09 MED ORDER — HEPARIN SOD (PORK) LOCK FLUSH 100 UNIT/ML IV SOLN
500.0000 [IU] | Freq: Once | INTRAVENOUS | Status: AC | PRN
  Administered 2020-09-09: 500 [IU]
  Filled 2020-09-09: qty 5

## 2020-09-09 MED ORDER — SODIUM CHLORIDE 0.9 % IV SOLN
360.0000 mg | Freq: Once | INTRAVENOUS | Status: AC
  Administered 2020-09-09: 360 mg via INTRAVENOUS
  Filled 2020-09-09: qty 12

## 2020-09-09 NOTE — Patient Instructions (Addendum)
[facility]  Discharge Instructions: Thank you for choosing [facility] to provide your oncology and hematology care.   If you have a lab appointment with the [facility], please go directly to the [facility] and check in at the registration area.   Wear comfortable clothing and clothing appropriate for easy access to any Portacath or PICC line.   We strive to give you quality time with your provider. You may need to reschedule your appointment if you arrive late (15 or more minutes).  Arriving late affects you and other patients whose appointments are after yours.  Also, if you miss three or more appointments without notifying the office, you may be dismissed from the clinic at the provider's discretion.      For prescription refill requests, have your pharmacy contact our office and allow 72 hours for refills to be completed.    Today you received the following chemotherapy and/or immunotherapy agents: opdivo   To help prevent nausea and vomiting after your treatment, we encourage you to take your nausea medication as directed.  BELOW ARE SYMPTOMS THAT SHOULD BE REPORTED IMMEDIATELY: *FEVER GREATER THAN 100.4 F (38 C) OR HIGHER *CHILLS OR SWEATING *NAUSEA AND VOMITING THAT IS NOT CONTROLLED WITH YOUR NAUSEA MEDICATION *UNUSUAL SHORTNESS OF BREATH *UNUSUAL BRUISING OR BLEEDING *URINARY PROBLEMS (pain or burning when urinating, or frequent urination) *BOWEL PROBLEMS (unusual diarrhea, constipation, pain near the anus) TENDERNESS IN MOUTH AND THROAT WITH OR WITHOUT PRESENCE OF ULCERS (sore throat, sores in mouth, or a toothache) UNUSUAL RASH, SWELLING OR PAIN  UNUSUAL VAGINAL DISCHARGE OR ITCHING   Items with * indicate a potential emergency and should be followed up as soon as possible or go to the Emergency Department if any problems should occur.  Please show the CHEMOTHERAPY ALERT CARD or IMMUNOTHERAPY ALERT CARD at check-in to the  Emergency Department and triage nurse.  Should you have questions after your visit or need to cancel or reschedule your appointment, please contact [facility]  Dept: [phone]  and follow the prompts.  Office hours are 8:00 a.m. to 4:30 p.m. Monday - Friday. Please note that voicemails left after 4:00 p.m. may not be returned until the following business day.  We are closed weekends and major holidays. You have access to a nurse at all times for urgent questions. Please call the main number to the clinic Dept: [phone] and follow the prompts.   For any non-urgent questions, you may also contact your provider using MyChart. We now offer e-Visits for anyone 28 and older to request care online for non-urgent symptoms. For details visit [email or web address].   Also download the MyChart app! Go to the app store, search "MyChart", open the app, select [facility], and log in with your MyChart username and password.  Due to Covid, a mask is required upon entering the hospital/clinic. If you do not have a mask, one will be given to you upon arrival. For doctor visits, patients may have 1 support person aged 12 or older with them. For treatment visits, patients cannot have anyone with them due to current Covid guidelines and our immunocompromised population.

## 2020-09-10 LAB — TSH: TSH: 1.197 u[IU]/mL (ref 0.320–4.118)

## 2020-09-11 ENCOUNTER — Telehealth: Payer: Self-pay | Admitting: Family Medicine

## 2020-09-11 ENCOUNTER — Other Ambulatory Visit: Payer: Self-pay

## 2020-09-11 MED ORDER — ONETOUCH DELICA PLUS LANCET33G MISC: 12 refills | Status: DC

## 2020-09-11 NOTE — Telephone Encounter (Signed)
Done KH 

## 2020-09-11 NOTE — Telephone Encounter (Signed)
Pt called and is requesting a refill on his lancets [facility]

## 2020-09-14 ENCOUNTER — Other Ambulatory Visit: Payer: Self-pay

## 2020-09-14 DIAGNOSIS — R202 Paresthesia of skin: Secondary | ICD-10-CM

## 2020-09-14 NOTE — Progress Notes (Signed)
emg 

## 2020-09-15 ENCOUNTER — Other Ambulatory Visit: Payer: Self-pay

## 2020-09-15 ENCOUNTER — Ambulatory Visit (INDEPENDENT_AMBULATORY_CARE_PROVIDER_SITE_OTHER): Payer: Medicare HMO | Admitting: Neurology

## 2020-09-15 DIAGNOSIS — R202 Paresthesia of skin: Secondary | ICD-10-CM

## 2020-09-15 DIAGNOSIS — C3432 Malignant neoplasm of lower lobe, left bronchus or lung: Secondary | ICD-10-CM | POA: Diagnosis not present

## 2020-09-15 DIAGNOSIS — J386 Stenosis of larynx: Secondary | ICD-10-CM | POA: Diagnosis not present

## 2020-09-15 DIAGNOSIS — J383 Other diseases of vocal cords: Secondary | ICD-10-CM | POA: Diagnosis not present

## 2020-09-15 DIAGNOSIS — Z930 Tracheostomy status: Secondary | ICD-10-CM | POA: Diagnosis not present

## 2020-09-15 DIAGNOSIS — G5623 Lesion of ulnar nerve, bilateral upper limbs: Secondary | ICD-10-CM

## 2020-09-15 DIAGNOSIS — G5603 Carpal tunnel syndrome, bilateral upper limbs: Secondary | ICD-10-CM

## 2020-09-15 NOTE — Procedures (Signed)
[facility] Neurology  [place], [place]  [place], [place] [address] Tel: [phone] Fax:  [phone] Test Date:  09/15/2020  Patient: Shaun Kennedy DOB: Jan 20, 1941 Physician: Narda Amber, DO  Sex: Male Height: 5\' 7"  Ref Phys: Elsie Saas, MD  ID#: [id number]   Technician:    Patient Complaints: This is a 80 year old man referred for evaluation of bilateral hand paresthesias and left hand weakness.  NCV & EMG Findings: Extensive electrodiagnostic testing of the left upper extremity and additional studies of the right shows: Left median sensory response shows prolonged latency (4.7 ms) and reduced amplitude (7.4 V).  Left ulnar sensory response is absent.  Right median sensory and the right ulnar sensory nerves shows prolonged distal peak latency (R4.1, R3.3 ms).  Bilateral radial sensory responses are within normal limits. Left median motor response shows prolonged latency (5.5 ms).  Left ulnar motor response shows severely reduced amplitude, prolonged latency (4.3 ms), and decreased conduction velocity across the elbow (A Elbow-B Elbow, 23 m/s).  Right ulnar motor response shows slowed conduction velocity across the elbow (A Elbow-B Elbow, 42 m/s).   Severe chronic motor axonal loss changes are seen affecting the ulnar innervated muscles on the left, without accompanying active denervation.  These findings are not present in the right upper extremity.  Impression: Left ulnar neuropathy with slowing across the elbow, with demyelinating and axonal features, very severe. Left median neuropathy at or distal to the wrist (moderate), consistent with a clinical diagnosis of carpal tunnel syndrome.   Right ulnar neuropathy with slowing across the elbow, purely demyelinating, mild. Right median neuropathy at or distal to the wrist (mild), consistent with a clinical diagnosis of carpal tunnel syndrome.     ___________________________ Narda Amber, DO    Nerve Conduction  Studies Anti Sensory Summary Table   Stim Site NR Peak (ms) Norm Peak (ms) P-T Amp (V) Norm P-T Amp  Left Median Anti Sensory (2nd Digit)  32C  Wrist    4.7 <3.8 7.4 >10  Right Median Anti Sensory (2nd Digit)  32C  Wrist    4.1 <3.8 11.1 >10  Left Radial Anti Sensory (Base 1st Digit)  32C  Wrist    2.7 <2.8 10.4 >10  Right Radial Anti Sensory (Base 1st Digit)  32C  Wrist    2.4 <2.8 13.0 >10  Left Ulnar Anti Sensory (5th Digit)  32C  Wrist NR  <3.2  >5  Right Ulnar Anti Sensory (5th Digit)  32C  Wrist    3.3 <3.2 11.5 >5   Motor Summary Table   Stim Site NR Onset (ms) Norm Onset (ms) O-P Amp (mV) Norm O-P Amp Site1 Site2 Delta-0 (ms) Dist (cm) Vel (m/s) Norm Vel (m/s)  Left Median Motor (Abd Poll Brev)  32C  Wrist    5.5 <4.0 8.0 >5 Elbow Wrist 5.3 30.0 57 >50  Elbow    10.8  7.1         Right Median Motor (Abd Poll Brev)  32C  Wrist    3.7 <4.0 10.5 >5 Elbow Wrist 5.8 29.0 50 >50  Elbow    9.5  9.3         Left Ulnar Motor (Abd Dig Minimi)  32C  Wrist    4.3 <3.1 0.7 >7 B Elbow Wrist 4.4 23.0 52 >50  B Elbow    8.7  0.7  A Elbow B Elbow 4.3 10.0 23 >50  A Elbow    13.0  0.6  Right Ulnar Motor (Abd Dig Minimi)  32C  Wrist    2.6 <3.1 9.0 >7 B Elbow Wrist 4.4 23.0 52 >50  B Elbow    7.0  8.3  A Elbow B Elbow 2.4 10.0 42 >50  A Elbow    9.4  7.8          EMG   Side Muscle Ins Act Fibs Psw Fasc Number Recrt Dur Dur. Amp Amp. Poly Poly. Comment  Right 1stDorInt Nml Nml Nml Nml Nml Nml Nml Nml Nml Nml Nml Nml N/A  Right Abd Poll Brev Nml Nml Nml Nml Nml Nml Nml Nml Nml Nml Nml Nml N/A  Right PronatorTeres Nml Nml Nml Nml Nml Nml Nml Nml Nml Nml Nml Nml N/A  Right Biceps Nml Nml Nml Nml Nml Nml Nml Nml Nml Nml Nml Nml N/A  Right Triceps Nml Nml Nml Nml Nml Nml Nml Nml Nml Nml Nml Nml N/A  Right Deltoid Nml Nml Nml Nml Nml Nml Nml Nml Nml Nml Nml Nml N/A  Right FlexCarpiUln Nml Nml Nml Nml Nml Nml Nml Nml Nml Nml Nml Nml N/A  Left 1stDorInt Nml Nml Nml Nml [facility]U  Rapid All 1+ All 1+ All 1+ N/A  Left Abd Poll Brev Nml Nml Nml Nml Nml Nml Nml Nml Nml Nml Nml Nml N/A  Left PronatorTeres Nml Nml Nml Nml Nml Nml Nml Nml Nml Nml Nml Nml N/A  Left Biceps Nml Nml Nml Nml Nml Nml Nml Nml Nml Nml Nml Nml N/A  Left Triceps Nml Nml Nml Nml Nml Nml Nml Nml Nml Nml Nml Nml N/A  Left Deltoid Nml Nml Nml Nml Nml Nml Nml Nml Nml Nml Nml Nml N/A  Left ABD Dig Min Nml Nml Nml Nml [facility] Rapid All 1+ All 1+ All 1+ N/A  Left FlexCarpiUln Nml Nml Nml Nml 3- Rapid Most 1+ Most 1+ Most 1+ N/A      Waveforms:

## 2020-09-21 ENCOUNTER — Other Ambulatory Visit: Payer: Self-pay

## 2020-09-21 NOTE — Patient Outreach (Signed)
[facility]) Care Management  09/21/2020  Shaun Kennedy [birth year] [id number]   Telephone Assessment  Successful outreach call to patient's spouse. She reports patient is doing fairly well and no new issues or concerns at present. Family is planning a family gathering to celebrate patient's upcoming birthday. As well. They are planning family trip to [place] in Nov. Spouse will talk to MD to see if patient will be "cleared" to be able travel and fly by then. She denies any RN CM needs or concerns at this time.    Medications Reviewed Today     Reviewed by [name], RN (Registered Nurse) on 09/21/20 at [phone]  Med List Status: <None>   Medication Order Taking? Sig Documenting Provider Last Dose Status Informant  acetaminophen (TYLENOL) 500 MG tablet [id number] No Take 1,000 mg by mouth every 4 (four) hours as needed.  Patient not taking: No sig reported   [provider] Not Taking Active   amLODipine (NORVASC) 5 MG tablet [id number] No Take 1 tablet (5 mg total) by mouth daily. [name], MD Taking Active Spouse/Significant Other  ANORO ELLIPTA 62.5-25 MCG/INH AEPB [id number] No Inhale 1 puff into the lungs daily.  Patient not taking: No sig reported   [provider] Not Taking Active   aspirin EC 81 MG tablet [id number] No Take 81 mg by mouth daily after breakfast.  [provider] Taking Active Spouse/Significant Other  Blood Glucose Monitoring Suppl [facility] VERIO) w/Device KIT [id number] No USE TO CHECK SUGAR DAILY [name], MD Taking Active Spouse/Significant Other  dronabinol (MARINOL) 2.5 MG capsule [id number] No Take 1 capsule (2.5 mg total) by mouth 2 (two) times daily before a meal. [name], MD Taking Active   ferrous sulfate 325 (65 FE) MG EC tablet [id number] No Take 1 tablet (325 mg total) by mouth daily with breakfast. [name], MD Taking Active Spouse/Significant Other   HYDROcodone-acetaminophen (NORCO/VICODIN) 5-325 MG tablet [id number] No Take 1 tablet by mouth every 4 (four) hours as needed. Isla Pence, MD Taking Active   Lancet Devices (TRUEDRAW LANCING DEVICE) MISC [id number]  Check sugars daily [name], MD  Active   Lancets (ONETOUCH DELICA PLUS [id number]) [place] [id number]  PATIENT IS TO TEST TWO TIMES A DAY DX: E11.9 (ONETOUCH VERIO FLEX) [name], MD  Active   metFORMIN (GLUCOPHAGE-XR) 500 MG 24 hr tablet [id number] No TAKE 1 TABLET EVERY DAY [name], MD Taking Active   Multiple Vitamin (MULTIVITAMIN WITH MINERALS) TABS tablet [id number] No Take 1 tablet by mouth daily after breakfast.  [provider] Taking Active Spouse/Significant Other  senna-docusate (SENOKOT-S) 8.6-50 MG tablet [id number] No Take 2 tablets by mouth 2 (two) times daily.  Patient not taking: No sig reported   Nita Sells, MD Not Taking Active   simvastatin (ZOCOR) 40 MG tablet [id number] No Take 40 mg by mouth daily. [provider] Unknown Active   solifenacin (VESICARE) 5 MG tablet [id number] No TAKE 1 TABLET EVERY DAY [name], MD Taking Active              Goals Addressed               This Visit's Progress     (THN)Follow My Treatment Plan-Chemotherapy Adherence (pt-stated)        Timeframe:  Short-Term Goal Priority:  High Start Date:  08/07/2020  Expected End Date:Sept 2022                       Follow Up Date:Sept2022   Barriers: Knowledge   - call the doctor or nurse to get help with side effects - keep a list of all the medicines I take; vitamins and herbals too - keep follow-up appointments    Why is this important?   Following your treatment plan will help keep your care on track.  Medicine may be the most important piece of your plan.  There are many reasons why you might want to stop taking medicine. You may get tired of taking your medicine. You may think medicine  costs too much money. You may find the side effects are too much to bear.  Try some of these steps to make following the treatment plan a little easier.     Notes:   08/07/20-Patient tolerating chemo txs well so far at present. Denies any SEs. He is due for upcoming scans to assess status of cancer within the next few months.   09/21/20-Patient tolerating chemo txs fairly well-no reported SEs except appetite not as good as it was-supplementing with shakes. Spouse inquiring about questions if txs "working and helping" and encouraged her to discuss with oncologist.       (THN)Optimal Respiratory Status (pt-stated)          Timeframe:  Short-Term Goal Priority:  High Start Date:      07/08/2020                       Expected End Date: Aug 2022 Follow Up Date: Aug2022   Barriers: Health Behaviors Knowledge                        -pt/family will perform trach care at home -pt/family will monitor for s/s of infection or impaired resp status -pt will f/u with ENT as ordered   Evidence-based guidance:   Anticipate/prepare caregiver for hospitalization with persistent symptoms including increased respiratory effort, tachypnea, hypoxemia, apnea, feeding intolerance; consider presence of severe disease risk factors.  Encourage caregiver to clear nasal congestion (especially before feeding) based on work of breathing.  Use techniques, such as superficial nasal suction, elevating head of bed when able, making frequent position changes, decreasing stimulation, clustering care to conserve energy and increase oxygenation, promoting rest.   If high-risk, anticipate/prepare child and caregiver for administration of palivizumab administered intramuscularly once a month through respiratory syncytial virus (RSV) season to a maximum of 5 doses.  Encourage caregiver tobacco cessation; support avoidance of second-hand smoke exposure.  Promote infection prevention techniques: caregiver handwashing, avoiding  contact with those with respiratory illnesses and/or second-hand smoke exposure and continuing breastfeeding until at least 78 months of age.  Provide anticipatory guidance to parent/caregiver regarding awareness of worsening signs/symptoms and when to call provider; acknowledge fear and provide support and reassurance.    Notes:   07/08/2020-Patient with complaints of phlegm/mucus despite performing freq trach care throughout the day. He is going to be evaluated by MD today. Denies any s/s of resp infection.   08/07/20-Patient report trach intact and no recent issues. He saw ENT a few wks ago. He continue to have mucus production-MD aware and advised normal. No s/s of infection. SOB managed at present.   09/21/2020-Spouse reports no SOB or resp issues. Patient with chronic cough related to trach. Lurline Idol has been functional and no recent  issues.       (THN)Set My Target A1C-Diabetes Type 2 (pt-stated)        Timeframe:  Long-Range Goal Priority:  Medium Start Date: 04/27/2020                            Expected End Date:  Dec 2022                     Follow Up Date Sept 2022    Barriers: Health Behaviors Knowledge   - set target A1C  -monitor cbgs as ordered -complete MD appts as ordered   Why is this important?   Your target A1C is decided together by you and your doctor.  It is based on several things like your age and other health issues.    Notes:  04/27/2020-Patient went for PCP appt today and had A1C level drawn-results pending. Last A1C on file 5.9(Jan 2022).  05/12/2020-Spouse reports appetite gradually increasing-blood sugars WNL. A1C lab result was 6.2(04/27/20)  06/08/20-Caregivers report pt continues to maintain good appetite. Blood sugars have ben in the low to mid 100s.  07/08/2020-Patient reports appetite remains good and WNL for him. Wgt stable. Family monitoring cbgs but unsure of reading this morning-unable to recall. No current labwork on file.   08/07/20-Patient saw PCP  recently and had labwork done. A1C 6.0 down from 6.5. Pt/family continues to monitor cbgs in the home. He voices adherence to diabetic diet.   09/21/20-Spouse reports appetite fair. Patient supplementing by drinking about to Ensures per day. Wgt stable at 175lbs. Blood sugars controlled.          Plan:  RN CM discussed with caregiver next outreach within 4-6wks. Caregiver agrees to care plan and follow up. Caregiver gave verbal consent and in agreement with RN CM follow up and timeframe. Caregiver aware that they may contact RN CM sooner for any issues or concerns. RN CM reviewed goals and plan of care with caregiver.   [name], RN,BSN,CCM [facility] Telephonic Care Management Coordinator Direct Phone: [phone] Toll Free: [phone] Fax: [phone]

## 2020-09-30 ENCOUNTER — Inpatient Hospital Stay: Payer: Medicare HMO

## 2020-09-30 ENCOUNTER — Other Ambulatory Visit: Payer: Self-pay

## 2020-09-30 ENCOUNTER — Inpatient Hospital Stay: Payer: Medicare HMO | Admitting: Internal Medicine

## 2020-09-30 ENCOUNTER — Encounter: Payer: Self-pay | Admitting: Internal Medicine

## 2020-09-30 VITALS — BP 124/60 | HR 70 | Temp 97.4°F | Resp 19 | Ht 67.0 in | Wt 178.7 lb

## 2020-09-30 DIAGNOSIS — Z95828 Presence of other vascular implants and grafts: Secondary | ICD-10-CM

## 2020-09-30 DIAGNOSIS — C3412 Malignant neoplasm of upper lobe, left bronchus or lung: Secondary | ICD-10-CM | POA: Diagnosis not present

## 2020-09-30 DIAGNOSIS — Z5112 Encounter for antineoplastic immunotherapy: Secondary | ICD-10-CM

## 2020-09-30 DIAGNOSIS — E119 Type 2 diabetes mellitus without complications: Secondary | ICD-10-CM | POA: Diagnosis not present

## 2020-09-30 DIAGNOSIS — Z7984 Long term (current) use of oral hypoglycemic drugs: Secondary | ICD-10-CM | POA: Diagnosis not present

## 2020-09-30 DIAGNOSIS — C3490 Malignant neoplasm of unspecified part of unspecified bronchus or lung: Secondary | ICD-10-CM

## 2020-09-30 DIAGNOSIS — J90 Pleural effusion, not elsewhere classified: Secondary | ICD-10-CM | POA: Diagnosis not present

## 2020-09-30 DIAGNOSIS — C3432 Malignant neoplasm of lower lobe, left bronchus or lung: Secondary | ICD-10-CM | POA: Diagnosis not present

## 2020-09-30 DIAGNOSIS — I10 Essential (primary) hypertension: Secondary | ICD-10-CM | POA: Diagnosis not present

## 2020-09-30 DIAGNOSIS — Z7982 Long term (current) use of aspirin: Secondary | ICD-10-CM | POA: Diagnosis not present

## 2020-09-30 DIAGNOSIS — Z87891 Personal history of nicotine dependence: Secondary | ICD-10-CM | POA: Diagnosis not present

## 2020-09-30 DIAGNOSIS — Z85038 Personal history of other malignant neoplasm of large intestine: Secondary | ICD-10-CM | POA: Diagnosis not present

## 2020-09-30 LAB — CMP (CANCER CENTER ONLY)
ALT: 12 U/L (ref 0–44)
AST: 16 U/L (ref 15–41)
Albumin: 3.5 g/dL (ref 3.5–5.0)
Alkaline Phosphatase: 52 U/L (ref 38–126)
Anion gap: 7 (ref 5–15)
BUN: 20 mg/dL (ref 8–23)
CO2: 26 mmol/L (ref 22–32)
Calcium: 9.3 mg/dL (ref 8.9–10.3)
Chloride: 106 mmol/L (ref 98–111)
Creatinine: 1.47 mg/dL — ABNORMAL HIGH (ref 0.61–1.24)
GFR, Estimated: 48 mL/min — ABNORMAL LOW (ref >60)
Glucose, Bld: 85 mg/dL (ref 70–99)
Potassium: 3.9 mmol/L (ref 3.5–5.1)
Sodium: 139 mmol/L (ref 135–145)
Total Bilirubin: 0.8 mg/dL (ref 0.3–1.2)
Total Protein: 7.9 g/dL (ref 6.5–8.1)

## 2020-09-30 LAB — CBC WITH DIFFERENTIAL (CANCER CENTER ONLY)
Abs Immature Granulocytes: 0.01 10*3/uL (ref 0.00–0.07)
Basophils Absolute: 0 10*3/uL (ref 0.0–0.1)
Basophils Relative: 1 %
Eosinophils Absolute: 0.4 10*3/uL (ref 0.0–0.5)
Eosinophils Relative: 7 %
HCT: 30.7 % — ABNORMAL LOW (ref 39.0–52.0)
Hemoglobin: 10.1 g/dL — ABNORMAL LOW (ref 13.0–17.0)
Immature Granulocytes: 0 %
Lymphocytes Relative: 26 %
Lymphs Abs: 1.5 10*3/uL (ref 0.7–4.0)
MCH: 30.8 pg (ref 26.0–34.0)
MCHC: 32.9 g/dL (ref 30.0–36.0)
MCV: 93.6 fL (ref 80.0–100.0)
Monocytes Absolute: 0.6 10*3/uL (ref 0.1–1.0)
Monocytes Relative: 10 %
Neutro Abs: 3.3 10*3/uL (ref 1.7–7.7)
Neutrophils Relative %: 56 %
Platelet Count: 260 10*3/uL (ref 150–400)
RBC: 3.28 MIL/uL — ABNORMAL LOW (ref 4.22–5.81)
RDW: 13.2 % (ref 11.5–15.5)
WBC Count: 5.9 10*3/uL (ref 4.0–10.5)
nRBC: 0 % (ref 0.0–0.2)

## 2020-09-30 LAB — TSH: TSH: 0.767 u[IU]/mL (ref 0.320–4.118)

## 2020-09-30 MED ORDER — SODIUM CHLORIDE 0.9 % IV SOLN
360.0000 mg | Freq: Once | INTRAVENOUS | Status: AC
  Administered 2020-09-30: 360 mg via INTRAVENOUS
  Filled 2020-09-30: qty 24

## 2020-09-30 MED ORDER — FAMOTIDINE 20 MG IN NS 100 ML IVPB
20.0000 mg | Freq: Once | INTRAVENOUS | Status: AC
  Administered 2020-09-30: 20 mg via INTRAVENOUS
  Filled 2020-09-30: qty 100

## 2020-09-30 MED ORDER — SODIUM CHLORIDE 0.9 % IV SOLN: Freq: Once | INTRAVENOUS | Status: AC

## 2020-09-30 MED ORDER — DIPHENHYDRAMINE HCL 50 MG/ML IJ SOLN
25.0000 mg | Freq: Once | INTRAMUSCULAR | Status: AC
  Administered 2020-09-30: 25 mg via INTRAVENOUS
  Filled 2020-09-30: qty 1

## 2020-09-30 MED ORDER — SODIUM CHLORIDE 0.9 % IV SOLN
1.0000 mg/kg | Freq: Once | INTRAVENOUS | Status: AC
  Administered 2020-09-30: 80 mg via INTRAVENOUS
  Filled 2020-09-30: qty 16

## 2020-09-30 MED ORDER — SODIUM CHLORIDE 0.9% FLUSH
10.0000 mL | Freq: Once | INTRAVENOUS | Status: AC
Start: 2020-09-30 — End: 2020-09-30
  Administered 2020-09-30: 10 mL

## 2020-09-30 MED ORDER — SODIUM CHLORIDE 0.9% FLUSH
10.0000 mL | INTRAVENOUS | Status: DC | PRN
  Administered 2020-09-30: 10 mL

## 2020-09-30 MED ORDER — HEPARIN SOD (PORK) LOCK FLUSH 100 UNIT/ML IV SOLN
500.0000 [IU] | Freq: Once | INTRAVENOUS | Status: AC | PRN
  Administered 2020-09-30: 500 [IU]

## 2020-09-30 NOTE — Patient Instructions (Signed)
[facility]  Discharge Instructions: Thank you for choosing [facility] to provide your oncology and hematology care.   If you have a lab appointment with the [facility], please go directly to the [facility] and check in at the registration area.   Wear comfortable clothing and clothing appropriate for easy access to any Portacath or PICC line.   We strive to give you quality time with your provider. You may need to reschedule your appointment if you arrive late (15 or more minutes).  Arriving late affects you and other patients whose appointments are after yours.  Also, if you miss three or more appointments without notifying the office, you may be dismissed from the clinic at the provider's discretion.      For prescription refill requests, have your pharmacy contact our office and allow 72 hours for refills to be completed.    Today you received the following chemotherapy and/or immunotherapy agents : Rae Halsted      To help prevent nausea and vomiting after your treatment, we encourage you to take your nausea medication as directed.  BELOW ARE SYMPTOMS THAT SHOULD BE REPORTED IMMEDIATELY: *FEVER GREATER THAN 100.4 F (38 C) OR HIGHER *CHILLS OR SWEATING *NAUSEA AND VOMITING THAT IS NOT CONTROLLED WITH YOUR NAUSEA MEDICATION *UNUSUAL SHORTNESS OF BREATH *UNUSUAL BRUISING OR BLEEDING *URINARY PROBLEMS (pain or burning when urinating, or frequent urination) *BOWEL PROBLEMS (unusual diarrhea, constipation, pain near the anus) TENDERNESS IN MOUTH AND THROAT WITH OR WITHOUT PRESENCE OF ULCERS (sore throat, sores in mouth, or a toothache) UNUSUAL RASH, SWELLING OR PAIN  UNUSUAL VAGINAL DISCHARGE OR ITCHING   Items with * indicate a potential emergency and should be followed up as soon as possible or go to the Emergency Department if any problems should occur.  Please show the CHEMOTHERAPY ALERT CARD or IMMUNOTHERAPY ALERT CARD at  check-in to the Emergency Department and triage nurse.  Should you have questions after your visit or need to cancel or reschedule your appointment, please contact [facility]  Dept: [phone]  and follow the prompts.  Office hours are 8:00 a.m. to 4:30 p.m. Monday - Friday. Please note that voicemails left after 4:00 p.m. may not be returned until the following business day.  We are closed weekends and major holidays. You have access to a nurse at all times for urgent questions. Please call the main number to the clinic Dept: [phone] and follow the prompts.   For any non-urgent questions, you may also contact your provider using MyChart. We now offer e-Visits for anyone 18 and older to request care online for non-urgent symptoms. For details visit [email or web address].   Also download the MyChart app! Go to the app store, search "MyChart", open the app, select [facility], and log in with your MyChart username and password.  Due to Covid, a mask is required upon entering the hospital/clinic. If you do not have a mask, one will be given to you upon arrival. For doctor visits, patients may have 1 support person aged 13 or older with them. For treatment visits, patients cannot have anyone with them due to current Covid guidelines and our immunocompromised population.

## 2020-09-30 NOTE — Progress Notes (Signed)
[facility] [phone]   [phone]  OFFICE PROGRESS NOTE  [name], [place] [place] [address]  DIAGNOSIS: stage IV (T3, N2, M1 a) non-small cell lung cancer, squamous cell carcinoma presented with obstructive left lower lobe lung mass in addition to mediastinal lymphadenopathy as well as bilateral pulmonary nodules diagnosed in July 2021.   PDL1: 0%   PRIOR THERAPY: None   CURRENT THERAPY: 1) Palliative radiotherapy to the obstructive left lower lobe lung mass under the care of Dr. Lisbeth Renshaw.  2)  systemic chemotherapy 2 cycles of chemotherapy with carboplatin for an AUC of 5 and paclitaxel 175 mg/m2 in addition to immunotherapy with nivolumab 360 mg every 3 weeks and ipilimumab 1 mg/kg IV every 6 weeks followed by maintenance nivolumab and ipilimumab.  He started the first treatment on 09/04/2019.  He is status post 9 cycles of the treatment.  INTERVAL HISTORY: Shaun Kennedy 80 y.o. male returns to the clinic today for follow-up visit.  The patient is feeling fine today with no concerning complaints except for the baseline shortness of breath and wheezing.  He denied having any chest pain, cough or hemoptysis.  He denied having any fever or chills.  He has no nausea, vomiting, diarrhea or constipation.  He has no headache or visual changes.  He continues to tolerate his treatment with ipilimumab and nivolumab fairly well.  He is here today for evaluation before starting cycle #10 of his treatment.  MEDICAL HISTORY: Past Medical History:  Diagnosis Date   Allergic rhinitis    Asthma    Carotid stenosis    Colon cancer ([facility]) 2003   Diabetes mellitus ([facility])    Diverticulosis    Dyslipidemia    ED (erectile dysfunction)    GERD (gastroesophageal reflux disease)    H/O degenerative disc disease    Hemorrhoids    HTN (hypertension)    Hyperlipidemia    Lung cancer ([facility])    LVH (left ventricular hypertrophy)    on EKG    Mass of lower lobe of left lung    Mediastinal adenopathy    Smoker    former   Wears dentures    Wears dentures    Wears glasses    Wears glasses     ALLERGIES:  has No Known Allergies.  MEDICATIONS:  Current Outpatient Medications  Medication Sig Dispense Refill   acetaminophen (TYLENOL) 500 MG tablet Take 1,000 mg by mouth every 4 (four) hours as needed. (Patient not taking: No sig reported)     amLODipine (NORVASC) 5 MG tablet Take 1 tablet (5 mg total) by mouth daily. 90 tablet 3   ANORO ELLIPTA 62.5-25 MCG/INH AEPB Inhale 1 puff into the lungs daily. (Patient not taking: No sig reported)     aspirin EC 81 MG tablet Take 81 mg by mouth daily after breakfast.      Blood Glucose Monitoring Suppl (ONETOUCH VERIO) w/Device KIT USE TO CHECK SUGAR DAILY 1 kit 0   dronabinol (MARINOL) 2.5 MG capsule Take 1 capsule (2.5 mg total) by mouth 2 (two) times daily before a meal. 60 capsule 1   ferrous sulfate 325 (65 FE) MG EC tablet Take 1 tablet (325 mg total) by mouth daily with breakfast. 30 tablet 12   HYDROcodone-acetaminophen (NORCO/VICODIN) 5-325 MG tablet Take 1 tablet by mouth every 4 (four) hours as needed. 10 tablet 0   Lancet Devices (TRUEDRAW LANCING DEVICE) MISC Check sugars daily 90  each 0   Lancets (ONETOUCH DELICA PLUS NOMVEH20N) MISC PATIENT IS TO TEST TWO TIMES A DAY DX: E11.9 (ONETOUCH VERIO FLEX) 100 each 12   metFORMIN (GLUCOPHAGE-XR) 500 MG 24 hr tablet TAKE 1 TABLET EVERY DAY 90 tablet 1   Multiple Vitamin (MULTIVITAMIN WITH MINERALS) TABS tablet Take 1 tablet by mouth daily after breakfast.      senna-docusate (SENOKOT-S) 8.6-50 MG tablet Take 2 tablets by mouth 2 (two) times daily. (Patient not taking: No sig reported) 30 tablet 0   simvastatin (ZOCOR) 40 MG tablet Take 40 mg by mouth daily.     solifenacin (VESICARE) 5 MG tablet TAKE 1 TABLET EVERY DAY 90 tablet 1   No current facility-administered medications for this visit.   Facility-Administered Medications  Ordered in Other Visits  Medication Dose Route Frequency Provider Last Rate Last Admin   sodium chloride flush (NS) 0.9 % injection 10 mL  10 mL Intracatheter PRN Curt Bears, MD   10 mL at 09/04/19 1556    SURGICAL HISTORY:  Past Surgical History:  Procedure Laterality Date   BRONCHIAL BIOPSY  08/20/2019   Procedure: BRONCHIAL BIOPSIES;  Surgeon: Collene Gobble, MD;  Location: [facility] ENDOSCOPY;  Service: Pulmonary;;   BRONCHIAL BRUSHINGS  08/20/2019   Procedure: BRONCHIAL BRUSHINGS;  Surgeon: Collene Gobble, MD;  Location: [facility] ENDOSCOPY;  Service: Pulmonary;;   BRONCHIAL NEEDLE ASPIRATION BIOPSY  08/20/2019   Procedure: BRONCHIAL NEEDLE ASPIRATION BIOPSIES;  Surgeon: Collene Gobble, MD;  Location: [facility];  Service: Pulmonary;;   CARPAL TUNNEL RELEASE Right 01/09/2019   Procedure: CARPAL TUNNEL RELEASE;  Surgeon: Leandrew Koyanagi, MD;  Location: [facility];  Service: Orthopedics;  Laterality: Right;   CATARACT EXTRACTION Right 2018   COLONOSCOPY  2007   Dr. Benson Norway   DIRECT LARYNGOSCOPY N/A 04/10/2020   Procedure: DIRECT LARYNGOSCOPY;  Surgeon: Melida Quitter, MD;  Location: [facility];  Service: ENT;  Laterality: N/A;   I & D EXTREMITY Right 04/02/2016   Procedure: IRRIGATION AND DEBRIDEMENT GREAT TOE;  Surgeon: Leandrew Koyanagi, MD;  Location: [facility];  Service: Orthopedics;  Laterality: Right;   IR IMAGING GUIDED PORT INSERTION  08/30/2019   MULTIPLE TOOTH EXTRACTIONS     RADIOACTIVE SEED IMPLANT  2003   TRACHEOSTOMY TUBE PLACEMENT N/A 04/10/2020   Procedure: TRACHEOSTOMY;  Surgeon: Melida Quitter, MD;  Location: [facility];  Service: ENT;  Laterality: N/A;   ULNAR TUNNEL RELEASE Right 01/09/2019   Procedure: RIGHT CUBITAL TUNNEL RELEASE AND CARPAL TUNNEL RELEASE;  Surgeon: Leandrew Koyanagi, MD;  Location: [facility];  Service: Orthopedics;  Laterality: Right;   UPPER GASTROINTESTINAL ENDOSCOPY     VIDEO BRONCHOSCOPY N/A 12/18/2019   Procedure: VIDEO BRONCHOSCOPY WITHOUT FLUORO;   Surgeon: Collene Gobble, MD;  Location: [facility] ENDOSCOPY;  Service: Cardiopulmonary;  Laterality: N/A;   VIDEO BRONCHOSCOPY WITH ENDOBRONCHIAL ULTRASOUND N/A 08/20/2019   Procedure: VIDEO BRONCHOSCOPY WITH ENDOBRONCHIAL ULTRASOUND;  Surgeon: Collene Gobble, MD;  Location[facility] ENDOSCOPY;  Service: Pulmonary;  Laterality: N/A;    REVIEW OF SYSTEMS:  A comprehensive review of systems was negative except for: Respiratory: positive for dyspnea on exertion and wheezing   PHYSICAL EXAMINATION: General appearance: alert, cooperative, fatigued, and no distress Head: Normocephalic, without obvious abnormality, atraumatic Neck: no adenopathy, no JVD, supple, symmetrical, trachea midline, and thyroid not enlarged, symmetric, no tenderness/mass/nodules Lymph nodes: Cervical, supraclavicular, and axillary nodes normal. Resp: wheezes bilaterally Back: symmetric, no curvature. ROM normal. No CVA tenderness. Cardio: regular  rate and rhythm, S1, S2 normal, no murmur, click, rub or gallop GI: soft, non-tender; bowel sounds normal; no masses,  no organomegaly Extremities: extremities normal, atraumatic, no cyanosis or edema  ECOG PERFORMANCE STATUS: 1 - Symptomatic but completely ambulatory  Blood pressure 124/60, pulse 70, temperature (!) 97.4 F (36.3 C), temperature source Tympanic, resp. rate 19, height _0  (1.702 m), weight 178 lb 11.2 oz (81.1 kg), SpO2 99 %.  LABORATORY DATA: Lab Results  Component Value Date   WBC 5.9 09/30/2020   HGB 10.1 (L) 09/30/2020   HCT 30.7 (L) 09/30/2020   MCV 93.6 09/30/2020   PLT 260 09/30/2020      Chemistry      Component Value Date/Time   NA 138 09/09/2020 1015   NA 132 (L) 08/14/2019 0918   K 3.9 09/09/2020 1015   CL 104 09/09/2020 1015   CO2 25 09/09/2020 1015   BUN 20 09/09/2020 1015   BUN 16 08/14/2019 0918   CREATININE 1.30 (H) 09/09/2020 1015   CREATININE 1.25 (H) 08/08/2016 0912      Component Value Date/Time   CALCIUM 9.1 09/09/2020 1015    ALKPHOS 50 09/09/2020 1015   AST 16 09/09/2020 1015   ALT 14 09/09/2020 1015   BILITOT 0.6 09/09/2020 1015       RADIOGRAPHIC STUDIES: DG Chest 2 View  Result Date: 09/01/2020 CLINICAL DATA:  Cough. EXAM: CHEST - 2 VIEW COMPARISON:  May 27, 2020.  August 19, 2020. FINDINGS: The heart size and mediastinal contours are within normal limits. Stable position of right internal jugular Port-A-Cath. Tracheostomy tube is unchanged. Right lung is clear. Stable left perihilar opacity is noted most likely representing post treatment change as noted on prior CT scan. The visualized skeletal structures are unremarkable. IMPRESSION: Stable chronic findings as described above. No significant change compared to prior exam. Electronically Signed   By: Marijo Conception M.D.   On: 09/01/2020 17:49   NCV with EMG(electromyography)  Result Date: 09/15/2020 Alda Berthold, DO     09/15/2020 12:55 PM [facility] [place], [address]  [place], [place] [address] Tel: [phone] Fax:  [phone] Test Date:  09/15/2020 Patient: Shaun Kennedy DOB: 1941/01/02 Physician: Narda Amber, DO Sex: Male Height: _1  Ref Phys: Elsie Saas, MD ID#: [id number]   Technician:  Patient Complaints: This is a 80 year old man referred for evaluation of bilateral hand paresthesias and left hand weakness. NCV & EMG Findings: Extensive electrodiagnostic testing of the left upper extremity and additional studies of the right shows: Left median sensory response shows prolonged latency (4.7 ms) and reduced amplitude (7.4 V).  Left ulnar sensory response is absent.  Right median sensory and the right ulnar sensory nerves shows prolonged distal peak latency (R4.1, R3.3 ms).  Bilateral radial sensory responses are within normal limits. Left median motor response shows prolonged latency (5.5 ms).  Left ulnar motor response shows severely reduced amplitude, prolonged latency (4.3 ms), and decreased conduction velocity across the elbow (A  Elbow-B Elbow, 23 m/s).  Right ulnar motor response shows slowed conduction velocity across the elbow (A Elbow-B Elbow, 42 m/s).  Severe chronic motor axonal loss changes are seen affecting the ulnar innervated muscles on the left, without accompanying active denervation.  These findings are not present in the right upper extremity. Impression: Left ulnar neuropathy with slowing across the elbow, with demyelinating and axonal features, very severe. Left median neuropathy at or distal to the wrist (moderate), consistent with a clinical  diagnosis of carpal tunnel syndrome.  Right ulnar neuropathy with slowing across the elbow, purely demyelinating, mild. Right median neuropathy at or distal to the wrist (mild), consistent with a clinical diagnosis of carpal tunnel syndrome.  ___________________________ Narda Amber, DO Nerve Conduction Studies Anti Sensory Summary Table  Stim Site NR Peak (ms) Norm Peak (ms) P-T Amp (V) Norm P-T Amp Left Median Anti Sensory (2nd Digit)  32C Wrist    4.7 <3.8 7.4 >10 Right Median Anti Sensory (2nd Digit)  32C Wrist    4.1 <3.8 11.1 >10 Left Radial Anti Sensory (Base 1st Digit)  32C Wrist    2.7 <2.8 10.4 >10 Right Radial Anti Sensory (Base 1st Digit)  32C Wrist    2.4 <2.8 13.0 >10 Left Ulnar Anti Sensory (5th Digit)  32C Wrist NR  <3.2  >5 Right Ulnar Anti Sensory (5th Digit)  32C Wrist    3.3 <3.2 11.5 >5 Motor Summary Table  Stim Site NR Onset (ms) Norm Onset (ms) O-P Amp (mV) Norm O-P Amp Site1 Site2 Delta-0 (ms) Dist (cm) Vel (m/s) Norm Vel (m/s) Left Median Motor (Abd Poll Brev)  32C Wrist    5.5 <4.0 8.0 >5 Elbow Wrist 5.3 30.0 57 >50 Elbow    10.8  7.1        Right Median Motor (Abd Poll Brev)  32C Wrist    3.7 <4.0 10.5 >5 Elbow Wrist 5.8 29.0 50 >50 Elbow    9.5  9.3        Left Ulnar Motor (Abd Dig Minimi)  32C Wrist    4.3 <3.1 0.7 >7 B Elbow Wrist 4.4 23.0 52 >50 B Elbow    8.7  0.7  A Elbow B Elbow 4.3 10.0 23 >50 A Elbow    13.0  0.6        Right Ulnar Motor  (Abd Dig Minimi)  32C Wrist    2.6 <3.1 9.0 >7 B Elbow Wrist 4.4 23.0 52 >50 B Elbow    7.0  8.3  A Elbow B Elbow 2.4 10.0 42 >50 A Elbow    9.4  7.8        EMG  Side Muscle Ins Act Fibs Psw Fasc Number Recrt Dur Dur. Amp Amp. Poly Poly. Comment Right 1stDorInt _0  _1  Nml Nml N/A Right Abd Poll Brev _2  _3  Nml Nml N/A Right PronatorTeres _4  _5  Nml Nml N/A Right Biceps _6  _7  Nml Nml N/A Right Triceps _8  _9  Nml Nml N/A Right Deltoid _10  _11  Nml Nml N/A Right FlexCarpiUln _12  _13  Nml Nml N/A Left 1stDorInt Nml Nml Nml Nml SMU Rapid All 1+ All 1+ All 1+ N/A Left Abd Poll Brev _14  _15  Nml Nml N/A Left PronatorTeres _16  _17  Nml Nml N/A Left Biceps _18  _19  Nml Nml N/A Left Triceps _20  _21  Nml Nml N/A Left Deltoid _22  _23  Nml Nml N/A Left ABD Dig Min Nml Nml Nml Nml SMU Rapid All 1+  All 1+ All 1+ N/A Left FlexCarpiUln Nml Nml Nml Nml 3- Rapid Most 1+ Most 1+ Most 1+ N/A Waveforms:              ASSESSMENT AND PLAN: This is a very pleasant 80 years old [name] with stage IV non-small cell lung cancer, squamous cell carcinoma with negative PD-L1 expression. The patient is currently undergoing systemic chemotherapy with carboplatin and paclitaxel for 2 cycles in addition to immunotherapy with ipilimumab 1 mg/KG every 6 weeks and nivolumab 360 mg IV every 3 weeks status post 9 cycles. The patient continues to tolerate this treatment well with no concerning adverse effects. I recommended for him to proceed with day 1 of cycle #10 today as planned. I will see the patient back for follow-up visit in 3 weeks for evaluation  before starting day #22 of cycle #10. I will consider him for imaging studies by the end of cycle #10. The patient was advised to call immediately if he has any other concerning symptoms in the interval. The patient voices understanding of current disease status and treatment options and is in agreement with the current care plan.  All questions were answered. The patient knows to call the clinic with any problems, questions or concerns. We can certainly see the patient much sooner if necessary.  Disclaimer: This note was dictated with voice recognition software. Similar sounding words can inadvertently be transcribed and may not be corrected upon review.

## 2020-10-01 DIAGNOSIS — G5603 Carpal tunnel syndrome, bilateral upper limbs: Secondary | ICD-10-CM | POA: Diagnosis not present

## 2020-10-01 DIAGNOSIS — G5622 Lesion of ulnar nerve, left upper limb: Secondary | ICD-10-CM | POA: Diagnosis not present

## 2020-10-06 DIAGNOSIS — C3432 Malignant neoplasm of lower lobe, left bronchus or lung: Secondary | ICD-10-CM | POA: Diagnosis not present

## 2020-10-06 DIAGNOSIS — J386 Stenosis of larynx: Secondary | ICD-10-CM | POA: Diagnosis not present

## 2020-10-06 DIAGNOSIS — Z930 Tracheostomy status: Secondary | ICD-10-CM | POA: Diagnosis not present

## 2020-10-16 DIAGNOSIS — J386 Stenosis of larynx: Secondary | ICD-10-CM | POA: Diagnosis not present

## 2020-10-16 DIAGNOSIS — Z930 Tracheostomy status: Secondary | ICD-10-CM | POA: Diagnosis not present

## 2020-10-16 DIAGNOSIS — J383 Other diseases of vocal cords: Secondary | ICD-10-CM | POA: Diagnosis not present

## 2020-10-16 DIAGNOSIS — C3432 Malignant neoplasm of lower lobe, left bronchus or lung: Secondary | ICD-10-CM | POA: Diagnosis not present

## 2020-10-21 ENCOUNTER — Inpatient Hospital Stay: Payer: Medicare HMO

## 2020-10-21 ENCOUNTER — Inpatient Hospital Stay: Payer: Medicare HMO | Attending: Internal Medicine | Admitting: Internal Medicine

## 2020-10-21 ENCOUNTER — Other Ambulatory Visit: Payer: Self-pay

## 2020-10-21 ENCOUNTER — Encounter: Payer: Self-pay | Admitting: Internal Medicine

## 2020-10-21 VITALS — BP 138/70 | HR 75 | Temp 97.4°F | Resp 20 | Ht 67.0 in | Wt 180.5 lb

## 2020-10-21 DIAGNOSIS — Z87891 Personal history of nicotine dependence: Secondary | ICD-10-CM | POA: Diagnosis not present

## 2020-10-21 DIAGNOSIS — Z5112 Encounter for antineoplastic immunotherapy: Secondary | ICD-10-CM | POA: Diagnosis not present

## 2020-10-21 DIAGNOSIS — I10 Essential (primary) hypertension: Secondary | ICD-10-CM | POA: Insufficient documentation

## 2020-10-21 DIAGNOSIS — Z7982 Long term (current) use of aspirin: Secondary | ICD-10-CM | POA: Insufficient documentation

## 2020-10-21 DIAGNOSIS — E785 Hyperlipidemia, unspecified: Secondary | ICD-10-CM | POA: Insufficient documentation

## 2020-10-21 DIAGNOSIS — Z7984 Long term (current) use of oral hypoglycemic drugs: Secondary | ICD-10-CM | POA: Diagnosis not present

## 2020-10-21 DIAGNOSIS — J45909 Unspecified asthma, uncomplicated: Secondary | ICD-10-CM | POA: Diagnosis not present

## 2020-10-21 DIAGNOSIS — C3432 Malignant neoplasm of lower lobe, left bronchus or lung: Secondary | ICD-10-CM

## 2020-10-21 DIAGNOSIS — Z79899 Other long term (current) drug therapy: Secondary | ICD-10-CM | POA: Insufficient documentation

## 2020-10-21 DIAGNOSIS — Z95828 Presence of other vascular implants and grafts: Secondary | ICD-10-CM

## 2020-10-21 DIAGNOSIS — Z85038 Personal history of other malignant neoplasm of large intestine: Secondary | ICD-10-CM | POA: Insufficient documentation

## 2020-10-21 DIAGNOSIS — E119 Type 2 diabetes mellitus without complications: Secondary | ICD-10-CM | POA: Diagnosis not present

## 2020-10-21 DIAGNOSIS — C3490 Malignant neoplasm of unspecified part of unspecified bronchus or lung: Secondary | ICD-10-CM

## 2020-10-21 LAB — CMP (CANCER CENTER ONLY)
ALT: 13 U/L (ref 0–44)
AST: 17 U/L (ref 15–41)
Albumin: 3.4 g/dL — ABNORMAL LOW (ref 3.5–5.0)
Alkaline Phosphatase: 56 U/L (ref 38–126)
Anion gap: 8 (ref 5–15)
BUN: 15 mg/dL (ref 8–23)
CO2: 26 mmol/L (ref 22–32)
Calcium: 9.2 mg/dL (ref 8.9–10.3)
Chloride: 106 mmol/L (ref 98–111)
Creatinine: 1.22 mg/dL (ref 0.61–1.24)
GFR, Estimated: 59 mL/min — ABNORMAL LOW (ref >60)
Glucose, Bld: 133 mg/dL — ABNORMAL HIGH (ref 70–99)
Potassium: 3.8 mmol/L (ref 3.5–5.1)
Sodium: 140 mmol/L (ref 135–145)
Total Bilirubin: 0.6 mg/dL (ref 0.3–1.2)
Total Protein: 7.9 g/dL (ref 6.5–8.1)

## 2020-10-21 LAB — CBC WITH DIFFERENTIAL (CANCER CENTER ONLY)
Abs Immature Granulocytes: 0.02 10*3/uL (ref 0.00–0.07)
Basophils Absolute: 0 10*3/uL (ref 0.0–0.1)
Basophils Relative: 0 %
Eosinophils Absolute: 0.4 10*3/uL (ref 0.0–0.5)
Eosinophils Relative: 7 %
HCT: 30.3 % — ABNORMAL LOW (ref 39.0–52.0)
Hemoglobin: 10.1 g/dL — ABNORMAL LOW (ref 13.0–17.0)
Immature Granulocytes: 0 %
Lymphocytes Relative: 19 %
Lymphs Abs: 1.1 10*3/uL (ref 0.7–4.0)
MCH: 31.1 pg (ref 26.0–34.0)
MCHC: 33.3 g/dL (ref 30.0–36.0)
MCV: 93.2 fL (ref 80.0–100.0)
Monocytes Absolute: 0.5 10*3/uL (ref 0.1–1.0)
Monocytes Relative: 9 %
Neutro Abs: 4 10*3/uL (ref 1.7–7.7)
Neutrophils Relative %: 65 %
Platelet Count: 268 10*3/uL (ref 150–400)
RBC: 3.25 MIL/uL — ABNORMAL LOW (ref 4.22–5.81)
RDW: 13 % (ref 11.5–15.5)
WBC Count: 6.1 10*3/uL (ref 4.0–10.5)
nRBC: 0 % (ref 0.0–0.2)

## 2020-10-21 LAB — TSH: TSH: 0.885 u[IU]/mL (ref 0.320–4.118)

## 2020-10-21 MED ORDER — HEPARIN SOD (PORK) LOCK FLUSH 100 UNIT/ML IV SOLN
500.0000 [IU] | Freq: Once | INTRAVENOUS | Status: AC | PRN
  Administered 2020-10-21: 500 [IU]

## 2020-10-21 MED ORDER — SODIUM CHLORIDE 0.9 % IV SOLN: Freq: Once | INTRAVENOUS | Status: AC

## 2020-10-21 MED ORDER — SODIUM CHLORIDE 0.9 % IV SOLN
360.0000 mg | Freq: Once | INTRAVENOUS | Status: AC
  Administered 2020-10-21: 360 mg via INTRAVENOUS
  Filled 2020-10-21: qty 24

## 2020-10-21 MED ORDER — SODIUM CHLORIDE 0.9% FLUSH
10.0000 mL | INTRAVENOUS | Status: DC | PRN
  Administered 2020-10-21: 10 mL

## 2020-10-21 MED ORDER — SODIUM CHLORIDE 0.9% FLUSH
10.0000 mL | Freq: Once | INTRAVENOUS | Status: AC
  Administered 2020-10-21: 10 mL

## 2020-10-21 NOTE — Patient Instructions (Signed)
[facility]COL[facility]  Discharge Instructions: Thank you for choosing [facility] to provide your oncology and hematology care.   If you have a lab appointment with the [facility], please go directly to the [facility] and check in at the registration area.   Wear comfortable clothing and clothing appropriate for easy access to any Portacath or PICC line.   We strive to give you quality time with your provider. You may need to reschedule your appointment if you arrive late (15 or more minutes).  Arriving late affects you and other patients whose appointments are after yours.  Also, if you miss three or more appointments without notifying the office, you may be dismissed from the clinic at the provider's discretion.      For prescription refill requests, have your pharmacy contact our office and allow 72 hours for refills to be completed.    Today you received the following chemotherapy and/or immunotherapy agents opdivo      To help prevent nausea and vomiting after your treatment, we encourage you to take your nausea medication as directed.  BELOW ARE SYMPTOMS THAT SHOULD BE REPORTED IMMEDIATELY: *FEVER GREATER THAN 100.4 F (38 C) OR HIGHER *CHILLS OR SWEATING *NAUSEA AND VOMITING THAT IS NOT CONTROLLED WITH YOUR NAUSEA MEDICATION *UNUSUAL SHORTNESS OF BREATH *UNUSUAL BRUISING OR BLEEDING *URINARY PROBLEMS (pain or burning when urinating, or frequent urination) *BOWEL PROBLEMS (unusual diarrhea, constipation, pain near the anus) TENDERNESS IN MOUTH AND THROAT WITH OR WITHOUT PRESENCE OF ULCERS (sore throat, sores in mouth, or a toothache) UNUSUAL RASH, SWELLING OR PAIN  UNUSUAL VAGINAL DISCHARGE OR ITCHING   Items with * indicate a potential emergency and should be followed up as soon as possible or go to the Emergency Department if any problems should occur.  Please show the CHEMOTHERAPY ALERT CARD or IMMUNOTHERAPY ALERT CARD at check-in to the  Emergency Department and triage nurse.  Should you have questions after your visit or need to cancel or reschedule your appointment, please contact [facility]  Dept: [phone]  and follow the prompts.  Office hours are 8:00 a.m. to 4:30 p.m. Monday - Friday. Please note that voicemails left after 4:00 p.m. may not be returned until the following business day.  We are closed weekends and major holidays. You have access to a nurse at all times for urgent questions. Please call the main number to the clinic Dept: [phone] and follow the prompts.   For any non-urgent questions, you may also contact your provider using MyChart. We now offer e-Visits for anyone 33 and older to request care online for non-urgent symptoms. For details visit [email or web address].   Also download the MyChart app! Go to the app store, search "MyChart", open the app, select [facility], and log in with your MyChart username and password.  Due to Covid, a mask is required upon entering the hospital/clinic. If you do not have a mask, one will be given to you upon arrival. For doctor visits, patients may have 1 support person aged 39 or older with them. For treatment visits, patients cannot have anyone with them due to current Covid guidelines and our immunocompromised population.

## 2020-10-21 NOTE — Progress Notes (Signed)
[facility] [phone]   [phone]  OFFICE PROGRESS NOTE  [name], [place] [address]  DIAGNOSIS: stage IV (T3, N2, M1 a) non-small cell lung cancer, squamous cell carcinoma presented with obstructive left lower lobe lung mass in addition to mediastinal lymphadenopathy as well as bilateral pulmonary nodules diagnosed in July 2021.   PDL1: 0%   PRIOR THERAPY: None   CURRENT THERAPY: 1) Palliative radiotherapy to the obstructive left lower lobe lung mass under the care of Dr. Lisbeth Renshaw.  2)  systemic chemotherapy 2 cycles of chemotherapy with carboplatin for an AUC of 5 and paclitaxel 175 mg/m2 in addition to immunotherapy with nivolumab 360 mg every 3 weeks and ipilimumab 1 mg/kg IV every 6 weeks followed by maintenance nivolumab and ipilimumab.  He started the first treatment on 09/04/2019.  He is status post 9 cycles of the treatment.  INTERVAL HISTORY: Shaun Kennedy 80 y.o. male returns to the clinic today for follow-up visit.  The patient is feeling fine with no concerning complaints except for trouble with the tracheostomy and he is scheduled to see ENT for further evaluation and management. He denied having any chest pain, shortness of breath, cough or hemoptysis.  He denied having any fever or chills.  He has no nausea, vomiting, diarrhea or constipation.  He has no headache or visual changes.  He has no recent weight loss or night sweats.  He continues to tolerate his treatment with maintenance nivolumab and ipilimumab fairly well.  The patient is here today for evaluation before starting day #22 of cycle #9.  MEDICAL HISTORY: Past Medical History:  Diagnosis Date   Allergic rhinitis    Asthma    Carotid stenosis    Colon cancer ([facility]) 2003   Diabetes mellitus ([facility])    Diverticulosis    Dyslipidemia    ED (erectile dysfunction)    GERD (gastroesophageal reflux disease)    H/O degenerative disc disease     Hemorrhoids    HTN (hypertension)    Hyperlipidemia    Lung cancer ([facility])    LVH (left ventricular hypertrophy)    on EKG   Mass of lower lobe of left lung    Mediastinal adenopathy    Smoker    former   Wears dentures    Wears dentures    Wears glasses    Wears glasses     ALLERGIES:  has No Known Allergies.  MEDICATIONS:  Current Outpatient Medications  Medication Sig Dispense Refill   acetaminophen (TYLENOL) 500 MG tablet Take 1,000 mg by mouth every 4 (four) hours as needed. (Patient not taking: No sig reported)     Alcohol Swabs (DROPSAFE ALCOHOL PREP) 70 % PADS Apply topically.     amLODipine (NORVASC) 5 MG tablet Take 1 tablet (5 mg total) by mouth daily. 90 tablet 3   ANORO ELLIPTA 62.5-25 MCG/INH AEPB Inhale 1 puff into the lungs daily. (Patient not taking: No sig reported)     aspirin EC 81 MG tablet Take 81 mg by mouth daily after breakfast.      Blood Glucose Calibration (TRUE METRIX LEVEL 1) Low SOLN      Blood Glucose Monitoring Suppl (ONETOUCH VERIO) w/Device KIT USE TO CHECK SUGAR DAILY 1 kit 0   dronabinol (MARINOL) 2.5 MG capsule Take 1 capsule (2.5 mg total) by mouth 2 (two) times daily before a meal. 60 capsule 1   ferrous sulfate 325 (65 FE)  MG EC tablet Take 1 tablet (325 mg total) by mouth daily with breakfast. 30 tablet 12   HYDROcodone-acetaminophen (NORCO/VICODIN) 5-325 MG tablet Take 1 tablet by mouth every 4 (four) hours as needed. 10 tablet 0   Lancet Devices (TRUEDRAW LANCING DEVICE) MISC Check sugars daily 90 each 0   Lancets (ONETOUCH DELICA PLUS [id number]) MISC PATIENT IS TO TEST TWO TIMES A DAY DX: E11.9 (ONETOUCH VERIO FLEX) 100 each 12   lisinopril-hydrochlorothiazide (ZESTORETIC) 20-12.5 MG tablet Take 1 tablet by mouth daily.     metFORMIN (GLUCOPHAGE-XR) 500 MG 24 hr tablet TAKE 1 TABLET EVERY DAY 90 tablet 1   Multiple Vitamin (MULTIVITAMIN WITH MINERALS) TABS tablet Take 1 tablet by mouth daily after breakfast.      senna-docusate  (SENOKOT-S) 8.6-50 MG tablet Take 2 tablets by mouth 2 (two) times daily. (Patient not taking: No sig reported) 30 tablet 0   simvastatin (ZOCOR) 40 MG tablet Take 40 mg by mouth daily.     solifenacin (VESICARE) 5 MG tablet TAKE 1 TABLET EVERY DAY 90 tablet 1   TRUE METRIX BLOOD GLUCOSE TEST test strip SMARTSIG:Via Meter     No current facility-administered medications for this visit.   Facility-Administered Medications Ordered in Other Visits  Medication Dose Route Frequency Provider Last Rate Last Admin   sodium chloride flush (NS) 0.9 % injection 10 mL  10 mL Intracatheter PRN Curt Bears, MD   10 mL at 09/04/19 1556    SURGICAL HISTORY:  Past Surgical History:  Procedure Laterality Date   BRONCHIAL BIOPSY  08/20/2019   Procedure: BRONCHIAL BIOPSIES;  Surgeon: Collene Gobble, MD;  Location[facility] ENDOSCOPY;  Service: Pulmonary;;   BRONCHIAL BRUSHINGS  08/20/2019   Procedure: BRONCHIAL BRUSHINGS;  Surgeon: Collene Gobble, MD;  Location: [facility] ENDOSCOPY;  Service: Pulmonary;;   BRONCHIAL NEEDLE ASPIRATION BIOPSY  08/20/2019   Procedure: BRONCHIAL NEEDLE ASPIRATION BIOPSIES;  Surgeon: Collene Gobble, MD;  Location: [place];  Service: Pulmonary;;   CARPAL TUNNEL RELEASE Right 01/09/2019   Procedure: CARPAL TUNNEL RELEASE;  Surgeon: Leandrew Koyanagi, MD;  Location: [place];  Service: Orthopedics;  Laterality: Right;   CATARACT EXTRACTION Right 2018   COLONOSCOPY  2007   Dr. Benson Norway   DIRECT LARYNGOSCOPY N/A 04/10/2020   Procedure: DIRECT LARYNGOSCOPY;  Surgeon: Melida Quitter, MD;  Location: [place];  Service: ENT;  Laterality: N/A;   I & D EXTREMITY Right 04/02/2016   Procedure: IRRIGATION AND DEBRIDEMENT GREAT TOE;  Surgeon: Leandrew Koyanagi, MD;  Location: [place];  Service: Orthopedics;  Laterality: Right;   IR IMAGING GUIDED PORT INSERTION  08/30/2019   MULTIPLE TOOTH EXTRACTIONS     RADIOACTIVE SEED IMPLANT  2003   TRACHEOSTOMY TUBE PLACEMENT N/A 04/10/2020   Procedure:  TRACHEOSTOMY;  Surgeon: Melida Quitter, MD;  Location: [place];  Service: ENT;  Laterality: N/A;   ULNAR TUNNEL RELEASE Right 01/09/2019   Procedure: RIGHT CUBITAL TUNNEL RELEASE AND CARPAL TUNNEL RELEASE;  Surgeon: Leandrew Koyanagi, MD;  Location: [place];  Service: Orthopedics;  Laterality: Right;   UPPER GASTROINTESTINAL ENDOSCOPY     VIDEO BRONCHOSCOPY N/A 12/18/2019   Procedure: VIDEO BRONCHOSCOPY WITHOUT FLUORO;  Surgeon: Collene Gobble, MD;  Location: [facility] ENDOSCOPY;  Service: Cardiopulmonary;  Laterality: N/A;   VIDEO BRONCHOSCOPY WITH ENDOBRONCHIAL ULTRASOUND N/A 08/20/2019   Procedure: VIDEO BRONCHOSCOPY WITH ENDOBRONCHIAL ULTRASOUND;  Surgeon: Collene Gobble, MD;  Location: [facility] ENDOSCOPY;  Service: Pulmonary;  Laterality: N/A;  REVIEW OF SYSTEMS:  A comprehensive review of systems was negative except for: Respiratory: positive for dyspnea on exertion   PHYSICAL EXAMINATION: General appearance: alert, cooperative, and no distress Head: Normocephalic, without obvious abnormality, atraumatic Neck: no adenopathy, no JVD, supple, symmetrical, trachea midline, and thyroid not enlarged, symmetric, no tenderness/mass/nodules Lymph nodes: Cervical, supraclavicular, and axillary nodes normal. Resp: clear to auscultation bilaterally Back: symmetric, no curvature. ROM normal. No CVA tenderness. Cardio: regular rate and rhythm, S1, S2 normal, no murmur, click, rub or gallop GI: soft, non-tender; bowel sounds normal; no masses,  no organomegaly Extremities: extremities normal, atraumatic, no cyanosis or edema  ECOG PERFORMANCE STATUS: 1 - Symptomatic but completely ambulatory  Blood pressure 138/70, pulse 75, temperature (!) 97.4 F (36.3 C), temperature source Tympanic, resp. rate 20, height _0  (1.702 m), weight 180 lb 8 oz (81.9 kg), SpO2 100 %.  LABORATORY DATA: Lab Results  Component Value Date   WBC 5.9 09/30/2020   HGB 10.1 (L) 09/30/2020   HCT 30.7 (L) 09/30/2020    MCV 93.6 09/30/2020   PLT 260 09/30/2020      Chemistry      Component Value Date/Time   NA 139 09/30/2020 1332   NA 132 (L) 08/14/2019 0918   K 3.9 09/30/2020 1332   CL 106 09/30/2020 1332   CO2 26 09/30/2020 1332   BUN 20 09/30/2020 1332   BUN 16 08/14/2019 0918   CREATININE 1.47 (H) 09/30/2020 1332   CREATININE 1.25 (H) 08/08/2016 0912      Component Value Date/Time   CALCIUM 9.3 09/30/2020 1332   ALKPHOS 52 09/30/2020 1332   AST 16 09/30/2020 1332   ALT 12 09/30/2020 1332   BILITOT 0.8 09/30/2020 1332       RADIOGRAPHIC STUDIES: No results found.  ASSESSMENT AND PLAN: This is a very pleasant 80 years old [name] with stage IV non-small cell lung cancer, squamous cell carcinoma with negative PD-L1 expression. The patient is currently undergoing systemic chemotherapy with carboplatin and paclitaxel for 2 cycles in addition to immunotherapy with ipilimumab 1 mg/KG every 6 weeks and nivolumab 360 mg IV every 3 weeks status post 9 cycles. He has been tolerating this treatment well with no concerning adverse effects. He is here today for evaluation before starting day #22 of cycle #9. I recommended for the patient to continue with his treatment as planned. I will see him back for follow-up visit in 3 weeks for evaluation with repeat CT scan of the chest, abdomen pelvis for restaging of his disease. The patient was advised to call immediately if he has any other concerning symptoms in the interval. The patient voices understanding of current disease status and treatment options and is in agreement with the current care plan.  All questions were answered. The patient knows to call the clinic with any problems, questions or concerns. We can certainly see the patient much sooner if necessary.  Disclaimer: This note was dictated with voice recognition software. Similar sounding words can inadvertently be transcribed and may not be corrected upon review.

## 2020-10-22 ENCOUNTER — Telehealth: Payer: Self-pay | Admitting: Internal Medicine

## 2020-10-22 NOTE — Telephone Encounter (Signed)
Scheduled appt per 9/14 los- patient to get an updated schedule next visit.

## 2020-10-23 ENCOUNTER — Other Ambulatory Visit: Payer: Self-pay | Admitting: Orthopedic Surgery

## 2020-10-26 ENCOUNTER — Other Ambulatory Visit: Payer: Self-pay

## 2020-10-26 NOTE — Patient Outreach (Signed)
[facility]) Care Management  10/26/2020  HYDE SIRES [birth year] [phone]   Telephone Assessment   Successful outreach call placed and spoke with spouse. She denies any acute issues or changes in condition. She reports patient has been doing fairly well. Spouse reports patient will be having upcoming surgery for possible carpal tunnel.They are awaiting further info regarding it. No RN CM need or concerns at this time.    Medications Reviewed Today     Reviewed by [name], RN (Registered Nurse) on 10/26/20 at [phone]  Med List Status: <None>   Medication Order Taking? Sig Documenting Provider Last Dose Status Informant  acetaminophen (TYLENOL) 500 MG tablet [id number] No Take 1,000 mg by mouth every 4 (four) hours as needed.  Patient not taking: No sig reported   [provider] Not Taking Active   Alcohol Swabs (DROPSAFE ALCOHOL PREP) 70 % PADS [id number]  Apply topically. [provider]  Active   amLODipine (NORVASC) 5 MG tablet [id number] No Take 1 tablet (5 mg total) by mouth daily. [name], MD Taking Active Spouse/Significant Other  ANORO ELLIPTA 62.5-25 MCG/INH AEPB [id number] No Inhale 1 puff into the lungs daily.  Patient not taking: No sig reported   [provider] Not Taking Active   aspirin EC 81 MG tablet [id number] No Take 81 mg by mouth daily after breakfast.  [provider] Taking Active Spouse/Significant Other  Blood Glucose Calibration (TRUE METRIX LEVEL 1) Low SOLN [id number]   [provider]  Active   Blood Glucose Monitoring Suppl [facility] VERIO) w/Device KIT [id number] No USE TO CHECK SUGAR DAILY [name], MD Taking Active Spouse/Significant Other  dronabinol (MARINOL) 2.5 MG capsule [id number] No Take 1 capsule (2.5 mg total) by mouth 2 (two) times daily before a meal. [name], MD Taking Active   ferrous sulfate 325 (65 FE) MG EC tablet [id number] No Take 1  tablet (325 mg total) by mouth daily with breakfast. [name], MD Taking Active Spouse/Significant Other  HYDROcodone-acetaminophen (NORCO/VICODIN) 5-325 MG tablet [id number] No Take 1 tablet by mouth every 4 (four) hours as needed. Isla Pence, MD Taking Active   Lancet Devices (TRUEDRAW LANCING DEVICE) [place] [id number]  Check sugars daily [name], MD  Active   Lancets (ONETOUCH DELICA PLUS [id number]) [place] [id number]  PATIENT IS TO TEST TWO TIMES A DAY DX: E11.9 (ONETOUCH VERIO FLEX) [name], MD  Active   lisinopril-hydrochlorothiazide (ZESTORETIC) 20-12.5 MG tablet [id number]  Take 1 tablet by mouth daily. [provider]  Active   metFORMIN (GLUCOPHAGE-XR) 500 MG 24 hr tablet [id number] No TAKE 1 TABLET EVERY DAY [name], MD Taking Active   Multiple Vitamin (MULTIVITAMIN WITH MINERALS) TABS tablet [id number] No Take 1 tablet by mouth daily after breakfast.  [provider] Taking Active Spouse/Significant Other  senna-docusate (SENOKOT-S) 8.6-50 MG tablet [id number] No Take 2 tablets by mouth 2 (two) times daily.  Patient not taking: No sig reported   Nita Sells, MD Not Taking Active   simvastatin (ZOCOR) 40 MG tablet [id number] No Take 40 mg by mouth daily. [provider] Unknown Active   solifenacin (VESICARE) 5 MG tablet [id number] No TAKE 1 TABLET EVERY DAY [name], MD Taking Active   TRUE METRIX BLOOD GLUCOSE TEST test strip [id number]  SMARTSIG:Via Meter [provider]  Active                Goals Addressed  This Visit's Progress     (THN)Follow My Treatment Plan-Chemotherapy Adherence (pt-stated)        Timeframe:  Short-Term Goal Priority:  High Start Date:  08/07/2020                           Expected End Date:Dec 2022                       Follow Up Date:Oct 2022   Barriers: Knowledge   - call the doctor or nurse to get help with side effects - keep a list of all the  medicines I take; vitamins and herbals too - keep follow-up appointments    Why is this important?   Following your treatment plan will help keep your care on track.  Medicine may be the most important piece of your plan.  There are many reasons why you might want to stop taking medicine. You may get tired of taking your medicine. You may think medicine costs too much money. You may find the side effects are too much to bear.  Try some of these steps to make following the treatment plan a little easier.     Notes:   08/07/20-Patient tolerating chemo txs well so far at present. Denies any SEs. He is due for upcoming scans to assess status of cancer within the next few months.   09/21/20-Patient tolerating chemo txs fairly well-no reported SEs except appetite not as good as it was-supplementing with shakes. Spouse inquiring about questions if txs "working and helping" and encouraged her to discuss with oncologist.   10/26/20-Spouse reports that pt continues to tolerate chemo txs well-denies any SEs at present-he is on cycle 9. He is du to have some tests/scan done within the next month.       COMPLETED: (THN)Optimal Respiratory Status (pt-stated)          Timeframe:  Short-Term Goal Priority:  High Start Date:      07/08/2020                       Expected End Date:Aug 2022 Follow Up Date: Aug2022   Barriers: Health Behaviors Knowledge                        -pt/family will perform trach care at home -pt/family will monitor for s/s of infection or impaired resp status -pt will f/u with ENT as ordered   Evidence-based guidance:   Anticipate/prepare caregiver for hospitalization with persistent symptoms including increased respiratory effort, tachypnea, hypoxemia, apnea, feeding intolerance; consider presence of severe disease risk factors.  Encourage caregiver to clear nasal congestion (especially before feeding) based on work of breathing.  Use techniques, such as superficial nasal  suction, elevating head of bed when able, making frequent position changes, decreasing stimulation, clustering care to conserve energy and increase oxygenation, promoting rest.   If high-risk, anticipate/prepare child and caregiver for administration of palivizumab administered intramuscularly once a month through respiratory syncytial virus (RSV) season to a maximum of 5 doses.  Encourage caregiver tobacco cessation; support avoidance of second-hand smoke exposure.  Promote infection prevention techniques: caregiver handwashing, avoiding contact with those with respiratory illnesses and/or second-hand smoke exposure and continuing breastfeeding until at least 51 months of age.  Provide anticipatory guidance to parent/caregiver regarding awareness of worsening signs/symptoms and when to call provider; acknowledge fear and provide support and reassurance.  Notes:   07/08/2020-Patient with complaints of phlegm/mucus despite performing freq trach care throughout the day. He is going to be evaluated by MD today. Denies any s/s of resp infection.   08/07/20-Patient report trach intact and no recent issues. He saw ENT a few wks ago. He continue to have mucus production-MD aware and advised normal. No s/s of infection. SOB managed at present.   09/21/2020-Spouse reports no SOB or resp issues. Patient with chronic cough related to trach. Lurline Idol has been functional and no recent issues.   10/26/20-Patient has had no further issues with trach. Resp status WNL for pt.       (THN)Set My Target A1C-Diabetes Type 2 (pt-stated)        Timeframe:  Long-Range Goal Priority:  Medium Start Date: 04/27/2020                            Expected End Date:  Dec 2022                     Follow Up Date Oct 2022    Barriers: Health Behaviors Knowledge   - set target A1C  -monitor cbgs as ordered -complete MD appts as ordered   Why is this important?   Your target A1C is decided together by you and your doctor.   It is based on several things like your age and other health issues.    Notes:  04/27/2020-Patient went for PCP appt today and had A1C level drawn-results pending. Last A1C on file 5.9(Jan 2022).  05/12/2020-Spouse reports appetite gradually increasing-blood sugars WNL. A1C lab result was 6.2(04/27/20)  06/08/20-Caregivers report pt continues to maintain good appetite. Blood sugars have ben in the low to mid 100s.  07/08/2020-Patient reports appetite remains good and WNL for him. Wgt stable. Family monitoring cbgs but unsure of reading this morning-unable to recall. No current labwork on file.   08/07/20-Patient saw PCP recently and had labwork done. A1C 6.0 down from 6.5. Pt/family continues to monitor cbgs in the home. He voices adherence to diabetic diet.   09/21/20-Spouse reports appetite fair. Patient supplementing by drinking about to Ensures per day. Wgt stable at 175lbs. Blood sugars controlled.   10/26/20-Appetite reported as good. Patient out of strips for meter and has to go to pharmacy to pick it up so he has not checked cbgs in last few days.          Plan: RN CM discussed with caregiver next outreach within the month of Oct. Caregiver agrees to care plan and follow up. Caregiver gave verbal consent and in agreement with RN CM follow up and timeframe. Caregiver aware that they may contact RN CM sooner for any issues or concerns. RN CM reviewed goals and plan of care with caregiver.   [name], RN,BSN,CCM [facility] [facility] Telephonic Care Management Coordinator Direct Phone: [phone] Toll Free: [phone] Fax: [phone]

## 2020-10-27 ENCOUNTER — Other Ambulatory Visit: Payer: Self-pay

## 2020-10-27 MED ORDER — TRUEPLUS LANCETS 33G MISC: 1.0000 | Freq: Two times a day (BID) | 4 refills | Status: DC

## 2020-10-28 ENCOUNTER — Ambulatory Visit: Payer: Self-pay | Admitting: Family Medicine

## 2020-11-06 ENCOUNTER — Other Ambulatory Visit: Payer: Self-pay | Admitting: Orthopedic Surgery

## 2020-11-09 ENCOUNTER — Ambulatory Visit (HOSPITAL_COMMUNITY)
Admission: RE | Admit: 2020-11-09 | Discharge: 2020-11-09 | Disposition: A | Discharge status: Home / Self Care | Payer: Medicare HMO | Source: Ambulatory Visit | Attending: Acute Care | Admitting: Acute Care

## 2020-11-09 ENCOUNTER — Other Ambulatory Visit: Payer: Self-pay

## 2020-11-09 DIAGNOSIS — Z87891 Personal history of nicotine dependence: Secondary | ICD-10-CM | POA: Diagnosis not present

## 2020-11-09 DIAGNOSIS — J386 Stenosis of larynx: Secondary | ICD-10-CM | POA: Diagnosis not present

## 2020-11-09 DIAGNOSIS — Z430 Encounter for attention to tracheostomy: Secondary | ICD-10-CM

## 2020-11-09 DIAGNOSIS — Z930 Tracheostomy status: Secondary | ICD-10-CM | POA: Diagnosis not present

## 2020-11-09 NOTE — Progress Notes (Signed)
Tracheostomy Procedure Note  Shaun Kennedy [id number] 01-30-1941  Pre Procedure Tracheostomy Information  Trach Brand: Shiley Flex [id number] Size:  6.0 Style: Uncuffed Secured by: Velcro   Procedure: Trach Cleaning and Trach Change    Post Procedure Tracheostomy Information  Trach Brand: Shiley Size:  6.0 Style: Uncuffed Secured by: Velcro   Post Procedure Evaluation:  ETCO2 positive color change from yellow to purple : Yes.   Vital signs:VSS HR 80 Oxygen saturation 98% on RA Patients current condition: stable Complications: No apparent complications Trach site exam: clean, dry Wound care done: 4 x 4 gauze Patient did tolerate procedure well.   Education: NONE  Prescription needs: NONE    Additional needs: New PMV given to patient at this visit

## 2020-11-10 ENCOUNTER — Other Ambulatory Visit: Payer: Self-pay | Admitting: Internal Medicine

## 2020-11-10 ENCOUNTER — Ambulatory Visit (HOSPITAL_COMMUNITY): Payer: Medicare HMO

## 2020-11-10 DIAGNOSIS — C3432 Malignant neoplasm of lower lobe, left bronchus or lung: Secondary | ICD-10-CM

## 2020-11-10 LAB — SARS CORONAVIRUS 2 (TAT 6-24 HRS): SARS Coronavirus 2: NEGATIVE

## 2020-11-11 NOTE — Progress Notes (Signed)
[facility]   Reason for visit:  Trach change  HPI:  80 year old male patient followed in our clinic for asthma , As well as prior left lower lobe lung cancer is got a history of tobacco abuse as well as diabetes type 2.  Most recently back in March 2022 was admitted and diagnosed with subglottic stenosis ultimately requiring tracheostomy by Dr. Redmond Baseman.  He is still followed by Dr. Redmond Baseman with ENT, and I follow him for routine tracheostomy changes ROS  Negative  Vital signs:  Reviewed: Pulse Ox >90 Exam:  no distress ENT exam normal, no neck nodes or sinus tenderness clear to auscultation bilaterally regular rate and rhythm Abdomen soft and nontender without distention, masses , no wound infection noted. extremities normal, atraumatic, no cyanosis or edema Alert and oriented x 3, gait normal., reflexes normal and symmetric, strength and  sensation grossly normal  Trach change/procedure:  trach change Shiley size 6 cuffless Wound appearance: unremarkable  Placement confirmed w/ ETCO2      Impression/dx  Trach dependent  Subglottic stenosis  Discussion  Doing well. No changes  Plan  ROV 12 weeks for change Follow w/ ENT     Visit time: 22 minutes.   Erick Colace ACNP-BC [place]

## 2020-11-12 ENCOUNTER — Inpatient Hospital Stay: Payer: Medicare HMO | Admitting: Internal Medicine

## 2020-11-12 ENCOUNTER — Inpatient Hospital Stay: Payer: Medicare HMO

## 2020-11-12 ENCOUNTER — Inpatient Hospital Stay: Payer: Medicare HMO | Attending: Internal Medicine

## 2020-11-12 ENCOUNTER — Telehealth: Payer: Self-pay | Admitting: Medical Oncology

## 2020-11-12 ENCOUNTER — Inpatient Hospital Stay (HOSPITAL_BASED_OUTPATIENT_CLINIC_OR_DEPARTMENT_OTHER): Payer: Medicare HMO | Admitting: Internal Medicine

## 2020-11-12 ENCOUNTER — Other Ambulatory Visit: Payer: Self-pay

## 2020-11-12 VITALS — BP 106/61 | HR 66 | Temp 97.0°F | Resp 18 | Ht 67.0 in | Wt 180.6 lb

## 2020-11-12 DIAGNOSIS — Z7984 Long term (current) use of oral hypoglycemic drugs: Secondary | ICD-10-CM | POA: Insufficient documentation

## 2020-11-12 DIAGNOSIS — Z95828 Presence of other vascular implants and grafts: Secondary | ICD-10-CM

## 2020-11-12 DIAGNOSIS — E119 Type 2 diabetes mellitus without complications: Secondary | ICD-10-CM | POA: Diagnosis not present

## 2020-11-12 DIAGNOSIS — Z87891 Personal history of nicotine dependence: Secondary | ICD-10-CM | POA: Insufficient documentation

## 2020-11-12 DIAGNOSIS — I10 Essential (primary) hypertension: Secondary | ICD-10-CM | POA: Diagnosis not present

## 2020-11-12 DIAGNOSIS — Z5112 Encounter for antineoplastic immunotherapy: Secondary | ICD-10-CM

## 2020-11-12 DIAGNOSIS — Z79899 Other long term (current) drug therapy: Secondary | ICD-10-CM | POA: Insufficient documentation

## 2020-11-12 DIAGNOSIS — C3432 Malignant neoplasm of lower lobe, left bronchus or lung: Secondary | ICD-10-CM

## 2020-11-12 DIAGNOSIS — Z7982 Long term (current) use of aspirin: Secondary | ICD-10-CM | POA: Diagnosis not present

## 2020-11-12 LAB — CBC WITH DIFFERENTIAL (CANCER CENTER ONLY)
Abs Immature Granulocytes: 0.01 10*3/uL (ref 0.00–0.07)
Basophils Absolute: 0 10*3/uL (ref 0.0–0.1)
Basophils Relative: 1 %
Eosinophils Absolute: 0.3 10*3/uL (ref 0.0–0.5)
Eosinophils Relative: 6 %
HCT: 28.5 % — ABNORMAL LOW (ref 39.0–52.0)
Hemoglobin: 9.8 g/dL — ABNORMAL LOW (ref 13.0–17.0)
Immature Granulocytes: 0 %
Lymphocytes Relative: 29 %
Lymphs Abs: 1.6 10*3/uL (ref 0.7–4.0)
MCH: 31.6 pg (ref 26.0–34.0)
MCHC: 34.4 g/dL (ref 30.0–36.0)
MCV: 91.9 fL (ref 80.0–100.0)
Monocytes Absolute: 0.6 10*3/uL (ref 0.1–1.0)
Monocytes Relative: 10 %
Neutro Abs: 3 10*3/uL (ref 1.7–7.7)
Neutrophils Relative %: 54 %
Platelet Count: 267 10*3/uL (ref 150–400)
RBC: 3.1 MIL/uL — ABNORMAL LOW (ref 4.22–5.81)
RDW: 12.8 % (ref 11.5–15.5)
WBC Count: 5.4 10*3/uL (ref 4.0–10.5)
nRBC: 0 % (ref 0.0–0.2)

## 2020-11-12 LAB — CMP (CANCER CENTER ONLY)
ALT: 13 U/L (ref 0–44)
AST: 18 U/L (ref 15–41)
Albumin: 3.4 g/dL — ABNORMAL LOW (ref 3.5–5.0)
Alkaline Phosphatase: 62 U/L (ref 38–126)
Anion gap: 7 (ref 5–15)
BUN: 20 mg/dL (ref 8–23)
CO2: 27 mmol/L (ref 22–32)
Calcium: 9.2 mg/dL (ref 8.9–10.3)
Chloride: 106 mmol/L (ref 98–111)
Creatinine: 1.37 mg/dL — ABNORMAL HIGH (ref 0.61–1.24)
GFR, Estimated: 52 mL/min — ABNORMAL LOW (ref >60)
Glucose, Bld: 112 mg/dL — ABNORMAL HIGH (ref 70–99)
Potassium: 3.6 mmol/L (ref 3.5–5.1)
Sodium: 140 mmol/L (ref 135–145)
Total Bilirubin: 0.5 mg/dL (ref 0.3–1.2)
Total Protein: 8 g/dL (ref 6.5–8.1)

## 2020-11-12 LAB — TSH: TSH: 1.247 u[IU]/mL (ref 0.320–4.118)

## 2020-11-12 MED ORDER — FAMOTIDINE 20 MG IN NS 100 ML IVPB
20.0000 mg | Freq: Once | INTRAVENOUS | Status: AC
  Administered 2020-11-12: 20 mg via INTRAVENOUS
  Filled 2020-11-12: qty 100

## 2020-11-12 MED ORDER — SODIUM CHLORIDE 0.9 % IV SOLN: Freq: Once | INTRAVENOUS | Status: AC

## 2020-11-12 MED ORDER — SODIUM CHLORIDE 0.9 % IV SOLN
1.0000 mg/kg | Freq: Once | INTRAVENOUS | Status: AC
  Administered 2020-11-12: 80 mg via INTRAVENOUS
  Filled 2020-11-12: qty 16

## 2020-11-12 MED ORDER — SODIUM CHLORIDE 0.9 % IV SOLN
360.0000 mg | Freq: Once | INTRAVENOUS | Status: AC
  Administered 2020-11-12: 360 mg via INTRAVENOUS
  Filled 2020-11-12: qty 12

## 2020-11-12 MED ORDER — DIPHENHYDRAMINE HCL 50 MG/ML IJ SOLN
25.0000 mg | Freq: Once | INTRAMUSCULAR | Status: AC
  Administered 2020-11-12: 25 mg via INTRAVENOUS
  Filled 2020-11-12: qty 1

## 2020-11-12 MED ORDER — SODIUM CHLORIDE 0.9% FLUSH
10.0000 mL | INTRAVENOUS | Status: DC | PRN
  Administered 2020-11-12: 10 mL

## 2020-11-12 MED ORDER — HEPARIN SOD (PORK) LOCK FLUSH 100 UNIT/ML IV SOLN
500.0000 [IU] | Freq: Once | INTRAVENOUS | Status: AC | PRN
Start: 2020-11-12 — End: 2020-11-12
  Administered 2020-11-12: 500 [IU]

## 2020-11-12 MED ORDER — SODIUM CHLORIDE 0.9% FLUSH
10.0000 mL | Freq: Once | INTRAVENOUS | Status: AC
  Administered 2020-11-12: 10 mL

## 2020-11-12 NOTE — Patient Instructions (Signed)
[facility]  Discharge Instructions: Thank you for choosing [facility] to provide your oncology and hematology care.   If you have a lab appointment with the [facility], please go directly to the [facility] and check in at the registration area.   Wear comfortable clothing and clothing appropriate for easy access to any Portacath or PICC line.   We strive to give you quality time with your provider. You may need to reschedule your appointment if you arrive late (15 or more minutes).  Arriving late affects you and other patients whose appointments are after yours.  Also, if you miss three or more appointments without notifying the office, you may be dismissed from the clinic at the provider's discretion.      For prescription refill requests, have your pharmacy contact our office and allow 72 hours for refills to be completed.    Today you received the following chemotherapy and/or immunotherapy agents : Shaun Kennedy      To help prevent nausea and vomiting after your treatment, we encourage you to take your nausea medication as directed.  BELOW ARE SYMPTOMS THAT SHOULD BE REPORTED IMMEDIATELY: *FEVER GREATER THAN 100.4 F (38 C) OR HIGHER *CHILLS OR SWEATING *NAUSEA AND VOMITING THAT IS NOT CONTROLLED WITH YOUR NAUSEA MEDICATION *UNUSUAL SHORTNESS OF BREATH *UNUSUAL BRUISING OR BLEEDING *URINARY PROBLEMS (pain or burning when urinating, or frequent urination) *BOWEL PROBLEMS (unusual diarrhea, constipation, pain near the anus) TENDERNESS IN MOUTH AND THROAT WITH OR WITHOUT PRESENCE OF ULCERS (sore throat, sores in mouth, or a toothache) UNUSUAL RASH, SWELLING OR PAIN  UNUSUAL VAGINAL DISCHARGE OR ITCHING   Items with * indicate a potential emergency and should be followed up as soon as possible or go to the Emergency Department if any problems should occur.  Please show the CHEMOTHERAPY ALERT CARD or IMMUNOTHERAPY ALERT CARD at  check-in to the Emergency Department and triage nurse.  Should you have questions after your visit or need to cancel or reschedule your appointment, please contact [facility]  Dept: [phone]  and follow the prompts.  Office hours are 8:00 a.m. to 4:30 p.m. Monday - Friday. Please note that voicemails left after 4:00 p.m. may not be returned until the following business day.  We are closed weekends and major holidays. You have access to a nurse at all times for urgent questions. Please call the main number to the clinic Dept: [phone] and follow the prompts.   For any non-urgent questions, you may also contact your provider using MyChart. We now offer e-Visits for anyone 59 and older to request care online for non-urgent symptoms. For details visit [email or web address].   Also download the MyChart app! Go to the app store, search "MyChart", open the app, select [facility], and log in with your MyChart username and password.  Due to Covid, a mask is required upon entering the hospital/clinic. If you do not have a mask, one will be given to you upon arrival. For doctor visits, patients may have 1 support person aged 45 or older with them. For treatment visits, patients cannot have anyone with them due to current Covid guidelines and our immunocompromised population.

## 2020-11-12 NOTE — Progress Notes (Signed)

## 2020-11-12 NOTE — Telephone Encounter (Signed)
Pt thought today was 10/04. Schedule message sent.

## 2020-11-12 NOTE — Progress Notes (Deleted)
Ipilimumab (YERVOY) Patient Monitoring Assessment   Is the patient experiencing any of the following general symptoms?:  [] Difficulty performing normal activities [] Feeling sluggish or cold all the time [] Unusual weight gain [] Constant or unusual headaches [] Feeling dizzy or faint [] Changes in eyesight (blurry vision, double vision, or other vision problems) [] Changes in mood or behavior (ex: decreased sex drive, irritability, or forgetfulness) [] Starting new medications (ex: steroids, other medications that lower immune response) [] Patient is not experiencing any of the general symptoms above.    Gastrointestinal  Patient is having *** bowel movements each day.  Is this different from baseline? [] Yes [] No Are your stools watery or do they have a foul smell? [] Yes [] No Have you seen blood in your stools? [] Yes [] No Are your stools dark, tarry, or sticky? [] Yes [] No Are you having pain or tenderness in your belly? [] Yes [] No  Skin Does your skin itch? [] Yes [] No Do you have a rash? [] Yes [] No Has your skin blistered and/or peeled? [] Yes [] No Do you have sores in your mouth? [] Yes [] No  Hepatic Has your urine been dark or tea colored? [] Yes [] No Have you noticed that your skin or the whites of your eyes are turning yellow? [] Yes [] No Are you bleeding or bruising more easily than normal? [] Yes [] No Are you nauseous and/or vomiting? [] Yes [] No Do you have pain on the right side of your stomach? [] Yes [] No  Neurologic  Are you having unusual weakness of legs, arms, or face? [] Yes [] No Are you having numbness or tingling in your hands or feet? [] Yes [] No  Shaun Kennedy

## 2020-11-12 NOTE — Progress Notes (Signed)
[facility] Telephone:([phone]) [phone]   Fax:([phone]  OFFICE PROGRESS NOTE  Shaun Kennedy, [name] [address]  DIAGNOSIS: stage IV (T3, N2, M1 a) non-small cell Kennedy cancer, squamous cell carcinoma presented with obstructive left lower lobe Kennedy mass in addition to mediastinal lymphadenopathy as well as bilateral pulmonary nodules diagnosed in July 2021.   PDL1: 0%   PRIOR THERAPY: None   CURRENT THERAPY: 1) Palliative radiotherapy to the obstructive left lower lobe Kennedy mass under the care of Dr. Lisbeth Renshaw.  2)  systemic chemotherapy 2 cycles of chemotherapy with carboplatin for an AUC of 5 and paclitaxel 175 mg/m2 in addition to immunotherapy with nivolumab 360 mg every 3 weeks and ipilimumab 1 mg/kg IV every 6 weeks followed by maintenance nivolumab and ipilimumab.  He started the first treatment on 09/04/2019.  He is status post 10 cycles of the treatment.  INTERVAL HISTORY: Shaun Kennedy 80 y.o. male returns to the clinic today for follow-up visit.  The patient is feeling fine today with no concerning complaints.  He denied having any current chest pain, shortness of breath except with exertion with no cough or hemoptysis.  He denied having any fever or chills.  He has no nausea, vomiting, diarrhea or constipation.  He has no headache or visual changes.  He was supposed to have repeat CT scan of the chest, abdomen and pelvis before this visit but unfortunately has a scan scheduled to be done next week.  The patient is here today for evaluation before starting cycle #11 of his treatment.  MEDICAL HISTORY: Past Medical History:  Diagnosis Date   Allergic rhinitis    Asthma    Carotid stenosis    Colon cancer ([facility]) 2003   Diabetes mellitus ([facility])    Diverticulosis    Dyslipidemia    ED (erectile dysfunction)    GERD (gastroesophageal reflux disease)    H/O degenerative disc disease    Hemorrhoids    HTN (hypertension)     Hyperlipidemia    Kennedy cancer (HCC)    LVH (left ventricular hypertrophy)    on EKG   Mass of lower lobe of left Kennedy    Mediastinal adenopathy    Smoker    former   Wears dentures    Wears dentures    Wears glasses    Wears glasses     ALLERGIES:  has No Known Allergies.  MEDICATIONS:  Current Outpatient Medications  Medication Sig Dispense Refill   acetaminophen (TYLENOL) 500 MG tablet Take 1,000 mg by mouth every 4 (four) hours as needed. (Patient not taking: No sig reported)     Alcohol Swabs (DROPSAFE ALCOHOL PREP) 70 % PADS Apply topically.     amLODipine (NORVASC) 5 MG tablet Take 1 tablet (5 mg total) by mouth daily. 90 tablet 3   ANORO ELLIPTA 62.5-25 MCG/INH AEPB Inhale 1 puff into the lungs daily. (Patient not taking: No sig reported)     aspirin EC 81 MG tablet Take 81 mg by mouth daily after breakfast.      Blood Glucose Calibration (TRUE METRIX LEVEL 1) Low SOLN      dronabinol (MARINOL) 2.5 MG capsule Take 1 capsule (2.5 mg total) by mouth 2 (two) times daily before a meal. 60 capsule 1   ferrous sulfate 325 (65 FE) MG EC tablet Take 1 tablet (325 mg total) by mouth daily with breakfast. 30 tablet 12   HYDROcodone-acetaminophen (NORCO/VICODIN) 5-325 MG  tablet Take 1 tablet by mouth every 4 (four) hours as needed. 10 tablet 0   Lancet Devices (TRUEDRAW LANCING DEVICE) MISC Check sugars daily 90 each 0   lisinopril-hydrochlorothiazide (ZESTORETIC) 20-12.5 MG tablet Take 1 tablet by mouth daily.     metFORMIN (GLUCOPHAGE-XR) 500 MG 24 hr tablet TAKE 1 TABLET EVERY DAY 90 tablet 1   Multiple Vitamin (MULTIVITAMIN WITH MINERALS) TABS tablet Take 1 tablet by mouth daily after breakfast.      senna-docusate (SENOKOT-S) 8.6-50 MG tablet Take 2 tablets by mouth 2 (two) times daily. (Patient not taking: No sig reported) 30 tablet 0   simvastatin (ZOCOR) 40 MG tablet Take 40 mg by mouth daily.     solifenacin (VESICARE) 5 MG tablet TAKE 1 TABLET EVERY DAY 90 tablet 1   TRUE  METRIX BLOOD GLUCOSE TEST test strip SMARTSIG:Via Meter     TRUEplus Lancets 33G MISC 1 each by Does not apply route 2 (two) times daily. 200 each 4   No current facility-administered medications for this visit.   Facility-Administered Medications Ordered in Other Visits  Medication Dose Route Frequency Provider Last Rate Last Admin   sodium chloride flush (NS) 0.9 % injection 10 mL  10 mL Intracatheter PRN Curt Bears, MD   10 mL at 09/04/19 1556    SURGICAL HISTORY:  Past Surgical History:  Procedure Laterality Date   BRONCHIAL BIOPSY  08/20/2019   Procedure: BRONCHIAL BIOPSIES;  Surgeon: Collene Gobble, MD;  Location[facility] ENDOSCOPY;  Service: Pulmonary;;   BRONCHIAL BRUSHINGS  08/20/2019   Procedure: BRONCHIAL BRUSHINGS;  Surgeon: Collene Gobble, MD;  Location: [facility] ENDOSCOPY;  Service: Pulmonary;;   BRONCHIAL NEEDLE ASPIRATION BIOPSY  08/20/2019   Procedure: BRONCHIAL NEEDLE ASPIRATION BIOPSIES;  Surgeon: Collene Gobble, MD;  Location: [facility];  Service: Pulmonary;;   CARPAL TUNNEL RELEASE Right 01/09/2019   Procedure: CARPAL TUNNEL RELEASE;  Surgeon: Leandrew Koyanagi, MD;  Location: [facility];  Service: Orthopedics;  Laterality: Right;   CATARACT EXTRACTION Right 2018   COLONOSCOPY  2007   Dr. Benson Norway   DIRECT LARYNGOSCOPY N/A 04/10/2020   Procedure: DIRECT LARYNGOSCOPY;  Surgeon: Melida Quitter, MD;  Location: [facility];  Service: ENT;  Laterality: N/A;   I & D EXTREMITY Right 04/02/2016   Procedure: IRRIGATION AND DEBRIDEMENT GREAT TOE;  Surgeon: Leandrew Koyanagi, MD;  Location: [facility];  Service: Orthopedics;  Laterality: Right;   IR IMAGING GUIDED PORT INSERTION  08/30/2019   MULTIPLE TOOTH EXTRACTIONS     RADIOACTIVE SEED IMPLANT  2003   TRACHEOSTOMY TUBE PLACEMENT N/A 04/10/2020   Procedure: TRACHEOSTOMY;  Surgeon: Melida Quitter, MD;  Location: [facility];  Service: ENT;  Laterality: N/A;   ULNAR TUNNEL RELEASE Right 01/09/2019   Procedure: RIGHT CUBITAL TUNNEL RELEASE AND  CARPAL TUNNEL RELEASE;  Surgeon: Leandrew Koyanagi, MD;  Location: [facility];  Service: Orthopedics;  Laterality: Right;   UPPER GASTROINTESTINAL ENDOSCOPY     VIDEO BRONCHOSCOPY N/A 12/18/2019   Procedure: VIDEO BRONCHOSCOPY WITHOUT FLUORO;  Surgeon: Collene Gobble, MD;  Location: [facility] ENDOSCOPY;  Service: Cardiopulmonary;  Laterality: N/A;   VIDEO BRONCHOSCOPY WITH ENDOBRONCHIAL ULTRASOUND N/A 08/20/2019   Procedure: VIDEO BRONCHOSCOPY WITH ENDOBRONCHIAL ULTRASOUND;  Surgeon: Collene Gobble, MD;  Location: [facility] ENDOSCOPY;  Service: Pulmonary;  Laterality: N/A;    REVIEW OF SYSTEMS:  A comprehensive review of systems was negative except for: Respiratory: positive for dyspnea on exertion   PHYSICAL EXAMINATION: General appearance: alert,  cooperative, and no distress Head: Normocephalic, without obvious abnormality, atraumatic Neck: no adenopathy, no JVD, supple, symmetrical, trachea midline, and thyroid not enlarged, symmetric, no tenderness/mass/nodules Lymph nodes: Cervical, supraclavicular, and axillary nodes normal. Resp: clear to auscultation bilaterally Back: symmetric, no curvature. ROM normal. No CVA tenderness. Cardio: regular rate and rhythm, S1, S2 normal, no murmur, click, rub or gallop GI: soft, non-tender; bowel sounds normal; no masses,  no organomegaly Extremities: extremities normal, atraumatic, no cyanosis or edema  ECOG PERFORMANCE STATUS: 1 - Symptomatic but completely ambulatory  Blood pressure 106/61, pulse 66, temperature (!) 97 F (36.1 C), temperature source Tympanic, resp. rate 18, height '5\' 7"'  (1.702 m), weight 180 lb 9.6 oz (81.9 kg), SpO2 100 %.  LABORATORY DATA: Lab Results  Component Value Date   WBC 6.1 10/21/2020   HGB 10.1 (L) 10/21/2020   HCT 30.3 (L) 10/21/2020   MCV 93.2 10/21/2020   PLT 268 10/21/2020      Chemistry      Component Value Date/Time   NA 140 10/21/2020 0917   NA 132 (L) 08/14/2019 0918   K 3.8 10/21/2020 0917   CL  106 10/21/2020 0917   CO2 26 10/21/2020 0917   BUN 15 10/21/2020 0917   BUN 16 08/14/2019 0918   CREATININE 1.22 10/21/2020 0917   CREATININE 1.25 (H) 08/08/2016 0912      Component Value Date/Time   CALCIUM 9.2 10/21/2020 0917   ALKPHOS 56 10/21/2020 0917   AST 17 10/21/2020 0917   ALT 13 10/21/2020 0917   BILITOT 0.6 10/21/2020 0917       RADIOGRAPHIC STUDIES: No results found.  ASSESSMENT AND PLAN: This is a very pleasant 80 years old [name] with stage IV non-small cell Kennedy cancer, squamous cell carcinoma with negative PD-L1 expression. The patient is currently undergoing systemic chemotherapy with carboplatin and paclitaxel for 2 cycles in addition to immunotherapy with ipilimumab 1 mg/KG every 6 weeks and nivolumab 360 mg IV every 3 weeks status post 10 cycles. The patient has been tolerating this treatment well with no concerning adverse effects. I recommended for him to proceed with day 1 of cycle #11 today as planned. I will see him back for follow-up visit in 3 weeks for evaluation before the next cycle of his treatment. He is scheduled to have repeat imaging studies with CT scan of the chest, abdomen and pelvis next week. He was advised to call immediately if he has any concerning symptoms in the interval. The patient voices understanding of current disease status and treatment options and is in agreement with the current care plan.  All questions were answered. The patient knows to call the clinic with any problems, questions or concerns. We can certainly see the patient much sooner if necessary.  Disclaimer: This note was dictated with voice recognition software. Similar sounding words can inadvertently be transcribed and may not be corrected upon review.

## 2020-11-15 DIAGNOSIS — C3432 Malignant neoplasm of lower lobe, left bronchus or lung: Secondary | ICD-10-CM | POA: Diagnosis not present

## 2020-11-15 DIAGNOSIS — J386 Stenosis of larynx: Secondary | ICD-10-CM | POA: Diagnosis not present

## 2020-11-15 DIAGNOSIS — Z930 Tracheostomy status: Secondary | ICD-10-CM | POA: Diagnosis not present

## 2020-11-15 DIAGNOSIS — J383 Other diseases of vocal cords: Secondary | ICD-10-CM | POA: Diagnosis not present

## 2020-11-16 DIAGNOSIS — J386 Stenosis of larynx: Secondary | ICD-10-CM | POA: Diagnosis not present

## 2020-11-16 DIAGNOSIS — C3432 Malignant neoplasm of lower lobe, left bronchus or lung: Secondary | ICD-10-CM | POA: Diagnosis not present

## 2020-11-16 DIAGNOSIS — Z930 Tracheostomy status: Secondary | ICD-10-CM | POA: Diagnosis not present

## 2020-11-17 ENCOUNTER — Other Ambulatory Visit: Payer: Self-pay

## 2020-11-17 ENCOUNTER — Inpatient Hospital Stay (HOSPITAL_COMMUNITY): Admission: RE | Admit: 2020-11-17 | Payer: Medicare HMO | Source: Ambulatory Visit

## 2020-11-17 ENCOUNTER — Ambulatory Visit (HOSPITAL_COMMUNITY)
Admission: RE | Admit: 2020-11-17 | Discharge: 2020-11-17 | Disposition: A | Discharge status: Home / Self Care | Payer: Medicare HMO | Source: Ambulatory Visit | Attending: Internal Medicine | Admitting: Internal Medicine

## 2020-11-17 ENCOUNTER — Encounter (HOSPITAL_COMMUNITY): Payer: Self-pay | Admitting: Orthopedic Surgery

## 2020-11-17 DIAGNOSIS — C799 Secondary malignant neoplasm of unspecified site: Secondary | ICD-10-CM | POA: Diagnosis not present

## 2020-11-17 DIAGNOSIS — J90 Pleural effusion, not elsewhere classified: Secondary | ICD-10-CM | POA: Diagnosis not present

## 2020-11-17 DIAGNOSIS — C3490 Malignant neoplasm of unspecified part of unspecified bronchus or lung: Secondary | ICD-10-CM | POA: Insufficient documentation

## 2020-11-17 DIAGNOSIS — K5730 Diverticulosis of large intestine without perforation or abscess without bleeding: Secondary | ICD-10-CM | POA: Diagnosis not present

## 2020-11-17 DIAGNOSIS — R918 Other nonspecific abnormal finding of lung field: Secondary | ICD-10-CM | POA: Diagnosis not present

## 2020-11-17 DIAGNOSIS — J439 Emphysema, unspecified: Secondary | ICD-10-CM | POA: Diagnosis not present

## 2020-11-17 MED ORDER — IOHEXOL 350 MG/ML SOLN
75.0000 mL | Freq: Once | INTRAVENOUS | Status: AC | PRN
  Administered 2020-11-17: 75 mL via INTRAVENOUS

## 2020-11-17 NOTE — Progress Notes (Addendum)
[name] denies chest pain or shortness of breath. Patient denies having any s/s of Covid in his household.  Patient denies any known exposure to Covid.   Mr Shaun Kennedy has Type II Diabetes, patient checks CBG every day, it was 90 this am and that is where it runs.  I instructed patient to not take Metformin in am. I instructed patient to check CBG after awaking and every 2 hours until arrival  to the hospital.  I Instructed patient if CBG is less than 70 to take 4 Glucose Tablets or 1 tube of Glucose Gel or 1/2 cup of a clear juice. Recheck CBG in 15 minutes if CBG is not over 70 call, pre- op desk at [phone] for further instructions.   I instructed [name] to not take any vitamins until after surgery.   PCP is Dr. Jill Shaun Kennedy.  I see on [name] that patient has an appointment with the [facility] this afternoon. [name] reports that he is not aware of an appointment in the trach clinic today. I read to patient the information regarding that the trach is uncomfortable, Mrs. Shaun Kennedy denies given that information to anyone. I called the trach clinic number - there was no answer or voice mail.  I called Respiratory number and left a voice message and left the information that I have, I asked if they could notify the [facility] and have someone call patient.

## 2020-11-18 ENCOUNTER — Encounter (HOSPITAL_COMMUNITY): Payer: Self-pay | Admitting: Orthopedic Surgery

## 2020-11-18 ENCOUNTER — Ambulatory Visit (HOSPITAL_COMMUNITY): Payer: Medicare HMO | Admitting: Certified Registered"

## 2020-11-18 ENCOUNTER — Ambulatory Visit (HOSPITAL_COMMUNITY)
Admission: RE | Admit: 2020-11-18 | Discharge: 2020-11-18 | Disposition: A | Discharge status: Home / Self Care | Payer: Medicare HMO | Attending: Orthopedic Surgery | Admitting: Orthopedic Surgery

## 2020-11-18 ENCOUNTER — Other Ambulatory Visit: Payer: Self-pay

## 2020-11-18 ENCOUNTER — Encounter (HOSPITAL_COMMUNITY)
Admission: RE | Disposition: A | Discharge status: Home / Self Care | Payer: Self-pay | Source: Home / Self Care | Attending: Orthopedic Surgery

## 2020-11-18 DIAGNOSIS — Z7982 Long term (current) use of aspirin: Secondary | ICD-10-CM | POA: Insufficient documentation

## 2020-11-18 DIAGNOSIS — G5601 Carpal tunnel syndrome, right upper limb: Secondary | ICD-10-CM | POA: Diagnosis not present

## 2020-11-18 DIAGNOSIS — Z87891 Personal history of nicotine dependence: Secondary | ICD-10-CM | POA: Diagnosis not present

## 2020-11-18 DIAGNOSIS — Z7984 Long term (current) use of oral hypoglycemic drugs: Secondary | ICD-10-CM | POA: Diagnosis not present

## 2020-11-18 DIAGNOSIS — G5621 Lesion of ulnar nerve, right upper limb: Secondary | ICD-10-CM | POA: Insufficient documentation

## 2020-11-18 DIAGNOSIS — Z79899 Other long term (current) drug therapy: Secondary | ICD-10-CM | POA: Diagnosis not present

## 2020-11-18 DIAGNOSIS — I700 Atherosclerosis of aorta: Secondary | ICD-10-CM | POA: Diagnosis not present

## 2020-11-18 DIAGNOSIS — K219 Gastro-esophageal reflux disease without esophagitis: Secondary | ICD-10-CM | POA: Diagnosis not present

## 2020-11-18 HISTORY — PX: CARPAL TUNNEL WITH CUBITAL TUNNEL: SHX5608

## 2020-11-18 LAB — GLUCOSE, CAPILLARY
Glucose-Capillary: 93 mg/dL (ref 70–99)
Glucose-Capillary: 87 mg/dL (ref 70–99)
Glucose-Capillary: 85 mg/dL (ref 70–99)

## 2020-11-18 SURGERY — RELEASE, CARPAL TUNNEL AND CUBITAL TUNNEL
Anesthesia: General | Site: Hand | Laterality: Right

## 2020-11-18 MED ORDER — CEFAZOLIN SODIUM 1 G IJ SOLR
INTRAMUSCULAR | Status: AC
  Filled 2020-11-18: qty 10

## 2020-11-18 MED ORDER — PHENYLEPHRINE 40 MCG/ML (10ML) SYRINGE FOR IV PUSH (FOR BLOOD PRESSURE SUPPORT)
PREFILLED_SYRINGE | INTRAVENOUS | Status: AC
  Filled 2020-11-18: qty 10

## 2020-11-18 MED ORDER — ONDANSETRON HCL 4 MG/2ML IJ SOLN
INTRAMUSCULAR | Status: DC | PRN
  Administered 2020-11-18: 4 mg via INTRAVENOUS

## 2020-11-18 MED ORDER — DEXMEDETOMIDINE (PRECEDEX) IN NS 20 MCG/5ML (4 MCG/ML) IV SYRINGE
PREFILLED_SYRINGE | INTRAVENOUS | Status: AC
  Filled 2020-11-18: qty 5

## 2020-11-18 MED ORDER — DEXAMETHASONE SODIUM PHOSPHATE 10 MG/ML IJ SOLN
INTRAMUSCULAR | Status: DC | PRN
  Administered 2020-11-18: 5 mg via INTRAVENOUS

## 2020-11-18 MED ORDER — ORAL CARE MOUTH RINSE: 15.0000 mL | Freq: Once | OROMUCOSAL | Status: AC

## 2020-11-18 MED ORDER — FENTANYL CITRATE (PF) 100 MCG/2ML IJ SOLN
INTRAMUSCULAR | Status: AC
  Filled 2020-11-18: qty 2

## 2020-11-18 MED ORDER — ROCURONIUM BROMIDE 10 MG/ML (PF) SYRINGE
PREFILLED_SYRINGE | INTRAVENOUS | Status: AC
  Filled 2020-11-18: qty 10

## 2020-11-18 MED ORDER — GLYCOPYRROLATE PF 0.2 MG/ML IJ SOSY
PREFILLED_SYRINGE | INTRAMUSCULAR | Status: AC
  Filled 2020-11-18: qty 2

## 2020-11-18 MED ORDER — OXYCODONE-ACETAMINOPHEN 5-325 MG PO TABS: 1.0000 | ORAL_TABLET | ORAL | 0 refills | Status: DC | PRN

## 2020-11-18 MED ORDER — LACTATED RINGERS IV SOLN: INTRAVENOUS | Status: DC

## 2020-11-18 MED ORDER — DEXAMETHASONE SODIUM PHOSPHATE 10 MG/ML IJ SOLN
INTRAMUSCULAR | Status: AC
  Filled 2020-11-18: qty 2

## 2020-11-18 MED ORDER — MIDAZOLAM HCL 2 MG/2ML IJ SOLN
INTRAMUSCULAR | Status: AC
  Filled 2020-11-18: qty 2

## 2020-11-18 MED ORDER — FENTANYL CITRATE (PF) 250 MCG/5ML IJ SOLN
INTRAMUSCULAR | Status: AC
  Filled 2020-11-18: qty 5

## 2020-11-18 MED ORDER — PROPOFOL 10 MG/ML IV BOLUS
INTRAVENOUS | Status: DC | PRN
  Administered 2020-11-18 (×2): 50 mg via INTRAVENOUS

## 2020-11-18 MED ORDER — FENTANYL CITRATE (PF) 100 MCG/2ML IJ SOLN: 25.0000 ug | INTRAMUSCULAR | Status: DC | PRN

## 2020-11-18 MED ORDER — FENTANYL CITRATE (PF) 100 MCG/2ML IJ SOLN
INTRAMUSCULAR | Status: DC | PRN
  Administered 2020-11-18 (×3): 25 ug via INTRAVENOUS

## 2020-11-18 MED ORDER — PROPOFOL 1000 MG/100ML IV EMUL
INTRAVENOUS | Status: AC
  Filled 2020-11-18: qty 100

## 2020-11-18 MED ORDER — LIDOCAINE 2% (20 MG/ML) 5 ML SYRINGE
INTRAMUSCULAR | Status: AC
  Filled 2020-11-18: qty 15

## 2020-11-18 MED ORDER — CHLORHEXIDINE GLUCONATE 0.12 % MT SOLN
15.0000 mL | Freq: Once | OROMUCOSAL | Status: AC
  Administered 2020-11-18: 15 mL via OROMUCOSAL
  Filled 2020-11-18: qty 15

## 2020-11-18 MED ORDER — MIDAZOLAM HCL 5 MG/5ML IJ SOLN
INTRAMUSCULAR | Status: DC | PRN
  Administered 2020-11-18: 1 mg via INTRAVENOUS

## 2020-11-18 MED ORDER — BUPIVACAINE HCL (PF) 0.25 % IJ SOLN
INTRAMUSCULAR | Status: AC
  Filled 2020-11-18: qty 30

## 2020-11-18 MED ORDER — CEFAZOLIN SODIUM-DEXTROSE 2-4 GM/100ML-% IV SOLN
2.0000 g | INTRAVENOUS | Status: AC
  Administered 2020-11-18: 2 g via INTRAVENOUS
  Filled 2020-11-18: qty 100

## 2020-11-18 MED ORDER — 0.9 % SODIUM CHLORIDE (POUR BTL) OPTIME
TOPICAL | Status: DC | PRN
  Administered 2020-11-18: 1000 mL

## 2020-11-18 MED ORDER — ONDANSETRON HCL 4 MG/2ML IJ SOLN: 4.0000 mg | Freq: Once | INTRAMUSCULAR | Status: DC | PRN

## 2020-11-18 MED ORDER — ONDANSETRON HCL 4 MG/2ML IJ SOLN
INTRAMUSCULAR | Status: AC
  Filled 2020-11-18: qty 4

## 2020-11-18 MED ORDER — GLYCOPYRROLATE PF 0.2 MG/ML IJ SOSY
PREFILLED_SYRINGE | INTRAMUSCULAR | Status: DC | PRN
  Administered 2020-11-18: .4 mg via INTRAVENOUS

## 2020-11-18 MED ORDER — BUPIVACAINE HCL (PF) 0.25 % IJ SOLN
INTRAMUSCULAR | Status: DC | PRN
  Administered 2020-11-18: 20 mL

## 2020-11-18 SURGICAL SUPPLY — 46 items
APL SKNCLS STERI-STRIP NONHPOA (GAUZE/BANDAGES/DRESSINGS) ×1
BAG COUNTER SPONGE SURGICOUNT (BAG) IMPLANT
BAG SPNG CNTER NS LX DISP (BAG)
BENZOIN TINCTURE PRP APPL 2/3 (GAUZE/BANDAGES/DRESSINGS) ×1 IMPLANT
BNDG CMPR 9X4 STRL LF SNTH (GAUZE/BANDAGES/DRESSINGS) ×1
BNDG ELASTIC 3X5.8 VLCR STR LF (GAUZE/BANDAGES/DRESSINGS) ×2 IMPLANT
BNDG ELASTIC 4X5.8 VLCR STR LF (GAUZE/BANDAGES/DRESSINGS) ×2 IMPLANT
BNDG ESMARK 4X9 LF (GAUZE/BANDAGES/DRESSINGS) ×2 IMPLANT
BNDG GAUZE ELAST 4 BULKY (GAUZE/BANDAGES/DRESSINGS) ×2 IMPLANT
CLSR STERI-STRIP ANTIMIC 1/2X4 (GAUZE/BANDAGES/DRESSINGS) ×1 IMPLANT
CORD BIPOLAR FORCEPS 12FT (ELECTRODE) ×2 IMPLANT
COVER SURGICAL LIGHT HANDLE (MISCELLANEOUS) ×2 IMPLANT
CUFF TOURN SGL QUICK 18X4 (TOURNIQUET CUFF) ×1 IMPLANT
CUFF TOURN SGL QUICK 24 (TOURNIQUET CUFF)
CUFF TRNQT CYL 24X4X16.5-23 (TOURNIQUET CUFF) IMPLANT
DRAPE SURG 17X23 STRL (DRAPES) ×2 IMPLANT
DURAPREP 26ML APPLICATOR (WOUND CARE) ×2 IMPLANT
GAUZE SPONGE 4X4 12PLY STRL (GAUZE/BANDAGES/DRESSINGS) ×2 IMPLANT
GAUZE XEROFORM 1X8 LF (GAUZE/BANDAGES/DRESSINGS) ×1 IMPLANT
GLOVE SURG SYN 8.0 (GLOVE) ×2 IMPLANT
GLOVE SURG SYN 8.0 PF PI (GLOVE) ×1 IMPLANT
GOWN STRL REUS W/ TWL LRG LVL3 (GOWN DISPOSABLE) ×1 IMPLANT
GOWN STRL REUS W/ TWL XL LVL3 (GOWN DISPOSABLE) ×1 IMPLANT
GOWN STRL REUS W/TWL LRG LVL3 (GOWN DISPOSABLE) ×2
GOWN STRL REUS W/TWL XL LVL3 (GOWN DISPOSABLE) ×2
KIT BASIN OR (CUSTOM PROCEDURE TRAY) ×2 IMPLANT
KIT TURNOVER KIT B (KITS) ×2 IMPLANT
NDL HYPO 25GX1X1/2 BEV (NEEDLE) ×1 IMPLANT
NEEDLE HYPO 25GX1X1/2 BEV (NEEDLE) ×2 IMPLANT
NS IRRIG 1000ML POUR BTL (IV SOLUTION) ×2 IMPLANT
PACK ORTHO EXTREMITY (CUSTOM PROCEDURE TRAY) ×2 IMPLANT
PAD ARMBOARD 7.5X6 YLW CONV (MISCELLANEOUS) ×4 IMPLANT
PAD CAST 4YDX4 CTTN HI CHSV (CAST SUPPLIES) ×1 IMPLANT
PADDING CAST COTTON 4X4 STRL (CAST SUPPLIES) ×2
STRIP CLOSURE SKIN 1/2X4 (GAUZE/BANDAGES/DRESSINGS) ×1 IMPLANT
SUT ETHILON 4 0 PS 2 18 (SUTURE) IMPLANT
SUT PROLENE 3 0 PS 2 (SUTURE) ×1 IMPLANT
SUT PROLENE 4 0 PS 2 18 (SUTURE) ×1 IMPLANT
SUT VIC AB 3-0 PS2 18 (SUTURE) ×1 IMPLANT
SUT VIC AB 3-0 PS2 18XBRD (SUTURE) IMPLANT
SYR CONTROL 10ML LL (SYRINGE) IMPLANT
TOWEL GREEN STERILE (TOWEL DISPOSABLE) ×2 IMPLANT
TOWEL GREEN STERILE FF (TOWEL DISPOSABLE) ×2 IMPLANT
TUBE CONNECTING 12X1/4 (SUCTIONS) ×2 IMPLANT
TUBE TRACH FLEX 6.0 UNCF (MISCELLANEOUS) ×1 IMPLANT
UNDERPAD 30X36 HEAVY ABSORB (UNDERPADS AND DIAPERS) ×2 IMPLANT

## 2020-11-18 NOTE — Brief Op Note (Signed)
11/18/2020  3:26 PM  PATIENT:  Shaun Kennedy  80 y.o. male  PRE-OPERATIVE DIAGNOSIS:  right carpal tunnel release, right cubital tunnel release  POST-OPERATIVE DIAGNOSIS:  right carpal tunnel release, right cubital tunnel release  PROCEDURE:  Procedure(s): CARPAL TUNNEL WITH CUBITAL TUNNEL (Right)  SURGEON:  Surgeon(s) and Role:    Charlotte Crumb, MD - Primary  PHYSICIAN ASSISTANT:   ASSISTANTS: none   ANESTHESIA:   general  EBL: Minimal  BLOOD ADMINISTERED:none  DRAINS: none   LOCAL MEDICATIONS USED:  MARCAINE     SPECIMEN:  No Specimen  DISPOSITION OF SPECIMEN:  N/A  COUNTS:  YES  TOURNIQUET:   Total Tourniquet Time Documented: Upper Arm (Right) - 30 minutes Total: Upper Arm (Right) - 30 minutes   DICTATION: .Dragon Dictation  PLAN OF CARE: Discharge to home after PACU  PATIENT DISPOSITION:  PACU - hemodynamically stable.   Delay start of Pharmacological VTE agent (>24hrs) due to surgical blood loss or risk of bleeding: yes

## 2020-11-18 NOTE — Op Note (Signed)
Patient was taken the operating suite and after induction of adequate general anesthetic the right upper extremity was prepped and draped in the usual sterile fashion.  An Esmarch was used to exsanguinate the limb and the tourniquet was inflated 250 mmHg.  This point time incision made on the medial aspect of the right elbow 17 left arm process and the tip the medial epicondyle.  Skin was incised dissection was carried down to the cubital tunnel.  The ulnar nerve is identified and unroofed through the cubital tunnel and distally including the fascia overlying the flexor carpi ulnaris muscle and proximally under the skin bridge for 6 to 8 cm including release of the muscular septum off the medial epicondyle.  The ulnar nerve was complete decompressed and all pressure points were relieved.  It was stable in the groove to flexion extension.  The wound was irrigated loosely closed in layers of 3-0 Vicryl subcutaneously followed by 3-0 Prolene subcuticular stitch on the skin.  Second incision made the palmar aspect of the right hand and left long metacarpal starting at [place] cardinal line skin was incised dissection was carried down to level of the palmar fascia the palmar fascia was divided split in the distal edge of the transverse carpal ligament was identified the median nerve was identified protected with a freer elevator remaining aspect of the transverse carpal ligament was then divided under direct vision using a curved blunt scissor canal was inspected there were no osseous lesions or ganglions present this was irrigated loosely closed with a 4-0 Prolene subcuticular stitch Steri-Strips, 4 x 4's, and compressive dressings to the hand and elbow were applied on the right.  The patient tolerated these procedures well went to cover in stable fashion.

## 2020-11-18 NOTE — Anesthesia Preprocedure Evaluation (Addendum)
Anesthesia Evaluation  Patient identified by MRN, date of birth, ID band Patient awake    Reviewed: Allergy & Precautions, NPO status , Patient's Chart, lab work & pertinent test results  Airway Mallampati: III  TM Distance: >3 FB Neck ROM: Full    Dental no notable dental hx. (+) Edentulous Upper, Edentulous Lower   Pulmonary asthma , former smoker,  Quit smoking 1987, 32 pack year history   stage IV (T3, N2, M1 a) non-small cell lung cancer, squamous cell carcinoma presented with obstructive left lower lobe lung mass in addition to mediastinal lymphadenopathy as well as bilateral pulmonary nodules diagnosed in July 2021. S/p palliative radiation and chemo  March 2022 was admitted and diagnosed with subglottic stenosis ultimately requiring tracheostomy    Pulmonary exam normal breath sounds clear to auscultation       Cardiovascular hypertension, Pt. on medications Normal cardiovascular exam Rhythm:Regular Rate:Normal     Neuro/Psych negative neurological ROS  negative psych ROS   GI/Hepatic Neg liver ROS, GERD  Controlled,  Endo/Other  diabetes, Well Controlled, Type 2, Oral Hypoglycemic Agents  Renal/GU Renal InsufficiencyRenal diseaseCr 1.37  negative genitourinary   Musculoskeletal negative musculoskeletal ROS (+)   Abdominal   Peds negative pediatric ROS (+)  Hematology  (+) Blood dyscrasia, anemia , hct 28.5   Anesthesia Other Findings   Reproductive/Obstetrics negative OB ROS                             Anesthesia Physical Anesthesia Plan  ASA: 4  Anesthesia Plan: General   Post-op Pain Management:    Induction: Intravenous  PONV Risk Score and Plan: 2 and Ondansetron, Dexamethasone and Treatment may vary due to age or medical condition  Airway Management Planned: Tracheostomy  Additional Equipment: None  Intra-op Plan:   Post-operative Plan: Extubation in  OR  Informed Consent: I have reviewed the patients History and Physical, chart, labs and discussed the procedure including the risks, benefits and alternatives for the proposed anesthesia with the patient or authorized representative who has indicated his/her understanding and acceptance.     Dental advisory given  Plan Discussed with: CRNA  Anesthesia Plan Comments: (Will remove uncuffed trach and insert oral ETT w/ cuff. Per patient has had multiple trach changes w/o issue)        Anesthesia Quick Evaluation

## 2020-11-18 NOTE — Transfer of Care (Signed)
Immediate Anesthesia Transfer of Care Note  Patient: Shaun Kennedy  Procedure(s) Performed: CARPAL TUNNEL WITH CUBITAL TUNNEL (Right: Hand)  Patient Location: PACU  Anesthesia Type:General  Level of Consciousness: drowsy, patient cooperative and responds to stimulation  Airway & Oxygen Therapy: Patient Spontanous Breathing  Post-op Assessment: Report given to RN and Post -op Vital signs reviewed and stable  Post vital signs: Reviewed and stable  Last Vitals:  Vitals Value Taken Time  BP 131/74 11/18/20 1529  Temp    Pulse 88 11/18/20 1531  Resp 7 11/18/20 1531  SpO2 100 % 11/18/20 1531  Vitals shown include unvalidated device data.  Last Pain:  Vitals:   11/18/20 1127  TempSrc:   PainSc: 0-No pain         Complications: No notable events documented.

## 2020-11-18 NOTE — Anesthesia Postprocedure Evaluation (Signed)
Anesthesia Post Note  Patient: Shaun Kennedy  Procedure(s) Performed: CARPAL TUNNEL WITH CUBITAL TUNNEL (Right: Hand)     Patient location during evaluation: PACU Anesthesia Type: General Level of consciousness: awake and alert, oriented and patient cooperative Pain management: pain level controlled Vital Signs Assessment: post-procedure vital signs reviewed and stable Respiratory status: spontaneous breathing, nonlabored ventilation and respiratory function stable Cardiovascular status: blood pressure returned to baseline and stable Postop Assessment: no apparent nausea or vomiting Anesthetic complications: no   No notable events documented.  Last Vitals:  Vitals:   11/18/20 1545 11/18/20 1600  BP: 121/70 125/68  Pulse: 83 78  Resp: 19 11  Temp:  (!) 36.2 C  SpO2: 97% 97%    Last Pain:  Vitals:   11/18/20 1600  TempSrc:   PainSc: 0-No pain                 Pervis Hocking

## 2020-11-18 NOTE — H&P (Signed)
[facility] is an 80 y.o. male.   Chief Complaint: Right upper extremity pain, numbness, and weakness HPI patient is a very pleasant 80 year old male with carpal and cubital tunnel syndrome on the right side both clinically and on electrodiagnostic testing.  Past Medical History:  Diagnosis Date   Allergic rhinitis    Asthma    Carotid stenosis    Colon cancer ([place]) 2003   Diabetes mellitus ([place])    Diverticulosis    Dyslipidemia    ED (erectile dysfunction)    GERD (gastroesophageal reflux disease)    H/O degenerative disc disease    Hemorrhoids    HTN (hypertension)    as a child   Hyperlipidemia    Lung cancer ([place])    LVH (left ventricular hypertrophy)    on EKG   Mass of lower lobe of left lung    Mediastinal adenopathy    Smoker    former   Wears dentures    Wears dentures    Wears glasses    Wears glasses     Past Surgical History:  Procedure Laterality Date   BRONCHIAL BIOPSY  08/20/2019   Procedure: BRONCHIAL BIOPSIES;  Surgeon: Collene Gobble, MD;  Location: [facility] ENDOSCOPY;  Service: Pulmonary;;   BRONCHIAL BRUSHINGS  08/20/2019   Procedure: BRONCHIAL BRUSHINGS;  Surgeon: Collene Gobble, MD;  Location: [facility] ENDOSCOPY;  Service: Pulmonary;;   BRONCHIAL NEEDLE ASPIRATION BIOPSY  08/20/2019   Procedure: BRONCHIAL NEEDLE ASPIRATION BIOPSIES;  Surgeon: Collene Gobble, MD;  Location: [facility] ENDOSCOPY;  Service: Pulmonary;;   CARPAL TUNNEL RELEASE Right 01/09/2019   Procedure: CARPAL TUNNEL RELEASE;  Surgeon: Leandrew Koyanagi, MD;  Location: [place];  Service: Orthopedics;  Laterality: Right;   CATARACT EXTRACTION Right 2018   COLONOSCOPY  2007   Dr. Benson Norway   DIRECT LARYNGOSCOPY N/A 04/10/2020   Procedure: DIRECT LARYNGOSCOPY;  Surgeon: Melida Quitter, MD;  Location: [place];  Service: ENT;  Laterality: N/A;   I & D EXTREMITY Right 04/02/2016   Procedure: IRRIGATION AND DEBRIDEMENT GREAT TOE;  Surgeon: Leandrew Koyanagi, MD;  Location: [place];  Service: Orthopedics;   Laterality: Right;   IR IMAGING GUIDED PORT INSERTION  08/30/2019   MULTIPLE TOOTH EXTRACTIONS     RADIOACTIVE SEED IMPLANT  2003   TRACHEOSTOMY TUBE PLACEMENT N/A 04/10/2020   Procedure: TRACHEOSTOMY;  Surgeon: Melida Quitter, MD;  Location: [place];  Service: ENT;  Laterality: N/A;   ULNAR TUNNEL RELEASE Right 01/09/2019   Procedure: RIGHT CUBITAL TUNNEL RELEASE AND CARPAL TUNNEL RELEASE;  Surgeon: Leandrew Koyanagi, MD;  Location: [place];  Service: Orthopedics;  Laterality: Right;   UPPER GASTROINTESTINAL ENDOSCOPY     VIDEO BRONCHOSCOPY N/A 12/18/2019   Procedure: VIDEO BRONCHOSCOPY WITHOUT FLUORO;  Surgeon: Collene Gobble, MD;  Location: [facility] ENDOSCOPY;  Service: Cardiopulmonary;  Laterality: N/A;   VIDEO BRONCHOSCOPY WITH ENDOBRONCHIAL ULTRASOUND N/A 08/20/2019   Procedure: VIDEO BRONCHOSCOPY WITH ENDOBRONCHIAL ULTRASOUND;  Surgeon: Collene Gobble, MD;  Location: [facility] ENDOSCOPY;  Service: Pulmonary;  Laterality: N/A;    Family History  Problem Relation Age of Onset   Heart failure Mother    Diabetes Mother    Stroke Father    Diabetes Father    Diabetes Sister    Diabetes Sister    Lung cancer Sister 33   Colon cancer Neg Hx    Esophageal cancer Neg Hx    Rectal cancer Neg Hx    Colon polyps Neg  Hx    Stomach cancer Neg Hx    Social History:  reports that he quit smoking about 35 years ago. His smoking use included cigarettes. He started smoking about 65 years ago. He has a 32.00 pack-year smoking history. He has never used smokeless tobacco. He reports that he does not drink alcohol and does not use drugs.  Allergies: No Known Allergies  Medications Prior to Admission  Medication Sig Dispense Refill   amLODipine (NORVASC) 5 MG tablet Take 1 tablet (5 mg total) by mouth daily. 90 tablet 3   aspirin EC 81 MG tablet Take 81 mg by mouth daily after breakfast.      bisacodyl (DULCOLAX) 5 MG EC tablet Take 10 mg by mouth daily as needed for moderate constipation.      ferrous sulfate 325 (65 FE) MG EC tablet Take 1 tablet (325 mg total) by mouth daily with breakfast. 30 tablet 12   lisinopril-hydrochlorothiazide (ZESTORETIC) 20-12.5 MG tablet Take 1 tablet by mouth daily.     metFORMIN (GLUCOPHAGE-XR) 500 MG 24 hr tablet TAKE 1 TABLET EVERY DAY 90 tablet 1   Multiple Vitamin (MULTIVITAMIN WITH MINERALS) TABS tablet Take 1 tablet by mouth daily after breakfast.      simvastatin (ZOCOR) 40 MG tablet Take 40 mg by mouth daily.     solifenacin (VESICARE) 5 MG tablet TAKE 1 TABLET EVERY DAY 90 tablet 1   Alcohol Swabs (DROPSAFE ALCOHOL PREP) 70 % PADS Apply topically.     Blood Glucose Calibration (TRUE METRIX LEVEL 1) Low SOLN      dronabinol (MARINOL) 2.5 MG capsule Take 1 capsule (2.5 mg total) by mouth 2 (two) times daily before a meal. (Patient not taking: No sig reported) 60 capsule 1   Lancet Devices (TRUEDRAW LANCING DEVICE) MISC Check sugars daily 90 each 0   TRUE METRIX BLOOD GLUCOSE TEST test strip SMARTSIG:Via Meter     TRUEplus Lancets 33G MISC 1 each by Does not apply route 2 (two) times daily. 200 each 4    Results for orders placed or performed during the hospital encounter of 11/18/20 (from the past 48 hour(s))  Glucose, capillary     Status: None   Collection Time: 11/18/20 11:15 AM  Result Value Ref Range   Glucose-Capillary 93 70 - 99 mg/dL    Comment: Glucose reference range applies only to samples taken after fasting for at least 8 hours.  Glucose, capillary     Status: None   Collection Time: 11/18/20  1:24 PM  Result Value Ref Range   Glucose-Capillary 87 70 - 99 mg/dL    Comment: Glucose reference range applies only to samples taken after fasting for at least 8 hours.   CT Chest W Contrast  Result Date: 11/18/2020 CLINICAL DATA:  Metastatic squamous cell carcinoma restaging EXAM: CT CHEST, ABDOMEN, AND PELVIS WITH CONTRAST TECHNIQUE: Multidetector CT imaging of the chest, abdomen and pelvis was performed following the standard  protocol during bolus administration of intravenous contrast. CONTRAST:  47mL OMNIPAQUE IOHEXOL 350 MG/ML SOLN, additional oral enteric contrast COMPARISON:  08/19/2020 FINDINGS: CT CHEST FINDINGS Cardiovascular: Right chest port catheter. Aortic atherosclerosis. Normal heart size. Three-vessel coronary artery calcifications. No pericardial effusion. Mediastinum/Nodes: Interval decrease in size of subcarinal lymph nodes, measuring 2.5 x 1.3 cm, previously 2.9 x 1.9 cm (series 2, image 26). Thyroid gland, trachea, and esophagus demonstrate no significant findings. Lungs/Pleura: Tracheostomy. Mild centrilobular and paraseptal emphysema. Diffuse bilateral bronchial wall thickening. Unchanged post treatment appearance of the left  lung, with perihilar, paramedian left upper lobe, and superior segment left lower lobe fibrosis and consolidation. Unchanged small pulmonary nodules scattered throughout the lungs, the largest a 0.6 cm nodule of the dependent left lower lobe (series 4, image 76), also including a 0.5 cm nodule of the lateral segment right middle lobe (series 4, image 74). Unchanged small left pleural effusion. Musculoskeletal: No chest wall mass or suspicious bone lesions identified. CT ABDOMEN PELVIS FINDINGS Hepatobiliary: No solid liver abnormality is seen. No gallstones, gallbladder wall thickening, or biliary dilatation. Pancreas: Unremarkable. No pancreatic ductal dilatation or surrounding inflammatory changes. Spleen: Normal in size without significant abnormality. Adrenals/Urinary Tract: Adrenal glands are unremarkable. Kidneys are normal, without renal calculi, solid lesion, or hydronephrosis. Bladder is unremarkable. Stomach/Bowel: Stomach is within normal limits. Appendix appears normal. No evidence of bowel wall thickening, distention, or inflammatory changes. Large burden of stool throughout the colon and rectum. Descending and sigmoid diverticulosis. Vascular/Lymphatic: Aortic atherosclerosis. No  enlarged abdominal or pelvic lymph nodes. Reproductive: Prostate brachytherapy pellets. Other: Bilateral inguinal hernias, containing a single loop of nonobstructed distal small bowel on the right and fat on the left (series 2, image 111). No abdominopelvic ascites. Musculoskeletal: No acute or significant osseous findings. IMPRESSION: 1. Unchanged post treatment appearance of the left lung, with perihilar, paramedian left upper lobe, and superior segment left lower lobe fibrosis and consolidation. 2. Unchanged small pulmonary nodules scattered throughout the lungs. Continued attention on follow-up. 3. Interval decrease in size of subcarinal lymph nodes, consistent with treatment response. 4. Unchanged small left pleural effusion. 5. No evidence of metastatic disease in the abdomen or pelvis. 6. Prostate brachytherapy. 7. Coronary artery disease. Aortic Atherosclerosis (ICD10-I70.0) and Emphysema (ICD10-J43.9). Electronically Signed   By: Delanna Ahmadi M.D.   On: 11/18/2020 09:14   CT Abdomen Pelvis W Contrast  Result Date: 11/18/2020 CLINICAL DATA:  Metastatic squamous cell carcinoma restaging EXAM: CT CHEST, ABDOMEN, AND PELVIS WITH CONTRAST TECHNIQUE: Multidetector CT imaging of the chest, abdomen and pelvis was performed following the standard protocol during bolus administration of intravenous contrast. CONTRAST:  42mL OMNIPAQUE IOHEXOL 350 MG/ML SOLN, additional oral enteric contrast COMPARISON:  08/19/2020 FINDINGS: CT CHEST FINDINGS Cardiovascular: Right chest port catheter. Aortic atherosclerosis. Normal heart size. Three-vessel coronary artery calcifications. No pericardial effusion. Mediastinum/Nodes: Interval decrease in size of subcarinal lymph nodes, measuring 2.5 x 1.3 cm, previously 2.9 x 1.9 cm (series 2, image 26). Thyroid gland, trachea, and esophagus demonstrate no significant findings. Lungs/Pleura: Tracheostomy. Mild centrilobular and paraseptal emphysema. Diffuse bilateral bronchial wall  thickening. Unchanged post treatment appearance of the left lung, with perihilar, paramedian left upper lobe, and superior segment left lower lobe fibrosis and consolidation. Unchanged small pulmonary nodules scattered throughout the lungs, the largest a 0.6 cm nodule of the dependent left lower lobe (series 4, image 76), also including a 0.5 cm nodule of the lateral segment right middle lobe (series 4, image 74). Unchanged small left pleural effusion. Musculoskeletal: No chest wall mass or suspicious bone lesions identified. CT ABDOMEN PELVIS FINDINGS Hepatobiliary: No solid liver abnormality is seen. No gallstones, gallbladder wall thickening, or biliary dilatation. Pancreas: Unremarkable. No pancreatic ductal dilatation or surrounding inflammatory changes. Spleen: Normal in size without significant abnormality. Adrenals/Urinary Tract: Adrenal glands are unremarkable. Kidneys are normal, without renal calculi, solid lesion, or hydronephrosis. Bladder is unremarkable. Stomach/Bowel: Stomach is within normal limits. Appendix appears normal. No evidence of bowel wall thickening, distention, or inflammatory changes. Large burden of stool throughout the colon and rectum. Descending and sigmoid diverticulosis.  Vascular/Lymphatic: Aortic atherosclerosis. No enlarged abdominal or pelvic lymph nodes. Reproductive: Prostate brachytherapy pellets. Other: Bilateral inguinal hernias, containing a single loop of nonobstructed distal small bowel on the right and fat on the left (series 2, image 111). No abdominopelvic ascites. Musculoskeletal: No acute or significant osseous findings. IMPRESSION: 1. Unchanged post treatment appearance of the left lung, with perihilar, paramedian left upper lobe, and superior segment left lower lobe fibrosis and consolidation. 2. Unchanged small pulmonary nodules scattered throughout the lungs. Continued attention on follow-up. 3. Interval decrease in size of subcarinal lymph nodes, consistent  with treatment response. 4. Unchanged small left pleural effusion. 5. No evidence of metastatic disease in the abdomen or pelvis. 6. Prostate brachytherapy. 7. Coronary artery disease. Aortic Atherosclerosis (ICD10-I70.0) and Emphysema (ICD10-J43.9). Electronically Signed   By: Delanna Ahmadi M.D.   On: 11/18/2020 09:14    Review of Systems  All other systems reviewed and are negative.  Blood pressure (!) 145/67, pulse 77, temperature 97.9 F (36.6 C), temperature source Oral, resp. rate 18, height 5\' 7"  (1.702 m), weight 79.8 kg, SpO2 99 %. Physical Exam Constitutional:      Appearance: Normal appearance.  HENT:     Head: Normocephalic and atraumatic.  Eyes:     Pupils: Pupils are equal, round, and reactive to light.  Cardiovascular:     Rate and Rhythm: Normal rate.  Pulmonary:     Effort: Pulmonary effort is normal.  Musculoskeletal:     Right elbow: Tenderness present in medial epicondyle.     Right wrist: Tenderness present.     Cervical back: Normal range of motion.     Comments: Right carpal and cubital tunnel syndrome with positive signs of median and ulnar nerve irritability.  With provocative testing  Skin:    General: Skin is warm.  Neurological:     General: No focal deficit present.     Mental Status: He is alert and oriented to person, place, and time.  Psychiatric:        Mood and Affect: Mood normal.        Behavior: Behavior normal.        Thought Content: Thought content normal.        Judgment: Judgment normal.     Assessment/Pla  Patient is a very pleasant 80 year old male with carpal and cubital tunnel syndrome on his right side who despite conservative measures has persistent pain, numbness and now weakness.  Have discussed the role of carpal tunnel release and cubital tunnel release on the right side as an outpatient.  Patient understands risks and benefits the fact we cannot guarantee complete pain relief and the may be need for third surgery in the future.   He wished to proceed as an outpatient today. Schuyler Amor, MD 11/18/2020, 2:17 PM

## 2020-11-19 ENCOUNTER — Encounter (HOSPITAL_COMMUNITY): Payer: Self-pay | Admitting: Orthopedic Surgery

## 2020-11-20 ENCOUNTER — Emergency Department (HOSPITAL_COMMUNITY)
Admission: EM | Admit: 2020-11-20 | Discharge: 2020-11-20 | Disposition: A | Discharge status: Home / Self Care | Payer: Medicare HMO | Attending: Emergency Medicine | Admitting: Emergency Medicine

## 2020-11-20 ENCOUNTER — Telehealth: Payer: Self-pay

## 2020-11-20 ENCOUNTER — Encounter (HOSPITAL_COMMUNITY): Payer: Self-pay

## 2020-11-20 ENCOUNTER — Other Ambulatory Visit: Payer: Self-pay

## 2020-11-20 DIAGNOSIS — J9500 Unspecified tracheostomy complication: Secondary | ICD-10-CM | POA: Insufficient documentation

## 2020-11-20 DIAGNOSIS — J45909 Unspecified asthma, uncomplicated: Secondary | ICD-10-CM | POA: Diagnosis not present

## 2020-11-20 DIAGNOSIS — Z7984 Long term (current) use of oral hypoglycemic drugs: Secondary | ICD-10-CM | POA: Insufficient documentation

## 2020-11-20 DIAGNOSIS — Z85118 Personal history of other malignant neoplasm of bronchus and lung: Secondary | ICD-10-CM | POA: Diagnosis not present

## 2020-11-20 DIAGNOSIS — Z85038 Personal history of other malignant neoplasm of large intestine: Secondary | ICD-10-CM | POA: Diagnosis not present

## 2020-11-20 DIAGNOSIS — Z7982 Long term (current) use of aspirin: Secondary | ICD-10-CM | POA: Diagnosis not present

## 2020-11-20 DIAGNOSIS — I10 Essential (primary) hypertension: Secondary | ICD-10-CM | POA: Insufficient documentation

## 2020-11-20 DIAGNOSIS — Z79899 Other long term (current) drug therapy: Secondary | ICD-10-CM | POA: Insufficient documentation

## 2020-11-20 DIAGNOSIS — Z930 Tracheostomy status: Secondary | ICD-10-CM | POA: Diagnosis not present

## 2020-11-20 DIAGNOSIS — Z87891 Personal history of nicotine dependence: Secondary | ICD-10-CM | POA: Diagnosis not present

## 2020-11-20 DIAGNOSIS — E1169 Type 2 diabetes mellitus with other specified complication: Secondary | ICD-10-CM | POA: Insufficient documentation

## 2020-11-20 DIAGNOSIS — E785 Hyperlipidemia, unspecified: Secondary | ICD-10-CM | POA: Insufficient documentation

## 2020-11-20 DIAGNOSIS — J386 Stenosis of larynx: Secondary | ICD-10-CM | POA: Diagnosis not present

## 2020-11-20 NOTE — ED Triage Notes (Signed)
Pt here due to trach accidentally being pulled out. Pt states trach collar was pulled out by clothing. Pt not endorsing any chest pain, or SOB.

## 2020-11-20 NOTE — ED Provider Notes (Signed)
[facility] Provider Note   CSN: [id number] Arrival date & time: 11/20/20  0047     History Chief Complaint  Patient presents with   Tracheostomy Tube Change    Trach came out    Shaun Kennedy is a 80 y.o. male.  The history is provided by the patient.  He has history of hypertension, diabetes, hyperlipidemia, lung cancer, tracheostomy for glottic stenosis and accidentally dislodged his tracheostomy tonight.  He had recent surgery on his right arm and it is in a sling and asked when he moves his arm he accidentally caught his tracheostomy.  He is not having any difficulty breathing.   Past Medical History:  Diagnosis Date   Allergic rhinitis    Asthma    Carotid stenosis    Colon cancer ([place]) 2003   Diabetes mellitus ([place])    Diverticulosis    Dyslipidemia    ED (erectile dysfunction)    GERD (gastroesophageal reflux disease)    H/O degenerative disc disease    Hemorrhoids    HTN (hypertension)    as a child   Hyperlipidemia    Lung cancer ([place])    LVH (left ventricular hypertrophy)    on EKG   Mass of lower lobe of left lung    Mediastinal adenopathy    Smoker    former   Wears dentures    Wears dentures    Wears glasses    Wears glasses     Patient Active Problem List   Diagnosis Date Noted   Trachea, stenosis    Tracheostomy status ([place])    Left lower lobe pneumonia 04/20/2020   Pressure injury of skin 04/20/2020   Pleuritic chest pain 03/17/2020   Chemotherapy-induced neuropathy ([place]) 02/12/2020   Vocal cord dysfunction 02/12/2020   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   Stridor 12/18/2019   Abnormal findings on diagnostic imaging of lung 11/06/2019   Antineoplastic chemotherapy induced anemia 10/16/2019   Shortness of breath 10/15/2019   Odynophagia 09/25/2019   Port-A-Cath in place 09/17/2019   Decreased appetite 09/03/2019   Malignant neoplasm of lower lobe of left lung ([place]) 08/28/2019   Encounter for  antineoplastic chemotherapy 08/24/2019   Encounter for antineoplastic immunotherapy 08/24/2019   Goals of care, counseling/discussion 08/24/2019   Atherosclerosis of aorta ([place]) 04/09/2019   Carpal tunnel syndrome, left upper limb 02/19/2019   History of carpal tunnel surgery of right wrist 12/21/2018   OAB (overactive bladder) 10/11/2017   Diabetic nephropathy associated with type 2 diabetes mellitus ([place]) 10/11/2017   Onychomycosis of multiple toenails with type 2 diabetes mellitus ([place]) 07/22/2015   Former smoker, stopped smoking in distant past 07/22/2015   Diabetes mellitus type II, non insulin dependent ([place]) 06/13/2011   ED (erectile dysfunction) 06/13/2011   History of prostate cancer 06/13/2011   Hypertension associated with diabetes ([place]) 06/13/2011   Hyperlipidemia associated with type 2 diabetes mellitus ([place]) 06/13/2011   Allergic rhinitis, mild 06/13/2011   History of asthma 06/13/2011   Carotid stenosis 06/13/2011    Past Surgical History:  Procedure Laterality Date   BRONCHIAL BIOPSY  08/20/2019   Procedure: BRONCHIAL BIOPSIES;  Surgeon: Collene Gobble, MD;  Location: [place];  Service: Pulmonary;;   BRONCHIAL BRUSHINGS  08/20/2019   Procedure: BRONCHIAL BRUSHINGS;  Surgeon: Collene Gobble, MD;  Location: [place];  Service: Pulmonary;;   BRONCHIAL NEEDLE ASPIRATION BIOPSY  08/20/2019   Procedure: BRONCHIAL NEEDLE ASPIRATION BIOPSIES;  Surgeon: Collene Gobble, MD;  Location: [facility]  ENDOSCOPY;  Service: Pulmonary;;   CARPAL TUNNEL RELEASE Right 01/09/2019   Procedure: CARPAL TUNNEL RELEASE;  Surgeon: Leandrew Koyanagi, MD;  Location: [place];  Service: Orthopedics;  Laterality: Right;   CARPAL TUNNEL WITH CUBITAL TUNNEL Right 11/18/2020   Procedure: CARPAL TUNNEL WITH CUBITAL TUNNEL;  Surgeon: Charlotte Crumb, MD;  Location: [place];  Service: Orthopedics;  Laterality: Right;   CATARACT EXTRACTION Right 2018   COLONOSCOPY  2007   Dr. Benson Norway   DIRECT  LARYNGOSCOPY N/A 04/10/2020   Procedure: DIRECT LARYNGOSCOPY;  Surgeon: Melida Quitter, MD;  Location: [place];  Service: ENT;  Laterality: N/A;   I & D EXTREMITY Right 04/02/2016   Procedure: IRRIGATION AND DEBRIDEMENT GREAT TOE;  Surgeon: Leandrew Koyanagi, MD;  Location: [place];  Service: Orthopedics;  Laterality: Right;   IR IMAGING GUIDED PORT INSERTION  08/30/2019   MULTIPLE TOOTH EXTRACTIONS     RADIOACTIVE SEED IMPLANT  2003   TRACHEOSTOMY TUBE PLACEMENT N/A 04/10/2020   Procedure: TRACHEOSTOMY;  Surgeon: Melida Quitter, MD;  Location: [place];  Service: ENT;  Laterality: N/A;   ULNAR TUNNEL RELEASE Right 01/09/2019   Procedure: RIGHT CUBITAL TUNNEL RELEASE AND CARPAL TUNNEL RELEASE;  Surgeon: Leandrew Koyanagi, MD;  Location: [place];  Service: Orthopedics;  Laterality: Right;   UPPER GASTROINTESTINAL ENDOSCOPY     VIDEO BRONCHOSCOPY N/A 12/18/2019   Procedure: VIDEO BRONCHOSCOPY WITHOUT FLUORO;  Surgeon: Collene Gobble, MD;  Location: [facility] ENDOSCOPY;  Service: Cardiopulmonary;  Laterality: N/A;   VIDEO BRONCHOSCOPY WITH ENDOBRONCHIAL ULTRASOUND N/A 08/20/2019   Procedure: VIDEO BRONCHOSCOPY WITH ENDOBRONCHIAL ULTRASOUND;  Surgeon: Collene Gobble, MD;  Location[facility] ENDOSCOPY;  Service: Pulmonary;  Laterality: N/A;       Family History  Problem Relation Age of Onset   Heart failure Mother    Diabetes Mother    Stroke Father    Diabetes Father    Diabetes Sister    Diabetes Sister    Lung cancer Sister 42   Colon cancer Neg Hx    Esophageal cancer Neg Hx    Rectal cancer Neg Hx    Colon polyps Neg Hx    Stomach cancer Neg Hx     Social History   Tobacco Use   Smoking status: Former    Packs/day: 1.00    Years: 32.00    Pack years: 32.00    Types: Cigarettes    Start date: 02/08/1955    Quit date: 10/08/1985    Years since quitting: 35.1   Smokeless tobacco: Never  Vaping Use   Vaping Use: Never used  Substance Use Topics   Alcohol use: No   Drug use: No    Home  Medications Prior to Admission medications   Medication Sig Start Date End Date Taking? Authorizing Provider  Alcohol Swabs (DROPSAFE ALCOHOL PREP) 70 % PADS Apply topically. 08/26/20   [provider]  amLODipine (NORVASC) 5 MG tablet Take 1 tablet (5 mg total) by mouth daily. 04/07/20   [name], MD  aspirin EC 81 MG tablet Take 81 mg by mouth daily after breakfast.     [provider]  bisacodyl (DULCOLAX) 5 MG EC tablet Take 10 mg by mouth daily as needed for moderate constipation.    [provider]  Blood Glucose Calibration (TRUE METRIX LEVEL 1) Low SOLN  08/26/20   [provider]  dronabinol (MARINOL) 2.5 MG capsule Take 1 capsule (2.5 mg total) by mouth 2 (  two) times daily before a meal. Patient not taking: No sig reported 06/29/20   [name], MD  ferrous sulfate 325 (65 FE) MG EC tablet Take 1 tablet (325 mg total) by mouth daily with breakfast. 04/07/20   [name], MD  Lancet Devices (TRUEDRAW LANCING DEVICE) MISC Check sugars daily 08/31/20   [name], MD  lisinopril-hydrochlorothiazide (ZESTORETIC) 20-12.5 MG tablet Take 1 tablet by mouth daily. 08/17/20   [provider]  metFORMIN (GLUCOPHAGE-XR) 500 MG 24 hr tablet TAKE 1 TABLET EVERY DAY 08/17/20   [name], MD  Multiple Vitamin (MULTIVITAMIN WITH MINERALS) TABS tablet Take 1 tablet by mouth daily after breakfast.     [provider]  oxyCODONE-acetaminophen (PERCOCET) 5-325 MG tablet Take 1 tablet by mouth every 4 (four) hours as needed for severe pain. 11/18/20 11/18/21  Charlotte Crumb, MD  simvastatin (ZOCOR) 40 MG tablet Take 40 mg by mouth daily. 06/27/20   [provider]  solifenacin (VESICARE) 5 MG tablet TAKE 1 TABLET EVERY DAY 08/17/20   [name], MD  TRUE METRIX BLOOD GLUCOSE TEST test strip SMARTSIG:Via Meter 08/26/20   [provider]  TRUEplus Lancets 33G MISC 1 each by Does not apply route 2 (two) times daily.  10/27/20   [name], MD    Allergies    Patient has no known allergies.  Review of Systems   Review of Systems  All other systems reviewed and are negative.  Physical Exam Updated Vital Signs BP 119/60   Pulse (!) 53   Temp 98.5 F (36.9 C) (Oral)   Resp 16   Ht 5\' 7"  (1.702 m)   Wt 78.9 kg   SpO2 100%   BMI 27.25 kg/m   Physical Exam Vitals and nursing note reviewed.  80 year old male, resting comfortably and in no acute distress. Vital signs are significant for slightly slow heart rate. Oxygen saturation is 100%, which is normal. Head is normocephalic and atraumatic. PERRLA, EOMI. Oropharynx is clear. Neck : Tracheostomy is present, but patient is breathing and speaking without any air moving through the tracheostomy. Back is nontender and there is no CVA tenderness. Lungs are clear without rales, wheezes, or rhonchi. Chest is nontender. Heart has regular rate and rhythm without murmur. Abdomen is soft, flat, nontender. Extremities: Right arm in a sling. Skin is warm and dry without rash. Neurologic: Awake and alert, no focal deficits.  ED Results / Procedures / Treatments    Procedures Procedures   Medications Ordered in ED Medications - No data to display  ED Course  I have reviewed the triage vital signs and the nursing notes.  MDM Rules/Calculators/A&P                         Tracheostomy tube dislodged.  An attempt was made to replace the tracheostomy and was unsuccessful.  Case has been discussed with Dr. Redmond Baseman who is on-call for ENT who states he will come to replace the tracheostomy.  Old records are reviewed confirming tracheostomy placed for glottic stenosis.  Dr. Redmond Baseman has replaced the tracheostomy, and patient is discharged with instructions to resume his usual tracheostomy care.  Final Clinical Impression(s) / ED Diagnoses Final diagnoses:  Tracheostomy complication, unspecified complication type [facility])    Rx / DC Orders ED Discharge  Orders     None        Delora Fuel, MD 15/40/08 [phone]

## 2020-11-20 NOTE — Telephone Encounter (Signed)
Transition Care Management Unsuccessful Follow-up Telephone Call  Date of discharge and from where:  [facility]  Attempts:  1st Attempt  Reason for unsuccessful TCM follow-up call:  Left voice message

## 2020-11-20 NOTE — ED Provider Notes (Signed)
Emergency Medicine Provider Triage Evaluation Note  Shaun Kennedy , a 80 y.o. male  was evaluated in triage.  Pt complains of trach dislodgement.  Wearing shoulder sling due to recent right arm surgery, trach got caught on this and pulled out.  Review of Systems  Positive: Trach dislodgement Negative: SOB, bleeding  Physical Exam  BP 127/67 (BP Location: Left Arm)   Pulse 66   Temp 98.6 F (37 C) (Oral)   Resp 20   Ht 5\' 7"  (1.702 m)   Wt 78.9 kg   SpO2 98%   BMI 27.25 kg/m  Gen:   Awake, no distress   Resp:  Normal effort  MSK:   Moves extremities without difficulty  Other:  Trach stoma apparent without bleeding or signs of infection  Medical Decision Making  Medically screening exam initiated at 1:44 AM.  Appropriate orders placed.  Shaun Kennedy was informed that the remainder of the evaluation will be completed by another provider, this initial triage assessment does not replace that evaluation, and the importance of remaining in the ED until their evaluation is complete.  Per chart review, has Shiley size 6 uncuffed.  RT attempted to replace with comparable in triage without success.   Shaun Pickett, PA-C 11/20/20 3202    Shaun Speak, MD 11/20/20 [phone]

## 2020-11-20 NOTE — Progress Notes (Signed)
Called to bedside for trach dislodgement. Attempted to replace with #6 shiley but unable to place. Pt has lesion/skin in airway and unable to pass through into airway.

## 2020-11-20 NOTE — Consult Note (Signed)
Reason for Consult: Trach tube displacement Referring Physician: ER  Shaun Kennedy is an 80 y.o. male.  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  He came to the ER where there was further difficulty replacing the tube.  Consult was requested.  Past Medical History:  Diagnosis Date   Allergic rhinitis    Asthma    Carotid stenosis    Colon cancer ([place]) 2003   Diabetes mellitus ([place])    Diverticulosis    Dyslipidemia    ED (erectile dysfunction)    GERD (gastroesophageal reflux disease)    H/O degenerative disc disease    Hemorrhoids    HTN (hypertension)    as a child   Hyperlipidemia    Lung cancer ([place])    LVH (left ventricular hypertrophy)    on EKG   Mass of lower lobe of left lung    Mediastinal adenopathy    Smoker    former   Wears dentures    Wears dentures    Wears glasses    Wears glasses     Past Surgical History:  Procedure Laterality Date   BRONCHIAL BIOPSY  08/20/2019   Procedure: BRONCHIAL BIOPSIES;  Surgeon: Collene Gobble, MD;  Location: [facility] ENDOSCOPY;  Service: Pulmonary;;   BRONCHIAL BRUSHINGS  08/20/2019   Procedure: BRONCHIAL BRUSHINGS;  Surgeon: Collene Gobble, MD;  Location: [facility] ENDOSCOPY;  Service: Pulmonary;;   BRONCHIAL NEEDLE ASPIRATION BIOPSY  08/20/2019   Procedure: BRONCHIAL NEEDLE ASPIRATION BIOPSIES;  Surgeon: Collene Gobble, MD;  Location: [facility] ENDOSCOPY;  Service: Pulmonary;;   CARPAL TUNNEL RELEASE Right 01/09/2019   Procedure: CARPAL TUNNEL RELEASE;  Surgeon: Leandrew Koyanagi, MD;  Location: [place];  Service: Orthopedics;  Laterality: Right;   CARPAL TUNNEL WITH CUBITAL TUNNEL Right 11/18/2020   Procedure: CARPAL TUNNEL WITH CUBITAL TUNNEL;  Surgeon: Charlotte Crumb, MD;  Location: [place];  Service: Orthopedics;  Laterality: Right;   CATARACT EXTRACTION Right 2018   COLONOSCOPY  2007   Dr. Benson Norway   DIRECT LARYNGOSCOPY N/A  04/10/2020   Procedure: DIRECT LARYNGOSCOPY;  Surgeon: Melida Quitter, MD;  Location: [place];  Service: ENT;  Laterality: N/A;   I & D EXTREMITY Right 04/02/2016   Procedure: IRRIGATION AND DEBRIDEMENT GREAT TOE;  Surgeon: Leandrew Koyanagi, MD;  Location: [place];  Service: Orthopedics;  Laterality: Right;   IR IMAGING GUIDED PORT INSERTION  08/30/2019   MULTIPLE TOOTH EXTRACTIONS     RADIOACTIVE SEED IMPLANT  2003   TRACHEOSTOMY TUBE PLACEMENT N/A 04/10/2020   Procedure: TRACHEOSTOMY;  Surgeon: Melida Quitter, MD;  Location: [place];  Service: ENT;  Laterality: N/A;   ULNAR TUNNEL RELEASE Right 01/09/2019   Procedure: RIGHT CUBITAL TUNNEL RELEASE AND CARPAL TUNNEL RELEASE;  Surgeon: Leandrew Koyanagi, MD;  Location: [place];  Service: Orthopedics;  Laterality: Right;   UPPER GASTROINTESTINAL ENDOSCOPY     VIDEO BRONCHOSCOPY N/A 12/18/2019   Procedure: VIDEO BRONCHOSCOPY WITHOUT FLUORO;  Surgeon: Collene Gobble, MD;  Location: [facility] ENDOSCOPY;  Service: Cardiopulmonary;  Laterality: N/A;   VIDEO BRONCHOSCOPY WITH ENDOBRONCHIAL ULTRASOUND N/A 08/20/2019   Procedure: VIDEO BRONCHOSCOPY WITH ENDOBRONCHIAL ULTRASOUND;  Surgeon: Collene Gobble, MD;  Location: [facility] ENDOSCOPY;  Service: Pulmonary;  Laterality: N/A;    Family History  Problem Relation Age of Onset   Heart failure Mother    Diabetes Mother  Stroke Father    Diabetes Father    Diabetes Sister    Diabetes Sister    Lung cancer Sister 39   Colon cancer Neg Hx    Esophageal cancer Neg Hx    Rectal cancer Neg Hx    Colon polyps Neg Hx    Stomach cancer Neg Hx     Social History:  reports that he quit smoking about 35 years ago. His smoking use included cigarettes. He started smoking about 65 years ago. He has a 32.00 pack-year smoking history. He has never used smokeless tobacco. He reports that he does not drink alcohol and does not use drugs.  Allergies: No Known Allergies  Medications: I have reviewed the patient's current  medications.  Results for orders placed or performed during the hospital encounter of 11/18/20 (from the past 48 hour(s))  Glucose, capillary     Status: None   Collection Time: 11/18/20 11:15 AM  Result Value Ref Range   Glucose-Capillary 93 70 - 99 mg/dL    Comment: Glucose reference range applies only to samples taken after fasting for at least 8 hours.  Glucose, capillary     Status: None   Collection Time: 11/18/20  1:24 PM  Result Value Ref Range   Glucose-Capillary 87 70 - 99 mg/dL    Comment: Glucose reference range applies only to samples taken after fasting for at least 8 hours.  Glucose, capillary     Status: None   Collection Time: 11/18/20  3:31 PM  Result Value Ref Range   Glucose-Capillary 85 70 - 99 mg/dL    Comment: Glucose reference range applies only to samples taken after fasting for at least 8 hours.    No results found.  Review of Systems  All other systems reviewed and are negative. Blood pressure 119/60, pulse (!) 53, temperature 98.5 F (36.9 C), temperature source Oral, resp. rate 16, height 5\' 7"  (1.702 m), weight 78.9 kg, SpO2 100 %. Physical Exam Constitutional:      Appearance: Normal appearance. He is normal weight.  HENT:     Head: Normocephalic and atraumatic.     Right Ear: External ear normal.     Left Ear: External ear normal.     Nose: Nose normal.     Mouth/Throat:     Mouth: Mucous membranes are moist.     Pharynx: Oropharynx is clear.  Eyes:     Extraocular Movements: Extraocular movements intact.     Conjunctiva/sclera: Conjunctivae normal.     Pupils: Pupils are equal, round, and reactive to light.  Neck:     Comments: Tracheostoma small with granulation. Cardiovascular:     Rate and Rhythm: Normal rate.  Pulmonary:     Effort: Pulmonary effort is normal.  Skin:    General: Skin is warm and dry.  Neurological:     General: No focal deficit present.     Mental Status: He is alert and oriented to person, place, and time.   Psychiatric:        Mood and Affect: Mood normal.        Behavior: Behavior normal.        Thought Content: Thought content normal.        Judgment: Judgment normal.    Assessment/Plan: Tracheostomy status, glottic stenosis  The tracheostoma was dilated and the trach tube replaced.  See the procedure note.  Melida Quitter 11/20/2020, 3:42 AM

## 2020-11-20 NOTE — Procedures (Addendum)
Preop diagnosis: Tracheostomy status, glottic stenosis Postop diagnosis: same Procedure: Dilation of tracheostoma and replacement of tracheostomy tube Surgeon: Redmond Baseman Anesth: None Compl: None Findings: Tracheostoma narrow with granulation tissue. Description:  After discussing risks, benefits, and alternatives, the patient was placed in a reclined position.  Urethral dilators were used serially to widen the stoma.  A #6 fenestrated cuffless Shiley (available tube) was then replaced.  When the #6 flexible Shiley trach arrived, it was exchanged for the other tube without difficulty.  He tolerated the procedure well.

## 2020-11-20 NOTE — Discharge Instructions (Addendum)
Continue your usual tracheostomy care.

## 2020-11-23 ENCOUNTER — Telehealth: Payer: Self-pay

## 2020-11-23 NOTE — Telephone Encounter (Signed)
Transition Care Management Follow-up Telephone Call Date of discharge and from where: Shaun Kennedy 11/20/20 How have you been since you were released from the hospital? fair Any questions or concerns? No  Items Reviewed: Did the pt receive and understand the discharge instructions provided? Yes  Medications obtained and verified? Yes  Other? No  Any new allergies since your discharge? No  Dietary orders reviewed? No Do you have support at home? Yes   Home Care and Equipment/Supplies: Were home health services ordered? no  Functional Questionnaire: (I = Independent and D = Dependent) ADLs: I  Bathing/Dressing- I  Meal Prep- I  Eating- I  Maintaining continence- I  Transferring/Ambulation- I  Managing Meds- I  Follow up appointments reviewed:  PCP Hospital f/u appt confirmed? Yes  Scheduled to see Dr. Redmond School on 11/24/20 @ 1;30. [facility] f/u appt confirmed? No  Are transportation arrangements needed? No  If their condition worsens, is the pt aware to call PCP or go to the Emergency Dept.? Yes Was the patient provided with contact information for the PCP's office or ED? Yes Was to pt encouraged to call back with questions or concerns? Yes

## 2020-11-24 ENCOUNTER — Other Ambulatory Visit: Payer: Self-pay

## 2020-11-24 ENCOUNTER — Ambulatory Visit (INDEPENDENT_AMBULATORY_CARE_PROVIDER_SITE_OTHER): Payer: Medicare HMO | Admitting: Family Medicine

## 2020-11-24 VITALS — BP 132/82 | HR 75 | Temp 96.8°F | Wt 181.2 lb

## 2020-11-24 DIAGNOSIS — Z23 Encounter for immunization: Secondary | ICD-10-CM

## 2020-11-24 DIAGNOSIS — C3432 Malignant neoplasm of lower lobe, left bronchus or lung: Secondary | ICD-10-CM | POA: Diagnosis not present

## 2020-11-24 DIAGNOSIS — I700 Atherosclerosis of aorta: Secondary | ICD-10-CM | POA: Diagnosis not present

## 2020-11-24 DIAGNOSIS — E119 Type 2 diabetes mellitus without complications: Secondary | ICD-10-CM | POA: Diagnosis not present

## 2020-11-24 NOTE — Progress Notes (Signed)
   Subjective:    Patient ID: Shaun Kennedy, male    DOB: February 15, 1940, 80 y.o.   MRN: [id number]  HPI He is here for an interval evaluation.  He recently had cubital tunnel surgery and seems to be recovering quite nicely from that.  He was seen yesterday for recheck.  He also had his ostomy replaced several days ago and accidentally was knocked out.  He seemed to doing quite nicely from that.  He is scheduled to follow-up with Dr. Earlie Server in the near future.  His CT scan was done a week ago.  He did have a hemoglobin A1c done in June.  He has no particular concerns or complaints.  He would like his immunizations updated.   Review of Systems     Objective:   Physical Exam Alert and in no distress.  Tracheostomy tube seems to be in place.  Cardiac exam shows regular rhythm without murmurs or gallops.  Lungs are clear to auscultation.       Assessment & Plan:  Malignant neoplasm of lower lobe of left lung ([place])  Atherosclerosis of aorta ([facility])  Need for influenza vaccination - Plan: Flu Vaccine QUAD High Dose(Fluad)  Immunization, viral disease - Plan: Ambulance person Booster  Type 2 diabetes mellitus without complication, without long-term current use of insulin ([place]) His immunizations are updated.  He is on appropriate meds for his atherosclerosis as well as hyperlipidemia, diabetes and hypertension.  Follow-up on these in a couple of months.

## 2020-11-26 ENCOUNTER — Other Ambulatory Visit: Payer: Self-pay

## 2020-11-26 NOTE — Patient Outreach (Signed)
[facility]) Care Management  11/26/2020  KOLLIN UDELL 20-Oct-1940 [id number]     Telephone Assessment   Successful outreach call to patient. He denies any acute issues or concerns at present. States things have been going fairly well. He denies any RN CM needs or concerns at this time.    Medications Reviewed Today     Reviewed by [name], RN (Registered Nurse) on 11/26/20 at 1045  Med List Status: <None>   Medication Order Taking? Sig Documenting Provider Last Dose Status Informant  Alcohol Swabs (DROPSAFE ALCOHOL PREP) 70 % PADS [id number] No Apply topically. [provider] Taking Active Multiple Informants  amLODipine (NORVASC) 5 MG tablet [id number] No Take 1 tablet (5 mg total) by mouth daily. [name], MD Taking Active Multiple Informants  aspirin EC 81 MG tablet [id number] No Take 81 mg by mouth daily after breakfast.  [provider] Taking Active Multiple Informants  bisacodyl (DULCOLAX) 5 MG EC tablet [id number] No Take 10 mg by mouth daily as needed for moderate constipation. [provider] Taking Active Multiple Informants  Blood Glucose Calibration (TRUE METRIX LEVEL 1) Low SOLN [id number] No  [provider] Taking Active Multiple Informants  dronabinol (MARINOL) 2.5 MG capsule [id number] No Take 1 capsule (2.5 mg total) by mouth 2 (two) times daily before a meal. [name], MD Taking Active Multiple Informants  ferrous sulfate 325 (65 FE) MG EC tablet [id number] No Take 1 tablet (325 mg total) by mouth daily with breakfast. [name], MD Taking Active Multiple Informants  Lancet Devices (TRUEDRAW LANCING DEVICE) MISC [id number] No Check sugars daily [name], MD Taking Active Multiple Informants  lisinopril-hydrochlorothiazide (ZESTORETIC) 20-12.5 MG tablet [id number] No Take 1 tablet by mouth daily. [provider] Taking Active Multiple Informants  metFORMIN  (GLUCOPHAGE-XR) 500 MG 24 hr tablet [id number] No TAKE 1 TABLET EVERY DAY [name], MD Taking Active Multiple Informants  Multiple Vitamin (MULTIVITAMIN WITH MINERALS) TABS tablet [id number] No Take 1 tablet by mouth daily after breakfast.  [provider] Taking Active Multiple Informants  oxyCODONE-acetaminophen (PERCOCET) 5-325 MG tablet [id number] No Take 1 tablet by mouth every 4 (four) hours as needed for severe pain. Charlotte Crumb, MD Taking Active   simvastatin (ZOCOR) 40 MG tablet [id number] No Take 40 mg by mouth daily. [provider] Taking Active Multiple Informants  solifenacin (VESICARE) 5 MG tablet [id number] No TAKE 1 TABLET EVERY DAY [name], MD Taking Active Multiple Informants  TRUE METRIX BLOOD GLUCOSE TEST test strip [id number] No SMARTSIG:Via Meter [provider] Taking Active Multiple Informants  TRUEplus Lancets 33G MISC [id number] No 1 each by Does not apply route 2 (two) times daily. [name], MD Taking Active Multiple Informants             Goals Addressed               This Visit's Progress     (THN)Follow My Treatment Plan-Chemotherapy Adherence (pt-stated)        Timeframe:  Short-Term Goal Priority:  High Start Date:  08/07/2020                           Expected End Date:Jan 2023                       Follow Up Date:Jan 2023   Barriers: Knowledge   - call the  doctor or nurse to get help with side effects - keep a list of all the medicines I take; vitamins and herbals too - keep follow-up appointments    Why is this important?   Following your treatment plan will help keep your care on track.  Medicine may be the most important piece of your plan.  There are many reasons why you might want to stop taking medicine. You may get tired of taking your medicine. You may think medicine costs too much money. You may find the side effects are too much to bear.  Try some of these steps to make following the  treatment plan a little easier.     Notes:   08/07/20-Patient tolerating chemo txs well so far at present. Denies any SEs. He is due for upcoming scans to assess status of cancer within the next few months.   09/21/20-Patient tolerating chemo txs fairly well-no reported SEs except appetite not as good as it was-supplementing with shakes. Spouse inquiring about questions if txs "working and helping" and encouraged her to discuss with oncologist.   10/26/20-Spouse reports that pt continues to tolerate chemo txs well-denies any SEs at present-he is on cycle 9. He is du to have some tests/scan done within the next month.   11/26/2020-Patient reports he continues to tolerate chemo txs. He is unsure of how many txs he has to complete. He voices no SEs or xss from treatment. He had recent ED visit as his trach accidentally became dislodged. Otherwise he has had no issues or concerns.       (THN)Set My Target A1C-Diabetes Type 2 (pt-stated)        Timeframe:  Long-Range Goal Priority:  Medium Start Date: 04/27/2020                            Expected End Date:  Jan 2023                   Follow Up Date Jan 2023    Barriers: Health Behaviors Knowledge   - set target A1C  -monitor cbgs as ordered -complete MD appts as ordered   Why is this important?   Your target A1C is decided together by you and your doctor.  It is based on several things like your age and other health issues.    Notes:  04/27/2020-Patient went for PCP appt today and had A1C level drawn-results pending. Last A1C on file 5.9(Jan 2022).  05/12/2020-Spouse reports appetite gradually increasing-blood sugars WNL. A1C lab result was 6.2(04/27/20)  06/08/20-Caregivers report pt continues to maintain good appetite. Blood sugars have ben in the low to mid 100s.  07/08/2020-Patient reports appetite remains good and WNL for him. Wgt stable. Family monitoring cbgs but unsure of reading this morning-unable to recall. No current labwork on  file.   08/07/20-Patient saw PCP recently and had labwork done. A1C 6.0 down from 6.5. Pt/family continues to monitor cbgs in the home. He voices adherence to diabetic diet.   09/21/20-Spouse reports appetite fair. Patient supplementing by drinking about to Ensures per day. Wgt stable at 175lbs. Blood sugars controlled.   10/26/20-Appetite reported as good. Patient out of strips for meter and has to go to pharmacy to pick it up so he has not checked cbgs in last few days.   11/26/20-Patient reports appetite remains good and WNL for him . Blood sugars stable and ranging in the mid to high 100s.  Plan: RN CM discussed with patient next outreach within the month of Jan. Patient agrees to care plan and follow up.  [name], RN,BSN,CCM [facility] Telephonic Care Management Coordinator Direct Phone: [phone] Toll Free: [phone] Fax: [phone]

## 2020-12-02 ENCOUNTER — Inpatient Hospital Stay: Payer: Medicare HMO

## 2020-12-02 ENCOUNTER — Other Ambulatory Visit: Payer: Self-pay

## 2020-12-02 ENCOUNTER — Encounter: Payer: Self-pay | Admitting: Internal Medicine

## 2020-12-02 ENCOUNTER — Inpatient Hospital Stay (HOSPITAL_BASED_OUTPATIENT_CLINIC_OR_DEPARTMENT_OTHER): Payer: Medicare HMO | Admitting: Internal Medicine

## 2020-12-02 VITALS — BP 111/58 | HR 68 | Temp 97.7°F | Resp 17 | Ht 67.0 in | Wt 178.5 lb

## 2020-12-02 DIAGNOSIS — Z5112 Encounter for antineoplastic immunotherapy: Secondary | ICD-10-CM | POA: Diagnosis not present

## 2020-12-02 DIAGNOSIS — C3432 Malignant neoplasm of lower lobe, left bronchus or lung: Secondary | ICD-10-CM

## 2020-12-02 DIAGNOSIS — Z79899 Other long term (current) drug therapy: Secondary | ICD-10-CM | POA: Diagnosis not present

## 2020-12-02 DIAGNOSIS — Z95828 Presence of other vascular implants and grafts: Secondary | ICD-10-CM

## 2020-12-02 DIAGNOSIS — E119 Type 2 diabetes mellitus without complications: Secondary | ICD-10-CM | POA: Diagnosis not present

## 2020-12-02 DIAGNOSIS — Z7982 Long term (current) use of aspirin: Secondary | ICD-10-CM | POA: Diagnosis not present

## 2020-12-02 DIAGNOSIS — I10 Essential (primary) hypertension: Secondary | ICD-10-CM | POA: Diagnosis not present

## 2020-12-02 DIAGNOSIS — Z7984 Long term (current) use of oral hypoglycemic drugs: Secondary | ICD-10-CM | POA: Diagnosis not present

## 2020-12-02 DIAGNOSIS — Z87891 Personal history of nicotine dependence: Secondary | ICD-10-CM | POA: Diagnosis not present

## 2020-12-02 LAB — CBC WITH DIFFERENTIAL (CANCER CENTER ONLY)
Abs Immature Granulocytes: 0 10*3/uL (ref 0.00–0.07)
Basophils Absolute: 0 10*3/uL (ref 0.0–0.1)
Basophils Relative: 0 %
Eosinophils Absolute: 0.4 10*3/uL (ref 0.0–0.5)
Eosinophils Relative: 9 %
HCT: 30 % — ABNORMAL LOW (ref 39.0–52.0)
Hemoglobin: 10.1 g/dL — ABNORMAL LOW (ref 13.0–17.0)
Immature Granulocytes: 0 %
Lymphocytes Relative: 25 %
Lymphs Abs: 1.2 10*3/uL (ref 0.7–4.0)
MCH: 31.2 pg (ref 26.0–34.0)
MCHC: 33.7 g/dL (ref 30.0–36.0)
MCV: 92.6 fL (ref 80.0–100.0)
Monocytes Absolute: 0.4 10*3/uL (ref 0.1–1.0)
Monocytes Relative: 8 %
Neutro Abs: 2.7 10*3/uL (ref 1.7–7.7)
Neutrophils Relative %: 58 %
Platelet Count: 265 10*3/uL (ref 150–400)
RBC: 3.24 MIL/uL — ABNORMAL LOW (ref 4.22–5.81)
RDW: 12.3 % (ref 11.5–15.5)
WBC Count: 4.7 10*3/uL (ref 4.0–10.5)
nRBC: 0 % (ref 0.0–0.2)

## 2020-12-02 LAB — CMP (CANCER CENTER ONLY)
ALT: 9 U/L (ref 0–44)
AST: 14 U/L — ABNORMAL LOW (ref 15–41)
Albumin: 3.4 g/dL — ABNORMAL LOW (ref 3.5–5.0)
Alkaline Phosphatase: 61 U/L (ref 38–126)
Anion gap: 9 (ref 5–15)
BUN: 20 mg/dL (ref 8–23)
CO2: 24 mmol/L (ref 22–32)
Calcium: 9.2 mg/dL (ref 8.9–10.3)
Chloride: 107 mmol/L (ref 98–111)
Creatinine: 1.3 mg/dL — ABNORMAL HIGH (ref 0.61–1.24)
GFR, Estimated: 55 mL/min — ABNORMAL LOW (ref >60)
Glucose, Bld: 93 mg/dL (ref 70–99)
Potassium: 4.1 mmol/L (ref 3.5–5.1)
Sodium: 140 mmol/L (ref 135–145)
Total Bilirubin: 0.6 mg/dL (ref 0.3–1.2)
Total Protein: 7.9 g/dL (ref 6.5–8.1)

## 2020-12-02 LAB — TSH: TSH: 1.915 u[IU]/mL (ref 0.320–4.118)

## 2020-12-02 MED ORDER — SODIUM CHLORIDE 0.9 % IV SOLN
360.0000 mg | Freq: Once | INTRAVENOUS | Status: AC
  Administered 2020-12-02: 360 mg via INTRAVENOUS
  Filled 2020-12-02: qty 24

## 2020-12-02 MED ORDER — SODIUM CHLORIDE 0.9% FLUSH
10.0000 mL | INTRAVENOUS | Status: DC | PRN
  Administered 2020-12-02: 10 mL

## 2020-12-02 MED ORDER — HEPARIN SOD (PORK) LOCK FLUSH 100 UNIT/ML IV SOLN
500.0000 [IU] | Freq: Once | INTRAVENOUS | Status: AC | PRN
  Administered 2020-12-02: 500 [IU]

## 2020-12-02 MED ORDER — SODIUM CHLORIDE 0.9 % IV SOLN: Freq: Once | INTRAVENOUS | Status: AC

## 2020-12-02 MED ORDER — SODIUM CHLORIDE 0.9% FLUSH: 10.0000 mL | Freq: Once | INTRAVENOUS | Status: DC

## 2020-12-02 NOTE — Patient Instructions (Signed)
[facility]  Discharge Instructions: Thank you for choosing [facility] to provide your oncology and hematology care.   If you have a lab appointment with the [facility], please go directly to the [facility] and check in at the registration area.   Wear comfortable clothing and clothing appropriate for easy access to any Portacath or PICC line.   We strive to give you quality time with your provider. You may need to reschedule your appointment if you arrive late (15 or more minutes).  Arriving late affects you and other patients whose appointments are after yours.  Also, if you miss three or more appointments without notifying the office, you may be dismissed from the clinic at the provider's discretion.      For prescription refill requests, have your pharmacy contact our office and allow 72 hours for refills to be completed.    Today you received the following chemotherapy and/or immunotherapy agents opdivo      To help prevent nausea and vomiting after your treatment, we encourage you to take your nausea medication as directed.  BELOW ARE SYMPTOMS THAT SHOULD BE REPORTED IMMEDIATELY: *FEVER GREATER THAN 100.4 F (38 C) OR HIGHER *CHILLS OR SWEATING *NAUSEA AND VOMITING THAT IS NOT CONTROLLED WITH YOUR NAUSEA MEDICATION *UNUSUAL SHORTNESS OF BREATH *UNUSUAL BRUISING OR BLEEDING *URINARY PROBLEMS (pain or burning when urinating, or frequent urination) *BOWEL PROBLEMS (unusual diarrhea, constipation, pain near the anus) TENDERNESS IN MOUTH AND THROAT WITH OR WITHOUT PRESENCE OF ULCERS (sore throat, sores in mouth, or a toothache) UNUSUAL RASH, SWELLING OR PAIN  UNUSUAL VAGINAL DISCHARGE OR ITCHING   Items with * indicate a potential emergency and should be followed up as soon as possible or go to the Emergency Department if any problems should occur.  Please show the CHEMOTHERAPY ALERT CARD or IMMUNOTHERAPY ALERT CARD at check-in to the  Emergency Department and triage nurse.  Should you have questions after your visit or need to cancel or reschedule your appointment, please contact [facility]  Dept: [phone]  and follow the prompts.  Office hours are 8:00 a.m. to 4:30 p.m. Monday - Friday. Please note that voicemails left after 4:00 p.m. may not be returned until the following business day.  We are closed weekends and major holidays. You have access to a nurse at all times for urgent questions. Please call the main number to the clinic Dept: [phone] and follow the prompts.   For any non-urgent questions, you may also contact your provider using MyChart. We now offer e-Visits for anyone 55 and older to request care online for non-urgent symptoms. For details visit [email or web address].   Also download the MyChart app! Go to the app store, search "MyChart", open the app, select [facility], and log in with your MyChart username and password.  Due to Covid, a mask is required upon entering the hospital/clinic. If you do not have a mask, one will be given to you upon arrival. For doctor visits, patients may have 1 support person aged 15 or older with them. For treatment visits, patients cannot have anyone with them due to current Covid guidelines and our immunocompromised population.

## 2020-12-02 NOTE — Progress Notes (Signed)
[place] Telephone:([phone]) [phone]   Fax:(336) [phone]  OFFICE PROGRESS NOTE  [name], [place] [address]  DIAGNOSIS: stage IV (T3, N2, M1 a) non-small cell lung cancer, squamous cell carcinoma presented with obstructive left lower lobe lung mass in addition to mediastinal lymphadenopathy as well as bilateral pulmonary nodules diagnosed in July 2021.   PDL1: 0%   PRIOR THERAPY: None   CURRENT THERAPY: 1) Palliative radiotherapy to the obstructive left lower lobe lung mass under the care of Dr. Lisbeth Renshaw.  2)  systemic chemotherapy 2 cycles of chemotherapy with carboplatin for an AUC of 5 and paclitaxel 175 mg/m2 in addition to immunotherapy with nivolumab 360 mg every 3 weeks and ipilimumab 1 mg/kg IV every 6 weeks followed by maintenance nivolumab and ipilimumab.  He started the first treatment on 09/04/2019.  He is status post 10 cycles of the treatment.  INTERVAL HISTORY: Shaun Kennedy 80 y.o. male returns to the clinic today for follow-up visit.  The patient is feeling fine today with no concerning complaints except for some issues with the tracheostomy tube and occasional obstruction by food debris.  He is followed by Dr. Redmond Baseman with ENT.  He denied having any current chest pain, shortness of breath except with exertion with no cough or hemoptysis.  He denied having any fever or chills.  He has no nausea, vomiting, diarrhea or constipation.  He has no headache or visual changes.  He denied having any significant weight loss or night sweats.  He continues to tolerate his treatment with ipilimumab and nivolumab fairly well.  The patient had repeat CT scan of the chest, abdomen and pelvis 2 weeks ago and he is here for evaluation and discussion of his scan results before starting day 22 of cycle #11.  MEDICAL HISTORY: Past Medical History:  Diagnosis Date   Allergic rhinitis    Asthma    Carotid stenosis    Colon cancer ([place]) 2003    Diabetes mellitus ([place])    Diverticulosis    Dyslipidemia    ED (erectile dysfunction)    GERD (gastroesophageal reflux disease)    H/O degenerative disc disease    Hemorrhoids    HTN (hypertension)    as a child   Hyperlipidemia    Lung cancer ([place])    LVH (left ventricular hypertrophy)    on EKG   Mass of lower lobe of left lung    Mediastinal adenopathy    Smoker    former   Wears dentures    Wears dentures    Wears glasses    Wears glasses     ALLERGIES:  has No Known Allergies.  MEDICATIONS:  Current Outpatient Medications  Medication Sig Dispense Refill   Alcohol Swabs (DROPSAFE ALCOHOL PREP) 70 % PADS Apply topically.     amLODipine (NORVASC) 5 MG tablet Take 1 tablet (5 mg total) by mouth daily. 90 tablet 3   aspirin EC 81 MG tablet Take 81 mg by mouth daily after breakfast.      bisacodyl (DULCOLAX) 5 MG EC tablet Take 10 mg by mouth daily as needed for moderate constipation.     Blood Glucose Calibration (TRUE METRIX LEVEL 1) Low SOLN      dronabinol (MARINOL) 2.5 MG capsule Take 1 capsule (2.5 mg total) by mouth 2 (two) times daily before a meal. 60 capsule 1   ferrous sulfate 325 (65 FE) MG EC tablet Take 1 tablet (325  mg total) by mouth daily with breakfast. 30 tablet 12   Lancet Devices (TRUEDRAW LANCING DEVICE) MISC Check sugars daily 90 each 0   lisinopril-hydrochlorothiazide (ZESTORETIC) 20-12.5 MG tablet Take 1 tablet by mouth daily.     metFORMIN (GLUCOPHAGE-XR) 500 MG 24 hr tablet TAKE 1 TABLET EVERY DAY 90 tablet 1   Multiple Vitamin (MULTIVITAMIN WITH MINERALS) TABS tablet Take 1 tablet by mouth daily after breakfast.      oxyCODONE-acetaminophen (PERCOCET) 5-325 MG tablet Take 1 tablet by mouth every 4 (four) hours as needed for severe pain. 20 tablet 0   simvastatin (ZOCOR) 40 MG tablet Take 40 mg by mouth daily.     solifenacin (VESICARE) 5 MG tablet TAKE 1 TABLET EVERY DAY 90 tablet 1   TRUE METRIX BLOOD GLUCOSE TEST test strip SMARTSIG:Via Meter      TRUEplus Lancets 33G MISC 1 each by Does not apply route 2 (two) times daily. 200 each 4   No current facility-administered medications for this visit.   Facility-Administered Medications Ordered in Other Visits  Medication Dose Route Frequency Provider Last Rate Last Admin   sodium chloride flush (NS) 0.9 % injection 10 mL  10 mL Intracatheter PRN Curt Bears, MD   10 mL at 09/04/19 1556   sodium chloride flush (NS) 0.9 % injection 10 mL  10 mL Intracatheter Once Curt Bears, MD        SURGICAL HISTORY:  Past Surgical History:  Procedure Laterality Date   BRONCHIAL BIOPSY  08/20/2019   Procedure: BRONCHIAL BIOPSIES;  Surgeon: Collene Gobble, MD;  Location[facility] END[facility]COPY;  Service: Pulmonary;;   BRONCHIAL BRUSHINGS  08/20/2019   Procedure: BRONCHIAL BRUSHINGS;  Surgeon: Collene Gobble, MD;  Location: [facility] ENDOSCOPY;  Service: Pulmonary;;   BRONCHIAL NEEDLE ASPIRATION BIOPSY  08/20/2019   Procedure: BRONCHIAL NEEDLE ASPIRATION BIOPSIES;  Surgeon: Collene Gobble, MD;  Location: [facility] ENDOSCOPY;  Service: Pulmonary;;   CARPAL TUNNEL RELEASE Right 01/09/2019   Procedure: CARPAL TUNNEL RELEASE;  Surgeon: Leandrew Koyanagi, MD;  Location: [place];  Service: Orthopedics;  Laterality: Right;   CARPAL TUNNEL WITH CUBITAL TUNNEL Right 11/18/2020   Procedure: CARPAL TUNNEL WITH CUBITAL TUNNEL;  Surgeon: Charlotte Crumb, MD;  Location: [place];  Service: Orthopedics;  Laterality: Right;   CATARACT EXTRACTION Right 2018   COLONOSCOPY  2007   Dr. Benson Norway   DIRECT LARYNGOSCOPY N/A 04/10/2020   Procedure: DIRECT LARYNGOSCOPY;  Surgeon: Melida Quitter, MD;  Location: [place];  Service: ENT;  Laterality: N/A;   I & D EXTREMITY Right 04/02/2016   Procedure: IRRIGATION AND DEBRIDEMENT GREAT TOE;  Surgeon: Leandrew Koyanagi, MD;  Location: [place];  Service: Orthopedics;  Laterality: Right;   IR IMAGING GUIDED PORT INSERTION  08/30/2019   MULTIPLE TOOTH EXTRACTIONS     RADIOACTIVE SEED IMPLANT  2003    TRACHEOSTOMY TUBE PLACEMENT N/A 04/10/2020   Procedure: TRACHEOSTOMY;  Surgeon: Melida Quitter, MD;  Location: [place];  Service: ENT;  Laterality: N/A;   ULNAR TUNNEL RELEASE Right 01/09/2019   Procedure: RIGHT CUBITAL TUNNEL RELEASE AND CARPAL TUNNEL RELEASE;  Surgeon: Leandrew Koyanagi, MD;  Location: [place];  Service: Orthopedics;  Laterality: Right;   UPPER GASTROINTESTINAL ENDOSCOPY     VIDEO BRONCHOSCOPY N/A 12/18/2019   Procedure: VIDEO BRONCHOSCOPY WITHOUT FLUORO;  Surgeon: Collene Gobble, MD;  Location: [facility] ENDOSCOPY;  Service: Cardiopulmonary;  Laterality: N/A;   VIDEO BRONCHOSCOPY WITH ENDOBRONCHIAL ULTRASOUND N/A 08/20/2019   Procedure: VIDEO  BRONCHOSCOPY WITH ENDOBRONCHIAL ULTRASOUND;  Surgeon: Collene Gobble, MD;  Location[facility] ENDOSCOPY;  Service: Pulmonary;  Laterality: N/A;    REVIEW OF SYSTEMS:  Constitutional: positive for fatigue Eyes: negative Ears, nose, mouth, throat, and face: positive for hoarseness Respiratory: negative Cardiovascular: negative Gastrointestinal: negative Genitourinary:negative Integument/breast: negative Hematologic/lymphatic: negative Musculoskeletal:negative Neurological: negative Behavioral/Psych: negative Endocrine: negative Allergic/Immunologic: negative   PHYSICAL EXAMINATION: General appearance: alert, cooperative, and no distress Head: Normocephalic, without obvious abnormality, atraumatic Neck: no adenopathy, no JVD, supple, symmetrical, trachea midline, and thyroid not enlarged, symmetric, no tenderness/mass/nodules Lymph nodes: Cervical, supraclavicular, and axillary nodes normal. Resp: clear to auscultation bilaterally Back: symmetric, no curvature. ROM normal. No CVA tenderness. Cardio: regular rate and rhythm, S1, S2 normal, no murmur, click, rub or gallop GI: soft, non-tender; bowel sounds normal; no masses,  no organomegaly Extremities: extremities normal, atraumatic, no cyanosis or edema Neurologic: Alert and  oriented X 3, normal strength and tone. Normal symmetric reflexes. Normal coordination and gait  ECOG PERFORMANCE STATUS: 1 - Symptomatic but completely ambulatory  Blood pressure (!) 111/58, pulse 68, temperature 97.7 F (36.5 C), temperature source Oral, resp. rate 17, height '5\' 7"'  (1.702 m), weight 178 lb 8 oz (81 kg), SpO2 99 %.  LABORATORY DATA: Lab Results  Component Value Date   WBC 5.4 11/12/2020   HGB 9.8 (L) 11/12/2020   HCT 28.5 (L) 11/12/2020   MCV 91.9 11/12/2020   PLT 267 11/12/2020      Chemistry      Component Value Date/Time   NA 140 11/12/2020 1158   NA 132 (L) 08/14/2019 0918   K 3.6 11/12/2020 1158   CL 106 11/12/2020 1158   CO2 27 11/12/2020 1158   BUN 20 11/12/2020 1158   BUN 16 08/14/2019 0918   CREATININE 1.37 (H) 11/12/2020 1158   CREATININE 1.25 (H) 08/08/2016 0912      Component Value Date/Time   CALCIUM 9.2 11/12/2020 1158   ALKPHOS 62 11/12/2020 1158   AST 18 11/12/2020 1158   ALT 13 11/12/2020 1158   BILITOT 0.5 11/12/2020 1158       RADIOGRAPHIC STUDIES: CT Chest W Contrast  Result Date: 11/18/2020 CLINICAL DATA:  Metastatic squamous cell carcinoma restaging EXAM: CT CHEST, ABDOMEN, AND PELVIS WITH CONTRAST TECHNIQUE: Multidetector CT imaging of the chest, abdomen and pelvis was performed following the standard protocol during bolus administration of intravenous contrast. CONTRAST:  78m OMNIPAQUE IOHEXOL 350 MG/ML SOLN, additional oral enteric contrast COMPARISON:  08/19/2020 FINDINGS: CT CHEST FINDINGS Cardiovascular: Right chest port catheter. Aortic atherosclerosis. Normal heart size. Three-vessel coronary artery calcifications. No pericardial effusion. Mediastinum/Nodes: Interval decrease in size of subcarinal lymph nodes, measuring 2.5 x 1.3 cm, previously 2.9 x 1.9 cm (series 2, image 26). Thyroid gland, trachea, and esophagus demonstrate no significant findings. Lungs/Pleura: Tracheostomy. Mild centrilobular and paraseptal emphysema.  Diffuse bilateral bronchial wall thickening. Unchanged post treatment appearance of the left lung, with perihilar, paramedian left upper lobe, and superior segment left lower lobe fibrosis and consolidation. Unchanged small pulmonary nodules scattered throughout the lungs, the largest a 0.6 cm nodule of the dependent left lower lobe (series 4, image 76), also including a 0.5 cm nodule of the lateral segment right middle lobe (series 4, image 74). Unchanged small left pleural effusion. Musculoskeletal: No chest wall mass or suspicious bone lesions identified. CT ABDOMEN PELVIS FINDINGS Hepatobiliary: No solid liver abnormality is seen. No gallstones, gallbladder wall thickening, or biliary dilatation. Pancreas: Unremarkable. No pancreatic ductal dilatation or surrounding inflammatory changes. Spleen:  Normal in size without significant abnormality. Adrenals/Urinary Tract: Adrenal glands are unremarkable. Kidneys are normal, without renal calculi, solid lesion, or hydronephrosis. Bladder is unremarkable. Stomach/Bowel: Stomach is within normal limits. Appendix appears normal. No evidence of bowel wall thickening, distention, or inflammatory changes. Large burden of stool throughout the colon and rectum. Descending and sigmoid diverticulosis. Vascular/Lymphatic: Aortic atherosclerosis. No enlarged abdominal or pelvic lymph nodes. Reproductive: Prostate brachytherapy pellets. Other: Bilateral inguinal hernias, containing a single loop of nonobstructed distal small bowel on the right and fat on the left (series 2, image 111). No abdominopelvic ascites. Musculoskeletal: No acute or significant osseous findings. IMPRESSION: 1. Unchanged post treatment appearance of the left lung, with perihilar, paramedian left upper lobe, and superior segment left lower lobe fibrosis and consolidation. 2. Unchanged small pulmonary nodules scattered throughout the lungs. Continued attention on follow-up. 3. Interval decrease in size of  subcarinal lymph nodes, consistent with treatment response. 4. Unchanged small left pleural effusion. 5. No evidence of metastatic disease in the abdomen or pelvis. 6. Prostate brachytherapy. 7. Coronary artery disease. Aortic Atherosclerosis (ICD10-I70.0) and Emphysema (ICD10-J43.9). Electronically Signed   By: Delanna Ahmadi M.D.   On: 11/18/2020 09:14   CT Abdomen Pelvis W Contrast  Result Date: 11/18/2020 CLINICAL DATA:  Metastatic squamous cell carcinoma restaging EXAM: CT CHEST, ABDOMEN, AND PELVIS WITH CONTRAST TECHNIQUE: Multidetector CT imaging of the chest, abdomen and pelvis was performed following the standard protocol during bolus administration of intravenous contrast. CONTRAST:  39m OMNIPAQUE IOHEXOL 350 MG/ML SOLN, additional oral enteric contrast COMPARISON:  08/19/2020 FINDINGS: CT CHEST FINDINGS Cardiovascular: Right chest port catheter. Aortic atherosclerosis. Normal heart size. Three-vessel coronary artery calcifications. No pericardial effusion. Mediastinum/Nodes: Interval decrease in size of subcarinal lymph nodes, measuring 2.5 x 1.3 cm, previously 2.9 x 1.9 cm (series 2, image 26). Thyroid gland, trachea, and esophagus demonstrate no significant findings. Lungs/Pleura: Tracheostomy. Mild centrilobular and paraseptal emphysema. Diffuse bilateral bronchial wall thickening. Unchanged post treatment appearance of the left lung, with perihilar, paramedian left upper lobe, and superior segment left lower lobe fibrosis and consolidation. Unchanged small pulmonary nodules scattered throughout the lungs, the largest a 0.6 cm nodule of the dependent left lower lobe (series 4, image 76), also including a 0.5 cm nodule of the lateral segment right middle lobe (series 4, image 74). Unchanged small left pleural effusion. Musculoskeletal: No chest wall mass or suspicious bone lesions identified. CT ABDOMEN PELVIS FINDINGS Hepatobiliary: No solid liver abnormality is seen. No gallstones, gallbladder  wall thickening, or biliary dilatation. Pancreas: Unremarkable. No pancreatic ductal dilatation or surrounding inflammatory changes. Spleen: Normal in size without significant abnormality. Adrenals/Urinary Tract: Adrenal glands are unremarkable. Kidneys are normal, without renal calculi, solid lesion, or hydronephrosis. Bladder is unremarkable. Stomach/Bowel: Stomach is within normal limits. Appendix appears normal. No evidence of bowel wall thickening, distention, or inflammatory changes. Large burden of stool throughout the colon and rectum. Descending and sigmoid diverticulosis. Vascular/Lymphatic: Aortic atherosclerosis. No enlarged abdominal or pelvic lymph nodes. Reproductive: Prostate brachytherapy pellets. Other: Bilateral inguinal hernias, containing a single loop of nonobstructed distal small bowel on the right and fat on the left (series 2, image 111). No abdominopelvic ascites. Musculoskeletal: No acute or significant osseous findings. IMPRESSION: 1. Unchanged post treatment appearance of the left lung, with perihilar, paramedian left upper lobe, and superior segment left lower lobe fibrosis and consolidation. 2. Unchanged small pulmonary nodules scattered throughout the lungs. Continued attention on follow-up. 3. Interval decrease in size of subcarinal lymph nodes, consistent with treatment response. 4. Unchanged  small left pleural effusion. 5. No evidence of metastatic disease in the abdomen or pelvis. 6. Prostate brachytherapy. 7. Coronary artery disease. Aortic Atherosclerosis (ICD10-I70.0) and Emphysema (ICD10-J43.9). Electronically Signed   By: Delanna Ahmadi M.D.   On: 11/18/2020 09:14    ASSESSMENT AND PLAN: This is a very pleasant 80 years old [name] with stage IV non-small cell lung cancer, squamous cell carcinoma with negative PD-L1 expression. The patient is currently undergoing systemic chemotherapy with carboplatin and paclitaxel for 2 cycles in addition to immunotherapy  with ipilimumab 1 mg/KG every 6 weeks and nivolumab 360 mg IV every 3 weeks status post 10 cycles. The patient has been tolerating his treatment with this regimen fairly well with no concerning adverse effects. He had repeat CT scan of the chest, abdomen and pelvis performed recently.  I personally and independently reviewed the scans and discussed the results with the patient today. His scan showed no concerning findings for disease progression and there was further decrease in the size of the subcarinal lymph node. I recommended for him to continue his current treatment with ipilimumab and nivolumab and he will proceed with day #22 of cycle #11 as planned today. I will see him back for follow-up visit in 3 weeks for evaluation before the next cycle of his treatment. The patient was advised to call immediately if he has any other concerning symptoms in the interval.  The patient voices understanding of current disease status and treatment options and is in agreement with the current care plan.  All questions were answered. The patient knows to call the clinic with any problems, questions or concerns. We can certainly see the patient much sooner if necessary.  Disclaimer: This note was dictated with voice recognition software. Similar sounding words can inadvertently be transcribed and may not be corrected upon review.

## 2020-12-03 ENCOUNTER — Telehealth: Payer: Self-pay | Admitting: Internal Medicine

## 2020-12-03 NOTE — Telephone Encounter (Signed)
Scheduled follow-up appointments per 10/26 los. Patient's wife is aware.

## 2020-12-16 DIAGNOSIS — J386 Stenosis of larynx: Secondary | ICD-10-CM | POA: Diagnosis not present

## 2020-12-16 DIAGNOSIS — J383 Other diseases of vocal cords: Secondary | ICD-10-CM | POA: Diagnosis not present

## 2020-12-16 DIAGNOSIS — C3432 Malignant neoplasm of lower lobe, left bronchus or lung: Secondary | ICD-10-CM | POA: Diagnosis not present

## 2020-12-16 DIAGNOSIS — Z930 Tracheostomy status: Secondary | ICD-10-CM | POA: Diagnosis not present

## 2020-12-23 ENCOUNTER — Inpatient Hospital Stay: Payer: Medicare HMO | Admitting: Internal Medicine

## 2020-12-23 ENCOUNTER — Inpatient Hospital Stay: Payer: Medicare HMO | Attending: Internal Medicine

## 2020-12-23 ENCOUNTER — Other Ambulatory Visit: Payer: Self-pay

## 2020-12-23 ENCOUNTER — Encounter: Payer: Self-pay | Admitting: Internal Medicine

## 2020-12-23 ENCOUNTER — Inpatient Hospital Stay: Payer: Medicare HMO

## 2020-12-23 VITALS — BP 155/84 | HR 74 | Temp 97.3°F | Resp 19 | Ht 67.0 in | Wt 179.2 lb

## 2020-12-23 DIAGNOSIS — Z7984 Long term (current) use of oral hypoglycemic drugs: Secondary | ICD-10-CM | POA: Insufficient documentation

## 2020-12-23 DIAGNOSIS — Z5112 Encounter for antineoplastic immunotherapy: Secondary | ICD-10-CM | POA: Insufficient documentation

## 2020-12-23 DIAGNOSIS — C3432 Malignant neoplasm of lower lobe, left bronchus or lung: Secondary | ICD-10-CM

## 2020-12-23 DIAGNOSIS — J45909 Unspecified asthma, uncomplicated: Secondary | ICD-10-CM | POA: Diagnosis not present

## 2020-12-23 DIAGNOSIS — Z79899 Other long term (current) drug therapy: Secondary | ICD-10-CM | POA: Diagnosis not present

## 2020-12-23 DIAGNOSIS — Z95828 Presence of other vascular implants and grafts: Secondary | ICD-10-CM

## 2020-12-23 DIAGNOSIS — E119 Type 2 diabetes mellitus without complications: Secondary | ICD-10-CM | POA: Insufficient documentation

## 2020-12-23 DIAGNOSIS — Z87891 Personal history of nicotine dependence: Secondary | ICD-10-CM | POA: Insufficient documentation

## 2020-12-23 DIAGNOSIS — I10 Essential (primary) hypertension: Secondary | ICD-10-CM | POA: Insufficient documentation

## 2020-12-23 DIAGNOSIS — E785 Hyperlipidemia, unspecified: Secondary | ICD-10-CM | POA: Insufficient documentation

## 2020-12-23 DIAGNOSIS — Z7982 Long term (current) use of aspirin: Secondary | ICD-10-CM | POA: Insufficient documentation

## 2020-12-23 LAB — CBC WITH DIFFERENTIAL (CANCER CENTER ONLY)
Abs Immature Granulocytes: 0.01 10*3/uL (ref 0.00–0.07)
Basophils Absolute: 0 10*3/uL (ref 0.0–0.1)
Basophils Relative: 0 %
Eosinophils Absolute: 0.4 10*3/uL (ref 0.0–0.5)
Eosinophils Relative: 8 %
HCT: 32.5 % — ABNORMAL LOW (ref 39.0–52.0)
Hemoglobin: 11 g/dL — ABNORMAL LOW (ref 13.0–17.0)
Immature Granulocytes: 0 %
Lymphocytes Relative: 34 %
Lymphs Abs: 1.8 10*3/uL (ref 0.7–4.0)
MCH: 31.4 pg (ref 26.0–34.0)
MCHC: 33.8 g/dL (ref 30.0–36.0)
MCV: 92.9 fL (ref 80.0–100.0)
Monocytes Absolute: 0.6 10*3/uL (ref 0.1–1.0)
Monocytes Relative: 10 %
Neutro Abs: 2.5 10*3/uL (ref 1.7–7.7)
Neutrophils Relative %: 48 %
Platelet Count: 249 10*3/uL (ref 150–400)
RBC: 3.5 MIL/uL — ABNORMAL LOW (ref 4.22–5.81)
RDW: 12.5 % (ref 11.5–15.5)
WBC Count: 5.3 10*3/uL (ref 4.0–10.5)
nRBC: 0 % (ref 0.0–0.2)

## 2020-12-23 LAB — CMP (CANCER CENTER ONLY)
ALT: 10 U/L (ref 0–44)
AST: 13 U/L — ABNORMAL LOW (ref 15–41)
Albumin: 3.5 g/dL (ref 3.5–5.0)
Alkaline Phosphatase: 61 U/L (ref 38–126)
Anion gap: 9 (ref 5–15)
BUN: 17 mg/dL (ref 8–23)
CO2: 24 mmol/L (ref 22–32)
Calcium: 8.9 mg/dL (ref 8.9–10.3)
Chloride: 106 mmol/L (ref 98–111)
Creatinine: 1.29 mg/dL — ABNORMAL HIGH (ref 0.61–1.24)
GFR, Estimated: 55 mL/min — ABNORMAL LOW (ref >60)
Glucose, Bld: 108 mg/dL — ABNORMAL HIGH (ref 70–99)
Potassium: 4 mmol/L (ref 3.5–5.1)
Sodium: 139 mmol/L (ref 135–145)
Total Bilirubin: 0.7 mg/dL (ref 0.3–1.2)
Total Protein: 7.8 g/dL (ref 6.5–8.1)

## 2020-12-23 LAB — TSH: TSH: 1.869 u[IU]/mL (ref 0.320–4.118)

## 2020-12-23 MED ORDER — SODIUM CHLORIDE 0.9% FLUSH
10.0000 mL | Freq: Once | INTRAVENOUS | Status: AC
Start: 2020-12-23 — End: 2020-12-23
  Administered 2020-12-23: 10 mL

## 2020-12-23 MED ORDER — FAMOTIDINE 20 MG IN NS 100 ML IVPB
20.0000 mg | Freq: Once | INTRAVENOUS | Status: AC
  Administered 2020-12-23: 20 mg via INTRAVENOUS
  Filled 2020-12-23: qty 100

## 2020-12-23 MED ORDER — DIPHENHYDRAMINE HCL 50 MG/ML IJ SOLN
25.0000 mg | Freq: Once | INTRAMUSCULAR | Status: AC
  Administered 2020-12-23: 25 mg via INTRAVENOUS
  Filled 2020-12-23: qty 1

## 2020-12-23 MED ORDER — HEPARIN SOD (PORK) LOCK FLUSH 100 UNIT/ML IV SOLN
500.0000 [IU] | Freq: Once | INTRAVENOUS | Status: AC | PRN
  Administered 2020-12-23: 500 [IU]

## 2020-12-23 MED ORDER — SODIUM CHLORIDE 0.9 % IV SOLN: Freq: Once | INTRAVENOUS | Status: AC

## 2020-12-23 MED ORDER — SODIUM CHLORIDE 0.9 % IV SOLN
360.0000 mg | Freq: Once | INTRAVENOUS | Status: AC
  Administered 2020-12-23: 360 mg via INTRAVENOUS
  Filled 2020-12-23: qty 24

## 2020-12-23 MED ORDER — SODIUM CHLORIDE 0.9% FLUSH
10.0000 mL | INTRAVENOUS | Status: DC | PRN
  Administered 2020-12-23: 10 mL

## 2020-12-23 MED ORDER — SODIUM CHLORIDE 0.9 % IV SOLN
1.0000 mg/kg | Freq: Once | INTRAVENOUS | Status: AC
  Administered 2020-12-23: 80 mg via INTRAVENOUS
  Filled 2020-12-23: qty 16

## 2020-12-23 NOTE — Progress Notes (Signed)
[place] Telephone:([phone]) [phone]   Fax:(336) [phone]  OFFICE PROGRESS NOTE  [name], [place] [address]  DIAGNOSIS: stage IV (T3, N2, M1 a) non-small cell lung cancer, squamous cell carcinoma presented with obstructive left lower lobe lung mass in addition to mediastinal lymphadenopathy as well as bilateral pulmonary nodules diagnosed in July 2021.   PDL1: 0%   PRIOR THERAPY: None   CURRENT THERAPY: 1) Palliative radiotherapy to the obstructive left lower lobe lung mass under the care of Dr. Lisbeth Renshaw.  2)  systemic chemotherapy 2 cycles of chemotherapy with carboplatin for an AUC of 5 and paclitaxel 175 mg/m2 in addition to immunotherapy with nivolumab 360 mg every 3 weeks and ipilimumab 1 mg/kg IV every 6 weeks followed by maintenance nivolumab and ipilimumab.  He started the first treatment on 09/04/2019.  He is status post 11 cycles of the treatment.  INTERVAL HISTORY: Shaun Kennedy 80 y.o. male returns to the clinic today for follow-up visit.  The patient is feeling fine today with no concerning complaints.  He denied having any chest pain, shortness of breath except with exertion with no cough or hemoptysis.  He denied having any fever or chills.  He has no nausea, vomiting, diarrhea or constipation.  He has no headache or visual changes.  He has no recent weight loss or night sweats.  He continues to tolerate his treatment with immunotherapy fairly well.  The patient is here today for evaluation before starting cycle #12 of his treatment.  MEDICAL HISTORY: Past Medical History:  Diagnosis Date   Allergic rhinitis    Asthma    Carotid stenosis    Colon cancer ([place]) 2003   Diabetes mellitus ([place])    Diverticulosis    Dyslipidemia    ED (erectile dysfunction)    GERD (gastroesophageal reflux disease)    H/O degenerative disc disease    Hemorrhoids    HTN (hypertension)    as a child   Hyperlipidemia    Lung cancer  ([place])    LVH (left ventricular hypertrophy)    on EKG   Mass of lower lobe of left lung    Mediastinal adenopathy    Smoker    former   Wears dentures    Wears dentures    Wears glasses    Wears glasses     ALLERGIES:  has No Known Allergies.  MEDICATIONS:  Current Outpatient Medications  Medication Sig Dispense Refill   Alcohol Swabs (DROPSAFE ALCOHOL PREP) 70 % PADS Apply topically.     amLODipine (NORVASC) 5 MG tablet Take 1 tablet (5 mg total) by mouth daily. 90 tablet 3   aspirin EC 81 MG tablet Take 81 mg by mouth daily after breakfast.      bisacodyl (DULCOLAX) 5 MG EC tablet Take 10 mg by mouth daily as needed for moderate constipation.     Blood Glucose Calibration (TRUE METRIX LEVEL 1) Low SOLN      dronabinol (MARINOL) 2.5 MG capsule Take 1 capsule (2.5 mg total) by mouth 2 (two) times daily before a meal. 60 capsule 1   ferrous sulfate 325 (65 FE) MG EC tablet Take 1 tablet (325 mg total) by mouth daily with breakfast. 30 tablet 12   Lancet Devices (TRUEDRAW LANCING DEVICE) MISC Check sugars daily 90 each 0   lisinopril-hydrochlorothiazide (ZESTORETIC) 20-12.5 MG tablet Take 1 tablet by mouth daily.     metFORMIN (GLUCOPHAGE-XR) 500 MG 24 hr  tablet TAKE 1 TABLET EVERY DAY 90 tablet 1   Multiple Vitamin (MULTIVITAMIN WITH MINERALS) TABS tablet Take 1 tablet by mouth daily after breakfast.      oxyCODONE-acetaminophen (PERCOCET) 5-325 MG tablet Take 1 tablet by mouth every 4 (four) hours as needed for severe pain. 20 tablet 0   simvastatin (ZOCOR) 40 MG tablet Take 40 mg by mouth daily.     solifenacin (VESICARE) 5 MG tablet TAKE 1 TABLET EVERY DAY 90 tablet 1   TRUE METRIX BLOOD GLUCOSE TEST test strip SMARTSIG:Via Meter     TRUEplus Lancets 33G MISC 1 each by Does not apply route 2 (two) times daily. 200 each 4   No current facility-administered medications for this visit.   Facility-Administered Medications Ordered in Other Visits  Medication Dose Route Frequency  Provider Last Rate Last Admin   sodium chloride flush (NS) 0.9 % injection 10 mL  10 mL Intracatheter PRN Curt Bears, MD   10 mL at 09/04/19 1556    SURGICAL HISTORY:  Past Surgical History:  Procedure Laterality Date   BRONCHIAL BIOPSY  08/20/2019   Procedure: BRONCHIAL BIOPSIES;  Surgeon: Collene Gobble, MD;  Location: [facility] ENDOSCOPY;  Service: Pulmonary;;   BRONCHIAL BRUSHINGS  08/20/2019   Procedure: BRONCHIAL BRUSHINGS;  Surgeon: Collene Gobble, MD;  Location: [facility] ENDOSCOPY;  Service: Pulmonary;;   BRONCHIAL NEEDLE ASPIRATION BIOPSY  08/20/2019   Procedure: BRONCHIAL NEEDLE ASPIRATION BIOPSIES;  Surgeon: Collene Gobble, MD;  Location: [facility];  Service: Pulmonary;;   CARPAL TUNNEL RELEASE Right 01/09/2019   Procedure: CARPAL TUNNEL RELEASE;  Surgeon: Leandrew Koyanagi, MD;  Location: [facility];  Service: Orthopedics;  Laterality: Right;   CARPAL TUNNEL WITH CUBITAL TUNNEL Right 11/18/2020   Procedure: CARPAL TUNNEL WITH CUBITAL TUNNEL;  Surgeon: Charlotte Crumb, MD;  Location: [facility];  Service: Orthopedics;  Laterality: Right;   CATARACT EXTRACTION Right 2018   COLONOSCOPY  2007   Dr. Benson Norway   DIRECT LARYNGOSCOPY N/A 04/10/2020   Procedure: DIRECT LARYNGOSCOPY;  Surgeon: Melida Quitter, MD;  Location: [facility];  Service: ENT;  Laterality: N/A;   I & D EXTREMITY Right 04/02/2016   Procedure: IRRIGATION AND DEBRIDEMENT GREAT TOE;  Surgeon: Leandrew Koyanagi, MD;  Location: [facility];  Service: Orthopedics;  Laterality: Right;   IR IMAGING GUIDED PORT INSERTION  08/30/2019   MULTIPLE TOOTH EXTRACTIONS     RADIOACTIVE SEED IMPLANT  2003   TRACHEOSTOMY TUBE PLACEMENT N/A 04/10/2020   Procedure: TRACHEOSTOMY;  Surgeon: Melida Quitter, MD;  Location: [facility];  Service: ENT;  Laterality: N/A;   ULNAR TUNNEL RELEASE Right 01/09/2019   Procedure: RIGHT CUBITAL TUNNEL RELEASE AND CARPAL TUNNEL RELEASE;  Surgeon: Leandrew Koyanagi, MD;  Location: [facility];  Service: Orthopedics;   Laterality: Right;   UPPER GASTROINTESTINAL ENDOSCOPY     VIDEO BRONCHOSCOPY N/A 12/18/2019   Procedure: VIDEO BRONCHOSCOPY WITHOUT FLUORO;  Surgeon: Collene Gobble, MD;  Location: [facility] ENDOSCOPY;  Service: Cardiopulmonary;  Laterality: N/A;   VIDEO BRONCHOSCOPY WITH ENDOBRONCHIAL ULTRASOUND N/A 08/20/2019   Procedure: VIDEO BRONCHOSCOPY WITH ENDOBRONCHIAL ULTRASOUND;  Surgeon: Collene Gobble, MD;  Location: [facility] ENDOSCOPY;  Service: Pulmonary;  Laterality: N/A;    REVIEW OF SYSTEMS:  A comprehensive review of systems was negative except for: Constitutional: positive for fatigue   PHYSICAL EXAMINATION: General appearance: alert, cooperative, and no distress Head: Normocephalic, without obvious abnormality, atraumatic Neck: no adenopathy, no JVD, supple, symmetrical, trachea midline, and thyroid not enlarged,  symmetric, no tenderness/mass/nodules Lymph nodes: Cervical, supraclavicular, and axillary nodes normal. Resp: clear to auscultation bilaterally Back: symmetric, no curvature. ROM normal. No CVA tenderness. Cardio: regular rate and rhythm, S1, S2 normal, no murmur, click, rub or gallop GI: soft, non-tender; bowel sounds normal; no masses,  no organomegaly Extremities: extremities normal, atraumatic, no cyanosis or edema  ECOG PERFORMANCE STATUS: 1 - Symptomatic but completely ambulatory  Blood pressure (!) 155/84, pulse 74, temperature (!) 97.3 F (36.3 C), temperature source Tympanic, resp. rate 19, height '5\' 7"'  (1.702 m), weight 179 lb 3.2 oz (81.3 kg), SpO2 100 %.  LABORATORY DATA: Lab Results  Component Value Date   WBC 5.3 12/23/2020   HGB 11.0 (L) 12/23/2020   HCT 32.5 (L) 12/23/2020   MCV 92.9 12/23/2020   PLT 249 12/23/2020      Chemistry      Component Value Date/Time   NA 140 12/02/2020 0839   NA 132 (L) 08/14/2019 0918   K 4.1 12/02/2020 0839   CL 107 12/02/2020 0839   CO2 24 12/02/2020 0839   BUN 20 12/02/2020 0839   BUN 16 08/14/2019 0918   CREATININE 1.30  (H) 12/02/2020 0839   CREATININE 1.25 (H) 08/08/2016 0912      Component Value Date/Time   CALCIUM 9.2 12/02/2020 0839   ALKPHOS 61 12/02/2020 0839   AST 14 (L) 12/02/2020 0839   ALT 9 12/02/2020 0839   BILITOT 0.6 12/02/2020 0839       RADIOGRAPHIC STUDIES: No results found.  ASSESSMENT AND PLAN: This is a very pleasant 80 years old [name] with stage IV non-small cell lung cancer, squamous cell carcinoma with negative PD-L1 expression. The patient is currently undergoing systemic chemotherapy with carboplatin and paclitaxel for 2 cycles in addition to immunotherapy with ipilimumab 1 mg/KG every 6 weeks and nivolumab 360 mg IV every 3 weeks status post 11 cycles. The patient has been tolerating this treatment well with no concerning adverse effects. I recommended for him to proceed with day 1 of cycle #12 today as planned. I will see him back for follow-up visit in 3 weeks for evaluation before day 22 of cycle #12. The patient was advised to call immediately if he has any other concerning symptoms in the interval. The patient voices understanding of current disease status and treatment options and is in agreement with the current care plan.  All questions were answered. The patient knows to call the clinic with any problems, questions or concerns. We can certainly see the patient much sooner if necessary.  Disclaimer: This note was dictated with voice recognition software. Similar sounding words can inadvertently be transcribed and may not be corrected upon review.

## 2020-12-23 NOTE — Progress Notes (Signed)

## 2020-12-23 NOTE — Progress Notes (Signed)
.  Shaun Kennedy

## 2020-12-23 NOTE — Patient Instructions (Signed)
[facility]COL[facility]  Discharge Instructions: Thank you for choosing [facility] to provide your oncology and hematology care.   If you have a lab appointment with the [facility], please go directly to the [facility] and check in at the registration area.   Wear comfortable clothing and clothing appropriate for easy access to any Portacath or PICC line.   We strive to give you quality time with your provider. You may need to reschedule your appointment if you arrive late (15 or more minutes).  Arriving late affects you and other patients whose appointments are after yours.  Also, if you miss three or more appointments without notifying the office, you may be dismissed from the clinic at the provider's discretion.      For prescription refill requests, have your pharmacy contact our office and allow 72 hours for refills to be completed.    Today you received the following chemotherapy and/or immunotherapy agents opdivo/yervoy      To help prevent nausea and vomiting after your treatment, we encourage you to take your nausea medication as directed.  BELOW ARE SYMPTOMS THAT SHOULD BE REPORTED IMMEDIATELY: *FEVER GREATER THAN 100.4 F (38 C) OR HIGHER *CHILLS OR SWEATING *NAUSEA AND VOMITING THAT IS NOT CONTROLLED WITH YOUR NAUSEA MEDICATION *UNUSUAL SHORTNESS OF BREATH *UNUSUAL BRUISING OR BLEEDING *URINARY PROBLEMS (pain or burning when urinating, or frequent urination) *BOWEL PROBLEMS (unusual diarrhea, constipation, pain near the anus) TENDERNESS IN MOUTH AND THROAT WITH OR WITHOUT PRESENCE OF ULCERS (sore throat, sores in mouth, or a toothache) UNUSUAL RASH, SWELLING OR PAIN  UNUSUAL VAGINAL DISCHARGE OR ITCHING   Items with * indicate a potential emergency and should be followed up as soon as possible or go to the Emergency Department if any problems should occur.  Please show the CHEMOTHERAPY ALERT CARD or IMMUNOTHERAPY ALERT CARD at check-in  to the Emergency Department and triage nurse.  Should you have questions after your visit or need to cancel or reschedule your appointment, please contact [facility]  Dept: [phone]  and follow the prompts.  Office hours are 8:00 a.m. to 4:30 p.m. Monday - Friday. Please note that voicemails left after 4:00 p.m. may not be returned until the following business day.  We are closed weekends and major holidays. You have access to a nurse at all times for urgent questions. Please call the main number to the clinic Dept: [phone] and follow the prompts.   For any non-urgent questions, you may also contact your provider using MyChart. We now offer e-Visits for anyone 53 and older to request care online for non-urgent symptoms. For details visit [email or web address].   Also download the MyChart app! Go to the app store, search "MyChart", open the app, select [facility], and log in with your MyChart username and password.  Due to Covid, a mask is required upon entering the hospital/clinic. If you do not have a mask, one will be given to you upon arrival. For doctor visits, patients may have 1 support person aged 68 or older with them. For treatment visits, patients cannot have anyone with them due to current Covid guidelines and our immunocompromised population.

## 2021-01-08 ENCOUNTER — Other Ambulatory Visit: Payer: Self-pay

## 2021-01-08 ENCOUNTER — Encounter: Payer: Self-pay | Admitting: Family Medicine

## 2021-01-08 ENCOUNTER — Ambulatory Visit (INDEPENDENT_AMBULATORY_CARE_PROVIDER_SITE_OTHER): Payer: Medicare HMO | Admitting: Family Medicine

## 2021-01-08 VITALS — BP 134/76 | HR 61 | Temp 96.2°F | Wt 182.6 lb

## 2021-01-08 DIAGNOSIS — C3432 Malignant neoplasm of lower lobe, left bronchus or lung: Secondary | ICD-10-CM | POA: Diagnosis not present

## 2021-01-08 DIAGNOSIS — K4090 Unilateral inguinal hernia, without obstruction or gangrene, not specified as recurrent: Secondary | ICD-10-CM

## 2021-01-08 NOTE — Progress Notes (Signed)
   Subjective:    Patient ID: Shaun Kennedy, male    DOB: 1940-07-07, 80 y.o.   MRN: [id number]  HPI He states that he noted swelling in the right inguinal area approximately 1 month ago that has been getting worse.   Review of Systems     Objective:   Physical Exam Alert and in no distress.  Lurline Idol is in place.  Exam of the right lower quadrant does show a 4 to 5 inch area of swelling that does bulge with coughing.       Assessment & Plan:  Right inguinal hernia - Plan: Ambulatory referral to General Surgery  Malignant neoplasm of lower lobe of left lung ([place]) Explained that we will set up an appointment for him to have this evaluated by surgeons.  He will work around his chemotherapy to get this evaluated more thoroughly.

## 2021-01-10 ENCOUNTER — Other Ambulatory Visit: Payer: Self-pay | Admitting: Family Medicine

## 2021-01-10 DIAGNOSIS — R531 Weakness: Secondary | ICD-10-CM

## 2021-01-12 ENCOUNTER — Ambulatory Visit: Payer: Medicare HMO | Admitting: Surgery

## 2021-01-13 ENCOUNTER — Inpatient Hospital Stay: Payer: Medicare HMO | Attending: Internal Medicine

## 2021-01-13 ENCOUNTER — Inpatient Hospital Stay (HOSPITAL_BASED_OUTPATIENT_CLINIC_OR_DEPARTMENT_OTHER): Payer: Medicare HMO | Admitting: Internal Medicine

## 2021-01-13 ENCOUNTER — Other Ambulatory Visit: Payer: Self-pay

## 2021-01-13 ENCOUNTER — Inpatient Hospital Stay: Payer: Medicare HMO

## 2021-01-13 VITALS — BP 142/71 | HR 67 | Temp 98.3°F | Resp 19 | Ht 67.0 in | Wt 187.1 lb

## 2021-01-13 DIAGNOSIS — Z79899 Other long term (current) drug therapy: Secondary | ICD-10-CM | POA: Diagnosis not present

## 2021-01-13 DIAGNOSIS — Z7982 Long term (current) use of aspirin: Secondary | ICD-10-CM | POA: Diagnosis not present

## 2021-01-13 DIAGNOSIS — J45909 Unspecified asthma, uncomplicated: Secondary | ICD-10-CM | POA: Insufficient documentation

## 2021-01-13 DIAGNOSIS — Z5112 Encounter for antineoplastic immunotherapy: Secondary | ICD-10-CM | POA: Diagnosis not present

## 2021-01-13 DIAGNOSIS — C3432 Malignant neoplasm of lower lobe, left bronchus or lung: Secondary | ICD-10-CM | POA: Diagnosis not present

## 2021-01-13 DIAGNOSIS — I10 Essential (primary) hypertension: Secondary | ICD-10-CM | POA: Diagnosis not present

## 2021-01-13 DIAGNOSIS — Z95828 Presence of other vascular implants and grafts: Secondary | ICD-10-CM

## 2021-01-13 DIAGNOSIS — E119 Type 2 diabetes mellitus without complications: Secondary | ICD-10-CM | POA: Diagnosis not present

## 2021-01-13 DIAGNOSIS — Z7984 Long term (current) use of oral hypoglycemic drugs: Secondary | ICD-10-CM | POA: Diagnosis not present

## 2021-01-13 DIAGNOSIS — E785 Hyperlipidemia, unspecified: Secondary | ICD-10-CM | POA: Insufficient documentation

## 2021-01-13 DIAGNOSIS — Z87891 Personal history of nicotine dependence: Secondary | ICD-10-CM | POA: Insufficient documentation

## 2021-01-13 LAB — CBC WITH DIFFERENTIAL (CANCER CENTER ONLY)
Abs Immature Granulocytes: 0.01 10*3/uL (ref 0.00–0.07)
Basophils Absolute: 0 10*3/uL (ref 0.0–0.1)
Basophils Relative: 0 %
Eosinophils Absolute: 0.4 10*3/uL (ref 0.0–0.5)
Eosinophils Relative: 8 %
HCT: 30.6 % — ABNORMAL LOW (ref 39.0–52.0)
Hemoglobin: 10.4 g/dL — ABNORMAL LOW (ref 13.0–17.0)
Immature Granulocytes: 0 %
Lymphocytes Relative: 28 %
Lymphs Abs: 1.4 10*3/uL (ref 0.7–4.0)
MCH: 31.5 pg (ref 26.0–34.0)
MCHC: 34 g/dL (ref 30.0–36.0)
MCV: 92.7 fL (ref 80.0–100.0)
Monocytes Absolute: 0.5 10*3/uL (ref 0.1–1.0)
Monocytes Relative: 11 %
Neutro Abs: 2.6 10*3/uL (ref 1.7–7.7)
Neutrophils Relative %: 53 %
Platelet Count: 268 10*3/uL (ref 150–400)
RBC: 3.3 MIL/uL — ABNORMAL LOW (ref 4.22–5.81)
RDW: 12.2 % (ref 11.5–15.5)
WBC Count: 4.9 10*3/uL (ref 4.0–10.5)
nRBC: 0 % (ref 0.0–0.2)

## 2021-01-13 LAB — CMP (CANCER CENTER ONLY)
ALT: 9 U/L (ref 0–44)
AST: 12 U/L — ABNORMAL LOW (ref 15–41)
Albumin: 3.4 g/dL — ABNORMAL LOW (ref 3.5–5.0)
Alkaline Phosphatase: 62 U/L (ref 38–126)
Anion gap: 9 (ref 5–15)
BUN: 23 mg/dL (ref 8–23)
CO2: 24 mmol/L (ref 22–32)
Calcium: 8.8 mg/dL — ABNORMAL LOW (ref 8.9–10.3)
Chloride: 107 mmol/L (ref 98–111)
Creatinine: 1.3 mg/dL — ABNORMAL HIGH (ref 0.61–1.24)
GFR, Estimated: 55 mL/min — ABNORMAL LOW (ref >60)
Glucose, Bld: 113 mg/dL — ABNORMAL HIGH (ref 70–99)
Potassium: 3.9 mmol/L (ref 3.5–5.1)
Sodium: 140 mmol/L (ref 135–145)
Total Bilirubin: 0.6 mg/dL (ref 0.3–1.2)
Total Protein: 7.6 g/dL (ref 6.5–8.1)

## 2021-01-13 LAB — TSH: TSH: 0.807 u[IU]/mL (ref 0.320–4.118)

## 2021-01-13 MED ORDER — SODIUM CHLORIDE 0.9% FLUSH
10.0000 mL | Freq: Once | INTRAVENOUS | Status: AC
  Administered 2021-01-13: 10 mL

## 2021-01-13 MED ORDER — SODIUM CHLORIDE 0.9 % IV SOLN
360.0000 mg | Freq: Once | INTRAVENOUS | Status: AC
  Administered 2021-01-13: 360 mg via INTRAVENOUS
  Filled 2021-01-13: qty 24

## 2021-01-13 MED ORDER — SODIUM CHLORIDE 0.9% FLUSH
10.0000 mL | INTRAVENOUS | Status: DC | PRN
  Administered 2021-01-13: 10 mL

## 2021-01-13 MED ORDER — HEPARIN SOD (PORK) LOCK FLUSH 100 UNIT/ML IV SOLN
500.0000 [IU] | Freq: Once | INTRAVENOUS | Status: AC | PRN
  Administered 2021-01-13: 500 [IU]

## 2021-01-13 MED ORDER — SODIUM CHLORIDE 0.9 % IV SOLN: Freq: Once | INTRAVENOUS | Status: AC

## 2021-01-13 NOTE — Patient Instructions (Signed)
[facility]  Discharge Instructions: ?Thank you for choosing [facility] to provide your oncology and hematology care.  ? ?If you have a lab appointment with the Cancer Center, please go directly to the Cancer Center and check in at the registration area. ?  ?Wear comfortable clothing and clothing appropriate for easy access to any Portacath or PICC line.  ? ?We strive to give you quality time with your provider. You may need to reschedule your appointment if you arrive late (15 or more minutes).  Arriving late affects you and other patients whose appointments are after yours.  Also, if you miss three or more appointments without notifying the office, you may be dismissed from the clinic at the provider?s discretion.    ?  ?For prescription refill requests, have your pharmacy contact our office and allow 72 hours for refills to be completed.   ? ?Today you received the following chemotherapy and/or immunotherapy agents Opdivo    ?  ?To help prevent nausea and vomiting after your treatment, we encourage you to take your nausea medication as directed. ? ?BELOW ARE SYMPTOMS THAT SHOULD BE REPORTED IMMEDIATELY: ?*FEVER GREATER THAN 100.4 F (38 ?C) OR HIGHER ?*CHILLS OR SWEATING ?*NAUSEA AND VOMITING THAT IS NOT CONTROLLED WITH YOUR NAUSEA MEDICATION ?*UNUSUAL SHORTNESS OF BREATH ?*UNUSUAL BRUISING OR BLEEDING ?*URINARY PROBLEMS (pain or burning when urinating, or frequent urination) ?*BOWEL PROBLEMS (unusual diarrhea, constipation, pain near the anus) ?TENDERNESS IN MOUTH AND THROAT WITH OR WITHOUT PRESENCE OF ULCERS (sore throat, sores in mouth, or a toothache) ?UNUSUAL RASH, SWELLING OR PAIN  ?UNUSUAL VAGINAL DISCHARGE OR ITCHING  ? ?Items with * indicate a potential emergency and should be followed up as soon as possible or go to the Emergency Department if any problems should occur. ? ?Please show the CHEMOTHERAPY ALERT CARD or IMMUNOTHERAPY ALERT CARD at check-in to the  Emergency Department and triage nurse. ? ?Should you have questions after your visit or need to cancel or reschedule your appointment, please contact [facility]  Dept: [phone]  and follow the prompts.  Office hours are 8:00 a.m. to 4:30 p.m. Monday - Friday. Please note that voicemails left after 4:00 p.m. may not be returned until the following business day.  We are closed weekends and major holidays. You have access to a nurse at all times for urgent questions. Please call the main number to the clinic Dept: [phone] and follow the prompts. ? ? ?For any non-urgent questions, you may also contact your provider using MyChart. We now offer e-Visits for anyone 18 and older to request care online for non-urgent symptoms. For details visit [email or web address]. ?  ?Also download the MyChart app! Go to the app store, search "MyChart", open the app, select , and log in with your MyChart username and password. ? ?Due to Covid, a mask is required upon entering the hospital/clinic. If you do not have a mask, one will be given to you upon arrival. For doctor visits, patients may have 1 support person aged 18 or older with them. For treatment visits, patients cannot have anyone with them due to current Covid guidelines and our immunocompromised population.  ? ?

## 2021-01-13 NOTE — Progress Notes (Signed)
[place] [phone]   [phone]  OFFICE PROGRESS NOTE  Shaun Kennedy, [name] [address]  DIAGNOSIS: stage IV (T3, N2, M1 a) non-small cell Kennedy cancer, squamous cell carcinoma presented with obstructive left lower lobe Kennedy mass in addition to mediastinal lymphadenopathy as well as bilateral pulmonary nodules diagnosed in July 2021.   PDL1: 0%   PRIOR THERAPY: None   CURRENT THERAPY: 1) Palliative radiotherapy to the obstructive left lower lobe Kennedy mass under the care of Dr. Lisbeth Renshaw.  2)  systemic chemotherapy 2 cycles of chemotherapy with carboplatin for an AUC of 5 and paclitaxel 175 mg/m2 in addition to immunotherapy with nivolumab 360 mg every 3 weeks and ipilimumab 1 mg/kg IV every 6 weeks followed by maintenance nivolumab and ipilimumab.  He started the first treatment on 09/04/2019.  He is status post 11 cycles of the treatment.  INTERVAL HISTORY: Shaun Kennedy 80 y.o. male returns to the clinic today for follow-up visit.  The patient is feeling fine today with no concerning complaints.  He denied having any chest pain, shortness of breath except with exertion with no cough or hemoptysis.  He denied having any fever or chills.  He has no nausea, vomiting, diarrhea or constipation.  He has no headache or visual changes.  He continues to tolerate his treatment with ipilimumab and nivolumab fairly well.  The patient is here today for evaluation before starting day 22 of cycle #12.   MEDICAL HISTORY: Past Medical History:  Diagnosis Date   Allergic rhinitis    Asthma    Carotid stenosis    Colon cancer ([place]) 2003   Diabetes mellitus ([place])    Diverticulosis    Dyslipidemia    ED (erectile dysfunction)    GERD (gastroesophageal reflux disease)    H/O degenerative disc disease    Hemorrhoids    HTN (hypertension)    as a child   Hyperlipidemia    Kennedy cancer ([place])    LVH (left ventricular hypertrophy)     on EKG   Mass of lower lobe of left Kennedy    Mediastinal adenopathy    Smoker    former   Wears dentures    Wears dentures    Wears glasses    Wears glasses     ALLERGIES:  has No Known Allergies.  MEDICATIONS:  Current Outpatient Medications  Medication Sig Dispense Refill   Alcohol Swabs (DROPSAFE ALCOHOL PREP) 70 % PADS Apply topically.     amLODipine (NORVASC) 5 MG tablet Take 1 tablet (5 mg total) by mouth daily. 90 tablet 3   aspirin EC 81 MG tablet Take 81 mg by mouth daily after breakfast.      bisacodyl (DULCOLAX) 5 MG EC tablet Take 10 mg by mouth daily as needed for moderate constipation.     Blood Glucose Calibration (TRUE METRIX LEVEL 1) Low SOLN      dronabinol (MARINOL) 2.5 MG capsule Take 1 capsule (2.5 mg total) by mouth 2 (two) times daily before a meal. 60 capsule 1   ferrous sulfate 325 (65 FE) MG EC tablet TAKE 1 TABLET (325 MG TOTAL) DAILY WITH BREAKFAST. 90 tablet 1   Lancet Devices (TRUEDRAW LANCING DEVICE) MISC Check sugars daily 90 each 0   lisinopril-hydrochlorothiazide (ZESTORETIC) 20-12.5 MG tablet Take 1 tablet by mouth daily.     metFORMIN (GLUCOPHAGE-XR) 500 MG 24 hr tablet TAKE 1 TABLET EVERY DAY 90 tablet 1  Multiple Vitamin (MULTIVITAMIN WITH MINERALS) TABS tablet Take 1 tablet by mouth daily after breakfast.      oxyCODONE-acetaminophen (PERCOCET) 5-325 MG tablet Take 1 tablet by mouth every 4 (four) hours as needed for severe pain. (Patient not taking: Reported on 01/08/2021) 20 tablet 0   simvastatin (ZOCOR) 40 MG tablet Take 40 mg by mouth daily.     solifenacin (VESICARE) 5 MG tablet TAKE 1 TABLET EVERY DAY 90 tablet 1   TRUE METRIX BLOOD GLUCOSE TEST test strip SMARTSIG:Via Meter     TRUEplus Lancets 33G MISC 1 each by Does not apply route 2 (two) times daily. 200 each 4   No current facility-administered medications for this visit.   Facility-Administered Medications Ordered in Other Visits  Medication Dose Route Frequency Provider Last  Rate Last Admin   sodium chloride flush (NS) 0.9 % injection 10 mL  10 mL Intracatheter PRN Curt Bears, MD   10 mL at 09/04/19 1556    SURGICAL HISTORY:  Past Surgical History:  Procedure Laterality Date   BRONCHIAL BIOPSY  08/20/2019   Procedure: BRONCHIAL BIOPSIES;  Surgeon: Collene Gobble, MD;  Location: [facility] ENDOSCOPY;  Service: Pulmonary;;   BRONCHIAL BRUSHINGS  08/20/2019   Procedure: BRONCHIAL BRUSHINGS;  Surgeon: Collene Gobble, MD;  Location: [facility] ENDOSCOPY;  Service: Pulmonary;;   BRONCHIAL NEEDLE ASPIRATION BIOPSY  08/20/2019   Procedure: BRONCHIAL NEEDLE ASPIRATION BIOPSIES;  Surgeon: Collene Gobble, MD;  Location: [place];  Service: Pulmonary;;   CARPAL TUNNEL RELEASE Right 01/09/2019   Procedure: CARPAL TUNNEL RELEASE;  Surgeon: Leandrew Koyanagi, MD;  Location: [place];  Service: Orthopedics;  Laterality: Right;   CARPAL TUNNEL WITH CUBITAL TUNNEL Right 11/18/2020   Procedure: CARPAL TUNNEL WITH CUBITAL TUNNEL;  Surgeon: Charlotte Crumb, MD;  Location: [place];  Service: Orthopedics;  Laterality: Right;   CATARACT EXTRACTION Right 2018   COLONOSCOPY  2007   Dr. Benson Norway   DIRECT LARYNGOSCOPY N/A 04/10/2020   Procedure: DIRECT LARYNGOSCOPY;  Surgeon: Melida Quitter, MD;  Location: [place];  Service: ENT;  Laterality: N/A;   I & D EXTREMITY Right 04/02/2016   Procedure: IRRIGATION AND DEBRIDEMENT GREAT TOE;  Surgeon: Leandrew Koyanagi, MD;  Location: [place];  Service: Orthopedics;  Laterality: Right;   IR IMAGING GUIDED PORT INSERTION  08/30/2019   MULTIPLE TOOTH EXTRACTIONS     RADIOACTIVE SEED IMPLANT  2003   TRACHEOSTOMY TUBE PLACEMENT N/A 04/10/2020   Procedure: TRACHEOSTOMY;  Surgeon: Melida Quitter, MD;  Location: [place];  Service: ENT;  Laterality: N/A;   ULNAR TUNNEL RELEASE Right 01/09/2019   Procedure: RIGHT CUBITAL TUNNEL RELEASE AND CARPAL TUNNEL RELEASE;  Surgeon: Leandrew Koyanagi, MD;  Location: [place];  Service: Orthopedics;  Laterality: Right;    UPPER GASTROINTESTINAL ENDOSCOPY     VIDEO BRONCHOSCOPY N/A 12/18/2019   Procedure: VIDEO BRONCHOSCOPY WITHOUT FLUORO;  Surgeon: Collene Gobble, MD;  Location: [facility] ENDOSCOPY;  Service: Cardiopulmonary;  Laterality: N/A;   VIDEO BRONCHOSCOPY WITH ENDOBRONCHIAL ULTRASOUND N/A 08/20/2019   Procedure: VIDEO BRONCHOSCOPY WITH ENDOBRONCHIAL ULTRASOUND;  Surgeon: Collene Gobble, MD;  Location: [facility] ENDOSCOPY;  Service: Pulmonary;  Laterality: N/A;    REVIEW OF SYSTEMS:  A comprehensive review of systems was negative except for: Constitutional: positive for fatigue Respiratory: positive for dyspnea on exertion   PHYSICAL EXAMINATION: General appearance: alert, cooperative, and no distress Head: Normocephalic, without obvious abnormality, atraumatic Neck: no adenopathy, no JVD, supple, symmetrical, trachea midline, and thyroid not  enlarged, symmetric, no tenderness/mass/nodules Lymph nodes: Cervical, supraclavicular, and axillary nodes normal. Resp: clear to auscultation bilaterally Back: symmetric, no curvature. ROM normal. No CVA tenderness. Cardio: regular rate and rhythm, S1, S2 normal, no murmur, click, rub or gallop GI: soft, non-tender; bowel sounds normal; no masses,  no organomegaly Extremities: extremities normal, atraumatic, no cyanosis or edema  ECOG PERFORMANCE STATUS: 1 - Symptomatic but completely ambulatory  Blood pressure (!) 142/71, pulse 67, temperature 98.3 F (36.8 C), temperature source Tympanic, resp. rate 19, height '5\' 7"'  (1.702 m), weight 187 lb 1.6 oz (84.9 kg), SpO2 100 %.  LABORATORY DATA: Lab Results  Component Value Date   WBC 4.9 01/13/2021   HGB 10.4 (L) 01/13/2021   HCT 30.6 (L) 01/13/2021   MCV 92.7 01/13/2021   PLT 268 01/13/2021      Chemistry      Component Value Date/Time   NA 139 12/23/2020 0801   NA 132 (L) 08/14/2019 0918   K 4.0 12/23/2020 0801   CL 106 12/23/2020 0801   CO2 24 12/23/2020 0801   BUN 17 12/23/2020 0801   BUN 16 08/14/2019  0918   CREATININE 1.29 (H) 12/23/2020 0801   CREATININE 1.25 (H) 08/08/2016 0912      Component Value Date/Time   CALCIUM 8.9 12/23/2020 0801   ALKPHOS 61 12/23/2020 0801   AST 13 (L) 12/23/2020 0801   ALT 10 12/23/2020 0801   BILITOT 0.7 12/23/2020 0801       RADIOGRAPHIC STUDIES: No results found.  ASSESSMENT AND PLAN: This is a very pleasant 80 years old [name] with stage IV non-small cell Kennedy cancer, squamous cell carcinoma with negative PD-L1 expression. The patient is currently undergoing systemic chemotherapy with carboplatin and paclitaxel for 2 cycles in addition to immunotherapy with ipilimumab 1 mg/KG every 6 weeks and nivolumab 360 mg IV every 3 weeks status post 11 cycles. The patient continues to tolerate this treatment fairly well with no concerning adverse effects. I recommended for him to proceed with day 22 of cycle #12 today as planned. I will see him back for follow-up visit in 3 weeks for evaluation before the next cycle of his treatment. He was advised to call immediately if he has any other concerning issues in the interval. The patient voices understanding of current disease status and treatment options and is in agreement with the current care plan.  All questions were answered. The patient knows to call the clinic with any problems, questions or concerns. We can certainly see the patient much sooner if necessary.  Disclaimer: This note was dictated with voice recognition software. Similar sounding words can inadvertently be transcribed and may not be corrected upon review.

## 2021-01-14 ENCOUNTER — Encounter: Payer: Self-pay | Admitting: Surgery

## 2021-01-14 ENCOUNTER — Telehealth: Payer: Self-pay

## 2021-01-14 ENCOUNTER — Ambulatory Visit: Payer: Self-pay | Admitting: Surgery

## 2021-01-14 ENCOUNTER — Ambulatory Visit (INDEPENDENT_AMBULATORY_CARE_PROVIDER_SITE_OTHER): Payer: Medicare HMO | Admitting: Surgery

## 2021-01-14 VITALS — BP 159/81 | HR 79 | Temp 98.4°F | Ht 69.0 in | Wt 184.4 lb

## 2021-01-14 DIAGNOSIS — K4090 Unilateral inguinal hernia, without obstruction or gangrene, not specified as recurrent: Secondary | ICD-10-CM | POA: Diagnosis not present

## 2021-01-14 NOTE — Telephone Encounter (Signed)
Medical Clearance faxed to Dr.John [facility]mond [facility].   Oncology Clearance faxed to [facility].

## 2021-01-14 NOTE — Progress Notes (Signed)
Patient ID: Shaun Kennedy, male   DOB: Apr 30, 1940, 80 y.o.   MRN: [id number]  Chief Complaint: Right inguinal hernia  History of Present Illness Shaun Kennedy is a 80 y.o. male with a progressively enlarging right groin mass, minimally discomforting.  He denies any bowel habit changes, reports urinary incontinence, with urinary urgency.  No prior abdominal surgery, nor prior hernia surgery.  Has a tracheostomy for non-small cell lung cancer, currently on treatment for the same.  Reports that he remains quite active at home, the hernia appears to be more pronounced while he is upright and walking.  He reports he continues to walk significantly on a regular basis.  Had to give up golf due to his grip, but recently had carpal tunnel and ulnar tunnel releases.  He presents with his wife today who is quite attentive and involved with decision-making process.  Past Medical History Past Medical History:  Diagnosis Date   Allergic rhinitis    Asthma    Carotid stenosis    Colon cancer ([place]) 2003   Diabetes mellitus ([place])    Diverticulosis    Dyslipidemia    ED (erectile dysfunction)    GERD (gastroesophageal reflux disease)    H/O degenerative disc disease    Hemorrhoids    HTN (hypertension)    as a child   Hyperlipidemia    Lung cancer ([place])    LVH (left ventricular hypertrophy)    on EKG   Mass of lower lobe of left lung    Mediastinal adenopathy    Smoker    former   Wears dentures    Wears dentures    Wears glasses    Wears glasses       Past Surgical History:  Procedure Laterality Date   BRONCHIAL BIOPSY  08/20/2019   Procedure: BRONCHIAL BIOPSIES;  Surgeon: Collene Gobble, MD;  Location: [facility] ENDOSCOPY;  Service: Pulmonary;;   BRONCHIAL BRUSHINGS  08/20/2019   Procedure: BRONCHIAL BRUSHINGS;  Surgeon: Collene Gobble, MD;  Location: [facility] ENDOSCOPY;  Service: Pulmonary;;   BRONCHIAL NEEDLE ASPIRATION BIOPSY  08/20/2019   Procedure: BRONCHIAL NEEDLE ASPIRATION BIOPSIES;  Surgeon:  Collene Gobble, MD;  Location: [facility] ENDOSCOPY;  Service: Pulmonary;;   CARPAL TUNNEL RELEASE Right 01/09/2019   Procedure: CARPAL TUNNEL RELEASE;  Surgeon: Leandrew Koyanagi, MD;  Location: [place];  Service: Orthopedics;  Laterality: Right;   CARPAL TUNNEL WITH CUBITAL TUNNEL Right 11/18/2020   Procedure: CARPAL TUNNEL WITH CUBITAL TUNNEL;  Surgeon: Charlotte Crumb, MD;  Location: [place];  Service: Orthopedics;  Laterality: Right;   CATARACT EXTRACTION Right 2018   COLONOSCOPY  2007   Dr. Benson Norway   DIRECT LARYNGOSCOPY N/A 04/10/2020   Procedure: DIRECT LARYNGOSCOPY;  Surgeon: Melida Quitter, MD;  Location: [place];  Service: ENT;  Laterality: N/A;   I & D EXTREMITY Right 04/02/2016   Procedure: IRRIGATION AND DEBRIDEMENT GREAT TOE;  Surgeon: Leandrew Koyanagi, MD;  Location: [place];  Service: Orthopedics;  Laterality: Right;   IR IMAGING GUIDED PORT INSERTION  08/30/2019   MULTIPLE TOOTH EXTRACTIONS     RADIOACTIVE SEED IMPLANT  2003   TRACHEOSTOMY TUBE PLACEMENT N/A 04/10/2020   Procedure: TRACHEOSTOMY;  Surgeon: Melida Quitter, MD;  Location: [place];  Service: ENT;  Laterality: N/A;   ULNAR TUNNEL RELEASE Right 01/09/2019   Procedure: RIGHT CUBITAL TUNNEL RELEASE AND CARPAL TUNNEL RELEASE;  Surgeon: Leandrew Koyanagi, MD;  Location: [place];  Service: Orthopedics;  Laterality:  Right;   UPPER GASTROINTESTINAL ENDOSCOPY     VIDEO BRONCHOSCOPY N/A 12/18/2019   Procedure: VIDEO BRONCHOSCOPY WITHOUT FLUORO;  Surgeon: Collene Gobble, MD;  Location: [facility] ENDOSCOPY;  Service: Cardiopulmonary;  Laterality: N/A;   VIDEO BRONCHOSCOPY WITH ENDOBRONCHIAL ULTRASOUND N/A 08/20/2019   Procedure: VIDEO BRONCHOSCOPY WITH ENDOBRONCHIAL ULTRASOUND;  Surgeon: Collene Gobble, MD;  Location[facility] ENDOSCOPY;  Service: Pulmonary;  Laterality: N/A;    No Known Allergies  Current Outpatient Medications  Medication Sig Dispense Refill   Alcohol Swabs (DROPSAFE ALCOHOL PREP) 70 % PADS Apply topically.      amLODipine (NORVASC) 5 MG tablet Take 1 tablet (5 mg total) by mouth daily. 90 tablet 3   aspirin EC 81 MG tablet Take 81 mg by mouth daily after breakfast.      bisacodyl (DULCOLAX) 5 MG EC tablet Take 10 mg by mouth daily as needed for moderate constipation.     Blood Glucose Calibration (TRUE METRIX LEVEL 1) Low SOLN      dronabinol (MARINOL) 2.5 MG capsule Take 1 capsule (2.5 mg total) by mouth 2 (two) times daily before a meal. 60 capsule 1   ferrous sulfate 325 (65 FE) MG EC tablet TAKE 1 TABLET (325 MG TOTAL) DAILY WITH BREAKFAST. 90 tablet 1   Lancet Devices (TRUEDRAW LANCING DEVICE) MISC Check sugars daily 90 each 0   lisinopril-hydrochlorothiazide (ZESTORETIC) 20-12.5 MG tablet Take 1 tablet by mouth daily.     metFORMIN (GLUCOPHAGE-XR) 500 MG 24 hr tablet TAKE 1 TABLET EVERY DAY 90 tablet 1   Multiple Vitamin (MULTIVITAMIN WITH MINERALS) TABS tablet Take 1 tablet by mouth daily after breakfast.      oxyCODONE-acetaminophen (PERCOCET) 5-325 MG tablet Take 1 tablet by mouth every 4 (four) hours as needed for severe pain. 20 tablet 0   simvastatin (ZOCOR) 40 MG tablet Take 40 mg by mouth daily.     solifenacin (VESICARE) 5 MG tablet TAKE 1 TABLET EVERY DAY 90 tablet 1   TRUE METRIX BLOOD GLUCOSE TEST test strip SMARTSIG:Via Meter     TRUEplus Lancets 33G MISC 1 each by Does not apply route 2 (two) times daily. 200 each 4   No current facility-administered medications for this visit.   Facility-Administered Medications Ordered in Other Visits  Medication Dose Route Frequency Provider Last Rate Last Admin   sodium chloride flush (NS) 0.9 % injection 10 mL  10 mL Intracatheter PRN Curt Bears, MD   10 mL at 09/04/19 1556    Family History Family History  Problem Relation Age of Onset   Heart failure Mother    Diabetes Mother    Stroke Father    Diabetes Father    Diabetes Sister    Diabetes Sister    Lung cancer Sister 10   Colon cancer Neg Hx    Esophageal cancer Neg Hx     Rectal cancer Neg Hx    Colon polyps Neg Hx    Stomach cancer Neg Hx       Social History Social History   Tobacco Use   Smoking status: Former    Packs/day: 1.00    Years: 32.00    Pack years: 32.00    Types: Cigarettes    Start date: 02/08/1955    Quit date: 10/08/1985    Years since quitting: 35.2   Smokeless tobacco: Never  Vaping Use   Vaping Use: Never used  Substance Use Topics   Alcohol use: No   Drug use: No  Review of Systems  Constitutional: Negative.   HENT:  Positive for hearing loss.   Eyes: Negative.   Respiratory: Negative.    Cardiovascular: Negative.   Gastrointestinal:  Positive for constipation.  Genitourinary:  Positive for frequency and urgency.  Skin: Negative.   Neurological: Negative.   Psychiatric/Behavioral: Negative.       Physical Exam Blood pressure (!) 159/81, pulse 79, temperature 98.4 F (36.9 C), temperature source Oral, height 5\' 9"  (1.753 m), weight 184 lb 6.4 oz (83.6 kg), SpO2 97 %. Last Weight  Most recent update: 01/14/2021  2:05 PM    Weight  83.6 kg (184 lb 6.4 oz)             CONSTITUTIONAL: Well developed, and nourished, appropriately responsive and aware without distress.   EYES: Sclera non-icteric.   EARS, NOSE, MOUTH AND THROAT: Mask worn.  NECK: Trachea is midline with tracheostomy in place, and there is no jugular venous distension.  LYMPH NODES:  Lymph nodes in the neck are not appreciated. RESPIRATORY:  Breath sounds are equal bilaterally. Normal respiratory effort without pathologic use of accessory muscles. CARDIOVASCULAR: Heart is regular in rate and rhythm. GI: The abdomen is soft, nontender, and nondistended. There were no palpable masses. I did not appreciate hepatosplenomegaly. There were normal bowel sounds. GU: Readily prominent large right inguinal hernia, readily reducible in supine position.  Radiographic no left inguinal hernia not evaluated, currently not symptomatic. MUSCULOSKELETAL:   Symmetrical muscle tone appreciated in all four extremities.    SKIN: Skin turgor is normal. No pathologic skin lesions appreciated.  NEUROLOGIC:  Motor and sensation appear grossly normal.  Cranial nerves are grossly without defect. PSYCH:  Alert and oriented to person, place and time. Affect is appropriate for situation.  Data Reviewed I have personally reviewed what is currently available of the patient's imaging, recent labs and medical records.   Labs:  CBC Latest Ref Rng & Units 01/13/2021 12/23/2020 12/02/2020  WBC 4.0 - 10.5 K/uL 4.9 5.3 4.7  Hemoglobin 13.0 - 17.0 g/dL 10.4(L) 11.0(L) 10.1(L)  Hematocrit 39.0 - 52.0 % 30.6(L) 32.5(L) 30.0(L)  Platelets 150 - 400 K/uL 268 249 265   CMP Latest Ref Rng & Units 01/13/2021 12/23/2020 12/02/2020  Glucose 70 - 99 mg/dL 113(H) 108(H) 93  BUN 8 - 23 mg/dL 23 17 20   Creatinine 0.61 - 1.24 mg/dL 1.30(H) 1.29(H) 1.30(H)  Sodium 135 - 145 mmol/L 140 139 140  Potassium 3.5 - 5.1 mmol/L 3.9 4.0 4.1  Chloride 98 - 111 mmol/L 107 106 107  CO2 22 - 32 mmol/L 24 24 24   Calcium 8.9 - 10.3 mg/dL 8.8(L) 8.9 9.2  Total Protein 6.5 - 8.1 g/dL 7.6 7.8 7.9  Total Bilirubin 0.3 - 1.2 mg/dL 0.6 0.7 0.6  Alkaline Phos 38 - 126 U/L 62 61 61  AST 15 - 41 U/L 12(L) 13(L) 14(L)  ALT 0 - 44 U/L 9 10 9    For the left which is followed for 13-month   Imaging: Radiology review:   CT imaging from October reviewed. Within last 24 hrs: No results found.  Assessment    Large symptomatic right inguinal hernia. Patient Active Problem List   Diagnosis Date Noted   Trachea, stenosis    Tracheostomy status ([place])    Left lower lobe pneumonia 04/20/2020   Pressure injury of skin 04/20/2020   Pleuritic chest pain 03/17/2020   Chemotherapy-induced neuropathy ([place]) 02/12/2020   Vocal cord dysfunction 02/12/2020   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   Stridor  12/18/2019   Abnormal findings on diagnostic imaging of lung 11/06/2019   Antineoplastic chemotherapy induced  anemia 10/16/2019   Shortness of breath 10/15/2019   Odynophagia 09/25/2019   Port-A-Cath in place 09/17/2019   Decreased appetite 09/03/2019   Malignant neoplasm of lower lobe of left lung ([place]) 08/28/2019   Encounter for antineoplastic chemotherapy 08/24/2019   Encounter for antineoplastic immunotherapy 08/24/2019   Goals of care, counseling/discussion 08/24/2019   Atherosclerosis of aorta ([place]) 04/09/2019   Carpal tunnel syndrome, left upper limb 02/19/2019   History of carpal tunnel surgery of right wrist 12/21/2018   OAB (overactive bladder) 10/11/2017   Diabetic nephropathy associated with type 2 diabetes mellitus ([place]) 10/11/2017   Onychomycosis of multiple toenails with type 2 diabetes mellitus ([place]) 07/22/2015   Former smoker, stopped smoking in distant past 07/22/2015   Diabetes mellitus type II, non insulin dependent ([place]) 06/13/2011   ED (erectile dysfunction) 06/13/2011   History of prostate cancer 06/13/2011   Hypertension associated with diabetes ([place]) 06/13/2011   Hyperlipidemia associated with type 2 diabetes mellitus ([place]) 06/13/2011   Allergic rhinitis, mild 06/13/2011   History of asthma 06/13/2011   Carotid stenosis 06/13/2011    Plan    We will obtain medical and oncological clearance to proceed with general anesthesia.  We discussed that the tracheostomy is actually a source of convenience for general anesthesia considering oral intubation is not necessary. We discussed alternatives to robotic inguinal hernia repair, i.e. open repair under local anesthesia.  We feel the additional stressors of a procedure like this under local anesthesia, may exacerbate risk rather than defer at. Robotic right inguinal hernia repair.   Although a fat filled left inguinal hernia was noted on CT scan, he is not asymptomatic on that side.  And considering his age I do not feel it is worth repairing unless there is an obvious hernia sac present. I discussed possibility of  incarceration, strangulation, enlargement in size over time, and the need for emergency surgery in the face of these.  Also reviewed the techniques of reduction should incarceration occur, and when unsuccessful to present to the ED.  Also discussed that surgery risks include recurrence which can be up to 30% in the case of complex hernias, use of prosthetic materials (mesh) and the increased risk of infection and the possible need for re-operation and removal of mesh, possibility of post-op SBO or ileus, and the risks of general anesthetic including heart attack, stroke, sudden death or some reaction to anesthetic medications. The patient, and those present, appear to understand the risks, any and all questions were answered to the patient's satisfaction.  No guarantees were ever expressed or implied.   Face-to-face time spent with the patient and accompanying care providers(if present) was 45 minutes, with more than 50% of the time spent counseling, educating, and coordinating care of the patient.    These notes generated with voice recognition software. I apologize for typographical errors.  Ronny Bacon M.D., FACS 01/14/2021, 3:10 PM

## 2021-01-14 NOTE — Patient Instructions (Addendum)
Our surgery scheduler will call you within 24-48 hours to schedule your surgery. Please have the [facility] surgery sheet available when speaking with her.   Medical Clearance and Oncology clearances faxed today. Patient to call providers to see if he needs office visit.  Please see your follow up appointment listed below.     Inguinal Hernia, Adult An inguinal hernia develops when fat or the intestines push through a weak spot in a muscle where the leg meets the lower abdomen (groin). This creates a bulge. This kind of hernia could also be: In the scrotum, if you are male. In folds of skin around the vagina, if you are male. There are three types of inguinal hernias: Hernias that can be pushed back into the abdomen (are reducible). This type rarely causes pain. Hernias that are not reducible (are incarcerated). Hernias that are not reducible and lose their blood supply (are strangulated). This type of hernia requires emergency surgery. What are the causes? This condition is caused by having a weak spot in the muscles or tissues in your groin. This develops over time. The hernia may poke through the weak spot when you suddenly strain your lower abdominal muscles, such as when you: Lift a heavy object. Strain to have a bowel movement. Constipation can lead to straining. Cough. What increases the risk? This condition is more likely to develop in: Males. Pregnant females. People who: Are overweight. Work in jobs that require long periods of standing or heavy lifting. Have had an inguinal hernia before. Smoke or have lung disease. These factors can lead to long-term (chronic) coughing. What are the signs or symptoms? Symptoms may depend on the size of the hernia. Often, a small inguinal hernia has no symptoms. Symptoms of a larger hernia may include: A bulge in the groin area. This is easier to see when standing. It might not be visible when lying down. Pain or burning in the groin. This may  get worse when lifting, straining, or coughing. A dull ache or a feeling of pressure in the groin. An unusual bulge in the scrotum, in males. Symptoms of a strangulated inguinal hernia may include: A bulge in your groin that is very painful and tender to the touch. A bulge that turns red or purple. Fever, nausea, and vomiting. Inability to have a bowel movement or to pass gas. How is this diagnosed? This condition is diagnosed based on your symptoms, your medical history, and a physical exam. Your health care provider may feel your groin area and ask you to cough. How is this treated? Treatment depends on the size of your hernia and whether you have symptoms. If you do not have symptoms, your health care provider may have you watch your hernia carefully and have you come in for follow-up visits. If your hernia is large or if you have symptoms, you may need surgery to repair the hernia. Follow these instructions at home: Lifestyle Avoid lifting heavy objects. Avoid standing for long periods of time. Do not use any products that contain nicotine or tobacco. These products include cigarettes, chewing tobacco, and vaping devices, such as e-cigarettes. If you need help quitting, ask your health care provider. Maintain a healthy weight. Preventing constipation You may need to take these actions to prevent or treat constipation: Drink enough fluid to keep your urine pale yellow. Take over-the-counter or prescription medicines. Eat foods that are high in fiber, such as beans, whole grains, and fresh fruits and vegetables. Limit foods that are high in  fat and processed sugars, such as fried or sweet foods. General instructions You may try to push the hernia back in place by very gently pressing on it while lying down. Do not try to force the bulge back in if it will not push in easily. Watch your hernia for any changes in shape, size, or color. Get help right away if you notice any changes. Take  over-the-counter and prescription medicines only as told by your health care provider. Keep all follow-up visits. This is important. Contact a health care provider if: You have a fever or chills. You develop new symptoms. Your symptoms get worse. Get help right away if: You have pain in your groin that suddenly gets worse. You have a bulge in your groin that: Suddenly gets bigger and does not get smaller. Becomes red or purple or painful to the touch. You are a man and you have a sudden pain in your scrotum, or the size of your scrotum suddenly changes. You cannot push the hernia back in place by very gently pressing on it when you are lying down. You have nausea or vomiting that does not go away. You have a fast heartbeat. You cannot have a bowel movement or pass gas. These symptoms may represent a serious problem that is an emergency. Do not wait to see if the symptoms will go away. Get medical help right away. Call your local emergency services (911 in the [place] Summary An inguinal hernia develops when fat or the intestines push through a weak spot in a muscle where your leg meets your lower abdomen (groin). This condition is caused by having a weak spot in muscles or tissues in your groin. Symptoms may depend on the size of the hernia, and they may include pain or swelling in your groin. A small inguinal hernia often has no symptoms. Treatment may not be needed if you do not have symptoms. If you have symptoms or a large hernia, you may need surgery to repair the hernia. Avoid lifting heavy objects. Also, avoid standing for long periods of time. This information is not intended to replace advice given to you by your health care provider. Make sure you discuss any questions you have with your health care provider. Document Revised: 09/24/2019 Document Reviewed: 09/24/2019 Elsevier Patient Education  2022 [employer].

## 2021-01-18 ENCOUNTER — Telehealth: Payer: Self-pay

## 2021-01-18 ENCOUNTER — Telehealth: Payer: Self-pay | Admitting: Surgery

## 2021-01-18 DIAGNOSIS — Z930 Tracheostomy status: Secondary | ICD-10-CM | POA: Diagnosis not present

## 2021-01-18 DIAGNOSIS — J386 Stenosis of larynx: Secondary | ICD-10-CM | POA: Diagnosis not present

## 2021-01-18 DIAGNOSIS — C3432 Malignant neoplasm of lower lobe, left bronchus or lung: Secondary | ICD-10-CM | POA: Diagnosis not present

## 2021-01-18 NOTE — Telephone Encounter (Signed)
Received medical clearance from Dr. Jill Alexanders. He recommends further testing and a clearance from Oncology.

## 2021-01-18 NOTE — Telephone Encounter (Signed)
Outgoing call is made, left message for patient to call.  Please inform patient of the following:   Pre-Admission date/time, COVID Testing date and Surgery date.  Surgery Date: 02/12/21 Preadmission Testing Date: 02/03/21 (phone 8a-1p) Covid Testing Date: Not needed.     Also patient will need to call at [phone], between 1-3:00pm the day before surgery, to find out what time to arrive for surgery.

## 2021-01-18 NOTE — Telephone Encounter (Signed)
Patient's wife, Shaun Kennedy calls back.  They both are now aware of all dates regarding surgery and verbalized understanding.

## 2021-01-18 NOTE — Telephone Encounter (Signed)
Faxed Oncology Clearance to Dr. Julien Nordmann at [phone].

## 2021-01-19 ENCOUNTER — Telehealth: Payer: Self-pay

## 2021-01-19 NOTE — Telephone Encounter (Signed)
Oncology Clearance received from DR.Mohamed-medium risk -recommends further work up prior to surgery by PCP.

## 2021-01-27 ENCOUNTER — Telehealth: Payer: Self-pay | Admitting: Family Medicine

## 2021-01-27 NOTE — Telephone Encounter (Signed)
Pt's wife called and states she would like a referral for [facility] with a neuro doctor. She states that pt is having memory issues. [name] can be reached at [phone].

## 2021-01-28 NOTE — Telephone Encounter (Signed)
Lvm for pt wife to call back . Per Dr. Redmond School he needs an appointment before referral. Shaun Kennedy

## 2021-01-28 NOTE — Telephone Encounter (Signed)
Done kh

## 2021-01-28 NOTE — Progress Notes (Signed)
Shaun Kennedy OFFICE PROGRESS NOTE  Shaun Kennedy, [place] [address]  DIAGNOSIS: Stage IV (T3, N2, M1 a) non-small cell Kennedy cancer, squamous cell carcinoma presented with obstructive left lower lobe Kennedy mass in addition to mediastinal lymphadenopathy as well as bilateral pulmonary nodules diagnosed in July 2021.   PDL1: 0%  PRIOR THERAPY: Palliative radiotherapy to the obstructive left lower lobe Kennedy mass under the care of Dr. Lisbeth Renshaw. Completed on 09/20/19  CURRENT THERAPY: Systemic chemotherapy 2 cycles of chemotherapy with carboplatin for an AUC of 5 and paclitaxel 175 mg/m2 in addition to immunotherapy with nivolumab 360 mg every 3 weeks and ipilimumab 1 mg/kg IV every 6 weeks followed by maintenance nivolumab and ipilimumab.  He started the first treatment on 09/04/2019.  He is status post 12 cycles of treatment.   INTERVAL HISTORY: Shaun Kennedy 80 y.o. male returns to the clinic today for a follow up visit. The patient is feeling well today without any concerning complaints. He follows closely with ENT for his tracheostomy. He also is having an elective hernia repair for an inguinal hernia performed on 02/12/21. The patient continues to tolerate treatment with immunotherapy well without any adverse side effects. Denies any fever, chills, night sweats, or weight loss. Denies any chest pain or hemoptysis. Denies significant cough. He denies changes with his shortness of breath. Denies any nausea, vomiting, constipation, or diarrhea. Denies any headache or visual changes. Denies any rashes or skin changes. The patient is here today for evaluation prior to starting cycle #13      MEDICAL HISTORY: Past Medical History:  Diagnosis Date   Allergic rhinitis    Asthma    Carotid stenosis    Colon cancer ([place]) 2003   Diabetes mellitus ([place])    Diverticulosis    Dyslipidemia    ED (erectile dysfunction)    GERD (gastroesophageal reflux disease)     H/O degenerative disc disease    Hemorrhoids    HTN (hypertension)    as a child   Hyperlipidemia    Kennedy cancer ([place])    LVH (left ventricular hypertrophy)    on EKG   Mass of lower lobe of left Kennedy    Mediastinal adenopathy    Smoker    former   Wears dentures    Wears dentures    Wears glasses    Wears glasses     ALLERGIES:  has No Known Allergies.  MEDICATIONS:  Current Outpatient Medications  Medication Sig Dispense Refill   Alcohol Swabs (DROPSAFE ALCOHOL PREP) 70 % PADS Apply topically.     amLODipine (NORVASC) 5 MG tablet Take 1 tablet (5 mg total) by mouth daily. 90 tablet 3   aspirin EC 81 MG tablet Take 81 mg by mouth daily after breakfast.      bisacodyl (DULCOLAX) 5 MG EC tablet Take 10 mg by mouth daily as needed for moderate constipation.     Blood Glucose Calibration (TRUE METRIX LEVEL 1) Low SOLN      cholecalciferol (VITAMIN D) 25 MCG (1000 UNIT) tablet Take 1,000 Units by mouth daily.     dronabinol (MARINOL) 2.5 MG capsule Take 1 capsule (2.5 mg total) by mouth 2 (two) times daily before a meal. (Patient not taking: Reported on 01/26/2021) 60 capsule 1   ferrous sulfate 325 (65 FE) MG EC tablet TAKE 1 TABLET (325 MG TOTAL) DAILY WITH BREAKFAST. 90 tablet 1   Lancet Devices (TRUEDRAW LANCING DEVICE) MISC Check sugars  daily 90 each 0   lisinopril-hydrochlorothiazide (ZESTORETIC) 20-12.5 MG tablet Take 1 tablet by mouth daily.     metFORMIN (GLUCOPHAGE-XR) 500 MG 24 hr tablet TAKE 1 TABLET EVERY DAY 90 tablet 1   Multiple Vitamin (MULTIVITAMIN WITH MINERALS) TABS tablet Take 1 tablet by mouth daily after breakfast.      oxyCODONE-acetaminophen (PERCOCET) 5-325 MG tablet Take 1 tablet by mouth every 4 (four) hours as needed for severe pain. (Patient not taking: Reported on 01/26/2021) 20 tablet 0   simvastatin (ZOCOR) 40 MG tablet Take 40 mg by mouth daily.     solifenacin (VESICARE) 5 MG tablet TAKE 1 TABLET EVERY DAY 90 tablet 1   TRUE METRIX BLOOD GLUCOSE  TEST test strip SMARTSIG:Via Meter     TRUEplus Lancets 33G MISC 1 each by Does not apply route 2 (two) times daily. 200 each 4   No current facility-administered medications for this visit.   Facility-Administered Medications Ordered in Other Visits  Medication Dose Route Frequency Provider Last Rate Last Admin   sodium chloride flush (NS) 0.9 % injection 10 mL  10 mL Intracatheter PRN Curt Bears, MD   10 mL at 09/04/19 1556    SURGICAL HISTORY:  Past Surgical History:  Procedure Laterality Date   BRONCHIAL BIOPSY  08/20/2019   Procedure: BRONCHIAL BIOPSIES;  Surgeon: Collene Gobble, MD;  Location[facility] ENDOSCOPY;  Service: Pulmonary;;   BRONCHIAL BRUSHINGS  08/20/2019   Procedure: BRONCHIAL BRUSHINGS;  Surgeon: Collene Gobble, MD;  Location: [facility] ENDOSCOPY;  Service: Pulmonary;;   BRONCHIAL NEEDLE ASPIRATION BIOPSY  08/20/2019   Procedure: BRONCHIAL NEEDLE ASPIRATION BIOPSIES;  Surgeon: Collene Gobble, MD;  Location: [place];  Service: Pulmonary;;   CARPAL TUNNEL RELEASE Right 01/09/2019   Procedure: CARPAL TUNNEL RELEASE;  Surgeon: Leandrew Koyanagi, MD;  Location: [place];  Service: Orthopedics;  Laterality: Right;   CARPAL TUNNEL WITH CUBITAL TUNNEL Right 11/18/2020   Procedure: CARPAL TUNNEL WITH CUBITAL TUNNEL;  Surgeon: Charlotte Crumb, MD;  Location: [place];  Service: Orthopedics;  Laterality: Right;   CATARACT EXTRACTION Right 2018   COLONOSCOPY  2007   Dr. Benson Norway   DIRECT LARYNGOSCOPY N/A 04/10/2020   Procedure: DIRECT LARYNGOSCOPY;  Surgeon: Melida Quitter, MD;  Location: [place];  Service: ENT;  Laterality: N/A;   I & D EXTREMITY Right 04/02/2016   Procedure: IRRIGATION AND DEBRIDEMENT GREAT TOE;  Surgeon: Leandrew Koyanagi, MD;  Location: [place];  Service: Orthopedics;  Laterality: Right;   IR IMAGING GUIDED PORT INSERTION  08/30/2019   MULTIPLE TOOTH EXTRACTIONS     RADIOACTIVE SEED IMPLANT  2003   TRACHEOSTOMY TUBE PLACEMENT N/A 04/10/2020   Procedure:  TRACHEOSTOMY;  Surgeon: Melida Quitter, MD;  Location: [place];  Service: ENT;  Laterality: N/A;   ULNAR TUNNEL RELEASE Right 01/09/2019   Procedure: RIGHT CUBITAL TUNNEL RELEASE AND CARPAL TUNNEL RELEASE;  Surgeon: Leandrew Koyanagi, MD;  Location: [place];  Service: Orthopedics;  Laterality: Right;   UPPER GASTROINTESTINAL ENDOSCOPY     VIDEO BRONCHOSCOPY N/A 12/18/2019   Procedure: VIDEO BRONCHOSCOPY WITHOUT FLUORO;  Surgeon: Collene Gobble, MD;  Location: [facility] ENDOSCOPY;  Service: Cardiopulmonary;  Laterality: N/A;   VIDEO BRONCHOSCOPY WITH ENDOBRONCHIAL ULTRASOUND N/A 08/20/2019   Procedure: VIDEO BRONCHOSCOPY WITH ENDOBRONCHIAL ULTRASOUND;  Surgeon: Collene Gobble, MD;  Location: [facility] ENDOSCOPY;  Service: Pulmonary;  Laterality: N/A;    REVIEW OF SYSTEMS:   Review of Systems  Constitutional: Negative for appetite change,  chills, fatigue, fever and unexpected weight change.  HENT:  Negative for mouth sores, nosebleeds, sore throat and trouble swallowing.   Eyes: Negative for eye problems and icterus.  Respiratory: Negative for hemoptysis, shortness of breath, cough, and wheezing.  Cardiovascular: Negative for chest pain and leg swelling.  Gastrointestinal: Negative for abdominal pain, constipation, diarrhea, nausea and vomiting.  Genitourinary: Negative for bladder incontinence, difficulty urinating, dysuria, frequency and hematuria.   Musculoskeletal: Negative for back pain, gait problem, neck pain and neck stiffness.  Skin: Negative for itching and rash.  Neurological: Negative for dizziness, extremity weakness, gait problem, headaches, light-headedness and seizures.  Hematological: Negative for adenopathy. Does not bruise/bleed easily.  Psychiatric/Behavioral: Negative for confusion, depression and sleep disturbance. The patient is not nervous/anxious.    PHYSICAL EXAMINATION:  Blood pressure (!) 156/72, pulse 63, temperature 97.8 F (36.6 C), temperature source Tympanic,  resp. rate 19, height 5' 7.5" (1.715 m), weight 185 lb 8 oz (84.1 kg), SpO2 98 %.  ECOG PERFORMANCE STATUS: 1  Physical Exam  Constitutional: Oriented to person, place, and time and well-developed, well-nourished, and in no distress.  Head: Normocephalic and atraumatic. Mouth/Throat: Oropharynx is clear and moist. No oropharyngeal exudate. Tracheostomy in place.  Eyes: Conjunctivae are normal. Right eye exhibits no discharge. Left eye exhibits no discharge. No scleral icterus. Neck: Normal range of motion. Neck supple. Cardiovascular: Normal rate, regular rhythm, normal heart sounds and intact distal pulses.   Pulmonary/Chest: Effort normal and breath sounds normal. No respiratory distress. No wheezes. No rales. Abdominal: Soft. Bowel sounds are normal. Exhibits no distension and no mass. There is no tenderness.  Musculoskeletal: Normal range of motion. Exhibits no edema.  Lymphadenopathy:    No cervical adenopathy.  Neurological: Alert and oriented to person, place, and time. Exhibits normal muscle tone. Gait normal. Coordination normal. Skin: Skin is warm and dry. No rash noted. Not diaphoretic. No erythema. No pallor.  Psychiatric: Mood, memory and judgment normal. Vitals reviewed  LABORATORY DATA: Lab Results  Component Value Date   WBC 5.3 02/04/2021   HGB 10.6 (L) 02/04/2021   HCT 31.6 (L) 02/04/2021   MCV 93.5 02/04/2021   PLT 275 02/04/2021      Chemistry      Component Value Date/Time   NA 140 02/04/2021 0756   NA 132 (L) 08/14/2019 0918   K 3.9 02/04/2021 0756   CL 107 02/04/2021 0756   CO2 27 02/04/2021 0756   BUN 19 02/04/2021 0756   BUN 16 08/14/2019 0918   CREATININE 1.34 (H) 02/04/2021 0756   CREATININE 1.25 (H) 08/08/2016 0912      Component Value Date/Time   CALCIUM 9.1 02/04/2021 0756   ALKPHOS 52 02/04/2021 0756   AST 13 (L) 02/04/2021 0756   ALT 10 02/04/2021 0756   BILITOT 0.5 02/04/2021 0756       RADIOGRAPHIC STUDIES:  No results  found.   ASSESSMENT/PLAN:  This is a very pleasant 80 year old [name] diagnosed with stage IV (T3, N2, M1a) non-small cell Kennedy cancer, squamous cell carcinoma.  He presented with a left lower lobe obstructive Kennedy mass in addition to mediastinal lymphadenopathy.  He also presented with bilateral pulmonary nodules.  He was diagnosed in July 2021.  His PD-L1 expression is negative.   He completed palliative radiotherapy to the obstructive Kennedy mass under the care of Dr. Lisbeth Renshaw in August 2021.   The patient is currently undergoing systemic chemotherapy with carboplatin and paclitaxel for 2 cycles in addition to immunotherapy with ipilimumab  1 mg/KG every 6 weeks and nivolumab 360 mg IV every 3 weeks.  He is status post 12 cycles.   The patient is currently undergoing systemic chemotherapy with carboplatin and paclitaxel for 2 cycles in addition to immunotherapy with ipilimumab 1 mg/KG every 6 weeks and nivolumab 360 mg IV every 3 weeks.  He is status post day 1 of cycle 8   Labs were reviewed. Recommend that he proceed with cycle #13 today as scheduled.   We will see him back for a follow up visit in 3 weeks for evaluation before starting cycle 13 day 22   He will continue to follow with ENT for his tracheostomy.   He will follow with general surgery for his hernia repair.   I will arrange for a restaging CT scan of the chest, abdomen, and pelvis prior to starting the next cycle of treatment.   The patient was advised to call immediately if he has any concerning symptoms in the interval. The patient voices understanding of current disease status and treatment options and is in agreement with the current care plan. All questions were answered. The patient knows to call the clinic with any problems, questions or concerns. We can certainly see the patient much sooner if necessary  Orders Placed This Encounter  Procedures   CT Chest W Contrast    Standing Status:   Future     Standing Expiration Date:   02/04/2022    Order Specific Question:   If indicated for the ordered procedure, I authorize the administration of contrast media per Radiology protocol    Answer:   Yes    Order Specific Question:   Preferred imaging location?    Answer:   [facility]   CT Abdomen Pelvis W Contrast    Standing Status:   Future    Standing Expiration Date:   02/04/2022    Order Specific Question:   If indicated for the ordered procedure, I authorize the administration of contrast media per Radiology protocol    Answer:   Yes    Order Specific Question:   Preferred imaging location?    Answer:   [facility]    Order Specific Question:   Is Oral Contrast requested for this exam?    Answer:   Yes, Per Radiology protocol     The total time spent in the appointment was 20-29 minutes.   Shaun Kachmar L Cyriah Childrey, PA-C 02/04/21

## 2021-02-02 ENCOUNTER — Ambulatory Visit: Payer: Medicare HMO | Admitting: Family Medicine

## 2021-02-03 ENCOUNTER — Telehealth: Payer: Self-pay

## 2021-02-03 ENCOUNTER — Encounter
Admission: RE | Admit: 2021-02-03 | Discharge: 2021-02-03 | Disposition: A | Discharge status: Home / Self Care | Payer: Medicare HMO | Source: Ambulatory Visit | Attending: Surgery | Admitting: Surgery

## 2021-02-03 ENCOUNTER — Other Ambulatory Visit: Payer: Self-pay

## 2021-02-03 NOTE — Patient Instructions (Signed)
Your procedure is scheduled on: 02/12/21 Report to [place]. To find out your arrival time please call [phone] between 1PM - 3PM on 02/11/21.  Remember: Instructions that are not followed completely may result in serious medical risk, up to and including death, or upon the discretion of your surgeon and anesthesiologist your surgery may need to be rescheduled.     _X__ 1. Do not eat food or drink any liquids  after midnight the night before your procedure.                 No gum chewing or hard candies.   __X__2.  On the morning of surgery brush your teeth with toothpaste and water, you                 may rinse your mouth with mouthwash if you wish.  Do not swallow any              toothpaste of mouthwash.     _X__ 3.  No Alcohol for 24 hours before or after surgery.   _X__ 4.  Do Not Smoke or use e-cigarettes For 24 Hours Prior to Your Surgery.                 Do not use any chewable tobacco products for at least 6 hours prior to                 surgery.  ____  5.  Bring all medications with you on the day of surgery if instructed.   __X__  6.  Notify your doctor if there is any change in your medical condition      (cold, fever, infections).     Do not wear jewelry, make-up, hairpins, clips or nail polish. Do not wear lotions, powders, or perfumes.  Do not shave body hair 48 hours prior to surgery. Men may shave face and neck. Do not bring valuables to the hospital.    [facility] is not responsible for any belongings or valuables.  Contacts, dentures/partials or body piercings may not be worn into surgery. Bring a case for your contacts, glasses or hearing aids, a denture cup will be supplied. Leave your suitcase in the car. After surgery it may be brought to your room. For patients admitted to the hospital, discharge time is determined by your treatment team.   Patients discharged the day of surgery will not be allowed  to drive home.   Please read over the following fact sheets that you were given:     __X__ Take these medicines the morning of surgery with A SIP OF WATER:    1. amLODipine (NORVASC) 5 MG tablet  2. simvastatin (ZOCOR) 40 MG tablet  3. solifenacin (VESICARE) 5 MG tablet  4.  5.  6.  ____ Fleet Enema (as directed)   __X__ Use CHG Soap/SAGE wipes as directed OR YOU MAY USE DIAL "GOLD" BAR NIGHT BEFORE AND MORNING OF SURGERY  ____ Use inhalers on the day of surgery  __X__ Stop metformin/Janumet/Farxiga 2 days prior to surgery HOLD METFORMIN 02/11/21 AND DAY OF SURGERY  ____ Take 1/2 of usual insulin dose the night before surgery. No insulin the morning          of surgery.   ____ Stop Blood Thinners Coumadin/Plavix/Xarelto/Pleta/Pradaxa/Eliquis/Effient/Aspirin  on   Or contact your Surgeon, Cardiologist or Medical Doctor regarding  ability to stop your blood thinners  __X__ Stop  Anti-inflammatories 7 days before surgery such as Advil, Ibuprofen, Motrin,  BC or Goodies Powder, Naprosyn, Naproxen, Aleve, Aspirin    __X__ Stop all herbals or supplements, fish oil or vitamins for 1 week until after surgery.  YOU MAY CONTINUE YOUR IRON DAILY UNTIL SURGERY  ____ Bring C-Pap to the hospital.

## 2021-02-03 NOTE — Telephone Encounter (Signed)
Sherry from Pre op called t# [phone] called & they need EKG on pt before surgery & wanted to see if could have here at appt.  Ok per [employer]

## 2021-02-04 ENCOUNTER — Encounter: Payer: Self-pay | Admitting: Family Medicine

## 2021-02-04 ENCOUNTER — Inpatient Hospital Stay: Payer: Medicare HMO

## 2021-02-04 ENCOUNTER — Ambulatory Visit (INDEPENDENT_AMBULATORY_CARE_PROVIDER_SITE_OTHER): Payer: Medicare HMO | Admitting: Family Medicine

## 2021-02-04 ENCOUNTER — Inpatient Hospital Stay (HOSPITAL_BASED_OUTPATIENT_CLINIC_OR_DEPARTMENT_OTHER): Payer: Medicare HMO | Admitting: Physician Assistant

## 2021-02-04 VITALS — BP 110/62 | HR 73 | Temp 98.5°F | Wt 186.4 lb

## 2021-02-04 VITALS — BP 156/72 | HR 63 | Temp 97.8°F | Resp 19 | Ht 67.5 in | Wt 185.5 lb

## 2021-02-04 DIAGNOSIS — Z01818 Encounter for other preprocedural examination: Secondary | ICD-10-CM

## 2021-02-04 DIAGNOSIS — E119 Type 2 diabetes mellitus without complications: Secondary | ICD-10-CM | POA: Diagnosis not present

## 2021-02-04 DIAGNOSIS — H18893 Other specified disorders of cornea, bilateral: Secondary | ICD-10-CM | POA: Diagnosis not present

## 2021-02-04 DIAGNOSIS — C3432 Malignant neoplasm of lower lobe, left bronchus or lung: Secondary | ICD-10-CM

## 2021-02-04 DIAGNOSIS — G3184 Mild cognitive impairment, so stated: Secondary | ICD-10-CM

## 2021-02-04 DIAGNOSIS — I440 Atrioventricular block, first degree: Secondary | ICD-10-CM | POA: Diagnosis not present

## 2021-02-04 DIAGNOSIS — Z5112 Encounter for antineoplastic immunotherapy: Secondary | ICD-10-CM

## 2021-02-04 DIAGNOSIS — Z95828 Presence of other vascular implants and grafts: Secondary | ICD-10-CM

## 2021-02-04 DIAGNOSIS — H40013 Open angle with borderline findings, low risk, bilateral: Secondary | ICD-10-CM | POA: Diagnosis not present

## 2021-02-04 DIAGNOSIS — Z7984 Long term (current) use of oral hypoglycemic drugs: Secondary | ICD-10-CM | POA: Diagnosis not present

## 2021-02-04 DIAGNOSIS — Z87891 Personal history of nicotine dependence: Secondary | ICD-10-CM | POA: Diagnosis not present

## 2021-02-04 DIAGNOSIS — J45909 Unspecified asthma, uncomplicated: Secondary | ICD-10-CM | POA: Diagnosis not present

## 2021-02-04 DIAGNOSIS — H25812 Combined forms of age-related cataract, left eye: Secondary | ICD-10-CM | POA: Diagnosis not present

## 2021-02-04 DIAGNOSIS — I10 Essential (primary) hypertension: Secondary | ICD-10-CM | POA: Diagnosis not present

## 2021-02-04 DIAGNOSIS — E785 Hyperlipidemia, unspecified: Secondary | ICD-10-CM | POA: Diagnosis not present

## 2021-02-04 DIAGNOSIS — Z7982 Long term (current) use of aspirin: Secondary | ICD-10-CM | POA: Diagnosis not present

## 2021-02-04 LAB — CBC WITH DIFFERENTIAL (CANCER CENTER ONLY)
Abs Immature Granulocytes: 0.01 10*3/uL (ref 0.00–0.07)
Basophils Absolute: 0 10*3/uL (ref 0.0–0.1)
Basophils Relative: 1 %
Eosinophils Absolute: 0.5 10*3/uL (ref 0.0–0.5)
Eosinophils Relative: 9 %
HCT: 31.6 % — ABNORMAL LOW (ref 39.0–52.0)
Hemoglobin: 10.6 g/dL — ABNORMAL LOW (ref 13.0–17.0)
Immature Granulocytes: 0 %
Lymphocytes Relative: 27 %
Lymphs Abs: 1.5 10*3/uL (ref 0.7–4.0)
MCH: 31.4 pg (ref 26.0–34.0)
MCHC: 33.5 g/dL (ref 30.0–36.0)
MCV: 93.5 fL (ref 80.0–100.0)
Monocytes Absolute: 0.5 10*3/uL (ref 0.1–1.0)
Monocytes Relative: 10 %
Neutro Abs: 2.8 10*3/uL (ref 1.7–7.7)
Neutrophils Relative %: 53 %
Platelet Count: 275 10*3/uL (ref 150–400)
RBC: 3.38 MIL/uL — ABNORMAL LOW (ref 4.22–5.81)
RDW: 12 % (ref 11.5–15.5)
WBC Count: 5.3 10*3/uL (ref 4.0–10.5)
nRBC: 0 % (ref 0.0–0.2)

## 2021-02-04 LAB — CMP (CANCER CENTER ONLY)
ALT: 10 U/L (ref 0–44)
AST: 13 U/L — ABNORMAL LOW (ref 15–41)
Albumin: 3.6 g/dL (ref 3.5–5.0)
Alkaline Phosphatase: 52 U/L (ref 38–126)
Anion gap: 6 (ref 5–15)
BUN: 19 mg/dL (ref 8–23)
CO2: 27 mmol/L (ref 22–32)
Calcium: 9.1 mg/dL (ref 8.9–10.3)
Chloride: 107 mmol/L (ref 98–111)
Creatinine: 1.34 mg/dL — ABNORMAL HIGH (ref 0.61–1.24)
GFR, Estimated: 53 mL/min — ABNORMAL LOW (ref >60)
Glucose, Bld: 88 mg/dL (ref 70–99)
Potassium: 3.9 mmol/L (ref 3.5–5.1)
Sodium: 140 mmol/L (ref 135–145)
Total Bilirubin: 0.5 mg/dL (ref 0.3–1.2)
Total Protein: 7.9 g/dL (ref 6.5–8.1)

## 2021-02-04 LAB — TSH: TSH: 1.711 u[IU]/mL (ref 0.320–4.118)

## 2021-02-04 LAB — HM DIABETES EYE EXAM

## 2021-02-04 MED ORDER — DIPHENHYDRAMINE HCL 50 MG/ML IJ SOLN
25.0000 mg | Freq: Once | INTRAMUSCULAR | Status: AC
  Administered 2021-02-04: 10:00:00 25 mg via INTRAVENOUS
  Filled 2021-02-04: qty 1

## 2021-02-04 MED ORDER — SODIUM CHLORIDE 0.9% FLUSH
10.0000 mL | Freq: Once | INTRAVENOUS | Status: AC
  Administered 2021-02-04: 08:00:00 10 mL

## 2021-02-04 MED ORDER — SODIUM CHLORIDE 0.9 % IV SOLN
360.0000 mg | Freq: Once | INTRAVENOUS | Status: AC
  Administered 2021-02-04: 11:00:00 360 mg via INTRAVENOUS
  Filled 2021-02-04: qty 24

## 2021-02-04 MED ORDER — SODIUM CHLORIDE 0.9 % IV SOLN
Freq: Once | INTRAVENOUS | Status: AC
Start: 2021-02-04 — End: 2021-02-04

## 2021-02-04 MED ORDER — HEPARIN SOD (PORK) LOCK FLUSH 100 UNIT/ML IV SOLN
500.0000 [IU] | Freq: Once | INTRAVENOUS | Status: AC | PRN
  Administered 2021-02-04: 12:00:00 500 [IU]

## 2021-02-04 MED ORDER — FAMOTIDINE 20 MG IN NS 100 ML IVPB
20.0000 mg | Freq: Once | INTRAVENOUS | Status: AC
  Administered 2021-02-04: 10:00:00 20 mg via INTRAVENOUS
  Filled 2021-02-04: qty 100

## 2021-02-04 MED ORDER — SODIUM CHLORIDE 0.9% FLUSH
10.0000 mL | INTRAVENOUS | Status: DC | PRN
  Administered 2021-02-04: 12:00:00 10 mL

## 2021-02-04 MED ORDER — SODIUM CHLORIDE 0.9 % IV SOLN
1.0000 mg/kg | Freq: Once | INTRAVENOUS | Status: AC
  Administered 2021-02-04: 12:00:00 80 mg via INTRAVENOUS
  Filled 2021-02-04: qty 16

## 2021-02-04 NOTE — Patient Instructions (Signed)
[facility]  Discharge Instructions: Thank you for choosing [facility] to provide your oncology and hematology care.   If you have a lab appointment with the [facility], please go directly to the [facility] and check in at the registration area.   Wear comfortable clothing and clothing appropriate for easy access to any Portacath or PICC line.   We strive to give you quality time with your provider. You may need to reschedule your appointment if you arrive late (15 or more minutes).  Arriving late affects you and other patients whose appointments are after yours.  Also, if you miss three or more appointments without notifying the office, you may be dismissed from the clinic at the providers discretion.      For prescription refill requests, have your pharmacy contact our office and allow 72 hours for refills to be completed.    Today you received the following chemotherapy and/or immunotherapy agents opdivo/yervoy      To help prevent nausea and vomiting after your treatment, we encourage you to take your nausea medication as directed.  BELOW ARE SYMPTOMS THAT SHOULD BE REPORTED IMMEDIATELY: *FEVER GREATER THAN 100.4 F (38 C) OR HIGHER *CHILLS OR SWEATING *NAUSEA AND VOMITING THAT IS NOT CONTROLLED WITH YOUR NAUSEA MEDICATION *UNUSUAL SHORTNESS OF BREATH *UNUSUAL BRUISING OR BLEEDING *URINARY PROBLEMS (pain or burning when urinating, or frequent urination) *BOWEL PROBLEMS (unusual diarrhea, constipation, pain near the anus) TENDERNESS IN MOUTH AND THROAT WITH OR WITHOUT PRESENCE OF ULCERS (sore throat, sores in mouth, or a toothache) UNUSUAL RASH, SWELLING OR PAIN  UNUSUAL VAGINAL DISCHARGE OR ITCHING   Items with * indicate a potential emergency and should be followed up as soon as possible or go to the Emergency Department if any problems should occur.  Please show the CHEMOTHERAPY ALERT CARD or IMMUNOTHERAPY ALERT CARD at check-in  to the Emergency Department and triage nurse.  Should you have questions after your visit or need to cancel or reschedule your appointment, please contact [facility]  Dept: [phone]  and follow the prompts.  Office hours are 8:00 a.m. to 4:30 p.m. Monday - Friday. Please note that voicemails left after 4:00 p.m. may not be returned until the following business day.  We are closed weekends and major holidays. You have access to a nurse at all times for urgent questions. Please call the main number to the clinic Dept: [phone] and follow the prompts.   For any non-urgent questions, you may also contact your provider using MyChart. We now offer e-Visits for anyone 73 and older to request care online for non-urgent symptoms. For details visit [email or web address].   Also download the MyChart app! Go to the app store, search "MyChart", open the app, select [facility], and log in with your MyChart username and password.  Due to Covid, a mask is required upon entering the hospital/clinic. If you do not have a mask, one will be given to you upon arrival. For doctor visits, patients may have 1 support person aged 58 or older with them. For treatment visits, patients cannot have anyone with them due to current Covid guidelines and our immunocompromised population.

## 2021-02-04 NOTE — Progress Notes (Signed)
° °  Subjective:    Patient ID: Shaun Kennedy, male    DOB: March 23, 1940, 80 y.o.   MRN: [id number]  HPI He is here for a preoperative evaluation prior to hernia repair.  He has been seen by oncology and cleared by them.  His wife has concerns over his mental status.  She has noted that he has become quite forgetful and he actually recognizes that this is also the case.  He notes this especially after he has had radiation.  Notably does not make him tired but it also seems to affect his memory.  He does not complain of blurred vision, double vision, weakness, numbness or tingling.   Review of Systems     Objective:   Physical Exam Alert and in no distress.  EOMI.  Other cranial nerves grossly intact.  DTRs normal.  Tympanic membranes and canals are normal. Pharyngeal area is normal. Neck is supple without adenopathy or thyromegaly. Cardiac exam shows a regular sinus rhythm without murmurs or gallops. Lungs are clear to auscultation. MMSE of 23 EKG read by me shows evidence of a first-degree block with a rate of 68 and other parameters being normal.       Assessment & Plan:  Pre-operative exam - Plan: EKG 12-Lead  Mild cognitive impairment - Plan: Ambulatory referral to Neurology  First degree heart block  Malignant neoplasm of lower lobe of left lung ([place]) I discussed the fact that it does look like he has mild cognitive impairment.  He seemed understand this.  I will go ahead and refer to neurology to to get their input into this especially with his underlying history of lung cancer. First-degree heart block I do not think is of any concern especially for surgery.

## 2021-02-04 NOTE — Progress Notes (Signed)

## 2021-02-05 ENCOUNTER — Telehealth: Payer: Self-pay

## 2021-02-05 NOTE — Telephone Encounter (Signed)
Medical Clearance received from [facility] -patient optimized for surgery.

## 2021-02-09 ENCOUNTER — Encounter: Payer: Self-pay | Admitting: Surgery

## 2021-02-09 ENCOUNTER — Ambulatory Visit (INDEPENDENT_AMBULATORY_CARE_PROVIDER_SITE_OTHER): Payer: Medicare HMO | Admitting: Surgery

## 2021-02-09 ENCOUNTER — Other Ambulatory Visit: Payer: Self-pay

## 2021-02-09 ENCOUNTER — Ambulatory Visit: Payer: Medicare HMO | Admitting: Physician Assistant

## 2021-02-09 ENCOUNTER — Ambulatory Visit: Payer: Self-pay | Admitting: Surgery

## 2021-02-09 VITALS — BP 138/75 | HR 69 | Temp 99.3°F | Ht 67.0 in | Wt 185.2 lb

## 2021-02-09 DIAGNOSIS — K4090 Unilateral inguinal hernia, without obstruction or gangrene, not specified as recurrent: Secondary | ICD-10-CM | POA: Diagnosis not present

## 2021-02-09 NOTE — Progress Notes (Deleted)
Assessment/Plan:   Shaun Kennedy is a very pleasant 81 y.o. year old RH male with risk factors including  age, DM2, hypertension, hyperlipidemia, carotid stenosis, first degree HB , and recent diagnosis of  St 4 LLL  NSC L carcinoma on Yervoy, paclitaxel and carboplatin last dose 02/04/21, chronic anemia, seen today for evaluation of memory loss. MoCA today     Recommendations:   MCI  likely mixed vascular and other etiologies (chemotherapy, anemia)  MRI brain with/without contrast to assess for underlying structural abnormality and assess vascular load  Neurocognitive testing to further evaluate cognitive concerns and determine underlying cause of memory changes, including potential contribution from sleep, anxiety, or depression  Check B12 Discussed safety both in and out of the home.  Discussed the importance of regular daily schedule with inclusion of crossword puzzles to maintain brain function.  Continue to monitor mood with PCP.  Stay active at least 30 minutes at least 3 times a week.  Naps should be scheduled and should be no longer than 60 minutes and should not occur after 2 PM.  Control cardiovascular risk factors  Mediterranean diet is recommended  Folllow up once results above are available   Subjective:    The patient is seen in neurologic consultation at the request of [name], MD for the evaluation of memory.  The patient is accompanied by  who supplements the history. This is a 81 y.o. year old RH  male who has had memory issues for about     Memory Repeats same stories and asks same questions Does not know what came to the room for Leaving objects  Drive Lives with Mood Depression Irritability CW puzzles Word Finding Board Games Painting Coloring Sleeps Vivid Dreams Sleepwalking Hallucinations Paranoia Hygiene concerns Bathing Dressing Medications pillbox Finances  Appetite  trouble swallowing.  Cooks.  stove on or the faucet  on. Ambulates   Falls Head injuries    Denies headaches, double vision, dizziness, focal numbness or tingling, unilateral weakness or tremors or anosmia. No history of seizures. Denies urine incontinence, retention, constipation or diarrhea.  Denies OSA, ETOH or Tobacco. Family History   Labs 02/04/21 TSH 1.711, H/H 10.6/31/6, Cr 1.34,  AST 13 (chemo)  MRI brain w and wo contrast 09/06/19 extensive chronic small vessel ischemia, normal brain volume, no acute findings, no mets  No Known Allergies  Current Outpatient Medications  Medication Instructions   Alcohol Swabs (DROPSAFE ALCOHOL PREP) 70 % PADS Topical   amLODipine (NORVASC) 5 mg, Oral, Daily   aspirin EC 81 mg, Oral, Daily after breakfast   bisacodyl (DULCOLAX) 10 mg, Oral, Daily PRN   Blood Glucose Calibration (TRUE METRIX LEVEL 1) Low SOLN No dose, route, or frequency recorded.   cholecalciferol (VITAMIN D) 1,000 Units, Oral, Daily   dronabinol (MARINOL) 2.5 mg, Oral, 2 times daily before meals   ferrous sulfate 325 (65 FE) MG EC tablet TAKE 1 TABLET (325 MG TOTAL) DAILY WITH BREAKFAST.   Lancet Devices (TRUEDRAW LANCING DEVICE) MISC Check sugars daily   lisinopril-hydrochlorothiazide (ZESTORETIC) 20-12.5 MG tablet 1 tablet, Oral, Daily   metFORMIN (GLUCOPHAGE-XR) 500 MG 24 hr tablet TAKE 1 TABLET EVERY DAY   Multiple Vitamin (MULTIVITAMIN WITH MINERALS) TABS tablet 1 tablet, Oral, Daily after breakfast   oxyCODONE-acetaminophen (PERCOCET) 5-325 MG tablet 1 tablet, Oral, Every 4 hours PRN   simvastatin (ZOCOR) 40 mg, Oral, Daily   solifenacin (VESICARE) 5 MG tablet TAKE 1 TABLET EVERY DAY   TRUE METRIX BLOOD  GLUCOSE TEST test strip SMARTSIG:Via Meter   TRUEplus Lancets 33G MISC 1 each, Does not apply, 2 times daily     VITALS:  There were no vitals filed for this visit. Depression screen [facility] 2/9 04/27/2020 10/28/2019  Decreased Interest 0 0  Down, Depressed, Hopeless 0 0  PHQ - 2 Score 0 0  Some recent data might be  hidden    PHYSICAL EXAM   HEENT:  Normocephalic, atraumatic. The mucous membranes are moist. The superficial temporal arteries are without ropiness or tenderness. Cardiovascular: Regular rate and rhythm. Lungs: Clear to auscultation bilaterally. Neck: There are no carotid bruits noted bilaterally.  NEUROLOGICAL: No flowsheet data found. No flowsheet data found.  No flowsheet data found.   Orientation:  Alert and oriented to person, place and time. No aphasia or dysarthria. Fund of knowledge is appropriate. Recent memory impaired and remote memory intact.  Attention and concentration are normal.  Able to name objects and repeat phrases. Delayed recall  /5 Cranial nerves: There is good facial symmetry. Extraocular muscles are intact and visual fields are full to confrontational testing. Speech is fluent and clear. Soft palate rises symmetrically and there is no tongue deviation. Hearing is intact to conversational tone. Tone: Tone is good throughout. Sensation: Sensation is intact to light touch and pinprick throughout. Vibration is intact at the bilateral big toe.There is no extinction with double simultaneous stimulation. There is no sensory dermatomal level identified. Coordination: The patient has no difficulty with RAM's or FNF bilaterally. Normal finger to nose  Motor: Strength is 5/5 in the bilateral upper and lower extremities. There is no pronator drift. There are no fasciculations noted. DTR's: Deep tendon reflexes are 2/4 at the bilateral biceps, triceps, brachioradialis, patella and achilles.  Plantar responses are downgoing bilaterally. Gait and Station: The patient is able to ambulate without difficulty.The patient is able to heel toe walk without any difficulty.The patient is able to ambulate in a tandem fashion. The patient is able to stand in the Romberg position.     Thank you for allowing [place] the opportunity to participate in the care of this nice patient. Please do not  hesitate to contact us for any questions or concerns.   Total time spent on today's visit was 60 minutes, including both face-to-face time and nonface-to-face time.  Time included that spent on review of records (prior notes available to me/labs/imaging if pertinent), discussing treatment and goals, answering patient's questions and coordinating care.  Cc:  [name], MD  Sharene Butters 02/09/2021 7:36 AM

## 2021-02-09 NOTE — Progress Notes (Signed)
Patient ID: Shaun Kennedy, male   DOB: 1940-06-02, 81 y.o.   MRN: [id number]  Chief Complaint: Right inguinal hernia  History of Present Illness He returns today for preop evaluation, he has received his medical clearance.  Reports he is otherwise doing well. Shaun Kennedy is a 81 y.o. male with a progressively enlarging right groin mass, minimally discomforting.  He denies any bowel habit changes, reports urinary incontinence, with urinary urgency.  No prior abdominal surgery, nor prior hernia surgery.  Has a tracheostomy for non-small cell lung cancer, currently on treatment for the same.  Reports that he remains quite active at home, the hernia appears to be more pronounced while he is upright and walking.  He reports he continues to walk significantly on a regular basis.  Had to give up golf due to his grip, but recently had carpal tunnel and ulnar tunnel releases.  He presents with his wife today who is quite attentive and involved with decision-making process.  Past Medical History Past Medical History:  Diagnosis Date   Allergic rhinitis    Asthma    Carotid stenosis    Colon cancer ([place]) 2003   Diabetes mellitus ([place])    Diverticulosis    Dyslipidemia    ED (erectile dysfunction)    GERD (gastroesophageal reflux disease)    H/O degenerative disc disease    Hemorrhoids    HTN (hypertension)    as a child   Hyperlipidemia    Lung cancer ([place])    LVH (left ventricular hypertrophy)    on EKG   Mass of lower lobe of left lung    Mediastinal adenopathy    Smoker    former   Wears dentures    Wears dentures    Wears glasses    Wears glasses       Past Surgical History:  Procedure Laterality Date   BRONCHIAL BIOPSY  08/20/2019   Procedure: BRONCHIAL BIOPSIES;  Surgeon: Collene Gobble, MD;  Location: [facility] ENDOSCOPY;  Service: Pulmonary;;   BRONCHIAL BRUSHINGS  08/20/2019   Procedure: BRONCHIAL BRUSHINGS;  Surgeon: Collene Gobble, MD;  Location[name] ENDOSCOPY;  Service:  Pulmonary;;   BRONCHIAL NEEDLE ASPIRATION BIOPSY  08/20/2019   Procedure: BRONCHIAL NEEDLE ASPIRATION BIOPSIES;  Surgeon: Collene Gobble, MD;  Location: [facility] ENDOSCOPY;  Service: Pulmonary;;   CARPAL TUNNEL RELEASE Right 01/09/2019   Procedure: CARPAL TUNNEL RELEASE;  Surgeon: Leandrew Koyanagi, MD;  Location: [facility];  Service: Orthopedics;  Laterality: Right;   CARPAL TUNNEL WITH CUBITAL TUNNEL Right 11/18/2020   Procedure: CARPAL TUNNEL WITH CUBITAL TUNNEL;  Surgeon: Charlotte Crumb, MD;  Location: [facility];  Service: Orthopedics;  Laterality: Right;   CATARACT EXTRACTION Right 2018   COLONOSCOPY  2007   Dr. Benson Norway   DIRECT LARYNGOSCOPY N/A 04/10/2020   Procedure: DIRECT LARYNGOSCOPY;  Surgeon: Melida Quitter, MD;  Location: [facility];  Service: ENT;  Laterality: N/A;   I & D EXTREMITY Right 04/02/2016   Procedure: IRRIGATION AND DEBRIDEMENT GREAT TOE;  Surgeon: Leandrew Koyanagi, MD;  Location: [facility];  Service: Orthopedics;  Laterality: Right;   IR IMAGING GUIDED PORT INSERTION  08/30/2019   MULTIPLE TOOTH EXTRACTIONS     RADIOACTIVE SEED IMPLANT  2003   TRACHEOSTOMY TUBE PLACEMENT N/A 04/10/2020   Procedure: TRACHEOSTOMY;  Surgeon: Melida Quitter, MD;  Location: [facility];  Service: ENT;  Laterality: N/A;   ULNAR TUNNEL RELEASE Right 01/09/2019   Procedure: RIGHT CUBITAL TUNNEL RELEASE AND CARPAL  TUNNEL RELEASE;  Surgeon: Leandrew Koyanagi, MD;  Location: [facility];  Service: Orthopedics;  Laterality: Right;   UPPER GASTROINTESTINAL ENDOSCOPY     VIDEO BRONCHOSCOPY N/A 12/18/2019   Procedure: VIDEO BRONCHOSCOPY WITHOUT FLUORO;  Surgeon: Collene Gobble, MD;  Location: [facility] ENDOSCOPY;  Service: Cardiopulmonary;  Laterality: N/A;   VIDEO BRONCHOSCOPY WITH ENDOBRONCHIAL ULTRASOUND N/A 08/20/2019   Procedure: VIDEO BRONCHOSCOPY WITH ENDOBRONCHIAL ULTRASOUND;  Surgeon: Collene Gobble, MD;  Location: [facility] ENDOSCOPY;  Service: Pulmonary;  Laterality: N/A;    No Known Allergies  Current  Outpatient Medications  Medication Sig Dispense Refill   Alcohol Swabs (DROPSAFE ALCOHOL PREP) 70 % PADS Apply topically.     amLODipine (NORVASC) 5 MG tablet Take 1 tablet (5 mg total) by mouth daily. 90 tablet 3   aspirin EC 81 MG tablet Take 81 mg by mouth daily after breakfast.      bisacodyl (DULCOLAX) 5 MG EC tablet Take 10 mg by mouth daily as needed for moderate constipation.     Blood Glucose Calibration (TRUE METRIX LEVEL 1) Low SOLN      cholecalciferol (VITAMIN D) 25 MCG (1000 UNIT) tablet Take 1,000 Units by mouth daily.     dronabinol (MARINOL) 2.5 MG capsule Take 1 capsule (2.5 mg total) by mouth 2 (two) times daily before a meal. 60 capsule 1   ferrous sulfate 325 (65 FE) MG EC tablet TAKE 1 TABLET (325 MG TOTAL) DAILY WITH BREAKFAST. 90 tablet 1   Lancet Devices (TRUEDRAW LANCING DEVICE) MISC Check sugars daily 90 each 0   lisinopril-hydrochlorothiazide (ZESTORETIC) 20-12.5 MG tablet Take 1 tablet by mouth daily.     metFORMIN (GLUCOPHAGE-XR) 500 MG 24 hr tablet TAKE 1 TABLET EVERY DAY 90 tablet 1   Multiple Vitamin (MULTIVITAMIN WITH MINERALS) TABS tablet Take 1 tablet by mouth daily after breakfast.      simvastatin (ZOCOR) 40 MG tablet Take 40 mg by mouth daily.     solifenacin (VESICARE) 5 MG tablet TAKE 1 TABLET EVERY DAY 90 tablet 1   TRUE METRIX BLOOD GLUCOSE TEST test strip SMARTSIG:Via Meter     TRUEplus Lancets 33G MISC 1 each by Does not apply route 2 (two) times daily. 200 each 4   No current facility-administered medications for this visit.   Facility-Administered Medications Ordered in Other Visits  Medication Dose Route Frequency Provider Last Rate Last Admin   sodium chloride flush (NS) 0.9 % injection 10 mL  10 mL Intracatheter PRN Curt Bears, MD   10 mL at 09/04/19 1556    Family History Family History  Problem Relation Age of Onset   Heart failure Mother    Diabetes Mother    Stroke Father    Diabetes Father    Diabetes Sister    Diabetes  Sister    Lung cancer Sister 31   Colon cancer Neg Hx    Esophageal cancer Neg Hx    Rectal cancer Neg Hx    Colon polyps Neg Hx    Stomach cancer Neg Hx       Social History Social History   Tobacco Use   Smoking status: Former    Packs/day: 1.00    Years: 32.00    Pack years: 32.00    Types: Cigarettes    Start date: 02/08/1955    Quit date: 10/08/1985    Years since quitting: 35.3   Smokeless tobacco: Never  Vaping Use   Vaping Use: Never used  Substance Use Topics  Alcohol use: No   Drug use: No       Review of Systems  Constitutional: Negative.   HENT:  Positive for hearing loss.   Eyes: Negative.   Respiratory: Negative.    Cardiovascular: Negative.   Gastrointestinal:  Positive for constipation.  Genitourinary:  Positive for frequency and urgency.  Skin: Negative.   Neurological: Negative.   Psychiatric/Behavioral: Negative.       Physical Exam Blood pressure 138/75, pulse 69, temperature 99.3 F (37.4 C), temperature source Oral, height 5\' 7"  (1.702 m), weight 185 lb 3.2 oz (84 kg), SpO2 99 %. Last Weight  Most recent update: 02/09/2021  9:25 AM    Weight  84 kg (185 lb 3.2 oz)             CONSTITUTIONAL: Well developed, and nourished, appropriately responsive and aware without distress.   EYES: Sclera non-icteric.   EARS, NOSE, MOUTH AND THROAT: Mask worn.  NECK: Trachea is midline with tracheostomy in place, and there is no jugular venous distension.  LYMPH NODES:  Lymph nodes in the neck are not appreciated. RESPIRATORY:  Breath sounds are equal bilaterally. Normal respiratory effort without pathologic use of accessory muscles. CARDIOVASCULAR: Heart is regular in rate and rhythm. GI: The abdomen is soft, nontender, and nondistended. There were no palpable masses. I did not appreciate hepatosplenomegaly. There were normal bowel sounds. GU: Readily prominent large right inguinal hernia, readily reducible in supine position.  Radiographic no left  inguinal hernia not evaluated, currently not symptomatic. MUSCULOSKELETAL:  Symmetrical muscle tone appreciated in all four extremities.    SKIN: Skin turgor is normal. No pathologic skin lesions appreciated.  NEUROLOGIC:  Motor and sensation appear grossly normal.  Cranial nerves are grossly without defect. PSYCH:  Alert and oriented to person, place and time. Affect is appropriate for situation.  Data Reviewed I have personally reviewed what is currently available of the patient's imaging, recent labs and medical records.   Labs:  CBC Latest Ref Rng & Units 02/04/2021 01/13/2021 12/23/2020  WBC 4.0 - 10.5 K/uL 5.3 4.9 5.3  Hemoglobin 13.0 - 17.0 g/dL 10.6(L) 10.4(L) 11.0(L)  Hematocrit 39.0 - 52.0 % 31.6(L) 30.6(L) 32.5(L)  Platelets 150 - 400 K/uL 275 268 249   CMP Latest Ref Rng & Units 02/04/2021 01/13/2021 12/23/2020  Glucose 70 - 99 mg/dL 88 113(H) 108(H)  BUN 8 - 23 mg/dL 19 23 17   Creatinine 0.61 - 1.24 mg/dL 1.34(H) 1.30(H) 1.29(H)  Sodium 135 - 145 mmol/L 140 140 139  Potassium 3.5 - 5.1 mmol/L 3.9 3.9 4.0  Chloride 98 - 111 mmol/L 107 107 106  CO2 22 - 32 mmol/L 27 24 24   Calcium 8.9 - 10.3 mg/dL 9.1 8.8(L) 8.9  Total Protein 6.5 - 8.1 g/dL 7.9 7.6 7.8  Total Bilirubin 0.3 - 1.2 mg/dL 0.5 0.6 0.7  Alkaline Phos 38 - 126 U/L 52 62 61  AST 15 - 41 U/L 13(L) 12(L) 13(L)  ALT 0 - 44 U/L 10 9 10    For the left which is followed for 43-month   Imaging: Radiology review:   CT imaging from July and October reviewed.   Assessment    Large symptomatic right inguinal hernia. Patient Active Problem List   Diagnosis Date Noted   Mild cognitive impairment 02/04/2021   First degree heart block 02/04/2021   Right inguinal hernia 01/14/2021   Trachea, stenosis    Tracheostomy status ([place])    Left lower lobe pneumonia 04/20/2020   Pressure injury of  skin 04/20/2020   Pleuritic chest pain 03/17/2020   Chemotherapy-induced neuropathy ([place]) 02/12/2020   Vocal cord dysfunction  02/12/2020   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   Stridor 12/18/2019   Abnormal findings on diagnostic imaging of lung 11/06/2019   Antineoplastic chemotherapy induced anemia 10/16/2019   Shortness of breath 10/15/2019   Odynophagia 09/25/2019   Port-A-Cath in place 09/17/2019   Decreased appetite 09/03/2019   Malignant neoplasm of lower lobe of left lung ([place]) 08/28/2019   Encounter for antineoplastic chemotherapy 08/24/2019   Encounter for antineoplastic immunotherapy 08/24/2019   Goals of care, counseling/discussion 08/24/2019   Atherosclerosis of aorta ([place]) 04/09/2019   Carpal tunnel syndrome, left upper limb 02/19/2019   History of carpal tunnel surgery of right wrist 12/21/2018   OAB (overactive bladder) 10/11/2017   Diabetic nephropathy associated with type 2 diabetes mellitus ([employer]) 10/11/2017   Onychomycosis of multiple toenails with type 2 diabetes mellitus ([place]) 07/22/2015   Former smoker, stopped smoking in distant past 07/22/2015   Diabetes mellitus type II, non insulin dependent ([place]) 06/13/2011   ED (erectile dysfunction) 06/13/2011   History of prostate cancer 06/13/2011   Hypertension associated with diabetes ([place]) 06/13/2011   Hyperlipidemia associated with type 2 diabetes mellitus ([place]) 06/13/2011   Allergic rhinitis, mild 06/13/2011   History of asthma 06/13/2011   Carotid stenosis 06/13/2011    Plan    Robotic right inguinal hernia repair.   Although a fat filled left inguinal hernia was noted on CT scan, he is not symptomatic on that side.  And considering his age I do not feel it is worth repairing unless there is an obvious hernia sac present. I discussed possibility of incarceration, strangulation, enlargement in size over time, and the need for emergency surgery in the face of these.  Also reviewed the techniques of reduction should incarceration occur, and when unsuccessful to present to the ED.  Also discussed that surgery risks include recurrence which can be  up to 30% in the case of complex hernias, use of prosthetic materials (mesh) and the increased risk of infection and the possible need for re-operation and removal of mesh, possibility of post-op SBO or ileus, and the risks of general anesthetic including heart attack, stroke, sudden death or some reaction to anesthetic medications. The patient, and those present, appear to understand the risks, any and all questions were answered to the patient's satisfaction.  No guarantees were ever expressed or implied.    These notes generated with voice recognition software. I apologize for typographical errors.  Ronny Bacon M.D., FACS 02/09/2021, 9:38 AM

## 2021-02-09 NOTE — Patient Instructions (Addendum)
Please call the number on the [facility] surgery sheet the day before your surgery between 1-3 pm.    Inguinal Hernia, Adult An inguinal hernia is when fat or your intestines push through a weak spot in a muscle where your leg meets your lower belly (groin). This causes a bulge. This kind of hernia could also be: In your scrotum, if you are male. In folds of skin around your vagina, if you are male. There are three types of inguinal hernias: Hernias that can be pushed back into the belly (are reducible). This type rarely causes pain. Hernias that cannot be pushed back into the belly (are incarcerated). Hernias that cannot be pushed back into the belly and lose their blood supply (are strangulated). This type needs emergency surgery. What are the causes? This condition is caused by having a weak spot in the muscles or tissues in your groin. This develops over time. The hernia may poke through the weak spot when you strain your lower belly muscles all of a sudden, such as when you: Lift a heavy object. Strain to poop (have a bowel movement). Trouble pooping (constipation) can lead to straining. Cough. What increases the risk? This condition is more likely to develop in: Males. Pregnant females. People who: Are overweight. Work in jobs that require long periods of standing or heavy lifting. Have had an inguinal hernia before. Smoke or have lung disease. These factors can lead to long-term (chronic) coughing. What are the signs or symptoms? Symptoms may depend on the size of the hernia. Often, a small hernia has no symptoms. Symptoms of a larger hernia may include: A bulge in the groin area. This is easier to see when standing. You might not be able to see it when you are lying down. Pain or burning in the groin. This may get worse when you lift, strain, or cough. A dull ache or a feeling of pressure in the groin. An abnormal bulge in the scrotum, in males. Symptoms of a strangulated inguinal  hernia may include: A bulge in your groin that is very painful and tender to the touch. A bulge that turns red or purple. Fever, feeling like you may vomit (nausea), and vomiting. Not being able to poop or to pass gas. How is this treated? Treatment depends on the size of your hernia and whether you have symptoms. If you do not have symptoms, your doctor may have you watch your hernia carefully and have you come in for follow-up visits. If your hernia is large or if you have symptoms, you may need surgery to repair the hernia. Follow these instructions at home: Lifestyle Avoid lifting heavy objects. Avoid standing for long amounts of time. Do not smoke or use any products that contain nicotine or tobacco. If you need help quitting, ask your doctor. Stay at a healthy weight. Prevent trouble pooping You may need to take these actions to prevent or treat trouble pooping: Drink enough fluid to keep your pee (urine) pale yellow. Take over-the-counter or prescription medicines. Eat foods that are high in fiber. These include beans, whole grains, and fresh fruits and vegetables. Limit foods that are high in fat and sugar. These include fried or sweet foods. General instructions You may try to push your hernia back in place by very gently pressing on it when you are lying down. Do not try to push the bulge back in if it will not go in easily. Watch your hernia for any changes in shape, size, or color.  Tell your doctor if you see any changes. Take over-the-counter and prescription medicines only as told by your doctor. Keep all follow-up visits. Contact a doctor if: You have a fever or chills. You have new symptoms. Your symptoms get worse. Get help right away if: You have pain in your groin that gets worse all of a sudden. You have a bulge in your groin that: Gets bigger all of a sudden, and it does not get smaller after that. Turns red or purple. Is painful when you touch it. You are a  male, and you have: Sudden pain in your scrotum. A sudden change in the size of your scrotum. You cannot push the hernia back in place by very gently pressing on it when you are lying down. You feel like you may vomit, and that feeling does not go away. You keep vomiting. You have a fast heartbeat. You cannot poop or pass gas. These symptoms may be an emergency. Get help right away. Call your local emergency services (911 in the [place] Do not wait to see if the symptoms will go away. Do not drive yourself to the hospital. Summary An inguinal hernia is when fat or your intestines push through a weak spot in a muscle where your leg meets your lower belly (groin). This causes a bulge. If you do not have symptoms, you may not need treatment. If you have symptoms or a large hernia, you may need surgery. Avoid lifting heavy objects. Also, avoid standing for long amounts of time. Do not try to push the bulge back in if it will not go in easily. This information is not intended to replace advice given to you by your health care provider. Make sure you discuss any questions you have with your health care provider. Document Revised: 09/24/2019 Document Reviewed: 09/24/2019 Elsevier Patient Education  2022 [employer].

## 2021-02-09 NOTE — H&P (View-Only) (Signed)
Patient ID: Shaun Kennedy, male   DOB: 02/12/1940, 81 y.o.   MRN: [id number]  Chief Complaint: Right inguinal hernia  History of Present Illness He returns today for preop evaluation, he has received his medical clearance.  Reports he is otherwise doing well. Shaun Kennedy is a 81 y.o. male with a progressively enlarging right groin mass, minimally discomforting.  He denies any bowel habit changes, reports urinary incontinence, with urinary urgency.  No prior abdominal surgery, nor prior hernia surgery.  Has a tracheostomy for non-small cell lung cancer, currently on treatment for the same.  Reports that he remains quite active at home, the hernia appears to be more pronounced while he is upright and walking.  He reports he continues to walk significantly on a regular basis.  Had to give up golf due to his grip, but recently had carpal tunnel and ulnar tunnel releases.  He presents with his wife today who is quite attentive and involved with decision-making process.  Past Medical History Past Medical History:  Diagnosis Date   Allergic rhinitis    Asthma    Carotid stenosis    Colon cancer ([place]) 2003   Diabetes mellitus ([place])    Diverticulosis    Dyslipidemia    ED (erectile dysfunction)    GERD (gastroesophageal reflux disease)    H/O degenerative disc disease    Hemorrhoids    HTN (hypertension)    as a child   Hyperlipidemia    Lung cancer ([place])    LVH (left ventricular hypertrophy)    on EKG   Mass of lower lobe of left lung    Mediastinal adenopathy    Smoker    former   Wears dentures    Wears dentures    Wears glasses    Wears glasses       Past Surgical History:  Procedure Laterality Date   BRONCHIAL BIOPSY  08/20/2019   Procedure: BRONCHIAL BIOPSIES;  Surgeon: Collene Gobble, MD;  Location: [facility] ENDOSCOPY;  Service: Pulmonary;;   BRONCHIAL BRUSHINGS  08/20/2019   Procedure: BRONCHIAL BRUSHINGS;  Surgeon: Collene Gobble, MD;  Location: [facility] ENDOSCOPY;  Service:  Pulmonary;;   BRONCHIAL NEEDLE ASPIRATION BIOPSY  08/20/2019   Procedure: BRONCHIAL NEEDLE ASPIRATION BIOPSIES;  Surgeon: Collene Gobble, MD;  Location: [facility] ENDOSCOPY;  Service: Pulmonary;;   CARPAL TUNNEL RELEASE Right 01/09/2019   Procedure: CARPAL TUNNEL RELEASE;  Surgeon: Leandrew Koyanagi, MD;  Location: [place];  Service: Orthopedics;  Laterality: Right;   CARPAL TUNNEL WITH CUBITAL TUNNEL Right 11/18/2020   Procedure: CARPAL TUNNEL WITH CUBITAL TUNNEL;  Surgeon: Charlotte Crumb, MD;  Location: [place];  Service: Orthopedics;  Laterality: Right;   CATARACT EXTRACTION Right 2018   COLONOSCOPY  2007   Dr. Benson [place]   DIRECT LARYNGOSCOPY N/A 04/10/2020   Procedure: DIRECT LARYNGOSCOPY;  Surgeon: Melida Quitter, MD;  Location: [place];  Service: ENT;  Laterality: N/A;   I & D EXTREMITY Right 04/02/2016   Procedure: IRRIGATION AND DEBRIDEMENT GREAT TOE;  Surgeon: Leandrew Koyanagi, MD;  Location: [place];  Service: Orthopedics;  Laterality: Right;   IR IMAGING GUIDED PORT INSERTION  08/30/2019   MULTIPLE TOOTH EXTRACTIONS     RADIOACTIVE SEED IMPLANT  2003   TRACHEOSTOMY TUBE PLACEMENT N/A 04/10/2020   Procedure: TRACHEOSTOMY;  Surgeon: Melida Quitter, MD;  Location: [place];  Service: ENT;  Laterality: N/A;   ULNAR TUNNEL RELEASE Right 01/09/2019   Procedure: RIGHT CUBITAL TUNNEL RELEASE AND CARPAL  TUNNEL RELEASE;  Surgeon: Leandrew Koyanagi, MD;  Location: [place];  Service: Orthopedics;  Laterality: Right;   UPPER GASTROINTESTINAL ENDOSCOPY     VIDEO BRONCHOSCOPY N/A 12/18/2019   Procedure: VIDEO BRONCHOSCOPY WITHOUT FLUORO;  Surgeon: Collene Gobble, MD;  Location: [facility] ENDOSCOPY;  Service: Cardiopulmonary;  Laterality: N/A;   VIDEO BRONCHOSCOPY WITH ENDOBRONCHIAL ULTRASOUND N/A 08/20/2019   Procedure: VIDEO BRONCHOSCOPY WITH ENDOBRONCHIAL ULTRASOUND;  Surgeon: Collene Gobble, MD;  Location: [facility] ENDOSCOPY;  Service: Pulmonary;  Laterality: N/A;    No Known Allergies  Current  Outpatient Medications  Medication Sig Dispense Refill   Alcohol Swabs (DROPSAFE ALCOHOL PREP) 70 % PADS Apply topically.     amLODipine (NORVASC) 5 MG tablet Take 1 tablet (5 mg total) by mouth daily. 90 tablet 3   aspirin EC 81 MG tablet Take 81 mg by mouth daily after breakfast.      bisacodyl (DULCOLAX) 5 MG EC tablet Take 10 mg by mouth daily as needed for moderate constipation.     Blood Glucose Calibration (TRUE METRIX LEVEL 1) Low SOLN      cholecalciferol (VITAMIN D) 25 MCG (1000 UNIT) tablet Take 1,000 Units by mouth daily.     dronabinol (MARINOL) 2.5 MG capsule Take 1 capsule (2.5 mg total) by mouth 2 (two) times daily before a meal. 60 capsule 1   ferrous sulfate 325 (65 FE) MG EC tablet TAKE 1 TABLET (325 MG TOTAL) DAILY WITH BREAKFAST. 90 tablet 1   Lancet Devices (TRUEDRAW LANCING DEVICE) MISC Check sugars daily 90 each 0   lisinopril-hydrochlorothiazide (ZESTORETIC) 20-12.5 MG tablet Take 1 tablet by mouth daily.     metFORMIN (GLUCOPHAGE-XR) 500 MG 24 hr tablet TAKE 1 TABLET EVERY DAY 90 tablet 1   Multiple Vitamin (MULTIVITAMIN WITH MINERALS) TABS tablet Take 1 tablet by mouth daily after breakfast.      simvastatin (ZOCOR) 40 MG tablet Take 40 mg by mouth daily.     solifenacin (VESICARE) 5 MG tablet TAKE 1 TABLET EVERY DAY 90 tablet 1   TRUE METRIX BLOOD GLUCOSE TEST test strip SMARTSIG:Via Meter     TRUEplus Lancets 33G MISC 1 each by Does not apply route 2 (two) times daily. 200 each 4   No current facility-administered medications for this visit.   Facility-Administered Medications Ordered in Other Visits  Medication Dose Route Frequency Provider Last Rate Last Admin   sodium chloride flush (NS) 0.9 % injection 10 mL  10 mL Intracatheter PRN Curt Bears, MD   10 mL at 09/04/19 1556    Family History Family History  Problem Relation Age of Onset   Heart failure Mother    Diabetes Mother    Stroke Father    Diabetes Father    Diabetes Sister    Diabetes  Sister    Lung cancer Sister 62   Colon cancer Neg Hx    Esophageal cancer Neg Hx    Rectal cancer Neg Hx    Colon polyps Neg Hx    Stomach cancer Neg Hx       Social History Social History   Tobacco Use   Smoking status: Former    Packs/day: 1.00    Years: 32.00    Pack years: 32.00    Types: Cigarettes    Start date: 02/08/1955    Quit date: 10/08/1985    Years since quitting: 35.3   Smokeless tobacco: Never  Vaping Use   Vaping Use: Never used  Substance Use Topics  Alcohol use: No   Drug use: No       Review of Systems  Constitutional: Negative.   HENT:  Positive for hearing loss.   Eyes: Negative.   Respiratory: Negative.    Cardiovascular: Negative.   Gastrointestinal:  Positive for constipation.  Genitourinary:  Positive for frequency and urgency.  Skin: Negative.   Neurological: Negative.   Psychiatric/Behavioral: Negative.       Physical Exam Blood pressure 138/75, pulse 69, temperature 99.3 F (37.4 C), temperature source Oral, height 5\' 7"  (1.702 m), weight 185 lb 3.2 oz (84 kg), SpO2 99 %. Last Weight  Most recent update: 02/09/2021  9:25 AM    Weight  84 kg (185 lb 3.2 oz)             CONSTITUTIONAL: Well developed, and nourished, appropriately responsive and aware without distress.   EYES: Sclera non-icteric.   EARS, NOSE, MOUTH AND THROAT: Mask worn.  NECK: Trachea is midline with tracheostomy in place, and there is no jugular venous distension.  LYMPH NODES:  Lymph nodes in the neck are not appreciated. RESPIRATORY:  Breath sounds are equal bilaterally. Normal respiratory effort without pathologic use of accessory muscles. CARDIOVASCULAR: Heart is regular in rate and rhythm. GI: The abdomen is soft, nontender, and nondistended. There were no palpable masses. I did not appreciate hepatosplenomegaly. There were normal bowel sounds. GU: Readily prominent large right inguinal hernia, readily reducible in supine position.  Radiographic no left  inguinal hernia not evaluated, currently not symptomatic. MUSCULOSKELETAL:  Symmetrical muscle tone appreciated in all four extremities.    SKIN: Skin turgor is normal. No pathologic skin lesions appreciated.  NEUROLOGIC:  Motor and sensation appear grossly normal.  Cranial nerves are grossly without defect. PSYCH:  Alert and oriented to person, place and time. Affect is appropriate for situation.  Data Reviewed I have personally reviewed what is currently available of the patient's imaging, recent labs and medical records.   Labs:  CBC Latest Ref Rng & Units 02/04/2021 01/13/2021 12/23/2020  WBC 4.0 - 10.5 K/uL 5.3 4.9 5.3  Hemoglobin 13.0 - 17.0 g/dL 10.6(L) 10.4(L) 11.0(L)  Hematocrit 39.0 - 52.0 % 31.6(L) 30.6(L) 32.5(L)  Platelets 150 - 400 K/uL 275 268 249   CMP Latest Ref Rng & Units 02/04/2021 01/13/2021 12/23/2020  Glucose 70 - 99 mg/dL 88 113(H) 108(H)  BUN 8 - 23 mg/dL 19 23 17   Creatinine 0.61 - 1.24 mg/dL 1.34(H) 1.30(H) 1.29(H)  Sodium 135 - 145 mmol/L 140 140 139  Potassium 3.5 - 5.1 mmol/L 3.9 3.9 4.0  Chloride 98 - 111 mmol/L 107 107 106  CO2 22 - 32 mmol/L 27 24 24   Calcium 8.9 - 10.3 mg/dL 9.1 8.8(L) 8.9  Total Protein 6.5 - 8.1 g/dL 7.9 7.6 7.8  Total Bilirubin 0.3 - 1.2 mg/dL 0.5 0.6 0.7  Alkaline Phos 38 - 126 U/L 52 62 61  AST 15 - 41 U/L 13(L) 12(L) 13(L)  ALT 0 - 44 U/L 10 9 10    For the left which is followed for 59-month   Imaging: Radiology review:   CT imaging from July and October reviewed.   Assessment    Large symptomatic right inguinal hernia. Patient Active Problem List   Diagnosis Date Noted   Mild cognitive impairment 02/04/2021   First degree heart block 02/04/2021   Right inguinal hernia 01/14/2021   Trachea, stenosis    Tracheostomy status ([place])    Left lower lobe pneumonia 04/20/2020   Pressure injury of  skin 04/20/2020   Pleuritic chest pain 03/17/2020   Chemotherapy-induced neuropathy ([place]) 02/12/2020   Vocal cord dysfunction  02/12/2020   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   Stridor 12/18/2019   Abnormal findings on diagnostic imaging of lung 11/06/2019   Antineoplastic chemotherapy induced anemia 10/16/2019   Shortness of breath 10/15/2019   Odynophagia 09/25/2019   Port-A-Cath in place 09/17/2019   Decreased appetite 09/03/2019   Malignant neoplasm of lower lobe of left lung ([place]) 08/28/2019   Encounter for antineoplastic chemotherapy 08/24/2019   Encounter for antineoplastic immunotherapy 08/24/2019   Goals of care, counseling/discussion 08/24/2019   Atherosclerosis of aorta ([place]) 04/09/2019   Carpal tunnel syndrome, left upper limb 02/19/2019   History of carpal tunnel surgery of right wrist 12/21/2018   OAB (overactive bladder) 10/11/2017   Diabetic nephropathy associated with type 2 diabetes mellitus ([place]) 10/11/2017   Onychomycosis of multiple toenails with type 2 diabetes mellitus ([place]) 07/22/2015   Former smoker, stopped smoking in distant past 07/22/2015   Diabetes mellitus type II, non insulin dependent ([place]) 06/13/2011   ED (erectile dysfunction) 06/13/2011   History of prostate cancer 06/13/2011   Hypertension associated with diabetes ([place]) 06/13/2011   Hyperlipidemia associated with type 2 diabetes mellitus ([place]) 06/13/2011   Allergic rhinitis, mild 06/13/2011   History of asthma 06/13/2011   Carotid stenosis 06/13/2011    Plan    Robotic right inguinal hernia repair.   Although a fat filled left inguinal hernia was noted on CT scan, he is not symptomatic on that side.  And considering his age I do not feel it is worth repairing unless there is an obvious hernia sac present. I discussed possibility of incarceration, strangulation, enlargement in size over time, and the need for emergency surgery in the face of these.  Also reviewed the techniques of reduction should incarceration occur, and when unsuccessful to present to the ED.  Also discussed that surgery risks include recurrence which can be  up to 30% in the case of complex hernias, use of prosthetic materials (mesh) and the increased risk of infection and the possible need for re-operation and removal of mesh, possibility of post-op SBO or ileus, and the risks of general anesthetic including heart attack, stroke, sudden death or some reaction to anesthetic medications. The patient, and those present, appear to understand the risks, any and all questions were answered to the patient's satisfaction.  No guarantees were ever expressed or implied.    These notes generated with voice recognition software. I apologize for typographical errors.  Ronny Bacon M.D., FACS 02/09/2021, 9:38 AM

## 2021-02-12 ENCOUNTER — Encounter
Admission: RE | Disposition: A | Discharge status: Home / Self Care | Payer: Self-pay | Source: Home / Self Care | Attending: Surgery

## 2021-02-12 ENCOUNTER — Other Ambulatory Visit: Payer: Self-pay

## 2021-02-12 ENCOUNTER — Ambulatory Visit: Payer: Medicare HMO | Admitting: Anesthesiology

## 2021-02-12 ENCOUNTER — Encounter: Payer: Self-pay | Admitting: Surgery

## 2021-02-12 ENCOUNTER — Ambulatory Visit
Admission: RE | Admit: 2021-02-12 | Discharge: 2021-02-12 | Disposition: A | Discharge status: Home / Self Care | Payer: Medicare HMO | Attending: Surgery | Admitting: Surgery

## 2021-02-12 DIAGNOSIS — E119 Type 2 diabetes mellitus without complications: Secondary | ICD-10-CM | POA: Diagnosis not present

## 2021-02-12 DIAGNOSIS — J45909 Unspecified asthma, uncomplicated: Secondary | ICD-10-CM | POA: Insufficient documentation

## 2021-02-12 DIAGNOSIS — I10 Essential (primary) hypertension: Secondary | ICD-10-CM | POA: Diagnosis not present

## 2021-02-12 DIAGNOSIS — Z87891 Personal history of nicotine dependence: Secondary | ICD-10-CM | POA: Diagnosis not present

## 2021-02-12 DIAGNOSIS — Z930 Tracheostomy status: Secondary | ICD-10-CM | POA: Insufficient documentation

## 2021-02-12 DIAGNOSIS — E785 Hyperlipidemia, unspecified: Secondary | ICD-10-CM | POA: Diagnosis not present

## 2021-02-12 DIAGNOSIS — K219 Gastro-esophageal reflux disease without esophagitis: Secondary | ICD-10-CM | POA: Diagnosis not present

## 2021-02-12 DIAGNOSIS — K4090 Unilateral inguinal hernia, without obstruction or gangrene, not specified as recurrent: Secondary | ICD-10-CM | POA: Diagnosis not present

## 2021-02-12 DIAGNOSIS — C3492 Malignant neoplasm of unspecified part of left bronchus or lung: Secondary | ICD-10-CM | POA: Insufficient documentation

## 2021-02-12 LAB — GLUCOSE, CAPILLARY
Glucose-Capillary: 91 mg/dL (ref 70–99)
Glucose-Capillary: 104 mg/dL — ABNORMAL HIGH (ref 70–99)

## 2021-02-12 SURGERY — HERNIORRHAPHY, INGUINAL, ROBOT-ASSISTED, LAPAROSCOPIC
Anesthesia: General | Laterality: Right

## 2021-02-12 MED ORDER — ONDANSETRON HCL 4 MG/2ML IJ SOLN: 4.0000 mg | Freq: Once | INTRAMUSCULAR | Status: DC | PRN

## 2021-02-12 MED ORDER — ACETAMINOPHEN 500 MG PO TABS: 1000.0000 mg | ORAL_TABLET | ORAL | Status: AC

## 2021-02-12 MED ORDER — BUPIVACAINE LIPOSOME 1.3 % IJ SUSP: 20.0000 mL | Freq: Once | INTRAMUSCULAR | Status: DC

## 2021-02-12 MED ORDER — PHENYLEPHRINE HCL-NACL 20-0.9 MG/250ML-% IV SOLN
INTRAVENOUS | Status: DC | PRN
  Administered 2021-02-12: 25 ug/min via INTRAVENOUS

## 2021-02-12 MED ORDER — CELECOXIB 200 MG PO CAPS
ORAL_CAPSULE | ORAL | Status: AC
  Administered 2021-02-12: 200 mg via ORAL
  Filled 2021-02-12: qty 1

## 2021-02-12 MED ORDER — FENTANYL CITRATE (PF) 100 MCG/2ML IJ SOLN
INTRAMUSCULAR | Status: DC | PRN
  Administered 2021-02-12: 25 ug via INTRAVENOUS
  Administered 2021-02-12: 50 ug via INTRAVENOUS
  Administered 2021-02-12: 25 ug via INTRAVENOUS

## 2021-02-12 MED ORDER — ACETAMINOPHEN 500 MG PO TABS
ORAL_TABLET | ORAL | Status: AC
  Administered 2021-02-12: 1000 mg via ORAL
  Filled 2021-02-12: qty 2

## 2021-02-12 MED ORDER — CELECOXIB 200 MG PO CAPS: 200.0000 mg | ORAL_CAPSULE | ORAL | Status: AC

## 2021-02-12 MED ORDER — CHLORHEXIDINE GLUCONATE 0.12 % MT SOLN: 15.0000 mL | Freq: Once | OROMUCOSAL | Status: AC

## 2021-02-12 MED ORDER — BUPIVACAINE-EPINEPHRINE (PF) 0.25% -1:200000 IJ SOLN
INTRAMUSCULAR | Status: AC
  Filled 2021-02-12: qty 30

## 2021-02-12 MED ORDER — ONDANSETRON HCL 4 MG/2ML IJ SOLN
INTRAMUSCULAR | Status: AC
  Filled 2021-02-12: qty 2

## 2021-02-12 MED ORDER — ROCURONIUM BROMIDE 10 MG/ML (PF) SYRINGE
PREFILLED_SYRINGE | INTRAVENOUS | Status: AC
  Filled 2021-02-12: qty 10

## 2021-02-12 MED ORDER — DEXAMETHASONE SODIUM PHOSPHATE 10 MG/ML IJ SOLN
INTRAMUSCULAR | Status: AC
  Filled 2021-02-12: qty 1

## 2021-02-12 MED ORDER — FENTANYL CITRATE (PF) 100 MCG/2ML IJ SOLN: 25.0000 ug | INTRAMUSCULAR | Status: DC | PRN

## 2021-02-12 MED ORDER — GABAPENTIN 300 MG PO CAPS: 300.0000 mg | ORAL_CAPSULE | ORAL | Status: AC

## 2021-02-12 MED ORDER — CEFAZOLIN SODIUM-DEXTROSE 2-4 GM/100ML-% IV SOLN
INTRAVENOUS | Status: AC
  Filled 2021-02-12: qty 100

## 2021-02-12 MED ORDER — CEFAZOLIN SODIUM-DEXTROSE 2-4 GM/100ML-% IV SOLN
2.0000 g | INTRAVENOUS | Status: AC
  Administered 2021-02-12: 2 g via INTRAVENOUS

## 2021-02-12 MED ORDER — KETAMINE HCL 10 MG/ML IJ SOLN
INTRAMUSCULAR | Status: DC | PRN
  Administered 2021-02-12: 30 mg via INTRAVENOUS

## 2021-02-12 MED ORDER — ORAL CARE MOUTH RINSE: 15.0000 mL | Freq: Once | OROMUCOSAL | Status: AC

## 2021-02-12 MED ORDER — SUCCINYLCHOLINE CHLORIDE 200 MG/10ML IV SOSY: PREFILLED_SYRINGE | INTRAVENOUS | Status: DC | PRN

## 2021-02-12 MED ORDER — FENTANYL CITRATE (PF) 100 MCG/2ML IJ SOLN
INTRAMUSCULAR | Status: AC
  Filled 2021-02-12: qty 2

## 2021-02-12 MED ORDER — SUGAMMADEX SODIUM 500 MG/5ML IV SOLN
INTRAVENOUS | Status: AC
  Filled 2021-02-12: qty 5

## 2021-02-12 MED ORDER — KETAMINE HCL 50 MG/5ML IJ SOSY
PREFILLED_SYRINGE | INTRAMUSCULAR | Status: AC
  Filled 2021-02-12: qty 5

## 2021-02-12 MED ORDER — BUPIVACAINE LIPOSOME 1.3 % IJ SUSP
INTRAMUSCULAR | Status: AC
  Filled 2021-02-12: qty 20

## 2021-02-12 MED ORDER — MEPERIDINE HCL 25 MG/ML IJ SOLN: 6.2500 mg | INTRAMUSCULAR | Status: DC | PRN

## 2021-02-12 MED ORDER — FAMOTIDINE 20 MG PO TABS: 20.0000 mg | ORAL_TABLET | Freq: Once | ORAL | Status: DC

## 2021-02-12 MED ORDER — BUPIVACAINE-EPINEPHRINE (PF) 0.25% -1:200000 IJ SOLN
INTRAMUSCULAR | Status: DC | PRN
  Administered 2021-02-12: 30 mL

## 2021-02-12 MED ORDER — ONDANSETRON HCL 4 MG/2ML IJ SOLN
INTRAMUSCULAR | Status: DC | PRN
  Administered 2021-02-12: 4 mg via INTRAVENOUS

## 2021-02-12 MED ORDER — CHLORHEXIDINE GLUCONATE 0.12 % MT SOLN
OROMUCOSAL | Status: AC
  Administered 2021-02-12: 15 mL via OROMUCOSAL
  Filled 2021-02-12: qty 15

## 2021-02-12 MED ORDER — EPHEDRINE 5 MG/ML INJ
INTRAVENOUS | Status: AC
  Filled 2021-02-12: qty 5

## 2021-02-12 MED ORDER — LORAZEPAM 2 MG/ML IJ SOLN: 1.0000 mg | Freq: Once | INTRAMUSCULAR | Status: DC | PRN

## 2021-02-12 MED ORDER — CHLORHEXIDINE GLUCONATE CLOTH 2 % EX PADS: 6.0000 | MEDICATED_PAD | Freq: Once | CUTANEOUS | Status: DC

## 2021-02-12 MED ORDER — EPHEDRINE SULFATE 50 MG/ML IJ SOLN
INTRAMUSCULAR | Status: DC | PRN
  Administered 2021-02-12: 10 mg via INTRAVENOUS
  Administered 2021-02-12: 5 mg via INTRAVENOUS

## 2021-02-12 MED ORDER — HYDROCODONE-ACETAMINOPHEN 5-325 MG PO TABS: 1.0000 | ORAL_TABLET | Freq: Four times a day (QID) | ORAL | 0 refills | Status: DC | PRN

## 2021-02-12 MED ORDER — ROCURONIUM BROMIDE 100 MG/10ML IV SOLN
INTRAVENOUS | Status: DC | PRN
  Administered 2021-02-12: 50 mg via INTRAVENOUS
  Administered 2021-02-12: 20 mg via INTRAVENOUS

## 2021-02-12 MED ORDER — LIDOCAINE HCL (CARDIAC) PF 100 MG/5ML IV SOSY
PREFILLED_SYRINGE | INTRAVENOUS | Status: DC | PRN
  Administered 2021-02-12: 60 mg via INTRAVENOUS

## 2021-02-12 MED ORDER — DEXAMETHASONE SODIUM PHOSPHATE 10 MG/ML IJ SOLN
INTRAMUSCULAR | Status: DC | PRN
  Administered 2021-02-12: 5 mg via INTRAVENOUS

## 2021-02-12 MED ORDER — PROPOFOL 10 MG/ML IV BOLUS
INTRAVENOUS | Status: DC | PRN
  Administered 2021-02-12 (×2): 20 mg via INTRAVENOUS

## 2021-02-12 MED ORDER — GABAPENTIN 300 MG PO CAPS
ORAL_CAPSULE | ORAL | Status: AC
  Administered 2021-02-12: 300 mg via ORAL
  Filled 2021-02-12: qty 1

## 2021-02-12 MED ORDER — IBUPROFEN 800 MG PO TABS: 800.0000 mg | ORAL_TABLET | Freq: Three times a day (TID) | ORAL | 0 refills | Status: DC | PRN

## 2021-02-12 MED ORDER — PHENYLEPHRINE HCL (PRESSORS) 10 MG/ML IV SOLN
INTRAVENOUS | Status: DC | PRN
  Administered 2021-02-12 (×3): 160 ug via INTRAVENOUS
  Administered 2021-02-12: 80 ug via INTRAVENOUS

## 2021-02-12 MED ORDER — APREPITANT 40 MG PO CAPS
40.0000 mg | ORAL_CAPSULE | Freq: Once | ORAL | Status: AC
  Administered 2021-02-12: 40 mg via ORAL

## 2021-02-12 MED ORDER — APREPITANT 40 MG PO CAPS
ORAL_CAPSULE | ORAL | Status: AC
  Filled 2021-02-12: qty 1

## 2021-02-12 MED ORDER — SODIUM CHLORIDE 0.9 % IV SOLN: INTRAVENOUS | Status: DC

## 2021-02-12 MED ORDER — LIDOCAINE HCL (PF) 2 % IJ SOLN
INTRAMUSCULAR | Status: AC
  Filled 2021-02-12: qty 5

## 2021-02-12 MED ORDER — SUGAMMADEX SODIUM 200 MG/2ML IV SOLN
INTRAVENOUS | Status: DC | PRN
  Administered 2021-02-12: 200 mg via INTRAVENOUS

## 2021-02-12 SURGICAL SUPPLY — 50 items
ADH SKN CLS APL DERMABOND .7 (GAUZE/BANDAGES/DRESSINGS) ×1
APL PRP STRL LF DISP 70% ISPRP (MISCELLANEOUS) ×1
BLADE CLIPPER SURG (BLADE) ×2 IMPLANT
CANNULA CAP OBTURATR AIRSEAL 8 (CAP) ×3 IMPLANT
CHLORAPREP W/TINT 26 (MISCELLANEOUS) ×3 IMPLANT
COVER TIP SHEARS 8 DVNC (MISCELLANEOUS) ×2 IMPLANT
COVER TIP SHEARS 8MM DA VINCI (MISCELLANEOUS) ×2
COVER WAND RF STERILE (DRAPES) ×3 IMPLANT
DEFOGGER SCOPE WARMER CLEARIFY (MISCELLANEOUS) ×3 IMPLANT
DERMABOND ADVANCED (GAUZE/BANDAGES/DRESSINGS) ×1
DERMABOND ADVANCED .7 DNX12 (GAUZE/BANDAGES/DRESSINGS) ×2 IMPLANT
DRAPE 3/4 80X56 (DRAPES) ×3 IMPLANT
DRAPE ARM DVNC X/XI (DISPOSABLE) ×6 IMPLANT
DRAPE COLUMN DVNC XI (DISPOSABLE) ×2 IMPLANT
DRAPE DA VINCI XI ARM (DISPOSABLE) ×6
DRAPE DA VINCI XI COLUMN (DISPOSABLE) ×2
ELECT REM PT RETURN 9FT ADLT (ELECTROSURGICAL) ×2
ELECTRODE REM PT RTRN 9FT ADLT (ELECTROSURGICAL) ×2 IMPLANT
GAUZE 4X4 16PLY ~~LOC~~+RFID DBL (SPONGE) ×3 IMPLANT
GLOVE SURG ORTHO LTX SZ7.5 (GLOVE) ×9 IMPLANT
GOWN STRL REUS W/ TWL LRG LVL3 (GOWN DISPOSABLE) ×6 IMPLANT
GOWN STRL REUS W/TWL LRG LVL3 (GOWN DISPOSABLE) ×6
GRASPER SUT TROCAR 14GX15 (MISCELLANEOUS) IMPLANT
IRRIGATION STRYKERFLOW (MISCELLANEOUS) IMPLANT
IRRIGATOR STRYKERFLOW (MISCELLANEOUS)
IV CATH ANGIO 14GX1.88 NO SAFE (IV SOLUTION) ×3 IMPLANT
IV NS 1000ML (IV SOLUTION)
IV NS 1000ML BAXH (IV SOLUTION) IMPLANT
KIT PINK PAD W/HEAD ARE REST (MISCELLANEOUS) ×2
KIT PINK PAD W/HEAD ARM REST (MISCELLANEOUS) ×2 IMPLANT
LABEL OR SOLS (LABEL) ×3 IMPLANT
MANIFOLD NEPTUNE II (INSTRUMENTS) ×3 IMPLANT
MESH 3DMAX LIGHT 4.8X6.7 RT XL (Mesh General) ×1 IMPLANT
NDL INSUFFLATION 14GA 120MM (NEEDLE) IMPLANT
NEEDLE HYPO 22GX1.5 SAFETY (NEEDLE) ×3 IMPLANT
NEEDLE INSUFFLATION 14GA 120MM (NEEDLE) IMPLANT
PACK LAP CHOLECYSTECTOMY (MISCELLANEOUS) ×3 IMPLANT
SEAL CANN UNIV 5-8 DVNC XI (MISCELLANEOUS) ×4 IMPLANT
SEAL XI 5MM-8MM UNIVERSAL (MISCELLANEOUS) ×4
SET TUBE FILTERED XL AIRSEAL (SET/KITS/TRAYS/PACK) ×3 IMPLANT
SOLUTION ELECTROLUBE (MISCELLANEOUS) ×3 IMPLANT
SUPPORT SCROTAL LRG NO STRP (SOFTGOODS) ×1 IMPLANT
SUT MNCRL 4-0 (SUTURE) ×2
SUT MNCRL 4-0 27XMFL (SUTURE) ×1
SUT VIC AB 0 CT2 27 (SUTURE) ×3 IMPLANT
SUT VLOC 90 2/L VL 12 GS22 (SUTURE) IMPLANT
SUT VLOC 90 S/L VL9 GS22 (SUTURE) ×3 IMPLANT
SUTURE MNCRL 4-0 27XMF (SUTURE) ×2 IMPLANT
TROCAR Z-THREAD FIOS 11X100 BL (TROCAR) IMPLANT
WATER STERILE IRR 500ML POUR (IV SOLUTION) ×3 IMPLANT

## 2021-02-12 NOTE — Interval H&P Note (Signed)
History and Physical Interval Note:  02/12/2021 12:22 PM  Shaun Kennedy  has presented today for surgery, with the diagnosis of right inguinal hernia.  The various methods of treatment have been discussed with the patient and family. After consideration of risks, benefits and other options for treatment, the patient has consented to  Procedure(s): XI ROBOTIC [place] (Right) as a surgical intervention.  The patient's history has been reviewed, patient examined, no change in status, stable for surgery.  I have reviewed the patient's chart and labs.  Questions were answered to the patient's satisfaction.    The right groin is marked.   Ronny Bacon

## 2021-02-12 NOTE — OR Nursing (Signed)
D/C pending void.

## 2021-02-12 NOTE — Anesthesia Preprocedure Evaluation (Addendum)
Anesthesia Evaluation    Airway Mallampati: II  TM Distance: >3 FB Neck ROM: Full    Dental no notable dental hx. (+) Upper Dentures, Partial Lower   Pulmonary asthma , pneumonia, former smoker,  H/O LUNG CA LLL,  C TRACH TUBE   Pulmonary exam normal breath sounds clear to auscultation       Cardiovascular hypertension, Normal cardiovascular exam+ dysrhythmias  Rhythm:Regular Rate:Normal  EKG  NSR  1 DEG AVB  LAD  HLD   Neuro/Psych    GI/Hepatic GERD  ,  Endo/Other  diabetes  Renal/GU CRFRenal disease (GFR 51)     Musculoskeletal   Abdominal   Peds  Hematology   Anesthesia Other Findings   Reproductive/Obstetrics                           Anesthesia Physical Anesthesia Plan  ASA: 3  Anesthesia Plan: General ETT   Post-op Pain Management:    Induction: Intravenous  PONV Risk Score and Plan:   Airway Management Planned: Oral ETT  Additional Equipment:   Intra-op Plan:   Post-operative Plan: Extubation in OR  Informed Consent: I have reviewed the patients History and Physical, chart, labs and discussed the procedure including the risks, benefits and alternatives for the proposed anesthesia with the patient or authorized representative who has indicated his/her understanding and acceptance.     Dental Advisory Given  Plan Discussed with: Anesthesiologist, CRNA and Surgeon  Anesthesia Plan Comments: (Patient consented for risks of anesthesia including but not limited to:  - adverse reactions to medications - damage to eyes, teeth, lips or other oral mucosa - nerve damage due to positioning  - sore throat or hoarseness - Damage to heart, brain, nerves, lungs, other parts of body or loss of life  Patient voiced understanding.)        Anesthesia Quick Evaluation

## 2021-02-12 NOTE — Anesthesia Procedure Notes (Signed)
Procedure Name: Intubation Date/Time: 02/12/2021 12:42 PM Performed by: Lia Foyer, CRNA Pre-anesthesia Checklist: Patient identified, Emergency Drugs available, Suction available and Patient being monitored Patient Re-evaluated:Patient Re-evaluated prior to induction Oxygen Delivery Method: Circle system utilized Preoxygenation: Pre-oxygenation with 100% oxygen Induction Type: Tracheostomy and Combination inhalational/ intravenous induction Number of attempts: 1 Airway Equipment and Method: Tracheostomy Placement Confirmation: positive ETCO2 and breath sounds checked- equal and bilateral Tube secured with: Tape Dental Injury: Teeth and Oropharynx as per pre-operative assessment  Future Recommendations: Recommend- induction with short-acting agent, and alternative techniques readily available Comments: Tracheostomy in place, replaced with cuffed [place] size #6 tube

## 2021-02-12 NOTE — Op Note (Signed)
Robotic assisted Laparoscopic Transabdominal Right Inguinal Hernia Repair with Mesh       Pre-operative Diagnosis:  Right Inguinal Hernia   Post-operative Diagnosis: Same   Procedure: Robotic assisted Laparoscopic  repair of Right  inguinal hernia(s)   Surgeon: Ronny Bacon, M.D., FACS   Anesthesia: GETA   Findings: large 4 cm defect right direct inguinal hernia,   evidence of smaller left sided hernia.       Right: Left:   Procedure Details  The patient was seen again in the Holding Room. The benefits, complications, treatment options, and expected outcomes were discussed with the patient. The risks of bleeding, infection, recurrence of symptoms, failure to resolve symptoms, recurrence of hernia, ischemic orchitis, chronic pain syndrome or neuroma, were reviewed again. The likelihood of improving the patient's symptoms with return to their baseline status is good.  The patient and/or family concurred with the proposed plan, giving informed consent.  The patient was taken to Operating Room, identified  and the procedure verified as Laparoscopic Inguinal Hernia Repair. Laterality confirmed.  A Time Out was held and the above information confirmed.   Prior to the induction of general anesthesia, antibiotic prophylaxis was administered. VTE prophylaxis was in place. General endotracheal anesthesia was then administered and tolerated well. After the induction, the abdomen was prepped with Chloraprep and draped in the sterile fashion. The patient was positioned in the supine position.   After local infiltration of quarter percent Marcaine with epinephrine, stab incision was made left upper quadrant.  On the left at Palmer's point, the Veress needle is passed with sensation of the layers to penetrate the abdominal wall and into the peritoneum.  Saline drop test is confirmed peritoneal placement.  Insufflation is initiated with carbon dioxide to pressures of 15 mmHg. An 8.5 mm port is placed to  the left off of the midline, with blunt tipped trocar.  Pneumoperitoneum maintained w/o HD changes using the AirSeal to pressures of 15 mm Hg with CO2. No evidence of bowel injuries.  Two 8.5 mm ports placed under direct vision in each upper quadrant. The laparoscopy revealed large right direct defect(s), which was symptomatic and for which he presented for repair.   The robot was brought ot the table and docked in the standard fashion, no collision between arms was observed. Instruments were kept under direct view at all times. For right inguinal hernia repair,  I developed a peritoneal flap. The sac(s) were reduced and dissected free from adjacent structures. We preserved the vas and the vessels, and visualized them to their convergence and beyond in the retroperitoneum.   Once dissection was completed an extra-large right sided BARD 3D Light mesh was placed and secured at three points with interrupted 0 Vicryl to the pubic tubercle and anteriorly. There was good coverage of the direct, indirect and femoral spaces.   Second look revealed no complications or injuries.  The flap was then closed with 2-0 V-lock suture.  Peritoneal closure without defects.    Once assuring that hemostasis was adequate, all needles/sponges removed, and the robot was undocked.  A large angiocath is placed under direct visualization in the groin to reduce trapped extraperitoneal air and confirm adequate peritoneal closure.     The ports were removed, the abdomen desulflated.  4-0 subcuticular Monocryl was used at all skin edges. Dermabond was placed.  Patient tolerated the procedure well. There were no complications. He was taken to the recovery room in stable condition.  Ronny Bacon, M.D., FACS 02/12/2021, 2:04 PM

## 2021-02-12 NOTE — OR Nursing (Signed)
RT in postop for trach cleaning 1615.

## 2021-02-12 NOTE — Transfer of Care (Signed)
Immediate Anesthesia Transfer of Care Note  Patient: Shaun Kennedy  Procedure(s) Performed: XI ROBOTIC ASSISTED INGUINAL HERNIA (Right)  Patient Location: PACU  Anesthesia Type:General  Level of Consciousness: drowsy  Airway & Oxygen Therapy: Patient Spontanous Breathing and Patient connected to face mask oxygen  Post-op Assessment: Report given to RN and Post -op Vital signs reviewed and stable  Post vital signs: Reviewed and stable  Last Vitals:  Vitals Value Taken Time  BP 137/64 02/12/21 1410  Temp    Pulse 64 02/12/21 1413  Resp 14 02/12/21 1413  SpO2 100 % 02/12/21 1413  Vitals shown include unvalidated device data.  Last Pain:  Vitals:   02/12/21 1038  TempSrc: Temporal  PainSc: 1          Complications: No notable events documented.

## 2021-02-12 NOTE — Discharge Instructions (Signed)

## 2021-02-13 NOTE — Anesthesia Postprocedure Evaluation (Signed)
Anesthesia Post Note  Patient: Shaun Kennedy  Procedure(s) Performed: XI ROBOTIC ASSISTED INGUINAL HERNIA (Right)  Anesthesia Type: General Anesthetic complications: no   No notable events documented.   Last Vitals:  Vitals:   02/12/21 1639 02/12/21 1655  BP: 136/69 (!) 157/79  Pulse: 74 68  Resp: 18 16  Temp:  (!) 36.2 C  SpO2: 100% 100%    Last Pain:  Vitals:   02/12/21 1655  TempSrc: Temporal  PainSc: [place]

## 2021-02-17 ENCOUNTER — Other Ambulatory Visit: Payer: Self-pay

## 2021-02-17 NOTE — Patient Outreach (Signed)
[facility]) Care Management  02/17/2021  Shaun Kennedy May 26, 1940 [id number]   Telephone Assessment Annual Assessment   Successful outreach call. Spoke with spouse who reports that patient is doing fairly well he is resting at present. He had abd hernia surgery repair last week and is recovering.Spouse continues to assist with patient's care needs as needed. Appetite remains good. Wgt stable. Denies any recent falls. No RN CM needs or concerns voiced this call.   Medications Reviewed Today     Reviewed by [name], RN (Registered Nurse) on 02/17/21 at [phone]  Med List Status: <None>   Medication Order Taking? Sig Documenting Provider Last Dose Status Informant  Alcohol Swabs (DROPSAFE ALCOHOL PREP) 70 % PADS [id number] No Apply topically. [provider] 02/12/2021 Active Spouse/Significant Other  amLODipine (NORVASC) 5 MG tablet [id number] No Take 1 tablet (5 mg total) by mouth daily. [name], MD 02/11/2021 Active Spouse/Significant Other  aspirin EC 81 MG tablet [id number] No Take 81 mg by mouth daily after breakfast.  [provider] 02/11/2021 Active Spouse/Significant Other  bisacodyl (DULCOLAX) 5 MG EC tablet [id number] No Take 10 mg by mouth daily as needed for moderate constipation. [provider] 02/11/2021 Active Spouse/Significant Other  Blood Glucose Calibration (TRUE METRIX LEVEL 1) Low SOLN [id number] No  [provider] 02/12/2021 Active Spouse/Significant Other  cholecalciferol (VITAMIN D) 25 MCG (1000 UNIT) tablet [id number] No Take 1,000 Units by mouth daily. [provider] 02/11/2021 Active Spouse/Significant Other  dronabinol (MARINOL) 2.5 MG capsule [id number] No Take 1 capsule (2.5 mg total) by mouth 2 (two) times daily before a meal. [name], MD Taking Active Spouse/Significant Other  ferrous sulfate 325 (65 FE) MG EC tablet [id number] No TAKE 1 TABLET (325 MG TOTAL) DAILY WITH BREAKFAST.  [name], MD 02/11/2021 Active Spouse/Significant Other  HYDROcodone-acetaminophen (NORCO/VICODIN) 5-325 MG tablet [id number]  Take 1 tablet by mouth every 6 (six) hours as needed for moderate pain. Ronny Bacon, MD  Active   ibuprofen (ADVIL) 800 MG tablet [id number]  Take 1 tablet (800 mg total) by mouth every 8 (eight) hours as needed. Ronny Bacon, MD  Active   Lancet Devices (TRUEDRAW LANCING DEVICE) [place] [id number] No Check sugars daily [name], MD 02/12/2021 Active Spouse/Significant Other  lisinopril-hydrochlorothiazide (ZESTORETIC) 20-12.5 MG tablet [id number] No Take 1 tablet by mouth daily. [provider] 02/11/2021 Active Spouse/Significant Other  metFORMIN (GLUCOPHAGE-XR) 500 MG 24 hr tablet [id number] No TAKE 1 TABLET EVERY DAY [name], MD 02/10/2021 Active Spouse/Significant Other  Multiple Vitamin (MULTIVITAMIN WITH MINERALS) TABS tablet [id number] No Take 1 tablet by mouth daily after breakfast.  [provider] 02/11/2021 Active Spouse/Significant Other  simvastatin (ZOCOR) 40 MG tablet [id number] No Take 40 mg by mouth daily. [provider] 02/11/2021 Active Spouse/Significant Other  solifenacin (VESICARE) 5 MG tablet [id number] No TAKE 1 TABLET EVERY DAY [name], MD 02/11/2021 Active Spouse/Significant Other  TRUE METRIX BLOOD GLUCOSE TEST test strip [id number] No SMARTSIG:Via Meter [provider] 02/12/2021 Active Spouse/Significant Other  TRUEplus Lancets 33G MISC [id number] No 1 each by Does not apply route 2 (two) times daily. [name], MD 02/12/2021 Active Spouse/Significant Other           Fall Risk 01/14/2021 02/04/2021 02/09/2021 02/12/2021 02/17/2021  Falls in the past year? 0 - 0 - 0  Was there an injury with Fall? - - - - 0  Fall Risk Category Calculator - - - - 0  Fall Risk Category - - - - Low  Patient Fall Risk Level - Low fall risk - High fall risk Low fall risk  Patient at Risk for Falls Due to - - - -  Medication side effect  Fall risk Follow up - - - - Falls evaluation completed    Depression screen [facility] 2/9 02/17/2021 04/27/2020 10/28/2019  Decreased Interest 0 0 0  Down, Depressed, Hopeless 0 0 0  PHQ - 2 Score 0 0 0  Some recent data might be hidden    SDOH Screenings   Alcohol Screen: Not on file  Depression (PHQ2-9): Low Risk    PHQ-2 Score: 0  Financial Resource Strain: Not on file  Food Insecurity: No Food Insecurity   Worried About Charity fundraiser in the Last Year: Never true   Ran Out of Food in the Last Year: Never true  Housing: Not on file  Physical Activity: Not on file  Social Connections: Not on file  Stress: Not on file  Tobacco Use: Medium Risk   Smoking Tobacco Use: Former   Smokeless Tobacco Use: Never   Passive Exposure: Not on file  Transportation Needs: No Transportation Needs   Lack of Transportation (Medical): No   Lack of Transportation (Non-Medical): No    Care Plan : Diabetes Type 2 (Adult)  Updates made by [name], RN since 02/17/2021 12:00 AM     Problem: Disease Progression (Diabetes, Type 2)      Long-Range Goal: Disease Progression Prevented or Minimized-Patient will maintain A1C level of 7.0 or less Completed 02/17/2021  Start Date: 04/27/2020  Expected End Date: 02/05/2021  Recent Progress: On track  Priority: High  Note:    Notes:   04/27/20 RN CM educated patient on importance of adhering to testing frequency and normal parameters RN CM discussed importance of routine A1C testing and the need to talk with MD regarding next lab work date. RN CM reviewed cbg monitoring log and values with patient/caregiver. RN CM assessed for med adherence and completed med review and review DM meds  RN CM provided education on diabetic diet  Barriers: Health Behaviors Knowledge   06/08/20 RN CM assessed for diabetic adherence. RN CM reinforced diabetic diet.  11/26/2020 RN CM assessed for diabetic adherence. RN CM  reinforced diabetic diet.  09/21/2020 RN CM assessed for any acute issues or sxs. RN CM assessed nutritional status and provided education.   10/26/20 RN CM assessed nutritional status and DM mgmt  9/67/89-[id number] due to duplicate care plan and goals    Task: Monitor and Manage Follow-up for Co morbidities Completed 02/17/2021  Note:   Care Management Activities:    RN CM assessed for med adherence and completed med review and review DM meds  RN CM provided education on diabetic diet    Notes:     Care Plan : Cancer Treatment Phase (Adult)  Updates made by [name], RN since 02/17/2021 12:00 AM     Problem: Anorexia, Nausea and Vomiting      Long-Range Goal: Anorexia, Nausea and Vomiting Managed-Patient will tolerate chemo txs with minimal to no SEs reported over the next 90 days Completed 02/17/2021  Start Date: 06/08/2020  Expected End Date: 02/05/2021  This Visit's Progress: On track  Recent Progress: On track  Priority: High  Note:    Notes:   Barriers: Knowledge   06/08/20 RN CM discussed recent MD appt and resumption of chemo and pt's previous response to txs  10/26/20  RN CM assessed for any SEs from tx RN CM reviewed current tx plan.   11/26/20 RN CM assessed for any SEs from tx  4/43/15-[id number] due to duplicate care plan and goals    Task: Alleviate Barriers to Anorexia, Nausea and Vomiting Management Completed 02/17/2021  Note:   Care Management Activities:    - fluid status assessed and trended - weight assessed and trended    Notes:     Care Plan : RN Care Manager POC  Updates made by [name], RN since 02/17/2021 12:00 AM     Problem: Chronic Disease Mgmt of Chronic Condition-lung CA, DM   Priority: High     Long-Range Goal: Development of POC for Mgmt of Chronic Condition-   Start Date: 02/17/2021  Expected End Date: 02/17/2022  Priority: High  Note:    Current Barriers:  Chronic Disease  Management support and education needs related to DMII   RN CM Clinical Goal(s):  Patient will verbalize understanding of plan for management of DMII as evidenced by mgmt of chronic condition demonstrate Ongoing health management independence as evidenced by A1C level within target range continue to work with RN Care Manager to address care management and care coordination needs related to  DMII as evidenced by adherence to CM Team Scheduled appointments through collaboration with RN Care manager, provider, and care team.   Interventions: POC sent to PCP upon initial assessment, quarterly and with any changes in patient's conditions Inter-disciplinary care team collaboration (see longitudinal plan of care) Evaluation of current treatment plan related to  self management and patient's adherence to plan as established by provider   Diabetes Interventions:  (Status:  New goal.) Long Term Goal Assessed patient's understanding of A1c goal: <7% Reviewed medications with patient and discussed importance of medication adherence Discussed plans with patient for ongoing care management follow up and provided patient with direct contact information for care management team Advised patient, providing education and rationale, to check cbg as ordered and record, calling MD for findings outside established parameters Lab Results  Component Value Date   HGBA1C 6.0 (A) 07/30/2020   Patient Goals/Self-Care Activities: Take all medications as prescribed Attend all scheduled provider appointments Call provider office for new concerns or questions  schedule appointment with eye doctor check feet daily for cuts, sores or redness take the blood sugar log to all doctor visits  Follow Up Plan:  Telephone follow up appointment with care management team member scheduled for:  within the month of March The patient has been provided with contact information for the care management team and has been advised to call  with any health related questions or concerns.      Long-Range Goal: Development of POC for Mgmt of Chronic Condition-   Start Date: 02/17/2021  Expected End Date: 02/17/2022  Priority: High  Note:   Current Barriers:  Chronic Disease Management support and education needs related to Cancer(lung)   RNCM Clinical Goal(s):  Patient will verbalize understanding of plan for management of cancer as evidenced by mgmt of chronic condition take all medications exactly as prescribed and will call provider for medication related questions as evidenced by med adherence/compliance attend all scheduled medical appointments: including cancer tx appts as evidenced by completion of chemo/radiation tx sessions continue to work with RN Care Manager to address care management and care coordination needs related to  cancer as evidenced by adherence to CM Team Scheduled appointments through collaboration with RN Care manager, provider, and care team.  Interventions: POC sent to PCP upon initial assessment, quarterly and with any changes in patient's conditions Inter-disciplinary care team collaboration (see longitudinal plan of care) Evaluation of current treatment plan related to  self management and patient's adherence to plan as established by provider   Oncology:  (Status: New goal.) Long Term Goal Assessment of understanding of oncology diagnosis:  Assessed patient understanding of cancer diagnosis and recommended treatment plan, Reviewed upcoming provider appointments and treatment appointments, Assessed support system. Has consistent/reliable family or other support: Yes, and Nutrition assessment performed  Patient Goals/Self-Care Activities: Take all medications as prescribed Attend all scheduled provider appointments Call provider office for new concerns or questions   Follow Up Plan:  Telephone follow up appointment with care management team member scheduled for:  within the month of March The  patient has been provided with contact information for the care management team and has been advised to call with any health related questions or concerns.         Plan: RN CM discussed with caregiver next outreach within the month of March . Caregiver agrees to care plan and follow up. RN CM will send quarterly update to PCP.   [name], RN,BSN,CCM [facility] Telephonic Care Management Coordinator Direct Phone: [phone] Toll Free: [phone] Fax: [phone]

## 2021-02-21 IMAGING — CR DG ABDOMEN ACUTE W/ 1V CHEST
4 series · 4 of 4 positions shown · non-contrast
Comparison: CT 08/16/2019

CLINICAL DATA: Constipation for 5 days

EXAM:
DG ABDOMEN ACUTE W/ 1V CHEST

[w chest pa]
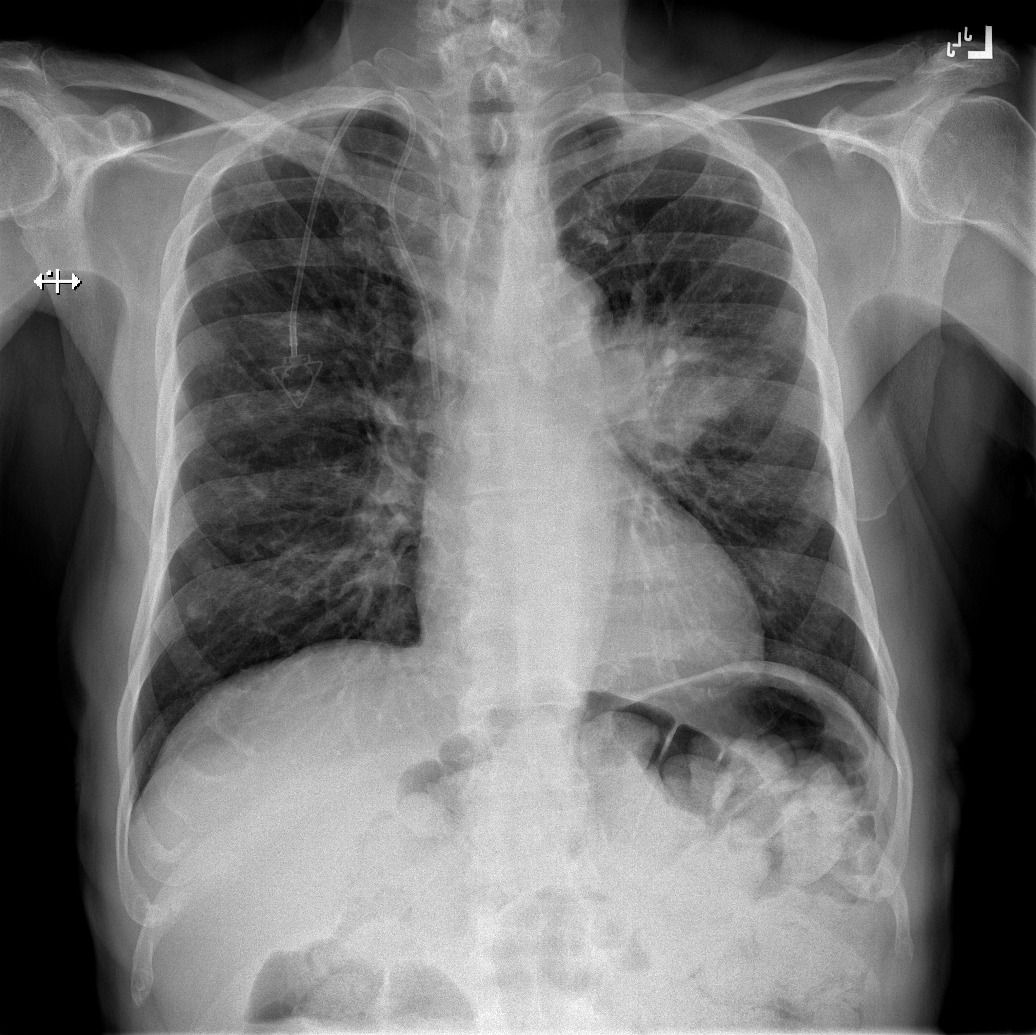

[w abdomen upright]
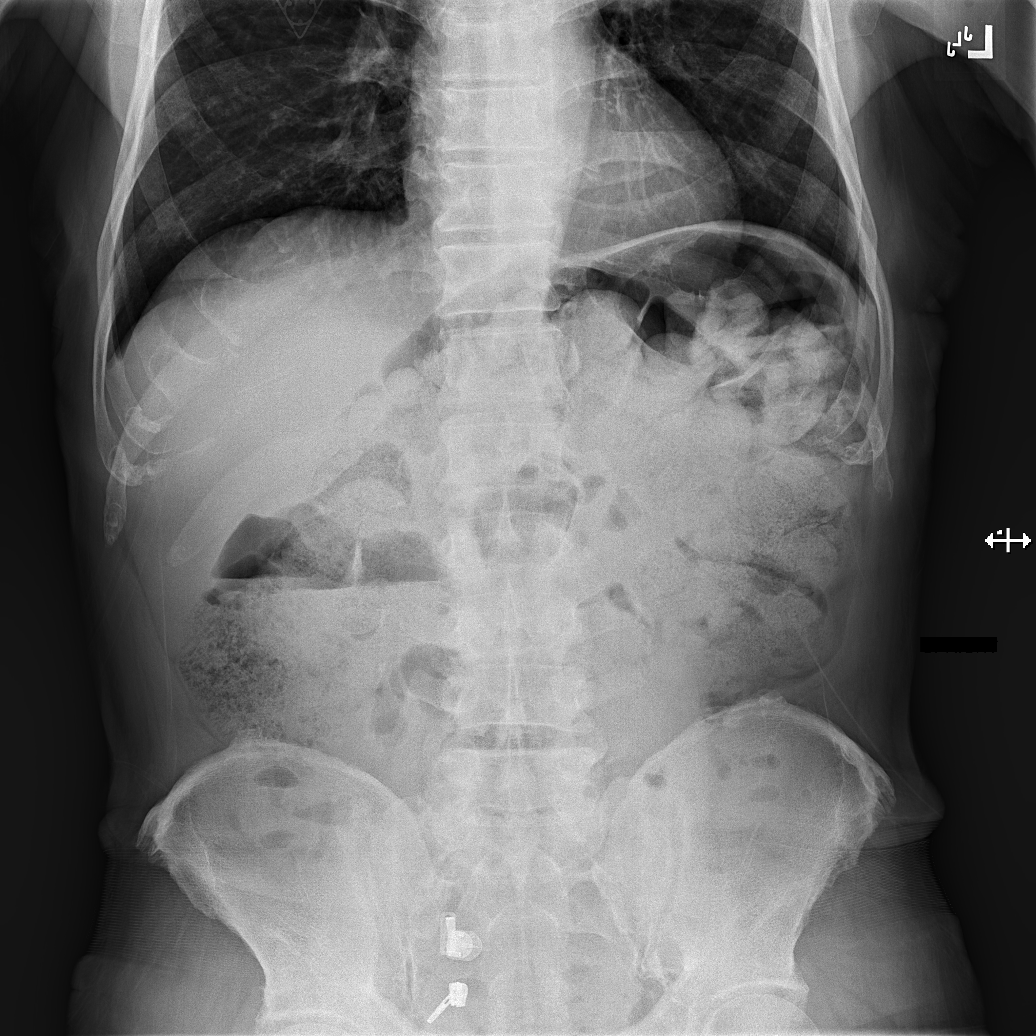

[t abdomen supine (1 of 2)]
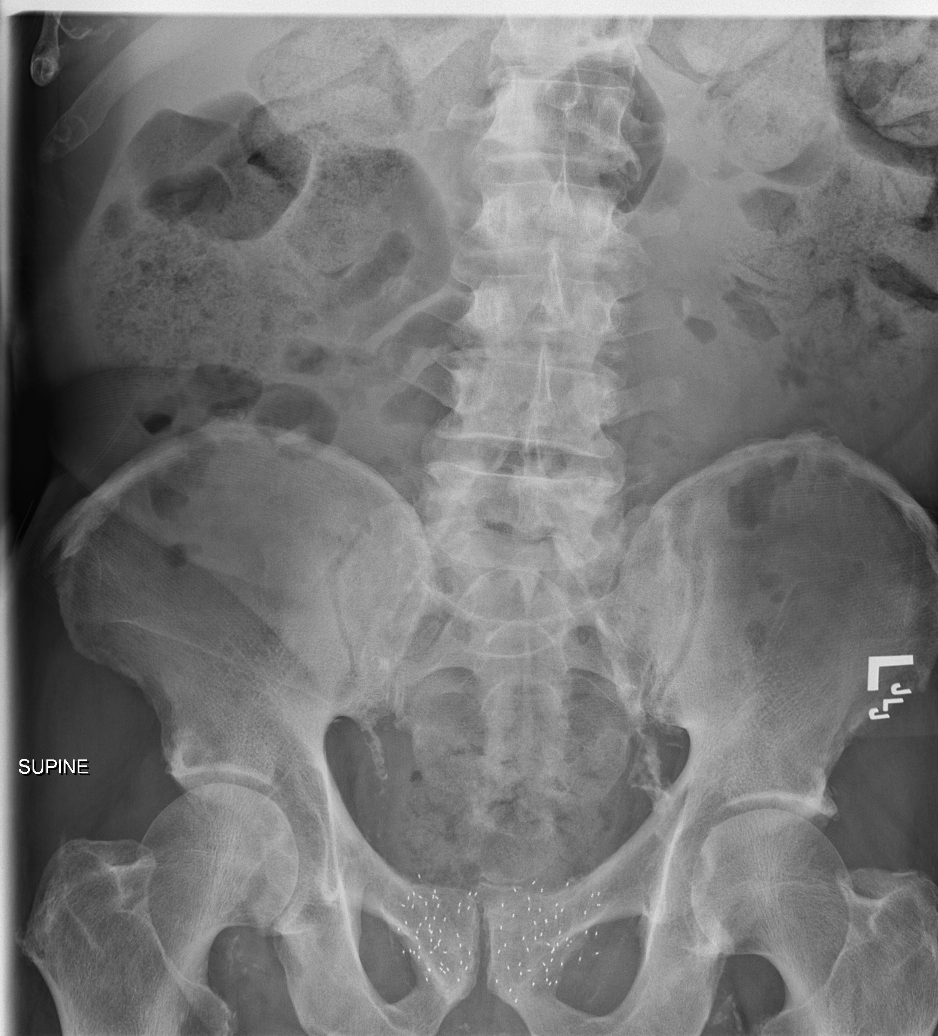

[t abdomen supine (2 of 2)]
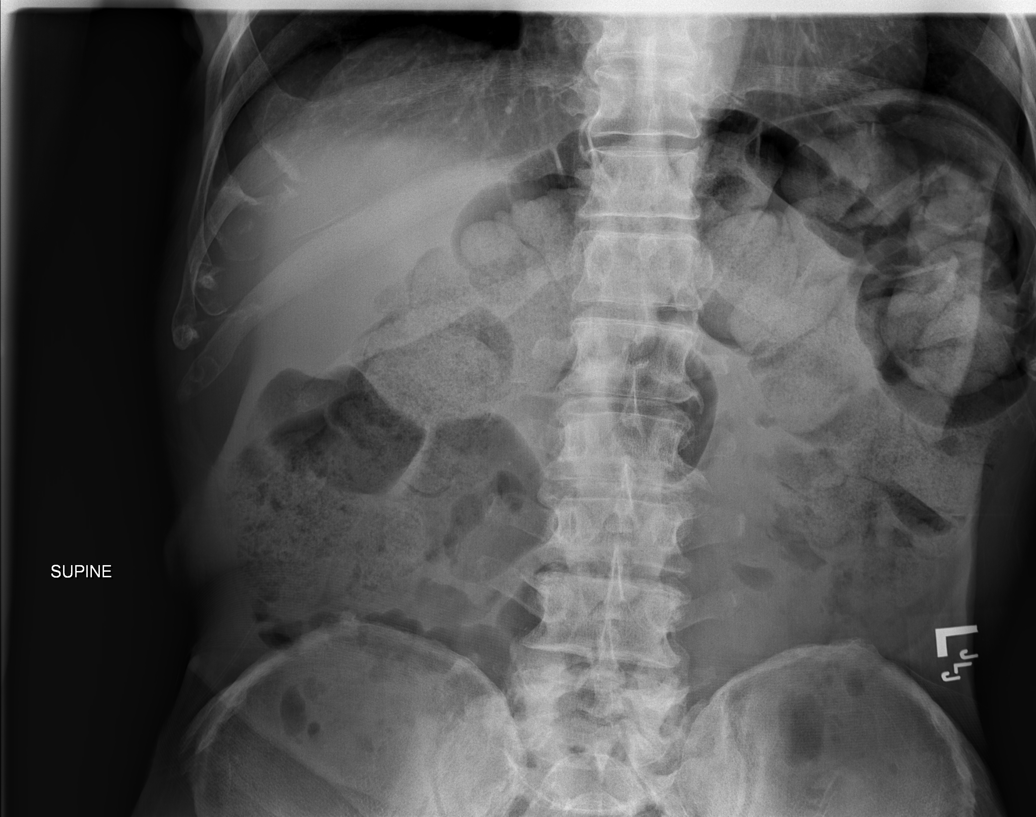

[4 of 4 positions shown; findings below may reference images not displayed]

FINDINGS: Power port type central venous catheter with tip over the low SVC
region. No pneumothorax. Heart size and pulmonary vascularity are
normal. Left hilar/perihilar mass is better demonstrated on previous
CT chest. No pleural effusion or consolidation. Additional nodules
in the left lung apex.

Gas and stool throughout the colon. No small or large bowel
distention. No free intra-abdominal air. No abnormal air-fluid
levels. No radiopaque stones. Radiopaque densities in the pelvis
likely represent prostate seed implants. Vascular calcifications.
Degenerative changes in the spine and hips.
IMPRESSION: 1. Nonobstructive bowel gas pattern with stool-filled colon.
2. Left hilar/perihilar mass with additional nodules in the left
lung apex.

## 2021-02-22 ENCOUNTER — Other Ambulatory Visit: Payer: Self-pay

## 2021-02-22 ENCOUNTER — Ambulatory Visit (INDEPENDENT_AMBULATORY_CARE_PROVIDER_SITE_OTHER): Payer: Medicare HMO | Admitting: Physician Assistant

## 2021-02-22 ENCOUNTER — Encounter: Payer: Self-pay | Admitting: Physician Assistant

## 2021-02-22 ENCOUNTER — Ambulatory Visit (HOSPITAL_COMMUNITY): Payer: Medicare HMO

## 2021-02-22 VITALS — BP 118/70 | HR 74 | Temp 98.3°F | Ht 67.0 in | Wt 184.0 lb

## 2021-02-22 DIAGNOSIS — K4090 Unilateral inguinal hernia, without obstruction or gangrene, not specified as recurrent: Secondary | ICD-10-CM

## 2021-02-22 DIAGNOSIS — Z09 Encounter for follow-up examination after completed treatment for conditions other than malignant neoplasm: Secondary | ICD-10-CM

## 2021-02-22 NOTE — Progress Notes (Signed)
[facility] POST-OP OFFICE VISIT  02/22/2021  HPI: Shaun Kennedy is a 81 y.o. male 10 days s/p robotic assisted laparoscopic right inguinal hernia repair with mesh with Dr Christian Mate  He is doing quite well Some soreness with standing but only needing intermittent Motrin for this No fever, chills, nausea, emesis, or bowel changes No scrotal swelling No issues with PO intake No issues with his incisions  Vital signs: BP 118/70    Pulse 74    Temp 98.3 F (36.8 C)    Ht 5\' 7"  (1.702 m)    Wt 184 lb (83.5 kg)    SpO2 98%    BMI 28.82 kg/m    Physical Exam: Constitutional: Well appearing male, NAD Abdomen: Soft, non-tender, non-distended, no rebound/guarding Skin: Laparoscopic incisions are healing well, no erythema or drainage   Assessment/Plan: This is a 81 y.o. male 10 days s/p robotic assisted laparoscopic right inguinal hernia repair with mesh   - Pain control prn  - Reviewed wound care recommendation  - Reviewed lifting restrictions; 6 weeks total  - He can follow up on as needed basis; He understands to call with questions/concerns  -- Edison Simon, PA-C [facility] 02/22/2021, 10:59 AM [phone] M-F: 7am - 4pm

## 2021-02-22 NOTE — Patient Instructions (Signed)

## 2021-02-23 ENCOUNTER — Other Ambulatory Visit: Payer: Self-pay | Admitting: Family Medicine

## 2021-02-23 LAB — SARS CORONAVIRUS 2 (TAT 6-24 HRS): SARS Coronavirus 2: NEGATIVE

## 2021-02-24 ENCOUNTER — Ambulatory Visit (HOSPITAL_COMMUNITY)
Admission: RE | Admit: 2021-02-24 | Discharge: 2021-02-24 | Disposition: A | Discharge status: Home / Self Care | Payer: Medicare HMO | Source: Ambulatory Visit | Attending: Acute Care | Admitting: Acute Care

## 2021-02-24 ENCOUNTER — Other Ambulatory Visit: Payer: Self-pay

## 2021-02-24 ENCOUNTER — Inpatient Hospital Stay: Payer: Medicare HMO

## 2021-02-24 ENCOUNTER — Inpatient Hospital Stay: Payer: Medicare HMO | Admitting: Internal Medicine

## 2021-02-24 ENCOUNTER — Inpatient Hospital Stay: Payer: Medicare HMO | Attending: Internal Medicine

## 2021-02-24 VITALS — Temp 98.9°F

## 2021-02-24 VITALS — BP 137/66 | HR 68 | Temp 97.4°F | Resp 19 | Ht 67.0 in | Wt 182.0 lb

## 2021-02-24 DIAGNOSIS — Z79899 Other long term (current) drug therapy: Secondary | ICD-10-CM | POA: Diagnosis not present

## 2021-02-24 DIAGNOSIS — Z430 Encounter for attention to tracheostomy: Secondary | ICD-10-CM | POA: Insufficient documentation

## 2021-02-24 DIAGNOSIS — C3432 Malignant neoplasm of lower lobe, left bronchus or lung: Secondary | ICD-10-CM

## 2021-02-24 DIAGNOSIS — Z5112 Encounter for antineoplastic immunotherapy: Secondary | ICD-10-CM

## 2021-02-24 DIAGNOSIS — Z95828 Presence of other vascular implants and grafts: Secondary | ICD-10-CM

## 2021-02-24 LAB — CMP (CANCER CENTER ONLY)
ALT: 10 U/L (ref 0–44)
AST: 14 U/L — ABNORMAL LOW (ref 15–41)
Albumin: 3.7 g/dL (ref 3.5–5.0)
Alkaline Phosphatase: 56 U/L (ref 38–126)
Anion gap: 5 (ref 5–15)
BUN: 18 mg/dL (ref 8–23)
CO2: 27 mmol/L (ref 22–32)
Calcium: 9.1 mg/dL (ref 8.9–10.3)
Chloride: 106 mmol/L (ref 98–111)
Creatinine: 1.21 mg/dL (ref 0.61–1.24)
GFR, Estimated: 60 mL/min — ABNORMAL LOW (ref >60)
Glucose, Bld: 112 mg/dL — ABNORMAL HIGH (ref 70–99)
Potassium: 3.9 mmol/L (ref 3.5–5.1)
Sodium: 138 mmol/L (ref 135–145)
Total Bilirubin: 0.7 mg/dL (ref 0.3–1.2)
Total Protein: 7.8 g/dL (ref 6.5–8.1)

## 2021-02-24 LAB — CBC WITH DIFFERENTIAL (CANCER CENTER ONLY)
Abs Immature Granulocytes: 0 10*3/uL (ref 0.00–0.07)
Basophils Absolute: 0 10*3/uL (ref 0.0–0.1)
Basophils Relative: 0 %
Eosinophils Absolute: 0.4 10*3/uL (ref 0.0–0.5)
Eosinophils Relative: 7 %
HCT: 31.3 % — ABNORMAL LOW (ref 39.0–52.0)
Hemoglobin: 10.5 g/dL — ABNORMAL LOW (ref 13.0–17.0)
Immature Granulocytes: 0 %
Lymphocytes Relative: 25 %
Lymphs Abs: 1.5 10*3/uL (ref 0.7–4.0)
MCH: 31.3 pg (ref 26.0–34.0)
MCHC: 33.5 g/dL (ref 30.0–36.0)
MCV: 93.2 fL (ref 80.0–100.0)
Monocytes Absolute: 0.5 10*3/uL (ref 0.1–1.0)
Monocytes Relative: 8 %
Neutro Abs: 3.5 10*3/uL (ref 1.7–7.7)
Neutrophils Relative %: 60 %
Platelet Count: 279 10*3/uL (ref 150–400)
RBC: 3.36 MIL/uL — ABNORMAL LOW (ref 4.22–5.81)
RDW: 12 % (ref 11.5–15.5)
WBC Count: 5.9 10*3/uL (ref 4.0–10.5)
nRBC: 0 % (ref 0.0–0.2)

## 2021-02-24 LAB — TSH: TSH: 0.923 u[IU]/mL (ref 0.320–4.118)

## 2021-02-24 MED ORDER — SODIUM CHLORIDE 0.9 % IV SOLN
360.0000 mg | Freq: Once | INTRAVENOUS | Status: AC
  Administered 2021-02-24: 360 mg via INTRAVENOUS
  Filled 2021-02-24: qty 10

## 2021-02-24 MED ORDER — SODIUM CHLORIDE 0.9% FLUSH
10.0000 mL | INTRAVENOUS | Status: DC | PRN
  Administered 2021-02-24: 10 mL

## 2021-02-24 MED ORDER — HEPARIN SOD (PORK) LOCK FLUSH 100 UNIT/ML IV SOLN
500.0000 [IU] | Freq: Once | INTRAVENOUS | Status: AC | PRN
  Administered 2021-02-24: 500 [IU]

## 2021-02-24 MED ORDER — SODIUM CHLORIDE 0.9 % IV SOLN: Freq: Once | INTRAVENOUS | Status: AC

## 2021-02-24 MED ORDER — SODIUM CHLORIDE 0.9% FLUSH
10.0000 mL | Freq: Once | INTRAVENOUS | Status: AC
  Administered 2021-02-24: 10 mL

## 2021-02-24 NOTE — Patient Instructions (Signed)
[facility] MEDICAL ONCOLOGY  Discharge Instructions: Thank you for choosing [facility] to provide your oncology and hematology care.   If you have a lab appointment with the Cancer Center, please go directly to the Cancer Center and check in at the registration area.   Wear comfortable clothing and clothing appropriate for easy access to any Portacath or PICC line.   We strive to give you quality time with your provider. You may need to reschedule your appointment if you arrive late (15 or more minutes).  Arriving late affects you and other patients whose appointments are after yours.  Also, if you miss three or more appointments without notifying the office, you may be dismissed from the clinic at the provider's discretion.      For prescription refill requests, have your pharmacy contact our office and allow 72 hours for refills to be completed.    Today you received the following chemotherapy and/or immunotherapy agents: Nivolumab.      To help prevent nausea and vomiting after your treatment, we encourage you to take your nausea medication as directed.  BELOW ARE SYMPTOMS THAT SHOULD BE REPORTED IMMEDIATELY: *FEVER GREATER THAN 100.4 F (38 C) OR HIGHER *CHILLS OR SWEATING *NAUSEA AND VOMITING THAT IS NOT CONTROLLED WITH YOUR NAUSEA MEDICATION *UNUSUAL SHORTNESS OF BREATH *UNUSUAL BRUISING OR BLEEDING *URINARY PROBLEMS (pain or burning when urinating, or frequent urination) *BOWEL PROBLEMS (unusual diarrhea, constipation, pain near the anus) TENDERNESS IN MOUTH AND THROAT WITH OR WITHOUT PRESENCE OF ULCERS (sore throat, sores in mouth, or a toothache) UNUSUAL RASH, SWELLING OR PAIN  UNUSUAL VAGINAL DISCHARGE OR ITCHING   Items with * indicate a potential emergency and should be followed up as soon as possible or go to the Emergency Department if any problems should occur.  Please show the CHEMOTHERAPY ALERT CARD or IMMUNOTHERAPY ALERT CARD at check-in to  the Emergency Department and triage nurse.  Should you have questions after your visit or need to cancel or reschedule your appointment, please contact [facility]ICAL [facility]  Dept: [phone]  and follow the prompts.  Office hours are 8:00 a.m. to 4:30 p.m. Monday - Friday. Please note that voicemails left after 4:00 p.m. may not be returned until the following business day.  We are closed weekends and major holidays. You have access to a nurse at all times for urgent questions. Please call the main number to the clinic Dept: [phone] and follow the prompts.   For any non-urgent questions, you may also contact your provider using MyChart. We now offer e-Visits for anyone 18 and older to request care online for non-urgent symptoms. For details visit [email or web address].   Also download the MyChart app! Go to the app store, search "MyChart", open the app, select [place], and log in with your MyChart username and password.  Due to Covid, a mask is required upon entering the hospital/clinic. If you do not have a mask, one will be given to you upon arrival. For doctor visits, patients may have 1 support person aged 18 or older with them. For treatment visits, patients cannot have anyone with them due to current Covid guidelines and our immunocompromised population.   

## 2021-02-24 NOTE — Progress Notes (Signed)
Tracheostomy Procedure Note  Shaun Kennedy [id number] 02/06/1941  Pre Procedure Tracheostomy Information  Trach Brand: Shiley Size: 6.0  [id number] Style: Uncuffed Secured by: Velcro   Procedure: Trch Cleaning and Trach Change    Post Procedure Tracheostomy Information  Trach Brand: Shiley Size:  6.0  [id number] Style: Uncuffed Secured by: Velcro   Post Procedure Evaluation:  ETCO2 positive color change from yellow to purple : Yes.   Vital signs:VSS Patients current condition: stable Complications: No apparent complications Trach site exam: clean, dry Wound care done: 2 x 2 gauze drain Patient did tolerate procedure well.   Education: Radiation protection practitioner and/ dale collar change with wife  Prescription needs: none    Additional needs: New PMV given to patient at this visit

## 2021-02-24 NOTE — Progress Notes (Signed)
[name] [phone]   [phone]) [phone]  OFFICE PROGRESS NOTE  [name], [place] [address]  DIAGNOSIS: stage IV (T3, N2, M1 a) non-small cell lung cancer, squamous cell carcinoma presented with obstructive left lower lobe lung mass in addition to mediastinal lymphadenopathy as well as bilateral pulmonary nodules diagnosed in July 2021.   PDL1: 0%   PRIOR THERAPY: None   CURRENT THERAPY: 1) Palliative radiotherapy to the obstructive left lower lobe lung mass under the care of Dr. Lisbeth Renshaw.  2)  systemic chemotherapy 2 cycles of chemotherapy with carboplatin for an AUC of 5 and paclitaxel 175 mg/m2 in addition to immunotherapy with nivolumab 360 mg every 3 weeks and ipilimumab 1 mg/kg IV every 6 weeks followed by maintenance nivolumab and ipilimumab.  He started the first treatment on 09/04/2019.  He is status post 12 cycles of the treatment.  INTERVAL HISTORY: Shaun Kennedy 81 y.o. male returns to the clinic today for follow-up visit.  The patient is feeling fine today with no concerning complaints except for lack of appetite but he also eats a lot of snacks during the day.  He denied having any current chest pain, shortness of breath, cough or hemoptysis.  He denied having any fever or chills.  He has no nausea, vomiting, diarrhea or constipation.  He has no headache or visual changes.  He continues to tolerate his treatment with immunotherapy fairly well.  He is here today for evaluation before starting day #22 of cycle #13.  MEDICAL HISTORY: Past Medical History:  Diagnosis Date   Allergic rhinitis    Asthma    Carotid stenosis    Colon cancer ([place]) 2003   Diabetes mellitus ([place])    Diverticulosis    Dyslipidemia    ED (erectile dysfunction)    GERD (gastroesophageal reflux disease)    H/O degenerative disc disease    Hemorrhoids    HTN (hypertension)    as a child   Hyperlipidemia    Lung cancer ([place])     LVH (left ventricular hypertrophy)    on EKG   Mass of lower lobe of left lung    Mediastinal adenopathy    Smoker    former   Wears dentures    Wears dentures    Wears glasses    Wears glasses     ALLERGIES:  has No Known Allergies.  MEDICATIONS:  Current Outpatient Medications  Medication Sig Dispense Refill   Alcohol Swabs (DROPSAFE ALCOHOL PREP) 70 % PADS Apply topically.     amLODipine (NORVASC) 5 MG tablet Take 1 tablet (5 mg total) by mouth daily. 90 tablet 3   aspirin EC 81 MG tablet Take 81 mg by mouth daily after breakfast.      bisacodyl (DULCOLAX) 5 MG EC tablet Take 10 mg by mouth daily as needed for moderate constipation.     Blood Glucose Calibration (TRUE METRIX LEVEL 1) Low SOLN      cholecalciferol (VITAMIN D) 25 MCG (1000 UNIT) tablet Take 1,000 Units by mouth daily.     dronabinol (MARINOL) 2.5 MG capsule Take 1 capsule (2.5 mg total) by mouth 2 (two) times daily before a meal. 60 capsule 1   ferrous sulfate 325 (65 FE) MG EC tablet TAKE 1 TABLET (325 MG TOTAL) DAILY WITH BREAKFAST. 90 tablet 1   Lancet Devices (TRUEDRAW LANCING DEVICE) MISC Check sugars daily 90 each 0   lisinopril-hydrochlorothiazide (ZESTORETIC) 20-12.5 MG  tablet Take 1 tablet by mouth daily.     metFORMIN (GLUCOPHAGE-XR) 500 MG 24 hr tablet TAKE 1 TABLET EVERY DAY 90 tablet 1   Multiple Vitamin (MULTIVITAMIN WITH MINERALS) TABS tablet Take 1 tablet by mouth daily after breakfast.      simvastatin (ZOCOR) 40 MG tablet Take 40 mg by mouth daily.     solifenacin (VESICARE) 5 MG tablet TAKE 1 TABLET EVERY DAY 90 tablet 1   TRUE METRIX BLOOD GLUCOSE TEST test strip SMARTSIG:Via Meter     TRUEplus Lancets 33G MISC 1 each by Does not apply route 2 (two) times daily. 200 each 4   No current facility-administered medications for this visit.   Facility-Administered Medications Ordered in Other Visits  Medication Dose Route Frequency Provider Last Rate Last Admin   sodium chloride flush (NS) 0.9 %  injection 10 mL  10 mL Intracatheter PRN Curt Bears, MD   10 mL at 09/04/19 1556    SURGICAL HISTORY:  Past Surgical History:  Procedure Laterality Date   BRONCHIAL BIOPSY  08/20/2019   Procedure: BRONCHIAL BIOPSIES;  Surgeon: Collene Gobble, MD;  Location: [facility] ENDOSCOPY;  Service: Pulmonary;;   BRONCHIAL BRUSHINGS  08/20/2019   Procedure: BRONCHIAL BRUSHINGS;  Surgeon: Collene Gobble, MD;  Location: [facility] ENDOSCOPY;  Service: Pulmonary;;   BRONCHIAL NEEDLE ASPIRATION BIOPSY  08/20/2019   Procedure: BRONCHIAL NEEDLE ASPIRATION BIOPSIES;  Surgeon: Collene Gobble, MD;  Location: [place];  Service: Pulmonary;;   CARPAL TUNNEL RELEASE Right 01/09/2019   Procedure: CARPAL TUNNEL RELEASE;  Surgeon: Leandrew Koyanagi, MD;  Location: [facility];  Service: Orthopedics;  Laterality: Right;   CARPAL TUNNEL WITH CUBITAL TUNNEL Right 11/18/2020   Procedure: CARPAL TUNNEL WITH CUBITAL TUNNEL;  Surgeon: Charlotte Crumb, MD;  Location: [place];  Service: Orthopedics;  Laterality: Right;   CATARACT EXTRACTION Right 2018   COLONOSCOPY  2007   Dr. Benson Norway   DIRECT LARYNGOSCOPY N/A 04/10/2020   Procedure: DIRECT LARYNGOSCOPY;  Surgeon: Melida Quitter, MD;  Location: [place];  Service: ENT;  Laterality: N/A;   I & D EXTREMITY Right 04/02/2016   Procedure: IRRIGATION AND DEBRIDEMENT GREAT TOE;  Surgeon: Leandrew Koyanagi, MD;  Location: [place];  Service: Orthopedics;  Laterality: Right;   IR IMAGING GUIDED PORT INSERTION  08/30/2019   MULTIPLE TOOTH EXTRACTIONS     RADIOACTIVE SEED IMPLANT  2003   TRACHEOSTOMY TUBE PLACEMENT N/A 04/10/2020   Procedure: TRACHEOSTOMY;  Surgeon: Melida Quitter, MD;  Location: [facility];  Service: ENT;  Laterality: N/A;   ULNAR TUNNEL RELEASE Right 01/09/2019   Procedure: RIGHT CUBITAL TUNNEL RELEASE AND CARPAL TUNNEL RELEASE;  Surgeon: Leandrew Koyanagi, MD;  Location: [facility];  Service: Orthopedics;  Laterality: Right;   UPPER GASTROINTESTINAL ENDOSCOPY     VIDEO  BRONCHOSCOPY N/A 12/18/2019   Procedure: VIDEO BRONCHOSCOPY WITHOUT FLUORO;  Surgeon: Collene Gobble, MD;  Location: [facility] ENDOSCOPY;  Service: Cardiopulmonary;  Laterality: N/A;   VIDEO BRONCHOSCOPY WITH ENDOBRONCHIAL ULTRASOUND N/A 08/20/2019   Procedure: VIDEO BRONCHOSCOPY WITH ENDOBRONCHIAL ULTRASOUND;  Surgeon: Collene Gobble, MD;  Location[facility] ENDOSCOPY;  Service: Pulmonary;  Laterality: N/A;    REVIEW OF SYSTEMS:  A comprehensive review of systems was negative except for: Constitutional: positive for anorexia, fatigue, and weight loss   PHYSICAL EXAMINATION: General appearance: alert, cooperative, and no distress Head: Normocephalic, without obvious abnormality, atraumatic Neck: no adenopathy, no JVD, supple, symmetrical, trachea midline, and thyroid not enlarged, symmetric, no tenderness/mass/nodules  Lymph nodes: Cervical, supraclavicular, and axillary nodes normal. Resp: clear to auscultation bilaterally Back: symmetric, no curvature. ROM normal. No CVA tenderness. Cardio: regular rate and rhythm, S1, S2 normal, no murmur, click, rub or gallop GI: soft, non-tender; bowel sounds normal; no masses,  no organomegaly Extremities: extremities normal, atraumatic, no cyanosis or edema  ECOG PERFORMANCE STATUS: 1 - Symptomatic but completely ambulatory  Blood pressure 137/66, pulse 68, temperature (!) 97.4 F (36.3 C), temperature source Tympanic, resp. rate 19, height '5\' 7"'  (1.702 m), weight 182 lb (82.6 kg), SpO2 99 %.  LABORATORY DATA: Lab Results  Component Value Date   WBC 5.9 02/24/2021   HGB 10.5 (L) 02/24/2021   HCT 31.3 (L) 02/24/2021   MCV 93.2 02/24/2021   PLT 279 02/24/2021      Chemistry      Component Value Date/Time   NA 140 02/04/2021 0756   NA 132 (L) 08/14/2019 0918   K 3.9 02/04/2021 0756   CL 107 02/04/2021 0756   CO2 27 02/04/2021 0756   BUN 19 02/04/2021 0756   BUN 16 08/14/2019 0918   CREATININE 1.34 (H) 02/04/2021 0756   CREATININE 1.25 (H)  08/08/2016 0912      Component Value Date/Time   CALCIUM 9.1 02/04/2021 0756   ALKPHOS 52 02/04/2021 0756   AST 13 (L) 02/04/2021 0756   ALT 10 02/04/2021 0756   BILITOT 0.5 02/04/2021 0756       RADIOGRAPHIC STUDIES: No results found.  ASSESSMENT AND PLAN: This is a very pleasant 81 years old [name] with stage IV non-small cell lung cancer, squamous cell carcinoma with negative PD-L1 expression. The patient is currently undergoing systemic chemotherapy with carboplatin and paclitaxel for 2 cycles in addition to immunotherapy with ipilimumab 1 mg/KG every 6 weeks and nivolumab 360 mg IV every 3 weeks status post 12 cycles. The patient continues to tolerate this treatment well with no concerning adverse effects.  I recommended for him to proceed with day #22 of cycle #13 today as planned. He is scheduled to have repeat CT scan of the chest, abdomen and pelvis next week. I will see the patient back for follow-up visit at that time for evaluation and discussion of his scan results before starting cycle #14. He was advised to call immediately if he has any other concerning symptoms in the interval. The patient voices understanding of current disease status and treatment options and is in agreement with the current care plan.  All questions were answered. The patient knows to call the clinic with any problems, questions or concerns. We can certainly see the patient much sooner if necessary.  Disclaimer: This note was dictated with voice recognition software. Similar sounding words can inadvertently be transcribed and may not be corrected upon review.

## 2021-02-24 NOTE — Progress Notes (Incomplete)
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 Also actively being treated w/ stage IV T3/N2/M1 a Non-small cell lung cancer (presenting w/ LLL lung Mass and adenopathy. Currently undergoing XRT (m[facility]y), and systemic chemo s/p 2 cycles chemo w/ carboplatin and paclitaxel in addition to immunotherapy w/ Nivolumab and ipilimumab. Was followed by ENT. Now being referred to [facility] clinic for on-going trach management.   Past Medical History:  Diagnosis Date   Allergic rhinitis, asthma, carotid stenosis, colon cancer, DM, diverticulosis, GERD, DJD, HTN, HL, Lung cancer, former smoker     Family History  Problem Relation Age of Onset   Heart failure, DM, stroke, Lung cancer, diabetes, colon polyps   Mother   No Known Allergies  Prior to Admission medications   Medication  amLODipine (NORVASC) 5 MG tablet, aspirin EC 81 MG tablet,bisacodyl (DULCOLAX) 5 MG EC tablet,cholecalciferol (VITAMIN D) 25 MCG (1000 UNIT) tablet,dronabinol (MARINOL) 2.5 MG capsule, ferrous sulfate 325 (65 FE) MG EC tablet,lisinopril-hydrochlorothiazide (ZESTORETIC) 20-12.5 MG tablet, metFORMIN (GLUCOPHAGE-XR) 500 MG 24 hr tablet, Multiple Vitamin (MULTIVITAMIN WITH MINERALS) TABS tablet,simvastatin (ZOCOR) 40 MG tablet,solifenacin (VESICARE) 5 MG tablet   ROS  Exam    Impression/plan  Trach Dependence 2/2 glottis stenosis  Lung cancer w/ metastasis  H/o asthma  H/o colon cancer  DM   Discussion   Plan

## 2021-02-24 NOTE — Patient Instructions (Signed)
[employer], Adult A central line is a long, thin tube (catheter) that is put into a vein so that it goes to a large vein above your heart. It can be used to: Give you medicine or fluids. Give you food and nutrients. Take blood or give you blood for testing or treatments. Types of central lines There are four main types of central lines: Peripherally inserted central catheter (PICC) line. This type is usually put in the upper arm and goes up the arm to the heart. Tunneled central line. This type is placed in a large vein in the neck, chest, or groin. It is tunneled under the skin and brought out through a second incision. Non-tunneled central line. This type is used for a shorter time than other types, usually for 7 days at the most. It is inserted in the neck, chest, or groin. Implanted port. This type can stay in place longer than other types of central lines. It is normally put in the upper chest but can also be placed in the upper arm or the belly. Surgery is needed to put it in and take it out. The type of central line you get will depend on how long you need it and your medical condition. Tell a doctor about: Any allergies you have. All medicines you are taking. These include vitamins, herbs, eye drops, creams, and over-the-counter medicines. Any problems you or family members have had with anesthetic medicines. Any blood disorders you have. Any surgeries you have had. Any medical conditions you have. Whether you are pregnant or may be pregnant. What are the risks? Generally, central lines are safe. However, problems may occur, including: Infection. A blood clot. Bleeding from the place where the central line was inserted. Getting a hole or crack in the central line. If this happens, the central line will need to be replaced. Central line failure. The catheter moving or coming out of place. What happens before the procedure? Medicines Ask your doctor about changing or  stopping: Your normal medicines. Vitamins, herbs, and supplements. Over-the-counter medicines. Do not take aspirin or ibuprofen unless you are told to. General instructions Follow instructions from your doctor about eating or drinking. For your safety, your doctor may: [place] the area of the procedure. Remove hair at the procedure site. Ask you to wash with a soap that kills germs. Plan to have a responsible adult take you home from the hospital or clinic. If you will be going home right after the procedure, plan to have a responsible adult care for you for the time you are told. This is important. What happens during the procedure? An IV tube will be put into one of your veins. You may be given: A sedative. This medicine helps you relax. Anesthetics. These medicines numb certain areas of your body. Your skin will be cleaned with a germ-killing (antiseptic) solution. You may be covered with clean drapes. Your blood pressure, heart rate, breathing rate, and blood oxygen level will be monitored during the procedure. The central line will be put into the vein and moved through it to the correct spot. The doctor may use X-ray equipment to help guide the central line to the right place. A bandage (dressing) will be placed over the insertion area. The procedure may vary among doctors and hospitals. What can I expect after the procedure? You will be monitored until you leave the hospital or clinic. This includes checking your blood pressure, heart rate, breathing rate, and blood oxygen level. Caps may  be placed on the ends of the central line tubing. If you were given a sedative during your procedure, do not drive or use machines until your doctor says that it is safe. Follow these instructions at home: Caring for the tube  Follow instructions from your doctor about: Flushing the tube. Cleaning the tube and the area around it. Only use germ-free (sterile) supplies to flush. The supplies  should be from your doctor, a pharmacy, or another place that your doctor recommends. Before you flush the tube or clean the area around the tube: Wash your hands with soap and water for at least 20 seconds. If you cannot use soap and water, use hand sanitizer. Clean the central line hub with rubbing alcohol. To do this: Scrub it using a twisting motion and rub for 10 to 15 seconds or for 30 twists. Follow the manufacturer's instructions. Be sure you scrub the top of the hub, not just the sides. Never reuse alcohol pads. Let the hub dry before use. Keep it from touching anything while drying. Caring for your skin Check the skin around the central line every day for signs of infection. Check for: Redness, swelling, or pain. Fluid or blood. Warmth. Pus or a bad smell. Keep the area where the tube was put in clean and dry. Change bandages only as told by your doctor. Keep your bandage dry. If a bandage gets wet, have it changed right away. General instructions Keep the tube clamped, unless it is being used. If you or someone else accidentally pulls on the tube, make sure: The bandage is okay. There is no bleeding. The tube has not been pulled out. Do not use scissors or sharp objects near the tube. Do not take baths, swim, or use a hot tub until your doctor says it is okay. Ask your doctor if you may take showers. You may only be allowed to take sponge baths. Ask your doctor what activities are safe for you. Your doctor may tell you not to lift anything or move your arm too much. Take over-the-counter and prescription medicines only as told by your doctor. Keep all follow-up visits. Storing and throwing away supplies Keep your supplies in a clean, dry location. Throw away any used syringes in a container that is only for sharp items (sharps container). You can buy a sharps container from a pharmacy, or you can make one by using an empty hard plastic bottle with a cover. Place any used  bandages or infusion bags into a plastic bag. Throw that bag in the trash. Contact a doctor if: You have any of these signs of infection where the tube was put in: Redness, swelling, or pain. Fluid or blood. Warmth. Pus or a bad smell. Get help right away if: You have: A fever or chills. Shortness of breath. Pain in your chest. A fast heartbeat. Swelling in your neck, face, chest, or arm. You feel dizzy or you faint. There are red lines coming from where the tube was put in. The area where the tube was put in is bleeding and the bleeding will not stop. Your tube is hard to flush. You do not get a blood return from the tube. The tube gets loose or comes out. The tube has a hole or a tear. The tube leaks. Summary A central line is a long, thin tube (catheter) that is put in your vein. It can be used to give you medicine, food, or fluids. Follow instructions from your doctor about flushing  and cleaning the tube. Keep the area where the tube was put in clean and dry. Ask your doctor what activities are safe for you. This information is not intended to replace advice given to you by your health care provider. Make sure you discuss any questions you have with your health care provider. Document Revised: 09/26/2019 Document Reviewed: 09/26/2019 Elsevier Patient Education  2022 [employer].

## 2021-02-25 IMAGING — CT NM PET TUM IMG INITIAL (PI) SKULL BASE T - THIGH
7 series · 25 of 25 positions shown · non-contrast
Comparison: Chest CT 08/16/2019

CLINICAL DATA: Initial treatment strategy for non-small cell lung
cancer.

EXAM:
NUCLEAR MEDICINE PET SKULL BASE TO THIGH
TECHNIQUE: 7.9 mCi F-18 FDG was injected intravenously. Full-ring PET imaging
was performed from the skull base to thigh after the radiotracer. CT
data was obtained and used for attenuation correction and anatomic
localization.
Fasting blood glucose: 132 mg/dl

[Series 3: pet sk_thigh ac · axial · 5.0mm · 4.07mm/px · z∈[-1502,-534]mm · 5 of 243 slices shown]
[im 1/243]
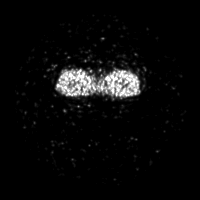
[im 61/243]
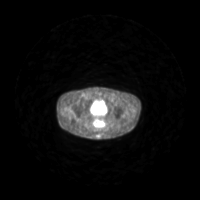
[im 122/243]
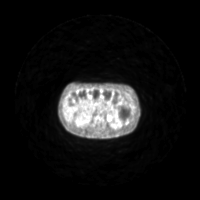
[im 182/243]
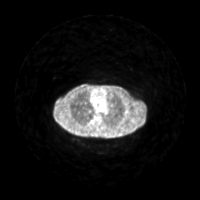
[im 243/243]
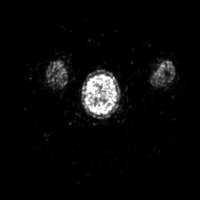

[Series 4: ct sk_thigh 5.0 b31f · axial · 5.0mm · 0.98mm/px · z∈[-1502,-534]mm · 5 of 243 slices shown]
[im 1/243]
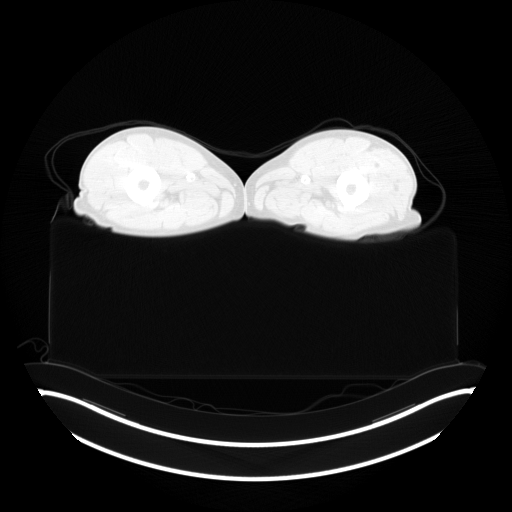
[im 61/243]
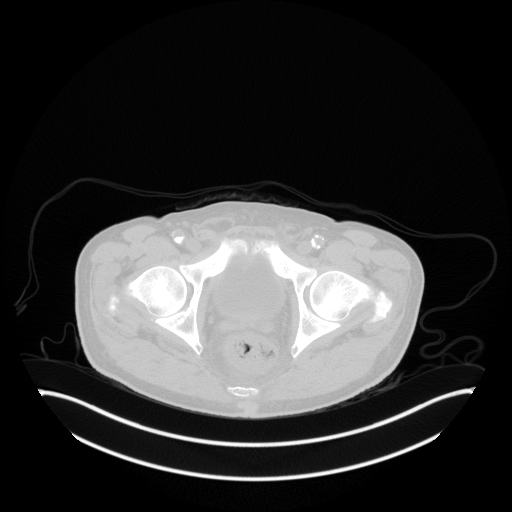
[im 122/243]
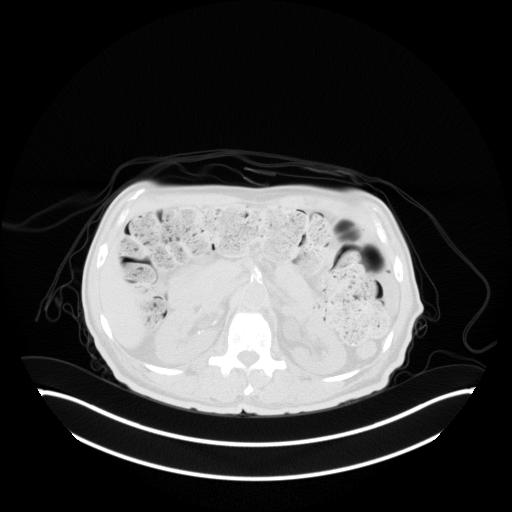
[im 182/243]
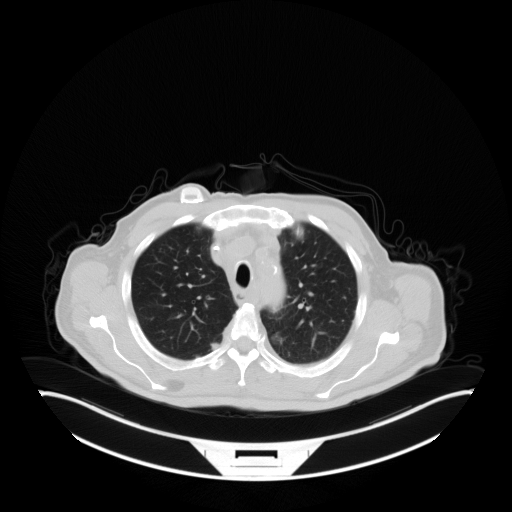
[im 243/243  brain]
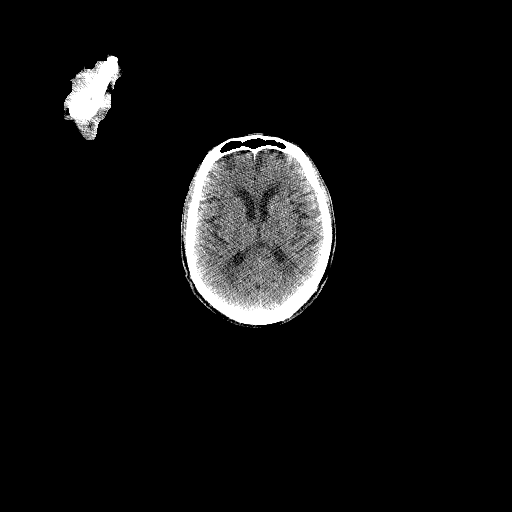

[Series 5: pet sk_thigh nac · axial · 5.0mm · 4.07mm/px · z∈[-1502,-534]mm · 6 of 243 slices shown]
[im 1/243]
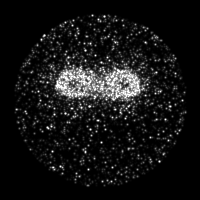
[im 49/243]
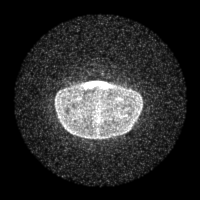
[im 97/243]
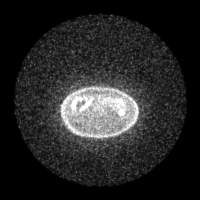
[im 146/243]
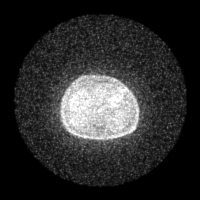
[im 194/243]
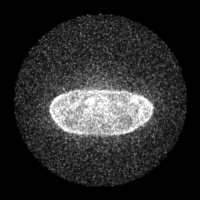
[im 243/243]
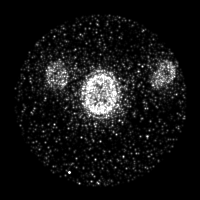

[Series 8: ct sk_thigh 5.0 b70f (id)_bone · axial · 5.0mm · 0.75mm/px · z∈[-976,-704]mm · 2 of 69 slices shown]
[im 1/69  bone]
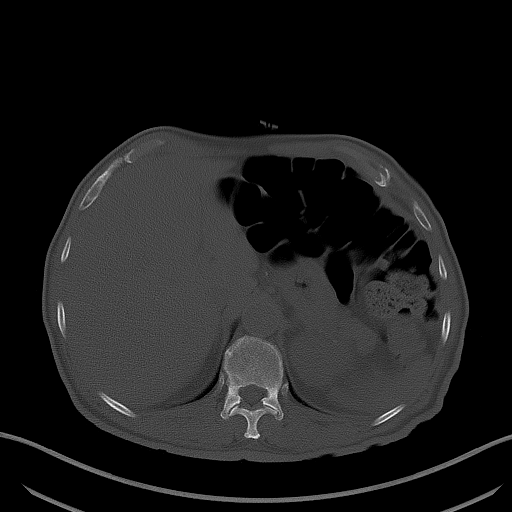
[im 69/69  bone]
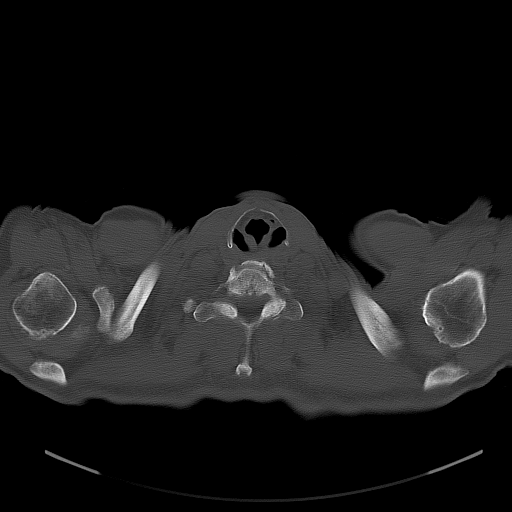

[Series 603: mip range 5 · coronal · 2.01mm/px · 1 of 32 slices shown]
[im 1/32]
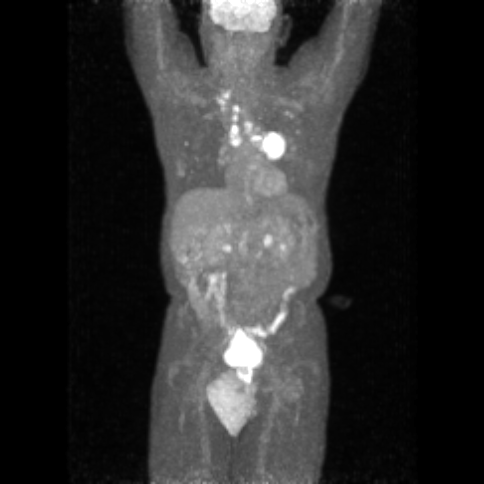

[Series 604: range-ct sk_thigh 5.0 (id)<alpha range> · 1 of 51 slices shown (1 of 2)]
[im 1/51]
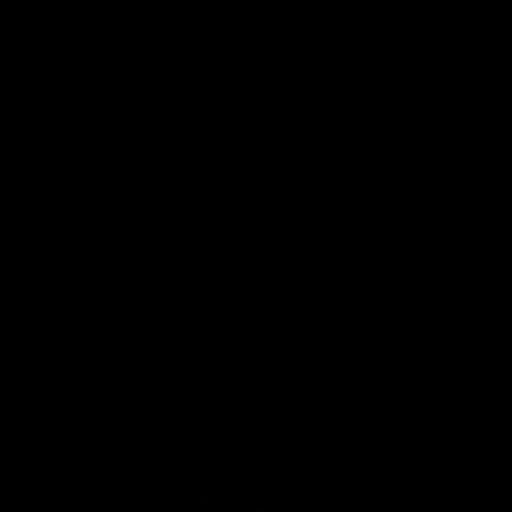

[Series 605: range-ct sk_thigh 5.0 (id)<alpha range> · 5 of 221 slices shown (2 of 2)]
[im 1/221]
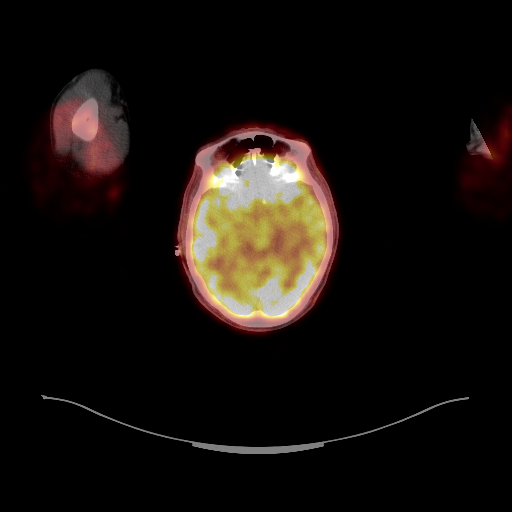
[im 56/221]
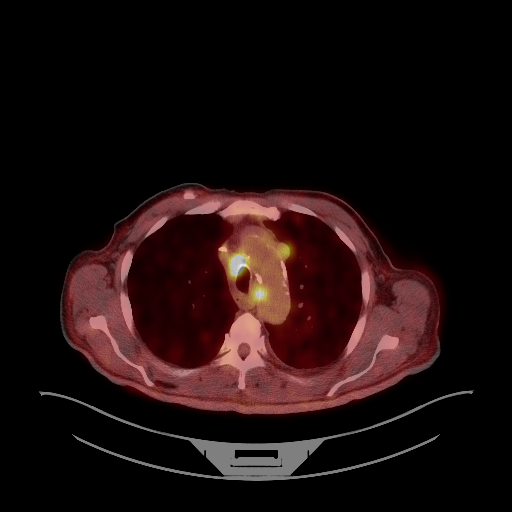
[im 111/221]
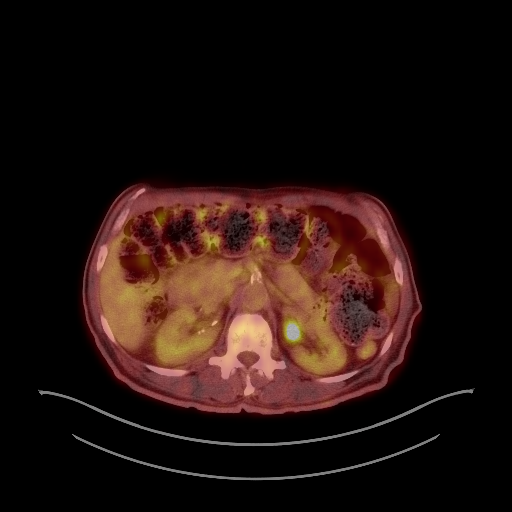
[im 166/221]
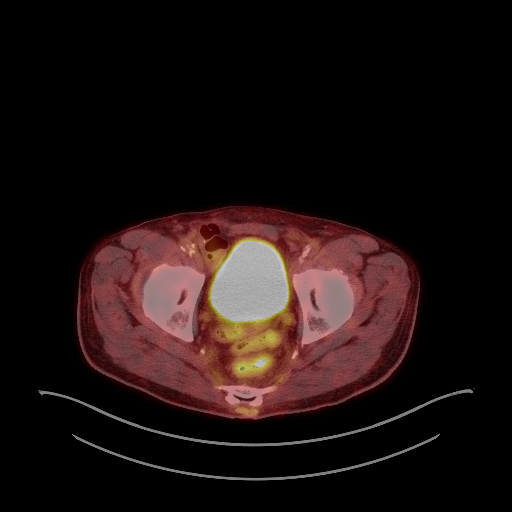
[im 221/221]
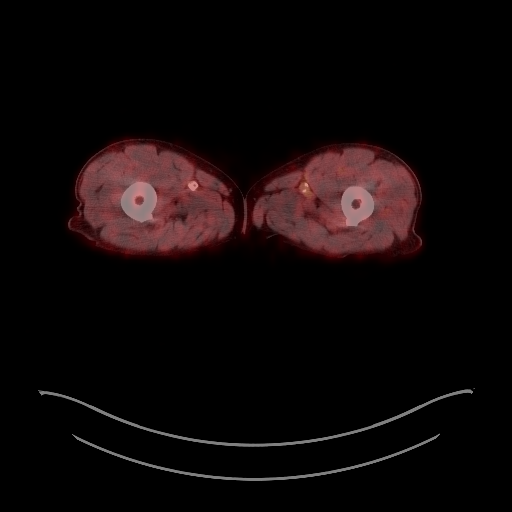

[25 of 25 positions shown; findings below may reference images not displayed]

FINDINGS: Mediastinal blood pool activity: SUV max

Liver activity: SUV max NA

NECK: No neck mass or neck adenopathy. There is an 8 mm right
supraclavicular node which is hypermetabolic with SUV max of 7.39.

Incidental CT findings: none

CHEST: The large partially necrotic left lower lobe lung mass is
hypermetabolic with SUV max of 11.89. Extensive hypermetabolic
mediastinal lymphadenopathy involving the prevascular lymph nodes,
AP window nodes and contralateral right paratracheal nodes. No
definite subcarinal disease. The left paratracheal node measures 17
mm and the SUV max is

Extensive pulmonary a metastatic disease with numerous
hypermetabolic pulmonary nodules throughout both lungs.

Incidental CT findings: none

ABDOMEN/PELVIS: No PET-CT findings suspicious for abdominal/pelvic
metastatic disease. No hypermetabolic liver or adrenal gland lesions
are identified. No abdominal/pelvic lymphadenopathy.

There is fairly significant circumferential FDG uptake in the rectum
and there is moderate diffuse rectal wall thickening. SUV max is
12.1. Findings could be due to proctitis but could not exclude the
possibility of tumor. Recommend correlation with rectal exam.

Incidental CT findings: Extensive brachytherapy seeds are noted in
the prostate gland suggesting prior prostate cancer. No pelvic or
retroperitoneal adenopathy.

SKELETON: No hypermetabolic bone lesions to suggest metastatic
disease.

Incidental CT findings: none
IMPRESSION: 1. Large partially necrotic left lower lobe mass is hypermetabolic
and consistent with known lung neoplasm.
2. Extensive mediastinal and hilar metastatic adenopathy and
extensive pulmonary metastatic nodules.
3. Hypermetabolic right supraclavicular lymph nodes.
4. No findings for metastatic disease involving the abdomen/pelvis
or bony structures.
5. Hypermetabolism in a diffusely thickened rectal wall. Could not
exclude rectal neoplasm.

## 2021-03-02 ENCOUNTER — Ambulatory Visit (HOSPITAL_COMMUNITY)
Admission: RE | Admit: 2021-03-02 | Discharge: 2021-03-02 | Disposition: A | Discharge status: Home / Self Care | Payer: Medicare HMO | Source: Ambulatory Visit | Attending: Physician Assistant | Admitting: Physician Assistant

## 2021-03-02 ENCOUNTER — Encounter (HOSPITAL_COMMUNITY): Payer: Self-pay

## 2021-03-02 ENCOUNTER — Other Ambulatory Visit: Payer: Self-pay

## 2021-03-02 DIAGNOSIS — J432 Centrilobular emphysema: Secondary | ICD-10-CM | POA: Diagnosis not present

## 2021-03-02 DIAGNOSIS — K760 Fatty (change of) liver, not elsewhere classified: Secondary | ICD-10-CM | POA: Diagnosis not present

## 2021-03-02 DIAGNOSIS — K4090 Unilateral inguinal hernia, without obstruction or gangrene, not specified as recurrent: Secondary | ICD-10-CM | POA: Diagnosis not present

## 2021-03-02 DIAGNOSIS — C3432 Malignant neoplasm of lower lobe, left bronchus or lung: Secondary | ICD-10-CM | POA: Insufficient documentation

## 2021-03-02 DIAGNOSIS — C3490 Malignant neoplasm of unspecified part of unspecified bronchus or lung: Secondary | ICD-10-CM | POA: Diagnosis not present

## 2021-03-02 DIAGNOSIS — J479 Bronchiectasis, uncomplicated: Secondary | ICD-10-CM | POA: Diagnosis not present

## 2021-03-02 DIAGNOSIS — J90 Pleural effusion, not elsewhere classified: Secondary | ICD-10-CM | POA: Diagnosis not present

## 2021-03-02 DIAGNOSIS — R918 Other nonspecific abnormal finding of lung field: Secondary | ICD-10-CM | POA: Diagnosis not present

## 2021-03-02 MED ORDER — IOHEXOL 300 MG/ML  SOLN
100.0000 mL | Freq: Once | INTRAMUSCULAR | Status: AC | PRN
  Administered 2021-03-02: 10:00:00 100 mL via INTRAVENOUS

## 2021-03-02 MED ORDER — SODIUM CHLORIDE (PF) 0.9 % IJ SOLN
INTRAMUSCULAR | Status: AC
  Filled 2021-03-02: qty 50

## 2021-03-05 ENCOUNTER — Other Ambulatory Visit: Payer: Self-pay

## 2021-03-05 ENCOUNTER — Emergency Department (HOSPITAL_COMMUNITY)
Admission: EM | Admit: 2021-03-05 | Discharge: 2021-03-05 | Disposition: A | Discharge status: Home / Self Care | Payer: Medicare HMO | Attending: Emergency Medicine | Admitting: Emergency Medicine

## 2021-03-05 DIAGNOSIS — Z85118 Personal history of other malignant neoplasm of bronchus and lung: Secondary | ICD-10-CM | POA: Insufficient documentation

## 2021-03-05 DIAGNOSIS — J45909 Unspecified asthma, uncomplicated: Secondary | ICD-10-CM | POA: Insufficient documentation

## 2021-03-05 DIAGNOSIS — I10 Essential (primary) hypertension: Secondary | ICD-10-CM | POA: Insufficient documentation

## 2021-03-05 DIAGNOSIS — J398 Other specified diseases of upper respiratory tract: Secondary | ICD-10-CM | POA: Diagnosis not present

## 2021-03-05 DIAGNOSIS — Z7982 Long term (current) use of aspirin: Secondary | ICD-10-CM | POA: Diagnosis not present

## 2021-03-05 DIAGNOSIS — Z79899 Other long term (current) drug therapy: Secondary | ICD-10-CM | POA: Insufficient documentation

## 2021-03-05 DIAGNOSIS — E119 Type 2 diabetes mellitus without complications: Secondary | ICD-10-CM | POA: Insufficient documentation

## 2021-03-05 DIAGNOSIS — R0682 Tachypnea, not elsewhere classified: Secondary | ICD-10-CM | POA: Insufficient documentation

## 2021-03-05 DIAGNOSIS — J386 Stenosis of larynx: Secondary | ICD-10-CM | POA: Diagnosis not present

## 2021-03-05 DIAGNOSIS — Z85038 Personal history of other malignant neoplasm of large intestine: Secondary | ICD-10-CM | POA: Insufficient documentation

## 2021-03-05 DIAGNOSIS — Z7984 Long term (current) use of oral hypoglycemic drugs: Secondary | ICD-10-CM | POA: Diagnosis not present

## 2021-03-05 DIAGNOSIS — J9503 Malfunction of tracheostomy stoma: Secondary | ICD-10-CM | POA: Diagnosis not present

## 2021-03-05 DIAGNOSIS — Z930 Tracheostomy status: Secondary | ICD-10-CM | POA: Diagnosis not present

## 2021-03-05 NOTE — ED Notes (Signed)
Pt having more difficulty breathing. Called RT.

## 2021-03-05 NOTE — Procedures (Signed)
Preop diagnosis: Tracheostomy status, glottic stenosis Postop diagnosis: same Procedure: Tracheostomy site dilation and replacement of tracheostomy tube Surgeon: Redmond Baseman Assist: None Anesth: None Compl: None Findings: Trach site narrowed with no tube in place.  Site serially dilated and #6 cuffless flexible Shiley replaced. Description:  The patient was identified in the Emergency Department.  The trach site was visualized.  Urethral sounds were then used to serially dilate the trach site.  A #4 cuffless flexible Shiley trach was first placed while waiting for a #6 to arrive.  Once the #6 tube arrived, it was then exchanged into the trach site.  He tolerated the procedure well.

## 2021-03-05 NOTE — ED Notes (Signed)
MD and RT at bedside. °

## 2021-03-05 NOTE — ED Notes (Signed)
Reviewed discharge instructions with patient and wife. Follow-up care reviewed. Patient and wife verbalized understanding. Patient A&Ox4, VSS, and ambulatory with steady gait upon discharge.

## 2021-03-05 NOTE — ED Provider Notes (Signed)
[facility] EMERGENCY DEPARTMENT Provider Note   CSN: [id number] Arrival date & time: 03/05/21  1556     History  Chief Complaint  Patient presents with   Tracheostomy Tube Change    Shaun Kennedy is a 81 y.o. male.  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   Denies any other concerns. No fever, no cough, no vomiting, no diarrhea.  No chest pain. No dyspnea until after arrival.    Past Medical History:  Diagnosis Date   Allergic rhinitis    Asthma    Carotid stenosis    Colon cancer ([place]) 2003   Diabetes mellitus ([place])    Diverticulosis    Dyslipidemia    ED (erectile dysfunction)    GERD (gastroesophageal reflux disease)    H/O degenerative disc disease    Hemorrhoids    HTN (hypertension)    as a child   Hyperlipidemia    Lung cancer ([place])    LVH (left ventricular hypertrophy)    on EKG   Mass of lower lobe of left lung    Mediastinal adenopathy    Smoker    former   Wears dentures    Wears dentures    Wears glasses    Wears glasses     Home Medications Prior to Admission medications   Medication Sig Start Date End Date Taking? Authorizing Provider  Alcohol Swabs (DROPSAFE ALCOHOL PREP) 70 % PADS Apply topically. 08/26/20   [provider]  amLODipine (NORVASC) 5 MG tablet Take 1 tablet (5 mg total) by mouth daily. 04/07/20   [name], MD  aspirin EC 81 MG tablet Take 81 mg by mouth daily after breakfast.     [provider]  bisacodyl (DULCOLAX) 5 MG EC tablet Take 10 mg by mouth daily as needed for moderate constipation.    [provider]  Blood Glucose Calibration (TRUE METRIX LEVEL 1) Low SOLN  08/26/20   [provider]  cholecalciferol (VITAMIN D) 25 MCG (1000 UNIT) tablet Take 1,000 Units by mouth daily.    [provider]   dronabinol (MARINOL) 2.5 MG capsule Take 1 capsule (2.5 mg total) by mouth 2 (two) times daily before a meal. 06/29/20   [name], MD  ferrous sulfate 325 (65 FE) MG EC tablet TAKE 1 TABLET (325 MG TOTAL) DAILY WITH BREAKFAST. 01/11/21   [name], MD  Lancet Devices (TRUEDRAW LANCING DEVICE) MISC Check sugars daily 08/31/20   [name], MD  lisinopril-hydrochlorothiazide (ZESTORETIC) 20-12.5 MG tablet Take 1 tablet by mouth daily. 08/17/20   [provider]  metFORMIN (GLUCOPHAGE-XR) 500 MG 24 hr tablet TAKE 1 TABLET EVERY DAY 08/17/20   [name], MD  Multiple Vitamin (MULTIVITAMIN WITH MINERALS) TABS tablet Take 1 tablet by mouth daily after breakfast.     [provider]  simvastatin (ZOCOR) 40 MG tablet Take 40 mg by mouth daily. 06/27/20   [provider]  solifenacin (VESICARE) 5 MG tablet TAKE 1 TABLET EVERY DAY 08/17/20   [name], MD  TRUE METRIX BLOOD GLUCOSE TEST test strip SMARTSIG:Via Meter 08/26/20   [provider]  TRUEplus Lancets 33G MISC 1 each by Does not apply route 2 (two) times daily. 10/27/20   [name], MD      Allergies    Patient has  no known allergies.    Review of Systems   Review of Systems  Physical Exam Updated Vital Signs BP (!) 151/92    Pulse (!) 108    Temp 98.5 F (36.9 C) (Oral)    Resp (!) 21    SpO2 97%  Physical Exam Vitals and nursing note reviewed.  Constitutional:      General: He is not in acute distress.    Appearance: He is well-developed. He is not diaphoretic.  HENT:     Head: Normocephalic and atraumatic.  Eyes:     Conjunctiva/sclera: Conjunctivae normal.  Neck:     Comments: Tracheostomy Cardiovascular:     Rate and Rhythm: Normal rate and regular rhythm.     Heart sounds: Normal heart sounds. No murmur heard.   No friction rub. No gallop.  Pulmonary:     Effort: Pulmonary effort is normal. Tachypnea present. No respiratory distress.     Breath sounds: Normal  breath sounds. No wheezing or rales.  Abdominal:     General: There is no distension.     Palpations: Abdomen is soft.     Tenderness: There is no abdominal tenderness. There is no guarding.  Musculoskeletal:     Cervical back: Normal range of motion.  Skin:    General: Skin is warm and dry.  Neurological:     Mental Status: He is alert and oriented to person, place, and time.    ED Results / Procedures / Treatments   Labs (all labs ordered are listed, but only abnormal results are displayed) Labs Reviewed - No data to display  EKG None  Radiology No results found.  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    Arrives in no distress, however I was called to another room and when I entered he is more uncomfortable.  RT attempted to place uncuffed 4 however unable to pass it with narrowing of tracheostomy.  Placed on oxygen, suction available, access established and ENT Dr. Redmond Baseman consulted.  He feels improved on O2, stable.  Dr. Redmond Baseman dilated the tracheostomy and placed uncuffed 4 and changed it to 6 when tube arrived. No other medical concerns today. Stable following tracheostomy tube change. Patient discharged in stable condition with understanding of reasons to return.         Final Clinical Impression(s) / ED Diagnoses Final diagnoses:  Trachea displaced    Rx / DC Orders ED Discharge Orders     None         Gareth Morgan, MD 03/06/21 1237

## 2021-03-05 NOTE — ED Triage Notes (Signed)
Pt trach tube displaced today, 7.25mm in hand in triage. Pt in NAD in triage, immediately roomed

## 2021-03-17 ENCOUNTER — Inpatient Hospital Stay: Payer: Medicare HMO

## 2021-03-17 ENCOUNTER — Other Ambulatory Visit: Payer: Self-pay

## 2021-03-17 ENCOUNTER — Inpatient Hospital Stay: Payer: Medicare HMO | Attending: Internal Medicine

## 2021-03-17 ENCOUNTER — Inpatient Hospital Stay (HOSPITAL_BASED_OUTPATIENT_CLINIC_OR_DEPARTMENT_OTHER): Payer: Medicare HMO | Admitting: Internal Medicine

## 2021-03-17 VITALS — BP 118/61 | HR 69 | Temp 97.0°F | Resp 18 | Ht 67.0 in | Wt 182.2 lb

## 2021-03-17 DIAGNOSIS — C3432 Malignant neoplasm of lower lobe, left bronchus or lung: Secondary | ICD-10-CM

## 2021-03-17 DIAGNOSIS — Z5112 Encounter for antineoplastic immunotherapy: Secondary | ICD-10-CM | POA: Diagnosis not present

## 2021-03-17 DIAGNOSIS — Z7982 Long term (current) use of aspirin: Secondary | ICD-10-CM | POA: Insufficient documentation

## 2021-03-17 DIAGNOSIS — Z7984 Long term (current) use of oral hypoglycemic drugs: Secondary | ICD-10-CM | POA: Diagnosis not present

## 2021-03-17 DIAGNOSIS — Z87891 Personal history of nicotine dependence: Secondary | ICD-10-CM | POA: Insufficient documentation

## 2021-03-17 DIAGNOSIS — Z923 Personal history of irradiation: Secondary | ICD-10-CM | POA: Insufficient documentation

## 2021-03-17 DIAGNOSIS — Z79899 Other long term (current) drug therapy: Secondary | ICD-10-CM | POA: Diagnosis not present

## 2021-03-17 DIAGNOSIS — Z95828 Presence of other vascular implants and grafts: Secondary | ICD-10-CM

## 2021-03-17 LAB — CMP (CANCER CENTER ONLY)
ALT: 12 U/L (ref 0–44)
AST: 14 U/L — ABNORMAL LOW (ref 15–41)
Albumin: 3.6 g/dL (ref 3.5–5.0)
Alkaline Phosphatase: 56 U/L (ref 38–126)
Anion gap: 5 (ref 5–15)
BUN: 20 mg/dL (ref 8–23)
CO2: 26 mmol/L (ref 22–32)
Calcium: 9 mg/dL (ref 8.9–10.3)
Chloride: 107 mmol/L (ref 98–111)
Creatinine: 1.16 mg/dL (ref 0.61–1.24)
GFR, Estimated: >60 mL/min (ref >60)
Glucose, Bld: 122 mg/dL — ABNORMAL HIGH (ref 70–99)
Potassium: 3.8 mmol/L (ref 3.5–5.1)
Sodium: 138 mmol/L (ref 135–145)
Total Bilirubin: 0.6 mg/dL (ref 0.3–1.2)
Total Protein: 7.6 g/dL (ref 6.5–8.1)

## 2021-03-17 LAB — CBC WITH DIFFERENTIAL (CANCER CENTER ONLY)
Abs Immature Granulocytes: 0.01 10*3/uL (ref 0.00–0.07)
Basophils Absolute: 0 10*3/uL (ref 0.0–0.1)
Basophils Relative: 0 %
Eosinophils Absolute: 0.6 10*3/uL — ABNORMAL HIGH (ref 0.0–0.5)
Eosinophils Relative: 8 %
HCT: 29.3 % — ABNORMAL LOW (ref 39.0–52.0)
Hemoglobin: 10.1 g/dL — ABNORMAL LOW (ref 13.0–17.0)
Immature Granulocytes: 0 %
Lymphocytes Relative: 17 %
Lymphs Abs: 1.3 10*3/uL (ref 0.7–4.0)
MCH: 31.7 pg (ref 26.0–34.0)
MCHC: 34.5 g/dL (ref 30.0–36.0)
MCV: 91.8 fL (ref 80.0–100.0)
Monocytes Absolute: 0.8 10*3/uL (ref 0.1–1.0)
Monocytes Relative: 10 %
Neutro Abs: 4.8 10*3/uL (ref 1.7–7.7)
Neutrophils Relative %: 65 %
Platelet Count: 225 10*3/uL (ref 150–400)
RBC: 3.19 MIL/uL — ABNORMAL LOW (ref 4.22–5.81)
RDW: 12.2 % (ref 11.5–15.5)
WBC Count: 7.5 10*3/uL (ref 4.0–10.5)
nRBC: 0 % (ref 0.0–0.2)

## 2021-03-17 LAB — TSH: TSH: 1.903 u[IU]/mL (ref 0.320–4.118)

## 2021-03-17 MED ORDER — SODIUM CHLORIDE 0.9% FLUSH
10.0000 mL | Freq: Once | INTRAVENOUS | Status: AC
  Administered 2021-03-17: 10 mL

## 2021-03-17 MED ORDER — HEPARIN SOD (PORK) LOCK FLUSH 100 UNIT/ML IV SOLN
500.0000 [IU] | Freq: Once | INTRAVENOUS | Status: AC | PRN
  Administered 2021-03-17: 500 [IU]

## 2021-03-17 MED ORDER — SODIUM CHLORIDE 0.9 % IV SOLN
360.0000 mg | Freq: Once | INTRAVENOUS | Status: AC
  Administered 2021-03-17: 360 mg via INTRAVENOUS
  Filled 2021-03-17: qty 24

## 2021-03-17 MED ORDER — SODIUM CHLORIDE 0.9 % IV SOLN
1.0000 mg/kg | Freq: Once | INTRAVENOUS | Status: AC
  Administered 2021-03-17: 80 mg via INTRAVENOUS
  Filled 2021-03-17: qty 16

## 2021-03-17 MED ORDER — SODIUM CHLORIDE 0.9 % IV SOLN: Freq: Once | INTRAVENOUS | Status: AC

## 2021-03-17 MED ORDER — DIPHENHYDRAMINE HCL 50 MG/ML IJ SOLN
25.0000 mg | Freq: Once | INTRAMUSCULAR | Status: AC
  Administered 2021-03-17: 25 mg via INTRAVENOUS
  Filled 2021-03-17: qty 1

## 2021-03-17 MED ORDER — FAMOTIDINE IN NACL 20-0.9 MG/50ML-% IV SOLN
20.0000 mg | Freq: Once | INTRAVENOUS | Status: AC
  Administered 2021-03-17: 20 mg via INTRAVENOUS
  Filled 2021-03-17: qty 50

## 2021-03-17 MED ORDER — SODIUM CHLORIDE 0.9% FLUSH
10.0000 mL | INTRAVENOUS | Status: DC | PRN
  Administered 2021-03-17: 10 mL

## 2021-03-17 NOTE — Progress Notes (Signed)
Shaun [phone]   [phone]  OFFICE PROGRESS NOTE  Shaun Kennedy, Shaun Kennedy [id number]  DIAGNOSIS: stage IV (T3, N2, M1 a) non-small cell Kennedy cancer, squamous cell carcinoma presented with obstructive left lower lobe Kennedy mass in addition to mediastinal lymphadenopathy as well as bilateral pulmonary nodules diagnosed in July 2021.   PDL1: 0%   PRIOR THERAPY: None   CURRENT THERAPY: 1) Palliative radiotherapy to the obstructive left lower lobe Kennedy mass under the care of Dr. Lisbeth Renshaw.  2)  systemic chemotherapy 2 cycles of chemotherapy with carboplatin for an AUC of 5 and paclitaxel 175 mg/m2 in addition to immunotherapy with nivolumab 360 mg every 3 weeks and ipilimumab 1 mg/kg IV every 6 weeks followed by maintenance nivolumab and ipilimumab.  He started the first treatment on 09/04/2019.  He is status post 13 cycles of the treatment.  INTERVAL HISTORY: Shaun Kennedy 81 y.o. male returns to the clinic today for follow-up visit.  The patient is feeling fine today with no concerning complaints except for occasional bloody discharge around the tracheostomy exit site.  He is followed by ENT for this issue.  He denied having any current chest pain, shortness of breath, cough or hemoptysis.  He denied having any fever or chills.  He has no nausea, vomiting, diarrhea or constipation.  He denied having any headache or visual changes.  He has no significant weight loss or night sweats.  He continues to tolerate his treatment with ipilimumab and nivolumab fairly well.  The patient had repeat CT scan of the chest, abdomen pelvis performed recently and he is here for evaluation and discussion of his scan results.  MEDICAL HISTORY: Past Medical History:  Diagnosis Date   Allergic rhinitis    Asthma    Carotid stenosis    Colon cancer ([name]) 2003   Diabetes mellitus ([name])    Diverticulosis    Dyslipidemia    ED (erectile  dysfunction)    GERD (gastroesophageal reflux disease)    H/O degenerative disc disease    Hemorrhoids    HTN (hypertension)    as a child   Hyperlipidemia    Kennedy cancer ([name])    LVH (left ventricular hypertrophy)    on EKG   Mass of lower lobe of left Kennedy    Mediastinal adenopathy    Smoker    former   Wears dentures    Wears dentures    Wears glasses    Wears glasses     ALLERGIES:  has No Known Allergies.  MEDICATIONS:  Current Outpatient Medications  Medication Sig Dispense Refill   Alcohol Swabs (DROPSAFE ALCOHOL PREP) 70 % PADS Apply topically.     amLODipine (NORVASC) 5 MG tablet Take 1 tablet (5 mg total) by mouth daily. 90 tablet 3   aspirin EC 81 MG tablet Take 81 mg by mouth daily after breakfast.      bisacodyl (DULCOLAX) 5 MG EC tablet Take 10 mg by mouth daily as needed for moderate constipation.     Blood Glucose Calibration (TRUE METRIX LEVEL 1) Low SOLN      cholecalciferol (VITAMIN D) 25 MCG (1000 UNIT) tablet Take 1,000 Units by mouth daily.     dronabinol (MARINOL) 2.5 MG capsule Take 1 capsule (2.5 mg total) by mouth 2 (two) times daily before a meal. 60 capsule 1   ferrous sulfate 325 (65 FE) MG EC tablet TAKE 1 TABLET (325  MG TOTAL) DAILY WITH BREAKFAST. 90 tablet 1   Lancet Devices (TRUEDRAW LANCING DEVICE) MISC Check sugars daily 90 each 0   lisinopril-hydrochlorothiazide (ZESTORETIC) 20-12.5 MG tablet Take 1 tablet by mouth daily.     metFORMIN (GLUCOPHAGE-XR) 500 MG 24 hr tablet TAKE 1 TABLET EVERY DAY 90 tablet 1   Multiple Vitamin (MULTIVITAMIN WITH MINERALS) TABS tablet Take 1 tablet by mouth daily after breakfast.      simvastatin (ZOCOR) 40 MG tablet Take 40 mg by mouth daily.     solifenacin (VESICARE) 5 MG tablet TAKE 1 TABLET EVERY DAY 90 tablet 1   TRUE METRIX BLOOD GLUCOSE TEST test strip SMARTSIG:Via Meter     TRUEplus Lancets 33G MISC 1 each by Does not apply route 2 (two) times daily. 200 each 4   No current facility-administered  medications for this visit.   Facility-Administered Medications Ordered in Other Visits  Medication Dose Route Frequency Provider Last Rate Last Admin   sodium chloride flush (NS) 0.9 % injection 10 mL  10 mL Intracatheter PRN Shaun Bears, MD   10 mL at 09/04/19 1556    SURGICAL HISTORY:  Past Surgical History:  Procedure Laterality Date   BRONCHIAL BIOPSY  08/20/2019   Procedure: BRONCHIAL BIOPSIES;  Surgeon: Collene Gobble, MD;  Location: [facility] ENDOSCOPY;  Service: Pulmonary;;   BRONCHIAL BRUSHINGS  08/20/2019   Procedure: BRONCHIAL BRUSHINGS;  Surgeon: Collene Gobble, MD;  Location: [facility] ENDOSCOPY;  Service: Pulmonary;;   BRONCHIAL NEEDLE ASPIRATION BIOPSY  08/20/2019   Procedure: BRONCHIAL NEEDLE ASPIRATION BIOPSIES;  Surgeon: Collene Gobble, MD;  Location: [place];  Service: Pulmonary;;   CARPAL TUNNEL RELEASE Right 01/09/2019   Procedure: CARPAL TUNNEL RELEASE;  Surgeon: Shaun Koyanagi, MD;  Location: [place];  Service: Orthopedics;  Laterality: Right;   CARPAL TUNNEL WITH CUBITAL TUNNEL Right 11/18/2020   Procedure: CARPAL TUNNEL WITH CUBITAL TUNNEL;  Surgeon: Shaun Crumb, MD;  Location: [place];  Service: Orthopedics;  Laterality: Right;   CATARACT EXTRACTION Right 2018   COLONOSCOPY  2007   Dr. Benson [place]   DIRECT LARYNGOSCOPY N/A 04/10/2020   Procedure: DIRECT LARYNGOSCOPY;  Surgeon: Shaun Quitter, MD;  Location: [place];  Service: ENT;  Laterality: N/A;   I & D EXTREMITY Right 04/02/2016   Procedure: IRRIGATION AND DEBRIDEMENT GREAT TOE;  Surgeon: Shaun Koyanagi, MD;  Location: [place];  Service: Orthopedics;  Laterality: Right;   IR IMAGING GUIDED PORT INSERTION  08/30/2019   MULTIPLE TOOTH EXTRACTIONS     RADIOACTIVE SEED IMPLANT  2003   TRACHEOSTOMY TUBE PLACEMENT N/A 04/10/2020   Procedure: TRACHEOSTOMY;  Surgeon: Shaun Quitter, MD;  Location: [place];  Service: ENT;  Laterality: N/A;   ULNAR TUNNEL RELEASE Right 01/09/2019   Procedure: RIGHT CUBITAL TUNNEL RELEASE  AND CARPAL TUNNEL RELEASE;  Surgeon: Shaun Koyanagi, MD;  Location: [place];  Service: Orthopedics;  Laterality: Right;   UPPER GASTROINTESTINAL ENDOSCOPY     VIDEO BRONCHOSCOPY N/A 12/18/2019   Procedure: VIDEO BRONCHOSCOPY WITHOUT FLUORO;  Surgeon: Collene Gobble, MD;  Location: [facility] ENDOSCOPY;  Service: Cardiopulmonary;  Laterality: N/A;   VIDEO BRONCHOSCOPY WITH ENDOBRONCHIAL ULTRASOUND N/A 08/20/2019   Procedure: VIDEO BRONCHOSCOPY WITH ENDOBRONCHIAL ULTRASOUND;  Surgeon: Collene Gobble, MD;  Location: [facility] ENDOSCOPY;  Service: Pulmonary;  Laterality: N/A;    REVIEW OF SYSTEMS:  Constitutional: negative Eyes: negative Ears, nose, mouth, throat, and face: negative Respiratory: negative Cardiovascular: negative Gastrointestinal: negative Genitourinary:negative Integument/breast: negative Hematologic/lymphatic: negative  Musculoskeletal:negative Neurological: negative Behavioral/Psych: negative Endocrine: negative Allergic/Immunologic: negative   PHYSICAL EXAMINATION: General appearance: alert, cooperative, and no distress Head: Normocephalic, without obvious abnormality, atraumatic Neck: no adenopathy, no JVD, supple, symmetrical, trachea midline, and thyroid not enlarged, symmetric, no tenderness/mass/nodules Lymph nodes: Cervical, supraclavicular, and axillary nodes normal. Resp: clear to auscultation bilaterally Back: symmetric, no curvature. ROM normal. No CVA tenderness. Cardio: regular rate and rhythm, S1, S2 normal, no murmur, click, rub or gallop GI: soft, non-tender; bowel sounds normal; no masses,  no organomegaly Extremities: extremities normal, atraumatic, no cyanosis or edema Neurologic: Alert and oriented X 3, normal strength and tone. Normal symmetric reflexes. Normal coordination and gait  ECOG PERFORMANCE STATUS: 1 - Symptomatic but completely ambulatory  Blood pressure 118/61, pulse 69, temperature (!) 97 F (36.1 C), temperature source Tympanic,  resp. rate 18, height '5\' 7"'  (1.702 m), weight 182 lb 3.2 oz (82.6 kg), SpO2 100 %.  LABORATORY DATA: Lab Results  Component Value Date   WBC 5.9 02/24/2021   HGB 10.5 (L) 02/24/2021   HCT 31.3 (L) 02/24/2021   MCV 93.2 02/24/2021   PLT 279 02/24/2021      Chemistry      Component Value Date/Time   NA 138 02/24/2021 0817   NA 132 (L) 08/14/2019 0918   K 3.9 02/24/2021 0817   CL 106 02/24/2021 0817   CO2 27 02/24/2021 0817   BUN 18 02/24/2021 0817   BUN 16 08/14/2019 0918   CREATININE 1.21 02/24/2021 0817   CREATININE 1.25 (H) 08/08/2016 0912      Component Value Date/Time   CALCIUM 9.1 02/24/2021 0817   ALKPHOS 56 02/24/2021 0817   AST 14 (L) 02/24/2021 0817   ALT 10 02/24/2021 0817   BILITOT 0.7 02/24/2021 0817       RADIOGRAPHIC STUDIES: CT Chest W Contrast  Result Date: 03/03/2021 CLINICAL DATA:  Non-small cell Kennedy cancer. EXAM: CT CHEST, ABDOMEN, AND PELVIS WITH CONTRAST TECHNIQUE: Multidetector CT imaging of the chest, abdomen and pelvis was performed following the standard protocol during bolus administration of intravenous contrast. RADIATION DOSE REDUCTION: This exam was performed according to the departmental dose-optimization program which includes automated exposure control, adjustment of the mA and/or kV according to patient size and/or use of iterative reconstruction technique. CONTRAST:  135m OMNIPAQUE IOHEXOL 300 MG/ML  SOLN COMPARISON:  11/17/2020. FINDINGS: CT CHEST FINDINGS Cardiovascular: Right IJ Port-A-Cath terminates in the SVC. Atherosclerotic calcification of the aorta and coronary arteries. Heart is enlarged. No pericardial effusion. Mediastinum/Nodes: No pathologically enlarged mediastinal, hilar or axillary lymph nodes. Esophagus is grossly unremarkable. Lungs/Pleura: Centrilobular and paraseptal emphysema. Post radiation parenchymal retraction, bronchiectasis and architectural distortion in the medial left hemithorax, as before. A few scattered  pulmonary nodules measure up to 8 mm in the peripheral left lower lobe (6/83), stable. Trace left pleural fluid. Areas of post treatment related left-sided bronchial narrowing. Tracheostomy in place. Musculoskeletal: No worrisome lytic or sclerotic lesions. CT ABDOMEN PELVIS FINDINGS Hepatobiliary: Liver is slightly decreased in attenuation diffusely. Subcentimeter low-attenuation lesion in the dome of the left hepatic lobe (2/43), similar and too small to characterize. Liver and gallbladder are otherwise unremarkable. No biliary ductal dilatation. Pancreas: Negative. Spleen: Negative. Adrenals/Urinary Tract: Adrenal glands are unremarkable. Probable renal sinus cysts on the right. Kidneys are otherwise unremarkable. Ureters are decompressed. Bladder is grossly unremarkable. Stomach/Bowel: Stomach, small bowel and appendix are unremarkable. Stool is seen in the majority of the colon, indicative of constipation. Vascular/Lymphatic: Atherosclerotic calcification of the aorta. No pathologically enlarged  lymph nodes. Reproductive: Brachytherapy seeds in the prostate. Other: Large right inguinal hernia contains large amount of fluid. Likely associated mild stranding anterior to the bladder into the right inguinal canal. Left inguinal hernia contains fat and a knuckle of unobstructed colon. Mesenteries and peritoneum are otherwise unremarkable. Musculoskeletal: Degenerative changes in the spine. No worrisome lytic or sclerotic lesions. IMPRESSION: 1. Stable post radiation changes in the left hemithorax and stable bilateral pulmonary nodules. No evidence of distant metastatic disease. 2. Large amount of fluid in the right inguinal canal with associated inflammatory stranding anterior to the bladder, extending into the right inguinal canal. Please correlate clinically. 3. Hepatic steatosis. 4. Aortic atherosclerosis (ICD10-I70.0). Coronary artery calcification. 5.  Emphysema (ICD10-J43.9). Electronically Signed   By:  Lorin Picket M.D.   On: 03/03/2021 12:00   CT Abdomen Pelvis W Contrast  Result Date: 03/03/2021 CLINICAL DATA:  Non-small cell Kennedy cancer. EXAM: CT CHEST, ABDOMEN, AND PELVIS WITH CONTRAST TECHNIQUE: Multidetector CT imaging of the chest, abdomen and pelvis was performed following the standard protocol during bolus administration of intravenous contrast. RADIATION DOSE REDUCTION: This exam was performed according to the departmental dose-optimization program which includes automated exposure control, adjustment of the mA and/or kV according to patient size and/or use of iterative reconstruction technique. CONTRAST:  177m OMNIPAQUE IOHEXOL 300 MG/ML  SOLN COMPARISON:  11/17/2020. FINDINGS: CT CHEST FINDINGS Cardiovascular: Right IJ Port-A-Cath terminates in the SVC. Atherosclerotic calcification of the aorta and coronary arteries. Heart is enlarged. No pericardial effusion. Mediastinum/Nodes: No pathologically enlarged mediastinal, hilar or axillary lymph nodes. Esophagus is grossly unremarkable. Lungs/Pleura: Centrilobular and paraseptal emphysema. Post radiation parenchymal retraction, bronchiectasis and architectural distortion in the medial left hemithorax, as before. A few scattered pulmonary nodules measure up to 8 mm in the peripheral left lower lobe (6/83), stable. Trace left pleural fluid. Areas of post treatment related left-sided bronchial narrowing. Tracheostomy in place. Musculoskeletal: No worrisome lytic or sclerotic lesions. CT ABDOMEN PELVIS FINDINGS Hepatobiliary: Liver is slightly decreased in attenuation diffusely. Subcentimeter low-attenuation lesion in the dome of the left hepatic lobe (2/43), similar and too small to characterize. Liver and gallbladder are otherwise unremarkable. No biliary ductal dilatation. Pancreas: Negative. Spleen: Negative. Adrenals/Urinary Tract: Adrenal glands are unremarkable. Probable renal sinus cysts on the right. Kidneys are otherwise unremarkable.  Ureters are decompressed. Bladder is grossly unremarkable. Stomach/Bowel: Stomach, small bowel and appendix are unremarkable. Stool is seen in the majority of the colon, indicative of constipation. Vascular/Lymphatic: Atherosclerotic calcification of the aorta. No pathologically enlarged lymph nodes. Reproductive: Brachytherapy seeds in the prostate. Other: Large right inguinal hernia contains large amount of fluid. Likely associated mild stranding anterior to the bladder into the right inguinal canal. Left inguinal hernia contains fat and a knuckle of unobstructed colon. Mesenteries and peritoneum are otherwise unremarkable. Musculoskeletal: Degenerative changes in the spine. No worrisome lytic or sclerotic lesions. IMPRESSION: 1. Stable post radiation changes in the left hemithorax and stable bilateral pulmonary nodules. No evidence of distant metastatic disease. 2. Large amount of fluid in the right inguinal canal with associated inflammatory stranding anterior to the bladder, extending into the right inguinal canal. Please correlate clinically. 3. Hepatic steatosis. 4. Aortic atherosclerosis (ICD10-I70.0). Coronary artery calcification. 5.  Emphysema (ICD10-J43.9). Electronically Signed   By: MLorin PicketM.D.   On: 03/03/2021 12:00    ASSESSMENT AND PLAN: This is a very pleasant 81years old [name] with stage IV non-small cell Kennedy cancer, squamous cell carcinoma with negative PD-L1 expression. The patient is currently undergoing  systemic chemotherapy with carboplatin and paclitaxel for 2 cycles in addition to immunotherapy with ipilimumab 1 mg/KG every 6 weeks and nivolumab 360 mg IV every 3 weeks status post 13 cycles. The patient continues to tolerate his treatment with ipilimumab and nivolumab fairly well. He had repeat CT scan of the chest, abdomen pelvis performed recently.  I personally and independently reviewed the scans and discussed the results with the patient today. His scan  showed no concerning findings for disease progression.  There was large amount of fluid in the right inguinal canal but the patient has no symptoms in this area. I recommended for him to continue his current treatment with ipilimumab and nivolumab and he is scheduled to start day 1 of cycle #14 today. The patient will come back for follow-up visit in 3 weeks for evaluation before starting day #22 of cycle 14. He was advised to call immediately if he has any other concerning symptoms in the interval. The patient voices understanding of current disease status and treatment options and is in agreement with the current care plan.  All questions were answered. The patient knows to call the clinic with any problems, questions or concerns. We can certainly see the patient much sooner if necessary.  Disclaimer: This note was dictated with voice recognition software. Similar sounding words can inadvertently be transcribed and may not be corrected upon review.

## 2021-03-17 NOTE — Progress Notes (Signed)
Ipilimumab (YERVOY) Patient Monitoring Assessment   Is the patient experiencing any of the following general symptoms?:  [] Difficulty performing normal activities [] Feeling sluggish or cold all the time [] Unusual weight gain [] Constant or unusual headaches [] Feeling dizzy or faint [] Changes in eyesight (blurry vision, double vision, or other vision problems) [] Changes in mood or behavior (ex: decreased sex drive, irritability, or forgetfulness) [] Starting new medications (ex: steroids, other medications that lower immune response) [x] Patient is not experiencing any of the general symptoms above.    Gastrointestinal  Patient is having 1 bowel movements each day.  Is this different from baseline? [] Yes [x] No Are your stools watery or do they have a foul smell? [] Yes [x] No Have you seen blood in your stools? [] Yes [x] No Are your stools dark, tarry, or sticky? [] Yes [x] No Are you having pain or tenderness in your belly? [] Yes [x] No  Skin Does your skin itch? [x] Yes [] No Do you have a rash? [] Yes [x] No Has your skin blistered and/or peeled? [] Yes [x] No Do you have sores in your mouth? [] Yes [x] No  Hepatic Has your urine been dark or tea colored? [] Yes [x] No Have you noticed that your skin or the whites of your eyes are turning yellow? [] Yes [x] No Are you bleeding or bruising more easily than normal? [] Yes [x] No Are you nauseous and/or vomiting? [] Yes [x] No Do you have pain on the right side of your stomach? [] Yes [x] No  Neurologic  Are you having unusual weakness of legs, arms, or face? [] Yes [x] No Are you having numbness or tingling in your hands or feet? [x] Yes [] No  Clyda Hurdle

## 2021-03-17 NOTE — Patient Instructions (Signed)
[facility] ONCOLOG[facility]   Discharge Instructions: Thank you for choosing [facility] to provide your oncology and hematology care.   If you have a lab appointment with the [facility], please go directly to the [facility] and check in at the registration area.   Wear comfortable clothing and clothing appropriate for easy access to any Portacath or PICC line.   We strive to give you quality time with your provider. You may need to reschedule your appointment if you arrive late (15 or more minutes).  Arriving late affects you and other patients whose appointments are after yours.  Also, if you miss three or more appointments without notifying the office, you may be dismissed from the clinic at the providers discretion.      For prescription refill requests, have your pharmacy contact our office and allow 72 hours for refills to be completed.    Today you received the following chemotherapy and/or immunotherapy agents: nivolumab and ipilimumab      To help prevent nausea and vomiting after your treatment, we encourage you to take your nausea medication as directed.  BELOW ARE SYMPTOMS THAT SHOULD BE REPORTED IMMEDIATELY: *FEVER GREATER THAN 100.4 F (38 C) OR HIGHER *CHILLS OR SWEATING *NAUSEA AND VOMITING THAT IS NOT CONTROLLED WITH YOUR NAUSEA MEDICATION *UNUSUAL SHORTNESS OF BREATH *UNUSUAL BRUISING OR BLEEDING *URINARY PROBLEMS (pain or burning when urinating, or frequent urination) *BOWEL PROBLEMS (unusual diarrhea, constipation, pain near the anus) TENDERNESS IN MOUTH AND THROAT WITH OR WITHOUT PRESENCE OF ULCERS (sore throat, sores in mouth, or a toothache) UNUSUAL RASH, SWELLING OR PAIN  UNUSUAL VAGINAL DISCHARGE OR ITCHING   Items with * indicate a potential emergency and should be followed up as soon as possible or go to the Emergency Department if any problems should occur.  Please show the CHEMOTHERAPY ALERT CARD or IMMUNOTHERAPY ALERT  CARD at check-in to the Emergency Department and triage nurse.  Should you have questions after your visit or need to cancel or reschedule your appointment, please contact [facility]  Dept: [phone]  and follow the prompts.  Office hours are 8:00 a.m. to 4:30 p.m. Monday - Friday. Please note that voicemails left after 4:00 p.m. may not be returned until the following business day.  We are closed weekends and major holidays. You have access to a nurse at all times for urgent questions. Please call the main number to the clinic Dept: [phone] and follow the prompts.   For any non-urgent questions, you may also contact your provider using MyChart. We now offer e-Visits for anyone 19 and older to request care online for non-urgent symptoms. For details visit [email or web address].   Also download the MyChart app! Go to the app store, search "MyChart", open the app, select [facility], and log in with your MyChart username and password.  Due to Covid, a mask is required upon entering the hospital/clinic. If you do not have a mask, one will be given to you upon arrival. For doctor visits, patients may have 1 support person aged 74 or older with them. For treatment visits, patients cannot have anyone with them due to current Covid guidelines and our immunocompromised population.

## 2021-03-18 ENCOUNTER — Other Ambulatory Visit: Payer: Self-pay | Admitting: Family Medicine

## 2021-03-18 DIAGNOSIS — C3432 Malignant neoplasm of lower lobe, left bronchus or lung: Secondary | ICD-10-CM

## 2021-03-18 MED ORDER — DRONABINOL 2.5 MG PO CAPS: 2.5000 mg | ORAL_CAPSULE | Freq: Two times a day (BID) | ORAL | 1 refills | Status: DC

## 2021-03-31 ENCOUNTER — Ambulatory Visit (INDEPENDENT_AMBULATORY_CARE_PROVIDER_SITE_OTHER): Payer: Medicare HMO | Admitting: Family Medicine

## 2021-03-31 VITALS — BP 110/60 | HR 88 | Temp 96.7°F | Wt 182.6 lb

## 2021-03-31 DIAGNOSIS — J309 Allergic rhinitis, unspecified: Secondary | ICD-10-CM | POA: Diagnosis not present

## 2021-03-31 DIAGNOSIS — E1159 Type 2 diabetes mellitus with other circulatory complications: Secondary | ICD-10-CM

## 2021-03-31 DIAGNOSIS — I700 Atherosclerosis of aorta: Secondary | ICD-10-CM | POA: Diagnosis not present

## 2021-03-31 DIAGNOSIS — H259 Unspecified age-related cataract: Secondary | ICD-10-CM

## 2021-03-31 DIAGNOSIS — E1121 Type 2 diabetes mellitus with diabetic nephropathy: Secondary | ICD-10-CM | POA: Diagnosis not present

## 2021-03-31 DIAGNOSIS — N3281 Overactive bladder: Secondary | ICD-10-CM

## 2021-03-31 DIAGNOSIS — G3184 Mild cognitive impairment, so stated: Secondary | ICD-10-CM

## 2021-03-31 DIAGNOSIS — E119 Type 2 diabetes mellitus without complications: Secondary | ICD-10-CM

## 2021-03-31 DIAGNOSIS — E785 Hyperlipidemia, unspecified: Secondary | ICD-10-CM

## 2021-03-31 DIAGNOSIS — E1169 Type 2 diabetes mellitus with other specified complication: Secondary | ICD-10-CM | POA: Diagnosis not present

## 2021-03-31 DIAGNOSIS — Z0000 Encounter for general adult medical examination without abnormal findings: Secondary | ICD-10-CM

## 2021-03-31 DIAGNOSIS — C3432 Malignant neoplasm of lower lobe, left bronchus or lung: Secondary | ICD-10-CM | POA: Diagnosis not present

## 2021-03-31 DIAGNOSIS — R630 Anorexia: Secondary | ICD-10-CM

## 2021-03-31 DIAGNOSIS — J386 Stenosis of larynx: Secondary | ICD-10-CM

## 2021-03-31 DIAGNOSIS — I152 Hypertension secondary to endocrine disorders: Secondary | ICD-10-CM

## 2021-03-31 LAB — POCT GLYCOSYLATED HEMOGLOBIN (HGB A1C): Hemoglobin A1C: 6.5 % — AB (ref 4.0–5.6)

## 2021-03-31 NOTE — Progress Notes (Signed)
Shaun Kennedy is a 81 y.o. male who presents for annual wellness visit and follow-up on chronic medical conditions.  He continues to be followed closely by Dr. Earlie Server.  He continues on lisinopril/HCTZ.  He is also taking metformin on a daily basis and does check his blood sugars daily.  Continues on Vesicare to help with his bladder issues.  He does take multivitamins as well as extra iron.  Continues on Marinol on twice a day dosing which seems to help maintain his weight although he does complain of decreased appetite.  He also notes that he has had more issues with his memory.  He is scheduled for another cataract surgery.  His allergies are under good control.  He does have a tracheostomy tube in place and seems to be handling this quite well.  He also has x-ray evidence of aortic atherosclerosis. He did try to play golf the other day for the first time in 2 years.  Immunizations and Health Maintenance Immunization History  Administered Date(s) Administered   Fluad Quad(high Dose 65+) 10/17/2018, 10/28/2019, 11/24/2020   Influenza, High Dose Seasonal PF 12/02/2015, 12/12/2016, 11/02/2017   PFIZER(Purple Top)SARS-COV-2 Vaccination 04/05/2019, 05/01/2019, 10/28/2019   Pfizer Covid-19 Vaccine Bivalent Booster 33yrs & up 11/24/2020   Pneumococcal Conjugate-13 06/25/2014   Pneumococcal Polysaccharide-23 06/03/2010   Tdap 12/06/2007, 10/31/2019   Zoster Recombinat (Shingrix) 02/25/2017, 04/26/2017   Zoster, Live 02/08/2008   Health Maintenance Due  Topic Date Due   HEMOGLOBIN A1C  01/29/2021    Last colonoscopy: 08/05/19 Dr.Pyrtle  Last PSA: N/A Dentist: q year Ophtho: Q year Exercise: walking 7 days a week  Other doctors caring for patient include: Dr. Hilarie Fredrickson GI            Dr. Julien Nordmann Oncology              Olean Ree PA Gen surgery              Dr. Posey Pronto neurology             Dr. Redmond Baseman ENT             Dr. Lamonte Sakai pulmonolgy Advanced Directives: Copy asked for  Depression screen:  See  questionnaire below.     Depression screen [facility] 2/9 03/31/2021 02/17/2021 04/27/2020 10/28/2019  Decreased Interest 0 0 0 0  Down, Depressed, Hopeless 0 0 0 0  PHQ - 2 Score 0 0 0 0  Some recent data might be hidden    Fall Screen: See Questionaire below.   Fall Risk  03/31/2021 02/17/2021 02/09/2021 01/14/2021 11/26/2020  Falls in the past year? 0 0 0 0 0  Number falls in past yr: 0 0 - - 0  Injury with Fall? 0 0 - - 0  Risk for fall due to : No Fall Risks Medication side effect - - Medication side effect  Follow up Falls evaluation completed Falls evaluation completed - - Falls evaluation completed    ADL screen:  See questionnaire below.  Functional Status Survey: Is the patient deaf or have difficulty hearing?: No Does the patient have difficulty seeing, even when wearing glasses/contacts?: No Does the patient have difficulty concentrating, remembering, or making decisions?: No Does the patient have difficulty walking or climbing stairs?: No Does the patient have difficulty dressing or bathing?: No Does the patient have difficulty doing errands alone such as visiting a doctor's office or shopping?: No   Review of Systems  Constitutional: -, -unexpected weight change, -anorexia, -fatigue Allergy: -sneezing, -itching, -congestion  Dermatology: denies changing moles, rash, lumps ENT: -runny nose, -ear pain, -sore throat,  Cardiology:  -chest pain, -palpitations, -orthopnea, Respiratory: -cough, -shortness of breath, -dyspnea on exertion, -wheezing,  Gastroenterology: -abdominal pain, -nausea, -vomiting, -diarrhea, -constipation, -dysphagia Hematology: -bleeding or bruising problems Musculoskeletal: -arthralgias, -myalgias, -joint swelling, -back pain, - Ophthalmology: -vision changes,  Urology: -dysuria, -difficulty urinating,  -urinary frequency, -urgency, incontinence Neurology: -, -numbness, , -memory loss, -falls, -dizziness    PHYSICAL EXAM: General Appearance: Alert,  cooperative, no distress, appears stated age Head: Normocephalic, without obvious abnormality, atraumatic Eyes: PERRL, conjunctiva/corneas clear, EOM's intact, f Ears: Normal TM's and external ear canals Nose: Nares normal, mucosa normal, no drainage or sinus   tenderness Throat: Lips, mucosa, and tongue normal; teeth and gums normal Neck: Supple, no lymphadenopathy, thyroid:no enlargement/tenderness/nodules; no carotid bruit or JVD.  Tracheostomy tube in place. Lungs: Clear to auscultation bilaterally without wheezes, rales or ronchi; respirations unlabored Heart: Regular rate and rhythm, S1 and S2 normal, no murmur, rub or gallop Abdomen: Soft, non-tender, nondistended, normoactive bowel sounds, no masses, no hepatosplenomegaly Skin: Skin color, texture, turgor normal, no rashes or lesions Lymph nodes: Cervical, supraclavicular, and axillary nodes normal Neurologic: CNII-XII intact, normal strength, sensation and gait; reflexes 2+ and symmetric throughout   Psych: Normal mood, affect, hygiene and grooming Hemoglobin A1c is 6.5 ASSESSMENT/PLAN: Diabetes mellitus type II, non insulin dependent ([facility]) - Plan: POCT glycosylated hemoglobin (Hb A1C)  Hypertension associated with diabetes (HCC)  Hyperlipidemia associated with type 2 diabetes mellitus ([facility]) - Plan: Lipid panel  Allergic rhinitis, mild  OAB (overactive bladder)  Diabetic nephropathy associated with type 2 diabetes mellitus (HCC)  Atherosclerosis of aorta (HCC)  Malignant neoplasm of lower lobe of left lung (HCC)  Decreased appetite  Glottic stenosis  Mild cognitive impairment  Senile cataract of left eye, unspecified age-related cataract type Seems to doing fairly well on the present circumstances.  He is to continue on all of his medications including the Marinol since he has maintained his weight.  Briefly discussed the cognitive issues with him and at this point do not think any further intervention is needed.     Immunization recommendations discussed.  Colonoscopy recommendations reviewed.   Medicare Attestation I have personally reviewed: The patient's medical and social history Their use of alcohol, tobacco or illicit drugs Their current medications and supplements The patient's functional ability including ADLs,fall risks, home safety risks, cognitive, and hearing and visual impairment Diet and physical activities Evidence for depression or mood disorders  The patient's weight, height, and BMI have been recorded in the chart.  I have made referrals, counseling, and provided education to the patient based on review of the above and I have provided the patient with a written personalized care plan for preventive services.     Jill Alexanders, MD   03/31/2021

## 2021-04-01 ENCOUNTER — Ambulatory Visit: Payer: Medicare HMO | Admitting: Family Medicine

## 2021-04-01 LAB — LIPID PANEL
Chol/HDL Ratio: 2 ratio (ref 0.0–5.0)
Cholesterol, Total: 129 mg/dL (ref 100–199)
HDL: 66 mg/dL (ref >39)
LDL Chol Calc (NIH): 45 mg/dL (ref 0–99)
Triglycerides: 95 mg/dL (ref 0–149)
VLDL Cholesterol Cal: 18 mg/dL (ref 5–40)

## 2021-04-07 ENCOUNTER — Inpatient Hospital Stay: Payer: Medicare HMO | Attending: Internal Medicine

## 2021-04-07 ENCOUNTER — Inpatient Hospital Stay: Payer: Medicare HMO

## 2021-04-07 ENCOUNTER — Other Ambulatory Visit: Payer: Self-pay

## 2021-04-07 ENCOUNTER — Encounter: Payer: Self-pay | Admitting: Internal Medicine

## 2021-04-07 ENCOUNTER — Inpatient Hospital Stay: Payer: Medicare HMO | Admitting: Internal Medicine

## 2021-04-07 VITALS — BP 124/59 | HR 66 | Temp 96.9°F | Resp 18 | Ht 67.0 in | Wt 182.5 lb

## 2021-04-07 DIAGNOSIS — Z87891 Personal history of nicotine dependence: Secondary | ICD-10-CM | POA: Diagnosis not present

## 2021-04-07 DIAGNOSIS — R630 Anorexia: Secondary | ICD-10-CM | POA: Insufficient documentation

## 2021-04-07 DIAGNOSIS — Z95828 Presence of other vascular implants and grafts: Secondary | ICD-10-CM

## 2021-04-07 DIAGNOSIS — Z7984 Long term (current) use of oral hypoglycemic drugs: Secondary | ICD-10-CM | POA: Diagnosis not present

## 2021-04-07 DIAGNOSIS — Z79899 Other long term (current) drug therapy: Secondary | ICD-10-CM | POA: Diagnosis not present

## 2021-04-07 DIAGNOSIS — C3432 Malignant neoplasm of lower lobe, left bronchus or lung: Secondary | ICD-10-CM

## 2021-04-07 DIAGNOSIS — Z5112 Encounter for antineoplastic immunotherapy: Secondary | ICD-10-CM | POA: Diagnosis not present

## 2021-04-07 DIAGNOSIS — Z85038 Personal history of other malignant neoplasm of large intestine: Secondary | ICD-10-CM | POA: Insufficient documentation

## 2021-04-07 LAB — CBC WITH DIFFERENTIAL (CANCER CENTER ONLY)
Abs Immature Granulocytes: 0.01 10*3/uL (ref 0.00–0.07)
Basophils Absolute: 0 10*3/uL (ref 0.0–0.1)
Basophils Relative: 1 %
Eosinophils Absolute: 0.6 10*3/uL — ABNORMAL HIGH (ref 0.0–0.5)
Eosinophils Relative: 10 %
HCT: 31.6 % — ABNORMAL LOW (ref 39.0–52.0)
Hemoglobin: 10.2 g/dL — ABNORMAL LOW (ref 13.0–17.0)
Immature Granulocytes: 0 %
Lymphocytes Relative: 21 %
Lymphs Abs: 1.1 10*3/uL (ref 0.7–4.0)
MCH: 30.6 pg (ref 26.0–34.0)
MCHC: 32.3 g/dL (ref 30.0–36.0)
MCV: 94.9 fL (ref 80.0–100.0)
Monocytes Absolute: 0.6 10*3/uL (ref 0.1–1.0)
Monocytes Relative: 10 %
Neutro Abs: 3.2 10*3/uL (ref 1.7–7.7)
Neutrophils Relative %: 58 %
Platelet Count: 302 10*3/uL (ref 150–400)
RBC: 3.33 MIL/uL — ABNORMAL LOW (ref 4.22–5.81)
RDW: 12.4 % (ref 11.5–15.5)
WBC Count: 5.5 10*3/uL (ref 4.0–10.5)
nRBC: 0 % (ref 0.0–0.2)

## 2021-04-07 LAB — CMP (CANCER CENTER ONLY)
ALT: 12 U/L (ref 0–44)
AST: 14 U/L — ABNORMAL LOW (ref 15–41)
Albumin: 3.7 g/dL (ref 3.5–5.0)
Alkaline Phosphatase: 62 U/L (ref 38–126)
Anion gap: 4 — ABNORMAL LOW (ref 5–15)
BUN: 26 mg/dL — ABNORMAL HIGH (ref 8–23)
CO2: 28 mmol/L (ref 22–32)
Calcium: 9.2 mg/dL (ref 8.9–10.3)
Chloride: 107 mmol/L (ref 98–111)
Creatinine: 1.34 mg/dL — ABNORMAL HIGH (ref 0.61–1.24)
GFR, Estimated: 53 mL/min — ABNORMAL LOW (ref >60)
Glucose, Bld: 108 mg/dL — ABNORMAL HIGH (ref 70–99)
Potassium: 4.3 mmol/L (ref 3.5–5.1)
Sodium: 139 mmol/L (ref 135–145)
Total Bilirubin: 0.6 mg/dL (ref 0.3–1.2)
Total Protein: 8 g/dL (ref 6.5–8.1)

## 2021-04-07 LAB — TSH: TSH: 0.714 u[IU]/mL (ref 0.320–4.118)

## 2021-04-07 MED ORDER — SODIUM CHLORIDE 0.9 % IV SOLN
360.0000 mg | Freq: Once | INTRAVENOUS | Status: AC
  Administered 2021-04-07: 360 mg via INTRAVENOUS
  Filled 2021-04-07: qty 24

## 2021-04-07 MED ORDER — HEPARIN SOD (PORK) LOCK FLUSH 100 UNIT/ML IV SOLN
500.0000 [IU] | Freq: Once | INTRAVENOUS | Status: AC | PRN
  Administered 2021-04-07: 500 [IU]

## 2021-04-07 MED ORDER — ALTEPLASE 2 MG IJ SOLR
2.0000 mg | Freq: Once | INTRAMUSCULAR | Status: AC
  Administered 2021-04-07: 2 mg
  Filled 2021-04-07: qty 2

## 2021-04-07 MED ORDER — SODIUM CHLORIDE 0.9% FLUSH
10.0000 mL | INTRAVENOUS | Status: DC | PRN
  Administered 2021-04-07: 10 mL

## 2021-04-07 MED ORDER — SODIUM CHLORIDE 0.9% FLUSH
10.0000 mL | Freq: Once | INTRAVENOUS | Status: AC
  Administered 2021-04-07: 10 mL

## 2021-04-07 MED ORDER — SODIUM CHLORIDE 0.9 % IV SOLN: Freq: Once | INTRAVENOUS | Status: AC

## 2021-04-07 NOTE — Progress Notes (Addendum)
Per Dr. Julien Nordmann , it is ok to treat pt today with ? carboplatin and paclitaxel and ipilimumab and nivolumab  and creatinine of 1.34. ?

## 2021-04-07 NOTE — Progress Notes (Signed)
[facility] [phone]   [phone]  OFFICE PROGRESS NOTE  Shaun Kennedy, Shaun Kennedy [id number]  DIAGNOSIS: stage IV (T3, N2, M1 a) non-small cell Kennedy cancer, squamous cell carcinoma presented with obstructive left lower lobe Kennedy mass in addition to mediastinal lymphadenopathy as well as bilateral pulmonary nodules diagnosed in July 2021.   PDL1: 0%   PRIOR THERAPY: None   CURRENT THERAPY: 1) Palliative radiotherapy to the obstructive left lower lobe Kennedy mass under the care of Dr. Lisbeth Renshaw.  2)  systemic chemotherapy 2 cycles of chemotherapy with carboplatin for an AUC of 5 and paclitaxel 175 mg/m2 in addition to immunotherapy with nivolumab 360 mg every 3 weeks and ipilimumab 1 mg/kg IV every 6 weeks followed by maintenance nivolumab and ipilimumab.  He started the first treatment on 09/04/2019.  He is status post 13 cycles of the treatment.  INTERVAL HISTORY: Shaun Kennedy 81 y.o. male returns to the clinic today for follow-up visit.  The patient is feeling fine today with no concerning complaints except for lack of appetite.  He lost few pounds recently.  He denied having any current chest pain, shortness of breath except with exertion with no cough or hemoptysis.  He has no nausea, vomiting, diarrhea or constipation.  He has no headache or visual changes.  He denied having any fever or chills.  He continues to tolerate his treatment with immunotherapy fairly well.  The patient is here today for evaluation before starting day #22 of cycle #14.   MEDICAL HISTORY: Past Medical History:  Diagnosis Date   Allergic rhinitis    Asthma    Carotid stenosis    Colon cancer ([facility]) 2003   Diabetes mellitus ([facility])    Diverticulosis    Dyslipidemia    ED (erectile dysfunction)    GERD (gastroesophageal reflux disease)    H/O degenerative disc disease    Hemorrhoids    HTN (hypertension)    as a child   Hyperlipidemia     Kennedy cancer ([facility])    LVH (left ventricular hypertrophy)    on EKG   Mass of lower lobe of left Kennedy    Mediastinal adenopathy    Smoker    former   Wears dentures    Wears dentures    Wears glasses    Wears glasses     ALLERGIES:  has No Known Allergies.  MEDICATIONS:  Current Outpatient Medications  Medication Sig Dispense Refill   Alcohol Swabs (DROPSAFE ALCOHOL PREP) 70 % PADS Apply topically.     amLODipine (NORVASC) 5 MG tablet Take 1 tablet (5 mg total) by mouth daily. 90 tablet 3   aspirin EC 81 MG tablet Take 81 mg by mouth daily after breakfast.      Blood Glucose Calibration (TRUE METRIX LEVEL 1) Low SOLN      cholecalciferol (VITAMIN D) 25 MCG (1000 UNIT) tablet Take 1,000 Units by mouth daily.     dronabinol (MARINOL) 2.5 MG capsule Take 1 capsule (2.5 mg total) by mouth 2 (two) times daily before a meal. 60 capsule 1   ferrous sulfate 325 (65 FE) MG EC tablet TAKE 1 TABLET (325 MG TOTAL) DAILY WITH BREAKFAST. 90 tablet 1   Lancet Devices (TRUEDRAW LANCING DEVICE) MISC Check sugars daily 90 each 0   lisinopril-hydrochlorothiazide (ZESTORETIC) 20-12.5 MG tablet Take 1 tablet by mouth daily.     metFORMIN (GLUCOPHAGE-XR) 500 MG 24 hr tablet TAKE  1 TABLET EVERY DAY 90 tablet 1   Multiple Vitamin (MULTIVITAMIN WITH MINERALS) TABS tablet Take 1 tablet by mouth daily after breakfast.      simvastatin (ZOCOR) 40 MG tablet Take 40 mg by mouth daily.     solifenacin (VESICARE) 5 MG tablet TAKE 1 TABLET EVERY DAY 90 tablet 1   TRUE METRIX BLOOD GLUCOSE TEST test strip SMARTSIG:Via Meter     TRUEplus Lancets 33G MISC 1 each by Does not apply route 2 (two) times daily. 200 each 4   bisacodyl (DULCOLAX) 5 MG EC tablet Take 10 mg by mouth daily as needed for moderate constipation.     No current facility-administered medications for this visit.   Facility-Administered Medications Ordered in Other Visits  Medication Dose Route Frequency Provider Last Rate Last Admin   sodium  chloride flush (NS) 0.9 % injection 10 mL  10 mL Intracatheter PRN Curt Bears, MD   10 mL at 09/04/19 1556    SURGICAL HISTORY:  Past Surgical History:  Procedure Laterality Date   BRONCHIAL BIOPSY  08/20/2019   Procedure: BRONCHIAL BIOPSIES;  Surgeon: Collene Gobble, MD;  Location: [facility] ENDOSCOPY;  Service: Pulmonary;;   BRONCHIAL BRUSHINGS  08/20/2019   Procedure: BRONCHIAL BRUSHINGS;  Surgeon: Collene Gobble, MD;  Location: [facility] ENDOSCOPY;  Service: Pulmonary;;   BRONCHIAL NEEDLE ASPIRATION BIOPSY  08/20/2019   Procedure: BRONCHIAL NEEDLE ASPIRATION BIOPSIES;  Surgeon: Collene Gobble, MD;  Location: [facility];  Service: Pulmonary;;   CARPAL TUNNEL RELEASE Right 01/09/2019   Procedure: CARPAL TUNNEL RELEASE;  Surgeon: Leandrew Koyanagi, MD;  Location: [facility];  Service: Orthopedics;  Laterality: Right;   CARPAL TUNNEL WITH CUBITAL TUNNEL Right 11/18/2020   Procedure: CARPAL TUNNEL WITH CUBITAL TUNNEL;  Surgeon: Charlotte Crumb, MD;  Location: [facility];  Service: Orthopedics;  Laterality: Right;   CATARACT EXTRACTION Right 2018   COLONOSCOPY  2007   Dr. Benson Norway   DIRECT LARYNGOSCOPY N/A 04/10/2020   Procedure: DIRECT LARYNGOSCOPY;  Surgeon: Melida Quitter, MD;  Location: [facility];  Service: ENT;  Laterality: N/A;   I & D EXTREMITY Right 04/02/2016   Procedure: IRRIGATION AND DEBRIDEMENT GREAT TOE;  Surgeon: Leandrew Koyanagi, MD;  Location: [facility];  Service: Orthopedics;  Laterality: Right;   IR IMAGING GUIDED PORT INSERTION  08/30/2019   MULTIPLE TOOTH EXTRACTIONS     RADIOACTIVE SEED IMPLANT  2003   TRACHEOSTOMY TUBE PLACEMENT N/A 04/10/2020   Procedure: TRACHEOSTOMY;  Surgeon: Melida Quitter, MD;  Location: [facility];  Service: ENT;  Laterality: N/A;   ULNAR TUNNEL RELEASE Right 01/09/2019   Procedure: RIGHT CUBITAL TUNNEL RELEASE AND CARPAL TUNNEL RELEASE;  Surgeon: Leandrew Koyanagi, MD;  Location: [facility];  Service: Orthopedics;  Laterality: Right;   UPPER GASTROINTESTINAL  ENDOSCOPY     VIDEO BRONCHOSCOPY N/A 12/18/2019   Procedure: VIDEO BRONCHOSCOPY WITHOUT FLUORO;  Surgeon: Collene Gobble, MD;  Location: [facility] ENDOSCOPY;  Service: Cardiopulmonary;  Laterality: N/A;   VIDEO BRONCHOSCOPY WITH ENDOBRONCHIAL ULTRASOUND N/A 08/20/2019   Procedure: VIDEO BRONCHOSCOPY WITH ENDOBRONCHIAL ULTRASOUND;  Surgeon: Collene Gobble, MD;  Location[facility] ENDOSCOPY;  Service: Pulmonary;  Laterality: N/A;    REVIEW OF SYSTEMS:  A comprehensive review of systems was negative except for: Constitutional: positive for anorexia and weight loss Respiratory: positive for dyspnea on exertion   PHYSICAL EXAMINATION: General appearance: alert, cooperative, and no distress Head: Normocephalic, without obvious abnormality, atraumatic Neck: no adenopathy, no JVD, supple, symmetrical, trachea midline,  and thyroid not enlarged, symmetric, no tenderness/mass/nodules Lymph nodes: Cervical, supraclavicular, and axillary nodes normal. Resp: clear to auscultation bilaterally Back: symmetric, no curvature. ROM normal. No CVA tenderness. Cardio: regular rate and rhythm, S1, S2 normal, no murmur, click, rub or gallop GI: soft, non-tender; bowel sounds normal; no masses,  no organomegaly Extremities: extremities normal, atraumatic, no cyanosis or edema  ECOG PERFORMANCE STATUS: 1 - Symptomatic but completely ambulatory  Blood pressure (!) 124/59, pulse 66, temperature (!) 96.9 F (36.1 C), temperature source Tympanic, resp. rate 18, height '5\' 7"'  (1.702 m), weight 182 lb 8 oz (82.8 kg), SpO2 100 %.  LABORATORY DATA: Lab Results  Component Value Date   WBC 5.5 04/07/2021   HGB 10.2 (L) 04/07/2021   HCT 31.6 (L) 04/07/2021   MCV 94.9 04/07/2021   PLT 302 04/07/2021      Chemistry      Component Value Date/Time   NA 139 04/07/2021 0840   NA 132 (L) 08/14/2019 0918   K 4.3 04/07/2021 0840   CL 107 04/07/2021 0840   CO2 28 04/07/2021 0840   BUN 26 (H) 04/07/2021 0840   BUN 16 08/14/2019 0918    CREATININE 1.34 (H) 04/07/2021 0840   CREATININE 1.25 (H) 08/08/2016 0912      Component Value Date/Time   CALCIUM 9.2 04/07/2021 0840   ALKPHOS 62 04/07/2021 0840   AST 14 (L) 04/07/2021 0840   ALT 12 04/07/2021 0840   BILITOT 0.6 04/07/2021 0840       RADIOGRAPHIC STUDIES: No results found.  ASSESSMENT AND PLAN: This is a very pleasant 81 years old [name] with stage IV non-small cell Kennedy cancer, squamous cell carcinoma with negative PD-L1 expression. The patient is currently undergoing systemic chemotherapy with carboplatin and paclitaxel for 2 cycles in addition to immunotherapy with ipilimumab 1 mg/KG every 6 weeks and nivolumab 360 mg IV every 3 weeks status post 13 cycles. The patient continues to tolerate his treatment fairly well with no concerning adverse effects. I recommended for him to proceed with day #22 of cycle #14 today as planned. I will see him back for follow-up visit in 3 weeks for evaluation before starting day #1 of cycle #15. The patient was advised to call immediately if he has any other concerning symptoms in the interval. The patient voices understanding of current disease status and treatment options and is in agreement with the current care plan.  All questions were answered. The patient knows to call the clinic with any problems, questions or concerns. We can certainly see the patient much sooner if necessary.  Disclaimer: This note was dictated with voice recognition software. Similar sounding words can inadvertently be transcribed and may not be corrected upon review.

## 2021-04-07 NOTE — Patient Instructions (Signed)
[facility]  Discharge Instructions: ?Thank you for choosing [facility] to provide your oncology and hematology care.  ? ?If you have a lab appointment with the Cancer Center, please go directly to the Cancer Center and check in at the registration area. ?  ?Wear comfortable clothing and clothing appropriate for easy access to any Portacath or PICC line.  ? ?We strive to give you quality time with your provider. You may need to reschedule your appointment if you arrive late (15 or more minutes).  Arriving late affects you and other patients whose appointments are after yours.  Also, if you miss three or more appointments without notifying the office, you may be dismissed from the clinic at the provider?s discretion.    ?  ?For prescription refill requests, have your pharmacy contact our office and allow 72 hours for refills to be completed.   ? ?Today you received the following chemotherapy and/or immunotherapy agents Opdivo    ?  ?To help prevent nausea and vomiting after your treatment, we encourage you to take your nausea medication as directed. ? ?BELOW ARE SYMPTOMS THAT SHOULD BE REPORTED IMMEDIATELY: ?*FEVER GREATER THAN 100.4 F (38 ?C) OR HIGHER ?*CHILLS OR SWEATING ?*NAUSEA AND VOMITING THAT IS NOT CONTROLLED WITH YOUR NAUSEA MEDICATION ?*UNUSUAL SHORTNESS OF BREATH ?*UNUSUAL BRUISING OR BLEEDING ?*URINARY PROBLEMS (pain or burning when urinating, or frequent urination) ?*BOWEL PROBLEMS (unusual diarrhea, constipation, pain near the anus) ?TENDERNESS IN MOUTH AND THROAT WITH OR WITHOUT PRESENCE OF ULCERS (sore throat, sores in mouth, or a toothache) ?UNUSUAL RASH, SWELLING OR PAIN  ?UNUSUAL VAGINAL DISCHARGE OR ITCHING  ? ?Items with * indicate a potential emergency and should be followed up as soon as possible or go to the Emergency Department if any problems should occur. ? ?Please show the CHEMOTHERAPY ALERT CARD or IMMUNOTHERAPY ALERT CARD at check-in to the  Emergency Department and triage nurse. ? ?Should you have questions after your visit or need to cancel or reschedule your appointment, please contact [facility]  Dept: [phone]  and follow the prompts.  Office hours are 8:00 a.m. to 4:30 p.m. Monday - Friday. Please note that voicemails left after 4:00 p.m. may not be returned until the following business day.  We are closed weekends and major holidays. You have access to a nurse at all times for urgent questions. Please call the main number to the clinic Dept: [phone] and follow the prompts. ? ? ?For any non-urgent questions, you may also contact your provider using MyChart. We now offer e-Visits for anyone 18 and older to request care online for non-urgent symptoms. For details visit [email or web address]. ?  ?Also download the MyChart app! Go to the app store, search "MyChart", open the app, select [facility], and log in with your MyChart username and password. ? ?Due to Covid, a mask is required upon entering the hospital/clinic. If you do not have a mask, one will be given to you upon arrival. For doctor visits, patients may have 1 support person aged 18 or older with them. For treatment visits, patients cannot have anyone with them due to current Covid guidelines and our immunocompromised population.  ? ?

## 2021-04-12 ENCOUNTER — Telehealth: Payer: Self-pay | Admitting: Internal Medicine

## 2021-04-12 NOTE — Telephone Encounter (Signed)
Scheduled per 03/01 los, patient has been called and notified of upcoming appointments. ?

## 2021-04-13 ENCOUNTER — Other Ambulatory Visit: Payer: Self-pay

## 2021-04-13 NOTE — Patient Outreach (Signed)
[facility]) Care Management  04/13/2021  JAYCION TREML 05/17/1940 [id number]   Telephone Assessment   Successful outreach call palced to aptietn. Spoke birefly as patient has troubel talking with trach for extended periods of time. He denisany new issues or ocncerns a tpreent. Sttes txs going well and he goes next week for next tx. Appeittie fair. Wgt fairly stable. No pain and deneis any resp issues. Paitetn has supprotive family in the home to assist as needed. Denies any RN CM needs or ocncerns at present.  Medications Reviewed Today     Reviewed by [name], RN (Registered Nurse) on 04/13/21 at [phone]  Med List Status: <None>   Medication Order Taking? Sig Documenting Provider Last Dose Status Informant  Alcohol Swabs (DROPSAFE ALCOHOL PREP) 70 % PADS [id number] No Apply topically. [provider] Taking Active Spouse/Significant Other  amLODipine (NORVASC) 5 MG tablet [id number] No Take 1 tablet (5 mg total) by mouth daily. [name], MD Taking Active Spouse/Significant Other  aspirin EC 81 MG tablet [id number] No Take 81 mg by mouth daily after breakfast.  [provider] Taking Active Spouse/Significant Other  bisacodyl (DULCOLAX) 5 MG EC tablet [id number] No Take 10 mg by mouth daily as needed for moderate constipation. [provider] Taking Active Spouse/Significant Other  Blood Glucose Calibration (TRUE METRIX LEVEL 1) Low SOLN [id number] No  [provider] Taking Active Spouse/Significant Other  cholecalciferol (VITAMIN D) 25 MCG (1000 UNIT) tablet [id number] No Take 1,000 Units by mouth daily. [provider] Taking Active Spouse/Significant Other  dronabinol (MARINOL) 2.5 MG capsule [id number] No Take 1 capsule (2.5 mg total) by mouth 2 (two) times daily before a meal. [name], MD Taking Active   ferrous sulfate 325 (65 FE) MG EC tablet [id number] No TAKE 1 TABLET (325 MG TOTAL) DAILY WITH  BREAKFAST. [name], MD Taking Active Spouse/Significant Other  Lancet Devices (TRUEDRAW LANCING DEVICE) [place] [id number] No Check sugars daily [name], MD Taking Active Spouse/Significant Other  lisinopril-hydrochlorothiazide (ZESTORETIC) 20-12.5 MG tablet [id number] No Take 1 tablet by mouth daily. [provider] Taking Active Spouse/Significant Other  metFORMIN (GLUCOPHAGE-XR) 500 MG 24 hr tablet [id number] No TAKE 1 TABLET EVERY DAY [name], MD Taking Active Spouse/Significant Other  Multiple Vitamin (MULTIVITAMIN WITH MINERALS) TABS tablet [id number] No Take 1 tablet by mouth daily after breakfast.  [provider] Taking Active Spouse/Significant Other  simvastatin (ZOCOR) 40 MG tablet [id number] No Take 40 mg by mouth daily. [provider] Taking Active Spouse/Significant Other  solifenacin (VESICARE) 5 MG tablet [id number] No TAKE 1 TABLET EVERY DAY [name], MD Taking Active Spouse/Significant Other  TRUE METRIX BLOOD GLUCOSE TEST test strip [id number] No SMARTSIG:Via Meter [provider] Taking Active Spouse/Significant Other  TRUEplus Lancets 33G MISC [id number] No 1 each by Does not apply route 2 (two) times daily. [name], MD Taking Active Spouse/Significant Other              Care Plan : RN Care Manager POC  Updates made by [name], RN since 04/13/2021 12:00 AM     Problem: Chronic Disease Mgmt of Chronic Condition-lung CA, DM   Priority: High     Long-Range Goal: Development of POC for Mgmt of Chronic Condition-   Start Date: 02/17/2021  Expected End Date: 02/17/2022  This Visit's Progress: On track  Priority: High  Note:    Current Barriers:  Chronic Disease Management support and  education needs related to DMII   RNCM Clinical Goal(s):  Patient will verbalize understanding of plan for management of DMII as evidenced by mgmt of chronic condition demonstrate Ongoing health  management independence as evidenced by A1C level within target range continue to work with RN Care Manager to address care management and care coordination needs related to  DMII as evidenced by adherence to CM Team Scheduled appointments through collaboration with RN Care manager, provider, and care team.   Interventions: POC sent to PCP upon initial assessment, quarterly and with any changes in patient's conditions Inter-disciplinary care team collaboration (see longitudinal plan of care) Evaluation of current treatment plan related to  self management and patient's adherence to plan as established by provider   Diabetes Interventions:  (Status:  Condition stable.  Not addressed this visit.) Long Term Goal Assessed patient's understanding of A1c goal: <7% Reviewed medications with patient and discussed importance of medication adherence Discussed plans with patient for ongoing care management follow up and provided patient with direct contact information for care management team Advised patient, providing education and rationale, to check cbg as ordered and record, calling MD for findings outside established parameters Lab Results  Component Value Date   HGBA1C 6.0 (A) 07/30/2020   Patient Goals/Self-Care Activities: Take all medications as prescribed Attend all scheduled provider appointments Call provider office for new concerns or questions  schedule appointment with eye doctor check feet daily for cuts, sores or redness take the blood sugar log to all doctor visits  Follow Up Plan:  Telephone follow up appointment with care management team member scheduled for:  within the month of May The patient has been provided with contact information for the care management team and has been advised to call with any health related questions or concerns.      Long-Range Goal: Development of POC for Mgmt of Chronic Condition-Cancer   Start Date: 02/17/2021  Expected End Date: 02/17/2022   This Visit's Progress: On track  Priority: High  Note:   Current Barriers:  Chronic Disease Management support and education needs related to Cancer(lung)   RNCM Clinical Goal(s):  Patient will verbalize understanding of plan for management of cancer as evidenced by mgmt of chronic condition take all medications exactly as prescribed and will call provider for medication related questions as evidenced by med adherence/compliance attend all scheduled medical appointments: including cancer tx appts as evidenced by completion of chemo/radiation tx sessions continue to work with RN Care Manager to address care management and care coordination needs related to  cancer as evidenced by adherence to CM Team Scheduled appointments through collaboration with RN Care manager, provider, and care team.   Interventions: POC sent to PCP upon initial assessment, quarterly and with any changes in patient's conditions Inter-disciplinary care team collaboration (see longitudinal plan of care) Evaluation of current treatment plan related to  self management and patient's adherence to plan as established by provider   Oncology:  (Status: Goal on track:  Yes.) Long Term Goal Assessment of understanding of oncology diagnosis:  Assessed patient understanding of cancer diagnosis and recommended treatment plan, Reviewed upcoming provider appointments and treatment appointments, Assessed support system. Has consistent/reliable family or other support: Yes, and Nutrition assessment performed   04/13/21-Patient reports txs going well. He is going very Wed for txs and tolerating them. Denies any SEs from txs at present.   Patient Goals/Self-Care Activities: Take all medications as prescribed Attend all scheduled provider appointments Call provider office for new concerns or questions  Follow Up Plan:  Telephone follow up appointment with care management team member scheduled for:  within the month of May The  patient has been provided with contact information for the care management team and has been advised to call with any health related questions or concerns.            Plan: RN CM discussed with patient next outreach within the month of May. Patient agrees to care plan and follow up.  [name], RN,BSN,CCM [place] Management Telephonic Care Management Coordinator Direct Phone: [phone] Toll Free: [phone] Fax: [phone]

## 2021-04-16 ENCOUNTER — Ambulatory Visit: Payer: Medicare HMO

## 2021-04-26 NOTE — Progress Notes (Signed)
[place] ?OFFICE PROGRESS NOTE ? ?[name], MD ?[address] ?[place] [address] ? ?DIAGNOSIS: Stage IV (T3, N2, M1 a) non-small cell lung cancer, squamous cell carcinoma presented with obstructive left lower lobe lung mass in addition to mediastinal lymphadenopathy as well as bilateral pulmonary nodules diagnosed in July 2021. ?  ?PDL1: 0% ? ?PRIOR THERAPY:  Palliative radiotherapy to the obstructive left lower lobe lung mass under the care of Dr. Lisbeth Renshaw. Completed on 09/20/19 ? ?CURRENT THERAPY:  Systemic chemotherapy 2 cycles of chemotherapy with carboplatin for an AUC of 5 and paclitaxel 175 mg/m2 in addition to immunotherapy with nivolumab 360 mg every 3 weeks and ipilimumab 1 mg/kg IV every 6 weeks followed by maintenance nivolumab and ipilimumab.  He started the first treatment on 09/04/2019.  He is status post 14 cycles of treatment.  ? ?INTERVAL HISTORY: ?Shaun Kennedy 81 y.o. male returns to the clinic today for a follow up visit. The patient is feeling well today without any concerning complaints. He follows closely with ENT for his tracheostomy. He sees them next week on 05/04/21.  The patient continues to tolerate treatment with immunotherapy well without any adverse side effects. Denies any fever, chills, night sweats, or weight loss. Denies any chest pain or hemoptysis. He sometimes has a cough related to mucus production. He has been taking mucinex. He denies changes with his shortness of breath. Denies any nausea, vomiting, constipation, or diarrhea. Denies any headache or visual changes. Denies any rashes or skin changes. The patient is here today for evaluation prior to starting cycle #15 ?  ? ? ? ?MEDICAL HISTORY: ?Past Medical History:  ?Diagnosis Date  ? Allergic rhinitis   ? Asthma   ? Carotid stenosis   ? Colon cancer [facility]) 2003  ? Diabetes mellitus ([place])   ? Diverticulosis   ? Dyslipidemia   ? ED (erectile dysfunction)   ? GERD (gastroesophageal reflux  disease)   ? H/O degenerative disc disease   ? Hemorrhoids   ? HTN (hypertension)   ? as a child  ? Hyperlipidemia   ? Lung cancer ([place])   ? LVH (left ventricular hypertrophy)   ? on EKG  ? Mass of lower lobe of left lung   ? Mediastinal adenopathy   ? Smoker   ? former  ? Wears dentures   ? Wears dentures   ? Wears glasses   ? Wears glasses   ? ? ?ALLERGIES:  has No Known Allergies. ? ?MEDICATIONS:  ?Current Outpatient Medications  ?Medication Sig Dispense Refill  ? Alcohol Swabs (DROPSAFE ALCOHOL PREP) 70 % PADS Apply topically.    ? amLODipine (NORVASC) 5 MG tablet Take 1 tablet (5 mg total) by mouth daily. 90 tablet 3  ? aspirin EC 81 MG tablet Take 81 mg by mouth daily after breakfast.     ? bisacodyl (DULCOLAX) 5 MG EC tablet Take 10 mg by mouth daily as needed for moderate constipation.    ? Blood Glucose Calibration (TRUE METRIX LEVEL 1) Low SOLN     ? cholecalciferol (VITAMIN D) 25 MCG (1000 UNIT) tablet Take 1,000 Units by mouth daily.    ? dronabinol (MARINOL) 2.5 MG capsule Take 1 capsule (2.5 mg total) by mouth 2 (two) times daily before a meal. 60 capsule 1  ? ferrous sulfate 325 (65 FE) MG EC tablet TAKE 1 TABLET (325 MG TOTAL) DAILY WITH BREAKFAST. 90 tablet 1  ? Lancet Devices (TRUEDRAW LANCING DEVICE) MISC Check sugars  daily 90 each 0  ? lisinopril-hydrochlorothiazide (ZESTORETIC) 20-12.5 MG tablet Take 1 tablet by mouth daily.    ? metFORMIN (GLUCOPHAGE-XR) 500 MG 24 hr tablet TAKE 1 TABLET EVERY DAY 90 tablet 1  ? Multiple Vitamin (MULTIVITAMIN WITH MINERALS) TABS tablet Take 1 tablet by mouth daily after breakfast.     ? simvastatin (ZOCOR) 40 MG tablet Take 40 mg by mouth daily.    ? solifenacin (VESICARE) 5 MG tablet TAKE 1 TABLET EVERY DAY 90 tablet 1  ? TRUE METRIX BLOOD GLUCOSE TEST test strip SMARTSIG:Via Meter    ? TRUEplus Lancets 33G MISC 1 each by Does not apply route 2 (two) times daily. 200 each 4  ? ?No current facility-administered medications for this visit.   ? ?Facility-Administered Medications Ordered in Other Visits  ?Medication Dose Route Frequency Provider Last Rate Last Admin  ? diphenhydrAMINE (BENADRYL) injection 25 mg  25 mg Intravenous Once Curt Bears, MD      ? famotidine (PEPCID) IVPB 20 mg premix  20 mg Intravenous Once Curt Bears, MD      ? heparin lock flush 100 unit/mL  500 Units Intracatheter Once PRN Curt Bears, MD      ? ipilimumab (YERVOY) 80 mg in sodium chloride 0.9 % 50 mL chemo infusion  1 mg/kg (Order-Specific) Intravenous Once Curt Bears, MD      ? nivolumab (OPDIVO) 360 mg in sodium chloride 0.9 % 100 mL chemo infusion  360 mg Intravenous Once Curt Bears, MD      ? sodium chloride flush (NS) 0.9 % injection 10 mL  10 mL Intracatheter PRN Curt Bears, MD   10 mL at 09/04/19 1556  ? sodium chloride flush (NS) 0.9 % injection 10 mL  10 mL Intracatheter PRN Curt Bears, MD      ? ? ?SURGICAL HISTORY:  ?Past Surgical History:  ?Procedure Laterality Date  ? BRONCHIAL BIOPSY  08/20/2019  ? Procedure: BRONCHIAL BIOPSIES;  Surgeon: Collene Gobble, MD;  Location: [facility] ENDOSCOPY;  Service: Pulmonary;;  ? BRONCHIAL BRUSHINGS  08/20/2019  ? Procedure: BRONCHIAL BRUSHINGS;  Surgeon: Collene Gobble, MD;  Location: [facility] ENDOSCOPY;  Service: Pulmonary;;  ? BRONCHIAL NEEDLE ASPIRATION BIOPSY  08/20/2019  ? Procedure: BRONCHIAL NEEDLE ASPIRATION BIOPSIES;  Surgeon: Collene Gobble, MD;  Location: [facility] ENDOSCOPY;  Service: Pulmonary;;  ? CARPAL TUNNEL RELEASE Right 01/09/2019  ? Procedure: CARPAL TUNNEL RELEASE;  Surgeon: Leandrew Koyanagi, MD;  Location: [place];  Service: Orthopedics;  Laterality: Right;  ? CARPAL TUNNEL WITH CUBITAL TUNNEL Right 11/18/2020  ? Procedure: CARPAL TUNNEL WITH CUBITAL TUNNEL;  Surgeon: Charlotte Crumb, MD;  Location: [place];  Service: Orthopedics;  Laterality: Right;  ? CATARACT EXTRACTION Right 2018  ? COLONOSCOPY  2007  ? Dr. Benson Norway  ? DIRECT LARYNGOSCOPY N/A 04/10/2020  ? Procedure:  DIRECT LARYNGOSCOPY;  Surgeon: Melida Quitter, MD;  Location: [place];  Service: ENT;  Laterality: N/A;  ? I & D EXTREMITY Right 04/02/2016  ? Procedure: IRRIGATION AND DEBRIDEMENT GREAT TOE;  Surgeon: Leandrew Koyanagi, MD;  Location: [facility];  Service: Orthopedics;  Laterality: Right;  ? IR IMAGING GUIDED PORT INSERTION  08/30/2019  ? MULTIPLE TOOTH EXTRACTIONS    ? RADIOACTIVE SEED IMPLANT  2003  ? TRACHEOSTOMY TUBE PLACEMENT N/A 04/10/2020  ? Procedure: TRACHEOSTOMY;  Surgeon: Melida Quitter, MD;  Location: [facility];  Service: ENT;  Laterality: N/A;  ? ULNAR TUNNEL RELEASE Right 01/09/2019  ? Procedure: RIGHT CUBITAL TUNNEL RELEASE AND  CARPAL TUNNEL RELEASE;  Surgeon: Leandrew Koyanagi, MD;  Location: [place];  Service: Orthopedics;  Laterality: Right;  ? UPPER GASTROINTESTINAL ENDOSCOPY    ? VIDEO BRONCHOSCOPY N/A 12/18/2019  ? Procedure: VIDEO BRONCHOSCOPY WITHOUT FLUORO;  Surgeon: Collene Gobble, MD;  Location: [facility] ENDOSCOPY;  Service: Cardiopulmonary;  Laterality: N/A;  ? VIDEO BRONCHOSCOPY WITH ENDOBRONCHIAL ULTRASOUND N/A 08/20/2019  ? Procedure: VIDEO BRONCHOSCOPY WITH ENDOBRONCHIAL ULTRASOUND;  Surgeon: Collene Gobble, MD;  Location: [facility] ENDOSCOPY;  Service: Pulmonary;  Laterality: N/A;  ? ? ?REVIEW OF SYSTEMS:   ?Review of Systems  ?Constitutional: Negative for appetite change, chills, fatigue, fever and unexpected weight change.  ?HENT: Negative for mouth sores, nosebleeds, sore throat and trouble swallowing.   ?Eyes: Negative for eye problems and icterus.  ?Respiratory: Positive for occasional cough depending on mucus blocking his trach. Negative for hemoptysis, shortness of breath and wheezing.   ?Cardiovascular: Negative for chest pain and leg swelling.  ?Gastrointestinal: Negative for abdominal pain, constipation, diarrhea, nausea and vomiting.  ?Genitourinary: Negative for bladder incontinence, difficulty urinating, dysuria, frequency and hematuria.   ?Musculoskeletal: Negative for back pain, gait  problem, neck pain and neck stiffness.  ?Skin: Negative for itching and rash.  ?Neurological: Negative for dizziness, extremity weakness, gait problem, headaches, light-headedness and seizures.  ?Hematological: Negative for adenopathy.

## 2021-04-28 ENCOUNTER — Inpatient Hospital Stay: Payer: Medicare HMO

## 2021-04-28 ENCOUNTER — Inpatient Hospital Stay: Payer: Medicare HMO | Admitting: Physician Assistant

## 2021-04-28 ENCOUNTER — Other Ambulatory Visit: Payer: Self-pay

## 2021-04-28 VITALS — BP 126/68 | HR 53 | Temp 97.8°F | Resp 17 | Wt 183.4 lb

## 2021-04-28 DIAGNOSIS — Z95828 Presence of other vascular implants and grafts: Secondary | ICD-10-CM

## 2021-04-28 DIAGNOSIS — C3432 Malignant neoplasm of lower lobe, left bronchus or lung: Secondary | ICD-10-CM

## 2021-04-28 DIAGNOSIS — Z5112 Encounter for antineoplastic immunotherapy: Secondary | ICD-10-CM

## 2021-04-28 DIAGNOSIS — Z85038 Personal history of other malignant neoplasm of large intestine: Secondary | ICD-10-CM | POA: Diagnosis not present

## 2021-04-28 DIAGNOSIS — Z7984 Long term (current) use of oral hypoglycemic drugs: Secondary | ICD-10-CM | POA: Diagnosis not present

## 2021-04-28 DIAGNOSIS — R630 Anorexia: Secondary | ICD-10-CM | POA: Diagnosis not present

## 2021-04-28 DIAGNOSIS — Z87891 Personal history of nicotine dependence: Secondary | ICD-10-CM | POA: Diagnosis not present

## 2021-04-28 DIAGNOSIS — Z79899 Other long term (current) drug therapy: Secondary | ICD-10-CM | POA: Diagnosis not present

## 2021-04-28 LAB — CBC WITH DIFFERENTIAL (CANCER CENTER ONLY)
Abs Immature Granulocytes: 0.01 10*3/uL (ref 0.00–0.07)
Basophils Absolute: 0 10*3/uL (ref 0.0–0.1)
Basophils Relative: 1 %
Eosinophils Absolute: 0.7 10*3/uL — ABNORMAL HIGH (ref 0.0–0.5)
Eosinophils Relative: 10 %
HCT: 30.7 % — ABNORMAL LOW (ref 39.0–52.0)
Hemoglobin: 10.1 g/dL — ABNORMAL LOW (ref 13.0–17.0)
Immature Granulocytes: 0 %
Lymphocytes Relative: 26 %
Lymphs Abs: 1.6 10*3/uL (ref 0.7–4.0)
MCH: 30.9 pg (ref 26.0–34.0)
MCHC: 32.9 g/dL (ref 30.0–36.0)
MCV: 93.9 fL (ref 80.0–100.0)
Monocytes Absolute: 0.7 10*3/uL (ref 0.1–1.0)
Monocytes Relative: 11 %
Neutro Abs: 3.3 10*3/uL (ref 1.7–7.7)
Neutrophils Relative %: 52 %
Platelet Count: 254 10*3/uL (ref 150–400)
RBC: 3.27 MIL/uL — ABNORMAL LOW (ref 4.22–5.81)
RDW: 13.1 % (ref 11.5–15.5)
WBC Count: 6.3 10*3/uL (ref 4.0–10.5)
nRBC: 0 % (ref 0.0–0.2)

## 2021-04-28 LAB — CMP (CANCER CENTER ONLY)
ALT: 12 U/L (ref 0–44)
AST: 14 U/L — ABNORMAL LOW (ref 15–41)
Albumin: 3.7 g/dL (ref 3.5–5.0)
Alkaline Phosphatase: 61 U/L (ref 38–126)
Anion gap: 7 (ref 5–15)
BUN: 22 mg/dL (ref 8–23)
CO2: 25 mmol/L (ref 22–32)
Calcium: 9.2 mg/dL (ref 8.9–10.3)
Chloride: 105 mmol/L (ref 98–111)
Creatinine: 1.24 mg/dL (ref 0.61–1.24)
GFR, Estimated: 58 mL/min — ABNORMAL LOW (ref >60)
Glucose, Bld: 94 mg/dL (ref 70–99)
Potassium: 3.9 mmol/L (ref 3.5–5.1)
Sodium: 137 mmol/L (ref 135–145)
Total Bilirubin: 0.9 mg/dL (ref 0.3–1.2)
Total Protein: 7.8 g/dL (ref 6.5–8.1)

## 2021-04-28 LAB — TSH: TSH: 1.095 u[IU]/mL (ref 0.320–4.118)

## 2021-04-28 MED ORDER — SODIUM CHLORIDE 0.9 % IV SOLN
360.0000 mg | Freq: Once | INTRAVENOUS | Status: AC
  Administered 2021-04-28: 360 mg via INTRAVENOUS
  Filled 2021-04-28: qty 24

## 2021-04-28 MED ORDER — SODIUM CHLORIDE 0.9 % IV SOLN: Freq: Once | INTRAVENOUS | Status: AC

## 2021-04-28 MED ORDER — SODIUM CHLORIDE 0.9 % IV SOLN
1.0000 mg/kg | Freq: Once | INTRAVENOUS | Status: AC
  Administered 2021-04-28: 80 mg via INTRAVENOUS
  Filled 2021-04-28: qty 16

## 2021-04-28 MED ORDER — FAMOTIDINE IN NACL 20-0.9 MG/50ML-% IV SOLN
20.0000 mg | Freq: Once | INTRAVENOUS | Status: AC
  Administered 2021-04-28: 20 mg via INTRAVENOUS
  Filled 2021-04-28: qty 50

## 2021-04-28 MED ORDER — SODIUM CHLORIDE 0.9% FLUSH
10.0000 mL | Freq: Once | INTRAVENOUS | Status: AC
  Administered 2021-04-28: 10 mL

## 2021-04-28 MED ORDER — SODIUM CHLORIDE 0.9% FLUSH
10.0000 mL | INTRAVENOUS | Status: DC | PRN
  Administered 2021-04-28: 10 mL

## 2021-04-28 MED ORDER — HEPARIN SOD (PORK) LOCK FLUSH 100 UNIT/ML IV SOLN
500.0000 [IU] | Freq: Once | INTRAVENOUS | Status: AC | PRN
  Administered 2021-04-28: 500 [IU]

## 2021-04-28 MED ORDER — DIPHENHYDRAMINE HCL 50 MG/ML IJ SOLN
25.0000 mg | Freq: Once | INTRAMUSCULAR | Status: AC
  Administered 2021-04-28: 25 mg via INTRAVENOUS
  Filled 2021-04-28: qty 1

## 2021-04-28 NOTE — Patient Instructions (Signed)
[facility]  Discharge Instructions: ?Thank you for choosing [facility] to provide your oncology and hematology care.  ? ?If you have a lab appointment with the [facility], please go directly to the [facility] and check in at the registration area. ?  ?Wear comfortable clothing and clothing appropriate for easy access to any Portacath or PICC line.  ? ?We strive to give you quality time with your provider. You may need to reschedule your appointment if you arrive late (15 or more minutes).  Arriving late affects you and other patients whose appointments are after yours.  Also, if you miss three or more appointments without notifying the office, you may be dismissed from the clinic at the provider?s discretion.    ?  ?For prescription refill requests, have your pharmacy contact our office and allow 72 hours for refills to be completed.   ? ?Today you received the following chemotherapy and/or immunotherapy agents: [place]    ?  ?To help prevent nausea and vomiting after your treatment, we encourage you to take your nausea medication as directed. ? ?BELOW ARE SYMPTOMS THAT SHOULD BE REPORTED IMMEDIATELY: ?*FEVER GREATER THAN 100.4 F (38 ?C) OR HIGHER ?*CHILLS OR SWEATING ?*NAUSEA AND VOMITING THAT IS NOT CONTROLLED WITH YOUR NAUSEA MEDICATION ?*UNUSUAL SHORTNESS OF BREATH ?*UNUSUAL BRUISING OR BLEEDING ?*URINARY PROBLEMS (pain or burning when urinating, or frequent urination) ?*BOWEL PROBLEMS (unusual diarrhea, constipation, pain near the anus) ?TENDERNESS IN MOUTH AND THROAT WITH OR WITHOUT PRESENCE OF ULCERS (sore throat, sores in mouth, or a toothache) ?UNUSUAL RASH, SWELLING OR PAIN  ?UNUSUAL VAGINAL DISCHARGE OR ITCHING  ? ?Items with * indicate a potential emergency and should be followed up as soon as possible or go to the Emergency Department if any problems should occur. ? ?Please show the CHEMOTHERAPY ALERT CARD or IMMUNOTHERAPY ALERT CARD at  check-in to the Emergency Department and triage nurse. ? ?Should you have questions after your visit or need to cancel or reschedule your appointment, please contact [facility]  Dept: [phone]  and follow the prompts.  Office hours are 8:00 a.m. to 4:30 p.m. Monday - Friday. Please note that voicemails left after 4:00 p.m. may not be returned until the following business day.  We are closed weekends and major holidays. You have access to a nurse at all times for urgent questions. Please call the main number to the clinic Dept: [phone] and follow the prompts. ? ? ?For any non-urgent questions, you may also contact your provider using MyChart. We now offer e-Visits for anyone 75 and older to request care online for non-urgent symptoms. For details visit [email or web address]. ?  ?Also download the MyChart app! Go to the app store, search "MyChart", open the app, select [facility], and log in with your MyChart username and password. ? ?Due to Covid, a mask is required upon entering the hospital/clinic. If you do not have a mask, one will be given to you upon arrival. For doctor visits, patients may have 1 support person aged 32 or older with them. For treatment visits, patients cannot have anyone with them due to current Covid guidelines and our immunocompromised population.  ? ?

## 2021-04-28 NOTE — Progress Notes (Signed)
Ipilimumab (YERVOY) Patient Monitoring Assessment  ? ?Is the patient experiencing any of the following general symptoms?:  ?[] Difficulty performing normal activities ?[] Feeling sluggish or cold all the time ?[] Unusual weight gain ?[] Constant or unusual headaches ?[] Feeling dizzy or faint ?[] Changes in eyesight (blurry vision, double vision, or other vision problems) ?[] Changes in mood or behavior (ex: decreased sex drive, irritability, or forgetfulness) ?[] Starting new medications (ex: steroids, other medications that lower immune response) ?[x] Patient is not experiencing any of the general symptoms above.  ? ? ?Gastrointestinal  ?Patient is having 1 bowel movements each day.  ?Is this different from baseline? [] Yes [x] No ?Are your stools watery or do they have a foul smell? [] Yes [x] No ?Have you seen blood in your stools? [] Yes [x] No ?Are your stools dark, tarry, or sticky? [] Yes [x] No ?Are you having pain or tenderness in your belly? [] Yes [x] No ? ?Skin ?Does your skin itch? [x] Yes [] No ?Do you have a rash? [] Yes [x] No ?Has your skin blistered and/or peeled? [] Yes [x] No ?Do you have sores in your mouth? [] Yes [x] No ? ?Hepatic ?Has your urine been dark or tea colored? [] Yes [x] No ?Have you noticed that your skin or the whites of your eyes are turning yellow? [] Yes [x] No ?Are you bleeding or bruising more easily than normal? [] Yes [x] No ?Are you nauseous and/or vomiting? [] Yes [x] No ?Do you have pain on the right side of your stomach? [] Yes [x] No ? ?Neurologic  ?Are you having unusual weakness of legs, arms, or face? [] Yes [x] No ?Are you having numbness or tingling in your hands or feet? [x] Yes [] No ? ?Nira Retort  ?

## 2021-05-04 ENCOUNTER — Other Ambulatory Visit: Payer: Self-pay

## 2021-05-04 ENCOUNTER — Ambulatory Visit (HOSPITAL_COMMUNITY)
Admission: RE | Admit: 2021-05-04 | Discharge: 2021-05-04 | Disposition: A | Discharge status: Home / Self Care | Payer: Medicare HMO | Source: Ambulatory Visit | Attending: Acute Care | Admitting: Acute Care

## 2021-05-04 DIAGNOSIS — J386 Stenosis of larynx: Secondary | ICD-10-CM | POA: Diagnosis not present

## 2021-05-04 DIAGNOSIS — C3490 Malignant neoplasm of unspecified part of unspecified bronchus or lung: Secondary | ICD-10-CM | POA: Diagnosis not present

## 2021-05-04 DIAGNOSIS — Z930 Tracheostomy status: Secondary | ICD-10-CM | POA: Diagnosis not present

## 2021-05-04 DIAGNOSIS — Z430 Encounter for attention to tracheostomy: Secondary | ICD-10-CM | POA: Diagnosis not present

## 2021-05-04 NOTE — Progress Notes (Signed)
Tracheostomy Procedure Note ? ?Shaun Kennedy ?[id number] ?10-06-1940 ? ?Pre Procedure Tracheostomy Information ? ?Trach BrandCristy Hilts ?Size:  6.0  6UN75R ?Style: Uncuffed ?Secured by: Velcro ? ? ?Procedure:Trach change and Trach clean ? ? ? ?Post Procedure Tracheostomy Information ? ?Trach BrandCristy Hilts ?Size:  6.0  [id number] ?Style: Uncuffed ?Secured by: Velcro ? ? ?Post Procedure Evaluation: ? ?ETCO2 positive color change from yellow to purple : Yes.   ?Vital signs:VSS ?Patients current condition: stable ?Complications: No apparent complications ?Trach site exam: clean, dry ?Wound care done: 4 x 4 gauze  drain ?Patient did tolerate procedure well. ? ? ?Education: ?none ? ?Prescription needs: ?none ? ? ? ?Additional needs: ?none ? ? ? ? ? ? ?  ?

## 2021-05-04 NOTE — Progress Notes (Signed)
Reason for visit  ?Lurline Idol change ? 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  He is actually quite functional and doing well at home.  Functional status well-preserved.  Still ambulatory and driving.  Presents today for planned tracheostomy change ? ?Review of systems: ?General: No fever, chills, sick exposures.  HEENT: No headache, nasal discharge, sinus pressure, tracheostomy site pain or drainage, no new cough, no change in phonation, no difficulty with PMV valve.  No sore throat.  No discomfort at tracheostomy site.  Pulmonary: No cough, no increased shortness of breath, no chest discomfort or wheezing.  Cardiac: No chest pain, no palpitations, no dizziness.  Abdomen: No nausea, vomiting, GU: No issues neuro: No issues ? ?Exam: ?Room air pulse oximetry mid 90s ?General 81 year old male patient who is ambulatory presenting today for planned tracheostomy changes ?HEENT normocephalic atraumatic no jugular venous distention size 6 tracheostomy unremarkable phonation ?Pulmonary: Clear to auscultation ?Cardiac regular rate and rhythm ?Abdomen soft nontender ?Extremities no edema ? ?Procedure ?The existing tracheostomy was removed, site inspected stoma unremarkable.  A new size 6 tracheostomy was placed without difficulty, and placement was verified via end-tidal CO2 patient tolerated well ? 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 ?Discussion ?Stable from tracheostomy standpoint ? ?Plan ?Return in 12 weeks for tracheostomy change ? ?My time 15 minutes ? ?Erick Colace ACNP-BC ?[facility] ?Pager # [phone] OR # [phone] if no answer ? ?

## 2021-05-11 ENCOUNTER — Encounter: Payer: Self-pay | Admitting: Family Medicine

## 2021-05-11 ENCOUNTER — Ambulatory Visit (INDEPENDENT_AMBULATORY_CARE_PROVIDER_SITE_OTHER): Payer: Medicare HMO | Admitting: Family Medicine

## 2021-05-11 VITALS — BP 120/66 | HR 63 | Temp 97.3°F | Wt 182.4 lb

## 2021-05-11 DIAGNOSIS — G5601 Carpal tunnel syndrome, right upper limb: Secondary | ICD-10-CM

## 2021-05-11 NOTE — Progress Notes (Signed)
? ?  Subjective:  ? ? Patient ID: Shaun Kennedy, male    DOB: 02/20/1940, 81 y.o.   MRN: [id number] ? ?HPI ?He is here for evaluation of a tingling sensation in his right hand.  This started roughly 5 days ago.  He states that all of his fingertips are numb.  He does have a previous history of CTS surgery in October of last year. ? ? ?Review of Systems ? ?   ?Objective:  ? Physical Exam ?Exam of the right hand shows of normal strength.  Tinel's test was negative however Phalen's test was positive   ?Assessment & Plan:  ?Carpal tunnel syndrome of right wrist - Plan: Ambulatory referral to Orthopedic Surgery ?Since he is having discomfort on the previously operated CTS, I will refer back to Dr. Burney Gauze who saw him originally. ? ?

## 2021-05-12 ENCOUNTER — Other Ambulatory Visit: Payer: Self-pay | Admitting: Family Medicine

## 2021-05-12 DIAGNOSIS — E1159 Type 2 diabetes mellitus with other circulatory complications: Secondary | ICD-10-CM

## 2021-05-18 DIAGNOSIS — G5621 Lesion of ulnar nerve, right upper limb: Secondary | ICD-10-CM | POA: Diagnosis not present

## 2021-05-18 DIAGNOSIS — G5603 Carpal tunnel syndrome, bilateral upper limbs: Secondary | ICD-10-CM | POA: Diagnosis not present

## 2021-05-18 DIAGNOSIS — G5622 Lesion of ulnar nerve, left upper limb: Secondary | ICD-10-CM | POA: Diagnosis not present

## 2021-05-19 ENCOUNTER — Other Ambulatory Visit: Payer: Self-pay

## 2021-05-19 ENCOUNTER — Other Ambulatory Visit: Payer: Medicare HMO

## 2021-05-19 ENCOUNTER — Inpatient Hospital Stay: Payer: Medicare HMO

## 2021-05-19 ENCOUNTER — Inpatient Hospital Stay: Payer: Medicare HMO | Attending: Internal Medicine | Admitting: Internal Medicine

## 2021-05-19 VITALS — BP 136/66 | HR 59 | Temp 97.3°F | Resp 18 | Ht 67.0 in | Wt 181.2 lb

## 2021-05-19 DIAGNOSIS — I10 Essential (primary) hypertension: Secondary | ICD-10-CM | POA: Diagnosis not present

## 2021-05-19 DIAGNOSIS — C3432 Malignant neoplasm of lower lobe, left bronchus or lung: Secondary | ICD-10-CM | POA: Diagnosis not present

## 2021-05-19 DIAGNOSIS — Z5112 Encounter for antineoplastic immunotherapy: Secondary | ICD-10-CM | POA: Diagnosis not present

## 2021-05-19 DIAGNOSIS — Z79899 Other long term (current) drug therapy: Secondary | ICD-10-CM | POA: Diagnosis not present

## 2021-05-19 DIAGNOSIS — Z7984 Long term (current) use of oral hypoglycemic drugs: Secondary | ICD-10-CM | POA: Diagnosis not present

## 2021-05-19 DIAGNOSIS — Z95828 Presence of other vascular implants and grafts: Secondary | ICD-10-CM

## 2021-05-19 DIAGNOSIS — Z7982 Long term (current) use of aspirin: Secondary | ICD-10-CM | POA: Insufficient documentation

## 2021-05-19 DIAGNOSIS — C3490 Malignant neoplasm of unspecified part of unspecified bronchus or lung: Secondary | ICD-10-CM | POA: Diagnosis not present

## 2021-05-19 DIAGNOSIS — E119 Type 2 diabetes mellitus without complications: Secondary | ICD-10-CM | POA: Insufficient documentation

## 2021-05-19 DIAGNOSIS — Z87891 Personal history of nicotine dependence: Secondary | ICD-10-CM | POA: Diagnosis not present

## 2021-05-19 LAB — CMP (CANCER CENTER ONLY)
ALT: 10 U/L (ref 0–44)
AST: 14 U/L — ABNORMAL LOW (ref 15–41)
Albumin: 3.6 g/dL (ref 3.5–5.0)
Alkaline Phosphatase: 62 U/L (ref 38–126)
Anion gap: 5 (ref 5–15)
BUN: 24 mg/dL — ABNORMAL HIGH (ref 8–23)
CO2: 27 mmol/L (ref 22–32)
Calcium: 9 mg/dL (ref 8.9–10.3)
Chloride: 108 mmol/L (ref 98–111)
Creatinine: 1.21 mg/dL (ref 0.61–1.24)
GFR, Estimated: 60 mL/min — ABNORMAL LOW (ref >60)
Glucose, Bld: 127 mg/dL — ABNORMAL HIGH (ref 70–99)
Potassium: 3.8 mmol/L (ref 3.5–5.1)
Sodium: 140 mmol/L (ref 135–145)
Total Bilirubin: 0.5 mg/dL (ref 0.3–1.2)
Total Protein: 7.8 g/dL (ref 6.5–8.1)

## 2021-05-19 LAB — TSH: TSH: 0.986 u[IU]/mL (ref 0.320–4.118)

## 2021-05-19 LAB — CBC WITH DIFFERENTIAL (CANCER CENTER ONLY)
Abs Immature Granulocytes: 0.01 10*3/uL (ref 0.00–0.07)
Basophils Absolute: 0 10*3/uL (ref 0.0–0.1)
Basophils Relative: 1 %
Eosinophils Absolute: 0.8 10*3/uL — ABNORMAL HIGH (ref 0.0–0.5)
Eosinophils Relative: 13 %
HCT: 30.3 % — ABNORMAL LOW (ref 39.0–52.0)
Hemoglobin: 9.9 g/dL — ABNORMAL LOW (ref 13.0–17.0)
Immature Granulocytes: 0 %
Lymphocytes Relative: 20 %
Lymphs Abs: 1.2 10*3/uL (ref 0.7–4.0)
MCH: 31 pg (ref 26.0–34.0)
MCHC: 32.7 g/dL (ref 30.0–36.0)
MCV: 95 fL (ref 80.0–100.0)
Monocytes Absolute: 0.5 10*3/uL (ref 0.1–1.0)
Monocytes Relative: 9 %
Neutro Abs: 3.3 10*3/uL (ref 1.7–7.7)
Neutrophils Relative %: 57 %
Platelet Count: 263 10*3/uL (ref 150–400)
RBC: 3.19 MIL/uL — ABNORMAL LOW (ref 4.22–5.81)
RDW: 12.9 % (ref 11.5–15.5)
WBC Count: 5.8 10*3/uL (ref 4.0–10.5)
nRBC: 0 % (ref 0.0–0.2)

## 2021-05-19 MED ORDER — SODIUM CHLORIDE 0.9 % IV SOLN
360.0000 mg | Freq: Once | INTRAVENOUS | Status: AC
  Administered 2021-05-19: 360 mg via INTRAVENOUS
  Filled 2021-05-19: qty 12

## 2021-05-19 MED ORDER — HEPARIN SOD (PORK) LOCK FLUSH 100 UNIT/ML IV SOLN
500.0000 [IU] | Freq: Once | INTRAVENOUS | Status: AC | PRN
  Administered 2021-05-19: 500 [IU]

## 2021-05-19 MED ORDER — SODIUM CHLORIDE 0.9% FLUSH
10.0000 mL | INTRAVENOUS | Status: DC | PRN
  Administered 2021-05-19: 10 mL

## 2021-05-19 MED ORDER — SODIUM CHLORIDE 0.9% FLUSH
10.0000 mL | Freq: Once | INTRAVENOUS | Status: AC
  Administered 2021-05-19: 10 mL

## 2021-05-19 MED ORDER — SODIUM CHLORIDE 0.9 % IV SOLN: Freq: Once | INTRAVENOUS | Status: AC

## 2021-05-19 NOTE — Progress Notes (Signed)
?    [facility] ?[phone]   [phone] ? ?OFFICE PROGRESS NOTE ? ?Shaun Lung, MD ?[address] ?[place] [address] ? ?DIAGNOSIS: stage IV (T3, N2, M1 a) non-small cell Kennedy cancer, squamous cell carcinoma presented with obstructive left lower lobe Kennedy mass in addition to mediastinal lymphadenopathy as well as bilateral pulmonary nodules diagnosed in July 2021. ?  ?PDL1: 0% ?  ?PRIOR THERAPY: None ?  ?CURRENT THERAPY: ?1) Palliative radiotherapy to the obstructive left lower lobe Kennedy mass under the care of Dr. Lisbeth Renshaw.  ?2)  systemic chemotherapy 2 cycles of chemotherapy with carboplatin for an AUC of 5 and paclitaxel 175 mg/m2 in addition to immunotherapy with nivolumab 360 mg every 3 weeks and ipilimumab 1 mg/kg IV every 6 weeks followed by maintenance nivolumab and ipilimumab.  He started the first treatment on 09/04/2019.  He is status post 14 cycles and day 1 of cycle #15 of the treatment. ? ?INTERVAL HISTORY: ?Shaun Kennedy 81 y.o. male returns to the clinic today for follow-up visit.  The patient is feeling fine today with no concerning complaints.  He denied having any current chest pain, shortness of breath, cough or hemoptysis.  He denied having any fever or chills.  He has no nausea, vomiting, diarrhea or constipation.  He has no headache or visual changes.  He denied having any weight loss or night sweats.  He continues to tolerate his treatment with immunotherapy fairly well.  The patient is here today for evaluation before starting day 22 of cycle #15. ? ? ? ?MEDICAL HISTORY: ?Past Medical History:  ?Diagnosis Date  ? Allergic rhinitis   ? Asthma   ? Carotid stenosis   ? Colon cancer [facility]) 2003  ? Diabetes mellitus ([place])   ? Diverticulosis   ? Dyslipidemia   ? ED (erectile dysfunction)   ? GERD (gastroesophageal reflux disease)   ? H/O degenerative disc disease   ? Hemorrhoids   ? HTN (hypertension)   ? as a child  ? Hyperlipidemia   ? Kennedy cancer  ([place])   ? LVH (left ventricular hypertrophy)   ? on EKG  ? Mass of lower lobe of left Kennedy   ? Mediastinal adenopathy   ? Smoker   ? former  ? Wears dentures   ? Wears dentures   ? Wears glasses   ? Wears glasses   ? ? ?ALLERGIES:  has No Known Allergies. ? ?MEDICATIONS:  ?Current Outpatient Medications  ?Medication Sig Dispense Refill  ? gabapentin (NEURONTIN) 100 MG capsule Take by mouth.    ? linaclotide (LINZESS) 72 MCG capsule Take by mouth.    ? Alcohol Swabs (DROPSAFE ALCOHOL PREP) 70 % PADS Apply topically.    ? amLODipine (NORVASC) 5 MG tablet TAKE 1 TABLET EVERY DAY 90 tablet 3  ? aspirin EC 81 MG tablet Take 81 mg by mouth daily after breakfast.     ? bisacodyl (DULCOLAX) 5 MG EC tablet Take 10 mg by mouth daily as needed for moderate constipation.    ? Blood Glucose Calibration (TRUE METRIX LEVEL 1) Low SOLN     ? cholecalciferol (VITAMIN D) 25 MCG (1000 UNIT) tablet Take 1,000 Units by mouth daily.    ? dronabinol (MARINOL) 2.5 MG capsule Take 1 capsule (2.5 mg total) by mouth 2 (two) times daily before a meal. 60 capsule 1  ? ferrous sulfate 325 (65 FE) MG EC tablet TAKE 1 TABLET (325 MG TOTAL) DAILY WITH BREAKFAST.  90 tablet 1  ? Lancet Devices (TRUEDRAW LANCING DEVICE) MISC Check sugars daily 90 each 0  ? lisinopril-hydrochlorothiazide (ZESTORETIC) 20-12.5 MG tablet TAKE 1 TABLET EVERY DAY 90 tablet 1  ? metFORMIN (GLUCOPHAGE-XR) 500 MG 24 hr tablet TAKE 1 TABLET EVERY DAY 90 tablet 1  ? methylPREDNISolone (MEDROL DOSEPAK) 4 MG TBPK tablet Take by mouth.    ? Multiple Vitamin (MULTIVITAMIN WITH MINERALS) TABS tablet Take 1 tablet by mouth daily after breakfast.     ? simvastatin (ZOCOR) 40 MG tablet TAKE 1 TABLET EVERY DAY 90 tablet 1  ? solifenacin (VESICARE) 5 MG tablet TAKE 1 TABLET EVERY DAY 90 tablet 1  ? TRUE METRIX BLOOD GLUCOSE TEST test strip SMARTSIG:Via Meter    ? TRUEplus Lancets 33G MISC 1 each by Does not apply route 2 (two) times daily. 200 each 4  ? ?No current facility-administered  medications for this visit.  ? ?Facility-Administered Medications Ordered in Other Visits  ?Medication Dose Route Frequency Provider Last Rate Last Admin  ? sodium chloride flush (NS) 0.9 % injection 10 mL  10 mL Intracatheter PRN Curt Bears, MD   10 mL at 09/04/19 1556  ? ? ?SURGICAL HISTORY:  ?Past Surgical History:  ?Procedure Laterality Date  ? BRONCHIAL BIOPSY  08/20/2019  ? Procedure: BRONCHIAL BIOPSIES;  Surgeon: Collene Gobble, MD;  Location: [facility] ENDOSCOPY;  Service: Pulmonary;;  ? BRONCHIAL BRUSHINGS  08/20/2019  ? Procedure: BRONCHIAL BRUSHINGS;  Surgeon: Collene Gobble, MD;  Location: [facility] ENDOSCOPY;  Service: Pulmonary;;  ? BRONCHIAL NEEDLE ASPIRATION BIOPSY  08/20/2019  ? Procedure: BRONCHIAL NEEDLE ASPIRATION BIOPSIES;  Surgeon: Collene Gobble, MD;  Location: [facility] ENDOSCOPY;  Service: Pulmonary;;  ? CARPAL TUNNEL RELEASE Right 01/09/2019  ? Procedure: CARPAL TUNNEL RELEASE;  Surgeon: Leandrew Koyanagi, MD;  Location: [place];  Service: Orthopedics;  Laterality: Right;  ? CARPAL TUNNEL WITH CUBITAL TUNNEL Right 11/18/2020  ? Procedure: CARPAL TUNNEL WITH CUBITAL TUNNEL;  Surgeon: Charlotte Crumb, MD;  Location: [place];  Service: Orthopedics;  Laterality: Right;  ? CATARACT EXTRACTION Right 2018  ? COLONOSCOPY  2007  ? Dr. Benson Norway  ? DIRECT LARYNGOSCOPY N/A 04/10/2020  ? Procedure: DIRECT LARYNGOSCOPY;  Surgeon: Melida Quitter, MD;  Location: [place];  Service: ENT;  Laterality: N/A;  ? I & D EXTREMITY Right 04/02/2016  ? Procedure: IRRIGATION AND DEBRIDEMENT GREAT TOE;  Surgeon: Leandrew Koyanagi, MD;  Location: [place];  Service: Orthopedics;  Laterality: Right;  ? IR IMAGING GUIDED PORT INSERTION  08/30/2019  ? MULTIPLE TOOTH EXTRACTIONS    ? RADIOACTIVE SEED IMPLANT  2003  ? TRACHEOSTOMY TUBE PLACEMENT N/A 04/10/2020  ? Procedure: TRACHEOSTOMY;  Surgeon: Melida Quitter, MD;  Location: [place];  Service: ENT;  Laterality: N/A;  ? ULNAR TUNNEL RELEASE Right 01/09/2019  ? Procedure: RIGHT CUBITAL TUNNEL RELEASE  AND CARPAL TUNNEL RELEASE;  Surgeon: Leandrew Koyanagi, MD;  Location: [place];  Service: Orthopedics;  Laterality: Right;  ? UPPER GASTROINTESTINAL ENDOSCOPY    ? VIDEO BRONCHOSCOPY N/A 12/18/2019  ? Procedure: VIDEO BRONCHOSCOPY WITHOUT FLUORO;  Surgeon: Collene Gobble, MD;  Location: [facility] ENDOSCOPY;  Service: Cardiopulmonary;  Laterality: N/A;  ? VIDEO BRONCHOSCOPY WITH ENDOBRONCHIAL ULTRASOUND N/A 08/20/2019  ? Procedure: VIDEO BRONCHOSCOPY WITH ENDOBRONCHIAL ULTRASOUND;  Surgeon: Collene Gobble, MD;  Location: [facility] ENDOSCOPY;  Service: Pulmonary;  Laterality: N/A;  ? ? ?REVIEW OF SYSTEMS:  A comprehensive review of systems was negative except for: Constitutional: positive for anorexia  ? ?  PHYSICAL EXAMINATION: General appearance: alert, cooperative, and no distress ?Head: Normocephalic, without obvious abnormality, atraumatic ?Neck: no adenopathy, no JVD, supple, symmetrical, trachea midline, and thyroid not enlarged, symmetric, no tenderness/mass/nodules ?Lymph nodes: Cervical, supraclavicular, and axillary nodes normal. ?Resp: clear to auscultation bilaterally ?Back: symmetric, no curvature. ROM normal. No CVA tenderness. ?Cardio: regular rate and rhythm, S1, S2 normal, no murmur, click, rub or gallop ?GI: soft, non-tender; bowel sounds normal; no masses,  no organomegaly ?Extremities: extremities normal, atraumatic, no cyanosis or edema ? ?ECOG PERFORMANCE STATUS: 1 - Symptomatic but completely ambulatory ? ?Blood pressure 136/66, pulse (!) 59, temperature (!) 97.3 ?F (36.3 ?C), temperature source Tympanic, resp. rate 18, height '5\' 7"'  (1.702 m), weight 181 lb 3.2 oz (82.2 kg), SpO2 100 %. ? ?LABORATORY DATA: ?Lab Results  ?Component Value Date  ? WBC 5.8 05/19/2021  ? HGB 9.9 (L) 05/19/2021  ? HCT 30.3 (L) 05/19/2021  ? MCV 95.0 05/19/2021  ? PLT 263 05/19/2021  ? ? ?  Chemistry   ?   ?Component Value Date/Time  ? NA 137 04/28/2021 1035  ? NA 132 (L) 08/14/2019 0814  ? K 3.9 04/28/2021 1035  ? CL  105 04/28/2021 1035  ? CO2 25 04/28/2021 1035  ? BUN 22 04/28/2021 1035  ? BUN 16 08/14/2019 0918  ? CREATININE 1.24 04/28/2021 1035  ? CREATININE 1.25 (H) 08/08/2016 0912  ?    ?Component Value Date

## 2021-05-19 NOTE — Patient Instructions (Signed)
[facility] MEDICAL [facility]COLOGY  Discharge Instructions: ?Thank you for choosing [facility] to provide your oncology and hematology care.  ? ?If you have a lab appointment with the Cancer Center, please go directly to the Cancer Center and check in at the registration area. ?  ?Wear comfortable clothing and clothing appropriate for easy access to any Portacath or PICC line.  ? ?We strive to give you quality time with your provider. You may need to reschedule your appointment if you arrive late (15 or more minutes).  Arriving late affects you and other patients whose appointments are after yours.  Also, if you miss three or more appointments without notifying the office, you may be dismissed from the clinic at the provider?s discretion.    ?  ?For prescription refill requests, have your pharmacy contact our office and allow 72 hours for refills to be completed.   ? ?Today you received the following chemotherapy and/or immunotherapy agents Opdivo    ?  ?To help prevent nausea and vomiting after your treatment, we encourage you to take your nausea medication as directed. ? ?BELOW ARE SYMPTOMS THAT SHOULD BE REPORTED IMMEDIATELY: ?*FEVER GREATER THAN 100.4 F (38 ?C) OR HIGHER ?*CHILLS OR SWEATING ?*NAUSEA AND VOMITING THAT IS NOT CONTROLLED WITH YOUR NAUSEA MEDICATION ?*UNUSUAL SHORTNESS OF BREATH ?*UNUSUAL BRUISING OR BLEEDING ?*URINARY PROBLEMS (pain or burning when urinating, or frequent urination) ?*BOWEL PROBLEMS (unusual diarrhea, constipation, pain near the anus) ?TENDERNESS IN MOUTH AND THROAT WITH OR WITHOUT PRESENCE OF ULCERS (sore throat, sores in mouth, or a toothache) ?UNUSUAL RASH, SWELLING OR PAIN  ?UNUSUAL VAGINAL DISCHARGE OR ITCHING  ? ?Items with * indicate a potential emergency and should be followed up as soon as possible or go to the Emergency Department if any problems should occur. ? ?Please show the CHEMOTHERAPY ALERT CARD or IMMUNOTHERAPY ALERT CARD at check-in to the  Emergency Department and triage nurse. ? ?Should you have questions after your visit or need to cancel or reschedule your appointment, please contact [facility]  Dept: [phone]  and follow the prompts.  Office hours are 8:00 a.m. to 4:30 p.m. Monday - Friday. Please note that voicemails left after 4:00 p.m. may not be returned until the following business day.  We are closed weekends and major holidays. You have access to a nurse at all times for urgent questions. Please call the main number to the clinic Dept: [phone] and follow the prompts. ? ? ?For any non-urgent questions, you may also contact your provider using MyChart. We now offer e-Visits for anyone 18 and older to request care online for non-urgent symptoms. For details visit [email or web address]. ?  ?Also download the MyChart app! Go to the app store, search "MyChart", open the app, select [facility], and log in with your MyChart username and password. ? ?Due to Covid, a mask is required upon entering the hospital/clinic. If you do not have a mask, one will be given to you upon arrival. For doctor visits, patients may have 1 support person aged 18 or older with them. For treatment visits, patients cannot have anyone with them due to current Covid guidelines and our immunocompromised population.  ? ?

## 2021-06-04 ENCOUNTER — Ambulatory Visit (HOSPITAL_COMMUNITY)
Admission: RE | Admit: 2021-06-04 | Discharge: 2021-06-04 | Disposition: A | Discharge status: Home / Self Care | Payer: Medicare HMO | Source: Ambulatory Visit | Attending: Internal Medicine | Admitting: Internal Medicine

## 2021-06-04 DIAGNOSIS — C3490 Malignant neoplasm of unspecified part of unspecified bronchus or lung: Secondary | ICD-10-CM | POA: Diagnosis not present

## 2021-06-04 DIAGNOSIS — J432 Centrilobular emphysema: Secondary | ICD-10-CM | POA: Diagnosis not present

## 2021-06-04 DIAGNOSIS — R911 Solitary pulmonary nodule: Secondary | ICD-10-CM | POA: Diagnosis not present

## 2021-06-04 DIAGNOSIS — N281 Cyst of kidney, acquired: Secondary | ICD-10-CM | POA: Diagnosis not present

## 2021-06-04 DIAGNOSIS — K5790 Diverticulosis of intestine, part unspecified, without perforation or abscess without bleeding: Secondary | ICD-10-CM | POA: Diagnosis not present

## 2021-06-04 MED ORDER — IOHEXOL 300 MG/ML  SOLN
100.0000 mL | Freq: Once | INTRAMUSCULAR | Status: AC | PRN
  Administered 2021-06-04: 100 mL via INTRAVENOUS

## 2021-06-04 MED ORDER — SODIUM CHLORIDE (PF) 0.9 % IJ SOLN
INTRAMUSCULAR | Status: DC
Start: 2021-06-04 — End: 2021-06-05
  Filled 2021-06-04: qty 50

## 2021-06-08 ENCOUNTER — Telehealth: Payer: Self-pay

## 2021-06-09 ENCOUNTER — Inpatient Hospital Stay: Payer: Medicare HMO

## 2021-06-09 ENCOUNTER — Other Ambulatory Visit: Payer: Self-pay

## 2021-06-09 ENCOUNTER — Inpatient Hospital Stay: Payer: Medicare HMO | Attending: Internal Medicine | Admitting: Internal Medicine

## 2021-06-09 ENCOUNTER — Other Ambulatory Visit: Payer: Medicare HMO

## 2021-06-09 VITALS — BP 132/65 | HR 58 | Temp 97.8°F | Resp 16 | Wt 182.4 lb

## 2021-06-09 DIAGNOSIS — Z7982 Long term (current) use of aspirin: Secondary | ICD-10-CM | POA: Diagnosis not present

## 2021-06-09 DIAGNOSIS — C3432 Malignant neoplasm of lower lobe, left bronchus or lung: Secondary | ICD-10-CM | POA: Diagnosis not present

## 2021-06-09 DIAGNOSIS — Z79899 Other long term (current) drug therapy: Secondary | ICD-10-CM | POA: Insufficient documentation

## 2021-06-09 DIAGNOSIS — Z95828 Presence of other vascular implants and grafts: Secondary | ICD-10-CM

## 2021-06-09 DIAGNOSIS — Z7984 Long term (current) use of oral hypoglycemic drugs: Secondary | ICD-10-CM | POA: Diagnosis not present

## 2021-06-09 DIAGNOSIS — Z87891 Personal history of nicotine dependence: Secondary | ICD-10-CM | POA: Insufficient documentation

## 2021-06-09 DIAGNOSIS — Z5112 Encounter for antineoplastic immunotherapy: Secondary | ICD-10-CM

## 2021-06-09 LAB — CBC WITH DIFFERENTIAL (CANCER CENTER ONLY)
Abs Immature Granulocytes: 0.01 10*3/uL (ref 0.00–0.07)
Basophils Absolute: 0 10*3/uL (ref 0.0–0.1)
Basophils Relative: 0 %
Eosinophils Absolute: 0.7 10*3/uL — ABNORMAL HIGH (ref 0.0–0.5)
Eosinophils Relative: 12 %
HCT: 30.7 % — ABNORMAL LOW (ref 39.0–52.0)
Hemoglobin: 10.4 g/dL — ABNORMAL LOW (ref 13.0–17.0)
Immature Granulocytes: 0 %
Lymphocytes Relative: 23 %
Lymphs Abs: 1.4 10*3/uL (ref 0.7–4.0)
MCH: 31.6 pg (ref 26.0–34.0)
MCHC: 33.9 g/dL (ref 30.0–36.0)
MCV: 93.3 fL (ref 80.0–100.0)
Monocytes Absolute: 0.5 10*3/uL (ref 0.1–1.0)
Monocytes Relative: 8 %
Neutro Abs: 3.3 10*3/uL (ref 1.7–7.7)
Neutrophils Relative %: 57 %
Platelet Count: 255 10*3/uL (ref 150–400)
RBC: 3.29 MIL/uL — ABNORMAL LOW (ref 4.22–5.81)
RDW: 12.8 % (ref 11.5–15.5)
WBC Count: 5.9 10*3/uL (ref 4.0–10.5)
nRBC: 0 % (ref 0.0–0.2)

## 2021-06-09 LAB — CMP (CANCER CENTER ONLY)
ALT: 17 U/L (ref 0–44)
AST: 19 U/L (ref 15–41)
Albumin: 3.5 g/dL (ref 3.5–5.0)
Alkaline Phosphatase: 58 U/L (ref 38–126)
Anion gap: 6 (ref 5–15)
BUN: 16 mg/dL (ref 8–23)
CO2: 25 mmol/L (ref 22–32)
Calcium: 8.8 mg/dL — ABNORMAL LOW (ref 8.9–10.3)
Chloride: 108 mmol/L (ref 98–111)
Creatinine: 1.19 mg/dL (ref 0.61–1.24)
GFR, Estimated: >60 mL/min (ref >60)
Glucose, Bld: 132 mg/dL — ABNORMAL HIGH (ref 70–99)
Potassium: 3.6 mmol/L (ref 3.5–5.1)
Sodium: 139 mmol/L (ref 135–145)
Total Bilirubin: 0.6 mg/dL (ref 0.3–1.2)
Total Protein: 7.8 g/dL (ref 6.5–8.1)

## 2021-06-09 LAB — TSH: TSH: 2.076 u[IU]/mL (ref 0.350–4.500)

## 2021-06-09 MED ORDER — SODIUM CHLORIDE 0.9% FLUSH
10.0000 mL | INTRAVENOUS | Status: DC | PRN
  Administered 2021-06-09: 10 mL

## 2021-06-09 MED ORDER — HEPARIN SOD (PORK) LOCK FLUSH 100 UNIT/ML IV SOLN
500.0000 [IU] | Freq: Once | INTRAVENOUS | Status: AC | PRN
  Administered 2021-06-09: 500 [IU]

## 2021-06-09 MED ORDER — SODIUM CHLORIDE 0.9 % IV SOLN
360.0000 mg | Freq: Once | INTRAVENOUS | Status: AC
  Administered 2021-06-09: 360 mg via INTRAVENOUS
  Filled 2021-06-09: qty 24

## 2021-06-09 MED ORDER — SODIUM CHLORIDE 0.9 % IV SOLN
1.0000 mg/kg | Freq: Once | INTRAVENOUS | Status: AC
  Administered 2021-06-09: 80 mg via INTRAVENOUS
  Filled 2021-06-09: qty 16

## 2021-06-09 MED ORDER — FAMOTIDINE IN NACL 20-0.9 MG/50ML-% IV SOLN
20.0000 mg | Freq: Once | INTRAVENOUS | Status: AC
  Administered 2021-06-09: 20 mg via INTRAVENOUS
  Filled 2021-06-09: qty 50

## 2021-06-09 MED ORDER — DIPHENHYDRAMINE HCL 50 MG/ML IJ SOLN
25.0000 mg | Freq: Once | INTRAMUSCULAR | Status: AC
  Administered 2021-06-09: 25 mg via INTRAVENOUS
  Filled 2021-06-09: qty 1

## 2021-06-09 MED ORDER — SODIUM CHLORIDE 0.9 % IV SOLN: Freq: Once | INTRAVENOUS | Status: AC

## 2021-06-09 MED ORDER — SODIUM CHLORIDE 0.9% FLUSH
10.0000 mL | Freq: Once | INTRAVENOUS | Status: AC
  Administered 2021-06-09: 10 mL

## 2021-06-09 NOTE — Patient Instructions (Signed)
[facility]  Discharge Instructions: ?Thank you for choosing [facility] to provide your oncology and hematology care.  ? ?If you have a lab appointment with the [facility], please go directly to the [facility] and check in at the registration area. ?  ?Wear comfortable clothing and clothing appropriate for easy access to any Portacath or PICC line.  ? ?We strive to give you quality time with your provider. You may need to reschedule your appointment if you arrive late (15 or more minutes).  Arriving late affects you and other patients whose appointments are after yours.  Also, if you miss three or more appointments without notifying the office, you may be dismissed from the clinic at the provider?s discretion.    ?  ?For prescription refill requests, have your pharmacy contact our office and allow 72 hours for refills to be completed.   ? ?Today you received the following chemotherapy and/or immunotherapy agents: Opdivo and Yerv[place]    ?  ?To help prevent nausea and vomiting after your treatment, we encourage you to take your nausea medication as directed. ? ?BELOW ARE SYMPTOMS THAT SHOULD BE REPORTED IMMEDIATELY: ?*FEVER GREATER THAN 100.4 F (38 ?C) OR HIGHER ?*CHILLS OR SWEATING ?*NAUSEA AND VOMITING THAT IS NOT CONTROLLED WITH YOUR NAUSEA MEDICATION ?*UNUSUAL SHORTNESS OF BREATH ?*UNUSUAL BRUISING OR BLEEDING ?*URINARY PROBLEMS (pain or burning when urinating, or frequent urination) ?*BOWEL PROBLEMS (unusual diarrhea, constipation, pain near the anus) ?TENDERNESS IN MOUTH AND THROAT WITH OR WITHOUT PRESENCE OF ULCERS (sore throat, sores in mouth, or a toothache) ?UNUSUAL RASH, SWELLING OR PAIN  ?UNUSUAL VAGINAL DISCHARGE OR ITCHING  ? ?Items with * indicate a potential emergency and should be followed up as soon as possible or go to the Emergency Department if any problems should occur. ? ?Please show the CHEMOTHERAPY ALERT CARD or IMMUNOTHERAPY ALERT CARD at  check-in to the Emergency Department and triage nurse. ? ?Should you have questions after your visit or need to cancel or reschedule your appointment, please contact [facility]  Dept: [phone]  and follow the prompts.  Office hours are 8:00 a.m. to 4:30 p.m. Monday - Friday. Please note that voicemails left after 4:00 p.m. may not be returned until the following business day.  We are closed weekends and major holidays. You have access to a nurse at all times for urgent questions. Please call the main number to the clinic Dept: [phone] and follow the prompts. ? ? ?For any non-urgent questions, you may also contact your provider using MyChart. We now offer e-Visits for anyone 45 and older to request care online for non-urgent symptoms. For details visit [email or web address]. ?  ?Also download the MyChart app! Go to the app store, search "MyChart", open the app, select [facility], and log in with your MyChart username and password. ? ?Due to Covid, a mask is required upon entering the hospital/clinic. If you do not have a mask, one will be given to you upon arrival. For doctor visits, patients may have 1 support person aged 55 or older with them. For treatment visits, patients cannot have anyone with them due to current Covid guidelines and our immunocompromised population.  ? ?

## 2021-06-09 NOTE — Progress Notes (Signed)
?    [name] ?Telephone:([phone]) [phone]   [phone] ? ?OFFICE PROGRESS NOTE ? ?[name], MD ?[address] ?[place] [address] ? ?DIAGNOSIS: stage IV (T3, N2, M1 a) non-small cell lung cancer, squamous cell carcinoma presented with obstructive left lower lobe lung mass in addition to mediastinal lymphadenopathy as well as bilateral pulmonary nodules diagnosed in July 2021. ?  ?PDL1: 0% ?  ?PRIOR THERAPY: None ?  ?CURRENT THERAPY: ?1) Palliative radiotherapy to the obstructive left lower lobe lung mass under the care of Dr. Lisbeth Renshaw.  ?2)  systemic chemotherapy 2 cycles of chemotherapy with carboplatin for an AUC of 5 and paclitaxel 175 mg/m2 in addition to immunotherapy with nivolumab 360 mg every 3 weeks and ipilimumab 1 mg/kg IV every 6 weeks followed by maintenance nivolumab and ipilimumab.  He started the first treatment on 09/04/2019.  He is status post 15 cycles. ? ?INTERVAL HISTORY: ?Shaun Kennedy 81 y.o. male returns to the clinic today for follow-up visit.  The patient is feeling fine today with no concerning complaints.  He has been tolerating his treatment with immunotherapy fairly well with no significant adverse effects.  He denied having any chest pain, shortness of breath, cough or hemoptysis.  He has no nausea, vomiting, diarrhea or constipation.  He has no headache or visual changes.  He has no recent weight loss or night sweats.  He had repeat CT scan of the chest, abdomen pelvis performed recently and he is here for evaluation and discussion of his scan results. ? ?MEDICAL HISTORY: ?Past Medical History:  ?Diagnosis Date  ? Allergic rhinitis   ? Asthma   ? Carotid stenosis   ? Colon cancer [facility]) 2003  ? Diabetes mellitus ([place])   ? Diverticulosis   ? Dyslipidemia   ? ED (erectile dysfunction)   ? GERD (gastroesophageal reflux disease)   ? H/O degenerative disc disease   ? Hemorrhoids   ? HTN (hypertension)   ? as a child  ? Hyperlipidemia   ? Lung cancer  ([place])   ? LVH (left ventricular hypertrophy)   ? on EKG  ? Mass of lower lobe of left lung   ? Mediastinal adenopathy   ? Smoker   ? former  ? Wears dentures   ? Wears dentures   ? Wears glasses   ? Wears glasses   ? ? ?ALLERGIES:  has No Known Allergies. ? ?MEDICATIONS:  ?Current Outpatient Medications  ?Medication Sig Dispense Refill  ? Alcohol Swabs (DROPSAFE ALCOHOL PREP) 70 % PADS Apply topically.    ? amLODipine (NORVASC) 5 MG tablet TAKE 1 TABLET EVERY DAY 90 tablet 3  ? aspirin EC 81 MG tablet Take 81 mg by mouth daily after breakfast.     ? bisacodyl (DULCOLAX) 5 MG EC tablet Take 10 mg by mouth daily as needed for moderate constipation.    ? Blood Glucose Calibration (TRUE METRIX LEVEL 1) Low SOLN     ? cholecalciferol (VITAMIN D) 25 MCG (1000 UNIT) tablet Take 1,000 Units by mouth daily.    ? dronabinol (MARINOL) 2.5 MG capsule Take 1 capsule (2.5 mg total) by mouth 2 (two) times daily before a meal. 60 capsule 1  ? ferrous sulfate 325 (65 FE) MG EC tablet TAKE 1 TABLET (325 MG TOTAL) DAILY WITH BREAKFAST. 90 tablet 1  ? gabapentin (NEURONTIN) 100 MG capsule Take by mouth.    ? Lancet Devices (TRUEDRAW LANCING DEVICE) MISC Check sugars daily 90 each 0  ?  linaclotide (LINZESS) 72 MCG capsule Take by mouth.    ? lisinopril-hydrochlorothiazide (ZESTORETIC) 20-12.5 MG tablet TAKE 1 TABLET EVERY DAY 90 tablet 1  ? metFORMIN (GLUCOPHAGE-XR) 500 MG 24 hr tablet TAKE 1 TABLET EVERY DAY 90 tablet 1  ? methylPREDNISolone (MEDROL DOSEPAK) 4 MG TBPK tablet Take by mouth.    ? Multiple Vitamin (MULTIVITAMIN WITH MINERALS) TABS tablet Take 1 tablet by mouth daily after breakfast.     ? simvastatin (ZOCOR) 40 MG tablet TAKE 1 TABLET EVERY DAY 90 tablet 1  ? solifenacin (VESICARE) 5 MG tablet TAKE 1 TABLET EVERY DAY 90 tablet 1  ? TRUE METRIX BLOOD GLUCOSE TEST test strip SMARTSIG:Via Meter    ? TRUEplus Lancets 33G MISC 1 each by Does not apply route 2 (two) times daily. 200 each 4  ? ?No current facility-administered  medications for this visit.  ? ?Facility-Administered Medications Ordered in Other Visits  ?Medication Dose Route Frequency Provider Last Rate Last Admin  ? sodium chloride flush (NS) 0.9 % injection 10 mL  10 mL Intracatheter PRN Curt Bears, MD   10 mL at 09/04/19 1556  ? ? ?SURGICAL HISTORY:  ?Past Surgical History:  ?Procedure Laterality Date  ? BRONCHIAL BIOPSY  08/20/2019  ? Procedure: BRONCHIAL BIOPSIES;  Surgeon: Collene Gobble, MD;  Location: [facility] ENDOSCOPY;  Service: Pulmonary;;  ? BRONCHIAL BRUSHINGS  08/20/2019  ? Procedure: BRONCHIAL BRUSHINGS;  Surgeon: Collene Gobble, MD;  Location: [facility] ENDOSCOPY;  Service: Pulmonary;;  ? BRONCHIAL NEEDLE ASPIRATION BIOPSY  08/20/2019  ? Procedure: BRONCHIAL NEEDLE ASPIRATION BIOPSIES;  Surgeon: Collene Gobble, MD;  Location: [facility] ENDOSCOPY;  Service: Pulmonary;;  ? CARPAL TUNNEL RELEASE Right 01/09/2019  ? Procedure: CARPAL TUNNEL RELEASE;  Surgeon: Leandrew Koyanagi, MD;  Location: [place];  Service: Orthopedics;  Laterality: Right;  ? CARPAL TUNNEL WITH CUBITAL TUNNEL Right 11/18/2020  ? Procedure: CARPAL TUNNEL WITH CUBITAL TUNNEL;  Surgeon: Charlotte Crumb, MD;  Location: [place] [facility];  Service: Orthopedics;  Laterality: Right;  ? CATARACT EXTRACTION Right 2018  ? COLONOSCOPY  2007  ? Dr. Benson Norway  ? DIRECT LARYNGOSCOPY N/A 04/10/2020  ? Procedure: DIRECT LARYNGOSCOPY;  Surgeon: Melida Quitter, MD;  Location: [facility];  Service: ENT;  Laterality: N/A;  ? I & D EXTREMITY Right 04/02/2016  ? Procedure: IRRIGATION AND DEBRIDEMENT GREAT TOE;  Surgeon: Leandrew Koyanagi, MD;  Location: [facility];  Service: Orthopedics;  Laterality: Right;  ? IR IMAGING GUIDED PORT INSERTION  08/30/2019  ? MULTIPLE TOOTH EXTRACTIONS    ? RADIOACTIVE SEED IMPLANT  2003  ? TRACHEOSTOMY TUBE PLACEMENT N/A 04/10/2020  ? Procedure: TRACHEOSTOMY;  Surgeon: Melida Quitter, MD;  Location: [facility];  Service: ENT;  Laterality: N/A;  ? ULNAR TUNNEL RELEASE Right 01/09/2019  ? Procedure: RIGHT CUBITAL TUNNEL RELEASE  AND CARPAL TUNNEL RELEASE;  Surgeon: Leandrew Koyanagi, MD;  Location: [facility];  Service: Orthopedics;  Laterality: Right;  ? UPPER GASTROINTESTINAL ENDOSCOPY    ? VIDEO BRONCHOSCOPY N/A 12/18/2019  ? Procedure: VIDEO BRONCHOSCOPY WITHOUT FLUORO;  Surgeon: Collene Gobble, MD;  Location: [facility]OSCOPY;  Service: Cardiopulmonary;  Laterality: N/A;  ? VIDEO BRONCHOSCOPY WITH ENDOBRONCHIAL ULTRASOUND N/A 08/20/2019  ? Procedure: VIDEO BRONCHOSCOPY WITH ENDOBRONCHIAL ULTRASOUND;  Surgeon: Collene Gobble, MD;  Location: [facility] ENDOSCOPY;  Service: Pulmonary;  Laterality: N/A;  ? ? ?REVIEW OF SYSTEMS:  Constitutional: positive for fatigue ?Eyes: negative ?Ears, nose, mouth, throat, and face: negative ?Respiratory: negative ?Cardiovascular: negative ?Gastrointestinal: negative ?Genitourinary:negative ?  Integument/breast: negative ?Hematologic/lymphatic: negative ?Musculoskeletal:negative ?Neurological: negative ?Behavioral/Psych: negative ?Endocrine: negative ?Allergic/Immunologic: negative  ? ?PHYSICAL EXAMINATION: General appearance: alert, cooperative, fatigued, and no distress ?Head: Normocephalic, without obvious abnormality, atraumatic ?Neck: no adenopathy, no JVD, supple, symmetrical, trachea midline, and thyroid not enlarged, symmetric, no tenderness/mass/nodules ?Lymph nodes: Cervical, supraclavicular, and axillary nodes normal. ?Resp: clear to auscultation bilaterally ?Back: symmetric, no curvature. ROM normal. No CVA tenderness. ?Cardio: regular rate and rhythm, S1, S2 normal, no murmur, click, rub or gallop ?GI: soft, non-tender; bowel sounds normal; no masses,  no organomegaly ?Extremities: extremities normal, atraumatic, no cyanosis or edema ?Neurologic: Alert and oriented X 3, normal strength and tone. Normal symmetric reflexes. Normal coordination and gait ? ?ECOG PERFORMANCE STATUS: 1 - Symptomatic but completely ambulatory ? ?Blood pressure 132/65, pulse (!) 58, temperature 97.8 ?F (36.6 ?C),  temperature source Tympanic, resp. rate 16, weight 182 lb 7 oz (82.8 kg), SpO2 95 %. ? ?LABORATORY DATA: ?Lab Results  ?Component Value Date  ? WBC 5.9 06/09/2021  ? HGB 10.4 (L) 06/09/2021  ? HCT 30.7 (L)

## 2021-06-09 NOTE — Progress Notes (Signed)
Ipilimumab (YERVOY) Patient Monitoring Assessment  ? ?Is the patient experiencing any of the following general symptoms?:  ?[ ] Difficulty performing normal activities ?[ ] Feeling sluggish or cold all the time ?[ ] Unusual weight gain ?[ ] Constant or unusual headaches ?[ ] Feeling dizzy or faint ?[ ] Changes in eyesight (blurry vision, double vision, or other vision problems) ?[ ] Changes in mood or behavior (ex: decreased sex drive, irritability, or forgetfulness) ?[ ] Starting new medications (ex: steroids, other medications that lower immune response) ?[ - ]Patient is not experiencing any of the general symptoms above.  ? ?Gastrointestinal  ?Patient is having 1 bowel movements each day.  ?Is this different from baseline? [ ] Yes [ - ]No ?Are your stools watery or do they have a foul smell? [ ] Yes [ -]No ?Have you seen blood in your stools? [ ] Yes [ -]No ?Are your stools dark, tarry, or sticky? [ ] Yes [ -]No ?Are you having pain or tenderness in your belly? [ ] Yes [- ]No ? ?Skin ?Does your skin itch? [ ] Yes [ -]No ?Do you have a rash? [ ] Yes [ -]No ?Has your skin blistered and/or peeled? [ ] Yes [ -]No ?Do you have sores in your mouth? [ ] Yes [- ]No ? ?Hepatic ?Has your urine been dark or tea colored? [ ] Yes [ -]No ?Have you noticed that your skin or the whites of your eyes are turning yellow? [ ] Yes [ -]No ?Are you bleeding or bruising more easily than normal? [ ] Yes [ -]No ?Are you nauseous and/or vomiting? [ ] Yes [- ]No ?Do you have pain on the right side of your stomach? [ ] Yes [- ]No ? ?Neurologic  ?Are you having unusual weakness of legs, arms, or face? [ ] Yes [ -]No ?Are you having numbness or tingling in your hands or feet? [ -]Yes [ ] No, per pt in the R Hand, this is baseline, MD aware. ? ?[place] Poot ? ? ?

## 2021-06-10 ENCOUNTER — Telehealth: Payer: Self-pay

## 2021-06-10 NOTE — Telephone Encounter (Signed)
Pt called back and states that he does not need the zofran  ?

## 2021-06-10 NOTE — Telephone Encounter (Signed)
Received a fax from [facility] requesting to fill pt ondansetron. Called pt to find out if he needed his zofran filled . Pt advised he is not in need of this script right now. [place] ?

## 2021-06-15 DIAGNOSIS — G5622 Lesion of ulnar nerve, left upper limb: Secondary | ICD-10-CM | POA: Diagnosis not present

## 2021-06-15 DIAGNOSIS — G5603 Carpal tunnel syndrome, bilateral upper limbs: Secondary | ICD-10-CM | POA: Diagnosis not present

## 2021-06-17 ENCOUNTER — Other Ambulatory Visit: Payer: Self-pay

## 2021-06-17 NOTE — Patient Outreach (Signed)
[facility]) Care Management ? ?06/17/2021 ? ?Shaun Kennedy ?1940-05-13 ?[id number] ? ? ?Telephone Assessment ? ? ?Successful outreach call to patient. Spoke with spouse. She reports patient is doing fairly well. No new issues or concerns at present. He continues to reside in home with supportive spouse and adult children that live nearby assist him as well. No recent falls. Appetite remains good. Wgt stable. Denies any SOB. Patient only using oxygen as needed. No recent issue with trach. Patient saw oncologist a few wks ago and continues to get txs about every 3 wks and tolerating them. Denies any RN CM needs or concerns at this time. ? ? ?Medications Reviewed Today   ? ? Reviewed by [name], RN (Registered Nurse) on 06/17/21 at 1024  Med List Status: <None>  ? ?Medication Order Taking? Sig Documenting Provider Last Dose Status Informant  ?Alcohol Swabs (DROPSAFE ALCOHOL PREP) 70 % PADS [id number] No Apply topically. [provider] Taking Active Spouse/Significant Other  ?amLODipine (NORVASC) 5 MG tablet [id number] No TAKE 1 TABLET EVERY DAY [name], MD Taking Active   ?aspirin EC 81 MG tablet [id number] No Take 81 mg by mouth daily after breakfast.  [provider] Taking Active Spouse/Significant Other  ?bisacodyl (DULCOLAX) 5 MG EC tablet [id number] No Take 10 mg by mouth daily as needed for moderate constipation. [provider] Taking Active Spouse/Significant Other  ?Blood Glucose Calibration (TRUE METRIX LEVEL 1) Low SOLN [id number] No  [provider] Taking Active Spouse/Significant Other  ?cholecalciferol (VITAMIN D) 25 MCG (1000 UNIT) tablet [id number] No Take 1,000 Units by mouth daily. [provider] Taking Active Spouse/Significant Other  ?dronabinol (MARINOL) 2.5 MG capsule [id number] No Take 1 capsule (2.5 mg total) by mouth 2 (two) times daily before a meal. [name], MD Taking Active   ?ferrous sulfate 325  (65 FE) MG EC tablet [id number] No TAKE 1 TABLET (325 MG TOTAL) DAILY WITH BREAKFAST. [name], MD Taking Active Spouse/Significant Other  ?gabapentin (NEURONTIN) 100 MG capsule [id number] No Take by mouth. [provider] Taking Active   ?Lancet Devices (TRUEDRAW LANCING DEVICE) MISC [id number] No Check sugars daily [name], MD Taking Active Spouse/Significant Other  ?linaclotide (LINZESS) 72 MCG capsule [id number] No Take by mouth. [provider] Taking Active   ?lisinopril-hydrochlorothiazide (ZESTORETIC) 20-12.5 MG tablet [id number] No TAKE 1 TABLET EVERY DAY [name], MD Taking Active   ?metFORMIN (GLUCOPHAGE-XR) 500 MG 24 hr tablet [id number] No TAKE 1 TABLET EVERY DAY [name], MD Taking Active Spouse/Significant Other  ?methylPREDNISolone (MEDROL DOSEPAK) 4 MG TBPK tablet [id number] No Take by mouth. [provider] Taking Active   ?Multiple Vitamin (MULTIVITAMIN WITH MINERALS) TABS tablet [id number] No Take 1 tablet by mouth daily after breakfast.  [provider] Taking Active Spouse/Significant Other  ?simvastatin (ZOCOR) 40 MG tablet [id number] No TAKE 1 TABLET EVERY DAY [name], MD Taking Active   ?solifenacin (VESICARE) 5 MG tablet [id number] No TAKE 1 TABLET EVERY DAY [name], MD Taking Active Spouse/Significant Other  ?TRUE METRIX BLOOD GLUCOSE TEST test strip [id number] No SMARTSIG:Via Meter [provider] Taking Active Spouse/Significant Other  ?TRUEplus Lancets 33G MISC [id number] No 1 each by Does not apply route 2 (two) times daily. [name], MD Taking Active Spouse/Significant Other  ? ?  ?  ? ?  ?  ? ?Care Plan : RN Care Manager POC  ?Updates made by [name], RN since  06/17/2021 12:00 AM  ?  ? ?Problem: Chronic Disease Mgmt of Chronic Condition-lung CA, DM   ?Priority: High  ?  ? ?Long-Range Goal: Development of POC for Mgmt of Chronic Condition-DM   ?Start Date: 02/17/2021  ?Expected End  Date: 02/17/2022  ?This Visit's Progress: On track  ?Recent Progress: On track  ?Priority: High  ?Note:   ? ?Current Barriers:  ?Chronic Disease Management support and education needs related to DMII  ? ?RNCM Clinical Goal(s):  ?Patient will verbalize understanding of plan for management of DMII as evidenced by mgmt of chronic condition ?demonstrate Ongoing health management independence as evidenced by A1C level within target range ?continue to work with RN Care Manager to address care management and care coordination needs related to  DMII as evidenced by adherence to CM Team Scheduled appointments through collaboration with RN Care manager, provider, and care team.  ? ?Interventions: ?POC sent to PCP upon initial assessment, quarterly and with any changes in patient's conditions ?Inter-disciplinary care team collaboration (see longitudinal plan of care) ?Evaluation of current treatment plan related to  self management and patient's adherence to plan as established by provider ? ? ?Diabetes Interventions:  (Status:  Goal on track:  Yes.) Long Term Goal ?Assessed patient's understanding of A1c goal: <7% ?Reviewed medications with patient and discussed importance of medication adherence ?Discussed plans with patient for ongoing care management follow up and provided patient with direct contact information for care management team ?Advised patient, providing education and rationale, to check cbg as ordered and record, calling MD for findings outside established parameters ?Lab Results  ?Component Value Date  ? HGBA1C 6.0 (A) 07/30/2020  ?06/17/21-Spouse reports patient's appetite remains good. Wgt stale. Blood sugars ranging in the low 100s. Blood sugar this morning was 104. ? ?Patient Goals/Self-Care Activities: ?Take all medications as prescribed ?Attend all scheduled provider appointments ?Call provider office for new concerns or questions  ?schedule appointment with eye doctor ?check feet daily for cuts, sores or  redness ?take the blood sugar log to all doctor visits ? ?Follow Up Plan:  Telephone follow up appointment with care management team member scheduled for:  within the month of Aug ?The patient has been provided with contact information for the care management team and has been advised to call with any health related questions or concerns.   ?  ? ?Long-Range Goal: Development of POC for Mgmt of Chronic Condition-Cancer   ?Start Date: 02/17/2021  ?Expected End Date: 02/17/2022  ?This Visit's Progress: On track  ?Recent Progress: On track  ?Priority: High  ?Note:   ?Current Barriers:  ?Chronic Disease Management support and education needs related to Cancer(lung)  ? ?RNCM Clinical Goal(s):  ?Patient will verbalize understanding of plan for management of cancer as evidenced by mgmt of chronic condition ?take all medications exactly as prescribed and will call provider for medication related questions as evidenced by med adherence/compliance ?attend all scheduled medical appointments: including cancer tx appts as evidenced by completion of chemo/radiation tx sessions ?continue to work with RN Care Manager to address care management and care coordination needs related to  cancer as evidenced by adherence to CM Team Scheduled appointments through collaboration with RN Care manager, provider, and care team.  ? ?Interventions: ?POC sent to PCP upon initial assessment, quarterly and with any changes in patient's conditions ?Inter-disciplinary care team collaboration (see longitudinal plan of care) ?Evaluation of current treatment plan related to  self management and patient's adherence to plan as established by provider ? ? ?Oncology:  (  Status: Goal on track:  Yes.) Long Term Goal ?Assessment of understanding of oncology diagnosis:  ?Assessed patient understanding of cancer diagnosis and recommended treatment plan, Reviewed upcoming provider appointments and treatment appointments, Assessed support system. Has  consistent/reliable family or other support: Yes, and Nutrition assessment performed ? ? ?04/13/21-Patient reports txs going well. He is going very Wed for txs and tolerating them. Denies any SEs from txs at present.  ?5/1

## 2021-06-28 NOTE — Progress Notes (Unsigned)
[name], [address] [place] [address]  DIAGNOSIS: Stage IV (T3, N2, M1 a) non-small cell lung cancer, squamous cell carcinoma presented with obstructive left lower lobe lung mass in addition to mediastinal lymphadenopathy as well as bilateral pulmonary nodules diagnosed in July 2021.   PDL1: 0%  PRIOR THERAPY:  Palliative radiotherapy to the obstructive left lower lobe lung mass under the care of Dr. Lisbeth Renshaw. Completed on 09/20/19  CURRENT THERAPY:  Systemic chemotherapy 2 cycles of chemotherapy with carboplatin for an AUC of 5 and paclitaxel 175 mg/m2 in addition to immunotherapy with nivolumab 360 mg every 3 weeks and ipilimumab 1 mg/kg IV every 6 weeks followed by maintenance nivolumab and ipilimumab.  He started the first treatment on 09/04/2019.  He is status post 16 cycles of treatment.   INTERVAL HISTORY: Shaun Kennedy 81 y.o. male returns to the clinic today for a follow up visit. The patient is feeling well today without any concerning complaints. He follows closely with ENT for his tracheostomy. He sees them next week on ***.  The patient continues to tolerate treatment with immunotherapy well without any adverse side effects. Denies any fever, chills, night sweats, or weight loss. Denies any chest pain or hemoptysis. He sometimes has a cough related to mucus production. He has been taking mucinex. He denies changes with his shortness of breath. Denies any nausea, vomiting, constipation, or diarrhea. Denies any headache or visual changes. Denies any rashes or skin changes. The patient is here today for evaluation prior to starting cycle #16 day 22.   MEDICAL HISTORY: Past Medical History:  Diagnosis Date   Allergic rhinitis    Asthma    Carotid stenosis    Colon cancer ([facility]) 2003   Diabetes mellitus ([facility] [facility])    Diverticulosis    Dyslipidemia    ED (erectile dysfunction)    GERD (gastroesophageal reflux disease)     H/O degenerative disc disease    Hemorrhoids    HTN (hypertension)    as a child   Hyperlipidemia    Lung cancer ([facility])    LVH (left ventricular hypertrophy)    on EKG   Mass of lower lobe of left lung    Mediastinal adenopathy    Smoker    former   Wears dentures    Wears dentures    Wears glasses    Wears glasses     ALLERGIES:  has No Known Allergies.  MEDICATIONS:  Current Outpatient Medications  Medication Sig Dispense Refill   Alcohol Swabs (DROPSAFE ALCOHOL PREP) 70 % PADS Apply topically.     amLODipine (NORVASC) 5 MG tablet TAKE 1 TABLET EVERY DAY 90 tablet 3   aspirin EC 81 MG tablet Take 81 mg by mouth daily after breakfast.      bisacodyl (DULCOLAX) 5 MG EC tablet Take 10 mg by mouth daily as needed for moderate constipation.     Blood Glucose Calibration (TRUE METRIX LEVEL 1) Low SOLN      cholecalciferol (VITAMIN D) 25 MCG (1000 UNIT) tablet Take 1,000 Units by mouth daily.     dronabinol (MARINOL) 2.5 MG capsule Take 1 capsule (2.5 mg total) by mouth 2 (two) times daily before a meal. 60 capsule 1   ferrous sulfate 325 (65 FE) MG EC tablet TAKE 1 TABLET (325 MG TOTAL) DAILY WITH BREAKFAST. 90 tablet 1   gabapentin (NEURONTIN) 100 MG capsule Take by mouth.     Lancet  Devices (TRUEDRAW LANCING DEVICE) MISC Check sugars daily 90 each 0   linaclotide (LINZESS) 72 MCG capsule Take by mouth.     lisinopril-hydrochlorothiazide (ZESTORETIC) 20-12.5 MG tablet TAKE 1 TABLET EVERY DAY 90 tablet 1   metFORMIN (GLUCOPHAGE-XR) 500 MG 24 hr tablet TAKE 1 TABLET EVERY DAY 90 tablet 1   methylPREDNISolone (MEDROL DOSEPAK) 4 MG TBPK tablet Take by mouth.     Multiple Vitamin (MULTIVITAMIN WITH MINERALS) TABS tablet Take 1 tablet by mouth daily after breakfast.      simvastatin (ZOCOR) 40 MG tablet TAKE 1 TABLET EVERY DAY 90 tablet 1   solifenacin (VESICARE) 5 MG tablet TAKE 1 TABLET EVERY DAY 90 tablet 1   TRUE METRIX BLOOD GLUCOSE TEST test strip SMARTSIG:Via Meter      TRUEplus Lancets 33G MISC 1 each by Does not apply route 2 (two) times daily. 200 each 4   No current facility-administered medications for this visit.   Facility-Administered Medications Ordered in Other Visits  Medication Dose Route Frequency Provider Last Rate Last Admin   sodium chloride flush (NS) 0.9 % injection 10 mL  10 mL Intracatheter PRN Curt Bears, MD   10 mL at 09/04/19 1556    SURGICAL HISTORY:  Past Surgical History:  Procedure Laterality Date   BRONCHIAL BIOPSY  08/20/2019   Procedure: BRONCHIAL BIOPSIES;  Surgeon: Collene Gobble, MD;  Location: [facility] ENDOSCOPY;  Service: Pulmonary;;   BRONCHIAL BRUSHINGS  08/20/2019   Procedure: BRONCHIAL BRUSHINGS;  Surgeon: Collene Gobble, MD;  Location: [facility] ENDOSCOPY;  Service: Pulmonary;;   BRONCHIAL NEEDLE ASPIRATION BIOPSY  08/20/2019   Procedure: BRONCHIAL NEEDLE ASPIRATION BIOPSIES;  Surgeon: Collene Gobble, MD;  Location: [facility];  Service: Pulmonary;;   CARPAL TUNNEL RELEASE Right 01/09/2019   Procedure: CARPAL TUNNEL RELEASE;  Surgeon: Leandrew Koyanagi, MD;  Location: [facility];  Service: Orthopedics;  Laterality: Right;   CARPAL TUNNEL WITH CUBITAL TUNNEL Right 11/18/2020   Procedure: CARPAL TUNNEL WITH CUBITAL TUNNEL;  Surgeon: Charlotte Crumb, MD;  Location: [facility];  Service: Orthopedics;  Laterality: Right;   CATARACT EXTRACTION Right 2018   COLONOSCOPY  2007   Dr. Benson Norway   DIRECT LARYNGOSCOPY N/A 04/10/2020   Procedure: DIRECT LARYNGOSCOPY;  Surgeon: Melida Quitter, MD;  Location: [facility];  Service: ENT;  Laterality: N/A;   I & D EXTREMITY Right 04/02/2016   Procedure: IRRIGATION AND DEBRIDEMENT GREAT TOE;  Surgeon: Leandrew Koyanagi, MD;  Location: [facility];  Service: Orthopedics;  Laterality: Right;   IR IMAGING GUIDED PORT INSERTION  08/30/2019   MULTIPLE TOOTH EXTRACTIONS     RADIOACTIVE SEED IMPLANT  2003   TRACHEOSTOMY TUBE PLACEMENT N/A 04/10/2020   Procedure: TRACHEOSTOMY;  Surgeon: Melida Quitter, MD;   Location: [facility];  Service: ENT;  Laterality: N/A;   ULNAR TUNNEL RELEASE Right 01/09/2019   Procedure: RIGHT CUBITAL TUNNEL RELEASE AND CARPAL TUNNEL RELEASE;  Surgeon: Leandrew Koyanagi, MD;  Location: [facility];  Service: Orthopedics;  Laterality: Right;   UPPER GASTROINTESTINAL ENDOSCOPY     VIDEO BRONCHOSCOPY N/A 12/18/2019   Procedure: VIDEO BRONCHOSCOPY WITHOUT FLUORO;  Surgeon: Collene Gobble, MD;  Location: [facility] ENDOSCOPY;  Service: Cardiopulmonary;  Laterality: N/A;   VIDEO BRONCHOSCOPY WITH ENDOBRONCHIAL ULTRASOUND N/A 08/20/2019   Procedure: VIDEO BRONCHOSCOPY WITH ENDOBRONCHIAL ULTRASOUND;  Surgeon: Collene Gobble, MD;  Location: [facility] ENDOSCOPY;  Service: Pulmonary;  Laterality: N/A;    REVIEW OF SYSTEMS:   Review of Systems  Constitutional: Negative  for appetite change, chills, fatigue, fever and unexpected weight change.  HENT:   Negative for mouth sores, nosebleeds, sore throat and trouble swallowing.   Eyes: Negative for eye problems and icterus.  Respiratory: Negative for cough, hemoptysis, shortness of breath and wheezing.   Cardiovascular: Negative for chest pain and leg swelling.  Gastrointestinal: Negative for abdominal pain, constipation, diarrhea, nausea and vomiting.  Genitourinary: Negative for bladder incontinence, difficulty urinating, dysuria, frequency and hematuria.   Musculoskeletal: Negative for back pain, gait problem, neck pain and neck stiffness.  Skin: Negative for itching and rash.  Neurological: Negative for dizziness, extremity weakness, gait problem, headaches, light-headedness and seizures.  Hematological: Negative for adenopathy. Does not bruise/bleed easily.  Psychiatric/Behavioral: Negative for confusion, depression and sleep disturbance. The patient is not nervous/anxious.     PHYSICAL EXAMINATION:  There were no vitals taken for this visit.  ECOG PERFORMANCE STATUS: {CHL ONC ECOG [id number]  Physical Exam  Constitutional:  Oriented to person, place, and time and well-developed, well-nourished, and in no distress. No distress.  HENT:  Head: Normocephalic and atraumatic.  Mouth/Throat: Oropharynx is clear and moist. No oropharyngeal exudate.  Eyes: Conjunctivae are normal. Right eye exhibits no discharge. Left eye exhibits no discharge. No scleral icterus.  Neck: Normal range of motion. Neck supple.  Cardiovascular: Normal rate, regular rhythm, normal heart sounds and intact distal pulses.   Pulmonary/Chest: Effort normal and breath sounds normal. No respiratory distress. No wheezes. No rales.  Abdominal: Soft. Bowel sounds are normal. Exhibits no distension and no mass. There is no tenderness.  Musculoskeletal: Normal range of motion. Exhibits no edema.  Lymphadenopathy:    No cervical adenopathy.  Neurological: Alert and oriented to person, place, and time. Exhibits normal muscle tone. Gait normal. Coordination normal.  Skin: Skin is warm and dry. No rash noted. Not diaphoretic. No erythema. No pallor.  Psychiatric: Mood, memory and judgment normal.  Vitals reviewed.  LABORATORY DATA: Lab Results  Component Value Date   WBC 5.9 06/09/2021   HGB 10.4 (L) 06/09/2021   HCT 30.7 (L) 06/09/2021   MCV 93.3 06/09/2021   PLT 255 06/09/2021      Chemistry      Component Value Date/Time   NA 139 06/09/2021 0948   NA 132 (L) 08/14/2019 0918   K 3.6 06/09/2021 0948   CL 108 06/09/2021 0948   CO2 25 06/09/2021 0948   BUN 16 06/09/2021 0948   BUN 16 08/14/2019 0918   CREATININE 1.19 06/09/2021 0948   CREATININE 1.25 (H) 08/08/2016 0912      Component Value Date/Time   CALCIUM 8.8 (L) 06/09/2021 0948   ALKPHOS 58 06/09/2021 0948   AST 19 06/09/2021 0948   ALT 17 06/09/2021 0948   BILITOT 0.6 06/09/2021 0948       RADIOGRAPHIC STUDIES:  CT Chest W Contrast  Result Date: 06/07/2021 CLINICAL DATA:  Non-small-cell lung cancer. Restaging. * Tracking Code: BO * EXAM: CT CHEST, ABDOMEN, AND PELVIS WITH  CONTRAST TECHNIQUE: Multidetector CT imaging of the chest, abdomen and pelvis was performed following the standard protocol during bolus administration of intravenous contrast. RADIATION DOSE REDUCTION: This exam was performed according to the departmental dose-optimization program which includes automated exposure control, adjustment of the mA and/or kV according to patient size and/or use of iterative reconstruction technique. CONTRAST:  118m OMNIPAQUE IOHEXOL 300 MG/ML  SOLN COMPARISON:  03/02/2021 FINDINGS: CT CHEST FINDINGS Cardiovascular: The heart size is normal. No substantial pericardial effusion. Coronary artery calcification is evident. Mild atherosclerotic  calcification is noted in the wall of the thoracic aorta. Right Port-A-Cath tip is in the distal SVC. Mediastinum/Nodes: Tracheostomy tube visualized in situ. No mediastinal lymphadenopathy. There is no hilar lymphadenopathy. The esophagus has normal imaging features. There is no axillary lymphadenopathy. Lungs/Pleura: Centrilobular and paraseptal emphysema evident. Suprahilar and parahilar scarring in the left lung is compatible with radiation therapy. Appearance is stable in the interval without progression to suggest local recurrence. Tiny bilateral pulmonary nodules again noted. Index 8 mm nodule measured previously in the posterior left lower lobe is stable at 7 mm today (86/4). There is no new suspicious pulmonary nodule or mass. No focal airspace consolidation. No pleural effusion. Musculoskeletal: No worrisome lytic or sclerotic osseous abnormality. CT ABDOMEN PELVIS FINDINGS Hepatobiliary: No suspicious focal abnormality within the liver parenchyma. Gallbladder is decompressed. No intrahepatic or extrahepatic biliary dilation. Pancreas: No focal mass lesion. No dilatation of the main duct. No intraparenchymal cyst. No peripancreatic edema. Ductal anatomy in the head of the pancreas is consistent with pancreas divisum. Spleen: No splenomegaly.  No focal mass lesion. Adrenals/Urinary Tract: No adrenal nodule or mass. Stable small cyst upper interpolar right kidney. No followup recommended. Left kidney unremarkable. No evidence for hydroureter. The urinary bladder appears normal for the degree of distention. Stomach/Bowel: Stomach is unremarkable. No gastric wall thickening. No evidence of outlet obstruction. Duodenum is normally positioned as is the ligament of Treitz. No small bowel wall thickening. No small bowel dilatation. The terminal ileum is normal. The appendix is not well visualized, but there is no edema or inflammation in the region of the cecum. Large stool volume noted ascending and transverse colon. Diverticular disease characterized as the sigmoid colon without diverticulitis Vascular/Lymphatic: There is moderate atherosclerotic calcification of the abdominal aorta without aneurysm. There is no gastrohepatic or hepatoduodenal ligament lymphadenopathy. No retroperitoneal or mesenteric lymphadenopathy. No pelvic sidewall lymphadenopathy. Reproductive: Brachytherapy seeds noted in the prostate parenchyma. Other: No intraperitoneal free fluid. Musculoskeletal: Small left groin hernia contains only fat. Fluid is identified in a right groin hernia, decreased in the interval now showing a peripheral rim of soft tissue attenuation, nonspecific. This does not appear to be herniation of bladder into the right groin region. No worrisome lytic or sclerotic osseous abnormality. IMPRESSION: 1. Stable exam. No new or progressive findings to suggest recurrent or metastatic disease. 2. Stable appearance of radiation therapy changes in the left lung. 3. Stable tiny bilateral pulmonary nodules. No new or progressive pulmonary nodule or mass. 4. Large stool volume. Imaging features could be compatible with clinical constipation. 5. Small left groin hernia contains only fat. 6. Fluid in a right groin hernia, volume is decreased in the interval with some  circumferential soft tissue attenuation around the fluid today, potentially representing a component of organization/peripheral granulation. This does not appear to be herniation of bladder into the right groin region. 7. Aortic Atherosclerosis (ICD10-I70.0) and Emphysema (ICD10-J43.9). Electronically Signed   By: Misty Stanley M.D.   On: 06/07/2021 08:07   CT Abdomen Pelvis W Contrast  Result Date: 06/07/2021 CLINICAL DATA:  Non-small-cell lung cancer. Restaging. * Tracking Code: BO * EXAM: CT CHEST, ABDOMEN, AND PELVIS WITH CONTRAST TECHNIQUE: Multidetector CT imaging of the chest, abdomen and pelvis was performed following the standard protocol during bolus administration of intravenous contrast. RADIATION DOSE REDUCTION: This exam was performed according to the departmental dose-optimization program which includes automated exposure control, adjustment of the mA and/or kV according to patient size and/or use of iterative reconstruction technique. CONTRAST:  118m OMNIPAQUE  IOHEXOL 300 MG/ML  SOLN COMPARISON:  03/02/2021 FINDINGS: CT CHEST FINDINGS Cardiovascular: The heart size is normal. No substantial pericardial effusion. Coronary artery calcification is evident. Mild atherosclerotic calcification is noted in the wall of the thoracic aorta. Right Port-A-Cath tip is in the distal SVC. Mediastinum/Nodes: Tracheostomy tube visualized in situ. No mediastinal lymphadenopathy. There is no hilar lymphadenopathy. The esophagus has normal imaging features. There is no axillary lymphadenopathy. Lungs/Pleura: Centrilobular and paraseptal emphysema evident. Suprahilar and parahilar scarring in the left lung is compatible with radiation therapy. Appearance is stable in the interval without progression to suggest local recurrence. Tiny bilateral pulmonary nodules again noted. Index 8 mm nodule measured previously in the posterior left lower lobe is stable at 7 mm today (86/4). There is no new suspicious pulmonary nodule  or mass. No focal airspace consolidation. No pleural effusion. Musculoskeletal: No worrisome lytic or sclerotic osseous abnormality. CT ABDOMEN PELVIS FINDINGS Hepatobiliary: No suspicious focal abnormality within the liver parenchyma. Gallbladder is decompressed. No intrahepatic or extrahepatic biliary dilation. Pancreas: No focal mass lesion. No dilatation of the main duct. No intraparenchymal cyst. No peripancreatic edema. Ductal anatomy in the head of the pancreas is consistent with pancreas divisum. Spleen: No splenomegaly. No focal mass lesion. Adrenals/Urinary Tract: No adrenal nodule or mass. Stable small cyst upper interpolar right kidney. No followup recommended. Left kidney unremarkable. No evidence for hydroureter. The urinary bladder appears normal for the degree of distention. Stomach/Bowel: Stomach is unremarkable. No gastric wall thickening. No evidence of outlet obstruction. Duodenum is normally positioned as is the ligament of Treitz. No small bowel wall thickening. No small bowel dilatation. The terminal ileum is normal. The appendix is not well visualized, but there is no edema or inflammation in the region of the cecum. Large stool volume noted ascending and transverse colon. Diverticular disease characterized as the sigmoid colon without diverticulitis Vascular/Lymphatic: There is moderate atherosclerotic calcification of the abdominal aorta without aneurysm. There is no gastrohepatic or hepatoduodenal ligament lymphadenopathy. No retroperitoneal or mesenteric lymphadenopathy. No pelvic sidewall lymphadenopathy. Reproductive: Brachytherapy seeds noted in the prostate parenchyma. Other: No intraperitoneal free fluid. Musculoskeletal: Small left groin hernia contains only fat. Fluid is identified in a right groin hernia, decreased in the interval now showing a peripheral rim of soft tissue attenuation, nonspecific. This does not appear to be herniation of bladder into the right groin region. No  worrisome lytic or sclerotic osseous abnormality. IMPRESSION: 1. Stable exam. No new or progressive findings to suggest recurrent or metastatic disease. 2. Stable appearance of radiation therapy changes in the left lung. 3. Stable tiny bilateral pulmonary nodules. No new or progressive pulmonary nodule or mass. 4. Large stool volume. Imaging features could be compatible with clinical constipation. 5. Small left groin hernia contains only fat. 6. Fluid in a right groin hernia, volume is decreased in the interval with some circumferential soft tissue attenuation around the fluid today, potentially representing a component of organization/peripheral granulation. This does not appear to be herniation of bladder into the right groin region. 7. Aortic Atherosclerosis (ICD10-I70.0) and Emphysema (ICD10-J43.9). Electronically Signed   By: Misty Stanley M.D.   On: 06/07/2021 08:07     ASSESSMENT/PLAN:  This is a very pleasant 81 year old [name] diagnosed with stage IV (T3, N2, M1a) non-small cell lung cancer, squamous cell carcinoma.  He presented with a left lower lobe obstructive lung mass in addition to mediastinal lymphadenopathy.  He also presented with bilateral pulmonary nodules.  He was diagnosed in July 2021.  His PD-L1  expression is negative.   He completed palliative radiotherapy to the obstructive lung mass under the care of Dr. Lisbeth Renshaw in August 2021.  The patient is currently undergoing systemic chemotherapy with carboplatin and paclitaxel for 2 cycles in addition to immunotherapy with ipilimumab 1 mg/KG every 6 weeks and nivolumab 360 mg IV every 3 weeks.  He is status post 15 cycles.    Labs were reviewed.  Recommend that he proceed with cycle 16 day 22 today scheduled.  We will see him back for follow-up visit in 3 weeks for evaluation before starting day 1 cycle 17.   He will continue to follow with ENT for his tracheostomy. He is scheduled to see them next week on ***  The  patient was advised to call immediately if he has any concerning symptoms in the interval. The patient voices understanding of current disease status and treatment options and is in agreement with the current care plan. All questions were answered. The patient knows to call the clinic with any problems, questions or concerns. We can certainly see the patient much sooner if necessary    No orders of the defined types were placed in this encounter.    I spent {CHL ONC TIME VISIT - IBBCW:[id number]} counseling the patient face to face. The total time spent in the appointment was {CHL ONC TIME VISIT - TUUEK:[id number]}.  Nylan Nakatani L Andreana Klingerman, PA-C 06/28/21

## 2021-06-30 ENCOUNTER — Inpatient Hospital Stay (HOSPITAL_BASED_OUTPATIENT_CLINIC_OR_DEPARTMENT_OTHER): Payer: Medicare HMO | Admitting: Physician Assistant

## 2021-06-30 ENCOUNTER — Inpatient Hospital Stay: Payer: Medicare HMO

## 2021-06-30 ENCOUNTER — Encounter: Payer: Self-pay | Admitting: Physician Assistant

## 2021-06-30 ENCOUNTER — Other Ambulatory Visit: Payer: Self-pay

## 2021-06-30 VITALS — BP 129/65 | HR 70 | Temp 97.8°F | Resp 16 | Wt 182.1 lb

## 2021-06-30 DIAGNOSIS — Z5112 Encounter for antineoplastic immunotherapy: Secondary | ICD-10-CM | POA: Diagnosis not present

## 2021-06-30 DIAGNOSIS — Z7984 Long term (current) use of oral hypoglycemic drugs: Secondary | ICD-10-CM | POA: Diagnosis not present

## 2021-06-30 DIAGNOSIS — Z95828 Presence of other vascular implants and grafts: Secondary | ICD-10-CM

## 2021-06-30 DIAGNOSIS — C3432 Malignant neoplasm of lower lobe, left bronchus or lung: Secondary | ICD-10-CM

## 2021-06-30 DIAGNOSIS — Z7982 Long term (current) use of aspirin: Secondary | ICD-10-CM | POA: Diagnosis not present

## 2021-06-30 DIAGNOSIS — Z79899 Other long term (current) drug therapy: Secondary | ICD-10-CM | POA: Diagnosis not present

## 2021-06-30 DIAGNOSIS — Z87891 Personal history of nicotine dependence: Secondary | ICD-10-CM | POA: Diagnosis not present

## 2021-06-30 LAB — CBC WITH DIFFERENTIAL (CANCER CENTER ONLY)
Abs Immature Granulocytes: 0.01 10*3/uL (ref 0.00–0.07)
Basophils Absolute: 0 10*3/uL (ref 0.0–0.1)
Basophils Relative: 0 %
Eosinophils Absolute: 1.1 10*3/uL — ABNORMAL HIGH (ref 0.0–0.5)
Eosinophils Relative: 17 %
HCT: 31 % — ABNORMAL LOW (ref 39.0–52.0)
Hemoglobin: 10.6 g/dL — ABNORMAL LOW (ref 13.0–17.0)
Immature Granulocytes: 0 %
Lymphocytes Relative: 23 %
Lymphs Abs: 1.4 10*3/uL (ref 0.7–4.0)
MCH: 32.3 pg (ref 26.0–34.0)
MCHC: 34.2 g/dL (ref 30.0–36.0)
MCV: 94.5 fL (ref 80.0–100.0)
Monocytes Absolute: 0.6 10*3/uL (ref 0.1–1.0)
Monocytes Relative: 9 %
Neutro Abs: 3.2 10*3/uL (ref 1.7–7.7)
Neutrophils Relative %: 51 %
Platelet Count: 223 10*3/uL (ref 150–400)
RBC: 3.28 MIL/uL — ABNORMAL LOW (ref 4.22–5.81)
RDW: 12.6 % (ref 11.5–15.5)
WBC Count: 6.2 10*3/uL (ref 4.0–10.5)
nRBC: 0 % (ref 0.0–0.2)

## 2021-06-30 LAB — CMP (CANCER CENTER ONLY)
ALT: 13 U/L (ref 0–44)
AST: 20 U/L (ref 15–41)
Albumin: 3.8 g/dL (ref 3.5–5.0)
Alkaline Phosphatase: 70 U/L (ref 38–126)
Anion gap: 4 — ABNORMAL LOW (ref 5–15)
BUN: 30 mg/dL — ABNORMAL HIGH (ref 8–23)
CO2: 27 mmol/L (ref 22–32)
Calcium: 8.6 mg/dL — ABNORMAL LOW (ref 8.9–10.3)
Chloride: 106 mmol/L (ref 98–111)
Creatinine: 1.58 mg/dL — ABNORMAL HIGH (ref 0.61–1.24)
GFR, Estimated: 43 mL/min — ABNORMAL LOW (ref >60)
Glucose, Bld: 148 mg/dL — ABNORMAL HIGH (ref 70–99)
Potassium: 4.1 mmol/L (ref 3.5–5.1)
Sodium: 137 mmol/L (ref 135–145)
Total Bilirubin: 0.6 mg/dL (ref 0.3–1.2)
Total Protein: 8.1 g/dL (ref 6.5–8.1)

## 2021-06-30 LAB — TSH: TSH: 1.555 u[IU]/mL (ref 0.350–4.500)

## 2021-06-30 MED ORDER — SODIUM CHLORIDE 0.9% FLUSH
10.0000 mL | Freq: Once | INTRAVENOUS | Status: AC
  Administered 2021-06-30: 10 mL

## 2021-06-30 MED ORDER — SODIUM CHLORIDE 0.9 % IV SOLN
360.0000 mg | Freq: Once | INTRAVENOUS | Status: AC
  Administered 2021-06-30: 360 mg via INTRAVENOUS
  Filled 2021-06-30: qty 24

## 2021-06-30 MED ORDER — SODIUM CHLORIDE 0.9 % IV SOLN: Freq: Once | INTRAVENOUS | Status: AC

## 2021-06-30 MED ORDER — SODIUM CHLORIDE 0.9% FLUSH
10.0000 mL | INTRAVENOUS | Status: DC | PRN
  Administered 2021-06-30: 10 mL

## 2021-06-30 MED ORDER — HEPARIN SOD (PORK) LOCK FLUSH 100 UNIT/ML IV SOLN
500.0000 [IU] | Freq: Once | INTRAVENOUS | Status: AC | PRN
  Administered 2021-06-30: 500 [IU]

## 2021-06-30 NOTE — Patient Instructions (Signed)
[facility] ONCOLOGY   Discharge Instructions: Thank you for choosing [facility] to provide your oncology and hematology care.   If you have a lab appointment with the [facility], please go directly to the [facility] and check in at the registration area.   Wear comfortable clothing and clothing appropriate for easy access to any Portacath or PICC line.   We strive to give you quality time with your provider. You may need to reschedule your appointment if you arrive late (15 or more minutes).  Arriving late affects you and other patients whose appointments are after yours.  Also, if you miss three or more appointments without notifying the office, you may be dismissed from the clinic at the provider's discretion.      For prescription refill requests, have your pharmacy contact our office and allow 72 hours for refills to be completed.    Today you received the following chemotherapy and/or immunotherapy agents: Nivolumab (Opdivo)      To help prevent nausea and vomiting after your treatment, we encourage you to take your nausea medication as directed.  BELOW ARE SYMPTOMS THAT SHOULD BE REPORTED IMMEDIATELY: *FEVER GREATER THAN 100.4 F (38 C) OR HIGHER *CHILLS OR SWEATING *NAUSEA AND VOMITING THAT IS NOT CONTROLLED WITH YOUR NAUSEA MEDICATION *UNUSUAL SHORTNESS OF BREATH *UNUSUAL BRUISING OR BLEEDING *URINARY PROBLEMS (pain or burning when urinating, or frequent urination) *BOWEL PROBLEMS (unusual diarrhea, constipation, pain near the anus) TENDERNESS IN MOUTH AND THROAT WITH OR WITHOUT PRESENCE OF ULCERS (sore throat, sores in mouth, or a toothache) UNUSUAL RASH, SWELLING OR PAIN  UNUSUAL VAGINAL DISCHARGE OR ITCHING   Items with * indicate a potential emergency and should be followed up as soon as possible or go to the Emergency Department if any problems should occur.  Please show the CHEMOTHERAPY ALERT CARD or IMMUNOTHERAPY ALERT CARD at  check-in to the Emergency Department and triage nurse.  Should you have questions after your visit or need to cancel or reschedule your appointment, please contact [facility]  Dept: [phone]  and follow the prompts.  Office hours are 8:00 a.m. to 4:30 p.m. Monday - Friday. Please note that voicemails left after 4:00 p.m. may not be returned until the following business day.  We are closed weekends and major holidays. You have access to a nurse at all times for urgent questions. Please call the main number to the clinic Dept: [phone] and follow the prompts.   For any non-urgent questions, you may also contact your provider using MyChart. We now offer e-Visits for anyone 12 and older to request care online for non-urgent symptoms. For details visit [email or web address].   Also download the MyChart app! Go to the app store, search "MyChart", open the app, select [facility], and log in with your MyChart username and password.  Due to Covid, a mask is required upon entering the hospital/clinic. If you do not have a mask, one will be given to you upon arrival. For doctor visits, patients may have 1 support person aged 74 or older with them. For treatment visits, patients cannot have anyone with them due to current Covid guidelines and our immunocompromised population.

## 2021-06-30 NOTE — Progress Notes (Signed)
Per Cassandra Heilingoetter, PA-C - okay to treat with elevated serum creatinine of 1.58. Patient encouraged to increase hydration.

## 2021-07-14 ENCOUNTER — Ambulatory Visit (INDEPENDENT_AMBULATORY_CARE_PROVIDER_SITE_OTHER): Payer: Medicare HMO | Admitting: Medical

## 2021-07-14 VITALS — BP 110/60 | HR 62 | Temp 97.3°F | Wt 181.4 lb

## 2021-07-14 DIAGNOSIS — J383 Other diseases of vocal cords: Secondary | ICD-10-CM | POA: Diagnosis not present

## 2021-07-14 DIAGNOSIS — L209 Atopic dermatitis, unspecified: Secondary | ICD-10-CM

## 2021-07-14 DIAGNOSIS — R21 Rash and other nonspecific skin eruption: Secondary | ICD-10-CM | POA: Diagnosis not present

## 2021-07-14 DIAGNOSIS — Z930 Tracheostomy status: Secondary | ICD-10-CM

## 2021-07-14 DIAGNOSIS — G5611 Other lesions of median nerve, right upper limb: Secondary | ICD-10-CM | POA: Diagnosis not present

## 2021-07-14 MED ORDER — HYDROXYZINE HCL 10 MG PO TABS: 10.0000 mg | ORAL_TABLET | Freq: Three times a day (TID) | ORAL | 1 refills | Status: DC | PRN

## 2021-07-14 MED ORDER — TRIAMCINOLONE ACETONIDE 0.1 % EX CREA: 1.0000 "application " | TOPICAL_CREAM | Freq: Two times a day (BID) | CUTANEOUS | 0 refills | Status: DC

## 2021-07-14 NOTE — Progress Notes (Signed)
Subjective:  Shaun Kennedy is a 81 y.o. male who presents for Chief Complaint  Patient presents with   rash    Rash on arms but itching all over. Rash popped Thursday.      Here for rash on bilat arms x almost a week.  No specific new triggers.   Has tried various creams OTC without success.   Has had eczema in the past.   No new plant, animal or food exposure.  otherwise been doing fine.  No other aggravating or relieving factors.    No other c/o.  The following portions of the patient's history were reviewed and updated as appropriate: allergies, current medications, past family history, past medical history, past social history, past surgical history and problem list.  ROS Otherwise as in subjective above    Objective: BP 110/60   Pulse 62   Temp (!) 97.3 F (36.3 C)   Wt 181 lb 6.4 oz (82.3 kg)   BMI 28.41 kg/m   General appearance: alert, no distress, well developed, well nourished Skin: pink/red urticarial appearing rash of bilat antecubital regions and round smaller rashes along anterior forearms.  No other rash noted Of note, he talks through tracheostomy   Assessment: Encounter Diagnoses  Name Primary?   Rash Yes   Atopic dermatitis, unspecified type    Tracheostomy status ([place])    Vocal cord dysfunction      Plan: Eczema vs allergic reaction confined to arms.  Begin medications below.  Avoid hot baths.  Use luke warm showers until resolved.  If not resolved or if new or worse symptoms in the next few days, then recheck.    [name] was seen today for rash.  Diagnoses and all orders for this visit:  Rash  Atopic dermatitis, unspecified type  Tracheostomy status ([place])  Vocal cord dysfunction  Other orders -     triamcinolone cream (KENALOG) 0.1 %; Apply 1 application. topically 2 (two) times daily. -     hydrOXYzine (ATARAX) 10 MG tablet; Take 1 tablet (10 mg total) by mouth 3 (three) times daily as needed.    Follow up: prn

## 2021-07-15 ENCOUNTER — Ambulatory Visit: Payer: Medicare HMO | Admitting: Family Medicine

## 2021-07-20 DIAGNOSIS — G5603 Carpal tunnel syndrome, bilateral upper limbs: Secondary | ICD-10-CM | POA: Diagnosis not present

## 2021-07-20 DIAGNOSIS — G5621 Lesion of ulnar nerve, right upper limb: Secondary | ICD-10-CM | POA: Diagnosis not present

## 2021-07-21 ENCOUNTER — Inpatient Hospital Stay: Payer: Medicare HMO

## 2021-07-21 ENCOUNTER — Inpatient Hospital Stay: Payer: Medicare HMO | Attending: Internal Medicine | Admitting: Internal Medicine

## 2021-07-21 ENCOUNTER — Other Ambulatory Visit: Payer: Self-pay

## 2021-07-21 ENCOUNTER — Encounter: Payer: Self-pay | Admitting: Internal Medicine

## 2021-07-21 VITALS — BP 138/57 | HR 66 | Temp 98.0°F | Resp 17 | Ht 67.0 in | Wt 182.0 lb

## 2021-07-21 DIAGNOSIS — Z85038 Personal history of other malignant neoplasm of large intestine: Secondary | ICD-10-CM | POA: Diagnosis not present

## 2021-07-21 DIAGNOSIS — Z87891 Personal history of nicotine dependence: Secondary | ICD-10-CM | POA: Insufficient documentation

## 2021-07-21 DIAGNOSIS — Z7984 Long term (current) use of oral hypoglycemic drugs: Secondary | ICD-10-CM | POA: Insufficient documentation

## 2021-07-21 DIAGNOSIS — Z7982 Long term (current) use of aspirin: Secondary | ICD-10-CM | POA: Insufficient documentation

## 2021-07-21 DIAGNOSIS — Z5112 Encounter for antineoplastic immunotherapy: Secondary | ICD-10-CM | POA: Diagnosis not present

## 2021-07-21 DIAGNOSIS — C3432 Malignant neoplasm of lower lobe, left bronchus or lung: Secondary | ICD-10-CM | POA: Diagnosis not present

## 2021-07-21 DIAGNOSIS — Z79899 Other long term (current) drug therapy: Secondary | ICD-10-CM | POA: Diagnosis not present

## 2021-07-21 DIAGNOSIS — Z95828 Presence of other vascular implants and grafts: Secondary | ICD-10-CM

## 2021-07-21 LAB — CBC WITH DIFFERENTIAL (CANCER CENTER ONLY)
Abs Immature Granulocytes: 0.01 10*3/uL (ref 0.00–0.07)
Basophils Absolute: 0 10*3/uL (ref 0.0–0.1)
Basophils Relative: 0 %
Eosinophils Absolute: 1 10*3/uL — ABNORMAL HIGH (ref 0.0–0.5)
Eosinophils Relative: 15 %
HCT: 31 % — ABNORMAL LOW (ref 39.0–52.0)
Hemoglobin: 10.5 g/dL — ABNORMAL LOW (ref 13.0–17.0)
Immature Granulocytes: 0 %
Lymphocytes Relative: 21 %
Lymphs Abs: 1.3 10*3/uL (ref 0.7–4.0)
MCH: 31.8 pg (ref 26.0–34.0)
MCHC: 33.9 g/dL (ref 30.0–36.0)
MCV: 93.9 fL (ref 80.0–100.0)
Monocytes Absolute: 0.5 10*3/uL (ref 0.1–1.0)
Monocytes Relative: 8 %
Neutro Abs: 3.6 10*3/uL (ref 1.7–7.7)
Neutrophils Relative %: 56 %
Platelet Count: 304 10*3/uL (ref 150–400)
RBC: 3.3 MIL/uL — ABNORMAL LOW (ref 4.22–5.81)
RDW: 12.1 % (ref 11.5–15.5)
WBC Count: 6.5 10*3/uL (ref 4.0–10.5)
nRBC: 0 % (ref 0.0–0.2)

## 2021-07-21 LAB — CMP (CANCER CENTER ONLY)
ALT: 14 U/L (ref 0–44)
AST: 16 U/L (ref 15–41)
Albumin: 3.7 g/dL (ref 3.5–5.0)
Alkaline Phosphatase: 61 U/L (ref 38–126)
Anion gap: 6 (ref 5–15)
BUN: 19 mg/dL (ref 8–23)
CO2: 28 mmol/L (ref 22–32)
Calcium: 9.3 mg/dL (ref 8.9–10.3)
Chloride: 106 mmol/L (ref 98–111)
Creatinine: 1.23 mg/dL (ref 0.61–1.24)
GFR, Estimated: 59 mL/min — ABNORMAL LOW (ref >60)
Glucose, Bld: 171 mg/dL — ABNORMAL HIGH (ref 70–99)
Potassium: 4 mmol/L (ref 3.5–5.1)
Sodium: 140 mmol/L (ref 135–145)
Total Bilirubin: 0.5 mg/dL (ref 0.3–1.2)
Total Protein: 8.1 g/dL (ref 6.5–8.1)

## 2021-07-21 LAB — TSH: TSH: 1 u[IU]/mL (ref 0.350–4.500)

## 2021-07-21 MED ORDER — DIPHENHYDRAMINE HCL 50 MG/ML IJ SOLN
25.0000 mg | Freq: Once | INTRAMUSCULAR | Status: AC
  Administered 2021-07-21: 25 mg via INTRAVENOUS
  Filled 2021-07-21: qty 1

## 2021-07-21 MED ORDER — SODIUM CHLORIDE 0.9% FLUSH
10.0000 mL | Freq: Once | INTRAVENOUS | Status: AC
  Administered 2021-07-21: 10 mL

## 2021-07-21 MED ORDER — SODIUM CHLORIDE 0.9 % IV SOLN: Freq: Once | INTRAVENOUS | Status: AC

## 2021-07-21 MED ORDER — FAMOTIDINE IN NACL 20-0.9 MG/50ML-% IV SOLN
20.0000 mg | Freq: Once | INTRAVENOUS | Status: AC
  Administered 2021-07-21: 20 mg via INTRAVENOUS
  Filled 2021-07-21: qty 50

## 2021-07-21 MED ORDER — HEPARIN SOD (PORK) LOCK FLUSH 100 UNIT/ML IV SOLN
500.0000 [IU] | Freq: Once | INTRAVENOUS | Status: AC | PRN
  Administered 2021-07-21: 500 [IU]

## 2021-07-21 MED ORDER — SODIUM CHLORIDE 0.9 % IV SOLN
360.0000 mg | Freq: Once | INTRAVENOUS | Status: AC
  Administered 2021-07-21: 360 mg via INTRAVENOUS
  Filled 2021-07-21: qty 24

## 2021-07-21 MED ORDER — SODIUM CHLORIDE 0.9 % IV SOLN
1.0000 mg/kg | Freq: Once | INTRAVENOUS | Status: AC
  Administered 2021-07-21: 80 mg via INTRAVENOUS
  Filled 2021-07-21: qty 16

## 2021-07-21 MED ORDER — SODIUM CHLORIDE 0.9% FLUSH
10.0000 mL | INTRAVENOUS | Status: DC | PRN
  Administered 2021-07-21: 10 mL

## 2021-07-21 NOTE — Progress Notes (Signed)
Ipilimumab (YERVOY) Patient Monitoring Assessment   Is the patient experiencing any of the following general symptoms?:  [ ] Difficulty performing normal activities [ ] Feeling sluggish or cold all the time [ ] Unusual weight gain [ ] Constant or unusual headaches [ ] Feeling dizzy or faint [ ] Changes in eyesight (blurry vision, double vision, or other vision problems) [ ] Changes in mood or behavior (ex: decreased sex drive, irritability, or forgetfulness) [ ] Starting new medications (ex: steroids, other medications that lower immune response) [ X ]Patient is not experiencing any of the general symptoms above.   Gastrointestinal  Patient is having 1 bowel movements each day.  Is this different from baseline? [ ] Yes [ X ]No Are your stools watery or do they have a foul smell? [ ] Yes [ X ]No Have you seen blood in your stools? [ ] Yes [ X ]No Are your stools dark, tarry, or sticky? [ ] Yes [X ]No Are you having pain or tenderness in your belly? [ ] Yes [ X ]No  Skin Does your skin itch? [ X ]Yes [] No -- per pt this is new, MD aware Do you have a rash? [ ] Yes [X ]No Has your skin blistered and/or peeled? [ ] Yes [X ]No Do you have sores in your mouth? [ ] Yes [X ]No  Hepatic Has your urine been dark or tea colored? [ ] Yes [X ]No Have you noticed that your skin or the whites of your eyes are turning yellow? [ ] Yes [ X]No Are you bleeding or bruising more easily than normal? [ ] Yes [X ]No Are you nauseous and/or vomiting? [ ] Yes [ X]No Do you have pain on the right side of your stomach? [ ] Yes [X ]No  Neurologic  Are you having unusual weakness of legs, arms, or face? [ ] Yes [X ]No Are you having numbness or tingling in your hands or feet? [ X ]Yes [ ] No -- pt states in R hand, feels it is getting a little worse, MD aware   Charleston Poot

## 2021-07-21 NOTE — Progress Notes (Signed)
Per Dr Julien Nordmann ok to proceed with treatment with pt reporting itchy skin recently- no rashes, blisters, or peeling and R hand staying cold with numbness/tingling that has gotten slightly worse

## 2021-07-21 NOTE — Patient Instructions (Signed)
[facility]  Discharge Instructions: Thank you for choosing [facility] to provide your oncology and hematology care.   If you have a lab appointment with the [facility], please go directly to the [facility] and check in at the registration area.   Wear comfortable clothing and clothing appropriate for easy access to any Portacath or PICC line.   We strive to give you quality time with your provider. You may need to reschedule your appointment if you arrive late (15 or more minutes).  Arriving late affects you and other patients whose appointments are after yours.  Also, if you miss three or more appointments without notifying the office, you may be dismissed from the clinic at the provider's discretion.      For prescription refill requests, have your pharmacy contact our office and allow 72 hours for refills to be completed.    Today you received the following chemotherapy and/or immunotherapy agents: Opdivo and Yervoy      To help prevent nausea and vomiting after your treatment, we encourage you to take your nausea medication as directed.  BELOW ARE SYMPTOMS THAT SHOULD BE REPORTED IMMEDIATELY: *FEVER GREATER THAN 100.4 F (38 C) OR HIGHER *CHILLS OR SWEATING *NAUSEA AND VOMITING THAT IS NOT CONTROLLED WITH YOUR NAUSEA MEDICATION *UNUSUAL SHORTNESS OF BREATH *UNUSUAL BRUISING OR BLEEDING *URINARY PROBLEMS (pain or burning when urinating, or frequent urination) *BOWEL PROBLEMS (unusual diarrhea, constipation, pain near the anus) TENDERNESS IN MOUTH AND THROAT WITH OR WITHOUT PRESENCE OF ULCERS (sore throat, sores in mouth, or a toothache) UNUSUAL RASH, SWELLING OR PAIN  UNUSUAL VAGINAL DISCHARGE OR ITCHING   Items with * indicate a potential emergency and should be followed up as soon as possible or go to the Emergency Department if any problems should occur.  Please show the CHEMOTHERAPY ALERT CARD or IMMUNOTHERAPY ALERT CARD at  check-in to the Emergency Department and triage nurse.  Should you have questions after your visit or need to cancel or reschedule your appointment, please contact [facility]  Dept: [phone]  and follow the prompts.  Office hours are 8:00 a.m. to 4:30 p.m. Monday - Friday. Please note that voicemails left after 4:00 p.m. may not be returned until the following business day.  We are closed weekends and major holidays. You have access to a nurse at all times for urgent questions. Please call the main number to the clinic Dept: [phone] and follow the prompts.   For any non-urgent questions, you may also contact your provider using MyChart. We now offer e-Visits for anyone 60 and older to request care online for non-urgent symptoms. For details visit [email or web address].   Also download the MyChart app! Go to the app store, search "MyChart", open the app, select [facility], and log in with your MyChart username and password.  Masks are optional in the cancer centers. If you would like for your care team to wear a mask while they are taking care of you, please let them know. For doctor visits, patients may have with them one support person who is at least 83 years old. At this time, visitors are not allowed in the infusion area.

## 2021-07-21 NOTE — Progress Notes (Signed)
[facility] [phone]   [phone]  OFFICE PROGRESS NOTE  Shaun Kennedy, [address] [place] [address]  DIAGNOSIS: stage IV (T3, N2, M1 a) non-small cell Kennedy cancer, squamous cell carcinoma presented with obstructive left lower lobe Kennedy mass in addition to mediastinal lymphadenopathy as well as bilateral pulmonary nodules diagnosed in July 2021.   PDL1: 0%   PRIOR THERAPY: None   CURRENT THERAPY: 1) Palliative radiotherapy to the obstructive left lower lobe Kennedy mass under the care of Dr. Lisbeth Renshaw.  2)  systemic chemotherapy 2 cycles of chemotherapy with carboplatin for an AUC of 5 and paclitaxel 175 mg/m2 in addition to immunotherapy with nivolumab 360 mg every 3 weeks and ipilimumab 1 mg/kg IV every 6 weeks followed by maintenance nivolumab and ipilimumab.  He started the first treatment on 09/04/2019.  He is status post 16 cycles.  INTERVAL HISTORY: Shaun Kennedy 81 y.o. male returns to the clinic today for follow-up visit.  The patient is feeling fine today with no concerning complaints.  He is very active and he mows grass at his house and walk at regular basis.  He denied having any current chest pain but has a baseline shortness of breath increased with exertion with no cough or hemoptysis.  He has no nausea, vomiting, diarrhea or constipation.  He has no headache or visual changes.  He has no recent weight loss or night sweats.  He continues to tolerate his treatment with ipilimumab and nivolumab fairly well.  The patient is here today for evaluation before starting day 1 of cycle #17.  MEDICAL HISTORY: Past Medical History:  Diagnosis Date   Allergic rhinitis    Asthma    Carotid stenosis    Colon cancer ([place]) 2003   Diabetes mellitus ([place])    Diverticulosis    Dyslipidemia    ED (erectile dysfunction)    GERD (gastroesophageal reflux disease)    H/O degenerative disc disease    Hemorrhoids    HTN (hypertension)     as a child   Hyperlipidemia    Kennedy cancer ([facility])    LVH (left ventricular hypertrophy)    on EKG   Mass of lower lobe of left Kennedy    Mediastinal adenopathy    Smoker    former   Wears dentures    Wears dentures    Wears glasses    Wears glasses     ALLERGIES:  has No Known Allergies.  MEDICATIONS:  Current Outpatient Medications  Medication Sig Dispense Refill   amLODipine (NORVASC) 5 MG tablet TAKE 1 TABLET EVERY DAY 90 tablet 3   aspirin EC 81 MG tablet Take 81 mg by mouth daily after breakfast.      bisacodyl (DULCOLAX) 5 MG EC tablet Take 10 mg by mouth daily as needed for moderate constipation.     Blood Glucose Calibration (TRUE METRIX LEVEL 1) Low SOLN      cholecalciferol (VITAMIN D) 25 MCG (1000 UNIT) tablet Take 1,000 Units by mouth daily.     dronabinol (MARINOL) 2.5 MG capsule Take 1 capsule (2.5 mg total) by mouth 2 (two) times daily before a meal. 60 capsule 1   ferrous sulfate 325 (65 FE) MG EC tablet TAKE 1 TABLET (325 MG TOTAL) DAILY WITH BREAKFAST. 90 tablet 1   gabapentin (NEURONTIN) 100 MG capsule Take by mouth.     Lancet Devices (TRUEDRAW LANCING DEVICE) MISC Check sugars daily 90 each 0  linaclotide (LINZESS) 72 MCG capsule Take by mouth.     lisinopril-hydrochlorothiazide (ZESTORETIC) 20-12.5 MG tablet TAKE 1 TABLET EVERY DAY 90 tablet 1   metFORMIN (GLUCOPHAGE-XR) 500 MG 24 hr tablet TAKE 1 TABLET EVERY DAY 90 tablet 1   Multiple Vitamin (MULTIVITAMIN WITH MINERALS) TABS tablet Take 1 tablet by mouth daily after breakfast.      simvastatin (ZOCOR) 40 MG tablet TAKE 1 TABLET EVERY DAY 90 tablet 1   solifenacin (VESICARE) 5 MG tablet TAKE 1 TABLET EVERY DAY 90 tablet 1   triamcinolone cream (KENALOG) 0.1 % Apply 1 application. topically 2 (two) times daily. 30 g 0   TRUE METRIX BLOOD GLUCOSE TEST test strip SMARTSIG:Via Meter     TRUEplus Lancets 33G MISC 1 each by Does not apply route 2 (two) times daily. 200 each 4   Alcohol Swabs (DROPSAFE ALCOHOL  PREP) 70 % PADS Apply topically.     hydrOXYzine (ATARAX) 10 MG tablet Take 1 tablet (10 mg total) by mouth 3 (three) times daily as needed. (Patient not taking: Reported on 07/21/2021) 30 tablet 1   No current facility-administered medications for this visit.   Facility-Administered Medications Ordered in Other Visits  Medication Dose Route Frequency Provider Last Rate Last Admin   sodium chloride flush (NS) 0.9 % injection 10 mL  10 mL Intracatheter PRN Curt Bears, MD   10 mL at 09/04/19 1556    SURGICAL HISTORY:  Past Surgical History:  Procedure Laterality Date   BRONCHIAL BIOPSY  08/20/2019   Procedure: BRONCHIAL BIOPSIES;  Surgeon: Collene Gobble, MD;  Location: [facility] ENDOSCOPY;  Service: Pulmonary;;   BRONCHIAL BRUSHINGS  08/20/2019   Procedure: BRONCHIAL BRUSHINGS;  Surgeon: Collene Gobble, MD;  Location[facility] ENDOSCOPY;  Service: Pulmonary;;   BRONCHIAL NEEDLE ASPIRATION BIOPSY  08/20/2019   Procedure: BRONCHIAL NEEDLE ASPIRATION BIOPSIES;  Surgeon: Collene Gobble, MD;  Location: [facility];  Service: Pulmonary;;   CARPAL TUNNEL RELEASE Right 01/09/2019   Procedure: CARPAL TUNNEL RELEASE;  Surgeon: Leandrew Koyanagi, MD;  Location: [facility];  Service: Orthopedics;  Laterality: Right;   CARPAL TUNNEL WITH CUBITAL TUNNEL Right 11/18/2020   Procedure: CARPAL TUNNEL WITH CUBITAL TUNNEL;  Surgeon: Charlotte Crumb, MD;  Location: [facility];  Service: Orthopedics;  Laterality: Right;   CATARACT EXTRACTION Right 2018   COLONOSCOPY  2007   Dr. Benson Norway   DIRECT LARYNGOSCOPY N/A 04/10/2020   Procedure: DIRECT LARYNGOSCOPY;  Surgeon: Melida Quitter, MD;  Location: [facility];  Service: ENT;  Laterality: N/A;   I & D EXTREMITY Right 04/02/2016   Procedure: IRRIGATION AND DEBRIDEMENT GREAT TOE;  Surgeon: Leandrew Koyanagi, MD;  Location: [facility];  Service: Orthopedics;  Laterality: Right;   IR IMAGING GUIDED PORT INSERTION  08/30/2019   MULTIPLE TOOTH EXTRACTIONS     RADIOACTIVE SEED IMPLANT  2003    TRACHEOSTOMY TUBE PLACEMENT N/A 04/10/2020   Procedure: TRACHEOSTOMY;  Surgeon: Melida Quitter, MD;  Location: [facility];  Service: ENT;  Laterality: N/A;   ULNAR TUNNEL RELEASE Right 01/09/2019   Procedure: RIGHT CUBITAL TUNNEL RELEASE AND CARPAL TUNNEL RELEASE;  Surgeon: Leandrew Koyanagi, MD;  Location: [facility];  Service: Orthopedics;  Laterality: Right;   UPPER GASTROINTESTINAL ENDOSCOPY     VIDEO BRONCHOSCOPY N/A 12/18/2019   Procedure: VIDEO BRONCHOSCOPY WITHOUT FLUORO;  Surgeon: Collene Gobble, MD;  Location: [facility] ENDOSCOPY;  Service: Cardiopulmonary;  Laterality: N/A;   VIDEO BRONCHOSCOPY WITH ENDOBRONCHIAL ULTRASOUND N/A 08/20/2019   Procedure:  VIDEO BRONCHOSCOPY WITH ENDOBRONCHIAL ULTRASOUND;  Surgeon: Collene Gobble, MD;  Location[facility] ENDOSCOPY;  Service: Pulmonary;  Laterality: N/A;    REVIEW OF SYSTEMS:  A comprehensive review of systems was negative except for: Respiratory: positive for dyspnea on exertion   PHYSICAL EXAMINATION: General appearance: alert, cooperative, and no distress Head: Normocephalic, without obvious abnormality, atraumatic Neck: no adenopathy, no JVD, supple, symmetrical, trachea midline, and thyroid not enlarged, symmetric, no tenderness/mass/nodules Lymph nodes: Cervical, supraclavicular, and axillary nodes normal. Resp: clear to auscultation bilaterally Back: symmetric, no curvature. ROM normal. No CVA tenderness. Cardio: regular rate and rhythm, S1, S2 normal, no murmur, click, rub or gallop GI: soft, non-tender; bowel sounds normal; no masses,  no organomegaly Extremities: extremities normal, atraumatic, no cyanosis or edema  ECOG PERFORMANCE STATUS: 1 - Symptomatic but completely ambulatory  Blood pressure (!) 138/57, pulse 66, temperature 98 F (36.7 C), temperature source Tympanic, resp. rate 17, height _0  (1.702 m), weight 182 lb (82.6 kg), SpO2 99 %.  LABORATORY DATA: Lab Results  Component Value Date   WBC 6.5 07/21/2021   HGB  10.5 (L) 07/21/2021   HCT 31.0 (L) 07/21/2021   MCV 93.9 07/21/2021   PLT 304 07/21/2021      Chemistry      Component Value Date/Time   NA 140 07/21/2021 0938   NA 132 (L) 08/14/2019 0918   K 4.0 07/21/2021 0938   CL 106 07/21/2021 0938   CO2 28 07/21/2021 0938   BUN 19 07/21/2021 0938   BUN 16 08/14/2019 0918   CREATININE 1.23 07/21/2021 0938   CREATININE 1.25 (H) 08/08/2016 0912      Component Value Date/Time   CALCIUM 9.3 07/21/2021 0938   ALKPHOS 61 07/21/2021 0938   AST 16 07/21/2021 0938   ALT 14 07/21/2021 0938   BILITOT 0.5 07/21/2021 0938       RADIOGRAPHIC STUDIES: No results found.  ASSESSMENT AND PLAN: This is a very pleasant 81 years old [name] with stage IV non-small cell Kennedy cancer, squamous cell carcinoma with negative PD-L1 expression. The patient is currently undergoing systemic chemotherapy with carboplatin and paclitaxel for 2 cycles in addition to immunotherapy with ipilimumab 1 mg/KG every 6 weeks and nivolumab 360 mg IV every 3 weeks status post 16 cycles. The patient continues to tolerate this treatment well with no concerning adverse effects. I recommended for him to proceed with day 1 of cycle #17 today as planned. The patient will come back for follow-up visit in 3 weeks for evaluation before the next cycle of his treatment. He was advised to call immediately if he has any other concerning symptoms in the interval. The patient voices understanding of current disease status and treatment options and is in agreement with the current care plan.  All questions were answered. The patient knows to call the clinic with any problems, questions or concerns. We can certainly see the patient much sooner if necessary.  Disclaimer: This note was dictated with voice recognition software. Similar sounding words can inadvertently be transcribed and may not be corrected upon review.

## 2021-07-27 ENCOUNTER — Telehealth: Payer: Self-pay | Admitting: Internal Medicine

## 2021-07-27 NOTE — Telephone Encounter (Signed)
Scheduled per 06/14 los, patient has been called and voicemail was left.

## 2021-07-30 ENCOUNTER — Ambulatory Visit (INDEPENDENT_AMBULATORY_CARE_PROVIDER_SITE_OTHER): Payer: Medicare HMO | Admitting: Family Medicine

## 2021-07-30 ENCOUNTER — Encounter: Payer: Self-pay | Admitting: Family Medicine

## 2021-07-30 VITALS — BP 138/70 | HR 69 | Temp 97.3°F | Wt 181.8 lb

## 2021-07-30 DIAGNOSIS — G3184 Mild cognitive impairment, so stated: Secondary | ICD-10-CM

## 2021-07-30 DIAGNOSIS — C3490 Malignant neoplasm of unspecified part of unspecified bronchus or lung: Secondary | ICD-10-CM

## 2021-07-30 DIAGNOSIS — Z113 Encounter for screening for infections with a predominantly sexual mode of transmission: Secondary | ICD-10-CM | POA: Diagnosis not present

## 2021-07-31 LAB — RPR: RPR Ser Ql: NONREACTIVE

## 2021-07-31 LAB — VITAMIN B12: Vitamin B-12: 354 pg/mL (ref 232–1245)

## 2021-07-31 LAB — FOLATE: Folate: >20 ng/mL (ref >3.0)

## 2021-07-31 MED ORDER — DONEPEZIL HCL 5 MG PO TABS: 5.0000 mg | ORAL_TABLET | Freq: Every day | ORAL | 0 refills | Status: DC

## 2021-08-02 DIAGNOSIS — H18893 Other specified disorders of cornea, bilateral: Secondary | ICD-10-CM | POA: Diagnosis not present

## 2021-08-02 DIAGNOSIS — H25812 Combined forms of age-related cataract, left eye: Secondary | ICD-10-CM | POA: Diagnosis not present

## 2021-08-03 ENCOUNTER — Ambulatory Visit (HOSPITAL_COMMUNITY)
Admission: RE | Admit: 2021-08-03 | Discharge: 2021-08-03 | Disposition: A | Discharge status: Home / Self Care | Payer: Medicare HMO | Source: Ambulatory Visit | Attending: Internal Medicine | Admitting: Internal Medicine

## 2021-08-03 DIAGNOSIS — Z430 Encounter for attention to tracheostomy: Secondary | ICD-10-CM | POA: Insufficient documentation

## 2021-08-03 NOTE — Progress Notes (Addendum)
Tracheostomy Procedure Note  Shaun Kennedy [id number] Dec 31, 1940  Pre Procedure Tracheostomy Information  Trach Brand: Shiley Size:  6.0  [id number] Style: Uncuffed Secured by: Velcro   Procedure: Trach change and trach cleaning    Post Procedure Tracheostomy Information  Trach Brand: Shiley Size:  6.0  [id number] Style: Uncuffed Secured by: Velcro   Post Procedure Evaluation:  ETCO2 positive color change from yellow to purple : Yes.   Vital signs stable  Patients current condition: stable Complications: No apparent complications Trach site exam: clean, dry Wound care done: 4 x 4 gauze drain Patient did tolerate procedure well.   Education: none  Prescription needs: none    Additional needs: New PMV given to patient at this visit

## 2021-08-03 NOTE — Progress Notes (Signed)
Reason for visit: Tracheostomy change  HPI 81 year old male who I last saw on 3/28.  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  Presents today for planned tracheostomy change Interval review of records: Seen by his PCP on 6/23 for memory loss Continues to be followed by oncology Undergoing active chemotherapy he has currently completed cycle #17 of chemotherapy with carboplatin and paclitaxel, he is also on immunotherapy with ipilimumab and nivolumab, last infusion documented on 6/14  Review of Systems  All other systems reviewed and are negative.    Physical exam General: well developed 81 year old male presents today for planned trach change  HEENT: NCAT trach site is unremarkable  Pulmonary: clear decreased bases no accessory use  Cardiac: RRR Abdomen: soft not tender  Extremities: warm and dry  Neuro: awake and oriented.   Tracheostomy procedure:  Tracheostomy change Current trach was removed. The site was inspected and found to be unremarkable. New trach placed over obturator. 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

## 2021-08-05 NOTE — Progress Notes (Signed)
[facility] OFFICE PROGRESS NOTE  Shaun Kennedy, [place] [address]  DIAGNOSIS: Stage IV (T3, N2, M1 a) non-small cell Kennedy cancer, squamous cell carcinoma presented with obstructive left lower lobe Kennedy mass in addition to mediastinal lymphadenopathy as well as bilateral pulmonary nodules diagnosed in July 2021.   PDL1: 0%  PRIOR THERAPY:  Palliative radiotherapy to the obstructive left lower lobe Kennedy mass under the care of Dr. Lisbeth Kennedy. Completed on 09/20/19  CURRENT THERAPY:  Systemic chemotherapy 2 cycles of chemotherapy with carboplatin for an AUC of 5 and paclitaxel 175 mg/m2 in addition to immunotherapy with nivolumab 360 mg every 3 weeks and ipilimumab 1 mg/kg IV every 6 weeks followed by maintenance nivolumab and ipilimumab.  He started the first treatment on 09/04/2019.  He is status post 17 cycles of treatment.     INTERVAL HISTORY: Shaun Kennedy 81 y.o. male returns to the clinic today for a follow up visit. The patient is feeling well today without any concerning complaints. He follows closely with ENT for his tracheostomy.  The patient continues to tolerate treatment with immunotherapy well without any adverse side effects. Denies any fever, chills, night sweats, or weight loss. Denies any chest pain or hemoptysis. He sometimes has a cough related to mucus production getting caught in his tracheostomy. He has been taking mucinex. He denies any shortness of breath unless with certain strenuous activities he may get winded. He is fairly active with yard work when it is not too hot outside. Denies any nausea, vomiting, constipation, or diarrhea. Denies any headache or visual changes. Denies any rashes or skin changes. The patient is here today for evaluation prior to starting cycle #17 day 22.   MEDICAL HISTORY: Past Medical History:  Diagnosis Date   Allergic rhinitis    Asthma    Carotid stenosis    Colon cancer ([facility]) 2003   Diabetes  mellitus ([facility])    Diverticulosis    Dyslipidemia    ED (erectile dysfunction)    GERD (gastroesophageal reflux disease)    H/O degenerative disc disease    Hemorrhoids    HTN (hypertension)    as a child   Hyperlipidemia    Kennedy cancer ([facility])    LVH (left ventricular hypertrophy)    on EKG   Mass of lower lobe of left Kennedy    Mediastinal adenopathy    Smoker    former   Wears dentures    Wears dentures    Wears glasses    Wears glasses     ALLERGIES:  has No Known Allergies.  MEDICATIONS:  Current Outpatient Medications  Medication Sig Dispense Refill   Alcohol Swabs (DROPSAFE ALCOHOL PREP) 70 % PADS Apply topically.     amLODipine (NORVASC) 5 MG tablet TAKE 1 TABLET EVERY DAY 90 tablet 3   aspirin EC 81 MG tablet Take 81 mg by mouth daily after breakfast.      bisacodyl (DULCOLAX) 5 MG EC tablet Take 10 mg by mouth daily as needed for moderate constipation.     Blood Glucose Calibration (TRUE METRIX LEVEL 1) Low SOLN      cholecalciferol (VITAMIN D) 25 MCG (1000 UNIT) tablet Take 1,000 Units by mouth daily.     donepezil (ARICEPT) 5 MG tablet Take 1 tablet (5 mg total) by mouth at bedtime. 90 tablet 0   dronabinol (MARINOL) 2.5 MG capsule Take 1 capsule (2.5 mg total) by mouth 2 (two) times daily before a  meal. 60 capsule 1   ferrous sulfate 325 (65 FE) MG EC tablet TAKE 1 TABLET (325 MG TOTAL) DAILY WITH BREAKFAST. 90 tablet 1   gabapentin (NEURONTIN) 100 MG capsule Take by mouth.     hydrOXYzine (ATARAX) 10 MG tablet Take 1 tablet (10 mg total) by mouth 3 (three) times daily as needed. 30 tablet 1   Lancet Devices (TRUEDRAW LANCING DEVICE) MISC Check sugars daily 90 each 0   linaclotide (LINZESS) 72 MCG capsule Take by mouth.     lisinopril-hydrochlorothiazide (ZESTORETIC) 20-12.5 MG tablet TAKE 1 TABLET EVERY DAY 90 tablet 1   metFORMIN (GLUCOPHAGE-XR) 500 MG 24 hr tablet TAKE 1 TABLET EVERY DAY 90 tablet 1   Multiple Vitamin (MULTIVITAMIN WITH MINERALS) TABS tablet Take  1 tablet by mouth daily after breakfast.      simvastatin (ZOCOR) 40 MG tablet TAKE 1 TABLET EVERY DAY 90 tablet 1   solifenacin (VESICARE) 5 MG tablet TAKE 1 TABLET EVERY DAY 90 tablet 1   triamcinolone cream (KENALOG) 0.1 % Apply 1 application. topically 2 (two) times daily. 30 g 0   TRUE METRIX BLOOD GLUCOSE TEST test strip SMARTSIG:Via Meter     TRUEplus Lancets 33G MISC 1 each by Does not apply route 2 (two) times daily. 200 each 4   No current facility-administered medications for this visit.   Facility-Administered Medications Ordered in Other Visits  Medication Dose Route Frequency Provider Last Rate Last Admin   sodium chloride flush (NS) 0.9 % injection 10 mL  10 mL Intracatheter PRN Shaun Bears, MD   10 mL at 09/04/19 1556    SURGICAL HISTORY:  Past Surgical History:  Procedure Laterality Date   BRONCHIAL BIOPSY  08/20/2019   Procedure: BRONCHIAL BIOPSIES;  Surgeon: Shaun Gobble, MD;  [facility] ENDOSCOPY;  Service: Pulmonary;;   BRONCHIAL BRUSHINGS  08/20/2019   Procedure: BRONCHIAL BRUSHINGS;  Surgeon: Shaun Gobble, MD;  Location: [facility] ENDOSCOPY;  Service: Pulmonary;;   BRONCHIAL NEEDLE ASPIRATION BIOPSY  08/20/2019   Procedure: BRONCHIAL NEEDLE ASPIRATION BIOPSIES;  Surgeon: Shaun Gobble, MD;  Location: [facility];  Service: Pulmonary;;   CARPAL TUNNEL RELEASE Right 01/09/2019   Procedure: CARPAL TUNNEL RELEASE;  Surgeon: Shaun Koyanagi, MD;  Location: [facility];  Service: Orthopedics;  Laterality: Right;   CARPAL TUNNEL WITH CUBITAL TUNNEL Right 11/18/2020   Procedure: CARPAL TUNNEL WITH CUBITAL TUNNEL;  Surgeon: Shaun Crumb, MD;  Location: [facility];  Service: Orthopedics;  Laterality: Right;   CATARACT EXTRACTION Right 2018   COLONOSCOPY  2007   Dr. Benson Kennedy   DIRECT LARYNGOSCOPY N/A 04/10/2020   Procedure: DIRECT LARYNGOSCOPY;  Surgeon: Shaun Quitter, MD;  Location: [facility];  Service: ENT;  Laterality: N/A;   I & D EXTREMITY Right 04/02/2016    Procedure: IRRIGATION AND DEBRIDEMENT GREAT TOE;  Surgeon: Shaun Koyanagi, MD;  Location: [facility];  Service: Orthopedics;  Laterality: Right;   IR IMAGING GUIDED PORT INSERTION  08/30/2019   MULTIPLE TOOTH EXTRACTIONS     RADIOACTIVE SEED IMPLANT  2003   TRACHEOSTOMY TUBE PLACEMENT N/A 04/10/2020   Procedure: TRACHEOSTOMY;  Surgeon: Shaun Quitter, MD;  Location: [facility];  Service: ENT;  Laterality: N/A;   ULNAR TUNNEL RELEASE Right 01/09/2019   Procedure: RIGHT CUBITAL TUNNEL RELEASE AND CARPAL TUNNEL RELEASE;  Surgeon: Shaun Koyanagi, MD;  Location: [facility];  Service: Orthopedics;  Laterality: Right;   UPPER GASTROINTESTINAL ENDOSCOPY     VIDEO BRONCHOSCOPY N/A 12/18/2019  Procedure: VIDEO BRONCHOSCOPY WITHOUT FLUORO;  Surgeon: Shaun Gobble, MD;  Location: [facility] ENDOS[facility];  Service: Cardiopulmonary;  Laterality: N/A;   VIDEO BRONCHOSCOPY WITH ENDOBRONCHIAL ULTRASOUND N/A 08/20/2019   Procedure: VIDEO BRONCHOSCOPY WITH ENDOBRONCHIAL ULTRASOUND;  Surgeon: Shaun Gobble, MD;  Location: [facility] ENDOSCOPY;  Service: Pulmonary;  Laterality: N/A;    REVIEW OF SYSTEMS:   Review of Systems  Constitutional: Negative for appetite change, chills, fatigue, fever and unexpected weight change.  HENT: Negative for mouth sores, nosebleeds, sore throat and trouble swallowing.   Eyes: Negative for eye problems and icterus.  Respiratory: Positive for occasional cough depending on mucus blocking his trach. Positive for mild dyspnea with certain activities. Negative for hemoptysis and wheezing.   Cardiovascular: Negative for chest pain and leg swelling.  Gastrointestinal: Negative for abdominal pain, constipation, diarrhea, nausea and vomiting.  Genitourinary: Negative for bladder incontinence, difficulty urinating, dysuria, frequency and hematuria.   Musculoskeletal: Negative for back pain, gait problem, neck pain and neck stiffness.  Skin: Negative for itching and rash.  Neurological: Negative for  dizziness, extremity weakness, gait problem, headaches, light-headedness and seizures.  Hematological: Negative for adenopathy. Does not bruise/bleed easily.  Psychiatric/Behavioral: Negative for confusion, depression and sleep disturbance. The patient is not nervous/anxious.   PHYSICAL EXAMINATION:  Blood pressure 131/65, pulse 67, temperature 98.2 F (36.8 C), temperature source Oral, resp. rate 17, height '5\' 7"'  (1.702 m), weight 182 lb 14.4 oz (83 kg), SpO2 99 %.  ECOG PERFORMANCE STATUS: 1  Physical Exam  Constitutional: Oriented to person, place, and time and well-developed, well-nourished, and in no distress.  Head: Normocephalic and atraumatic. Mouth/Throat: Oropharynx is clear and moist. No oropharyngeal exudate. Tracheostomy in place.  Eyes: Conjunctivae are normal. Right eye exhibits no discharge. Left eye exhibits no discharge. No scleral icterus. Neck: Normal range of motion. Neck supple. Cardiovascular: Normal rate, regular rhythm, normal heart sounds and intact distal pulses.   Pulmonary/Chest: Effort normal. Rhonchi present bilaterally which cleared with coughing. No respiratory distress. No rales. Abdominal: Soft. Bowel sounds are normal. Exhibits no distension and no mass. There is no tenderness.  Musculoskeletal: Normal range of motion. Exhibits no edema.  Lymphadenopathy:    No cervical adenopathy.  Neurological: Alert and oriented to person, place, and time. Exhibits normal muscle tone. Gait normal. Coordination normal. Skin: Skin is warm and dry. No rash noted. Not diaphoretic. No erythema. No pallor.  Psychiatric: Mood, memory and judgment normal. Vitals reviewed  LABORATORY DATA: Lab Results  Component Value Date   WBC 6.4 08/11/2021   HGB 10.4 (L) 08/11/2021   HCT 31.0 (L) 08/11/2021   MCV 93.7 08/11/2021   PLT 310 08/11/2021      Chemistry      Component Value Date/Time   NA 140 07/21/2021 0938   NA 132 (L) 08/14/2019 0918   K 4.0 07/21/2021 0938    CL 106 07/21/2021 0938   CO2 28 07/21/2021 0938   BUN 19 07/21/2021 0938   BUN 16 08/14/2019 0918   CREATININE 1.23 07/21/2021 0938   CREATININE 1.25 (H) 08/08/2016 0912      Component Value Date/Time   CALCIUM 9.3 07/21/2021 0938   ALKPHOS 61 07/21/2021 0938   AST 16 07/21/2021 0938   ALT 14 07/21/2021 0938   BILITOT 0.5 07/21/2021 0938       RADIOGRAPHIC STUDIES:  No results found.   ASSESSMENT/PLAN:  This is a very pleasant 81 year old [name] diagnosed with stage IV (T3, N2, M1a) non-small cell Kennedy cancer, squamous  cell carcinoma.  He presented with a left lower lobe obstructive Kennedy mass in addition to mediastinal lymphadenopathy.  He also presented with bilateral pulmonary nodules.  He was diagnosed in July 2021.  His PD-L1 expression is negative.   He completed palliative radiotherapy to the obstructive Kennedy mass under the care of Dr. Lisbeth Kennedy in August 2021.   The patient is currently undergoing systemic chemotherapy with carboplatin and paclitaxel for 2 cycles in addition to immunotherapy with ipilimumab 1 mg/KG every 6 weeks and nivolumab 360 mg IV every 3 weeks.  He is status post 17 cycles.    Labs were reviewed.  Recommend that he proceed with cycle 17 day 22 today scheduled.   We will see him back for follow-up visit in 3 weeks for evaluation before starting day 1 cycle 18 and to review his scan results.   He will continue to follow with ENT for his tracheostomy.    I will arrange for a restaging Ct scan of the chest, abdomen, and pelvis prior to starting his next cycle of treatment.    The patient was advised to call immediately if he has any concerning symptoms in the interval. The patient voices understanding of current disease status and treatment options and is in agreement with the current care plan. All questions were answered. The patient knows to call the clinic with any problems, questions or concerns. We can certainly see the patient much  sooner if necessary    Orders Placed This Encounter  Procedures   CT Chest W Contrast    Standing Status:   Future    Standing Expiration Date:   08/11/2022    Order Specific Question:   If indicated for the ordered procedure, I authorize the administration of contrast media per Radiology protocol    Answer:   Yes    Order Specific Question:   Preferred imaging location?    Answer:   [facility]   CT Abdomen Pelvis W Contrast    Standing Status:   Future    Standing Expiration Date:   08/11/2022    Order Specific Question:   If indicated for the ordered procedure, I authorize the administration of contrast media per Radiology protocol    Answer:   Yes    Order Specific Question:   Preferred imaging location?    Answer:   [facility]    Order Specific Question:   Is Oral Contrast requested for this exam?    Answer:   Yes, Per Radiology protocol     The total time spent in the appointment was 20-29 minutes.Tobe [place] Mitton, PA-C 08/11/21

## 2021-08-11 ENCOUNTER — Inpatient Hospital Stay: Payer: Medicare HMO

## 2021-08-11 ENCOUNTER — Inpatient Hospital Stay (HOSPITAL_BASED_OUTPATIENT_CLINIC_OR_DEPARTMENT_OTHER): Payer: Medicare HMO | Admitting: Physician Assistant

## 2021-08-11 ENCOUNTER — Inpatient Hospital Stay: Payer: Medicare HMO | Attending: Internal Medicine

## 2021-08-11 ENCOUNTER — Other Ambulatory Visit: Payer: Self-pay

## 2021-08-11 VITALS — BP 131/65 | HR 67 | Temp 98.2°F | Resp 17 | Ht 67.0 in | Wt 182.9 lb

## 2021-08-11 DIAGNOSIS — Z7982 Long term (current) use of aspirin: Secondary | ICD-10-CM | POA: Diagnosis not present

## 2021-08-11 DIAGNOSIS — Z95828 Presence of other vascular implants and grafts: Secondary | ICD-10-CM

## 2021-08-11 DIAGNOSIS — Z79899 Other long term (current) drug therapy: Secondary | ICD-10-CM | POA: Insufficient documentation

## 2021-08-11 DIAGNOSIS — Z7984 Long term (current) use of oral hypoglycemic drugs: Secondary | ICD-10-CM | POA: Insufficient documentation

## 2021-08-11 DIAGNOSIS — Z87891 Personal history of nicotine dependence: Secondary | ICD-10-CM | POA: Insufficient documentation

## 2021-08-11 DIAGNOSIS — Z5112 Encounter for antineoplastic immunotherapy: Secondary | ICD-10-CM | POA: Insufficient documentation

## 2021-08-11 DIAGNOSIS — C3432 Malignant neoplasm of lower lobe, left bronchus or lung: Secondary | ICD-10-CM

## 2021-08-11 DIAGNOSIS — Z923 Personal history of irradiation: Secondary | ICD-10-CM | POA: Insufficient documentation

## 2021-08-11 LAB — CMP (CANCER CENTER ONLY)
ALT: 13 U/L (ref 0–44)
AST: 15 U/L (ref 15–41)
Albumin: 3.6 g/dL (ref 3.5–5.0)
Alkaline Phosphatase: 59 U/L (ref 38–126)
Anion gap: 6 (ref 5–15)
BUN: 25 mg/dL — ABNORMAL HIGH (ref 8–23)
CO2: 26 mmol/L (ref 22–32)
Calcium: 9 mg/dL (ref 8.9–10.3)
Chloride: 108 mmol/L (ref 98–111)
Creatinine: 1.38 mg/dL — ABNORMAL HIGH (ref 0.61–1.24)
GFR, Estimated: 51 mL/min — ABNORMAL LOW (ref >60)
Glucose, Bld: 105 mg/dL — ABNORMAL HIGH (ref 70–99)
Potassium: 3.8 mmol/L (ref 3.5–5.1)
Sodium: 140 mmol/L (ref 135–145)
Total Bilirubin: 0.5 mg/dL (ref 0.3–1.2)
Total Protein: 8 g/dL (ref 6.5–8.1)

## 2021-08-11 LAB — TSH: TSH: 1.914 u[IU]/mL (ref 0.350–4.500)

## 2021-08-11 LAB — CBC WITH DIFFERENTIAL (CANCER CENTER ONLY)
Abs Immature Granulocytes: 0.02 10*3/uL (ref 0.00–0.07)
Basophils Absolute: 0 10*3/uL (ref 0.0–0.1)
Basophils Relative: 0 %
Eosinophils Absolute: 1 10*3/uL — ABNORMAL HIGH (ref 0.0–0.5)
Eosinophils Relative: 16 %
HCT: 31 % — ABNORMAL LOW (ref 39.0–52.0)
Hemoglobin: 10.4 g/dL — ABNORMAL LOW (ref 13.0–17.0)
Immature Granulocytes: 0 %
Lymphocytes Relative: 21 %
Lymphs Abs: 1.4 10*3/uL (ref 0.7–4.0)
MCH: 31.4 pg (ref 26.0–34.0)
MCHC: 33.5 g/dL (ref 30.0–36.0)
MCV: 93.7 fL (ref 80.0–100.0)
Monocytes Absolute: 0.6 10*3/uL (ref 0.1–1.0)
Monocytes Relative: 9 %
Neutro Abs: 3.5 10*3/uL (ref 1.7–7.7)
Neutrophils Relative %: 54 %
Platelet Count: 310 10*3/uL (ref 150–400)
RBC: 3.31 MIL/uL — ABNORMAL LOW (ref 4.22–5.81)
RDW: 12.2 % (ref 11.5–15.5)
WBC Count: 6.4 10*3/uL (ref 4.0–10.5)
nRBC: 0 % (ref 0.0–0.2)

## 2021-08-11 MED ORDER — SODIUM CHLORIDE 0.9% FLUSH
10.0000 mL | INTRAVENOUS | Status: DC | PRN
  Administered 2021-08-11: 10 mL

## 2021-08-11 MED ORDER — HEPARIN SOD (PORK) LOCK FLUSH 100 UNIT/ML IV SOLN
500.0000 [IU] | Freq: Once | INTRAVENOUS | Status: AC | PRN
  Administered 2021-08-11: 500 [IU]

## 2021-08-11 MED ORDER — SODIUM CHLORIDE 0.9 % IV SOLN
360.0000 mg | Freq: Once | INTRAVENOUS | Status: AC
  Administered 2021-08-11: 360 mg via INTRAVENOUS
  Filled 2021-08-11: qty 24

## 2021-08-11 MED ORDER — SODIUM CHLORIDE 0.9% FLUSH
10.0000 mL | Freq: Once | INTRAVENOUS | Status: AC
  Administered 2021-08-11: 10 mL

## 2021-08-11 MED ORDER — SODIUM CHLORIDE 0.9 % IV SOLN: Freq: Once | INTRAVENOUS | Status: AC

## 2021-08-11 NOTE — Patient Instructions (Signed)
[facility] ONCOLOG[facility]  Discharge Instructions: Thank you for choosing [facility] to provide your oncology and hematology care.   If you have a lab appointment with the [facility], please go directly to the [facility] and check in at the registration area.   Wear comfortable clothing and clothing appropriate for easy access to any Portacath or PICC line.   We strive to give you quality time with your provider. You may need to reschedule your appointment if you arrive late (15 or more minutes).  Arriving late affects you and other patients whose appointments are after yours.  Also, if you miss three or more appointments without notifying the office, you may be dismissed from the clinic at the provider's discretion.      For prescription refill requests, have your pharmacy contact our office and allow 72 hours for refills to be completed.    Today you received the following chemotherapy and/or immunotherapy agents: Opdivo.       To help prevent nausea and vomiting after your treatment, we encourage you to take your nausea medication as directed.  BELOW ARE SYMPTOMS THAT SHOULD BE REPORTED IMMEDIATELY: *FEVER GREATER THAN 100.4 F (38 C) OR HIGHER *CHILLS OR SWEATING *NAUSEA AND VOMITING THAT IS NOT CONTROLLED WITH YOUR NAUSEA MEDICATION *UNUSUAL SHORTNESS OF BREATH *UNUSUAL BRUISING OR BLEEDING *URINARY PROBLEMS (pain or burning when urinating, or frequent urination) *BOWEL PROBLEMS (unusual diarrhea, constipation, pain near the anus) TENDERNESS IN MOUTH AND THROAT WITH OR WITHOUT PRESENCE OF ULCERS (sore throat, sores in mouth, or a toothache) UNUSUAL RASH, SWELLING OR PAIN  UNUSUAL VAGINAL DISCHARGE OR ITCHING   Items with * indicate a potential emergency and should be followed up as soon as possible or go to the Emergency Department if any problems should occur.  Please show the CHEMOTHERAPY ALERT CARD or IMMUNOTHERAPY ALERT CARD at check-in to  the Emergency Department and triage nurse.  Should you have questions after your visit or need to cancel or reschedule your appointment, please contact [facility]  Dept: [phone]  and follow the prompts.  Office hours are 8:00 a.m. to 4:30 p.m. Monday - Friday. Please note that voicemails left after 4:00 p.m. may not be returned until the following business day.  We are closed weekends and major holidays. You have access to a nurse at all times for urgent questions. Please call the main number to the clinic Dept: [phone] and follow the prompts.   For any non-urgent questions, you may also contact your provider using MyChart. We now offer e-Visits for anyone 77 and older to request care online for non-urgent symptoms. For details visit [email or web address].   Also download the MyChart app! Go to the app store, search "MyChart", open the app, select [facility], and log in with your MyChart username and password.  Masks are optional in the cancer centers. If you would like for your care team to wear a mask while they are taking care of you, please let them know. For doctor visits, patients may have with them one support person who is at least 83 years old. At this time, visitors are not allowed in the infusion area.

## 2021-08-19 ENCOUNTER — Telehealth: Payer: Self-pay | Admitting: Medical Oncology

## 2021-08-19 NOTE — Telephone Encounter (Signed)
Explained purpose of Barium for scans . Message sent to radiology scheduler to call wife with ct appts.

## 2021-08-21 ENCOUNTER — Ambulatory Visit (HOSPITAL_COMMUNITY): Payer: Medicare HMO

## 2021-08-24 ENCOUNTER — Other Ambulatory Visit: Payer: Self-pay | Admitting: Medical

## 2021-08-25 ENCOUNTER — Encounter: Payer: Self-pay | Admitting: Gastroenterology

## 2021-08-30 ENCOUNTER — Other Ambulatory Visit: Payer: Self-pay

## 2021-08-31 ENCOUNTER — Ambulatory Visit (HOSPITAL_COMMUNITY)
Admission: RE | Admit: 2021-08-31 | Discharge: 2021-08-31 | Disposition: A | Discharge status: Home / Self Care | Payer: Medicare HMO | Source: Ambulatory Visit | Attending: Physician Assistant | Admitting: Physician Assistant

## 2021-08-31 ENCOUNTER — Other Ambulatory Visit: Payer: Self-pay

## 2021-08-31 DIAGNOSIS — K5730 Diverticulosis of large intestine without perforation or abscess without bleeding: Secondary | ICD-10-CM | POA: Diagnosis not present

## 2021-08-31 DIAGNOSIS — R911 Solitary pulmonary nodule: Secondary | ICD-10-CM | POA: Diagnosis not present

## 2021-08-31 DIAGNOSIS — N281 Cyst of kidney, acquired: Secondary | ICD-10-CM | POA: Diagnosis not present

## 2021-08-31 DIAGNOSIS — C3432 Malignant neoplasm of lower lobe, left bronchus or lung: Secondary | ICD-10-CM | POA: Insufficient documentation

## 2021-08-31 DIAGNOSIS — C3490 Malignant neoplasm of unspecified part of unspecified bronchus or lung: Secondary | ICD-10-CM | POA: Diagnosis not present

## 2021-08-31 MED ORDER — HEPARIN SOD (PORK) LOCK FLUSH 100 UNIT/ML IV SOLN
INTRAVENOUS | Status: AC
  Filled 2021-08-31: qty 5

## 2021-08-31 MED ORDER — SODIUM CHLORIDE (PF) 0.9 % IJ SOLN
INTRAMUSCULAR | Status: AC
  Filled 2021-08-31: qty 50

## 2021-08-31 MED ORDER — HEPARIN SOD (PORK) LOCK FLUSH 100 UNIT/ML IV SOLN
500.0000 [IU] | Freq: Once | INTRAVENOUS | Status: AC
  Administered 2021-08-31: 500 [IU] via INTRAVENOUS

## 2021-08-31 MED ORDER — IOHEXOL 300 MG/ML  SOLN
100.0000 mL | Freq: Once | INTRAMUSCULAR | Status: AC | PRN
  Administered 2021-08-31: 100 mL via INTRAVENOUS

## 2021-09-01 ENCOUNTER — Other Ambulatory Visit: Payer: Self-pay | Admitting: Family Medicine

## 2021-09-01 ENCOUNTER — Inpatient Hospital Stay (HOSPITAL_BASED_OUTPATIENT_CLINIC_OR_DEPARTMENT_OTHER): Payer: Medicare HMO | Admitting: Internal Medicine

## 2021-09-01 ENCOUNTER — Inpatient Hospital Stay: Payer: Medicare HMO

## 2021-09-01 ENCOUNTER — Other Ambulatory Visit: Payer: Self-pay

## 2021-09-01 ENCOUNTER — Encounter: Payer: Self-pay | Admitting: Internal Medicine

## 2021-09-01 VITALS — BP 131/63 | HR 58 | Temp 97.1°F | Resp 18 | Wt 183.2 lb

## 2021-09-01 DIAGNOSIS — C3432 Malignant neoplasm of lower lobe, left bronchus or lung: Secondary | ICD-10-CM

## 2021-09-01 DIAGNOSIS — Z87891 Personal history of nicotine dependence: Secondary | ICD-10-CM | POA: Diagnosis not present

## 2021-09-01 DIAGNOSIS — Z5112 Encounter for antineoplastic immunotherapy: Secondary | ICD-10-CM

## 2021-09-01 DIAGNOSIS — Z95828 Presence of other vascular implants and grafts: Secondary | ICD-10-CM

## 2021-09-01 DIAGNOSIS — Z923 Personal history of irradiation: Secondary | ICD-10-CM | POA: Diagnosis not present

## 2021-09-01 DIAGNOSIS — Z79899 Other long term (current) drug therapy: Secondary | ICD-10-CM | POA: Diagnosis not present

## 2021-09-01 DIAGNOSIS — E119 Type 2 diabetes mellitus without complications: Secondary | ICD-10-CM

## 2021-09-01 DIAGNOSIS — Z7984 Long term (current) use of oral hypoglycemic drugs: Secondary | ICD-10-CM | POA: Diagnosis not present

## 2021-09-01 DIAGNOSIS — Z7982 Long term (current) use of aspirin: Secondary | ICD-10-CM | POA: Diagnosis not present

## 2021-09-01 LAB — CBC WITH DIFFERENTIAL (CANCER CENTER ONLY)
Abs Immature Granulocytes: 0.01 10*3/uL (ref 0.00–0.07)
Basophils Absolute: 0 10*3/uL (ref 0.0–0.1)
Basophils Relative: 0 %
Eosinophils Absolute: 1 10*3/uL — ABNORMAL HIGH (ref 0.0–0.5)
Eosinophils Relative: 16 %
HCT: 30.4 % — ABNORMAL LOW (ref 39.0–52.0)
Hemoglobin: 10.3 g/dL — ABNORMAL LOW (ref 13.0–17.0)
Immature Granulocytes: 0 %
Lymphocytes Relative: 17 %
Lymphs Abs: 1 10*3/uL (ref 0.7–4.0)
MCH: 31.5 pg (ref 26.0–34.0)
MCHC: 33.9 g/dL (ref 30.0–36.0)
MCV: 93 fL (ref 80.0–100.0)
Monocytes Absolute: 0.6 10*3/uL (ref 0.1–1.0)
Monocytes Relative: 10 %
Neutro Abs: 3.4 10*3/uL (ref 1.7–7.7)
Neutrophils Relative %: 57 %
Platelet Count: 288 10*3/uL (ref 150–400)
RBC: 3.27 MIL/uL — ABNORMAL LOW (ref 4.22–5.81)
RDW: 12.7 % (ref 11.5–15.5)
WBC Count: 6 10*3/uL (ref 4.0–10.5)
nRBC: 0 % (ref 0.0–0.2)

## 2021-09-01 LAB — CMP (CANCER CENTER ONLY)
ALT: 12 U/L (ref 0–44)
AST: 19 U/L (ref 15–41)
Albumin: 3.6 g/dL (ref 3.5–5.0)
Alkaline Phosphatase: 58 U/L (ref 38–126)
Anion gap: 4 — ABNORMAL LOW (ref 5–15)
BUN: 16 mg/dL (ref 8–23)
CO2: 29 mmol/L (ref 22–32)
Calcium: 8.9 mg/dL (ref 8.9–10.3)
Chloride: 105 mmol/L (ref 98–111)
Creatinine: 1.24 mg/dL (ref 0.61–1.24)
GFR, Estimated: 58 mL/min — ABNORMAL LOW (ref >60)
Glucose, Bld: 107 mg/dL — ABNORMAL HIGH (ref 70–99)
Potassium: 3.9 mmol/L (ref 3.5–5.1)
Sodium: 138 mmol/L (ref 135–145)
Total Bilirubin: 0.9 mg/dL (ref 0.3–1.2)
Total Protein: 8.1 g/dL (ref 6.5–8.1)

## 2021-09-01 LAB — TSH: TSH: 2.219 u[IU]/mL (ref 0.350–4.500)

## 2021-09-01 MED ORDER — SODIUM CHLORIDE 0.9 % IV SOLN
360.0000 mg | Freq: Once | INTRAVENOUS | Status: AC
  Administered 2021-09-01: 360 mg via INTRAVENOUS
  Filled 2021-09-01: qty 24

## 2021-09-01 MED ORDER — FAMOTIDINE IN NACL 20-0.9 MG/50ML-% IV SOLN
20.0000 mg | Freq: Once | INTRAVENOUS | Status: AC
  Administered 2021-09-01: 20 mg via INTRAVENOUS
  Filled 2021-09-01: qty 50

## 2021-09-01 MED ORDER — SODIUM CHLORIDE 0.9% FLUSH
10.0000 mL | Freq: Once | INTRAVENOUS | Status: AC
  Administered 2021-09-01: 10 mL

## 2021-09-01 MED ORDER — SODIUM CHLORIDE 0.9 % IV SOLN
1.0000 mg/kg | Freq: Once | INTRAVENOUS | Status: AC
  Administered 2021-09-01: 80 mg via INTRAVENOUS
  Filled 2021-09-01: qty 16

## 2021-09-01 MED ORDER — DIPHENHYDRAMINE HCL 50 MG/ML IJ SOLN
25.0000 mg | Freq: Once | INTRAMUSCULAR | Status: AC
  Administered 2021-09-01: 25 mg via INTRAVENOUS
  Filled 2021-09-01: qty 1

## 2021-09-01 MED ORDER — SODIUM CHLORIDE 0.9 % IV SOLN: Freq: Once | INTRAVENOUS | Status: AC

## 2021-09-01 NOTE — Progress Notes (Signed)
Ipilimumab (YERVOY) Patient Monitoring Assessment   Is the patient experiencing any of the following general symptoms?:  [x] Patient is not experiencing any of the general symptoms listed in this section.  [] Difficulty performing normal activities [] Feeling sluggish or cold all the time [] Unusual weight gain [] Constant or unusual headaches [] Feeling dizzy or faint [] Changes in eyesight (blurry vision, double vision, or other vision problems) [] Changes in mood or behavior (ex: decreased sex drive, irritability, or forgetfulness) [] Starting new medications (ex: steroids, other medications that lower immune response)   Gastrointestinal  Patient is having 1 bowel movements each day.  Is this different from baseline? [x] Yes [] No Are your stools watery or do they have a foul smell? [] Yes [x] No Have you seen blood in your stools? [] Yes [x] No Are your stools dark, tarry, or sticky? [] Yes [x] No Are you having pain or tenderness in your belly? [] Yes [x] No  Skin Does your skin itch? [x] Yes [] No Do you have a rash? [] Yes [x] No Has your skin blistered and/or peeled? [] Yes [x] No Do you have sores in your mouth? [] Yes [x] No  Hepatic Has your urine been dark or tea colored? [] Yes [x] No Have you noticed your skin or the whites of your eyes are turning yellow? [] Yes [x] No Are you bleeding or bruising more easily than normal? [] Yes [x] No Are you nauseous and/or vomiting? [] Yes [x] No Do you have pain on the right side of your stomach? [] Yes [x] No  Neurologic  Are you having unusual weakness of legs, arms, or face? [] Yes [x] No Are you having numbness or tingling in your hands or feet? [] Yes [x] No  Tildon Husky

## 2021-09-01 NOTE — Patient Instructions (Signed)
[facility] ONCOLOG[facility]  Discharge Instructions: Thank you for choosing [facility] to provide your oncology and hematology care.   If you have a lab appointment with the [facility], please go directly to the [facility] and check in at the registration area.   Wear comfortable clothing and clothing appropriate for easy access to any Portacath or PICC line.   We strive to give you quality time with your provider. You may need to reschedule your appointment if you arrive late (15 or more minutes).  Arriving late affects you and other patients whose appointments are after yours.  Also, if you miss three or more appointments without notifying the office, you may be dismissed from the clinic at the provider's discretion.      For prescription refill requests, have your pharmacy contact our office and allow 72 hours for refills to be completed.    Today you received the following chemotherapy and/or immunotherapy agents: Rae Halsted      To help prevent nausea and vomiting after your treatment, we encourage you to take your nausea medication as directed.  BELOW ARE SYMPTOMS THAT SHOULD BE REPORTED IMMEDIATELY: *FEVER GREATER THAN 100.4 F (38 C) OR HIGHER *CHILLS OR SWEATING *NAUSEA AND VOMITING THAT IS NOT CONTROLLED WITH YOUR NAUSEA MEDICATION *UNUSUAL SHORTNESS OF BREATH *UNUSUAL BRUISING OR BLEEDING *URINARY PROBLEMS (pain or burning when urinating, or frequent urination) *BOWEL PROBLEMS (unusual diarrhea, constipation, pain near the anus) TENDERNESS IN MOUTH AND THROAT WITH OR WITHOUT PRESENCE OF ULCERS (sore throat, sores in mouth, or a toothache) UNUSUAL RASH, SWELLING OR PAIN  UNUSUAL VAGINAL DISCHARGE OR ITCHING   Items with * indicate a potential emergency and should be followed up as soon as possible or go to the Emergency Department if any problems should occur.  Please show the CHEMOTHERAPY ALERT CARD or IMMUNOTHERAPY ALERT CARD at  check-in to the Emergency Department and triage nurse.  Should you have questions after your visit or need to cancel or reschedule your appointment, please contact [facility]  Dept: [phone]  and follow the prompts.  Office hours are 8:00 a.m. to 4:30 p.m. Monday - Friday. Please note that voicemails left after 4:00 p.m. may not be returned until the following business day.  We are closed weekends and major holidays. You have access to a nurse at all times for urgent questions. Please call the main number to the clinic Dept: [phone] and follow the prompts.   For any non-urgent questions, you may also contact your provider using MyChart. We now offer e-Visits for anyone 83 and older to request care online for non-urgent symptoms. For details visit [email or web address].   Also download the MyChart app! Go to the app store, search "MyChart", open the app, select [facility], and log in with your MyChart username and password.  Masks are optional in the cancer centers. If you would like for your care team to wear a mask while they are taking care of you, please let them know. For doctor visits, patients may have with them one support person who is at least 83 years old. At this time, visitors are not allowed in the infusion area.

## 2021-09-01 NOTE — Progress Notes (Signed)
[place] [phone]   [phone]  OFFICE PROGRESS NOTE  Shaun Kennedy, [address] [address]  DIAGNOSIS: stage IV (T3, N2, M1 a) non-small cell Kennedy cancer, squamous cell carcinoma presented with obstructive left lower lobe Kennedy mass in addition to mediastinal lymphadenopathy as well as bilateral pulmonary nodules diagnosed in July 2021.   PDL1: 0%   PRIOR THERAPY: None   CURRENT THERAPY: 1) Palliative radiotherapy to the obstructive left lower lobe Kennedy mass under the care of Dr. Lisbeth Renshaw.  2)  systemic chemotherapy 2 cycles of chemotherapy with carboplatin for an AUC of 5 and paclitaxel 175 mg/m2 in addition to immunotherapy with nivolumab 360 mg every 3 weeks and ipilimumab 1 mg/kg IV every 6 weeks followed by maintenance nivolumab and ipilimumab.  He started the first treatment on 09/04/2019.  He is status post 17 cycles.  INTERVAL HISTORY: Shaun Kennedy 81 y.o. male returns to the clinic today for follow-up visit.  The patient is feeling fine today with no concerning complaints except for mild shortness of breath with exertion.  He has no cough or hemoptysis.  He has no nausea, vomiting, diarrhea or constipation.  He has no headache or visual changes.  He denied having any recent weight loss or night sweats.  He continues to tolerate his treatment with ipilimumab and nivolumab fairly well except for mild itching.  The patient had repeat CT scan of the chest, abdomen and pelvis performed recently and he is here for evaluation and discussion of his scan results.  MEDICAL HISTORY: Past Medical History:  Diagnosis Date   Allergic rhinitis    Asthma    Carotid stenosis    Colon cancer ([place]) 2003   Diabetes mellitus ([place])    Diverticulosis    Dyslipidemia    ED (erectile dysfunction)    GERD (gastroesophageal reflux disease)    H/O degenerative disc disease    Hemorrhoids    HTN (hypertension)    as a child    Hyperlipidemia    Kennedy cancer ([place])    LVH (left ventricular hypertrophy)    on EKG   Mass of lower lobe of left Kennedy    Mediastinal adenopathy    Smoker    former   Wears dentures    Wears dentures    Wears glasses    Wears glasses     ALLERGIES:  has No Known Allergies.  MEDICATIONS:  Current Outpatient Medications  Medication Sig Dispense Refill   Alcohol Swabs (DROPSAFE ALCOHOL PREP) 70 % PADS Apply topically.     amLODipine (NORVASC) 5 MG tablet TAKE 1 TABLET EVERY DAY 90 tablet 3   aspirin EC 81 MG tablet Take 81 mg by mouth daily after breakfast.      bisacodyl (DULCOLAX) 5 MG EC tablet Take 10 mg by mouth daily as needed for moderate constipation.     Blood Glucose Calibration (TRUE METRIX LEVEL 1) Low SOLN      cholecalciferol (VITAMIN D) 25 MCG (1000 UNIT) tablet Take 1,000 Units by mouth daily.     donepezil (ARICEPT) 5 MG tablet Take 1 tablet (5 mg total) by mouth at bedtime. 90 tablet 0   dronabinol (MARINOL) 2.5 MG capsule Take 1 capsule (2.5 mg total) by mouth 2 (two) times daily before a meal. 60 capsule 1   ferrous sulfate 325 (65 FE) MG EC tablet TAKE 1 TABLET (325 MG TOTAL) DAILY WITH BREAKFAST. 90 tablet 1  gabapentin (NEURONTIN) 100 MG capsule Take by mouth.     hydrOXYzine (ATARAX) 10 MG tablet Take 1 tablet (10 mg total) by mouth 3 (three) times daily as needed. 30 tablet 1   Lancet Devices (TRUEDRAW LANCING DEVICE) MISC Check sugars daily 90 each 0   linaclotide (LINZESS) 72 MCG capsule Take by mouth.     lisinopril-hydrochlorothiazide (ZESTORETIC) 20-12.5 MG tablet TAKE 1 TABLET EVERY DAY 90 tablet 1   metFORMIN (GLUCOPHAGE-XR) 500 MG 24 hr tablet TAKE 1 TABLET EVERY DAY 90 tablet 1   Multiple Vitamin (MULTIVITAMIN WITH MINERALS) TABS tablet Take 1 tablet by mouth daily after breakfast.      simvastatin (ZOCOR) 40 MG tablet TAKE 1 TABLET EVERY DAY 90 tablet 1   solifenacin (VESICARE) 5 MG tablet TAKE 1 TABLET EVERY DAY 90 tablet 1   triamcinolone cream  (KENALOG) 0.1 % APPLY 1 CREAM EXTERNALLY TWICE DAILY 30 g 0   TRUE METRIX BLOOD GLUCOSE TEST test strip SMARTSIG:Via Meter     TRUEplus Lancets 33G MISC 1 each by Does not apply route 2 (two) times daily. 200 each 4   No current facility-administered medications for this visit.   Facility-Administered Medications Ordered in Other Visits  Medication Dose Route Frequency Provider Last Rate Last Admin   sodium chloride flush (NS) 0.9 % injection 10 mL  10 mL Intracatheter PRN Curt Bears, MD   10 mL at 09/04/19 1556    SURGICAL HISTORY:  Past Surgical History:  Procedure Laterality Date   BRONCHIAL BIOPSY  08/20/2019   Procedure: BRONCHIAL BIOPSIES;  Surgeon: Collene Gobble, MD;  Location: [facility] ENDOSCOPY;  Service: Pulmonary;;   BRONCHIAL BRUSHINGS  08/20/2019   Procedure: BRONCHIAL BRUSHINGS;  Surgeon: Collene Gobble, MD;  Location: [facility] ENDOSCOPY;  Service: Pulmonary;;   BRONCHIAL NEEDLE ASPIRATION BIOPSY  08/20/2019   Procedure: BRONCHIAL NEEDLE ASPIRATION BIOPSIES;  Surgeon: Collene Gobble, MD;  Location: [place];  Service: Pulmonary;;   CARPAL TUNNEL RELEASE Right 01/09/2019   Procedure: CARPAL TUNNEL RELEASE;  Surgeon: Leandrew Koyanagi, MD;  Location: [place];  Service: Orthopedics;  Laterality: Right;   CARPAL TUNNEL WITH CUBITAL TUNNEL Right 11/18/2020   Procedure: CARPAL TUNNEL WITH CUBITAL TUNNEL;  Surgeon: Charlotte Crumb, MD;  Location: [place];  Service: Orthopedics;  Laterality: Right;   CATARACT EXTRACTION Right 2018   COLONOSCOPY  2007   Dr. Benson Norway   DIRECT LARYNGOSCOPY N/A 04/10/2020   Procedure: DIRECT LARYNGOSCOPY;  Surgeon: Melida Quitter, MD;  Location: [place];  Service: ENT;  Laterality: N/A;   I & D EXTREMITY Right 04/02/2016   Procedure: IRRIGATION AND DEBRIDEMENT GREAT TOE;  Surgeon: Leandrew Koyanagi, MD;  Location: [place];  Service: Orthopedics;  Laterality: Right;   IR IMAGING GUIDED PORT INSERTION  08/30/2019   MULTIPLE TOOTH EXTRACTIONS      RADIOACTIVE SEED IMPLANT  2003   TRACHEOSTOMY TUBE PLACEMENT N/A 04/10/2020   Procedure: TRACHEOSTOMY;  Surgeon: Melida Quitter, MD;  Location: [facility];  Service: ENT;  Laterality: N/A;   ULNAR TUNNEL RELEASE Right 01/09/2019   Procedure: RIGHT CUBITAL TUNNEL RELEASE AND CARPAL TUNNEL RELEASE;  Surgeon: Leandrew Koyanagi, MD;  Location: [facility];  Service: Orthopedics;  Laterality: Right;   UPPER GASTROINTESTINAL ENDOSCOPY     VIDEO BRONCHOSCOPY N/A 12/18/2019   Procedure: VIDEO BRONCHOSCOPY WITHOUT FLUORO;  Surgeon: Collene Gobble, MD;  Location: [facility] ENDOSCOPY;  Service: Cardiopulmonary;  Laterality: N/A;   VIDEO BRONCHOSCOPY WITH ENDOBRONCHIAL ULTRASOUND N/A  08/20/2019   Procedure: VIDEO BRONCHOSCOPY WITH ENDOBRONCHIAL ULTRASOUND;  Surgeon: Collene Gobble, MD;  Location: [facility] ENDOSCOPY;  Service: Pulmonary;  Laterality: N/A;    REVIEW OF SYSTEMS:  Constitutional: positive for fatigue Eyes: negative Ears, nose, mouth, throat, and face: negative Respiratory: negative Cardiovascular: negative Gastrointestinal: negative Genitourinary:negative Integument/breast: positive for pruritus Hematologic/lymphatic: negative Musculoskeletal:negative Neurological: negative Behavioral/Psych: negative Endocrine: negative Allergic/Immunologic: negative   PHYSICAL EXAMINATION: General appearance: alert, cooperative, fatigued, and no distress Head: Normocephalic, without obvious abnormality, atraumatic Neck: no adenopathy, no JVD, supple, symmetrical, trachea midline, and thyroid not enlarged, symmetric, no tenderness/mass/nodules Lymph nodes: Cervical, supraclavicular, and axillary nodes normal. Resp: clear to auscultation bilaterally Back: symmetric, no curvature. ROM normal. No CVA tenderness. Cardio: regular rate and rhythm, S1, S2 normal, no murmur, click, rub or gallop GI: soft, non-tender; bowel sounds normal; no masses,  no organomegaly Extremities: extremities normal, atraumatic, no  cyanosis or edema Neurologic: Alert and oriented X 3, normal strength and tone. Normal symmetric reflexes. Normal coordination and gait  ECOG PERFORMANCE STATUS: 1 - Symptomatic but completely ambulatory  Blood pressure 131/63, pulse (!) 58, temperature (!) 97.1 F (36.2 C), temperature source Tympanic, resp. rate 18, weight 183 lb 4 oz (83.1 kg), SpO2 100 %.  LABORATORY DATA: Lab Results  Component Value Date   WBC 6.4 08/11/2021   HGB 10.4 (L) 08/11/2021   HCT 31.0 (L) 08/11/2021   MCV 93.7 08/11/2021   PLT 310 08/11/2021      Chemistry      Component Value Date/Time   NA 140 08/11/2021 0749   NA 132 (L) 08/14/2019 0918   K 3.8 08/11/2021 0749   CL 108 08/11/2021 0749   CO2 26 08/11/2021 0749   BUN 25 (H) 08/11/2021 0749   BUN 16 08/14/2019 0918   CREATININE 1.38 (H) 08/11/2021 0749   CREATININE 1.25 (H) 08/08/2016 0912      Component Value Date/Time   CALCIUM 9.0 08/11/2021 0749   ALKPHOS 59 08/11/2021 0749   AST 15 08/11/2021 0749   ALT 13 08/11/2021 0749   BILITOT 0.5 08/11/2021 0749       RADIOGRAPHIC STUDIES: CT Chest W Contrast  Result Date: 08/31/2021 CLINICAL DATA:  Non-small cell Kennedy cancer. Assess treatment response. * Tracking Code: BO * EXAM: CT CHEST, ABDOMEN, AND PELVIS WITH CONTRAST TECHNIQUE: Multidetector CT imaging of the chest, abdomen and pelvis was performed following the standard protocol during bolus administration of intravenous contrast. RADIATION DOSE REDUCTION: This exam was performed according to the departmental dose-optimization program which includes automated exposure control, adjustment of the mA and/or kV according to patient size and/or use of iterative reconstruction technique. CONTRAST:  150m OMNIPAQUE IOHEXOL 300 MG/ML  SOLN COMPARISON:  Prior CTs 06/04/2021 and 03/02/2021. FINDINGS: CT CHEST FINDINGS Cardiovascular: Suboptimal contrast bolus. Al allowing for this, no acute vascular findings are identified. Right IJ Port-A-Cath  extends to the lower SVC level. Extensive coronary artery atherosclerosis with lesser involvement of the aorta and great vessels. The heart size is stable. No significant pericardial effusion. Mediastinum/Nodes: There are no enlarged mediastinal, hilar or axillary lymph nodes. Tracheostomy remains in place. Possible mild chronic wall thickening of the distal esophagus. Lungs/Pleura: Chronic pleural thickening posteriorly on the left. No significant pleural effusion or pneumothorax. There are chronic radiation changes in the left perihilar Kennedy with volume loss, architectural distortion and para mediastinal fibrosis. A previously demonstrated subpleural nodule in the left lower lobe is partly obscured by breathing artifact today, although appears similar, measuring 8 mm  on image 93/7. No new or enlarging pulmonary nodules. Mild centrilobular and paraseptal emphysema. Musculoskeletal/Chest wall: No chest wall mass or suspicious osseous findings. Scattered ill-defined lucencies within the thoracic spine are stable, considered benign based on stability. CT ABDOMEN AND PELVIS FINDINGS Hepatobiliary: Hepatic assessment limited by suboptimal contrast bolus. The liver appears grossly unchanged, without focal abnormality. The gallbladder is incompletely distended. No evidence of gallstones, gallbladder wall thickening or biliary dilatation. Pancreas: Unremarkable. No pancreatic ductal dilatation or surrounding inflammatory changes. Spleen: Normal in size without focal abnormality. Adrenals/Urinary Tract: Both adrenal glands appear normal. The kidneys appear stable without urinary tract calculus, hydronephrosis or suspicious lesion. There is a stable small parapelvic cyst in the upper pole of the right kidney for which no follow-up imaging is recommended. Mild bladder wall thickening attributed to incomplete distension. No focal bladder abnormality identified. Stomach/Bowel: Enteric contrast was administered and has passed  into the distal small bowel. The stomach appears unremarkable for its degree of distension. No evidence of bowel wall thickening, distention or surrounding inflammatory change. Diverticular changes throughout the descending and sigmoid colon. The appendix appears normal. Vascular/Lymphatic: There are no enlarged abdominal or pelvic lymph nodes. Aortic and branch vessel atherosclerosis without evidence of aneurysm. Reproductive: Prostate brachytherapy seeds. Other: Interval near complete resolution of previously demonstrated complex fluid collection within a right inguinal hernia. There is mild asymmetric soft tissue thickening in this area. No herniated bowel. Small umbilical hernia containing only fat. No ascites, free air or peritoneal nodularity. Musculoskeletal: No acute or significant osseous findings. Stable multilevel endplate degenerative changes throughout the lumbar spine. IMPRESSION: 1. Stable treatment changes in the left hemithorax. 2. No evidence of local recurrence or metastatic disease. Small pulmonary nodules are grossly unchanged, considered benign based on stability. 3. Resolving complex fluid collection within a right inguinal hernia. 4. Distal colonic diverticulosis. 5. Coronary and aortic atherosclerosis (ICD10-I70.0). Emphysema (ICD10-J43.9). Electronically Signed   By: Richardean Sale M.D.   On: 08/31/2021 19:57   CT Abdomen Pelvis W Contrast  Result Date: 08/31/2021 CLINICAL DATA:  Non-small cell Kennedy cancer. Assess treatment response. * Tracking Code: BO * EXAM: CT CHEST, ABDOMEN, AND PELVIS WITH CONTRAST TECHNIQUE: Multidetector CT imaging of the chest, abdomen and pelvis was performed following the standard protocol during bolus administration of intravenous contrast. RADIATION DOSE REDUCTION: This exam was performed according to the departmental dose-optimization program which includes automated exposure control, adjustment of the mA and/or kV according to patient size and/or use of  iterative reconstruction technique. CONTRAST:  178m OMNIPAQUE IOHEXOL 300 MG/ML  SOLN COMPARISON:  Prior CTs 06/04/2021 and 03/02/2021. FINDINGS: CT CHEST FINDINGS Cardiovascular: Suboptimal contrast bolus. Al allowing for this, no acute vascular findings are identified. Right IJ Port-A-Cath extends to the lower SVC level. Extensive coronary artery atherosclerosis with lesser involvement of the aorta and great vessels. The heart size is stable. No significant pericardial effusion. Mediastinum/Nodes: There are no enlarged mediastinal, hilar or axillary lymph nodes. Tracheostomy remains in place. Possible mild chronic wall thickening of the distal esophagus. Lungs/Pleura: Chronic pleural thickening posteriorly on the left. No significant pleural effusion or pneumothorax. There are chronic radiation changes in the left perihilar Kennedy with volume loss, architectural distortion and para mediastinal fibrosis. A previously demonstrated subpleural nodule in the left lower lobe is partly obscured by breathing artifact today, although appears similar, measuring 8 mm on image 93/7. No new or enlarging pulmonary nodules. Mild centrilobular and paraseptal emphysema. Musculoskeletal/Chest wall: No chest wall mass or suspicious osseous findings. Scattered ill-defined lucencies  within the thoracic spine are stable, considered benign based on stability. CT ABDOMEN AND PELVIS FINDINGS Hepatobiliary: Hepatic assessment limited by suboptimal contrast bolus. The liver appears grossly unchanged, without focal abnormality. The gallbladder is incompletely distended. No evidence of gallstones, gallbladder wall thickening or biliary dilatation. Pancreas: Unremarkable. No pancreatic ductal dilatation or surrounding inflammatory changes. Spleen: Normal in size without focal abnormality. Adrenals/Urinary Tract: Both adrenal glands appear normal. The kidneys appear stable without urinary tract calculus, hydronephrosis or suspicious lesion.  There is a stable small parapelvic cyst in the upper pole of the right kidney for which no follow-up imaging is recommended. Mild bladder wall thickening attributed to incomplete distension. No focal bladder abnormality identified. Stomach/Bowel: Enteric contrast was administered and has passed into the distal small bowel. The stomach appears unremarkable for its degree of distension. No evidence of bowel wall thickening, distention or surrounding inflammatory change. Diverticular changes throughout the descending and sigmoid colon. The appendix appears normal. Vascular/Lymphatic: There are no enlarged abdominal or pelvic lymph nodes. Aortic and branch vessel atherosclerosis without evidence of aneurysm. Reproductive: Prostate brachytherapy seeds. Other: Interval near complete resolution of previously demonstrated complex fluid collection within a right inguinal hernia. There is mild asymmetric soft tissue thickening in this area. No herniated bowel. Small umbilical hernia containing only fat. No ascites, free air or peritoneal nodularity. Musculoskeletal: No acute or significant osseous findings. Stable multilevel endplate degenerative changes throughout the lumbar spine. IMPRESSION: 1. Stable treatment changes in the left hemithorax. 2. No evidence of local recurrence or metastatic disease. Small pulmonary nodules are grossly unchanged, considered benign based on stability. 3. Resolving complex fluid collection within a right inguinal hernia. 4. Distal colonic diverticulosis. 5. Coronary and aortic atherosclerosis (ICD10-I70.0). Emphysema (ICD10-J43.9). Electronically Signed   By: Richardean Sale M.D.   On: 08/31/2021 19:57    ASSESSMENT AND PLAN: This is a very pleasant 81 years old [name] with stage IV non-small cell Kennedy cancer, squamous cell carcinoma with negative PD-L1 expression. The patient is currently undergoing systemic chemotherapy with carboplatin and paclitaxel for 2 cycles in  addition to immunotherapy with ipilimumab 1 mg/KG every 6 weeks and nivolumab 360 mg IV every 3 weeks status post 17 cycles. The patient has been tolerating this treatment well with no concerning adverse effect except for mild itching. He had repeat CT scan of the chest, abdomen and pelvis performed recently.  I personally and independently reviewed the scan and discussed the result with the patient today. His scan showed no concerning findings for disease progression. I recommended for him to continue his current treatment and he will start day 1 of cycle #18 today. For the itching he was advised to take Benadryl or Claritin on as-needed basis. The patient was advised to call immediately if he has any concerning symptoms in the interval. The patient voices understanding of current disease status and treatment options and is in agreement with the current care plan.  All questions were answered. The patient knows to call the clinic with any problems, questions or concerns. We can certainly see the patient much sooner if necessary.  Disclaimer: This note was dictated with voice recognition software. Similar sounding words can inadvertently be transcribed and may not be corrected upon review.

## 2021-09-02 ENCOUNTER — Other Ambulatory Visit: Payer: Self-pay

## 2021-09-04 ENCOUNTER — Encounter: Payer: Self-pay | Admitting: Family Medicine

## 2021-09-05 ENCOUNTER — Other Ambulatory Visit: Payer: Self-pay

## 2021-09-06 ENCOUNTER — Ambulatory Visit (INDEPENDENT_AMBULATORY_CARE_PROVIDER_SITE_OTHER): Payer: Medicare HMO | Admitting: Medical

## 2021-09-06 VITALS — BP 122/70 | HR 79 | Wt 184.8 lb

## 2021-09-06 DIAGNOSIS — Z87891 Personal history of nicotine dependence: Secondary | ICD-10-CM

## 2021-09-06 DIAGNOSIS — C3492 Malignant neoplasm of unspecified part of left bronchus or lung: Secondary | ICD-10-CM | POA: Diagnosis not present

## 2021-09-06 DIAGNOSIS — R21 Rash and other nonspecific skin eruption: Secondary | ICD-10-CM | POA: Diagnosis not present

## 2021-09-06 DIAGNOSIS — M7989 Other specified soft tissue disorders: Secondary | ICD-10-CM | POA: Diagnosis not present

## 2021-09-06 DIAGNOSIS — E1121 Type 2 diabetes mellitus with diabetic nephropathy: Secondary | ICD-10-CM | POA: Diagnosis not present

## 2021-09-06 DIAGNOSIS — E1159 Type 2 diabetes mellitus with other circulatory complications: Secondary | ICD-10-CM | POA: Diagnosis not present

## 2021-09-06 DIAGNOSIS — Z930 Tracheostomy status: Secondary | ICD-10-CM

## 2021-09-06 DIAGNOSIS — D6481 Anemia due to antineoplastic chemotherapy: Secondary | ICD-10-CM | POA: Diagnosis not present

## 2021-09-06 DIAGNOSIS — T451X5A Adverse effect of antineoplastic and immunosuppressive drugs, initial encounter: Secondary | ICD-10-CM

## 2021-09-06 DIAGNOSIS — I700 Atherosclerosis of aorta: Secondary | ICD-10-CM

## 2021-09-06 DIAGNOSIS — I152 Hypertension secondary to endocrine disorders: Secondary | ICD-10-CM

## 2021-09-06 DIAGNOSIS — E119 Type 2 diabetes mellitus without complications: Secondary | ICD-10-CM | POA: Diagnosis not present

## 2021-09-06 MED ORDER — BETAMETHASONE VALERATE 0.1 % EX OINT: 1.0000 | TOPICAL_OINTMENT | Freq: Two times a day (BID) | CUTANEOUS | 0 refills | Status: DC

## 2021-09-06 MED ORDER — PREDNISONE 20 MG PO TABS: ORAL_TABLET | ORAL | 0 refills | Status: DC

## 2021-09-06 MED ORDER — HYDROXYZINE HCL 10 MG PO TABS: 10.0000 mg | ORAL_TABLET | Freq: Three times a day (TID) | ORAL | 1 refills | Status: DC | PRN

## 2021-09-06 NOTE — Progress Notes (Signed)
Subjective:  Shaun Kennedy is a 81 y.o. male who presents for Chief Complaint  Patient presents with   Joint Swelling    Ankles swelling for a couple days. Dry skin, itching all over x 1-2 months     Here for a few concerns.  Here with wife today.  He notes being itchy all over, neck to feet for about 2 months.  No new changes in soaps or hygeine products.  No new clothes, doesn't use cologne.  Been using new psoriasis cream OTC and it seemed to help.  Vaseline for weeks didn't help.  Right ankle swelling some as well.  He didn't notice til his daughter took note of it.  He doesn't recall any signifnicat ankle swelling.  No other swelling.  No dyspnea, no SOB. Shaun Kennedy been in usual state of health.  No weight changes, no other new medications.  No other aggravating or relieving factors.    No other c/o.  Past Medical History:  Diagnosis Date   Allergic rhinitis    Asthma    Carotid stenosis    Colon cancer ([place]) 2003   Diabetes mellitus ([place])    Diverticulosis    Dyslipidemia    ED (erectile dysfunction)    GERD (gastroesophageal reflux disease)    H/O degenerative disc disease    Hemorrhoids    HTN (hypertension)    as a child   Hyperlipidemia    Lung cancer ([place])    LVH (left ventricular hypertrophy)    on EKG   Mass of lower lobe of left lung    Mediastinal adenopathy    Smoker    former   Wears dentures    Wears dentures    Wears glasses    Wears glasses    Current Outpatient Medications on File Prior to Visit  Medication Sig Dispense Refill   amLODipine (NORVASC) 5 MG tablet TAKE 1 TABLET EVERY DAY 90 tablet 3   aspirin EC 81 MG tablet Take 81 mg by mouth daily after breakfast.      bisacodyl (DULCOLAX) 5 MG EC tablet Take 10 mg by mouth daily as needed for moderate constipation.     cholecalciferol (VITAMIN D) 25 MCG (1000 UNIT) tablet Take 1,000 Units by mouth daily.     donepezil (ARICEPT) 5 MG tablet Take 1 tablet (5 mg total) by mouth at bedtime. 90 tablet 0    dronabinol (MARINOL) 2.5 MG capsule Take 1 capsule (2.5 mg total) by mouth 2 (two) times daily before a meal. 60 capsule 1   ferrous sulfate 325 (65 FE) MG EC tablet TAKE 1 TABLET (325 MG TOTAL) DAILY WITH BREAKFAST. 90 tablet 1   gabapentin (NEURONTIN) 100 MG capsule Take by mouth.     linaclotide (LINZESS) 72 MCG capsule Take by mouth.     lisinopril-hydrochlorothiazide (ZESTORETIC) 20-12.5 MG tablet TAKE 1 TABLET EVERY DAY 90 tablet 1   metFORMIN (GLUCOPHAGE-XR) 500 MG 24 hr tablet TAKE 1 TABLET EVERY DAY 90 tablet 0   Multiple Vitamin (MULTIVITAMIN WITH MINERALS) TABS tablet Take 1 tablet by mouth daily after breakfast.      simvastatin (ZOCOR) 40 MG tablet TAKE 1 TABLET EVERY DAY 90 tablet 1   solifenacin (VESICARE) 5 MG tablet TAKE 1 TABLET EVERY DAY 90 tablet 1   Alcohol Swabs (DROPSAFE ALCOHOL PREP) 70 % PADS Apply topically.     Blood Glucose Calibration (TRUE METRIX LEVEL 1) Low SOLN      Lancet Devices (TRUEDRAW LANCING DEVICE) MISC  Check sugars daily 90 each 0   TRUE METRIX BLOOD GLUCOSE TEST test strip SMARTSIG:Via Meter     TRUEplus Lancets 33G MISC 1 each by Does not apply route 2 (two) times daily. 200 each 4   Current Facility-Administered Medications on File Prior to Visit  Medication Dose Route Frequency Provider Last Rate Last Admin   sodium chloride flush (NS) 0.9 % injection 10 mL  10 mL Intracatheter PRN Curt Bears, MD   10 mL at 09/04/19 1556     The following portions of the patient's history were reviewed and updated as appropriate: allergies, current medications, past family history, past medical history, past social history, past surgical history and problem list.  ROS Otherwise as in subjective above  Objective: BP 122/70   Pulse 79   Wt 184 lb 12.8 oz (83.8 kg)   BMI 28.94 kg/m   General appearance: alert, no distress, well developed, well nourished Tracheotomy in place Neck: supple, no lymphadenopathy, no thyromegaly, no masses Lungs:  decrease lower fields in genearl, no wheezes, rhonchi, or rales Heart rrr, normla s1, s2, no murmur Rough pink/red skin along bilat foreams in general, similar diffuse red/pink somewhat tight skin of bilat lower legs Pulses: 2+ radial pulses, 2+ pedal pulses, normal cap refill Ext: 1+ nonpitting edema of right lower leg, no other edema    Assessment: Encounter Diagnoses  Name Primary?   Rash Yes   Leg swelling    Tracheostomy status (HCC)    Non-small cell cancer of left lung ([place])    Hypertension associated with diabetes ([place])    Former smoker, stopped smoking in distant past    Diabetes mellitus type II, non insulin dependent ([place])    Diabetic nephropathy associated with type 2 diabetes mellitus ([place])    Antineoplastic chemotherapy induced anemia    Atherosclerosis of aorta (HCC)      Plan: We discussed his rash that has been present for 2 months, the new swelling.  We discussed possible causes or triggers.  Changes to medications below as the milder steroid cream was not clearing this up completely.  Add prednisone.  Continue hydroxyzine.  Additional labs today to further evaluate.  He likely has some venous insufficiency of the legs that may be contributing to this issue.  Consider referral to vascular  Shaun Kennedy was seen today for joint swelling.  Diagnoses and all orders for this visit:  Rash -     Iron -     Sedimentation rate -     Brain natriuretic peptide -     Ambulatory referral to Dermatology  Leg swelling -     Iron -     Sedimentation rate -     Brain natriuretic peptide  Tracheostomy status (HCC)  Non-small cell cancer of left lung ([place])  Hypertension associated with diabetes ([place])  Former smoker, stopped smoking in distant past  Diabetes mellitus type II, non insulin dependent ([place])  Diabetic nephropathy associated with type 2 diabetes mellitus (HCC)  Antineoplastic chemotherapy induced anemia  Atherosclerosis of aorta (HCC)  Other  orders -     hydrOXYzine (ATARAX) 10 MG tablet; Take 1 tablet (10 mg total) by mouth 3 (three) times daily as needed. -     betamethasone valerate ointment (VALISONE) 0.1 %; Apply 1 Application topically 2 (two) times daily. -     predniSONE (DELTASONE) 20 MG tablet; 2 tablets daily x 5 days    Follow up: pending labs

## 2021-09-07 ENCOUNTER — Other Ambulatory Visit: Payer: Self-pay | Admitting: Medical

## 2021-09-07 ENCOUNTER — Ambulatory Visit: Payer: Medicare HMO | Admitting: Medical

## 2021-09-07 DIAGNOSIS — R609 Edema, unspecified: Secondary | ICD-10-CM

## 2021-09-07 DIAGNOSIS — R21 Rash and other nonspecific skin eruption: Secondary | ICD-10-CM

## 2021-09-07 DIAGNOSIS — I152 Hypertension secondary to endocrine disorders: Secondary | ICD-10-CM

## 2021-09-07 LAB — BRAIN NATRIURETIC PEPTIDE: BNP: 70.7 pg/mL (ref 0.0–100.0)

## 2021-09-07 LAB — SEDIMENTATION RATE: Sed Rate: 31 mm/hr — ABNORMAL HIGH (ref 0–30)

## 2021-09-07 LAB — IRON: Iron: 70 ug/dL (ref 38–169)

## 2021-09-17 ENCOUNTER — Other Ambulatory Visit: Payer: Self-pay

## 2021-09-17 NOTE — Progress Notes (Deleted)
[facility] OFFICE PROGRESS NOTE  [name], [place] [address]  DIAGNOSIS: Stage IV (T3, N2, M1 a) non-small cell lung cancer, squamous cell carcinoma presented with obstructive left lower lobe lung mass in addition to mediastinal lymphadenopathy as well as bilateral pulmonary nodules diagnosed in July 2021.   PDL1: 0%  PRIOR THERAPY:  Palliative radiotherapy to the obstructive left lower lobe lung mass under the care of Dr. Lisbeth Renshaw. Completed on 09/20/19  CURRENT THERAPY: Systemic chemotherapy 2 cycles of chemotherapy with carboplatin for an AUC of 5 and paclitaxel 175 mg/m2 in addition to immunotherapy with nivolumab 360 mg every 3 weeks and ipilimumab 1 mg/kg IV every 6 weeks followed by maintenance nivolumab and ipilimumab.  He started the first treatment on 09/04/2019.  He is status post 18 cycles of treatment.     INTERVAL HISTORY: Shaun Kennedy 81 y.o. male returns  to the clinic today for a follow up visit. The patient is feeling well today without any concerning complaints. He follows closely with ENT for his tracheostomy.  The patient continues to tolerate treatment with immunotherapy well without any adverse side effects. Denies any fever, chills, night sweats, or weight loss. Denies any chest pain or hemoptysis. He sometimes has a cough related to mucus production getting caught in his tracheostomy. He has been taking mucinex. He denies any shortness of breath unless with certain strenuous activities he may get winded. He is fairly active with yard work when it is not too hot outside. Denies any nausea, vomiting, constipation, or diarrhea. Denies any headache or visual changes. Denies any rashes or skin changes. The patient is here today for evaluation prior to starting cycle #18 day 22.   MEDICAL HISTORY: Past Medical History:  Diagnosis Date   Allergic rhinitis    Asthma    Carotid stenosis    Colon cancer ([place]) 2003   Diabetes  mellitus ([place])    Diverticulosis    Dyslipidemia    ED (erectile dysfunction)    GERD (gastroesophageal reflux disease)    H/O degenerative disc disease    Hemorrhoids    HTN (hypertension)    as a child   Hyperlipidemia    Lung cancer ([place])    LVH (left ventricular hypertrophy)    on EKG   Mass of lower lobe of left lung    Mediastinal adenopathy    Smoker    former   Wears dentures    Wears dentures    Wears glasses    Wears glasses     ALLERGIES:  has No Known Allergies.  MEDICATIONS:  Current Outpatient Medications  Medication Sig Dispense Refill   Alcohol Swabs (DROPSAFE ALCOHOL PREP) 70 % PADS Apply topically.     amLODipine (NORVASC) 5 MG tablet TAKE 1 TABLET EVERY DAY 90 tablet 3   aspirin EC 81 MG tablet Take 81 mg by mouth daily after breakfast.      betamethasone valerate ointment (VALISONE) 0.1 % Apply 1 Application topically 2 (two) times daily. 45 g 0   bisacodyl (DULCOLAX) 5 MG EC tablet Take 10 mg by mouth daily as needed for moderate constipation.     Blood Glucose Calibration (TRUE METRIX LEVEL 1) Low SOLN      cholecalciferol (VITAMIN D) 25 MCG (1000 UNIT) tablet Take 1,000 Units by mouth daily.     donepezil (ARICEPT) 5 MG tablet Take 1 tablet (5 mg total) by mouth at bedtime. 90 tablet 0  dronabinol (MARINOL) 2.5 MG capsule Take 1 capsule (2.5 mg total) by mouth 2 (two) times daily before a meal. 60 capsule 1   ferrous sulfate 325 (65 FE) MG EC tablet TAKE 1 TABLET (325 MG TOTAL) DAILY WITH BREAKFAST. 90 tablet 1   gabapentin (NEURONTIN) 100 MG capsule Take by mouth.     hydrOXYzine (ATARAX) 10 MG tablet Take 1 tablet (10 mg total) by mouth 3 (three) times daily as needed. 90 tablet 1   Lancet Devices (TRUEDRAW LANCING DEVICE) MISC Check sugars daily 90 each 0   linaclotide (LINZESS) 72 MCG capsule Take by mouth.     lisinopril-hydrochlorothiazide (ZESTORETIC) 20-12.5 MG tablet TAKE 1 TABLET EVERY DAY 90 tablet 1   metFORMIN (GLUCOPHAGE-XR) 500 MG 24  hr tablet TAKE 1 TABLET EVERY DAY 90 tablet 0   Multiple Vitamin (MULTIVITAMIN WITH MINERALS) TABS tablet Take 1 tablet by mouth daily after breakfast.      predniSONE (DELTASONE) 20 MG tablet 2 tablets daily x 5 days 10 tablet 0   simvastatin (ZOCOR) 40 MG tablet TAKE 1 TABLET EVERY DAY 90 tablet 1   solifenacin (VESICARE) 5 MG tablet TAKE 1 TABLET EVERY DAY 90 tablet 1   TRUE METRIX BLOOD GLUCOSE TEST test strip SMARTSIG:Via Meter     TRUEplus Lancets 33G MISC 1 each by Does not apply route 2 (two) times daily. 200 each 4   No current facility-administered medications for this visit.   Facility-Administered Medications Ordered in Other Visits  Medication Dose Route Frequency Provider Last Rate Last Admin   sodium chloride flush (NS) 0.9 % injection 10 mL  10 mL Intracatheter PRN Curt Bears, MD   10 mL at 09/04/19 1556    SURGICAL HISTORY:  Past Surgical History:  Procedure Laterality Date   BRONCHIAL BIOPSY  08/20/2019   Procedure: BRONCHIAL BIOPSIES;  Surgeon: Collene Gobble, MD;  Location: [facility] ENDOSCOPY;  Service: Pulmonary;;   BRONCHIAL BRUSHINGS  08/20/2019   Procedure: BRONCHIAL BRUSHINGS;  Surgeon: Collene Gobble, MD;  Location[facility]OSCOPY;  Service: Pulmonary;;   BRONCHIAL NEEDLE ASPIRATION BIOPSY  08/20/2019   Procedure: BRONCHIAL NEEDLE ASPIRATION BIOPSIES;  Surgeon: Collene Gobble, MD;  Location: [facility];  Service: Pulmonary;;   CARPAL TUNNEL RELEASE Right 01/09/2019   Procedure: CARPAL TUNNEL RELEASE;  Surgeon: Leandrew Koyanagi, MD;  Location: [facility];  Service: Orthopedics;  Laterality: Right;   CARPAL TUNNEL WITH CUBITAL TUNNEL Right 11/18/2020   Procedure: CARPAL TUNNEL WITH CUBITAL TUNNEL;  Surgeon: Charlotte Crumb, MD;  Location: [facility];  Service: Orthopedics;  Laterality: Right;   CATARACT EXTRACTION Right 2018   COLONOSCOPY  2007   Dr. Benson [place]   DIRECT LARYNGOSCOPY N/A 04/10/2020   Procedure: DIRECT LARYNGOSCOPY;  Surgeon: Melida Quitter, MD;   Location: [facility];  Service: ENT;  Laterality: N/A;   I & D EXTREMITY Right 04/02/2016   Procedure: IRRIGATION AND DEBRIDEMENT GREAT TOE;  Surgeon: Leandrew Koyanagi, MD;  Location: [facility];  Service: Orthopedics;  Laterality: Right;   IR IMAGING GUIDED PORT INSERTION  08/30/2019   MULTIPLE TOOTH EXTRACTIONS     RADIOACTIVE SEED IMPLANT  2003   TRACHEOSTOMY TUBE PLACEMENT N/A 04/10/2020   Procedure: TRACHEOSTOMY;  Surgeon: Melida Quitter, MD;  Location: [facility];  Service: ENT;  Laterality: N/A;   ULNAR TUNNEL RELEASE Right 01/09/2019   Procedure: RIGHT CUBITAL TUNNEL RELEASE AND CARPAL TUNNEL RELEASE;  Surgeon: Leandrew Koyanagi, MD;  Location: [facility];  Service: Orthopedics;  Laterality: Right;   UPPER GASTROINTESTINAL ENDOSCOPY     VIDEO BRONCHOSCOPY N/A 12/18/2019   Procedure: VIDEO BRONCHOSCOPY WITHOUT FLUORO;  Surgeon: Collene Gobble, MD;  Location: [facility] ENDOSCOPY;  Service: Cardiopulmonary;  Laterality: N/A;   VIDEO BRONCHOSCOPY WITH ENDOBRONCHIAL ULTRASOUND N/A 08/20/2019   Procedure: VIDEO BRONCHOSCOPY WITH ENDOBRONCHIAL ULTRASOUND;  Surgeon: Collene Gobble, MD;  Location: [facility] ENDOSCOPY;  Service: Pulmonary;  Laterality: N/A;    REVIEW OF SYSTEMS:   Review of Systems  Constitutional: Negative for appetite change, chills, fatigue, fever and unexpected weight change.  HENT:   Negative for mouth sores, nosebleeds, sore throat and trouble swallowing.   Eyes: Negative for eye problems and icterus.  Respiratory: Negative for cough, hemoptysis, shortness of breath and wheezing.   Cardiovascular: Negative for chest pain and leg swelling.  Gastrointestinal: Negative for abdominal pain, constipation, diarrhea, nausea and vomiting.  Genitourinary: Negative for bladder incontinence, difficulty urinating, dysuria, frequency and hematuria.   Musculoskeletal: Negative for back pain, gait problem, neck pain and neck stiffness.  Skin: Negative for itching and rash.  Neurological: Negative for  dizziness, extremity weakness, gait problem, headaches, light-headedness and seizures.  Hematological: Negative for adenopathy. Does not bruise/bleed easily.  Psychiatric/Behavioral: Negative for confusion, depression and sleep disturbance. The patient is not nervous/anxious.     PHYSICAL EXAMINATION:  There were no vitals taken for this visit.  ECOG PERFORMANCE STATUS: {CHL ONC ECOG [id number]  Physical Exam  Constitutional: Oriented to person, place, and time and well-developed, well-nourished, and in no distress. No distress.  HENT:  Head: Normocephalic and atraumatic.  Mouth/Throat: Oropharynx is clear and moist. No oropharyngeal exudate.  Eyes: Conjunctivae are normal. Right eye exhibits no discharge. Left eye exhibits no discharge. No scleral icterus.  Neck: Normal range of motion. Neck supple.  Cardiovascular: Normal rate, regular rhythm, normal heart sounds and intact distal pulses.   Pulmonary/Chest: Effort normal and breath sounds normal. No respiratory distress. No wheezes. No rales.  Abdominal: Soft. Bowel sounds are normal. Exhibits no distension and no mass. There is no tenderness.  Musculoskeletal: Normal range of motion. Exhibits no edema.  Lymphadenopathy:    No cervical adenopathy.  Neurological: Alert and oriented to person, place, and time. Exhibits normal muscle tone. Gait normal. Coordination normal.  Skin: Skin is warm and dry. No rash noted. Not diaphoretic. No erythema. No pallor.  Psychiatric: Mood, memory and judgment normal.  Vitals reviewed.  LABORATORY DATA: Lab Results  Component Value Date   WBC 6.0 09/01/2021   HGB 10.3 (L) 09/01/2021   HCT 30.4 (L) 09/01/2021   MCV 93.0 09/01/2021   PLT 288 09/01/2021      Chemistry      Component Value Date/Time   NA 138 09/01/2021 1046   NA 132 (L) 08/14/2019 0918   K 3.9 09/01/2021 1046   CL 105 09/01/2021 1046   CO2 29 09/01/2021 1046   BUN 16 09/01/2021 1046   BUN 16 08/14/2019 0918    CREATININE 1.24 09/01/2021 1046   CREATININE 1.25 (H) 08/08/2016 0912      Component Value Date/Time   CALCIUM 8.9 09/01/2021 1046   ALKPHOS 58 09/01/2021 1046   AST 19 09/01/2021 1046   ALT 12 09/01/2021 1046   BILITOT 0.9 09/01/2021 1046       RADIOGRAPHIC STUDIES:  CT Chest W Contrast  Result Date: 08/31/2021 CLINICAL DATA:  Non-small cell lung cancer. Assess treatment response. * Tracking Code: BO * EXAM: CT CHEST, ABDOMEN, AND PELVIS WITH CONTRAST TECHNIQUE: Multidetector  CT imaging of the chest, abdomen and pelvis was performed following the standard protocol during bolus administration of intravenous contrast. RADIATION DOSE REDUCTION: This exam was performed according to the departmental dose-optimization program which includes automated exposure control, adjustment of the mA and/or kV according to patient size and/or use of iterative reconstruction technique. CONTRAST:  165m OMNIPAQUE IOHEXOL 300 MG/ML  SOLN COMPARISON:  Prior CTs 06/04/2021 and 03/02/2021. FINDINGS: CT CHEST FINDINGS Cardiovascular: Suboptimal contrast bolus. Al allowing for this, no acute vascular findings are identified. Right IJ Port-A-Cath extends to the lower SVC level. Extensive coronary artery atherosclerosis with lesser involvement of the aorta and great vessels. The heart size is stable. No significant pericardial effusion. Mediastinum/Nodes: There are no enlarged mediastinal, hilar or axillary lymph nodes. Tracheostomy remains in place. Possible mild chronic wall thickening of the distal esophagus. Lungs/Pleura: Chronic pleural thickening posteriorly on the left. No significant pleural effusion or pneumothorax. There are chronic radiation changes in the left perihilar lung with volume loss, architectural distortion and para mediastinal fibrosis. A previously demonstrated subpleural nodule in the left lower lobe is partly obscured by breathing artifact today, although appears similar, measuring 8 mm on image  93/7. No new or enlarging pulmonary nodules. Mild centrilobular and paraseptal emphysema. Musculoskeletal/Chest wall: No chest wall mass or suspicious osseous findings. Scattered ill-defined lucencies within the thoracic spine are stable, considered benign based on stability. CT ABDOMEN AND PELVIS FINDINGS Hepatobiliary: Hepatic assessment limited by suboptimal contrast bolus. The liver appears grossly unchanged, without focal abnormality. The gallbladder is incompletely distended. No evidence of gallstones, gallbladder wall thickening or biliary dilatation. Pancreas: Unremarkable. No pancreatic ductal dilatation or surrounding inflammatory changes. Spleen: Normal in size without focal abnormality. Adrenals/Urinary Tract: Both adrenal glands appear normal. The kidneys appear stable without urinary tract calculus, hydronephrosis or suspicious lesion. There is a stable small parapelvic cyst in the upper pole of the right kidney for which no follow-up imaging is recommended. Mild bladder wall thickening attributed to incomplete distension. No focal bladder abnormality identified. Stomach/Bowel: Enteric contrast was administered and has passed into the distal small bowel. The stomach appears unremarkable for its degree of distension. No evidence of bowel wall thickening, distention or surrounding inflammatory change. Diverticular changes throughout the descending and sigmoid colon. The appendix appears normal. Vascular/Lymphatic: There are no enlarged abdominal or pelvic lymph nodes. Aortic and branch vessel atherosclerosis without evidence of aneurysm. Reproductive: Prostate brachytherapy seeds. Other: Interval near complete resolution of previously demonstrated complex fluid collection within a right inguinal hernia. There is mild asymmetric soft tissue thickening in this area. No herniated bowel. Small umbilical hernia containing only fat. No ascites, free air or peritoneal nodularity. Musculoskeletal: No acute or  significant osseous findings. Stable multilevel endplate degenerative changes throughout the lumbar spine. IMPRESSION: 1. Stable treatment changes in the left hemithorax. 2. No evidence of local recurrence or metastatic disease. Small pulmonary nodules are grossly unchanged, considered benign based on stability. 3. Resolving complex fluid collection within a right inguinal hernia. 4. Distal colonic diverticulosis. 5. Coronary and aortic atherosclerosis (ICD10-I70.0). Emphysema (ICD10-J43.9). Electronically Signed   By: WRichardean SaleM.D.   On: 08/31/2021 19:57   CT Abdomen Pelvis W Contrast  Result Date: 08/31/2021 CLINICAL DATA:  Non-small cell lung cancer. Assess treatment response. * Tracking Code: BO * EXAM: CT CHEST, ABDOMEN, AND PELVIS WITH CONTRAST TECHNIQUE: Multidetector CT imaging of the chest, abdomen and pelvis was performed following the standard protocol during bolus administration of intravenous contrast. RADIATION DOSE REDUCTION: This exam was performed  according to the departmental dose-optimization program which includes automated exposure control, adjustment of the mA and/or kV according to patient size and/or use of iterative reconstruction technique. CONTRAST:  168m OMNIPAQUE IOHEXOL 300 MG/ML  SOLN COMPARISON:  Prior CTs 06/04/2021 and 03/02/2021. FINDINGS: CT CHEST FINDINGS Cardiovascular: Suboptimal contrast bolus. Al allowing for this, no acute vascular findings are identified. Right IJ Port-A-Cath extends to the lower SVC level. Extensive coronary artery atherosclerosis with lesser involvement of the aorta and great vessels. The heart size is stable. No significant pericardial effusion. Mediastinum/Nodes: There are no enlarged mediastinal, hilar or axillary lymph nodes. Tracheostomy remains in place. Possible mild chronic wall thickening of the distal esophagus. Lungs/Pleura: Chronic pleural thickening posteriorly on the left. No significant pleural effusion or pneumothorax. There  are chronic radiation changes in the left perihilar lung with volume loss, architectural distortion and para mediastinal fibrosis. A previously demonstrated subpleural nodule in the left lower lobe is partly obscured by breathing artifact today, although appears similar, measuring 8 mm on image 93/7. No new or enlarging pulmonary nodules. Mild centrilobular and paraseptal emphysema. Musculoskeletal/Chest wall: No chest wall mass or suspicious osseous findings. Scattered ill-defined lucencies within the thoracic spine are stable, considered benign based on stability. CT ABDOMEN AND PELVIS FINDINGS Hepatobiliary: Hepatic assessment limited by suboptimal contrast bolus. The liver appears grossly unchanged, without focal abnormality. The gallbladder is incompletely distended. No evidence of gallstones, gallbladder wall thickening or biliary dilatation. Pancreas: Unremarkable. No pancreatic ductal dilatation or surrounding inflammatory changes. Spleen: Normal in size without focal abnormality. Adrenals/Urinary Tract: Both adrenal glands appear normal. The kidneys appear stable without urinary tract calculus, hydronephrosis or suspicious lesion. There is a stable small parapelvic cyst in the upper pole of the right kidney for which no follow-up imaging is recommended. Mild bladder wall thickening attributed to incomplete distension. No focal bladder abnormality identified. Stomach/Bowel: Enteric contrast was administered and has passed into the distal small bowel. The stomach appears unremarkable for its degree of distension. No evidence of bowel wall thickening, distention or surrounding inflammatory change. Diverticular changes throughout the descending and sigmoid colon. The appendix appears normal. Vascular/Lymphatic: There are no enlarged abdominal or pelvic lymph nodes. Aortic and branch vessel atherosclerosis without evidence of aneurysm. Reproductive: Prostate brachytherapy seeds. Other: Interval near complete  resolution of previously demonstrated complex fluid collection within a right inguinal hernia. There is mild asymmetric soft tissue thickening in this area. No herniated bowel. Small umbilical hernia containing only fat. No ascites, free air or peritoneal nodularity. Musculoskeletal: No acute or significant osseous findings. Stable multilevel endplate degenerative changes throughout the lumbar spine. IMPRESSION: 1. Stable treatment changes in the left hemithorax. 2. No evidence of local recurrence or metastatic disease. Small pulmonary nodules are grossly unchanged, considered benign based on stability. 3. Resolving complex fluid collection within a right inguinal hernia. 4. Distal colonic diverticulosis. 5. Coronary and aortic atherosclerosis (ICD10-I70.0). Emphysema (ICD10-J43.9). Electronically Signed   By: WRichardean SaleM.D.   On: 08/31/2021 19:57     ASSESSMENT/PLAN:  This is a very pleasant 81year old [name] diagnosed with stage IV (T3, N2, M1a) non-small cell lung cancer, squamous cell carcinoma.  He presented with a left lower lobe obstructive lung mass in addition to mediastinal lymphadenopathy.  He also presented with bilateral pulmonary nodules.  He was diagnosed in July 2021.  His PD-L1 expression is negative.   He completed palliative radiotherapy to the obstructive lung mass under the care of Dr. MLisbeth Renshawin August 2021.   The patient is  currently undergoing systemic chemotherapy with carboplatin and paclitaxel for 2 cycles in addition to immunotherapy with ipilimumab 1 mg/KG every 6 weeks and nivolumab 360 mg IV every 3 weeks.  He is status post 18 cycles.    Labs were reviewed.  Recommend that he proceed with cycle 18 day 22 today scheduled.   We will see him back for follow-up visit in 3 weeks for evaluation before starting day 1 cycle 19.   He will continue to follow with ENT for his tracheostomy.    The patient was advised to call immediately if he has any concerning  symptoms in the interval. The patient voices understanding of current disease status and treatment options and is in agreement with the current care plan. All questions were answered. The patient knows to call the clinic with any problems, questions or concerns. We can certainly see the patient much sooner if necessary      No orders of the defined types were placed in this encounter.    I spent {CHL ONC TIME VISIT - FSFSE:[id number]} counseling the patient face to face. The total time spent in the appointment was {CHL ONC TIME VISIT - DHWYS:[id number]}.  Charlsie Fleeger L Kerin Cecchi, PA-C 09/17/21

## 2021-09-17 NOTE — Patient Outreach (Signed)
[facility]) Care Management  09/17/2021  TANDRE CONLY [birth year] [phone]   Telephone Assessment    Unsuccessful outreach attempt to patient. RN CM left HIPAA compliant voicemail message along with contact info.    Plan: RN CM will make outreach attempt within the 3wks if no return call.    [name], RN,BSN,CCM [facility] Management Telephonic Care Management Coordinator Direct Phone: [phone] Toll Free: [phone] Fax: [phone]

## 2021-09-22 ENCOUNTER — Inpatient Hospital Stay: Payer: Medicare HMO

## 2021-09-22 ENCOUNTER — Inpatient Hospital Stay: Payer: Medicare HMO | Admitting: Physician Assistant

## 2021-09-22 ENCOUNTER — Telehealth: Payer: Self-pay

## 2021-09-22 NOTE — Telephone Encounter (Signed)
Shaun Kennedy had PF w/labs, OV, and tx scheduled for today. It is unlike the Shaun Kennedy not to present for his appts.  I have called the Shaun Kennedy cell number and LVM. I attempted his home number as well as his wife's cell number but there was no answer and I did not leave a message there.  I have also called Shaun Kennedy son, Damon, and spoke with him to see if Shaun Kennedy was okay. Damon was quite surprised the Shaun Kennedy was not here at the [place] and states he visited with his father earlier in the day and his father was aware he had an appt today at 2p because he mentioned it to him. He agreed he would call us back with any updates.

## 2021-09-23 ENCOUNTER — Inpatient Hospital Stay (HOSPITAL_BASED_OUTPATIENT_CLINIC_OR_DEPARTMENT_OTHER): Payer: Medicare HMO | Admitting: Physician Assistant

## 2021-09-23 ENCOUNTER — Inpatient Hospital Stay: Payer: Medicare HMO | Attending: Internal Medicine

## 2021-09-23 ENCOUNTER — Other Ambulatory Visit: Payer: Self-pay

## 2021-09-23 ENCOUNTER — Other Ambulatory Visit: Payer: Self-pay | Admitting: Internal Medicine

## 2021-09-23 VITALS — BP 140/88 | HR 70 | Temp 97.8°F | Resp 14 | Ht 67.0 in | Wt 183.8 lb

## 2021-09-23 DIAGNOSIS — Z95828 Presence of other vascular implants and grafts: Secondary | ICD-10-CM

## 2021-09-23 DIAGNOSIS — C3432 Malignant neoplasm of lower lobe, left bronchus or lung: Secondary | ICD-10-CM

## 2021-09-23 DIAGNOSIS — Z7952 Long term (current) use of systemic steroids: Secondary | ICD-10-CM | POA: Diagnosis not present

## 2021-09-23 DIAGNOSIS — Z87891 Personal history of nicotine dependence: Secondary | ICD-10-CM | POA: Diagnosis not present

## 2021-09-23 DIAGNOSIS — Z923 Personal history of irradiation: Secondary | ICD-10-CM | POA: Insufficient documentation

## 2021-09-23 DIAGNOSIS — Z5112 Encounter for antineoplastic immunotherapy: Secondary | ICD-10-CM | POA: Diagnosis not present

## 2021-09-23 DIAGNOSIS — Z7984 Long term (current) use of oral hypoglycemic drugs: Secondary | ICD-10-CM | POA: Diagnosis not present

## 2021-09-23 DIAGNOSIS — Z79899 Other long term (current) drug therapy: Secondary | ICD-10-CM | POA: Diagnosis not present

## 2021-09-23 DIAGNOSIS — Z7982 Long term (current) use of aspirin: Secondary | ICD-10-CM | POA: Insufficient documentation

## 2021-09-23 LAB — CMP (CANCER CENTER ONLY)
ALT: 14 U/L (ref 0–44)
AST: 18 U/L (ref 15–41)
Albumin: 3.7 g/dL (ref 3.5–5.0)
Alkaline Phosphatase: 60 U/L (ref 38–126)
Anion gap: 4 — ABNORMAL LOW (ref 5–15)
BUN: 20 mg/dL (ref 8–23)
CO2: 27 mmol/L (ref 22–32)
Calcium: 9 mg/dL (ref 8.9–10.3)
Chloride: 107 mmol/L (ref 98–111)
Creatinine: 1.1 mg/dL (ref 0.61–1.24)
GFR, Estimated: >60 mL/min (ref >60)
Glucose, Bld: 98 mg/dL (ref 70–99)
Potassium: 4 mmol/L (ref 3.5–5.1)
Sodium: 138 mmol/L (ref 135–145)
Total Bilirubin: 0.8 mg/dL (ref 0.3–1.2)
Total Protein: 8 g/dL (ref 6.5–8.1)

## 2021-09-23 LAB — CBC WITH DIFFERENTIAL (CANCER CENTER ONLY)
Abs Immature Granulocytes: 0.01 10*3/uL (ref 0.00–0.07)
Basophils Absolute: 0 10*3/uL (ref 0.0–0.1)
Basophils Relative: 0 %
Eosinophils Absolute: 0.9 10*3/uL — ABNORMAL HIGH (ref 0.0–0.5)
Eosinophils Relative: 14 %
HCT: 31.8 % — ABNORMAL LOW (ref 39.0–52.0)
Hemoglobin: 10.7 g/dL — ABNORMAL LOW (ref 13.0–17.0)
Immature Granulocytes: 0 %
Lymphocytes Relative: 19 %
Lymphs Abs: 1.3 10*3/uL (ref 0.7–4.0)
MCH: 31.7 pg (ref 26.0–34.0)
MCHC: 33.6 g/dL (ref 30.0–36.0)
MCV: 94.1 fL (ref 80.0–100.0)
Monocytes Absolute: 0.7 10*3/uL (ref 0.1–1.0)
Monocytes Relative: 11 %
Neutro Abs: 3.8 10*3/uL (ref 1.7–7.7)
Neutrophils Relative %: 56 %
Platelet Count: 270 10*3/uL (ref 150–400)
RBC: 3.38 MIL/uL — ABNORMAL LOW (ref 4.22–5.81)
RDW: 13 % (ref 11.5–15.5)
WBC Count: 6.8 10*3/uL (ref 4.0–10.5)
nRBC: 0 % (ref 0.0–0.2)

## 2021-09-23 LAB — TSH: TSH: 1.448 u[IU]/mL (ref 0.350–4.500)

## 2021-09-23 MED ORDER — HEPARIN SOD (PORK) LOCK FLUSH 100 UNIT/ML IV SOLN
500.0000 [IU] | Freq: Once | INTRAVENOUS | Status: AC
  Administered 2021-09-23: 500 [IU]

## 2021-09-23 MED ORDER — SODIUM CHLORIDE 0.9% FLUSH
10.0000 mL | Freq: Once | INTRAVENOUS | Status: AC
  Administered 2021-09-23: 10 mL

## 2021-09-23 NOTE — Progress Notes (Signed)
[facility] OFFICE PROGRESS NOTE  Shaun Kennedy, [place] [address]  DIAGNOSIS: Stage IV (T3, N2, M1 a) non-small cell Kennedy cancer, squamous cell carcinoma presented with obstructive left lower lobe Kennedy mass in addition to mediastinal lymphadenopathy as well as bilateral pulmonary nodules diagnosed in July 2021.   PDL1: 0%  PRIOR THERAPY: Palliative radiotherapy to the obstructive left lower lobe Kennedy mass under the care of Dr. Lisbeth Kennedy. Completed on 09/20/19  CURRENT THERAPY: Systemic chemotherapy 2 cycles of chemotherapy with carboplatin for an AUC of 5 and paclitaxel 175 mg/m2 in addition to immunotherapy with nivolumab 360 mg every 3 weeks and ipilimumab 1 mg/kg IV every 6 weeks followed by maintenance nivolumab and ipilimumab.  He started the first treatment on 09/04/2019.  He is status post 18 cycles of treatment.     INTERVAL HISTORY: Shaun Kennedy 81 y.o. male returns to the clinic today for a follow up visit. The patient is feeling well today without any concerning complaints. He follows closely with ENT for his tracheostomy.  The patient continues to tolerate treatment with immunotherapy well without any adverse side effects. He is feeling well today. He is planning on golfing tomorrow morning. Denies any fever, chills, night sweats, or weight loss. Denies any chest pain or hemoptysis. He sometimes has a cough related to mucus production getting caught in his tracheostomy. He has been taking mucinex. He denies any shortness of breath unless with certain strenuous activities he may get winded.  Denies any nausea, vomiting, constipation, or diarrhea. Denies any headache or visual changes. Denies any rashes or skin changes. The patient is here today for evaluation prior to starting cycle #18 day 22.   MEDICAL HISTORY: Past Medical History:  Diagnosis Date   Allergic rhinitis    Asthma    Carotid stenosis    Colon cancer ([place]) 2003   Diabetes  mellitus ([place])    Diverticulosis    Dyslipidemia    ED (erectile dysfunction)    GERD (gastroesophageal reflux disease)    H/O degenerative disc disease    Hemorrhoids    HTN (hypertension)    as a child   Hyperlipidemia    Kennedy cancer ([place])    LVH (left ventricular hypertrophy)    on EKG   Mass of lower lobe of left Kennedy    Mediastinal adenopathy    Smoker    former   Wears dentures    Wears dentures    Wears glasses    Wears glasses     ALLERGIES:  has No Known Allergies.  MEDICATIONS:  Current Outpatient Medications  Medication Sig Dispense Refill   Alcohol Swabs (DROPSAFE ALCOHOL PREP) 70 % PADS Apply topically.     amLODipine (NORVASC) 5 MG tablet TAKE 1 TABLET EVERY DAY 90 tablet 3   aspirin EC 81 MG tablet Take 81 mg by mouth daily after breakfast.      betamethasone valerate ointment (VALISONE) 0.1 % Apply 1 Application topically 2 (two) times daily. 45 g 0   bisacodyl (DULCOLAX) 5 MG EC tablet Take 10 mg by mouth daily as needed for moderate constipation.     Blood Glucose Calibration (TRUE METRIX LEVEL 1) Low SOLN      cholecalciferol (VITAMIN D) 25 MCG (1000 UNIT) tablet Take 1,000 Units by mouth daily.     donepezil (ARICEPT) 5 MG tablet Take 1 tablet (5 mg total) by mouth at bedtime. 90 tablet 0   dronabinol (MARINOL) 2.5  MG capsule Take 1 capsule (2.5 mg total) by mouth 2 (two) times daily before a meal. 60 capsule 1   ferrous sulfate 325 (65 FE) MG EC tablet TAKE 1 TABLET (325 MG TOTAL) DAILY WITH BREAKFAST. 90 tablet 1   gabapentin (NEURONTIN) 100 MG capsule Take by mouth.     hydrOXYzine (ATARAX) 10 MG tablet Take 1 tablet (10 mg total) by mouth 3 (three) times daily as needed. 90 tablet 1   Lancet Devices (TRUEDRAW LANCING DEVICE) MISC Check sugars daily 90 each 0   linaclotide (LINZESS) 72 MCG capsule Take by mouth.     lisinopril-hydrochlorothiazide (ZESTORETIC) 20-12.5 MG tablet TAKE 1 TABLET EVERY DAY 90 tablet 1   metFORMIN (GLUCOPHAGE-XR) 500 MG 24  hr tablet TAKE 1 TABLET EVERY DAY 90 tablet 0   Multiple Vitamin (MULTIVITAMIN WITH MINERALS) TABS tablet Take 1 tablet by mouth daily after breakfast.      predniSONE (DELTASONE) 20 MG tablet 2 tablets daily x 5 days 10 tablet 0   simvastatin (ZOCOR) 40 MG tablet TAKE 1 TABLET EVERY DAY 90 tablet 1   solifenacin (VESICARE) 5 MG tablet TAKE 1 TABLET EVERY DAY 90 tablet 1   TRUE METRIX BLOOD GLUCOSE TEST test strip SMARTSIG:Via Meter     TRUEplus Lancets 33G MISC 1 each by Does not apply route 2 (two) times daily. 200 each 4   No current facility-administered medications for this visit.    SURGICAL HISTORY:  Past Surgical History:  Procedure Laterality Date   BRONCHIAL BIOPSY  08/20/2019   Procedure: BRONCHIAL BIOPSIES;  Surgeon: Collene Gobble, MD;  Location: [facility] ENDOSCOPY;  Service: Pulmonary;;   BRONCHIAL BRUSHINGS  08/20/2019   Procedure: BRONCHIAL BRUSHINGS;  Surgeon: Collene Gobble, MD;  Location[facility];  Service: Pulmonary;;   BRONCHIAL NEEDLE ASPIRATION BIOPSY  08/20/2019   Procedure: BRONCHIAL NEEDLE ASPIRATION BIOPSIES;  Surgeon: Collene Gobble, MD;  Location[facility] ENDOSCOPY;  Service: Pulmonary;;   CARPAL TUNNEL RELEASE Right 01/09/2019   Procedure: CARPAL TUNNEL RELEASE;  Surgeon: Leandrew Koyanagi, MD;  Location: [facility];  Service: Orthopedics;  Laterality: Right;   CARPAL TUNNEL WITH CUBITAL TUNNEL Right 11/18/2020   Procedure: CARPAL TUNNEL WITH CUBITAL TUNNEL;  Surgeon: Charlotte Crumb, MD;  Location: [facility];  Service: Orthopedics;  Laterality: Right;   CATARACT EXTRACTION Right 2018   COLONOSCOPY  2007   Dr. Benson Norway   DIRECT LARYNGOSCOPY N/A 04/10/2020   Procedure: DIRECT LARYNGOSCOPY;  Surgeon: Melida Quitter, MD;  Location: [facility];  Service: ENT;  Laterality: N/A;   I & D EXTREMITY Right 04/02/2016   Procedure: IRRIGATION AND DEBRIDEMENT GREAT TOE;  Surgeon: Leandrew Koyanagi, MD;  Location: [facility];  Service: Orthopedics;  Laterality: Right;   IR IMAGING GUIDED  PORT INSERTION  08/30/2019   MULTIPLE TOOTH EXTRACTIONS     RADIOACTIVE SEED IMPLANT  2003   TRACHEOSTOMY TUBE PLACEMENT N/A 04/10/2020   Procedure: TRACHEOSTOMY;  Surgeon: Melida Quitter, MD;  Location: [facility];  Service: ENT;  Laterality: N/A;   ULNAR TUNNEL RELEASE Right 01/09/2019   Procedure: RIGHT CUBITAL TUNNEL RELEASE AND CARPAL TUNNEL RELEASE;  Surgeon: Leandrew Koyanagi, MD;  Location: [facility];  Service: Orthopedics;  Laterality: Right;   UPPER GASTROINTESTINAL ENDOSCOPY     VIDEO BRONCHOSCOPY N/A 12/18/2019   Procedure: VIDEO BRONCHOSCOPY WITHOUT FLUORO;  Surgeon: Collene Gobble, MD;  Location: [facility] ENDOSCOPY;  Service: Cardiopulmonary;  Laterality: N/A;   VIDEO BRONCHOSCOPY WITH ENDOBRONCHIAL ULTRASOUND N/A  08/20/2019   Procedure: VIDEO BRONCHOSCOPY WITH ENDOBRONCHIAL ULTRASOUND;  Surgeon: Collene Gobble, MD;  Location: [facility] ENDOSCOPY;  Service: Pulmonary;  Laterality: N/A;    REVIEW OF SYSTEMS:   Review of Systems  Constitutional: Negative for appetite change, chills, fatigue, fever and unexpected weight change.  HENT: Negative for mouth sores, nosebleeds, sore throat and trouble swallowing.   Eyes: Negative for eye problems and icterus.  Respiratory: Positive for occasional cough depending on mucus blocking his trach. Positive for mild dyspnea with certain activities. Negative for hemoptysis and wheezing.   Cardiovascular: Negative for chest pain and leg swelling.  Gastrointestinal: Negative for abdominal pain, constipation, diarrhea, nausea and vomiting.  Genitourinary: Negative for bladder incontinence, difficulty urinating, dysuria, frequency and hematuria.   Musculoskeletal: Negative for back pain, gait problem, neck pain and neck stiffness.  Skin: Negative for itching and rash.  Neurological: Negative for dizziness, extremity weakness, gait problem, headaches, light-headedness and seizures.  Hematological: Negative for adenopathy. Does not bruise/bleed easily.   Psychiatric/Behavioral: Negative for confusion, depression and sleep disturbance. The patient is not nervous/anxious.     PHYSICAL EXAMINATION:  Blood pressure (!) 140/88, pulse 70, temperature 97.8 F (36.6 C), temperature source Oral, resp. rate 14, height '5\' 7"'  (1.702 m), weight 183 lb 12.8 oz (83.4 kg), SpO2 100 %.  ECOG PERFORMANCE STATUS: 1  Physical Exam  Constitutional: Oriented to person, place, and time and well-developed, well-nourished, and in no distress.  Head: Normocephalic and atraumatic. Mouth/Throat: Oropharynx is clear and moist. No oropharyngeal exudate. Tracheostomy in place.  Eyes: Conjunctivae are normal. Right eye exhibits no discharge. Left eye exhibits no discharge. No scleral icterus. Neck: Normal range of motion. Neck supple. Cardiovascular: Normal rate, regular rhythm, normal heart sounds and intact distal pulses.   Pulmonary/Chest: Effort normal. Rhonchi present bilaterally which cleared with coughing. No respiratory distress. No rales. Abdominal: Soft. Bowel sounds are normal. Exhibits no distension and no mass. There is no tenderness.  Musculoskeletal: Normal range of motion. Exhibits no edema.  Lymphadenopathy:    No cervical adenopathy.  Neurological: Alert and oriented to person, place, and time. Exhibits normal muscle tone. Gait normal. Coordination normal. Skin: Skin is warm and dry. No rash noted. Not diaphoretic. No erythema. No pallor.  Psychiatric: Mood, memory and judgment normal. Vitals reviewed  LABORATORY DATA: Lab Results  Component Value Date   WBC 6.8 09/23/2021   HGB 10.7 (L) 09/23/2021   HCT 31.8 (L) 09/23/2021   MCV 94.1 09/23/2021   PLT 270 09/23/2021      Chemistry      Component Value Date/Time   NA 138 09/01/2021 1046   NA 132 (L) 08/14/2019 0918   K 3.9 09/01/2021 1046   CL 105 09/01/2021 1046   CO2 29 09/01/2021 1046   BUN 16 09/01/2021 1046   BUN 16 08/14/2019 0918   CREATININE 1.24 09/01/2021 1046    CREATININE 1.25 (H) 08/08/2016 0912      Component Value Date/Time   CALCIUM 8.9 09/01/2021 1046   ALKPHOS 58 09/01/2021 1046   AST 19 09/01/2021 1046   ALT 12 09/01/2021 1046   BILITOT 0.9 09/01/2021 1046       RADIOGRAPHIC STUDIES:  CT Chest W Contrast  Result Date: 08/31/2021 CLINICAL DATA:  Non-small cell Kennedy cancer. Assess treatment response. * Tracking Code: BO * EXAM: CT CHEST, ABDOMEN, AND PELVIS WITH CONTRAST TECHNIQUE: Multidetector CT imaging of the chest, abdomen and pelvis was performed following the standard protocol during bolus administration of intravenous contrast.  RADIATION DOSE REDUCTION: This exam was performed according to the departmental dose-optimization program which includes automated exposure control, adjustment of the mA and/or kV according to patient size and/or use of iterative reconstruction technique. CONTRAST:  147m OMNIPAQUE IOHEXOL 300 MG/ML  SOLN COMPARISON:  Prior CTs 06/04/2021 and 03/02/2021. FINDINGS: CT CHEST FINDINGS Cardiovascular: Suboptimal contrast bolus. Al allowing for this, no acute vascular findings are identified. Right IJ Port-A-Cath extends to the lower SVC level. Extensive coronary artery atherosclerosis with lesser involvement of the aorta and great vessels. The heart size is stable. No significant pericardial effusion. Mediastinum/Nodes: There are no enlarged mediastinal, hilar or axillary lymph nodes. Tracheostomy remains in place. Possible mild chronic wall thickening of the distal esophagus. Lungs/Pleura: Chronic pleural thickening posteriorly on the left. No significant pleural effusion or pneumothorax. There are chronic radiation changes in the left perihilar Kennedy with volume loss, architectural distortion and para mediastinal fibrosis. A previously demonstrated subpleural nodule in the left lower lobe is partly obscured by breathing artifact today, although appears similar, measuring 8 mm on image 93/7. No new or enlarging pulmonary  nodules. Mild centrilobular and paraseptal emphysema. Musculoskeletal/Chest wall: No chest wall mass or suspicious osseous findings. Scattered ill-defined lucencies within the thoracic spine are stable, considered benign based on stability. CT ABDOMEN AND PELVIS FINDINGS Hepatobiliary: Hepatic assessment limited by suboptimal contrast bolus. The liver appears grossly unchanged, without focal abnormality. The gallbladder is incompletely distended. No evidence of gallstones, gallbladder wall thickening or biliary dilatation. Pancreas: Unremarkable. No pancreatic ductal dilatation or surrounding inflammatory changes. Spleen: Normal in size without focal abnormality. Adrenals/Urinary Tract: Both adrenal glands appear normal. The kidneys appear stable without urinary tract calculus, hydronephrosis or suspicious lesion. There is a stable small parapelvic cyst in the upper pole of the right kidney for which no follow-up imaging is recommended. Mild bladder wall thickening attributed to incomplete distension. No focal bladder abnormality identified. Stomach/Bowel: Enteric contrast was administered and has passed into the distal small bowel. The stomach appears unremarkable for its degree of distension. No evidence of bowel wall thickening, distention or surrounding inflammatory change. Diverticular changes throughout the descending and sigmoid colon. The appendix appears normal. Vascular/Lymphatic: There are no enlarged abdominal or pelvic lymph nodes. Aortic and branch vessel atherosclerosis without evidence of aneurysm. Reproductive: Prostate brachytherapy seeds. Other: Interval near complete resolution of previously demonstrated complex fluid collection within a right inguinal hernia. There is mild asymmetric soft tissue thickening in this area. No herniated bowel. Small umbilical hernia containing only fat. No ascites, free air or peritoneal nodularity. Musculoskeletal: No acute or significant osseous findings. Stable  multilevel endplate degenerative changes throughout the lumbar spine. IMPRESSION: 1. Stable treatment changes in the left hemithorax. 2. No evidence of local recurrence or metastatic disease. Small pulmonary nodules are grossly unchanged, considered benign based on stability. 3. Resolving complex fluid collection within a right inguinal hernia. 4. Distal colonic diverticulosis. 5. Coronary and aortic atherosclerosis (ICD10-I70.0). Emphysema (ICD10-J43.9). Electronically Signed   By: WRichardean SaleM.D.   On: 08/31/2021 19:57   CT Abdomen Pelvis W Contrast  Result Date: 08/31/2021 CLINICAL DATA:  Non-small cell Kennedy cancer. Assess treatment response. * Tracking Code: BO * EXAM: CT CHEST, ABDOMEN, AND PELVIS WITH CONTRAST TECHNIQUE: Multidetector CT imaging of the chest, abdomen and pelvis was performed following the standard protocol during bolus administration of intravenous contrast. RADIATION DOSE REDUCTION: This exam was performed according to the departmental dose-optimization program which includes automated exposure control, adjustment of the mA and/or kV according to patient  size and/or use of iterative reconstruction technique. CONTRAST:  171m OMNIPAQUE IOHEXOL 300 MG/ML  SOLN COMPARISON:  Prior CTs 06/04/2021 and 03/02/2021. FINDINGS: CT CHEST FINDINGS Cardiovascular: Suboptimal contrast bolus. Al allowing for this, no acute vascular findings are identified. Right IJ Port-A-Cath extends to the lower SVC level. Extensive coronary artery atherosclerosis with lesser involvement of the aorta and great vessels. The heart size is stable. No significant pericardial effusion. Mediastinum/Nodes: There are no enlarged mediastinal, hilar or axillary lymph nodes. Tracheostomy remains in place. Possible mild chronic wall thickening of the distal esophagus. Lungs/Pleura: Chronic pleural thickening posteriorly on the left. No significant pleural effusion or pneumothorax. There are chronic radiation changes in the  left perihilar Kennedy with volume loss, architectural distortion and para mediastinal fibrosis. A previously demonstrated subpleural nodule in the left lower lobe is partly obscured by breathing artifact today, although appears similar, measuring 8 mm on image 93/7. No new or enlarging pulmonary nodules. Mild centrilobular and paraseptal emphysema. Musculoskeletal/Chest wall: No chest wall mass or suspicious osseous findings. Scattered ill-defined lucencies within the thoracic spine are stable, considered benign based on stability. CT ABDOMEN AND PELVIS FINDINGS Hepatobiliary: Hepatic assessment limited by suboptimal contrast bolus. The liver appears grossly unchanged, without focal abnormality. The gallbladder is incompletely distended. No evidence of gallstones, gallbladder wall thickening or biliary dilatation. Pancreas: Unremarkable. No pancreatic ductal dilatation or surrounding inflammatory changes. Spleen: Normal in size without focal abnormality. Adrenals/Urinary Tract: Both adrenal glands appear normal. The kidneys appear stable without urinary tract calculus, hydronephrosis or suspicious lesion. There is a stable small parapelvic cyst in the upper pole of the right kidney for which no follow-up imaging is recommended. Mild bladder wall thickening attributed to incomplete distension. No focal bladder abnormality identified. Stomach/Bowel: Enteric contrast was administered and has passed into the distal small bowel. The stomach appears unremarkable for its degree of distension. No evidence of bowel wall thickening, distention or surrounding inflammatory change. Diverticular changes throughout the descending and sigmoid colon. The appendix appears normal. Vascular/Lymphatic: There are no enlarged abdominal or pelvic lymph nodes. Aortic and branch vessel atherosclerosis without evidence of aneurysm. Reproductive: Prostate brachytherapy seeds. Other: Interval near complete resolution of previously demonstrated  complex fluid collection within a right inguinal hernia. There is mild asymmetric soft tissue thickening in this area. No herniated bowel. Small umbilical hernia containing only fat. No ascites, free air or peritoneal nodularity. Musculoskeletal: No acute or significant osseous findings. Stable multilevel endplate degenerative changes throughout the lumbar spine. IMPRESSION: 1. Stable treatment changes in the left hemithorax. 2. No evidence of local recurrence or metastatic disease. Small pulmonary nodules are grossly unchanged, considered benign based on stability. 3. Resolving complex fluid collection within a right inguinal hernia. 4. Distal colonic diverticulosis. 5. Coronary and aortic atherosclerosis (ICD10-I70.0). Emphysema (ICD10-J43.9). Electronically Signed   By: WRichardean SaleM.D.   On: 08/31/2021 19:57     ASSESSMENT/PLAN:  This is a very pleasant 81year old [name] diagnosed with stage IV (T3, N2, M1a) non-small cell Kennedy cancer, squamous cell carcinoma.  He presented with a left lower lobe obstructive Kennedy mass in addition to mediastinal lymphadenopathy.  He also presented with bilateral pulmonary nodules.  He was diagnosed in July 2021.  His PD-L1 expression is negative.   He completed palliative radiotherapy to the obstructive Kennedy mass under the care of Dr. MLisbeth Renshawin August 2021.   The patient is currently undergoing systemic chemotherapy with carboplatin and paclitaxel for 2 cycles in addition to immunotherapy with ipilimumab 1 mg/KG every  6 weeks and nivolumab 360 mg IV every 3 weeks.  He is status post 18 cycles.    Labs were reviewed.  Recommend that he proceed with cycle 18 day 22. However, he was not scheduled for infusion today. We will arrange for the patient to come back tomorrow afternoon for treatment.   We will see him back for follow-up visit in 3 weeks for evaluation before starting day 1 cycle 19.   He will continue to follow with ENT for his  tracheostomy.    The patient was advised to call immediately if he has any concerning symptoms in the interval.  He may go on vacation in September. He was given his calendar. If any of his appointments conflict with his vacation, advised to call us back and we can work around his vacation schedule.   The patient was advised to call immediately if he has any concerning symptoms in the interval. The patient voices understanding of current disease status and treatment options and is in agreement with the current care plan. All questions were answered. The patient knows to call the clinic with any problems, questions or concerns. We can certainly see the patient much sooner if necessary    No orders of the defined types were placed in this encounter.    The total time spent in the appointment was 20-29 minutes  Giles Currie L Morrie Daywalt, PA-C 09/23/21

## 2021-09-24 ENCOUNTER — Inpatient Hospital Stay: Payer: Medicare HMO

## 2021-09-24 VITALS — BP 134/58 | HR 66 | Temp 98.6°F | Resp 17

## 2021-09-24 DIAGNOSIS — Z87891 Personal history of nicotine dependence: Secondary | ICD-10-CM | POA: Diagnosis not present

## 2021-09-24 DIAGNOSIS — Z7982 Long term (current) use of aspirin: Secondary | ICD-10-CM | POA: Diagnosis not present

## 2021-09-24 DIAGNOSIS — Z7984 Long term (current) use of oral hypoglycemic drugs: Secondary | ICD-10-CM | POA: Diagnosis not present

## 2021-09-24 DIAGNOSIS — C3432 Malignant neoplasm of lower lobe, left bronchus or lung: Secondary | ICD-10-CM

## 2021-09-24 DIAGNOSIS — Z5112 Encounter for antineoplastic immunotherapy: Secondary | ICD-10-CM | POA: Diagnosis not present

## 2021-09-24 DIAGNOSIS — Z7952 Long term (current) use of systemic steroids: Secondary | ICD-10-CM | POA: Diagnosis not present

## 2021-09-24 DIAGNOSIS — Z79899 Other long term (current) drug therapy: Secondary | ICD-10-CM | POA: Diagnosis not present

## 2021-09-24 DIAGNOSIS — Z923 Personal history of irradiation: Secondary | ICD-10-CM | POA: Diagnosis not present

## 2021-09-24 LAB — T4: T4, Total: 7.1 ug/dL (ref 4.5–12.0)

## 2021-09-24 MED ORDER — HEPARIN SOD (PORK) LOCK FLUSH 100 UNIT/ML IV SOLN
500.0000 [IU] | Freq: Once | INTRAVENOUS | Status: AC | PRN
  Administered 2021-09-24: 500 [IU]

## 2021-09-24 MED ORDER — SODIUM CHLORIDE 0.9% FLUSH
10.0000 mL | INTRAVENOUS | Status: DC | PRN
  Administered 2021-09-24: 10 mL

## 2021-09-24 MED ORDER — SODIUM CHLORIDE 0.9 % IV SOLN
360.0000 mg | Freq: Once | INTRAVENOUS | Status: AC
  Administered 2021-09-24: 360 mg via INTRAVENOUS
  Filled 2021-09-24: qty 24

## 2021-09-24 MED ORDER — SODIUM CHLORIDE 0.9 % IV SOLN: Freq: Once | INTRAVENOUS | Status: AC

## 2021-09-24 NOTE — Patient Instructions (Signed)
[facility]  Discharge Instructions: Thank you for choosing [facility] to provide your oncology and hematology care.   If you have a lab appointment with the [facility], please go directly to the [facility] and check in at the registration area.   Wear comfortable clothing and clothing appropriate for easy access to any Portacath or PICC line.   We strive to give you quality time with your provider. You may need to reschedule your appointment if you arrive late (15 or more minutes).  Arriving late affects you and other patients whose appointments are after yours.  Also, if you miss three or more appointments without notifying the office, you may be dismissed from the clinic at the provider's discretion.      For prescription refill requests, have your pharmacy contact our office and allow 72 hours for refills to be completed.    Today you received the following chemotherapy and/or immunotherapy agents: Opdivo      To help prevent nausea and vomiting after your treatment, we encourage you to take your nausea medication as directed.  BELOW ARE SYMPTOMS THAT SHOULD BE REPORTED IMMEDIATELY: *FEVER GREATER THAN 100.4 F (38 C) OR HIGHER *CHILLS OR SWEATING *NAUSEA AND VOMITING THAT IS NOT CONTROLLED WITH YOUR NAUSEA MEDICATION *UNUSUAL SHORTNESS OF BREATH *UNUSUAL BRUISING OR BLEEDING *URINARY PROBLEMS (pain or burning when urinating, or frequent urination) *BOWEL PROBLEMS (unusual diarrhea, constipation, pain near the anus) TENDERNESS IN MOUTH AND THROAT WITH OR WITHOUT PRESENCE OF ULCERS (sore throat, sores in mouth, or a toothache) UNUSUAL RASH, SWELLING OR PAIN  UNUSUAL VAGINAL DISCHARGE OR ITCHING   Items with * indicate a potential emergency and should be followed up as soon as possible or go to the Emergency Department if any problems should occur.  Please show the CHEMOTHERAPY ALERT CARD or IMMUNOTHERAPY ALERT CARD at check-in to the  Emergency Department and triage nurse.  Should you have questions after your visit or need to cancel or reschedule your appointment, please contact [facility]  Dept: [phone]  and follow the prompts.  Office hours are 8:00 a.m. to 4:30 p.m. Monday - Friday. Please note that voicemails left after 4:00 p.m. may not be returned until the following business day.  We are closed weekends and major holidays. You have access to a nurse at all times for urgent questions. Please call the main number to the clinic Dept: [phone] and follow the prompts.   For any non-urgent questions, you may also contact your provider using MyChart. We now offer e-Visits for anyone 26 and older to request care online for non-urgent symptoms. For details visit [email or web address].   Also download the MyChart app! Go to the app store, search "MyChart", open the app, select [facility], and log in with your MyChart username and password.  Masks are optional in the cancer centers. If you would like for your care team to wear a mask while they are taking care of you, please let them know. For doctor visits, patients may have with them one support person who is at least 83 years old. At this time, visitors are not allowed in the infusion area.

## 2021-10-01 ENCOUNTER — Other Ambulatory Visit: Payer: Self-pay | Admitting: Medical

## 2021-10-01 ENCOUNTER — Other Ambulatory Visit: Payer: Self-pay

## 2021-10-01 NOTE — Patient Outreach (Addendum)
[facility]) Care Management  10/01/2021  Shaun Kennedy 04-Jun-1940 [id number]   Telephone Assessment   Successful outreach call to patient. He is currently on vacation and out of town. States he will be returning later today. Patient complains of "itching all over." He saw MD recently regarding this and was given meds including ointment. He took ointment-helped some but has ran out. He will call pharmacy to get refill. Also encouraged him to take Atarax as ordered and prescribed as he voices he only took one dose and couldn't tell a difference so stopped taking it. He complains of some mild intermittent pain to left side. Denies any SOB or issues with trach. Denies any recent heavy lifting or injuries. He voices he has MD appt on Monday and plans to discuss issues with MD.     Care Plan : RN Care Manager POC  Updates made by [name], RN since 10/01/2021 12:00 AM     Problem: Chronic Disease Mgmt of Chronic Condition-lung CA, DM   Priority: High     Long-Range Goal: Development of POC for Mgmt of Chronic Condition-DM   Start Date: 02/17/2021  Expected End Date: 02/17/2022  Recent Progress: On track  Priority: High  Note:    Current Barriers:  Chronic Disease Management support and education needs related to DMII   RNCM Clinical Goal(s):  Patient will verbalize understanding of plan for management of DMII as evidenced by mgmt of chronic condition demonstrate Ongoing health management independence as evidenced by A1C level within target range continue to work with RN Care Manager to address care management and care coordination needs related to  DMII as evidenced by adherence to CM Team Scheduled appointments through collaboration with RN Care manager, provider, and care team.   Interventions: POC sent to PCP upon initial assessment, quarterly and with any changes in patient's conditions Inter-disciplinary care team collaboration (see  longitudinal plan of care) Evaluation of current treatment plan related to  self management and patient's adherence to plan as established by provider   Diabetes Interventions:  (Status:  Condition stable.  Not addressed this visit.) Long Term Goal Assessed patient's understanding of A1c goal: <7% Reviewed medications with patient and discussed importance of medication adherence Discussed plans with patient for ongoing care management follow up and provided patient with direct contact information for care management team Advised patient, providing education and rationale, to check cbg as ordered and record, calling MD for findings outside established parameters Lab Results  Component Value Date   HGBA1C 6.0 (A) 07/30/2020  06/17/21-Spouse reports patient's appetite remains good. Wgt stale. Blood sugars ranging in the low 100s. Blood sugar this morning was 104.  Patient Goals/Self-Care Activities: Take all medications as prescribed Attend all scheduled provider appointments Call provider office for new concerns or questions  schedule appointment with eye doctor check feet daily for cuts, sores or redness take the blood sugar log to all doctor visits  Follow Up Plan:  Telephone follow up appointment with care management team member scheduled for:  within the month of Oct The patient has been provided with contact information for the care management team and has been advised to call with any health related questions or concerns.      Long-Range Goal: Development of POC for Mgmt of Chronic Condition-Cancer   Start Date: 02/17/2021  Expected End Date: 02/17/2022  This Visit's Progress: On track  Recent Progress: On track  Priority: High  Note:   Current Barriers:  Chronic  Disease Management support and education needs related to Cancer(lung)   RNCM Clinical Goal(s):  Patient will verbalize understanding of plan for management of cancer as evidenced by mgmt of chronic condition take all  medications exactly as prescribed and will call provider for medication related questions as evidenced by med adherence/compliance attend all scheduled medical appointments: including cancer tx appts as evidenced by completion of chemo/radiation tx sessions continue to work with RN Care Manager to address care management and care coordination needs related to  cancer as evidenced by adherence to CM Team Scheduled appointments through collaboration with RN Care manager, provider, and care team.   Interventions: POC sent to PCP upon initial assessment, quarterly and with any changes in patient's conditions Inter-disciplinary care team collaboration (see longitudinal plan of care) Evaluation of current treatment plan related to  self management and patient's adherence to plan as established by provider   Oncology:  (Status: Goal on track:  Yes.) Long Term Goal Assessment of understanding of oncology diagnosis:  Assessed patient understanding of cancer diagnosis and recommended treatment plan, Reviewed upcoming provider appointments and treatment appointments, Assessed support system. Has consistent/reliable family or other support: Yes, and Nutrition assessment performed   04/13/21-Patient reports txs going well. He is going very Wed for txs and tolerating them. Denies any SEs from txs at present.  06/17/21-Patient continues to get tx bout every 3wks and tolerating them well. Last scan showed no disease progression.  10/01/21-patient reports condition table-no new changes. Trach intact and no recent issues. Patient Goals/Self-Care Activities: Take all medications as prescribed Attend all scheduled provider appointments Call provider office for new concerns or questions   Follow Up Plan:  Telephone follow up appointment with care management team member scheduled for:  within the month of Oct The patient has been provided with contact information for the care management team and has been advised to  call with any health related questions or concerns.         Plan: RN CM discussed with patient next outreach within the month of Oct. Patient agrees to care plan and follow up.  [name], RN,BSN,CCM [place] Management Telephonic Care Management Coordinator Direct Phone: [phone] Toll Free: [phone] Fax: [phone]

## 2021-10-04 ENCOUNTER — Ambulatory Visit: Payer: Medicare HMO | Admitting: Medical

## 2021-10-04 ENCOUNTER — Encounter: Payer: Self-pay | Admitting: Family Medicine

## 2021-10-04 ENCOUNTER — Other Ambulatory Visit: Payer: Self-pay

## 2021-10-04 ENCOUNTER — Ambulatory Visit (INDEPENDENT_AMBULATORY_CARE_PROVIDER_SITE_OTHER): Payer: Medicare HMO | Admitting: Family Medicine

## 2021-10-04 VITALS — BP 142/80 | HR 87 | Temp 98.1°F | Wt 189.0 lb

## 2021-10-04 DIAGNOSIS — C3432 Malignant neoplasm of lower lobe, left bronchus or lung: Secondary | ICD-10-CM | POA: Diagnosis not present

## 2021-10-04 DIAGNOSIS — Z930 Tracheostomy status: Secondary | ICD-10-CM

## 2021-10-04 DIAGNOSIS — L299 Pruritus, unspecified: Secondary | ICD-10-CM | POA: Diagnosis not present

## 2021-10-04 DIAGNOSIS — E119 Type 2 diabetes mellitus without complications: Secondary | ICD-10-CM | POA: Diagnosis not present

## 2021-10-04 LAB — CBC WITH DIFFERENTIAL/PLATELET

## 2021-10-04 NOTE — Progress Notes (Signed)
   Subjective:    Patient ID: Shaun Kennedy, male    DOB: 12-24-1940, 81 y.o.   MRN: [id number]  HPI He complains of a 1 month history of diffuse itching.  He has been using topical medications for this.  No fever, chills, cough or congestion.  He does have an underlying history of lung cancer as well as diabetes but is having no difficulty with either of these at the present time.   Review of Systems     Objective:   Physical Exam Alert and in no distress.  Exam of his skin shows scattered areas of healing lesions presumably secondary to him scratching.       Assessment & Plan:  Pr[place] - Plan: CBC with Differential/Platelet, Comprehensive metabolic panel, Sedimentation rate, Ambulatory referral to Dermatology  Diabetes mellitus type II, non insulin dependent (HCC)  Malignant neoplasm of lower lobe of left lung ([place])  Tracheostomy status ([place]) I am unsure what is causing his itching but I see nothing so I will do basic blood work to make sure nothing shows up there especially with his underlying history of diabetes as well as lung cancer.

## 2021-10-05 ENCOUNTER — Other Ambulatory Visit: Payer: Self-pay

## 2021-10-05 ENCOUNTER — Encounter: Payer: Self-pay | Admitting: Internal Medicine

## 2021-10-05 LAB — COMPREHENSIVE METABOLIC PANEL
ALT: 14 IU/L (ref 0–44)
AST: 19 IU/L (ref 0–40)
Albumin/Globulin Ratio: 1 — ABNORMAL LOW (ref 1.2–2.2)
Albumin: 4 g/dL (ref 3.7–4.7)
Alkaline Phosphatase: 75 IU/L (ref 44–121)
BUN/Creatinine Ratio: 10 (ref 10–24)
BUN: 12 mg/dL (ref 8–27)
Bilirubin Total: 0.6 mg/dL (ref 0.0–1.2)
CO2: 25 mmol/L (ref 20–29)
Calcium: 9.3 mg/dL (ref 8.6–10.2)
Chloride: 100 mmol/L (ref 96–106)
Creatinine, Ser: 1.17 mg/dL (ref 0.76–1.27)
Globulin, Total: 4 g/dL (ref 1.5–4.5)
Glucose: 86 mg/dL (ref 70–99)
Potassium: 4.6 mmol/L (ref 3.5–5.2)
Sodium: 146 mmol/L — ABNORMAL HIGH (ref 134–144)
Total Protein: 8 g/dL (ref 6.0–8.5)
eGFR: 62 mL/min/{1.73_m2} (ref >59)

## 2021-10-05 LAB — CBC WITH DIFFERENTIAL/PLATELET
Basophils Absolute: 0 10*3/uL (ref 0.0–0.2)
Basos: 0 %
EOS (ABSOLUTE): 0.9 10*3/uL — ABNORMAL HIGH (ref 0.0–0.4)
Eos: 10 %
Hematocrit: 33.9 % — ABNORMAL LOW (ref 37.5–51.0)
Hemoglobin: 11.3 g/dL — ABNORMAL LOW (ref 13.0–17.7)
Immature Grans (Abs): 0 10*3/uL (ref 0.0–0.1)
Immature Granulocytes: 0 %
Lymphocytes Absolute: 1.6 10*3/uL (ref 0.7–3.1)
Lymphs: 18 %
MCH: 30.8 pg (ref 26.6–33.0)
MCHC: 33.3 g/dL (ref 31.5–35.7)
MCV: 92 fL (ref 79–97)
Monocytes Absolute: 0.8 10*3/uL (ref 0.1–0.9)
Monocytes: 9 %
Neutrophils Absolute: 5.8 10*3/uL (ref 1.4–7.0)
Neutrophils: 63 %
Platelets: 296 10*3/uL (ref 150–450)
RBC: 3.67 x10E6/uL — ABNORMAL LOW (ref 4.14–5.80)
RDW: 12.5 % (ref 11.6–15.4)
WBC: 9.1 10*3/uL (ref 3.4–10.8)

## 2021-10-05 LAB — SEDIMENTATION RATE: Sed Rate: 73 mm/hr — ABNORMAL HIGH (ref 0–30)

## 2021-10-06 ENCOUNTER — Other Ambulatory Visit: Payer: Self-pay

## 2021-10-06 NOTE — Patient Outreach (Signed)
[facility]) Care Management  10/06/2021  Shaun Kennedy Dec 03, 1940 [id number]   Case Closure    Case is being transferred. Assigned RN CM will outreach and follow up with patient.    [name], RN,BSN,CCM [place] Management Telephonic Care Management Coordinator Direct Phone: [phone] Toll Free: [phone] Fax: [phone]

## 2021-10-13 ENCOUNTER — Other Ambulatory Visit: Payer: Self-pay

## 2021-10-13 ENCOUNTER — Inpatient Hospital Stay: Payer: Medicare HMO

## 2021-10-13 ENCOUNTER — Encounter: Payer: Self-pay | Admitting: Internal Medicine

## 2021-10-13 ENCOUNTER — Inpatient Hospital Stay: Payer: Medicare HMO | Attending: Internal Medicine | Admitting: Internal Medicine

## 2021-10-13 DIAGNOSIS — Z95828 Presence of other vascular implants and grafts: Secondary | ICD-10-CM

## 2021-10-13 DIAGNOSIS — Z7984 Long term (current) use of oral hypoglycemic drugs: Secondary | ICD-10-CM | POA: Insufficient documentation

## 2021-10-13 DIAGNOSIS — Z79899 Other long term (current) drug therapy: Secondary | ICD-10-CM | POA: Diagnosis not present

## 2021-10-13 DIAGNOSIS — C3432 Malignant neoplasm of lower lobe, left bronchus or lung: Secondary | ICD-10-CM | POA: Diagnosis not present

## 2021-10-13 DIAGNOSIS — Z5112 Encounter for antineoplastic immunotherapy: Secondary | ICD-10-CM | POA: Diagnosis not present

## 2021-10-13 DIAGNOSIS — E119 Type 2 diabetes mellitus without complications: Secondary | ICD-10-CM | POA: Diagnosis not present

## 2021-10-13 DIAGNOSIS — E785 Hyperlipidemia, unspecified: Secondary | ICD-10-CM | POA: Insufficient documentation

## 2021-10-13 DIAGNOSIS — I10 Essential (primary) hypertension: Secondary | ICD-10-CM | POA: Insufficient documentation

## 2021-10-13 DIAGNOSIS — Z87891 Personal history of nicotine dependence: Secondary | ICD-10-CM | POA: Diagnosis not present

## 2021-10-13 DIAGNOSIS — Z7982 Long term (current) use of aspirin: Secondary | ICD-10-CM | POA: Diagnosis not present

## 2021-10-13 LAB — CBC WITH DIFFERENTIAL (CANCER CENTER ONLY)
Abs Immature Granulocytes: 0.02 10*3/uL (ref 0.00–0.07)
Basophils Absolute: 0 10*3/uL (ref 0.0–0.1)
Basophils Relative: 0 %
Eosinophils Absolute: 0.6 10*3/uL — ABNORMAL HIGH (ref 0.0–0.5)
Eosinophils Relative: 9 %
HCT: 32.3 % — ABNORMAL LOW (ref 39.0–52.0)
Hemoglobin: 11 g/dL — ABNORMAL LOW (ref 13.0–17.0)
Immature Granulocytes: 0 %
Lymphocytes Relative: 19 %
Lymphs Abs: 1.2 10*3/uL (ref 0.7–4.0)
MCH: 31.8 pg (ref 26.0–34.0)
MCHC: 34.1 g/dL (ref 30.0–36.0)
MCV: 93.4 fL (ref 80.0–100.0)
Monocytes Absolute: 0.7 10*3/uL (ref 0.1–1.0)
Monocytes Relative: 11 %
Neutro Abs: 3.8 10*3/uL (ref 1.7–7.7)
Neutrophils Relative %: 61 %
Platelet Count: 345 10*3/uL (ref 150–400)
RBC: 3.46 MIL/uL — ABNORMAL LOW (ref 4.22–5.81)
RDW: 12.4 % (ref 11.5–15.5)
WBC Count: 6.3 10*3/uL (ref 4.0–10.5)
nRBC: 0 % (ref 0.0–0.2)

## 2021-10-13 LAB — CMP (CANCER CENTER ONLY)
ALT: 13 U/L (ref 0–44)
AST: 14 U/L — ABNORMAL LOW (ref 15–41)
Albumin: 3.7 g/dL (ref 3.5–5.0)
Alkaline Phosphatase: 64 U/L (ref 38–126)
Anion gap: 4 — ABNORMAL LOW (ref 5–15)
BUN: 18 mg/dL (ref 8–23)
CO2: 30 mmol/L (ref 22–32)
Calcium: 9.3 mg/dL (ref 8.9–10.3)
Chloride: 105 mmol/L (ref 98–111)
Creatinine: 1.11 mg/dL (ref 0.61–1.24)
GFR, Estimated: >60 mL/min (ref >60)
Glucose, Bld: 86 mg/dL (ref 70–99)
Potassium: 3.9 mmol/L (ref 3.5–5.1)
Sodium: 139 mmol/L (ref 135–145)
Total Bilirubin: 0.6 mg/dL (ref 0.3–1.2)
Total Protein: 8.3 g/dL — ABNORMAL HIGH (ref 6.5–8.1)

## 2021-10-13 LAB — TSH: TSH: 1.495 u[IU]/mL (ref 0.350–4.500)

## 2021-10-13 MED ORDER — SODIUM CHLORIDE 0.9 % IV SOLN
80.0000 mg | Freq: Once | INTRAVENOUS | Status: AC
  Administered 2021-10-13: 80 mg via INTRAVENOUS
  Filled 2021-10-13: qty 16

## 2021-10-13 MED ORDER — SODIUM CHLORIDE 0.9% FLUSH
10.0000 mL | INTRAVENOUS | Status: DC | PRN
  Administered 2021-10-13: 10 mL

## 2021-10-13 MED ORDER — SODIUM CHLORIDE 0.9 % IV SOLN: Freq: Once | INTRAVENOUS | Status: AC

## 2021-10-13 MED ORDER — SODIUM CHLORIDE 0.9 % IV SOLN
360.0000 mg | Freq: Once | INTRAVENOUS | Status: AC
  Administered 2021-10-13: 360 mg via INTRAVENOUS
  Filled 2021-10-13: qty 24

## 2021-10-13 MED ORDER — SODIUM CHLORIDE 0.9% FLUSH
10.0000 mL | Freq: Once | INTRAVENOUS | Status: AC
  Administered 2021-10-13: 10 mL

## 2021-10-13 MED ORDER — FAMOTIDINE IN NACL 20-0.9 MG/50ML-% IV SOLN
20.0000 mg | Freq: Once | INTRAVENOUS | Status: AC
  Administered 2021-10-13: 20 mg via INTRAVENOUS
  Filled 2021-10-13: qty 50

## 2021-10-13 MED ORDER — HEPARIN SOD (PORK) LOCK FLUSH 100 UNIT/ML IV SOLN
500.0000 [IU] | Freq: Once | INTRAVENOUS | Status: AC | PRN
  Administered 2021-10-13: 500 [IU]

## 2021-10-13 MED ORDER — DIPHENHYDRAMINE HCL 50 MG/ML IJ SOLN
25.0000 mg | Freq: Once | INTRAMUSCULAR | Status: AC
  Administered 2021-10-13: 25 mg via INTRAVENOUS
  Filled 2021-10-13: qty 1

## 2021-10-13 MED ORDER — ALTEPLASE 2 MG IJ SOLR
2.0000 mg | Freq: Once | INTRAMUSCULAR | Status: AC
  Administered 2021-10-13: 2 mg
  Filled 2021-10-13: qty 2

## 2021-10-13 MED ORDER — HYDROXYZINE HCL 10 MG PO TABS: 10.0000 mg | ORAL_TABLET | Freq: Three times a day (TID) | ORAL | 1 refills | Status: DC | PRN

## 2021-10-13 NOTE — Progress Notes (Signed)
[place] [phone]   [phone]  OFFICE PROGRESS NOTE  Shaun Kennedy, [place] [address]  DIAGNOSIS: stage IV (T3, N2, M1 a) non-small cell Kennedy cancer, squamous cell carcinoma presented with obstructive left lower lobe Kennedy mass in addition to mediastinal lymphadenopathy as well as bilateral pulmonary nodules diagnosed in July 2021.   PDL1: 0%   PRIOR THERAPY: None   CURRENT THERAPY: 1) Palliative radiotherapy to the obstructive left lower lobe Kennedy mass under the care of Dr. Lisbeth Renshaw.  2)  systemic chemotherapy 2 cycles of chemotherapy with carboplatin for an AUC of 5 and paclitaxel 175 mg/m2 in addition to immunotherapy with nivolumab 360 mg every 3 weeks and ipilimumab 1 mg/kg IV every 6 weeks followed by maintenance nivolumab and ipilimumab.  He started the first treatment on 09/04/2019.  He is status post 18 cycles.  INTERVAL HISTORY: Shaun Kennedy 81 y.o. male returns to the clinic today for follow-up visit.  The patient is feeling fine today with no concerning complaints except for mild itching and he uses Atarax as needed.  He denied having any current chest pain but has shortness of breath with exertion with no cough or hemoptysis.  He has no nausea, vomiting, diarrhea or constipation.  He has no headache or visual changes.  He has been tolerating his treatment with immunotherapy fairly well.  He is here today for evaluation before starting day 1 of cycle #19.  MEDICAL HISTORY: Past Medical History:  Diagnosis Date   Allergic rhinitis    Asthma    Carotid stenosis    Colon cancer ([place]) 2003   Diabetes mellitus ([place])    Diverticulosis    Dyslipidemia    ED (erectile dysfunction)    GERD (gastroesophageal reflux disease)    H/O degenerative disc disease    Hemorrhoids    HTN (hypertension)    as a child   Hyperlipidemia    Kennedy cancer ([name])    LVH (left ventricular hypertrophy)    on EKG   Mass  of lower lobe of left Kennedy    Mediastinal adenopathy    Smoker    former   Wears dentures    Wears dentures    Wears glasses    Wears glasses     ALLERGIES:  has No Known Allergies.  MEDICATIONS:  Current Outpatient Medications  Medication Sig Dispense Refill   Alcohol Swabs (DROPSAFE ALCOHOL PREP) 70 % PADS Apply topically.     amLODipine (NORVASC) 5 MG tablet TAKE 1 TABLET EVERY DAY 90 tablet 3   aspirin EC 81 MG tablet Take 81 mg by mouth daily after breakfast.      betamethasone valerate ointment (VALISONE) 0.1 % APPLY 1 APPLICATION TOPICALLY 2 TIMES DAILY 45 g 0   bisacodyl (DULCOLAX) 5 MG EC tablet Take 10 mg by mouth daily as needed for moderate constipation.     Blood Glucose Calibration (TRUE METRIX LEVEL 1) Low SOLN      Borage, Borago officinalis, (BORAGE OIL PO) Take 2,300 mg by mouth.     cholecalciferol (VITAMIN D) 25 MCG (1000 UNIT) tablet Take 1,000 Units by mouth daily.     donepezil (ARICEPT) 5 MG tablet Take 1 tablet (5 mg total) by mouth at bedtime. 90 tablet 0   dronabinol (MARINOL) 2.5 MG capsule Take 1 capsule (2.5 mg total) by mouth 2 (two) times daily before a meal. 60 capsule 1   ferrous sulfate  325 (65 FE) MG EC tablet TAKE 1 TABLET (325 MG TOTAL) DAILY WITH BREAKFAST. 90 tablet 1   gabapentin (NEURONTIN) 100 MG capsule Take by mouth.     hydrOXYzine (ATARAX) 10 MG tablet Take 1 tablet (10 mg total) by mouth 3 (three) times daily as needed. 90 tablet 1   Lancet Devices (TRUEDRAW LANCING DEVICE) MISC Check sugars daily 90 each 0   linaclotide (LINZESS) 72 MCG capsule Take by mouth.     lisinopril-hydrochlorothiazide (ZESTORETIC) 20-12.5 MG tablet TAKE 1 TABLET EVERY DAY 90 tablet 1   metFORMIN (GLUCOPHAGE-XR) 500 MG 24 hr tablet TAKE 1 TABLET EVERY DAY 90 tablet 0   Multiple Vitamin (MULTIVITAMIN WITH MINERALS) TABS tablet Take 1 tablet by mouth daily after breakfast.      simvastatin (ZOCOR) 40 MG tablet TAKE 1 TABLET EVERY DAY 90 tablet 1   solifenacin  (VESICARE) 5 MG tablet TAKE 1 TABLET EVERY DAY 90 tablet 1   TRUE METRIX BLOOD GLUCOSE TEST test strip SMARTSIG:Via Meter     TRUEplus Lancets 33G MISC 1 each by Does not apply route 2 (two) times daily. 200 each 4   No current facility-administered medications for this visit.    SURGICAL HISTORY:  Past Surgical History:  Procedure Laterality Date   BRONCHIAL BIOPSY  08/20/2019   Procedure: BRONCHIAL BIOPSIES;  Surgeon: Byrum, Robert S, MD;  Location: [facility];  Service: Pulmonary;;   BRONCHIAL BRUSHINGS  08/20/2019   Procedure: BRONCHIAL BRUSHINGS;  Surgeon: Byrum, Robert S, MD;  Location: [facility];  Service: Pulmonary;;   BRONCHIAL NEEDLE ASPIRATION BIOPSY  08/20/2019   Procedure: BRONCHIAL NEEDLE ASPIRATION BIOPSIES;  Surgeon: Byrum, Robert S, MD;  Location: [facility];  Service: Pulmonary;;   CARPAL TUNNEL RELEASE Right 01/09/2019   Procedure: CARPAL TUNNEL RELEASE;  Surgeon: Xu, Naiping M, MD;  Location: [facility];  Service: Orthopedics;  Laterality: Right;   CARPAL TUNNEL WITH CUBITAL TUNNEL Right 11/18/2020   Procedure: CARPAL TUNNEL WITH CUBITAL TUNNEL;  Surgeon: Weingold, Matthew, MD;  Location: [facility];  Service: Orthopedics;  Laterality: Right;   CATARACT EXTRACTION Right 2018   COLONOSCOPY  2007   Dr. Hung   DIRECT LARYNGOSCOPY N/A 04/10/2020   Procedure: DIRECT LARYNGOSCOPY;  Surgeon: Bates, Dwight, MD;  Location: [facility];  Service: ENT;  Laterality: N/A;   I & D EXTREMITY Right 04/02/2016   Procedure: IRRIGATION AND DEBRIDEMENT GREAT TOE;  Surgeon: Naiping M Xu, MD;  Location: [facility];  Service: Orthopedics;  Laterality: Right;   IR IMAGING GUIDED PORT INSERTION  08/30/2019   MULTIPLE TOOTH EXTRACTIONS     RADIOACTIVE SEED IMPLANT  2003   TRACHEOSTOMY TUBE PLACEMENT N/A 04/10/2020   Procedure: TRACHEOSTOMY;  Surgeon: Bates, Dwight, MD;  Location: [facility];  Service: ENT;  Laterality: N/A;   ULNAR TUNNEL RELEASE Right 01/09/2019   Procedure: RIGHT CUBITAL TUNNEL  RELEASE AND CARPAL TUNNEL RELEASE;  Surgeon: Xu, Naiping M, MD;  Location: [facility];  Service: Orthopedics;  Laterality: Right;   UPPER GASTROINTESTINAL ENDOSCOPY     VIDEO BRONCHOSCOPY N/A 12/18/2019   Procedure: VIDEO BRONCHOSCOPY WITHOUT FLUORO;  Surgeon: Byrum, Robert S, MD;  Location: [facility];  Service: Cardiopulmonary;  Laterality: N/A;   VIDEO BRONCHOSCOPY WITH ENDOBRONCHIAL ULTRASOUND N/A 08/20/2019   Procedure: VIDEO BRONCHOSCOPY WITH ENDOBRONCHIAL ULTRASOUND;  Surgeon: Byrum, Robert S, MD;  Location: [facility];  Service: Pulmonary;  Laterality: N/A;    REVIEW OF SYSTEMS:  A comprehensive review of systems was negative except   for: Integument/breast: positive for pruritus   PHYSICAL EXAMINATION: General appearance: alert, cooperative, and no distress Head: Normocephalic, without obvious abnormality, atraumatic Neck: no adenopathy, no JVD, supple, symmetrical, trachea midline, and thyroid not enlarged, symmetric, no tenderness/mass/nodules Lymph nodes: Cervical, supraclavicular, and axillary nodes normal. Resp: clear to auscultation bilaterally Back: symmetric, no curvature. ROM normal. No CVA tenderness. Cardio: regular rate and rhythm, S1, S2 normal, no murmur, click, rub or gallop GI: soft, non-tender; bowel sounds normal; no masses,  no organomegaly Extremities: extremities normal, atraumatic, no cyanosis or edema  ECOG PERFORMANCE STATUS: 1 - Symptomatic but completely ambulatory  Blood pressure (!) 153/75, pulse 60, temperature 98.2 F (36.8 C), temperature source Oral, resp. rate 16, weight 187 lb 14.4 oz (85.2 kg), SpO2 100 %.  LABORATORY DATA: Lab Results  Component Value Date   WBC 6.3 10/13/2021   HGB 11.0 (L) 10/13/2021   HCT 32.3 (L) 10/13/2021   MCV 93.4 10/13/2021   PLT 345 10/13/2021      Chemistry      Component Value Date/Time   NA 146 (H) 10/04/2021 1616   K 4.6 10/04/2021 1616   CL 100 10/04/2021 1616   CO2 25 10/04/2021 1616    BUN 12 10/04/2021 1616   CREATININE 1.17 10/04/2021 1616   CREATININE 1.10 09/23/2021 0847   CREATININE 1.25 (H) 08/08/2016 0912      Component Value Date/Time   CALCIUM 9.3 10/04/2021 1616   ALKPHOS 75 10/04/2021 1616   AST 19 10/04/2021 1616   AST 18 09/23/2021 0847   ALT 14 10/04/2021 1616   ALT 14 09/23/2021 0847   BILITOT 0.6 10/04/2021 1616   BILITOT 0.8 09/23/2021 0847       RADIOGRAPHIC STUDIES: No results found.  ASSESSMENT AND PLAN: This is a very pleasant 81 years old [name] with stage IV non-small cell Kennedy cancer, squamous cell carcinoma with negative PD-L1 expression. The patient is currently undergoing systemic chemotherapy with carboplatin and paclitaxel for 2 cycles in addition to immunotherapy with ipilimumab 1 mg/KG every 6 weeks and nivolumab 360 mg IV every 3 weeks status post 18 cycles. The patient has been tolerating this treatment well with no concerning adverse effects. I recommended for him to proceed with day 1 of cycle #19 today as planned. For the itching, I gave him refill of Atarax. I will see him back for follow-up visit in 3 weeks for evaluation before starting day #22 of cycle #19. He was advised to call immediately if he has any other concerning symptoms in the interval. The patient voices understanding of current disease status and treatment options and is in agreement with the current care plan.  All questions were answered. The patient knows to call the clinic with any problems, questions or concerns. We can certainly see the patient much sooner if necessary.  Disclaimer: This note was dictated with voice recognition software. Similar sounding words can inadvertently be transcribed and may not be corrected upon review.       

## 2021-10-13 NOTE — Progress Notes (Signed)
Ipilimumab ([facility]OY) Patient Monitoring Assessment   Is the patient experiencing any of the following general symptoms?:  [] Difficulty performing normal activities [] Feeling sluggish or cold all the time [] Unusual weight gain [] Constant or unusual headaches [] Feeling dizzy or faint [] Changes in eyesight (blurry vision, double vision, or other vision problems) [] Changes in mood or behavior (ex: decreased sex drive, irritability, or forgetfulness) [] Starting new medications (ex: steroids, other medications that lower immune response) [x] Patient is not experiencing any of the general symptoms above.    Gastrointestinal  Patient is having 2  bowel movements each day.  Is this different from baseline? [] Yes [x] No Are your stools watery or do they have a foul smell? [] Yes [x] No Have you seen blood in your stools? [] Yes [x] No Are your stools dark, tarry, or sticky? [] Yes [x] No Are you having pain or tenderness in your belly? [] Yes [x] No  Skin Does your skin itch? [] Yes [x] No Do you have a rash? [] Yes [x] No Has your skin blistered and/or peeled? [] Yes [x] No Do you have sores in your mouth? [] Yes [x] No  Hepatic Has your urine been dark or tea colored? [] Yes [x] No Have you noticed that your skin or the whites of your eyes are turning yellow? [] Yes [x] No Are you bleeding or bruising more easily than normal? [] Yes [x] No Are you nauseous and/or vomiting? [] Yes [x] No Do you have pain on the right side of your stomach? [] Yes [x] No  Neurologic  Are you having unusual weakness of legs, arms, or face? [] Yes [x] No Are you having numbness or tingling in your hands or feet? [] Yes [x] No  Shaun Kennedy

## 2021-10-13 NOTE — Patient Instructions (Signed)
[facility]  Discharge Instructions: Thank you for choosing [facility] to provide your oncology and hematology care.   If you have a lab appointment with the [facility], please go directly to the [facility] and check in at the registration area.   Wear comfortable clothing and clothing appropriate for easy access to any Portacath or PICC line.   We strive to give you quality time with your provider. You may need to reschedule your appointment if you arrive late (15 or more minutes).  Arriving late affects you and other patients whose appointments are after yours.  Also, if you miss three or more appointments without notifying the office, you may be dismissed from the clinic at the provider's discretion.      For prescription refill requests, have your pharmacy contact our office and allow 72 hours for refills to be completed.    Today you received the following chemotherapy and/or immunotherapy agents :  Nivolumab,  Ipilimumab.    To help prevent nausea and vomiting after your treatment, we encourage you to take your nausea medication as directed.  BELOW ARE SYMPTOMS THAT SHOULD BE REPORTED IMMEDIATELY: *FEVER GREATER THAN 100.4 F (38 C) OR HIGHER *CHILLS OR SWEATING *NAUSEA AND VOMITING THAT IS NOT CONTROLLED WITH YOUR NAUSEA MEDICATION *UNUSUAL SHORTNESS OF BREATH *UNUSUAL BRUISING OR BLEEDING *URINARY PROBLEMS (pain or burning when urinating, or frequent urination) *BOWEL PROBLEMS (unusual diarrhea, constipation, pain near the anus) TENDERNESS IN MOUTH AND THROAT WITH OR WITHOUT PRESENCE OF ULCERS (sore throat, sores in mouth, or a toothache) UNUSUAL RASH, SWELLING OR PAIN  UNUSUAL VAGINAL DISCHARGE OR ITCHING   Items with * indicate a potential emergency and should be followed up as soon as possible or go to the Emergency Department if any problems should occur.  Please show the CHEMOTHERAPY ALERT CARD or IMMUNOTHERAPY ALERT CARD  at check-in to the Emergency Department and triage nurse.  Should you have questions after your visit or need to cancel or reschedule your appointment, please contact [facility]  Dept: [phone]  and follow the prompts.  Office hours are 8:00 a.m. to 4:30 p.m. Monday - Friday. Please note that voicemails left after 4:00 p.m. may not be returned until the following business day.  We are closed weekends and major holidays. You have access to a nurse at all times for urgent questions. Please call the main number to the clinic Dept: [phone] and follow the prompts.   For any non-urgent questions, you may also contact your provider using MyChart. We now offer e-Visits for anyone 28 and older to request care online for non-urgent symptoms. For details visit [email or web address].   Also download the MyChart app! Go to the app store, search "MyChart", open the app, select [facility], and log in with your MyChart username and password.  Masks are optional in the cancer centers. If you would like for your care team to wear a mask while they are taking care of you, please let them know. You may have one support person who is at least 83 years old accompany you for your appointments.

## 2021-10-14 LAB — T4: T4, Total: 8 ug/dL (ref 4.5–12.0)

## 2021-10-21 ENCOUNTER — Other Ambulatory Visit: Payer: Self-pay | Admitting: *Deleted

## 2021-10-21 DIAGNOSIS — I878 Other specified disorders of veins: Secondary | ICD-10-CM

## 2021-10-26 ENCOUNTER — Ambulatory Visit (HOSPITAL_COMMUNITY)
Admission: RE | Admit: 2021-10-26 | Discharge: 2021-10-26 | Disposition: A | Discharge status: Home / Self Care | Payer: Medicare HMO | Source: Ambulatory Visit | Attending: Acute Care | Admitting: Acute Care

## 2021-10-26 DIAGNOSIS — Z930 Tracheostomy status: Secondary | ICD-10-CM

## 2021-10-26 DIAGNOSIS — C3432 Malignant neoplasm of lower lobe, left bronchus or lung: Secondary | ICD-10-CM | POA: Diagnosis present

## 2021-10-26 DIAGNOSIS — J386 Stenosis of larynx: Secondary | ICD-10-CM | POA: Diagnosis present

## 2021-10-26 NOTE — Assessment & Plan Note (Signed)
Trach status stable Plan ROV 12 weeks Continue routine trach care

## 2021-10-26 NOTE — Progress Notes (Signed)
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  I saw him last June/27/23.  He continues to follow with oncology Currently undergoing palliative XRT for obstructive left lower lobe lung mass and systemic chemotherapy.  Per Dr. Lew Dawes note he is tolerating therapy well he is status post 19 cycles he presents today for planned tracheostomy change  Review of Systems  Constitutional: Negative.   HENT: Negative.    Eyes: Negative.   Respiratory: Negative.    Cardiovascular: Negative.   Gastrointestinal: Negative.   Genitourinary: Negative.   Musculoskeletal: Negative.   Skin: Negative.   Endo/Heme/Allergies: Negative.     Exam General: 81 year old male patient resting in wheelchair currently, however he is ambulatory, actually pushed his wife in a wheelchair to the office HEENT normocephalic atraumatic size 6 cuffless tracheostomy in place.  Still has notable stridor with tracheal occlusion phonation excellent with PMV Pulmonary: Clear to auscultation Cardiac: Regular rate and rhythm Abdomen: Soft nontender Extremity: Warm dry Neuro: Intact.  Procedure  The existing tracheostomy was removed, site inspected, it was unremarkable.  A new size 6 tracheostomy was placed over obturator, patient tolerated well, placement verified with end-tidal CO2. Impression/plan  Principal Problem:   Tracheostomy status ([place]) Active Problems:   Malignant neoplasm of lower lobe of left lung ([place])   Glottic stenosis  Tracheostomy status ([facility]) Overview: Brand: Size:  Cuffless.   Assessment & Plan: Trach status stable Plan ROV 12 weeks Continue routine trach care     My time 14 min   Erick Colace ACNP-BC [facility] Pager # [phone] OR # [phone] if no answer

## 2021-10-27 ENCOUNTER — Telehealth: Payer: Self-pay | Admitting: Internal Medicine

## 2021-10-27 DIAGNOSIS — H268 Other specified cataract: Secondary | ICD-10-CM | POA: Diagnosis not present

## 2021-10-27 DIAGNOSIS — H25812 Combined forms of age-related cataract, left eye: Secondary | ICD-10-CM | POA: Diagnosis not present

## 2021-10-27 NOTE — Progress Notes (Signed)
Tracheostomy Procedure Note  JAVAD SALVA [id number] [birth year]  Pre Procedure Tracheostomy Information  Trach Brand: Shiley Size:  6.0 [id number] Style: Uncuffed Secured by: Velcro   Procedure: Trach cleaning and Trach Change    Post Procedure Tracheostomy Information  Trach Brand: Shiley Size:  6.0 [id number] Style: Uncuffed Secured by: Velcro   Post Procedure Evaluation:  ETCO2 positive color change from yellow to purple : Yes.   Vital signs:VSS Patients current condition: unstable Complications: No apparent complications Trach site exam: clean, dry Wound care done: 4 x 4 gauze drain Patient did tolerate procedure well.   Education: none  Prescription needs: none    Additional needs: none

## 2021-10-27 NOTE — Telephone Encounter (Signed)
Called patient regarding upcoming appointments, spoke with patient's spouse. Patient will receive updated calendar next visit.

## 2021-10-28 ENCOUNTER — Telehealth: Payer: Self-pay | Admitting: Licensed Clinical Social Worker

## 2021-10-28 NOTE — Patient Instructions (Signed)
Visit Information  Thank you for taking time to visit with me today. Please don't hesitate to contact me if I can be of assistance to you.   Following are the goals we discussed today:   Goals Addressed             This Visit's Progress    Obtain Supportive Resources   On track       Our next appointment is by telephone on 11/04/21 at 11 AM  Please call the care guide team at [phone] if you need to cancel or reschedule your appointment.   If you are experiencing a Mental Health or [place] or need someone to talk to, please call the Suicide and Crisis Lifeline: 988 call 911   Patient verbalizes understanding of instructions and care plan provided today and agrees to view in [facility]. Active MyChart status and patient understanding of how to access instructions and care plan via MyChart confirmed with patient.     Christa See, MSW, [email or web address] Phone [phone] 4:40 PM

## 2021-11-01 ENCOUNTER — Ambulatory Visit (INDEPENDENT_AMBULATORY_CARE_PROVIDER_SITE_OTHER): Payer: Medicare HMO | Admitting: Surgery

## 2021-11-01 ENCOUNTER — Ambulatory Visit (HOSPITAL_COMMUNITY)
Admission: RE | Admit: 2021-11-01 | Discharge: 2021-11-01 | Disposition: A | Discharge status: Home / Self Care | Payer: Medicare HMO | Source: Ambulatory Visit | Attending: Surgery | Admitting: Surgery

## 2021-11-01 ENCOUNTER — Encounter: Payer: Self-pay | Admitting: Surgery

## 2021-11-01 VITALS — BP 169/76 | HR 65 | Temp 98.3°F | Resp 20 | Ht 67.0 in | Wt 188.0 lb

## 2021-11-01 DIAGNOSIS — I6523 Occlusion and stenosis of bilateral carotid arteries: Secondary | ICD-10-CM | POA: Diagnosis not present

## 2021-11-01 DIAGNOSIS — I70213 Atherosclerosis of native arteries of extremities with intermittent claudication, bilateral legs: Secondary | ICD-10-CM | POA: Diagnosis not present

## 2021-11-01 DIAGNOSIS — I878 Other specified disorders of veins: Secondary | ICD-10-CM | POA: Insufficient documentation

## 2021-11-01 DIAGNOSIS — I872 Venous insufficiency (chronic) (peripheral): Secondary | ICD-10-CM | POA: Diagnosis not present

## 2021-11-01 NOTE — Progress Notes (Signed)
[facility]  Patient name: Shaun Kennedy MRN: [id number] DOB: [birth year] Sex: male   REQUESTING PROVIDER:    Chana Bode   REASON FOR CONSULT:    Venous disease  HISTORY OF PRESENT ILLNESS:   Shaun Kennedy is a 81 y.o. male, who is furred for evaluation of stasis dermatitis.  He states about 2 months ago, he began developing itching and discoloration all over his body but also in his legs.  He does have some swelling in the right ankle.  This is all relatively new.  The patient has stage IV lung cancer which was diagnosed in July 2021.  He has undergone palliative radiotherapy as well as chemotherapy.  I have seen him in the past for carotid disease, which remains asymptomatic.  He has known lower extremity vascular disease but has not suffered symptoms of claudication.  He is a diabetic.  He takes a statin for hypercholesterolemia as well as an ACE inhibitor for hypertension.  He is a former smoker.  PAST MEDICAL HISTORY    Past Medical History:  Diagnosis Date   Allergic rhinitis    Asthma    Carotid stenosis    Colon cancer ([place]) 2003   Diabetes mellitus ([place])    Diverticulosis    Dyslipidemia    ED (erectile dysfunction)    GERD (gastroesophageal reflux disease)    H/O degenerative disc disease    Hemorrhoids    HTN (hypertension)    as a child   Hyperlipidemia    Lung cancer ([place])    LVH (left ventricular hypertrophy)    on EKG   Mass of lower lobe of left lung    Mediastinal adenopathy    Smoker    former   Wears dentures    Wears dentures    Wears glasses    Wears glasses      FAMILY HISTORY   Family History  Problem Relation Age of Onset   Heart failure Mother    Diabetes Mother    Stroke Father    Diabetes Father    Diabetes Sister    Diabetes Sister    Lung cancer Sister 47   Colon cancer Neg Hx    Esophageal cancer Neg Hx    Rectal cancer Neg Hx    Colon polyps Neg Hx     Stomach cancer Neg Hx     SOCIAL HISTORY:   Social History   Socioeconomic History   Marital status: Married    Spouse name: Not on file   Number of children: 3   Years of education: Not on file   Highest education level: Not on file  Occupational History   Occupation: retired  Tobacco Use   Smoking status: Former    Packs/day: 1.00    Years: 32.00    Total pack years: 32.00    Types: Cigarettes    Start date: 02/08/1955    Quit date: 10/08/1985    Years since quitting: 36.0   Smokeless tobacco: Never  Vaping Use   Vaping Use: Never used  Substance and Sexual Activity   Alcohol use: No   Drug use: No   Sexual activity: Yes  Other Topics Concern   Not on file  Social History Narrative   Married, no pets   Social Determinants of Health   Financial Resource Strain: Not on file  Food Insecurity: No Food Insecurity (02/17/2021)   Hunger Vital Sign    Worried About Running Out  of Food in the Last Year: Never true    [place] in the Last Year: Never true  Transportation Needs: No Transportation Needs (02/17/2021)   PRAPARE - Hydrologist (Medical): No    Lack of Transportation (Non-Medical): No  Physical Activity: Not on file  Stress: Not on file  Social Connections: Not on file  Intimate Partner Violence: Not on file    ALLERGIES:    No Known Allergies  CURRENT MEDICATIONS:    Current Outpatient Medications  Medication Sig Dispense Refill   Alcohol Swabs (DROPSAFE ALCOHOL PREP) 70 % PADS Apply topically.     amLODipine (NORVASC) 5 MG tablet TAKE 1 TABLET EVERY DAY 90 tablet 3   aspirin EC 81 MG tablet Take 81 mg by mouth daily after breakfast.      betamethasone valerate ointment (VALISONE) 0.1 % APPLY 1 APPLICATION TOPICALLY 2 TIMES DAILY 45 g 0   bisacodyl (DULCOLAX) 5 MG EC tablet Take 10 mg by mouth daily as needed for moderate constipation.     Blood Glucose Calibration (TRUE METRIX LEVEL 1) Low SOLN      Borage,  Borago officinalis, (BORAGE OIL PO) Take 2,300 mg by mouth.     cholecalciferol (VITAMIN D) 25 MCG (1000 UNIT) tablet Take 1,000 Units by mouth daily.     donepezil (ARICEPT) 5 MG tablet Take 1 tablet (5 mg total) by mouth at bedtime. 90 tablet 0   dronabinol (MARINOL) 2.5 MG capsule Take 1 capsule (2.5 mg total) by mouth 2 (two) times daily before a meal. 60 capsule 1   ferrous sulfate 325 (65 FE) MG EC tablet TAKE 1 TABLET (325 MG TOTAL) DAILY WITH BREAKFAST. 90 tablet 1   gabapentin (NEURONTIN) 100 MG capsule Take by mouth.     hydrOXYzine (ATARAX) 10 MG tablet Take 1 tablet (10 mg total) by mouth 3 (three) times daily as needed. 90 tablet 1   Lancet Devices (TRUEDRAW LANCING DEVICE) MISC Check sugars daily 90 each 0   linaclotide (LINZESS) 72 MCG capsule Take by mouth.     lisinopril-hydrochlorothiazide (ZESTORETIC) 20-12.5 MG tablet TAKE 1 TABLET EVERY DAY 90 tablet 1   metFORMIN (GLUCOPHAGE-XR) 500 MG 24 hr tablet TAKE 1 TABLET EVERY DAY 90 tablet 0   Multiple Vitamin (MULTIVITAMIN WITH MINERALS) TABS tablet Take 1 tablet by mouth daily after breakfast.      simvastatin (ZOCOR) 40 MG tablet TAKE 1 TABLET EVERY DAY 90 tablet 1   solifenacin (VESICARE) 5 MG tablet TAKE 1 TABLET EVERY DAY 90 tablet 1   TRUE METRIX BLOOD GLUCOSE TEST test strip SMARTSIG:Via Meter     TRUEplus Lancets 33G MISC 1 each by Does not apply route 2 (two) times daily. 200 each 4   No current facility-administered medications for this visit.    REVIEW OF SYSTEMS:   [X]  denotes positive finding, [ ]  denotes negative finding Cardiac  Comments:  Chest pain or chest pressure:    Shortness of breath upon exertion:    Short of breath when lying flat:    Irregular heart rhythm:        Vascular    Pain in calf, thigh, or hip brought on by ambulation:    Pain in feet at night that wakes you up from your sleep:     Blood clot in your veins:    Leg swelling:         Pulmonary    Oxygen at home:  Productive  cough:     Wheezing:         Neurologic    Sudden weakness in arms or legs:     Sudden numbness in arms or legs:     Sudden onset of difficulty speaking or slurred speech:    Temporary loss of vision in one eye:     Problems with dizziness:         Gastrointestinal    Blood in stool:      Vomited blood:         Genitourinary    Burning when urinating:     Blood in urine:        Psychiatric    Major depression:         Hematologic    Bleeding problems:    Problems with blood clotting too easily:        Skin    Rashes or ulcers:        Constitutional    Fever or chills:     PHYSICAL EXAM:   Vitals:   11/01/21 1352  BP: (!) 169/76  Pulse: 65  Resp: 20  Temp: 98.3 F (36.8 C)  SpO2: 95%  Weight: 188 lb (85.3 kg)  Height: 5\' 7"  (1.702 m)    GENERAL: The patient is a well-nourished male, in no acute distress. The vital signs are documented above. CARDIAC: There is a regular rate and rhythm.  VASCULAR: Trace right ankle edema.  Palpable right ankle pulse PULMONARY: Nonlabored respirations.  Trach in place ABDOMEN: Soft and non-tender with normal pitched bowel sounds.  MUSCULOSKELETAL: There are no major deformities or cyanosis. NEUROLOGIC: No focal weakness or paresthesias are detected. SKIN: Diffuse discoloration. PSYCHIATRIC: The patient has a normal affect.  STUDIES:   I have reviewed the following:  Venous Reflux Times  +------------------+---------+------+-----------+------------+-------------  ---+  RIGHT             Reflux NoRefluxReflux TimeDiameter cmsComments                                       Yes                                            +------------------+---------+------+-----------+------------+-------------  ---+  CFV               no                                                       +------------------+---------+------+-----------+------------+-------------  ---+  FV mid            no                                                        +------------------+---------+------+-----------+------------+-------------  ---+  GSV at [facility]        no  0.37                       +------------------+---------+------+-----------+------------+-------------  ---+  GSV prox thigh    no                            0.26                       +------------------+---------+------+-----------+------------+-------------  ---+  GSV mid thigh     no                            0.28                       +------------------+---------+------+-----------+------------+-------------  ---+  GSV dist thigh    no                            0.30                       +------------------+---------+------+-----------+------------+-------------  ---+  GSV at knee       no                            0.21                       +------------------+---------+------+-----------+------------+-------------  ---+  SSV Pop Fossa     no                            0.56    chronic  thrombus  +------------------+---------+------+-----------+------------+-------------  ---+  anterior accessory          yes    >500 ms      0.46                       +------------------+---------+------+-----------+------------+-------------  ---+  ASSESSMENT and PLAN   Carotid: The patient is on surveillance protocol  Lower extremity PAD: The patient is without symptoms despite known stenosis.  He has a palpable pedal pulse on the right  Venous disease: The patient does not have significant reflux in the right leg.  The anterior accessory has reflux but is not of appropriate diameter for treatment.  I do not think his venous disease is contributing significantly to his skin discoloration and edema.  He would potentially benefit from compression socks.  I also discussed that he should talk to Dr. Earlie Server to see if this is potentially related to chemotherapy or whether  or not dermatology should get involved.  Leia Alf, MD, FACS [facility] [phone] Pager [phone]

## 2021-11-02 DIAGNOSIS — L308 Other specified dermatitis: Secondary | ICD-10-CM | POA: Diagnosis not present

## 2021-11-03 ENCOUNTER — Telehealth: Payer: Self-pay | Admitting: Medical Oncology

## 2021-11-03 ENCOUNTER — Inpatient Hospital Stay: Payer: Medicare HMO

## 2021-11-03 ENCOUNTER — Telehealth: Payer: Self-pay

## 2021-11-03 ENCOUNTER — Inpatient Hospital Stay: Payer: Medicare HMO | Admitting: Internal Medicine

## 2021-11-03 ENCOUNTER — Other Ambulatory Visit: Payer: Self-pay

## 2021-11-03 ENCOUNTER — Inpatient Hospital Stay (HOSPITAL_BASED_OUTPATIENT_CLINIC_OR_DEPARTMENT_OTHER): Payer: Medicare HMO | Admitting: Internal Medicine

## 2021-11-03 VITALS — BP 141/64 | HR 78 | Temp 99.2°F | Resp 16 | Wt 189.6 lb

## 2021-11-03 DIAGNOSIS — Z95828 Presence of other vascular implants and grafts: Secondary | ICD-10-CM

## 2021-11-03 DIAGNOSIS — E119 Type 2 diabetes mellitus without complications: Secondary | ICD-10-CM | POA: Diagnosis not present

## 2021-11-03 DIAGNOSIS — Z87891 Personal history of nicotine dependence: Secondary | ICD-10-CM | POA: Diagnosis not present

## 2021-11-03 DIAGNOSIS — I10 Essential (primary) hypertension: Secondary | ICD-10-CM | POA: Diagnosis not present

## 2021-11-03 DIAGNOSIS — Z7984 Long term (current) use of oral hypoglycemic drugs: Secondary | ICD-10-CM | POA: Diagnosis not present

## 2021-11-03 DIAGNOSIS — Z7982 Long term (current) use of aspirin: Secondary | ICD-10-CM | POA: Diagnosis not present

## 2021-11-03 DIAGNOSIS — Z5112 Encounter for antineoplastic immunotherapy: Secondary | ICD-10-CM | POA: Diagnosis not present

## 2021-11-03 DIAGNOSIS — C3490 Malignant neoplasm of unspecified part of unspecified bronchus or lung: Secondary | ICD-10-CM

## 2021-11-03 DIAGNOSIS — C3432 Malignant neoplasm of lower lobe, left bronchus or lung: Secondary | ICD-10-CM

## 2021-11-03 DIAGNOSIS — E785 Hyperlipidemia, unspecified: Secondary | ICD-10-CM | POA: Diagnosis not present

## 2021-11-03 DIAGNOSIS — Z79899 Other long term (current) drug therapy: Secondary | ICD-10-CM | POA: Diagnosis not present

## 2021-11-03 LAB — CBC WITH DIFFERENTIAL (CANCER CENTER ONLY)
Abs Immature Granulocytes: 0.01 10*3/uL (ref 0.00–0.07)
Basophils Absolute: 0 10*3/uL (ref 0.0–0.1)
Basophils Relative: 0 %
Eosinophils Absolute: 0.2 10*3/uL (ref 0.0–0.5)
Eosinophils Relative: 3 %
HCT: 30.5 % — ABNORMAL LOW (ref 39.0–52.0)
Hemoglobin: 10.3 g/dL — ABNORMAL LOW (ref 13.0–17.0)
Immature Granulocytes: 0 %
Lymphocytes Relative: 22 %
Lymphs Abs: 1.2 10*3/uL (ref 0.7–4.0)
MCH: 31.7 pg (ref 26.0–34.0)
MCHC: 33.8 g/dL (ref 30.0–36.0)
MCV: 93.8 fL (ref 80.0–100.0)
Monocytes Absolute: 0.9 10*3/uL (ref 0.1–1.0)
Monocytes Relative: 17 %
Neutro Abs: 3.2 10*3/uL (ref 1.7–7.7)
Neutrophils Relative %: 58 %
Platelet Count: 196 10*3/uL (ref 150–400)
RBC: 3.25 MIL/uL — ABNORMAL LOW (ref 4.22–5.81)
RDW: 12.7 % (ref 11.5–15.5)
WBC Count: 5.5 10*3/uL (ref 4.0–10.5)
nRBC: 0 % (ref 0.0–0.2)

## 2021-11-03 LAB — TSH: TSH: 0.694 u[IU]/mL (ref 0.350–4.500)

## 2021-11-03 LAB — CMP (CANCER CENTER ONLY)
ALT: 15 U/L (ref 0–44)
AST: 20 U/L (ref 15–41)
Albumin: 3.5 g/dL (ref 3.5–5.0)
Alkaline Phosphatase: 52 U/L (ref 38–126)
Anion gap: 5 (ref 5–15)
BUN: 21 mg/dL (ref 8–23)
CO2: 26 mmol/L (ref 22–32)
Calcium: 8.4 mg/dL — ABNORMAL LOW (ref 8.9–10.3)
Chloride: 105 mmol/L (ref 98–111)
Creatinine: 1.39 mg/dL — ABNORMAL HIGH (ref 0.61–1.24)
GFR, Estimated: 50 mL/min — ABNORMAL LOW (ref >60)
Glucose, Bld: 102 mg/dL — ABNORMAL HIGH (ref 70–99)
Potassium: 3.7 mmol/L (ref 3.5–5.1)
Sodium: 136 mmol/L (ref 135–145)
Total Bilirubin: 0.7 mg/dL (ref 0.3–1.2)
Total Protein: 7.8 g/dL (ref 6.5–8.1)

## 2021-11-03 MED ORDER — SODIUM CHLORIDE 0.9% FLUSH
10.0000 mL | Freq: Once | INTRAVENOUS | Status: AC
  Administered 2021-11-03: 10 mL

## 2021-11-03 MED ORDER — HEPARIN SOD (PORK) LOCK FLUSH 100 UNIT/ML IV SOLN
500.0000 [IU] | Freq: Once | INTRAVENOUS | Status: AC | PRN
  Administered 2021-11-03: 500 [IU]

## 2021-11-03 MED ORDER — SODIUM CHLORIDE 0.9 % IV SOLN
360.0000 mg | Freq: Once | INTRAVENOUS | Status: AC
  Administered 2021-11-03: 360 mg via INTRAVENOUS
  Filled 2021-11-03: qty 24

## 2021-11-03 MED ORDER — SODIUM CHLORIDE 0.9 % IV SOLN: Freq: Once | INTRAVENOUS | Status: AC

## 2021-11-03 MED ORDER — TRUEPLUS LANCETS 33G MISC: 1.0000 | Freq: Two times a day (BID) | 4 refills | Status: DC

## 2021-11-03 MED ORDER — SODIUM CHLORIDE 0.9% FLUSH
10.0000 mL | INTRAVENOUS | Status: DC | PRN
  Administered 2021-11-03: 10 mL

## 2021-11-03 NOTE — Telephone Encounter (Signed)
Appt -pt forgot. Schedule message sent.

## 2021-11-03 NOTE — Telephone Encounter (Signed)
Called pt wife to advise that pt is to check his blood sugars bid and to call office if he needs help with meter. Lancets were put in for center well. Gregory

## 2021-11-03 NOTE — Telephone Encounter (Signed)
-----   Message from [name], MD sent at 11/03/2021 12:53 PM EDT ----- Regarding: FW: FSBG Work with him and have him check his sugars once a day.  Make sure he has a follow-up visit ----- Message ----- From: Ardeen Garland, RN Sent: 11/03/2021  11:10 AM EDT To: [name], MD Subject: FSBG                                           Does he need to continue fingerstick blood glucose checks.?    If so he needs lancets to prick his finger and how often, Please have nurse f/u with his wife.  In may his glucose was 171-since then glucose has been good  [employer]

## 2021-11-03 NOTE — Patient Instructions (Signed)
[facility]  Discharge Instructions: Thank you for choosing [facility] to provide your oncology and hematology care.   If you have a lab appointment with the [facility], please go directly to the [facility] and check in at the registration area.   Wear comfortable clothing and clothing appropriate for easy access to any Portacath or PICC line.   We strive to give you quality time with your provider. You may need to reschedule your appointment if you arrive late (15 or more minutes).  Arriving late affects you and other patients whose appointments are after yours.  Also, if you miss three or more appointments without notifying the office, you may be dismissed from the clinic at the provider's discretion.      For prescription refill requests, have your pharmacy contact our office and allow 72 hours for refills to be completed.    Today you received the following chemotherapy and/or immunotherapy agents opdivo      To help prevent nausea and vomiting after your treatment, we encourage you to take your nausea medication as directed.  BELOW ARE SYMPTOMS THAT SHOULD BE REPORTED IMMEDIATELY: *FEVER GREATER THAN 100.4 F (38 C) OR HIGHER *CHILLS OR SWEATING *NAUSEA AND VOMITING THAT IS NOT CONTROLLED WITH YOUR NAUSEA MEDICATION *UNUSUAL SHORTNESS OF BREATH *UNUSUAL BRUISING OR BLEEDING *URINARY PROBLEMS (pain or burning when urinating, or frequent urination) *BOWEL PROBLEMS (unusual diarrhea, constipation, pain near the anus) TENDERNESS IN MOUTH AND THROAT WITH OR WITHOUT PRESENCE OF ULCERS (sore throat, sores in mouth, or a toothache) UNUSUAL RASH, SWELLING OR PAIN  UNUSUAL VAGINAL DISCHARGE OR ITCHING   Items with * indicate a potential emergency and should be followed up as soon as possible or go to the Emergency Department if any problems should occur.  Please show the CHEMOTHERAPY ALERT CARD or IMMUNOTHERAPY ALERT CARD at check-in to the  Emergency Department and triage nurse.  Should you have questions after your visit or need to cancel or reschedule your appointment, please contact [facility]  Dept: [phone]  and follow the prompts.  Office hours are 8:00 a.m. to 4:30 p.m. Monday - Friday. Please note that voicemails left after 4:00 p.m. may not be returned until the following business day.  We are closed weekends and major holidays. You have access to a nurse at all times for urgent questions. Please call the main number to the clinic Dept: [phone] and follow the prompts.   For any non-urgent questions, you may also contact your provider using MyChart. We now offer e-Visits for anyone 39 and older to request care online for non-urgent symptoms. For details visit [email or web address].   Also download the MyChart app! Go to the app store, search "MyChart", open the app, select [facility], and log in with your MyChart username and password.  Masks are optional in the cancer centers. If you would like for your care team to wear a mask while they are taking care of you, please let them know. You may have one support person who is at least 83 years old accompany you for your appointments.

## 2021-11-03 NOTE — Patient Instructions (Signed)

## 2021-11-03 NOTE — Progress Notes (Signed)
[place] [phone]   [phone]  OFFICE PROGRESS NOTE  Shaun Kennedy, [place] [address]  DIAGNOSIS: stage IV (T3, N2, M1 a) non-small cell Kennedy cancer, squamous cell carcinoma presented with obstructive left lower lobe Kennedy mass in addition to mediastinal lymphadenopathy as well as bilateral pulmonary nodules diagnosed in July 2021.   PDL1: 0%   PRIOR THERAPY: None   CURRENT THERAPY: 1) Palliative radiotherapy to the obstructive left lower lobe Kennedy mass under the care of Dr. Lisbeth Renshaw.  2)  systemic chemotherapy 2 cycles of chemotherapy with carboplatin for an AUC of 5 and paclitaxel 175 mg/m2 in addition to immunotherapy with nivolumab 360 mg every 3 weeks and ipilimumab 1 mg/kg IV every 6 weeks followed by maintenance nivolumab and ipilimumab.  He started the first treatment on 09/04/2019.  He is status post 18 cycles.  He is here today for day 22 of cycle #19  INTERVAL HISTORY: Shaun Kennedy 81 y.o. male returns to the clinic today for follow-up visit.  The patient is feeling fine today with no concerning complaints except for mild cough.  He has no current chest pain, shortness of breath or hemoptysis.  He has no nausea, vomiting, diarrhea or constipation.  He has no headache or visual changes.  He denied having any weight loss or night sweats.  He has been tolerating his treatment with immunotherapy fairly well.  He is here today for evaluation before starting day 22 of cycle #19.  MEDICAL HISTORY: Past Medical History:  Diagnosis Date   Allergic rhinitis    Asthma    Carotid stenosis    Colon cancer ([place]) 2003   Diabetes mellitus ([place])    Diverticulosis    Dyslipidemia    ED (erectile dysfunction)    GERD (gastroesophageal reflux disease)    H/O degenerative disc disease    Hemorrhoids    HTN (hypertension)    as a child   Hyperlipidemia    Kennedy cancer ([place])    LVH (left ventricular hypertrophy)     on EKG   Mass of lower lobe of left Kennedy    Mediastinal adenopathy    Smoker    former   Wears dentures    Wears dentures    Wears glasses    Wears glasses     ALLERGIES:  has No Known Allergies.  MEDICATIONS:  Current Outpatient Medications  Medication Sig Dispense Refill   Alcohol Swabs (DROPSAFE ALCOHOL PREP) 70 % PADS Apply topically.     amLODipine (NORVASC) 5 MG tablet TAKE 1 TABLET EVERY DAY 90 tablet 3   aspirin EC 81 MG tablet Take 81 mg by mouth daily after breakfast.      betamethasone valerate ointment (VALISONE) 0.1 % APPLY 1 APPLICATION TOPICALLY 2 TIMES DAILY 45 g 0   bisacodyl (DULCOLAX) 5 MG EC tablet Take 10 mg by mouth daily as needed for moderate constipation.     Blood Glucose Calibration (TRUE METRIX LEVEL 1) Low SOLN      Borage, Borago officinalis, (BORAGE OIL PO) Take 2,300 mg by mouth.     cholecalciferol (VITAMIN D) 25 MCG (1000 UNIT) tablet Take 1,000 Units by mouth daily.     donepezil (ARICEPT) 5 MG tablet Take 1 tablet (5 mg total) by mouth at bedtime. 90 tablet 0   dronabinol (MARINOL) 2.5 MG capsule Take 1 capsule (2.5 mg total) by mouth 2 (two) times daily before a meal.  60 capsule 1   ferrous sulfate 325 (65 FE) MG EC tablet TAKE 1 TABLET (325 MG TOTAL) DAILY WITH BREAKFAST. 90 tablet 1   gabapentin (NEURONTIN) 100 MG capsule Take by mouth.     hydrOXYzine (ATARAX) 10 MG tablet Take 1 tablet (10 mg total) by mouth 3 (three) times daily as needed. 90 tablet 1   Lancet Devices (TRUEDRAW LANCING DEVICE) MISC Check sugars daily 90 each 0   linaclotide (LINZESS) 72 MCG capsule Take by mouth.     lisinopril-hydrochlorothiazide (ZESTORETIC) 20-12.5 MG tablet TAKE 1 TABLET EVERY DAY 90 tablet 1   metFORMIN (GLUCOPHAGE-XR) 500 MG 24 hr tablet TAKE 1 TABLET EVERY DAY 90 tablet 0   Multiple Vitamin (MULTIVITAMIN WITH MINERALS) TABS tablet Take 1 tablet by mouth daily after breakfast.      simvastatin (ZOCOR) 40 MG tablet TAKE 1 TABLET EVERY DAY 90 tablet 1    solifenacin (VESICARE) 5 MG tablet TAKE 1 TABLET EVERY DAY 90 tablet 1   TRUE METRIX BLOOD GLUCOSE TEST test strip SMARTSIG:Via Meter     TRUEplus Lancets 33G MISC 1 each by Does not apply route 2 (two) times daily. 200 each 4   No current facility-administered medications for this visit.    SURGICAL HISTORY:  Past Surgical History:  Procedure Laterality Date   BRONCHIAL BIOPSY  08/20/2019   Procedure: BRONCHIAL BIOPSIES;  Surgeon: Collene Gobble, MD;  Location: [facility] ENDOSCOPY;  Service: Pulmonary;;   BRONCHIAL BRUSHINGS  08/20/2019   Procedure: BRONCHIAL BRUSHINGS;  Surgeon: Collene Gobble, MD;  Location: [facility] ENDOSCOPY;  Service: Pulmonary;;   BRONCHIAL NEEDLE ASPIRATION BIOPSY  08/20/2019   Procedure: BRONCHIAL NEEDLE ASPIRATION BIOPSIES;  Surgeon: Collene Gobble, MD;  Location: [facility] ENDOSCOPY;  Service: Pulmonary;;   CARPAL TUNNEL RELEASE Right 01/09/2019   Procedure: CARPAL TUNNEL RELEASE;  Surgeon: Leandrew Koyanagi, MD;  Location: [place];  Service: Orthopedics;  Laterality: Right;   CARPAL TUNNEL WITH CUBITAL TUNNEL Right 11/18/2020   Procedure: CARPAL TUNNEL WITH CUBITAL TUNNEL;  Surgeon: Charlotte Crumb, MD;  Location: [place];  Service: Orthopedics;  Laterality: Right;   CATARACT EXTRACTION Right 2018   COLONOSCOPY  2007   Dr. Benson [place]   DIRECT LARYNGOSCOPY N/A 04/10/2020   Procedure: DIRECT LARYNGOSCOPY;  Surgeon: Melida Quitter, MD;  Location: [place];  Service: ENT;  Laterality: N/A;   I & D EXTREMITY Right 04/02/2016   Procedure: IRRIGATION AND DEBRIDEMENT GREAT TOE;  Surgeon: Leandrew Koyanagi, MD;  Location: [place];  Service: Orthopedics;  Laterality: Right;   IR IMAGING GUIDED PORT INSERTION  08/30/2019   MULTIPLE TOOTH EXTRACTIONS     RADIOACTIVE SEED IMPLANT  2003   TRACHEOSTOMY TUBE PLACEMENT N/A 04/10/2020   Procedure: TRACHEOSTOMY;  Surgeon: Melida Quitter, MD;  Location: [place];  Service: ENT;  Laterality: N/A;   ULNAR TUNNEL RELEASE Right 01/09/2019   Procedure: RIGHT  CUBITAL TUNNEL RELEASE AND CARPAL TUNNEL RELEASE;  Surgeon: Leandrew Koyanagi, MD;  Location: [place];  Service: Orthopedics;  Laterality: Right;   UPPER GASTROINTESTINAL ENDOSCOPY     VIDEO BRONCHOSCOPY N/A 12/18/2019   Procedure: VIDEO BRONCHOSCOPY WITHOUT FLUORO;  Surgeon: Collene Gobble, MD;  Location: [facility] ENDOSCOPY;  Service: Cardiopulmonary;  Laterality: N/A;   VIDEO BRONCHOSCOPY WITH ENDOBRONCHIAL ULTRASOUND N/A 08/20/2019   Procedure: VIDEO BRONCHOSCOPY WITH ENDOBRONCHIAL ULTRASOUND;  Surgeon: Collene Gobble, MD;  Location: [facility] ENDOSCOPY;  Service: Pulmonary;  Laterality: N/A;    REVIEW OF SYSTEMS:  A  comprehensive review of systems was negative except for: Respiratory: positive for cough   PHYSICAL EXAMINATION: General appearance: alert, cooperative, and no distress Head: Normocephalic, without obvious abnormality, atraumatic Neck: no adenopathy, no JVD, supple, symmetrical, trachea midline, and thyroid not enlarged, symmetric, no tenderness/mass/nodules Lymph nodes: Cervical, supraclavicular, and axillary nodes normal. Resp: clear to auscultation bilaterally Back: symmetric, no curvature. ROM normal. No CVA tenderness. Cardio: regular rate and rhythm, S1, S2 normal, no murmur, click, rub or gallop GI: soft, non-tender; bowel sounds normal; no masses,  no organomegaly Extremities: extremities normal, atraumatic, no cyanosis or edema  ECOG PERFORMANCE STATUS: 1 - Symptomatic but completely ambulatory  Blood pressure (!) 141/64, pulse 78, temperature 99.2 F (37.3 C), temperature source Oral, resp. rate 16, weight 189 lb 9.6 oz (86 kg), SpO2 94 %.  LABORATORY DATA: Lab Results  Component Value Date   WBC 5.5 11/03/2021   HGB 10.3 (L) 11/03/2021   HCT 30.5 (L) 11/03/2021   MCV 93.8 11/03/2021   PLT 196 11/03/2021      Chemistry      Component Value Date/Time   NA 136 11/03/2021 1023   NA 146 (H) 10/04/2021 1616   K 3.7 11/03/2021 1023   CL 105 11/03/2021  1023   CO2 26 11/03/2021 1023   BUN 21 11/03/2021 1023   BUN 12 10/04/2021 1616   CREATININE 1.39 (H) 11/03/2021 1023   CREATININE 1.25 (H) 08/08/2016 0912      Component Value Date/Time   CALCIUM 8.4 (L) 11/03/2021 1023   ALKPHOS 52 11/03/2021 1023   AST 20 11/03/2021 1023   ALT 15 11/03/2021 1023   BILITOT 0.7 11/03/2021 1023       RADIOGRAPHIC STUDIES: VAS [place] LOWER EXTREMITY VENOUS REFLUX  Result Date: 11/01/2021  Lower Venous Reflux Study Patient Name:  MYKAH SHIN  Date of Exam:   11/01/2021 Medical Rec #: [id number]       Accession #:    [id number] Date of Birth: 02/27/1940       Patient Gender: M Patient Age:   55 years Exam Location:  [facility] Procedure:      VAS [place] LOWER EXTREMITY VENOUS REFLUX Referring Phys: Harold Barban --------------------------------------------------------------------------------  Indications: Venous stasis. Patient reports no complaints with his legs.  Performing Technologist: Ronal Fear RVS, RCS  Examination Guidelines: A complete evaluation includes B-mode imaging, spectral Doppler, color Doppler, and power Doppler as needed of all accessible portions of each vessel. Bilateral testing is considered an integral part of a complete examination. Limited examinations for reoccurring indications may be performed as noted. The reflux portion of the exam is performed with the patient in reverse Trendelenburg. Significant venous reflux is defined as >500 ms in the superficial venous system, and >1 second in the deep venous system.  Venous Reflux Times +------------------+---------+------+-----------+------------+----------------+ RIGHT             Reflux NoRefluxReflux TimeDiameter cmsComments                                     Yes                                          +------------------+---------+------+-----------+------------+----------------+ CFV               no                                                      +------------------+---------+------+-----------+------------+----------------+  FV mid            no                                                     +------------------+---------+------+-----------+------------+----------------+ GSV at [facility]        no                            0.37                     +------------------+---------+------+-----------+------------+----------------+ GSV prox thigh    no                            0.26                     +------------------+---------+------+-----------+------------+----------------+ GSV mid thigh     no                            0.28                     +------------------+---------+------+-----------+------------+----------------+ GSV dist thigh    no                            0.30                     +------------------+---------+------+-----------+------------+----------------+ GSV at knee       no                            0.21                     +------------------+---------+------+-----------+------------+----------------+ SSV Pop Fossa     no                            0.56    chronic thrombus +------------------+---------+------+-----------+------------+----------------+ anterior accessory          yes    >500 ms      0.46                     +------------------+---------+------+-----------+------------+----------------+   Summary: Right: - No evidence of deep vein thrombosis from the common femoral through the popliteal veins  - Chronic superficial thrombus involving the small saphenous vein. - The deep venous system is competent. - The great saphenous vein is competent. - The small saphenous vein is competent.  - Of note: Heavily diseased superficial femoral artery with mid segment velocities suggestive of a 50-74% stenosis.  *See table(s) above for measurements and observations. Electronically signed by Harold Barban MD on 11/01/2021 at 4:25:46 PM.    Final     ASSESSMENT AND PLAN: This is a  very pleasant 81 years old [name] with stage IV non-small cell Kennedy cancer, squamous cell carcinoma with negative PD-L1 expression. The patient is currently undergoing systemic chemotherapy with carboplatin and paclitaxel for 2 cycles in addition to immunotherapy with ipilimumab 1 mg/KG every 6 weeks and nivolumab 360 mg IV every 3 weeks status post 18  cycles. He is currently undergoing cycle #19 and tolerating the treatment well. He has no concerning complaints today. I recommended for him to proceed with day 22 of cycle #19 as planned. I will see him back for follow-up visit in 3 weeks for evaluation with repeat CT scan of the chest, abdomen and pelvis for restaging of his disease. The patient was advised to call immediately if he has any other concerning symptoms in the interval. The patient voices understanding of current disease status and treatment options and is in agreement with the current care plan.  All questions were answered. The patient knows to call the clinic with any problems, questions or concerns. We can certainly see the patient much sooner if necessary.  Disclaimer: This note was dictated with voice recognition software. Similar sounding words can inadvertently be transcribed and may not be corrected upon review.

## 2021-11-04 ENCOUNTER — Telehealth: Payer: Self-pay | Admitting: Licensed Clinical Social Worker

## 2021-11-04 ENCOUNTER — Encounter: Payer: Self-pay | Admitting: Licensed Clinical Social Worker

## 2021-11-04 LAB — T4: T4, Total: 6 ug/dL (ref 4.5–12.0)

## 2021-11-04 NOTE — Patient Outreach (Signed)
Care Coordination   11/04/2021 Name: Shaun Kennedy MRN: [id number] DOB: Aug 14, 1940   Care Coordination Outreach Attempts: An unsuccessful scheduled telephone outreach was attempted today. LCSW left a message requesting a return call  Follow Up Plan:  LCSW will wait for a return call. Additional outreach attempts will be made to offer the patient care coordination information and services.   Encounter Outcome:  No Answer  Care Coordination Interventions Activated:  No   Care Coordination Interventions:  No, not indicated    Jenel Lucks, MSW, LCSW [facility] Care Management [facility]  [facility] [email or web address] Phone [phone] 11:04 AM

## 2021-11-09 ENCOUNTER — Other Ambulatory Visit: Payer: Self-pay | Admitting: Family Medicine

## 2021-11-10 ENCOUNTER — Other Ambulatory Visit: Payer: Self-pay | Admitting: Family Medicine

## 2021-11-10 DIAGNOSIS — R531 Weakness: Secondary | ICD-10-CM

## 2021-11-16 ENCOUNTER — Encounter: Payer: Self-pay | Admitting: Internal Medicine

## 2021-11-18 ENCOUNTER — Encounter (HOSPITAL_COMMUNITY): Payer: Self-pay

## 2021-11-18 ENCOUNTER — Ambulatory Visit (HOSPITAL_COMMUNITY)
Admission: RE | Admit: 2021-11-18 | Discharge: 2021-11-18 | Disposition: A | Discharge status: Home / Self Care | Payer: Medicare HMO | Source: Ambulatory Visit | Attending: Internal Medicine | Admitting: Internal Medicine

## 2021-11-18 DIAGNOSIS — K5730 Diverticulosis of large intestine without perforation or abscess without bleeding: Secondary | ICD-10-CM | POA: Diagnosis not present

## 2021-11-18 DIAGNOSIS — C3490 Malignant neoplasm of unspecified part of unspecified bronchus or lung: Secondary | ICD-10-CM | POA: Diagnosis not present

## 2021-11-18 DIAGNOSIS — R911 Solitary pulmonary nodule: Secondary | ICD-10-CM | POA: Diagnosis not present

## 2021-11-18 DIAGNOSIS — N3289 Other specified disorders of bladder: Secondary | ICD-10-CM | POA: Diagnosis not present

## 2021-11-18 MED ORDER — SODIUM CHLORIDE (PF) 0.9 % IJ SOLN
INTRAMUSCULAR | Status: AC
  Filled 2021-11-18: qty 50

## 2021-11-18 MED ORDER — IOHEXOL 300 MG/ML  SOLN
100.0000 mL | Freq: Once | INTRAMUSCULAR | Status: AC | PRN
  Administered 2021-11-18: 100 mL via INTRAVENOUS

## 2021-11-18 MED ORDER — HEPARIN SOD (PORK) LOCK FLUSH 100 UNIT/ML IV SOLN
500.0000 [IU] | Freq: Once | INTRAVENOUS | Status: AC
  Administered 2021-11-18: 500 [IU] via INTRAVENOUS

## 2021-11-18 MED ORDER — HEPARIN SOD (PORK) LOCK FLUSH 100 UNIT/ML IV SOLN
INTRAVENOUS | Status: AC
  Filled 2021-11-18: qty 5

## 2021-11-22 NOTE — Progress Notes (Unsigned)
[facility] OFFICE PROGRESS NOTE  [name], [place] [address]  DIAGNOSIS: Stage IV (T3, N2, M1 a) non-small cell lung cancer, squamous cell carcinoma presented with obstructive left lower lobe lung mass in addition to mediastinal lymphadenopathy as well as bilateral pulmonary nodules diagnosed in July 2021.   PDL1: 0%  PRIOR THERAPY: Palliative radiotherapy to the obstructive left lower lobe lung mass under the care of Dr. Lisbeth Renshaw. Completed on 09/20/19  CURRENT THERAPY:  Systemic chemotherapy 2 cycles of chemotherapy with carboplatin for an AUC of 5 and paclitaxel 175 mg/m2 in addition to immunotherapy with nivolumab 360 mg every 3 weeks and ipilimumab 1 mg/kg IV every 6 weeks followed by maintenance nivolumab and ipilimumab.  He started the first treatment on 09/04/2019.  He is status post 19 cycles of treatment.   INTERVAL HISTORY: Shaun Kennedy 81 y.o. male returns to the clinic today for a follow up visit. The patient is feeling well today without any concerning complaints. He follows closely with ENT for his tracheostomy.  The patient continues to tolerate treatment with immunotherapy well without any adverse side effects. He is feeling well today. Denies any fever, chills, night sweats. He reprots breathing well, he denies any cough or hemoptysis. He denies any shortness of breath.  Denies any nausea, vomiting, blood in the stool, constipation, or diarrhea, or abdominal pain. He does not see a Copywriter, advertising. Per chart review, he saw Dr. Hilarie Fredrickson in the past for upper endoscopy and colonoscopy. Denies any headache or visual changes.  Denies any rashes or skin changes.  The patient recently had a restaging CT performed.  The patient is here today for evaluation and to review his scan before starting cycle #20.     MEDICAL HISTORY: Past Medical History:  Diagnosis Date   Allergic rhinitis    Asthma    Carotid stenosis    Colon cancer  ([place]) 2003   Diabetes mellitus ([facility])    Diverticulosis    Dyslipidemia    ED (erectile dysfunction)    GERD (gastroesophageal reflux disease)    H/O degenerative disc disease    Hemorrhoids    HTN (hypertension)    as a child   Hyperlipidemia    Lung cancer ([place])    LVH (left ventricular hypertrophy)    on EKG   Mass of lower lobe of left lung    Mediastinal adenopathy    Smoker    former   Wears dentures    Wears dentures    Wears glasses    Wears glasses     ALLERGIES:  has No Known Allergies.  MEDICATIONS:  Current Outpatient Medications  Medication Sig Dispense Refill   Alcohol Swabs (DROPSAFE ALCOHOL PREP) 70 % PADS Apply topically.     amLODipine (NORVASC) 5 MG tablet TAKE 1 TABLET EVERY DAY 90 tablet 3   aspirin EC 81 MG tablet Take 81 mg by mouth daily after breakfast.      betamethasone valerate ointment (VALISONE) 0.1 % APPLY 1 APPLICATION TOPICALLY 2 TIMES DAILY 45 g 0   bisacodyl (DULCOLAX) 5 MG EC tablet Take 10 mg by mouth daily as needed for moderate constipation.     Blood Glucose Calibration (TRUE METRIX LEVEL 1) Low SOLN      Blood Glucose Monitoring Suppl (TRUE METRIX AIR GLUCOSE METER) w/Device KIT USE AS DIRECTED 1 kit 0   Borage, Borago officinalis, (BORAGE OIL PO) Take 2,300 mg by mouth.  cholecalciferol (VITAMIN D) 25 MCG (1000 UNIT) tablet Take 1,000 Units by mouth daily.     donepezil (ARICEPT) 5 MG tablet Take 1 tablet (5 mg total) by mouth at bedtime. 90 tablet 0   dronabinol (MARINOL) 2.5 MG capsule Take 1 capsule (2.5 mg total) by mouth 2 (two) times daily before a meal. 60 capsule 1   ferrous sulfate 325 (65 FE) MG EC tablet TAKE 1 TABLET (325 MG TOTAL) DAILY WITH BREAKFAST. 90 tablet 1   gabapentin (NEURONTIN) 100 MG capsule Take by mouth.     hydrOXYzine (ATARAX) 10 MG tablet Take 1 tablet (10 mg total) by mouth 3 (three) times daily as needed. 90 tablet 1   Lancet Devices (TRUEDRAW LANCING DEVICE) MISC Check sugars daily 90 each 0    linaclotide (LINZESS) 72 MCG capsule Take by mouth.     lisinopril-hydrochlorothiazide (ZESTORETIC) 20-12.5 MG tablet TAKE 1 TABLET EVERY DAY 90 tablet 1   metFORMIN (GLUCOPHAGE-XR) 500 MG 24 hr tablet TAKE 1 TABLET EVERY DAY 90 tablet 0   Multiple Vitamin (MULTIVITAMIN WITH MINERALS) TABS tablet Take 1 tablet by mouth daily after breakfast.      simvastatin (ZOCOR) 40 MG tablet TAKE 1 TABLET EVERY DAY 90 tablet 1   solifenacin (VESICARE) 5 MG tablet TAKE 1 TABLET EVERY DAY 90 tablet 1   TRUE METRIX BLOOD GLUCOSE TEST test strip TEST BLOOD GLUCOSE  UP TO TWO TIMES DAILY 200 strip 2   TRUEplus Lancets 33G MISC 1 each by Does not apply route 2 (two) times daily. 200 each 4   No current facility-administered medications for this visit.    SURGICAL HISTORY:  Past Surgical History:  Procedure Laterality Date   BRONCHIAL BIOPSY  08/20/2019   Procedure: BRONCHIAL BIOPSIES;  Surgeon: Collene Gobble, MD;  Location[facility] ENDOSCOPY;  Service: Pulmonary;;   BRONCHIAL BRUSHINGS  08/20/2019   Procedure: BRONCHIAL BRUSHINGS;  Surgeon: Collene Gobble, MD;  Location: [facility] ENDOSCOPY;  Service: Pulmonary;;   BRONCHIAL NEEDLE ASPIRATION BIOPSY  08/20/2019   Procedure: BRONCHIAL NEEDLE ASPIRATION BIOPSIES;  Surgeon: Collene Gobble, MD;  Location: [facility] ENDOSCOPY;  Service: Pulmonary;;   CARPAL TUNNEL RELEASE Right 01/09/2019   Procedure: CARPAL TUNNEL RELEASE;  Surgeon: Leandrew Koyanagi, MD;  Location: [facility];  Service: Orthopedics;  Laterality: Right;   CARPAL TUNNEL WITH CUBITAL TUNNEL Right 11/18/2020   Procedure: CARPAL TUNNEL WITH CUBITAL TUNNEL;  Surgeon: Charlotte Crumb, MD;  Location: [facility];  Service: Orthopedics;  Laterality: Right;   CATARACT EXTRACTION Right 2018   COLONOSCOPY  2007   Dr. Benson Norway   DIRECT LARYNGOSCOPY N/A 04/10/2020   Procedure: DIRECT LARYNGOSCOPY;  Surgeon: Melida Quitter, MD;  Location: [facility];  Service: ENT;  Laterality: N/A;   I & D EXTREMITY Right 04/02/2016   Procedure:  IRRIGATION AND DEBRIDEMENT GREAT TOE;  Surgeon: Leandrew Koyanagi, MD;  Location: [facility];  Service: Orthopedics;  Laterality: Right;   IR IMAGING GUIDED PORT INSERTION  08/30/2019   MULTIPLE TOOTH EXTRACTIONS     RADIOACTIVE SEED IMPLANT  2003   TRACHEOSTOMY TUBE PLACEMENT N/A 04/10/2020   Procedure: TRACHEOSTOMY;  Surgeon: Melida Quitter, MD;  Location: [facility];  Service: ENT;  Laterality: N/A;   ULNAR TUNNEL RELEASE Right 01/09/2019   Procedure: RIGHT CUBITAL TUNNEL RELEASE AND CARPAL TUNNEL RELEASE;  Surgeon: Leandrew Koyanagi, MD;  Location: [facility];  Service: Orthopedics;  Laterality: Right;   UPPER GASTROINTESTINAL ENDOSCOPY     VIDEO BRONCHOSCOPY  N/A 12/18/2019   Procedure: VIDEO BRONCHOSCOPY WITHOUT FLUORO;  Surgeon: Collene Gobble, MD;  Location: [facility] ENDOSCOPY;  Service: Cardiopulmonary;  Laterality: N/A;   VIDEO BRONCHOSCOPY WITH ENDOBRONCHIAL ULTRASOUND N/A 08/20/2019   Procedure: VIDEO BRONCHOSCOPY WITH ENDOBRONCHIAL ULTRASOUND;  Surgeon: Collene Gobble, MD;  Location: [facility] ENDOSCOPY;  Service: Pulmonary;  Laterality: N/A;    REVIEW OF SYSTEMS:   Review of Systems  Constitutional: Negative for appetite change, chills, fatigue, fever and unexpected weight change.  HENT: Negative for mouth sores, nosebleeds, sore throat and trouble swallowing.   Eyes: Negative for eye problems and icterus.  Respiratory: Positive for occasional cough depending on mucus blocking his trach. Positive for mild dyspnea with certain activities. Negative for hemoptysis and wheezing.   Cardiovascular: Negative for chest pain and leg swelling.  Gastrointestinal: Negative for abdominal pain, constipation, diarrhea, nausea and vomiting.  Genitourinary: Negative for bladder incontinence, difficulty urinating, dysuria, frequency and hematuria.   Musculoskeletal: Negative for back pain, gait problem, neck pain and neck stiffness.  Skin: Negative for itching and rash.  Neurological: Negative for dizziness,  extremity weakness, gait problem, headaches, light-headedness and seizures.  Hematological: Negative for adenopathy. Does not bruise/bleed easily.  Psychiatric/Behavioral: Negative for confusion, depression and sleep disturbance. The patient is not nervous/anxious.     PHYSICAL EXAMINATION:  Blood pressure 138/62, pulse 60, temperature 98.3 F (36.8 C), temperature source Tympanic, resp. rate 18, height '5\' 7"'  (1.702 m), weight 185 lb 3.2 oz (84 kg), SpO2 100 %.  ECOG PERFORMANCE STATUS: 1  Physical Exam  Constitutional: Oriented to person, place, and time and well-developed, well-nourished, and in no distress. No distress.  HENT:  Head: Normocephalic and atraumatic.  Mouth/Throat: Trach used  Eyes: Conjunctivae are normal. Right eye exhibits no discharge. Left eye exhibits no discharge. No scleral icterus.  Neck: Normal range of motion. Neck supple.  Cardiovascular: Normal rate, regular rhythm, normal heart sounds and intact distal pulses.   Pulmonary/Chest: Effort normal and breath sounds normal. No respiratory distress. No wheezes. No rales. Positive for rhonchi.  Abdominal: Soft.  Exhibits no distension.There is no tenderness.  Musculoskeletal: Normal range of motion.  Lymphadenopathy:    No cervical adenopathy.  Neurological: Alert and oriented to person, place, and time. Exhibits normal muscle tone. Gait normal. Coordination normal.  Skin: Skin is warm and dry. No rash noted. Not diaphoretic. No erythema. No pallor.  Psychiatric: Mood, memory and judgment normal.  Vitals reviewed.  LABORATORY DATA: Lab Results  Component Value Date   WBC 4.8 11/24/2021   HGB 11.1 (L) 11/24/2021   HCT 32.8 (L) 11/24/2021   MCV 92.4 11/24/2021   PLT 269 11/24/2021      Chemistry      Component Value Date/Time   NA 137 11/24/2021 1002   NA 146 (H) 10/04/2021 1616   K 4.0 11/24/2021 1002   CL 104 11/24/2021 1002   CO2 28 11/24/2021 1002   BUN 19 11/24/2021 1002   BUN 12 10/04/2021  1616   CREATININE 1.27 (H) 11/24/2021 1002   CREATININE 1.25 (H) 08/08/2016 0912      Component Value Date/Time   CALCIUM 9.0 11/24/2021 1002   ALKPHOS 59 11/24/2021 1002   AST 16 11/24/2021 1002   ALT 11 11/24/2021 1002   BILITOT 0.8 11/24/2021 1002       RADIOGRAPHIC STUDIES:  CT Chest W Contrast  Result Date: 11/20/2021 CLINICAL DATA:  81 year old male with history of non-small cell lung cancer. Follow-up study. * Tracking Code: BO *  EXAM: CT CHEST, ABDOMEN, AND PELVIS WITH CONTRAST TECHNIQUE: Multidetector CT imaging of the chest, abdomen and pelvis was performed following the standard protocol during bolus administration of intravenous contrast. RADIATION DOSE REDUCTION: This exam was performed according to the departmental dose-optimization program which includes automated exposure control, adjustment of the mA and/or kV according to patient size and/or use of iterative reconstruction technique. CONTRAST:  168m OMNIPAQUE IOHEXOL 300 MG/ML  SOLN COMPARISON:  CT of the chest, abdomen and pelvis 08/31/2021. FINDINGS: CT CHEST FINDINGS Cardiovascular: Heart size is normal. There is no significant pericardial fluid, thickening or pericardial calcification. There is aortic atherosclerosis, as well as atherosclerosis of the great vessels of the mediastinum and the coronary arteries, including calcified atherosclerotic plaque in the left main, left anterior descending, left circumflex and right coronary arteries. Right internal jugular single-lumen Port-A-Cath with tip terminating in the right atrium. Tracheostomy tube in position with tip in the mid trachea. Mediastinum/Nodes: No pathologically enlarged mediastinal or hilar lymph nodes. Esophagus is unremarkable in appearance. No axillary lymphadenopathy. Lungs/Pleura: Chronic mass-like architectural distortion in the perihilar aspect of the left upper lobe and posterior aspect of the left lower lobe, similar to prior examinations, most compatible  with areas of chronic postradiation mass-like fibrosis. Thickening of the peribronchovascular interstitium and micro nodularity is noted in the basal segments of the left lower lobe, similar to the prior study, favored to reflect areas of chronic mucoid impaction within terminal bronchioles. 7 mm left lower lobe pulmonary nodule (axial image 87 of series 4), unchanged. No other new suspicious appearing pulmonary nodules or masses are noted. No acute consolidative airspace disease. No pleural effusions. Musculoskeletal: There are no aggressive appearing lytic or blastic lesions noted in the visualized portions of the skeleton. CT ABDOMEN PELVIS FINDINGS Hepatobiliary: No suspicious cystic or solid hepatic lesions. No intra or extrahepatic biliary ductal dilatation. Gallbladder is nearly completely collapsed, but otherwise unremarkable in appearance. Pancreas: No pancreatic mass. No pancreatic ductal dilatation. No pancreatic or peripancreatic fluid collections or inflammatory changes. Spleen: Unremarkable. Adrenals/Urinary Tract: 1.8 cm low-attenuation lesion in the upper pole of the right kidney is compatible with a simple cyst (no imaging follow-up is recommended). Left kidney and bilateral adrenal glands are normal in appearance. No hydroureteronephrosis. Urinary bladder wall appears mildly diffusely thickened, similar to the prior study. Stomach/Bowel: The appearance of the stomach is normal. No pathologic dilatation of small bowel or colon. Numerous colonic diverticula are noted, particularly in the descending colon and sigmoid colon, without surrounding inflammatory changes to indicate an acute diverticulitis at this time. Several new areas of mural thickening are noted in the region of the distal small bowel, best appreciated on sagittal images 58 and 72 of series 6 as well as axial images 94 and 96 of series 2. Normal appendix. Vascular/Lymphatic: Atherosclerosis of the abdominal aorta and pelvic vasculature,  without evidence of aneurysm or dissection. No lymphadenopathy noted in the abdomen or pelvis. Reproductive: Brachytherapy implants throughout the prostate gland. Seminal vesicles are unremarkable in appearance. Other: Trace volume of ascites in the low anatomic pelvis. No pneumoperitoneum. Musculoskeletal: There are no aggressive appearing lytic or blastic lesions noted in the visualized portions of the skeleton. IMPRESSION: 1. Interval development of areas of severe mural thickening in the distal small bowel, somewhat mass-like in appearance. This is nonspecific, but concerning for potential small bowel metastasis. Further clinical evaluation is suggested. Given the position of these lesions, this is unlikely to be accessible by either endoscopy or colonoscopy. Capsule endoscopy could be considered  if clinically appropriate. 2. Otherwise, stable examination demonstrating chronic postradiation mass-like fibrosis in the left lung with multiple small pulmonary nodules which are stable compared to prior examinations, favored to be benign. 3. Colonic diverticulosis without evidence of acute diverticulitis at this time. 4. Trace volume of ascites. 5. Aortic atherosclerosis, in addition to left main and three-vessel coronary artery disease. 6. Additional incidental findings, as above. These results will be called to the ordering clinician or representative by the Radiologist Assistant, and communication documented in the PACS or [employer]. Electronically Signed   By: Vinnie Langton M.D.   On: 11/20/2021 11:21   CT Abdomen Pelvis W Contrast  Result Date: 11/20/2021 CLINICAL DATA:  81 year old male with history of non-small cell lung cancer. Follow-up study. * Tracking Code: BO * EXAM: CT CHEST, ABDOMEN, AND PELVIS WITH CONTRAST TECHNIQUE: Multidetector CT imaging of the chest, abdomen and pelvis was performed following the standard protocol during bolus administration of intravenous contrast. RADIATION DOSE  REDUCTION: This exam was performed according to the departmental dose-optimization program which includes automated exposure control, adjustment of the mA and/or kV according to patient size and/or use of iterative reconstruction technique. CONTRAST:  146m OMNIPAQUE IOHEXOL 300 MG/ML  SOLN COMPARISON:  CT of the chest, abdomen and pelvis 08/31/2021. FINDINGS: CT CHEST FINDINGS Cardiovascular: Heart size is normal. There is no significant pericardial fluid, thickening or pericardial calcification. There is aortic atherosclerosis, as well as atherosclerosis of the great vessels of the mediastinum and the coronary arteries, including calcified atherosclerotic plaque in the left main, left anterior descending, left circumflex and right coronary arteries. Right internal jugular single-lumen Port-A-Cath with tip terminating in the right atrium. Tracheostomy tube in position with tip in the mid trachea. Mediastinum/Nodes: No pathologically enlarged mediastinal or hilar lymph nodes. Esophagus is unremarkable in appearance. No axillary lymphadenopathy. Lungs/Pleura: Chronic mass-like architectural distortion in the perihilar aspect of the left upper lobe and posterior aspect of the left lower lobe, similar to prior examinations, most compatible with areas of chronic postradiation mass-like fibrosis. Thickening of the peribronchovascular interstitium and micro nodularity is noted in the basal segments of the left lower lobe, similar to the prior study, favored to reflect areas of chronic mucoid impaction within terminal bronchioles. 7 mm left lower lobe pulmonary nodule (axial image 87 of series 4), unchanged. No other new suspicious appearing pulmonary nodules or masses are noted. No acute consolidative airspace disease. No pleural effusions. Musculoskeletal: There are no aggressive appearing lytic or blastic lesions noted in the visualized portions of the skeleton. CT ABDOMEN PELVIS FINDINGS Hepatobiliary: No suspicious  cystic or solid hepatic lesions. No intra or extrahepatic biliary ductal dilatation. Gallbladder is nearly completely collapsed, but otherwise unremarkable in appearance. Pancreas: No pancreatic mass. No pancreatic ductal dilatation. No pancreatic or peripancreatic fluid collections or inflammatory changes. Spleen: Unremarkable. Adrenals/Urinary Tract: 1.8 cm low-attenuation lesion in the upper pole of the right kidney is compatible with a simple cyst (no imaging follow-up is recommended). Left kidney and bilateral adrenal glands are normal in appearance. No hydroureteronephrosis. Urinary bladder wall appears mildly diffusely thickened, similar to the prior study. Stomach/Bowel: The appearance of the stomach is normal. No pathologic dilatation of small bowel or colon. Numerous colonic diverticula are noted, particularly in the descending colon and sigmoid colon, without surrounding inflammatory changes to indicate an acute diverticulitis at this time. Several new areas of mural thickening are noted in the region of the distal small bowel, best appreciated on sagittal images 58 and 72 of series 6  as well as axial images 94 and 96 of series 2. Normal appendix. Vascular/Lymphatic: Atherosclerosis of the abdominal aorta and pelvic vasculature, without evidence of aneurysm or dissection. No lymphadenopathy noted in the abdomen or pelvis. Reproductive: Brachytherapy implants throughout the prostate gland. Seminal vesicles are unremarkable in appearance. Other: Trace volume of ascites in the low anatomic pelvis. No pneumoperitoneum. Musculoskeletal: There are no aggressive appearing lytic or blastic lesions noted in the visualized portions of the skeleton. IMPRESSION: 1. Interval development of areas of severe mural thickening in the distal small bowel, somewhat mass-like in appearance. This is nonspecific, but concerning for potential small bowel metastasis. Further clinical evaluation is suggested. Given the position of  these lesions, this is unlikely to be accessible by either endoscopy or colonoscopy. Capsule endoscopy could be considered if clinically appropriate. 2. Otherwise, stable examination demonstrating chronic postradiation mass-like fibrosis in the left lung with multiple small pulmonary nodules which are stable compared to prior examinations, favored to be benign. 3. Colonic diverticulosis without evidence of acute diverticulitis at this time. 4. Trace volume of ascites. 5. Aortic atherosclerosis, in addition to left main and three-vessel coronary artery disease. 6. Additional incidental findings, as above. These results will be called to the ordering clinician or representative by the Radiologist Assistant, and communication documented in the PACS or [employer]. Electronically Signed   By: Vinnie Langton M.D.   On: 11/20/2021 11:21   VAS [place] LOWER EXTREMITY VENOUS REFLUX  Result Date: 11/01/2021  Lower Venous Reflux Study Patient Name:  Shaun Kennedy  Date of Exam:   11/01/2021 Medical Rec #: [id number]       Accession #:    [id number] Date of Birth: 04/05/1940       Patient Gender: M Patient Age:   15 years Exam Location:  [facility] Procedure:      VAS [place] LOWER EXTREMITY VENOUS REFLUX Referring Phys: Harold Barban --------------------------------------------------------------------------------  Indications: Venous stasis. Patient reports no complaints with his legs.  Performing Technologist: Ronal Fear RVS, RCS  Examination Guidelines: A complete evaluation includes B-mode imaging, spectral Doppler, color Doppler, and power Doppler as needed of all accessible portions of each vessel. Bilateral testing is considered an integral part of a complete examination. Limited examinations for reoccurring indications may be performed as noted. The reflux portion of the exam is performed with the patient in reverse Trendelenburg. Significant venous reflux is defined as >500 ms in the superficial  venous system, and >1 second in the deep venous system.  Venous Reflux Times +------------------+---------+------+-----------+------------+----------------+ RIGHT             Reflux NoRefluxReflux TimeDiameter cmsComments                                     Yes                                          +------------------+---------+------+-----------+------------+----------------+ CFV               no                                                     +------------------+---------+------+-----------+------------+----------------+  FV mid            no                                                     +------------------+---------+------+-----------+------------+----------------+ GSV at [facility]        no                            0.37                     +------------------+---------+------+-----------+------------+----------------+ GSV prox thigh    no                            0.26                     +------------------+---------+------+-----------+------------+----------------+ GSV mid thigh     no                            0.28                     +------------------+---------+------+-----------+------------+----------------+ GSV dist thigh    no                            0.30                     +------------------+---------+------+-----------+------------+----------------+ GSV at knee       no                            0.21                     +------------------+---------+------+-----------+------------+----------------+ SSV Pop Fossa     no                            0.56    chronic thrombus +------------------+---------+------+-----------+------------+----------------+ anterior accessory          yes    >500 ms      0.46                     +------------------+---------+------+-----------+------------+----------------+   Summary: Right: - No evidence of deep vein thrombosis from the common femoral through the popliteal veins  -  Chronic superficial thrombus involving the small saphenous vein. - The deep venous system is competent. - The great saphenous vein is competent. - The small saphenous vein is competent.  - Of note: Heavily diseased superficial femoral artery with mid segment velocities suggestive of a 50-74% stenosis.  *See table(s) above for measurements and observations. Electronically signed by Harold Barban MD on 11/01/2021 at 4:25:46 PM.    Final      ASSESSMENT/PLAN:  This is a very pleasant 81 year old [name] diagnosed with stage IV (T3, N2, M1a) non-small cell lung cancer, squamous cell carcinoma.  He presented with a left lower lobe obstructive lung mass in addition to mediastinal lymphadenopathy.  He also presented with bilateral pulmonary nodules.  He was diagnosed in July 2021.  His PD-L1 expression is negative.  He completed palliative radiotherapy to the obstructive lung mass under the care of Dr. Lisbeth Renshaw in August 2021.   The patient is currently undergoing systemic chemotherapy with carboplatin and paclitaxel for 2 cycles in addition to immunotherapy with ipilimumab 1 mg/KG every 6 weeks and nivolumab 360 mg IV every 3 weeks.  He is status post 19 cycles.   The patient was seen with Dr. Julien Nordmann. The patient recently had a restaging CT scan performed. Dr. Julien Nordmann personally and independnetly reviewed the scan and discussed the results with the patient. The patient has a stable exam from the lung cancer standpoint, however, there is an interval development of severe mural thickening in the distal small bowel, somewhat mass-like in appearance. This is nonspecific but concerning for small bowel metastasis.  Given the position of these lesions, this is unlikely to be accessible by endoscopy or colonoscopy.  Therefore Endoscopy could be considered.  Dr. Julien Nordmann recommends referral to GI to evaluate this area.  The patient is asymptomatic denies any abdominal pain, nausea, vomiting, blood in the  stool, diarrhea, or constipation.  No tenderness on exam today.  Dr. Julien Nordmann recommends that the patient continue on the same treatment at the same dose.   Labs were reviewed.  Recommend that he proceed with cycle 20    We will see him back for follow-up visit in 3 weeks for evaluation before starting the next cycle of treatment.    He will continue to follow with ENT for his tracheostomy.    The patient was advised to call immediately if he has any concerning symptoms in the interval.   The patient was advised to call immediately if she has any concerning symptoms in the interval. The patient voices understanding of current disease status and treatment options and is in agreement with the current care plan. All questions were answered. The patient knows to call the clinic with any problems, questions or concerns. We can certainly see the patient much sooner if necessary    Orders Placed This Encounter  Procedures   Ambulatory referral to Gastroenterology    Referral Priority:   Routine    Referral Type:   Consultation    Referral Reason:   Specialty Services Required    Number of Visits Requested:   [place], PA-C 11/24/21  ADDENDUM: Hematology/Oncology Attending: I had a face-to-face encounter with the patient today.  I reviewed his record, lab, scan and recommended his care plan.  This is a very pleasant 81 years old [name] with stage IV non-small cell lung cancer, squamous cell carcinoma diagnosed in July 2021 with negative PD-L1 expression.  He started induction treatment with systemic chemotherapy with carboplatin and paclitaxel every 3 weeks for 2 cycles in addition to ipilimumab and nivolumab for the first cycle followed by maintenance treatment with nivolumab and ipilimumab status post 19 cycles.  The patient has been tolerating this treatment well with no concerning adverse effects. He had repeat CT scan of the chest, abdomen pelvis  performed recently.  I personally and independently reviewed the scan and discussed the result with the patient today. His scan showed no concerning findings for disease progression.  The scan showed incidental finding of severe mural thickening in the distal small bowel with somewhat masslike in appearance.  This is nonspecific but concerning for potential small bowel metastasis or other abnormality.  The patient is currently asymptomatic. I recommended for the patient to continue his current treatment with ipilimumab and nivolumab  and he will proceed with cycle #20 today. We will refer the patient to gastroenterology for evaluation of the suspicious finding in the small bowel. The patient will come back for follow-up visit in 3 weeks for day 22 of cycle #20. He was advised to call immediately if he has any other concerning symptoms in the interval. The total time spent in the appointment was 30 minutes. Disclaimer: This note was dictated with voice recognition software. Similar sounding words can inadvertently be transcribed and may be missed upon review. Eilleen Kempf, MD

## 2021-11-24 ENCOUNTER — Inpatient Hospital Stay: Payer: Medicare HMO

## 2021-11-24 ENCOUNTER — Other Ambulatory Visit: Payer: Self-pay

## 2021-11-24 ENCOUNTER — Inpatient Hospital Stay: Payer: Medicare HMO | Attending: Internal Medicine | Admitting: Physician Assistant

## 2021-11-24 VITALS — BP 140/68 | HR 68 | Temp 98.8°F | Resp 18

## 2021-11-24 VITALS — BP 138/62 | HR 60 | Temp 98.3°F | Resp 18 | Ht 67.0 in | Wt 185.2 lb

## 2021-11-24 DIAGNOSIS — C3432 Malignant neoplasm of lower lobe, left bronchus or lung: Secondary | ICD-10-CM

## 2021-11-24 DIAGNOSIS — Z87891 Personal history of nicotine dependence: Secondary | ICD-10-CM | POA: Insufficient documentation

## 2021-11-24 DIAGNOSIS — Z5112 Encounter for antineoplastic immunotherapy: Secondary | ICD-10-CM | POA: Insufficient documentation

## 2021-11-24 DIAGNOSIS — Z79899 Other long term (current) drug therapy: Secondary | ICD-10-CM | POA: Diagnosis not present

## 2021-11-24 DIAGNOSIS — Z7982 Long term (current) use of aspirin: Secondary | ICD-10-CM | POA: Insufficient documentation

## 2021-11-24 DIAGNOSIS — I10 Essential (primary) hypertension: Secondary | ICD-10-CM | POA: Insufficient documentation

## 2021-11-24 DIAGNOSIS — R933 Abnormal findings on diagnostic imaging of other parts of digestive tract: Secondary | ICD-10-CM | POA: Insufficient documentation

## 2021-11-24 DIAGNOSIS — Z923 Personal history of irradiation: Secondary | ICD-10-CM | POA: Insufficient documentation

## 2021-11-24 DIAGNOSIS — Z95828 Presence of other vascular implants and grafts: Secondary | ICD-10-CM

## 2021-11-24 DIAGNOSIS — E119 Type 2 diabetes mellitus without complications: Secondary | ICD-10-CM | POA: Diagnosis not present

## 2021-11-24 DIAGNOSIS — Z7984 Long term (current) use of oral hypoglycemic drugs: Secondary | ICD-10-CM | POA: Insufficient documentation

## 2021-11-24 LAB — CBC WITH DIFFERENTIAL (CANCER CENTER ONLY)
Abs Immature Granulocytes: 0.01 10*3/uL (ref 0.00–0.07)
Basophils Absolute: 0 10*3/uL (ref 0.0–0.1)
Basophils Relative: 0 %
Eosinophils Absolute: 0.4 10*3/uL (ref 0.0–0.5)
Eosinophils Relative: 8 %
HCT: 32.8 % — ABNORMAL LOW (ref 39.0–52.0)
Hemoglobin: 11.1 g/dL — ABNORMAL LOW (ref 13.0–17.0)
Immature Granulocytes: 0 %
Lymphocytes Relative: 24 %
Lymphs Abs: 1.1 10*3/uL (ref 0.7–4.0)
MCH: 31.3 pg (ref 26.0–34.0)
MCHC: 33.8 g/dL (ref 30.0–36.0)
MCV: 92.4 fL (ref 80.0–100.0)
Monocytes Absolute: 0.8 10*3/uL (ref 0.1–1.0)
Monocytes Relative: 17 %
Neutro Abs: 2.5 10*3/uL (ref 1.7–7.7)
Neutrophils Relative %: 51 %
Platelet Count: 269 10*3/uL (ref 150–400)
RBC: 3.55 MIL/uL — ABNORMAL LOW (ref 4.22–5.81)
RDW: 12.4 % (ref 11.5–15.5)
WBC Count: 4.8 10*3/uL (ref 4.0–10.5)
nRBC: 0 % (ref 0.0–0.2)

## 2021-11-24 LAB — CMP (CANCER CENTER ONLY)
ALT: 11 U/L (ref 0–44)
AST: 16 U/L (ref 15–41)
Albumin: 3.7 g/dL (ref 3.5–5.0)
Alkaline Phosphatase: 59 U/L (ref 38–126)
Anion gap: 5 (ref 5–15)
BUN: 19 mg/dL (ref 8–23)
CO2: 28 mmol/L (ref 22–32)
Calcium: 9 mg/dL (ref 8.9–10.3)
Chloride: 104 mmol/L (ref 98–111)
Creatinine: 1.27 mg/dL — ABNORMAL HIGH (ref 0.61–1.24)
GFR, Estimated: 56 mL/min — ABNORMAL LOW (ref >60)
Glucose, Bld: 92 mg/dL (ref 70–99)
Potassium: 4 mmol/L (ref 3.5–5.1)
Sodium: 137 mmol/L (ref 135–145)
Total Bilirubin: 0.8 mg/dL (ref 0.3–1.2)
Total Protein: 8.1 g/dL (ref 6.5–8.1)

## 2021-11-24 LAB — TSH: TSH: 1.372 u[IU]/mL (ref 0.350–4.500)

## 2021-11-24 MED ORDER — SODIUM CHLORIDE 0.9% FLUSH
10.0000 mL | INTRAVENOUS | Status: DC | PRN
  Administered 2021-11-24: 10 mL

## 2021-11-24 MED ORDER — SODIUM CHLORIDE 0.9 % IV SOLN: Freq: Once | INTRAVENOUS | Status: AC

## 2021-11-24 MED ORDER — DIPHENHYDRAMINE HCL 50 MG/ML IJ SOLN
25.0000 mg | Freq: Once | INTRAMUSCULAR | Status: AC
  Administered 2021-11-24: 25 mg via INTRAVENOUS
  Filled 2021-11-24: qty 1

## 2021-11-24 MED ORDER — SODIUM CHLORIDE 0.9 % IV SOLN
1.0000 mg/kg | Freq: Once | INTRAVENOUS | Status: AC
  Administered 2021-11-24: 85 mg via INTRAVENOUS
  Filled 2021-11-24: qty 17

## 2021-11-24 MED ORDER — SODIUM CHLORIDE 0.9 % IV SOLN
360.0000 mg | Freq: Once | INTRAVENOUS | Status: AC
  Administered 2021-11-24: 360 mg via INTRAVENOUS
  Filled 2021-11-24: qty 24

## 2021-11-24 MED ORDER — FAMOTIDINE IN NACL 20-0.9 MG/50ML-% IV SOLN
20.0000 mg | Freq: Once | INTRAVENOUS | Status: AC
  Administered 2021-11-24: 20 mg via INTRAVENOUS
  Filled 2021-11-24: qty 50

## 2021-11-24 MED ORDER — SODIUM CHLORIDE 0.9% FLUSH
10.0000 mL | Freq: Once | INTRAVENOUS | Status: AC
  Administered 2021-11-24: 10 mL

## 2021-11-24 MED ORDER — HEPARIN SOD (PORK) LOCK FLUSH 100 UNIT/ML IV SOLN
500.0000 [IU] | Freq: Once | INTRAVENOUS | Status: AC | PRN
  Administered 2021-11-24: 500 [IU]

## 2021-11-24 NOTE — Patient Instructions (Signed)
[facility]  Discharge Instructions: Thank you for choosing [facility] to provide your oncology and hematology care.   If you have a lab appointment with the [facility], please go directly to the [facility] and check in at the registration area.   Wear comfortable clothing and clothing appropriate for easy access to any Portacath or PICC line.   We strive to give you quality time with your provider. You may need to reschedule your appointment if you arrive late (15 or more minutes).  Arriving late affects you and other patients whose appointments are after yours.  Also, if you miss three or more appointments without notifying the office, you may be dismissed from the clinic at the provider's discretion.      For prescription refill requests, have your pharmacy contact our office and allow 72 hours for refills to be completed.    Today you received the following chemotherapy and/or immunotherapy agents opdivo      To help prevent nausea and vomiting after your treatment, we encourage you to take your nausea medication as directed.  BELOW ARE SYMPTOMS THAT SHOULD BE REPORTED IMMEDIATELY: *FEVER GREATER THAN 100.4 F (38 C) OR HIGHER *CHILLS OR SWEATING *NAUSEA AND VOMITING THAT IS NOT CONTROLLED WITH YOUR NAUSEA MEDICATION *UNUSUAL SHORTNESS OF BREATH *UNUSUAL BRUISING OR BLEEDING *URINARY PROBLEMS (pain or burning when urinating, or frequent urination) *BOWEL PROBLEMS (unusual diarrhea, constipation, pain near the anus) TENDERNESS IN MOUTH AND THROAT WITH OR WITHOUT PRESENCE OF ULCERS (sore throat, sores in mouth, or a toothache) UNUSUAL RASH, SWELLING OR PAIN  UNUSUAL VAGINAL DISCHARGE OR ITCHING   Items with * indicate a potential emergency and should be followed up as soon as possible or go to the Emergency Department if any problems should occur.  Please show the CHEMOTHERAPY ALERT CARD or IMMUNOTHERAPY ALERT CARD at check-in to the  Emergency Department and triage nurse.  Should you have questions after your visit or need to cancel or reschedule your appointment, please contact [facility]  Dept: [phone]  and follow the prompts.  Office hours are 8:00 a.m. to 4:30 p.m. Monday - Friday. Please note that voicemails left after 4:00 p.m. may not be returned until the following business day.  We are closed weekends and major holidays. You have access to a nurse at all times for urgent questions. Please call the main number to the clinic Dept: [phone] and follow the prompts.   For any non-urgent questions, you may also contact your provider using MyChart. We now offer e-Visits for anyone 71 and older to request care online for non-urgent symptoms. For details visit [email or web address].   Also download the MyChart app! Go to the app store, search "MyChart", open the app, select [facility], and log in with your MyChart username and password.  Masks are optional in the cancer centers. If you would like for your care team to wear a mask while they are taking care of you, please let them know. You may have one support person who is at least 83 years old accompany you for your appointments.

## 2021-11-24 NOTE — Progress Notes (Signed)

## 2021-11-25 LAB — T4: T4, Total: 7.8 ug/dL (ref 4.5–12.0)

## 2021-11-29 ENCOUNTER — Encounter: Payer: Self-pay | Admitting: Internal Medicine

## 2021-11-30 ENCOUNTER — Ambulatory Visit: Payer: Self-pay

## 2021-11-30 NOTE — Patient Outreach (Signed)
  Care Coordination   11/30/2021 Name: Shaun Kennedy MRN: [id number] DOB: 1940-07-06   Care Coordination Outreach Attempts:  An unsuccessful telephone outreach was attempted for a scheduled appointment today.  Follow Up Plan:  Additional outreach attempts will be made to offer the patient care coordination information and services.   Encounter Outcome:  No Answer  Care Coordination Interventions Activated:  No   Care Coordination Interventions:  No, not indicated    Barb Merino, RN, BSN, CCM Care Management Coordinator [facility] Care Management Direct Phone: [phone]

## 2021-12-08 ENCOUNTER — Telehealth: Payer: Self-pay

## 2021-12-08 NOTE — Telephone Encounter (Signed)
This nurse reached out to this patient related to a message left about his appointments.  Patient stated he is not sure when his next appointment is.  This nurse informed him of scheduled for labs, office visit and then infusion on 11/8.  Patient acknowledged understanding.  No further questions at this time.

## 2021-12-12 NOTE — Progress Notes (Unsigned)
[facility] OFFICE PROGRESS NOTE  [name], [place] [address]  DIAGNOSIS: Stage IV (T3, N2, M1 a) non-small cell lung cancer, squamous cell carcinoma presented with obstructive left lower lobe lung mass in addition to mediastinal lymphadenopathy as well as bilateral pulmonary nodules diagnosed in July 2021.   PDL1: 0%  PRIOR THERAPY: Palliative radiotherapy to the obstructive left lower lobe lung mass under the care of Dr. Lisbeth Renshaw. Completed on 09/20/19   CURRENT THERAPY: Systemic chemotherapy 2 cycles of chemotherapy with carboplatin for an AUC of 5 and paclitaxel 175 mg/m2 in addition to immunotherapy with nivolumab 360 mg every 3 weeks and ipilimumab 1 mg/kg IV every 6 weeks followed by maintenance nivolumab and ipilimumab.  He started the first treatment on 09/04/2019.  He is status post 20 cycles of treatment.    INTERVAL HISTORY: LYNCOLN LEDGERWOOD 81 y.o. male returns to the clinic today for a follow up visit. The patient is feeling well today without any concerning complaints. He follows closely with ENT for his tracheostomy.  The patient continues to tolerate treatment with immunotherapy well without any adverse side effects. He is feeling well today. Denies any fever, chills, night sweats. He reprots breathing well, he denies any cough or hemoptysis. He denies any shortness of breath.  Denies any nausea, vomiting, blood in the stool, constipation, or diarrhea, or abdominal pain.  Denies any rashes or skin changes. At his last appointment, he had a restaging CT scan which noted  mural thickening in the distal small bowel, somewhat mass-like in appearance. This is nonspecific but concerning for small bowel metastasis.  Given the position of these lesions, this is unlikely to be accessible by endoscopy or colonoscopy.  Therefore capsule endoscopy could be considered. The patient was referred to GI. He has an appointment with them scheduled on ***. He  is here for evaluation before starting day 22 cycle #20 today.   MEDICAL HISTORY: Past Medical History:  Diagnosis Date   Allergic rhinitis    Asthma    Carotid stenosis    Colon cancer ([place]) 2003   Diabetes mellitus ([facility])    Diverticulosis    Dyslipidemia    ED (erectile dysfunction)    GERD (gastroesophageal reflux disease)    H/O degenerative disc disease    Hemorrhoids    HTN (hypertension)    as a child   Hyperlipidemia    Lung cancer ([place])    LVH (left ventricular hypertrophy)    on EKG   Mass of lower lobe of left lung    Mediastinal adenopathy    Smoker    former   Wears dentures    Wears dentures    Wears glasses    Wears glasses     ALLERGIES:  has No Known Allergies.  MEDICATIONS:  Current Outpatient Medications  Medication Sig Dispense Refill   Alcohol Swabs (DROPSAFE ALCOHOL PREP) 70 % PADS Apply topically.     amLODipine (NORVASC) 5 MG tablet TAKE 1 TABLET EVERY DAY 90 tablet 3   aspirin EC 81 MG tablet Take 81 mg by mouth daily after breakfast.      betamethasone valerate ointment (VALISONE) 0.1 % APPLY 1 APPLICATION TOPICALLY 2 TIMES DAILY 45 g 0   bisacodyl (DULCOLAX) 5 MG EC tablet Take 10 mg by mouth daily as needed for moderate constipation.     Blood Glucose Calibration (TRUE METRIX LEVEL 1) Low SOLN      Blood Glucose Monitoring Suppl (  TRUE METRIX AIR GLUCOSE METER) w/Device KIT USE AS DIRECTED 1 kit 0   Borage, Borago officinalis, (BORAGE OIL PO) Take 2,300 mg by mouth.     cholecalciferol (VITAMIN D) 25 MCG (1000 UNIT) tablet Take 1,000 Units by mouth daily.     donepezil (ARICEPT) 5 MG tablet Take 1 tablet (5 mg total) by mouth at bedtime. 90 tablet 0   dronabinol (MARINOL) 2.5 MG capsule Take 1 capsule (2.5 mg total) by mouth 2 (two) times daily before a meal. 60 capsule 1   ferrous sulfate 325 (65 FE) MG EC tablet TAKE 1 TABLET (325 MG TOTAL) DAILY WITH BREAKFAST. 90 tablet 1   gabapentin (NEURONTIN) 100 MG capsule Take by mouth.      hydrOXYzine (ATARAX) 10 MG tablet Take 1 tablet (10 mg total) by mouth 3 (three) times daily as needed. 90 tablet 1   Lancet Devices (TRUEDRAW LANCING DEVICE) MISC Check sugars daily 90 each 0   linaclotide (LINZESS) 72 MCG capsule Take by mouth.     lisinopril-hydrochlorothiazide (ZESTORETIC) 20-12.5 MG tablet TAKE 1 TABLET EVERY DAY 90 tablet 1   metFORMIN (GLUCOPHAGE-XR) 500 MG 24 hr tablet TAKE 1 TABLET EVERY DAY 90 tablet 0   Multiple Vitamin (MULTIVITAMIN WITH MINERALS) TABS tablet Take 1 tablet by mouth daily after breakfast.      simvastatin (ZOCOR) 40 MG tablet TAKE 1 TABLET EVERY DAY 90 tablet 1   solifenacin (VESICARE) 5 MG tablet TAKE 1 TABLET EVERY DAY 90 tablet 1   TRUE METRIX BLOOD GLUCOSE TEST test strip TEST BLOOD GLUCOSE  UP TO TWO TIMES DAILY 200 strip 2   TRUEplus Lancets 33G MISC 1 each by Does not apply route 2 (two) times daily. 200 each 4   No current facility-administered medications for this visit.    SURGICAL HISTORY:  Past Surgical History:  Procedure Laterality Date   BRONCHIAL BIOPSY  08/20/2019   Procedure: BRONCHIAL BIOPSIES;  Surgeon: Collene Gobble, MD;  Location: [facility] ENDOSCOPY;  Service: Pulmonary;;   BRONCHIAL BRUSHINGS  08/20/2019   Procedure: BRONCHIAL BRUSHINGS;  Surgeon: Collene Gobble, MD;  Location: [facility] ENDOSCOPY;  Service: Pulmonary;;   BRONCHIAL NEEDLE ASPIRATION BIOPSY  08/20/2019   Procedure: BRONCHIAL NEEDLE ASPIRATION BIOPSIES;  Surgeon: Collene Gobble, MD;  Location: [facility] ENDOSCOPY;  Service: Pulmonary;;   CARPAL TUNNEL RELEASE Right 01/09/2019   Procedure: CARPAL TUNNEL RELEASE;  Surgeon: Leandrew Koyanagi, MD;  Location: [facility];  Service: Orthopedics;  Laterality: Right;   CARPAL TUNNEL WITH CUBITAL TUNNEL Right 11/18/2020   Procedure: CARPAL TUNNEL WITH CUBITAL TUNNEL;  Surgeon: Charlotte Crumb, MD;  Location: [facility];  Service: Orthopedics;  Laterality: Right;   CATARACT EXTRACTION Right 2018   COLONOSCOPY  2007   Dr. Benson [place]    DIRECT LARYNGOSCOPY N/A 04/10/2020   Procedure: DIRECT LARYNGOSCOPY;  Surgeon: Melida Quitter, MD;  Location: [facility];  Service: ENT;  Laterality: N/A;   I & D EXTREMITY Right 04/02/2016   Procedure: IRRIGATION AND DEBRIDEMENT GREAT TOE;  Surgeon: Leandrew Koyanagi, MD;  Location: [facility];  Service: Orthopedics;  Laterality: Right;   IR IMAGING GUIDED PORT INSERTION  08/30/2019   MULTIPLE TOOTH EXTRACTIONS     RADIOACTIVE SEED IMPLANT  2003   TRACHEOSTOMY TUBE PLACEMENT N/A 04/10/2020   Procedure: TRACHEOSTOMY;  Surgeon: Melida Quitter, MD;  Location: [facility];  Service: ENT;  Laterality: N/A;   ULNAR TUNNEL RELEASE Right 01/09/2019   Procedure: RIGHT CUBITAL TUNNEL RELEASE AND CARPAL TUNNEL  RELEASE;  Surgeon: Leandrew Koyanagi, MD;  Location: [facility];  Service: Orthopedics;  Laterality: Right;   UPPER GASTROINTESTINAL ENDOSCOPY     VIDEO BRONCHOSCOPY N/A 12/18/2019   Procedure: VIDEO BRONCHOSCOPY WITHOUT FLUORO;  Surgeon: Collene Gobble, MD;  Location: [facility] ENDOSCOPY;  Service: Cardiopulmonary;  Laterality: N/A;   VIDEO BRONCHOSCOPY WITH ENDOBRONCHIAL ULTRASOUND N/A 08/20/2019   Procedure: VIDEO BRONCHOSCOPY WITH ENDOBRONCHIAL ULTRASOUND;  Surgeon: Collene Gobble, MD;  Location: [facility] ENDOSCOPY;  Service: Pulmonary;  Laterality: N/A;    REVIEW OF SYSTEMS:   Review of Systems  Constitutional: Negative for appetite change, chills, fatigue, fever and unexpected weight change.  HENT:   Negative for mouth sores, nosebleeds, sore throat and trouble swallowing.   Eyes: Negative for eye problems and icterus.  Respiratory: Negative for cough, hemoptysis, shortness of breath and wheezing.   Cardiovascular: Negative for chest pain and leg swelling.  Gastrointestinal: Negative for abdominal pain, constipation, diarrhea, nausea and vomiting.  Genitourinary: Negative for bladder incontinence, difficulty urinating, dysuria, frequency and hematuria.   Musculoskeletal: Negative for back pain, gait problem, neck pain  and neck stiffness.  Skin: Negative for itching and rash.  Neurological: Negative for dizziness, extremity weakness, gait problem, headaches, light-headedness and seizures.  Hematological: Negative for adenopathy. Does not bruise/bleed easily.  Psychiatric/Behavioral: Negative for confusion, depression and sleep disturbance. The patient is not nervous/anxious.     PHYSICAL EXAMINATION:  There were no vitals taken for this visit.  ECOG PERFORMANCE STATUS: {CHL ONC ECOG [id number]  Physical Exam  Constitutional: Oriented to person, place, and time and well-developed, well-nourished, and in no distress. No distress.  HENT:  Head: Normocephalic and atraumatic.  Mouth/Throat: Oropharynx is clear and moist. No oropharyngeal exudate.  Eyes: Conjunctivae are normal. Right eye exhibits no discharge. Left eye exhibits no discharge. No scleral icterus.  Neck: Normal range of motion. Neck supple.  Cardiovascular: Normal rate, regular rhythm, normal heart sounds and intact distal pulses.   Pulmonary/Chest: Effort normal and breath sounds normal. No respiratory distress. No wheezes. No rales.  Abdominal: Soft. Bowel sounds are normal. Exhibits no distension and no mass. There is no tenderness.  Musculoskeletal: Normal range of motion. Exhibits no edema.  Lymphadenopathy:    No cervical adenopathy.  Neurological: Alert and oriented to person, place, and time. Exhibits normal muscle tone. Gait normal. Coordination normal.  Skin: Skin is warm and dry. No rash noted. Not diaphoretic. No erythema. No pallor.  Psychiatric: Mood, memory and judgment normal.  Vitals reviewed.  LABORATORY DATA: Lab Results  Component Value Date   WBC 4.8 11/24/2021   HGB 11.1 (L) 11/24/2021   HCT 32.8 (L) 11/24/2021   MCV 92.4 11/24/2021   PLT 269 11/24/2021      Chemistry      Component Value Date/Time   NA 137 11/24/2021 1002   NA 146 (H) 10/04/2021 1616   K 4.0 11/24/2021 1002   CL 104 11/24/2021 1002    CO2 28 11/24/2021 1002   BUN 19 11/24/2021 1002   BUN 12 10/04/2021 1616   CREATININE 1.27 (H) 11/24/2021 1002   CREATININE 1.25 (H) 08/08/2016 0912      Component Value Date/Time   CALCIUM 9.0 11/24/2021 1002   ALKPHOS 59 11/24/2021 1002   AST 16 11/24/2021 1002   ALT 11 11/24/2021 1002   BILITOT 0.8 11/24/2021 1002       RADIOGRAPHIC STUDIES:  CT Chest W Contrast  Result Date: 11/20/2021 CLINICAL DATA:  81 year old male with history  of non-small cell lung cancer. Follow-up study. * Tracking Code: BO * EXAM: CT CHEST, ABDOMEN, AND PELVIS WITH CONTRAST TECHNIQUE: Multidetector CT imaging of the chest, abdomen and pelvis was performed following the standard protocol during bolus administration of intravenous contrast. RADIATION DOSE REDUCTION: This exam was performed according to the departmental dose-optimization program which includes automated exposure control, adjustment of the mA and/or kV according to patient size and/or use of iterative reconstruction technique. CONTRAST:  110m OMNIPAQUE IOHEXOL 300 MG/ML  SOLN COMPARISON:  CT of the chest, abdomen and pelvis 08/31/2021. FINDINGS: CT CHEST FINDINGS Cardiovascular: Heart size is normal. There is no significant pericardial fluid, thickening or pericardial calcification. There is aortic atherosclerosis, as well as atherosclerosis of the great vessels of the mediastinum and the coronary arteries, including calcified atherosclerotic plaque in the left main, left anterior descending, left circumflex and right coronary arteries. Right internal jugular single-lumen Port-A-Cath with tip terminating in the right atrium. Tracheostomy tube in position with tip in the mid trachea. Mediastinum/Nodes: No pathologically enlarged mediastinal or hilar lymph nodes. Esophagus is unremarkable in appearance. No axillary lymphadenopathy. Lungs/Pleura: Chronic mass-like architectural distortion in the perihilar aspect of the left upper lobe and posterior  aspect of the left lower lobe, similar to prior examinations, most compatible with areas of chronic postradiation mass-like fibrosis. Thickening of the peribronchovascular interstitium and micro nodularity is noted in the basal segments of the left lower lobe, similar to the prior study, favored to reflect areas of chronic mucoid impaction within terminal bronchioles. 7 mm left lower lobe pulmonary nodule (axial image 87 of series 4), unchanged. No other new suspicious appearing pulmonary nodules or masses are noted. No acute consolidative airspace disease. No pleural effusions. Musculoskeletal: There are no aggressive appearing lytic or blastic lesions noted in the visualized portions of the skeleton. CT ABDOMEN PELVIS FINDINGS Hepatobiliary: No suspicious cystic or solid hepatic lesions. No intra or extrahepatic biliary ductal dilatation. Gallbladder is nearly completely collapsed, but otherwise unremarkable in appearance. Pancreas: No pancreatic mass. No pancreatic ductal dilatation. No pancreatic or peripancreatic fluid collections or inflammatory changes. Spleen: Unremarkable. Adrenals/Urinary Tract: 1.8 cm low-attenuation lesion in the upper pole of the right kidney is compatible with a simple cyst (no imaging follow-up is recommended). Left kidney and bilateral adrenal glands are normal in appearance. No hydroureteronephrosis. Urinary bladder wall appears mildly diffusely thickened, similar to the prior study. Stomach/Bowel: The appearance of the stomach is normal. No pathologic dilatation of small bowel or colon. Numerous colonic diverticula are noted, particularly in the descending colon and sigmoid colon, without surrounding inflammatory changes to indicate an acute diverticulitis at this time. Several new areas of mural thickening are noted in the region of the distal small bowel, best appreciated on sagittal images 58 and 72 of series 6 as well as axial images 94 and 96 of series 2. Normal appendix.  Vascular/Lymphatic: Atherosclerosis of the abdominal aorta and pelvic vasculature, without evidence of aneurysm or dissection. No lymphadenopathy noted in the abdomen or pelvis. Reproductive: Brachytherapy implants throughout the prostate gland. Seminal vesicles are unremarkable in appearance. Other: Trace volume of ascites in the low anatomic pelvis. No pneumoperitoneum. Musculoskeletal: There are no aggressive appearing lytic or blastic lesions noted in the visualized portions of the skeleton. IMPRESSION: 1. Interval development of areas of severe mural thickening in the distal small bowel, somewhat mass-like in appearance. This is nonspecific, but concerning for potential small bowel metastasis. Further clinical evaluation is suggested. Given the position of these lesions, this is unlikely to  be accessible by either endoscopy or colonoscopy. Capsule endoscopy could be considered if clinically appropriate. 2. Otherwise, stable examination demonstrating chronic postradiation mass-like fibrosis in the left lung with multiple small pulmonary nodules which are stable compared to prior examinations, favored to be benign. 3. Colonic diverticulosis without evidence of acute diverticulitis at this time. 4. Trace volume of ascites. 5. Aortic atherosclerosis, in addition to left main and three-vessel coronary artery disease. 6. Additional incidental findings, as above. These results will be called to the ordering clinician or representative by the Radiologist Assistant, and communication documented in the PACS or [employer]. Electronically Signed   By: Vinnie Langton M.D.   On: 11/20/2021 11:21   CT Abdomen Pelvis W Contrast  Result Date: 11/20/2021 CLINICAL DATA:  81 year old male with history of non-small cell lung cancer. Follow-up study. * Tracking Code: BO * EXAM: CT CHEST, ABDOMEN, AND PELVIS WITH CONTRAST TECHNIQUE: Multidetector CT imaging of the chest, abdomen and pelvis was performed following the  standard protocol during bolus administration of intravenous contrast. RADIATION DOSE REDUCTION: This exam was performed according to the departmental dose-optimization program which includes automated exposure control, adjustment of the mA and/or kV according to patient size and/or use of iterative reconstruction technique. CONTRAST:  1103m OMNIPAQUE IOHEXOL 300 MG/ML  SOLN COMPARISON:  CT of the chest, abdomen and pelvis 08/31/2021. FINDINGS: CT CHEST FINDINGS Cardiovascular: Heart size is normal. There is no significant pericardial fluid, thickening or pericardial calcification. There is aortic atherosclerosis, as well as atherosclerosis of the great vessels of the mediastinum and the coronary arteries, including calcified atherosclerotic plaque in the left main, left anterior descending, left circumflex and right coronary arteries. Right internal jugular single-lumen Port-A-Cath with tip terminating in the right atrium. Tracheostomy tube in position with tip in the mid trachea. Mediastinum/Nodes: No pathologically enlarged mediastinal or hilar lymph nodes. Esophagus is unremarkable in appearance. No axillary lymphadenopathy. Lungs/Pleura: Chronic mass-like architectural distortion in the perihilar aspect of the left upper lobe and posterior aspect of the left lower lobe, similar to prior examinations, most compatible with areas of chronic postradiation mass-like fibrosis. Thickening of the peribronchovascular interstitium and micro nodularity is noted in the basal segments of the left lower lobe, similar to the prior study, favored to reflect areas of chronic mucoid impaction within terminal bronchioles. 7 mm left lower lobe pulmonary nodule (axial image 87 of series 4), unchanged. No other new suspicious appearing pulmonary nodules or masses are noted. No acute consolidative airspace disease. No pleural effusions. Musculoskeletal: There are no aggressive appearing lytic or blastic lesions noted in the  visualized portions of the skeleton. CT ABDOMEN PELVIS FINDINGS Hepatobiliary: No suspicious cystic or solid hepatic lesions. No intra or extrahepatic biliary ductal dilatation. Gallbladder is nearly completely collapsed, but otherwise unremarkable in appearance. Pancreas: No pancreatic mass. No pancreatic ductal dilatation. No pancreatic or peripancreatic fluid collections or inflammatory changes. Spleen: Unremarkable. Adrenals/Urinary Tract: 1.8 cm low-attenuation lesion in the upper pole of the right kidney is compatible with a simple cyst (no imaging follow-up is recommended). Left kidney and bilateral adrenal glands are normal in appearance. No hydroureteronephrosis. Urinary bladder wall appears mildly diffusely thickened, similar to the prior study. Stomach/Bowel: The appearance of the stomach is normal. No pathologic dilatation of small bowel or colon. Numerous colonic diverticula are noted, particularly in the descending colon and sigmoid colon, without surrounding inflammatory changes to indicate an acute diverticulitis at this time. Several new areas of mural thickening are noted in the region of the distal small  bowel, best appreciated on sagittal images 58 and 72 of series 6 as well as axial images 94 and 96 of series 2. Normal appendix. Vascular/Lymphatic: Atherosclerosis of the abdominal aorta and pelvic vasculature, without evidence of aneurysm or dissection. No lymphadenopathy noted in the abdomen or pelvis. Reproductive: Brachytherapy implants throughout the prostate gland. Seminal vesicles are unremarkable in appearance. Other: Trace volume of ascites in the low anatomic pelvis. No pneumoperitoneum. Musculoskeletal: There are no aggressive appearing lytic or blastic lesions noted in the visualized portions of the skeleton. IMPRESSION: 1. Interval development of areas of severe mural thickening in the distal small bowel, somewhat mass-like in appearance. This is nonspecific, but concerning for  potential small bowel metastasis. Further clinical evaluation is suggested. Given the position of these lesions, this is unlikely to be accessible by either endoscopy or colonoscopy. Capsule endoscopy could be considered if clinically appropriate. 2. Otherwise, stable examination demonstrating chronic postradiation mass-like fibrosis in the left lung with multiple small pulmonary nodules which are stable compared to prior examinations, favored to be benign. 3. Colonic diverticulosis without evidence of acute diverticulitis at this time. 4. Trace volume of ascites. 5. Aortic atherosclerosis, in addition to left main and three-vessel coronary artery disease. 6. Additional incidental findings, as above. These results will be called to the ordering clinician or representative by the Radiologist Assistant, and communication documented in the PACS or [employer]. Electronically Signed   By: Vinnie Langton M.D.   On: 11/20/2021 11:21     ASSESSMENT/PLAN:  This is a very pleasant 81 year old [name] diagnosed with stage IV (T3, N2, M1a) non-small cell lung cancer, squamous cell carcinoma.  He presented with a left lower lobe obstructive lung mass in addition to mediastinal lymphadenopathy.  He also presented with bilateral pulmonary nodules.  He was diagnosed in July 2021.  His PD-L1 expression is negative.   He completed palliative radiotherapy to the obstructive lung mass under the care of Dr. Lisbeth Renshaw in August 2021.   The patient is currently undergoing systemic chemotherapy with carboplatin and paclitaxel for 2 cycles in addition to immunotherapy with ipilimumab 1 mg/KG every 6 weeks and nivolumab 360 mg IV every 3 weeks.  He is status post 20 cycles.    With his last restaging CT scan, it noted development of severe mural thickening in the distal small bowel, somewhat mass-like in appearance. This is nonspecific but concerning for small bowel metastasis.  Given the position of these  lesions, this is unlikely to be accessible by endoscopy or colonoscopy.  Therefore capsule endoscopy could be considered.  He was referred to GI. He has an appointment on ***.   The patient was seen with Dr. Julien Nordmann today. Labs were reviewed. Recommend he *** with day 22 cycle #20 as scheduled.     We will see him back for follow-up visit in 3 weeks for evaluation before starting the next cycle of treatment.    He will continue to follow with ENT for his tracheostomy.   The patient was advised to call immediately if she has any concerning symptoms in the interval. The patient voices understanding of current disease status and treatment options and is in agreement with the current care plan. All questions were answered. The patient knows to call the clinic with any problems, questions or concerns. We can certainly see the patient much sooner if necessary           No orders of the defined types were placed in this encounter.  I spent {CHL ONC TIME VISIT - [id number]} counseling the patient face to face. The total time spent in the appointment was {CHL ONC TIME VISIT - YYFRT:[id number]}.  Rosalio Catterton L Farhana Fellows, PA-C 12/12/21

## 2021-12-15 ENCOUNTER — Inpatient Hospital Stay (HOSPITAL_BASED_OUTPATIENT_CLINIC_OR_DEPARTMENT_OTHER): Payer: Medicare HMO | Admitting: Physician Assistant

## 2021-12-15 ENCOUNTER — Other Ambulatory Visit: Payer: Self-pay | Admitting: Physician Assistant

## 2021-12-15 ENCOUNTER — Inpatient Hospital Stay: Payer: Medicare HMO | Attending: Internal Medicine

## 2021-12-15 ENCOUNTER — Other Ambulatory Visit: Payer: Self-pay

## 2021-12-15 ENCOUNTER — Inpatient Hospital Stay: Payer: Medicare HMO

## 2021-12-15 VITALS — BP 138/64 | HR 53 | Temp 97.6°F | Resp 18

## 2021-12-15 VITALS — BP 138/66 | HR 63 | Temp 97.8°F | Resp 18 | Wt 183.9 lb

## 2021-12-15 DIAGNOSIS — I10 Essential (primary) hypertension: Secondary | ICD-10-CM | POA: Insufficient documentation

## 2021-12-15 DIAGNOSIS — Z79899 Other long term (current) drug therapy: Secondary | ICD-10-CM | POA: Diagnosis not present

## 2021-12-15 DIAGNOSIS — Z7984 Long term (current) use of oral hypoglycemic drugs: Secondary | ICD-10-CM | POA: Diagnosis not present

## 2021-12-15 DIAGNOSIS — Z5112 Encounter for antineoplastic immunotherapy: Secondary | ICD-10-CM | POA: Diagnosis not present

## 2021-12-15 DIAGNOSIS — E119 Type 2 diabetes mellitus without complications: Secondary | ICD-10-CM | POA: Diagnosis not present

## 2021-12-15 DIAGNOSIS — Z87891 Personal history of nicotine dependence: Secondary | ICD-10-CM | POA: Diagnosis not present

## 2021-12-15 DIAGNOSIS — Z923 Personal history of irradiation: Secondary | ICD-10-CM | POA: Diagnosis not present

## 2021-12-15 DIAGNOSIS — C3432 Malignant neoplasm of lower lobe, left bronchus or lung: Secondary | ICD-10-CM

## 2021-12-15 DIAGNOSIS — I2510 Atherosclerotic heart disease of native coronary artery without angina pectoris: Secondary | ICD-10-CM | POA: Diagnosis not present

## 2021-12-15 LAB — CBC WITH DIFFERENTIAL (CANCER CENTER ONLY)
Abs Immature Granulocytes: 0.02 10*3/uL (ref 0.00–0.07)
Basophils Absolute: 0 10*3/uL (ref 0.0–0.1)
Basophils Relative: 0 %
Eosinophils Absolute: 0.7 10*3/uL — ABNORMAL HIGH (ref 0.0–0.5)
Eosinophils Relative: 11 %
HCT: 33.6 % — ABNORMAL LOW (ref 39.0–52.0)
Hemoglobin: 11.3 g/dL — ABNORMAL LOW (ref 13.0–17.0)
Immature Granulocytes: 0 %
Lymphocytes Relative: 21 %
Lymphs Abs: 1.3 10*3/uL (ref 0.7–4.0)
MCH: 32.1 pg (ref 26.0–34.0)
MCHC: 33.6 g/dL (ref 30.0–36.0)
MCV: 95.5 fL (ref 80.0–100.0)
Monocytes Absolute: 0.5 10*3/uL (ref 0.1–1.0)
Monocytes Relative: 9 %
Neutro Abs: 3.4 10*3/uL (ref 1.7–7.7)
Neutrophils Relative %: 59 %
Platelet Count: 255 10*3/uL (ref 150–400)
RBC: 3.52 MIL/uL — ABNORMAL LOW (ref 4.22–5.81)
RDW: 12.6 % (ref 11.5–15.5)
WBC Count: 5.9 10*3/uL (ref 4.0–10.5)
nRBC: 0 % (ref 0.0–0.2)

## 2021-12-15 LAB — CMP (CANCER CENTER ONLY)
ALT: 10 U/L (ref 0–44)
AST: 15 U/L (ref 15–41)
Albumin: 3.7 g/dL (ref 3.5–5.0)
Alkaline Phosphatase: 61 U/L (ref 38–126)
Anion gap: 6 (ref 5–15)
BUN: 25 mg/dL — ABNORMAL HIGH (ref 8–23)
CO2: 28 mmol/L (ref 22–32)
Calcium: 9 mg/dL (ref 8.9–10.3)
Chloride: 107 mmol/L (ref 98–111)
Creatinine: 1.33 mg/dL — ABNORMAL HIGH (ref 0.61–1.24)
GFR, Estimated: 53 mL/min — ABNORMAL LOW (ref >60)
Glucose, Bld: 140 mg/dL — ABNORMAL HIGH (ref 70–99)
Potassium: 4 mmol/L (ref 3.5–5.1)
Sodium: 141 mmol/L (ref 135–145)
Total Bilirubin: 0.6 mg/dL (ref 0.3–1.2)
Total Protein: 8 g/dL (ref 6.5–8.1)

## 2021-12-15 MED ORDER — SODIUM CHLORIDE 0.9 % IV SOLN
360.0000 mg | Freq: Once | INTRAVENOUS | Status: AC
  Administered 2021-12-15: 360 mg via INTRAVENOUS
  Filled 2021-12-15: qty 24

## 2021-12-15 MED ORDER — SODIUM CHLORIDE 0.9% FLUSH
10.0000 mL | INTRAVENOUS | Status: DC | PRN
  Administered 2021-12-15: 10 mL

## 2021-12-15 MED ORDER — SODIUM CHLORIDE 0.9 % IV SOLN: Freq: Once | INTRAVENOUS | Status: AC

## 2021-12-15 MED ORDER — HEPARIN SOD (PORK) LOCK FLUSH 100 UNIT/ML IV SOLN
500.0000 [IU] | Freq: Once | INTRAVENOUS | Status: AC | PRN
  Administered 2021-12-15: 500 [IU]

## 2021-12-15 NOTE — Patient Instructions (Signed)
[facility]  Discharge Instructions: Thank you for choosing [facility] to provide your oncology and hematology care.   If you have a lab appointment with the [facility], please go directly to the [facility] and check in at the registration area.   Wear comfortable clothing and clothing appropriate for easy access to any Portacath or PICC line.   We strive to give you quality time with your provider. You may need to reschedule your appointment if you arrive late (15 or more minutes).  Arriving late affects you and other patients whose appointments are after yours.  Also, if you miss three or more appointments without notifying the office, you may be dismissed from the clinic at the provider's discretion.      For prescription refill requests, have your pharmacy contact our office and allow 72 hours for refills to be completed.    Today you received the following chemotherapy and/or immunotherapy agents opdivo      To help prevent nausea and vomiting after your treatment, we encourage you to take your nausea medication as directed.  BELOW ARE SYMPTOMS THAT SHOULD BE REPORTED IMMEDIATELY: *FEVER GREATER THAN 100.4 F (38 C) OR HIGHER *CHILLS OR SWEATING *NAUSEA AND VOMITING THAT IS NOT CONTROLLED WITH YOUR NAUSEA MEDICATION *UNUSUAL SHORTNESS OF BREATH *UNUSUAL BRUISING OR BLEEDING *URINARY PROBLEMS (pain or burning when urinating, or frequent urination) *BOWEL PROBLEMS (unusual diarrhea, constipation, pain near the anus) TENDERNESS IN MOUTH AND THROAT WITH OR WITHOUT PRESENCE OF ULCERS (sore throat, sores in mouth, or a toothache) UNUSUAL RASH, SWELLING OR PAIN  UNUSUAL VAGINAL DISCHARGE OR ITCHING   Items with * indicate a potential emergency and should be followed up as soon as possible or go to the Emergency Department if any problems should occur.  Please show the CHEMOTHERAPY ALERT CARD or IMMUNOTHERAPY ALERT CARD at check-in to the  Emergency Department and triage nurse.  Should you have questions after your visit or need to cancel or reschedule your appointment, please contact [facility]  Dept: [phone]  and follow the prompts.  Office hours are 8:00 a.m. to 4:30 p.m. Monday - Friday. Please note that voicemails left after 4:00 p.m. may not be returned until the following business day.  We are closed weekends and major holidays. You have access to a nurse at all times for urgent questions. Please call the main number to the clinic Dept: [phone] and follow the prompts.   For any non-urgent questions, you may also contact your provider using MyChart. We now offer e-Visits for anyone 41 and older to request care online for non-urgent symptoms. For details visit [email or web address].   Also download the MyChart app! Go to the app store, search "MyChart", open the app, select [facility], and log in with your MyChart username and password.  Masks are optional in the cancer centers. If you would like for your care team to wear a mask while they are taking care of you, please let them know. You may have one support person who is at least 83 years old accompany you for your appointments.

## 2021-12-15 NOTE — Progress Notes (Signed)
This nurse attempted to reach [facility] to verify if a new patient referral has been received. Unable to speak with anyone.  This nurse sent the referral again via fax.  Included Office notes, labs and patient demo.  No further concerns noted at this time.

## 2021-12-15 NOTE — Progress Notes (Signed)
Error

## 2021-12-16 ENCOUNTER — Ambulatory Visit (HOSPITAL_COMMUNITY)
Admission: RE | Admit: 2021-12-16 | Discharge: 2021-12-16 | Disposition: A | Discharge status: Home / Self Care | Payer: Medicare HMO | Source: Ambulatory Visit | Attending: Acute Care | Admitting: Acute Care

## 2021-12-16 DIAGNOSIS — Z930 Tracheostomy status: Secondary | ICD-10-CM

## 2021-12-16 DIAGNOSIS — J383 Other diseases of vocal cords: Secondary | ICD-10-CM | POA: Diagnosis present

## 2021-12-16 DIAGNOSIS — J386 Stenosis of larynx: Secondary | ICD-10-CM | POA: Diagnosis not present

## 2021-12-16 DIAGNOSIS — Z87891 Personal history of nicotine dependence: Secondary | ICD-10-CM

## 2021-12-16 DIAGNOSIS — J398 Other specified diseases of upper respiratory tract: Secondary | ICD-10-CM | POA: Diagnosis not present

## 2021-12-16 NOTE — Progress Notes (Signed)
Tracheostomy Procedure Note  Shaun Kennedy [id number] [birth year]  Pre Procedure Tracheostomy Information  Trach Brand: Shiley Size:  6.0 [id number] Style: Uncuffed Secured by: Velcro   Procedure: Trach Cleaning and Trach Change    Post Procedure Tracheostomy Information  Trach Brand: Shiley Size:  6.0 [id number] Style: Uncuffed Secured by: Velcro   Post Procedure Evaluation:  ETCO2 positive color change from yellow to purple : Yes.   Vital signs:VSS Patients current condition: stable Complications: No apparent complications Trach site exam: clean, dry Wound care done: dry and 4 x 4 gauze drain Patient did tolerate procedure well.   Education: none  Prescription needs: none    Additional needs: New PMV give to patient at this vIsit

## 2021-12-17 NOTE — Progress Notes (Signed)
Reason for visit Planned trach change   HPI 81 year old male patient with subglottic stenosis, stage IV non-small lung cancer, and tracheostomy dependence.  I saw him last 9/19. He continues to follow w/ Onc. He has completed palliative XRT 2021, Last CT imaging raised concern for possible mets to bowel he has just completed cycle 20 of 22 for chemo. Presents today for planned trach change  Review of Systems  All other systems reviewed and are negative.  Exam  General 81 year old male. He is ambulatory and in no distress HENT NCAT trach is midline and phonation is excellent w/ PMV in place Pulm clear Card rrr Abd soft  Ext warm and dry  Neuro intact   Procedure  Trach was removed. The trach stoma was inspected and it was unremarkable. A new size 6 trach was inserted over obturator. The patient tolerated well and the placement was confirmed via ETCO2  Impression/plan  Principal Problem:   Tracheostomy status ([place]) Active Problems:   Former smoker, stopped smoking in distant past   Glottic stenosis   Vocal cord dysfunction   Trachea, stenosis   Tracheostomy status ([place]) Overview:  Trach Brand: Shiley Size:  6.0 [id number] Style: Uncuffed Last change 11/10  Discussion Stable trach, not a candidate for trach decannulation d/t subglottic stenosis  Plan Cont routine trach care ROV 12 weeks     My time 20 min  Erick Colace ACNP-BC [facility] Pager # [phone] OR # [phone] if no answer

## 2021-12-20 ENCOUNTER — Telehealth: Payer: Self-pay | Admitting: Physician Assistant

## 2021-12-20 NOTE — Telephone Encounter (Signed)
Called patient regarding upcoming November and December appointment, patient is notified.

## 2021-12-31 NOTE — Progress Notes (Signed)
[facility] OFFICE PROGRESS NOTE  [name], [address] [place] [address]  DIAGNOSIS:  Stage IV (T3, N2, M1 a) non-small cell lung cancer, squamous cell carcinoma presented with obstructive left lower lobe lung mass in addition to mediastinal lymphadenopathy as well as bilateral pulmonary nodules diagnosed in July 2021.   PDL1: 0%  PRIOR THERAPY: Palliative radiotherapy to the obstructive left lower lobe lung mass under the care of Dr. Lisbeth Renshaw. Completed on 09/20/19    CURRENT THERAPY: Systemic chemotherapy 2 cycles of chemotherapy with carboplatin for an AUC of 5 and paclitaxel 175 mg/m2 in addition to immunotherapy with nivolumab 360 mg every 3 weeks and ipilimumab 1 mg/kg IV every 6 weeks followed by maintenance nivolumab and ipilimumab.  He started the first treatment on 09/04/2019.  He is status post 20 cycles of treatment.    INTERVAL HISTORY: Shaun Kennedy 81 y.o. male returns to the clinic today for a follow up visit. The patient is feeling well today without any concerning complaints. He follows closely with ENT for his tracheostomy.  The patient continues to tolerate treatment with immunotherapy well without any adverse side effects. He is feeling well today. Denies any fever, chills, and night sweats. He reports his breathing is "good". He denies any cough or hemoptysis. He denies any shortness of breath.  Denies any nausea, vomiting, blood in the stool, constipation, or diarrhea, or abdominal pain.  Denies any rashes or skin changes. With his last restaging CT scan, it noted  mural thickening in the distal small bowel, somewhat mass-like in appearance. This is nonspecific but concerning for small bowel metastasis.  Given the position of these lesions, this is unlikely to be accessible by endoscopy or colonoscopy.  He was referred to GI. He is here for evaluation before starting day 1 cycle #21 today.     MEDICAL HISTORY: Past Medical History:   Diagnosis Date   Allergic rhinitis    Asthma    Carotid stenosis    Colon cancer ([facility]) 2003   Diabetes mellitus ([facility])    Diverticulosis    Dyslipidemia    ED (erectile dysfunction)    GERD (gastroesophageal reflux disease)    H/O degenerative disc disease    Hemorrhoids    HTN (hypertension)    as a child   Hyperlipidemia    Lung cancer ([place])    LVH (left ventricular hypertrophy)    on EKG   Mass of lower lobe of left lung    Mediastinal adenopathy    Smoker    former   Wears dentures    Wears dentures    Wears glasses    Wears glasses     ALLERGIES:  has No Known Allergies.  MEDICATIONS:  Current Outpatient Medications  Medication Sig Dispense Refill   Alcohol Swabs (DROPSAFE ALCOHOL PREP) 70 % PADS Apply topically.     amLODipine (NORVASC) 5 MG tablet TAKE 1 TABLET EVERY DAY 90 tablet 3   aspirin EC 81 MG tablet Take 81 mg by mouth daily after breakfast.      betamethasone valerate ointment (VALISONE) 0.1 % APPLY 1 APPLICATION TOPICALLY 2 TIMES DAILY 45 g 0   bisacodyl (DULCOLAX) 5 MG EC tablet Take 10 mg by mouth daily as needed for moderate constipation.     Blood Glucose Calibration (TRUE METRIX LEVEL 1) Low SOLN      Blood Glucose Monitoring Suppl (TRUE METRIX AIR GLUCOSE METER) w/Device KIT USE AS DIRECTED 1 kit 0  Borage, Borago officinalis, (BORAGE OIL PO) Take 2,300 mg by mouth.     cholecalciferol (VITAMIN D) 25 MCG (1000 UNIT) tablet Take 1,000 Units by mouth daily.     donepezil (ARICEPT) 5 MG tablet Take 1 tablet (5 mg total) by mouth at bedtime. 90 tablet 0   dronabinol (MARINOL) 2.5 MG capsule Take 1 capsule (2.5 mg total) by mouth 2 (two) times daily before a meal. 60 capsule 1   ferrous sulfate 325 (65 FE) MG EC tablet TAKE 1 TABLET (325 MG TOTAL) DAILY WITH BREAKFAST. 90 tablet 1   gabapentin (NEURONTIN) 100 MG capsule Take by mouth.     hydrOXYzine (ATARAX) 10 MG tablet Take 1 tablet (10 mg total) by mouth 3 (three) times daily as needed. 90 tablet  1   Lancet Devices (TRUEDRAW LANCING DEVICE) MISC Check sugars daily 90 each 0   linaclotide (LINZESS) 72 MCG capsule Take by mouth.     lisinopril-hydrochlorothiazide (ZESTORETIC) 20-12.5 MG tablet TAKE 1 TABLET EVERY DAY 90 tablet 1   metFORMIN (GLUCOPHAGE-XR) 500 MG 24 hr tablet TAKE 1 TABLET EVERY DAY 90 tablet 0   Multiple Vitamin (MULTIVITAMIN WITH MINERALS) TABS tablet Take 1 tablet by mouth daily after breakfast.      simvastatin (ZOCOR) 40 MG tablet TAKE 1 TABLET EVERY DAY 90 tablet 1   solifenacin (VESICARE) 5 MG tablet TAKE 1 TABLET EVERY DAY 90 tablet 1   TRUE METRIX BLOOD GLUCOSE TEST test strip TEST BLOOD GLUCOSE  UP TO TWO TIMES DAILY 200 strip 2   TRUEplus Lancets 33G MISC 1 each by Does not apply route 2 (two) times daily. 200 each 4   No current facility-administered medications for this visit.   Facility-Administered Medications Ordered in Other Visits  Medication Dose Route Frequency Provider Last Rate Last Admin   diphenhydrAMINE (BENADRYL) injection 25 mg  25 mg Intravenous Once Curt Bears, MD       famotidine (PEPCID) IVPB 20 mg premix  20 mg Intravenous Once Curt Bears, MD       heparin lock flush 100 unit/mL  500 Units Intracatheter Once PRN Curt Bears, MD       ipilimumab (YERVOY) 85 mg in sodium chloride 0.9 % 50 mL chemo infusion  1 mg/kg (Treatment Plan Recorded) Intravenous Once Curt Bears, MD       nivolumab (OPDIVO) 360 mg in sodium chloride 0.9 % 100 mL chemo infusion  360 mg Intravenous Once Curt Bears, MD       sodium chloride flush (NS) 0.9 % injection 10 mL  10 mL Intracatheter PRN Curt Bears, MD        SURGICAL HISTORY:  Past Surgical History:  Procedure Laterality Date   BRONCHIAL BIOPSY  08/20/2019   Procedure: BRONCHIAL BIOPSIES;  Surgeon: Collene Gobble, MD;  [facility] ENDOSCOPY;  Service: Pulmonary;;   BRONCHIAL BRUSHINGS  08/20/2019   Procedure: BRONCHIAL BRUSHINGS;  Surgeon: Collene Gobble, MD;  Location:  [facility] ENDOSCOPY;  Service: Pulmonary;;   BRONCHIAL NEEDLE ASPIRATION BIOPSY  08/20/2019   Procedure: BRONCHIAL NEEDLE ASPIRATION BIOPSIES;  Surgeon: Collene Gobble, MD;  Location: [facility];  Service: Pulmonary;;   CARPAL TUNNEL RELEASE Right 01/09/2019   Procedure: CARPAL TUNNEL RELEASE;  Surgeon: Leandrew Koyanagi, MD;  Location: [facility];  Service: Orthopedics;  Laterality: Right;   CARPAL TUNNEL WITH CUBITAL TUNNEL Right 11/18/2020   Procedure: CARPAL TUNNEL WITH CUBITAL TUNNEL;  Surgeon: Charlotte Crumb, MD;  Location: [facility];  Service: Orthopedics;  Laterality: Right;   CATARACT EXTRACTION Right 2018   COLONOSCOPY  2007   Dr. Benson [place]   DIRECT LARYNGOSCOPY N/A 04/10/2020   Procedure: DIRECT LARYNGOSCOPY;  Surgeon: Melida Quitter, MD;  Location: [facility];  Service: ENT;  Laterality: N/A;   I & D EXTREMITY Right 04/02/2016   Procedure: IRRIGATION AND DEBRIDEMENT GREAT TOE;  Surgeon: Leandrew Koyanagi, MD;  Location: [facility];  Service: Orthopedics;  Laterality: Right;   IR IMAGING GUIDED PORT INSERTION  08/30/2019   MULTIPLE TOOTH EXTRACTIONS     RADIOACTIVE SEED IMPLANT  2003   TRACHEOSTOMY TUBE PLACEMENT N/A 04/10/2020   Procedure: TRACHEOSTOMY;  Surgeon: Melida Quitter, MD;  Location: [name];  Service: ENT;  Laterality: N/A;   ULNAR TUNNEL RELEASE Right 01/09/2019   Procedure: RIGHT CUBITAL TUNNEL RELEASE AND CARPAL TUNNEL RELEASE;  Surgeon: Leandrew Koyanagi, MD;  Location: [facility];  Service: Orthopedics;  Laterality: Right;   UPPER GASTROINTESTINAL ENDOSCOPY     VIDEO BRONCHOSCOPY N/A 12/18/2019   Procedure: VIDEO BRONCHOSCOPY WITHOUT FLUORO;  Surgeon: Collene Gobble, MD;  Location: [facility] ENDOSCOPY;  Service: Cardiopulmonary;  Laterality: N/A;   VIDEO BRONCHOSCOPY WITH ENDOBRONCHIAL ULTRASOUND N/A 08/20/2019   Procedure: VIDEO BRONCHOSCOPY WITH ENDOBRONCHIAL ULTRASOUND;  Surgeon: Collene Gobble, MD;  Location: [facility] ENDOSCOPY;  Service: Pulmonary;  Laterality: N/A;    REVIEW OF  SYSTEMS:   Review of Systems  Constitutional: Negative for appetite change, chills, fatigue, fever and unexpected weight change.  HENT: Negative for mouth sores, nosebleeds, sore throat and trouble swallowing.   Eyes: Negative for eye problems and icterus.  Respiratory: Positive for occasional cough depending on mucus blocking his trach. Positive for mild dyspnea with certain activities. Negative for hemoptysis and wheezing.   Cardiovascular: Negative for chest pain and leg swelling.  Gastrointestinal: Negative for abdominal pain, constipation, diarrhea, nausea and vomiting.  Genitourinary: Negative for bladder incontinence, difficulty urinating, dysuria, frequency and hematuria.   Musculoskeletal: Negative for back pain, gait problem, neck pain and neck stiffness.  Skin: Negative for itching and rash.  Neurological: Negative for dizziness, extremity weakness, gait problem, headaches, light-headedness and seizures.  Hematological: Negative for adenopathy. Does not bruise/bleed easily.  Psychiatric/Behavioral: Negative for confusion, depression and sleep disturbance. The patient is not nervous/anxious.   PHYSICAL EXAMINATION:  Blood pressure (!) 141/66, pulse (!) 55, temperature 98.2 F (36.8 C), temperature source Oral, resp. rate 18, weight 190 lb 11.2 oz (86.5 kg), SpO2 100 %.  ECOG PERFORMANCE STATUS: 1  Physical Exam  Constitutional: Oriented to person, place, and time and well-developed, well-nourished, and in no distress. No distress.  HENT:  Head: Normocephalic and atraumatic.  Mouth/Throat: Trach in place.  Eyes: Conjunctivae are normal. Right eye exhibits no discharge. Left eye exhibits no discharge. No scleral icterus.  Neck: Normal range of motion. Neck supple.  Cardiovascular: Normal rate, regular rhythm, normal heart sounds and intact distal pulses.   Pulmonary/Chest: Effort normal and breath sounds normal. No respiratory distress. No wheezes. No rales. Positive for  rhonchi.  Abdominal: Soft.  Exhibits no distension.There is no tenderness.  Musculoskeletal: Normal range of motion.  Lymphadenopathy:    No cervical adenopathy.  Neurological: Alert and oriented to person, place, and time. Exhibits normal muscle tone. Gait normal. Coordination normal.  Skin: Skin is warm and dry. No rash noted. Not diaphoretic. No erythema. No pallor.  Psychiatric: Mood, memory and judgment normal.  Vitals reviewed.  LABORATORY DATA: Lab Results  Component Value Date   WBC 6.2 01/05/2022  HGB 10.8 (L) 01/05/2022   HCT 32.8 (L) 01/05/2022   MCV 95.1 01/05/2022   PLT 239 01/05/2022      Chemistry      Component Value Date/Time   NA 137 01/05/2022 1345   NA 146 (H) 10/04/2021 1616   K 4.5 01/05/2022 1345   CL 106 01/05/2022 1345   CO2 27 01/05/2022 1345   BUN 24 (H) 01/05/2022 1345   BUN 12 10/04/2021 1616   CREATININE 1.25 (H) 01/05/2022 1345   CREATININE 1.33 (H) 12/15/2021 0808   CREATININE 1.25 (H) 08/08/2016 0912      Component Value Date/Time   CALCIUM 9.5 01/05/2022 1345   ALKPHOS 54 01/05/2022 1345   AST 16 01/05/2022 1345   AST 15 12/15/2021 0808   ALT 11 01/05/2022 1345   ALT 10 12/15/2021 0808   BILITOT 0.6 01/05/2022 1345   BILITOT 0.6 12/15/2021 0808       RADIOGRAPHIC STUDIES:  No results found.   ASSESSMENT/PLAN:  This is a very pleasant 81 year old [name] diagnosed with stage IV (T3, N2, M1a) non-small cell lung cancer, squamous cell carcinoma.  He presented with a left lower lobe obstructive lung mass in addition to mediastinal lymphadenopathy.  He also presented with bilateral pulmonary nodules.  He was diagnosed in July 2021.  His PD-L1 expression is negative.   He completed palliative radiotherapy to the obstructive lung mass under the care of Dr. Lisbeth Renshaw in August 2021.   The patient is currently undergoing systemic chemotherapy with carboplatin and paclitaxel for 2 cycles in addition to immunotherapy with  ipilimumab 1 mg/KG every 6 weeks and nivolumab 360 mg IV every 3 weeks.  He is status post 20 cycles.   With his last restaging CT scan, it noted development of severe mural thickening in the distal small bowel, somewhat mass-like in appearance. This is nonspecific but concerning for small bowel metastasis.  Given the position of these lesions, this is unlikely to be accessible by endoscopy or colonoscopy.  Therefore capsule endoscopy could be considered.   He was referred to GI. He does not have an upcoming appointment. We will follow up on this.    Labs were reviewed. Recommend he proceed with day 1 cycle #21 as scheduled.     We will see him back for follow-up visit in 4 weeks for evaluation before starting the next cycle of treatment.    He will continue to follow with ENT for his tracheostomy.   The patient was advised to call immediately if he has any concerning symptoms in the interval. The patient voices understanding of current disease status and treatment options and is in agreement with the current care plan. All questions were answered. The patient knows to call the clinic with any problems, questions or concerns. We can certainly see the patient much sooner if necessary            No orders of the defined types were placed in this encounter.    he total time spent in the appointment was 20-29 minutes.   Hester Joslin L Aracely Rickett, PA-C 01/05/22

## 2022-01-04 ENCOUNTER — Encounter: Payer: Self-pay | Admitting: Medical Oncology

## 2022-01-04 ENCOUNTER — Telehealth: Payer: Self-pay | Admitting: Medical Oncology

## 2022-01-04 NOTE — Telephone Encounter (Signed)
Trach supply requested -[name] left a message for a "plastic piece"  that Shaun Kennedy needs for his trach . I recommended she contact the trach clinic at [facility].

## 2022-01-05 ENCOUNTER — Ambulatory Visit: Payer: Medicare HMO | Admitting: Internal Medicine

## 2022-01-05 ENCOUNTER — Inpatient Hospital Stay: Payer: Medicare HMO

## 2022-01-05 ENCOUNTER — Inpatient Hospital Stay: Payer: Medicare HMO | Admitting: Physician Assistant

## 2022-01-05 ENCOUNTER — Ambulatory Visit: Payer: Medicare HMO

## 2022-01-05 ENCOUNTER — Encounter: Payer: Self-pay | Admitting: Internal Medicine

## 2022-01-05 ENCOUNTER — Other Ambulatory Visit: Payer: Self-pay

## 2022-01-05 ENCOUNTER — Other Ambulatory Visit: Payer: Medicare HMO

## 2022-01-05 VITALS — BP 141/66 | HR 55 | Temp 98.2°F | Resp 18 | Wt 190.7 lb

## 2022-01-05 DIAGNOSIS — I10 Essential (primary) hypertension: Secondary | ICD-10-CM | POA: Diagnosis not present

## 2022-01-05 DIAGNOSIS — Z7984 Long term (current) use of oral hypoglycemic drugs: Secondary | ICD-10-CM | POA: Diagnosis not present

## 2022-01-05 DIAGNOSIS — I2510 Atherosclerotic heart disease of native coronary artery without angina pectoris: Secondary | ICD-10-CM | POA: Diagnosis not present

## 2022-01-05 DIAGNOSIS — C3432 Malignant neoplasm of lower lobe, left bronchus or lung: Secondary | ICD-10-CM

## 2022-01-05 DIAGNOSIS — E119 Type 2 diabetes mellitus without complications: Secondary | ICD-10-CM | POA: Diagnosis not present

## 2022-01-05 DIAGNOSIS — Z5112 Encounter for antineoplastic immunotherapy: Secondary | ICD-10-CM | POA: Diagnosis not present

## 2022-01-05 DIAGNOSIS — Z79899 Other long term (current) drug therapy: Secondary | ICD-10-CM | POA: Diagnosis not present

## 2022-01-05 DIAGNOSIS — Z95828 Presence of other vascular implants and grafts: Secondary | ICD-10-CM

## 2022-01-05 DIAGNOSIS — Z923 Personal history of irradiation: Secondary | ICD-10-CM | POA: Diagnosis not present

## 2022-01-05 DIAGNOSIS — Z87891 Personal history of nicotine dependence: Secondary | ICD-10-CM | POA: Diagnosis not present

## 2022-01-05 LAB — CBC WITH DIFFERENTIAL (CANCER CENTER ONLY)
Abs Immature Granulocytes: 0.01 10*3/uL (ref 0.00–0.07)
Basophils Absolute: 0 10*3/uL (ref 0.0–0.1)
Basophils Relative: 0 %
Eosinophils Absolute: 0.7 10*3/uL — ABNORMAL HIGH (ref 0.0–0.5)
Eosinophils Relative: 11 %
HCT: 32.8 % — ABNORMAL LOW (ref 39.0–52.0)
Hemoglobin: 10.8 g/dL — ABNORMAL LOW (ref 13.0–17.0)
Immature Granulocytes: 0 %
Lymphocytes Relative: 22 %
Lymphs Abs: 1.3 10*3/uL (ref 0.7–4.0)
MCH: 31.3 pg (ref 26.0–34.0)
MCHC: 32.9 g/dL (ref 30.0–36.0)
MCV: 95.1 fL (ref 80.0–100.0)
Monocytes Absolute: 0.7 10*3/uL (ref 0.1–1.0)
Monocytes Relative: 11 %
Neutro Abs: 3.5 10*3/uL (ref 1.7–7.7)
Neutrophils Relative %: 56 %
Platelet Count: 239 10*3/uL (ref 150–400)
RBC: 3.45 MIL/uL — ABNORMAL LOW (ref 4.22–5.81)
RDW: 12.8 % (ref 11.5–15.5)
WBC Count: 6.2 10*3/uL (ref 4.0–10.5)
nRBC: 0 % (ref 0.0–0.2)

## 2022-01-05 LAB — COMPREHENSIVE METABOLIC PANEL
ALT: 11 U/L (ref 0–44)
AST: 16 U/L (ref 15–41)
Albumin: 3.9 g/dL (ref 3.5–5.0)
Alkaline Phosphatase: 54 U/L (ref 38–126)
Anion gap: 4 — ABNORMAL LOW (ref 5–15)
BUN: 24 mg/dL — ABNORMAL HIGH (ref 8–23)
CO2: 27 mmol/L (ref 22–32)
Calcium: 9.5 mg/dL (ref 8.9–10.3)
Chloride: 106 mmol/L (ref 98–111)
Creatinine, Ser: 1.25 mg/dL — ABNORMAL HIGH (ref 0.61–1.24)
GFR, Estimated: 57 mL/min — ABNORMAL LOW (ref >60)
Glucose, Bld: 89 mg/dL (ref 70–99)
Potassium: 4.5 mmol/L (ref 3.5–5.1)
Sodium: 137 mmol/L (ref 135–145)
Total Bilirubin: 0.6 mg/dL (ref 0.3–1.2)
Total Protein: 8.3 g/dL — ABNORMAL HIGH (ref 6.5–8.1)

## 2022-01-05 MED ORDER — DIPHENHYDRAMINE HCL 50 MG/ML IJ SOLN
25.0000 mg | Freq: Once | INTRAMUSCULAR | Status: AC
  Administered 2022-01-05: 25 mg via INTRAVENOUS
  Filled 2022-01-05: qty 1

## 2022-01-05 MED ORDER — SODIUM CHLORIDE 0.9% FLUSH
10.0000 mL | Freq: Once | INTRAVENOUS | Status: AC
  Administered 2022-01-05: 10 mL

## 2022-01-05 MED ORDER — FAMOTIDINE IN NACL 20-0.9 MG/50ML-% IV SOLN
20.0000 mg | Freq: Once | INTRAVENOUS | Status: AC
  Administered 2022-01-05: 20 mg via INTRAVENOUS
  Filled 2022-01-05: qty 50

## 2022-01-05 MED ORDER — ALTEPLASE 2 MG IJ SOLR
2.0000 mg | Freq: Once | INTRAMUSCULAR | Status: AC
  Administered 2022-01-05: 2 mg
  Filled 2022-01-05: qty 2

## 2022-01-05 MED ORDER — HEPARIN SOD (PORK) LOCK FLUSH 100 UNIT/ML IV SOLN
500.0000 [IU] | Freq: Once | INTRAVENOUS | Status: AC | PRN
  Administered 2022-01-05: 500 [IU]

## 2022-01-05 MED ORDER — SODIUM CHLORIDE 0.9% FLUSH
10.0000 mL | INTRAVENOUS | Status: DC | PRN
  Administered 2022-01-05: 10 mL

## 2022-01-05 MED ORDER — SODIUM CHLORIDE 0.9 % IV SOLN
1.0000 mg/kg | Freq: Once | INTRAVENOUS | Status: AC
  Administered 2022-01-05: 85 mg via INTRAVENOUS
  Filled 2022-01-05: qty 17

## 2022-01-05 MED ORDER — SODIUM CHLORIDE 0.9 % IV SOLN
360.0000 mg | Freq: Once | INTRAVENOUS | Status: AC
  Administered 2022-01-05: 360 mg via INTRAVENOUS
  Filled 2022-01-05: qty 12

## 2022-01-05 MED ORDER — SODIUM CHLORIDE 0.9 % IV SOLN: Freq: Once | INTRAVENOUS | Status: AC

## 2022-01-05 NOTE — Progress Notes (Signed)
Ipilimumab (YERVOY) Patient Monitoring Assessment   Is the patient experiencing any of the following general symptoms?:  [ ] Difficulty performing normal activities [ ] Feeling sluggish or cold all the time [ ] Unusual weight gain [ ] Constant or unusual headaches [ ] Feeling dizzy or faint [ ] Changes in eyesight (blurry vision, double vision, or other vision problems) [ ] Changes in mood or behavior (ex: decreased sex drive, irritability, or forgetfulness) [ ] Starting new medications (ex: steroids, other medications that lower immune response) [X] Patient is not experiencing any of the general symptoms above.   Gastrointestinal  Patient is having 1 bowel movements each day.  Is this different from baseline? [ ] Yes [X] No Are your stools watery or do they have Shaun foul smell? [ ] Yes [X] No Have you seen blood in your stools? [ ] Yes [X] No Are your stools dark, tarry, or sticky? [ ] Yes [X] No Are you having pain or tenderness in your belly? [ ] Yes [X] No  Skin Does your skin itch? [ ] Yes [X] No Do you have Shaun rash? [ ] Yes [X] No Has your skin blistered and/or peeled? [ ] Yes [X] No Do you have sores in your mouth? [ ] Yes [X] No  Hepatic Has your urine been dark or tea colored? [ ] Yes [X] No Have you noticed that your skin or the whites of your eyes are turning yellow? [ ] Yes [X] No Are you bleeding or bruising more easily than normal? [ ] Yes [X] No Are you nauseous and/or vomiting? [ ] Yes [X] No Do you have pain on the right side of your stomach? [ ] Yes [X] No  Neurologic  Are you having unusual weakness of legs, arms, or face? [ ] Yes [X] No Are you having numbness or tingling in your hands or feet? [X] Yes [ ] No Right hand tingling  Shaun Kennedy Shaun Kennedy

## 2022-01-05 NOTE — Patient Instructions (Signed)
[facility]COL[facility]  Discharge Instructions: Thank you for choosing [facility] to provide your oncology and hematology care.   If you have a lab appointment with the [facility], please go directly to the [facility] and check in at the registration area.   Wear comfortable clothing and clothing appropriate for easy access to any Portacath or PICC line.   We strive to give you quality time with your provider. You may need to reschedule your appointment if you arrive late (15 or more minutes).  Arriving late affects you and other patients whose appointments are after yours.  Also, if you miss three or more appointments without notifying the office, you may be dismissed from the clinic at the provider's discretion.      For prescription refill requests, have your pharmacy contact our office and allow 72 hours for refills to be completed.    Today you received the following chemotherapy and/or immunotherapy agents opdivo & Yervoy     To help prevent nausea and vomiting after your treatment, we encourage you to take your nausea medication as directed.  BELOW ARE SYMPTOMS THAT SHOULD BE REPORTED IMMEDIATELY: *FEVER GREATER THAN 100.4 F (38 C) OR HIGHER *CHILLS OR SWEATING *NAUSEA AND VOMITING THAT IS NOT CONTROLLED WITH YOUR NAUSEA MEDICATION *UNUSUAL SHORTNESS OF BREATH *UNUSUAL BRUISING OR BLEEDING *URINARY PROBLEMS (pain or burning when urinating, or frequent urination) *BOWEL PROBLEMS (unusual diarrhea, constipation, pain near the anus) TENDERNESS IN MOUTH AND THROAT WITH OR WITHOUT PRESENCE OF ULCERS (sore throat, sores in mouth, or a toothache) UNUSUAL RASH, SWELLING OR PAIN  UNUSUAL VAGINAL DISCHARGE OR ITCHING   Items with * indicate a potential emergency and should be followed up as soon as possible or go to the Emergency Department if any problems should occur.  Please show the CHEMOTHERAPY ALERT CARD or IMMUNOTHERAPY ALERT CARD at check-in  to the Emergency Department and triage nurse.  Should you have questions after your visit or need to cancel or reschedule your appointment, please contact [facility]  Dept: [phone]  and follow the prompts.  Office hours are 8:00 a.m. to 4:30 p.m. Monday - Friday. Please note that voicemails left after 4:00 p.m. may not be returned until the following business day.  We are closed weekends and major holidays. You have access to a nurse at all times for urgent questions. Please call the main number to the clinic Dept: [phone] and follow the prompts.   For any non-urgent questions, you may also contact your provider using MyChart. We now offer e-Visits for anyone 68 and older to request care online for non-urgent symptoms. For details visit [email or web address].   Also download the MyChart app! Go to the app store, search "MyChart", open the app, select [facility], and log in with your MyChart username and password.  Masks are optional in the cancer centers. If you would like for your care team to wear a mask while they are taking care of you, please let them know. You may have one support Shaun Kennedy who is at least 83 years old accompany you for your appointments.

## 2022-01-05 NOTE — Progress Notes (Signed)
Rescheduled 01/17/22  [place]  Direct Dial: [phone]

## 2022-01-11 ENCOUNTER — Encounter: Payer: Self-pay | Admitting: Family Medicine

## 2022-01-11 DIAGNOSIS — C3432 Malignant neoplasm of lower lobe, left bronchus or lung: Secondary | ICD-10-CM

## 2022-01-17 ENCOUNTER — Ambulatory Visit: Payer: Self-pay

## 2022-01-17 NOTE — Patient Outreach (Signed)
  Care Coordination   01/17/2022 Name: Shaun Kennedy MRN: [id number] DOB: 1940-03-16   Care Coordination Outreach Attempts:  An unsuccessful telephone outreach was attempted today to offer the patient information about available care coordination services as a benefit of their health plan.   Follow Up Plan:  Additional outreach attempts will be made to offer the patient care coordination information and services.   Encounter Outcome:  Pt. Request to Call Back   Care Coordination Interventions:  No, not indicated    Barb Merino, RN, BSN, CCM Care Management Coordinator [facility] Direct Phone: [phone]

## 2022-01-18 ENCOUNTER — Telehealth: Payer: Self-pay | Admitting: Family Medicine

## 2022-01-18 ENCOUNTER — Ambulatory Visit (INDEPENDENT_AMBULATORY_CARE_PROVIDER_SITE_OTHER): Payer: Medicare HMO | Admitting: Family Medicine

## 2022-01-18 ENCOUNTER — Encounter: Payer: Self-pay | Admitting: Family Medicine

## 2022-01-18 VITALS — BP 112/74 | HR 64 | Temp 98.1°F | Wt 188.6 lb

## 2022-01-18 DIAGNOSIS — Z23 Encounter for immunization: Secondary | ICD-10-CM

## 2022-01-18 DIAGNOSIS — C3432 Malignant neoplasm of lower lobe, left bronchus or lung: Secondary | ICD-10-CM

## 2022-01-18 NOTE — Telephone Encounter (Signed)
[facility] called t# [phone] needs orders for Skilled nursing 1 week 4, helping with medication management, PT 1 week 1, Occupational therapy 1 week 1 & social worker to eval for resources

## 2022-01-18 NOTE — Progress Notes (Signed)
   Subjective:    Patient ID: Shaun Kennedy, male    DOB: Jun 29, 1940, 81 y.o.   MRN: [id number]  HPI He is here for an interval evaluation.  There was a question of whether he needed help at home with his underlying lung cancer.  He is followed on a every 68-month basis by Dr. Julien Nordmann and also goes to the trach clinic regularly.  He states that he can handle all of his ADLs on his own.  Physically he is quite active.  He does play golf 3 times per week.   Review of Systems     Objective:   Physical Exam Alert and in no distress.  Cardiac exam difficult to hear due to background harsh breath sounds.       Assessment & Plan:  Need for influenza vaccination - Plan: Flu vaccine HIGH DOSE PF (Fluzone High dose)  Malignant neoplasm of lower lobe of left lung ([place]) At this point I see no need for any help with home care for him.

## 2022-01-20 NOTE — Telephone Encounter (Signed)
I called [facility] home [facility] and advised them of Dr. Lanice Shirts message below.

## 2022-01-20 NOTE — Telephone Encounter (Signed)
Does Dr. Redmond School want to cancel the Home Health Referral?

## 2022-01-24 ENCOUNTER — Ambulatory Visit (HOSPITAL_COMMUNITY)
Admission: RE | Admit: 2022-01-24 | Discharge: 2022-01-24 | Disposition: A | Discharge status: Home / Self Care | Payer: Medicare HMO | Source: Ambulatory Visit | Attending: Acute Care | Admitting: Acute Care

## 2022-01-24 ENCOUNTER — Other Ambulatory Visit: Payer: Self-pay | Admitting: Acute Care

## 2022-01-24 DIAGNOSIS — J386 Stenosis of larynx: Secondary | ICD-10-CM | POA: Insufficient documentation

## 2022-01-24 DIAGNOSIS — C3432 Malignant neoplasm of lower lobe, left bronchus or lung: Secondary | ICD-10-CM | POA: Diagnosis not present

## 2022-01-24 DIAGNOSIS — C3490 Malignant neoplasm of unspecified part of unspecified bronchus or lung: Secondary | ICD-10-CM | POA: Insufficient documentation

## 2022-01-24 DIAGNOSIS — R042 Hemoptysis: Secondary | ICD-10-CM | POA: Diagnosis present

## 2022-01-24 DIAGNOSIS — Z930 Tracheostomy status: Secondary | ICD-10-CM

## 2022-01-24 DIAGNOSIS — Z923 Personal history of irradiation: Secondary | ICD-10-CM | POA: Diagnosis not present

## 2022-01-24 DIAGNOSIS — R059 Cough, unspecified: Secondary | ICD-10-CM | POA: Diagnosis not present

## 2022-01-24 MED ORDER — AMOXICILLIN-POT CLAVULANATE 875-125 MG PO TABS: 1.0000 | ORAL_TABLET | Freq: Two times a day (BID) | ORAL | Status: AC

## 2022-01-24 NOTE — Progress Notes (Addendum)
Tracheostomy Procedure Note  Shaun Kennedy [id number] [birth year]  Pre Procedure Tracheostomy Information  Trach Brand: Shiley Size:  6.0 [id number] Style: Uncuffed Secured by: Velcro   Procedure: [name] change and Bronchoscopy   Bronchoscopy done for observation only patient c/o blood in trach  and bloody secretions   Post Procedure Tracheostomy Information  Trach Brand: Shiley Size:  6.0 [id number] Style: Uncuffed Secured by: Velcro   Post Procedure Evaluation:  ETCO2 positive color change from yellow to purple : Yes.   Vital signs:VSS Patients current condition: stable Complications: No apparent complications Trach site exam: clean, dry Wound care done: 4 x 4 gauze drain Patient did tolerate procedure well.   Education: none  Prescription needs: none    Additional needs: none

## 2022-01-24 NOTE — Progress Notes (Signed)
Reason for visit Planned trach change   HPI 81 year old male patient with subglottic stenosis, stage IV non-small lung cancer, and tracheostomy dependence.  I saw him last 9/19. He continues to follow w/ Onc. He has completed palliative XRT 2021, Last CT imaging raised concern for possible mets to bowel he has just completed cycle 20 of 22 for chemo. Presents today for planned trach change  Review of Systems  Constitutional:  Negative for chills and fever.  HENT:         Coughing up blood, this is new.  Eyes: Negative.   Respiratory:  Positive for cough, hemoptysis and sputum production. Negative for shortness of breath.   Cardiovascular:  Negative for chest pain and leg swelling.  Gastrointestinal: Negative.   Genitourinary: Negative.   Musculoskeletal: Negative.   Skin: Negative.   Endo/Heme/Allergies: Negative.   All other systems reviewed and are negative.  Exam   General 81 year old male patient ambulatory, currently in no acute distress HEENT normocephalic Atraumatic tracheostomy is midline Pulmonary: Coarse scattered rhonchi Cardiac: Regular rate and rhythm Abdomen: Soft nontender Extremity: Warm dry and excited neuro: Awake oriented  Procedure   Upper airway exam with fiberoptic bronchoscopy.  There is no notable lesions noted from level of tip of tracheostomy down to carina.  There was fairly significant erythema, at the level of the carina I did not examine further as patient was not sedated New #6 tracheostomy placed, patient tolerated well placement verified via end-tidal CO2  Impression/plan  Active Problems:   Tracheostomy status (HCC)   Malignant neoplasm of lower lobe of left lung (HCC)   Cough with hemoptysis   Tracheostomy status (HCC) Overview:  Trach Brand: Shiley Size:  6.0 [id number] Style: Uncuffed Last change 12/18  Discussion Stable trach, not a candidate for trach decannulation d/t subglottic stenosis  Plan Cont routine trach care ROV 12  weeks   Cough with hemoptysis Overview: Etiology not clear.  Most likely this is URI or tracheobronchitis versus pneumonia.  No fevers however.  Alternatively given his underlying malignancy need to also consider this is alternative explanation Plan Starting Augmentin twice daily x 7 days He has follow-up with Dr. Earlie Server on the 20th, if he is not better at that point we may need to consider repeat chest imaging sooner rather than later      My time 20 min  Erick Colace ACNP-BC [place] Pager # [phone] OR # [phone] if no answer

## 2022-01-25 ENCOUNTER — Other Ambulatory Visit: Payer: Self-pay | Admitting: Medical Oncology

## 2022-01-25 DIAGNOSIS — Z5112 Encounter for antineoplastic immunotherapy: Secondary | ICD-10-CM

## 2022-01-26 ENCOUNTER — Inpatient Hospital Stay: Payer: Medicare HMO | Attending: Internal Medicine

## 2022-01-26 ENCOUNTER — Inpatient Hospital Stay (HOSPITAL_BASED_OUTPATIENT_CLINIC_OR_DEPARTMENT_OTHER): Payer: Medicare HMO | Admitting: Internal Medicine

## 2022-01-26 ENCOUNTER — Other Ambulatory Visit: Payer: Medicare HMO

## 2022-01-26 ENCOUNTER — Other Ambulatory Visit: Payer: Self-pay

## 2022-01-26 ENCOUNTER — Inpatient Hospital Stay: Payer: Medicare HMO

## 2022-01-26 VITALS — BP 139/75 | HR 75 | Resp 18

## 2022-01-26 VITALS — BP 131/59 | HR 57 | Temp 97.4°F | Resp 17 | Wt 191.6 lb

## 2022-01-26 DIAGNOSIS — Z85038 Personal history of other malignant neoplasm of large intestine: Secondary | ICD-10-CM | POA: Insufficient documentation

## 2022-01-26 DIAGNOSIS — C3432 Malignant neoplasm of lower lobe, left bronchus or lung: Secondary | ICD-10-CM

## 2022-01-26 DIAGNOSIS — Z5112 Encounter for antineoplastic immunotherapy: Secondary | ICD-10-CM | POA: Diagnosis not present

## 2022-01-26 DIAGNOSIS — C3490 Malignant neoplasm of unspecified part of unspecified bronchus or lung: Secondary | ICD-10-CM | POA: Diagnosis not present

## 2022-01-26 DIAGNOSIS — I10 Essential (primary) hypertension: Secondary | ICD-10-CM | POA: Insufficient documentation

## 2022-01-26 DIAGNOSIS — Z87891 Personal history of nicotine dependence: Secondary | ICD-10-CM | POA: Insufficient documentation

## 2022-01-26 DIAGNOSIS — Z79899 Other long term (current) drug therapy: Secondary | ICD-10-CM | POA: Insufficient documentation

## 2022-01-26 DIAGNOSIS — Z95828 Presence of other vascular implants and grafts: Secondary | ICD-10-CM

## 2022-01-26 DIAGNOSIS — Z7984 Long term (current) use of oral hypoglycemic drugs: Secondary | ICD-10-CM | POA: Diagnosis not present

## 2022-01-26 DIAGNOSIS — J45909 Unspecified asthma, uncomplicated: Secondary | ICD-10-CM | POA: Diagnosis not present

## 2022-01-26 DIAGNOSIS — Z7982 Long term (current) use of aspirin: Secondary | ICD-10-CM | POA: Diagnosis not present

## 2022-01-26 DIAGNOSIS — E785 Hyperlipidemia, unspecified: Secondary | ICD-10-CM | POA: Insufficient documentation

## 2022-01-26 DIAGNOSIS — E119 Type 2 diabetes mellitus without complications: Secondary | ICD-10-CM | POA: Diagnosis not present

## 2022-01-26 LAB — CMP (CANCER CENTER ONLY)
ALT: 14 U/L (ref 0–44)
AST: 19 U/L (ref 15–41)
Albumin: 3.3 g/dL — ABNORMAL LOW (ref 3.5–5.0)
Alkaline Phosphatase: 50 U/L (ref 38–126)
Anion gap: 5 (ref 5–15)
BUN: 20 mg/dL (ref 8–23)
CO2: 27 mmol/L (ref 22–32)
Calcium: 8.8 mg/dL — ABNORMAL LOW (ref 8.9–10.3)
Chloride: 106 mmol/L (ref 98–111)
Creatinine: 1.46 mg/dL — ABNORMAL HIGH (ref 0.61–1.24)
GFR, Estimated: 47 mL/min — ABNORMAL LOW (ref >60)
Glucose, Bld: 82 mg/dL (ref 70–99)
Potassium: 3.8 mmol/L (ref 3.5–5.1)
Sodium: 138 mmol/L (ref 135–145)
Total Bilirubin: 0.9 mg/dL (ref 0.3–1.2)
Total Protein: 7.6 g/dL (ref 6.5–8.1)

## 2022-01-26 LAB — CBC WITH DIFFERENTIAL (CANCER CENTER ONLY)
Abs Immature Granulocytes: 0.01 10*3/uL (ref 0.00–0.07)
Basophils Absolute: 0 10*3/uL (ref 0.0–0.1)
Basophils Relative: 0 %
Eosinophils Absolute: 0.8 10*3/uL — ABNORMAL HIGH (ref 0.0–0.5)
Eosinophils Relative: 13 %
HCT: 31 % — ABNORMAL LOW (ref 39.0–52.0)
Hemoglobin: 10.7 g/dL — ABNORMAL LOW (ref 13.0–17.0)
Immature Granulocytes: 0 %
Lymphocytes Relative: 19 %
Lymphs Abs: 1.1 10*3/uL (ref 0.7–4.0)
MCH: 31.8 pg (ref 26.0–34.0)
MCHC: 34.5 g/dL (ref 30.0–36.0)
MCV: 92.3 fL (ref 80.0–100.0)
Monocytes Absolute: 0.5 10*3/uL (ref 0.1–1.0)
Monocytes Relative: 9 %
Neutro Abs: 3.5 10*3/uL (ref 1.7–7.7)
Neutrophils Relative %: 59 %
Platelet Count: 250 10*3/uL (ref 150–400)
RBC: 3.36 MIL/uL — ABNORMAL LOW (ref 4.22–5.81)
RDW: 12.7 % (ref 11.5–15.5)
WBC Count: 6 10*3/uL (ref 4.0–10.5)
nRBC: 0 % (ref 0.0–0.2)

## 2022-01-26 LAB — TSH: TSH: 1.303 u[IU]/mL (ref 0.350–4.500)

## 2022-01-26 MED ORDER — SODIUM CHLORIDE 0.9 % IV SOLN
360.0000 mg | Freq: Once | INTRAVENOUS | Status: AC
  Administered 2022-01-26: 360 mg via INTRAVENOUS
  Filled 2022-01-26: qty 24

## 2022-01-26 MED ORDER — SODIUM CHLORIDE 0.9% FLUSH
10.0000 mL | Freq: Once | INTRAVENOUS | Status: AC
  Administered 2022-01-26: 10 mL

## 2022-01-26 MED ORDER — SODIUM CHLORIDE 0.9 % IV SOLN: Freq: Once | INTRAVENOUS | Status: AC

## 2022-01-26 MED ORDER — SODIUM CHLORIDE 0.9% FLUSH
10.0000 mL | INTRAVENOUS | Status: DC | PRN
  Administered 2022-01-26: 10 mL

## 2022-01-26 MED ORDER — HEPARIN SOD (PORK) LOCK FLUSH 100 UNIT/ML IV SOLN
500.0000 [IU] | Freq: Once | INTRAVENOUS | Status: AC | PRN
  Administered 2022-01-26: 500 [IU]

## 2022-01-26 NOTE — Progress Notes (Signed)
[facility] Telephone:([phone]) [phone]   [phone]  OFFICE PROGRESS NOTE  Shaun Kennedy, [name] [address]  DIAGNOSIS: stage IV (T3, N2, M1 a) non-small cell Kennedy cancer, squamous cell carcinoma presented with obstructive left lower lobe Kennedy mass in addition to mediastinal lymphadenopathy as well as bilateral pulmonary nodules diagnosed in July 2021.   PDL1: 0%   PRIOR THERAPY: None   CURRENT THERAPY: 1) Palliative radiotherapy to the obstructive left lower lobe Kennedy mass under the care of Dr. Lisbeth Renshaw.  2)  systemic chemotherapy 2 cycles of chemotherapy with carboplatin for an AUC of 5 and paclitaxel 175 mg/m2 in addition to immunotherapy with nivolumab 360 mg every 3 weeks and ipilimumab 1 mg/kg IV every 6 weeks followed by maintenance nivolumab and ipilimumab.  He started the first treatment on 09/04/2019.  He is status post 20 cycles.  He is here today for day 22 of cycle #21  INTERVAL HISTORY: Shaun Kennedy 81 y.o. male returns to the clinic today for follow-up visit.  The patient is feeling fine today with no concerning complaints.  He had 1 episode of bleeding from the tracheostomy tube and he contacted his ENT physician for recommendation.  It is probably from the dryness of the tube with the heating at home.  He denied having any chest pain, shortness of breath except with exertion with no cough or hemoptysis.  He has no nausea, vomiting, diarrhea or constipation.  He has no headache or visual changes.  He denied having any significant weight loss or night sweats.  He is here today for evaluation before starting day 22 of cycle #21.  MEDICAL HISTORY: Past Medical History:  Diagnosis Date   Allergic rhinitis    Asthma    Carotid stenosis    Colon cancer ([place]) 2003   Diabetes mellitus ([place])    Diverticulosis    Dyslipidemia    ED (erectile dysfunction)    GERD (gastroesophageal reflux disease)    H/O degenerative disc  disease    Hemorrhoids    HTN (hypertension)    as a child   Hyperlipidemia    Kennedy cancer ( [place])    LVH (left ventricular hypertrophy)    on EKG   Mass of lower lobe of left Kennedy    Mediastinal adenopathy    Smoker    former   Wears dentures    Wears dentures    Wears glasses    Wears glasses     ALLERGIES:  has No Known Allergies.  MEDICATIONS:  Current Outpatient Medications  Medication Sig Dispense Refill   Alcohol Swabs (DROPSAFE ALCOHOL PREP) 70 % PADS Apply topically.     amLODipine (NORVASC) 5 MG tablet TAKE 1 TABLET EVERY DAY 90 tablet 3   aspirin EC 81 MG tablet Take 81 mg by mouth daily after breakfast.      betamethasone valerate ointment (VALISONE) 0.1 % APPLY 1 APPLICATION TOPICALLY 2 TIMES DAILY 45 g 0   bisacodyl (DULCOLAX) 5 MG EC tablet Take 10 mg by mouth daily as needed for moderate constipation.     Blood Glucose Calibration (TRUE METRIX LEVEL 1) Low SOLN      Blood Glucose Monitoring Suppl (TRUE METRIX AIR GLUCOSE METER) w/Device KIT USE AS DIRECTED 1 kit 0   Borage, Borago officinalis, (BORAGE OIL PO) Take 2,300 mg by mouth.     cholecalciferol (VITAMIN D) 25 MCG (1000 UNIT) tablet Take 1,000 Units by mouth  daily.     donepezil (ARICEPT) 5 MG tablet Take 1 tablet (5 mg total) by mouth at bedtime. 90 tablet 0   dronabinol (MARINOL) 2.5 MG capsule Take 1 capsule (2.5 mg total) by mouth 2 (two) times daily before a meal. 60 capsule 1   ferrous sulfate 325 (65 FE) MG EC tablet TAKE 1 TABLET (325 MG TOTAL) DAILY WITH BREAKFAST. 90 tablet 1   gabapentin (NEURONTIN) 100 MG capsule Take by mouth. (Patient not taking: Reported on 01/18/2022)     hydrOXYzine (ATARAX) 10 MG tablet Take 1 tablet (10 mg total) by mouth 3 (three) times daily as needed. 90 tablet 1   Lancet Devices (TRUEDRAW LANCING DEVICE) MISC Check sugars daily 90 each 0   linaclotide (LINZESS) 72 MCG capsule Take by mouth.     lisinopril-hydrochlorothiazide (ZESTORETIC) 20-12.5 MG tablet TAKE 1  TABLET EVERY DAY 90 tablet 1   metFORMIN (GLUCOPHAGE-XR) 500 MG 24 hr tablet TAKE 1 TABLET EVERY DAY 90 tablet 0   Multiple Vitamin (MULTIVITAMIN WITH MINERALS) TABS tablet Take 1 tablet by mouth daily after breakfast.      simvastatin (ZOCOR) 40 MG tablet TAKE 1 TABLET EVERY DAY 90 tablet 1   solifenacin (VESICARE) 5 MG tablet TAKE 1 TABLET EVERY DAY 90 tablet 1   TRUE METRIX BLOOD GLUCOSE TEST test strip TEST BLOOD GLUCOSE  UP TO TWO TIMES DAILY 200 strip 2   TRUEplus Lancets 33G MISC 1 each by Does not apply route 2 (two) times daily. 200 each 4   Current Facility-Administered Medications  Medication Dose Route Frequency Provider Last Rate Last Admin   amoxicillin-clavulanate (AUGMENTIN) 875-125 MG per tablet 1 tablet  1 tablet Oral Q12H Erick Colace, NP        SURGICAL HISTORY:  Past Surgical History:  Procedure Laterality Date   BRONCHIAL BIOPSY  08/20/2019   Procedure: BRONCHIAL BIOPSIES;  Surgeon: Collene Gobble, MD;  Location: [facility] ENDOSCOPY;  Service: Pulmonary;;   BRONCHIAL BRUSHINGS  08/20/2019   Procedure: BRONCHIAL BRUSHINGS;  Surgeon: Collene Gobble, MD;  Location: [facility] ENDOSCOPY;  Service: Pulmonary;;   BRONCHIAL NEEDLE ASPIRATION BIOPSY  08/20/2019   Procedure: BRONCHIAL NEEDLE ASPIRATION BIOPSIES;  Surgeon: Collene Gobble, MD;  Location: [facility] ENDOSCOPY;  Service: Pulmonary;;   CARPAL TUNNEL RELEASE Right 01/09/2019   Procedure: CARPAL TUNNEL RELEASE;  Surgeon: Leandrew Koyanagi, MD;  Location: [facility];  Service: Orthopedics;  Laterality: Right;   CARPAL TUNNEL WITH CUBITAL TUNNEL Right 11/18/2020   Procedure: CARPAL TUNNEL WITH CUBITAL TUNNEL;  Surgeon: Charlotte Crumb, MD;  Location: [facility];  Service: Orthopedics;  Laterality: Right;   CATARACT EXTRACTION Right 2018   COLONOSCOPY  2007   Dr. Benson Norway   DIRECT LARYNGOSCOPY N/A 04/10/2020   Procedure: DIRECT LARYNGOSCOPY;  Surgeon: Melida Quitter, MD;  Location: [facility];  Service: ENT;  Laterality: N/A;   I & D  EXTREMITY Right 04/02/2016   Procedure: IRRIGATION AND DEBRIDEMENT GREAT TOE;  Surgeon: Leandrew Koyanagi, MD;  Location: [facility];  Service: Orthopedics;  Laterality: Right;   IR IMAGING GUIDED PORT INSERTION  08/30/2019   MULTIPLE TOOTH EXTRACTIONS     RADIOACTIVE SEED IMPLANT  2003   TRACHEOSTOMY TUBE PLACEMENT N/A 04/10/2020   Procedure: TRACHEOSTOMY;  Surgeon: Melida Quitter, MD;  Location: [facility];  Service: ENT;  Laterality: N/A;   ULNAR TUNNEL RELEASE Right 01/09/2019   Procedure: RIGHT CUBITAL TUNNEL RELEASE AND CARPAL TUNNEL RELEASE;  Surgeon: Leandrew Koyanagi, MD;  Location: [facility];  Service: Orthopedics;  Laterality: Right;   UPPER GASTROINTESTINAL ENDOSCOPY     VIDEO BRONCHOSCOPY N/A 12/18/2019   Procedure: VIDEO BRONCHOSCOPY WITHOUT FLUORO;  Surgeon: Collene Gobble, MD;  Location: [facility] ENDOSCOPY;  Service: Cardiopulmonary;  Laterality: N/A;   VIDEO BRONCHOSCOPY WITH ENDOBRONCHIAL ULTRASOUND N/A 08/20/2019   Procedure: VIDEO BRONCHOSCOPY WITH ENDOBRONCHIAL ULTRASOUND;  Surgeon: Collene Gobble, MD;  Location: [facility] ENDOSCOPY;  Service: Pulmonary;  Laterality: N/A;    REVIEW OF SYSTEMS:  A comprehensive review of systems was negative except for: Respiratory: positive for dyspnea on exertion   PHYSICAL EXAMINATION: General appearance: alert, cooperative, and no distress Head: Normocephalic, without obvious abnormality, atraumatic Neck: no adenopathy, no JVD, supple, symmetrical, trachea midline, and thyroid not enlarged, symmetric, no tenderness/mass/nodules Lymph nodes: Cervical, supraclavicular, and axillary nodes normal. Resp: clear to auscultation bilaterally Back: symmetric, no curvature. ROM normal. No CVA tenderness. Cardio: regular rate and rhythm, S1, S2 normal, no murmur, click, rub or gallop GI: soft, non-tender; bowel sounds normal; no masses,  no organomegaly Extremities: extremities normal, atraumatic, no cyanosis or edema  ECOG PERFORMANCE STATUS: 1 - Symptomatic but  completely ambulatory  Blood pressure (!) 131/59, pulse (!) 57, temperature (!) 97.4 F (36.3 C), temperature source Temporal, resp. rate 17, weight 191 lb 9 oz (86.9 kg), SpO2 100 %.  LABORATORY DATA: Lab Results  Component Value Date   WBC 6.0 01/26/2022   HGB 10.7 (L) 01/26/2022   HCT 31.0 (L) 01/26/2022   MCV 92.3 01/26/2022   PLT 250 01/26/2022      Chemistry      Component Value Date/Time   NA 137 01/05/2022 1345   NA 146 (H) 10/04/2021 1616   K 4.5 01/05/2022 1345   CL 106 01/05/2022 1345   CO2 27 01/05/2022 1345   BUN 24 (H) 01/05/2022 1345   BUN 12 10/04/2021 1616   CREATININE 1.25 (H) 01/05/2022 1345   CREATININE 1.33 (H) 12/15/2021 0808   CREATININE 1.25 (H) 08/08/2016 0912      Component Value Date/Time   CALCIUM 9.5 01/05/2022 1345   ALKPHOS 54 01/05/2022 1345   AST 16 01/05/2022 1345   AST 15 12/15/2021 0808   ALT 11 01/05/2022 1345   ALT 10 12/15/2021 0808   BILITOT 0.6 01/05/2022 1345   BILITOT 0.6 12/15/2021 0808       RADIOGRAPHIC STUDIES: No results found.  ASSESSMENT AND PLAN: This is a very pleasant 81 years old [name] with stage IV non-small cell Kennedy cancer, squamous cell carcinoma with negative PD-L1 expression. The patient is currently undergoing systemic chemotherapy with carboplatin and paclitaxel for 2 cycles in addition to immunotherapy with ipilimumab 1 mg/KG every 6 weeks and nivolumab 360 mg IV every 3 weeks status post 20 cycles. The patient has been tolerating his treatment well with no concerning adverse effects. I recommended for him to proceed with day #22 of cycle 21 today as planned. I will see him back for follow-up visit in 3 weeks for evaluation before starting cycle #22. He was advised to call immediately if he has any other concerning symptoms in the interval. The patient voices understanding of current disease status and treatment options and is in agreement with the current care plan.  All questions were  answered. The patient knows to call the clinic with any problems, questions or concerns. We can certainly see the patient much sooner if necessary.  Disclaimer: This note was dictated with voice recognition software. Similar sounding  words can inadvertently be transcribed and may not be corrected upon review.

## 2022-01-26 NOTE — Patient Instructions (Signed)
[facility] MEDICAL ONCOLOGY  Discharge Instructions: Thank you for choosing [facility] to provide your oncology and hematology care.   If you have a lab appointment with the Cancer Center, please go directly to the Cancer Center and check in at the registration area.   Wear comfortable clothing and clothing appropriate for easy access to any Portacath or PICC line.   We strive to give you quality time with your provider. You may need to reschedule your appointment if you arrive late (15 or more minutes).  Arriving late affects you and other patients whose appointments are after yours.  Also, if you miss three or more appointments without notifying the office, you may be dismissed from the clinic at the provider's discretion.      For prescription refill requests, have your pharmacy contact our office and allow 72 hours for refills to be completed.    Today you received the following chemotherapy and/or immunotherapy agents: Opdivo      To help prevent nausea and vomiting after your treatment, we encourage you to take your nausea medication as directed.  BELOW ARE SYMPTOMS THAT SHOULD BE REPORTED IMMEDIATELY: *FEVER GREATER THAN 100.4 F (38 C) OR HIGHER *CHILLS OR SWEATING *NAUSEA AND VOMITING THAT IS NOT CONTROLLED WITH YOUR NAUSEA MEDICATION *UNUSUAL SHORTNESS OF BREATH *UNUSUAL BRUISING OR BLEEDING *URINARY PROBLEMS (pain or burning when urinating, or frequent urination) *BOWEL PROBLEMS (unusual diarrhea, constipation, pain near the anus) TENDERNESS IN MOUTH AND THROAT WITH OR WITHOUT PRESENCE OF ULCERS (sore throat, sores in mouth, or a toothache) UNUSUAL RASH, SWELLING OR PAIN  UNUSUAL VAGINAL DISCHARGE OR ITCHING   Items with * indicate a potential emergency and should be followed up as soon as possible or go to the Emergency Department if any problems should occur.  Please show the CHEMOTHERAPY ALERT CARD or IMMUNOTHERAPY ALERT CARD at check-in to the  Emergency Department and triage nurse.  Should you have questions after your visit or need to cancel or reschedule your appointment, please contact [facility] MEDICAL [facility]COL[facility]  Dept: [phone]  and follow the prompts.  Office hours are 8:00 a.m. to 4:30 p.m. Monday - Friday. Please note that voicemails left after 4:00 p.m. may not be returned until the following business day.  We are closed weekends and major holidays. You have access to a nurse at all times for urgent questions. Please call the main number to the clinic Dept: [phone] and follow the prompts.   For any non-urgent questions, you may also contact your provider using MyChart. We now offer e-Visits for anyone 18 and older to request care online for non-urgent symptoms. For details visit [email or web address].   Also download the MyChart app! Go to the app store, search "MyChart", open the app, select , and log in with your MyChart username and password.  Masks are optional in the cancer centers. If you would like for your care team to wear a mask while they are taking care of you, please let them know. You may have one support person who is at least 83 years old accompany you for your appointments. 

## 2022-02-10 ENCOUNTER — Encounter (HOSPITAL_COMMUNITY): Payer: Self-pay

## 2022-02-10 ENCOUNTER — Ambulatory Visit (HOSPITAL_COMMUNITY)
Admission: RE | Admit: 2022-02-10 | Discharge: 2022-02-10 | Disposition: A | Discharge status: Home / Self Care | Payer: Medicare HMO | Source: Ambulatory Visit | Attending: Internal Medicine | Admitting: Internal Medicine

## 2022-02-10 DIAGNOSIS — N3289 Other specified disorders of bladder: Secondary | ICD-10-CM | POA: Diagnosis not present

## 2022-02-10 DIAGNOSIS — J479 Bronchiectasis, uncomplicated: Secondary | ICD-10-CM | POA: Diagnosis not present

## 2022-02-10 DIAGNOSIS — C3490 Malignant neoplasm of unspecified part of unspecified bronchus or lung: Secondary | ICD-10-CM | POA: Insufficient documentation

## 2022-02-10 DIAGNOSIS — J90 Pleural effusion, not elsewhere classified: Secondary | ICD-10-CM | POA: Diagnosis not present

## 2022-02-10 DIAGNOSIS — N281 Cyst of kidney, acquired: Secondary | ICD-10-CM | POA: Diagnosis not present

## 2022-02-10 DIAGNOSIS — K5730 Diverticulosis of large intestine without perforation or abscess without bleeding: Secondary | ICD-10-CM | POA: Diagnosis not present

## 2022-02-10 DIAGNOSIS — J9811 Atelectasis: Secondary | ICD-10-CM | POA: Diagnosis not present

## 2022-02-10 MED ORDER — IOHEXOL 300 MG/ML  SOLN
100.0000 mL | Freq: Once | INTRAMUSCULAR | Status: AC | PRN
  Administered 2022-02-10: 76 mL via INTRAVENOUS

## 2022-02-10 MED ORDER — SODIUM CHLORIDE (PF) 0.9 % IJ SOLN
INTRAMUSCULAR | Status: AC
  Filled 2022-02-10: qty 50

## 2022-02-11 ENCOUNTER — Telehealth: Payer: Self-pay | Admitting: *Deleted

## 2022-02-11 NOTE — Progress Notes (Unsigned)
  Care Coordination Note  02/11/2022 Name: Shaun Kennedy MRN: [id number] DOB: 1940-07-14  Shaun Kennedy is a 82 y.o. year old male who is a primary care patient of [name], MD and is actively engaged with the care management team. I reached out to [name] by phone today to assist with re-scheduling a follow up visit with the RN Case Manager  Follow up plan: Unsuccessful telephone outreach attempt made. A HIPAA compliant phone message was left for the patient providing contact information and requesting a return call.  [facility]  Direct Dial: [phone]

## 2022-02-14 ENCOUNTER — Telehealth: Payer: Self-pay | Admitting: Internal Medicine

## 2022-02-14 NOTE — Telephone Encounter (Signed)
Called patient regarding upcoming January appointments, patient is notified. 

## 2022-02-14 NOTE — Progress Notes (Signed)
  Care Coordination Note  02/14/2022 Name: Shaun Kennedy MRN: [id number] DOB: Mar 23, 1940  Shaun Kennedy is a 82 y.o. year old male who is a primary care patient of [name], MD and is actively engaged with the care management team. I reached out to [name] by phone today to assist with re-scheduling a follow up visit with the RN Case Manager  Follow up plan: Telephone appointment with care management team member scheduled for:02/24/22  [place]  Direct Dial: [phone]

## 2022-02-16 ENCOUNTER — Inpatient Hospital Stay: Payer: Medicare HMO | Attending: Internal Medicine

## 2022-02-16 ENCOUNTER — Inpatient Hospital Stay: Payer: Medicare HMO

## 2022-02-16 ENCOUNTER — Other Ambulatory Visit: Payer: Self-pay

## 2022-02-16 ENCOUNTER — Inpatient Hospital Stay: Payer: Medicare HMO | Admitting: Internal Medicine

## 2022-02-16 VITALS — BP 139/66 | HR 65 | Temp 98.4°F | Resp 16 | Wt 191.1 lb

## 2022-02-16 DIAGNOSIS — Z79899 Other long term (current) drug therapy: Secondary | ICD-10-CM | POA: Diagnosis not present

## 2022-02-16 DIAGNOSIS — E119 Type 2 diabetes mellitus without complications: Secondary | ICD-10-CM | POA: Diagnosis not present

## 2022-02-16 DIAGNOSIS — C3432 Malignant neoplasm of lower lobe, left bronchus or lung: Secondary | ICD-10-CM | POA: Diagnosis not present

## 2022-02-16 DIAGNOSIS — Z7984 Long term (current) use of oral hypoglycemic drugs: Secondary | ICD-10-CM | POA: Insufficient documentation

## 2022-02-16 DIAGNOSIS — Z7982 Long term (current) use of aspirin: Secondary | ICD-10-CM | POA: Diagnosis not present

## 2022-02-16 DIAGNOSIS — I10 Essential (primary) hypertension: Secondary | ICD-10-CM | POA: Insufficient documentation

## 2022-02-16 DIAGNOSIS — E785 Hyperlipidemia, unspecified: Secondary | ICD-10-CM | POA: Diagnosis not present

## 2022-02-16 DIAGNOSIS — Z5112 Encounter for antineoplastic immunotherapy: Secondary | ICD-10-CM | POA: Insufficient documentation

## 2022-02-16 LAB — CBC WITH DIFFERENTIAL (CANCER CENTER ONLY)
Abs Immature Granulocytes: 0.01 10*3/uL (ref 0.00–0.07)
Basophils Absolute: 0 10*3/uL (ref 0.0–0.1)
Basophils Relative: 0 %
Eosinophils Absolute: 0.7 10*3/uL — ABNORMAL HIGH (ref 0.0–0.5)
Eosinophils Relative: 14 %
HCT: 32.4 % — ABNORMAL LOW (ref 39.0–52.0)
Hemoglobin: 11.2 g/dL — ABNORMAL LOW (ref 13.0–17.0)
Immature Granulocytes: 0 %
Lymphocytes Relative: 15 %
Lymphs Abs: 0.8 10*3/uL (ref 0.7–4.0)
MCH: 32.5 pg (ref 26.0–34.0)
MCHC: 34.6 g/dL (ref 30.0–36.0)
MCV: 93.9 fL (ref 80.0–100.0)
Monocytes Absolute: 0.6 10*3/uL (ref 0.1–1.0)
Monocytes Relative: 11 %
Neutro Abs: 3.3 10*3/uL (ref 1.7–7.7)
Neutrophils Relative %: 60 %
Platelet Count: 252 10*3/uL (ref 150–400)
RBC: 3.45 MIL/uL — ABNORMAL LOW (ref 4.22–5.81)
RDW: 13.1 % (ref 11.5–15.5)
WBC Count: 5.5 10*3/uL (ref 4.0–10.5)
nRBC: 0 % (ref 0.0–0.2)

## 2022-02-16 LAB — CMP (CANCER CENTER ONLY)
ALT: 12 U/L (ref 0–44)
AST: 15 U/L (ref 15–41)
Albumin: 3.7 g/dL (ref 3.5–5.0)
Alkaline Phosphatase: 52 U/L (ref 38–126)
Anion gap: 3 — ABNORMAL LOW (ref 5–15)
BUN: 18 mg/dL (ref 8–23)
CO2: 30 mmol/L (ref 22–32)
Calcium: 9.3 mg/dL (ref 8.9–10.3)
Chloride: 106 mmol/L (ref 98–111)
Creatinine: 1.18 mg/dL (ref 0.61–1.24)
GFR, Estimated: >60 mL/min (ref >60)
Glucose, Bld: 97 mg/dL (ref 70–99)
Potassium: 3.8 mmol/L (ref 3.5–5.1)
Sodium: 139 mmol/L (ref 135–145)
Total Bilirubin: 0.7 mg/dL (ref 0.3–1.2)
Total Protein: 8 g/dL (ref 6.5–8.1)

## 2022-02-16 LAB — TSH: TSH: 2.762 u[IU]/mL (ref 0.350–4.500)

## 2022-02-16 MED ORDER — DIPHENHYDRAMINE HCL 50 MG/ML IJ SOLN
25.0000 mg | Freq: Once | INTRAMUSCULAR | Status: AC
  Administered 2022-02-16: 25 mg via INTRAVENOUS
  Filled 2022-02-16: qty 1

## 2022-02-16 MED ORDER — SODIUM CHLORIDE 0.9 % IV SOLN: Freq: Once | INTRAVENOUS | Status: AC

## 2022-02-16 MED ORDER — SODIUM CHLORIDE 0.9 % IV SOLN
360.0000 mg | Freq: Once | INTRAVENOUS | Status: AC
  Administered 2022-02-16: 360 mg via INTRAVENOUS
  Filled 2022-02-16: qty 12

## 2022-02-16 MED ORDER — FAMOTIDINE IN NACL 20-0.9 MG/50ML-% IV SOLN
20.0000 mg | Freq: Once | INTRAVENOUS | Status: AC
  Administered 2022-02-16: 20 mg via INTRAVENOUS
  Filled 2022-02-16: qty 50

## 2022-02-16 MED ORDER — SODIUM CHLORIDE 0.9 % IV SOLN
1.0000 mg/kg | Freq: Once | INTRAVENOUS | Status: AC
  Administered 2022-02-16: 85 mg via INTRAVENOUS
  Filled 2022-02-16: qty 17

## 2022-02-16 NOTE — Progress Notes (Signed)
[place] [phone]   [phone]  OFFICE PROGRESS NOTE  [name], [place] [address]  DIAGNOSIS: stage IV (T3, N2, M1 a) non-small cell lung cancer, squamous cell carcinoma presented with obstructive left lower lobe lung mass in addition to mediastinal lymphadenopathy as well as bilateral pulmonary nodules diagnosed in July 2021.   PDL1: 0%   PRIOR THERAPY: None   CURRENT THERAPY: 1) Palliative radiotherapy to the obstructive left lower lobe lung mass under the care of Dr. Lisbeth Renshaw.  2)  systemic chemotherapy 2 cycles of chemotherapy with carboplatin for an AUC of 5 and paclitaxel 175 mg/m2 in addition to immunotherapy with nivolumab 360 mg every 3 weeks and ipilimumab 1 mg/kg IV every 6 weeks followed by maintenance nivolumab and ipilimumab.  He started the first treatment on 09/04/2019.  He is status post 21 cycles.    INTERVAL HISTORY: NAGI FURIO 82 y.o. male returns to the clinic today for follow-up visit.  The patient is feeling fine today with no concerning complaints.  He has occasional shortness of breath with exertion but no significant chest pain, cough or hemoptysis.  He has no nausea, vomiting, diarrhea or constipation.  He has no headache or visual changes.  He has been tolerating his treatment with ipilimumab and nivolumab fairly well.  He had repeat CT scan of the chest, abdomen and pelvis performed recently and he is here for evaluation and discussion of his scan results before starting cycle #22.   MEDICAL HISTORY: Past Medical History:  Diagnosis Date   Allergic rhinitis    Asthma    Carotid stenosis    Colon cancer ([place]) 2003   Diabetes mellitus ([place])    Diverticulosis    Dyslipidemia    ED (erectile dysfunction)    GERD (gastroesophageal reflux disease)    H/O degenerative disc disease    Hemorrhoids    HTN (hypertension)    as a child   Hyperlipidemia    Lung cancer ([place])     LVH (left ventricular hypertrophy)    on EKG   Mass of lower lobe of left lung    Mediastinal adenopathy    Smoker    former   Wears dentures    Wears dentures    Wears glasses    Wears glasses     ALLERGIES:  has No Known Allergies.  MEDICATIONS:  Current Outpatient Medications  Medication Sig Dispense Refill   Alcohol Swabs (DROPSAFE ALCOHOL PREP) 70 % PADS Apply topically.     amLODipine (NORVASC) 5 MG tablet TAKE 1 TABLET EVERY DAY 90 tablet 3   aspirin EC 81 MG tablet Take 81 mg by mouth daily after breakfast.      betamethasone valerate ointment (VALISONE) 0.1 % APPLY 1 APPLICATION TOPICALLY 2 TIMES DAILY 45 g 0   bisacodyl (DULCOLAX) 5 MG EC tablet Take 10 mg by mouth daily as needed for moderate constipation.     Blood Glucose Calibration (TRUE METRIX LEVEL 1) Low SOLN      Blood Glucose Monitoring Suppl (TRUE METRIX AIR GLUCOSE METER) w/Device KIT USE AS DIRECTED 1 kit 0   Borage, Borago officinalis, (BORAGE OIL PO) Take 2,300 mg by mouth.     cholecalciferol (VITAMIN D) 25 MCG (1000 UNIT) tablet Take 1,000 Units by mouth daily.     donepezil (ARICEPT) 5 MG tablet Take 1 tablet (5 mg total) by mouth at bedtime. 90 tablet 0  dronabinol (MARINOL) 2.5 MG capsule Take 1 capsule (2.5 mg total) by mouth 2 (two) times daily before a meal. 60 capsule 1   ferrous sulfate 325 (65 FE) MG EC tablet TAKE 1 TABLET (325 MG TOTAL) DAILY WITH BREAKFAST. 90 tablet 1   gabapentin (NEURONTIN) 100 MG capsule Take by mouth. (Patient not taking: Reported on 01/18/2022)     hydrOXYzine (ATARAX) 10 MG tablet Take 1 tablet (10 mg total) by mouth 3 (three) times daily as needed. 90 tablet 1   Lancet Devices (TRUEDRAW LANCING DEVICE) MISC Check sugars daily 90 each 0   linaclotide (LINZESS) 72 MCG capsule Take by mouth.     lisinopril-hydrochlorothiazide (ZESTORETIC) 20-12.5 MG tablet TAKE 1 TABLET EVERY DAY 90 tablet 1   metFORMIN (GLUCOPHAGE-XR) 500 MG 24 hr tablet TAKE 1 TABLET EVERY DAY 90 tablet  0   Multiple Vitamin (MULTIVITAMIN WITH MINERALS) TABS tablet Take 1 tablet by mouth daily after breakfast.      simvastatin (ZOCOR) 40 MG tablet TAKE 1 TABLET EVERY DAY 90 tablet 1   solifenacin (VESICARE) 5 MG tablet TAKE 1 TABLET EVERY DAY 90 tablet 1   TRUE METRIX BLOOD GLUCOSE TEST test strip TEST BLOOD GLUCOSE  UP TO TWO TIMES DAILY 200 strip 2   TRUEplus Lancets 33G MISC 1 each by Does not apply route 2 (two) times daily. 200 each 4   No current facility-administered medications for this visit.    SURGICAL HISTORY:  Past Surgical History:  Procedure Laterality Date   BRONCHIAL BIOPSY  08/20/2019   Procedure: BRONCHIAL BIOPSIES;  Surgeon: Collene Gobble, MD;  Location: [facility] ENDOSCOPY;  Service: Pulmonary;;   BRONCHIAL BRUSHINGS  08/20/2019   Procedure: BRONCHIAL BRUSHINGS;  Surgeon: Collene Gobble, MD;  Location: [facility] ENDOSCOPY;  Service: Pulmonary;;   BRONCHIAL NEEDLE ASPIRATION BIOPSY  08/20/2019   Procedure: BRONCHIAL NEEDLE ASPIRATION BIOPSIES;  Surgeon: Collene Gobble, MD;  Location: [facility] ENDOSCOPY;  Service: Pulmonary;;   CARPAL TUNNEL RELEASE Right 01/09/2019   Procedure: CARPAL TUNNEL RELEASE;  Surgeon: Leandrew Koyanagi, MD;  Location: [place];  Service: Orthopedics;  Laterality: Right;   CARPAL TUNNEL WITH CUBITAL TUNNEL Right 11/18/2020   Procedure: CARPAL TUNNEL WITH CUBITAL TUNNEL;  Surgeon: Charlotte Crumb, MD;  Location: [place];  Service: Orthopedics;  Laterality: Right;   CATARACT EXTRACTION Right 2018   COLONOSCOPY  2007   Dr. Benson [place]   DIRECT LARYNGOSCOPY N/A 04/10/2020   Procedure: DIRECT LARYNGOSCOPY;  Surgeon: Melida Quitter, MD;  Location: [place];  Service: ENT;  Laterality: N/A;   I & D EXTREMITY Right 04/02/2016   Procedure: IRRIGATION AND DEBRIDEMENT GREAT TOE;  Surgeon: Leandrew Koyanagi, MD;  Location: [place];  Service: Orthopedics;  Laterality: Right;   IR IMAGING GUIDED PORT INSERTION  08/30/2019   MULTIPLE TOOTH EXTRACTIONS     RADIOACTIVE SEED IMPLANT   2003   TRACHEOSTOMY TUBE PLACEMENT N/A 04/10/2020   Procedure: TRACHEOSTOMY;  Surgeon: Melida Quitter, MD;  Location: [place];  Service: ENT;  Laterality: N/A;   ULNAR TUNNEL RELEASE Right 01/09/2019   Procedure: RIGHT CUBITAL TUNNEL RELEASE AND CARPAL TUNNEL RELEASE;  Surgeon: Leandrew Koyanagi, MD;  Location: [place];  Service: Orthopedics;  Laterality: Right;   UPPER GASTROINTESTINAL ENDOSCOPY     VIDEO BRONCHOSCOPY N/A 12/18/2019   Procedure: VIDEO BRONCHOSCOPY WITHOUT FLUORO;  Surgeon: Collene Gobble, MD;  Location: [facility] ENDOSCOPY;  Service: Cardiopulmonary;  Laterality: N/A;   VIDEO BRONCHOSCOPY WITH ENDOBRONCHIAL ULTRASOUND  N/A 08/20/2019   Procedure: VIDEO BRONCHOSCOPY WITH ENDOBRONCHIAL ULTRASOUND;  Surgeon: Collene Gobble, MD;  Location: [facility] ENDOSCOPY;  Service: Pulmonary;  Laterality: N/A;    REVIEW OF SYSTEMS:  Constitutional: negative Eyes: negative Ears, nose, mouth, throat, and face: negative Respiratory: positive for dyspnea on exertion Cardiovascular: negative Gastrointestinal: negative Genitourinary:negative Integument/breast: negative Hematologic/lymphatic: negative Musculoskeletal:negative Neurological: negative Behavioral/Psych: negative Endocrine: negative Allergic/Immunologic: negative   PHYSICAL EXAMINATION: General appearance: alert, cooperative, and no distress Head: Normocephalic, without obvious abnormality, atraumatic Neck: no adenopathy, no JVD, supple, symmetrical, trachea midline, and thyroid not enlarged, symmetric, no tenderness/mass/nodules Lymph nodes: Cervical, supraclavicular, and axillary nodes normal. Resp: clear to auscultation bilaterally Back: symmetric, no curvature. ROM normal. No CVA tenderness. Cardio: regular rate and rhythm, S1, S2 normal, no murmur, click, rub or gallop GI: soft, non-tender; bowel sounds normal; no masses,  no organomegaly Extremities: extremities normal, atraumatic, no cyanosis or edema Neurologic: Alert and  oriented X 3, normal strength and tone. Normal symmetric reflexes. Normal coordination and gait  ECOG PERFORMANCE STATUS: 1 - Symptomatic but completely ambulatory  Blood pressure 139/66, pulse 65, temperature 98.4 F (36.9 C), temperature source Oral, resp. rate 16, weight 191 lb 1 oz (86.7 kg), SpO2 97 %.  LABORATORY DATA: Lab Results  Component Value Date   WBC 5.5 02/16/2022   HGB 11.2 (L) 02/16/2022   HCT 32.4 (L) 02/16/2022   MCV 93.9 02/16/2022   PLT 252 02/16/2022      Chemistry      Component Value Date/Time   NA 138 01/26/2022 1343   NA 146 (H) 10/04/2021 1616   K 3.8 01/26/2022 1343   CL 106 01/26/2022 1343   CO2 27 01/26/2022 1343   BUN 20 01/26/2022 1343   BUN 12 10/04/2021 1616   CREATININE 1.46 (H) 01/26/2022 1343   CREATININE 1.25 (H) 08/08/2016 0912      Component Value Date/Time   CALCIUM 8.8 (L) 01/26/2022 1343   ALKPHOS 50 01/26/2022 1343   AST 19 01/26/2022 1343   ALT 14 01/26/2022 1343   BILITOT 0.9 01/26/2022 1343       RADIOGRAPHIC STUDIES: CT Chest W Contrast  Result Date: 02/11/2022 CLINICAL DATA:  Non-small-cell lung cancer staging. Current therapy includes radiotherapy and systemic chemotherapy. * Tracking Code: BO * EXAM: CT CHEST, ABDOMEN, AND PELVIS WITH CONTRAST TECHNIQUE: Multidetector CT imaging of the chest, abdomen and pelvis was performed following the standard protocol during bolus administration of intravenous contrast. RADIATION DOSE REDUCTION: This exam was performed according to the departmental dose-optimization program which includes automated exposure control, adjustment of the mA and/or kV according to patient size and/or use of iterative reconstruction technique. CONTRAST:  62mL OMNIPAQUE IOHEXOL 300 MG/ML  SOLN COMPARISON:  None Available. 11/18/2021 and older FINDINGS: CT CHEST FINDINGS Cardiovascular: Right upper chest port identified. Scattered vascular calcifications are seen along the normal caliber thoracic aorta as well  as branch vessels including the coronary arteries. Trace pericardial fluid. The heart is nonenlarged. Mediastinum/Nodes: Slightly heterogeneous thyroid gland. No specific abnormal lymph node enlargement seen in the axillary regions, hilum. Only a few small less than 1 cm in size in short axis left-sided hilar nodes are seen, not pathologic by size criteria and similar to previous. Areas of small mediastinal nodes identified. Example paratracheal on axial image 23 of series 2 measuring 14 by 10 mm. In retrospect on the prior this would have measured 15 by 12 mm measured in same fashion. Other small nodes are identified and similar. Slightly patulous  esophagus. Lungs/Pleura: Tracheostomy tube in place. There is wall thickening scattered along the trachea, similar to previous. Please correlate with any symptomatology. Scattered emphysematous lung changes are identified particularly in the upper lung zones. There is some breathing motion. The right lung is without consolidation or pneumothorax. There is some dependent atelectasis. 4 mm nodular area identified along the middle lobe on image 34 of series 2, unchanged from previous. Additional focus in the right upper lobe on image 29 is also stable measuring 3 mm. There is a left lower lobe nodule identified which is subpleural on image 38 of series 2 measuring 7 mm, unchanged when adjusting for/misregistration. Few other tiny nodules elsewhere in the left lung are stable. There is perihilar consolidative areas of opacity in the left upper lobe bronchiectasis as well some areas of patchy opacity in the left lower lobe which are similar. Adjacent tiny pleural effusion is also stable. Musculoskeletal: Scattered degenerative changes of the spine. CT ABDOMEN PELVIS FINDINGS Hepatobiliary: Mild fatty liver infiltration. Gallbladder is nondilated. No enhancing liver lesion. Patent portal vein. Pancreas: Pancreatic divisum noted with slight ectasia of the pancreatic duct,  unchanged from previous. Mild global pancreatic atrophy. No enhancing pancreatic mass. Spleen: Spleen is nonenlarged. Adrenals/Urinary Tract: Nonspecific mild thickening of the adrenal glands. The focal small nodule along the lateral limb of the left adrenal gland is less well-defined today. Mild bilateral renal atrophy. Stable benign-appearing 18 mm upper pole right-sided renal cyst. No specific imaging follow-up recommended. No collecting system dilatation. Exception is slight ectasia of the distal ureters, left-greater-than-right, nonspecific. There is slight asymmetric thickening of the urinary bladder particularly towards the posterior aspect and left. Please correlate for any known history. This is unchanged from prior examination. Stomach/Bowel: On this non oral contrast exam large bowel is of normal course and caliber with scattered stool. Left-sided colonic diverticula. Diffuse colonic stool, moderate. The stomach is nondilated, relatively collapsed. The small bowel is nondilated. The previous areas of nodular wall thickening along loops of small bowel in the right hemipelvis are not appreciated today but today's study is without oral contrast. Please correlate with clinical history. Please correlate with any prior workup. There is some distal ileal small bowel stool appearance, nonspecific. Vascular/Lymphatic: Scattered vascular calcifications. Normal caliber aorta and IVC. Calcifications include more prominently along the iliac vessels diffusely. No developing abnormal lymph node enlargement present in the abdomen pelvis. Reproductive: Brachytherapy changes in the area of the prostate. Other: No significant free fluid.  No free air. Musculoskeletal: Scattered degenerative changes of the spine and pelvis. Multilevel disc bulging and trace listhesis along the lumbar spine with some areas of stenosis. IMPRESSION: Stable appearance of left lung opacities with a trace effusion and small lung nodules.  Tracheostomy tube with wall thickening along the trachea diffusely. Please correlate with any symptoms. No bowel obstruction or free air. Colonic diverticula. The previous nodular wall thickening along the ileum on the prior examination is not well seen today today's study is without oral contrast. Please correlate with prior workup and history. If needed a follow-up oral contrast study can be performed as clinically directed. Brachytherapy changes of the prostate. Persistent heterogeneous asymmetric wall thickening of the urinary bladder with slight ectasia of the distal ureters, left-greater-than-right. Electronically Signed   By: Jill Side M.D.   On: 02/11/2022 14:24   CT Abdomen Pelvis W Contrast  Result Date: 02/11/2022 CLINICAL DATA:  Non-small-cell lung cancer staging. Current therapy includes radiotherapy and systemic chemotherapy. * Tracking Code: BO * EXAM: CT CHEST,  ABDOMEN, AND PELVIS WITH CONTRAST TECHNIQUE: Multidetector CT imaging of the chest, abdomen and pelvis was performed following the standard protocol during bolus administration of intravenous contrast. RADIATION DOSE REDUCTION: This exam was performed according to the departmental dose-optimization program which includes automated exposure control, adjustment of the mA and/or kV according to patient size and/or use of iterative reconstruction technique. CONTRAST:  20mL OMNIPAQUE IOHEXOL 300 MG/ML  SOLN COMPARISON:  None Available. 11/18/2021 and older FINDINGS: CT CHEST FINDINGS Cardiovascular: Right upper chest port identified. Scattered vascular calcifications are seen along the normal caliber thoracic aorta as well as branch vessels including the coronary arteries. Trace pericardial fluid. The heart is nonenlarged. Mediastinum/Nodes: Slightly heterogeneous thyroid gland. No specific abnormal lymph node enlargement seen in the axillary regions, hilum. Only a few small less than 1 cm in size in short axis left-sided hilar nodes are  seen, not pathologic by size criteria and similar to previous. Areas of small mediastinal nodes identified. Example paratracheal on axial image 23 of series 2 measuring 14 by 10 mm. In retrospect on the prior this would have measured 15 by 12 mm measured in same fashion. Other small nodes are identified and similar. Slightly patulous esophagus. Lungs/Pleura: Tracheostomy tube in place. There is wall thickening scattered along the trachea, similar to previous. Please correlate with any symptomatology. Scattered emphysematous lung changes are identified particularly in the upper lung zones. There is some breathing motion. The right lung is without consolidation or pneumothorax. There is some dependent atelectasis. 4 mm nodular area identified along the middle lobe on image 34 of series 2, unchanged from previous. Additional focus in the right upper lobe on image 29 is also stable measuring 3 mm. There is a left lower lobe nodule identified which is subpleural on image 38 of series 2 measuring 7 mm, unchanged when adjusting for/misregistration. Few other tiny nodules elsewhere in the left lung are stable. There is perihilar consolidative areas of opacity in the left upper lobe bronchiectasis as well some areas of patchy opacity in the left lower lobe which are similar. Adjacent tiny pleural effusion is also stable. Musculoskeletal: Scattered degenerative changes of the spine. CT ABDOMEN PELVIS FINDINGS Hepatobiliary: Mild fatty liver infiltration. Gallbladder is nondilated. No enhancing liver lesion. Patent portal vein. Pancreas: Pancreatic divisum noted with slight ectasia of the pancreatic duct, unchanged from previous. Mild global pancreatic atrophy. No enhancing pancreatic mass. Spleen: Spleen is nonenlarged. Adrenals/Urinary Tract: Nonspecific mild thickening of the adrenal glands. The focal small nodule along the lateral limb of the left adrenal gland is less well-defined today. Mild bilateral renal atrophy.  Stable benign-appearing 18 mm upper pole right-sided renal cyst. No specific imaging follow-up recommended. No collecting system dilatation. Exception is slight ectasia of the distal ureters, left-greater-than-right, nonspecific. There is slight asymmetric thickening of the urinary bladder particularly towards the posterior aspect and left. Please correlate for any known history. This is unchanged from prior examination. Stomach/Bowel: On this non oral contrast exam large bowel is of normal course and caliber with scattered stool. Left-sided colonic diverticula. Diffuse colonic stool, moderate. The stomach is nondilated, relatively collapsed. The small bowel is nondilated. The previous areas of nodular wall thickening along loops of small bowel in the right hemipelvis are not appreciated today but today's study is without oral contrast. Please correlate with clinical history. Please correlate with any prior workup. There is some distal ileal small bowel stool appearance, nonspecific. Vascular/Lymphatic: Scattered vascular calcifications. Normal caliber aorta and IVC. Calcifications include more prominently along the iliac  vessels diffusely. No developing abnormal lymph node enlargement present in the abdomen pelvis. Reproductive: Brachytherapy changes in the area of the prostate. Other: No significant free fluid.  No free air. Musculoskeletal: Scattered degenerative changes of the spine and pelvis. Multilevel disc bulging and trace listhesis along the lumbar spine with some areas of stenosis. IMPRESSION: Stable appearance of left lung opacities with a trace effusion and small lung nodules. Tracheostomy tube with wall thickening along the trachea diffusely. Please correlate with any symptoms. No bowel obstruction or free air. Colonic diverticula. The previous nodular wall thickening along the ileum on the prior examination is not well seen today today's study is without oral contrast. Please correlate with prior  workup and history. If needed a follow-up oral contrast study can be performed as clinically directed. Brachytherapy changes of the prostate. Persistent heterogeneous asymmetric wall thickening of the urinary bladder with slight ectasia of the distal ureters, left-greater-than-right. Electronically Signed   By: Jill Side M.D.   On: 02/11/2022 14:24    ASSESSMENT AND PLAN: This is a very pleasant 82 years old [name] with stage IV non-small cell lung cancer, squamous cell carcinoma with negative PD-L1 expression. The patient is currently undergoing systemic chemotherapy with carboplatin and paclitaxel for 2 cycles in addition to immunotherapy with ipilimumab 1 mg/KG every 6 weeks and nivolumab 360 mg IV every 3 weeks status post 21 cycles. The patient has been tolerating this treatment fairly well with no significant adverse effects. He had repeat CT scan of the chest, abdomen and pelvis performed recently.  I personally and independently reviewed the scan and discussed the results with the patient today. His scan showed no concerning findings for disease progression. I recommended for him to continue his current treatment with maintenance ipilimumab and nivolumab and he will proceed with day 1 of cycle #22 today. I will see him back for follow-up visit in 3 weeks for evaluation before the next dose of his treatment. The patient was advised to call immediately if he has any other concerning symptoms in the interval. The patient voices understanding of current disease status and treatment options and is in agreement with the current care plan.  All questions were answered. The patient knows to call the clinic with any problems, questions or concerns. We can certainly see the patient much sooner if necessary.  Disclaimer: This note was dictated with voice recognition software. Similar sounding words can inadvertently be transcribed and may not be corrected upon review.

## 2022-02-16 NOTE — Progress Notes (Signed)
Ipilimumab ([facility]) Patient Monitoring Assessment   Is the patient experiencing any of the following general symptoms?:  [ ] Difficulty performing normal activities [ ] Feeling sluggish or cold all the time [ ] Unusual weight gain [ ] Constant or unusual headaches [ ] Feeling dizzy or faint [ ] Changes in eyesight (blurry vision, double vision, or other vision problems) [ ] Changes in mood or behavior (ex: decreased sex drive, irritability, or forgetfulness) [ ] Starting new medications (ex: steroids, other medications that lower immune response) [X ]Patient is not experiencing any of the general symptoms above.   Gastrointestinal  Patient is having 1 bowel movements each day.  Is this different from baseline? [ ] Yes [ X]No Are your stools watery or do they have a foul smell? [ ] Yes [X ]No Have you seen blood in your stools? [ ] Yes [ X]No Are your stools dark, tarry, or sticky? [ ] Yes [ X]No Are you having pain or tenderness in your belly? [ ] Yes [X ]No  Skin Does your skin itch? [ X]Yes [ ] No Do you have a rash? [ ] Yes [ X]No Has your skin blistered and/or peeled? [ ] Yes [ X]No Do you have sores in your mouth? [ ] Yes [ X]No  Hepatic Has your urine been dark or tea colored? [ ] Yes [ X]No Have you noticed that your skin or the whites of your eyes are turning yellow? [ ] Yes [X ]No Are you bleeding or bruising more easily than normal? [ ] Yes [ X]No Are you nauseous and/or vomiting? [ ] Yes [X ]No Do you have pain on the right side of your stomach? [ ] Yes [ X]No  Neurologic  Are you having unusual weakness of legs, arms, or face? [ ] Yes [ X]No Are you having numbness or tingling in your hands or feet? [ ] Yes [X ]No  Clyda Hurdle

## 2022-02-16 NOTE — Patient Instructions (Addendum)
[facility]  Discharge Instructions: Thank you for choosing [facility] to provide your oncology and hematology care.   If you have a lab appointment with the [facility], please go directly to the [facility] and check in at the registration area.   Wear comfortable clothing and clothing appropriate for easy access to any Portacath or PICC line.   We strive to give you quality time with your provider. You may need to reschedule your appointment if you arrive late (15 or more minutes).  Arriving late affects you and other patients whose appointments are after yours.  Also, if you miss three or more appointments without notifying the office, you may be dismissed from the clinic at the provider's discretion.      For prescription refill requests, have your pharmacy contact our office and allow 72 hours for refills to be completed.    Today you received the following chemotherapy and/or immunotherapy agents: nivolumab and ipilimumab      To help prevent nausea and vomiting after your treatment, we encourage you to take your nausea medication as directed.  BELOW ARE SYMPTOMS THAT SHOULD BE REPORTED IMMEDIATELY: *FEVER GREATER THAN 100.4 F (38 C) OR HIGHER *CHILLS OR SWEATING *NAUSEA AND VOMITING THAT IS NOT CONTROLLED WITH YOUR NAUSEA MEDICATION *UNUSUAL SHORTNESS OF BREATH *UNUSUAL BRUISING OR BLEEDING *URINARY PROBLEMS (pain or burning when urinating, or frequent urination) *BOWEL PROBLEMS (unusual diarrhea, constipation, pain near the anus) TENDERNESS IN MOUTH AND THROAT WITH OR WITHOUT PRESENCE OF ULCERS (sore throat, sores in mouth, or a toothache) UNUSUAL RASH, SWELLING OR PAIN  UNUSUAL VAGINAL DISCHARGE OR ITCHING   Items with * indicate a potential emergency and should be followed up as soon as possible or go to the Emergency Department if any problems should occur.  Please show the CHEMOTHERAPY ALERT CARD or IMMUNOTHERAPY ALERT CARD  at check-in to the Emergency Department and triage nurse.  Should you have questions after your visit or need to cancel or reschedule your appointment, please contact [facility]  Dept: [phone]  and follow the prompts.  Office hours are 8:00 a.m. to 4:30 p.m. Monday - Friday. Please note that voicemails left after 4:00 p.m. may not be returned until the following business day.  We are closed weekends and major holidays. You have access to a nurse at all times for urgent questions. Please call the main number to the clinic Dept: [phone] and follow the prompts.   For any non-urgent questions, you may also contact your provider using MyChart. We now offer e-Visits for anyone 7 and older to request care online for non-urgent symptoms. For details visit [email or web address].   Also download the MyChart app! Go to the app store, search "MyChart", open the app, select [facility], and log in with your MyChart username and password.

## 2022-02-17 LAB — T4: T4, Total: 7.4 ug/dL (ref 4.5–12.0)

## 2022-02-23 ENCOUNTER — Other Ambulatory Visit: Payer: Self-pay

## 2022-02-25 ENCOUNTER — Other Ambulatory Visit: Payer: Self-pay | Admitting: Family Medicine

## 2022-02-25 ENCOUNTER — Other Ambulatory Visit: Payer: Self-pay

## 2022-02-25 DIAGNOSIS — G3184 Mild cognitive impairment, so stated: Secondary | ICD-10-CM

## 2022-03-02 ENCOUNTER — Telehealth: Payer: Self-pay | Admitting: Internal Medicine

## 2022-03-02 NOTE — Telephone Encounter (Signed)
Inbound call from patient stating that he got a call from our office yesterday, I advised him that we had received a referral from Oncology for Abnormal CT scan, small bowel. I advised patient that the next available was with an APP on 2/12. Patient stated he did not want to wait that long and requested I send a message to the nurse to see if he could be seen sooner. Please advise.

## 2022-03-02 NOTE — Telephone Encounter (Signed)
Pt scheduled to see Tye Savoy NP 1/26@1 :30pm. Pt aware of appt.

## 2022-03-03 ENCOUNTER — Ambulatory Visit: Payer: Self-pay

## 2022-03-03 ENCOUNTER — Telehealth: Payer: Self-pay | Admitting: Family Medicine

## 2022-03-03 NOTE — Patient Outreach (Signed)
  Care Coordination   03/03/2022 Name: Shaun Kennedy MRN: [id number] DOB: 18-Apr-1940   Care Coordination Outreach Attempts:  An unsuccessful telephone outreach was attempted for a scheduled appointment today.  Follow Up Plan:  Additional outreach attempts will be made to offer the patient care coordination information and services.   Encounter Outcome:  No Answer   Care Coordination Interventions:  No, not indicated    Barb Merino, RN, BSN, CCM Care Management Coordinator [facility] Care Management  Direct Phone: [phone]

## 2022-03-03 NOTE — Telephone Encounter (Signed)
Tried calling patient to schedule Medicare Annual Wellness Visit (AWV) either virtually or in office. my jabber number [phone]  No answer  it sounded like someone picked up and then phone disconnected   Last AWV 03/31/21 please schedule with Nurse Health Adviser   45 min for awv-i  in office appointments 30 min for awv-s & awv-i phone/virtual appointments

## 2022-03-04 ENCOUNTER — Encounter: Payer: Self-pay | Admitting: Nurse Practitioner

## 2022-03-04 ENCOUNTER — Ambulatory Visit: Payer: Medicare HMO | Admitting: Nurse Practitioner

## 2022-03-04 ENCOUNTER — Telehealth: Payer: Self-pay | Admitting: Internal Medicine

## 2022-03-04 VITALS — BP 134/76 | HR 72 | Ht 67.0 in

## 2022-03-04 DIAGNOSIS — R933 Abnormal findings on diagnostic imaging of other parts of digestive tract: Secondary | ICD-10-CM | POA: Diagnosis not present

## 2022-03-04 NOTE — Patient Instructions (Signed)
_______________________________________________________  If your blood pressure at your visit was 140/90 or greater, please contact your primary care physician to follow up on this.  _______________________________________________________  If you are age 84 or older, your body mass index should be between 23-30. Your Body mass index is 29.92 kg/m. If this is out of the aforementioned range listed, please consider follow up with your Primary Care Provider.  If you are age 61 or younger, your body mass index should be between 19-25. Your Body mass index is 29.92 kg/m. If this is out of the aformentioned range listed, please consider follow up with your Primary Care Provider.   ________________________________________________________  The [facility] providers would like to encourage you to use [facility] to communicate with providers for non-urgent requests or questions.  Due to long hold times on the telephone, sending your provider a message by [facility] may be a faster and more efficient way to get a response.  Please allow 48 business hours for a response.  Please remember that this is for non-urgent requests.  _______________________________________________________  We will call you early next week.  Thank you for trusting me with your gastrointestinal care!    Tye Savoy , [place]

## 2022-03-04 NOTE — Telephone Encounter (Signed)
Patient's spouse called to confirm 1/26 appointment. Patient will be notified of all upcoming appointments.

## 2022-03-04 NOTE — Progress Notes (Signed)
Assessment    Patient profile:  Shaun Kennedy is a 82 y.o. male known to Dr. Rush Landmark with a past medical history of DM2, bilateral carotid stenosis, emphysema, stage IV non-small cell lung cancer ( squamous cell), permanent trach. See PMH /PSH for additional history.  Oncology has asked Shaun Kennedy to wait abnormal findings on CT scan  # 83 year old male with stage IV non-small cell lung cancer receiving palliative radiotherapy to unobstructive LLL lung mass.  Also receiving chemotherapy. Patient is followed by Dr. Julien Nordmann.  No evidence for disease progression on most recent CT scan 02/10/2022  # Abnormal distal small bowel on CT scan 11/20/2021 showing interval development of severe mural thickening in the distal small bowel, somewhat masslike.  Findings nonspecific but concerning for potential small bowel metastasis.  Area could not be adequately evaluated on most recent CT scan done 02/10/2022 due to lack of oral contrast.    Plan   Shaun Kennedy feels okay.  He has no GI symptoms to correlate with distal small bowel findings on imaging.  I will discuss case with his primary gastroenterologist, Dr. Rush Landmark, to figure out the next best step for evaluation of CT abnormalities.  Maybe CT enterography versus small bowel capsule vrs colonoscopy,  though the area of concern may not be within reach of the colonoscope  HPI    Chief complaint: Abnormal CT scan   Shaun Kennedy has stage IV non-small cell lung cancer recieving palliative radiotherapy to an obstructive left lower lobe lung mass.  He is also receiving chemotherapy.  Followed by Dr. Julien Nordmann.  Recent CT scan of the chest abdomen and pelvis did not show disease progression.  Shaun Kennedy has undergone several CT scans.  Recently there has been some concern about abnormalities of the distal ileum.  On 08/31/21 a CT scan with contrast showed no bowel abnormalities.    Subsequent follow-up CT scan of the chest abdomen and pelvis on 11/18/2021 showed interval  development of areas of severe mural thickening in the distal small bowel, somewhat masslike in appearance.  Small bowel metastasis cannot be excluded.  Of note given the position of the lesions it was felt to be that the area was not accessible by colonoscopy.    A repeat CT scan of the chest / abdomen / and pelvis with IV  contrast on 02/10/2022  .  The previous nodular wall thickening along the ileum on the previous CT was not well-seen given lack of oral contrast.   Shaun Kennedy says he feels well.  He has no GI complaints.  He has not had any abdominal pain, specifically no RLQ pain.  No bowel changes.  No blood in stool.  His appetite is good.  Weight stable   Previous GI Evaluation   Patient's last colonoscopy was 08/05/2019 Extent of the exam was to the cecum.  Findings included multiple small and large mouth diverticula in the left colon, external and internal hemorrhoids    Labs:     Latest Ref Rng & Units 02/16/2022    9:52 AM 01/26/2022    1:43 PM 01/05/2022    1:45 PM  CBC  WBC 4.0 - 10.5 K/uL 5.5  6.0  6.2   Hemoglobin 13.0 - 17.0 g/dL 11.2  10.7  10.8   Hematocrit 39.0 - 52.0 % 32.4  31.0  32.8   Platelets 150 - 400 K/uL 252  250  239        Latest Ref Rng & Units 02/16/2022  9:52 AM 01/26/2022    1:43 PM 01/05/2022    1:45 PM  Hepatic Function  Total Protein 6.5 - 8.1 g/dL 8.0  7.6  8.3   Albumin 3.5 - 5.0 g/dL 3.7  3.3  3.9   AST 15 - 41 U/L 15  19  16    ALT 0 - 44 U/L 12  14  11    Alk Phosphatase 38 - 126 U/L 52  50  54   Total Bilirubin 0.3 - 1.2 mg/dL 0.7  0.9  0.6      Past Medical History:  Diagnosis Date   Allergic rhinitis    Asthma    Carotid stenosis    Colon cancer ([place]) 2003   Diabetes mellitus ([place])    Diverticulosis    Dyslipidemia    ED (erectile dysfunction)    GERD (gastroesophageal reflux disease)    H/O degenerative disc disease    Hemorrhoids    HTN (hypertension)    as a child   Hyperlipidemia    Lung cancer ([facility])    LVH (left  ventricular hypertrophy)    on EKG   Mass of lower lobe of left lung    Mediastinal adenopathy    Smoker    former   Wears dentures    Wears dentures    Wears glasses    Wears glasses     Past Surgical History:  Procedure Laterality Date   BRONCHIAL BIOPSY  08/20/2019   Procedure: BRONCHIAL BIOPSIES;  Surgeon: Collene Gobble, MD;  Location[facility] ENDOSCOPY;  Service: Pulmonary;;   BRONCHIAL BRUSHINGS  08/20/2019   Procedure: BRONCHIAL BRUSHINGS;  Surgeon: Collene Gobble, MD;  [facility];  Service: Pulmonary;;   BRONCHIAL NEEDLE ASPIRATION BIOPSY  08/20/2019   Procedure: BRONCHIAL NEEDLE ASPIRATION BIOPSIES;  Surgeon: Collene Gobble, MD;  Location: [facility] ENDOSCOPY;  Service: Pulmonary;;   CARPAL TUNNEL RELEASE Right 01/09/2019   Procedure: CARPAL TUNNEL RELEASE;  Surgeon: Leandrew Koyanagi, MD;  Location: [facility];  Service: Orthopedics;  Laterality: Right;   CARPAL TUNNEL WITH CUBITAL TUNNEL Right 11/18/2020   Procedure: CARPAL TUNNEL WITH CUBITAL TUNNEL;  Surgeon: Charlotte Crumb, MD;  Location: [facility];  Service: Orthopedics;  Laterality: Right;   CATARACT EXTRACTION Right 2018   COLONOSCOPY  2007   Dr. Benson Norway   DIRECT LARYNGOSCOPY N/A 04/10/2020   Procedure: DIRECT LARYNGOSCOPY;  Surgeon: Melida Quitter, MD;  Location: [facility];  Service: ENT;  Laterality: N/A;   I & D EXTREMITY Right 04/02/2016   Procedure: IRRIGATION AND DEBRIDEMENT GREAT TOE;  Surgeon: Leandrew Koyanagi, MD;  Location: [facility];  Service: Orthopedics;  Laterality: Right;   IR IMAGING GUIDED PORT INSERTION  08/30/2019   MULTIPLE TOOTH EXTRACTIONS     RADIOACTIVE SEED IMPLANT  2003   TRACHEOSTOMY TUBE PLACEMENT N/A 04/10/2020   Procedure: TRACHEOSTOMY;  Surgeon: Melida Quitter, MD;  Location: [facility];  Service: ENT;  Laterality: N/A;   ULNAR TUNNEL RELEASE Right 01/09/2019   Procedure: RIGHT CUBITAL TUNNEL RELEASE AND CARPAL TUNNEL RELEASE;  Surgeon: Leandrew Koyanagi, MD;  Location: [facility];   Service: Orthopedics;  Laterality: Right;   UPPER GASTROINTESTINAL ENDOSCOPY     VIDEO BRONCHOSCOPY N/A 12/18/2019   Procedure: VIDEO BRONCHOSCOPY WITHOUT FLUORO;  Surgeon: Collene Gobble, MD;  Location: [facility] ENDOSCOPY;  Service: Cardiopulmonary;  Laterality: N/A;   VIDEO BRONCHOSCOPY WITH ENDOBRONCHIAL ULTRASOUND N/A 08/20/2019   Procedure: VIDEO BRONCHOSCOPY WITH ENDOBRONCHIAL ULTRASOUND;  Surgeon: Collene Gobble,  MD;  Location: [facility] ENDOSCOPY;  Service: Pulmonary;  Laterality: N/A;    Current Medications, Allergies, Family History and Social History were reviewed in [employer] record.     Current Outpatient Medications  Medication Sig Dispense Refill   Alcohol Swabs (DROPSAFE ALCOHOL PREP) 70 % PADS Apply topically.     amLODipine (NORVASC) 5 MG tablet TAKE 1 TABLET EVERY DAY 90 tablet 3   aspirin EC 81 MG tablet Take 81 mg by mouth daily after breakfast.      betamethasone valerate ointment (VALISONE) 0.1 % APPLY 1 APPLICATION TOPICALLY 2 TIMES DAILY 45 g 0   bisacodyl (DULCOLAX) 5 MG EC tablet Take 10 mg by mouth daily as needed for moderate constipation.     Blood Glucose Calibration (TRUE METRIX LEVEL 1) Low SOLN      Blood Glucose Monitoring Suppl (TRUE METRIX AIR GLUCOSE METER) w/Device KIT USE AS DIRECTED 1 kit 0   Borage, Borago officinalis, (BORAGE OIL PO) Take 2,300 mg by mouth.     cholecalciferol (VITAMIN D) 25 MCG (1000 UNIT) tablet Take 1,000 Units by mouth daily.     donepezil (ARICEPT) 5 MG tablet TAKE 1 TABLET AT BEDTIME 90 tablet 0   dronabinol (MARINOL) 2.5 MG capsule Take 1 capsule (2.5 mg total) by mouth 2 (two) times daily before a meal. 60 capsule 1   ferrous sulfate 325 (65 FE) MG EC tablet TAKE 1 TABLET (325 MG TOTAL) DAILY WITH BREAKFAST. 90 tablet 1   gabapentin (NEURONTIN) 100 MG capsule Take by mouth. (Patient not taking: Reported on 01/18/2022)     hydrOXYzine (ATARAX) 10 MG tablet Take 1 tablet (10 mg total) by mouth 3 (three) times  daily as needed. 90 tablet 1   Lancet Devices (TRUEDRAW LANCING DEVICE) MISC Check sugars daily 90 each 0   linaclotide (LINZESS) 72 MCG capsule Take by mouth.     lisinopril-hydrochlorothiazide (ZESTORETIC) 20-12.5 MG tablet TAKE 1 TABLET EVERY DAY 90 tablet 0   metFORMIN (GLUCOPHAGE-XR) 500 MG 24 hr tablet TAKE 1 TABLET EVERY DAY 90 tablet 0   Multiple Vitamin (MULTIVITAMIN WITH MINERALS) TABS tablet Take 1 tablet by mouth daily after breakfast.      simvastatin (ZOCOR) 40 MG tablet TAKE 1 TABLET EVERY DAY 90 tablet 1   solifenacin (VESICARE) 5 MG tablet TAKE 1 TABLET EVERY DAY 90 tablet 1   TRUE METRIX BLOOD GLUCOSE TEST test strip TEST BLOOD GLUCOSE  UP TO TWO TIMES DAILY 200 strip 2   TRUEplus Lancets 33G MISC 1 each by Does not apply route 2 (two) times daily. 200 each 4   No current facility-administered medications for this visit.    Review of Systems: No chest pain. No shortness of breath. No urinary complaints.    Physical Exam  Wt Readings from Last 3 Encounters:  02/16/22 191 lb 1 oz (86.7 kg)  01/26/22 191 lb 9 oz (86.9 kg)  01/18/22 188 lb 9.6 oz (85.5 kg)    BP 134/76   Pulse 72   Ht 5\' 7"  (1.702 m)   BMI 29.92 kg/m  Constitutional:  Generally well appearing male in no acute distress. Psychiatric: Pleasant. Normal mood and affect. Behavior is normal. EENT: Pupils normal.  Conjunctivae are normal. No scleral icterus. Neck supple.  Cardiovascular: Normal rate, regular rhythm.  Pulmonary/chest: Effort normal and breath sounds normal. No wheezing, rales or rhonchi. Abdominal: Soft, nondistended, nontender. Bowel sounds active throughout. There are no masses palpable. No hepatomegaly. Neurological: Alert and oriented  to person place and time. Musculoskeletal:  Skin: Skin is warm and dry. No rashes noted.  Tye Savoy, NP  03/04/2022, 12:56 PM

## 2022-03-05 ENCOUNTER — Other Ambulatory Visit: Payer: Self-pay | Admitting: Family Medicine

## 2022-03-05 NOTE — Progress Notes (Signed)
Attending Physician's Attestation   I have reviewed the chart.   I agree with the Advanced Practitioner's note, impression, and recommendations with any updates as below. Endoscopic evaluation of this area in question would likely require double-balloon enteroscopy.  I think it would make most sense to perform a CT enterography to evaluate the area little bit more closely.  If this shows abnormalities then pursuing a video capsule endoscopy understanding the potential risk for capsule retainment can be considered for further evaluation.  If he is up for it then would move forward with CT enterography.   Justice Britain, MD [facility] Advanced Endoscopy Office # [phone]

## 2022-03-06 NOTE — Progress Notes (Unsigned)
[facility] OFFICE PROGRESS NOTE  [name], [place] [address]  DIAGNOSIS: Stage IV (T3, N2, M1 a) non-small cell lung cancer, squamous cell carcinoma presented with obstructive left lower lobe lung mass in addition to mediastinal lymphadenopathy as well as bilateral pulmonary nodules diagnosed in July 2021.   PDL1: 0%  PRIOR THERAPY: Palliative radiotherapy to the obstructive left lower lobe lung mass under the care of Dr. Lisbeth Renshaw. Completed on 09/20/19    CURRENT THERAPY: Systemic chemotherapy 2 cycles of chemotherapy with carboplatin for an AUC of 5 and paclitaxel 175 mg/m2 in addition to immunotherapy with nivolumab 360 mg every 3 weeks and ipilimumab 1 mg/kg IV every 6 weeks followed by maintenance nivolumab and ipilimumab.  He started the first treatment on 09/04/2019.  He is status post 22 cycles of treatment.     INTERVAL HISTORY: Shaun Kennedy 82 y.o. male returns to the clinic today for a follow up visit accompanied by his wife and his daughter were available by phone. They were under the impression the patient had new CT scan results today. His last scan was on 1/4 and he had a follow up to review the scan at his last appointment with Dr. Julien Nordmann on 02/16/22.    The patient is feeling well today without any concerning complaints. He follows closely with ENT for his tracheostomy.    The patient continues to tolerate treatment with immunotherapy well without any adverse side effects. Denies any fever, chills, and night sweats. He reports his breathing is stable. He has dyspnea on exertion which is stable. He denies any cough unless his tracheostomy gets clogged. He denies hemoptysis. Denies any nausea, vomiting, blood in the stool, constipation, or diarrhea, or abdominal pain.  Denies any rashes or skin changes. With a prior restaging CT scan in October 2023, it noted mural thickening in the distal small bowel, somewhat mass-like in  appearance. This is nonspecific but concerning for small bowel metastasis.  Given the position of these lesions, this is unlikely to be accessible by endoscopy or colonoscopy.  He saw GI on 03/04/22 who discussed the options. The patient's family is wanting to review their recommendations today. He is here for evaluation before starting day 22 cycle #22 today.   MEDICAL HISTORY: Past Medical History:  Diagnosis Date   Allergic rhinitis    Asthma    Carotid stenosis    Colon cancer ([facility]) 2003   Diabetes mellitus ([place] [facility])    Diverticulosis    Dyslipidemia    ED (erectile dysfunction)    GERD (gastroesophageal reflux disease)    H/O degenerative disc disease    Hemorrhoids    HTN (hypertension)    as a child   Hyperlipidemia    Lung cancer ([place])    LVH (left ventricular hypertrophy)    on EKG   Mass of lower lobe of left lung    Mediastinal adenopathy    Smoker    former   Wears dentures    Wears dentures    Wears glasses    Wears glasses     ALLERGIES:  has No Known Allergies.  MEDICATIONS:  Current Outpatient Medications  Medication Sig Dispense Refill   Alcohol Swabs (DROPSAFE ALCOHOL PREP) 70 % PADS Apply topically.     amLODipine (NORVASC) 5 MG tablet TAKE 1 TABLET EVERY DAY 90 tablet 3   aspirin EC 81 MG tablet Take 81 mg by mouth daily after breakfast.  betamethasone valerate ointment (VALISONE) 0.1 % APPLY 1 APPLICATION TOPICALLY 2 TIMES DAILY 45 g 0   bisacodyl (DULCOLAX) 5 MG EC tablet Take 10 mg by mouth daily as needed for moderate constipation.     Blood Glucose Calibration (TRUE METRIX LEVEL 1) Low SOLN      Blood Glucose Monitoring Suppl (TRUE METRIX AIR GLUCOSE METER) w/Device KIT USE AS DIRECTED 1 kit 0   Borage, Borago officinalis, (BORAGE OIL PO) Take 2,300 mg by mouth.     cholecalciferol (VITAMIN D) 25 MCG (1000 UNIT) tablet Take 1,000 Units by mouth daily.     donepezil (ARICEPT) 5 MG tablet TAKE 1 TABLET AT BEDTIME 90 tablet 0   dronabinol (MARINOL)  2.5 MG capsule Take 1 capsule (2.5 mg total) by mouth 2 (two) times daily before a meal. 60 capsule 1   ferrous sulfate 325 (65 FE) MG EC tablet TAKE 1 TABLET (325 MG TOTAL) DAILY WITH BREAKFAST. 90 tablet 1   gabapentin (NEURONTIN) 100 MG capsule Take by mouth.     hydrOXYzine (ATARAX) 10 MG tablet Take 1 tablet (10 mg total) by mouth 3 (three) times daily as needed. 90 tablet 1   Lancet Devices (TRUEDRAW LANCING DEVICE) MISC Check sugars daily 90 each 0   linaclotide (LINZESS) 72 MCG capsule Take by mouth.     lisinopril-hydrochlorothiazide (ZESTORETIC) 20-12.5 MG tablet TAKE 1 TABLET EVERY DAY 90 tablet 0   metFORMIN (GLUCOPHAGE-XR) 500 MG 24 hr tablet TAKE 1 TABLET EVERY DAY 90 tablet 0   Multiple Vitamin (MULTIVITAMIN WITH MINERALS) TABS tablet Take 1 tablet by mouth daily after breakfast.      simvastatin (ZOCOR) 40 MG tablet TAKE 1 TABLET EVERY DAY 90 tablet 1   solifenacin (VESICARE) 5 MG tablet TAKE 1 TABLET EVERY DAY 90 tablet 1   TRUE METRIX BLOOD GLUCOSE TEST test strip TEST BLOOD GLUCOSE  UP TO TWO TIMES DAILY 200 strip 2   TRUEplus Lancets 33G MISC 1 each by Does not apply route 2 (two) times daily. 200 each 4   No current facility-administered medications for this visit.   Facility-Administered Medications Ordered in Other Visits  Medication Dose Route Frequency Provider Last Rate Last Admin   0.9 %  sodium chloride infusion   Intravenous Once Curt Bears, MD       heparin lock flush 100 unit/mL  500 Units Intracatheter Once PRN Curt Bears, MD       nivolumab (OPDIVO) 360 mg in sodium chloride 0.9 % 100 mL chemo infusion  360 mg Intravenous Once Curt Bears, MD       sodium chloride flush (NS) 0.9 % injection 10 mL  10 mL Intracatheter PRN Curt Bears, MD        SURGICAL HISTORY:  Past Surgical History:  Procedure Laterality Date   BRONCHIAL BIOPSY  08/20/2019   Procedure: BRONCHIAL BIOPSIES;  Surgeon: Collene Gobble, MD;  Location: [facility];   Service: Pulmonary;;   BRONCHIAL BRUSHINGS  08/20/2019   Procedure: BRONCHIAL BRUSHINGS;  Surgeon: Collene Gobble, MD;  Location: [facility];  Service: Pulmonary;;   BRONCHIAL NEEDLE ASPIRATION BIOPSY  08/20/2019   Procedure: BRONCHIAL NEEDLE ASPIRATION BIOPSIES;  Surgeon: Collene Gobble, MD;  Location: [facility];  Service: Pulmonary;;   CARPAL TUNNEL RELEASE Right 01/09/2019   Procedure: CARPAL TUNNEL RELEASE;  Surgeon: Leandrew Koyanagi, MD;  Location: [facility];  Service: Orthopedics;  Laterality: Right;   CARPAL TUNNEL WITH CUBITAL TUNNEL Right 11/18/2020   Procedure:  CARPAL TUNNEL WITH CUBITAL TUNNEL;  Surgeon: Charlotte Crumb, MD;  Location: [facility];  Service: Orthopedics;  Laterality: Right;   CATARACT EXTRACTION Right 2018   COLONOSCOPY  2007   Dr. Benson Norway   DIRECT LARYNGOSCOPY N/A 04/10/2020   Procedure: DIRECT LARYNGOSCOPY;  Surgeon: Melida Quitter, MD;  Location: [facility];  Service: ENT;  Laterality: N/A;   I & D EXTREMITY Right 04/02/2016   Procedure: IRRIGATION AND DEBRIDEMENT GREAT TOE;  Surgeon: Leandrew Koyanagi, MD;  Location: [facility];  Service: Orthopedics;  Laterality: Right;   IR IMAGING GUIDED PORT INSERTION  08/30/2019   MULTIPLE TOOTH EXTRACTIONS     RADIOACTIVE SEED IMPLANT  2003   TRACHEOSTOMY TUBE PLACEMENT N/A 04/10/2020   Procedure: TRACHEOSTOMY;  Surgeon: Melida Quitter, MD;  Location: [facility];  Service: ENT;  Laterality: N/A;   ULNAR TUNNEL RELEASE Right 01/09/2019   Procedure: RIGHT CUBITAL TUNNEL RELEASE AND CARPAL TUNNEL RELEASE;  Surgeon: Leandrew Koyanagi, MD;  Location: [facility];  Service: Orthopedics;  Laterality: Right;   UPPER GASTROINTESTINAL ENDOSCOPY     VIDEO BRONCHOSCOPY N/A 12/18/2019   Procedure: VIDEO BRONCHOSCOPY WITHOUT FLUORO;  Surgeon: Collene Gobble, MD;  Location: [facility];  Service: Cardiopulmonary;  Laterality: N/A;   VIDEO BRONCHOSCOPY WITH ENDOBRONCHIAL ULTRASOUND N/A 08/20/2019   Procedure: VIDEO BRONCHOSCOPY WITH  ENDOBRONCHIAL ULTRASOUND;  Surgeon: Collene Gobble, MD;  Location: [facility] ENDOSCOPY;  Service: Pulmonary;  Laterality: N/A;    REVIEW OF SYSTEMS:   Review of Systems  Constitutional: Negative for appetite change, chills, fatigue, fever and unexpected weight change.  HENT: Negative for mouth sores, nosebleeds, sore throat and trouble swallowing.   Eyes: Negative for eye problems and icterus.  Respiratory: Positive for occasional cough depending on mucus blocking his trach. Positive for mild dyspnea with certain activities. Negative for hemoptysis and wheezing.   Cardiovascular: Negative for chest pain and leg swelling.  Gastrointestinal: Negative for abdominal pain, constipation, diarrhea, nausea and vomiting.  Genitourinary: Negative for bladder incontinence, difficulty urinating, dysuria, frequency and hematuria.   Musculoskeletal: Negative for back pain, gait problem, neck pain and neck stiffness.  Skin: Negative for itching and rash.  Neurological: Negative for dizziness, extremity weakness, gait problem, headaches, light-headedness and seizures.  Hematological: Negative for adenopathy. Does not bruise/bleed easily.  Psychiatric/Behavioral: Negative for confusion, depression and sleep disturbance. The patient is not nervous/anxious.   PHYSICAL EXAMINATION:  Blood pressure (!) 140/70, pulse 70, temperature 98.3 F (36.8 C), temperature source Oral, resp. rate 17, weight 192 lb 11.2 oz (87.4 kg), SpO2 100 %.  ECOG PERFORMANCE STATUS: 1  Physical Exam  Constitutional: Oriented to person, place, and time and well-developed, well-nourished, and in no distress. No distress.  HENT:  Head: Normocephalic and atraumatic.  Mouth/Throat: Trach in place.  Eyes: Conjunctivae are normal. Right eye exhibits no discharge. Left eye exhibits no discharge. No scleral icterus.  Neck: Normal range of motion. Neck supple.  Cardiovascular: Normal rate, regular rhythm, normal heart sounds and intact distal  pulses.   Pulmonary/Chest: Effort normal and breath sounds normal. No respiratory distress. No wheezes. No rales. Positive for rhonchi.  Abdominal: Soft.  Exhibits no distension.There is no tenderness.  Musculoskeletal: Normal range of motion.  Lymphadenopathy:    No cervical adenopathy.  Neurological: Alert and oriented to person, place, and time. Exhibits normal muscle tone. Gait normal. Coordination normal.  Skin: Skin is warm and dry. No rash noted. Not diaphoretic. No erythema. No pallor.  Psychiatric: Mood, memory and judgment normal.  Vitals reviewed.  LABORATORY DATA: Lab Results  Component Value Date   WBC 6.7 03/09/2022   HGB 10.8 (L) 03/09/2022   HCT 31.1 (L) 03/09/2022   MCV 93.1 03/09/2022   PLT 259 03/09/2022      Chemistry      Component Value Date/Time   NA 138 03/09/2022 0941   NA 146 (H) 10/04/2021 1616   K 3.8 03/09/2022 0941   CL 106 03/09/2022 0941   CO2 28 03/09/2022 0941   BUN 19 03/09/2022 0941   BUN 12 10/04/2021 1616   CREATININE 1.26 (H) 03/09/2022 0941   CREATININE 1.25 (H) 08/08/2016 0912      Component Value Date/Time   CALCIUM 9.0 03/09/2022 0941   ALKPHOS 55 03/09/2022 0941   AST 16 03/09/2022 0941   ALT 10 03/09/2022 0941   BILITOT 0.6 03/09/2022 0941       RADIOGRAPHIC STUDIES:  CT Chest W Contrast  Result Date: 02/11/2022 CLINICAL DATA:  Non-small-cell lung cancer staging. Current therapy includes radiotherapy and systemic chemotherapy. * Tracking Code: BO * EXAM: CT CHEST, ABDOMEN, AND PELVIS WITH CONTRAST TECHNIQUE: Multidetector CT imaging of the chest, abdomen and pelvis was performed following the standard protocol during bolus administration of intravenous contrast. RADIATION DOSE REDUCTION: This exam was performed according to the departmental dose-optimization program which includes automated exposure control, adjustment of the mA and/or kV according to patient size and/or use of iterative reconstruction technique. CONTRAST:   16mL OMNIPAQUE IOHEXOL 300 MG/ML  SOLN COMPARISON:  None Available. 11/18/2021 and older FINDINGS: CT CHEST FINDINGS Cardiovascular: Right upper chest port identified. Scattered vascular calcifications are seen along the normal caliber thoracic aorta as well as branch vessels including the coronary arteries. Trace pericardial fluid. The heart is nonenlarged. Mediastinum/Nodes: Slightly heterogeneous thyroid gland. No specific abnormal lymph node enlargement seen in the axillary regions, hilum. Only a few small less than 1 cm in size in short axis left-sided hilar nodes are seen, not pathologic by size criteria and similar to previous. Areas of small mediastinal nodes identified. Example paratracheal on axial image 23 of series 2 measuring 14 by 10 mm. In retrospect on the prior this would have measured 15 by 12 mm measured in same fashion. Other small nodes are identified and similar. Slightly patulous esophagus. Lungs/Pleura: Tracheostomy tube in place. There is wall thickening scattered along the trachea, similar to previous. Please correlate with any symptomatology. Scattered emphysematous lung changes are identified particularly in the upper lung zones. There is some breathing motion. The right lung is without consolidation or pneumothorax. There is some dependent atelectasis. 4 mm nodular area identified along the middle lobe on image 34 of series 2, unchanged from previous. Additional focus in the right upper lobe on image 29 is also stable measuring 3 mm. There is a left lower lobe nodule identified which is subpleural on image 38 of series 2 measuring 7 mm, unchanged when adjusting for/misregistration. Few other tiny nodules elsewhere in the left lung are stable. There is perihilar consolidative areas of opacity in the left upper lobe bronchiectasis as well some areas of patchy opacity in the left lower lobe which are similar. Adjacent tiny pleural effusion is also stable. Musculoskeletal: Scattered  degenerative changes of the spine. CT ABDOMEN PELVIS FINDINGS Hepatobiliary: Mild fatty liver infiltration. Gallbladder is nondilated. No enhancing liver lesion. Patent portal vein. Pancreas: Pancreatic divisum noted with slight ectasia of the pancreatic duct, unchanged from previous. Mild global pancreatic atrophy. No enhancing pancreatic mass. Spleen: Spleen is nonenlarged.  Adrenals/Urinary Tract: Nonspecific mild thickening of the adrenal glands. The focal small nodule along the lateral limb of the left adrenal gland is less well-defined today. Mild bilateral renal atrophy. Stable benign-appearing 18 mm upper pole right-sided renal cyst. No specific imaging follow-up recommended. No collecting system dilatation. Exception is slight ectasia of the distal ureters, left-greater-than-right, nonspecific. There is slight asymmetric thickening of the urinary bladder particularly towards the posterior aspect and left. Please correlate for any known history. This is unchanged from prior examination. Stomach/Bowel: On this non oral contrast exam large bowel is of normal course and caliber with scattered stool. Left-sided colonic diverticula. Diffuse colonic stool, moderate. The stomach is nondilated, relatively collapsed. The small bowel is nondilated. The previous areas of nodular wall thickening along loops of small bowel in the right hemipelvis are not appreciated today but today's study is without oral contrast. Please correlate with clinical history. Please correlate with any prior workup. There is some distal ileal small bowel stool appearance, nonspecific. Vascular/Lymphatic: Scattered vascular calcifications. Normal caliber aorta and IVC. Calcifications include more prominently along the iliac vessels diffusely. No developing abnormal lymph node enlargement present in the abdomen pelvis. Reproductive: Brachytherapy changes in the area of the prostate. Other: No significant free fluid.  No free air. Musculoskeletal:  Scattered degenerative changes of the spine and pelvis. Multilevel disc bulging and trace listhesis along the lumbar spine with some areas of stenosis. IMPRESSION: Stable appearance of left lung opacities with a trace effusion and small lung nodules. Tracheostomy tube with wall thickening along the trachea diffusely. Please correlate with any symptoms. No bowel obstruction or free air. Colonic diverticula. The previous nodular wall thickening along the ileum on the prior examination is not well seen today today's study is without oral contrast. Please correlate with prior workup and history. If needed a follow-up oral contrast study can be performed as clinically directed. Brachytherapy changes of the prostate. Persistent heterogeneous asymmetric wall thickening of the urinary bladder with slight ectasia of the distal ureters, left-greater-than-right. Electronically Signed   By: Jill Side M.D.   On: 02/11/2022 14:24   CT Abdomen Pelvis W Contrast  Result Date: 02/11/2022 CLINICAL DATA:  Non-small-cell lung cancer staging. Current therapy includes radiotherapy and systemic chemotherapy. * Tracking Code: BO * EXAM: CT CHEST, ABDOMEN, AND PELVIS WITH CONTRAST TECHNIQUE: Multidetector CT imaging of the chest, abdomen and pelvis was performed following the standard protocol during bolus administration of intravenous contrast. RADIATION DOSE REDUCTION: This exam was performed according to the departmental dose-optimization program which includes automated exposure control, adjustment of the mA and/or kV according to patient size and/or use of iterative reconstruction technique. CONTRAST:  55mL OMNIPAQUE IOHEXOL 300 MG/ML  SOLN COMPARISON:  None Available. 11/18/2021 and older FINDINGS: CT CHEST FINDINGS Cardiovascular: Right upper chest port identified. Scattered vascular calcifications are seen along the normal caliber thoracic aorta as well as branch vessels including the coronary arteries. Trace pericardial  fluid. The heart is nonenlarged. Mediastinum/Nodes: Slightly heterogeneous thyroid gland. No specific abnormal lymph node enlargement seen in the axillary regions, hilum. Only a few small less than 1 cm in size in short axis left-sided hilar nodes are seen, not pathologic by size criteria and similar to previous. Areas of small mediastinal nodes identified. Example paratracheal on axial image 23 of series 2 measuring 14 by 10 mm. In retrospect on the prior this would have measured 15 by 12 mm measured in same fashion. Other small nodes are identified and similar. Slightly patulous esophagus. Lungs/Pleura: Tracheostomy tube  in place. There is wall thickening scattered along the trachea, similar to previous. Please correlate with any symptomatology. Scattered emphysematous lung changes are identified particularly in the upper lung zones. There is some breathing motion. The right lung is without consolidation or pneumothorax. There is some dependent atelectasis. 4 mm nodular area identified along the middle lobe on image 34 of series 2, unchanged from previous. Additional focus in the right upper lobe on image 29 is also stable measuring 3 mm. There is a left lower lobe nodule identified which is subpleural on image 38 of series 2 measuring 7 mm, unchanged when adjusting for/misregistration. Few other tiny nodules elsewhere in the left lung are stable. There is perihilar consolidative areas of opacity in the left upper lobe bronchiectasis as well some areas of patchy opacity in the left lower lobe which are similar. Adjacent tiny pleural effusion is also stable. Musculoskeletal: Scattered degenerative changes of the spine. CT ABDOMEN PELVIS FINDINGS Hepatobiliary: Mild fatty liver infiltration. Gallbladder is nondilated. No enhancing liver lesion. Patent portal vein. Pancreas: Pancreatic divisum noted with slight ectasia of the pancreatic duct, unchanged from previous. Mild global pancreatic atrophy. No enhancing  pancreatic mass. Spleen: Spleen is nonenlarged. Adrenals/Urinary Tract: Nonspecific mild thickening of the adrenal glands. The focal small nodule along the lateral limb of the left adrenal gland is less well-defined today. Mild bilateral renal atrophy. Stable benign-appearing 18 mm upper pole right-sided renal cyst. No specific imaging follow-up recommended. No collecting system dilatation. Exception is slight ectasia of the distal ureters, left-greater-than-right, nonspecific. There is slight asymmetric thickening of the urinary bladder particularly towards the posterior aspect and left. Please correlate for any known history. This is unchanged from prior examination. Stomach/Bowel: On this non oral contrast exam large bowel is of normal course and caliber with scattered stool. Left-sided colonic diverticula. Diffuse colonic stool, moderate. The stomach is nondilated, relatively collapsed. The small bowel is nondilated. The previous areas of nodular wall thickening along loops of small bowel in the right hemipelvis are not appreciated today but today's study is without oral contrast. Please correlate with clinical history. Please correlate with any prior workup. There is some distal ileal small bowel stool appearance, nonspecific. Vascular/Lymphatic: Scattered vascular calcifications. Normal caliber aorta and IVC. Calcifications include more prominently along the iliac vessels diffusely. No developing abnormal lymph node enlargement present in the abdomen pelvis. Reproductive: Brachytherapy changes in the area of the prostate. Other: No significant free fluid.  No free air. Musculoskeletal: Scattered degenerative changes of the spine and pelvis. Multilevel disc bulging and trace listhesis along the lumbar spine with some areas of stenosis. IMPRESSION: Stable appearance of left lung opacities with a trace effusion and small lung nodules. Tracheostomy tube with wall thickening along the trachea diffusely. Please  correlate with any symptoms. No bowel obstruction or free air. Colonic diverticula. The previous nodular wall thickening along the ileum on the prior examination is not well seen today today's study is without oral contrast. Please correlate with prior workup and history. If needed a follow-up oral contrast study can be performed as clinically directed. Brachytherapy changes of the prostate. Persistent heterogeneous asymmetric wall thickening of the urinary bladder with slight ectasia of the distal ureters, left-greater-than-right. Electronically Signed   By: Jill Side M.D.   On: 02/11/2022 14:24     ASSESSMENT/PLAN:  This is a very pleasant 82 year old [name] diagnosed with stage IV (T3, N2, M1a) non-small cell lung cancer, squamous cell carcinoma.  He presented with a left lower lobe obstructive  lung mass in addition to mediastinal lymphadenopathy.  He also presented with bilateral pulmonary nodules.  He was diagnosed in July 2021.  His PD-L1 expression is negative.   He completed palliative radiotherapy to the obstructive lung mass under the care of Dr. Lisbeth Renshaw in August 2021.   The patient is currently undergoing systemic chemotherapy with carboplatin and paclitaxel for 2 cycles in addition to immunotherapy with ipilimumab 1 mg/KG every 6 weeks and nivolumab 360 mg IV every 3 weeks.  He is status post 22 cycles.   Labs were reviewed. Recommend he proceed with day 22 cycle #22 as scheduled.   We will see him back for follow-up visit in 3 weeks for evaluation before starting the next cycle of treatment.    He will continue to follow up wth [facility] recommendations regarding the abnormal findings seen on prior CT scan. I reviewed their recommendations with the patient and his family today. They will call GI to follow up.   I also reviewed his last CT scan that was reviewed with Dr. Julien Nordmann on 02/16/22 which was stable.    The patient was advised to call immediately if he has any  concerning symptoms in the interval. The patient voices understanding of current disease status and treatment options and is in agreement with the current care plan. All questions were answered. The patient knows to call the clinic with any problems, questions or concerns. We can certainly see the patient much sooner if necessary        No orders of the defined types were placed in this encounter.   The total time spent in the appointment was 20-29 minutes.   Kinzlee Selvy L Fahed Morten, PA-C 03/09/22

## 2022-03-09 ENCOUNTER — Inpatient Hospital Stay: Payer: Medicare HMO

## 2022-03-09 ENCOUNTER — Other Ambulatory Visit: Payer: Self-pay

## 2022-03-09 ENCOUNTER — Inpatient Hospital Stay: Payer: Medicare HMO | Admitting: Physician Assistant

## 2022-03-09 VITALS — BP 140/70 | HR 70 | Temp 98.3°F | Resp 17 | Wt 192.7 lb

## 2022-03-09 DIAGNOSIS — Z95828 Presence of other vascular implants and grafts: Secondary | ICD-10-CM

## 2022-03-09 DIAGNOSIS — C3432 Malignant neoplasm of lower lobe, left bronchus or lung: Secondary | ICD-10-CM | POA: Diagnosis not present

## 2022-03-09 DIAGNOSIS — Z7984 Long term (current) use of oral hypoglycemic drugs: Secondary | ICD-10-CM | POA: Diagnosis not present

## 2022-03-09 DIAGNOSIS — E785 Hyperlipidemia, unspecified: Secondary | ICD-10-CM | POA: Diagnosis not present

## 2022-03-09 DIAGNOSIS — Z5112 Encounter for antineoplastic immunotherapy: Secondary | ICD-10-CM | POA: Diagnosis not present

## 2022-03-09 DIAGNOSIS — E119 Type 2 diabetes mellitus without complications: Secondary | ICD-10-CM | POA: Diagnosis not present

## 2022-03-09 DIAGNOSIS — Z79899 Other long term (current) drug therapy: Secondary | ICD-10-CM | POA: Diagnosis not present

## 2022-03-09 DIAGNOSIS — Z7982 Long term (current) use of aspirin: Secondary | ICD-10-CM | POA: Diagnosis not present

## 2022-03-09 DIAGNOSIS — I10 Essential (primary) hypertension: Secondary | ICD-10-CM | POA: Diagnosis not present

## 2022-03-09 LAB — CMP (CANCER CENTER ONLY)
ALT: 10 U/L (ref 0–44)
AST: 16 U/L (ref 15–41)
Albumin: 3.5 g/dL (ref 3.5–5.0)
Alkaline Phosphatase: 55 U/L (ref 38–126)
Anion gap: 4 — ABNORMAL LOW (ref 5–15)
BUN: 19 mg/dL (ref 8–23)
CO2: 28 mmol/L (ref 22–32)
Calcium: 9 mg/dL (ref 8.9–10.3)
Chloride: 106 mmol/L (ref 98–111)
Creatinine: 1.26 mg/dL — ABNORMAL HIGH (ref 0.61–1.24)
GFR, Estimated: 57 mL/min — ABNORMAL LOW (ref >60)
Glucose, Bld: 129 mg/dL — ABNORMAL HIGH (ref 70–99)
Potassium: 3.8 mmol/L (ref 3.5–5.1)
Sodium: 138 mmol/L (ref 135–145)
Total Bilirubin: 0.6 mg/dL (ref 0.3–1.2)
Total Protein: 8 g/dL (ref 6.5–8.1)

## 2022-03-09 LAB — CBC WITH DIFFERENTIAL (CANCER CENTER ONLY)
Abs Immature Granulocytes: 0.02 10*3/uL (ref 0.00–0.07)
Basophils Absolute: 0 10*3/uL (ref 0.0–0.1)
Basophils Relative: 0 %
Eosinophils Absolute: 0.8 10*3/uL — ABNORMAL HIGH (ref 0.0–0.5)
Eosinophils Relative: 12 %
HCT: 31.1 % — ABNORMAL LOW (ref 39.0–52.0)
Hemoglobin: 10.8 g/dL — ABNORMAL LOW (ref 13.0–17.0)
Immature Granulocytes: 0 %
Lymphocytes Relative: 15 %
Lymphs Abs: 1 10*3/uL (ref 0.7–4.0)
MCH: 32.3 pg (ref 26.0–34.0)
MCHC: 34.7 g/dL (ref 30.0–36.0)
MCV: 93.1 fL (ref 80.0–100.0)
Monocytes Absolute: 0.6 10*3/uL (ref 0.1–1.0)
Monocytes Relative: 9 %
Neutro Abs: 4.3 10*3/uL (ref 1.7–7.7)
Neutrophils Relative %: 64 %
Platelet Count: 259 10*3/uL (ref 150–400)
RBC: 3.34 MIL/uL — ABNORMAL LOW (ref 4.22–5.81)
RDW: 12.8 % (ref 11.5–15.5)
WBC Count: 6.7 10*3/uL (ref 4.0–10.5)
nRBC: 0 % (ref 0.0–0.2)

## 2022-03-09 MED ORDER — SODIUM CHLORIDE 0.9% FLUSH
10.0000 mL | Freq: Once | INTRAVENOUS | Status: AC
  Administered 2022-03-09: 10 mL

## 2022-03-09 MED ORDER — SODIUM CHLORIDE 0.9 % IV SOLN: Freq: Once | INTRAVENOUS | Status: AC

## 2022-03-09 MED ORDER — HEPARIN SOD (PORK) LOCK FLUSH 100 UNIT/ML IV SOLN
500.0000 [IU] | Freq: Once | INTRAVENOUS | Status: AC | PRN
  Administered 2022-03-09: 500 [IU]

## 2022-03-09 MED ORDER — SODIUM CHLORIDE 0.9% FLUSH
10.0000 mL | INTRAVENOUS | Status: DC | PRN
  Administered 2022-03-09: 10 mL

## 2022-03-09 MED ORDER — SODIUM CHLORIDE 0.9 % IV SOLN
360.0000 mg | Freq: Once | INTRAVENOUS | Status: AC
  Administered 2022-03-09: 360 mg via INTRAVENOUS
  Filled 2022-03-09: qty 24

## 2022-03-09 NOTE — Patient Instructions (Signed)
[facility]  Discharge Instructions: Thank you for choosing [facility] to provide your oncology and hematology care.   If you have a lab appointment with the [facility], please go directly to the [facility] and check in at the registration area.   Wear comfortable clothing and clothing appropriate for easy access to any Portacath or PICC line.   We strive to give you quality time with your provider. You may need to reschedule your appointment if you arrive late (15 or more minutes).  Arriving late affects you and other patients whose appointments are after yours.  Also, if you miss three or more appointments without notifying the office, you may be dismissed from the clinic at the provider's discretion.      For prescription refill requests, have your pharmacy contact our office and allow 72 hours for refills to be completed.    Today you received the following chemotherapy and/or immunotherapy agents: Opdivo      To help prevent nausea and vomiting after your treatment, we encourage you to take your nausea medication as directed.  BELOW ARE SYMPTOMS THAT SHOULD BE REPORTED IMMEDIATELY: *FEVER GREATER THAN 100.4 F (38 C) OR HIGHER *CHILLS OR SWEATING *NAUSEA AND VOMITING THAT IS NOT CONTROLLED WITH YOUR NAUSEA MEDICATION *UNUSUAL SHORTNESS OF BREATH *UNUSUAL BRUISING OR BLEEDING *URINARY PROBLEMS (pain or burning when urinating, or frequent urination) *BOWEL PROBLEMS (unusual diarrhea, constipation, pain near the anus) TENDERNESS IN MOUTH AND THROAT WITH OR WITHOUT PRESENCE OF ULCERS (sore throat, sores in mouth, or a toothache) UNUSUAL RASH, SWELLING OR PAIN  UNUSUAL VAGINAL DISCHARGE OR ITCHING   Items with * indicate a potential emergency and should be followed up as soon as possible or go to the Emergency Department if any problems should occur.  Please show the CHEMOTHERAPY ALERT CARD or IMMUNOTHERAPY ALERT CARD at check-in  to the Emergency Department and triage nurse.  Should you have questions after your visit or need to cancel or reschedule your appointment, please contact [facility]  Dept: [phone]  and follow the prompts.  Office hours are 8:00 a.m. to 4:30 p.m. Monday - Friday. Please note that voicemails left after 4:00 p.m. may not be returned until the following business day.  We are closed weekends and major holidays. You have access to a nurse at all times for urgent questions. Please call the main number to the clinic Dept: [phone] and follow the prompts.   For any non-urgent questions, you may also contact your provider using MyChart. We now offer e-Visits for anyone 45 and older to request care online for non-urgent symptoms. For details visit [email or web address].   Also download the MyChart app! Go to the app store, search "MyChart", open the app, select [facility], and log in with your MyChart username and password.

## 2022-03-10 ENCOUNTER — Other Ambulatory Visit: Payer: Self-pay

## 2022-03-10 ENCOUNTER — Telehealth: Payer: Self-pay

## 2022-03-10 DIAGNOSIS — R933 Abnormal findings on diagnostic imaging of other parts of digestive tract: Secondary | ICD-10-CM

## 2022-03-10 NOTE — Telephone Encounter (Signed)
Order placed for CT entero order placed. Message sent to radiology scheduling. Left message for pt to call back.

## 2022-03-10 NOTE — Telephone Encounter (Signed)
-----  Message from Willia Craze, NP sent at 03/09/2022 10:37 AM EST -----  Mickel Baas, please let the patient know that I discussed the CT scan bowel findings with Dr. Rush Landmark.  Please arrange for a CT enterography to evaluate the area of concern in the small bowel on previous CT scan. Thanks     ----- Message ----- From: Irving Copas., MD Sent: 03/05/2022   3:58 AM EST To: Willia Craze, NP     ----- Message ----- From: Willia Craze, NP Sent: 03/04/2022   5:19 PM EST To: Irving Copas., MD

## 2022-03-11 NOTE — Telephone Encounter (Signed)
Left message for pt to call back  °

## 2022-03-16 NOTE — Telephone Encounter (Signed)
Spoke with pt and pt's wife and let them know recommendations. Pt's wife stated they had not been contacted by radiology scheduling yet. Gave pt's wife number to radiology scheduling.

## 2022-03-21 ENCOUNTER — Inpatient Hospital Stay (HOSPITAL_COMMUNITY)
Admission: RE | Admit: 2022-03-21 | Discharge: 2022-03-21 | Disposition: A | Discharge status: Home / Self Care | Payer: Medicare HMO | Source: Ambulatory Visit

## 2022-03-21 DIAGNOSIS — Z930 Tracheostomy status: Secondary | ICD-10-CM

## 2022-03-21 DIAGNOSIS — J386 Stenosis of larynx: Secondary | ICD-10-CM | POA: Diagnosis present

## 2022-03-21 DIAGNOSIS — C3432 Malignant neoplasm of lower lobe, left bronchus or lung: Secondary | ICD-10-CM | POA: Diagnosis present

## 2022-03-21 NOTE — Progress Notes (Incomplete)
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  I saw him last 9/19. He continues to follow w/ Onc. He has completed palliative XRT 2021, Last CT imaging raised concern for possible mets to bowel he has just completed cycle 20 of 22 for chemo. Presents today for planned trach change  ROS Exam     Procedure   Upper airway exam with fiberoptic bronchoscopy.    Impression/plan  Active Problems:   Tracheostomy status ([place])   Malignant neoplasm of lower lobe of left lung ([place])   Glottic stenosis   Tracheostomy status ([place]) Overview:  Trach Brand: Shiley Size:  6.0 [id number] Style: Uncuffed Last change 2/12  Discussion Stable trach, not a candidate for trach decannulation d/t subglottic stenosis  Plan Cont routine trach care ROV 12 weeks      My time 20 min  Erick Colace ACNP-BC [facility] Pager # [phone] OR # [phone] if no answer

## 2022-03-23 ENCOUNTER — Ambulatory Visit (HOSPITAL_COMMUNITY)
Admission: RE | Admit: 2022-03-23 | Discharge: 2022-03-23 | Disposition: A | Discharge status: Home / Self Care | Payer: Medicare HMO | Source: Ambulatory Visit | Attending: Acute Care | Admitting: Acute Care

## 2022-03-23 ENCOUNTER — Ambulatory Visit (HOSPITAL_COMMUNITY): Payer: Medicare HMO

## 2022-03-23 DIAGNOSIS — Z930 Tracheostomy status: Secondary | ICD-10-CM

## 2022-03-23 DIAGNOSIS — J383 Other diseases of vocal cords: Secondary | ICD-10-CM | POA: Diagnosis not present

## 2022-03-23 DIAGNOSIS — J398 Other specified diseases of upper respiratory tract: Secondary | ICD-10-CM | POA: Diagnosis present

## 2022-03-23 DIAGNOSIS — J386 Stenosis of larynx: Secondary | ICD-10-CM | POA: Diagnosis present

## 2022-03-23 NOTE — Progress Notes (Signed)
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  I saw him last 9/19. He continues to follow w/ Onc. He has completed palliative XRT 2021, Last CT imaging raised concern for possible mets to bowel he has just completed cycle 20 of 22 for chemo. Presents today for planned trach change on 2/14  Review of Systems  Constitutional: Negative.   HENT: Negative.    Eyes: Negative.   Respiratory: Negative.    Cardiovascular: Negative.   Gastrointestinal: Negative.   Genitourinary: Negative.   Musculoskeletal: Negative.   Skin: Negative.   All other systems reviewed and are negative.  Exam  General 82 year old male. He is ambulatory and in no distress  HENT NCAT # 6 cuffless shiley. Good audible phonation w/ PMV Pulm clear dec bases Card rrr Abd soft  Ext warm and dry  Neuro oriented  Procedure  The # 6 trach was removed. Site inspected. Looks good. New trach site placed over obturator. Site verified via ETCo2  Impression/plan  Active Problems:   Tracheostomy status ([place])   Glottic stenosis   Vocal cord dysfunction   Trachea, stenosis   Tracheostomy status ([place]) Overview:  Trach Brand: Shiley Size:  6.0 6UN75R Style: Uncuffed Last change 2/14 Discussion Stable trach, not a candidate for trach decannulation d/t subglottic stenosis  Plan Cont routine trach care ROV 12 weeks      My time 20 min  Erick Colace ACNP-BC [place] [facility] Pager # [phone] OR # [phone] if no answer

## 2022-03-23 NOTE — Progress Notes (Addendum)
Tracheostomy Procedure Note  Shaun Kennedy [id number] February 24, 1940  Pre Procedure Tracheostomy Information  Trach Brand: Shiley Size:  6.0 [id number] Style: Uncuffed Secured by: Velcro   Procedure: Trach Change and Trach cleaning    Post Procedure Tracheostomy Information  Trach Brand: Shiley Size:  6.0 1SR15X Style: Uncuffed Secured by: Velcro   Post Procedure Evaluation:  ETCO2 positive color change from yellow to purple : Yes.   Vital signs:VSS Patients current condition: stable Complications: No apparent complications Trach site exam: clean, dry Wound care done: 4 x 4 gauze drain Patient did tolerate procedure well.   Education: none  Prescription needs: none    Additional needs: Patient given new PMV at this visit

## 2022-03-28 ENCOUNTER — Ambulatory Visit (HOSPITAL_COMMUNITY)
Admission: RE | Admit: 2022-03-28 | Discharge: 2022-03-28 | Disposition: A | Discharge status: Home / Self Care | Payer: Medicare HMO | Source: Ambulatory Visit | Attending: Nurse Practitioner | Admitting: Nurse Practitioner

## 2022-03-28 DIAGNOSIS — R933 Abnormal findings on diagnostic imaging of other parts of digestive tract: Secondary | ICD-10-CM | POA: Diagnosis not present

## 2022-03-28 DIAGNOSIS — K6389 Other specified diseases of intestine: Secondary | ICD-10-CM | POA: Diagnosis not present

## 2022-03-28 DIAGNOSIS — N2882 Megaloureter: Secondary | ICD-10-CM | POA: Diagnosis not present

## 2022-03-28 DIAGNOSIS — N3289 Other specified disorders of bladder: Secondary | ICD-10-CM | POA: Diagnosis not present

## 2022-03-28 DIAGNOSIS — K5730 Diverticulosis of large intestine without perforation or abscess without bleeding: Secondary | ICD-10-CM | POA: Diagnosis not present

## 2022-03-28 MED ORDER — IOHEXOL 300 MG/ML  SOLN
100.0000 mL | Freq: Once | INTRAMUSCULAR | Status: AC | PRN
  Administered 2022-03-28: 100 mL via INTRAVENOUS

## 2022-03-30 ENCOUNTER — Inpatient Hospital Stay: Payer: Medicare HMO

## 2022-03-30 ENCOUNTER — Inpatient Hospital Stay: Payer: Medicare HMO | Attending: Internal Medicine

## 2022-03-30 ENCOUNTER — Encounter: Payer: Self-pay | Admitting: Medical Oncology

## 2022-03-30 ENCOUNTER — Inpatient Hospital Stay: Payer: Medicare HMO | Admitting: Internal Medicine

## 2022-03-30 VITALS — BP 109/74 | HR 79 | Temp 98.3°F | Resp 18 | Wt 189.8 lb

## 2022-03-30 DIAGNOSIS — E119 Type 2 diabetes mellitus without complications: Secondary | ICD-10-CM | POA: Insufficient documentation

## 2022-03-30 DIAGNOSIS — C3490 Malignant neoplasm of unspecified part of unspecified bronchus or lung: Secondary | ICD-10-CM

## 2022-03-30 DIAGNOSIS — I10 Essential (primary) hypertension: Secondary | ICD-10-CM | POA: Insufficient documentation

## 2022-03-30 DIAGNOSIS — C3432 Malignant neoplasm of lower lobe, left bronchus or lung: Secondary | ICD-10-CM

## 2022-03-30 DIAGNOSIS — Z79899 Other long term (current) drug therapy: Secondary | ICD-10-CM | POA: Diagnosis not present

## 2022-03-30 DIAGNOSIS — Z7982 Long term (current) use of aspirin: Secondary | ICD-10-CM | POA: Diagnosis not present

## 2022-03-30 DIAGNOSIS — Z87891 Personal history of nicotine dependence: Secondary | ICD-10-CM | POA: Insufficient documentation

## 2022-03-30 DIAGNOSIS — Z95828 Presence of other vascular implants and grafts: Secondary | ICD-10-CM

## 2022-03-30 DIAGNOSIS — Z7984 Long term (current) use of oral hypoglycemic drugs: Secondary | ICD-10-CM | POA: Insufficient documentation

## 2022-03-30 DIAGNOSIS — J386 Stenosis of larynx: Secondary | ICD-10-CM | POA: Diagnosis not present

## 2022-03-30 DIAGNOSIS — Z930 Tracheostomy status: Secondary | ICD-10-CM | POA: Diagnosis not present

## 2022-03-30 DIAGNOSIS — Z9221 Personal history of antineoplastic chemotherapy: Secondary | ICD-10-CM | POA: Insufficient documentation

## 2022-03-30 DIAGNOSIS — J45909 Unspecified asthma, uncomplicated: Secondary | ICD-10-CM | POA: Insufficient documentation

## 2022-03-30 LAB — CBC WITH DIFFERENTIAL (CANCER CENTER ONLY)
Abs Immature Granulocytes: 0.01 10*3/uL (ref 0.00–0.07)
Basophils Absolute: 0 10*3/uL (ref 0.0–0.1)
Basophils Relative: 0 %
Eosinophils Absolute: 0.8 10*3/uL — ABNORMAL HIGH (ref 0.0–0.5)
Eosinophils Relative: 13 %
HCT: 31.4 % — ABNORMAL LOW (ref 39.0–52.0)
Hemoglobin: 10.8 g/dL — ABNORMAL LOW (ref 13.0–17.0)
Immature Granulocytes: 0 %
Lymphocytes Relative: 15 %
Lymphs Abs: 0.9 10*3/uL (ref 0.7–4.0)
MCH: 32 pg (ref 26.0–34.0)
MCHC: 34.4 g/dL (ref 30.0–36.0)
MCV: 93.2 fL (ref 80.0–100.0)
Monocytes Absolute: 0.5 10*3/uL (ref 0.1–1.0)
Monocytes Relative: 9 %
Neutro Abs: 3.8 10*3/uL (ref 1.7–7.7)
Neutrophils Relative %: 63 %
Platelet Count: 277 10*3/uL (ref 150–400)
RBC: 3.37 MIL/uL — ABNORMAL LOW (ref 4.22–5.81)
RDW: 12.4 % (ref 11.5–15.5)
WBC Count: 6 10*3/uL (ref 4.0–10.5)
nRBC: 0 % (ref 0.0–0.2)

## 2022-03-30 LAB — CMP (CANCER CENTER ONLY)
ALT: 13 U/L (ref 0–44)
AST: 20 U/L (ref 15–41)
Albumin: 3.6 g/dL (ref 3.5–5.0)
Alkaline Phosphatase: 52 U/L (ref 38–126)
Anion gap: 5 (ref 5–15)
BUN: 23 mg/dL (ref 8–23)
CO2: 28 mmol/L (ref 22–32)
Calcium: 8.8 mg/dL — ABNORMAL LOW (ref 8.9–10.3)
Chloride: 107 mmol/L (ref 98–111)
Creatinine: 1.45 mg/dL — ABNORMAL HIGH (ref 0.61–1.24)
GFR, Estimated: 48 mL/min — ABNORMAL LOW (ref >60)
Glucose, Bld: 112 mg/dL — ABNORMAL HIGH (ref 70–99)
Potassium: 3.9 mmol/L (ref 3.5–5.1)
Sodium: 140 mmol/L (ref 135–145)
Total Bilirubin: 0.6 mg/dL (ref 0.3–1.2)
Total Protein: 8 g/dL (ref 6.5–8.1)

## 2022-03-30 LAB — TSH: TSH: 1.216 u[IU]/mL (ref 0.350–4.500)

## 2022-03-30 MED ORDER — HEPARIN SOD (PORK) LOCK FLUSH 100 UNIT/ML IV SOLN
500.0000 [IU] | Freq: Once | INTRAVENOUS | Status: AC
  Administered 2022-03-30: 500 [IU]

## 2022-03-30 MED ORDER — SODIUM CHLORIDE 0.9% FLUSH
10.0000 mL | Freq: Once | INTRAVENOUS | Status: AC
  Administered 2022-03-30: 10 mL

## 2022-03-30 NOTE — Progress Notes (Signed)
NO treatment today

## 2022-03-30 NOTE — Progress Notes (Signed)
[facility] Telephone:([phone]) [phone]   Fax:(336) [phone]  OFFICE PROGRESS NOTE  [name], [address] [place] [address]  DIAGNOSIS: stage IV (T3, N2, M1 a) non-small cell lung cancer, squamous cell carcinoma presented with obstructive left lower lobe lung mass in addition to mediastinal lymphadenopathy as well as bilateral pulmonary nodules diagnosed in July 2021.   PDL1: 0%   PRIOR THERAPY:  1) Palliative radiotherapy to the obstructive left lower lobe lung mass under the care of Dr. Lisbeth Renshaw.  2)  systemic chemotherapy 2 cycles of chemotherapy with carboplatin for an AUC of 5 and paclitaxel 175 mg/m2 in addition to immunotherapy with nivolumab 360 mg every 3 weeks and ipilimumab 1 mg/kg IV every 6 weeks followed by maintenance nivolumab and ipilimumab.  He started the first treatment on 09/04/2019.  He is status post 22 cycles.  Last was giving on February 16, 2022.  This treatment was discontinued after the patient completed more than 2 years on the immunotherapy.   CURRENT THERAPY: Observation  INTERVAL HISTORY: Shaun Kennedy 82 y.o. male returns to the clinic today for follow-up visit.  The patient is feeling fine today with no concerning complaints except for the mild shortness of breath with exertion.  He denied having any chest pain, cough or hemoptysis.  He has no nausea, vomiting, diarrhea or constipation.  He has no headache or visual changes.  He denied having any recent weight loss or night sweats.  He has been tolerating his treatment with immunotherapy fairly well.  The patient was here today for evaluation before starting cycle #23.  MEDICAL HISTORY: Past Medical History:  Diagnosis Date   Allergic rhinitis    Asthma    Carotid stenosis    Colon cancer ([place]) 2003   Diabetes mellitus ([place])    Diverticulosis    Dyslipidemia    ED (erectile dysfunction)    GERD (gastroesophageal reflux disease)    H/O degenerative disc disease     Hemorrhoids    HTN (hypertension)    as a child   Hyperlipidemia    Lung cancer ([place])    LVH (left ventricular hypertrophy)    on EKG   Mass of lower lobe of left lung    Mediastinal adenopathy    Smoker    former   Wears dentures    Wears dentures    Wears glasses    Wears glasses     ALLERGIES:  has No Known Allergies.  MEDICATIONS:  Current Outpatient Medications  Medication Sig Dispense Refill   Alcohol Swabs (DROPSAFE ALCOHOL PREP) 70 % PADS Apply topically.     amLODipine (NORVASC) 5 MG tablet TAKE 1 TABLET EVERY DAY 90 tablet 3   aspirin EC 81 MG tablet Take 81 mg by mouth daily after breakfast.      betamethasone valerate ointment (VALISONE) 0.1 % APPLY 1 APPLICATION TOPICALLY 2 TIMES DAILY 45 g 0   bisacodyl (DULCOLAX) 5 MG EC tablet Take 10 mg by mouth daily as needed for moderate constipation.     Blood Glucose Calibration (TRUE METRIX LEVEL 1) Low SOLN      Blood Glucose Monitoring Suppl (TRUE METRIX AIR GLUCOSE METER) w/Device KIT USE AS DIRECTED 1 kit 0   Borage, Borago officinalis, (BORAGE OIL PO) Take 2,300 mg by mouth.     cholecalciferol (VITAMIN D) 25 MCG (1000 UNIT) tablet Take 1,000 Units by mouth daily.     donepezil (ARICEPT) 5 MG tablet  TAKE 1 TABLET AT BEDTIME 90 tablet 0   dronabinol (MARINOL) 2.5 MG capsule Take 1 capsule (2.5 mg total) by mouth 2 (two) times daily before a meal. 60 capsule 1   ferrous sulfate 325 (65 FE) MG EC tablet TAKE 1 TABLET (325 MG TOTAL) DAILY WITH BREAKFAST. 90 tablet 1   gabapentin (NEURONTIN) 100 MG capsule Take by mouth.     hydrOXYzine (ATARAX) 10 MG tablet Take 1 tablet (10 mg total) by mouth 3 (three) times daily as needed. 90 tablet 1   Lancet Devices (TRUEDRAW LANCING DEVICE) MISC Check sugars daily 90 each 0   linaclotide (LINZESS) 72 MCG capsule Take by mouth.     lisinopril-hydrochlorothiazide (ZESTORETIC) 20-12.5 MG tablet TAKE 1 TABLET EVERY DAY 90 tablet 0   metFORMIN (GLUCOPHAGE-XR) 500 MG 24 hr tablet TAKE  1 TABLET EVERY DAY 90 tablet 0   Multiple Vitamin (MULTIVITAMIN WITH MINERALS) TABS tablet Take 1 tablet by mouth daily after breakfast.      simvastatin (ZOCOR) 40 MG tablet TAKE 1 TABLET EVERY DAY 90 tablet 1   solifenacin (VESICARE) 5 MG tablet TAKE 1 TABLET EVERY DAY 90 tablet 1   TRUE METRIX BLOOD GLUCOSE TEST test strip TEST BLOOD GLUCOSE  UP TO TWO TIMES DAILY 200 strip 2   TRUEplus Lancets 33G MISC 1 each by Does not apply route 2 (two) times daily. 200 each 4   No current facility-administered medications for this visit.    SURGICAL HISTORY:  Past Surgical History:  Procedure Laterality Date   BRONCHIAL BIOPSY  08/20/2019   Procedure: BRONCHIAL BIOPSIES;  Surgeon: Collene Gobble, MD;  Location[facility] ENDOSCOPY;  Service: Pulmonary;;   BRONCHIAL BRUSHINGS  08/20/2019   Procedure: BRONCHIAL BRUSHINGS;  Surgeon: Collene Gobble, MD;  Location[facility] ENDOSCOPY;  Service: Pulmonary;;   BRONCHIAL NEEDLE ASPIRATION BIOPSY  08/20/2019   Procedure: BRONCHIAL NEEDLE ASPIRATION BIOPSIES;  Surgeon: Collene Gobble, MD;  Location[facility] ENDOSCOPY;  Service: Pulmonary;;   CARPAL TUNNEL RELEASE Right 01/09/2019   Procedure: CARPAL TUNNEL RELEASE;  Surgeon: Leandrew Koyanagi, MD;  Location: [facility];  Service: Orthopedics;  Laterality: Right;   CARPAL TUNNEL WITH CUBITAL TUNNEL Right 11/18/2020   Procedure: CARPAL TUNNEL WITH CUBITAL TUNNEL;  Surgeon: Charlotte Crumb, MD;  Location: [facility];  Service: Orthopedics;  Laterality: Right;   CATARACT EXTRACTION Right 2018   COLONOSCOPY  2007   Dr. Benson Norway   DIRECT LARYNGOSCOPY N/A 04/10/2020   Procedure: DIRECT LARYNGOSCOPY;  Surgeon: Melida Quitter, MD;  Location: [facility];  Service: ENT;  Laterality: N/A;   I & D EXTREMITY Right 04/02/2016   Procedure: IRRIGATION AND DEBRIDEMENT GREAT TOE;  Surgeon: Leandrew Koyanagi, MD;  Location: [facility];  Service: Orthopedics;  Laterality: Right;   IR IMAGING GUIDED PORT INSERTION  08/30/2019   MULTIPLE TOOTH EXTRACTIONS      RADIOACTIVE SEED IMPLANT  2003   TRACHEOSTOMY TUBE PLACEMENT N/A 04/10/2020   Procedure: TRACHEOSTOMY;  Surgeon: Melida Quitter, MD;  Location: [facility];  Service: ENT;  Laterality: N/A;   ULNAR TUNNEL RELEASE Right 01/09/2019   Procedure: RIGHT CUBITAL TUNNEL RELEASE AND CARPAL TUNNEL RELEASE;  Surgeon: Leandrew Koyanagi, MD;  Location: [facility];  Service: Orthopedics;  Laterality: Right;   UPPER GASTROINTESTINAL ENDOSCOPY     VIDEO BRONCHOSCOPY N/A 12/18/2019   Procedure: VIDEO BRONCHOSCOPY WITHOUT FLUORO;  Surgeon: Collene Gobble, MD;  Location: [facility] ENDOSCOPY;  Service: Cardiopulmonary;  Laterality: N/A;   VIDEO  BRONCHOSCOPY WITH ENDOBRONCHIAL ULTRASOUND N/A 08/20/2019   Procedure: VIDEO BRONCHOSCOPY WITH ENDOBRONCHIAL ULTRASOUND;  Surgeon: Collene Gobble, MD;  Location[facility] ENDOSCOPY;  Service: Pulmonary;  Laterality: N/A;    REVIEW OF SYSTEMS:  Constitutional: positive for fatigue Eyes: negative Ears, nose, mouth, throat, and face: negative Respiratory: positive for dyspnea on exertion Cardiovascular: negative Gastrointestinal: negative Genitourinary:negative Integument/breast: negative Hematologic/lymphatic: negative Musculoskeletal:negative Neurological: negative Behavioral/Psych: negative Endocrine: negative Allergic/Immunologic: negative   PHYSICAL EXAMINATION: General appearance: alert, cooperative, fatigued, and no distress Head: Normocephalic, without obvious abnormality, atraumatic Neck: no adenopathy, no JVD, supple, symmetrical, trachea midline, and thyroid not enlarged, symmetric, no tenderness/mass/nodules Lymph nodes: Cervical, supraclavicular, and axillary nodes normal. Resp: clear to auscultation bilaterally Back: symmetric, no curvature. ROM normal. No CVA tenderness. Cardio: regular rate and rhythm, S1, S2 normal, no murmur, click, rub or gallop GI: soft, non-tender; bowel sounds normal; no masses,  no organomegaly Extremities: extremities normal,  atraumatic, no cyanosis or edema Neurologic: Alert and oriented X 3, normal strength and tone. Normal symmetric reflexes. Normal coordination and gait  ECOG PERFORMANCE STATUS: 1 - Symptomatic but completely ambulatory  Blood pressure 109/74, pulse 79, temperature 98.3 F (36.8 C), temperature source Oral, resp. rate 18, weight 189 lb 12.8 oz (86.1 kg), SpO2 100 %.  LABORATORY DATA: Lab Results  Component Value Date   WBC 6.0 03/30/2022   HGB 10.8 (L) 03/30/2022   HCT 31.4 (L) 03/30/2022   MCV 93.2 03/30/2022   PLT 277 03/30/2022      Chemistry      Component Value Date/Time   NA 138 03/09/2022 0941   NA 146 (H) 10/04/2021 1616   K 3.8 03/09/2022 0941   CL 106 03/09/2022 0941   CO2 28 03/09/2022 0941   BUN 19 03/09/2022 0941   BUN 12 10/04/2021 1616   CREATININE 1.26 (H) 03/09/2022 0941   CREATININE 1.25 (H) 08/08/2016 0912      Component Value Date/Time   CALCIUM 9.0 03/09/2022 0941   ALKPHOS 55 03/09/2022 0941   AST 16 03/09/2022 0941   ALT 10 03/09/2022 0941   BILITOT 0.6 03/09/2022 0941       RADIOGRAPHIC STUDIES: CT ENTERO ABD/PELVIS W CONTAST  Result Date: 03/29/2022 CLINICAL DATA:  Abnormal small bowel on CT scan. History of non-small-cell lung cancer. EXAM: CT ABDOMEN AND PELVIS WITH CONTRAST (ENTEROGRAPHY) TECHNIQUE: Multidetector CT of the abdomen and pelvis during bolus administration of intravenous contrast. Negative oral contrast was given. RADIATION DOSE REDUCTION: This exam was performed according to the departmental dose-optimization program which includes automated exposure control, adjustment of the mA and/or kV according to patient size and/or use of iterative reconstruction technique. CONTRAST:  145mL OMNIPAQUE IOHEXOL 300 MG/ML  SOLN COMPARISON:  CT 02/10/2022 and older. FINDINGS: Lower chest: There is some linear opacity lung bases likely scar or atelectasis. There is a subpleural nodular area once again seen left lower lobe which is stable from prior  exam measuring 7 mm on series 5, image 3. Coronary artery calcifications are seen. Patulous fluid-filled distal esophagus. Hepatobiliary: No space-occupying liver lesion. Patent portal vein. The gallbladder is nondilated. Pancreas: No mass, inflammatory changes, or other significant abnormality. Spleen: Within normal limits in size and appearance. Adrenals/Urinary Tract: Stable small right parapelvic simple renal cyst and some tiny parenchymal cysts on the left. No specific imaging follow-up. Extrarenal pelvis noted to each kidney. No enhancing renal mass. No collecting system dilatation. Minimal ectasia of the distal left ureter, nonspecific. The bladder wall is thickened and slightly trabeculated.  There is enlarged prostate with brachytherapy changes. Stomach/Bowel: The stomach is distended with contrast. No fold thickening or abnormal enhancement. Minimal thickening in the area of the pylorus. The duodenal bulb, C-loop and sweep has a normal course and caliber. Jejunum is nondilated. No mucosal hyperenhancement. No inflammatory changes. There is a short segment of mild fold thickening along the proximal jejunum as seen on coronal series 6, image 28 and axial image 89 of series 3. The ileum is nondilated. No abnormal enhancement of the ileum or inflammatory changes. On the study of October 2023 there were nodular areas of fold thickening identified along mid ileal loops in the right lower quadrant. This is resolved. No new areas identified at this time. The large bowel has a normal course and caliber. Left-sided colonic diverticulosis. Overall moderate colonic stool. Normal appendix in the right lower quadrant. Vascular/Lymphatic: Diffuse vascular calcifications identified. Greatest along the internal iliac vessels. Normal caliber aorta and IVC. There is significant calcified plaque as well along the SMA with stenosis of 50-60%. The inferior mesenteric artery is enhancing. Calcifications along the renal arteries  as well. No developing lymph node enlargement identified in the abdomen and pelvis. Reproductive: Scrotal hydroceles. Brachytherapy changes along the prostate. Other: None Musculoskeletal: Diffuse degenerative changes of the spine and pelvis. Multilevel stenosis along the lumbar spine. IMPRESSION: No bowel obstruction, free air or free fluid. The area of nodular wall thickening along loops of ileum on the study of October 2023 is resolved as per today's enterography examination. There is a short loop of jejunum which has some subtle fold thickening, similar to previous when adjusted for technique and level of distention and luminal contrast. Simple attention on follow-up. No inflammatory changes. Colonic diverticulosis. Persistent wall thickening of the urinary bladder with brachytherapy changes along the prostate. Stable slight ectasia of the distal ureters. Nonspecific Electronically Signed   By: Jill Side M.D.   On: 03/29/2022 16:01    ASSESSMENT AND PLAN: This is a very pleasant 82 years old [name] with stage IV non-small cell lung cancer, squamous cell carcinoma with negative PD-L1 expression. The patient is currently undergoing systemic chemotherapy with carboplatin and paclitaxel for 2 cycles in addition to immunotherapy with ipilimumab 1 mg/KG every 6 weeks and nivolumab 360 mg IV every 3 weeks status post 22  cycles. The patient has been on this treatment for more than 2 years with no concerning complaints. I had a lengthy discussion with the patient today about his condition and treatment options. I gave the patient the option of discontinuing his treatment with immunotherapy at this point since he completed more than 2 years of treatment and there is no evidence for disease progression.  The patient was interested in this option.  He will not receive cycle #23 today. I will see him back for follow-up visit in 2 months for evaluation with repeat CT scan of the chest, abdomen and  pelvis for restaging of his disease. He was advised to call immediately if he has any other concerning symptoms in the interval. The patient voices understanding of current disease status and treatment options and is in agreement with the current care plan.  All questions were answered. The patient knows to call the clinic with any problems, questions or concerns. We can certainly see the patient much sooner if necessary. The total time spent in the appointment was 30 minutes.  Disclaimer: This note was dictated with voice recognition software. Similar sounding words can inadvertently be transcribed and may not be  corrected upon review.

## 2022-04-01 LAB — T4: T4, Total: 8 ug/dL (ref 4.5–12.0)

## 2022-04-04 ENCOUNTER — Ambulatory Visit: Payer: Self-pay

## 2022-04-04 NOTE — Patient Outreach (Signed)
  Care Coordination   Initial Visit Note   04/04/2022 Name: Shaun Kennedy MRN: [id number] DOB: 27-Jul-1940  Shaun Kennedy is a 82 y.o. year old male who sees [name], MD for primary care. I spoke with  Shaun Kennedy by phone today.  What matters to the patients health and wellness today?  Patient will continue to adhere to his diabetic diet. He will increase his water intake as directed.     Goals Addressed             This Visit's Progress    To keep A1c <7.0 %       Care Coordination Interventions: Provided education to patient about basic DM disease process Reviewed medications with patient and discussed importance of medication adherence Advised patient, providing education and rationale, to check cbg daily before meals and at bedtime and record, calling PCP for findings outside established parameters Review of patient status, including review of consultants reports, relevant laboratory and other test results, and medications completed Counseled on Diabetic diet, my plate method, portion control  Mailed printed educational materials related to Diabetes Management   Lab Results  Component Value Date   HGBA1C 6.5 (A) 03/31/2021          To maintain current cancer status       Care Coordination Interventions: Assessed patient understanding of cancer diagnosis and recommended treatment plan Reviewed upcoming provider appointments and treatment appointments Assessed available transportation to appointments and treatments. Has consistent/reliable transportation: Yes Assessed support system. Has consistent/reliable family or other support: Yes       Interventions Today    Flowsheet Row Most Recent Value  Chronic Disease   Chronic disease during today's visit Diabetes, Other, Chronic Kidney Disease/End Stage Renal Disease (ESRD)  [lung CA]  General Interventions   General Interventions Discussed/Reviewed General Interventions Discussed, General Interventions  Reviewed, Doctor Visits  Doctor Visits Discussed/Reviewed Annual Wellness Visits, Doctor Visits Discussed  Nutrition Interventions   Nutrition Discussed/Reviewed Carbohydrate meal planning, Portion sizes, Fluid intake  [Mailed printed educational materials]          SDOH assessments and interventions completed:  No  SDOH Interventions Today    Flowsheet Row Most Recent Value  SDOH Interventions   Transportation Interventions Intervention Not Indicated        Care Coordination Interventions:  Yes, provided   Follow up plan: Follow up call scheduled for 06/03/22 @09 :30 AM    Encounter Outcome:  Pt. Visit Completed

## 2022-04-04 NOTE — Patient Instructions (Signed)
Visit Information  Thank you for taking time to visit with me today. Please don't hesitate to contact me if I can be of assistance to you.   Following are the goals we discussed today:   Goals Addressed             This Visit's Progress    To keep A1c <7.0 %       Care Coordination Interventions: Provided education to patient about basic DM disease process Reviewed medications with patient and discussed importance of medication adherence Advised patient, providing education and rationale, to check cbg daily before meals and at bedtime and record, calling PCP for findings outside established parameters Review of patient status, including review of consultants reports, relevant laboratory and other test results, and medications completed Counseled on Diabetic diet, my plate method, portion control  Mailed printed educational materials related to Diabetes Management   Lab Results  Component Value Date   HGBA1C 6.5 (A) 03/31/2021          To maintain current cancer status       Care Coordination Interventions: Assessed patient understanding of cancer diagnosis and recommended treatment plan Reviewed upcoming provider appointments and treatment appointments Assessed available transportation to appointments and treatments. Has consistent/reliable transportation: Yes Assessed support system. Has consistent/reliable family or other support: Yes           Our next appointment is by telephone on 06/03/22 at 09:30 AM  Please call the care guide team at [phone] if you need to cancel or reschedule your appointment.   If you are experiencing a Mental Health or [place] or need someone to talk to, please call 1-800-273-TALK (toll free, 24 hour hotline) go to [facility] [address] [place] [phone])  Patient verbalizes understanding of instructions and care plan provided today and agrees to view in [place]. Active  MyChart status and patient understanding of how to access instructions and care plan via MyChart confirmed with patient.     Barb Merino, RN, BSN, CCM Care Management Coordinator [facility]  Direct Phone: [phone]

## 2022-04-06 ENCOUNTER — Telehealth: Payer: Self-pay | Admitting: Family Medicine

## 2022-04-06 NOTE — Telephone Encounter (Signed)
[place] to schedule their annual wellness visit. Appointment made for 03/31/21.  [facility] [employer] [facility] direct phone # [phone]

## 2022-04-11 ENCOUNTER — Ambulatory Visit: Payer: Medicare HMO | Admitting: Family Medicine

## 2022-04-19 ENCOUNTER — Telehealth: Payer: Self-pay

## 2022-04-19 ENCOUNTER — Ambulatory Visit (INDEPENDENT_AMBULATORY_CARE_PROVIDER_SITE_OTHER): Payer: Medicare HMO

## 2022-04-19 VITALS — Ht 67.0 in | Wt 189.0 lb

## 2022-04-19 DIAGNOSIS — Z0000 Encounter for general adult medical examination without abnormal findings: Secondary | ICD-10-CM | POA: Diagnosis not present

## 2022-04-19 NOTE — Patient Instructions (Signed)
Shaun Kennedy , Thank you for taking time to come for your Medicare Wellness Visit. I appreciate your ongoing commitment to your health goals. Please review the following plan we discussed and let me know if I can assist you in the future.   These are the goals we discussed:  Goals      Obtain Supportive Resources     Patient Stated     04/19/2022, no goals     To keep A1c <7.0 %     Care Coordination Interventions: Provided education to patient about basic DM disease process Reviewed medications with patient and discussed importance of medication adherence Advised patient, providing education and rationale, to check cbg daily before meals and at bedtime and record, calling PCP for findings outside established parameters Review of patient status, including review of consultants reports, relevant laboratory and other test results, and medications completed Counseled on Diabetic diet, my plate method, portion control  Mailed printed educational materials related to Diabetes Management   Lab Results  Component Value Date   HGBA1C 6.5 (A) 03/31/2021          To maintain current cancer status     Care Coordination Interventions: Assessed patient understanding of cancer diagnosis and recommended treatment plan Reviewed upcoming provider appointments and treatment appointments Assessed available transportation to appointments and treatments. Has consistent/reliable transportation: Yes Assessed support system. Has consistent/reliable family or other support: Yes           This is a list of the screening recommended for you and due dates:  Health Maintenance  Topic Date Due   Yearly kidney health urinalysis for diabetes  10/17/2019   Complete foot exam   07/30/2021   Hemoglobin A1C  09/28/2021   COVID-19 Vaccine (6 - 2023-24 season) 01/17/2022   Eye exam for diabetics  02/04/2022   Yearly kidney function blood test for diabetes  03/31/2023   Medicare Annual Wellness Visit   04/19/2023   DTaP/Tdap/Td vaccine (3 - Td or Tdap) 10/30/2029   Pneumonia Vaccine  Completed   Flu Shot  Completed   Zoster (Shingles) Vaccine  Completed   HPV Vaccine  Aged Out    Advanced directives: Please bring a copy of your POA (Power of [place]) and/or Living Will to your next appointment.   Conditions/risks identified: none  Next appointment: Follow up in one year for your annual wellness visit.   Preventive Care 57 Years and Older, Male  Preventive care refers to lifestyle choices and visits with your health care provider that can promote health and wellness. What does preventive care include? A yearly physical exam. This is also called an annual well check. Dental exams once or twice a year. Routine eye exams. Ask your health care provider how often you should have your eyes checked. Personal lifestyle choices, including: Daily care of your teeth and gums. Regular physical activity. Eating a healthy diet. Avoiding tobacco and drug use. Limiting alcohol use. Practicing safe sex. Taking low doses of aspirin every day. Taking vitamin and mineral supplements as recommended by your health care provider. What happens during an annual well check? The services and screenings done by your health care provider during your annual well check will depend on your age, overall health, lifestyle risk factors, and family history of disease. Counseling  Your health care provider may ask you questions about your: Alcohol use. Tobacco use. Drug use. Emotional well-being. Home and relationship well-being. Sexual activity. Eating habits. History of falls. Memory and ability to understand (cognition).  Work and work Statistician. Screening  You may have the following tests or measurements: Height, weight, and BMI. Blood pressure. Lipid and cholesterol levels. These may be checked every 5 years, or more frequently if you are over 20 years old. Skin check. Lung cancer screening. You  may have this screening every year starting at age 42 if you have a 30-pack-year history of smoking and currently smoke or have quit within the past 15 years. Fecal occult blood test (FOBT) of the stool. You may have this test every year starting at age 54. Flexible sigmoidoscopy or colonoscopy. You may have a sigmoidoscopy every 5 years or a colonoscopy every 10 years starting at age 94. Prostate cancer screening. Recommendations will vary depending on your family history and other risks. Hepatitis C blood test. Hepatitis B blood test. Sexually transmitted disease (STD) testing. Diabetes screening. This is done by checking your blood sugar (glucose) after you have not eaten for a while (fasting). You may have this done every 1-3 years. Abdominal aortic aneurysm (AAA) screening. You may need this if you are a current or former smoker. Osteoporosis. You may be screened starting at age 69 if you are at high risk. Talk with your health care provider about your test results, treatment options, and if necessary, the need for more tests. Vaccines  Your health care provider may recommend certain vaccines, such as: Influenza vaccine. This is recommended every year. Tetanus, diphtheria, and acellular pertussis (Tdap, Td) vaccine. You may need a Td booster every 10 years. Zoster vaccine. You may need this after age 63. Pneumococcal 13-valent conjugate (PCV13) vaccine. One dose is recommended after age 19. Pneumococcal polysaccharide (PPSV23) vaccine. One dose is recommended after age 21. Talk to your health care provider about which screenings and vaccines you need and how often you need them. This information is not intended to replace advice given to you by your health care provider. Make sure you discuss any questions you have with your health care provider. Document Released: 02/20/2015 Document Revised: 10/14/2015 Document Reviewed: 11/25/2014 Elsevier Interactive Patient Education  2017 [place] Prevention in the Home Falls can cause injuries. They can happen to people of all ages. There are many things you can do to make your home safe and to help prevent falls. What can I do on the outside of my home? Regularly fix the edges of walkways and driveways and fix any cracks. Remove anything that might make you trip as you walk through a door, such as a raised step or threshold. Trim any bushes or trees on the path to your home. Use bright outdoor lighting. Clear any walking paths of anything that might make someone trip, such as rocks or tools. Regularly check to see if handrails are loose or broken. Make sure that both sides of any steps have handrails. Any raised decks and porches should have guardrails on the edges. Have any leaves, snow, or ice cleared regularly. Use sand or salt on walking paths during winter. Clean up any spills in your garage right away. This includes oil or grease spills. What can I do in the bathroom? Use night lights. Install grab bars by the toilet and in the tub and shower. Do not use towel bars as grab bars. Use non-skid mats or decals in the tub or shower. If you need to sit down in the shower, use a plastic, non-slip stool. Keep the floor dry. Clean up any water that spills on the floor as soon as  it happens. Remove soap buildup in the tub or shower regularly. Attach bath mats securely with double-sided non-slip rug tape. Do not have throw rugs and other things on the floor that can make you trip. What can I do in the bedroom? Use night lights. Make sure that you have a light by your bed that is easy to reach. Do not use any sheets or blankets that are too big for your bed. They should not hang down onto the floor. Have a firm chair that has side arms. You can use this for support while you get dressed. Do not have throw rugs and other things on the floor that can make you trip. What can I do in the kitchen? Clean up any spills right  away. Avoid walking on wet floors. Keep items that you use a lot in easy-to-reach places. If you need to reach something above you, use a strong step stool that has a grab bar. Keep electrical cords out of the way. Do not use floor polish or wax that makes floors slippery. If you must use wax, use non-skid floor wax. Do not have throw rugs and other things on the floor that can make you trip. What can I do with my stairs? Do not leave any items on the stairs. Make sure that there are handrails on both sides of the stairs and use them. Fix handrails that are broken or loose. Make sure that handrails are as long as the stairways. Check any carpeting to make sure that it is firmly attached to the stairs. Fix any carpet that is loose or worn. Avoid having throw rugs at the top or bottom of the stairs. If you do have throw rugs, attach them to the floor with carpet tape. Make sure that you have a light switch at the top of the stairs and the bottom of the stairs. If you do not have them, ask someone to add them for you. What else can I do to help prevent falls? Wear shoes that: Do not have high heels. Have rubber bottoms. Are comfortable and fit you well. Are closed at the toe. Do not wear sandals. If you use a stepladder: Make sure that it is fully opened. Do not climb a closed stepladder. Make sure that both sides of the stepladder are locked into place. Ask someone to hold it for you, if possible. Clearly mark and make sure that you can see: Any grab bars or handrails. First and last steps. Where the edge of each step is. Use tools that help you move around (mobility aids) if they are needed. These include: Canes. Walkers. Scooters. Crutches. Turn on the lights when you go into a dark area. Replace any light bulbs as soon as they burn out. Set up your furniture so you have a clear path. Avoid moving your furniture around. If any of your floors are uneven, fix them. If there are any  pets around you, be aware of where they are. Review your medicines with your doctor. Some medicines can make you feel dizzy. This can increase your chance of falling. Ask your doctor what other things that you can do to help prevent falls. This information is not intended to replace advice given to you by your health care provider. Make sure you discuss any questions you have with your health care provider. Document Released: 11/20/2008 Document Revised: 07/02/2015 Document Reviewed: 02/28/2014 Elsevier Interactive Patient Education  2017 [employer].

## 2022-04-19 NOTE — Telephone Encounter (Signed)
Spouse called stating she was returning a phone call. I advised her I would let Dr. Lanice Shirts MA know.

## 2022-04-19 NOTE — Progress Notes (Signed)
I connected with  Shaun Kennedy on 04/19/22 by a audio enabled telemedicine application and verified that I am speaking with the correct person using two identifiers.  Patient Location: Home  Provider Location: Office/Clinic  I discussed the limitations of evaluation and management by telemedicine. The patient expressed understanding and agreed to proceed.  Subjective:   Shaun Kennedy is a 82 y.o. male who presents for Medicare Annual/Subsequent preventive examination.  Review of Systems     Cardiac Risk Factors include: advanced age (>56mn, >>61women);diabetes mellitus;dyslipidemia;hypertension;male gender     Objective:    Today's Vitals   04/19/22 0918  Weight: 189 lb (85.7 kg)  Height: '5\' 7"'$  (1.702 m)   Body mass index is 29.6 kg/m.     04/19/2022    9:23 AM 03/30/2022   10:32 AM 03/09/2022   10:43 AM 10/13/2021   11:18 AM 09/01/2021   11:17 AM 07/21/2021   10:06 AM 06/30/2021   10:43 AM  Advanced Directives  Does Patient Have a Medical Advance Directive? Yes Yes Yes Yes Yes Yes Yes  Type of [place]   Living will Living will Living will Living will  Does patient want to make changes to medical advance directive?   No - Patient declined    No - Patient declined  Copy of [place] Chart? No - copy requested   No - copy requested No - copy requested No - copy requested No - copy requested    Current Medications (verified) Outpatient Encounter Medications as of 04/19/2022  Medication Sig   Alcohol Swabs (DROPSAFE ALCOHOL PREP) 70 % PADS Apply topically.   amLODipine (NORVASC) 5 MG tablet TAKE 1 TABLET EVERY DAY   aspirin EC 81 MG tablet Take 81 mg by mouth daily after breakfast.    bisacodyl (DULCOLAX) 5 MG EC tablet Take 10 mg by mouth daily as needed for moderate constipation.   Blood Glucose Calibration (TRUE METRIX LEVEL 1) Low SOLN    Blood Glucose Monitoring Suppl (TRUE METRIX AIR GLUCOSE  METER) w/Device KIT USE AS DIRECTED   Borage, Borago officinalis, (BORAGE OIL PO) Take 2,300 mg by mouth.   cholecalciferol (VITAMIN D) 25 MCG (1000 UNIT) tablet Take 1,000 Units by mouth daily.   donepezil (ARICEPT) 5 MG tablet TAKE 1 TABLET AT BEDTIME   ferrous sulfate 325 (65 FE) MG EC tablet TAKE 1 TABLET (325 MG TOTAL) DAILY WITH BREAKFAST.   gabapentin (NEURONTIN) 100 MG capsule Take by mouth.   hydrOXYzine (ATARAX) 10 MG tablet Take 1 tablet (10 mg total) by mouth 3 (three) times daily as needed.   Lancet Devices (TRUEDRAW LANCING DEVICE) MISC Check sugars daily   lisinopril-hydrochlorothiazide (ZESTORETIC) 20-12.5 MG tablet TAKE 1 TABLET EVERY DAY   metFORMIN (GLUCOPHAGE-XR) 500 MG 24 hr tablet TAKE 1 TABLET EVERY DAY   Multiple Vitamin (MULTIVITAMIN WITH MINERALS) TABS tablet Take 1 tablet by mouth daily after breakfast.    simvastatin (ZOCOR) 40 MG tablet TAKE 1 TABLET EVERY DAY   solifenacin (VESICARE) 5 MG tablet TAKE 1 TABLET EVERY DAY   TRUE METRIX BLOOD GLUCOSE TEST test strip TEST BLOOD GLUCOSE  UP TO TWO TIMES DAILY   TRUEplus Lancets 33G MISC 1 each by Does not apply route 2 (two) times daily.   betamethasone valerate ointment (VALISONE) 0.1 % APPLY 1 APPLICATION TOPICALLY 2 TIMES DAILY (Patient not taking: Reported on 04/04/2022)   dronabinol (MARINOL) 2.5 MG capsule Take 1 capsule (2.5  mg total) by mouth 2 (two) times daily before a meal. (Patient not taking: Reported on 04/19/2022)   linaclotide (LINZESS) 72 MCG capsule Take by mouth. (Patient not taking: Reported on 04/19/2022)   No facility-administered encounter medications on file as of 04/19/2022.    Allergies (verified) Patient has no known allergies.   History: Past Medical History:  Diagnosis Date   Allergic rhinitis    Asthma    Carotid stenosis    Colon cancer ([place]) 2003   Diabetes mellitus ([place])    Diverticulosis    Dyslipidemia    ED (erectile dysfunction)    GERD (gastroesophageal reflux disease)     H/O degenerative disc disease    Hemorrhoids    HTN (hypertension)    as a child   Hyperlipidemia    Lung cancer ([place])    LVH (left ventricular hypertrophy)    on EKG   Mass of lower lobe of left lung    Mediastinal adenopathy    Smoker    former   Wears dentures    Wears dentures    Wears glasses    Wears glasses    Past Surgical History:  Procedure Laterality Date   BRONCHIAL BIOPSY  08/20/2019   Procedure: BRONCHIAL BIOPSIES;  Surgeon: Collene Gobble, MD;  Location: [facility] ENDOSCOPY;  Service: Pulmonary;;   BRONCHIAL BRUSHINGS  08/20/2019   Procedure: BRONCHIAL BRUSHINGS;  Surgeon: Collene Gobble, MD;  Location: [facility] ENDOSCOPY;  Service: Pulmonary;;   BRONCHIAL NEEDLE ASPIRATION BIOPSY  08/20/2019   Procedure: BRONCHIAL NEEDLE ASPIRATION BIOPSIES;  Surgeon: Collene Gobble, MD;  Location: [facility] ENDOSCOPY;  Service: Pulmonary;;   CARPAL TUNNEL RELEASE Right 01/09/2019   Procedure: CARPAL TUNNEL RELEASE;  Surgeon: Leandrew Koyanagi, MD;  Location: [place];  Service: Orthopedics;  Laterality: Right;   CARPAL TUNNEL WITH CUBITAL TUNNEL Right 11/18/2020   Procedure: CARPAL TUNNEL WITH CUBITAL TUNNEL;  Surgeon: Charlotte Crumb, MD;  Location: [place];  Service: Orthopedics;  Laterality: Right;   CATARACT EXTRACTION Right 2018   COLONOSCOPY  2007   Dr. Benson [place]   DIRECT LARYNGOSCOPY N/A 04/10/2020   Procedure: DIRECT LARYNGOSCOPY;  Surgeon: Melida Quitter, MD;  Location: [place];  Service: ENT;  Laterality: N/A;   I & D EXTREMITY Right 04/02/2016   Procedure: IRRIGATION AND DEBRIDEMENT GREAT TOE;  Surgeon: Leandrew Koyanagi, MD;  Location: [place];  Service: Orthopedics;  Laterality: Right;   IR IMAGING GUIDED PORT INSERTION  08/30/2019   MULTIPLE TOOTH EXTRACTIONS     RADIOACTIVE SEED IMPLANT  2003   TRACHEOSTOMY TUBE PLACEMENT N/A 04/10/2020   Procedure: TRACHEOSTOMY;  Surgeon: Melida Quitter, MD;  Location: [place];  Service: ENT;  Laterality: N/A;   ULNAR TUNNEL RELEASE Right 01/09/2019    Procedure: RIGHT CUBITAL TUNNEL RELEASE AND CARPAL TUNNEL RELEASE;  Surgeon: Leandrew Koyanagi, MD;  Location: [place];  Service: Orthopedics;  Laterality: Right;   UPPER GASTROINTESTINAL ENDOSCOPY     VIDEO BRONCHOSCOPY N/A 12/18/2019   Procedure: VIDEO BRONCHOSCOPY WITHOUT FLUORO;  Surgeon: Collene Gobble, MD;  Location: [facility] ENDOSCOPY;  Service: Cardiopulmonary;  Laterality: N/A;   VIDEO BRONCHOSCOPY WITH ENDOBRONCHIAL ULTRASOUND N/A 08/20/2019   Procedure: VIDEO BRONCHOSCOPY WITH ENDOBRONCHIAL ULTRASOUND;  Surgeon: Collene Gobble, MD;  Location: [facility] ENDOSCOPY;  Service: Pulmonary;  Laterality: N/A;   Family History  Problem Relation Age of Onset   Heart failure Mother    Diabetes Mother    Stroke Father    Diabetes  Father    Diabetes Sister    Diabetes Sister    Lung cancer Sister 19   Colon cancer Neg Hx    Esophageal cancer Neg Hx    Rectal cancer Neg Hx    Colon polyps Neg Hx    Stomach cancer Neg Hx    Social History   Socioeconomic History   Marital status: Married    Spouse name: Not on file   Number of children: 3   Years of education: Not on file   Highest education level: Not on file  Occupational History   Occupation: retired  Tobacco Use   Smoking status: Former    Packs/day: 1.00    Years: 32.00    Total pack years: 32.00    Types: Cigarettes    Start date: 02/08/1955    Quit date: 10/08/1985    Years since quitting: 36.5   Smokeless tobacco: Never  Vaping Use   Vaping Use: Never used  Substance and Sexual Activity   Alcohol use: No   Drug use: No   Sexual activity: Yes  Other Topics Concern   Not on file  Social History Narrative   Married, no pets   Social Determinants of Health   Financial Resource Strain: Low Risk  (04/19/2022)   Overall Financial Resource Strain (CARDIA)    Difficulty of Paying Living Expenses: Not hard at all  Food Insecurity: No Food Insecurity (04/19/2022)   Hunger Vital Sign    Worried About Running Out of  Food in the Last Year: Never true    [place] in the Last Year: Never true  Transportation Needs: No Transportation Needs (04/19/2022)   PRAPARE - Hydrologist (Medical): No    Lack of Transportation (Non-Medical): No  Physical Activity: Sufficiently Active (04/19/2022)   Exercise Vital Sign    Days of Exercise per Week: 2 days    Minutes of Exercise per Session: 90 min  Stress: No Stress Concern Present (04/19/2022)   [employer]    Feeling of Stress : Not at all  Social Connections: Not on file    Tobacco Counseling Counseling given: Not Answered   Clinical Intake:  Pre-visit preparation completed: Yes  Pain : No/denies pain     Nutritional Status: BMI 25 -29 Overweight Nutritional Risks: None Diabetes: Yes  How often do you need to have someone help you when you read instructions, pamphlets, or other written materials from your doctor or pharmacy?: 1 - Never  Diabetic? Yes Nutrition Risk Assessment:  Has the patient had any N/V/D within the last 2 months?  No  Does the patient have any non-healing wounds?  No  Has the patient had any unintentional weight loss or weight gain?  No   Diabetes:  Is the patient diabetic?  Yes  If diabetic, was a CBG obtained today?  No  Did the patient bring in their glucometer from home?  No  How often do you monitor your CBG's? daily.   Financial Strains and Diabetes Management:  Are you having any financial strains with the device, your supplies or your medication? No .  Does the patient want to be seen by Chronic Care Management for management of their diabetes?  No  Would the patient like to be referred to a Nutritionist or for Diabetic Management?  No   Diabetic Exams:  Diabetic Eye Exam: Overdue for diabetic eye exam. Pt has been advised  about the importance in completing this exam. Patient advised to call and schedule an eye  exam. Diabetic Foot Exam: Overdue, Pt has been advised about the importance in completing this exam. Pt is scheduled for diabetic foot exam on next appointment.   Interpreter Needed?: No  Information entered by :: NAllen LPN   Activities of Daily Living    04/19/2022    9:23 AM  In your present state of health, do you have any difficulty performing the following activities:  Hearing? 0  Vision? 0  Difficulty concentrating or making decisions? 0  Walking or climbing stairs? 0  Dressing or bathing? 0  Doing errands, shopping? 0  Preparing Food and eating ? N  Using the Toilet? N  In the past six months, have you accidently leaked urine? N  Do you have problems with loss of bowel control? N  Managing your Medications? N  Managing your Finances? N  Housekeeping or managing your Housekeeping? N    Patient Care Team: [name], MD as PCP - General (Family Medicine) Curt Bears, MD as Consulting Physician (Oncology) Heath Lark, MD as Consulting Physician (Hematology and Oncology) Melida Quitter, MD as Consulting Physician (Otolaryngology) Rex Kras, Claudette Stapler, RN as [place] any recent [place] you may have received from other than [facility] providers in the past year (date may be approximate).     Assessment:   This is a routine wellness examination for [name].  Hearing/Vision screen Vision Screening - Comments:: Regular eye exams, [facility]  Dietary issues and exercise activities discussed: Current Exercise Habits: Home exercise routine, Type of exercise: Other - see comments (golf), Time (Minutes): > 60, Frequency (Times/Week): 2, Weekly Exercise (Minutes/Week): 0   Goals Addressed             This Visit's Progress    Patient Stated       04/19/2022, no goals       Depression Screen    04/19/2022    9:23 AM 03/31/2021    2:18 PM 02/17/2021    9:39 AM 04/27/2020    1:02 PM 04/27/2020   12:56 PM 10/28/2019    10:31 AM 08/14/2019    8:28 AM  PHQ 2/9 Scores  PHQ - 2 Score 0 0 0 0  0 0  Exception Documentation   Other- indicate reason in comment box Other- indicate reason in comment box Other- indicate reason in comment box    Not completed   call completed with spouse  call completed with spouse      Fall Risk    04/19/2022    9:23 AM 06/17/2021   10:24 AM 04/13/2021    9:28 AM 03/31/2021    2:18 PM 02/17/2021    9:39 AM  [place] in the past year? 0 0 0 0 0  Number falls in past yr: 0 0 0 0 0  Injury with Fall? 0 0 0 0 0  Risk for fall due to : Medication side effect Medication side effect Medication side effect No Fall Risks Medication side effect  Follow up Falls prevention discussed;Education provided;Falls evaluation completed Falls evaluation completed Falls evaluation completed Falls evaluation completed Falls evaluation completed    FALL RISK PREVENTION PERTAINING TO THE HOME:  Any stairs in or around the home? Yes  If so, are there any without handrails? No  Home free of loose throw rugs in walkways, pet beds, electrical cords, etc? Yes  Adequate  lighting in your home to reduce risk of falls? Yes   ASSISTIVE DEVICES UTILIZED TO PREVENT FALLS:  Life alert? No  Use of a cane, walker or w/c? No  Grab bars in the bathroom? No  Shower chair or bench in shower? No  Elevated toilet seat or a handicapped toilet? Yes   TIMED UP AND GO:  Was the test performed? No .      Cognitive Function:        04/19/2022    9:25 AM  6CIT Screen  What Year? 0 points  What month? 0 points  What time? 0 points  Count back from 20 0 points  Months in reverse 2 points  Repeat phrase 10 points  Total Score 12 points    Immunizations Immunization History  Administered Date(s) Administered   COVID-19, mRNA, vaccine(Comirnaty)12 years and older 11/22/2021   Fluad Quad(high Dose 65+) 10/17/2018, 10/28/2019, 11/24/2020   Influenza, High Dose Seasonal PF 12/02/2015, 12/12/2016,  11/02/2017   Influenza,inj,Quad PF,6+ Mos 01/18/2022   PFIZER(Purple Top)SARS-COV-2 Vaccination 04/05/2019, 05/01/2019, 10/28/2019   Pfizer Covid-19 Vaccine Bivalent Booster 77yr & up 11/24/2020   Pneumococcal Conjugate-13 06/25/2014   Pneumococcal Polysaccharide-23 06/03/2010   Tdap 12/06/2007, 10/31/2019   Zoster Recombinat (Shingrix) 02/25/2017, 04/26/2017   Zoster, Live 02/08/2008    TDAP status: Up to date  Flu Vaccine status: Up to date  Pneumococcal vaccine status: Up to date  Covid-19 vaccine status: Completed vaccines  Qualifies for Shingles Vaccine? Yes   Zostavax completed Yes   Shingrix Completed?: Yes  Screening Tests Health Maintenance  Topic Date Due   Diabetic kidney evaluation - Urine ACR  10/17/2019   FOOT EXAM  07/30/2021   HEMOGLOBIN A1C  09/28/2021   COVID-19 Vaccine (6 - 2023-24 season) 01/17/2022   OPHTHALMOLOGY EXAM  02/04/2022   Medicare Annual Wellness (AWV)  03/31/2022   Diabetic kidney evaluation - eGFR measurement  03/31/2023   DTaP/Tdap/Td (3 - Td or Tdap) 10/30/2029   Pneumonia Vaccine 82 Years old  Completed   INFLUENZA VACCINE  Completed   Zoster Vaccines- Shingrix  Completed   HPV VACCINES  Aged Out    Health Maintenance  Health Maintenance Due  Topic Date Due   Diabetic kidney evaluation - Urine ACR  10/17/2019   FOOT EXAM  07/30/2021   HEMOGLOBIN A1C  09/28/2021   COVID-19 Vaccine (6 - 2023-24 season) 01/17/2022   OPHTHALMOLOGY EXAM  02/04/2022   Medicare Annual Wellness (AWV)  03/31/2022    Colorectal cancer screening: No longer required.   Lung Cancer Screening: (Low Dose CT Chest recommended if Age 84-80years, 30 pack-year currently smoking OR have quit w/in 15years.) does not qualify.   Lung Cancer Screening Referral: no  Additional Screening:  Hepatitis C Screening: does not qualify;   Vision Screening: Recommended annual ophthalmology exams for early detection of glaucoma and other disorders of the eye. Is the  patient up to date with their annual eye exam?  Yes  Who is the provider or what is the name of the office in which the patient attends annual eye exams[facility] not [facility] a provider, would they like to be referred to a provider to establish care? No .   Dental Screening: Recommended annual dental exams for proper oral hygiene  Community Resource Referral / Chronic Care Management: CRR required this visit?  No   CCM required this visit?  No      Plan:     I  have personally reviewed and noted the following in the patient's chart:   Medical and social history Use of alcohol, tobacco or illicit drugs  Current medications and supplements including opioid prescriptions. Patient is not currently taking opioid prescriptions. Functional ability and status Nutritional status Physical activity Advanced directives List of other physicians Hospitalizations, surgeries, and ER visits in previous 12 months Vitals Screenings to include cognitive, depression, and falls Referrals and appointments  In addition, I have reviewed and discussed with patient certain preventive protocols, quality metrics, and best practice recommendations. A written personalized care plan for preventive services as well as general preventive health recommendations were provided to patient.     Kellie Simmering, LPN   [id number]   Nurse Notes: none  Due to this being a virtual visit, the after visit summary with patients personalized plan was offered to patient via mail or my-chart.  to pick up at office at next visit

## 2022-04-20 ENCOUNTER — Inpatient Hospital Stay: Payer: Medicare HMO | Admitting: Physician Assistant

## 2022-04-20 ENCOUNTER — Inpatient Hospital Stay: Payer: Medicare HMO

## 2022-04-21 ENCOUNTER — Telehealth: Payer: Self-pay

## 2022-04-21 ENCOUNTER — Other Ambulatory Visit: Payer: Self-pay

## 2022-04-21 ENCOUNTER — Ambulatory Visit (INDEPENDENT_AMBULATORY_CARE_PROVIDER_SITE_OTHER): Payer: Medicare HMO | Admitting: Family Medicine

## 2022-04-21 ENCOUNTER — Encounter: Payer: Self-pay | Admitting: Family Medicine

## 2022-04-21 VITALS — BP 148/68 | HR 57 | Temp 97.9°F | Resp 22 | Ht 67.5 in | Wt 193.2 lb

## 2022-04-21 DIAGNOSIS — E1159 Type 2 diabetes mellitus with other circulatory complications: Secondary | ICD-10-CM | POA: Diagnosis not present

## 2022-04-21 DIAGNOSIS — N3281 Overactive bladder: Secondary | ICD-10-CM

## 2022-04-21 DIAGNOSIS — I700 Atherosclerosis of aorta: Secondary | ICD-10-CM | POA: Diagnosis not present

## 2022-04-21 DIAGNOSIS — C3432 Malignant neoplasm of lower lobe, left bronchus or lung: Secondary | ICD-10-CM

## 2022-04-21 DIAGNOSIS — Z0000 Encounter for general adult medical examination without abnormal findings: Secondary | ICD-10-CM | POA: Diagnosis not present

## 2022-04-21 DIAGNOSIS — I6522 Occlusion and stenosis of left carotid artery: Secondary | ICD-10-CM

## 2022-04-21 DIAGNOSIS — G3184 Mild cognitive impairment, so stated: Secondary | ICD-10-CM

## 2022-04-21 DIAGNOSIS — J398 Other specified diseases of upper respiratory tract: Secondary | ICD-10-CM

## 2022-04-21 DIAGNOSIS — Z930 Tracheostomy status: Secondary | ICD-10-CM

## 2022-04-21 DIAGNOSIS — E1121 Type 2 diabetes mellitus with diabetic nephropathy: Secondary | ICD-10-CM

## 2022-04-21 DIAGNOSIS — J309 Allergic rhinitis, unspecified: Secondary | ICD-10-CM | POA: Diagnosis not present

## 2022-04-21 DIAGNOSIS — E119 Type 2 diabetes mellitus without complications: Secondary | ICD-10-CM

## 2022-04-21 DIAGNOSIS — G620 Drug-induced polyneuropathy: Secondary | ICD-10-CM

## 2022-04-21 DIAGNOSIS — E785 Hyperlipidemia, unspecified: Secondary | ICD-10-CM

## 2022-04-21 DIAGNOSIS — E1169 Type 2 diabetes mellitus with other specified complication: Secondary | ICD-10-CM

## 2022-04-21 DIAGNOSIS — R933 Abnormal findings on diagnostic imaging of other parts of digestive tract: Secondary | ICD-10-CM

## 2022-04-21 DIAGNOSIS — T451X5A Adverse effect of antineoplastic and immunosuppressive drugs, initial encounter: Secondary | ICD-10-CM

## 2022-04-21 DIAGNOSIS — I152 Hypertension secondary to endocrine disorders: Secondary | ICD-10-CM

## 2022-04-21 LAB — POCT GLYCOSYLATED HEMOGLOBIN (HGB A1C): Hemoglobin A1C: 6.6 % — AB (ref 4.0–5.6)

## 2022-04-21 MED ORDER — AMLODIPINE BESYLATE 5 MG PO TABS: 5.0000 mg | ORAL_TABLET | Freq: Every day | ORAL | 3 refills | Status: DC

## 2022-04-21 MED ORDER — DONEPEZIL HCL 5 MG PO TABS: 5.0000 mg | ORAL_TABLET | Freq: Every day | ORAL | 3 refills | Status: DC

## 2022-04-21 MED ORDER — LISINOPRIL-HYDROCHLOROTHIAZIDE 20-12.5 MG PO TABS: 1.0000 | ORAL_TABLET | Freq: Every day | ORAL | 3 refills | Status: DC

## 2022-04-21 MED ORDER — METFORMIN HCL ER 500 MG PO TB24: 500.0000 mg | ORAL_TABLET | Freq: Every day | ORAL | 1 refills | Status: DC

## 2022-04-21 NOTE — Telephone Encounter (Signed)
-----   Message from Willia Craze, NP sent at 04/13/2022  4:45 PM EST ----- Shaun Kennedy,  Will you please contact patient and tell him the nodular area of distal small bowel is not seen on CT enterography but there is an abnormality of the upper portion of his small intestine  Dr. Rush Landmark recommends an enteroscopy it patient is agreeable. He has had some upper airway problems and has a trach. Anesthesia has cleared him for the [place].   Thank you

## 2022-04-21 NOTE — Telephone Encounter (Signed)
Spoke with the patient. He is in agreement with this plan. Date set for 05/10/22. Instructions will be mailed to the patient.

## 2022-04-21 NOTE — Progress Notes (Signed)
Complete physical exam  Patient: Shaun Kennedy   DOB: 14-Sep-1940   82 y.o. Male  MRN: [id number]  Subjective:    Chief Complaint  Patient presents with   Annual Exam    Had AWV with [facility] on 04/19/2022. Fasting. No additional concerns.     Shaun Kennedy is a 82 y.o. male who presents today for a complete physical exam. He reports consuming a general diet. Home exercise routine includes walking and playing golf.. He generally feels fairly well. He reports sleeping fairly well. He does not have additional problems to discuss today.  He was seen recently by Dr. 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  He saw Dr. Julien Nordmann February 21.  He apparently will be scheduled for follow-up CT scan in several months.  Overall he seem to be doing quite well.  He does have a Port-A-Cath in place.  He did not bring his medications in but states he is taking gabapentin but only taking it once per day.  He apparently continues on simvastatin as well as Aricept and lisinopril/HCTZ with amlodipine.  His blood pressure is recorded.  He continues on metformin and is having no difficulty with that.  He does check his blood sugars but states his machine is not working properly.  Did recommend that he get a new one.  He continues on Vesicare.  Otherwise his past medical history, allergies and health maintenance was reviewed.   Most recent fall risk assessment:    04/19/2022    9:23 AM  Eleva in the past year? 0  Number falls in past yr: 0  Injury with Fall? 0  Risk for fall due to : Medication side effect  Follow up Falls prevention discussed;Education provided;Falls evaluation completed     Most recent depression screenings:    04/19/2022    9:23 AM 03/31/2021    2:18 PM  PHQ 2/9 Scores  PHQ - 2 Score 0 0    Vision:Within last year and Dental: No regular dental care     Patient Care Team: [name], MD as PCP - General (Family Medicine) Curt Bears, MD as Consulting  Physician (Oncology) Heath Lark, MD as Consulting Physician (Hematology and Oncology) Melida Quitter, MD as Consulting Physician (Otolaryngology) Rex Kras, Claudette Stapler, RN as [facility]   Outpatient Medications Prior to Visit  Medication Sig   Alcohol Swabs (DROPSAFE ALCOHOL PREP) 70 % PADS Apply topically.   aspirin EC 81 MG tablet Take 81 mg by mouth daily after breakfast.    bisacodyl (DULCOLAX) 5 MG EC tablet Take 10 mg by mouth daily as needed for moderate constipation.   Blood Glucose Calibration (TRUE METRIX LEVEL 1) Low SOLN    Blood Glucose Monitoring Suppl (TRUE METRIX AIR GLUCOSE METER) w/Device KIT USE AS DIRECTED   Borage, Borago officinalis, (BORAGE OIL PO) Take 2,300 mg by mouth.   cholecalciferol (VITAMIN D) 25 MCG (1000 UNIT) tablet Take 1,000 Units by mouth daily.   dronabinol (MARINOL) 2.5 MG capsule Take 1 capsule (2.5 mg total) by mouth 2 (two) times daily before a meal.   ferrous sulfate 325 (65 FE) MG EC tablet TAKE 1 TABLET (325 MG TOTAL) DAILY WITH BREAKFAST.   gabapentin (NEURONTIN) 100 MG capsule Take by mouth.   hydrOXYzine (ATARAX) 10 MG tablet Take 1 tablet (10 mg total) by mouth 3 (three) times daily as needed.   Lancet Devices (TRUEDRAW LANCING DEVICE) MISC Check sugars daily  linaclotide (LINZESS) 72 MCG capsule Take by mouth.   Multiple Vitamin (MULTIVITAMIN WITH MINERALS) TABS tablet Take 1 tablet by mouth daily after breakfast.    simvastatin (ZOCOR) 40 MG tablet TAKE 1 TABLET EVERY DAY   solifenacin (VESICARE) 5 MG tablet TAKE 1 TABLET EVERY DAY   TRUE METRIX BLOOD GLUCOSE TEST test strip TEST BLOOD GLUCOSE  UP TO TWO TIMES DAILY   TRUEplus Lancets 33G MISC 1 each by Does not apply route 2 (two) times daily.   [DISCONTINUED] amLODipine (NORVASC) 5 MG tablet TAKE 1 TABLET EVERY DAY   [DISCONTINUED] donepezil (ARICEPT) 5 MG tablet TAKE 1 TABLET AT BEDTIME   [DISCONTINUED] lisinopril-hydrochlorothiazide (ZESTORETIC) 20-12.5 MG  tablet TAKE 1 TABLET EVERY DAY   [DISCONTINUED] metFORMIN (GLUCOPHAGE-XR) 500 MG 24 hr tablet TAKE 1 TABLET EVERY DAY   betamethasone valerate ointment (VALISONE) 0.1 % APPLY 1 APPLICATION TOPICALLY 2 TIMES DAILY (Patient not taking: Reported on 04/04/2022)   No facility-administered medications prior to visit.    Review of Systems  All other systems reviewed and are negative.         Objective:        Physical Exam  Alert and in no distress. Tympanic membranes and canals are normal. Pharyngeal area is normal. Neck is supple without adenopathy or thyromegaly. Cardiac exam shows a regular sinus rhythm without murmurs or gallops. Lungs show harsh breath sounds in all lung fields. Hemoglobin A1c is 6.6.     Assessment & Plan:   Routine general medical examination at a health care facility  Hypertension associated with diabetes ([place]) - Plan: lisinopril-hydrochlorothiazide (ZESTORETIC) 20-12.5 MG tablet, amLODipine (NORVASC) 5 MG tablet  Stenosis of left carotid artery  Trachea, stenosis  Malignant neoplasm of lower lobe of left lung (HCC)  Atherosclerosis of aorta (HCC)  Allergic rhinitis, mild  Hyperlipidemia associated with type 2 diabetes mellitus ([place]) - Plan: Lipid panel  Diabetic nephropathy associated with type 2 diabetes mellitus ([place])  Diabetes mellitus type II, non insulin dependent ([place]) - Plan: POCT glycosylated hemoglobin (Hb A1C)  Chemotherapy-induced neuropathy (HCC)  OAB (overactive bladder)  Tracheostomy status (HCC)  Mild cognitive impairment - Plan: donepezil (ARICEPT) 5 MG tablet  Type 2 diabetes mellitus without complication, without long-term current use of insulin ([place]) - Plan: metFORMIN (GLUCOPHAGE-XR) 500 MG 24 hr tablet   Immunization History  Administered Date(s) Administered   COVID-19, mRNA, vaccine(Comirnaty)12 years and older 11/22/2021   Fluad Quad(high Dose 65+) 10/17/2018, 10/28/2019, 11/24/2020   Influenza, High Dose Seasonal  PF 12/02/2015, 12/12/2016, 11/02/2017   Influenza,inj,Quad PF,6+ Mos 01/18/2022   PFIZER(Purple Top)SARS-COV-2 Vaccination 04/05/2019, 05/01/2019, 10/28/2019   Pfizer Covid-19 Vaccine Bivalent Booster 6yr & up 11/24/2020   Pneumococcal Conjugate-13 06/25/2014   Pneumococcal Polysaccharide-23 06/03/2010   Tdap 12/06/2007, 10/31/2019   Zoster Recombinat (Shingrix) 02/25/2017, 04/26/2017   Zoster, Live 02/08/2008    Health Maintenance  Topic Date Due   Diabetic kidney evaluation - Urine ACR  10/17/2019   FOOT EXAM  07/30/2021   COVID-19 Vaccine (6 - 2023-24 season) 01/17/2022   OPHTHALMOLOGY EXAM  02/04/2022   HEMOGLOBIN A1C  10/22/2022   Diabetic kidney evaluation - eGFR measurement  03/31/2023   Medicare Annual Wellness (AWV)  04/19/2023   DTaP/Tdap/Td (3 - Td or Tdap) 10/30/2029   Pneumonia Vaccine 82 Years old  Completed   INFLUENZA VACCINE  Completed   Zoster Vaccines- Shingrix  Completed   HPV VACCINES  Aged Out    Discussed the need for him to bring all of  his medications and when the comes here to ensure that we are getting accurate account of the medicines that he is taking.  There is a question of exactly the dosing on some of his medications and whether he is even taking them correctly. Problem List Items Addressed This Visit     Allergic rhinitis, mild   Atherosclerosis of aorta (HCC)   Relevant Medications   lisinopril-hydrochlorothiazide (ZESTORETIC) 20-12.5 MG tablet   amLODipine (NORVASC) 5 MG tablet   Carotid stenosis   Relevant Medications   lisinopril-hydrochlorothiazide (ZESTORETIC) 20-12.5 MG tablet   amLODipine (NORVASC) 5 MG tablet   Chemotherapy-induced neuropathy (HCC)   Relevant Medications   donepezil (ARICEPT) 5 MG tablet   Diabetes mellitus type II, non insulin dependent (HCC)   Relevant Medications   metFORMIN (GLUCOPHAGE-XR) 500 MG 24 hr tablet   lisinopril-hydrochlorothiazide (ZESTORETIC) 20-12.5 MG tablet   Other Relevant Orders   POCT  glycosylated hemoglobin (Hb A1C) (Completed)   Diabetic nephropathy associated with type 2 diabetes mellitus (HCC)   Relevant Medications   metFORMIN (GLUCOPHAGE-XR) 500 MG 24 hr tablet   lisinopril-hydrochlorothiazide (ZESTORETIC) 20-12.5 MG tablet   Hyperlipidemia associated with type 2 diabetes mellitus (HCC)   Relevant Medications   metFORMIN (GLUCOPHAGE-XR) 500 MG 24 hr tablet   lisinopril-hydrochlorothiazide (ZESTORETIC) 20-12.5 MG tablet   amLODipine (NORVASC) 5 MG tablet   Other Relevant Orders   Lipid panel   Hypertension associated with diabetes (HCC)   Relevant Medications   metFORMIN (GLUCOPHAGE-XR) 500 MG 24 hr tablet   lisinopril-hydrochlorothiazide (ZESTORETIC) 20-12.5 MG tablet   amLODipine (NORVASC) 5 MG tablet   Malignant neoplasm of lower lobe of left lung (HCC)   Mild cognitive impairment   Relevant Medications   donepezil (ARICEPT) 5 MG tablet   OAB (overactive bladder)   Trachea, stenosis   Tracheostomy status ([place])   Other Visit Diagnoses     Routine general medical examination at a health care facility    -  Primary   Type 2 diabetes mellitus without complication, without long-term current use of insulin (HCC)       Relevant Medications   metFORMIN (GLUCOPHAGE-XR) 500 MG 24 hr tablet   lisinopril-hydrochlorothiazide (ZESTORETIC) 20-12.5 MG tablet      Return in about 6 months (around 10/22/2022).     Jill Alexanders, MD

## 2022-04-22 LAB — LIPID PANEL
Chol/HDL Ratio: 1.9 ratio (ref 0.0–5.0)
Cholesterol, Total: 153 mg/dL (ref 100–199)
HDL: 79 mg/dL (ref >39)
LDL Chol Calc (NIH): 62 mg/dL (ref 0–99)
Triglycerides: 57 mg/dL (ref 0–149)
VLDL Cholesterol Cal: 12 mg/dL (ref 5–40)

## 2022-04-26 ENCOUNTER — Telehealth: Payer: Self-pay | Admitting: Internal Medicine

## 2022-04-26 NOTE — Telephone Encounter (Signed)
Called patient regarding upcoming appointments, patient is notified. 

## 2022-04-28 ENCOUNTER — Ambulatory Visit (HOSPITAL_COMMUNITY)
Admission: EM | Admit: 2022-04-28 | Discharge: 2022-04-28 | Disposition: A | Discharge status: Home / Self Care | Payer: Medicare HMO | Attending: Internal Medicine | Admitting: Internal Medicine

## 2022-04-28 ENCOUNTER — Encounter (HOSPITAL_COMMUNITY): Payer: Self-pay

## 2022-04-28 DIAGNOSIS — L84 Corns and callosities: Secondary | ICD-10-CM

## 2022-04-28 NOTE — Discharge Instructions (Addendum)
Please get some in shoe inserts to pad your feet.  This will reduce how much pain/discomfort you get when walking. Tylenol as needed for pain and/or fever Please call the podiatry office and make an appointment to be seen Return to urgent care if you have other concerns.

## 2022-04-28 NOTE — ED Triage Notes (Signed)
Pt is here for right foot pain for several days. Pt denies any injuries or falls to his foot.

## 2022-04-29 NOTE — ED Provider Notes (Signed)
[facility]    CSN: [id number] Arrival date & time: 04/28/22  1552      History   Chief Complaint Chief Complaint  Patient presents with   Foot Pain    HPI Shaun Kennedy is a 82 y.o. male with a history of diabetes mellitus type 2 comes to urgent care with right foot pain of a few days duration.  Patient denies any trauma to the right foot.  He denies any numbness or tingling of the right foot.  No fever or chills.  No discharge from the right foot.  Pain is sharp in nature and aggravated by palpation or putting pressure on the right foot.   HPI  Past Medical History:  Diagnosis Date   Allergic rhinitis    Asthma    Carotid stenosis    Colon cancer ([place]) 2003   Diabetes mellitus ([place])    Diverticulosis    Dyslipidemia    ED (erectile dysfunction)    GERD (gastroesophageal reflux disease)    H/O degenerative disc disease    Hemorrhoids    HTN (hypertension)    as a child   Hyperlipidemia    Lung cancer ([place])    LVH (left ventricular hypertrophy)    on EKG   Mass of lower lobe of left lung    Mediastinal adenopathy    Smoker    former   Wears dentures    Wears dentures    Wears glasses    Wears glasses     Patient Active Problem List   Diagnosis Date Noted   Cough with hemoptysis 01/24/2022   Abnormal CT scan, small bowel 11/24/2021   Mild cognitive impairment 02/04/2021   First degree heart block 02/04/2021   Right inguinal hernia 01/14/2021   Trachea, stenosis    Tracheostomy status ([place])    Pleuritic chest pain 03/17/2020   Chemotherapy-induced neuropathy ([place]) 02/12/2020   Vocal cord dysfunction 02/12/2020   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   Stridor 12/18/2019   Abnormal findings on diagnostic imaging of lung 11/06/2019   Antineoplastic chemotherapy induced anemia 10/16/2019   Shortness of breath 10/15/2019   Odynophagia 09/25/2019   Port-A-Cath in place 09/17/2019   Decreased appetite 09/03/2019   Malignant neoplasm of lower lobe of  left lung ([place]) 08/28/2019   Encounter for antineoplastic chemotherapy 08/24/2019   Encounter for antineoplastic immunotherapy 08/24/2019   Goals of care, counseling/discussion 08/24/2019   Atherosclerosis of aorta ([place]) 04/09/2019   Carpal tunnel syndrome, left upper limb 02/19/2019   History of carpal tunnel surgery of right wrist 12/21/2018   OAB (overactive bladder) 10/11/2017   Diabetic nephropathy associated with type 2 diabetes mellitus ([place]) 10/11/2017   Onychomycosis of multiple toenails with type 2 diabetes mellitus ([place]) 07/22/2015   Former smoker, stopped smoking in distant past 07/22/2015   Diabetes mellitus type II, non insulin dependent ([place]) 06/13/2011   ED (erectile dysfunction) 06/13/2011   History of prostate cancer 06/13/2011   Hypertension associated with diabetes ([place]) 06/13/2011   Hyperlipidemia associated with type 2 diabetes mellitus ([place]) 06/13/2011   Allergic rhinitis, mild 06/13/2011   History of asthma 06/13/2011   Carotid stenosis 06/13/2011    Past Surgical History:  Procedure Laterality Date   BRONCHIAL BIOPSY  08/20/2019   Procedure: BRONCHIAL BIOPSIES;  Surgeon: Collene Gobble, MD;  Location: [place] [facility];  Service: Pulmonary;;   BRONCHIAL BRUSHINGS  08/20/2019   Procedure: BRONCHIAL BRUSHINGS;  Surgeon: Collene Gobble, MD;  Location: [facility] ENDOSCOPY;  Service: Pulmonary;;  BRONCHIAL NEEDLE ASPIRATION BIOPSY  08/20/2019   Procedure: BRONCHIAL NEEDLE ASPIRATION BIOPSIES;  Surgeon: Collene Gobble, MD;  Location: [facility] ENDOSCOPY;  Service: Pulmonary;;   CARPAL TUNNEL RELEASE Right 01/09/2019   Procedure: CARPAL TUNNEL RELEASE;  Surgeon: Leandrew Koyanagi, MD;  Location: [place];  Service: Orthopedics;  Laterality: Right;   CARPAL TUNNEL WITH CUBITAL TUNNEL Right 11/18/2020   Procedure: CARPAL TUNNEL WITH CUBITAL TUNNEL;  Surgeon: Charlotte Crumb, MD;  Location: [place];  Service: Orthopedics;  Laterality: Right;   CATARACT EXTRACTION Right 2018    COLONOSCOPY  2007   Dr. Benson [place]   DIRECT LARYNGOSCOPY N/A 04/10/2020   Procedure: DIRECT LARYNGOSCOPY;  Surgeon: Melida Quitter, MD;  Location: [place];  Service: ENT;  Laterality: N/A;   I & D EXTREMITY Right 04/02/2016   Procedure: IRRIGATION AND DEBRIDEMENT GREAT TOE;  Surgeon: Leandrew Koyanagi, MD;  Location: [place];  Service: Orthopedics;  Laterality: Right;   IR IMAGING GUIDED PORT INSERTION  08/30/2019   MULTIPLE TOOTH EXTRACTIONS     RADIOACTIVE SEED IMPLANT  2003   TRACHEOSTOMY TUBE PLACEMENT N/A 04/10/2020   Procedure: TRACHEOSTOMY;  Surgeon: Melida Quitter, MD;  Location: [place] [facility];  Service: ENT;  Laterality: N/A;   ULNAR TUNNEL RELEASE Right 01/09/2019   Procedure: RIGHT CUBITAL TUNNEL RELEASE AND CARPAL TUNNEL RELEASE;  Surgeon: Leandrew Koyanagi, MD;  Location: [place];  Service: Orthopedics;  Laterality: Right;   UPPER GASTROINTESTINAL ENDOSCOPY     VIDEO BRONCHOSCOPY N/A 12/18/2019   Procedure: VIDEO BRONCHOSCOPY WITHOUT FLUORO;  Surgeon: Collene Gobble, MD;  Location: [facility] ENDOSCOPY;  Service: Cardiopulmonary;  Laterality: N/A;   VIDEO BRONCHOSCOPY WITH ENDOBRONCHIAL ULTRASOUND N/A 08/20/2019   Procedure: VIDEO BRONCHOSCOPY WITH ENDOBRONCHIAL ULTRASOUND;  Surgeon: Collene Gobble, MD;  Location: [facility] ENDOSCOPY;  Service: Pulmonary;  Laterality: N/A;       Home Medications    Prior to Admission medications   Medication Sig Start Date End Date Taking? Authorizing Provider  amLODipine (NORVASC) 5 MG tablet Take 1 tablet (5 mg total) by mouth daily. 04/21/22  Yes [name], MD  aspirin EC 81 MG tablet Take 81 mg by mouth daily after breakfast.    Yes [provider]  Blood Glucose Calibration (TRUE METRIX LEVEL 1) Low SOLN  08/26/20  Yes [provider]  Blood Glucose Monitoring Suppl (TRUE METRIX AIR GLUCOSE METER) w/Device KIT USE AS DIRECTED 11/09/21  Yes [name], MD  cholecalciferol (VITAMIN D) 25 MCG (1000 UNIT) tablet Take 1,000 Units by mouth  daily.   Yes [provider]  donepezil (ARICEPT) 5 MG tablet Take 1 tablet (5 mg total) by mouth at bedtime. 04/21/22  Yes [name], MD  Lancet Devices (TRUEDRAW LANCING DEVICE) MISC Check sugars daily 08/31/20  Yes [name], MD  lisinopril-hydrochlorothiazide (ZESTORETIC) 20-12.5 MG tablet Take 1 tablet by mouth daily. 04/21/22  Yes [name], MD  metFORMIN (GLUCOPHAGE-XR) 500 MG 24 hr tablet Take 1 tablet (500 mg total) by mouth daily. 04/21/22  Yes [name], MD  Multiple Vitamin (MULTIVITAMIN WITH MINERALS) TABS tablet Take 1 tablet by mouth daily after breakfast.    Yes [provider]  simvastatin (ZOCOR) 40 MG tablet TAKE 1 TABLET EVERY DAY 03/07/22  Yes [name], MD  solifenacin (VESICARE) 5 MG tablet TAKE 1 TABLET EVERY DAY 08/17/20  Yes [name], MD  TRUE METRIX BLOOD GLUCOSE TEST test strip TEST BLOOD GLUCOSE  UP TO TWO TIMES DAILY 11/09/21  Yes [name], MD  TRUEplus Lancets 33G MISC 1 each by Does not apply route 2 (two) times daily. 11/03/21  Yes [name], MD  Alcohol Swabs (DROPSAFE ALCOHOL PREP) 70 % PADS Apply topically. 08/26/20   [provider]  betamethasone valerate ointment (VALISONE) 0.1 % APPLY 1 APPLICATION TOPICALLY 2 TIMES DAILY Patient not taking: Reported on 04/04/2022 10/01/21   [name], MD  bisacodyl (DULCOLAX) 5 MG EC tablet Take 10 mg by mouth daily as needed for moderate constipation.    [provider]  Borage, Borago officinalis, (BORAGE OIL PO) Take 2,300 mg by mouth.    [provider]  dronabinol (MARINOL) 2.5 MG capsule Take 1 capsule (2.5 mg total) by mouth 2 (two) times daily before a meal. 03/18/21   [name], MD  ferrous sulfate 325 (65 FE) MG EC tablet TAKE 1 TABLET (325 MG TOTAL) DAILY WITH BREAKFAST. 11/10/21   [name], MD  gabapentin (NEURONTIN) 100 MG capsule Take by mouth. 12/03/19   [provider]  hydrOXYzine (ATARAX) 10 MG  tablet Take 1 tablet (10 mg total) by mouth 3 (three) times daily as needed. 10/13/21   Curt Bears, MD  linaclotide Rolan Lipa) 72 MCG capsule Take by mouth. 11/18/19   [provider]    Family History Family History  Problem Relation Age of Onset   Heart failure Mother    Diabetes Mother    Stroke Father    Diabetes Father    Diabetes Sister    Diabetes Sister    Lung cancer Sister 64   Colon cancer Neg Hx    Esophageal cancer Neg Hx    Rectal cancer Neg Hx    Colon polyps Neg Hx    Stomach cancer Neg Hx     Social History Social History   Tobacco Use   Smoking status: Former    Packs/day: 1.00    Years: 32.00    Additional pack years: 0.00    Total pack years: 32.00    Types: Cigarettes    Start date: 02/08/1955    Quit date: 10/08/1985    Years since quitting: 36.5   Smokeless tobacco: Never  Vaping Use   Vaping Use: Never used  Substance Use Topics   Alcohol use: No   Drug use: No     Allergies   Patient has no known allergies.   Review of Systems Review of Systems As per HPI  Physical Exam Triage Vital Signs ED Triage Vitals [04/28/22 1705]  Enc Vitals Group     BP 118/70     Pulse Rate 70     Resp 18     Temp (!) 97.5 F (36.4 C)     Temp Source Oral     SpO2 98 %     Weight      Height      Head Circumference      Peak Flow      Pain Score      Pain Loc      Pain Edu?      Excl. in [place]?    No data found.  Updated Vital Signs BP 118/70 (BP Location: Left Arm)   Pulse 70   Temp (!) 97.5 F (36.4 C) (Oral)   Resp 18   SpO2 98%   Visual Acuity Right Eye Distance:   Left Eye Distance:   Bilateral Distance:    Right  Eye Near:   Left Eye Near:    Bilateral Near:     Physical Exam Vitals and nursing note reviewed.  Constitutional:      Appearance: He is not ill-appearing or diaphoretic.  Cardiovascular:     Rate and Rhythm: Normal rate and regular rhythm.  Musculoskeletal:        General: Normal range of motion.      Comments: Callus on the base of the fifth toe of the right foot  Neurological:     Mental Status: He is alert.      UC Treatments / Results  Labs (all labs ordered are listed, but only abnormal results are displayed) Labs Reviewed - No data to display  EKG   Radiology No results found.  Procedures Procedures (including critical care time)  Medications Ordered in UC Medications - No data to display  Initial Impression / Assessment and Plan / UC Course  I have reviewed the triage vital signs and the nursing notes.  Pertinent labs & imaging results that were available during my care of the patient were reviewed by me and considered in my medical decision making (see chart for details).     1.  Right foot callus: Patient is advised to follow-up with podiatry for further management Patient agrees with plan Information about podiatry office given to the patient. Final Clinical Impressions(s) / UC Diagnoses   Final diagnoses:  Foot callus     Discharge Instructions      Please get some in shoe inserts to pad your feet.  This will reduce how much pain/discomfort you get when walking. Tylenol as needed for pain and/or fever Please call the podiatry office and make an appointment to be seen Return to urgent care if you have other concerns.    ED Prescriptions   None    PDMP not reviewed this encounter.   Chase Picket, MD 04/29/22 [phone]

## 2022-05-10 ENCOUNTER — Other Ambulatory Visit: Payer: Medicare HMO | Admitting: Gastroenterology

## 2022-05-11 ENCOUNTER — Ambulatory Visit: Payer: Medicare HMO

## 2022-05-11 ENCOUNTER — Other Ambulatory Visit: Payer: Medicare HMO

## 2022-05-11 ENCOUNTER — Ambulatory Visit: Payer: Medicare HMO | Admitting: Internal Medicine

## 2022-05-11 ENCOUNTER — Telehealth: Payer: Self-pay | Admitting: Internal Medicine

## 2022-05-11 ENCOUNTER — Telehealth: Payer: Self-pay | Admitting: Gastroenterology

## 2022-05-11 ENCOUNTER — Telehealth: Payer: Self-pay | Admitting: Nurse Practitioner

## 2022-05-11 NOTE — Telephone Encounter (Signed)
Patient's wife called because she thought yesterday's appointment was for a CT scan and not a enteroscopy. She is confused why is was not for CT scan and would like to go over why it was not, and what a enteroscopy is. [phone] is the best phone number to call her back on.

## 2022-05-11 NOTE — Telephone Encounter (Signed)
Beth, Thanks for reaching out. Not sure why she is confused, as you had spoken with them back in March about the reasoning why we were performing an enteroscopy because we had just done a CT enterography. In any case, the patient can be rescheduled for enteroscopy as able, with the hope that we can reach that area that was thickened (though even pushing with our enteroscope may not be able to reach there and at some point we may need to consider a video capsule).  Thanks. GM

## 2022-05-11 NOTE — Telephone Encounter (Signed)
Spoke with Shaun Kennedy. The patient thought he was having a CT yesterday. The instructions for the enteroscopy did not arrive. (Mailed on 04/26/22.) Patient is scheduled for CT A/P 05/17/22 ordered by Oncology. Do you want the enteroscopy rescheduled?

## 2022-05-11 NOTE — Telephone Encounter (Signed)
Error

## 2022-05-11 NOTE — Telephone Encounter (Signed)
Patient's wife called because she thought yesterday's appointment was for a CT scan and not a enteroscopy. She is confused why is was not for CT scan and would like to go over why it was not, and what a enteroscopy is. [phone] is the best phone number to call her back on.

## 2022-05-16 ENCOUNTER — Emergency Department (HOSPITAL_COMMUNITY): Payer: Medicare HMO

## 2022-05-16 ENCOUNTER — Emergency Department (HOSPITAL_COMMUNITY)
Admission: EM | Admit: 2022-05-16 | Discharge: 2022-05-16 | Disposition: A | Discharge status: Home / Self Care | Payer: Medicare HMO | Attending: Emergency Medicine | Admitting: Emergency Medicine

## 2022-05-16 DIAGNOSIS — M79671 Pain in right foot: Secondary | ICD-10-CM | POA: Diagnosis not present

## 2022-05-16 DIAGNOSIS — Z7982 Long term (current) use of aspirin: Secondary | ICD-10-CM | POA: Insufficient documentation

## 2022-05-16 MED ORDER — ACETAMINOPHEN 500 MG PO TABS
1000.0000 mg | ORAL_TABLET | Freq: Once | ORAL | Status: AC
  Administered 2022-05-16: 1000 mg via ORAL
  Filled 2022-05-16: qty 2

## 2022-05-16 NOTE — Discharge Instructions (Addendum)
Follow-up with podiatry as previously directed. Call [phone] Dr Lilian Kapur Your x-ray did not see any foreign body. Use Tylenol every 4 as needed for pain. You can soak foot twice a day to help and if small foreign body is in there it will help it come out.

## 2022-05-16 NOTE — ED Provider Notes (Signed)
[place] [facility]ERGENCY DEPARTMENT AT [facility] [place] Provider Note   CSN: [id number] Arrival date & time: 05/16/22  1836     History  Chief Complaint  Patient presents with   Foot Pain    Mariah MillingHoward L Iribe is a 82 y.o. male.  Patient presents with right foot pain for the past 2 days.  Patient seen urgent care diagnosed with callus and referred to podiatry.  Patient never made appointment yet.  No fevers or chills.  No injuries recalled.  Patient thinks might be foreign body.       Home Medications Prior to Admission medications   Medication Sig Start Date End Date Taking? Authorizing Provider  Alcohol Swabs (DROPSAFE ALCOHOL PREP) 70 % PADS Apply topically. 08/26/20   [provider]  amLODipine (NORVASC) 5 MG tablet Take 1 tablet (5 mg total) by mouth daily. 04/21/22   Ronnald NianLalonde, John C, MD  aspirin EC 81 MG tablet Take 81 mg by mouth daily after breakfast.     [provider]  betamethasone valerate ointment (VALISONE) 0.1 % APPLY 1 APPLICATION TOPICALLY 2 TIMES DAILY Patient not taking: Reported on 04/04/2022 10/01/21   Ronnald NianLalonde, John C, MD  bisacodyl (DULCOLAX) 5 MG EC tablet Take 10 mg by mouth daily as needed for moderate constipation.    [provider]  Blood Glucose Calibration (TRUE METRIX LEVEL 1) Low SOLN  08/26/20   [provider]  Blood Glucose Monitoring Suppl (TRUE METRIX AIR GLUCOSE METER) w/Device KIT USE AS DIRECTED 11/09/21   Ronnald NianLalonde, John C, MD  Borage, Borago officinalis, (BORAGE OIL PO) Take 2,300 mg by mouth.    [provider]  cholecalciferol (VITAMIN D) 25 MCG (1000 UNIT) tablet Take 1,000 Units by mouth daily.    [provider]  donepezil (ARICEPT) 5 MG tablet Take 1 tablet (5 mg total) by mouth at bedtime. 04/21/22   Ronnald NianLalonde, John C, MD  dronabinol (MARINOL) 2.5 MG capsule Take 1 capsule (2.5 mg total) by mouth 2 (two) times daily before a meal. 03/18/21   Ronnald NianLalonde, John C, MD  ferrous sulfate 325 (65 FE) MG  EC tablet TAKE 1 TABLET (325 MG TOTAL) DAILY WITH BREAKFAST. 11/10/21   Ronnald NianLalonde, John C, MD  gabapentin (NEURONTIN) 100 MG capsule Take by mouth. 12/03/19   [provider]  hydrOXYzine (ATARAX) 10 MG tablet Take 1 tablet (10 mg total) by mouth 3 (three) times daily as needed. 10/13/21   Si GaulMohamed, Mohamed, MD  Lancet Devices (TRUEDRAW LANCING DEVICE) MISC Check sugars daily 08/31/20   Ronnald NianLalonde, John C, MD  linaclotide Karlene Einstein(LINZESS) 72 MCG capsule Take by mouth. 11/18/19   [provider]  lisinopril-hydrochlorothiazide (ZESTORETIC) 20-12.5 MG tablet Take 1 tablet by mouth daily. 04/21/22   Ronnald NianLalonde, John C, MD  metFORMIN (GLUCOPHAGE-XR) 500 MG 24 hr tablet Take 1 tablet (500 mg total) by mouth daily. 04/21/22   Ronnald NianLalonde, John C, MD  Multiple Vitamin (MULTIVITAMIN WITH MINERALS) TABS tablet Take 1 tablet by mouth daily after breakfast.     [provider]  simvastatin (ZOCOR) 40 MG tablet TAKE 1 TABLET EVERY DAY 03/07/22   Ronnald NianLalonde, John C, MD  solifenacin (VESICARE) 5 MG tablet TAKE 1 TABLET EVERY DAY 08/17/20   Ronnald NianLalonde, John C, MD  TRUE METRIX BLOOD GLUCOSE TEST test strip TEST BLOOD GLUCOSE  UP TO TWO TIMES DAILY 11/09/21   Ronnald NianLalonde, John C, MD  TRUEplus Lancets 33G MISC 1 each by Does not apply route 2 (two) times daily. 11/03/21  Ronnald Nian, MD      Allergies    Patient has no known allergies.    Review of Systems   Review of Systems  Constitutional:  Negative for chills and fever.  HENT:  Negative for congestion.   Respiratory:  Negative for shortness of breath.   Cardiovascular:  Negative for chest pain.  Gastrointestinal:  Negative for abdominal pain and vomiting.  Genitourinary:  Negative for dysuria and flank pain.  Musculoskeletal:  Negative for back pain, neck pain and neck stiffness.  Skin:  Negative for rash.  Neurological:  Negative for light-headedness and headaches.    Physical Exam Updated Vital Signs BP 124/75 (BP Location: Right Arm)   Pulse 94   Temp  98.4 F (36.9 C)   Resp (!) 22   SpO2 97%  Physical Exam Vitals and nursing note reviewed.  Constitutional:      General: He is not in acute distress.    Appearance: He is well-developed.  HENT:     Head: Normocephalic and atraumatic.     Mouth/Throat:     Mouth: Mucous membranes are moist.  Eyes:     General:        Right eye: No discharge.        Left eye: No discharge.     Conjunctiva/sclera: Conjunctivae normal.  Neck:     Trachea: No tracheal deviation.  Cardiovascular:     Rate and Rhythm: Normal rate.  Pulmonary:     Effort: Pulmonary effort is normal.  Abdominal:     General: There is no distension.  Musculoskeletal:        General: Tenderness present. No swelling, deformity or signs of injury.     Cervical back: Normal range of motion and neck supple. No rigidity.  Skin:    General: Skin is warm.     Capillary Refill: Capillary refill takes less than 2 seconds.     Findings: No rash.     Comments: Patient has mild tenderness and thickening in the skin lateral mid right foot and approximate 1 cm area of focal tenderness lateral mid plantar surface of the foot without signs of infection.  Neurological:     General: No focal deficit present.     Mental Status: He is alert.     Cranial Nerves: No cranial nerve deficit.  Psychiatric:        Mood and Affect: Mood normal.     ED Results / Procedures / Treatments   Labs (all labs ordered are listed, but only abnormal results are displayed) Labs Reviewed - No data to display  EKG None  Radiology DG Foot 2 Views Right  Result Date: 05/16/2022 CLINICAL DATA:  Fifth metatarsal pain, no injury. EXAM: RIGHT FOOT - 2 VIEW COMPARISON:  None Available. FINDINGS: There is no evidence of fracture or dislocation. There has been partial amputation of the distal phalanx of the first digit. Degenerative changes are noted at the first metatarsophalangeal joint. There is mild calcaneal spurring and enthesopathic changes are  present at the insertion site of the Achilles tendon. Vascular calcifications are noted in the soft tissues. IMPRESSION: No acute osseous abnormality. Electronically Signed   By: Thornell Sartorius M.D.   On: 05/16/2022 21:35    Procedures Procedures    Medications Ordered in ED Medications  acetaminophen (TYLENOL) tablet 1,000 mg (1,000 mg Oral Given 05/16/22 2103)    ED Course/ Medical Decision Making/ A&P  Medical Decision Making Amount and/or Complexity of Data Reviewed Radiology: ordered.  Risk OTC drugs.   Patient presents with persistent foot pain worse with ambulation, no signs of infection.  Discussed differential of callus, foreign body, stress fracture, other.  Plan for x-rays and patient had been referred to podiatry 2 days ago.  Tylenol ordered for pain.  X-ray reviewed independently no foreign body at least no metallic 1.  Patient stable for outpatient follow-up with podiatry.        Final Clinical Impression(s) / ED Diagnoses Final diagnoses:  Acute foot pain, right    Rx / DC Orders ED Discharge Orders     None         Blane Ohara, MD 05/16/22 2151

## 2022-05-16 NOTE — ED Triage Notes (Addendum)
Patient here with complaint of right foot pain he states started two days ago. Patient seen at urgent care for right foot pain on 3/21, diagnosed with a callus, and referred to podiatrist. Patient never made appointment with podiatrist.

## 2022-05-17 ENCOUNTER — Emergency Department (HOSPITAL_COMMUNITY)
Admission: EM | Admit: 2022-05-17 | Discharge: 2022-05-17 | Disposition: A | Discharge status: Home / Self Care | Payer: Medicare HMO | Attending: Emergency Medicine | Admitting: Emergency Medicine

## 2022-05-17 ENCOUNTER — Encounter (HOSPITAL_COMMUNITY): Payer: Self-pay

## 2022-05-17 ENCOUNTER — Other Ambulatory Visit: Payer: Self-pay

## 2022-05-17 ENCOUNTER — Ambulatory Visit (HOSPITAL_COMMUNITY)
Admission: RE | Admit: 2022-05-17 | Discharge: 2022-05-17 | Disposition: A | Discharge status: Home / Self Care | Payer: Medicare HMO | Source: Ambulatory Visit | Attending: Internal Medicine | Admitting: Internal Medicine

## 2022-05-17 DIAGNOSIS — E119 Type 2 diabetes mellitus without complications: Secondary | ICD-10-CM | POA: Insufficient documentation

## 2022-05-17 DIAGNOSIS — I10 Essential (primary) hypertension: Secondary | ICD-10-CM | POA: Diagnosis not present

## 2022-05-17 DIAGNOSIS — Z85118 Personal history of other malignant neoplasm of bronchus and lung: Secondary | ICD-10-CM | POA: Insufficient documentation

## 2022-05-17 DIAGNOSIS — C3490 Malignant neoplasm of unspecified part of unspecified bronchus or lung: Secondary | ICD-10-CM | POA: Diagnosis not present

## 2022-05-17 DIAGNOSIS — E1121 Type 2 diabetes mellitus with diabetic nephropathy: Secondary | ICD-10-CM | POA: Insufficient documentation

## 2022-05-17 DIAGNOSIS — J45909 Unspecified asthma, uncomplicated: Secondary | ICD-10-CM | POA: Diagnosis not present

## 2022-05-17 DIAGNOSIS — J9509 Other tracheostomy complication: Secondary | ICD-10-CM | POA: Diagnosis not present

## 2022-05-17 DIAGNOSIS — Z85038 Personal history of other malignant neoplasm of large intestine: Secondary | ICD-10-CM | POA: Diagnosis not present

## 2022-05-17 DIAGNOSIS — Z430 Encounter for attention to tracheostomy: Secondary | ICD-10-CM | POA: Diagnosis not present

## 2022-05-17 DIAGNOSIS — R911 Solitary pulmonary nodule: Secondary | ICD-10-CM | POA: Diagnosis not present

## 2022-05-17 DIAGNOSIS — C3432 Malignant neoplasm of lower lobe, left bronchus or lung: Secondary | ICD-10-CM | POA: Diagnosis not present

## 2022-05-17 DIAGNOSIS — Z87891 Personal history of nicotine dependence: Secondary | ICD-10-CM | POA: Insufficient documentation

## 2022-05-17 DIAGNOSIS — K5730 Diverticulosis of large intestine without perforation or abscess without bleeding: Secondary | ICD-10-CM | POA: Diagnosis not present

## 2022-05-17 LAB — POCT I-STAT CREATININE: Creatinine, Ser: 1.5 mg/dL — ABNORMAL HIGH (ref 0.61–1.24)

## 2022-05-17 MED ORDER — IOHEXOL 300 MG/ML  SOLN
60.0000 mL | Freq: Once | INTRAMUSCULAR | Status: AC | PRN
  Administered 2022-05-17: 75 mL via INTRAVENOUS

## 2022-05-17 MED ORDER — SODIUM CHLORIDE (PF) 0.9 % IJ SOLN
INTRAMUSCULAR | Status: AC
  Filled 2022-05-17: qty 50

## 2022-05-17 NOTE — Discharge Instructions (Signed)
We were able to replace your trach without difficulty.  Please follow with your cancer doctors this morning as well as your trach care team moving forward.

## 2022-05-17 NOTE — Progress Notes (Signed)
Pt came in with #6 uncuffed trach out after coughing. RT inserted a new #6 shiley uncuffed trach. Color change was noted on CO2 detector. Dr Jacqulyn Bath checked for BBS. Pt is tolerating well with an O2 saturation of 98%.

## 2022-05-17 NOTE — ED Provider Notes (Signed)
Emergency Department Provider Note   I have reviewed the triage vital signs and the nursing notes.   HISTORY  Chief Complaint Tracheostomy Tube Change   HPI Shaun Kennedy is a 82 y.o. male with tracheostomy for the last 3 years since to the emergency department for evaluation after his trach became dislodged early this morning.  He was on his way to the hospital here for a cancer appointment when the trach became suddenly dislodged.  He immediately walked to the emergency department.  He denies any distress. Current tube is a 6.0 uncuffed tube.    Past Medical History:  Diagnosis Date   Allergic rhinitis    Asthma    Carotid stenosis    Colon cancer (HCC) 2003   Diabetes mellitus (HCC)    Diverticulosis    Dyslipidemia    ED (erectile dysfunction)    GERD (gastroesophageal reflux disease)    H/O degenerative disc disease    Hemorrhoids    HTN (hypertension)    as a child   Hyperlipidemia    Lung cancer (HCC)    LVH (left ventricular hypertrophy)    on EKG   Mass of lower lobe of left lung    Mediastinal adenopathy    Smoker    former   Wears dentures    Wears dentures    Wears glasses    Wears glasses     Review of Systems  Constitutional: No fever/chills ENT: Dislodged trach Cardiovascular: Denies chest pain. Respiratory: Denies shortness of breath. Gastrointestinal: No abdominal pain.    ____________________________________________   PHYSICAL EXAM:  VITAL SIGNS: ED Triage Vitals  Enc Vitals Group     BP 05/17/22 0744 138/63     Pulse Rate 05/17/22 0744 81     Resp 05/17/22 0744 18     Temp 05/17/22 0744 97.8 F (36.6 C)     Temp Source 05/17/22 0744 Oral     SpO2 05/17/22 0744 100 %     Weight 05/17/22 0751 191 lb 12.8 oz (87 kg)    Constitutional: Alert and oriented. Well appearing and in no acute distress. Eyes: Conjunctivae are normal.  Head: Atraumatic. Nose: No congestion/rhinnorhea. Mouth/Throat: Mucous membranes are moist.   Oropharynx non-erythematous. Neck: No stridor.  Trach dislodged and at the bedside. No bleeding or skin irritation.  Cardiovascular: Normal rate, regular rhythm. Good peripheral circulation. Grossly normal heart sounds.   Respiratory: Normal respiratory effort.  No retractions. Lungs CTAB. Gastrointestinal: No distention.  Musculoskeletal: No gross deformities of extremities. Neurologic:  Normal speech and language. Skin:  Skin is warm, dry and intact. No rash noted.  ____________________________________________   PROCEDURES  Procedure(s) performed:   Procedures  None ____________________________________________   INITIAL IMPRESSION / ASSESSMENT AND PLAN / ED COURSE  Pertinent labs & imaging results that were available during my care of the patient were reviewed by me and considered in my medical decision making (see chart for details).   This patient is Presenting for Evaluation of trach dislodged, which does require a range of treatment options, and is a complaint that involves a high risk of morbidity and mortality.  The Differential Diagnoses include device failure, site infection, breakthrough bleeding, accidental dislodgement, etc.  Consult complete with Respiratory therapy who arrived with multiple trach sizes and were able to replace the same size trach at the bedside without complication.   Medical Decision Making: Summary:  Patient presents emergency department with dislodged tracheostomy while on his way to a cancer appointment this  morning.  No bleeding or signs of infection at the site.  I arrived into the room to find him very comfortable with no hypoxemia or increased work of breathing.  RT called to bedside with several sizes of trach and ultimately the trach was replaced by RT with a new 6 oh uncuffed tube. Good color change and bilateral breath sounds. No bleeding. Patient feeling well and comfortable after the procedure. Plan for observation and if continuing to do  well will d/c for his AM cancer clinic appointment.  Reevaluation with update and discussion with patient.  Sitting up in bed, no distress, normal vital signs.  Appears stable for discharge.   Patient's presentation is most consistent with acute presentation with potential threat to life or bodily function.   Disposition: discharge  ____________________________________________  FINAL CLINICAL IMPRESSION(S) / ED DIAGNOSES  Final diagnoses:  Attention to tracheostomy tube    Note:  This document was prepared using Dragon voice recognition software and may include unintentional dictation errors.  Alona Bene, MD, [facility] Emergency Medicine    Brandelyn Henne, Arlyss Repress, MD 05/17/22 [phone]

## 2022-05-17 NOTE — ED Triage Notes (Signed)
Pt presents with trach out after coughing. Inner cannula 6.13mm uncuffed. Attempted to reinsert inner cannula but had resistance. MD and respiratory in room. Patient is in no distress at this time.  Trach placed 3 years ago due to lung cancer per patient.

## 2022-05-19 ENCOUNTER — Telehealth: Payer: Self-pay

## 2022-05-19 NOTE — Telephone Encounter (Signed)
        Patient  visited [facility] on 05/17/2022  for tracheostomy tube change.   Telephone encounter attempt : 1st   A HIPAA compliant voice message was left requesting a return call.  Instructed patient to call back at [phone].   Zalia Hautala [facility]  [facility] Population Health Community Resource Care Guide   [email or web address]  ?? [phone]   Website: [email or web address]  [email or web address]

## 2022-05-19 NOTE — Telephone Encounter (Signed)
Called the patient to discuss scheduling the endoscopy and a pre-visit with a nurse for instructions.  No answer. No voicemail.

## 2022-05-23 ENCOUNTER — Telehealth: Payer: Self-pay

## 2022-05-23 DIAGNOSIS — H11153 Pinguecula, bilateral: Secondary | ICD-10-CM | POA: Diagnosis not present

## 2022-05-23 DIAGNOSIS — H524 Presbyopia: Secondary | ICD-10-CM | POA: Diagnosis not present

## 2022-05-23 DIAGNOSIS — H40013 Open angle with borderline findings, low risk, bilateral: Secondary | ICD-10-CM | POA: Diagnosis not present

## 2022-05-23 DIAGNOSIS — H35033 Hypertensive retinopathy, bilateral: Secondary | ICD-10-CM | POA: Diagnosis not present

## 2022-05-23 DIAGNOSIS — E119 Type 2 diabetes mellitus without complications: Secondary | ICD-10-CM | POA: Diagnosis not present

## 2022-05-23 LAB — HM DIABETES EYE EXAM

## 2022-05-23 NOTE — Telephone Encounter (Signed)
     Patient  visit on 05/17/2022  at [facility] was for Tracheostomy Tube Change.  Have you been able to follow up with your primary care physician? Yes  The patient was or was not able to obtain any needed medicine or equipment. No medication prescribed.  Are there diet recommendations that you are having difficulty following? No  Patient expresses understanding of discharge instructions and education provided has no other needs at this time. Yes   Gregory Dowe [facility]  [facility] Population Health Community Resource Care Guide   [email or web address]  ?? [phone]   Website: [email or web address]  [email or web address]

## 2022-05-30 ENCOUNTER — Other Ambulatory Visit: Payer: Self-pay

## 2022-05-30 ENCOUNTER — Inpatient Hospital Stay: Payer: Medicare HMO | Attending: Internal Medicine

## 2022-05-30 DIAGNOSIS — Z87891 Personal history of nicotine dependence: Secondary | ICD-10-CM | POA: Insufficient documentation

## 2022-05-30 DIAGNOSIS — Z79899 Other long term (current) drug therapy: Secondary | ICD-10-CM | POA: Diagnosis not present

## 2022-05-30 DIAGNOSIS — Z85118 Personal history of other malignant neoplasm of bronchus and lung: Secondary | ICD-10-CM | POA: Diagnosis not present

## 2022-05-30 DIAGNOSIS — C3432 Malignant neoplasm of lower lobe, left bronchus or lung: Secondary | ICD-10-CM

## 2022-05-30 DIAGNOSIS — Z85038 Personal history of other malignant neoplasm of large intestine: Secondary | ICD-10-CM | POA: Insufficient documentation

## 2022-05-30 DIAGNOSIS — Z9221 Personal history of antineoplastic chemotherapy: Secondary | ICD-10-CM | POA: Insufficient documentation

## 2022-05-30 DIAGNOSIS — Z95828 Presence of other vascular implants and grafts: Secondary | ICD-10-CM

## 2022-05-30 LAB — CBC WITH DIFFERENTIAL (CANCER CENTER ONLY)
Abs Immature Granulocytes: 0.01 10*3/uL (ref 0.00–0.07)
Basophils Absolute: 0 10*3/uL (ref 0.0–0.1)
Basophils Relative: 0 %
Eosinophils Absolute: 1.1 10*3/uL — ABNORMAL HIGH (ref 0.0–0.5)
Eosinophils Relative: 16 %
HCT: 31.7 % — ABNORMAL LOW (ref 39.0–52.0)
Hemoglobin: 10.6 g/dL — ABNORMAL LOW (ref 13.0–17.0)
Immature Granulocytes: 0 %
Lymphocytes Relative: 17 %
Lymphs Abs: 1.1 10*3/uL (ref 0.7–4.0)
MCH: 31.9 pg (ref 26.0–34.0)
MCHC: 33.4 g/dL (ref 30.0–36.0)
MCV: 95.5 fL (ref 80.0–100.0)
Monocytes Absolute: 0.7 10*3/uL (ref 0.1–1.0)
Monocytes Relative: 11 %
Neutro Abs: 3.8 10*3/uL (ref 1.7–7.7)
Neutrophils Relative %: 56 %
Platelet Count: 263 10*3/uL (ref 150–400)
RBC: 3.32 MIL/uL — ABNORMAL LOW (ref 4.22–5.81)
RDW: 12.6 % (ref 11.5–15.5)
WBC Count: 6.8 10*3/uL (ref 4.0–10.5)
nRBC: 0 % (ref 0.0–0.2)

## 2022-05-30 LAB — CMP (CANCER CENTER ONLY)
ALT: 15 U/L (ref 0–44)
AST: 20 U/L (ref 15–41)
Albumin: 3.6 g/dL (ref 3.5–5.0)
Alkaline Phosphatase: 54 U/L (ref 38–126)
Anion gap: 6 (ref 5–15)
BUN: 23 mg/dL (ref 8–23)
CO2: 27 mmol/L (ref 22–32)
Calcium: 9.1 mg/dL (ref 8.9–10.3)
Chloride: 107 mmol/L (ref 98–111)
Creatinine: 1.43 mg/dL — ABNORMAL HIGH (ref 0.61–1.24)
GFR, Estimated: 49 mL/min — ABNORMAL LOW (ref >60)
Glucose, Bld: 119 mg/dL — ABNORMAL HIGH (ref 70–99)
Potassium: 3.7 mmol/L (ref 3.5–5.1)
Sodium: 140 mmol/L (ref 135–145)
Total Bilirubin: 0.6 mg/dL (ref 0.3–1.2)
Total Protein: 8.1 g/dL (ref 6.5–8.1)

## 2022-05-30 MED ORDER — HEPARIN SOD (PORK) LOCK FLUSH 100 UNIT/ML IV SOLN
500.0000 [IU] | Freq: Once | INTRAVENOUS | Status: AC
  Administered 2022-05-30: 500 [IU]

## 2022-05-30 MED ORDER — SODIUM CHLORIDE 0.9% FLUSH
10.0000 mL | Freq: Once | INTRAVENOUS | Status: AC
  Administered 2022-05-30: 10 mL

## 2022-06-01 ENCOUNTER — Inpatient Hospital Stay (HOSPITAL_BASED_OUTPATIENT_CLINIC_OR_DEPARTMENT_OTHER): Payer: Medicare HMO | Admitting: Internal Medicine

## 2022-06-01 ENCOUNTER — Other Ambulatory Visit: Payer: Self-pay

## 2022-06-01 VITALS — BP 123/59 | HR 77 | Temp 98.1°F | Resp 15 | Wt 191.7 lb

## 2022-06-01 DIAGNOSIS — Z87891 Personal history of nicotine dependence: Secondary | ICD-10-CM | POA: Diagnosis not present

## 2022-06-01 DIAGNOSIS — C3490 Malignant neoplasm of unspecified part of unspecified bronchus or lung: Secondary | ICD-10-CM

## 2022-06-01 DIAGNOSIS — Z9221 Personal history of antineoplastic chemotherapy: Secondary | ICD-10-CM | POA: Diagnosis not present

## 2022-06-01 DIAGNOSIS — Z85118 Personal history of other malignant neoplasm of bronchus and lung: Secondary | ICD-10-CM | POA: Diagnosis not present

## 2022-06-01 DIAGNOSIS — Z85038 Personal history of other malignant neoplasm of large intestine: Secondary | ICD-10-CM | POA: Diagnosis not present

## 2022-06-01 DIAGNOSIS — Z79899 Other long term (current) drug therapy: Secondary | ICD-10-CM | POA: Diagnosis not present

## 2022-06-01 NOTE — Progress Notes (Signed)
[facility] [phone]   [phone]  OFFICE PROGRESS NOTE  Ronnald Nian, MD [address] [place] [address]  DIAGNOSIS: stage IV (T3, N2, M1 a) non-small cell lung cancer, squamous cell carcinoma presented with obstructive left lower lobe lung mass in addition to mediastinal lymphadenopathy as well as bilateral pulmonary nodules diagnosed in July 2021.   PDL1: 0%   PRIOR THERAPY:  1) Palliative radiotherapy to the obstructive left lower lobe lung mass under the care of Dr. Mitzi Hansen.  2)  systemic chemotherapy 2 cycles of chemotherapy with carboplatin for an AUC of 5 and paclitaxel 175 mg/m2 in addition to immunotherapy with nivolumab 360 mg every 3 weeks and ipilimumab 1 mg/kg IV every 6 weeks followed by maintenance nivolumab and ipilimumab.  He started the first treatment on 09/04/2019.  He is status post 22 cycles.  Last was giving on February 16, 2022.  This treatment was discontinued after the patient completed more than 2 years on the immunotherapy.   CURRENT THERAPY: Observation  INTERVAL HISTORY: AGNES PROBERT 82 y.o. male returns to the clinic today for follow-up visit.  The patient is feeling fine today with no concerning complaints.  He has no chest pain, shortness of breath, cough or hemoptysis.  He has no nausea, vomiting, diarrhea or constipation.  He has no headache or visual changes.  He denied having any fever or chills.  Is here today for evaluation with repeat CT scan of the chest, abdomen and pelvis for restaging of his disease.  MEDICAL HISTORY: Past Medical History:  Diagnosis Date   Allergic rhinitis    Asthma    Carotid stenosis    Colon cancer 2003   Diabetes mellitus    Diverticulosis    Dyslipidemia    ED (erectile dysfunction)    GERD (gastroesophageal reflux disease)    H/O degenerative disc disease    Hemorrhoids    HTN (hypertension)    as a child   Hyperlipidemia    Lung cancer    LVH (left  ventricular hypertrophy)    on EKG   Mass of lower lobe of left lung    Mediastinal adenopathy    Smoker    former   Wears dentures    Wears dentures    Wears glasses    Wears glasses     ALLERGIES:  has No Known Allergies.  MEDICATIONS:  Current Outpatient Medications  Medication Sig Dispense Refill   Alcohol Swabs (DROPSAFE ALCOHOL PREP) 70 % PADS Apply topically.     amLODipine (NORVASC) 5 MG tablet Take 1 tablet (5 mg total) by mouth daily. 90 tablet 3   aspirin EC 81 MG tablet Take 81 mg by mouth daily after breakfast.      betamethasone valerate ointment (VALISONE) 0.1 % APPLY 1 APPLICATION TOPICALLY 2 TIMES DAILY (Patient not taking: Reported on 04/04/2022) 45 g 0   bisacodyl (DULCOLAX) 5 MG EC tablet Take 10 mg by mouth daily as needed for moderate constipation.     Blood Glucose Calibration (TRUE METRIX LEVEL 1) Low SOLN      Blood Glucose Monitoring Suppl (TRUE METRIX AIR GLUCOSE METER) w/Device KIT USE AS DIRECTED 1 kit 0   Borage, Borago officinalis, (BORAGE OIL PO) Take 2,300 mg by mouth.     cholecalciferol (VITAMIN D) 25 MCG (1000 UNIT) tablet Take 1,000 Units by mouth daily.     donepezil (ARICEPT) 5 MG tablet Take 1 tablet (5 mg  total) by mouth at bedtime. 90 tablet 3   dronabinol (MARINOL) 2.5 MG capsule Take 1 capsule (2.5 mg total) by mouth 2 (two) times daily before a meal. 60 capsule 1   ferrous sulfate 325 (65 FE) MG EC tablet TAKE 1 TABLET (325 MG TOTAL) DAILY WITH BREAKFAST. 90 tablet 1   gabapentin (NEURONTIN) 100 MG capsule Take by mouth.     hydrOXYzine (ATARAX) 10 MG tablet Take 1 tablet (10 mg total) by mouth 3 (three) times daily as needed. 90 tablet 1   Lancet Devices (TRUEDRAW LANCING DEVICE) MISC Check sugars daily 90 each 0   linaclotide (LINZESS) 72 MCG capsule Take by mouth.     lisinopril-hydrochlorothiazide (ZESTORETIC) 20-12.5 MG tablet Take 1 tablet by mouth daily. 90 tablet 3   metFORMIN (GLUCOPHAGE-XR) 500 MG 24 hr tablet Take 1 tablet (500  mg total) by mouth daily. 90 tablet 1   Multiple Vitamin (MULTIVITAMIN WITH MINERALS) TABS tablet Take 1 tablet by mouth daily after breakfast.      simvastatin (ZOCOR) 40 MG tablet TAKE 1 TABLET EVERY DAY 90 tablet 1   solifenacin (VESICARE) 5 MG tablet TAKE 1 TABLET EVERY DAY 90 tablet 1   TRUE METRIX BLOOD GLUCOSE TEST test strip TEST BLOOD GLUCOSE  UP TO TWO TIMES DAILY 200 strip 2   TRUEplus Lancets 33G MISC 1 each by Does not apply route 2 (two) times daily. 200 each 4   No current facility-administered medications for this visit.    SURGICAL HISTORY:  Past Surgical History:  Procedure Laterality Date   BRONCHIAL BIOPSY  08/20/2019   Procedure: BRONCHIAL BIOPSIES;  Surgeon: Leslye Peer, MD;  Location: [facility] ENDOSCOPY;  Service: Pulmonary;;   BRONCHIAL BRUSHINGS  08/20/2019   Procedure: BRONCHIAL BRUSHINGS;  Surgeon: Leslye Peer, MD;  Location: [facility] ENDOSCOPY;  Service: Pulmonary;;   BRONCHIAL NEEDLE ASPIRATION BIOPSY  08/20/2019   Procedure: BRONCHIAL NEEDLE ASPIRATION BIOPSIES;  Surgeon: Leslye Peer, MD;  Location: [facility] ENDOSCOPY;  Service: Pulmonary;;   CARPAL TUNNEL RELEASE Right 01/09/2019   Procedure: CARPAL TUNNEL RELEASE;  Surgeon: Tarry Kos, MD;  Location: [facility];  Service: Orthopedics;  Laterality: Right;   CARPAL TUNNEL WITH CUBITAL TUNNEL Right 11/18/2020   Procedure: CARPAL TUNNEL WITH CUBITAL TUNNEL;  Surgeon: Dairl Ponder, MD;  Location: [facility] OR;  Service: Orthopedics;  Laterality: Right;   CATARACT EXTRACTION Right 2018   COLONOSCOPY  2007   Dr. Elnoria Little   DIRECT LARYNGOSCOPY N/A 04/10/2020   Procedure: DIRECT LARYNGOSCOPY;  Surgeon: Christia Reading, MD;  Location[facility] OR;  Service: ENT;  Laterality: N/A;   I & D EXTREMITY Right 04/02/2016   Procedure: IRRIGATION AND DEBRIDEMENT GREAT TOE;  Surgeon: Tarry Kos, MD;  Location: [facility] OR;  Service: Orthopedics;  Laterality: Right;   IR IMAGING GUIDED PORT INSERTION  08/30/2019   MULTIPLE TOOTH  EXTRACTIONS     RADIOACTIVE SEED IMPLANT  2003   TRACHEOSTOMY TUBE PLACEMENT N/A 04/10/2020   Procedure: TRACHEOSTOMY;  Surgeon: Christia Reading, MD;  Location: [facility] OR;  Service: ENT;  Laterality: N/A;   ULNAR TUNNEL RELEASE Right 01/09/2019   Procedure: RIGHT CUBITAL TUNNEL RELEASE AND CARPAL TUNNEL RELEASE;  Surgeon: Tarry Kos, MD;  Location: [facility];  Service: Orthopedics;  Laterality: Right;   UPPER GASTROINTESTINAL ENDOSCOPY     VIDEO BRONCHOSCOPY N/A 12/18/2019   Procedure: VIDEO BRONCHOSCOPY WITHOUT FLUORO;  Surgeon: Leslye Peer, MD;  Location: [facility] ENDOSCOPY;  Service: Cardiopulmonary;  Laterality: N/A;   VIDEO BRONCHOSCOPY WITH ENDOBRONCHIAL ULTRASOUND N/A 08/20/2019   Procedure: VIDEO BRONCHOSCOPY WITH ENDOBRONCHIAL ULTRASOUND;  Surgeon: Leslye Peer, MD;  Location: [facility] ENDOSCOPY;  Service: Pulmonary;  Laterality: N/A;    REVIEW OF SYSTEMS:  A comprehensive review of systems was negative.   PHYSICAL EXAMINATION: General appearance: alert, cooperative, and no distress Head: Normocephalic, without obvious abnormality, atraumatic Neck: no adenopathy, no JVD, supple, symmetrical, trachea midline, and thyroid not enlarged, symmetric, no tenderness/mass/nodules Lymph nodes: Cervical, supraclavicular, and axillary nodes normal. Resp: clear to auscultation bilaterally Back: symmetric, no curvature. ROM normal. No CVA tenderness. Cardio: regular rate and rhythm, S1, S2 normal, no murmur, click, rub or gallop GI: soft, non-tender; bowel sounds normal; no masses,  no organomegaly Extremities: extremities normal, atraumatic, no cyanosis or edema  ECOG PERFORMANCE STATUS: 1 - Symptomatic but completely ambulatory  Blood pressure (!) 123/59, pulse 77, temperature 98.1 F (36.7 C), temperature source Temporal, resp. rate 15, weight 191 lb 11.2 oz (87 kg), SpO2 95 %.  LABORATORY DATA: Lab Results  Component Value Date   WBC 6.8 05/30/2022   HGB 10.6 (L) 05/30/2022    HCT 31.7 (L) 05/30/2022   MCV 95.5 05/30/2022   PLT 263 05/30/2022      Chemistry      Component Value Date/Time   NA 140 05/30/2022 1021   NA 146 (H) 10/04/2021 1616   K 3.7 05/30/2022 1021   CL 107 05/30/2022 1021   CO2 27 05/30/2022 1021   BUN 23 05/30/2022 1021   BUN 12 10/04/2021 1616   CREATININE 1.43 (H) 05/30/2022 1021   CREATININE 1.25 (H) 08/08/2016 0912      Component Value Date/Time   CALCIUM 9.1 05/30/2022 1021   ALKPHOS 54 05/30/2022 1021   AST 20 05/30/2022 1021   ALT 15 05/30/2022 1021   BILITOT 0.6 05/30/2022 1021       RADIOGRAPHIC STUDIES: CT Chest W Contrast  Result Date: 05/18/2022 CLINICAL DATA:  Stage IV left lung cancer, status post chemotherapy and radiation EXAM: CT CHEST, ABDOMEN, AND PELVIS WITH CONTRAST TECHNIQUE: Multidetector CT imaging of the chest, abdomen and pelvis was performed following the standard protocol during bolus administration of intravenous contrast. RADIATION DOSE REDUCTION: This exam was performed according to the departmental dose-optimization program which includes automated exposure control, adjustment of the mA and/or kV according to patient size and/or use of iterative reconstruction technique. CONTRAST:  75mL OMNIPAQUE IOHEXOL 300 MG/ML  SOLN COMPARISON:  02/10/2022 FINDINGS: CT CHEST FINDINGS Cardiovascular: Heart is top-normal in size. No pericardial effusion. No evidence thoracic aortic aneurysm. Atherosclerotic calcifications of the aortic arch. Moderate three-vessel coronary atherosclerosis. Right chest port terminates at the cavoatrial junction. Mediastinum/Nodes: No suspicious mediastinal lymphadenopathy. Visualized thyroid is unremarkable. Lungs/Pleura: Tracheostomy in satisfactory position. Radiation changes in the medial left hemithorax. Evaluation lung parenchyma is constrained by respiratory motion. Within that constraint, there is a single discrete 7 mm subpleural left lower lobe nodule (series 4/image 80), unchanged.  Trace left pleural effusion, unchanged. Musculoskeletal: Mild degenerative changes of the lower thoracic spine. CT ABDOMEN PELVIS FINDINGS Hepatobiliary: Liver is within normal limits. No suspicious enhancing hepatic lesion. Gallbladder is unremarkable. No intrahepatic or extrahepatic ductal dilatation. Pancreas: Within normal limits. Spleen: Within normal limits. Adrenals/Urinary Tract: Adrenal glands are within normal limits. 2.0 cm cyst in the medial right upper kidney (series 2/image 51), measuring simple fluid density, benign (Bosniak I). No follow-up is recommended. Left kidney within normal limits. No hydronephrosis. Mildly thick-walled bladder, although underdistended,  unchanged. Stomach/Bowel: Stomach is within normal limits. No evidence of bowel obstruction. Appendix not discretely visualized. Left colonic diverticulosis, without evidence of diverticulitis. Mild mildly increased rectal stool burden. Vascular/Lymphatic: No evidence of abdominal aortic aneurysm. Atherosclerotic calcifications of the abdominal aorta and branch vessels. No suspicious abdominopelvic lymphadenopathy. Reproductive: Brachytherapy seeds in the prostate. Other: No abdominopelvic ascites. Musculoskeletal: Degenerative changes of the lumbar spine. IMPRESSION: Radiation changes in the medial left hemithorax. Stable 7 mm left lower lobe pulmonary nodule. No evidence of new/progressive metastatic disease. Trace left pleural effusion, unchanged. Brachytherapy seeds in the prostate. No evidence of metastatic disease in the abdomen/pelvis. Additional stable ancillary findings as above. Electronically Signed   By: Charline Bills M.D.   On: 05/18/2022 01:25   CT Abdomen Pelvis W Contrast  Result Date: 05/18/2022 CLINICAL DATA:  Stage IV left lung cancer, status post chemotherapy and radiation EXAM: CT CHEST, ABDOMEN, AND PELVIS WITH CONTRAST TECHNIQUE: Multidetector CT imaging of the chest, abdomen and pelvis was performed following  the standard protocol during bolus administration of intravenous contrast. RADIATION DOSE REDUCTION: This exam was performed according to the departmental dose-optimization program which includes automated exposure control, adjustment of the mA and/or kV according to patient size and/or use of iterative reconstruction technique. CONTRAST:  75mL OMNIPAQUE IOHEXOL 300 MG/ML  SOLN COMPARISON:  02/10/2022 FINDINGS: CT CHEST FINDINGS Cardiovascular: Heart is top-normal in size. No pericardial effusion. No evidence thoracic aortic aneurysm. Atherosclerotic calcifications of the aortic arch. Moderate three-vessel coronary atherosclerosis. Right chest port terminates at the cavoatrial junction. Mediastinum/Nodes: No suspicious mediastinal lymphadenopathy. Visualized thyroid is unremarkable. Lungs/Pleura: Tracheostomy in satisfactory position. Radiation changes in the medial left hemithorax. Evaluation lung parenchyma is constrained by respiratory motion. Within that constraint, there is a single discrete 7 mm subpleural left lower lobe nodule (series 4/image 80), unchanged. Trace left pleural effusion, unchanged. Musculoskeletal: Mild degenerative changes of the lower thoracic spine. CT ABDOMEN PELVIS FINDINGS Hepatobiliary: Liver is within normal limits. No suspicious enhancing hepatic lesion. Gallbladder is unremarkable. No intrahepatic or extrahepatic ductal dilatation. Pancreas: Within normal limits. Spleen: Within normal limits. Adrenals/Urinary Tract: Adrenal glands are within normal limits. 2.0 cm cyst in the medial right upper kidney (series 2/image 51), measuring simple fluid density, benign (Bosniak I). No follow-up is recommended. Left kidney within normal limits. No hydronephrosis. Mildly thick-walled bladder, although underdistended, unchanged. Stomach/Bowel: Stomach is within normal limits. No evidence of bowel obstruction. Appendix not discretely visualized. Left colonic diverticulosis, without evidence of  diverticulitis. Mild mildly increased rectal stool burden. Vascular/Lymphatic: No evidence of abdominal aortic aneurysm. Atherosclerotic calcifications of the abdominal aorta and branch vessels. No suspicious abdominopelvic lymphadenopathy. Reproductive: Brachytherapy seeds in the prostate. Other: No abdominopelvic ascites. Musculoskeletal: Degenerative changes of the lumbar spine. IMPRESSION: Radiation changes in the medial left hemithorax. Stable 7 mm left lower lobe pulmonary nodule. No evidence of new/progressive metastatic disease. Trace left pleural effusion, unchanged. Brachytherapy seeds in the prostate. No evidence of metastatic disease in the abdomen/pelvis. Additional stable ancillary findings as above. Electronically Signed   By: Charline Bills M.D.   On: 05/18/2022 01:25   DG Foot 2 Views Right  Result Date: 05/16/2022 CLINICAL DATA:  Fifth metatarsal pain, no injury. EXAM: RIGHT FOOT - 2 VIEW COMPARISON:  None Available. FINDINGS: There is no evidence of fracture or dislocation. There has been partial amputation of the distal phalanx of the first digit. Degenerative changes are noted at the first metatarsophalangeal joint. There is mild calcaneal spurring and enthesopathic changes are present at the insertion  site of the Achilles tendon. Vascular calcifications are noted in the soft tissues. IMPRESSION: No acute osseous abnormality. Electronically Signed   By: Thornell Sartorius M.D.   On: 05/16/2022 21:35    ASSESSMENT AND PLAN: This is a very pleasant 82 years old [name] with stage IV non-small cell lung cancer, squamous cell carcinoma with negative PD-L1 expression. The patient underwent systemic chemotherapy with carboplatin and paclitaxel for 2 cycles in addition to immunotherapy with ipilimumab 1 mg/KG every 6 weeks and nivolumab 360 mg IV every 3 weeks status post 22  cycles. The patient is currently on observation and he is feeling fine with no concerning complaints. He had  repeat CT scan of the chest, abdomen and pelvis performed recently.  I personally and independently reviewed the scan and discussed the result with the patient today. His scan showed no concerning findings for disease progression. I recommended for him to continue on observation with repeat CT scan of the chest, abdomen and pelvis in 3 months. He was advised to call immediately if he has any other concerning symptoms in the interval. The patient voices understanding of current disease status and treatment options and is in agreement with the current care plan.  All questions were answered. The patient knows to call the clinic with any problems, questions or concerns. We can certainly see the patient much sooner if necessary. The total time spent in the appointment was 20 minutes.  Disclaimer: This note was dictated with voice recognition software. Similar sounding words can inadvertently be transcribed and may not be corrected upon review.

## 2022-06-02 ENCOUNTER — Ambulatory Visit: Payer: Self-pay

## 2022-06-02 NOTE — Patient Instructions (Signed)
Visit Information  Thank you for taking time to visit with me today. Please don't hesitate to contact me if I can be of assistance to you.   Following are the goals we discussed today:   Goals Addressed             This Visit's Progress    To have right foot callus resolve without complications       Care Coordination Interventions: Evaluation of current treatment plan related to right foot callus and patient's adherence to plan as established by provider Reviewed and discussed recent Urgent care visit and ED visit for pain and discomfort of right foot callus Confirmed patient followed up with Podiatry for evaluation, no interventions were given and patient was advised the callus should resolve Educated patient about the importance of performing daily inspections of his feet and to report new or worsening symptoms to his Podiatrist and or PCP promptly Educated patient while providing rationale about the risk for complications from this callus secondary to having type II dm Discussed potential need for diabetic shoes if the callus worsens or new problems with feet arise Instructed patient to wear good supportive and well fitted shoes to help avoid further issues with his feet and patient verbalizes understanding            Our next appointment is by telephone on 07/05/22 :30 AM   Please call the care guide team at [phone] if you need to cancel or reschedule your appointment.   If you are experiencing a Mental Health or Behavioral Health Crisis or need someone to talk to, please call 1-800-273-TALK (toll free, 24 hour hotline) go to [facility] [address], [place] [phone])  Patient verbalizes understanding of instructions and care plan provided today and agrees to view in MyChart. Active MyChart status and patient understanding of how to access instructions and care plan via MyChart confirmed with patient.     Delsa Sale,  RN, BSN, CCM Care Management Coordinator [facility] Care Management  Direct Phone: [phone]

## 2022-06-02 NOTE — Patient Outreach (Signed)
  Care Coordination   Follow Up Visit Note   06/02/2022 Name: Shaun Kennedy MRN: [id number] DOB: [birth year]  Shaun Kennedy is a 82 y.o. year old male who sees Shaun Nian, MD for primary care. I spoke with  Shaun Kennedy by phone today.  What matters to the patients health and wellness today?  Patient would like to have no complications from his right foot callus.     Goals Addressed             This Visit's Progress    To have right foot callus resolve without complications       Care Coordination Interventions: Evaluation of current treatment plan related to right foot callus and patient's adherence to plan as established by provider Reviewed and discussed recent Urgent care visit and ED visit for pain and discomfort of right foot callus Confirmed patient followed up with Podiatry for evaluation, no interventions were given and patient was advised the callus should resolve Educated patient about the importance of performing daily inspections of his feet and to report new or worsening symptoms to his Podiatrist and or PCP promptly Educated patient while providing rationale about the risk for complications from this callus secondary to having type II dm Discussed potential need for diabetic shoes if the callus worsens or new problems with feet arise Instructed patient to wear good supportive and well fitted shoes to help avoid further issues with his feet and patient verbalizes understanding     Interventions Today    Flowsheet Row Most Recent Value  Chronic Disease   Chronic disease during today's visit Diabetes, Other  [Cancer, right foot callus]  General Interventions   General Interventions Discussed/Reviewed General Interventions Discussed, General Interventions Reviewed, Labs, Annual Foot Exam, Doctor Visits  Doctor Visits Discussed/Reviewed Doctor Visits Reviewed, Doctor Visits Discussed, Specialist  Education Interventions   Education Provided Provided Education   Provided Verbal Education On Labs, When to see the doctor          SDOH assessments and interventions completed:  No     Care Coordination Interventions:  Yes, provided   Follow up plan: Follow up call scheduled for 07/05/22 :30 AM    Encounter Outcome:  Pt. Visit Completed

## 2022-06-14 NOTE — Telephone Encounter (Signed)
Called patient's listed number. No answer. Left a voicemail of my call and asked for a return call. Patient needs to be scheduled for an enteroscopy in the [facility] with Dr Meridee Score. He will need a pre-visit.

## 2022-06-14 NOTE — Telephone Encounter (Signed)
Beth, That would be fine. Thanks. GM

## 2022-06-17 ENCOUNTER — Encounter: Payer: Self-pay | Admitting: Gastroenterology

## 2022-06-17 NOTE — Telephone Encounter (Signed)
Called to discuss scheduling the enteroscopy for the patient in the [facility]. No answer. Left a message of my call. Asked for a call to discuss or to schedule.

## 2022-06-17 NOTE — Telephone Encounter (Signed)
Enteroscopy-6/18 10am PV Rm 50 5/28 2pm

## 2022-06-22 ENCOUNTER — Ambulatory Visit (HOSPITAL_COMMUNITY)
Admission: RE | Admit: 2022-06-22 | Discharge: 2022-06-22 | Disposition: A | Discharge status: Home / Self Care | Payer: Medicare HMO | Source: Ambulatory Visit | Attending: Acute Care | Admitting: Acute Care

## 2022-06-22 ENCOUNTER — Other Ambulatory Visit: Payer: Self-pay | Admitting: Acute Care

## 2022-06-22 DIAGNOSIS — Z930 Tracheostomy status: Secondary | ICD-10-CM | POA: Diagnosis not present

## 2022-06-22 DIAGNOSIS — J386 Stenosis of larynx: Secondary | ICD-10-CM | POA: Diagnosis present

## 2022-06-22 DIAGNOSIS — J440 Chronic obstructive pulmonary disease with acute lower respiratory infection: Secondary | ICD-10-CM | POA: Diagnosis not present

## 2022-06-22 DIAGNOSIS — J209 Acute bronchitis, unspecified: Secondary | ICD-10-CM | POA: Insufficient documentation

## 2022-06-22 MED ORDER — AZITHROMYCIN 250 MG PO TABS: ORAL_TABLET | ORAL | 0 refills | Status: DC

## 2022-06-22 NOTE — Progress Notes (Signed)
See note from visit Simonne Martinet ACNP-BC [facility] Pulmonary/Critical Care Pager # [phone] OR # [phone] if no answer

## 2022-06-22 NOTE — Progress Notes (Signed)
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  I saw him last 9/19. He continues to follow w/ Onc. He has completed palliative XRT 2021, Last CT imaging raised concern for possible mets to bowel he has just completed cycle 20 of 22 for chemo. Presents today for planned trach change on 2/14  Review of Systems  Constitutional:  Negative for chills and fever.  HENT:  Positive for congestion.   Eyes:  Positive for discharge and redness.  Respiratory:  Positive for cough and sputum production. Negative for shortness of breath and wheezing.   Cardiovascular: Negative.   Gastrointestinal: Negative.   Genitourinary: Negative.   Musculoskeletal: Negative.   Skin: Negative.   Neurological: Negative.   Endo/Heme/Allergies:  Positive for environmental allergies.  Psychiatric/Behavioral: Negative.     Exam  General 82 year old male ambulatory and in no distress HENT NCAT his size 6 trach is unremarkable w/ exception of increased tracheal secretions c/w baseline. He also has runny nose and watery eyes.  Pulm scattered rhonchi Card rrr Abd soft Ext warm dry Neuro intact  Procedure  The # 6 trach was removed. Site inspected. Looks good. New trach site placed over obturator. Site verified via ETCo2. Yellow thick tracheal secretions   Impression/plan  Active Problems:   Tracheostomy status (HCC)   Glottic stenosis   Tracheostomy status (HCC) Overview:  Trach Brand: Shiley Size:  6.0 [id number] Style: Uncuffed Last change 5/15 prior was 2/14 Discussion Stable trach, not a candidate for trach decannulation d/t subglottic stenosis  Plan Cont routine trach care ROV 12 weeks   Acute bronchitis with COPD (HCC) Overview: Increased sputum production.  Yellow tinged. Probably allergy but he almost never has secretions.  Plan Script sent for azithro If mucous color changes he will  start (at this point clear when he coughs but trach internal cannula yellow thick mucous.       My time 20 min  Simonne Martinet ACNP-BC [facility] Pulmonary/Critical Care Pager # [phone] OR # [phone] if no answer

## 2022-06-22 NOTE — Progress Notes (Signed)
Tracheostomy Procedure Note  WILLS ELMENDORF [id number] [birth year]  Pre Procedure Tracheostomy Information  Trach Brand: Shiley Size:  6.0 [id number] Style: Uncuffed Secured by: Velcro   Procedure: Trach Change and Trach cleaning    Post Procedure Tracheostomy Information  Trach Brand: Shiley Size:  6.0 [id number] Style: Uncuffed Secured by: Velcro   Post Procedure Evaluation:  ETCO2 positive color change from yellow to purple : Yes.   Vital signs:VSS Patients current condition: stable Complications: No apparent complications Trach site exam: clean, dry Wound care done: 4 x 4 drain gauze applied Patient did tolerate procedure w ell.   Education: none  Prescription needs: none    Additional needs: New PMV given to patient at this visit

## 2022-06-23 ENCOUNTER — Ambulatory Visit (INDEPENDENT_AMBULATORY_CARE_PROVIDER_SITE_OTHER): Payer: Medicare HMO | Admitting: Medical

## 2022-06-23 VITALS — BP 120/68 | HR 74 | Wt 187.6 lb

## 2022-06-23 DIAGNOSIS — E785 Hyperlipidemia, unspecified: Secondary | ICD-10-CM

## 2022-06-23 DIAGNOSIS — I6522 Occlusion and stenosis of left carotid artery: Secondary | ICD-10-CM

## 2022-06-23 DIAGNOSIS — J398 Other specified diseases of upper respiratory tract: Secondary | ICD-10-CM | POA: Diagnosis not present

## 2022-06-23 DIAGNOSIS — G3184 Mild cognitive impairment, so stated: Secondary | ICD-10-CM | POA: Diagnosis not present

## 2022-06-23 DIAGNOSIS — E1169 Type 2 diabetes mellitus with other specified complication: Secondary | ICD-10-CM | POA: Diagnosis not present

## 2022-06-23 DIAGNOSIS — I152 Hypertension secondary to endocrine disorders: Secondary | ICD-10-CM | POA: Diagnosis not present

## 2022-06-23 DIAGNOSIS — E1159 Type 2 diabetes mellitus with other circulatory complications: Secondary | ICD-10-CM

## 2022-06-23 DIAGNOSIS — E119 Type 2 diabetes mellitus without complications: Secondary | ICD-10-CM

## 2022-06-23 DIAGNOSIS — Z7189 Other specified counseling: Secondary | ICD-10-CM | POA: Diagnosis not present

## 2022-06-23 DIAGNOSIS — I700 Atherosclerosis of aorta: Secondary | ICD-10-CM

## 2022-06-23 MED ORDER — DONEPEZIL HCL 10 MG PO TABS: 10.0000 mg | ORAL_TABLET | Freq: Every day | ORAL | 0 refills | Status: DC

## 2022-06-23 NOTE — Progress Notes (Signed)
Subjective:  Shaun Kennedy is a 82 y.o. male who presents for Chief Complaint  Patient presents with   Consult    Wife feels like his memory medication isn't helping and needs to be upped     Here with wife for concerns about memory.  Wife thinks his memory pills need to be increased on dose.   Wife says she normally writes things on the calendar.   He was looking at the calendar and thought today was Monday.  He seems to be forgetful and not as sharp as she would like to have it.  He lives at home with wife.   They have a son and he has 2 sisters all in [place].   They see son 2-3 times per week.     Wife notes that others have commented on his memory.     He and wife both still drive.   Doesn't seem to forget where he is going when driving.  Wife does most of the driving.     Doesn't misplace things at home.  Every now and then visits with friends, goes to church periodically ,not weekly.  Upon medication reviewer: He notes taking hydroxyzine BID typically although listed at TID.  Uses this per oncology for itching  Uses gabapentin prn, not regularly.  He plays golf every T/Th.    No other aggravating or relieving factors.    No other c/o.   Past Medical History:  Diagnosis Date   Allergic rhinitis    Asthma    Carotid stenosis    Colon cancer (HCC) 2003   Diabetes mellitus (HCC)    Diverticulosis    Dyslipidemia    ED (erectile dysfunction)    GERD (gastroesophageal reflux disease)    H/O degenerative disc disease    Hemorrhoids    HTN (hypertension)    as a child   Hyperlipidemia    Lung cancer (HCC)    LVH (left ventricular hypertrophy)    on EKG   Mass of lower lobe of left lung    Mediastinal adenopathy    Smoker    former   Wears dentures    Wears dentures    Wears glasses    Wears glasses    Current Outpatient Medications on File Prior to Visit  Medication Sig Dispense Refill   amLODipine (NORVASC) 5 MG tablet Take 1 tablet (5 mg total) by  mouth daily. 90 tablet 3   aspirin EC 81 MG tablet Take 81 mg by mouth daily after breakfast.      bisacodyl (DULCOLAX) 5 MG EC tablet Take 10 mg by mouth daily as needed for moderate constipation.     cholecalciferol (VITAMIN D) 25 MCG (1000 UNIT) tablet Take 1,000 Units by mouth daily.     ferrous sulfate 325 (65 FE) MG EC tablet TAKE 1 TABLET (325 MG TOTAL) DAILY WITH BREAKFAST. 90 tablet 1   gabapentin (NEURONTIN) 100 MG capsule Take by mouth.     hydrOXYzine (ATARAX) 10 MG tablet Take 1 tablet (10 mg total) by mouth 3 (three) times daily as needed. 90 tablet 1   lisinopril-hydrochlorothiazide (ZESTORETIC) 20-12.5 MG tablet Take 1 tablet by mouth daily. 90 tablet 3   metFORMIN (GLUCOPHAGE-XR) 500 MG 24 hr tablet Take 1 tablet (500 mg total) by mouth daily. 90 tablet 1   Multiple Vitamin (MULTIVITAMIN WITH MINERALS) TABS tablet Take 1 tablet by mouth daily after breakfast.      simvastatin (ZOCOR) 40 MG tablet TAKE 1  TABLET EVERY DAY 90 tablet 1   solifenacin (VESICARE) 5 MG tablet TAKE 1 TABLET EVERY DAY 90 tablet 1   Alcohol Swabs (DROPSAFE ALCOHOL PREP) 70 % PADS Apply topically.     Blood Glucose Calibration (TRUE METRIX LEVEL 1) Low SOLN      Blood Glucose Monitoring Suppl (TRUE METRIX AIR GLUCOSE METER) w/Device KIT USE AS DIRECTED 1 kit 0   Lancet Devices (TRUEDRAW LANCING DEVICE) MISC Check sugars daily 90 each 0   TRUEplus Lancets 33G MISC 1 each by Does not apply route 2 (two) times daily. 200 each 4   No current facility-administered medications on file prior to visit.     The following portions of the patient's history were reviewed and updated as appropriate: allergies, current medications, past family history, past medical history, past social history, past surgical history and problem list.  ROS Otherwise as in subjective above  Objective: BP 120/68   Pulse 74   Wt 187 lb 9.6 oz (85.1 kg)   BMI 28.95 kg/m   General appearance: alert, no distress, well developed,  well nourished Pulses: 2+ radial pulses, 2+ pedal pulses, normal cap refill Ext: no edema Neuro: CN2-12 intact, nonfocal exam Psych Pleasant, good eye contact, answers questions approval    Assessment: Encounter Diagnoses  Name Primary?   Mild cognitive impairment Yes   Diabetes mellitus type II, non insulin dependent (HCC)    Stenosis of left carotid artery    Atherosclerosis of aorta (HCC)    Trachea, stenosis    Hyperlipidemia associated with type 2 diabetes mellitus (HCC)    Hypertension associated with diabetes (HCC)      Plan: We discussed symptoms, concerns.  Counseled on non pharmacological ways to help with memory.  Recommended a consistent routine.  Continue playing golf with your buddies regularly, continue regular church attendance and interact with people on a regular basis, continue plan gains like you are doing such as cards with your wife and friends.  Read regularly  Increase Donepezil dose to 10 mg.  Consider neurology consult going forward.  They declines today  Continue with your other current medicines.  Of note he is not taking gabapentin regularly.  He is using hydroxyzine twice a day but does not seem to have problems with sedation or other side effects with medication.  He uses this for itching  I counseled on advanced directives and getting these updated.   [name] was seen today for consult.  Diagnoses and all orders for this visit:  Mild cognitive impairment  Diabetes mellitus type II, non insulin dependent (HCC)  Stenosis of left carotid artery  Atherosclerosis of aorta (HCC)  Trachea, stenosis  Hyperlipidemia associated with type 2 diabetes mellitus (HCC)  Hypertension associated with diabetes (HCC)  Other orders -     donepezil (ARICEPT) 10 MG tablet; Take 1 tablet (10 mg total) by mouth at bedtime.    Follow up: 69mo

## 2022-07-05 ENCOUNTER — Telehealth: Payer: Self-pay

## 2022-07-05 ENCOUNTER — Ambulatory Visit: Payer: Self-pay

## 2022-07-05 NOTE — Patient Outreach (Signed)
  Care Coordination   07/05/2022 Name: Shaun Kennedy MRN: [id number] DOB: [birth year]   Care Coordination Outreach Attempts:  An unsuccessful telephone outreach was attempted for a scheduled appointment today.  Follow Up Plan:  Additional outreach attempts will be made to offer the patient care coordination information and services.   Encounter Outcome:  No Answer   Care Coordination Interventions:  No, not indicated    Delsa Sale, RN, BSN, CCM Care Management Coordinator [facility] Care Management  Direct Phone: [phone]

## 2022-07-05 NOTE — Telephone Encounter (Signed)
No respopnse Pv and Colon cancelled

## 2022-07-05 NOTE — Telephone Encounter (Signed)
Patient no show pre visit. Left message to call and reschedule pre visit appointment with nurse by 5pm or colonoscopy would be cancelled.

## 2022-07-06 ENCOUNTER — Telehealth: Payer: Self-pay

## 2022-07-06 NOTE — Telephone Encounter (Signed)
Can you please send to previsit. I am not able to for some reason. Thanks

## 2022-07-06 NOTE — Telephone Encounter (Signed)
Please advise on previous note

## 2022-07-06 NOTE — Telephone Encounter (Signed)
Inbound call from patient spouse in regards to missed pre visit. States they are unaware of apt and would like to reschedule but need further detail. Please advise.  Thank you

## 2022-07-06 NOTE — Telephone Encounter (Signed)
Called twice and left message on voice mail to call schedulers and reschedule pre visit with nurse and colonoscopy

## 2022-07-06 NOTE — Telephone Encounter (Signed)
Shaun Kennedy Please see attached. I attempted to call the patient to see what details were needed. Pts wife said that he would call you back once he got home.

## 2022-07-07 ENCOUNTER — Telehealth: Payer: Self-pay | Admitting: Gastroenterology

## 2022-07-07 NOTE — Telephone Encounter (Signed)
Patient's wife, Corrie Dandy, called this morning.  Patient missed his PV appointment with Augusto Gamble, so his PV and procedure appointments were cancelled.  Corrie Dandy called back to reschedule, but had questions about the procedure and why he needed it.  She said she preferred to speak to a nurse first to get her questions answered before she rescheduled.  Please call patient's wife and advise.  Thank you.

## 2022-07-07 NOTE — Telephone Encounter (Signed)
Pt saw Willette Cluster will send to her nurse

## 2022-07-12 NOTE — Telephone Encounter (Signed)
Called the patient's listed phone number. Voicemail identified as "[name]." Left a message of my return call.

## 2022-07-14 NOTE — Telephone Encounter (Signed)
This pt last saw Willette Cluster will send to her nurse

## 2022-07-14 NOTE — Telephone Encounter (Signed)
I have sent a note to Cathlyn Parsons, CRNA to confirm whether patient may have procedure in [facility] with trach. Will await John's response.

## 2022-07-14 NOTE — Telephone Encounter (Signed)
Patient's daughter called requesting a call back to discuss details of procedure scheduled for 8/14. Please advise, thank you.

## 2022-07-15 NOTE — Telephone Encounter (Signed)
I have spoken to both [place] and Selena Batten (patient's daughters-both on [place]) regarding patient need for enteroscopy for direct visualization of jejunum thickening. They verbalize understanding as to why procedure needs to be completed but did have concerns about him having the procedure as by their recollection, patient began having difficultly breathing following procedure and later required a trach. I have given reassurance that since the patient has a trach currently, there should be no issue maintaining airway for this procedure. Also confirmed that our endoscopy procedure was done 05/2019 and patient later had a bronchoscopy 12/18/19 at which time it is noted he had stridor and required trach. Both Netta and Kim verbalize understanding of this. Winfield Cunas and Corrie Dandy (wife) have all been advised of patients appointment time/date/location and will have patient come for scheduled previsit for specific instructions.

## 2022-07-15 NOTE — Telephone Encounter (Signed)
Shaun Kennedy has scheduled previsit with nurse for 09/05/22 at 200 pm. [name] will arrive at 1:30 pm for the appointment. Additional specific instructions for procedure will be given at that time.

## 2022-07-18 ENCOUNTER — Ambulatory Visit (HOSPITAL_COMMUNITY)
Admission: RE | Admit: 2022-07-18 | Discharge: 2022-07-18 | Disposition: A | Discharge status: Home / Self Care | Payer: Medicare HMO | Source: Ambulatory Visit | Attending: Acute Care | Admitting: Acute Care

## 2022-07-18 ENCOUNTER — Telehealth: Payer: Self-pay | Admitting: Acute Care

## 2022-07-18 DIAGNOSIS — Z923 Personal history of irradiation: Secondary | ICD-10-CM | POA: Diagnosis not present

## 2022-07-18 DIAGNOSIS — J386 Stenosis of larynx: Secondary | ICD-10-CM | POA: Diagnosis not present

## 2022-07-18 DIAGNOSIS — C3490 Malignant neoplasm of unspecified part of unspecified bronchus or lung: Secondary | ICD-10-CM | POA: Insufficient documentation

## 2022-07-18 DIAGNOSIS — Z930 Tracheostomy status: Secondary | ICD-10-CM

## 2022-07-18 DIAGNOSIS — Z430 Encounter for attention to tracheostomy: Secondary | ICD-10-CM | POA: Insufficient documentation

## 2022-07-18 DIAGNOSIS — J383 Other diseases of vocal cords: Secondary | ICD-10-CM | POA: Diagnosis present

## 2022-07-18 NOTE — Progress Notes (Signed)
Tracheostomy Procedure Note  ZAC TORTI [id number] 1940/03/23  Pre Procedure Tracheostomy Information  Trach Brand: Shiley Size:  6.0 [id number] Style: Uncuffed Secured by: Velcro   Procedure: Trach Cleaning and Trach change    Post Procedure Tracheostomy Information  Trach Brand: Shiley Size:  6.0 [id number] Style: Uncuffed Secured by: Velcro   Post Procedure Evaluation:  ETCO2 positive color change from yellow to purple : Yes.   Vital signs:VSS Patients current condition: stable Complications: No apparent complications Trach site exam: clean, dry Wound care done: 4 x 4 gauze drain Patient did tolerate procedure well.   Education: [employer] at home with instruction  Prescription needs: none    Additional needs: New  PMV given to patient at this visit

## 2022-07-18 NOTE — Progress Notes (Signed)
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  I saw him last 9/19. He continues to follow w/ Onc. He has completed palliative XRT 2021, Last CT imaging raised concern for possible mets to bowel he has just completed cycle 20 of 22 for chemo. Presents today for planned trach change on 2/14  Review of Systems  Constitutional:  Negative for chills, fever and malaise/fatigue.  HENT:  Negative for congestion.   Eyes: Negative.   Respiratory:  Negative for cough, sputum production and shortness of breath.   Cardiovascular: Negative.   Gastrointestinal: Negative.   Genitourinary: Negative.   Musculoskeletal: Negative.    Exam  General 82 year old male no distress  HENT # 6 cuffless trach. Excellent phonation with both red cap and PMV Pulm clear  Card rrr Abd soft Ext warm  Neuro intact    Procedure  The # 6 trach was removed. Site inspected. Looks good. New trach site placed over obturator. Site verified via ETCo2. No secretions this time. Janina Mayo is excellent.   Impression/plan  Active Problems:   Tracheostomy status (HCC)   Glottic stenosis   Vocal cord dysfunction   Tracheostomy status (HCC) Overview:  Trach Brand: Shiley Size:  6.0 [id number] Style: Uncuffed Last change 6/10 prior was 5/15 Discussion Stable trach, not a candidate for trach decannulation d/t subglottic stenosis  Plan Cont routine trach care ROV 10 weeks   Glottic stenosis Overview: Looks like last time vocal cords assessed and glottic area assess was back in 2022. The patient is asking if he will ever be a candidate for trach removal. 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  He was again seen in march 2024 by Jenne Pane but looks like fiberoptic evaluation was introduced  thru trach and upper airway/cords etc not evaluated. I suspect there is still pathology there BUT he tolerates red occlusive  cap here in clinic w/out stridor and his vocal quality is pretty good.  Plan I will ask him to see Dr Jenne Pane again for upper airway eval to re-evaluate 1) vocal cord immobility and 2) glottic stenosis. If there are still present then I the answer to the question of if he can ever have trach removed is absolutely not. If this has improved then maybe we can consider it but I would want this to be a call by ENT For now we can also try PRN red capping. Slowly increasing day time red capping as tolerated and remove at HS       My time 20 min  Simonne Martinet ACNP-BC [facility] Pulmonary/Critical Care Pager # [phone] OR # [phone] if no answer

## 2022-07-19 NOTE — Telephone Encounter (Signed)
Will need a referral placed - routing to triage.

## 2022-07-22 NOTE — Telephone Encounter (Signed)
Referral placed for ENT

## 2022-07-26 ENCOUNTER — Other Ambulatory Visit: Payer: Medicare HMO | Admitting: Gastroenterology

## 2022-08-01 ENCOUNTER — Ambulatory Visit: Payer: Self-pay

## 2022-08-01 NOTE — Patient Outreach (Signed)
  Care Coordination   Follow Up Visit Note   08/01/2022 Name: Shaun Kennedy MRN: [id number] DOB: Apr 14, 1940  Shaun Kennedy is a 81 y.o. year old male who sees Ronnald Nian, MD for primary care. I spoke with  Mariah Milling by phone today.  What matters to the patients health and wellness today?  Patient would like to follow up with an ENT for evaluation of decannulation of his tracheostomy.      Goals Addressed             This Visit's Progress    To follow up with ENT       Care Coordination Interventions: Evaluation of current treatment plan related to tracheostomy care and patient's adherence to plan as established by provider Reviewed and discussed with patient his recent visit with the Tracheostomy clinic for routine trach care Noted an ENT referral with Dr. Christia Reading for re-evaluation, 1) vocal cord immobility and 2) glottic stenosis. Has trach X 2 yrs. ? Fiberoptic eval to assess 1 & 2 and determine if ever a decannulation candidate Determined this referral placed on 07/22/22, is a NEW status and has yet to be authorized Sent secure message to [employer] CMA with the trach clinic and requested f/u on the referral status, reply received from [place] advising she will f/u on this referral   Scheduled a 2 week RN CC follow up call with patient to advise of referral status     To have right foot callus resolve without complications       Care Coordination Interventions: Evaluation of current treatment plan related to right foot callus and patient's adherence to plan as established by provider Discussed with patient the callus on his right foot has improved, as evidence by patient reports it is getting smaller in size Determined Dr. Sofie Hartigan has examined the callus and will continue to evaluate every 3 months  Instructed patient to report new symptoms or concerns to Dr. Sofie Hartigan promptly      To keep A1c <7.0 %       Care Coordination Interventions: Advised patient,  providing education and rationale, to check cbg daily before meals and at bedtime and record, calling PCP for findings outside established parameters Review of patient status, including review of consultants reports, relevant laboratory and other test results, and medications completed Lab Results  Component Value Date   HGBA1C 6.6 (A) 04/21/2022       Interventions Today    Flowsheet Row Most Recent Value  Chronic Disease   Chronic disease during today's visit Diabetes, Other  [tracheostomy]  General Interventions   General Interventions Discussed/Reviewed General Interventions Discussed, General Interventions Reviewed, Doctor Visits, Communication with  Doctor Visits Discussed/Reviewed Doctor Visits Reviewed, Doctor Visits Discussed, Specialist  Communication with RN  Emelia Loron CMA]  Education Interventions   Education Provided Provided Education  Provided Verbal Education On Other, Blood Sugar Monitoring  [referral process for ENT referral]          SDOH assessments and interventions completed:  No     Care Coordination Interventions:  Yes, provided   Follow up plan: Follow up call scheduled for 08/15/22 @09 :30 AM    Encounter Outcome:  Pt. Visit Completed

## 2022-08-01 NOTE — Patient Instructions (Signed)
Visit Information  Thank you for taking time to visit with me today. Please don't hesitate to contact me if I can be of assistance to you.   Following are the goals we discussed today:   Goals Addressed             This Visit's Progress    To follow up with ENT       Care Coordination Interventions: Evaluation of current treatment plan related to tracheostomy care and patient's adherence to plan as established by provider Reviewed and discussed with patient his recent visit with the Tracheostomy clinic for routine trach care Noted an ENT referral with Dr. Christia Reading for re-evaluation, 1) vocal cord immobility and 2) glottic stenosis. Has trach X 2 yrs. ? Fiberoptic eval to assess 1 & 2 and determine if ever a decannulation candidate Determined this referral placed on 07/22/22, is a NEW status and has yet to be authorized Sent secure message to [employer] CMA with the trach clinic and requested f/u on the referral status, reply received from [place] advising she will f/u on this referral   Scheduled a 2 week RN CC follow up call with patient to advise of referral status     To have right foot callus resolve without complications       Care Coordination Interventions: Evaluation of current treatment plan related to right foot callus and patient's adherence to plan as established by provider Discussed with patient the callus on his right foot has improved, as evidence by patient reports it is getting smaller in size Determined Dr. Sofie Hartigan has examined the callus and will continue to evaluate every 3 months  Instructed patient to report new symptoms or concerns to Dr. Sofie Hartigan promptly      To keep A1c <7.0 %       Care Coordination Interventions: Advised patient, providing education and rationale, to check cbg daily before meals and at bedtime and record, calling PCP for findings outside established parameters Review of patient status, including review of consultants reports, relevant  laboratory and other test results, and medications completed Lab Results  Component Value Date   HGBA1C 6.6 (A) 04/21/2022           Our next appointment is by telephone on 08/15/22 at 09:30 AM  Please call the care guide team at [phone] if you need to cancel or reschedule your appointment.   If you are experiencing a Mental Health or Behavioral Health Crisis or need someone to talk to, please call 1-800-273-TALK (toll free, 24 hour hotline)  Patient verbalizes understanding of instructions and care plan provided today and agrees to view in MyChart. Active MyChart status and patient understanding of how to access instructions and care plan via MyChart confirmed with patient.     Delsa Sale, RN, BSN, CCM Care Management Coordinator [facility] Care Management Direct Phone: [phone]

## 2022-08-03 ENCOUNTER — Inpatient Hospital Stay: Payer: Medicare HMO | Attending: Internal Medicine

## 2022-08-03 ENCOUNTER — Other Ambulatory Visit: Payer: Self-pay

## 2022-08-03 VITALS — BP 128/55 | HR 50 | Temp 98.9°F | Resp 17

## 2022-08-03 DIAGNOSIS — Z95828 Presence of other vascular implants and grafts: Secondary | ICD-10-CM

## 2022-08-03 DIAGNOSIS — Z452 Encounter for adjustment and management of vascular access device: Secondary | ICD-10-CM | POA: Insufficient documentation

## 2022-08-03 DIAGNOSIS — Z85118 Personal history of other malignant neoplasm of bronchus and lung: Secondary | ICD-10-CM | POA: Diagnosis not present

## 2022-08-03 MED ORDER — SODIUM CHLORIDE 0.9% FLUSH
10.0000 mL | Freq: Once | INTRAVENOUS | Status: AC
  Administered 2022-08-03: 10 mL

## 2022-08-03 MED ORDER — HEPARIN SOD (PORK) LOCK FLUSH 100 UNIT/ML IV SOLN
500.0000 [IU] | Freq: Once | INTRAVENOUS | Status: AC
  Administered 2022-08-03: 500 [IU]

## 2022-08-05 ENCOUNTER — Other Ambulatory Visit: Payer: Self-pay | Admitting: Family Medicine

## 2022-08-15 ENCOUNTER — Ambulatory Visit: Payer: Self-pay

## 2022-08-15 NOTE — Patient Outreach (Signed)
  Care Coordination   Follow Up Visit Note   08/15/2022 Name: Shaun Kennedy MRN: [id number] DOB: May 21, 1940  Shaun Kennedy is a 82 y.o. year old male who sees Ronnald Nian, MD for primary care. I spoke with  Mariah Milling by phone today.  What matters to the patients health and wellness today?  Patient would like to be evaluated for a trach reversal. He will contact the ENT to schedule an in person visit.     Goals Addressed             This Visit's Progress    To follow up with ENT       Care Coordination Interventions: Evaluation of current treatment plan related to tracheostomy care and patient's adherence to plan as established by provider Reviewed and discussed with patient his recent visit with the Tracheostomy clinic for routine trach care Noted an ENT referral with Dr. Christia Reading for re-evaluation, 1) vocal cord immobility and 2) glottic stenosis. Has trach X 2 yrs. ? Fiberoptic eval to assess 1 & 2 and determine if ever a decannulation candidate Determined this referral placed on 07/22/22, and is ready for initial scheduling  Provided patient with the direct contact number and location for Dr. Christia Reading, ENT and instructed him to call for an appointment when ready, patient verbalizes understanding and recorded the information  Reviewed with patient his upcoming scheduled appointments for port flush/labs, in person visit with Dr. Arbutus Ped and preoperative endoscopy visit  Reviewed with patient he will undergo an enteroscopy for evaluation of jejunal thickening on 09/21/22 @11 :00 AM    Interventions Today    Flowsheet Row Most Recent Value  Chronic Disease   Chronic disease during today's visit Other  [tracheostomy]  General Interventions   General Interventions Discussed/Reviewed General Interventions Discussed, General Interventions Reviewed, Doctor Visits  Doctor Visits Discussed/Reviewed Doctor Visits Discussed, Doctor Visits Reviewed, Specialist  Communication  with PCP/Specialists  Education Interventions   Education Provided Provided Education  Provided Verbal Education On When to see the doctor, Labs          SDOH assessments and interventions completed:  No     Care Coordination Interventions:  Yes, provided   Follow up plan: Follow up call scheduled for 09/23/22 @09 :30 AM    Encounter Outcome:  Pt. Visit Completed

## 2022-08-15 NOTE — Patient Instructions (Signed)
Visit Information  Thank you for taking time to visit with me today. Please don't hesitate to contact me if I can be of assistance to you.   Following are the goals we discussed today:   Goals Addressed             This Visit's Progress    To follow up with ENT       Care Coordination Interventions: Evaluation of current treatment plan related to tracheostomy care and patient's adherence to plan as established by provider Reviewed and discussed with patient his recent visit with the Tracheostomy clinic for routine trach care Noted an ENT referral with Dr. Christia Reading for re-evaluation, 1) vocal cord immobility and 2) glottic stenosis. Has trach X 2 yrs. ? Fiberoptic eval to assess 1 & 2 and determine if ever a decannulation candidate Determined this referral placed on 07/22/22, and is ready for initial scheduling  Provided patient with the direct contact number and location for Dr. Christia Reading, ENT and instructed him to call for an appointment when ready, patient verbalizes understanding and recorded the information  Reviewed with patient his upcoming scheduled appointments for port flush/labs, in person visit with Dr. Arbutus Ped and preoperative endoscopy visit  Reviewed with patient he will undergo an enteroscopy for evaluation of jejunal thickening on 09/21/22 @11 :00 AM        Our next appointment is by telephone on 09/23/22 at 09:30 AM  Please call the care guide team at [phone] if you need to cancel or reschedule your appointment.   If you are experiencing a Mental Health or Behavioral Health Crisis or need someone to talk to, please call 1-800-273-TALK (toll free, 24 hour hotline)  Patient verbalizes understanding of instructions and care plan provided today and agrees to view in MyChart. Active MyChart status and patient understanding of how to access instructions and care plan via MyChart confirmed with patient.     Delsa Sale, RN, BSN, CCM Care Management  Coordinator [facility] Care Management Direct Phone: [phone]

## 2022-08-22 ENCOUNTER — Telehealth: Payer: Self-pay | Admitting: Internal Medicine

## 2022-08-22 NOTE — Telephone Encounter (Signed)
 Called patient regarding upcoming appointments, patient is notified. 

## 2022-08-23 ENCOUNTER — Telehealth: Payer: Self-pay | Admitting: *Deleted

## 2022-08-23 NOTE — Telephone Encounter (Signed)
I see your messages from last month, but not seeing if Cathlyn Parsons cleared. Am I missing something?

## 2022-08-24 NOTE — Telephone Encounter (Signed)
So dumb question, but would prep just be an EGD? Never recall doing an enteroscope upstairs before, assuming its same prep?

## 2022-08-24 NOTE — Telephone Encounter (Signed)
Yes ma'am! Endoscopy prep works for small bowel enteroscopy.

## 2022-08-24 NOTE — Telephone Encounter (Signed)
error 

## 2022-08-24 NOTE — Telephone Encounter (Signed)
07/07/22 telephone note shows that J.Nulty cleared patient for [facility] procedure.

## 2022-08-26 ENCOUNTER — Ambulatory Visit (HOSPITAL_COMMUNITY)
Admission: RE | Admit: 2022-08-26 | Discharge: 2022-08-26 | Disposition: A | Discharge status: Home / Self Care | Payer: Medicare HMO | Source: Ambulatory Visit

## 2022-08-26 ENCOUNTER — Telehealth: Payer: Self-pay | Admitting: Acute Care

## 2022-08-26 DIAGNOSIS — Z930 Tracheostomy status: Secondary | ICD-10-CM | POA: Diagnosis not present

## 2022-08-26 DIAGNOSIS — Z430 Encounter for attention to tracheostomy: Secondary | ICD-10-CM | POA: Insufficient documentation

## 2022-08-26 DIAGNOSIS — Z85118 Personal history of other malignant neoplasm of bronchus and lung: Secondary | ICD-10-CM | POA: Diagnosis not present

## 2022-08-26 DIAGNOSIS — C3432 Malignant neoplasm of lower lobe, left bronchus or lung: Secondary | ICD-10-CM | POA: Diagnosis present

## 2022-08-26 DIAGNOSIS — J386 Stenosis of larynx: Secondary | ICD-10-CM | POA: Diagnosis not present

## 2022-08-26 NOTE — Progress Notes (Signed)
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  I saw him last 9/19. He continues to follow w/ Onc. He has completed palliative XRT 2021, Last CT imaging raised concern for possible mets to bowel he completed  22 cycles of chemo in Jan 2024. Now on observation and thriving. Last CT imaging did NOT suggest further disease progression.  Presents today for planned trach change on 6/10.   Review of Systems  Constitutional:  Negative for chills, fever and malaise/fatigue.  HENT:  Negative for congestion.   Eyes: Negative.   Respiratory:  Negative for cough, sputum production and shortness of breath.   Cardiovascular: Negative.   Gastrointestinal: Negative.   Genitourinary: Negative.   Musculoskeletal: Negative.    Exam  General 82 year old male no distress, he ambulated in and out of clinic and tolerated well  HENT # 6 cuffless trach. Stoma is unremarkable. Did have some sig amount of biofilm on trach itself Pulm clear and w/out accessory use. Walks and tolerates activity well Card RRR Abd soft Ext warm dry no edema  Neuro intact w/ no focal def    Procedure  The # 6 trach was removed. Site inspected. Looks good. New trach site placed over obturator. Site verified via ETCo2. No secretions. Biofilm noted as above.   Impression/plan  Active Problems:   Tracheostomy status (HCC)   Malignant neoplasm of lower lobe of left lung (HCC)   Glottic stenosis   Tracheostomy status (HCC) Overview:  Trach Brand: Shiley Size:  6.0 6UN75R Style: Uncuffed Last change 7/19 prior was 6/10 Discussion Stable trach, not a candidate for trach decannulation d/t subglottic stenosis  Plan Cont routine trach care ROV 8 weeks due to increased biofilm on trach during summer time. I expect we can back down to q 12 again in cooler season    Glottic stenosis Overview: Looks like last time  vocal cords assessed and glottic area assess was back in 2022. The patient is asking if he will ever be a candidate for trach removal. 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  He was again seen in march 2024 by Jenne Pane but looks like fiberoptic evaluation was introduced thru trach and upper airway/cords etc not evaluated. I suspect there is still pathology there BUT he tolerates red occlusive  cap here in clinic w/out stridor and his vocal quality is pretty good.  Plan I will ask him to see Dr Jenne Pane again for upper airway eval to re-evaluate 1) vocal cord immobility and 2) glottic stenosis. If there are still present then I the answer to the question of if he can ever have trach removed is absolutely not. If this has improved then maybe we can consider it but I would want this to be a call by ENT For now we can also try PRN red capping. Slowly increasing day time red capping as tolerated and remove at HS   He is still waiting on this I do not see a pending appointment will ask our office to reach out again        My time 15 mnutes Simonne Martinet ACNP-BC [facility] Pulmonary/Critical Care Pager # [phone] OR # [phone] if no answer

## 2022-08-26 NOTE — Progress Notes (Signed)
Tracheostomy Procedure Note  YOVAN LEEMAN [id number] August 11, 1940  Pre Procedure Tracheostomy Information  Trach Brand: Shiley Size: 6.0 6UN 75R Style: Uncuffed Secured by: Velcro   Procedure: Trach Change and Trach Cleaning    Post Procedure Tracheostomy Information  Trach Brand: Shiley Size:  6.0 [id number] Style: Uncuffed Secured by: Velcro   Post Procedure Evaluation:  ETCO2 positive color change from yellow to purple : Yes.   Vital signs:VSS Patients current condition: stable Complications: No apparent complications Trach site exam: clean, dry Wound care done: 4 x 4 gauze drain Patient did tolerate procedure well.   Education: none  Prescription needs: none    Additional needs:  New PMV given at this visit

## 2022-08-27 NOTE — Telephone Encounter (Signed)
Shaun Kennedy I will fix this order and keep an eye on it due to how the nurse put it in we never saw the order so it never went anywhere.

## 2022-08-29 ENCOUNTER — Inpatient Hospital Stay: Payer: Medicare HMO

## 2022-08-31 ENCOUNTER — Other Ambulatory Visit: Payer: Self-pay

## 2022-08-31 ENCOUNTER — Inpatient Hospital Stay: Payer: Medicare HMO

## 2022-08-31 ENCOUNTER — Ambulatory Visit (HOSPITAL_COMMUNITY)
Admission: RE | Admit: 2022-08-31 | Discharge: 2022-08-31 | Disposition: A | Discharge status: Home / Self Care | Payer: Medicare HMO | Source: Ambulatory Visit | Attending: Internal Medicine | Admitting: Internal Medicine

## 2022-08-31 ENCOUNTER — Inpatient Hospital Stay: Payer: Medicare HMO | Attending: Internal Medicine

## 2022-08-31 DIAGNOSIS — K5730 Diverticulosis of large intestine without perforation or abscess without bleeding: Secondary | ICD-10-CM | POA: Diagnosis not present

## 2022-08-31 DIAGNOSIS — Z452 Encounter for adjustment and management of vascular access device: Secondary | ICD-10-CM | POA: Insufficient documentation

## 2022-08-31 DIAGNOSIS — C3490 Malignant neoplasm of unspecified part of unspecified bronchus or lung: Secondary | ICD-10-CM

## 2022-08-31 DIAGNOSIS — I700 Atherosclerosis of aorta: Secondary | ICD-10-CM | POA: Diagnosis not present

## 2022-08-31 DIAGNOSIS — Z85118 Personal history of other malignant neoplasm of bronchus and lung: Secondary | ICD-10-CM | POA: Insufficient documentation

## 2022-08-31 DIAGNOSIS — Z95828 Presence of other vascular implants and grafts: Secondary | ICD-10-CM

## 2022-08-31 LAB — CMP (CANCER CENTER ONLY)
ALT: 11 U/L (ref 0–44)
AST: 15 U/L (ref 15–41)
Albumin: 3.6 g/dL (ref 3.5–5.0)
Alkaline Phosphatase: 50 U/L (ref 38–126)
Anion gap: 6 (ref 5–15)
BUN: 22 mg/dL (ref 8–23)
CO2: 27 mmol/L (ref 22–32)
Calcium: 9 mg/dL (ref 8.9–10.3)
Chloride: 105 mmol/L (ref 98–111)
Creatinine: 1.54 mg/dL — ABNORMAL HIGH (ref 0.61–1.24)
GFR, Estimated: 44 mL/min — ABNORMAL LOW (ref >60)
Glucose, Bld: 97 mg/dL (ref 70–99)
Potassium: 4.3 mmol/L (ref 3.5–5.1)
Sodium: 138 mmol/L (ref 135–145)
Total Bilirubin: 0.4 mg/dL (ref 0.3–1.2)
Total Protein: 7.7 g/dL (ref 6.5–8.1)

## 2022-08-31 LAB — CBC WITH DIFFERENTIAL (CANCER CENTER ONLY)
Abs Immature Granulocytes: 0.01 10*3/uL (ref 0.00–0.07)
Basophils Absolute: 0 10*3/uL (ref 0.0–0.1)
Basophils Relative: 0 %
Eosinophils Absolute: 0.7 10*3/uL — ABNORMAL HIGH (ref 0.0–0.5)
Eosinophils Relative: 12 %
HCT: 30.6 % — ABNORMAL LOW (ref 39.0–52.0)
Hemoglobin: 10.1 g/dL — ABNORMAL LOW (ref 13.0–17.0)
Immature Granulocytes: 0 %
Lymphocytes Relative: 23 %
Lymphs Abs: 1.4 10*3/uL (ref 0.7–4.0)
MCH: 31.2 pg (ref 26.0–34.0)
MCHC: 33 g/dL (ref 30.0–36.0)
MCV: 94.4 fL (ref 80.0–100.0)
Monocytes Absolute: 0.6 10*3/uL (ref 0.1–1.0)
Monocytes Relative: 10 %
Neutro Abs: 3.3 10*3/uL (ref 1.7–7.7)
Neutrophils Relative %: 55 %
Platelet Count: 351 10*3/uL (ref 150–400)
RBC: 3.24 MIL/uL — ABNORMAL LOW (ref 4.22–5.81)
RDW: 11.9 % (ref 11.5–15.5)
WBC Count: 6 10*3/uL (ref 4.0–10.5)
nRBC: 0 % (ref 0.0–0.2)

## 2022-08-31 MED ORDER — IOHEXOL 300 MG/ML  SOLN
80.0000 mL | Freq: Once | INTRAMUSCULAR | Status: AC | PRN
  Administered 2022-08-31: 80 mL via INTRAVENOUS

## 2022-08-31 MED ORDER — SODIUM CHLORIDE (PF) 0.9 % IJ SOLN
INTRAMUSCULAR | Status: AC
  Filled 2022-08-31: qty 50

## 2022-08-31 MED ORDER — HEPARIN SOD (PORK) LOCK FLUSH 100 UNIT/ML IV SOLN: 500.0000 [IU] | Freq: Once | INTRAVENOUS | Status: DC

## 2022-08-31 MED ORDER — SODIUM CHLORIDE 0.9% FLUSH
10.0000 mL | Freq: Once | INTRAVENOUS | Status: AC
  Administered 2022-08-31: 10 mL

## 2022-08-31 NOTE — Telephone Encounter (Signed)
Noted  

## 2022-09-01 ENCOUNTER — Inpatient Hospital Stay: Payer: Medicare HMO | Admitting: Internal Medicine

## 2022-09-01 ENCOUNTER — Inpatient Hospital Stay: Payer: Medicare HMO

## 2022-09-01 DIAGNOSIS — Z452 Encounter for adjustment and management of vascular access device: Secondary | ICD-10-CM | POA: Diagnosis not present

## 2022-09-01 DIAGNOSIS — Z85118 Personal history of other malignant neoplasm of bronchus and lung: Secondary | ICD-10-CM | POA: Diagnosis not present

## 2022-09-01 DIAGNOSIS — Z95828 Presence of other vascular implants and grafts: Secondary | ICD-10-CM

## 2022-09-01 MED ORDER — HEPARIN SOD (PORK) LOCK FLUSH 100 UNIT/ML IV SOLN
500.0000 [IU] | Freq: Once | INTRAVENOUS | Status: AC
  Administered 2022-09-01: 500 [IU]

## 2022-09-01 MED ORDER — SODIUM CHLORIDE 0.9% FLUSH
10.0000 mL | Freq: Once | INTRAVENOUS | Status: AC
  Administered 2022-09-01: 10 mL

## 2022-09-01 NOTE — Progress Notes (Signed)
Pt. Came in today to have port de accessed.  Came in on yesterday to have port accessed for CT Scan.  Port flushed well, but no blood return noted.  Pt. Sent to lab for peripheral lab draw.  He was informed to go to CT after his labs were drawn.  Also, informed to have CT to deaccess his port afterwards.  Pt. States he came back today because CT didn't deaccess his port and he forgot to remind them, therefore he went home with the needle still intact.  Port was flushed with normal saline, still no blood return noted.  Flushed with heparin and needle removed from port as noted

## 2022-09-02 ENCOUNTER — Encounter: Payer: Self-pay | Admitting: Internal Medicine

## 2022-09-05 ENCOUNTER — Ambulatory Visit (AMBULATORY_SURGERY_CENTER): Payer: Medicare HMO

## 2022-09-05 VITALS — Ht 67.5 in | Wt 192.0 lb

## 2022-09-05 DIAGNOSIS — R933 Abnormal findings on diagnostic imaging of other parts of digestive tract: Secondary | ICD-10-CM

## 2022-09-05 NOTE — Progress Notes (Deleted)
[facility] OFFICE PROGRESS NOTE  Ronnald Nian, MD [address] [place] [address]  DIAGNOSIS: Stage IV (T3, N2, M1 a) non-small cell lung cancer, squamous cell carcinoma presented with obstructive left lower lobe lung mass in addition to mediastinal lymphadenopathy as well as bilateral pulmonary nodules diagnosed in July 2021.   PDL1: 0%  PRIOR THERAPY: 1) Palliative radiotherapy to the obstructive left lower lobe lung mass under the care of Dr. Mitzi Hansen.  2)  systemic chemotherapy 2 cycles of chemotherapy with carboplatin for an AUC of 5 and paclitaxel 175 mg/m2 in addition to immunotherapy with nivolumab 360 mg every 3 weeks and ipilimumab 1 mg/kg IV every 6 weeks followed by maintenance nivolumab and ipilimumab.  He started the first treatment on 09/04/2019.  He is status post 22 cycles.  Last was giving on February 16, 2022.  This treatment was discontinued after the patient completed more than 2 years on the immunotherapy.  CURRENT THERAPY: Observation   INTERVAL HISTORY: Shaun Kennedy 82 y.o. male returns to the clinic for 1-month follow-up visit.  The patient was last seen by Dr. Arbutus Ped on 06/01/22.  The patient is currently on observation and feeling well.  Today he denies any fever, chills, night sweats, or unexplained weight loss.  Denies any nausea, vomiting, diarrhea, or constipation.  Denies any chest pain, shortness of breath, cough, or hemoptysis.  Denies any headache or visual changes.  Denies any rashes or skin changes.  He recently had a restaging CT scan performed.  He is here today for evaluation and to review his scan results.      MEDICAL HISTORY: Past Medical History:  Diagnosis Date   Allergic rhinitis    Asthma    Carotid stenosis    Colon cancer (HCC) 2003   Diabetes mellitus (HCC)    Diverticulosis    Dyslipidemia    ED (erectile dysfunction)    GERD (gastroesophageal reflux disease)    H/O degenerative disc disease     Hemorrhoids    HTN (hypertension)    as a child   Hyperlipidemia    Lung cancer (HCC)    LVH (left ventricular hypertrophy)    on EKG   Mass of lower lobe of left lung    Mediastinal adenopathy    Smoker    former   Wears dentures    Wears dentures    Wears glasses    Wears glasses     ALLERGIES:  has No Known Allergies.  MEDICATIONS:  Current Outpatient Medications  Medication Sig Dispense Refill   Alcohol Swabs (DROPSAFE ALCOHOL PREP) 70 % PADS Apply topically.     amLODipine (NORVASC) 5 MG tablet Take 1 tablet (5 mg total) by mouth daily. 90 tablet 3   aspirin EC 81 MG tablet Take 81 mg by mouth daily after breakfast.      bisacodyl (DULCOLAX) 5 MG EC tablet Take 10 mg by mouth daily as needed for moderate constipation.     Blood Glucose Calibration (TRUE METRIX LEVEL 1) Low SOLN      Blood Glucose Monitoring Suppl (TRUE METRIX AIR GLUCOSE METER) w/Device KIT USE AS DIRECTED 1 kit 3   cholecalciferol (VITAMIN D) 25 MCG (1000 UNIT) tablet Take 1,000 Units by mouth daily.     donepezil (ARICEPT) 10 MG tablet Take 1 tablet (10 mg total) by mouth at bedtime. 90 tablet 0   ferrous sulfate 325 (65 FE) MG EC tablet TAKE 1 TABLET (325 MG TOTAL) DAILY  WITH BREAKFAST. 90 tablet 1   gabapentin (NEURONTIN) 100 MG capsule Take by mouth.     hydrOXYzine (ATARAX) 10 MG tablet Take 1 tablet (10 mg total) by mouth 3 (three) times daily as needed. 90 tablet 1   Lancet Devices (TRUEDRAW LANCING DEVICE) MISC Check sugars daily 90 each 0   lisinopril-hydrochlorothiazide (ZESTORETIC) 20-12.5 MG tablet Take 1 tablet by mouth daily. 90 tablet 3   metFORMIN (GLUCOPHAGE-XR) 500 MG 24 hr tablet Take 1 tablet (500 mg total) by mouth daily. 90 tablet 1   Multiple Vitamin (MULTIVITAMIN WITH MINERALS) TABS tablet Take 1 tablet by mouth daily after breakfast.      simvastatin (ZOCOR) 40 MG tablet TAKE 1 TABLET EVERY DAY 90 tablet 1   solifenacin (VESICARE) 5 MG tablet TAKE 1 TABLET EVERY DAY 90 tablet 1    TRUEplus Lancets 33G MISC 1 each by Does not apply route 2 (two) times daily. (Patient not taking: Reported on 09/05/2022) 200 each 4   No current facility-administered medications for this visit.    SURGICAL HISTORY:  Past Surgical History:  Procedure Laterality Date   BRONCHIAL BIOPSY  08/20/2019   Procedure: BRONCHIAL BIOPSIES;  Surgeon: Leslye Peer, MD;  Location: [facility] ENDOSCOPY;  Service: Pulmonary;;   BRONCHIAL BRUSHINGS  08/20/2019   Procedure: BRONCHIAL BRUSHINGS;  Surgeon: Leslye Peer, MD;  Location: [facility] ENDOSCOPY;  Service: Pulmonary;;   BRONCHIAL NEEDLE ASPIRATION BIOPSY  08/20/2019   Procedure: BRONCHIAL NEEDLE ASPIRATION BIOPSIES;  Surgeon: Leslye Peer, MD;  Location[name] ENDOSCOPY;  Service: Pulmonary;;   CARPAL TUNNEL RELEASE Right 01/09/2019   Procedure: CARPAL TUNNEL RELEASE;  Surgeon: Tarry Kos, MD;  Location: [facility];  Service: Orthopedics;  Laterality: Right;   CARPAL TUNNEL WITH CUBITAL TUNNEL Right 11/18/2020   Procedure: CARPAL TUNNEL WITH CUBITAL TUNNEL;  Surgeon: Dairl Ponder, MD;  Location: [facility] OR;  Service: Orthopedics;  Laterality: Right;   CATARACT EXTRACTION Right 2018   COLONOSCOPY  2007   Dr. Elnoria Maxi   DIRECT LARYNGOSCOPY N/A 04/10/2020   Procedure: DIRECT LARYNGOSCOPY;  Surgeon: Christia Reading, MD;  Location: [facility] OR;  Service: ENT;  Laterality: N/A;   I & D EXTREMITY Right 04/02/2016   Procedure: IRRIGATION AND DEBRIDEMENT GREAT TOE;  Surgeon: Tarry Kos, MD;  Location: [facility] OR;  Service: Orthopedics;  Laterality: Right;   IR IMAGING GUIDED PORT INSERTION  08/30/2019   MULTIPLE TOOTH EXTRACTIONS     RADIOACTIVE SEED IMPLANT  2003   TRACHEOSTOMY TUBE PLACEMENT N/A 04/10/2020   Procedure: TRACHEOSTOMY;  Surgeon: Christia Reading, MD;  [facility] OR;  Service: ENT;  Laterality: N/A;   ULNAR TUNNEL RELEASE Right 01/09/2019   Procedure: RIGHT CUBITAL TUNNEL RELEASE AND CARPAL TUNNEL RELEASE;  Surgeon: Tarry Kos, MD;  Location: [facility];  Service: Orthopedics;  Laterality: Right;   UPPER GASTROINTESTINAL ENDOSCOPY     VIDEO BRONCHOSCOPY N/A 12/18/2019   Procedure: VIDEO BRONCHOSCOPY WITHOUT FLUORO;  Surgeon: Leslye Peer, MD;  Location: [facility] ENDOSCOPY;  Service: Cardiopulmonary;  Laterality: N/A;   VIDEO BRONCHOSCOPY WITH ENDOBRONCHIAL ULTRASOUND N/A 08/20/2019   Procedure: VIDEO BRONCHOSCOPY WITH ENDOBRONCHIAL ULTRASOUND;  Surgeon: Leslye Peer, MD;  Location: [facility] ENDOSCOPY;  Service: Pulmonary;  Laterality: N/A;    REVIEW OF SYSTEMS:   Review of Systems  Constitutional: Negative for appetite change, chills, fatigue, fever and unexpected weight change.  HENT:   Negative for mouth sores, nosebleeds, sore throat and trouble swallowing.   Eyes: Negative  for eye problems and icterus.  Respiratory: Negative for cough, hemoptysis, shortness of breath and wheezing.   Cardiovascular: Negative for chest pain and leg swelling.  Gastrointestinal: Negative for abdominal pain, constipation, diarrhea, nausea and vomiting.  Genitourinary: Negative for bladder incontinence, difficulty urinating, dysuria, frequency and hematuria.   Musculoskeletal: Negative for back pain, gait problem, neck pain and neck stiffness.  Skin: Negative for itching and rash.  Neurological: Negative for dizziness, extremity weakness, gait problem, headaches, light-headedness and seizures.  Hematological: Negative for adenopathy. Does not bruise/bleed easily.  Psychiatric/Behavioral: Negative for confusion, depression and sleep disturbance. The patient is not nervous/anxious.     PHYSICAL EXAMINATION:  There were no vitals taken for this visit.  ECOG PERFORMANCE STATUS: {CHL ONC ECOG [id number]  Physical Exam  Constitutional: Oriented to person, place, and time and well-developed, well-nourished, and in no distress. No distress.  HENT:  Head: Normocephalic and atraumatic.  Mouth/Throat: Oropharynx is clear and moist. No oropharyngeal  exudate.  Eyes: Conjunctivae are normal. Right eye exhibits no discharge. Left eye exhibits no discharge. No scleral icterus.  Neck: Normal range of motion. Neck supple.  Cardiovascular: Normal rate, regular rhythm, normal heart sounds and intact distal pulses.   Pulmonary/Chest: Effort normal and breath sounds normal. No respiratory distress. No wheezes. No rales.  Abdominal: Soft. Bowel sounds are normal. Exhibits no distension and no mass. There is no tenderness.  Musculoskeletal: Normal range of motion. Exhibits no edema.  Lymphadenopathy:    No cervical adenopathy.  Neurological: Alert and oriented to person, place, and time. Exhibits normal muscle tone. Gait normal. Coordination normal.  Skin: Skin is warm and dry. No rash noted. Not diaphoretic. No erythema. No pallor.  Psychiatric: Mood, memory and judgment normal.  Vitals reviewed.  LABORATORY DATA: Lab Results  Component Value Date   WBC 6.0 08/31/2022   HGB 10.1 (L) 08/31/2022   HCT 30.6 (L) 08/31/2022   MCV 94.4 08/31/2022   PLT 351 08/31/2022      Chemistry      Component Value Date/Time   NA 138 08/31/2022 1549   NA 146 (H) 10/04/2021 1616   K 4.3 08/31/2022 1549   CL 105 08/31/2022 1549   CO2 27 08/31/2022 1549   BUN 22 08/31/2022 1549   BUN 12 10/04/2021 1616   CREATININE 1.54 (H) 08/31/2022 1549   CREATININE 1.25 (H) 08/08/2016 0912      Component Value Date/Time   CALCIUM 9.0 08/31/2022 1549   ALKPHOS 50 08/31/2022 1549   AST 15 08/31/2022 1549   ALT 11 08/31/2022 1549   BILITOT 0.4 08/31/2022 1549       RADIOGRAPHIC STUDIES:  No results found.   ASSESSMENT/PLAN:  This is a very pleasant 82 year old [name] diagnosed with stage IV (T3, N2, M1a) non-small cell lung cancer, squamous cell carcinoma.  He presented with a left lower lobe obstructive lung mass in addition to mediastinal lymphadenopathy.  He also presented with bilateral pulmonary nodules.  He was diagnosed in July 2021.   His PD-L1 expression is negative.   He completed palliative radiotherapy to the obstructive lung mass under the care of Dr. Mitzi Hansen in August 2021.  He completed 2 years of treatment with systemic chemotherapy with carboplatin and paclitaxel for 2 cycles in addition to immunotherapy with ipilimumab 1 mg/KG every 6 weeks and nivolumab 360 mg IV every 3 weeks.  He is status post 22 cycles.   She is currently on observation and feeling well.  The patient was seen with  Dr. Arbutus Ped today.  Dr. Arbutus Ped personally independently reviewed the scan and discussed with patient today.  The scan showed ***  Mohamed recommends that he continue on observation with a restaging scan in ***months.  Flush?  The patient was advised to call immediately if he has any concerning symptoms in the interval. The patient voices understanding of current disease status and treatment options and is in agreement with the current care plan. All questions were answered. The patient knows to call the clinic with any problems, questions or concerns. We can certainly see the patient much sooner if necessary     No orders of the defined types were placed in this encounter.    I spent {CHL ONC TIME VISIT - WUJWJ:[id number]} counseling the patient face to face. The total time spent in the appointment was {CHL ONC TIME VISIT - OZHYQ:[id number]}.   L , PA-C 09/05/22

## 2022-09-05 NOTE — Progress Notes (Signed)

## 2022-09-06 ENCOUNTER — Telehealth: Payer: Self-pay | Admitting: Internal Medicine

## 2022-09-06 NOTE — Telephone Encounter (Signed)
Called patient regarding 08/01 appointments, left a voicemail.

## 2022-09-08 ENCOUNTER — Inpatient Hospital Stay: Payer: Medicare HMO | Admitting: Physician Assistant

## 2022-09-08 NOTE — Progress Notes (Signed)
[facility] OFFICE PROGRESS NOTE  Ronnald Nian, MD [address] [place] [address]  DIAGNOSIS: Stage IV (T3, N2, M1 a) non-small cell lung cancer, squamous cell carcinoma presented with obstructive left lower lobe lung mass in addition to mediastinal lymphadenopathy as well as bilateral pulmonary nodules diagnosed in July 2021.   PDL1: 0%  PRIOR THERAPY: 1) Palliative radiotherapy to the obstructive left lower lobe lung mass under the care of Dr. Mitzi Hansen.  2)  systemic chemotherapy 2 cycles of chemotherapy with carboplatin for an AUC of 5 and paclitaxel 175 mg/m2 in addition to immunotherapy with nivolumab 360 mg every 3 weeks and ipilimumab 1 mg/kg IV every 6 weeks followed by maintenance nivolumab and ipilimumab.  He started the first treatment on 09/04/2019.  He is status post 22 cycles.  Last was giving on February 16, 2022.  This treatment was discontinued after the patient completed more than 2 years on the immunotherapy.  CURRENT THERAPY: Observation   INTERVAL HISTORY: Shaun Kennedy 82 y.o. male returns to the clinic for a 66-month follow-up visit.  The patient was last seen by Dr. Arbutus Ped on 06/01/22.  The patient is currently on observation and feeling well. He states he has been golfing a lot in his spare time.  Today, he denies any fever, chills, or night sweats. He reports he is not as hungry as he used to be. He has been trying to drink ensure x1 per day. He states he drinks 6 glasses of water daily. Denies any nausea, vomiting, diarrhea, or constipation.  Denies any chest pain, shortness of breath, cough, or hemoptysis.  Denies any headache or visual changes except for dry eyes.  He denies any lightheadedness. He did not take his antihypertensives this morning. He has a BP cuff at home. He thinks he takes lisinopril-hydrochlorothiazide for BP. He does not think he takes norvasc anymore. Denies any rashes or skin changes.  He recently had a restaging CT scan  performed.  He is here today for evaluation and to review his scan results.     MEDICAL HISTORY: Past Medical History:  Diagnosis Date   Allergic rhinitis    Asthma    Carotid stenosis    Colon cancer (HCC) 2003   Diabetes mellitus (HCC)    Diverticulosis    Dyslipidemia    ED (erectile dysfunction)    GERD (gastroesophageal reflux disease)    H/O degenerative disc disease    Hemorrhoids    HTN (hypertension)    as a child   Hyperlipidemia    Lung cancer (HCC)    LVH (left ventricular hypertrophy)    on EKG   Mass of lower lobe of left lung    Mediastinal adenopathy    Smoker    former   Wears dentures    Wears dentures    Wears glasses    Wears glasses     ALLERGIES:  has No Known Allergies.  MEDICATIONS:  Current Outpatient Medications  Medication Sig Dispense Refill   Alcohol Swabs (DROPSAFE ALCOHOL PREP) 70 % PADS Apply topically.     amLODipine (NORVASC) 5 MG tablet Take 1 tablet (5 mg total) by mouth daily. 90 tablet 3   aspirin EC 81 MG tablet Take 81 mg by mouth daily after breakfast.      bisacodyl (DULCOLAX) 5 MG EC tablet Take 10 mg by mouth daily as needed for moderate constipation.     Blood Glucose Calibration (TRUE METRIX LEVEL 1) Low SOLN  Blood Glucose Monitoring Suppl (TRUE METRIX AIR GLUCOSE METER) w/Device KIT USE AS DIRECTED 1 kit 3   cholecalciferol (VITAMIN D) 25 MCG (1000 UNIT) tablet Take 1,000 Units by mouth daily.     donepezil (ARICEPT) 10 MG tablet Take 1 tablet (10 mg total) by mouth at bedtime. 90 tablet 0   ferrous sulfate 325 (65 FE) MG EC tablet TAKE 1 TABLET (325 MG TOTAL) DAILY WITH BREAKFAST. 90 tablet 1   gabapentin (NEURONTIN) 100 MG capsule Take by mouth.     hydrOXYzine (ATARAX) 10 MG tablet Take 1 tablet (10 mg total) by mouth 3 (three) times daily as needed. 90 tablet 1   Lancet Devices (TRUEDRAW LANCING DEVICE) MISC Check sugars daily 90 each 0   lisinopril-hydrochlorothiazide (ZESTORETIC) 20-12.5 MG tablet Take 1  tablet by mouth daily. 90 tablet 3   metFORMIN (GLUCOPHAGE-XR) 500 MG 24 hr tablet Take 1 tablet (500 mg total) by mouth daily. 90 tablet 1   Multiple Vitamin (MULTIVITAMIN WITH MINERALS) TABS tablet Take 1 tablet by mouth daily after breakfast.      simvastatin (ZOCOR) 40 MG tablet TAKE 1 TABLET EVERY DAY 90 tablet 1   solifenacin (VESICARE) 5 MG tablet TAKE 1 TABLET EVERY DAY 90 tablet 1   TRUEplus Lancets 33G MISC 1 each by Does not apply route 2 (two) times daily. (Patient not taking: Reported on 09/05/2022) 200 each 4   No current facility-administered medications for this visit.    SURGICAL HISTORY:  Past Surgical History:  Procedure Laterality Date   BRONCHIAL BIOPSY  08/20/2019   Procedure: BRONCHIAL BIOPSIES;  Surgeon: Leslye Peer, MD;  Location: [facility] ENDOSCOPY;  Service: Pulmonary;;   BRONCHIAL BRUSHINGS  08/20/2019   Procedure: BRONCHIAL BRUSHINGS;  Surgeon: Leslye Peer, MD;  Location: [facility] ENDOSCOPY;  Service: Pulmonary;;   BRONCHIAL NEEDLE ASPIRATION BIOPSY  08/20/2019   Procedure: BRONCHIAL NEEDLE ASPIRATION BIOPSIES;  Surgeon: Leslye Peer, MD;  Location: [facility] ENDOSCOPY;  Service: Pulmonary;;   CARPAL TUNNEL RELEASE Right 01/09/2019   Procedure: CARPAL TUNNEL RELEASE;  Surgeon: Tarry Kos, MD;  Location: [facility];  Service: Orthopedics;  Laterality: Right;   CARPAL TUNNEL WITH CUBITAL TUNNEL Right 11/18/2020   Procedure: CARPAL TUNNEL WITH CUBITAL TUNNEL;  Surgeon: Dairl Ponder, MD;  Location: [facility] OR;  Service: Orthopedics;  Laterality: Right;   CATARACT EXTRACTION Right 2018   COLONOSCOPY  2007   Dr. Elnoria Hayze   DIRECT LARYNGOSCOPY N/A 04/10/2020   Procedure: DIRECT LARYNGOSCOPY;  Surgeon: Christia Reading, MD;  Location: [facility] OR;  Service: ENT;  Laterality: N/A;   I & D EXTREMITY Right 04/02/2016   Procedure: IRRIGATION AND DEBRIDEMENT GREAT TOE;  Surgeon: Tarry Kos, MD;  Location: [facility] OR;  Service: Orthopedics;  Laterality: Right;   IR IMAGING GUIDED PORT  INSERTION  08/30/2019   MULTIPLE TOOTH EXTRACTIONS     RADIOACTIVE SEED IMPLANT  2003   TRACHEOSTOMY TUBE PLACEMENT N/A 04/10/2020   Procedure: TRACHEOSTOMY;  Surgeon: Christia Reading, MD;  Location: [facility] OR;  Service: ENT;  Laterality: N/A;   ULNAR TUNNEL RELEASE Right 01/09/2019   Procedure: RIGHT CUBITAL TUNNEL RELEASE AND CARPAL TUNNEL RELEASE;  Surgeon: Tarry Kos, MD;  Location: [facility];  Service: Orthopedics;  Laterality: Right;   UPPER GASTROINTESTINAL ENDOSCOPY     VIDEO BRONCHOSCOPY N/A 12/18/2019   Procedure: VIDEO BRONCHOSCOPY WITHOUT FLUORO;  Surgeon: Leslye Peer, MD;  Location: [facility] ENDOSCOPY;  Service: Cardiopulmonary;  Laterality: N/A;   VIDEO  BRONCHOSCOPY WITH ENDOBRONCHIAL ULTRASOUND N/A 08/20/2019   Procedure: VIDEO BRONCHOSCOPY WITH ENDOBRONCHIAL ULTRASOUND;  Surgeon: Leslye Peer, MD;  Location: [facility] ENDOSCOPY;  Service: Pulmonary;  Laterality: N/A;    REVIEW OF SYSTEMS:   Review of Systems  Constitutional: Positive for decreased appetite and weight loss. Negative for chills, fatigue, and fever.  HENT: Negative for mouth sores, nosebleeds, sore throat and trouble swallowing.  Eyes: Positive for dry eyes. Negative for eye problems and icterus.  Respiratory: Positive for occasional cough depending on mucus blocking his trach. Positive for mild dyspnea with certain activities. Negative for hemoptysis and wheezing.   Cardiovascular: Negative for chest pain and leg swelling.  Gastrointestinal: Negative for abdominal pain, constipation, diarrhea, nausea and vomiting.  Genitourinary: Negative for bladder incontinence, difficulty urinating, dysuria, frequency and hematuria.   Musculoskeletal: Negative for back pain, gait problem, neck pain and neck stiffness.  Skin: Negative for itching and rash.  Neurological: Negative for dizziness, extremity weakness, gait problem, headaches, light-headedness and seizures.  Hematological: Negative for adenopathy. Does not  bruise/bleed easily.  Psychiatric/Behavioral: Negative for confusion, depression and sleep disturbance. The patient is not nervous/anxious.   PHYSICAL EXAMINATION:  Blood pressure (!) 90/49, pulse 64, temperature 98.4 F (36.9 C), temperature source Oral, resp. rate 17, weight 187 lb (84.8 kg), SpO2 96%. BP manually rechecked at 98/56  ECOG PERFORMANCE STATUS: 1  Physical Exam  Constitutional: Oriented to person, place, and time and well-developed, well-nourished, and in no distress. No distress.  HENT:  Head: Normocephalic and atraumatic.  Mouth/Throat: Trach in place.  Eyes: Conjunctivae are normal. Right eye exhibits no discharge. Left eye exhibits no discharge. No scleral icterus.  Neck: Normal range of motion. Neck supple.  Cardiovascular: Normal rate, regular rhythm, normal heart sounds and intact distal pulses.   Pulmonary/Chest: Effort normal and breath sounds normal. No respiratory distress. No wheezes. No rales. Positive for rhonchi.  Abdominal: Soft.  Exhibits no distension.There is no tenderness.  Musculoskeletal: Normal range of motion.  Lymphadenopathy:    No cervical adenopathy.  Neurological: Alert and oriented to person, place, and time. Exhibits normal muscle tone. Gait normal. Coordination normal.  Skin: Skin is warm and dry. No rash noted. Not diaphoretic. No erythema. No pallor.  Psychiatric: Mood, memory and judgment normal.  Vitals reviewed.  LABORATORY DATA: Lab Results  Component Value Date   WBC 6.0 08/31/2022   HGB 10.1 (L) 08/31/2022   HCT 30.6 (L) 08/31/2022   MCV 94.4 08/31/2022   PLT 351 08/31/2022      Chemistry      Component Value Date/Time   NA 138 08/31/2022 1549   NA 146 (H) 10/04/2021 1616   K 4.3 08/31/2022 1549   CL 105 08/31/2022 1549   CO2 27 08/31/2022 1549   BUN 22 08/31/2022 1549   BUN 12 10/04/2021 1616   CREATININE 1.54 (H) 08/31/2022 1549   CREATININE 1.25 (H) 08/08/2016 0912      Component Value Date/Time   CALCIUM  9.0 08/31/2022 1549   ALKPHOS 50 08/31/2022 1549   AST 15 08/31/2022 1549   ALT 11 08/31/2022 1549   BILITOT 0.4 08/31/2022 1549       RADIOGRAPHIC STUDIES:  CT Chest W Contrast  Result Date: 09/07/2022 CLINICAL DATA:  Non-small-cell lung cancer. Restaging. * Tracking Code: BO * EXAM: CT CHEST, ABDOMEN, AND PELVIS WITH CONTRAST TECHNIQUE: Multidetector CT imaging of the chest, abdomen and pelvis was performed following the standard protocol during bolus administration of intravenous contrast. RADIATION DOSE REDUCTION:  This exam was performed according to the departmental dose-optimization program which includes automated exposure control, adjustment of the mA and/or kV according to patient size and/or use of iterative reconstruction technique. CONTRAST:  80mL OMNIPAQUE IOHEXOL 300 MG/ML  SOLN COMPARISON:  05/17/2022 FINDINGS: CT CHEST FINDINGS Cardiovascular: The heart size is normal. No substantial pericardial effusion. Coronary artery calcification is evident. Moderate atherosclerotic calcification is noted in the wall of the thoracic aorta. Right Port-A-Cath tip is positioned in the mid SVC. Mediastinum/Nodes: Tracheostomy tube evident. No mediastinal lymphadenopathy. There is no hilar lymphadenopathy. The esophagus has normal imaging features. There is no axillary lymphadenopathy. Lungs/Pleura: Radiation fibrosis identified in the medial and parahilar left lung with associated volume loss in left hemithorax. 7 mm pulmonary nodule in the periphery of the left lower lobe on 87/509 is stable in the interval. 4 mm nodule in the superior segment right lower lobe is unchanged and may be a calcified granuloma. Stable 5 mm right middle lobe nodule on 81/509. No substantial pleural effusion. Musculoskeletal: No worrisome lytic or sclerotic osseous abnormality. CT ABDOMEN PELVIS FINDINGS Hepatobiliary: No suspicious focal abnormality within the liver parenchyma. There is no evidence for gallstones,  gallbladder wall thickening, or pericholecystic fluid. No intrahepatic or extrahepatic biliary dilation. Pancreas: No focal mass lesion. No dilatation of the main duct. No intraparenchymal cyst. No peripancreatic edema. Probable pancreas divisum. Spleen: No splenomegaly. No suspicious focal mass lesion. Adrenals/Urinary Tract: No adrenal nodule or mass. Kidneys unremarkable. No evidence for hydroureter. The urinary bladder appears normal for the degree of distention. Stomach/Bowel: Stomach is unremarkable. No gastric wall thickening. No evidence of outlet obstruction. Duodenum is normally positioned as is the ligament of Treitz. No small bowel wall thickening. No small bowel dilatation. No gross colonic mass. No colonic wall thickening. Diverticular changes are noted in the left colon without evidence of diverticulitis. Vascular/Lymphatic: There is moderate atherosclerotic calcification of the abdominal aorta without aneurysm. There is no gastrohepatic or hepatoduodenal ligament lymphadenopathy. No retroperitoneal or mesenteric lymphadenopathy. No pelvic sidewall lymphadenopathy. Reproductive: Brachytherapy are seen in the prostate gland. Other: No intraperitoneal free fluid. Musculoskeletal: No worrisome lytic or sclerotic osseous abnormality. IMPRESSION: 1. Stable exam. No new or progressive findings to suggest recurrent or metastatic disease in the chest, abdomen, or pelvis. 2. Stable radiation fibrosis in the medial and parahilar left lung with associated volume loss in left hemithorax. 3. Stable bilateral pulmonary nodules. 4. Left colonic diverticulosis without diverticulitis. 5.  Aortic Atherosclerosis (ICD10-I70.0). Electronically Signed   By: Kennith Center M.D.   On: 09/07/2022 08:54   CT Abdomen Pelvis W Contrast  Result Date: 09/07/2022 CLINICAL DATA:  Non-small-cell lung cancer. Restaging. * Tracking Code: BO * EXAM: CT CHEST, ABDOMEN, AND PELVIS WITH CONTRAST TECHNIQUE: Multidetector CT imaging of  the chest, abdomen and pelvis was performed following the standard protocol during bolus administration of intravenous contrast. RADIATION DOSE REDUCTION: This exam was performed according to the departmental dose-optimization program which includes automated exposure control, adjustment of the mA and/or kV according to patient size and/or use of iterative reconstruction technique. CONTRAST:  80mL OMNIPAQUE IOHEXOL 300 MG/ML  SOLN COMPARISON:  05/17/2022 FINDINGS: CT CHEST FINDINGS Cardiovascular: The heart size is normal. No substantial pericardial effusion. Coronary artery calcification is evident. Moderate atherosclerotic calcification is noted in the wall of the thoracic aorta. Right Port-A-Cath tip is positioned in the mid SVC. Mediastinum/Nodes: Tracheostomy tube evident. No mediastinal lymphadenopathy. There is no hilar lymphadenopathy. The esophagus has normal imaging features. There is no axillary lymphadenopathy. Lungs/Pleura:  Radiation fibrosis identified in the medial and parahilar left lung with associated volume loss in left hemithorax. 7 mm pulmonary nodule in the periphery of the left lower lobe on 87/509 is stable in the interval. 4 mm nodule in the superior segment right lower lobe is unchanged and may be a calcified granuloma. Stable 5 mm right middle lobe nodule on 81/509. No substantial pleural effusion. Musculoskeletal: No worrisome lytic or sclerotic osseous abnormality. CT ABDOMEN PELVIS FINDINGS Hepatobiliary: No suspicious focal abnormality within the liver parenchyma. There is no evidence for gallstones, gallbladder wall thickening, or pericholecystic fluid. No intrahepatic or extrahepatic biliary dilation. Pancreas: No focal mass lesion. No dilatation of the main duct. No intraparenchymal cyst. No peripancreatic edema. Probable pancreas divisum. Spleen: No splenomegaly. No suspicious focal mass lesion. Adrenals/Urinary Tract: No adrenal nodule or mass. Kidneys unremarkable. No evidence  for hydroureter. The urinary bladder appears normal for the degree of distention. Stomach/Bowel: Stomach is unremarkable. No gastric wall thickening. No evidence of outlet obstruction. Duodenum is normally positioned as is the ligament of Treitz. No small bowel wall thickening. No small bowel dilatation. No gross colonic mass. No colonic wall thickening. Diverticular changes are noted in the left colon without evidence of diverticulitis. Vascular/Lymphatic: There is moderate atherosclerotic calcification of the abdominal aorta without aneurysm. There is no gastrohepatic or hepatoduodenal ligament lymphadenopathy. No retroperitoneal or mesenteric lymphadenopathy. No pelvic sidewall lymphadenopathy. Reproductive: Brachytherapy are seen in the prostate gland. Other: No intraperitoneal free fluid. Musculoskeletal: No worrisome lytic or sclerotic osseous abnormality. IMPRESSION: 1. Stable exam. No new or progressive findings to suggest recurrent or metastatic disease in the chest, abdomen, or pelvis. 2. Stable radiation fibrosis in the medial and parahilar left lung with associated volume loss in left hemithorax. 3. Stable bilateral pulmonary nodules. 4. Left colonic diverticulosis without diverticulitis. 5.  Aortic Atherosclerosis (ICD10-I70.0). Electronically Signed   By: Kennith Center M.D.   On: 09/07/2022 08:54     ASSESSMENT/PLAN:  This is a very pleasant 82 year old [name] diagnosed with stage IV (T3, N2, M1a) non-small cell lung cancer, squamous cell carcinoma.  He presented with a left lower lobe obstructive lung mass in addition to mediastinal lymphadenopathy.  He also presented with bilateral pulmonary nodules.  He was diagnosed in July 2021.  His PD-L1 expression is negative.   He completed palliative radiotherapy to the obstructive lung mass under the care of Dr. Mitzi Hansen in August 2021.  He completed 2 years of treatment with systemic chemotherapy with carboplatin and paclitaxel for 2  cycles in addition to immunotherapy with ipilimumab 1 mg/KG every 6 weeks and nivolumab 360 mg IV every 3 weeks.  He is status post 22 cycles.   He is currently on observation and feeling well.  The patient was seen with Dr. Arbutus Ped today.  Dr. Arbutus Ped personally independently reviewed the scan and discussed with patient today.  The scan showed no evidence of disease progression  Mohamed recommends that he continue on observation with a restaging scan in 4 months.  Have ordered his scan without IV contrast due to his CKD.  It sounds like it has been a while since his Port-A-Cath has been flushed.  I let the patient know this needs to be flushed every 6 to 8 weeks.  The patient states that last time they attempted to flush that no blood return was obtained.  We will try and flush his port today.  If no blood return, we may consider him for a IR dye study.  His blood  pressure was rechecked at 98/56 today.  The patient is asymptomatic.  I advised the patient that if his systolic blood pressure is 120s to 130s or less to hold his antihypertensives that day.  He has a blood pressure cuff at home.  I asked him to keep a log of his readings and share the results with his primary care provider.  He was advised to increase his Ensure intake to 2 times per day and to try to continue to hydrate and eat well at home with small frequent meals.  Should he ever have hypotension and symptoms such as lightheadedness or dizziness, he is advised to seek emergency evaluation.  He denies any recent dehydration such as diarrhea or nausea and vomiting.  For his dry eye, I encouraged him to use Refresh Tears until he can establish with an eye doctor.  The patient was advised to call immediately if he has any concerning symptoms in the interval. The patient voices understanding of current disease status and treatment options and is in agreement with the current care plan. All questions were answered. The patient knows to call  the clinic with any problems, questions or concerns. We can certainly see the patient much sooner if necessary     Orders Placed This Encounter  Procedures   CT CHEST ABDOMEN PELVIS WO CONTRAST    Standing Status:   Future    Standing Expiration Date:   09/15/2023    Order Specific Question:   Preferred imaging location?    Answer:   [facility]    Order Specific Question:   If indicated for the ordered procedure, I authorize the administration of oral contrast media per Radiology protocol    Answer:   Yes    Order Specific Question:   Does the patient have a contrast media/X-ray dye allergy?    Answer:   No   CBC with Differential (Cancer Center Only)    Standing Status:   Future    Standing Expiration Date:   09/15/2023   CMP (Cancer Center only)    Standing Status:   Future    Standing Expiration Date:   09/15/2023      Johnette Abraham , PA-C 09/15/22  ADDENDUM: Hematology/Oncology Attending: I had a face-to-face encounter with the patient today.  I reviewed his record, lab, scan and recommended his care plan.  This is a very pleasant 82 years old [name] with a stage IV non-small cell lung cancer, squamous cell carcinoma diagnosed in July 2021 with negative PD-L1 expression.  The patient was treated initially with 2 cycles of systemic chemotherapy with carboplatin and paclitaxel in addition to immunotherapy which was extended up to 2 years he has tolerated his treatment well with no concerning adverse effects.  His last dose was completed in January 2024 and he is currently on observation.  The patient is feeling fine with no concerning complaints.  He had repeat CT scan of the chest, abdomen and pelvis performed recently.  I personally and independently reviewed the scan and discussed the result with the patient today.  His scan showed no concerning findings for disease progression. I recommended for him to continue on observation with repeat CT scan of  the chest, abdomen and pelvis in 4 months.  He will continue to have Port-A-Cath flush every 2 months during this time.  The patient was advised to call immediately if he has any other concerning symptoms in the interval. The total time spent in the appointment was  20 minutes. Disclaimer: This note was dictated with voice recognition software. Similar sounding words can inadvertently be transcribed and may be missed upon review. Lajuana Matte, MD

## 2022-09-14 ENCOUNTER — Telehealth: Payer: Self-pay | Admitting: Medical Oncology

## 2022-09-14 ENCOUNTER — Other Ambulatory Visit: Payer: Medicare HMO | Admitting: Gastroenterology

## 2022-09-14 NOTE — Telephone Encounter (Signed)
LVM of next appt.

## 2022-09-15 ENCOUNTER — Inpatient Hospital Stay: Payer: Medicare HMO | Attending: Internal Medicine | Admitting: Physician Assistant

## 2022-09-15 ENCOUNTER — Other Ambulatory Visit: Payer: Self-pay

## 2022-09-15 ENCOUNTER — Telehealth: Payer: Self-pay | Admitting: Medical Oncology

## 2022-09-15 VITALS — BP 98/56 | HR 64 | Temp 98.4°F | Resp 17 | Wt 187.0 lb

## 2022-09-15 DIAGNOSIS — C3432 Malignant neoplasm of lower lobe, left bronchus or lung: Secondary | ICD-10-CM

## 2022-09-15 DIAGNOSIS — Z7189 Other specified counseling: Secondary | ICD-10-CM | POA: Diagnosis not present

## 2022-09-15 DIAGNOSIS — C3490 Malignant neoplasm of unspecified part of unspecified bronchus or lung: Secondary | ICD-10-CM | POA: Diagnosis not present

## 2022-09-15 DIAGNOSIS — N189 Chronic kidney disease, unspecified: Secondary | ICD-10-CM | POA: Insufficient documentation

## 2022-09-15 DIAGNOSIS — I129 Hypertensive chronic kidney disease with stage 1 through stage 4 chronic kidney disease, or unspecified chronic kidney disease: Secondary | ICD-10-CM | POA: Diagnosis not present

## 2022-09-15 DIAGNOSIS — R6889 Other general symptoms and signs: Secondary | ICD-10-CM | POA: Insufficient documentation

## 2022-09-15 DIAGNOSIS — Z923 Personal history of irradiation: Secondary | ICD-10-CM | POA: Diagnosis not present

## 2022-09-15 DIAGNOSIS — Z9221 Personal history of antineoplastic chemotherapy: Secondary | ICD-10-CM | POA: Diagnosis not present

## 2022-09-15 DIAGNOSIS — Z85118 Personal history of other malignant neoplasm of bronchus and lung: Secondary | ICD-10-CM | POA: Insufficient documentation

## 2022-09-15 NOTE — Telephone Encounter (Signed)
Per Cassie , dtr , Shaun Kennedy was notified that pt scan is stable and he will be on observation.

## 2022-09-17 ENCOUNTER — Encounter (HOSPITAL_COMMUNITY): Payer: Self-pay

## 2022-09-17 ENCOUNTER — Emergency Department (HOSPITAL_COMMUNITY)
Admission: EM | Admit: 2022-09-17 | Discharge: 2022-09-18 | Discharge status: Against Medical Advice | Payer: Medicare HMO | Attending: Emergency Medicine | Admitting: Emergency Medicine

## 2022-09-17 ENCOUNTER — Other Ambulatory Visit: Payer: Self-pay

## 2022-09-17 DIAGNOSIS — Z5321 Procedure and treatment not carried out due to patient leaving prior to being seen by health care provider: Secondary | ICD-10-CM | POA: Diagnosis not present

## 2022-09-17 DIAGNOSIS — R21 Rash and other nonspecific skin eruption: Secondary | ICD-10-CM | POA: Insufficient documentation

## 2022-09-17 NOTE — ED Triage Notes (Signed)
Pt arrived from home via POV c/o redness and burning peri trach stoma that began on Thursday. Pt states that usually this problem goes away with in a day, but this time is has persisted.

## 2022-09-19 ENCOUNTER — Telehealth: Payer: Self-pay | Admitting: Gastroenterology

## 2022-09-19 DIAGNOSIS — H04123 Dry eye syndrome of bilateral lacrimal glands: Secondary | ICD-10-CM | POA: Diagnosis not present

## 2022-09-19 DIAGNOSIS — M542 Cervicalgia: Secondary | ICD-10-CM | POA: Diagnosis not present

## 2022-09-19 DIAGNOSIS — J386 Stenosis of larynx: Secondary | ICD-10-CM | POA: Diagnosis not present

## 2022-09-19 DIAGNOSIS — Z930 Tracheostomy status: Secondary | ICD-10-CM | POA: Diagnosis not present

## 2022-09-19 NOTE — Telephone Encounter (Signed)
Left message, no answer.

## 2022-09-19 NOTE — Telephone Encounter (Signed)
Inbound call from patient's wife requesting a call back regarding 8/14 procedure. Would like to discuss the steps of the procedure. Please advise, thank you.

## 2022-09-21 ENCOUNTER — Encounter: Payer: Self-pay | Admitting: Gastroenterology

## 2022-09-21 ENCOUNTER — Ambulatory Visit: Payer: Medicare HMO | Admitting: Gastroenterology

## 2022-09-21 VITALS — BP 111/74 | HR 81 | Temp 98.9°F | Resp 23

## 2022-09-21 DIAGNOSIS — R933 Abnormal findings on diagnostic imaging of other parts of digestive tract: Secondary | ICD-10-CM

## 2022-09-21 DIAGNOSIS — J45909 Unspecified asthma, uncomplicated: Secondary | ICD-10-CM | POA: Diagnosis not present

## 2022-09-21 DIAGNOSIS — K2970 Gastritis, unspecified, without bleeding: Secondary | ICD-10-CM | POA: Diagnosis not present

## 2022-09-21 DIAGNOSIS — J9500 Unspecified tracheostomy complication: Secondary | ICD-10-CM | POA: Diagnosis not present

## 2022-09-21 DIAGNOSIS — J386 Stenosis of larynx: Secondary | ICD-10-CM | POA: Diagnosis not present

## 2022-09-21 DIAGNOSIS — I10 Essential (primary) hypertension: Secondary | ICD-10-CM | POA: Diagnosis not present

## 2022-09-21 MED ORDER — SODIUM CHLORIDE 0.9 % IV SOLN: 500.0000 mL | Freq: Once | INTRAVENOUS | Status: DC

## 2022-09-21 NOTE — Progress Notes (Unsigned)
Sedate, gd SR, tolerated procedure well, VSS, report to RN 

## 2022-09-21 NOTE — Patient Instructions (Addendum)
Continue present medications. Await pathology results.  YOU HAD AN ENDOSCOPIC PROCEDURE TODAY AT THE [facility]:   Refer to the procedure report that was given to you for any specific questions about what was found during the examination.  If the procedure report does not answer your questions, please call your gastroenterologist to clarify.  If you requested that your care partner not be given the details of your procedure findings, then the procedure report has been included in a sealed envelope for you to review at your convenience later.  YOU SHOULD EXPECT: Some feelings of bloating in the abdomen. Passage of more gas than usual.  Walking can help get rid of the air that was put into your GI tract during the procedure and reduce the bloating.  Please Note:  You might notice some irritation and congestion in your nose or some drainage.  This is from the oxygen used during your procedure.  There is no need for concern and it should clear up in a day or so.  SYMPTOMS TO REPORT IMMEDIATELY:  Following upper endoscopy (EGD)  Vomiting of blood or coffee ground material  New chest pain or pain under the shoulder blades  Painful or persistently difficult swallowing  New shortness of breath  Fever of 100F or higher  Black, tarry-looking stools  For urgent or emergent issues, a gastroenterologist can be reached at any hour by calling [phone]. Do not use MyChart messaging for urgent concerns.    DIET:  We do recommend a small meal at first, but then you may proceed to your regular diet.  Drink plenty of fluids but you should avoid alcoholic beverages for 24 hours.  ACTIVITY:  You should plan to take it easy for the rest of today and you should NOT DRIVE or use heavy machinery until tomorrow (because of the sedation medicines used during the test).    FOLLOW UP: Our staff will call the number listed on your records the next business day following your procedure.  We will  call around 7:15- 8:00 am to check on you and address any questions or concerns that you may have regarding the information given to you following your procedure. If we do not reach you, we will leave a message.     If any biopsies were taken you will be contacted by phone or by letter within the next 1-3 weeks.  Please call us at [phone] if you have not heard about the biopsies in 3 weeks.    SIGNATURES/CONFIDENTIALITY: You and/or your care partner have signed paperwork which will be entered into your electronic medical record.  These signatures attest to the fact that that the information above on your After Visit Summary has been reviewed and is understood.  Full responsibility of the confidentiality of this discharge information lies with you and/or your care-partner.

## 2022-09-21 NOTE — Op Note (Addendum)
[facility] Patient Name: Shaun Kennedy Procedure Date: 09/21/2022 10:02 AM MRN: [id number] Endoscopist: Corliss Parish , MD, [id number] Age: 82 Referring MD:  Date of Birth: 1940/09/25 Gender: Male Account #: [id number] Procedure:                Small bowel enteroscopy Indications:              Abnormal abdominal CT (previous ileal thickening                            which was followed up with enterography and GIST                            showed jejunal thickening and esophageal                            patulousness; most recent imaging done for lung                            cancer follow-up showed improvement of all GI                            findings) Medicines:                Monitored Anesthesia Care Procedure:                Pre-Anesthesia Assessment:                           - Prior to the procedure, a History and Physical                            was performed, and patient medications and                            allergies were reviewed. The patient's tolerance of                            previous anesthesia was also reviewed. The risks                            and benefits of the procedure and the sedation                            options and risks were discussed with the patient.                            All questions were answered, and informed consent                            was obtained. Prior Anticoagulants: The patient has                            taken no anticoagulant or antiplatelet agents. ASA  Grade Assessment: III - A patient with severe                            systemic disease. After reviewing the risks and                            benefits, the patient was deemed in satisfactory                            condition to undergo the procedure.                           After obtaining informed consent, the endoscope was                            passed under direct vision. Throughout the                             procedure, the patient's blood pressure, pulse, and                            oxygen saturations were monitored continuously. The                            PCF-[id number] Slim SN [id number] was introduced through                            the mouth, and advanced to the proximal jejunum.                            After obtaining informed consent, the endoscope was                            passed under direct vision. Throughout the                            procedure, the patient's blood pressure, pulse, and                            oxygen saturations were monitored continuously.The                            small bowel enteroscopy was accomplished without                            difficulty. The patient tolerated the procedure. Scope In: Scope Out: Findings:                 No gross lesions were noted in the entire esophagus.                           The Z-line was regular and was found 37 cm from the  incisors.                           A 3 cm hiatal hernia was present.                           Patchy mildly erythematous mucosa without bleeding                            was found in the gastric antrum.                           No other gross lesions were noted in the entire                            examined stomach. Biopsies were taken with a cold                            forceps for histology.                           Normal mucosa was found in the entire visualized                            duodenum.                           Normal mucosa was found in the visualized proximal                            jejunum. Area was tattooed with an injection of                            Spot (carbon black) to demarcate distal extent of                            today's SBE.                           Biopsies were taken with a cold forceps in the                            visualized the duodenum and proximal jejunum for                             histology. Complications:            No immediate complications. Estimated Blood Loss:     Estimated blood loss was minimal. Impression:               - No gross lesions in the entire esophagus. Z-line                            regular, 37 cm from the incisors.                           -  3 cm hiatal hernia.                           - Erythematous mucosa in the antrum. No other gross                            lesions in the entire stomach. Biopsied.                           - Normal mucosa was found in the entire examined                            duodenum.                           - Normal mucosa was found in the visualized                            proximal jejunum. Tattooed distal extent.                           - Biopsies were taken with a cold forceps for                            histology from the duodenum and visualized proximal                            jejunum for enteropathy rule out. Recommendation:           - The patient will be observed post-procedure,                            until all discharge criteria are met.                           - Discharge patient to home.                           - Resume previous diet.                           - Continue present medications.                           - Await pathology results.                           - The findings and recommendations were discussed                            with the patient.                           - The findings and recommendations were discussed                            with the patient's  family. Corliss Parish, MD 09/21/2022 10:57:08 AM

## 2022-09-21 NOTE — Progress Notes (Unsigned)
Called to room to assist during endoscopic procedure.  Patient ID and intended procedure confirmed with present staff. Received instructions for my participation in the procedure from the performing physician.  

## 2022-09-21 NOTE — Progress Notes (Unsigned)
Trach suctioned per T. Blocker CRNA d/t secretions.  Oxygen sats 100%

## 2022-09-21 NOTE — Progress Notes (Unsigned)
GASTROENTEROLOGY PROCEDURE H&P NOTE   Primary Care Physician: Ronnald Nian, MD  HPI: Shaun Kennedy is a 82 y.o. male who presents for Enteroscopy to evaluate jejunal thickening and esophageal thickening previously noted now improved.  Past Medical History:  Diagnosis Date   Allergic rhinitis    Asthma    Carotid stenosis    Colon cancer (HCC) 2003   Diabetes mellitus (HCC)    Diverticulosis    Dyslipidemia    ED (erectile dysfunction)    GERD (gastroesophageal reflux disease)    H/O degenerative disc disease    Hemorrhoids    HTN (hypertension)    as a child   Hyperlipidemia    Lung cancer (HCC)    LVH (left ventricular hypertrophy)    on EKG   Mass of lower lobe of left lung    Mediastinal adenopathy    Smoker    former   Wears dentures    Wears dentures    Wears glasses    Wears glasses    Past Surgical History:  Procedure Laterality Date   BRONCHIAL BIOPSY  08/20/2019   Procedure: BRONCHIAL BIOPSIES;  Surgeon: Leslye Peer, MD;  Location: [facility] ENDOSCOPY;  Service: Pulmonary;;   BRONCHIAL BRUSHINGS  08/20/2019   Procedure: BRONCHIAL BRUSHINGS;  Surgeon: Leslye Peer, MD;  Location: [facility] ENDOSCOPY;  Service: Pulmonary;;   BRONCHIAL NEEDLE ASPIRATION BIOPSY  08/20/2019   Procedure: BRONCHIAL NEEDLE ASPIRATION BIOPSIES;  Surgeon: Leslye Peer, MD;  Location: [facility] ENDOSCOPY;  Service: Pulmonary;;   CARPAL TUNNEL RELEASE Right 01/09/2019   Procedure: CARPAL TUNNEL RELEASE;  Surgeon: Tarry Kos, MD;  Location: [facility];  Service: Orthopedics;  Laterality: Right;   CARPAL TUNNEL WITH CUBITAL TUNNEL Right 11/18/2020   Procedure: CARPAL TUNNEL WITH CUBITAL TUNNEL;  Surgeon: Dairl Ponder, MD;  Location: [facility] OR;  Service: Orthopedics;  Laterality: Right;   CATARACT EXTRACTION Right 2018   COLONOSCOPY  2007   Dr. Elnoria Surafel   DIRECT LARYNGOSCOPY N/A 04/10/2020   Procedure: DIRECT LARYNGOSCOPY;  Surgeon: Christia Reading, MD;  Location: [facility] OR;  Service:  ENT;  Laterality: N/A;   I & D EXTREMITY Right 04/02/2016   Procedure: IRRIGATION AND DEBRIDEMENT GREAT TOE;  Surgeon: Tarry Kos, MD;  Location: [facility] OR;  Service: Orthopedics;  Laterality: Right;   IR IMAGING GUIDED PORT INSERTION  08/30/2019   MULTIPLE TOOTH EXTRACTIONS     RADIOACTIVE SEED IMPLANT  2003   TRACHEOSTOMY TUBE PLACEMENT N/A 04/10/2020   Procedure: TRACHEOSTOMY;  Surgeon: Christia Reading, MD;  Location: [facility] OR;  Service: ENT;  Laterality: N/A;   ULNAR TUNNEL RELEASE Right 01/09/2019   Procedure: RIGHT CUBITAL TUNNEL RELEASE AND CARPAL TUNNEL RELEASE;  Surgeon: Tarry Kos, MD;  Location: [facility];  Service: Orthopedics;  Laterality: Right;   UPPER GASTROINTESTINAL ENDOSCOPY     VIDEO BRONCHOSCOPY N/A 12/18/2019   Procedure: VIDEO BRONCHOSCOPY WITHOUT FLUORO;  Surgeon: Leslye Peer, MD;  Location: [facility] ENDOSCOPY;  Service: Cardiopulmonary;  Laterality: N/A;   VIDEO BRONCHOSCOPY WITH ENDOBRONCHIAL ULTRASOUND N/A 08/20/2019   Procedure: VIDEO BRONCHOSCOPY WITH ENDOBRONCHIAL ULTRASOUND;  Surgeon: Leslye Peer, MD;  Location: [facility] ENDOSCOPY;  Service: Pulmonary;  Laterality: N/A;   Current Outpatient Medications  Medication Sig Dispense Refill   Alcohol Swabs (DROPSAFE ALCOHOL PREP) 70 % PADS Apply topically.     amLODipine (NORVASC) 5 MG tablet Take 1 tablet (5 mg total) by mouth daily. 90 tablet 3   aspirin EC  81 MG tablet Take 81 mg by mouth daily after breakfast.      bisacodyl (DULCOLAX) 5 MG EC tablet Take 10 mg by mouth daily as needed for moderate constipation.     Blood Glucose Calibration (TRUE METRIX LEVEL 1) Low SOLN      Blood Glucose Monitoring Suppl (TRUE METRIX AIR GLUCOSE METER) w/Device KIT USE AS DIRECTED 1 kit 3   cholecalciferol (VITAMIN D) 25 MCG (1000 UNIT) tablet Take 1,000 Units by mouth daily.     donepezil (ARICEPT) 10 MG tablet Take 1 tablet (10 mg total) by mouth at bedtime. 90 tablet 0   ferrous sulfate 325 (65 FE) MG EC tablet TAKE 1  TABLET (325 MG TOTAL) DAILY WITH BREAKFAST. 90 tablet 1   gabapentin (NEURONTIN) 100 MG capsule Take by mouth.     hydrOXYzine (ATARAX) 10 MG tablet Take 1 tablet (10 mg total) by mouth 3 (three) times daily as needed. 90 tablet 1   Lancet Devices (TRUEDRAW LANCING DEVICE) MISC Check sugars daily 90 each 0   lisinopril-hydrochlorothiazide (ZESTORETIC) 20-12.5 MG tablet Take 1 tablet by mouth daily. 90 tablet 3   metFORMIN (GLUCOPHAGE-XR) 500 MG 24 hr tablet Take 1 tablet (500 mg total) by mouth daily. 90 tablet 1   Multiple Vitamin (MULTIVITAMIN WITH MINERALS) TABS tablet Take 1 tablet by mouth daily after breakfast.      simvastatin (ZOCOR) 40 MG tablet TAKE 1 TABLET EVERY DAY 90 tablet 1   solifenacin (VESICARE) 5 MG tablet TAKE 1 TABLET EVERY DAY 90 tablet 1   TRUEplus Lancets 33G MISC 1 each by Does not apply route 2 (two) times daily. (Patient not taking: Reported on 09/05/2022) 200 each 4   Current Facility-Administered Medications  Medication Dose Route Frequency Provider Last Rate Last Admin   0.9 %  sodium chloride infusion  500 mL Intravenous Once Mansouraty, Netty Starring., MD        Current Outpatient Medications:    Alcohol Swabs (DROPSAFE ALCOHOL PREP) 70 % PADS, Apply topically., Disp: , Rfl:    amLODipine (NORVASC) 5 MG tablet, Take 1 tablet (5 mg total) by mouth daily., Disp: 90 tablet, Rfl: 3   aspirin EC 81 MG tablet, Take 81 mg by mouth daily after breakfast. , Disp: , Rfl:    bisacodyl (DULCOLAX) 5 MG EC tablet, Take 10 mg by mouth daily as needed for moderate constipation., Disp: , Rfl:    Blood Glucose Calibration (TRUE METRIX LEVEL 1) Low SOLN, , Disp: , Rfl:    Blood Glucose Monitoring Suppl (TRUE METRIX AIR GLUCOSE METER) w/Device KIT, USE AS DIRECTED, Disp: 1 kit, Rfl: 3   cholecalciferol (VITAMIN D) 25 MCG (1000 UNIT) tablet, Take 1,000 Units by mouth daily., Disp: , Rfl:    donepezil (ARICEPT) 10 MG tablet, Take 1 tablet (10 mg total) by mouth at bedtime., Disp: 90  tablet, Rfl: 0   ferrous sulfate 325 (65 FE) MG EC tablet, TAKE 1 TABLET (325 MG TOTAL) DAILY WITH BREAKFAST., Disp: 90 tablet, Rfl: 1   gabapentin (NEURONTIN) 100 MG capsule, Take by mouth., Disp: , Rfl:    hydrOXYzine (ATARAX) 10 MG tablet, Take 1 tablet (10 mg total) by mouth 3 (three) times daily as needed., Disp: 90 tablet, Rfl: 1   Lancet Devices (TRUEDRAW LANCING DEVICE) MISC, Check sugars daily, Disp: 90 each, Rfl: 0   lisinopril-hydrochlorothiazide (ZESTORETIC) 20-12.5 MG tablet, Take 1 tablet by mouth daily., Disp: 90 tablet, Rfl: 3   metFORMIN (GLUCOPHAGE-XR) 500 MG 24  hr tablet, Take 1 tablet (500 mg total) by mouth daily., Disp: 90 tablet, Rfl: 1   Multiple Vitamin (MULTIVITAMIN WITH MINERALS) TABS tablet, Take 1 tablet by mouth daily after breakfast. , Disp: , Rfl:    simvastatin (ZOCOR) 40 MG tablet, TAKE 1 TABLET EVERY DAY, Disp: 90 tablet, Rfl: 1   solifenacin (VESICARE) 5 MG tablet, TAKE 1 TABLET EVERY DAY, Disp: 90 tablet, Rfl: 1   TRUEplus Lancets 33G MISC, 1 each by Does not apply route 2 (two) times daily. (Patient not taking: Reported on 09/05/2022), Disp: 200 each, Rfl: 4  Current Facility-Administered Medications:    0.9 %  sodium chloride infusion, 500 mL, Intravenous, Once, Mansouraty, Netty Starring., MD No Known Allergies Family History  Problem Relation Age of Onset   Heart failure Mother    Diabetes Mother    Stroke Father    Diabetes Father    Colon cancer Sister    Diabetes Sister    Diabetes Sister    Lung cancer Sister 33   Esophageal cancer Neg Hx    Rectal cancer Neg Hx    Colon polyps Neg Hx    Stomach cancer Neg Hx    Social History   Socioeconomic History   Marital status: Married    Spouse name: Not on file   Number of children: 3   Years of education: Not on file   Highest education level: Not on file  Occupational History   Occupation: retired  Tobacco Use   Smoking status: Former    Current packs/day: 0.00    Average packs/day: 1  pack/day for 32.0 years (32.0 ttl pk-yrs)    Types: Cigarettes    Start date: 02/08/1955    Quit date: 10/08/1985    Years since quitting: 36.9   Smokeless tobacco: Never  Vaping Use   Vaping status: Never Used  Substance and Sexual Activity   Alcohol use: No   Drug use: No   Sexual activity: Yes  Other Topics Concern   Not on file  Social History Narrative   Married, no pets   Social Determinants of Health   Financial Resource Strain: Low Risk  (04/19/2022)   Overall Financial Resource Strain (CARDIA)    Difficulty of Paying Living Expenses: Not hard at all  Food Insecurity: Low Risk  (09/19/2022)   Received from [facility]   Food vital sign    Within the past 12 months, you worried that your food would run out before you got money to buy more: Never true    Within the past 12 months, the food you bought just didn't last and you didn't have money to get more. : Never true  Transportation Needs: No Transportation Needs (04/19/2022)   PRAPARE - Administrator, Civil Service (Medical): No    Lack of Transportation (Non-Medical): No  Physical Activity: Sufficiently Active (04/19/2022)   Exercise Vital Sign    Days of Exercise per Week: 2 days    Minutes of Exercise per Session: 90 min  Stress: No Stress Concern Present (04/19/2022)   [employer] of Occupational Health - Occupational Stress Questionnaire    Feeling of Stress : Not at all  Social Connections: Not on file  Intimate Partner Violence: Not on file    Physical Exam: Today's Vitals   09/21/22 1007  Temp: 98.9 F (37.2 C)   There is no height or weight on file to calculate BMI. GEN: NAD EYE: Sclerae anicteric ENT: MMM CV: Non-tachycardic GI:  Soft, NT/ND NEURO:  Alert & Oriented x 3  Lab Results: No results for input(s): "WBC", "HGB", "HCT", "PLT" in the last 72 hours. BMET No results for input(s): "NA", "K", "CL", "CO2", "GLUCOSE", "BUN", "CREATININE", "CALCIUM" in the last 72 hours. LFT No  results for input(s): "PROT", "ALBUMIN", "AST", "ALT", "ALKPHOS", "BILITOT", "BILIDIR", "IBILI" in the last 72 hours. PT/INR No results for input(s): "LABPROT", "INR" in the last 72 hours.   Impression / Plan: This is a 82 y.o.male  who presents for Enteroscopy to evaluate jejunal thickening and esophageal thickening previously noted now improved.  The risks and benefits of endoscopic evaluation/treatment were discussed with the patient and/or family; these include but are not limited to the risk of perforation, infection, bleeding, missed lesions, lack of diagnosis, severe illness requiring hospitalization, as well as anesthesia and sedation related illnesses.  The patient's history has been reviewed, patient examined, no change in status, and deemed stable for procedure.  The patient and/or family is agreeable to proceed.    Corliss Parish, MD [facility] Office # [phone]

## 2022-09-21 NOTE — Progress Notes (Signed)
Pt's states no medical or surgical changes since previsit or office visit. Trach.

## 2022-09-22 ENCOUNTER — Telehealth: Payer: Self-pay | Admitting: *Deleted

## 2022-09-22 NOTE — Telephone Encounter (Signed)
  Follow up Call-     09/21/2022   10:07 AM  Call back number  Post procedure Call Back phone  # [phone]  Permission to leave phone message Yes     Patient questions:  Do you have a fever, pain , or abdominal swelling? No. Pain Score  0 *  Have you tolerated food without any problems? Yes.    Have you been able to return to your normal activities? Yes.    Do you have any questions about your discharge instructions: Diet   No. Medications  No. Follow up visit  No.  Do you have questions or concerns about your Care? No.  Actions: * If pain score is 4 or above: No action needed, pain <4.

## 2022-09-23 ENCOUNTER — Ambulatory Visit: Payer: Self-pay

## 2022-09-23 NOTE — Patient Outreach (Signed)
  Care Coordination   09/23/2022 Name: Shaun Kennedy MRN: [id number] DOB: 1940-10-15   Care Coordination Outreach Attempts:  An unsuccessful telephone outreach was attempted for a scheduled appointment today.  Follow Up Plan:  Additional outreach attempts will be made to offer the patient care coordination information and services.   Encounter Outcome:  No Answer   Care Coordination Interventions:  No, not indicated    Delsa Sale, RN, BSN, CCM Care Management Coordinator [facility] Care Management  Direct Phone: [phone]

## 2022-09-27 ENCOUNTER — Encounter: Payer: Self-pay | Admitting: Gastroenterology

## 2022-10-05 ENCOUNTER — Encounter: Payer: Self-pay | Admitting: Family Medicine

## 2022-10-05 ENCOUNTER — Ambulatory Visit (INDEPENDENT_AMBULATORY_CARE_PROVIDER_SITE_OTHER): Payer: Medicare HMO | Admitting: Family Medicine

## 2022-10-05 ENCOUNTER — Inpatient Hospital Stay: Payer: Medicare HMO

## 2022-10-05 VITALS — BP 122/72 | HR 60 | Ht 67.0 in | Wt 187.8 lb

## 2022-10-05 DIAGNOSIS — E119 Type 2 diabetes mellitus without complications: Secondary | ICD-10-CM

## 2022-10-05 DIAGNOSIS — E785 Hyperlipidemia, unspecified: Secondary | ICD-10-CM | POA: Diagnosis not present

## 2022-10-05 DIAGNOSIS — Z23 Encounter for immunization: Secondary | ICD-10-CM | POA: Diagnosis not present

## 2022-10-05 DIAGNOSIS — E1121 Type 2 diabetes mellitus with diabetic nephropathy: Secondary | ICD-10-CM | POA: Diagnosis not present

## 2022-10-05 DIAGNOSIS — E1159 Type 2 diabetes mellitus with other circulatory complications: Secondary | ICD-10-CM

## 2022-10-05 DIAGNOSIS — I152 Hypertension secondary to endocrine disorders: Secondary | ICD-10-CM | POA: Diagnosis not present

## 2022-10-05 DIAGNOSIS — C3432 Malignant neoplasm of lower lobe, left bronchus or lung: Secondary | ICD-10-CM

## 2022-10-05 DIAGNOSIS — E1169 Type 2 diabetes mellitus with other specified complication: Secondary | ICD-10-CM | POA: Diagnosis not present

## 2022-10-05 DIAGNOSIS — J398 Other specified diseases of upper respiratory tract: Secondary | ICD-10-CM | POA: Diagnosis not present

## 2022-10-05 LAB — POCT GLYCOSYLATED HEMOGLOBIN (HGB A1C): Hemoglobin A1C: 6.2 % — AB (ref 4.0–5.6)

## 2022-10-05 MED ORDER — LANCETS MISC. MISC: 1.0000 | Freq: Three times a day (TID) | 0 refills | Status: AC

## 2022-10-05 MED ORDER — BLOOD GLUCOSE MONITORING SUPPL DEVI: 1.0000 | Freq: Three times a day (TID) | 0 refills | Status: DC

## 2022-10-05 MED ORDER — LANCET DEVICE MISC: 1.0000 | Freq: Three times a day (TID) | 0 refills | Status: AC

## 2022-10-05 MED ORDER — BLOOD GLUCOSE TEST VI STRP: 1.0000 | ORAL_STRIP | Freq: Three times a day (TID) | 0 refills | Status: AC

## 2022-10-05 NOTE — Progress Notes (Signed)
   Subjective:    Patient ID: Shaun Kennedy, male    DOB: [birth year], 82 y.o.   MRN: [id number]  HPI He is here for recheck on his diabetes.  He continues on metformin and has no difficulty with that.  He is also taking Crestor.  Continues on Zestril.  Amlodipine.  He is not having any new pulmonary symptoms and doing well with his tracheostomy.  He has no other concerns or complaints.   Review of Systems     Objective:    Physical Exam Alert and in no distress.  Hemoglobin A1c is 6.2.  Medical record was reviewed.     Assessment & Plan:   Problem List Items Addressed This Visit     Diabetes mellitus type II, non insulin dependent (HCC) - Primary   Relevant Orders   POCT glycosylated hemoglobin (Hb A1C) (Completed)   POCT UA - Microalbumin   Diabetic nephropathy associated with type 2 diabetes mellitus (HCC)   Hyperlipidemia associated with type 2 diabetes mellitus (HCC)   Hypertension associated with diabetes (HCC)   Malignant neoplasm of lower lobe of left lung (HCC)   Other Visit Diagnoses     Need for influenza vaccination       Relevant Orders   Flu Vaccine Trivalent High Dose (Fluad) (Completed)     Continue on present medication regimen and follow-up here in roughly 4 months.

## 2022-10-13 DIAGNOSIS — J386 Stenosis of larynx: Secondary | ICD-10-CM | POA: Diagnosis not present

## 2022-10-13 DIAGNOSIS — Z930 Tracheostomy status: Secondary | ICD-10-CM | POA: Diagnosis not present

## 2022-10-13 DIAGNOSIS — C3432 Malignant neoplasm of lower lobe, left bronchus or lung: Secondary | ICD-10-CM | POA: Diagnosis not present

## 2022-10-24 ENCOUNTER — Ambulatory Visit: Payer: Self-pay

## 2022-10-24 DIAGNOSIS — Z930 Tracheostomy status: Secondary | ICD-10-CM | POA: Diagnosis not present

## 2022-10-24 NOTE — Patient Instructions (Signed)
Visit Information  Thank you for taking time to visit with me today. Please don't hesitate to contact me if I can be of assistance to you.   Following are the goals we discussed today:   Goals Addressed             This Visit's Progress    COMPLETED: To follow up with ENT       Care Coordination Interventions: Evaluation of current treatment plan related to tracheostomy care and patient's adherence to plan as established by provider Reviewed with patient he completed an enteroscopy for evaluation of jejunal thickening  Discussed with patient it was determined he will not be a candidate for tracheostomy reversal at this time  Determined patient will continue to adhere to his trach care and trach clinic follow up visits as directed Instructed patient to notify his doctor of new symptoms or concerns promptly      To keep A1c <7.0 %   On track    Care Coordination Interventions: Counseled on importance of regular laboratory monitoring as prescribed Advised patient, providing education and rationale, to check cbg daily before meals and at bedtime and record, calling PCP for findings outside established parameters Review of patient status, including review of consultants reports, relevant laboratory and other test results, and medications completed Counseled on Diabetic diet, my plate method, 253 minutes of moderate intensity exercise/week Lab Results  Component Value Date   HGBA1C 6.2 (A) 10/05/2022      To maintain current cancer status   On track    Care Coordination Interventions: Assessed patient understanding of cancer diagnosis and recommended treatment plan Reviewed upcoming provider appointments and treatment appointments Assessed available transportation to appointments and treatments. Has consistent/reliable transportation: Yes Assessed support system. Has consistent/reliable family or other support: Yes        Our next appointment is by telephone on 02/10/23 at 10:30  AM  Please call the care guide team at [phone] if you need to cancel or reschedule your appointment.   If you are experiencing a Mental Health or Behavioral Health Crisis or need someone to talk to, please call 1-800-273-TALK (toll free, 24 hour hotline)  Patient verbalizes understanding of instructions and care plan provided today and agrees to view in MyChart. Active MyChart status and patient understanding of how to access instructions and care plan via MyChart confirmed with patient.     Delsa Sale RN BSN CCM [place] Health Nurse Care Coordinator  Direct Dial: [phone] Website: [email or web address]

## 2022-10-24 NOTE — Patient Outreach (Signed)
Care Coordination   Follow Up Visit Note   10/24/2022 Name: Shaun Kennedy MRN: [id number] DOB: December 26, 1940  Shaun Kennedy is a 82 y.o. year old male who sees Ronnald Nian, MD for primary care. I spoke with  Mariah Milling by phone today.  What matters to the patients health and wellness today?  Patient would like to continue keeping his diabetes under good control.     Goals Addressed             This Visit's Progress    COMPLETED: To follow up with ENT       Care Coordination Interventions: Evaluation of current treatment plan related to tracheostomy care and patient's adherence to plan as established by provider Reviewed with patient he completed an enteroscopy for evaluation of jejunal thickening  Discussed with patient it was determined he will not be a candidate for tracheostomy reversal at this time  Determined patient will continue to adhere to his trach care and trach clinic follow up visits as directed Instructed patient to notify his doctor of new symptoms or concerns promptly      To keep A1c <7.0 %   On track    Care Coordination Interventions: Counseled on importance of regular laboratory monitoring as prescribed Advised patient, providing education and rationale, to check cbg daily before meals and at bedtime and record, calling PCP for findings outside established parameters Review of patient status, including review of consultants reports, relevant laboratory and other test results, and medications completed Counseled on Diabetic diet, my plate method, 409 minutes of moderate intensity exercise/week Lab Results  Component Value Date   HGBA1C 6.2 (A) 10/05/2022      To maintain current cancer status   On track    Care Coordination Interventions: Assessed patient understanding of cancer diagnosis and recommended treatment plan Reviewed upcoming provider appointments and treatment appointments Assessed available transportation to appointments and treatments.  Has consistent/reliable transportation: Yes Assessed support system. Has consistent/reliable family or other support: Yes    Interventions Today    Flowsheet Row Most Recent Value  Chronic Disease   Chronic disease during today's visit Diabetes, Other  [Tracheostomy]  General Interventions   General Interventions Discussed/Reviewed General Interventions Discussed, General Interventions Reviewed, Labs, Doctor Visits  Doctor Visits Discussed/Reviewed Doctor Visits Discussed, Doctor Visits Reviewed, PCP, Specialist  Exercise Interventions   Exercise Discussed/Reviewed Exercise Reviewed, Exercise Discussed  Education Interventions   Education Provided Provided Education  Provided Verbal Education On Labs, Eye Care, Foot Care, When to see the doctor  Labs Reviewed Hgb A1c  Nutrition Interventions   Nutrition Discussed/Reviewed Nutrition Discussed, Nutrition Reviewed  Pharmacy Interventions   Pharmacy Dicussed/Reviewed Pharmacy Topics Reviewed          SDOH assessments and interventions completed:  No     Care Coordination Interventions:  Yes, provided   Follow up plan: Follow up call scheduled for 02/10/23 @10 :30 AM    Encounter Outcome:  Patient Visit Completed

## 2022-10-31 ENCOUNTER — Telehealth: Payer: Self-pay | Admitting: Family Medicine

## 2022-10-31 DIAGNOSIS — B880 Other acariasis: Secondary | ICD-10-CM | POA: Diagnosis not present

## 2022-10-31 DIAGNOSIS — H01001 Unspecified blepharitis right upper eyelid: Secondary | ICD-10-CM | POA: Diagnosis not present

## 2022-10-31 DIAGNOSIS — H04123 Dry eye syndrome of bilateral lacrimal glands: Secondary | ICD-10-CM | POA: Diagnosis not present

## 2022-10-31 DIAGNOSIS — H01004 Unspecified blepharitis left upper eyelid: Secondary | ICD-10-CM | POA: Diagnosis not present

## 2022-10-31 NOTE — Telephone Encounter (Signed)
Corrie Dandy (wife) called and states she thinks Shaun Kennedy memory pills need to be changed or increased.  She thinks he is going backwards instead of getting better.  He has been taking them for atleast 6 month.  You saw patient in Aug. Please advise.  I let her know you were out of country so there may be a delay in response.

## 2022-11-08 ENCOUNTER — Encounter: Payer: Self-pay | Admitting: Family Medicine

## 2022-11-09 NOTE — Telephone Encounter (Signed)
Patient coming in 11/17/22

## 2022-11-17 ENCOUNTER — Encounter: Payer: Self-pay | Admitting: Family Medicine

## 2022-11-17 ENCOUNTER — Ambulatory Visit: Payer: Medicare HMO | Admitting: Family Medicine

## 2022-11-17 VITALS — BP 126/68 | HR 63 | Wt 189.2 lb

## 2022-11-17 DIAGNOSIS — G3184 Mild cognitive impairment, so stated: Secondary | ICD-10-CM | POA: Diagnosis not present

## 2022-11-17 MED ORDER — MEMANTINE HCL 5 MG PO TABS
5.0000 mg | ORAL_TABLET | Freq: Two times a day (BID) | ORAL | 1 refills | Status: DC
Start: 2022-11-17 — End: 2023-02-09

## 2022-11-17 NOTE — Patient Instructions (Signed)
Send me a message on MyChart as to how you are doing in about a month.

## 2022-11-17 NOTE — Progress Notes (Signed)
Subjective:    Patient ID: Mariah Milling, male    DOB: 10/20/1940, 82 y.o.   MRN: [id number]  HPI  He is here for follow-up.  He states that he is still having new issues.  He is taking Aricept and having no difficulty with that.  He follows up with Dr. Jenne Pane and with Dr. Arbutus Ped concerning his underlying trach and lung cancer.  Review of Systems     Objective:    Physical Exam Alert and in no distress.  Oriented x3       Assessment & Plan:   Problem List Items Addressed This Visit     Mild cognitive impairment - Primary   Relevant Medications   memantine (NAMENDA) 5 MG tablet  He is to call me in roughly 3 weeks to let me know how he is doing.

## 2022-11-22 ENCOUNTER — Telehealth: Payer: Self-pay | Admitting: Family Medicine

## 2022-11-22 NOTE — Telephone Encounter (Signed)
Wife left message on voice mail that she would like nurse or doctor to call her and let her know what is going on with her husband who recently had a visit.  (Ms. Gains was asked to join in at the visit when we made the original appointment.)  Please call wife [phone]  Wife is on 2022 HIPAA

## 2022-11-22 NOTE — Telephone Encounter (Signed)
Returned call to pt wife and gave her info.  I expressed that we were wanting her here for the appointment.  She said yes I know, HE left me.  She was at home and thought he was going to store and coming back and he never did.  He came on to the appointment.

## 2022-12-07 ENCOUNTER — Inpatient Hospital Stay: Payer: Medicare HMO | Attending: Internal Medicine

## 2022-12-07 DIAGNOSIS — Z85118 Personal history of other malignant neoplasm of bronchus and lung: Secondary | ICD-10-CM | POA: Insufficient documentation

## 2022-12-07 DIAGNOSIS — Z95828 Presence of other vascular implants and grafts: Secondary | ICD-10-CM

## 2022-12-07 DIAGNOSIS — Z452 Encounter for adjustment and management of vascular access device: Secondary | ICD-10-CM | POA: Diagnosis not present

## 2022-12-07 MED ORDER — HEPARIN SOD (PORK) LOCK FLUSH 100 UNIT/ML IV SOLN
500.0000 [IU] | Freq: Once | INTRAVENOUS | Status: AC
  Administered 2022-12-07: 500 [IU]

## 2022-12-07 MED ORDER — SODIUM CHLORIDE 0.9% FLUSH
10.0000 mL | Freq: Once | INTRAVENOUS | Status: AC
  Administered 2022-12-07: 10 mL

## 2022-12-15 ENCOUNTER — Inpatient Hospital Stay (HOSPITAL_COMMUNITY)
Admission: RE | Admit: 2022-12-15 | Discharge: 2022-12-15 | Disposition: A | Discharge status: Home / Self Care | Payer: Medicare HMO | Source: Ambulatory Visit

## 2022-12-30 ENCOUNTER — Ambulatory Visit (HOSPITAL_COMMUNITY)
Admission: RE | Admit: 2022-12-30 | Discharge: 2022-12-30 | Disposition: A | Discharge status: Home / Self Care | Payer: Medicare HMO | Source: Ambulatory Visit | Attending: Pulmonary Disease | Admitting: Pulmonary Disease

## 2022-12-30 DIAGNOSIS — J386 Stenosis of larynx: Secondary | ICD-10-CM | POA: Diagnosis present

## 2022-12-30 DIAGNOSIS — J383 Other diseases of vocal cords: Secondary | ICD-10-CM | POA: Diagnosis present

## 2022-12-30 DIAGNOSIS — Z930 Tracheostomy status: Secondary | ICD-10-CM

## 2022-12-30 NOTE — Progress Notes (Signed)
Tracheostomy Procedure Note  Shaun Kennedy [id number] [birth year]  Pre Procedure Tracheostomy Information  Trach Brand: Shiley Size:  6.0 9JJ88C Style: Uncuffed Secured by: Velcro   Procedure: Trach cleaning and trach change    Post Procedure Tracheostomy Information  Trach Brand: Shiley Size:  6.0  [id number] Style: Uncuffed Secured by: Velcro   Post Procedure Evaluation:  ETCO2 positive color change from yellow to purple : Yes.   Vital signs: VSS Patients current condition: stable Complications: No apparent complications Trach site exam: clean, dry Wound care done: 4 x 4 gauze drain gauze Patient did tolerate procedure well.   Education: none  Prescription needs: none    Additional needs: New PMV given to patient at this visit.

## 2022-12-30 NOTE — Progress Notes (Signed)
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  I saw him last 9/19. He continues to follow w/ Onc. He has completed palliative XRT 2021, Last CT imaging raised concern for possible mets to bowel he completed  22 cycles of chemo in Jan 2024. Now on observation and thriving. Last CT imaging did NOT suggest further disease progression.  Presents today for planned trach change back in July. Comes in for planned trach change   Review of Systems  Constitutional:  Negative for chills, fever and malaise/fatigue.  HENT:  Negative for congestion.   Eyes: Negative.   Respiratory:  Negative for cough, sputum production and shortness of breath.   Cardiovascular: Negative.   Gastrointestinal: Negative.   Genitourinary: Negative.   Musculoskeletal: Negative.   All other systems reviewed and are negative.  Exam  General ambulatory into clinic. No distress HENT NCAT 6 trach site unremarkable good phonation quality Pulm clear Card rrr Abd soft Ext warm and dry  Neuro intact    Procedure  The # 6 trach was removed. Site inspected. Janina Mayo had as expected biofilm. New trach site placed over obturator. Site verified via ETCo2. No secretions. Biofilm noted as above.   Impression/plan  Active Problems:   Tracheostomy status (HCC)   Glottic stenosis   Vocal cord dysfunction   Tracheostomy status (HCC) Overview:  Trach Brand: Shiley Size:  6.0 [id number] Style: Uncuffed Last change before today 7/19  Discussion Stable trach, not a candidate for trach decannulation d/t subglottic stenosis  Plan Cont routine trach care ROV 12 weeks         My time 15 min  Simonne Martinet ACNP-BC [facility] Pulmonary/Critical Care Pager # [phone] OR # [phone] if no answer

## 2023-01-02 DIAGNOSIS — H01001 Unspecified blepharitis right upper eyelid: Secondary | ICD-10-CM | POA: Diagnosis not present

## 2023-01-02 DIAGNOSIS — H04123 Dry eye syndrome of bilateral lacrimal glands: Secondary | ICD-10-CM | POA: Diagnosis not present

## 2023-01-02 DIAGNOSIS — H01004 Unspecified blepharitis left upper eyelid: Secondary | ICD-10-CM | POA: Diagnosis not present

## 2023-01-02 DIAGNOSIS — B880 Other acariasis: Secondary | ICD-10-CM | POA: Diagnosis not present

## 2023-01-06 ENCOUNTER — Ambulatory Visit (HOSPITAL_COMMUNITY)
Admission: RE | Admit: 2023-01-06 | Discharge: 2023-01-06 | Disposition: A | Discharge status: Home / Self Care | Payer: Medicare HMO | Source: Ambulatory Visit | Attending: Physician Assistant | Admitting: Physician Assistant

## 2023-01-06 DIAGNOSIS — C3490 Malignant neoplasm of unspecified part of unspecified bronchus or lung: Secondary | ICD-10-CM | POA: Insufficient documentation

## 2023-01-06 DIAGNOSIS — K5730 Diverticulosis of large intestine without perforation or abscess without bleeding: Secondary | ICD-10-CM | POA: Diagnosis not present

## 2023-01-06 MED ORDER — IOHEXOL 300 MG/ML  SOLN
30.0000 mL | Freq: Once | INTRAMUSCULAR | Status: AC | PRN
  Administered 2023-01-06: 30 mL via ORAL

## 2023-01-06 MED ORDER — IOHEXOL 300 MG/ML  SOLN: 100.0000 mL | Freq: Once | INTRAMUSCULAR | Status: DC | PRN

## 2023-01-11 ENCOUNTER — Other Ambulatory Visit: Payer: Self-pay | Admitting: Family Medicine

## 2023-01-11 DIAGNOSIS — E119 Type 2 diabetes mellitus without complications: Secondary | ICD-10-CM

## 2023-01-23 ENCOUNTER — Telehealth: Payer: Self-pay | Admitting: Internal Medicine

## 2023-01-23 NOTE — Telephone Encounter (Signed)
Patient is aware of scheduled appointment times/dates

## 2023-01-29 ENCOUNTER — Emergency Department (HOSPITAL_COMMUNITY)
Admission: EM | Admit: 2023-01-29 | Discharge: 2023-01-29 | Disposition: A | Discharge status: Home / Self Care | Payer: Medicare HMO | Attending: Emergency Medicine | Admitting: Emergency Medicine

## 2023-01-29 ENCOUNTER — Encounter (HOSPITAL_COMMUNITY): Payer: Self-pay

## 2023-01-29 ENCOUNTER — Other Ambulatory Visit: Payer: Self-pay

## 2023-01-29 DIAGNOSIS — J9509 Other tracheostomy complication: Secondary | ICD-10-CM | POA: Diagnosis not present

## 2023-01-29 DIAGNOSIS — J9500 Unspecified tracheostomy complication: Secondary | ICD-10-CM | POA: Insufficient documentation

## 2023-01-29 DIAGNOSIS — J9503 Malfunction of tracheostomy stoma: Secondary | ICD-10-CM

## 2023-01-29 DIAGNOSIS — Z430 Encounter for attention to tracheostomy: Secondary | ICD-10-CM | POA: Diagnosis not present

## 2023-01-29 DIAGNOSIS — Z7982 Long term (current) use of aspirin: Secondary | ICD-10-CM | POA: Insufficient documentation

## 2023-01-29 NOTE — ED Provider Notes (Signed)
[place] EMERGENCY DEPARTMENT AT [facility] Provider Note   CSN: [id number] Arrival date & time: 01/29/23  0009     History  Chief Complaint  Patient presents with   Tracheostomy Tube Change    Shaun Kennedy is a 82 y.o. male.  Accidentally dislodged trach canula. No trauma, no difficulty breathing.       Home Medications Prior to Admission medications   Medication Sig Start Date End Date Taking? Authorizing Provider  Alcohol Swabs (DROPSAFE ALCOHOL PREP) 70 % PADS Apply topically. 08/26/20   [provider]  amLODipine (NORVASC) 5 MG tablet Take 1 tablet (5 mg total) by mouth daily. 04/21/22   Ronnald Nian, MD  aspirin EC 81 MG tablet Take 81 mg by mouth daily after breakfast.     [provider]  bisacodyl (DULCOLAX) 5 MG EC tablet Take 10 mg by mouth daily as needed for moderate constipation.    [provider]  Blood Glucose Monitoring Suppl DEVI 1 each by Does not apply route in the morning, at noon, and at bedtime. May substitute to any manufacturer covered by patient's insurance. 10/05/22   Ronnald Nian, MD  cholecalciferol (VITAMIN D) 25 MCG (1000 UNIT) tablet Take 1,000 Units by mouth daily.    [provider]  donepezil (ARICEPT) 10 MG tablet Take 1 tablet (10 mg total) by mouth at bedtime. 06/23/22   Tysinger, Kermit Balo, PA-C  ferrous sulfate 325 (65 FE) MG EC tablet TAKE 1 TABLET (325 MG TOTAL) DAILY WITH BREAKFAST. 11/10/21   Ronnald Nian, MD  gabapentin (NEURONTIN) 100 MG capsule Take by mouth. 12/03/19   [provider]  hydrOXYzine (ATARAX) 10 MG tablet Take 1 tablet (10 mg total) by mouth 3 (three) times daily as needed. 10/13/21   Si Gaul, MD  lisinopril-hydrochlorothiazide (ZESTORETIC) 20-12.5 MG tablet Take 1 tablet by mouth daily. 04/21/22   Ronnald Nian, MD  memantine (NAMENDA) 5 MG tablet Take 1 tablet (5 mg total) by mouth 2 (two) times daily. 11/17/22   Ronnald Nian, MD  metFORMIN  (GLUCOPHAGE-XR) 500 MG 24 hr tablet TAKE 1 TABLET (500 MG TOTAL) BY MOUTH DAILY. 01/11/23   Ronnald Nian, MD  Multiple Vitamin (MULTIVITAMIN WITH MINERALS) TABS tablet Take 1 tablet by mouth daily after breakfast.     [provider]  simvastatin (ZOCOR) 40 MG tablet TAKE 1 TABLET EVERY DAY 03/07/22   Ronnald Nian, MD  solifenacin (VESICARE) 5 MG tablet TAKE 1 TABLET EVERY DAY 08/17/20   Ronnald Nian, MD      Allergies    Patient has no known allergies.    Review of Systems   Review of Systems  Physical Exam Updated Vital Signs BP 138/75 (BP Location: Right Arm)   Pulse 69   Temp 97.6 F (36.4 C) (Oral)   Resp 19   SpO2 99%  Physical Exam Vitals and nursing note reviewed.  Constitutional:      General: He is not in acute distress.    Appearance: He is well-developed.  HENT:     Head: Normocephalic and atraumatic.     Nose:     Comments: Stoma intact, no bleeding, no drainage.    Mouth/Throat:     Mouth: Mucous membranes are moist.  Eyes:     General: Vision grossly intact. Gaze aligned appropriately.     Extraocular Movements: Extraocular movements intact.     Conjunctiva/sclera: Conjunctivae normal.  Cardiovascular:  Rate and Rhythm: Normal rate and regular rhythm.     Pulses: Normal pulses.     Heart sounds: Normal heart sounds, S1 normal and S2 normal. No murmur heard.    No friction rub. No gallop.  Pulmonary:     Effort: Pulmonary effort is normal. No respiratory distress.     Breath sounds: Normal breath sounds.  Abdominal:     Palpations: Abdomen is soft.     Tenderness: There is no abdominal tenderness. There is no guarding or rebound.     Hernia: No hernia is present.  Musculoskeletal:        General: No swelling.     Cervical back: Full passive range of motion without pain, normal range of motion and neck supple. No pain with movement, spinous process tenderness or muscular tenderness. Normal range of motion.     Right lower leg: No edema.      Left lower leg: No edema.  Skin:    General: Skin is warm and dry.     Capillary Refill: Capillary refill takes less than 2 seconds.     Findings: No ecchymosis, erythema, lesion or wound.  Neurological:     Mental Status: He is alert and oriented to person, place, and time.     GCS: GCS eye subscore is 4. GCS verbal subscore is 5. GCS motor subscore is 6.     Cranial Nerves: Cranial nerves 2-12 are intact.     Sensory: Sensation is intact.     Motor: Motor function is intact. No weakness or abnormal muscle tone.     Coordination: Coordination is intact.  Psychiatric:        Mood and Affect: Mood normal.        Speech: Speech normal.        Behavior: Behavior normal.     ED Results / Procedures / Treatments   Labs (all labs ordered are listed, but only abnormal results are displayed) Labs Reviewed - No data to display  EKG None  Radiology No results found.  Procedures TRACHEOSTOMY REPLACEMENT  Date/Time: 01/29/2023 12:53 AM  Performed by: Gilda Crease, MD Authorized by: Gilda Crease, MD  Consent: Verbal consent obtained. Risks and benefits: risks, benefits and alternatives were discussed Consent given by: patient Patient understanding: patient states understanding of the procedure being performed Patient consent: the patient's understanding of the procedure matches consent given Procedure consent: procedure consent matches procedure scheduled Relevant documents: relevant documents present and verified Test results: test results available and properly labeled Site marked: the operative site was marked Imaging studies: imaging studies available Required items: required blood products, implants, devices, and special equipment available Patient identity confirmed: verbally with patient Time out: Immediately prior to procedure a "time out" was called to verify the correct patient, procedure, equipment, support staff and site/side marked as  required. Indications: became dislodged Local anesthesia used: no  Anesthesia: Local anesthesia used: no  Sedation: Patient sedated: no  Preparation: Patient was prepped and draped in the usual sterile fashion. Tube type: double cannula Tube cuff: cuffless Tube size: 6.0 mm Comments: Cannula easily placed using introducer, inner cannula placed - good color change on ez-cap       Medications Ordered in ED Medications - No data to display  ED Course/ Medical Decision Making/ A&P  Medical Decision Making  Presents for tracheostomy cannula placement.  He accidentally dislodged his tube when he was trying to replace the inner cannula.  He was in no distress at arrival.  Device was easily replaced without complication.        Final Clinical Impression(s) / ED Diagnoses Final diagnoses:  Tracheostomy malfunction [facility])    Rx / DC Orders ED Discharge Orders     None         Khylee Algeo, Canary Brim, MD 01/29/23 [phone]

## 2023-01-29 NOTE — ED Notes (Signed)
PT WILL REMAIN IN HALLWAY UNTIL LEGAL GUARDIAN ARRIVES.

## 2023-01-29 NOTE — ED Triage Notes (Signed)
Wife states patient trach came out about 1 hour ago. States this has happened before. No respiratory distress at this time.

## 2023-01-30 ENCOUNTER — Telehealth: Payer: Self-pay

## 2023-01-30 NOTE — Transitions of Care (Post Inpatient/ED Visit) (Signed)
 01/30/2023  Name: Shaun Kennedy MRN: [id number] DOB: 23-Oct-1940  Today's TOC FU Call Status: Today's TOC FU Call Status:: Successful TOC FU Call Completed TOC FU Call Complete Date: 01/30/23 Patient's Name and Date of Birth confirmed.  Transition Care Management Follow-up Telephone Call Date of Discharge: 01/29/23 Discharge Facility: [facility]) Type of Discharge: Emergency Department Reason for ED Visit: Other: (malfunction of tracheostomy stoma) How have you been since you were released from the hospital?: Better Any questions or concerns?: No  Items Reviewed: Did you receive and understand the discharge instructions provided?: Yes Medications obtained,verified, and reconciled?: Yes (Medications Reviewed) Any new allergies since your discharge?: No Dietary orders reviewed?: NA Do you have support at home?: Yes People in Home: spouse  Medications Reviewed Today: Medications Reviewed Today     Reviewed by Cianna Kasparian, Jordan Hawks, CMA (Certified Medical Assistant) on 01/30/23 at 1523  Med List Status: <None>   Medication Order Taking? Sig Documenting Provider Last Dose Status Informant  Alcohol Swabs (DROPSAFE ALCOHOL PREP) 70 % PADS [id number] No Apply topically. [provider] Taking Active Spouse/Significant Other  amLODipine (NORVASC) 5 MG tablet [id number] No Take 1 tablet (5 mg total) by mouth daily. Ronnald Nian, MD Taking Active   aspirin EC 81 MG tablet [id number] No Take 81 mg by mouth daily after breakfast.  [provider] Taking Active Spouse/Significant Other  bisacodyl (DULCOLAX) 5 MG EC tablet [id number] No Take 10 mg by mouth daily as needed for moderate constipation. [provider] Taking Active Spouse/Significant Other           Med Note [place] Oct 05, 2022 10:51 AM) As needed  Blood Glucose Monitoring Suppl DEVI [id number] No 1 each by Does not apply route in the morning, at noon, and at bedtime. May substitute to  any manufacturer covered by patient's insurance. Ronnald Nian, MD Taking Active   cholecalciferol (VITAMIN D) 25 MCG (1000 UNIT) tablet [id number] No Take 1,000 Units by mouth daily. [provider] Taking Active Spouse/Significant Other  donepezil (ARICEPT) 10 MG tablet [id number] No Take 1 tablet (10 mg total) by mouth at bedtime. Tysinger, Kermit Balo, PA-C Taking Active   ferrous sulfate 325 (65 FE) MG EC tablet [id number] No TAKE 1 TABLET (325 MG TOTAL) DAILY WITH BREAKFAST. Ronnald Nian, MD Taking Active   gabapentin (NEURONTIN) 100 MG capsule [id number] No Take by mouth. [provider] Taking Active            Med Note Katrinka Blazing, Corliss Blacker Oct 05, 2022 10:51 AM) As needed  hydrOXYzine (ATARAX) 10 MG tablet [id number] No Take 1 tablet (10 mg total) by mouth 3 (three) times daily as needed. Si Gaul, MD Taking Active            Med Note Katrinka Blazing, Corliss Blacker Oct 05, 2022 10:50 AM) As needed  lisinopril-hydrochlorothiazide (ZESTORETIC) 20-12.5 MG tablet [id number] No Take 1 tablet by mouth daily. Ronnald Nian, MD Taking Active   memantine [facility]) 5 MG tablet [id number]  Take 1 tablet (5 mg total) by mouth 2 (two) times daily. Ronnald Nian, MD  Active   metFORMIN (GLUCOPHAGE-XR) 500 MG 24 hr tablet [id number]  TAKE 1 TABLET (500 MG TOTAL) BY MOUTH DAILY. Ronnald Nian, MD  Active   Multiple Vitamin (MULTIVITAMIN WITH MINERALS) TABS tablet [id number] No Take 1 tablet by mouth daily after breakfast.  [provider] Taking Active Spouse/Significant  Other  simvastatin (ZOCOR) 40 MG tablet [id number] No TAKE 1 TABLET EVERY DAY Ronnald Nian, MD Taking Active   solifenacin (VESICARE) 5 MG tablet [id number] No TAKE 1 TABLET EVERY DAY Ronnald Nian, MD Taking Active Spouse/Significant Other            Home Care and Equipment/Supplies: Were Home Health Services Ordered?: NA Any new equipment or medical supplies ordered?: NA  Functional  Questionnaire: Do you need assistance with bathing/showering or dressing?: No Do you need assistance with meal preparation?: No Do you need assistance with eating?: No Do you have difficulty maintaining continence: No Do you need assistance with getting out of bed/getting out of a chair/moving?: No Do you have difficulty managing or taking your medications?: No  Follow up appointments reviewed: PCP Follow-up appointment confirmed?: No (pt and spouse going out of town for the holiday. Has a previously scheduled appt with pcp in early January) MD Provider Line [phone] Given: Yes Specialist Hospital Follow-up appointment confirmed?: NA Do you need transportation to your follow-up appointment?: No Do you understand care options if your condition(s) worsen?: Yes-patient verbalized understanding     Quantia Grullon, CMA  [facility] AWV Team Direct Dial: [phone]

## 2023-02-02 ENCOUNTER — Encounter: Payer: Medicare HMO | Admitting: Family Medicine

## 2023-02-07 ENCOUNTER — Inpatient Hospital Stay: Payer: Medicare HMO | Admitting: Internal Medicine

## 2023-02-07 ENCOUNTER — Inpatient Hospital Stay: Payer: Medicare HMO

## 2023-02-09 ENCOUNTER — Ambulatory Visit (INDEPENDENT_AMBULATORY_CARE_PROVIDER_SITE_OTHER): Payer: Medicare HMO | Admitting: Family Medicine

## 2023-02-09 ENCOUNTER — Inpatient Hospital Stay: Payer: Medicare HMO | Admitting: Internal Medicine

## 2023-02-09 ENCOUNTER — Encounter: Payer: Self-pay | Admitting: Family Medicine

## 2023-02-09 ENCOUNTER — Inpatient Hospital Stay: Payer: Medicare HMO | Attending: Internal Medicine

## 2023-02-09 VITALS — BP 145/68 | HR 58 | Temp 98.1°F | Resp 17 | Ht 67.0 in | Wt 189.2 lb

## 2023-02-09 VITALS — BP 132/80 | HR 78 | Wt 188.4 lb

## 2023-02-09 DIAGNOSIS — Z85118 Personal history of other malignant neoplasm of bronchus and lung: Secondary | ICD-10-CM | POA: Diagnosis not present

## 2023-02-09 DIAGNOSIS — Z430 Encounter for attention to tracheostomy: Secondary | ICD-10-CM

## 2023-02-09 DIAGNOSIS — Z452 Encounter for adjustment and management of vascular access device: Secondary | ICD-10-CM | POA: Insufficient documentation

## 2023-02-09 DIAGNOSIS — C3492 Malignant neoplasm of unspecified part of left bronchus or lung: Secondary | ICD-10-CM

## 2023-02-09 DIAGNOSIS — C3490 Malignant neoplasm of unspecified part of unspecified bronchus or lung: Secondary | ICD-10-CM

## 2023-02-09 DIAGNOSIS — E119 Type 2 diabetes mellitus without complications: Secondary | ICD-10-CM | POA: Diagnosis not present

## 2023-02-09 DIAGNOSIS — G3184 Mild cognitive impairment, so stated: Secondary | ICD-10-CM

## 2023-02-09 DIAGNOSIS — Z95828 Presence of other vascular implants and grafts: Secondary | ICD-10-CM

## 2023-02-09 LAB — CBC WITH DIFFERENTIAL (CANCER CENTER ONLY)
Abs Immature Granulocytes: 0.01 10*3/uL (ref 0.00–0.07)
Basophils Absolute: 0 10*3/uL (ref 0.0–0.1)
Basophils Relative: 0 %
Eosinophils Absolute: 0.8 10*3/uL — ABNORMAL HIGH (ref 0.0–0.5)
Eosinophils Relative: 13 %
HCT: 29.9 % — ABNORMAL LOW (ref 39.0–52.0)
Hemoglobin: 10.5 g/dL — ABNORMAL LOW (ref 13.0–17.0)
Immature Granulocytes: 0 %
Lymphocytes Relative: 19 %
Lymphs Abs: 1.2 10*3/uL (ref 0.7–4.0)
MCH: 32.6 pg (ref 26.0–34.0)
MCHC: 35.1 g/dL (ref 30.0–36.0)
MCV: 92.9 fL (ref 80.0–100.0)
Monocytes Absolute: 0.7 10*3/uL (ref 0.1–1.0)
Monocytes Relative: 11 %
Neutro Abs: 3.4 10*3/uL (ref 1.7–7.7)
Neutrophils Relative %: 57 %
Platelet Count: 269 10*3/uL (ref 150–400)
RBC: 3.22 MIL/uL — ABNORMAL LOW (ref 4.22–5.81)
RDW: 12.2 % (ref 11.5–15.5)
WBC Count: 6 10*3/uL (ref 4.0–10.5)
nRBC: 0 % (ref 0.0–0.2)

## 2023-02-09 LAB — CMP (CANCER CENTER ONLY)
ALT: 13 U/L (ref 0–44)
AST: 20 U/L (ref 15–41)
Albumin: 3.7 g/dL (ref 3.5–5.0)
Alkaline Phosphatase: 50 U/L (ref 38–126)
Anion gap: 5 (ref 5–15)
BUN: 25 mg/dL — ABNORMAL HIGH (ref 8–23)
CO2: 28 mmol/L (ref 22–32)
Calcium: 9.3 mg/dL (ref 8.9–10.3)
Chloride: 107 mmol/L (ref 98–111)
Creatinine: 1.4 mg/dL — ABNORMAL HIGH (ref 0.61–1.24)
GFR, Estimated: 50 mL/min — ABNORMAL LOW (ref >60)
Glucose, Bld: 105 mg/dL — ABNORMAL HIGH (ref 70–99)
Potassium: 4.1 mmol/L (ref 3.5–5.1)
Sodium: 140 mmol/L (ref 135–145)
Total Bilirubin: 0.5 mg/dL (ref 0.0–1.2)
Total Protein: 8 g/dL (ref 6.5–8.1)

## 2023-02-09 LAB — POCT GLYCOSYLATED HEMOGLOBIN (HGB A1C): Hemoglobin A1C: 6.2 % — AB (ref 4.0–5.6)

## 2023-02-09 MED ORDER — DONEPEZIL HCL 10 MG PO TABS: 10.0000 mg | ORAL_TABLET | Freq: Every day | ORAL | 1 refills | Status: DC

## 2023-02-09 MED ORDER — BLOOD GLUCOSE MONITORING SUPPL DEVI: 1.0000 | Freq: Three times a day (TID) | 0 refills | Status: DC

## 2023-02-09 MED ORDER — SODIUM CHLORIDE 0.9% FLUSH
10.0000 mL | Freq: Once | INTRAVENOUS | Status: AC
  Administered 2023-02-09: 10 mL

## 2023-02-09 MED ORDER — HEPARIN SOD (PORK) LOCK FLUSH 100 UNIT/ML IV SOLN
500.0000 [IU] | Freq: Once | INTRAVENOUS | Status: AC
  Administered 2023-02-09: 500 [IU]

## 2023-02-09 MED ORDER — MEMANTINE HCL 10 MG PO TABS: 10.0000 mg | ORAL_TABLET | Freq: Two times a day (BID) | ORAL | 1 refills | Status: DC

## 2023-02-09 NOTE — Patient Instructions (Signed)
 Use the over-the-counter cortisone cream sparingly roughly twice per day to see if that will help with the itching

## 2023-02-09 NOTE — Progress Notes (Deleted)
   Subjective:    Patient ID: Shaun Kennedy, male    DOB: January 14, 1941, 83 y.o.   MRN: [id number]  HPI He is here for an interval evaluation.  One of his daughters is here with him.  Apparently he has not been taking his meds appropriately.  She was on the line with one of her other sisters.  His Aricept  and Namenda  was renewed and I reinforced the need for him to take the medication as prescribed.  The sisters of all noted evidence of cognitive impairment.  He also complains of itching mainly on his forearms.  Does have a previous history of eczema.  They have been using OTC meds without much success.  He continues on Norvasc  as well as Zestoretic  without difficulty.  He has however been having difficulty with excessive mucus production causing an interference with his tracheostomy.  They would like some help with this.  They are also concerned about home care for him since his just he and his wife and the 2 sisters live out of town.   Review of Systems     Objective:    Physical Exam Alert and in no distress.  Excessive mucus production was evident.  He was able to clear his tracheostomy tube fairly easily. Hemoglobin A1c is 6.3.     Assessment & Plan:  Diabetes mellitus type II, non insulin  dependent (HCC) - Plan: POCT glycosylated hemoglobin (Hb A1C), Blood Glucose Monitoring Suppl DEVI  Non-small cell cancer of left lung (HCC)  Mild cognitive impairment - Plan: donepezil  (ARICEPT ) 10 MG tablet, memantine  (NAMENDA ) 10 MG tablet, Ambulatory referral to Home Health  Tracheostomy care [facility]) - Plan: Ambulatory referral to ENT, Ambulatory referral to Home Health I went over proper medication regimen with him and his daughter.  They will work with him on getting this squared away.  Will also refer to ENT to see if that can help with his excessive production which is interfering with the tracheostomy.  Will also get home health to come by and see what other needs that he might have.

## 2023-02-09 NOTE — Progress Notes (Signed)
 [facility] Telephone:([phone]) [phone]   Fax:(336) [phone]  OFFICE PROGRESS NOTE  Shaun Norleen BROCKS, MD [address] [place] [address]  DIAGNOSIS: stage IV (T3, N2, M1 a) non-small cell lung cancer, squamous cell carcinoma presented with obstructive left lower lobe lung mass in addition to mediastinal lymphadenopathy as well as bilateral pulmonary nodules diagnosed in July 2021.   PDL1: 0%   PRIOR THERAPY:  1) Palliative radiotherapy to the obstructive left lower lobe lung mass under the care of Dr. Dewey.  2)  systemic chemotherapy 2 cycles of chemotherapy with carboplatin  for an AUC of 5 and paclitaxel  175 mg/m2 in addition to immunotherapy with nivolumab  360 mg every 3 weeks and ipilimumab  1 mg/kg IV every 6 weeks followed by maintenance nivolumab  and ipilimumab .  He started the first treatment on 09/04/2019.  He is status post 22 cycles.  Last was giving on February 16, 2022.  This treatment was discontinued after the patient completed more than 2 years on the immunotherapy.   CURRENT THERAPY: Observation  INTERVAL HISTORY: Shaun Kennedy 83 y.o. male returns to the clinic today for follow-up visit. Discussed the use of AI scribe software for clinical note transcription with the patient, who gave verbal consent to proceed.  History of Present Illness   The patient, previously on immune therapy for two years, has been off treatment for almost a year due to satisfactory progress and has been under observation since. Over the past several months, the patient reports occasional episodes of shortness of breath, which he attributes to a potentially blocked tracheostomy tube. He plans to consult with Dr. Carlie for possible cleaning or unclogging of the tube. Despite this, the patient maintains an active lifestyle, including playing golf. He denies any recent weight loss, night sweats, nausea, vomiting, diarrhea, or chest pain. A recent scan of the chest, abdomen, and  pelvis showed no signs of disease progression or spread.      MEDICAL HISTORY: Past Medical History:  Diagnosis Date   Allergic rhinitis    Asthma    Carotid stenosis    Colon cancer (HCC) 2003   Diabetes mellitus (HCC)    Diverticulosis    Dyslipidemia    ED (erectile dysfunction)    GERD (gastroesophageal reflux disease)    H/O degenerative disc disease    Hemorrhoids    HTN (hypertension)    as a child   Hyperlipidemia    Lung cancer (HCC)    LVH (left ventricular hypertrophy)    on EKG   Mass of lower lobe of left lung    Mediastinal adenopathy    Smoker    former   Wears dentures    Wears dentures    Wears glasses    Wears glasses     ALLERGIES:  has no known allergies.  MEDICATIONS:  Current Outpatient Medications  Medication Sig Dispense Refill   Alcohol  Swabs (DROPSAFE ALCOHOL  PREP) 70 % PADS Apply topically.     amLODipine  (NORVASC ) 5 MG tablet Take 1 tablet (5 mg total) by mouth daily. 90 tablet 3   aspirin  EC 81 MG tablet Take 81 mg by mouth daily after breakfast.      bisacodyl (DULCOLAX) 5 MG EC tablet Take 10 mg by mouth daily as needed for moderate constipation.     Blood Glucose Monitoring Suppl DEVI 1 each by Does not apply route in the morning, at noon, and at bedtime. May substitute to any manufacturer covered by patient's  insurance. 1 each 0   cholecalciferol  (VITAMIN D ) 25 MCG (1000 UNIT) tablet Take 1,000 Units by mouth daily.     donepezil  (ARICEPT ) 10 MG tablet Take 1 tablet (10 mg total) by mouth at bedtime. 90 tablet 1   ferrous sulfate  325 (65 FE) MG EC tablet TAKE 1 TABLET (325 MG TOTAL) DAILY WITH BREAKFAST. 90 tablet 1   gabapentin  (NEURONTIN ) 100 MG capsule Take by mouth.     hydrOXYzine  (ATARAX ) 10 MG tablet Take 1 tablet (10 mg total) by mouth 3 (three) times daily as needed. 90 tablet 1   lisinopril -hydrochlorothiazide  (ZESTORETIC ) 20-12.5 MG tablet Take 1 tablet by mouth daily. 90 tablet 3   memantine  (NAMENDA ) 10 MG tablet Take 1  tablet (10 mg total) by mouth 2 (two) times daily. 90 tablet 1   metFORMIN  (GLUCOPHAGE -XR) 500 MG 24 hr tablet TAKE 1 TABLET (500 MG TOTAL) BY MOUTH DAILY. 90 tablet 0   Multiple Vitamin (MULTIVITAMIN WITH MINERALS) TABS tablet Take 1 tablet by mouth daily after breakfast.      simvastatin  (ZOCOR ) 40 MG tablet TAKE 1 TABLET EVERY DAY 90 tablet 1   solifenacin  (VESICARE ) 5 MG tablet TAKE 1 TABLET EVERY DAY 90 tablet 1   No current facility-administered medications for this visit.    SURGICAL HISTORY:  Past Surgical History:  Procedure Laterality Date   BRONCHIAL BIOPSY  08/20/2019   Procedure: BRONCHIAL BIOPSIES;  Surgeon: Shelah Lamar RAMAN, MD;  Location: [facility] ENDOSCOPY;  Service: Pulmonary;;   BRONCHIAL BRUSHINGS  08/20/2019   Procedure: BRONCHIAL BRUSHINGS;  Surgeon: Shelah Lamar RAMAN, MD;  Location: [facility] ENDOSCOPY;  Service: Pulmonary;;   BRONCHIAL NEEDLE ASPIRATION BIOPSY  08/20/2019   Procedure: BRONCHIAL NEEDLE ASPIRATION BIOPSIES;  Surgeon: Shelah Lamar RAMAN, MD;  Location: [facility] ENDOSCOPY;  Service: Pulmonary;;   CARPAL TUNNEL RELEASE Right 01/09/2019   Procedure: CARPAL TUNNEL RELEASE;  Surgeon: Jerri Kay HERO, MD;  Location: [facility];  Service: Orthopedics;  Laterality: Right;   CARPAL TUNNEL WITH CUBITAL TUNNEL Right 11/18/2020   Procedure: CARPAL TUNNEL WITH CUBITAL TUNNEL;  Surgeon: Sissy Cough, MD;  Location: [facility] OR;  Service: Orthopedics;  Laterality: Right;   CATARACT EXTRACTION Right 2018   COLONOSCOPY  2007   Dr. Rollin   DIRECT LARYNGOSCOPY N/A 04/10/2020   Procedure: DIRECT LARYNGOSCOPY;  Surgeon: Carlie Clark, MD;  Location: [facility] OR;  Service: ENT;  Laterality: N/A;   I & D EXTREMITY Right 04/02/2016   Procedure: IRRIGATION AND DEBRIDEMENT GREAT TOE;  Surgeon: Kay HERO Jerri, MD;  Location: [facility] OR;  Service: Orthopedics;  Laterality: Right;   IR IMAGING GUIDED PORT INSERTION  08/30/2019   MULTIPLE TOOTH EXTRACTIONS     RADIOACTIVE SEED IMPLANT  2003   TRACHEOSTOMY TUBE  PLACEMENT N/A 04/10/2020   Procedure: TRACHEOSTOMY;  Surgeon: Carlie Clark, MD;  Location: [facility] OR;  Service: ENT;  Laterality: N/A;   ULNAR TUNNEL RELEASE Right 01/09/2019   Procedure: RIGHT CUBITAL TUNNEL RELEASE AND CARPAL TUNNEL RELEASE;  Surgeon: Jerri Kay HERO, MD;  Location:  SURGERY CENTER;  Service: Orthopedics;  Laterality: Right;   UPPER GASTROINTESTINAL ENDOSCOPY     VIDEO BRONCHOSCOPY N/A 12/18/2019   Procedure: VIDEO BRONCHOSCOPY WITHOUT FLUORO;  Surgeon: Shelah Lamar RAMAN, MD;  Location: [facility] ENDOSCOPY;  Service: Cardiopulmonary;  Laterality: N/A;   VIDEO BRONCHOSCOPY WITH ENDOBRONCHIAL ULTRASOUND N/A 08/20/2019   Procedure: VIDEO BRONCHOSCOPY WITH ENDOBRONCHIAL ULTRASOUND;  Surgeon: Shelah Lamar RAMAN, MD;  Location: [facility] ENDOSCOPY;  Service: Pulmonary;  Laterality: N/A;  REVIEW OF SYSTEMS:  Constitutional: positive for fatigue Eyes: negative Ears, nose, mouth, throat, and face: positive for hoarseness Respiratory: negative Cardiovascular: negative Gastrointestinal: negative Genitourinary:negative Integument/breast: negative Hematologic/lymphatic: negative Musculoskeletal:negative Neurological: negative Behavioral/Psych: negative Endocrine: negative Allergic/Immunologic: negative   PHYSICAL EXAMINATION: General appearance: alert, cooperative, and no distress Head: Normocephalic, without obvious abnormality, atraumatic Neck: no adenopathy, no JVD, supple, symmetrical, trachea midline, and thyroid  not enlarged, symmetric, no tenderness/mass/nodules Lymph nodes: Cervical, supraclavicular, and axillary nodes normal. Resp: clear to auscultation bilaterally Back: symmetric, no curvature. ROM normal. No CVA tenderness. Cardio: regular rate and rhythm, S1, S2 normal, no murmur, click, rub or gallop GI: soft, non-tender; bowel sounds normal; no masses,  no organomegaly Extremities: extremities normal, atraumatic, no cyanosis or edema Neurologic: Alert and oriented X 3, normal  strength and tone. Normal symmetric reflexes. Normal coordination and gait  ECOG PERFORMANCE STATUS: 1 - Symptomatic but completely ambulatory  Blood pressure (!) 145/68, pulse (!) 58, temperature 98.1 F (36.7 C), temperature source Temporal, resp. rate 17, height 5' 7 (1.702 m), weight 189 lb 3.2 oz (85.8 kg), SpO2 99%.  LABORATORY DATA: Lab Results  Component Value Date   WBC 6.0 02/09/2023   HGB 10.5 (L) 02/09/2023   HCT 29.9 (L) 02/09/2023   MCV 92.9 02/09/2023   PLT 269 02/09/2023      Chemistry      Component Value Date/Time   NA 140 02/09/2023 1445   NA 146 (H) 10/04/2021 1616   K 4.1 02/09/2023 1445   CL 107 02/09/2023 1445   CO2 28 02/09/2023 1445   BUN 25 (H) 02/09/2023 1445   BUN 12 10/04/2021 1616   CREATININE 1.40 (H) 02/09/2023 1445   CREATININE 1.25 (H) 08/08/2016 0912      Component Value Date/Time   CALCIUM 9.3 02/09/2023 1445   ALKPHOS 50 02/09/2023 1445   AST 20 02/09/2023 1445   ALT 13 02/09/2023 1445   BILITOT 0.5 02/09/2023 1445       RADIOGRAPHIC STUDIES: No results found.  ASSESSMENT AND PLAN: This is a very pleasant 83 years old [name] with stage IV non-small cell lung cancer, squamous cell carcinoma with negative PD-L1 expression. The patient underwent systemic chemotherapy with carboplatin  and paclitaxel  for 2 cycles in addition to immunotherapy with ipilimumab  1 mg/KG every 6 weeks and nivolumab  360 mg IV every 3 weeks status post 22  cycles. The patient is currently on observation and he is feeling fine with no concerning complaints. The patient had repeat CT scan of the chest, abdomen and pelvis performed few weeks ago.  I personally and independently reviewed the scan and discussed the result with the patient today. His scan showed no concerning findings for disease progression.    Post-Treatment Surveillance for Cancer Under post-treatment surveillance following completion of immune therapy nearly a year ago. Recent  imaging of the chest, abdomen, and pelvis shows no evidence of disease recurrence or metastasis. Reports no new symptoms such as weight loss, night sweats, nausea, vomiting, diarrhea, or chest pain. Active, playing golf, and has no significant complaints. Decision to continue observation based on stable imaging and absence of symptoms. - Continue observation - Schedule follow-up appointment in four months - Advise to call if any issues arise  Tracheostomy Management Experiences occasional dyspnea attributed to tracheostomy tube occlusion. Plans to see Dr. Carlie for evaluation and management of the tracheostomy tube. - Refer to Dr. Carlie for tracheostomy tube evaluation and management  General Health Maintenance Maintaining an active lifestyle with no  new health complaints. Encouraged to continue current activities and to seek medical attention if any new symptoms develop. - Encourage continued physical activity - Provide anticipatory guidance to seek medical attention if new symptoms develop.   The patient was advised to call immediately if he has any concerning symptoms in the interval. The patient voices understanding of current disease status and treatment options and is in agreement with the current care plan.  All questions were answered. The patient knows to call the clinic with any problems, questions or concerns. We can certainly see the patient much sooner if necessary. The total time spent in the appointment was 30 minutes.  Disclaimer: This note was dictated with voice recognition software. Similar sounding words can inadvertently be transcribed and may not be corrected upon review.

## 2023-02-15 ENCOUNTER — Telehealth: Payer: Self-pay | Admitting: Family Medicine

## 2023-02-15 NOTE — Telephone Encounter (Signed)
 [facility] called they have not been able to reach patient, confirmed all numbers are correct. They were supposed to see patient today but since they have not been able to reach, start of care will be delayed until at least Monday

## 2023-02-16 ENCOUNTER — Encounter: Payer: Medicare HMO | Admitting: Family Medicine

## 2023-02-22 ENCOUNTER — Ambulatory Visit: Payer: Self-pay

## 2023-02-22 ENCOUNTER — Telehealth: Payer: Self-pay | Admitting: Family Medicine

## 2023-02-22 DIAGNOSIS — Z430 Encounter for attention to tracheostomy: Secondary | ICD-10-CM | POA: Diagnosis not present

## 2023-02-22 DIAGNOSIS — Z7984 Long term (current) use of oral hypoglycemic drugs: Secondary | ICD-10-CM | POA: Diagnosis not present

## 2023-02-22 DIAGNOSIS — Z556 Problems related to health literacy: Secondary | ICD-10-CM | POA: Diagnosis not present

## 2023-02-22 DIAGNOSIS — Z7982 Long term (current) use of aspirin: Secondary | ICD-10-CM | POA: Diagnosis not present

## 2023-02-22 DIAGNOSIS — C3492 Malignant neoplasm of unspecified part of left bronchus or lung: Secondary | ICD-10-CM | POA: Diagnosis not present

## 2023-02-22 DIAGNOSIS — E119 Type 2 diabetes mellitus without complications: Secondary | ICD-10-CM | POA: Diagnosis not present

## 2023-02-22 DIAGNOSIS — G3184 Mild cognitive impairment, so stated: Secondary | ICD-10-CM | POA: Diagnosis not present

## 2023-02-22 NOTE — Patient Outreach (Signed)
  Care Coordination   Follow Up Visit Note   02/22/2023 Name: Shaun Kennedy MRN: [id number] DOB: Jun 05, 1940  Shaun Kennedy is a 83 y.o. year old male who sees Shaun Hacking, MD for primary care. I spoke with  Shaun Kennedy and wife Shaun Kennedy by phone today.  What matters to the patients health and wellness today?  Patient would like to continue to manage his chronic health conditions without complications.     Goals Addressed             This Visit's Progress    COMPLETED: To maintain current cancer status       Care Coordination Interventions: Assessed patient understanding of cancer diagnosis and recommended treatment plan Determined patient continues to be under observation for his small cell lung carcinoma Discussed with patient's wife, she continues to drive patient to his medical appointments Completed SDOH screening, no barriers identified at this time Instructed wife/patient to notify his doctor of any new symptoms or concers Discussed plans with patient for ongoing care coordination follow up and provided patient with direct contact information for nurse care coordinator     Interventions Today    Flowsheet Row Most Recent Value  Chronic Disease   Chronic disease during today's visit Other  [lung CA,  tracheostomy]  General Interventions   General Interventions Discussed/Reviewed General Interventions Discussed, General Interventions Reviewed, Doctor Visits, Labs  Doctor Visits Discussed/Reviewed PCP, Doctor Visits Reviewed, Doctor Visits Discussed, Specialist, Annual Wellness Visits  Education Interventions   Education Provided Provided Education  Provided Verbal Education On When to see the doctor  Pharmacy Interventions   Pharmacy Dicussed/Reviewed Pharmacy Topics Reviewed          SDOH assessments and interventions completed:  Yes  SDOH Interventions Today    Flowsheet Row Most Recent Value  SDOH Interventions   Food Insecurity Interventions  Intervention Not Indicated  Housing Interventions Intervention Not Indicated  Transportation Interventions Intervention Not Indicated  Utilities Interventions Intervention Not Indicated        Care Coordination Interventions:  Yes, provided   Follow up plan: Follow up call scheduled for 06/16/23 @12 :00 PM    Encounter Outcome:  Patient Visit Completed

## 2023-02-22 NOTE — Patient Instructions (Signed)
 Visit Information  Thank you for taking time to visit with me today. Please don't hesitate to contact me if I can be of assistance to you.   Following are the goals we discussed today:   Goals Addressed             This Visit's Progress    COMPLETED: To maintain current cancer status       Care Coordination Interventions: Assessed patient understanding of cancer diagnosis and recommended treatment plan Determined patient continues to be under observation for his small cell lung carcinoma Discussed with patient's wife, she continues to drive patient to his medical appointments Completed SDOH screening, no barriers identified at this time Instructed wife/patient to notify his doctor of any new symptoms or concers Discussed plans with patient for ongoing care coordination follow up and provided patient with direct contact information for nurse care coordinator         Our next appointment is by telephone on 06/19/23 at 12:00 PM  Please call the care guide team at [phone] if you need to cancel or reschedule your appointment.   If you are experiencing a Mental Health or Behavioral Health Crisis or need someone to talk to, please call 1-800-273-TALK (toll free, 24 hour hotline)  Patient verbalizes understanding of instructions and care plan provided today and agrees to view in MyChart. Active MyChart status and patient understanding of how to access instructions and care plan via MyChart confirmed with patient.     Louanne Roussel RN BSN CCM [place]  [facility], [facility] Nurse Care Coordinator  Direct Dial: [phone] Website: [email or web address]

## 2023-02-22 NOTE — Telephone Encounter (Signed)
 Tenechia with [facility] called t# [phone] states when she input pt's medications she got a level 1 drug interaction alert for his amlodipine  & Simvastatin  & she is required to let the doctor know.

## 2023-02-23 DIAGNOSIS — J386 Stenosis of larynx: Secondary | ICD-10-CM | POA: Diagnosis not present

## 2023-02-23 DIAGNOSIS — Z930 Tracheostomy status: Secondary | ICD-10-CM | POA: Diagnosis not present

## 2023-02-23 NOTE — Telephone Encounter (Signed)
Tenechia called back to follow up on drug interaction & also request Nursing order for once a week for 6 weeks

## 2023-02-24 NOTE — Telephone Encounter (Signed)
Called gave verbal order.  

## 2023-03-03 DIAGNOSIS — G3184 Mild cognitive impairment, so stated: Secondary | ICD-10-CM | POA: Diagnosis not present

## 2023-03-03 DIAGNOSIS — C3492 Malignant neoplasm of unspecified part of left bronchus or lung: Secondary | ICD-10-CM | POA: Diagnosis not present

## 2023-03-03 DIAGNOSIS — Z7984 Long term (current) use of oral hypoglycemic drugs: Secondary | ICD-10-CM | POA: Diagnosis not present

## 2023-03-03 DIAGNOSIS — E119 Type 2 diabetes mellitus without complications: Secondary | ICD-10-CM | POA: Diagnosis not present

## 2023-03-03 DIAGNOSIS — Z7982 Long term (current) use of aspirin: Secondary | ICD-10-CM | POA: Diagnosis not present

## 2023-03-03 DIAGNOSIS — Z556 Problems related to health literacy: Secondary | ICD-10-CM | POA: Diagnosis not present

## 2023-03-03 DIAGNOSIS — Z430 Encounter for attention to tracheostomy: Secondary | ICD-10-CM | POA: Diagnosis not present

## 2023-03-07 DIAGNOSIS — Z556 Problems related to health literacy: Secondary | ICD-10-CM

## 2023-03-07 DIAGNOSIS — C3492 Malignant neoplasm of unspecified part of left bronchus or lung: Secondary | ICD-10-CM

## 2023-03-07 DIAGNOSIS — G3184 Mild cognitive impairment, so stated: Secondary | ICD-10-CM

## 2023-03-07 DIAGNOSIS — Z7984 Long term (current) use of oral hypoglycemic drugs: Secondary | ICD-10-CM

## 2023-03-07 DIAGNOSIS — Z7982 Long term (current) use of aspirin: Secondary | ICD-10-CM

## 2023-03-07 DIAGNOSIS — Z430 Encounter for attention to tracheostomy: Secondary | ICD-10-CM

## 2023-03-07 DIAGNOSIS — E119 Type 2 diabetes mellitus without complications: Secondary | ICD-10-CM

## 2023-03-09 DIAGNOSIS — E119 Type 2 diabetes mellitus without complications: Secondary | ICD-10-CM | POA: Diagnosis not present

## 2023-03-09 DIAGNOSIS — Z7984 Long term (current) use of oral hypoglycemic drugs: Secondary | ICD-10-CM | POA: Diagnosis not present

## 2023-03-09 DIAGNOSIS — Z7982 Long term (current) use of aspirin: Secondary | ICD-10-CM | POA: Diagnosis not present

## 2023-03-09 DIAGNOSIS — G3184 Mild cognitive impairment, so stated: Secondary | ICD-10-CM | POA: Diagnosis not present

## 2023-03-09 DIAGNOSIS — C3492 Malignant neoplasm of unspecified part of left bronchus or lung: Secondary | ICD-10-CM | POA: Diagnosis not present

## 2023-03-09 DIAGNOSIS — Z430 Encounter for attention to tracheostomy: Secondary | ICD-10-CM | POA: Diagnosis not present

## 2023-03-09 DIAGNOSIS — Z556 Problems related to health literacy: Secondary | ICD-10-CM | POA: Diagnosis not present

## 2023-03-16 DIAGNOSIS — Z556 Problems related to health literacy: Secondary | ICD-10-CM | POA: Diagnosis not present

## 2023-03-16 DIAGNOSIS — E119 Type 2 diabetes mellitus without complications: Secondary | ICD-10-CM | POA: Diagnosis not present

## 2023-03-16 DIAGNOSIS — G3184 Mild cognitive impairment, so stated: Secondary | ICD-10-CM | POA: Diagnosis not present

## 2023-03-16 DIAGNOSIS — C3492 Malignant neoplasm of unspecified part of left bronchus or lung: Secondary | ICD-10-CM | POA: Diagnosis not present

## 2023-03-16 DIAGNOSIS — Z7982 Long term (current) use of aspirin: Secondary | ICD-10-CM | POA: Diagnosis not present

## 2023-03-16 DIAGNOSIS — Z430 Encounter for attention to tracheostomy: Secondary | ICD-10-CM | POA: Diagnosis not present

## 2023-03-16 DIAGNOSIS — Z7984 Long term (current) use of oral hypoglycemic drugs: Secondary | ICD-10-CM | POA: Diagnosis not present

## 2023-03-23 DIAGNOSIS — Z556 Problems related to health literacy: Secondary | ICD-10-CM | POA: Diagnosis not present

## 2023-03-23 DIAGNOSIS — Z7982 Long term (current) use of aspirin: Secondary | ICD-10-CM | POA: Diagnosis not present

## 2023-03-23 DIAGNOSIS — Z430 Encounter for attention to tracheostomy: Secondary | ICD-10-CM | POA: Diagnosis not present

## 2023-03-23 DIAGNOSIS — Z7984 Long term (current) use of oral hypoglycemic drugs: Secondary | ICD-10-CM | POA: Diagnosis not present

## 2023-03-23 DIAGNOSIS — G3184 Mild cognitive impairment, so stated: Secondary | ICD-10-CM | POA: Diagnosis not present

## 2023-03-23 DIAGNOSIS — E119 Type 2 diabetes mellitus without complications: Secondary | ICD-10-CM | POA: Diagnosis not present

## 2023-03-23 DIAGNOSIS — C3492 Malignant neoplasm of unspecified part of left bronchus or lung: Secondary | ICD-10-CM | POA: Diagnosis not present

## 2023-03-24 DIAGNOSIS — Z556 Problems related to health literacy: Secondary | ICD-10-CM | POA: Diagnosis not present

## 2023-03-24 DIAGNOSIS — G3184 Mild cognitive impairment, so stated: Secondary | ICD-10-CM | POA: Diagnosis not present

## 2023-03-24 DIAGNOSIS — Z7984 Long term (current) use of oral hypoglycemic drugs: Secondary | ICD-10-CM | POA: Diagnosis not present

## 2023-03-24 DIAGNOSIS — C3492 Malignant neoplasm of unspecified part of left bronchus or lung: Secondary | ICD-10-CM | POA: Diagnosis not present

## 2023-03-24 DIAGNOSIS — Z430 Encounter for attention to tracheostomy: Secondary | ICD-10-CM | POA: Diagnosis not present

## 2023-03-24 DIAGNOSIS — E119 Type 2 diabetes mellitus without complications: Secondary | ICD-10-CM | POA: Diagnosis not present

## 2023-03-24 DIAGNOSIS — Z7982 Long term (current) use of aspirin: Secondary | ICD-10-CM | POA: Diagnosis not present

## 2023-03-27 ENCOUNTER — Ambulatory Visit (HOSPITAL_COMMUNITY)
Admission: RE | Admit: 2023-03-27 | Discharge: 2023-03-27 | Disposition: A | Discharge status: Home / Self Care | Payer: Medicare HMO | Source: Ambulatory Visit | Attending: Acute Care | Admitting: Acute Care

## 2023-03-27 DIAGNOSIS — J386 Stenosis of larynx: Secondary | ICD-10-CM | POA: Diagnosis not present

## 2023-03-27 DIAGNOSIS — Z930 Tracheostomy status: Secondary | ICD-10-CM | POA: Diagnosis not present

## 2023-03-27 DIAGNOSIS — J383 Other diseases of vocal cords: Secondary | ICD-10-CM | POA: Diagnosis present

## 2023-03-27 NOTE — Progress Notes (Signed)
Respiratory care note- routine trach change performed. Pt continuing with a #[id number] trach. Pt tolerated change well with good color change with end-tidal and site clean and dry. Trach secured with velcro ties and new PMV given to patient. VSS stable through procedure. No issues noted at time of change. Will continue to follow patient progress with pt to be scheduled for return visit in approx 12 weeks.

## 2023-03-27 NOTE — Progress Notes (Signed)
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  I saw him last 9/19. He continues to follow w/ Onc. He has completed palliative XRT 2021, Last CT imaging raised concern for possible mets to bowel he completed  22 cycles of chemo in Jan 2024. Now on observation and thriving. Last CT imaging did NOT suggest further disease progression.  Presents today for planned trach change back in July. Comes in for planned trach change   Review of Systems  Constitutional: Negative.   HENT: Negative.    Skin: Negative.    Exam  General 83 year old male. Ambulatory. No distress. Walked into clinic HENT 6 cuffless trach. Phonation quality at baseline Pulm clear Card rrr Abd soft  Procedure  The # 6 trach was removed. Site inspected. Janina Mayo had as expected biofilm. New trach site placed over obturator. Site verified via ETCo2. No secretions, min dry secretions around stoma. Sig biofilm on trach  Impression/plan  Active Problems:   Tracheostomy status (HCC)   Glottic stenosis   Vocal cord dysfunction   Tracheostomy status (HCC) Overview:  Trach Brand: Shiley Size:  6.0 [id number] Style: Uncuffed Last change before today 12/22 in ER. Prior was in Nov here in clinic  Discussion Stable trach, not a candidate for trach decannulation d/t subglottic stenosis  Plan Cont routine trach care ROV 12 weeks          My time 15 min  Simonne Martinet ACNP-BC [facility] Pulmonary/Critical Care Pager # [phone] OR # [phone] if no answer

## 2023-03-29 ENCOUNTER — Other Ambulatory Visit: Payer: Self-pay | Admitting: Family Medicine

## 2023-03-29 DIAGNOSIS — C3492 Malignant neoplasm of unspecified part of left bronchus or lung: Secondary | ICD-10-CM | POA: Diagnosis not present

## 2023-03-29 DIAGNOSIS — Z7984 Long term (current) use of oral hypoglycemic drugs: Secondary | ICD-10-CM | POA: Diagnosis not present

## 2023-03-29 DIAGNOSIS — Z556 Problems related to health literacy: Secondary | ICD-10-CM | POA: Diagnosis not present

## 2023-03-29 DIAGNOSIS — Z430 Encounter for attention to tracheostomy: Secondary | ICD-10-CM | POA: Diagnosis not present

## 2023-03-29 DIAGNOSIS — Z7982 Long term (current) use of aspirin: Secondary | ICD-10-CM | POA: Diagnosis not present

## 2023-03-29 DIAGNOSIS — G3184 Mild cognitive impairment, so stated: Secondary | ICD-10-CM | POA: Diagnosis not present

## 2023-03-29 DIAGNOSIS — E119 Type 2 diabetes mellitus without complications: Secondary | ICD-10-CM

## 2023-03-29 NOTE — Telephone Encounter (Signed)
Last OV: 10/05/22 Refills left: 0 Last refill 02/09/23 Patient is out of supply.

## 2023-03-29 NOTE — Telephone Encounter (Signed)
Copied from CRM [id number]. Topic: Clinical - Medication Refill >> Mar 29, 2023  3:44 PM Carlatta H wrote: Most Recent Primary Care Visit:  Provider: Ronnald Nian  Department: [facility]  Visit Type: MED MGMT 30  Date: 02/09/2023  Medication: Blood Glucose Monitoring Suppl DEVI [[id number]]  Has the patient contacted their pharmacy? No (Agent: If no, request that the patient contact the pharmacy for the refill. If patient does not wish to contact the pharmacy document the reason why and proceed with request.) (Agent: If yes, when and what did the pharmacy advise?)  Is this the correct pharmacy for this prescription? Yes If no, delete pharmacy and type the correct one.  This is the patient's preferred pharmacy:    [facility] [address] [place], [place] - [address] [place] [address] Phone: [phone] Fax: [phone]   Has the prescription been filled recently? No  Is the patient out of the medication? Yes  Has the patient been seen for an appointment in the last year OR does the patient have an upcoming appointment? Yes  Can we respond through MyChart? No  Agent: Please be advised that Rx refills may take up to 3 business days. We ask that you follow-up with your pharmacy.

## 2023-03-30 ENCOUNTER — Other Ambulatory Visit: Payer: Self-pay | Admitting: Family Medicine

## 2023-03-30 DIAGNOSIS — R531 Weakness: Secondary | ICD-10-CM

## 2023-03-30 MED ORDER — BLOOD GLUCOSE MONITORING SUPPL DEVI: 1.0000 | Freq: Three times a day (TID) | 0 refills | Status: DC

## 2023-04-04 ENCOUNTER — Encounter: Payer: Self-pay | Admitting: Internal Medicine

## 2023-04-12 ENCOUNTER — Other Ambulatory Visit: Payer: Self-pay | Admitting: Medical Oncology

## 2023-04-12 ENCOUNTER — Inpatient Hospital Stay: Payer: Medicare HMO | Attending: Internal Medicine

## 2023-04-12 VITALS — BP 115/55 | HR 54 | Temp 99.0°F | Resp 18

## 2023-04-12 DIAGNOSIS — Z85118 Personal history of other malignant neoplasm of bronchus and lung: Secondary | ICD-10-CM | POA: Diagnosis not present

## 2023-04-12 DIAGNOSIS — Z95828 Presence of other vascular implants and grafts: Secondary | ICD-10-CM

## 2023-04-12 DIAGNOSIS — Z452 Encounter for adjustment and management of vascular access device: Secondary | ICD-10-CM | POA: Insufficient documentation

## 2023-04-12 MED ORDER — HEPARIN SOD (PORK) LOCK FLUSH 100 UNIT/ML IV SOLN
500.0000 [IU] | Freq: Once | INTRAVENOUS | Status: AC
Start: 2023-04-12 — End: 2023-04-12
  Administered 2023-04-12: 500 [IU]

## 2023-04-12 MED ORDER — LIDOCAINE-PRILOCAINE 2.5-2.5 % EX CREA: 1.0000 | TOPICAL_CREAM | CUTANEOUS | 0 refills | Status: DC | PRN

## 2023-04-12 MED ORDER — SODIUM CHLORIDE 0.9% FLUSH
10.0000 mL | Freq: Once | INTRAVENOUS | Status: AC
  Administered 2023-04-12: 10 mL

## 2023-04-18 ENCOUNTER — Telehealth: Payer: Self-pay | Admitting: Family Medicine

## 2023-04-18 NOTE — Telephone Encounter (Signed)
 Copied from CRM [id number]. Topic: Referral - Question >> Apr 18, 2023  9:23 AM Ivette P wrote: Reason for CRM: daughter Shon Baton called in to ask a questions about referring her dad to a neurologist. Daughter states medication for the memory is not working, it is off and on. Would ike a call back to discuss further. [phone]

## 2023-04-18 NOTE — Telephone Encounter (Signed)
 Called [name], reached vm, lmtrc needs appt per jcl.

## 2023-04-19 DIAGNOSIS — Z556 Problems related to health literacy: Secondary | ICD-10-CM | POA: Diagnosis not present

## 2023-04-19 DIAGNOSIS — Z7982 Long term (current) use of aspirin: Secondary | ICD-10-CM | POA: Diagnosis not present

## 2023-04-19 DIAGNOSIS — Z430 Encounter for attention to tracheostomy: Secondary | ICD-10-CM | POA: Diagnosis not present

## 2023-04-19 DIAGNOSIS — C3492 Malignant neoplasm of unspecified part of left bronchus or lung: Secondary | ICD-10-CM | POA: Diagnosis not present

## 2023-04-19 DIAGNOSIS — Z7984 Long term (current) use of oral hypoglycemic drugs: Secondary | ICD-10-CM | POA: Diagnosis not present

## 2023-04-19 DIAGNOSIS — G3184 Mild cognitive impairment, so stated: Secondary | ICD-10-CM | POA: Diagnosis not present

## 2023-04-19 DIAGNOSIS — E119 Type 2 diabetes mellitus without complications: Secondary | ICD-10-CM | POA: Diagnosis not present

## 2023-04-25 ENCOUNTER — Ambulatory Visit: Payer: Medicare HMO

## 2023-04-25 VITALS — BP 138/70 | HR 66 | Temp 98.0°F | Ht 68.0 in | Wt 195.0 lb

## 2023-04-25 DIAGNOSIS — Z0000 Encounter for general adult medical examination without abnormal findings: Secondary | ICD-10-CM | POA: Diagnosis not present

## 2023-04-25 NOTE — Progress Notes (Signed)
 Subjective:   Shaun Kennedy is a 83 y.o. who presents for a Medicare Wellness preventive visit.  Visit Complete: In person    Persons Participating in Visit: n/a  AWV Questionnaire: No: Patient Medicare AWV questionnaire was not completed prior to this visit.  Cardiac Risk Factors include: advanced age (>60men, >43 women);diabetes mellitus;dyslipidemia;hypertension;male gender     Objective:    Today's Vitals   04/25/23 1330  BP: 138/70  Pulse: 66  Temp: 98 F (36.7 C)  TempSrc: Oral  SpO2: 98%  Weight: 195 lb (88.5 kg)  Height: 5\' 8"  (1.727 m)   Body mass index is 29.65 kg/m.     04/25/2023    1:35 PM 01/29/2023   12:27 AM 09/17/2022    8:46 PM 04/19/2022    9:23 AM 03/30/2022   10:32 AM 03/09/2022   10:43 AM 10/13/2021   11:18 AM  Advanced Directives  Does Patient Have a Medical Advance Directive? Yes No No Yes Yes Yes Yes  Type of Advance Directive Living will   Healthcare Power of [place];Living will   Living will  Does patient want to make changes to medical advance directive?      No - Patient declined   Copy of Healthcare Power of Attorney in Chart?    No - copy requested   No - copy requested  Would patient like information on creating a medical advance directive?   No - Patient declined        Current Medications (verified) Outpatient Encounter Medications as of 04/25/2023  Medication Sig   Alcohol Swabs (DROPSAFE ALCOHOL PREP) 70 % PADS Apply topically.   amLODipine (NORVASC) 5 MG tablet Take 1 tablet (5 mg total) by mouth daily.   aspirin EC 81 MG tablet Take 81 mg by mouth daily after breakfast.    bisacodyl (DULCOLAX) 5 MG EC tablet Take 10 mg by mouth daily as needed for moderate constipation.   Blood Glucose Monitoring Suppl DEVI 1 each by Does not apply route in the morning, at noon, and at bedtime. May substitute to any manufacturer covered by patient's insurance.   cholecalciferol (VITAMIN D) 25 MCG (1000 UNIT) tablet Take 1,000 Units by mouth  daily.   donepezil (ARICEPT) 10 MG tablet Take 1 tablet (10 mg total) by mouth at bedtime.   ferrous sulfate 325 (65 FE) MG EC tablet TAKE 1 TABLET EVERY DAY WITH BREAKFAST   gabapentin (NEURONTIN) 100 MG capsule Take by mouth.   hydrOXYzine (ATARAX) 10 MG tablet Take 1 tablet (10 mg total) by mouth 3 (three) times daily as needed.   lidocaine-prilocaine (EMLA) cream Apply 1 Application topically as needed.   lisinopril-hydrochlorothiazide (ZESTORETIC) 20-12.5 MG tablet Take 1 tablet by mouth daily.   memantine (NAMENDA) 10 MG tablet Take 1 tablet (10 mg total) by mouth 2 (two) times daily.   metFORMIN (GLUCOPHAGE-XR) 500 MG 24 hr tablet TAKE 1 TABLET (500 MG TOTAL) BY MOUTH DAILY.   Multiple Vitamin (MULTIVITAMIN WITH MINERALS) TABS tablet Take 1 tablet by mouth daily after breakfast.    simvastatin (ZOCOR) 40 MG tablet TAKE 1 TABLET EVERY DAY   solifenacin (VESICARE) 5 MG tablet TAKE 1 TABLET EVERY DAY   No facility-administered encounter medications on file as of 04/25/2023.    Allergies (verified) Patient has no known allergies.   History: Past Medical History:  Diagnosis Date   Allergic rhinitis    Asthma    Carotid stenosis    Colon cancer (HCC) 2003   Diabetes  mellitus (HCC)    Diverticulosis    Dyslipidemia    ED (erectile dysfunction)    GERD (gastroesophageal reflux disease)    H/O degenerative disc disease    Hemorrhoids    HTN (hypertension)    as a child   Hyperlipidemia    Lung cancer (HCC)    LVH (left ventricular hypertrophy)    on EKG   Mass of lower lobe of left lung    Mediastinal adenopathy    Smoker    former   Wears dentures    Wears dentures    Wears glasses    Wears glasses    Past Surgical History:  Procedure Laterality Date   BRONCHIAL BIOPSY  08/20/2019   Procedure: BRONCHIAL BIOPSIES;  Surgeon: Leslye Peer, MD;  Location: [facility] ENDOSCOPY;  Service: Pulmonary;;   BRONCHIAL BRUSHINGS  08/20/2019   Procedure: BRONCHIAL BRUSHINGS;  Surgeon:  Leslye Peer, MD;  Location: [facility] ENDOSCOPY;  Service: Pulmonary;;   BRONCHIAL NEEDLE ASPIRATION BIOPSY  08/20/2019   Procedure: BRONCHIAL NEEDLE ASPIRATION BIOPSIES;  Surgeon: Leslye Peer, MD;  Location: [facility] ENDOSCOPY;  Service: Pulmonary;;   CARPAL TUNNEL RELEASE Right 01/09/2019   Procedure: CARPAL TUNNEL RELEASE;  Surgeon: Tarry Kos, MD;  Location: [facility];  Service: Orthopedics;  Laterality: Right;   CARPAL TUNNEL WITH CUBITAL TUNNEL Right 11/18/2020   Procedure: CARPAL TUNNEL WITH CUBITAL TUNNEL;  Surgeon: Dairl Ponder, MD;  Location: [facility] OR;  Service: Orthopedics;  Laterality: Right;   CATARACT EXTRACTION Right 2018   COLONOSCOPY  2007   Dr. Elnoria Roy   DIRECT LARYNGOSCOPY N/A 04/10/2020   Procedure: DIRECT LARYNGOSCOPY;  Surgeon: Christia Reading, MD;  Location: [facility] OR;  Service: ENT;  Laterality: N/A;   I & D EXTREMITY Right 04/02/2016   Procedure: IRRIGATION AND DEBRIDEMENT GREAT TOE;  Surgeon: Tarry Kos, MD;  Location: [facility] OR;  Service: Orthopedics;  Laterality: Right;   IR IMAGING GUIDED PORT INSERTION  08/30/2019   MULTIPLE TOOTH EXTRACTIONS     RADIOACTIVE SEED IMPLANT  2003   TRACHEOSTOMY TUBE PLACEMENT N/A 04/10/2020   Procedure: TRACHEOSTOMY;  Surgeon: Christia Reading, MD;  Location: [facility] OR;  Service: ENT;  Laterality: N/A;   ULNAR TUNNEL RELEASE Right 01/09/2019   Procedure: RIGHT CUBITAL TUNNEL RELEASE AND CARPAL TUNNEL RELEASE;  Surgeon: Tarry Kos, MD;  Location: [facility];  Service: Orthopedics;  Laterality: Right;   UPPER GASTROINTESTINAL ENDOSCOPY     VIDEO BRONCHOSCOPY N/A 12/18/2019   Procedure: VIDEO BRONCHOSCOPY WITHOUT FLUORO;  Surgeon: Leslye Peer, MD;  Location: [facility] ENDOSCOPY;  Service: Cardiopulmonary;  Laterality: N/A;   VIDEO BRONCHOSCOPY WITH ENDOBRONCHIAL ULTRASOUND N/A 08/20/2019   Procedure: VIDEO BRONCHOSCOPY WITH ENDOBRONCHIAL ULTRASOUND;  Surgeon: Leslye Peer, MD;  Location: [facility] ENDOSCOPY;  Service: Pulmonary;   Laterality: N/A;   Family History  Problem Relation Age of Onset   Heart failure Mother    Diabetes Mother    Stroke Father    Diabetes Father    Colon cancer Sister    Diabetes Sister    Diabetes Sister    Lung cancer Sister 30   Esophageal cancer Neg Hx    Rectal cancer Neg Hx    Colon polyps Neg Hx    Stomach cancer Neg Hx    Social History   Socioeconomic History   Marital status: Married    Spouse name: Not on file   Number of children: 3   Years of education: Not on  file   Highest education level: Not on file  Occupational History   Occupation: retired  Tobacco Use   Smoking status: Former    Current packs/day: 0.00    Average packs/day: 1 pack/day for 32.0 years (32.0 ttl pk-yrs)    Types: Cigarettes    Start date: 02/08/1955    Quit date: 10/08/1985    Years since quitting: 37.5   Smokeless tobacco: Never  Vaping Use   Vaping status: Never Used  Substance and Sexual Activity   Alcohol use: No   Drug use: No   Sexual activity: Yes  Other Topics Concern   Not on file  Social History Narrative   Married, no pets   Social Drivers of Health   Financial Resource Strain: Low Risk  (04/25/2023)   Overall Financial Resource Strain (CARDIA)    Difficulty of Paying Living Expenses: Not hard at all  Food Insecurity: No Food Insecurity (04/25/2023)   Hunger Vital Sign    Worried About Running Out of Food in the Last Year: Never true    Ran Out of Food in the Last Year: Never true  Transportation Needs: No Transportation Needs (04/25/2023)   PRAPARE - Administrator, Civil Service (Medical): No    Lack of Transportation (Non-Medical): No  Physical Activity: Sufficiently Active (04/25/2023)   Exercise Vital Sign    Days of Exercise per Week: 3 days    Minutes of Exercise per Session: 90 min  Stress: No Stress Concern Present (04/25/2023)   [employer] of Occupational Health - Occupational Stress Questionnaire    Feeling of Stress : Not at all   Social Connections: Moderately Integrated (04/25/2023)   Social Connection and Isolation Panel [NHANES]    Frequency of Communication with Friends and Family: More than three times a week    Frequency of Social Gatherings with Friends and Family: More than three times a week    Attends Religious Services: More than 4 times per year    Active Member of [employer] or Organizations: No    Attends Engineer, structural: Never    Marital Status: Married    Tobacco Counseling Counseling given: Not Answered    Clinical Intake:  Pre-visit preparation completed: Yes  Pain : No/denies pain     Nutritional Risks: None Diabetes: Yes CBG done?: No Did pt. bring in CBG monitor from home?: No  How often do you need to have someone help you when you read instructions, pamphlets, or other written materials from your doctor or pharmacy?: 1 - Never  Interpreter Needed?: No  Information entered by :: NAllen LPN   Activities of Daily Living     04/25/2023    1:31 PM  In your present state of health, do you have any difficulty performing the following activities:  Hearing? 0  Vision? 0  Difficulty concentrating or making decisions? 1  Walking or climbing stairs? 0  Dressing or bathing? 0  Doing errands, shopping? 0  Preparing Food and eating ? N  Using the Toilet? N  In the past six months, have you accidently leaked urine? N  Do you have problems with loss of bowel control? N  Managing your Medications? N  Managing your Finances? N  Housekeeping or managing your Housekeeping? N    Patient Care Team: Ronnald Nian, MD as PCP - General (Family Medicine) Si Gaul, MD as Consulting Physician (Oncology) Artis Delay, MD as Consulting Physician (Hematology and Oncology) Christia Reading, MD as Consulting Physician (  Otolaryngology) Little, Karma Lew, RN as VBCI Care Management  Indicate any recent Medical Services you may have received from other than [facility] providers in the  past year (date may be approximate).     Assessment:   This is a routine wellness examination for [facility].  Hearing/Vision screen Hearing Screening - Comments:: Denies hearing issues Vision Screening - Comments:: Regular eye exams, [facility]   Goals Addressed             This Visit's Progress    Patient Stated       04/25/2023, denies goals       Depression Screen     04/25/2023    1:36 PM 04/19/2022    9:23 AM 03/31/2021    2:18 PM 02/17/2021    9:39 AM 04/27/2020    1:02 PM 04/27/2020   12:56 PM 10/28/2019   10:31 AM  PHQ 2/9 Scores  PHQ - 2 Score 0 0 0 0 0  0  PHQ- 9 Score 0        Exception Documentation    Other- indicate reason in comment box Other- indicate reason in comment box Other- indicate reason in comment box   Not completed    call completed with spouse  call completed with spouse     Fall Risk     04/25/2023    1:35 PM 04/19/2022    9:23 AM 06/17/2021   10:24 AM 04/13/2021    9:28 AM 03/31/2021    2:18 PM  Fall Risk   Falls in the past year? 0 0 0 0 0  Number falls in past yr: 0 0 0 0 0  Injury with Fall? 0 0 0 0 0  Risk for fall due to : Medication side effect Medication side effect Medication side effect Medication side effect No Fall Risks  Follow up Falls prevention discussed;Falls evaluation completed Falls prevention discussed;Education provided;Falls evaluation completed Falls evaluation completed Falls evaluation completed Falls evaluation completed    MEDICARE RISK AT HOME:  Medicare Risk at Home Any stairs in or around the home?: Yes If so, are there any without handrails?: No Home free of loose throw rugs in walkways, pet beds, electrical cords, etc?: Yes Adequate lighting in your home to reduce risk of falls?: Yes Life alert?: No Use of a cane, walker or w/c?: No Grab bars in the bathroom?: No Shower chair or bench in shower?: Yes Elevated toilet seat or a handicapped toilet?: Yes  TIMED UP AND GO:  Was the test performed?  Yes   Length of time to ambulate 10 feet: 5 sec Gait steady and fast without use of assistive device  Cognitive Function: 6CIT completed        04/25/2023    1:37 PM 04/19/2022    9:25 AM  6CIT Screen  What Year? 0 points 0 points  What month? 0 points 0 points  What time? 3 points 0 points  Count back from 20 0 points 0 points  Months in reverse 0 points 2 points  Repeat phrase 10 points 10 points  Total Score 13 points 12 points    Immunizations Immunization History  Administered Date(s) Administered   Fluad Quad(high Dose 65+) 10/17/2018, 10/28/2019, 11/24/2020   Fluad Trivalent(High Dose 65+) 10/05/2022   Influenza, High Dose Seasonal PF 12/02/2015, 12/12/2016, 11/02/2017, 11/09/2022   Influenza,inj,Quad PF,6+ Mos 01/18/2022   PFIZER(Purple Top)SARS-COV-2 Vaccination 04/05/2019, 05/01/2019, 10/28/2019   PNEUMOCOCCAL CONJUGATE-20 11/09/2022   Pfizer Covid-19 Vaccine Bivalent Booster 69yrs & up  11/24/2020   Pfizer(Comirnaty)Fall Seasonal Vaccine 12 years and older 11/22/2021   Pneumococcal Conjugate-13 06/25/2014   Pneumococcal Polysaccharide-23 06/03/2010   Respiratory Syncytial Virus Vaccine,Recomb Aduvanted(Arexvy) 11/09/2022   Tdap 12/06/2007, 10/31/2019   Unspecified SARS-COV-2 Vaccination 11/09/2022   Zoster Recombinant(Shingrix) 02/25/2017, 04/26/2017   Zoster, Live 02/08/2008    Screening Tests Health Maintenance  Topic Date Due   Diabetic kidney evaluation - Urine ACR  10/17/2019   FOOT EXAM  07/30/2021   COVID-19 Vaccine (7 - Pfizer risk 2024-25 season) 05/10/2023   OPHTHALMOLOGY EXAM  05/23/2023   HEMOGLOBIN A1C  08/09/2023   Diabetic kidney evaluation - eGFR measurement  02/09/2024   Medicare Annual Wellness (AWV)  04/24/2024   DTaP/Tdap/Td (3 - Td or Tdap) 10/30/2029   Pneumonia Vaccine 53+ Years old  Completed   INFLUENZA VACCINE  Completed   Zoster Vaccines- Shingrix  Completed   HPV VACCINES  Aged Out   Colonoscopy  Discontinued    Health  Maintenance  Health Maintenance Due  Topic Date Due   Diabetic kidney evaluation - Urine ACR  10/17/2019   FOOT EXAM  07/30/2021   Health Maintenance Items Addressed: Has appointment with Dr. Susann Givens in May for foot exam and urine  Additional Screening:  Vision Screening: Recommended annual ophthalmology exams for early detection of glaucoma and other disorders of the eye.  Dental Screening: Recommended annual dental exams for proper oral hygiene  Community Resource Referral / Chronic Care Management: CRR required this visit?  No   CCM required this visit?  No     Plan:     I have personally reviewed and noted the following in the patient's chart:   Medical and social history Use of alcohol, tobacco or illicit drugs  Current medications and supplements including opioid prescriptions. Patient is not currently taking opioid prescriptions. Functional ability and status Nutritional status Physical activity Advanced directives List of other physicians Hospitalizations, surgeries, and ER visits in previous 12 months Vitals Screenings to include cognitive, depression, and falls Referrals and appointments  In addition, I have reviewed and discussed with patient certain preventive protocols, quality metrics, and best practice recommendations. A written personalized care plan for preventive services as well as general preventive health recommendations were provided to patient.     Barb Merino, LPN   [id number]   After Visit Summary: (MyChart) Due to this being a telephonic visit, the after visit summary with patients personalized plan was offered to patient via MyChart   Notes: Nothing significant to report at this time.

## 2023-04-25 NOTE — Patient Instructions (Signed)
 Shaun Kennedy , Thank you for taking time to come for your Medicare Wellness Visit. I appreciate your ongoing commitment to your health goals. Please review the following plan we discussed and let me know if I can assist you in the future.   Referrals/Orders/Follow-Ups/Clinician Recommendations: none  This is a list of the screening recommended for you and due dates:  Health Maintenance  Topic Date Due   Yearly kidney health urinalysis for diabetes  10/17/2019   Complete foot exam   07/30/2021   COVID-19 Vaccine (7 - Pfizer risk 2024-25 season) 05/10/2023   Eye exam for diabetics  05/23/2023   Hemoglobin A1C  08/09/2023   Yearly kidney function blood test for diabetes  02/09/2024   Medicare Annual Wellness Visit  04/24/2024   DTaP/Tdap/Td vaccine (3 - Td or Tdap) 10/30/2029   Pneumonia Vaccine  Completed   Flu Shot  Completed   Zoster (Shingles) Vaccine  Completed   HPV Vaccine  Aged Out   Colon Cancer Screening  Discontinued    Advanced directives: (Copy Requested) Please bring a copy of your health care power of attorney and living will to the office to be added to your chart at your convenience. You can mail to [facility] [address] [place], [place] [address] or email to [email or web address]  Next Medicare Annual Wellness Visit scheduled for next year: Yes  insert Preventive Care attachment Insert FALL PREVENTION attachment if needed

## 2023-05-01 ENCOUNTER — Other Ambulatory Visit: Payer: Self-pay | Admitting: Family Medicine

## 2023-05-01 DIAGNOSIS — G3184 Mild cognitive impairment, so stated: Secondary | ICD-10-CM

## 2023-05-02 DIAGNOSIS — H04123 Dry eye syndrome of bilateral lacrimal glands: Secondary | ICD-10-CM | POA: Diagnosis not present

## 2023-05-02 DIAGNOSIS — H01001 Unspecified blepharitis right upper eyelid: Secondary | ICD-10-CM | POA: Diagnosis not present

## 2023-05-02 DIAGNOSIS — B880 Other acariasis: Secondary | ICD-10-CM | POA: Diagnosis not present

## 2023-05-02 DIAGNOSIS — H01004 Unspecified blepharitis left upper eyelid: Secondary | ICD-10-CM | POA: Diagnosis not present

## 2023-05-02 LAB — HM DIABETES EYE EXAM

## 2023-05-03 ENCOUNTER — Encounter: Payer: Self-pay | Admitting: Family Medicine

## 2023-05-15 ENCOUNTER — Ambulatory Visit (HOSPITAL_COMMUNITY)
Admission: RE | Admit: 2023-05-15 | Discharge: 2023-05-15 | Disposition: A | Discharge status: Home / Self Care | Source: Ambulatory Visit | Attending: Acute Care | Admitting: Acute Care

## 2023-05-15 DIAGNOSIS — Z430 Encounter for attention to tracheostomy: Secondary | ICD-10-CM | POA: Insufficient documentation

## 2023-05-15 DIAGNOSIS — J955 Postprocedural subglottic stenosis: Secondary | ICD-10-CM | POA: Insufficient documentation

## 2023-05-15 DIAGNOSIS — C3490 Malignant neoplasm of unspecified part of unspecified bronchus or lung: Secondary | ICD-10-CM | POA: Insufficient documentation

## 2023-05-15 DIAGNOSIS — Z930 Tracheostomy status: Secondary | ICD-10-CM

## 2023-05-15 DIAGNOSIS — Z923 Personal history of irradiation: Secondary | ICD-10-CM | POA: Insufficient documentation

## 2023-05-15 NOTE — Progress Notes (Signed)
 Tracheostomy Procedure Note  Shaun Kennedy [id number] 08/22/1940  Pre Procedure Tracheostomy Information  Trach Brand: Shiley Size:  6.0 [id number] Style: Uncuffed Secured by: Velcro   Procedure: Trach Cleaning and Trach Change    Post Procedure Tracheostomy Information  Trach Brand: Shiley Size:  6.0 [id number]  Style: Uncuffed Secured by: Velcro   Post Procedure Evaluation:  ETCO2 positive color change from yellow to purple : Yes.   Vital signs:VSS Patients current condition: stable Complications: No apparent complications Trach site exam: clean, dry Wound care done: 4 x 4 gauze drain Patient did tolerate procedure well.   Education: none  Prescription needs: none    Additional needs: No PMV given at this visit

## 2023-05-16 NOTE — Progress Notes (Signed)
 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  I saw him last 9/19. He continues to follow w/ Onc. He has completed palliative XRT 2021, Last CT imaging raised concern for possible mets to bowel he completed  22 cycles of chemo in Jan 2024. Now on observation and thriving. Last CT imaging did NOT suggest further disease progression.  Presents today for planned trach change   Review of Systems  Constitutional: Negative.   HENT:  Positive for congestion.        Reports recent seasonal allergies otherwise no complaints  Skin: Negative.    Exam  General 83 year old male patient ambulatory walking to tracheostomy clinic for planned trach change.  HEENT normocephalic atraumatic phonation quality at baseline with PMV Pulmonary clear to auscultation Cardiac: Regular rate and rhythm Abdomen Soft nontender no organomegaly Extremities: Warm dry Neuro at baseline and intact  Procedure  The # 6 trach was removed. Site inspected. Janina Mayo had as expected biofilm. New trach site placed over obturator. Site verified via ETCo2. No secretions, continues to have fairly significant biofilm on tracheostomy and her cannula when removed  Impression/plan  Active Problems:   Tracheostomy status (HCC)   Tracheostomy status (HCC) Overview:  Trach Brand: Shiley Size:  6.0 [id number] Style: Uncuffed Last change 4/8, today Discussion Stable trach, not a candidate for trach decannulation d/t subglottic stenosis  Plan Cont routine trach care ROV 8 weeks, with increased pollen and concerned about biofilm if his tracheostomy begins to bother him or cause discomfort he knows he can call to get earlier appointment          My time 14 min  Simonne Martinet ACNP-BC [facility] Pulmonary/Critical Care Pager # [phone] OR # [phone] if no answer

## 2023-06-05 ENCOUNTER — Inpatient Hospital Stay: Payer: Medicare HMO | Attending: Internal Medicine

## 2023-06-05 ENCOUNTER — Ambulatory Visit (HOSPITAL_COMMUNITY): Admission: RE | Admit: 2023-06-05 | Source: Ambulatory Visit

## 2023-06-05 DIAGNOSIS — Z452 Encounter for adjustment and management of vascular access device: Secondary | ICD-10-CM | POA: Diagnosis not present

## 2023-06-05 DIAGNOSIS — Z85118 Personal history of other malignant neoplasm of bronchus and lung: Secondary | ICD-10-CM | POA: Diagnosis not present

## 2023-06-05 DIAGNOSIS — Z95828 Presence of other vascular implants and grafts: Secondary | ICD-10-CM

## 2023-06-05 DIAGNOSIS — C3490 Malignant neoplasm of unspecified part of unspecified bronchus or lung: Secondary | ICD-10-CM

## 2023-06-05 LAB — CBC WITH DIFFERENTIAL (CANCER CENTER ONLY)
Abs Immature Granulocytes: 0.01 10*3/uL (ref 0.00–0.07)
Basophils Absolute: 0 10*3/uL (ref 0.0–0.1)
Basophils Relative: 0 %
Eosinophils Absolute: 0.7 10*3/uL — ABNORMAL HIGH (ref 0.0–0.5)
Eosinophils Relative: 15 %
HCT: 31.2 % — ABNORMAL LOW (ref 39.0–52.0)
Hemoglobin: 10.5 g/dL — ABNORMAL LOW (ref 13.0–17.0)
Immature Granulocytes: 0 %
Lymphocytes Relative: 25 %
Lymphs Abs: 1.2 10*3/uL (ref 0.7–4.0)
MCH: 31.3 pg (ref 26.0–34.0)
MCHC: 33.7 g/dL (ref 30.0–36.0)
MCV: 92.9 fL (ref 80.0–100.0)
Monocytes Absolute: 0.6 10*3/uL (ref 0.1–1.0)
Monocytes Relative: 12 %
Neutro Abs: 2.3 10*3/uL (ref 1.7–7.7)
Neutrophils Relative %: 48 %
Platelet Count: 268 10*3/uL (ref 150–400)
RBC: 3.36 MIL/uL — ABNORMAL LOW (ref 4.22–5.81)
RDW: 12.8 % (ref 11.5–15.5)
WBC Count: 4.8 10*3/uL (ref 4.0–10.5)
nRBC: 0 % (ref 0.0–0.2)

## 2023-06-05 MED ORDER — HEPARIN SOD (PORK) LOCK FLUSH 100 UNIT/ML IV SOLN
500.0000 [IU] | Freq: Once | INTRAVENOUS | Status: AC
  Administered 2023-06-05: 500 [IU]

## 2023-06-05 MED ORDER — SODIUM CHLORIDE 0.9% FLUSH
10.0000 mL | Freq: Once | INTRAVENOUS | Status: AC
  Administered 2023-06-05: 10 mL

## 2023-06-12 ENCOUNTER — Telehealth: Payer: Self-pay | Admitting: Medical Oncology

## 2023-06-12 NOTE — Telephone Encounter (Signed)
 I told wife that I cancelled appt tomorrow with Dr Marguerita Shih because Gwen Lek did not get a CT scan. I gave her the number to schedule the CT scan and sent schedule message to schedule a f/u with [facility].

## 2023-06-13 ENCOUNTER — Inpatient Hospital Stay: Payer: Medicare HMO | Admitting: Internal Medicine

## 2023-06-14 ENCOUNTER — Ambulatory Visit: Payer: Medicare HMO | Admitting: Family Medicine

## 2023-06-14 ENCOUNTER — Telehealth: Payer: Self-pay | Admitting: Internal Medicine

## 2023-06-14 ENCOUNTER — Encounter: Payer: Self-pay | Admitting: Family Medicine

## 2023-06-14 ENCOUNTER — Other Ambulatory Visit: Payer: Self-pay | Admitting: Family Medicine

## 2023-06-14 ENCOUNTER — Telehealth: Payer: Self-pay

## 2023-06-14 ENCOUNTER — Telehealth: Payer: Self-pay | Admitting: *Deleted

## 2023-06-14 VITALS — BP 120/68 | HR 58 | Resp 95 | Ht 68.0 in | Wt 193.0 lb

## 2023-06-14 DIAGNOSIS — E785 Hyperlipidemia, unspecified: Secondary | ICD-10-CM | POA: Diagnosis not present

## 2023-06-14 DIAGNOSIS — G3184 Mild cognitive impairment, so stated: Secondary | ICD-10-CM | POA: Diagnosis not present

## 2023-06-14 DIAGNOSIS — I152 Hypertension secondary to endocrine disorders: Secondary | ICD-10-CM | POA: Diagnosis not present

## 2023-06-14 DIAGNOSIS — R531 Weakness: Secondary | ICD-10-CM

## 2023-06-14 DIAGNOSIS — R061 Stridor: Secondary | ICD-10-CM | POA: Diagnosis not present

## 2023-06-14 DIAGNOSIS — E1169 Type 2 diabetes mellitus with other specified complication: Secondary | ICD-10-CM | POA: Diagnosis not present

## 2023-06-14 DIAGNOSIS — Z930 Tracheostomy status: Secondary | ICD-10-CM

## 2023-06-14 DIAGNOSIS — C3432 Malignant neoplasm of lower lobe, left bronchus or lung: Secondary | ICD-10-CM | POA: Diagnosis not present

## 2023-06-14 DIAGNOSIS — E119 Type 2 diabetes mellitus without complications: Secondary | ICD-10-CM

## 2023-06-14 DIAGNOSIS — E1159 Type 2 diabetes mellitus with other circulatory complications: Secondary | ICD-10-CM

## 2023-06-14 DIAGNOSIS — N3281 Overactive bladder: Secondary | ICD-10-CM | POA: Diagnosis not present

## 2023-06-14 LAB — POCT GLYCOSYLATED HEMOGLOBIN (HGB A1C): Hemoglobin A1C: 6.3 % — AB (ref 4.0–5.6)

## 2023-06-14 MED ORDER — DONEPEZIL HCL 10 MG PO TABS
10.0000 mg | ORAL_TABLET | Freq: Every day | ORAL | 1 refills | Status: DC
Start: 2023-06-14 — End: 2023-12-05

## 2023-06-14 MED ORDER — SOLIFENACIN SUCCINATE 5 MG PO TABS: 5.0000 mg | ORAL_TABLET | Freq: Every day | ORAL | 1 refills | Status: DC

## 2023-06-14 MED ORDER — SIMVASTATIN 40 MG PO TABS: 40.0000 mg | ORAL_TABLET | Freq: Every day | ORAL | 1 refills | Status: DC

## 2023-06-14 MED ORDER — LISINOPRIL-HYDROCHLOROTHIAZIDE 20-12.5 MG PO TABS
1.0000 | ORAL_TABLET | Freq: Every day | ORAL | 3 refills | Status: DC
Start: 2023-06-14 — End: 2023-12-28

## 2023-06-14 MED ORDER — METFORMIN HCL ER 500 MG PO TB24: 500.0000 mg | ORAL_TABLET | Freq: Every day | ORAL | 0 refills | Status: DC

## 2023-06-14 NOTE — Progress Notes (Signed)
 Complete physical exam  Patient: Shaun Kennedy   DOB: November 20, 1940   83 y.o. Male  MRN: [id number]  Subjective:    Chief Complaint  Patient presents with   Follow-up    Med mgmt.     Shaun Kennedy is a 83 y.o. male who presents today for a diabetes follow-up He reports consuming a general diet.  Plays golf and walks.  He generally feels well. He reports sleeping well.  He continues on Namenda  and Aricept  and they seem to be doing well.  He continues on metformin  and is having no difficulty with that.  Also on amlodipine  and lisinopril  simvastatin  is causing no difficulty.  He continues to be followed by Dr. Marguerita Shih  Most recent fall risk assessment:    06/14/2023   10:43 AM  Fall Risk   Falls in the past year? 0  Number falls in past yr: 0  Injury with Fall? 0  Risk for fall due to : No Fall Risks  Follow up Falls evaluation completed     Most recent depression screenings:    06/14/2023   10:44 AM 04/25/2023    1:36 PM  PHQ 2/9 Scores  PHQ - 2 Score 0 0  PHQ- 9 Score  0    Vision:Within last year and Dental: No current dental problems and Last dental visit: within the last year    Immunization History  Administered Date(s) Administered   Fluad Quad(high Dose 65+) 10/17/2018, 10/28/2019, 11/24/2020   Fluad Trivalent(High Dose 65+) 10/05/2022   Influenza, High Dose Seasonal PF 12/02/2015, 12/12/2016, 11/02/2017, 11/09/2022   Influenza,inj,Quad PF,6+ Mos 01/18/2022   PFIZER(Purple Top)SARS-COV-2 Vaccination 04/05/2019, 05/01/2019, 10/28/2019   PNEUMOCOCCAL CONJUGATE-20 11/09/2022   Pfizer Covid-19 Vaccine Bivalent Booster 81yrs & up 11/24/2020   Pfizer(Comirnaty)Fall Seasonal Vaccine 12 years and older 11/22/2021   Pneumococcal Conjugate-13 06/25/2014   Pneumococcal Polysaccharide-23 06/03/2010   Respiratory Syncytial Virus Vaccine,Recomb Aduvanted(Arexvy) 11/09/2022   Tdap 12/06/2007, 10/31/2019   Unspecified SARS-COV-2 Vaccination 11/09/2022   Zoster  Recombinant(Shingrix) 02/25/2017, 04/26/2017   Zoster, Live 02/08/2008    Health Maintenance  Topic Date Due   Diabetic kidney evaluation - Urine ACR  10/17/2019   FOOT EXAM  07/30/2021   COVID-19 Vaccine (7 - Pfizer risk 2024-25 season) 05/10/2023   HEMOGLOBIN A1C  08/09/2023   INFLUENZA VACCINE  09/08/2023   Diabetic kidney evaluation - eGFR measurement  02/09/2024   Medicare Annual Wellness (AWV)  04/24/2024   OPHTHALMOLOGY EXAM  05/01/2024   DTaP/Tdap/Td (3 - Td or Tdap) 10/30/2029   Pneumonia Vaccine 57+ Years old  Completed   Zoster Vaccines- Shingrix  Completed   HPV VACCINES  Aged Out   Meningococcal B Vaccine  Aged Out   Colonoscopy  Discontinued    Patient Care Team: Watson Hacking, MD as PCP - General (Family Medicine) Marlene Simas, MD as Consulting Physician (Oncology) Almeda Jacobs, MD as Consulting Physician (Hematology and Oncology) Virgina Grills, MD as Consulting Physician (Otolaryngology) Nathen Balder Skeeter Dukes, RN as VBCI Care Management Little, Skeeter Dukes, RN   Outpatient Medications Prior to Visit  Medication Sig Note   Alcohol Swabs (DROPSAFE ALCOHOL PREP) 70 % PADS Apply topically.    aspirin  EC 81 MG tablet Take 81 mg by mouth daily after breakfast.     bisacodyl (DULCOLAX) 5 MG EC tablet Take 10 mg by mouth daily as needed for moderate constipation.    cholecalciferol  (VITAMIN D ) 25 MCG (1000 UNIT) tablet Take 1,000 Units by mouth daily.  hydrOXYzine  (ATARAX ) 10 MG tablet Take 1 tablet (10 mg total) by mouth 3 (three) times daily as needed.    lidocaine -prilocaine  (EMLA ) cream Apply 1 Application topically as needed. 06/14/2023: Needs refill   memantine  (NAMENDA ) 10 MG tablet TAKE 1 TABLET TWICE DAILY    Multiple Vitamin (MULTIVITAMIN WITH MINERALS) TABS tablet Take 1 tablet by mouth daily after breakfast.     [DISCONTINUED] amLODipine  (NORVASC ) 5 MG tablet Take 1 tablet (5 mg total) by mouth daily. 06/14/2023: Needs refill   [DISCONTINUED] donepezil  (ARICEPT )  10 MG tablet Take 1 tablet (10 mg total) by mouth at bedtime.    [DISCONTINUED] ferrous sulfate  325 (65 FE) MG EC tablet TAKE 1 TABLET EVERY DAY WITH BREAKFAST    [DISCONTINUED] gabapentin  (NEURONTIN ) 100 MG capsule Take by mouth.    [DISCONTINUED] lisinopril -hydrochlorothiazide  (ZESTORETIC ) 20-12.5 MG tablet Take 1 tablet by mouth daily. 06/14/2023: Needs refill   [DISCONTINUED] metFORMIN  (GLUCOPHAGE -XR) 500 MG 24 hr tablet TAKE 1 TABLET (500 MG TOTAL) BY MOUTH DAILY. 06/14/2023: Needs refill    [DISCONTINUED] simvastatin  (ZOCOR ) 40 MG tablet TAKE 1 TABLET EVERY DAY    [DISCONTINUED] solifenacin  (VESICARE ) 5 MG tablet TAKE 1 TABLET EVERY DAY    Blood Glucose Monitoring Suppl DEVI 1 each by Does not apply route in the morning, at noon, and at bedtime. May substitute to any manufacturer covered by patient's insurance. 06/14/2023: Needs refill   No facility-administered medications prior to visit.    ROS     Objective:      Physical Exam   Alert and in no distress.  Hemoglobin A1c is 6.3     Assessment & Plan:    Discussed health benefits of physical activity, and encouraged him to engage in regular exercise appropriate for his age and condition.  Diabetes mellitus type II, non insulin  dependent (HCC) - Plan: POCT glycosylated hemoglobin (Hb A1C)  Tracheostomy status (HCC)  Stridor  Malignant neoplasm of lower lobe of left lung (HCC)  Mild cognitive impairment - Plan: donepezil  (ARICEPT ) 10 MG tablet  OAB (overactive bladder) - Plan: solifenacin  (VESICARE ) 5 MG tablet  Hypertension associated with diabetes (HCC) - Plan: lisinopril -hydrochlorothiazide  (ZESTORETIC ) 20-12.5 MG tablet  Hyperlipidemia associated with type 2 diabetes mellitus (HCC) - Plan: Lipid panel, simvastatin  (ZOCOR ) 40 MG tablet  Type 2 diabetes mellitus without complication, without long-term current use of insulin  (HCC) - Plan: metFORMIN  (GLUCOPHAGE -XR) 500 MG 24 hr tablet   Return in about 6 months (around  12/15/2023).      Ron Cobbs, MD

## 2023-06-14 NOTE — Telephone Encounter (Signed)
 Copied from CRM [id number]. Topic: Clinical - Order For Equipment >> Jun 14, 2023 11:23 AM Rosaria Common wrote: Reason for CRM: Pt's wife calling because pt is low on Trach Brand: Shiley Size:  6.0 [id number]  Style: Uncuffed Secured by: Velcro  And needs more while at appt today. Submitted request about 2 weeks ago and haven't heard back since.   Callback number is [phone] Lucie Ruts.  Looks like Dr. Tellis Feathers office (ENT) orders these. Phone call was sent to them on 05/18/23 and they said they called her and placed order. I gave her their phone number [phone] to call them and follow up with them.

## 2023-06-14 NOTE — Telephone Encounter (Signed)
 Scheduled appointment with the patient and his wife. The patient will be mailed an appointment reminder.

## 2023-06-14 NOTE — Telephone Encounter (Signed)
 Wife LVM stating someone called but didn't leave a VM.  Spoke with wife and confirmed up coming appts for patient.  She voiced understanding.

## 2023-06-15 ENCOUNTER — Encounter: Payer: Self-pay | Admitting: Family Medicine

## 2023-06-15 LAB — LIPID PANEL
Chol/HDL Ratio: 2.1 ratio (ref 0.0–5.0)
Cholesterol, Total: 142 mg/dL (ref 100–199)
HDL: 67 mg/dL (ref >39)
LDL Chol Calc (NIH): 62 mg/dL (ref 0–99)
Triglycerides: 65 mg/dL (ref 0–149)
VLDL Cholesterol Cal: 13 mg/dL (ref 5–40)

## 2023-06-22 ENCOUNTER — Other Ambulatory Visit: Payer: Self-pay | Admitting: Internal Medicine

## 2023-06-22 ENCOUNTER — Ambulatory Visit (HOSPITAL_COMMUNITY)
Admission: RE | Admit: 2023-06-22 | Discharge: 2023-06-22 | Disposition: A | Discharge status: Home / Self Care | Source: Ambulatory Visit | Attending: Internal Medicine | Admitting: Internal Medicine

## 2023-06-22 ENCOUNTER — Encounter (HOSPITAL_COMMUNITY): Payer: Self-pay

## 2023-06-22 DIAGNOSIS — C3490 Malignant neoplasm of unspecified part of unspecified bronchus or lung: Secondary | ICD-10-CM

## 2023-06-22 DIAGNOSIS — N281 Cyst of kidney, acquired: Secondary | ICD-10-CM | POA: Diagnosis not present

## 2023-06-22 DIAGNOSIS — K4090 Unilateral inguinal hernia, without obstruction or gangrene, not specified as recurrent: Secondary | ICD-10-CM | POA: Diagnosis not present

## 2023-06-22 MED ORDER — IOHEXOL 9 MG/ML PO SOLN
1000.0000 mL | ORAL | Status: AC
  Administered 2023-06-22: 1000 mL via ORAL

## 2023-06-22 MED ORDER — IOHEXOL 9 MG/ML PO SOLN
ORAL | Status: AC
  Filled 2023-06-22: qty 1000

## 2023-06-27 ENCOUNTER — Inpatient Hospital Stay: Attending: Internal Medicine | Admitting: Internal Medicine

## 2023-06-27 VITALS — BP 156/59 | HR 66 | Temp 98.1°F | Resp 17 | Ht 68.0 in | Wt 188.7 lb

## 2023-06-27 DIAGNOSIS — Z85118 Personal history of other malignant neoplasm of bronchus and lung: Secondary | ICD-10-CM | POA: Diagnosis not present

## 2023-06-27 DIAGNOSIS — C3490 Malignant neoplasm of unspecified part of unspecified bronchus or lung: Secondary | ICD-10-CM | POA: Diagnosis not present

## 2023-06-27 NOTE — Progress Notes (Signed)
 [facility] [phone]   [phone]  OFFICE PROGRESS NOTE  Watson Hacking, MD [address] [place] [address]  DIAGNOSIS: stage IV (T3, N2, M1 a) non-small cell lung cancer, squamous cell carcinoma presented with obstructive left lower lobe lung mass in addition to mediastinal lymphadenopathy as well as bilateral pulmonary nodules diagnosed in July 2021.   PDL1: 0%   PRIOR THERAPY:  1) Palliative radiotherapy to the obstructive left lower lobe lung mass under the care of Dr. Jeryl Moris.  2)  systemic chemotherapy 2 cycles of chemotherapy with carboplatin  for an AUC of 5 and paclitaxel  175 mg/m2 in addition to immunotherapy with nivolumab  360 mg every 3 weeks and ipilimumab  1 mg/kg IV every 6 weeks followed by maintenance nivolumab  and ipilimumab .  He started the first treatment on 09/04/2019.  He is status post 22 cycles.  Last was giving on February 16, 2022.  This treatment was discontinued after the patient completed more than 2 years on the immunotherapy.   CURRENT THERAPY: Observation  INTERVAL HISTORY: Shaun Kennedy 83 y.o. male returns to the clinic today for follow-up visit.  Discussed the use of AI scribe software for clinical note transcription with the patient, who gave verbal consent to proceed.  History of Present Illness   Shaun Kennedy is an 83 year old male with stage four non-small cell lung cancer who presents for evaluation and repeat CT scan for restaging of his disease.  He was diagnosed with stage four non-small cell lung cancer, squamous cell carcinoma, in July 2020. Initial treatment included two cycles of systemic chemotherapy with carboplatin  and paclitaxel , along with nivolumab  and ipilimumab . This was followed by maintenance therapy with nivolumab  and ipilimumab  for two years. Since January 2024, he has been under observation.  He underwent a CT scan of the chest, abdomen, and pelvis last week for restaging. No  new symptoms in the last six months, including no chest pain, breathing issues, nausea, vomiting, diarrhea, or headaches.  He has a tracheostomy and mentioned needing more supplies, as he is on his last one. He also has a portocath, which requires flushing every two months.       MEDICAL HISTORY: Past Medical History:  Diagnosis Date   Allergic rhinitis    Asthma    Carotid stenosis    Colon cancer (HCC) 2003   Diabetes mellitus (HCC)    Diverticulosis    Dyslipidemia    ED (erectile dysfunction)    GERD (gastroesophageal reflux disease)    H/O degenerative disc disease    Hemorrhoids    HTN (hypertension)    as a child   Hyperlipidemia    Lung cancer (HCC)    LVH (left ventricular hypertrophy)    on EKG   Mass of lower lobe of left lung    Mediastinal adenopathy    Smoker    former   Wears dentures    Wears dentures    Wears glasses    Wears glasses     ALLERGIES:  has no known allergies.  MEDICATIONS:  Current Outpatient Medications  Medication Sig Dispense Refill   Alcohol Swabs (DROPSAFE ALCOHOL PREP) 70 % PADS Apply topically.     amLODipine  (NORVASC ) 5 MG tablet TAKE 1 TABLET (5 MG TOTAL) BY MOUTH DAILY. 90 tablet 3   aspirin  EC 81 MG tablet Take 81 mg by mouth daily after breakfast.      bisacodyl (DULCOLAX) 5 MG EC tablet Take  10 mg by mouth daily as needed for moderate constipation.     Blood Glucose Monitoring Suppl DEVI 1 each by Does not apply route in the morning, at noon, and at bedtime. May substitute to any manufacturer covered by patient's insurance. 1 each 0   cholecalciferol  (VITAMIN D ) 25 MCG (1000 UNIT) tablet Take 1,000 Units by mouth daily.     donepezil  (ARICEPT ) 10 MG tablet Take 1 tablet (10 mg total) by mouth at bedtime. 90 tablet 1   ferrous sulfate  325 (65 FE) MG EC tablet TAKE 1 TABLET EVERY DAY WITH BREAKFAST 90 tablet 3   hydrOXYzine  (ATARAX ) 10 MG tablet Take 1 tablet (10 mg total) by mouth 3 (three) times daily as needed. 90 tablet 1    lidocaine -prilocaine  (EMLA ) cream Apply 1 Application topically as needed. 30 g 0   lisinopril -hydrochlorothiazide  (ZESTORETIC ) 20-12.5 MG tablet Take 1 tablet by mouth daily. 90 tablet 3   memantine  (NAMENDA ) 10 MG tablet TAKE 1 TABLET TWICE DAILY 180 tablet 1   metFORMIN  (GLUCOPHAGE -XR) 500 MG 24 hr tablet Take 1 tablet (500 mg total) by mouth daily. 90 tablet 0   Multiple Vitamin (MULTIVITAMIN WITH MINERALS) TABS tablet Take 1 tablet by mouth daily after breakfast.      simvastatin  (ZOCOR ) 40 MG tablet Take 1 tablet (40 mg total) by mouth daily. 90 tablet 1   solifenacin  (VESICARE ) 5 MG tablet Take 1 tablet (5 mg total) by mouth daily. 90 tablet 1   No current facility-administered medications for this visit.    SURGICAL HISTORY:  Past Surgical History:  Procedure Laterality Date   BRONCHIAL BIOPSY  08/20/2019   Procedure: BRONCHIAL BIOPSIES;  Surgeon: Denson Flake, MD;  Location[facility] ENDOSCOPY;  Service: Pulmonary;;   BRONCHIAL BRUSHINGS  08/20/2019   Procedure: BRONCHIAL BRUSHINGS;  Surgeon: Denson Flake, MD;  Location: [facility] ENDOSCOPY;  Service: Pulmonary;;   BRONCHIAL NEEDLE ASPIRATION BIOPSY  08/20/2019   Procedure: BRONCHIAL NEEDLE ASPIRATION BIOPSIES;  Surgeon: Denson Flake, MD;  Location: [facility] ENDOSCOPY;  Service: Pulmonary;;   CARPAL TUNNEL RELEASE Right 01/09/2019   Procedure: CARPAL TUNNEL RELEASE;  Surgeon: Wes Hamman, MD;  Location: [facility];  Service: Orthopedics;  Laterality: Right;   CARPAL TUNNEL WITH CUBITAL TUNNEL Right 11/18/2020   Procedure: CARPAL TUNNEL WITH CUBITAL TUNNEL;  Surgeon: Florida Hurter, MD;  Location: [facility] OR;  Service: Orthopedics;  Laterality: Right;   CATARACT EXTRACTION Right 2018   COLONOSCOPY  2007   Dr. Nickey Barn   DIRECT LARYNGOSCOPY N/A 04/10/2020   Procedure: DIRECT LARYNGOSCOPY;  Surgeon: Virgina Grills, MD;  Location: [facility] OR;  Service: ENT;  Laterality: N/A;   I & D EXTREMITY Right 04/02/2016   Procedure: IRRIGATION AND  DEBRIDEMENT GREAT TOE;  Surgeon: Wes Hamman, MD;  Location: [facility] OR;  Service: Orthopedics;  Laterality: Right;   IR IMAGING GUIDED PORT INSERTION  08/30/2019   MULTIPLE TOOTH EXTRACTIONS     RADIOACTIVE SEED IMPLANT  2003   TRACHEOSTOMY TUBE PLACEMENT N/A 04/10/2020   Procedure: TRACHEOSTOMY;  Surgeon: Virgina Grills, MD;  Location: [facility] OR;  Service: ENT;  Laterality: N/A;   ULNAR TUNNEL RELEASE Right 01/09/2019   Procedure: RIGHT CUBITAL TUNNEL RELEASE AND CARPAL TUNNEL RELEASE;  Surgeon: Wes Hamman, MD;  Location: [facility];  Service: Orthopedics;  Laterality: Right;   UPPER GASTROINTESTINAL ENDOSCOPY     VIDEO BRONCHOSCOPY N/A 12/18/2019   Procedure: VIDEO BRONCHOSCOPY WITHOUT FLUORO;  Surgeon: Denson Flake, MD;  Location: [facility] ENDOSCOPY;  Service: Cardiopulmonary;  Laterality: N/A;   VIDEO BRONCHOSCOPY WITH ENDOBRONCHIAL ULTRASOUND N/A 08/20/2019   Procedure: VIDEO BRONCHOSCOPY WITH ENDOBRONCHIAL ULTRASOUND;  Surgeon: Denson Flake, MD;  Location: [facility] ENDOSCOPY;  Service: Pulmonary;  Laterality: N/A;    REVIEW OF SYSTEMS:  Constitutional: negative Eyes: negative Ears, nose, mouth, throat, and face: positive for hoarseness Respiratory: negative Cardiovascular: negative Gastrointestinal: negative Genitourinary:negative Integument/breast: negative Hematologic/lymphatic: negative Musculoskeletal:negative Neurological: negative Behavioral/Psych: negative Endocrine: negative Allergic/Immunologic: negative   PHYSICAL EXAMINATION: General appearance: alert, cooperative, and no distress Head: Normocephalic, without obvious abnormality, atraumatic Neck: no adenopathy, no JVD, supple, symmetrical, trachea midline, and thyroid  not enlarged, symmetric, no tenderness/mass/nodules Lymph nodes: Cervical, supraclavicular, and axillary nodes normal. Resp: clear to auscultation bilaterally Back: symmetric, no curvature. ROM normal. No CVA tenderness. Cardio: regular rate and  rhythm, S1, S2 normal, no murmur, click, rub or gallop GI: soft, non-tender; bowel sounds normal; no masses,  no organomegaly Extremities: extremities normal, atraumatic, no cyanosis or edema Neurologic: Alert and oriented X 3, normal strength and tone. Normal symmetric reflexes. Normal coordination and gait  ECOG PERFORMANCE STATUS: 1 - Symptomatic but completely ambulatory  Blood pressure (!) 156/59, pulse 66, temperature 98.1 F (36.7 C), temperature source Temporal, resp. rate 17, height 5\' 8"  (1.727 m), weight 188 lb 11.2 oz (85.6 kg), SpO2 99%.  LABORATORY DATA: Lab Results  Component Value Date   WBC 4.8 06/05/2023   HGB 10.5 (L) 06/05/2023   HCT 31.2 (L) 06/05/2023   MCV 92.9 06/05/2023   PLT 268 06/05/2023      Chemistry      Component Value Date/Time   NA 140 02/09/2023 1445   NA 146 (H) 10/04/2021 1616   K 4.1 02/09/2023 1445   CL 107 02/09/2023 1445   CO2 28 02/09/2023 1445   BUN 25 (H) 02/09/2023 1445   BUN 12 10/04/2021 1616   CREATININE 1.40 (H) 02/09/2023 1445   CREATININE 1.25 (H) 08/08/2016 0912      Component Value Date/Time   CALCIUM 9.3 02/09/2023 1445   ALKPHOS 50 02/09/2023 1445   AST 20 02/09/2023 1445   ALT 13 02/09/2023 1445   BILITOT 0.5 02/09/2023 1445       RADIOGRAPHIC STUDIES: CT CHEST ABDOMEN PELVIS WO CONTRAST Result Date: 06/22/2023 EXAMINATION: CT CHEST ABDOMEN PELVIS WO CONTRAST CLINICAL INDICATION: Male, 83 years old. Non-small cell lung cancer (NSCLC), staging TECHNIQUE: Helical CT scan examination of the chest, abdomen, and pelvis is performed from the domes of the diaphragm to the pubic symphysis. Unless otherwise specified, incidental thyroid , adrenal, renal lesions do not require dedicated imaging follow up. Additionally, any mentioned pulmonary nodules do not require dedicated imaging follow-up based on the Fleischner guidelines unless otherwise specified. Coronary calcifications are not identified unless otherwise specified.  COMPARISON: 01/06/2023 FINDINGS: CHEST: Right chest wall Mediport catheter tip terminates in the SVC. Tracheostomy tube in place. The thyroid  is normal. The thoracic aorta is normal in caliber. Scattered calcified atherosclerotic changes are present in the main pulmonary artery is normal in caliber. The location is enlarged. There are coronary calcifications with calcification of the aortic annulus. There is no free fluid or pathologic lymphadenopathy by size criteria. The trachea and mainstem bronchi are patent. There are similar posttreatment changes noted in the left hilar as well as suprahilar and infrahilar regions. Similar 6 mm subpleural left lower lobe pulmonary nodule (series 2 image 38). Other persistent tree-in-bud nodularity noted in the left lower lobe. ABDOMEN/PELVIS: The liver appears normal. The  gallbladder is normal. The spleen is normal. The pancreas is normal. The adrenals are normal. The kidneys are normal other than a right renal cyst. The abdominal aorta is normal in caliber. Scattered calcified atherosclerotic changes are present. Urinary bladder is normal. Right inguinal hernia repair with mesh noted. Small fat-containing left inguinal hernia noted. The urinary bladder is normal. The prostate is enlarged and contains radiation therapy seeds. There is colonic diverticulosis. Large and small bowel loops are otherwise within normal limits. There is no free fluid or pathologic lymphadenopathy by size criteria. BONES: There are degenerative changes of the spine. No suspicious osseous lesions. There are degenerative changes of the bony pelvis. IMPRESSION: Similar posttreatment changes within the left lung with no convincing evidence for active malignancy within the chest, abdomen, or pelvis. Similar centrilobular nodularity in the left lower lobe concerning for a chronic bronchiolitis with stable 6 mm left lower lobe pulmonary nodule. Attention on follow-up. DOSE REDUCTION: All CT scans are  performed using radiation dose reduction techniques, when applicable. Technical factors are evaluated and adjusted to ensure appropriate moderation of exposure. Electronically signed by: [place] Engel MD 06/22/2023 03:27 PM EDT RP Workstation: [id number]    ASSESSMENT AND PLAN: This is a very pleasant 83 years old [name] with stage IV non-small cell lung cancer, squamous cell carcinoma with negative PD-L1 expression. The patient underwent systemic chemotherapy with carboplatin  and paclitaxel  for 2 cycles in addition to immunotherapy with ipilimumab  1 mg/KG every 6 weeks and nivolumab  360 mg IV every 3 weeks status post 22 cycles. The patient is currently on observation and he is feeling fine with no concerning complaints. He had repeat CT scan of the chest, abdomen and pelvis performed recently.  I personally and independently reviewed the scan and discussed the results with the patient today.  His scan showed no concerning findings for disease recurrence or metastasis.     Stage 4 non-small cell lung cancer Stage 4 non-small cell lung cancer, squamous cell carcinoma, diagnosed in July 2020 with negative PD-L1 expression. Initially treated with two cycles of systemic chemotherapy (carboplatin , paclitaxel ) and immunotherapy (nivolumab , ipilimumab ), followed by maintenance nivolumab  and ipilimumab  for two years. Currently on observation since January 2024. Recent CT scan of chest, abdomen, and pelvis shows no disease progression or metastasis. He is asymptomatic with no new symptoms such as chest pain, dyspnea, nausea, vomiting, diarrhea, or headaches. - Schedule follow-up appointment in six months. - Order CT scan of chest, abdomen, and pelvis one week prior to next appointment. - Perform laboratory tests before next appointment. - Flush Port-a-Cath every two months.  Tracheostomy status Tracheostomy in place without issues, but he requires additional supplies. Advised to contact ENT  specialist for assistance with supplies and management. - Advise him to contact ENT specialist for tracheostomy supplies and management.   The patient was advised to call immediately if he has any other concerning symptoms in the interval. The patient voices understanding of current disease status and treatment options and is in agreement with the current care plan.  All questions were answered. The patient knows to call the clinic with any problems, questions or concerns. We can certainly see the patient much sooner if necessary. The total time spent in the appointment was 30 minutes.  Disclaimer: This note was dictated with voice recognition software. Similar sounding words can inadvertently be transcribed and may not be corrected upon review.

## 2023-06-29 NOTE — Progress Notes (Deleted)
0

## 2023-06-30 NOTE — Progress Notes (Deleted)
   Subjective:    Patient ID: Shaun Kennedy, male    DOB: 04/06/1940, 83 y.o.   MRN: [id number]  HPI    Review of Systems     Objective:    Physical Exam        Assessment & Plan:  Diabetes mellitus type II, non insulin  dependent (HCC) - Plan: POCT glycosylated hemoglobin (Hb A1C), DISCONTINUED: Blood Glucose Monitoring Suppl DEVI  Non-small cell cancer of left lung (HCC)  Mild cognitive impairment - Plan: Ambulatory referral to Home Health, DISCONTINUED: donepezil  (ARICEPT ) 10 MG tablet, DISCONTINUED: memantine  (NAMENDA ) 10 MG tablet  Tracheostomy care [facility]) - Plan: Ambulatory referral to ENT, Ambulatory referral to Home Health

## 2023-07-12 ENCOUNTER — Telehealth: Payer: Self-pay

## 2023-07-13 DIAGNOSIS — H0100A Unspecified blepharitis right eye, upper and lower eyelids: Secondary | ICD-10-CM | POA: Diagnosis not present

## 2023-07-13 DIAGNOSIS — H40013 Open angle with borderline findings, low risk, bilateral: Secondary | ICD-10-CM | POA: Diagnosis not present

## 2023-07-13 DIAGNOSIS — E119 Type 2 diabetes mellitus without complications: Secondary | ICD-10-CM | POA: Diagnosis not present

## 2023-07-13 DIAGNOSIS — H04123 Dry eye syndrome of bilateral lacrimal glands: Secondary | ICD-10-CM | POA: Diagnosis not present

## 2023-07-19 ENCOUNTER — Encounter: Payer: Self-pay | Admitting: Internal Medicine

## 2023-07-19 ENCOUNTER — Inpatient Hospital Stay: Attending: Internal Medicine

## 2023-07-19 VITALS — BP 122/65 | HR 56 | Temp 98.9°F

## 2023-07-19 DIAGNOSIS — Z452 Encounter for adjustment and management of vascular access device: Secondary | ICD-10-CM | POA: Insufficient documentation

## 2023-07-19 DIAGNOSIS — Z85118 Personal history of other malignant neoplasm of bronchus and lung: Secondary | ICD-10-CM | POA: Insufficient documentation

## 2023-07-19 DIAGNOSIS — Z95828 Presence of other vascular implants and grafts: Secondary | ICD-10-CM

## 2023-07-19 MED ORDER — SODIUM CHLORIDE 0.9% FLUSH
10.0000 mL | Freq: Once | INTRAVENOUS | Status: AC
  Administered 2023-07-19: 10 mL

## 2023-07-19 MED ORDER — HEPARIN SOD (PORK) LOCK FLUSH 100 UNIT/ML IV SOLN
500.0000 [IU] | Freq: Once | INTRAVENOUS | Status: AC
  Administered 2023-07-19: 500 [IU]

## 2023-07-27 ENCOUNTER — Telehealth: Payer: Self-pay | Admitting: Family Medicine

## 2023-07-27 DIAGNOSIS — G3184 Mild cognitive impairment, so stated: Secondary | ICD-10-CM

## 2023-07-27 DIAGNOSIS — Z430 Encounter for attention to tracheostomy: Secondary | ICD-10-CM

## 2023-07-27 NOTE — Telephone Encounter (Signed)
 Copied from CRM [id number]. Topic: General - Other >> Jul 27, 2023  9:29 AM Donald Frost wrote: Reason for CRM: Shaun Kennedy the patients daughter called in stating the provider previously tried to set up home health but it was never followed through with. The daughter is requesting a home health evaluation now. She is also faxing over paperwork concerning her being the POA and Medical Proxy. Jullie Oiler can be reached at [phone] if there are any further questions.

## 2023-07-27 NOTE — Telephone Encounter (Unsigned)
 Copied from CRM [id number]. Topic: General - Other >> Jul 27, 2023  9:29 AM Donald Frost wrote: Reason for CRM: Benetta Brake the patients daughter called in stating the provider previously tried to set up home health but it was never followed through with. The daughter is requesting a home health evaluation now. She is also faxing over paperwork concerning her being the POA and Medical Proxy. Jullie Oiler can be reached at [phone] if there are any further questions.

## 2023-08-01 ENCOUNTER — Ambulatory Visit (HOSPITAL_COMMUNITY)
Admission: RE | Admit: 2023-08-01 | Discharge: 2023-08-01 | Disposition: A | Discharge status: Home / Self Care | Source: Ambulatory Visit | Attending: Family Medicine | Admitting: Family Medicine

## 2023-08-01 DIAGNOSIS — Z930 Tracheostomy status: Secondary | ICD-10-CM

## 2023-08-01 NOTE — Progress Notes (Signed)
 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  I saw him last 9/19. He continues to follow w/ Onc. He has completed palliative XRT 2021, Last CT imaging raised concern for possible mets to bowel he completed  22 cycles of chemo in Jan 2024. Now on observation and thriving. Last CT imaging did NOT suggest further disease progression. Looks like last visit w/ heme/onc was 5/20 most recent scans remain w/out concern for disease progression  Presents today for planned trach change   Review of Systems  Constitutional: Negative.        Doing well. No distress. No new complaints   HENT:  Negative for congestion.        Reports recent seasonal allergies otherwise no complaints  Skin: Negative.    Exam  General 83 year old ambulatory male who walked into clinic in no distress HENT NCAT no JVD stoma essentially unremarkable. Speech good w/ PMV Pulm clear Card rrr Abd soft Ext warm  Neuro awake and alert   Procedure  The # 6 trach was removed. Site inspected. Jamal had as expected biofilm. New trach site placed over obturator. Site verified via ETCo2. No secretions pt tolerated well   Impression/plan  Active Problems:   Tracheostomy status (HCC)   Tracheostomy status (HCC) Overview:  Trach Brand: Shiley Size:  6.0 [id number] Style: Uncuffed Last change 6/24, today Discussion Stable trach, not a candidate for trach decannulation d/t subglottic stenosis  Plan Cont routine trach care ROV 4 weeks           My time 14 min  Maude FORBES Banner ACNP-BC [facility] Pulmonary/Critical Care Pager # [phone] OR # [phone] if no answer

## 2023-08-01 NOTE — Progress Notes (Signed)
 Tracheostomy Procedure Note  KNOX CERVI [id number] 20-Aug-1940  Pre Procedure Tracheostomy Information  Trach Brand: Shiley Size: 6.0  3LW24M Style: Uncuffed Secured by: Velcro   Procedure: Trach Change and Trach Cleaning    Post Procedure Tracheostomy Information  Trach Brand: Shiley Size: 6.0 3LW24M Style: Uncuffed Secured by: Velcro   Post Procedure Evaluation:  ETCO2 positive color change from yellow to purple : Yes.   Vital signs:VSS Patients current condition: stable Complications: No apparent complications Trach site exam: clean, dry Wound care done: 4 x 4 gauze  drain Patient did tolerate procedure well.   Education: none  Prescription needs: none    Additional needs: New PMV given to patient at this visit

## 2023-08-04 ENCOUNTER — Telehealth: Payer: Self-pay

## 2023-08-04 NOTE — Telephone Encounter (Signed)
 Copied from CRM [id number]. Topic: Clinical - Home Health Verbal Orders >> Aug 04, 2023  1:07 PM Antwanette L wrote: Patient daughter Buelah would like for Dr. Ladonde to put in a order for a home health nurse and other services the patient qualifies for. Please contact [facility] at [phone]

## 2023-08-08 ENCOUNTER — Ambulatory Visit: Admitting: Family Medicine

## 2023-08-08 NOTE — Telephone Encounter (Unsigned)
 Copied from CRM [id number]. Topic: Appointments - Appointment Cancel/Reschedule >> Aug 08, 2023  7:50 AM Donna BRAVO wrote: Patient/patient representative is calling to cancel or reschedule an appointment. Refer to attachments for appointment information.  Patient wife Ronal calling canceling appt will reschedule later

## 2023-08-14 ENCOUNTER — Telehealth: Payer: Self-pay | Admitting: Medical Oncology

## 2023-08-14 NOTE — Telephone Encounter (Signed)
 [name] confirmed [facility] appts for July.

## 2023-08-15 ENCOUNTER — Telehealth: Payer: Self-pay

## 2023-08-16 ENCOUNTER — Encounter: Payer: Self-pay | Admitting: Internal Medicine

## 2023-08-23 ENCOUNTER — Encounter: Payer: Self-pay | Admitting: Family Medicine

## 2023-08-23 ENCOUNTER — Encounter: Payer: Self-pay | Admitting: Internal Medicine

## 2023-08-23 ENCOUNTER — Ambulatory Visit: Admitting: Family Medicine

## 2023-08-23 VITALS — BP 102/50 | HR 67 | Wt 187.8 lb

## 2023-08-23 DIAGNOSIS — G3184 Mild cognitive impairment, so stated: Secondary | ICD-10-CM

## 2023-08-23 DIAGNOSIS — C3432 Malignant neoplasm of lower lobe, left bronchus or lung: Secondary | ICD-10-CM

## 2023-08-23 DIAGNOSIS — Z430 Encounter for attention to tracheostomy: Secondary | ICD-10-CM

## 2023-08-23 NOTE — Progress Notes (Signed)
   Subjective:    Patient ID: Shaun Kennedy, male    DOB: 1940/05/25, 83 y.o.   MRN: [id number]  HPI He is here with his wife to discuss further care.  She states that he needs help but is unclear as to exactly what kind of help is needed.  He can bathe and feed himself.  He does have a tracheostomy tube.  He does follow-up regularly with Dr. Gatha concerning his underlying lung cancer.  He is physically active and that he still plays golf.  His wife did mention that she does note some memory issues.   Review of Systems     Objective:    Physical Exam Alert and in no distress.  Breathing pattern is normal.  Tracheostomy tube is still present.       Assessment & Plan:  Mild cognitive impairment - Plan: AMB Referral VBCI Care Management  Malignant neoplasm of lower lobe of left lung (HCC) - Plan: AMB Referral VBCI Care Management  Tracheostomy care [facility]) - Plan: AMB Referral VBCI Care Management I will put the referral forward for home health assessment to see exactly what needs are available and needed.

## 2023-08-25 ENCOUNTER — Telehealth: Payer: Self-pay | Admitting: *Deleted

## 2023-08-25 ENCOUNTER — Telehealth: Payer: Self-pay

## 2023-08-25 NOTE — Progress Notes (Signed)
 Complex Care Management Note Care Guide Note  08/25/2023 Name: OSSIEL MARCHIO MRN: [id number] DOB: [birth year]  DAVIS AMBROSINI is a 83 y.o. year old male who is a primary care patient of [name], MD . The community resource team was consulted for assistance with Transportation Needs   SDOH screenings and interventions completed:  Yes     SDOH Interventions Today    Flowsheet Row Most Recent Value  SDOH Interventions   Transportation Interventions SCAT (Specialized Community Area Transporation)  [Will mail patient access [place] application and fax dr to his office]     Care guide performed the following interventions: Patient provided with information about care guide support team and interviewed to confirm resource needs.  Follow Up Plan:  No further follow up planned at this time. The patient has been provided with needed resources.  Encounter Outcome:  Patient Visit Completed  Nivin Braniff Greenauer-Moran  [facility] HealthPopulation Health Care Guide  Direct Dial:[phone] Fax:[phone] Website: [email or web address]

## 2023-08-25 NOTE — Progress Notes (Signed)
 Complex Care Management Note  Care Guide Note 08/25/2023 Name: Shaun Kennedy MRN: [id number] DOB: [birth year]  [name] is a 83 y.o. year old male who sees [name], MD for primary care. I reached out to Kayla LITTIE Balloon by phone today to offer complex care management services.  [name] was given information about Complex Care Management services today including:   The Complex Care Management services include support from the care team which includes your Nurse Care Manager, Clinical Social Worker, or Pharmacist.  The Complex Care Management team is here to help remove barriers to the health concerns and goals most important to you. Complex Care Management services are voluntary, and the patient may decline or stop services at any time by request to their care team member.   Complex Care Management Consent Status: Patient agreed to services and verbal consent obtained.   Follow up plan:  Telephone appointment with complex care management team member scheduled for:  09/11/2023 @ 11 AM  Encounter Outcome:  Patient Scheduled  Leotis Rase [facility], [facility] Guide  Direct Dial: [phone]  Fax [phone]

## 2023-08-30 ENCOUNTER — Ambulatory Visit (HOSPITAL_COMMUNITY)
Admission: RE | Admit: 2023-08-30 | Discharge: 2023-08-30 | Disposition: A | Discharge status: Home / Self Care | Source: Ambulatory Visit | Attending: Acute Care | Admitting: Acute Care

## 2023-08-30 DIAGNOSIS — Z930 Tracheostomy status: Secondary | ICD-10-CM | POA: Diagnosis not present

## 2023-08-30 NOTE — Progress Notes (Signed)
 Tracheostomy Procedure Note  JERICHO ALCORN [id number] 1940-11-03  Pre Procedure Tracheostomy Information  Trach Brand: Shiley Size: #6 3LW24M Style: Uncuffed Secured by: Velcro   Procedure: Trach education/care.    Post Procedure Tracheostomy Information  Trach Brand: Shiley Size: #6 [id number] Style: Uncuffed Secured by: Velcro   Post Procedure Evaluation:  ETCO2 positive color change from yellow to purple : Yes.   Vital signs: stable throughout Patients current condition: stable Complications: No apparent complications Trach site exam: clean, dry, no drainage Wound care done: dry, sterile, clean, and 4 x 4 gauze Patient did tolerate procedure well.   Education: Given by Jenna, CCM-NP  Prescription needs: None    Additional needs: New PMV given.

## 2023-09-06 ENCOUNTER — Inpatient Hospital Stay: Attending: Internal Medicine

## 2023-09-06 VITALS — BP 123/60 | HR 58 | Temp 97.7°F | Resp 18

## 2023-09-06 DIAGNOSIS — Z85118 Personal history of other malignant neoplasm of bronchus and lung: Secondary | ICD-10-CM | POA: Diagnosis not present

## 2023-09-06 DIAGNOSIS — Z452 Encounter for adjustment and management of vascular access device: Secondary | ICD-10-CM | POA: Diagnosis not present

## 2023-09-06 DIAGNOSIS — Z95828 Presence of other vascular implants and grafts: Secondary | ICD-10-CM

## 2023-09-06 MED ORDER — SODIUM CHLORIDE 0.9% FLUSH
10.0000 mL | Freq: Once | INTRAVENOUS | Status: AC
  Administered 2023-09-06: 10 mL

## 2023-09-06 MED ORDER — HEPARIN SOD (PORK) LOCK FLUSH 100 UNIT/ML IV SOLN
500.0000 [IU] | Freq: Once | INTRAVENOUS | Status: AC
Start: 2023-09-06 — End: 2023-09-06
  Administered 2023-09-06: 500 [IU]

## 2023-09-11 ENCOUNTER — Other Ambulatory Visit: Payer: Self-pay | Admitting: Licensed Clinical Social Worker

## 2023-09-12 NOTE — Patient Instructions (Signed)
 Visit Information  Thank you for taking time to visit with me today. Please don't hesitate to contact me if I can be of assistance to you before our next scheduled appointment.  Our next appointment is by telephone on 09/08 at 11 AM Please call the care guide team at [phone] if you need to cancel or reschedule your appointment.   Following is a copy of your care plan:   Goals Addressed             This Visit's Progress    LCSW VBCI Social Work Care Plan   On track    Problems:   Disease Management support and education needs related to Stress at managing health condition  CSW Clinical Goal(s):   Over the next 60 days the Patient will attend all scheduled medical appointments as evidenced by patient report and care team review of appointment completion in electronic MEDICAL RECORD NUMBER  demonstrate a reduction in symptoms related to Stress at Managing health condition .  Interventions:  Mental Health:  Evaluation of current treatment plan related to Stress at Managing health condition Active listening / Reflection utilized Emotional Support Provided Mindfulness or Relaxation training provided  Patient Goals/Self-Care Activities:  Continue taking your medication as prescribed.   Increase coping skills and healthy habits  Plan:   Telephone follow up appointment with care management team member scheduled for:  4 weeks        Please call the Suicide and Crisis Lifeline: 988 go to [facility] Urgent [facility] [address], [place] [phone]) call 911 if you are experiencing a Mental Health or Behavioral Health Crisis or need someone to talk to.  Patient verbalizes understanding of instructions and care plan provided today and agrees to view in MyChart. Active MyChart status and patient understanding of how to access instructions and care plan via MyChart confirmed with patient.     Rolin Ezzard HUGHS [facility]  [facility], [facility] Clinical Social Worker Direct Dial: [phone]  Fax: [phone] Website: [email or web address] 8:20 AM

## 2023-09-12 NOTE — Patient Outreach (Signed)
 Complex Care Management   Visit Note  09/11/2023  Name:  Shaun Kennedy MRN: [id number] DOB: [birth year]  Situation: Referral received for Complex Care Management related to Mild Cognitive Impairment I obtained verbal consent from Patient.  Visit completed with pt and spouse  on the phone  Background:   Past Medical History:  Diagnosis Date   Allergic rhinitis    Asthma    Carotid stenosis    Colon cancer (HCC) 2003   Diabetes mellitus (HCC)    Diverticulosis    Dyslipidemia    ED (erectile dysfunction)    GERD (gastroesophageal reflux disease)    H/O degenerative disc disease    Hemorrhoids    HTN (hypertension)    as a child   Hyperlipidemia    Lung cancer (HCC)    LVH (left ventricular hypertrophy)    on EKG   Mass of lower lobe of left lung    Mediastinal adenopathy    Smoker    former   Wears dentures    Wears dentures    Wears glasses    Wears glasses     Assessment: Patient Reported Symptoms:  Cognitive Cognitive Status: Alert and oriented to person, place, and time, Normal speech and language skills   Health Maintenance Behaviors: Annual physical exam  Neurological Neurological Review of Symptoms: No symptoms reported    HEENT HEENT Symptoms Reported: No symptoms reported      Cardiovascular Cardiovascular Symptoms Reported: No symptoms reported    Respiratory Respiratory Symptoms Reported: No symptoms reported    Endocrine Endocrine Symptoms Reported: No symptoms reported    Gastrointestinal Gastrointestinal Symptoms Reported: No symptoms reported      Genitourinary Genitourinary Symptoms Reported: No symptoms reported    Integumentary Integumentary Symptoms Reported: No symptoms reported    Musculoskeletal Musculoskelatal Symptoms Reviewed: No symptoms reported   Falls in the past year?: No Number of falls in past year: 1 or less Was there an injury with Fall?: No Fall Risk Category Calculator: 0 Patient Fall Risk Level: Low Fall Risk     Psychosocial Psychosocial Symptoms Reported: No symptoms reported Behavioral Management Strategies: Adequate rest, Support system, Coping strategies Major Change/Loss/Stressor/Fears (CP): Medical condition, self Techniques to Cope with Loss/Stress/Change: Diversional activities, Spiritual practice(s) Quality of Family Relationships: involved, supportive Do you feel physically threatened by others?: No      06/14/2023   10:44 AM  Depression screen PHQ 2/9  Decreased Interest 0  Down, Depressed, Hopeless 0  PHQ - 2 Score 0    There were no vitals filed for this visit.  Medications Reviewed Today     Reviewed by Amare Bail D, LCSW (Social Worker) on 09/11/23 at 1117  Med List Status: <None>   Medication Order Taking? Sig Documenting Provider Last Dose Status Informant  Alcohol Swabs (DROPSAFE ALCOHOL PREP) 70 % PADS [id number]  Apply topically. [provider]  Active Spouse/Significant Other  amLODipine  (NORVASC ) 5 MG tablet [id number]  TAKE 1 TABLET (5 MG TOTAL) BY MOUTH DAILY. [name], MD  Active   aspirin  EC 81 MG tablet [id number]  Take 81 mg by mouth daily after breakfast.  [provider]  Active Spouse/Significant Other  bisacodyl (DULCOLAX) 5 MG EC tablet [id number]  Take 10 mg by mouth daily as needed for moderate constipation. [provider]  Active Spouse/Significant Other           Med Note BEVERLEE LUCIENNE JULIANNA Stevan Oct 05, 2022 10:51 AM) As needed  Blood  Glucose Monitoring Suppl DEVI [id number]  1 each by Does not apply route in the morning, at noon, and at bedtime. May substitute to any manufacturer covered by patient's insurance. [name], MD  Active            Med Note MAPLE, RILEY C   Wed Jun 14, 2023 10:42 AM) Needs refill  cholecalciferol  (VITAMIN D ) 25 MCG (1000 UNIT) tablet [id number]  Take 1,000 Units by mouth daily. [provider]  Active Spouse/Significant Other  donepezil  (ARICEPT ) 10 MG tablet [id number]  Take 1  tablet (10 mg total) by mouth at bedtime. [name], MD  Active   ferrous sulfate  325 (65 FE) MG EC tablet [id number]  TAKE 1 TABLET EVERY DAY WITH BREAKFAST Lalonde, John C, MD  Active   hydrOXYzine  (ATARAX ) 10 MG tablet [id number]  Take 1 tablet (10 mg total) by mouth 3 (three) times daily as needed. Sherrod Sherrod, MD  Active            Med Note BEVERLEE, LUCIENNE JULIANNA [place] Oct 05, 2022 10:50 AM) As needed  lidocaine -prilocaine  (EMLA ) cream [id number]  Apply 1 Application topically as needed. Sherrod Sherrod, MD  Active            Med Note MAPLE, RILEY C   Wed Jun 14, 2023 10:43 AM) Needs refill  lisinopril -hydrochlorothiazide  (ZESTORETIC ) 20-12.5 MG tablet [id number]  Take 1 tablet by mouth daily. [name], MD  Active   memantine  (NAMENDA ) 10 MG tablet [id number]  TAKE 1 TABLET TWICE DAILY [name], MD  Active   metFORMIN  (GLUCOPHAGE -XR) 500 MG 24 hr tablet [id number]  Take 1 tablet (500 mg total) by mouth daily. Lalonde, John C, MD  Active   Multiple Vitamin (MULTIVITAMIN WITH MINERALS) TABS tablet [id number]  Take 1 tablet by mouth daily after breakfast.  [provider]  Active Spouse/Significant Other  simvastatin  (ZOCOR ) 40 MG tablet [id number]  Take 1 tablet (40 mg total) by mouth daily. Lalonde, John C, MD  Active   solifenacin  (VESICARE ) 5 MG tablet [id number]  Take 1 tablet (5 mg total) by mouth daily.  Patient not taking: Reported on 08/23/2023   [name], MD  Active             Recommendation:   Continue Current Plan of Care  Follow Up Plan:   Telephone follow-up in 1 month  Rolin Kerns, LCSW [facility]  [facility], [facility] Clinical Social Worker Direct Dial: [phone]  Fax: [phone] Website: [email or web address] 8:19 AM

## 2023-09-19 ENCOUNTER — Telehealth: Payer: Self-pay

## 2023-09-19 DIAGNOSIS — T162XXA Foreign body in left ear, initial encounter: Secondary | ICD-10-CM | POA: Diagnosis not present

## 2023-09-19 NOTE — Progress Notes (Signed)
 Complex Care Management Care Guide Note  09/19/2023 Name: [name] MRN: [id number] DOB: 12/28/1940  Shaun Kennedy is a 83 y.o. year old male who is a primary care patient of [name], MD and is actively engaged with the care management team. I reached out to Kayla LITTIE Balloon by phone today to assist with re-scheduling  with the RN Case Manager.  Follow up plan: Unsuccessful telephone outreach attempt made. A HIPAA compliant phone message was left for the patient providing contact information and requesting a return call.  Leotis Rase [facility], [facility] Guide  Direct Dial: [phone]  Fax [phone]

## 2023-10-16 ENCOUNTER — Other Ambulatory Visit: Payer: Self-pay | Admitting: Licensed Clinical Social Worker

## 2023-10-16 NOTE — Patient Instructions (Signed)
 Visit Information  Thank you for taking time to visit with me today. Please don't hesitate to contact me if I can be of assistance to you before our next scheduled appointment.  Patient has met all care management goals. Care Management case will be closed. Patient has been provided contact information should new needs arise.   Please call the care guide team at [phone] if you need to cancel, schedule, or reschedule an appointment.   Please call the Suicide and Crisis Lifeline: 988 go to [facility] Urgent [facility] [address] [place] [phone]) call 911 if you are experiencing a Mental Health or Behavioral Health Crisis or need someone to talk to.  Rolin Kerns, LCSW [place]  [facility], [facility] Clinical Social Worker Direct Dial: [phone]  Fax: [phone] Website: [email or web address] 5:03 PM

## 2023-10-25 ENCOUNTER — Inpatient Hospital Stay: Attending: Internal Medicine

## 2023-10-26 ENCOUNTER — Other Ambulatory Visit: Payer: Self-pay

## 2023-10-26 MED ORDER — BLOOD GLUCOSE TEST VI STRP: 1.0000 | ORAL_STRIP | Freq: Three times a day (TID) | 2 refills | Status: DC

## 2023-10-26 MED ORDER — LANCET DEVICE MISC: 1.0000 | Freq: Three times a day (TID) | 0 refills | Status: DC

## 2023-10-26 MED ORDER — BLOOD GLUCOSE MONITORING SUPPL DEVI: 1.0000 | Freq: Three times a day (TID) | 0 refills | Status: DC

## 2023-10-26 MED ORDER — LANCETS MISC. MISC: 1.0000 | Freq: Three times a day (TID) | 2 refills | Status: DC

## 2023-10-26 NOTE — Patient Outreach (Signed)
 Complex Care Management   Visit Note  10/26/2023  Name:  Shaun Kennedy MRN: [id number] DOB: 05-Jan-1941  Situation: Referral received for Complex Care Management related to Hypertension associated with diabetes, Atherosclerosis of aorta, Glottis stenosis, Tracheostomy, Malignant neoplasm of lower lobe of left lung, type 2 Diabetes, non-insulin  dependent, mild cognitive impairment. I obtained verbal consent from Patient.  Visit completed with Patient on the phone.  Background:   Past Medical History:  Diagnosis Date   Allergic rhinitis    Asthma    Carotid stenosis    Colon cancer (HCC) 2003   Diabetes mellitus (HCC)    Diverticulosis    Dyslipidemia    ED (erectile dysfunction)    GERD (gastroesophageal reflux disease)    H/O degenerative disc disease    Hemorrhoids    HTN (hypertension)    as a child   Hyperlipidemia    Lung cancer (HCC)    LVH (left ventricular hypertrophy)    on EKG   Mass of lower lobe of left lung    Mediastinal adenopathy    Smoker    former   Wears dentures    Wears dentures    Wears glasses    Wears glasses     Assessment: Patient Reported Symptoms:  Cognitive Cognitive Status: Normal speech and language skills, Alert and oriented to person, place, and time      Neurological Neurological Review of Symptoms: No symptoms reported    HEENT HEENT Symptoms Reported: No symptoms reported      Cardiovascular Cardiovascular Symptoms Reported: No symptoms reported    Respiratory Respiratory Symptoms Reported: Other: Other Respiratory Symptoms: patient has tracheostomy Additional Respiratory Details: trach gets changes out per Dr. Carlie q 6 weeks    Endocrine Endocrine Symptoms Reported: No symptoms reported Is patient diabetic?: Yes Is patient checking blood sugars at home?: Yes List most recent blood sugar readings, include date and time of day: FBS not checked today Endocrine Self-Management Outcome: 4 (good)  Gastrointestinal  Gastrointestinal Symptoms Reported: No symptoms reported      Genitourinary Genitourinary Symptoms Reported: No symptoms reported    Integumentary Integumentary Symptoms Reported: Itching Skin Management Strategies: Medication therapy, Routine screening Skin Self-Management Outcome: 3 (uncertain)  Musculoskeletal Musculoskelatal Symptoms Reviewed: No symptoms reported   Falls in the past year?: No Number of falls in past year: 1 or less Was there an injury with Fall?: No Fall Risk Category Calculator: 0 Patient Fall Risk Level: Low Fall Risk Patient at Risk for Falls Due to: No Fall Risks Fall risk Follow up: Falls evaluation completed, Education provided, Falls prevention discussed  Psychosocial Psychosocial Symptoms Reported: No symptoms reported   Major Change/Loss/Stressor/Fears (CP): Denies Quality of Family Relationships: supportive, helpful, involved Do you feel physically threatened by others?: No    10/26/2023    PHQ2-9 Depression Screening   Rachell Druckenmiller interest or pleasure in doing things Not at all  Feeling down, depressed, or hopeless Not at all  PHQ-2 - Total Score 0  Trouble falling or staying asleep, or sleeping too much    Feeling tired or having Sundeep Cary energy    Poor appetite or overeating     Feeling bad about yourself - or that you are a failure or have let yourself or your family down    Trouble concentrating on things, such as reading the newspaper or watching television    Moving or speaking so slowly that other people could have noticed.  Or the opposite - being so fidgety or restless  that you have been moving around a lot more than usual    Thoughts that you would be better off dead, or hurting yourself in some way    PHQ2-9 Total Score    If you checked off any problems, how difficult have these problems made it for you to do your work, take care of things at home, or get along with other people    Depression Interventions/Treatment      There were no  vitals filed for this visit.  Medications Reviewed Today     Reviewed by Morgan Clayborne CROME, RN (Registered Nurse) on 10/26/23 at 1110  Med List Status: <None>   Medication Order Taking? Sig Documenting Provider Last Dose Status Informant  Alcohol Swabs (DROPSAFE ALCOHOL PREP) 70 % PADS [id number]  Apply topically. [provider]  Active Spouse/Significant Other  amLODipine  (NORVASC ) 5 MG tablet [id number] Yes TAKE 1 TABLET (5 MG TOTAL) BY MOUTH DAILY. [name], MD  Active   aspirin  EC 81 MG tablet [id number] Yes Take 81 mg by mouth daily after breakfast.  [provider]  Active Spouse/Significant Other  bisacodyl (DULCOLAX) 5 MG EC tablet [id number] Yes Take 10 mg by mouth daily as needed for moderate constipation. [provider]  Active Spouse/Significant Other           Med Note BEVERLEE LUCIENNE JULIANNA Stevan Oct 05, 2022 10:51 AM) As needed  Blood Glucose Monitoring Suppl DEVI [id number]  1 each by Does not apply route in the morning, at noon, and at bedtime. May substitute to any manufacturer covered by patient's insurance. [name], MD  Active            Med Note MAPLE, RILEY C   Wed Jun 14, 2023 10:42 AM) Needs refill  cholecalciferol  (VITAMIN D ) 25 MCG (1000 UNIT) tablet [id number] Yes Take 1,000 Units by mouth daily. [provider]  Active Spouse/Significant Other  donepezil  (ARICEPT ) 10 MG tablet [id number] Yes Take 1 tablet (10 mg total) by mouth at bedtime. [name], MD  Active   ferrous sulfate  325 (65 FE) MG EC tablet [id number] Yes TAKE 1 TABLET EVERY DAY WITH BREAKFAST Lalonde, John C, MD  Active   hydrOXYzine  (ATARAX ) 10 MG tablet [id number] Yes Take 1 tablet (10 mg total) by mouth 3 (three) times daily as needed. Sherrod Sherrod, MD  Active            Med Note BEVERLEE, LUCIENNE JULIANNA Stevan Oct 05, 2022 10:50 AM) As needed  lidocaine -prilocaine  (EMLA ) cream [id number] Yes Apply 1 Application topically as needed. Sherrod Sherrod, MD  Active             Med Note MAPLE, CARLO BROCKS   Wed Jun 14, 2023 10:43 AM) Needs refill  lisinopril -hydrochlorothiazide  (ZESTORETIC ) 20-12.5 MG tablet [id number] Yes Take 1 tablet by mouth daily. [name], MD  Active   memantine  (NAMENDA ) 10 MG tablet [id number] Yes TAKE 1 TABLET TWICE DAILY Lalonde, John C, MD  Active   metFORMIN  (GLUCOPHAGE -XR) 500 MG 24 hr tablet [id number] Yes Take 1 tablet (500 mg total) by mouth daily. Lalonde, John C, MD  Active   Multiple Vitamin (MULTIVITAMIN WITH MINERALS) TABS tablet [id number] Yes Take 1 tablet by mouth daily after breakfast.  [provider]  Active Spouse/Significant Other  simvastatin  (ZOCOR ) 40 MG tablet [id number] Yes Take 1 tablet (40 mg total) by mouth daily. [name], MD  Active   solifenacin  (VESICARE ) 5  MG tablet [id number] Yes Take 1 tablet (5 mg total) by mouth daily. [name], MD  Active             Recommendation:   PCP Follow-up with Dr. Reyne Bustle on 12/18/23 at 9:15 AM Specialty provider follow-up with Oncology on 12/19/23 at 1:00 PM for labs and port flush; 12/26/23 at 1:30 PM for an in person visit with Dr. Sherrod   Follow Up Plan:   Telephone follow up appointment date/time:  Friday, October 10 at 2:15 PM  Clayborne Ly RN BSN CCM [place]  [facility], [facility] Health Nurse Care Coordinator  Direct Dial: [phone] Website: [email or web address]

## 2023-10-26 NOTE — Patient Instructions (Signed)
 Visit Information  Thank you for taking time to visit with me today. Please don't hesitate to contact me if I can be of assistance to you before our next scheduled appointment.  Our next appointment is by telephone on Friday, October 10 at 2:15 PM Please call the care guide team at [phone] if you need to cancel or reschedule your appointment.   Following is a copy of your care plan:   Goals Addressed             This Visit's Progress    COMPLETED: To have right foot callus resolve without complications       Care Coordination Interventions: Evaluation of current treatment plan related to right foot callus and patient's adherence to plan as established by provider Discussed with patient today, he inspects his feet daily, currently patient has no concerns related to his feet, he has a Podiatrist if needed  Instructed patient to keep his doctor informed of new symptoms or concerns     COMPLETED: To keep A1c <7.0 %       Please see new goal     VBCI RN Care Plan related to Malignant neoplasm of unspecified part of unspecified bronchus or lung       Problems:  Chronic Disease Management support and education needs related to Malignant neoplasm of unspecified part of unspecified bronchus or lung  Goal: Over the next 60 days the Patient will continue to work with Medical illustrator and/or Social Worker to address care management and care coordination needs related to Malignant neoplasm of unspecified part of unspecified bronchus or lung as evidenced by adherence to care management team scheduled appointments      Interventions:   Oncology: Assessment of understanding of oncology diagnosis: Malignant neoplasm of unspecified part of unspecified bronchus or lung  Assessed patient understanding of cancer diagnosis and recommended treatment plan Reviewed upcoming provider appointments and treatment appointments Assessed available transportation to appointments and treatments. Has  consistent/reliable transportation: Yes Assessed support system. Has consistent/reliable family or other support: Yes ASSESSMENT AND PLAN: This is a very pleasant 83 years old [name] with stage IV non-small cell lung cancer, squamous cell carcinoma with negative PD-L1 expression. The patient underwent systemic chemotherapy with carboplatin  and paclitaxel  for 2 cycles in addition to immunotherapy with ipilimumab  1 mg/KG every 6 weeks and nivolumab  360 mg IV every 3 weeks status post 22 cycles. The patient is currently on observation and he is feeling fine with no concerning complaints. He had repeat CT scan of the chest, abdomen and pelvis performed recently.  I personally and independently reviewed the scan and discussed the results with the patient today.  His scan showed no concerning findings for disease recurrence or metastasis. - Schedule follow-up appointment in six months. - Order CT scan of chest, abdomen, and pelvis one week prior to next appointment. - Perform laboratory tests before next appointment. - Flush Port-a-Cath every two months.   Tracheostomy status Tracheostomy in place without issues, but he requires additional supplies. Advised to contact ENT specialist for assistance with supplies and management. - Advise him to contact ENT specialist for tracheostomy supplies and management.   The patient was advised to call immediately if he has any other concerning symptoms in the interval. The patient voices understanding of current disease status and treatment options and is in agreement with the current care plan.  Patient Self-Care Activities:  Attend all scheduled provider appointments Call pharmacy for medication refills 3-7 days in advance of  running out of medications Call provider office for new concerns or questions  Take medications as prescribed   Work with the nurse care manager to address care coordination needs and will continue to work with the clinical  team to address health care and disease management related needs  Recommendation:   PCP Follow-up with Dr. Reyne Bustle on 12/18/23 at 9:15 AM Specialty provider follow-up with Oncology on 12/19/23 at 1:00 PM for labs and port flush; 12/26/23 at 1:30 PM for an in person visit with Dr. Sherrod    Follow Up Plan:   Telephone follow up appointment date/time:  Friday, October 10 at 2:15 PM         VBCI RN Care Plan related to type 2 Diabetes non-insulin  dependent       Problems:  Chronic Disease Management support and education needs related to DMII  Goal: Over the next 60 days the Patient will continue to work with Medical illustrator and/or Social Worker to address care management and care coordination needs related to DMII as evidenced by adherence to care management team scheduled appointments      Interventions:   Diabetes Interventions: Assessed patient's understanding of A1c goal: <6.5% Reviewed medications with patient and medication reconciliation was completed  Assessed for signs/symptoms related to hypo and hyperglycemia and patient denies having symptoms Advised patient, providing education and rationale, to check cbg daily before breakfast and record, calling PCP for findings outside established parameters Assessed social determinant of health barriers Sent in basket message to PCP provider requesting an Rx for a new glucometer due to patient's is outdated and not working correctly  Sent in basket message to PCP advising patient is c/o of chronic itching despite using moisturizer daily and taking Hydroxyzine  tid  Lab Results  Component Value Date   HGBA1C 6.3 (A) 06/14/2023    Patient Self-Care Activities:  Attend all scheduled provider appointments Call pharmacy for medication refills 3-7 days in advance of running out of medications Call provider office for new concerns or questions  Take medications as prescribed   check blood sugar at prescribed times: once daily and when  you have symptoms of low or high blood sugar check feet daily for cuts, sores or redness trim toenails straight across drink 6 to 8 glasses of water each day wash and dry feet carefully every day wear comfortable, cotton socks wear comfortable, well-fitting shoes  Recommendation:   PCP Follow-up with Dr. Reyne Bustle on 12/18/23 at 9:15 AM Specialty provider follow-up with Oncology on 12/19/23 at 1:00 PM for labs and port flush; 12/26/23 at 1:30 PM for an in person visit with Dr. Sherrod    Follow Up Plan:   Telephone follow up appointment date/time:  Friday, October 10 at 2:15 PM             Please call 1-800-273-TALK (toll free, 24 hour hotline) if you are experiencing a Mental Health or Behavioral Health Crisis or need someone to talk to.  Patient verbalizes understanding of instructions and care plan provided today and agrees to view in MyChart. Active MyChart status and patient understanding of how to access instructions and care plan via MyChart confirmed with patient.     Clayborne Ly RN BSN CCM [place]  [facility], [facility] Health Nurse Care Coordinator  Direct Dial: [phone] Website: [email or web address]

## 2023-10-30 ENCOUNTER — Inpatient Hospital Stay (HOSPITAL_COMMUNITY)
Admission: EM | Admit: 2023-10-30 | Discharge: 2023-11-03 | DRG: 308 | Disposition: A | Discharge status: Home / Self Care | Attending: Internal Medicine | Admitting: Internal Medicine

## 2023-10-30 ENCOUNTER — Emergency Department (HOSPITAL_COMMUNITY)

## 2023-10-30 ENCOUNTER — Other Ambulatory Visit: Payer: Self-pay

## 2023-10-30 DIAGNOSIS — Z85038 Personal history of other malignant neoplasm of large intestine: Secondary | ICD-10-CM | POA: Diagnosis not present

## 2023-10-30 DIAGNOSIS — I152 Hypertension secondary to endocrine disorders: Secondary | ICD-10-CM | POA: Diagnosis present

## 2023-10-30 DIAGNOSIS — E1159 Type 2 diabetes mellitus with other circulatory complications: Secondary | ICD-10-CM | POA: Diagnosis present

## 2023-10-30 DIAGNOSIS — Z452 Encounter for adjustment and management of vascular access device: Secondary | ICD-10-CM | POA: Diagnosis not present

## 2023-10-30 DIAGNOSIS — Z801 Family history of malignant neoplasm of trachea, bronchus and lung: Secondary | ICD-10-CM | POA: Diagnosis not present

## 2023-10-30 DIAGNOSIS — Z800 Family history of malignant neoplasm of digestive organs: Secondary | ICD-10-CM

## 2023-10-30 DIAGNOSIS — K529 Noninfective gastroenteritis and colitis, unspecified: Secondary | ICD-10-CM | POA: Diagnosis present

## 2023-10-30 DIAGNOSIS — Z87891 Personal history of nicotine dependence: Secondary | ICD-10-CM | POA: Diagnosis not present

## 2023-10-30 DIAGNOSIS — E785 Hyperlipidemia, unspecified: Secondary | ICD-10-CM | POA: Diagnosis present

## 2023-10-30 DIAGNOSIS — Z9221 Personal history of antineoplastic chemotherapy: Secondary | ICD-10-CM | POA: Diagnosis not present

## 2023-10-30 DIAGNOSIS — E1169 Type 2 diabetes mellitus with other specified complication: Secondary | ICD-10-CM | POA: Diagnosis present

## 2023-10-30 DIAGNOSIS — N1831 Chronic kidney disease, stage 3a: Secondary | ICD-10-CM | POA: Diagnosis present

## 2023-10-30 DIAGNOSIS — Z7982 Long term (current) use of aspirin: Secondary | ICD-10-CM | POA: Diagnosis not present

## 2023-10-30 DIAGNOSIS — Z930 Tracheostomy status: Secondary | ICD-10-CM

## 2023-10-30 DIAGNOSIS — E1122 Type 2 diabetes mellitus with diabetic chronic kidney disease: Secondary | ICD-10-CM | POA: Diagnosis present

## 2023-10-30 DIAGNOSIS — Z85118 Personal history of other malignant neoplasm of bronchus and lung: Secondary | ICD-10-CM

## 2023-10-30 DIAGNOSIS — I442 Atrioventricular block, complete: Principal | ICD-10-CM | POA: Diagnosis present

## 2023-10-30 DIAGNOSIS — I700 Atherosclerosis of aorta: Secondary | ICD-10-CM | POA: Diagnosis not present

## 2023-10-30 DIAGNOSIS — N179 Acute kidney failure, unspecified: Secondary | ICD-10-CM | POA: Diagnosis present

## 2023-10-30 DIAGNOSIS — R079 Chest pain, unspecified: Secondary | ICD-10-CM | POA: Diagnosis not present

## 2023-10-30 DIAGNOSIS — Z7984 Long term (current) use of oral hypoglycemic drugs: Secondary | ICD-10-CM | POA: Diagnosis not present

## 2023-10-30 DIAGNOSIS — R112 Nausea with vomiting, unspecified: Secondary | ICD-10-CM | POA: Diagnosis not present

## 2023-10-30 DIAGNOSIS — J690 Pneumonitis due to inhalation of food and vomit: Secondary | ICD-10-CM | POA: Diagnosis present

## 2023-10-30 DIAGNOSIS — I959 Hypotension, unspecified: Secondary | ICD-10-CM | POA: Diagnosis not present

## 2023-10-30 DIAGNOSIS — Z923 Personal history of irradiation: Secondary | ICD-10-CM | POA: Diagnosis not present

## 2023-10-30 DIAGNOSIS — R55 Syncope and collapse: Principal | ICD-10-CM | POA: Diagnosis present

## 2023-10-30 DIAGNOSIS — J386 Stenosis of larynx: Secondary | ICD-10-CM | POA: Diagnosis present

## 2023-10-30 DIAGNOSIS — R42 Dizziness and giddiness: Secondary | ICD-10-CM | POA: Diagnosis not present

## 2023-10-30 DIAGNOSIS — Z823 Family history of stroke: Secondary | ICD-10-CM

## 2023-10-30 DIAGNOSIS — J189 Pneumonia, unspecified organism: Secondary | ICD-10-CM | POA: Diagnosis not present

## 2023-10-30 DIAGNOSIS — N183 Chronic kidney disease, stage 3 unspecified: Secondary | ICD-10-CM | POA: Insufficient documentation

## 2023-10-30 DIAGNOSIS — Z8249 Family history of ischemic heart disease and other diseases of the circulatory system: Secondary | ICD-10-CM | POA: Diagnosis not present

## 2023-10-30 DIAGNOSIS — Z79899 Other long term (current) drug therapy: Secondary | ICD-10-CM

## 2023-10-30 DIAGNOSIS — E119 Type 2 diabetes mellitus without complications: Secondary | ICD-10-CM

## 2023-10-30 DIAGNOSIS — Z833 Family history of diabetes mellitus: Secondary | ICD-10-CM

## 2023-10-30 DIAGNOSIS — R918 Other nonspecific abnormal finding of lung field: Secondary | ICD-10-CM | POA: Diagnosis not present

## 2023-10-30 DIAGNOSIS — R0789 Other chest pain: Secondary | ICD-10-CM | POA: Diagnosis not present

## 2023-10-30 LAB — COMPREHENSIVE METABOLIC PANEL WITH GFR
ALT: 13 U/L (ref 0–44)
AST: 20 U/L (ref 15–41)
Albumin: 3.1 g/dL — ABNORMAL LOW (ref 3.5–5.0)
Alkaline Phosphatase: 45 U/L (ref 38–126)
Anion gap: 13 (ref 5–15)
BUN: 23 mg/dL (ref 8–23)
CO2: 20 mmol/L — ABNORMAL LOW (ref 22–32)
Calcium: 8.8 mg/dL — ABNORMAL LOW (ref 8.9–10.3)
Chloride: 104 mmol/L (ref 98–111)
Creatinine, Ser: 1.71 mg/dL — ABNORMAL HIGH (ref 0.61–1.24)
GFR, Estimated: 39 mL/min — ABNORMAL LOW (ref >60)
Glucose, Bld: 99 mg/dL (ref 70–99)
Potassium: 3.6 mmol/L (ref 3.5–5.1)
Sodium: 137 mmol/L (ref 135–145)
Total Bilirubin: 1.2 mg/dL (ref 0.0–1.2)
Total Protein: 7.4 g/dL (ref 6.5–8.1)

## 2023-10-30 LAB — D-DIMER, QUANTITATIVE: D-Dimer, Quant: >20 ug{FEU}/mL — ABNORMAL HIGH (ref 0.00–0.50)

## 2023-10-30 LAB — CBC
HCT: 37.5 % — ABNORMAL LOW (ref 39.0–52.0)
Hemoglobin: 12.1 g/dL — ABNORMAL LOW (ref 13.0–17.0)
MCH: 31 pg (ref 26.0–34.0)
MCHC: 32.3 g/dL (ref 30.0–36.0)
MCV: 96.2 fL (ref 80.0–100.0)
Platelets: 335 K/uL (ref 150–400)
RBC: 3.9 MIL/uL — ABNORMAL LOW (ref 4.22–5.81)
RDW: 12.4 % (ref 11.5–15.5)
WBC: 12 K/uL — ABNORMAL HIGH (ref 4.0–10.5)
nRBC: 0 % (ref 0.0–0.2)

## 2023-10-30 LAB — I-STAT CHEM 8, ED
BUN: 25 mg/dL — ABNORMAL HIGH (ref 8–23)
Calcium, Ion: 1.14 mmol/L — ABNORMAL LOW (ref 1.15–1.40)
Chloride: 107 mmol/L (ref 98–111)
Creatinine, Ser: 1.8 mg/dL — ABNORMAL HIGH (ref 0.61–1.24)
Glucose, Bld: 103 mg/dL — ABNORMAL HIGH (ref 70–99)
HCT: 37 % — ABNORMAL LOW (ref 39.0–52.0)
Hemoglobin: 12.6 g/dL — ABNORMAL LOW (ref 13.0–17.0)
Potassium: 3.7 mmol/L (ref 3.5–5.1)
Sodium: 140 mmol/L (ref 135–145)
TCO2: 21 mmol/L — ABNORMAL LOW (ref 22–32)

## 2023-10-30 LAB — LIPASE, BLOOD: Lipase: 34 U/L (ref 11–51)

## 2023-10-30 LAB — TSH: TSH: 2.196 u[IU]/mL (ref 0.350–4.500)

## 2023-10-30 LAB — I-STAT CG4 LACTIC ACID, ED: Lactic Acid, Venous: 1.2 mmol/L (ref 0.5–1.9)

## 2023-10-30 LAB — MAGNESIUM: Magnesium: 1.8 mg/dL (ref 1.7–2.4)

## 2023-10-30 LAB — TROPONIN I (HIGH SENSITIVITY)
Troponin I (High Sensitivity): 8 ng/L (ref <18)
Troponin I (High Sensitivity): 12 ng/L (ref <18)

## 2023-10-30 MED ORDER — SODIUM CHLORIDE 0.9 % IV BOLUS
1000.0000 mL | Freq: Once | INTRAVENOUS | Status: AC
Start: 2023-10-30 — End: 2023-10-31
  Administered 2023-10-30: 1000 mL via INTRAVENOUS

## 2023-10-30 MED ORDER — SODIUM CHLORIDE 0.9 % IV BOLUS
1000.0000 mL | Freq: Once | INTRAVENOUS | Status: AC
  Administered 2023-10-30: 1000 mL via INTRAVENOUS

## 2023-10-30 MED ORDER — ONDANSETRON HCL 4 MG/2ML IJ SOLN: 4.0000 mg | Freq: Four times a day (QID) | INTRAMUSCULAR | Status: DC | PRN

## 2023-10-30 MED ORDER — ACETAMINOPHEN 325 MG PO TABS
650.0000 mg | ORAL_TABLET | Freq: Four times a day (QID) | ORAL | Status: DC | PRN
  Filled 2023-10-30: qty 2

## 2023-10-30 MED ORDER — MELATONIN 3 MG PO TABS: 3.0000 mg | ORAL_TABLET | Freq: Every evening | ORAL | Status: DC | PRN

## 2023-10-30 MED ORDER — POTASSIUM CHLORIDE CRYS ER 20 MEQ PO TBCR
40.0000 meq | EXTENDED_RELEASE_TABLET | Freq: Once | ORAL | Status: AC
  Administered 2023-10-30: 40 meq via ORAL
  Filled 2023-10-30: qty 2

## 2023-10-30 MED ORDER — IOHEXOL 350 MG/ML SOLN
75.0000 mL | Freq: Once | INTRAVENOUS | Status: AC | PRN
  Administered 2023-10-30: 75 mL via INTRAVENOUS

## 2023-10-30 MED ORDER — PIPERACILLIN-TAZOBACTAM 3.375 G IVPB 30 MIN
3.3750 g | Freq: Once | INTRAVENOUS | Status: AC
Start: 2023-10-30 — End: 2023-10-31
  Administered 2023-10-30: 3.375 g via INTRAVENOUS
  Filled 2023-10-30: qty 50

## 2023-10-30 MED ORDER — SODIUM CHLORIDE 0.9 % IV SOLN: INTRAVENOUS | Status: AC

## 2023-10-30 MED ORDER — ACETAMINOPHEN 650 MG RE SUPP: 650.0000 mg | Freq: Four times a day (QID) | RECTAL | Status: DC | PRN

## 2023-10-30 NOTE — ED Provider Notes (Signed)
 [place] EMERGENCY DEPARTMENT AT [facility] Provider Note   CSN: [id number] Arrival date & time: 10/30/23  8182     Patient presents with: No chief complaint on file.   Shaun Kennedy is a 83 y.o. male.   HPI   Endorses episode of chest pain.  Patient states that happened today while sitting down.  Patient states that he was feeling fine and started spearing some substernal chest pain.  No further chest pain or hemoptysis.  No history of DVT or PE.  Patient states that chest pain subsequently subsided.  No exertional chest pain.  Played golf on Friday and was able to do so without it, chest pain or shortness of breath.  Patient states that he denies any kind of cardiac disease history.  Only feeling at baseline.  EMS reports that patient had felt dizzy at 1 point but patient denies this.  Denies feeling dizzy before or after this event.  No vertigo.  No vision changes.  No numbness or tingling anywhere.  He denies any recent orthostatic symptoms.  No fever no chills.  Patient states he is post have his trach changed on Tuesday.  Otherwise, denies all complaints.  States he came to the ED because his wife wanted him to   wife at bedside, patient was playing cards with her.  Subsequently became unresponsive and leaning forward and drooling.  Patient states that he does not remember this event but wife states that he was mostly unresponsive during this event.  Subsequently regained consciousness after about 5 to 10 seconds.  Patient stated that time that he was not feeling well.  Was able to walk around but was staggering slightly.  She is not noticing kind of abnormalities leading up to this.  He has not been have any nausea or vomiting recently.  Previous medical history reviewed : Last admitted back in 2022.  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  Status post trach and March 2022.  According to primary care notes.  Question whether or not he had metastatic  disease process to the bowels.  Completed 22 cycles of chemo.  Last CT scan did not suggest any further disease progression.   Prior to Admission medications   Medication Sig Start Date End Date Taking? Authorizing Provider  Alcohol  Swabs (DROPSAFE ALCOHOL  PREP) 70 % PADS Apply topically. 08/26/20   [provider]  amLODipine  (NORVASC ) 5 MG tablet TAKE 1 TABLET (5 MG TOTAL) BY MOUTH DAILY. 06/14/23   [name], MD  aspirin  EC 81 MG tablet Take 81 mg by mouth daily after breakfast.     [provider]  bisacodyl (DULCOLAX) 5 MG EC tablet Take 10 mg by mouth daily as needed for moderate constipation.    [provider]  Blood Glucose Monitoring Suppl DEVI 1 each by Does not apply route in the morning, at noon, and at bedtime. May substitute to any manufacturer covered by patient's insurance. 03/30/23   Lalonde, John C, MD  Blood Glucose Monitoring Suppl DEVI 1 each by Does not apply route in the morning, at noon, and at bedtime. May substitute to any manufacturer covered by patient's insurance. 10/26/23   [name], MD  cholecalciferol  (VITAMIN D ) 25 MCG (1000 UNIT) tablet Take 1,000 Units by mouth daily.    [provider]  donepezil  (ARICEPT ) 10 MG tablet Take 1 tablet (10 mg total) by mouth at bedtime. 06/14/23   Lalonde, John C, MD  ferrous  sulfate 325 (65 FE) MG EC tablet TAKE 1 TABLET EVERY DAY WITH BREAKFAST 06/14/23   Lalonde, John C, MD  Glucose Blood (BLOOD GLUCOSE TEST STRIPS) STRP 1 each by In Vitro route in the morning, at noon, and at bedtime. May substitute to any manufacturer covered by patient's insurance. 10/26/23 11/25/23  [name], MD  hydrOXYzine  (ATARAX ) 10 MG tablet Take 1 tablet (10 mg total) by mouth 3 (three) times daily as needed. 10/13/21   Sherrod Sherrod, MD  Lancet Device MISC 1 each by Does not apply route in the morning, at noon, and at bedtime. May substitute to any manufacturer covered by patient's insurance. 10/26/23  11/25/23  [name], MD  Lancets Misc. MISC 1 each by Does not apply route in the morning, at noon, and at bedtime. May substitute to any manufacturer covered by patient's insurance. 10/26/23 11/25/23  [name], MD  lidocaine -prilocaine  (EMLA ) cream Apply 1 Application topically as needed. 04/12/23   Sherrod Sherrod, MD  lisinopril -hydrochlorothiazide  (ZESTORETIC ) 20-12.5 MG tablet Take 1 tablet by mouth daily. 06/14/23   [name], MD  memantine  (NAMENDA ) 10 MG tablet TAKE 1 TABLET TWICE DAILY 05/01/23   Lalonde, John C, MD  metFORMIN  (GLUCOPHAGE -XR) 500 MG 24 hr tablet Take 1 tablet (500 mg total) by mouth daily. 06/14/23   Lalonde, John C, MD  Multiple Vitamin (MULTIVITAMIN WITH MINERALS) TABS tablet Take 1 tablet by mouth daily after breakfast.     [provider]  simvastatin  (ZOCOR ) 40 MG tablet Take 1 tablet (40 mg total) by mouth daily. 06/14/23   [name], MD  solifenacin  (VESICARE ) 5 MG tablet Take 1 tablet (5 mg total) by mouth daily. 06/14/23   Lalonde, John C, MD    Allergies: Patient has no known allergies.    Review of Systems  Constitutional:  Negative for chills and fever.  HENT:  Negative for ear pain and sore throat.   Eyes:  Negative for pain and visual disturbance.  Respiratory:  Negative for cough and shortness of breath.   Cardiovascular:  Negative for chest pain and palpitations.  Gastrointestinal:  Negative for abdominal pain and vomiting.  Genitourinary:  Negative for dysuria and hematuria.  Musculoskeletal:  Negative for arthralgias and back pain.  Skin:  Negative for color change and rash.  Neurological:  Negative for seizures and syncope.  All other systems reviewed and are negative.   Updated Vital Signs There were no vitals taken for this visit.  Physical Exam Vitals and nursing note reviewed.  Constitutional:      General: He is not in acute distress.    Appearance: He is well-developed.  HENT:     Head: Normocephalic and  atraumatic.  Eyes:     Conjunctiva/sclera: Conjunctivae normal.  Cardiovascular:     Rate and Rhythm: Normal rate and regular rhythm.     Heart sounds: No murmur heard. Pulmonary:     Effort: Pulmonary effort is normal. No respiratory distress.     Breath sounds: Normal breath sounds.  Abdominal:     Palpations: Abdomen is soft.     Tenderness: There is no abdominal tenderness.  Musculoskeletal:        General: No swelling.     Cervical back: Neck supple.  Skin:    General: Skin is warm and dry.     Capillary Refill: Capillary refill takes less than 2 seconds.  Neurological:     Mental Status: He is alert.  Psychiatric:  Mood and Affect: Mood normal.     (all labs ordered are listed, but only abnormal results are displayed) Labs Reviewed - No data to display  EKG: None  Radiology: No results found.   Procedures   Medications Ordered in the ED - No data to display                                  Medical Decision Making Amount and/or Complexity of Data Reviewed Labs: ordered. Radiology: ordered.      Endorses episode of chest pain.  Patient states that happened today while sitting down.  Patient states that he was feeling fine and started spearing some substernal chest pain.  No further chest pain or hemoptysis.  No history of DVT or PE.  Patient states that chest pain subsequently subsided.  No exertional chest pain.  Played golf on Friday and was able to do so without it, chest pain or shortness of breath.  Patient states that he denies any kind of cardiac disease history.  Only feeling at baseline.  EMS reports that patient had felt dizzy at 1 point but patient denies this.  Denies feeling dizzy before or after this event.  No vertigo.  No vision changes.  No numbness or tingling anywhere.  He denies any recent orthostatic symptoms.  No fever no chills.  Patient states he is post have his trach changed on Tuesday.  Otherwise, denies all complaints.  States he  came to the ED because his wife wanted him to    wife at bedside, patient was playing cards with her.  Subsequently became unresponsive and leaning forward and drooling.  Patient states that he does not remember this event but wife states that he was mostly unresponsive during this event.  Subsequently regained consciousness after about 5 to 10 seconds.  Patient stated that time that he was not feeling well.  Was able to walk around but was staggering slightly.  She is not noticing kind of abnormalities leading up to this.  He has not been have any nausea or vomiting recently.   Previous medical history reviewed : Last admitted back in 2022.  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  Status post trach and March 2022.  According to primary care notes.  Question whether or not he had metastatic disease process to the bowels.  Completed 22 cycles of chemo.  Last CT scan did not suggest any further disease progression.   On exam, patient hemodynamically stable.  O2 saturation 100% on room air when I was in the room.  Patient trach in place.  No rales rhonchi or wheezing I can appreciate.          Final diagnoses:  None    ED Discharge Orders     None          Simon Lavonia SAILOR, MD 10/30/23 [id number]

## 2023-10-30 NOTE — Progress Notes (Signed)
  Carryover admission to the Day Admitter.  I discussed this case with the EDP, Dr. Lavonia Pat.  Per these discussions:   This is a 83 year old male with chronic trach, who is being admitted with a single episode of syncope without prodrome, as well as a single brief self-limited run of 3rd degree heart block noted on tele.   The patient was playing cards earlier this evening, when he experienced a loss of consciousness, without any preceding dizziness, lightheadedness.  He remained unconscious for less than 10 seconds, without a report of tonic-clonic activity or any acute focal neurologic deficits.  No associated any chest pain or shortness of breath.  He subsequently had episode of nausea/vomiting.  No additional episodes of syncope.   While in the ED, telemetry demonstrated a single brief self-limited run of third-degree heart block.  He has subsequently remained in sinus rhythm w/o e/o residual HB, with heart rates in the 70s.   It does not appear that he is on any AV nodal blocking agents as an outpatient.  He was briefly hypotensive in the ED, which is resolved following administration of 2 L of normal saline, considering blood pressures in the 1 teens to 120s mmHg.  EKG is reported showed sinus rhythm without overt evidence of acute ischemic changes.  Initial troponin 8 followed by second value of 12.  CMP notable for potassium 3.4.  Magnesium  level 1.8.  Lactate 1.2.  TSH 2.196.  Chest x-ray showed evidence of infiltrates in the left mid and lower lung fields, concerning for pneumonia, in the absence of any evidence of pulmonary edema or pneumothorax.  Additionally, he underwent CTA chest with PE protocol, which showed no evidence of acute pulmonary embolism.  In the setting of left-sided infiltrates and is patient with a chronic trach, the patient received a dose of Zosyn .  He also received potassium chloride  40 mill equivalents p.o. x 1 dose in the ED.  EDP has discussed with on-call  cardiology fellow who conveys that cardiology will formally consult. Cards fellow recommends interval monitoring on tele, with additional cardiology recs to follow.  I have placed an order for observation to pcu for further evaluation and management of the above.  I have placed some additional preliminary admit orders via the adult multi-morbid admission order set. I have also ordered fall precautions, echocardiogram, repeat troponin in the morning.  Will also order gentle IV fluids in the form of normal saline at 75 cc/h x 8 hours.  Regarding suspected left-sided pneumonia, continued Zosyn , and ordered procalcitonin / BNP, and have ordered morning labs in the form of CMP, CBC, magnesium  level.    Eva Pore, DO Hospitalist

## 2023-10-30 NOTE — Progress Notes (Incomplete)
(  Carryover admission to the Day Admitter for single episode of syncope without prodrome; tele was suspicious for a single brief self-limited run of 3rd degree heart block; EDP has discussed with on-call cardiology fellow who conveys that cardiology will formally consult. Cards fellow recommends interval monitoring on tele, with additional cardiology recs to follow; please see my progress note for additional details).   Carryover admission to the Day Admitter.  I discussed this case with the EDP, Dr. Lavonia Pat.  Per these discussions:   This is a 83 year old male with chronic trach, who is being admitted with a single episode of syncope without prodrome, as well as a single brief self-limited run of 3rd degree heart block noted on tele.   The patient was playing cards earlier this evening, when he experienced a loss of consciousness, without any preceding dizziness, lightheadedness.  He remained unconscious for less than 10 seconds, without a report of tonic-clonic activity or any acute focal neurologic deficits.  No associated any chest pain or shortness of breath.  He subsequently had episode of nausea/vomiting.  No additional episodes of syncope.   While in the ED, telemetry demonstrated a single brief self-limited run of third-degree heart block.  He has subsequently remained in sinus rhythm, with heart rates in the 70s.   He was briefly hypotensive in the ED, which is resolved following administration of 2 L of normal saline, considering blood pressures in the 1 teens to 120s.   EDP has discussed with on-call cardiology fellow who conveys that cardiology will formally consult. Cards fellow recommends interval monitoring on tele, with additional cardiology recs to follow  I have placed an order for  ***  I have placed some additional preliminary admit orders via the adult multi-morbid admission order set. I have also ordered ***    Eva Pore, DO Hospitalist

## 2023-10-30 NOTE — ED Provider Notes (Incomplete)
 [place] EMERGENCY DEPARTMENT AT [facility] Provider Note   CSN: [id number] Arrival date & time: 10/30/23  8182     Patient presents with: Chest Pain and Dizziness   Shaun Kennedy is a 83 y.o. male.  {Add pertinent medical, surgical, social history, OB history to HPI:32947} HPI   Endorses episode of chest pain.  Patient states that happened today while sitting down.  Patient states that he was feeling fine and started spearing some substernal chest pain.  No further chest pain or hemoptysis.  No history of DVT or PE.  Patient states that chest pain subsequently subsided.  No exertional chest pain.  Played golf on Friday and was able to do so without it, chest pain or shortness of breath.  Patient states that he denies any kind of cardiac disease history.  Only feeling at baseline.  EMS reports that patient had felt dizzy at 1 point but patient denies this.  Denies feeling dizzy before or after this event.  No vertigo.  No vision changes.  No numbness or tingling anywhere.  He denies any recent orthostatic symptoms.  No fever no chills.  Patient states he is post have his trach changed on Tuesday.  Otherwise, denies all complaints.  States he came to the ED because his wife wanted him to   wife at bedside, patient was playing cards with her.  Subsequently became unresponsive and leaning forward and drooling.  Patient states that he does not remember this event but wife states that he was mostly unresponsive during this event.  Subsequently regained consciousness after about 5 to 10 seconds.  Patient stated that time that he was not feeling well.  Was able to walk around but was staggering slightly.  She is not noticing kind of abnormalities leading up to this.  He has not been have any nausea or vomiting recently.  Previous medical history reviewed : Last admitted back in 2022.  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  Status post trach and March 2022.  According  to primary care notes.  Question whether or not he had metastatic disease process to the bowels.  Completed 22 cycles of chemo.  Last CT scan did not suggest any further disease progression.   Prior to Admission medications   Medication Sig Start Date End Date Taking? Authorizing Provider  Alcohol  Swabs (DROPSAFE ALCOHOL  PREP) 70 % PADS Apply topically. 08/26/20   [provider]  amLODipine  (NORVASC ) 5 MG tablet TAKE 1 TABLET (5 MG TOTAL) BY MOUTH DAILY. 06/14/23   [name], MD  aspirin  EC 81 MG tablet Take 81 mg by mouth daily after breakfast.     [provider]  bisacodyl (DULCOLAX) 5 MG EC tablet Take 10 mg by mouth daily as needed for moderate constipation.    [provider]  Blood Glucose Monitoring Suppl DEVI 1 each by Does not apply route in the morning, at noon, and at bedtime. May substitute to any manufacturer covered by patient's insurance. 03/30/23   Lalonde, John C, MD  Blood Glucose Monitoring Suppl DEVI 1 each by Does not apply route in the morning, at noon, and at bedtime. May substitute to any manufacturer covered by patient's insurance. 10/26/23   [name], MD  cholecalciferol  (VITAMIN D ) 25 MCG (1000 UNIT) tablet Take 1,000 Units by mouth daily.    [provider]  donepezil  (ARICEPT ) 10 MG tablet Take 1 tablet (10 mg total) by mouth at bedtime. 06/14/23  [name], MD  ferrous sulfate  325 (65 FE) MG EC tablet TAKE 1 TABLET EVERY DAY WITH BREAKFAST 06/14/23   Lalonde, John C, MD  Glucose Blood (BLOOD GLUCOSE TEST STRIPS) STRP 1 each by In Vitro route in the morning, at noon, and at bedtime. May substitute to any manufacturer covered by patient's insurance. 10/26/23 11/25/23  [name], MD  hydrOXYzine  (ATARAX ) 10 MG tablet Take 1 tablet (10 mg total) by mouth 3 (three) times daily as needed. 10/13/21   Sherrod Sherrod, MD  Lancet Device MISC 1 each by Does not apply route in the morning, at noon, and at bedtime. May substitute  to any manufacturer covered by patient's insurance. 10/26/23 11/25/23  [name], MD  Lancets Misc. MISC 1 each by Does not apply route in the morning, at noon, and at bedtime. May substitute to any manufacturer covered by patient's insurance. 10/26/23 11/25/23  [name], MD  lidocaine -prilocaine  (EMLA ) cream Apply 1 Application topically as needed. 04/12/23   Sherrod Sherrod, MD  lisinopril -hydrochlorothiazide  (ZESTORETIC ) 20-12.5 MG tablet Take 1 tablet by mouth daily. 06/14/23   [name], MD  memantine  (NAMENDA ) 10 MG tablet TAKE 1 TABLET TWICE DAILY 05/01/23   [name], MD  metFORMIN  (GLUCOPHAGE -XR) 500 MG 24 hr tablet Take 1 tablet (500 mg total) by mouth daily. 06/14/23   Lalonde, John C, MD  Multiple Vitamin (MULTIVITAMIN WITH MINERALS) TABS tablet Take 1 tablet by mouth daily after breakfast.     [provider]  simvastatin  (ZOCOR ) 40 MG tablet Take 1 tablet (40 mg total) by mouth daily. 06/14/23   [name], MD  solifenacin  (VESICARE ) 5 MG tablet Take 1 tablet (5 mg total) by mouth daily. 06/14/23   Lalonde, John C, MD    Allergies: Patient has no known allergies.    Review of Systems  Constitutional:  Negative for chills and fever.  HENT:  Negative for ear pain and sore throat.   Eyes:  Negative for pain and visual disturbance.  Respiratory:  Negative for cough and shortness of breath.   Cardiovascular:  Negative for chest pain and palpitations.  Gastrointestinal:  Negative for abdominal pain and vomiting.  Genitourinary:  Negative for dysuria and hematuria.  Musculoskeletal:  Negative for arthralgias and back pain.  Skin:  Negative for color change and rash.  Neurological:  Negative for seizures and syncope.  All other systems reviewed and are negative.   Updated Vital Signs BP 114/63   Pulse 74   Temp 97.8 F (36.6 C) (Oral)   Resp 11   SpO2 100%   Physical Exam Vitals and nursing note reviewed.  Constitutional:      General: He is not  in acute distress.    Appearance: He is well-developed.  HENT:     Head: Normocephalic and atraumatic.  Eyes:     Conjunctiva/sclera: Conjunctivae normal.  Cardiovascular:     Rate and Rhythm: Normal rate and regular rhythm.     Heart sounds: No murmur heard. Pulmonary:     Effort: Pulmonary effort is normal. No respiratory distress.     Breath sounds: Normal breath sounds.  Abdominal:     Palpations: Abdomen is soft.     Tenderness: There is no abdominal tenderness.  Musculoskeletal:        General: No swelling.     Cervical back: Neck supple.  Skin:    General: Skin is warm and dry.     Capillary Refill: Capillary  refill takes less than 2 seconds.  Neurological:     Mental Status: He is alert.  Psychiatric:        Mood and Affect: Mood normal.     (all labs ordered are listed, but only abnormal results are displayed) Labs Reviewed  CBC - Abnormal; Notable for the following components:      Result Value   WBC 12.0 (*)    RBC 3.90 (*)    Hemoglobin 12.1 (*)    HCT 37.5 (*)    All other components within normal limits  COMPREHENSIVE METABOLIC PANEL WITH GFR - Abnormal; Notable for the following components:   CO2 20 (*)    Creatinine, Ser 1.71 (*)    Calcium 8.8 (*)    Albumin 3.1 (*)    GFR, Estimated 39 (*)    All other components within normal limits  D-DIMER, QUANTITATIVE - Abnormal; Notable for the following components:   D-Dimer, Quant >20.00 (*)    All other components within normal limits  I-STAT CHEM 8, ED - Abnormal; Notable for the following components:   BUN 25 (*)    Creatinine, Ser 1.80 (*)    Glucose, Bld 103 (*)    Calcium, Ion 1.14 (*)    TCO2 21 (*)    Hemoglobin 12.6 (*)    HCT 37.0 (*)    All other components within normal limits  CULTURE, BLOOD (ROUTINE X 2)  CULTURE, BLOOD (ROUTINE X 2)  LIPASE, BLOOD  MAGNESIUM   TSH  I-STAT CG4 LACTIC ACID, ED  I-STAT CG4 LACTIC ACID, ED  TROPONIN I (HIGH SENSITIVITY)  TROPONIN I (HIGH SENSITIVITY)     EKG: None  Radiology: CT Angio Chest PE W and/or Wo Contrast Result Date: 10/30/2023 EXAM: CTA CHEST PE WITHOUT AND WITH CONTRAST CT ABDOMEN AND PELVIS WITHOUT AND WITH CONTRAST 10/30/2023 10:15:48 PM TECHNIQUE: CTA of the chest was performed after the administration of intravenous contrast. Multiplanar reformatted images are provided for review. MIP images are provided for review. CT of the abdomen and pelvis was performed with the administration of intravenous contrast. Automated exposure control, iterative reconstruction, and/or weight based adjustment of the mA/kV was utilized to reduce the radiation dose to as low as reasonably achievable. COMPARISON: CT chest abdomen and pelvis dated 06/22/2023. CLINICAL HISTORY: Rule out PE. Chest pain, dizziness, sepsis workup. N/V. eval for evidence of obstruction/ileus. FINDINGS: CHEST: PULMONARY ARTERIES: Pulmonary arteries are adequately opacified for evaluation. No intraluminal filling defect to suggest pulmonary embolism. Main pulmonary artery is normal in caliber. MEDIASTINUM: No mediastinal lymphadenopathy. The heart and pericardium demonstrate no acute abnormality. There is no acute abnormality of the thoracic aorta. Thoracic aortic atherosclerosis. LUNGS AND PLEURA: 6 mm subpleural left lower lobe nodule (image 67), previously 7 mm, likely benign. Additional peribronchovascular nodularity at the posterior left lung base (image 94), chronic. Radiation changes in the medial left upper lobe/perihilar region. No focal consolidation or pulmonary edema. No pleural effusion or pneumothorax. SOFT TISSUES AND BONES: No acute bone or soft tissue abnormality. Right chest port terminates at the cavoatrial junction. ABDOMEN AND PELVIS: LIVER: Unremarkable. GALLBLADDER AND BILE DUCTS: Gallbladder is unremarkable. No biliary ductal dilatation. SPLEEN: Spleen demonstrates no acute abnormality. PANCREAS: Pancreas demonstrates no acute abnormality. ADRENAL GLANDS: Adrenal  glands demonstrate no acute abnormality. KIDNEYS, URETERS AND BLADDER: No stones in the kidneys or ureters. No hydronephrosis. No perinephric or periureteral stranding. Urinary bladder is unremarkable. GI AND BOWEL: Mildly thick-walled loops of small bowel in the left mid abdomen with mild  mesenteric stranding (image 44), raising concern for infectious/inflammatory enteritis. No pneumatosis or free air. Sigmoid diverticulosis, without evidence of diverticulitis. Normal appendix (image 48). REPRODUCTIVE: Brachytherapy seeds along the prostate. PERITONEUM AND RETROPERITONEUM: Small volume abdominopelvic ascites, predominantly perisplenic. LYMPH NODES: No lymphadenopathy. BONES AND SOFT TISSUES: No acute abnormality of the visualized bones. Tiny fat containing left inguinal hernia. Atherosclerotic calcifications of the abdominal aorta and branch vessels, although patent. IMPRESSION: 1. No evidence of pulmonary embolism. 2. Mild small bowel wall thickening with mesenteric stranding in the left mid abdomen, suggesting infectious/inflammatory enteritis. No pneumatosis or free air. 3. Radiation changes in the left hemithorax. No findings suspicious for recurrent or metastatic disease. 4. Additional ancillary findings as above. Electronically signed by: Pinkie Pebbles MD 10/30/2023 10:32 PM EDT RP Workstation: [id number]   CT ABDOMEN PELVIS W CONTRAST Result Date: 10/30/2023 EXAM: CTA CHEST PE WITHOUT AND WITH CONTRAST CT ABDOMEN AND PELVIS WITHOUT AND WITH CONTRAST 10/30/2023 10:15:48 PM TECHNIQUE: CTA of the chest was performed after the administration of intravenous contrast. Multiplanar reformatted images are provided for review. MIP images are provided for review. CT of the abdomen and pelvis was performed with the administration of intravenous contrast. Automated exposure control, iterative reconstruction, and/or weight based adjustment of the mA/kV was utilized to reduce the radiation dose to as low as  reasonably achievable. COMPARISON: CT chest abdomen and pelvis dated 06/22/2023. CLINICAL HISTORY: Rule out PE. Chest pain, dizziness, sepsis workup. N/V. eval for evidence of obstruction/ileus. FINDINGS: CHEST: PULMONARY ARTERIES: Pulmonary arteries are adequately opacified for evaluation. No intraluminal filling defect to suggest pulmonary embolism. Main pulmonary artery is normal in caliber. MEDIASTINUM: No mediastinal lymphadenopathy. The heart and pericardium demonstrate no acute abnormality. There is no acute abnormality of the thoracic aorta. Thoracic aortic atherosclerosis. LUNGS AND PLEURA: 6 mm subpleural left lower lobe nodule (image 67), previously 7 mm, likely benign. Additional peribronchovascular nodularity at the posterior left lung base (image 94), chronic. Radiation changes in the medial left upper lobe/perihilar region. No focal consolidation or pulmonary edema. No pleural effusion or pneumothorax. SOFT TISSUES AND BONES: No acute bone or soft tissue abnormality. Right chest port terminates at the cavoatrial junction. ABDOMEN AND PELVIS: LIVER: Unremarkable. GALLBLADDER AND BILE DUCTS: Gallbladder is unremarkable. No biliary ductal dilatation. SPLEEN: Spleen demonstrates no acute abnormality. PANCREAS: Pancreas demonstrates no acute abnormality. ADRENAL GLANDS: Adrenal glands demonstrate no acute abnormality. KIDNEYS, URETERS AND BLADDER: No stones in the kidneys or ureters. No hydronephrosis. No perinephric or periureteral stranding. Urinary bladder is unremarkable. GI AND BOWEL: Mildly thick-walled loops of small bowel in the left mid abdomen with mild mesenteric stranding (image 44), raising concern for infectious/inflammatory enteritis. No pneumatosis or free air. Sigmoid diverticulosis, without evidence of diverticulitis. Normal appendix (image 48). REPRODUCTIVE: Brachytherapy seeds along the prostate. PERITONEUM AND RETROPERITONEUM: Small volume abdominopelvic ascites, predominantly  perisplenic. LYMPH NODES: No lymphadenopathy. BONES AND SOFT TISSUES: No acute abnormality of the visualized bones. Tiny fat containing left inguinal hernia. Atherosclerotic calcifications of the abdominal aorta and branch vessels, although patent. IMPRESSION: 1. No evidence of pulmonary embolism. 2. Mild small bowel wall thickening with mesenteric stranding in the left mid abdomen, suggesting infectious/inflammatory enteritis. No pneumatosis or free air. 3. Radiation changes in the left hemithorax. No findings suspicious for recurrent or metastatic disease. 4. Additional ancillary findings as above. Electronically signed by: Pinkie Pebbles MD 10/30/2023 10:32 PM EDT RP Workstation: [id number]   DG Chest Port 1 View Result Date: 10/30/2023 CLINICAL DATA:  Chest pain and dizziness. Central chest pain  radiating to both sides. EXAM: PORTABLE CHEST 1 VIEW COMPARISON:  CT chest 06/22/2023 FINDINGS: Tracheostomy tube with tip measuring 4.7 cm above the carina. Power port type central venous catheter with tip over the cavoatrial junction region. No pneumothorax. Shallow inspiration. Mild cardiac enlargement. No vascular congestion or edema. Mild left perihilar and basilar infiltration may represent pneumonia or atelectasis. Probable small left pleural effusion. Calcification of the aorta. Degenerative changes in the spine and shoulders. IMPRESSION: Probable small left pleural effusion with infiltration or atelectasis in the left mid and lower lung zone, likely pneumonia. Electronically Signed   By: Elsie Gravely M.D.   On: 10/30/2023 19:17    {Document cardiac monitor, telemetry assessment procedure when appropriate:32947} Procedures   Medications Ordered in the ED  piperacillin -tazobactam (ZOSYN ) IVPB 3.375 g (3.375 g Intravenous New Bag/Given 10/30/23 2333)  sodium chloride  0.9 % bolus 1,000 mL (1,000 mLs Intravenous New Bag/Given 10/30/23 1951)  sodium chloride  0.9 % bolus 1,000 mL (1,000 mLs Intravenous  New Bag/Given 10/30/23 2019)  iohexol  (OMNIPAQUE ) 350 MG/ML injection 75 mL (75 mLs Intravenous Contrast Given 10/30/23 2216)      {Click here for ABCD2, HEART and other calculators REFRESH Note before signing:1}                              Medical Decision Making Amount and/or Complexity of Data Reviewed Labs: ordered. Radiology: ordered.  Risk Prescription drug management.      Endorses episode of chest pain.  Patient states that happened today while sitting down.  Patient states that he was feeling fine and started spearing some substernal chest pain.  No further chest pain or hemoptysis.  No history of DVT or PE.  Patient states that chest pain subsequently subsided.  No exertional chest pain.  Played golf on Friday and was able to do so without it, chest pain or shortness of breath.  Patient states that he denies any kind of cardiac disease history.  Only feeling at baseline.  EMS reports that patient had felt dizzy at 1 point but patient denies this.  Denies feeling dizzy before or after this event.  No vertigo.  No vision changes.  No numbness or tingling anywhere.  He denies any recent orthostatic symptoms.  No fever no chills.  Patient states he is post have his trach changed on Tuesday.  Otherwise, denies all complaints.  States he came to the ED because his wife wanted him to    wife at bedside, patient was playing cards with her.  Subsequently became unresponsive and leaning forward and drooling.  Patient states that he does not remember this event but wife states that he was mostly unresponsive during this event.  Subsequently regained consciousness after about 5 to 10 seconds.  Patient stated that time that he was not feeling well.  Was able to walk around but was staggering slightly.  She is not noticing kind of abnormalities leading up to this.  He has not been have any nausea or vomiting recently.   Previous medical history reviewed : Last admitted back in 2022.  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  Status post trach and March 2022.  According to primary care notes.  Question whether or not he had metastatic disease process to the bowels.  Completed 22 cycles of chemo.  Last CT scan did not suggest any further disease progression.   On exam, patient hemodynamically stable.  O2  saturation 100% on room air when I was in the room.  Patient trach in place.  No rales rhonchi or wheezing I can appreciate.  No focal deficits.  Cranials 2 through 12 intact.  No concerns for CVA.   Patient did have episode where he became bradycardic and subsequently hypotensive.  Had an episode of emesis at this point time.  I reviewed the cardiac telemetry during this time, looks like he had what appears to be third-degree heart block.  Question whether or not this could have happened at home as well.  I did speak to cardiology about the patient.  Remains in sinus rhythm since then with occasional sinus pauses.  Recommended cardiac telemetry overnight.  No immediate intervention needed.  Obtain D-dimer given chest pain.  Elevated.  Also obtain chest x-ray given chest pain.  Given chest x-ray that showed possible pneumonia in the setting of aspiration, did start patient on Zosyn .  No recent hospital admission where needed to feel like I need to cover for MRSA.  Given the elevated D-dimer, did obtain CTA of the chest.  CT of the chest did not see any obvious infiltrate.  The abdomen did show some hospital findings of infectious/inflammatory enteritis.  This explain patient's vomiting.   Patient was initially hypotensive.  Did receive 2 L of fluid.  Maps improved.  Negative lactate acidosis  EKG initially sinus rhythm.  Troponin 8 with repeat of 12.  If continues to have chest pain can have further serial troponins.    Patient will be admitted for further care.  {Document critical care time when appropriate  Document review of labs and clinical decision tools ie  CHADS2VASC2, etc  Document your independent review of radiology images and any outside records  Document your discussion with family members, caretakers and with consultants  Document social determinants of health affecting pt's care  Document your decision making why or why not admission, treatments were needed:32947:::1}   Final diagnoses:  None    ED Discharge Orders     None          Simon Lavonia SAILOR, MD 10/30/23 2334

## 2023-10-30 NOTE — Consult Note (Incomplete)
 Cardiology Consultation   Patient ID: Shaun Kennedy MRN: [id number]; DOB: [birth year]  Admit date: 10/30/2023 Date of Consult: 10/30/2023  PCP:  Shaun Norleen BROCKS, MD   [facility] Providers Cardiologist:  None   { Click here to update MD or APP on Care Team, Refresh:1}     Patient Profile: Shaun Kennedy is a 83 y.o. male with a hx of *** who is being seen 10/30/2023 for the evaluation of *** at the request of ***.  History of Present Illness: Shaun Kennedy reports that he was sitting at home and talking to his wife when he started to feel lightheaded and felt like he's about to pass out. No syncope. He report that this feeling lasted for a few minutes and then resolved spontaneously. He reports no chest pain, SOB, or palpitations. No headache, vision changes, weakness and sensation changes anywhere. He reports no prior similar symptoms. No recurrence since his presentation in the ED.   While in the ED, telemetry showed ~1 minute of complete heart block. He remained hemodynamically stable during that episode and does not recall any symptoms while being in the ED.   EKG showed   Past Medical History:  Diagnosis Date  . Allergic rhinitis   . Asthma   . Carotid stenosis   . Colon cancer (HCC) 2003  . Diabetes mellitus (HCC)   . Diverticulosis   . Dyslipidemia   . ED (erectile dysfunction)   . GERD (gastroesophageal reflux disease)   . H/O degenerative disc disease   . Hemorrhoids   . HTN (hypertension)    as a child  . Hyperlipidemia   . Lung cancer (HCC)   . LVH (left ventricular hypertrophy)    on EKG  . Mass of lower lobe of left lung   . Mediastinal adenopathy   . Smoker    former  . Wears dentures   . Wears dentures   . Wears glasses   . Wears glasses     Past Surgical History:  Procedure Laterality Date  . BRONCHIAL BIOPSY  08/20/2019   Procedure: BRONCHIAL BIOPSIES;  Surgeon: Shelah Lamar RAMAN, MD;  Location: [facility] ENDOSCOPY;  Service: Pulmonary;;  .  BRONCHIAL BRUSHINGS  08/20/2019   Procedure: BRONCHIAL BRUSHINGS;  Surgeon: Shelah Lamar RAMAN, MD;  Location: [facility] ENDOSCOPY;  Service: Pulmonary;;  . BRONCHIAL NEEDLE ASPIRATION BIOPSY  08/20/2019   Procedure: BRONCHIAL NEEDLE ASPIRATION BIOPSIES;  Surgeon: Shelah Lamar RAMAN, MD;  Location: [facility] ENDOSCOPY;  Service: Pulmonary;;  . CARPAL TUNNEL RELEASE Right 01/09/2019   Procedure: CARPAL TUNNEL RELEASE;  Surgeon: Jerri Kay HERO, MD;  Location: [facility];  Service: Orthopedics;  Laterality: Right;  . CARPAL TUNNEL WITH CUBITAL TUNNEL Right 11/18/2020   Procedure: CARPAL TUNNEL WITH CUBITAL TUNNEL;  Surgeon: Sissy Cough, MD;  Location: [facility] OR;  Service: Orthopedics;  Laterality: Right;  . CATARACT EXTRACTION Right 2018  . COLONOSCOPY  2007   Dr. Rollin  . DIRECT LARYNGOSCOPY N/A 04/10/2020   Procedure: DIRECT LARYNGOSCOPY;  Surgeon: Carlie Clark, MD;  Location: [facility] OR;  Service: ENT;  Laterality: N/A;  . I & D EXTREMITY Right 04/02/2016   Procedure: IRRIGATION AND DEBRIDEMENT GREAT TOE;  Surgeon: Kay HERO Jerri, MD;  Location: [facility] OR;  Service: Orthopedics;  Laterality: Right;  . IR IMAGING GUIDED PORT INSERTION  08/30/2019  . MULTIPLE TOOTH EXTRACTIONS    . RADIOACTIVE SEED IMPLANT  2003  . TRACHEOSTOMY TUBE PLACEMENT N/A 04/10/2020   Procedure: TRACHEOSTOMY;  Surgeon: Carlie,  Vaughan, MD;  Location: [facility] OR;  Service: ENT;  Laterality: N/A;  . ULNAR TUNNEL RELEASE Right 01/09/2019   Procedure: RIGHT CUBITAL TUNNEL RELEASE AND CARPAL TUNNEL RELEASE;  Surgeon: Jerri Kay HERO, MD;  Location: [facility];  Service: Orthopedics;  Laterality: Right;  . UPPER GASTROINTESTINAL ENDOSCOPY    . VIDEO BRONCHOSCOPY N/A 12/18/2019   Procedure: VIDEO BRONCHOSCOPY WITHOUT FLUORO;  Surgeon: Shelah Lamar RAMAN, MD;  Location: [facility]COPY;  Service: Cardiopulmonary;  Laterality: N/A;  . VIDEO BRONCHOSCOPY WITH ENDOBRONCHIAL ULTRASOUND N/A 08/20/2019   Procedure: VIDEO BRONCHOSCOPY WITH ENDOBRONCHIAL  ULTRASOUND;  Surgeon: Shelah Lamar RAMAN, MD;  Location: [facility] ENDOSCOPY;  Service: Pulmonary;  Laterality: N/A;     Home Medications:  Prior to Admission medications   Medication Sig Start Date End Date Taking? Authorizing Provider  Alcohol  Swabs (DROPSAFE ALCOHOL  PREP) 70 % PADS Apply topically. 08/26/20   [provider]  amLODipine  (NORVASC ) 5 MG tablet TAKE 1 TABLET (5 MG TOTAL) BY MOUTH DAILY. 06/14/23   Shaun Norleen BROCKS, MD  aspirin  EC 81 MG tablet Take 81 mg by mouth daily after breakfast.     [provider]  bisacodyl (DULCOLAX) 5 MG EC tablet Take 10 mg by mouth daily as needed for moderate constipation.    [provider]  Blood Glucose Monitoring Suppl DEVI 1 each by Does not apply route in the morning, at noon, and at bedtime. May substitute to any manufacturer covered by patient's insurance. 03/30/23   Lalonde, John C, MD  Blood Glucose Monitoring Suppl DEVI 1 each by Does not apply route in the morning, at noon, and at bedtime. May substitute to any manufacturer covered by patient's insurance. 10/26/23   Shaun Norleen BROCKS, MD  cholecalciferol  (VITAMIN D ) 25 MCG (1000 UNIT) tablet Take 1,000 Units by mouth daily.    [provider]  donepezil  (ARICEPT ) 10 MG tablet Take 1 tablet (10 mg total) by mouth at bedtime. 06/14/23   Shaun Norleen BROCKS, MD  ferrous sulfate  325 (65 FE) MG EC tablet TAKE 1 TABLET EVERY DAY WITH BREAKFAST 06/14/23   Lalonde, John C, MD  Glucose Blood (BLOOD GLUCOSE TEST STRIPS) STRP 1 each by In Vitro route in the morning, at noon, and at bedtime. May substitute to any manufacturer covered by patient's insurance. 10/26/23 11/25/23  Shaun Norleen BROCKS, MD  hydrOXYzine  (ATARAX ) 10 MG tablet Take 1 tablet (10 mg total) by mouth 3 (three) times daily as needed. 10/13/21   Sherrod Sherrod, MD  Lancet Device MISC 1 each by Does not apply route in the morning, at noon, and at bedtime. May substitute to any manufacturer covered by patient's insurance. 10/26/23  11/25/23  Shaun Norleen BROCKS, MD  Lancets Misc. MISC 1 each by Does not apply route in the morning, at noon, and at bedtime. May substitute to any manufacturer covered by patient's insurance. 10/26/23 11/25/23  Shaun Norleen BROCKS, MD  lidocaine -prilocaine  (EMLA ) cream Apply 1 Application topically as needed. 04/12/23   Sherrod Sherrod, MD  lisinopril -hydrochlorothiazide  (ZESTORETIC ) 20-12.5 MG tablet Take 1 tablet by mouth daily. 06/14/23   Shaun Norleen BROCKS, MD  memantine  (NAMENDA ) 10 MG tablet TAKE 1 TABLET TWICE DAILY 05/01/23   Lalonde, John C, MD  metFORMIN  (GLUCOPHAGE -XR) 500 MG 24 hr tablet Take 1 tablet (500 mg total) by mouth daily. 06/14/23   Lalonde, John C, MD  Multiple Vitamin (MULTIVITAMIN WITH MINERALS) TABS tablet Take 1 tablet by mouth daily after breakfast.     [provider]  simvastatin  (ZOCOR ) 40 MG tablet Take 1 tablet (40 mg total) by mouth daily. 06/14/23   Shaun Norleen BROCKS, MD  solifenacin  (VESICARE ) 5 MG tablet Take 1 tablet (5 mg total) by mouth daily. 06/14/23   Shaun Norleen BROCKS, MD    Scheduled Meds:  Continuous Infusions: . [START ON 10/31/2023] sodium chloride     . piperacillin -tazobactam 3.375 g (10/30/23 2333)   PRN Meds: acetaminophen  **OR** acetaminophen , melatonin, ondansetron  (ZOFRAN ) IV  Allergies:   No Known Allergies  Social History:   Social History   Socioeconomic History  . Marital status: Married    Spouse name: Not on file  . Number of children: 3  . Years of education: Not on file  . Highest education level: Not on file  Occupational History  . Occupation: retired  Tobacco Use  . Smoking status: Former    Current packs/day: 0.00    Average packs/day: 1 pack/day for 32.0 years (32.0 ttl pk-yrs)    Types: Cigarettes    Start date: 02/08/1955    Quit date: 10/08/1985    Years since quitting: 38.0  . Smokeless tobacco: Never  Vaping Use  . Vaping status: Never Used  Substance and Sexual Activity  . Alcohol  use: No  . Drug use: No  . Sexual  activity: Yes  Other Topics Concern  . Not on file  Social History Narrative   Married, no pets   Social Drivers of Health   Financial Resource Strain: Low Risk  (04/25/2023)   Overall Financial Resource Strain (CARDIA)   . Difficulty of Paying Living Expenses: Not hard at all  Food Insecurity: No Food Insecurity (10/26/2023)   Hunger Vital Sign   . Worried About Programme researcher, broadcasting/film/video in the Last Year: Never true   . Ran Out of Food in the Last Year: Never true  Transportation Needs: No Transportation Needs (10/26/2023)   PRAPARE - Transportation   . Lack of Transportation (Medical): No   . Lack of Transportation (Non-Medical): No  Physical Activity: Sufficiently Active (04/25/2023)   Exercise Vital Sign   . Days of Exercise per Week: 3 days   . Minutes of Exercise per Session: 90 min  Stress: No Stress Concern Present (04/25/2023)   [employer] of Occupational Health - Occupational Stress Questionnaire   . Feeling of Stress : Not at all  Social Connections: Moderately Integrated (04/25/2023)   Social Connection and Isolation Panel   . Frequency of Communication with Friends and Family: More than three times a week   . Frequency of Social Gatherings with Friends and Family: More than three times a week   . Attends Religious Services: More than 4 times per year   . Active Member of Clubs or Organizations: No   . Attends Banker Meetings: Never   . Marital Status: Married  Catering manager Violence: Not At Risk (10/26/2023)   Humiliation, Afraid, Rape, and Kick questionnaire   . Fear of Current or Ex-Partner: No   . Emotionally Abused: No   . Physically Abused: No   . Sexually Abused: No    Family History:    Family History  Problem Relation Age of Onset  . Heart failure Mother   . Diabetes Mother   . Stroke Father   . Diabetes Father   . Colon cancer Sister   . Diabetes Sister   . Diabetes Sister   . Lung cancer Sister 14  . Esophageal cancer Neg Hx   .  Rectal cancer Neg  Hx   . Colon polyps Neg Hx   . Stomach cancer Neg Hx      ROS:  Please see the history of present illness.   All other ROS reviewed and negative.     Physical Exam/Data: Vitals:   10/30/23 2230 10/30/23 2245 10/30/23 2315 10/30/23 2348  BP: 116/63  114/63   Pulse: 76  74 76  Resp: 13  11 16   Temp:  97.8 F (36.6 C)    TempSrc:  Oral    SpO2: 100%  100% 100%   No intake or output data in the 24 hours ending 10/30/23 2359    08/23/2023   11:23 AM 06/27/2023    9:31 AM 06/14/2023   10:45 AM  Last 3 Weights  Weight (lbs) 187 lb 12.8 oz 188 lb 11.2 oz 193 lb  Weight (kg) 85.186 kg 85.594 kg 87.544 kg     There is no height or weight on file to calculate BMI.  General:  Well nourished, well developed, in no acute distress HEENT: normal Neck: no JVD Vascular: Distal pulses 2+ bilaterally Cardiac:  normal S1, S2; RRR; no murmur  Lungs:  clear to auscultation bilaterally, no wheezing, rhonchi or rales  Abd: soft, nontender, no hepatomegaly  Ext: no edema Musculoskeletal:  No deformities, BUE and BLE strength normal and equal Skin: warm and dry  Neuro:  CNs 2-12 intact, no focal abnormalities noted Psych:  Normal affect   EKG:  The EKG was personally reviewed and demonstrates:  *** Telemetry:  Telemetry was personally reviewed and demonstrates:  ***  Relevant CV Studies: ***  Laboratory Data: High Sensitivity Troponin:   Recent Labs  Lab 10/30/23 1829 10/30/23 2120  TROPONINIHS 8 12     Chemistry Recent Labs  Lab 10/30/23 1829 10/30/23 1926 10/30/23 1951  NA 137  --  140  K 3.6  --  3.7  CL 104  --  107  CO2 20*  --   --   GLUCOSE 99  --  103*  BUN 23  --  25*  CREATININE 1.71*  --  1.80*  CALCIUM 8.8*  --   --   MG  --  1.8  --   GFRNONAA 39*  --   --   ANIONGAP 13  --   --     Recent Labs  Lab 10/30/23 1829  PROT 7.4  ALBUMIN 3.1*  AST 20  ALT 13  ALKPHOS 45  BILITOT 1.2   Lipids No results for input(s): CHOL, TRIG,  HDL, LABVLDL, LDLCALC, CHOLHDL in the last 168 hours.  Hematology Recent Labs  Lab 10/30/23 1829 10/30/23 1951  WBC 12.0*  --   RBC 3.90*  --   HGB 12.1* 12.6*  HCT 37.5* 37.0*  MCV 96.2  --   MCH 31.0  --   MCHC 32.3  --   RDW 12.4  --   PLT 335  --    Thyroid   Recent Labs  Lab 10/30/23 1939  TSH 2.196    BNPNo results for input(s): BNP, PROBNP in the last 168 hours.  DDimer  Recent Labs  Lab 10/30/23 1829  DDIMER >20.00*    Radiology/Studies:  CT Angio Chest PE W and/or Wo Contrast Result Date: 10/30/2023 EXAM: CTA CHEST PE WITHOUT AND WITH CONTRAST CT ABDOMEN AND PELVIS WITHOUT AND WITH CONTRAST 10/30/2023 10:15:48 PM TECHNIQUE: CTA of the chest was performed after the administration of intravenous contrast. Multiplanar reformatted images are provided for review. MIP images are  provided for review. CT of the abdomen and pelvis was performed with the administration of intravenous contrast. Automated exposure control, iterative reconstruction, and/or weight based adjustment of the mA/kV was utilized to reduce the radiation dose to as low as reasonably achievable. COMPARISON: CT chest abdomen and pelvis dated 06/22/2023. CLINICAL HISTORY: Rule out PE. Chest pain, dizziness, sepsis workup. N/V. eval for evidence of obstruction/ileus. FINDINGS: CHEST: PULMONARY ARTERIES: Pulmonary arteries are adequately opacified for evaluation. No intraluminal filling defect to suggest pulmonary embolism. Main pulmonary artery is normal in caliber. MEDIASTINUM: No mediastinal lymphadenopathy. The heart and pericardium demonstrate no acute abnormality. There is no acute abnormality of the thoracic aorta. Thoracic aortic atherosclerosis. LUNGS AND PLEURA: 6 mm subpleural left lower lobe nodule (image 67), previously 7 mm, likely benign. Additional peribronchovascular nodularity at the posterior left lung base (image 94), chronic. Radiation changes in the medial left upper lobe/perihilar  region. No focal consolidation or pulmonary edema. No pleural effusion or pneumothorax. SOFT TISSUES AND BONES: No acute bone or soft tissue abnormality. Right chest port terminates at the cavoatrial junction. ABDOMEN AND PELVIS: LIVER: Unremarkable. GALLBLADDER AND BILE DUCTS: Gallbladder is unremarkable. No biliary ductal dilatation. SPLEEN: Spleen demonstrates no acute abnormality. PANCREAS: Pancreas demonstrates no acute abnormality. ADRENAL GLANDS: Adrenal glands demonstrate no acute abnormality. KIDNEYS, URETERS AND BLADDER: No stones in the kidneys or ureters. No hydronephrosis. No perinephric or periureteral stranding. Urinary bladder is unremarkable. GI AND BOWEL: Mildly thick-walled loops of small bowel in the left mid abdomen with mild mesenteric stranding (image 44), raising concern for infectious/inflammatory enteritis. No pneumatosis or free air. Sigmoid diverticulosis, without evidence of diverticulitis. Normal appendix (image 48). REPRODUCTIVE: Brachytherapy seeds along the prostate. PERITONEUM AND RETROPERITONEUM: Small volume abdominopelvic ascites, predominantly perisplenic. LYMPH NODES: No lymphadenopathy. BONES AND SOFT TISSUES: No acute abnormality of the visualized bones. Tiny fat containing left inguinal hernia. Atherosclerotic calcifications of the abdominal aorta and branch vessels, although patent. IMPRESSION: 1. No evidence of pulmonary embolism. 2. Mild small bowel wall thickening with mesenteric stranding in the left mid abdomen, suggesting infectious/inflammatory enteritis. No pneumatosis or free air. 3. Radiation changes in the left hemithorax. No findings suspicious for recurrent or metastatic disease. 4. Additional ancillary findings as above. Electronically signed by: Pinkie Pebbles MD 10/30/2023 10:32 PM EDT RP Workstation: [id number]   CT ABDOMEN PELVIS W CONTRAST Result Date: 10/30/2023 EXAM: CTA CHEST PE WITHOUT AND WITH CONTRAST CT ABDOMEN AND PELVIS WITHOUT AND WITH  CONTRAST 10/30/2023 10:15:48 PM TECHNIQUE: CTA of the chest was performed after the administration of intravenous contrast. Multiplanar reformatted images are provided for review. MIP images are provided for review. CT of the abdomen and pelvis was performed with the administration of intravenous contrast. Automated exposure control, iterative reconstruction, and/or weight based adjustment of the mA/kV was utilized to reduce the radiation dose to as low as reasonably achievable. COMPARISON: CT chest abdomen and pelvis dated 06/22/2023. CLINICAL HISTORY: Rule out PE. Chest pain, dizziness, sepsis workup. N/V. eval for evidence of obstruction/ileus. FINDINGS: CHEST: PULMONARY ARTERIES: Pulmonary arteries are adequately opacified for evaluation. No intraluminal filling defect to suggest pulmonary embolism. Main pulmonary artery is normal in caliber. MEDIASTINUM: No mediastinal lymphadenopathy. The heart and pericardium demonstrate no acute abnormality. There is no acute abnormality of the thoracic aorta. Thoracic aortic atherosclerosis. LUNGS AND PLEURA: 6 mm subpleural left lower lobe nodule (image 67), previously 7 mm, likely benign. Additional peribronchovascular nodularity at the posterior left lung base (image 94), chronic. Radiation changes in the medial left upper lobe/perihilar region.  No focal consolidation or pulmonary edema. No pleural effusion or pneumothorax. SOFT TISSUES AND BONES: No acute bone or soft tissue abnormality. Right chest port terminates at the cavoatrial junction. ABDOMEN AND PELVIS: LIVER: Unremarkable. GALLBLADDER AND BILE DUCTS: Gallbladder is unremarkable. No biliary ductal dilatation. SPLEEN: Spleen demonstrates no acute abnormality. PANCREAS: Pancreas demonstrates no acute abnormality. ADRENAL GLANDS: Adrenal glands demonstrate no acute abnormality. KIDNEYS, URETERS AND BLADDER: No stones in the kidneys or ureters. No hydronephrosis. No perinephric or periureteral stranding. Urinary  bladder is unremarkable. GI AND BOWEL: Mildly thick-walled loops of small bowel in the left mid abdomen with mild mesenteric stranding (image 44), raising concern for infectious/inflammatory enteritis. No pneumatosis or free air. Sigmoid diverticulosis, without evidence of diverticulitis. Normal appendix (image 48). REPRODUCTIVE: Brachytherapy seeds along the prostate. PERITONEUM AND RETROPERITONEUM: Small volume abdominopelvic ascites, predominantly perisplenic. LYMPH NODES: No lymphadenopathy. BONES AND SOFT TISSUES: No acute abnormality of the visualized bones. Tiny fat containing left inguinal hernia. Atherosclerotic calcifications of the abdominal aorta and branch vessels, although patent. IMPRESSION: 1. No evidence of pulmonary embolism. 2. Mild small bowel wall thickening with mesenteric stranding in the left mid abdomen, suggesting infectious/inflammatory enteritis. No pneumatosis or free air. 3. Radiation changes in the left hemithorax. No findings suspicious for recurrent or metastatic disease. 4. Additional ancillary findings as above. Electronically signed by: Pinkie Pebbles MD 10/30/2023 10:32 PM EDT RP Workstation: [id number]   DG Chest Port 1 View Result Date: 10/30/2023 CLINICAL DATA:  Chest pain and dizziness. Central chest pain radiating to both sides. EXAM: PORTABLE CHEST 1 VIEW COMPARISON:  CT chest 06/22/2023 FINDINGS: Tracheostomy tube with tip measuring 4.7 cm above the carina. Power port type central venous catheter with tip over the cavoatrial junction region. No pneumothorax. Shallow inspiration. Mild cardiac enlargement. No vascular congestion or edema. Mild left perihilar and basilar infiltration may represent pneumonia or atelectasis. Probable small left pleural effusion. Calcification of the aorta. Degenerative changes in the spine and shoulders. IMPRESSION: Probable small left pleural effusion with infiltration or atelectasis in the left mid and lower lung zone, likely pneumonia.  Electronically Signed   By: Elsie Gravely M.D.   On: 10/30/2023 19:17     Assessment and Plan: ***   Risk Assessment/Risk Scores: {Complete the following score calculators/questions to meet required metrics.  Press F2         :[id number]}   {Is the patient being seen for unstable angina, ACS, NSTEMI or STEMI?[phone]} {Does this patient have CHF or CHF symptoms?      :[id number]} {Does this patient have ATRIAL FIBRILLATION?:[phone]}  {Are we signing off today?:[id number]}  For questions or updates, please contact [facility] Please consult [email or web address] for contact info under    {TIP  Split Shared Billing  Do NOT delete any part of this including brackets If split shared billing is based upon MDM, disregard If billing will be based upon TIME you MUST document the number of minutes and a detailed list of what was done in that time in the following format Example - I spent ** minutes seeing this patient. During that time I reviewed their history, evaluated their symptoms, reviewed available labs, EKGs, studies, performed an exam and formulated an assessment and plan   :1} {Select this only if you need to document critical care time (Optional):[phone]} Signed, Gillian CHRISTELLA Cass, MD  10/30/2023 11:59 PM

## 2023-10-30 NOTE — ED Triage Notes (Signed)
 [facility] arriving with patient from home.   Patient was playing cards with his wife and patient started getting dizzy. He then began having chest pain in the center of the chest moving to both sides. Chest pain went away with rest and recovery.  EMS vitals: BP 118/62 HR 80 O2 at 98 on RA CBG 130

## 2023-10-30 NOTE — Consult Note (Addendum)
 Cardiology Consultation   Shaun Kennedy MRN: [id number]; DOB: 1940/05/14  Admit date: 10/30/2023 Date of Consult: 10/30/2023  PCP:  [name], MD   [facility] Providers Cardiologist:  None        Shaun Profile: Shaun Kennedy is a 83 y.o. male with a hx of stage IV non-small cell lung cancer, squamous cell carcinoma presented with obstructive left lower lobe s/p tracheostomy, T2DM, HTN, and HLD who is being seen 10/30/2023 for the evaluation of pre-syncope.  History of Present Illness: Shaun Kennedy reports that he was sitting at home and talking to his wife when he started to feel lightheaded and felt like he's about to pass out. He does not believe he lost his consciousness ED notes indicate that he did. He report that this feeling lasted for a few minutes and then resolved spontaneously. He reports no chest pain, SOB, or palpitations. No headache, vision changes, weakness and sensation changes anywhere. He reports no prior similar symptoms. No recurrence since his presentation in the ED. No recent changes in his medications.   While in the ED, telemetry showed ~1 minute of complete heart block. He remained hemodynamically stable during that episode and does not recall any symptoms while being in the ED.  EKG showed sinus rhythm and first degree AV block. Troponin 8=>12.  Past Medical History:  Diagnosis Date   Allergic rhinitis    Asthma    Carotid stenosis    Colon cancer (HCC) 2003   Diabetes mellitus (HCC)    Diverticulosis    Dyslipidemia    ED (erectile dysfunction)    GERD (gastroesophageal reflux disease)    H/O degenerative disc disease    Hemorrhoids    HTN (hypertension)    as a child   Hyperlipidemia    Lung cancer (HCC)    LVH (left ventricular hypertrophy)    on EKG   Mass of lower lobe of left lung    Mediastinal adenopathy    Smoker    former   Wears dentures    Wears dentures    Wears glasses    Wears glasses     Past  Surgical History:  Procedure Laterality Date   BRONCHIAL BIOPSY  08/20/2019   Procedure: BRONCHIAL BIOPSIES;  Surgeon: Shelah Lamar RAMAN, MD;  Location: [facility] ENDOSCOPY;  Service: Pulmonary;;   BRONCHIAL BRUSHINGS  08/20/2019   Procedure: BRONCHIAL BRUSHINGS;  Surgeon: Shelah Lamar RAMAN, MD;  Location: [facility] ENDOSCOPY;  Service: Pulmonary;;   BRONCHIAL NEEDLE ASPIRATION BIOPSY  08/20/2019   Procedure: BRONCHIAL NEEDLE ASPIRATION BIOPSIES;  Surgeon: Shelah Lamar RAMAN, MD;  Location: [facility] ENDOSCOPY;  Service: Pulmonary;;   CARPAL TUNNEL RELEASE Right 01/09/2019   Procedure: CARPAL TUNNEL RELEASE;  Surgeon: Jerri Kay HERO, MD;  Location: [facility];  Service: Orthopedics;  Laterality: Right;   CARPAL TUNNEL WITH CUBITAL TUNNEL Right 11/18/2020   Procedure: CARPAL TUNNEL WITH CUBITAL TUNNEL;  Surgeon: Sissy Cough, MD;  Location: [facility] OR;  Service: Orthopedics;  Laterality: Right;   CATARACT EXTRACTION Right 2018   COLONOSCOPY  2007   Dr. Rollin   DIRECT LARYNGOSCOPY N/A 04/10/2020   Procedure: DIRECT LARYNGOSCOPY;  Surgeon: Carlie Clark, MD;  Location: [facility] OR;  Service: ENT;  Laterality: N/A;   I & D EXTREMITY Right 04/02/2016   Procedure: IRRIGATION AND DEBRIDEMENT GREAT TOE;  Surgeon: Kay HERO Jerri, MD;  Location: [facility] OR;  Service: Orthopedics;  Laterality: Right;   IR IMAGING GUIDED PORT INSERTION  08/30/2019   MULTIPLE TOOTH EXTRACTIONS     RADIOACTIVE SEED IMPLANT  2003   TRACHEOSTOMY TUBE PLACEMENT N/A 04/10/2020   Procedure: TRACHEOSTOMY;  Surgeon: Carlie Clark, MD;  Location: [facility] OR;  Service: ENT;  Laterality: N/A;   ULNAR TUNNEL RELEASE Right 01/09/2019   Procedure: RIGHT CUBITAL TUNNEL RELEASE AND CARPAL TUNNEL RELEASE;  Surgeon: Jerri Kay HERO, MD;  Location: [facility];  Service: Orthopedics;  Laterality: Right;   UPPER GASTROINTESTINAL ENDOSCOPY     VIDEO BRONCHOSCOPY N/A 12/18/2019   Procedure: VIDEO BRONCHOSCOPY WITHOUT FLUORO;  Surgeon: Shelah Lamar RAMAN, MD;  Location: [facility]  ENDOSCOPY;  Service: Cardiopulmonary;  Laterality: N/A;   VIDEO BRONCHOSCOPY WITH ENDOBRONCHIAL ULTRASOUND N/A 08/20/2019   Procedure: VIDEO BRONCHOSCOPY WITH ENDOBRONCHIAL ULTRASOUND;  Surgeon: Shelah Lamar RAMAN, MD;  Location: [facility] ENDOSCOPY;  Service: Pulmonary;  Laterality: N/A;     Home Medications:  Prior to Admission medications   Medication Sig Start Date End Date Taking? Authorizing Provider  Alcohol  Swabs (DROPSAFE ALCOHOL  PREP) 70 % PADS Apply topically. 08/26/20   [provider]  amLODipine  (NORVASC ) 5 MG tablet TAKE 1 TABLET (5 MG TOTAL) BY MOUTH DAILY. 06/14/23   [name], MD  aspirin  EC 81 MG tablet Take 81 mg by mouth daily after breakfast.     [provider]  bisacodyl (DULCOLAX) 5 MG EC tablet Take 10 mg by mouth daily as needed for moderate constipation.    [provider]  Blood Glucose Monitoring Suppl DEVI 1 each by Does not apply route in the morning, at noon, and at bedtime. May substitute to any manufacturer covered by Shaun's insurance. 03/30/23   Lalonde, John C, MD  Blood Glucose Monitoring Suppl DEVI 1 each by Does not apply route in the morning, at noon, and at bedtime. May substitute to any manufacturer covered by Shaun's insurance. 10/26/23   [name], MD  cholecalciferol  (VITAMIN D ) 25 MCG (1000 UNIT) tablet Take 1,000 Units by mouth daily.    [provider]  donepezil  (ARICEPT ) 10 MG tablet Take 1 tablet (10 mg total) by mouth at bedtime. 06/14/23   [name], MD  ferrous sulfate  325 (65 FE) MG EC tablet TAKE 1 TABLET EVERY DAY WITH BREAKFAST 06/14/23   Lalonde, John C, MD  Glucose Blood (BLOOD GLUCOSE TEST STRIPS) STRP 1 each by In Vitro route in the morning, at noon, and at bedtime. May substitute to any manufacturer covered by Shaun's insurance. 10/26/23 11/25/23  [name], MD  hydrOXYzine  (ATARAX ) 10 MG tablet Take 1 tablet (10 mg total) by mouth 3 (three) times daily as needed. 10/13/21   Sherrod Sherrod,  MD  Lancet Device MISC 1 each by Does not apply route in the morning, at noon, and at bedtime. May substitute to any manufacturer covered by Shaun's insurance. 10/26/23 11/25/23  [name], MD  Lancets Misc. MISC 1 each by Does not apply route in the morning, at noon, and at bedtime. May substitute to any manufacturer covered by Shaun's insurance. 10/26/23 11/25/23  [name], MD  lidocaine -prilocaine  (EMLA ) cream Apply 1 Application topically as needed. 04/12/23   Sherrod Sherrod, MD  lisinopril -hydrochlorothiazide  (ZESTORETIC ) 20-12.5 MG tablet Take 1 tablet by mouth daily. 06/14/23   [name], MD  memantine  (NAMENDA ) 10 MG tablet TAKE 1 TABLET TWICE DAILY 05/01/23   Lalonde, John C, MD  metFORMIN  (GLUCOPHAGE -XR) 500 MG 24 hr tablet Take 1 tablet (500 mg total) by mouth daily. 06/14/23  Lalonde, John C, MD  Multiple Vitamin (MULTIVITAMIN WITH MINERALS) TABS tablet Take 1 tablet by mouth daily after breakfast.     [provider]  simvastatin  (ZOCOR ) 40 MG tablet Take 1 tablet (40 mg total) by mouth daily. 06/14/23   [name], MD  solifenacin  (VESICARE ) 5 MG tablet Take 1 tablet (5 mg total) by mouth daily. 06/14/23   [name], MD    Scheduled Meds:  Continuous Infusions:  [START ON 10/31/2023] sodium chloride      piperacillin -tazobactam 3.375 g (10/30/23 2333)   PRN Meds: acetaminophen  **OR** acetaminophen , melatonin, ondansetron  (ZOFRAN ) IV  Allergies:   No Known Allergies  Social History:   Social History   Socioeconomic History   Marital status: Married    Spouse name: Not on file   Number of children: 3   Years of education: Not on file   Highest education level: Not on file  Occupational History   Occupation: retired  Tobacco Use   Smoking status: Former    Current packs/day: 0.00    Average packs/day: 1 pack/day for 32.0 years (32.0 ttl pk-yrs)    Types: Cigarettes    Start date: 02/08/1955    Quit date: 10/08/1985    Years since  quitting: 38.0   Smokeless tobacco: Never  Vaping Use   Vaping status: Never Used  Substance and Sexual Activity   Alcohol  use: No   Drug use: No   Sexual activity: Yes  Other Topics Concern   Not on file  Social History Narrative   Married, no pets   Social Drivers of Health   Financial Resource Strain: Low Risk  (04/25/2023)   Overall Financial Resource Strain (CARDIA)    Difficulty of Paying Living Expenses: Not hard at all  Food Insecurity: No Food Insecurity (10/26/2023)   Hunger Vital Sign    Worried About Running Out of Food in the Last Year: Never true    Ran Out of Food in the Last Year: Never true  Transportation Needs: No Transportation Needs (10/26/2023)   PRAPARE - Administrator, Civil Service (Medical): No    Lack of Transportation (Non-Medical): No  Physical Activity: Sufficiently Active (04/25/2023)   Exercise Vital Sign    Days of Exercise per Week: 3 days    Minutes of Exercise per Session: 90 min  Stress: No Stress Concern Present (04/25/2023)   [employer] of Occupational Health - Occupational Stress Questionnaire    Feeling of Stress : Not at all  Social Connections: Moderately Integrated (04/25/2023)   Social Connection and Isolation Panel    Frequency of Communication with Friends and Family: More than three times a week    Frequency of Social Gatherings with Friends and Family: More than three times a week    Attends Religious Services: More than 4 times per year    Active Member of [employer] or Organizations: No    Attends Banker Meetings: Never    Marital Status: Married  Catering manager Violence: Not At Risk (10/26/2023)   Humiliation, Afraid, Rape, and Kick questionnaire    Fear of Current or Ex-Partner: No    Emotionally Abused: No    Physically Abused: No    Sexually Abused: No    Family History:    Family History  Problem Relation Age of Onset   Heart failure Mother    Diabetes Mother    Stroke Father     Diabetes Father    Colon cancer Sister  Diabetes Sister    Diabetes Sister    Lung cancer Sister 38   Esophageal cancer Neg Hx    Rectal cancer Neg Hx    Colon polyps Neg Hx    Stomach cancer Neg Hx      ROS:  Please see the history of present illness.   All other ROS reviewed and negative.     Physical Exam/Data: Vitals:   10/30/23 2230 10/30/23 2245 10/30/23 2315 10/30/23 2348  BP: 116/63  114/63   Pulse: 76  74 76  Resp: 13  11 16   Temp:  97.8 F (36.6 C)    TempSrc:  Oral    SpO2: 100%  100% 100%   No intake or output data in the 24 hours ending 10/30/23 2359    08/23/2023   11:23 AM 06/27/2023    9:31 AM 06/14/2023   10:45 AM  Last 3 Weights  Weight (lbs) 187 lb 12.8 oz 188 lb 11.2 oz 193 lb  Weight (kg) 85.186 kg 85.594 kg 87.544 kg     There is no height or weight on file to calculate BMI.  General:  Well nourished, well developed, in no acute distress HEENT: normal Neck: no JVD Vascular: Distal pulses 2+ bilaterally Cardiac:  normal S1, S2; RRR; no murmur  Lungs:  clear to auscultation bilaterally, no wheezing, rhonchi or rales  Abd: soft, nontender, no hepatomegaly  Ext: no edema Musculoskeletal:  No deformities, BUE and BLE strength normal and equal Skin: warm and dry  Neuro:  CNs 2-12 intact, no focal abnormalities noted Psych:  Normal affect   EKG:  The EKG was personally reviewed and demonstrates: sinus rhythm and first degree AV block Telemetry:  Telemetry was personally reviewed and demonstrates:  1 minute of complete heart block; otherwise sinus rhythm with 1st degree AV block  Relevant CV Studies: none  Laboratory Data: High Sensitivity Troponin:   Recent Labs  Lab 10/30/23 1829 10/30/23 2120  TROPONINIHS 8 12     Chemistry Recent Labs  Lab 10/30/23 1829 10/30/23 1926 10/30/23 1951  NA 137  --  140  K 3.6  --  3.7  CL 104  --  107  CO2 20*  --   --   GLUCOSE 99  --  103*  BUN 23  --  25*  CREATININE 1.71*  --  1.80*  CALCIUM  8.8*  --   --   MG  --  1.8  --   GFRNONAA 39*  --   --   ANIONGAP 13  --   --     Recent Labs  Lab 10/30/23 1829  PROT 7.4  ALBUMIN 3.1*  AST 20  ALT 13  ALKPHOS 45  BILITOT 1.2   Lipids No results for input(s): CHOL, TRIG, HDL, LABVLDL, LDLCALC, CHOLHDL in the last 168 hours.  Hematology Recent Labs  Lab 10/30/23 1829 10/30/23 1951  WBC 12.0*  --   RBC 3.90*  --   HGB 12.1* 12.6*  HCT 37.5* 37.0*  MCV 96.2  --   MCH 31.0  --   MCHC 32.3  --   RDW 12.4  --   PLT 335  --    Thyroid   Recent Labs  Lab 10/30/23 1939  TSH 2.196    BNPNo results for input(s): BNP, PROBNP in the last 168 hours.  DDimer  Recent Labs  Lab 10/30/23 1829  DDIMER >20.00*    Radiology/Studies:  CT Angio Chest PE W and/or Wo Contrast Result  Date: 10/30/2023 EXAM: CTA CHEST PE WITHOUT AND WITH CONTRAST CT ABDOMEN AND PELVIS WITHOUT AND WITH CONTRAST 10/30/2023 10:15:48 PM TECHNIQUE: CTA of the chest was performed after the administration of intravenous contrast. Multiplanar reformatted images are provided for review. MIP images are provided for review. CT of the abdomen and pelvis was performed with the administration of intravenous contrast. Automated exposure control, iterative reconstruction, and/or weight based adjustment of the mA/kV was utilized to reduce the radiation dose to as low as reasonably achievable. COMPARISON: CT chest abdomen and pelvis dated 06/22/2023. CLINICAL HISTORY: Rule out PE. Chest pain, dizziness, sepsis workup. N/V. eval for evidence of obstruction/ileus. FINDINGS: CHEST: PULMONARY ARTERIES: Pulmonary arteries are adequately opacified for evaluation. No intraluminal filling defect to suggest pulmonary embolism. Main pulmonary artery is normal in caliber. MEDIASTINUM: No mediastinal lymphadenopathy. The heart and pericardium demonstrate no acute abnormality. There is no acute abnormality of the thoracic aorta. Thoracic aortic atherosclerosis. LUNGS AND  PLEURA: 6 mm subpleural left lower lobe nodule (image 67), previously 7 mm, likely benign. Additional peribronchovascular nodularity at the posterior left lung base (image 94), chronic. Radiation changes in the medial left upper lobe/perihilar region. No focal consolidation or pulmonary edema. No pleural effusion or pneumothorax. SOFT TISSUES AND BONES: No acute bone or soft tissue abnormality. Right chest port terminates at the cavoatrial junction. ABDOMEN AND PELVIS: LIVER: Unremarkable. GALLBLADDER AND BILE DUCTS: Gallbladder is unremarkable. No biliary ductal dilatation. SPLEEN: Spleen demonstrates no acute abnormality. PANCREAS: Pancreas demonstrates no acute abnormality. ADRENAL GLANDS: Adrenal glands demonstrate no acute abnormality. KIDNEYS, URETERS AND BLADDER: No stones in the kidneys or ureters. No hydronephrosis. No perinephric or periureteral stranding. Urinary bladder is unremarkable. GI AND BOWEL: Mildly thick-walled loops of small bowel in the left mid abdomen with mild mesenteric stranding (image 44), raising concern for infectious/inflammatory enteritis. No pneumatosis or free air. Sigmoid diverticulosis, without evidence of diverticulitis. Normal appendix (image 48). REPRODUCTIVE: Brachytherapy seeds along the prostate. PERITONEUM AND RETROPERITONEUM: Small volume abdominopelvic ascites, predominantly perisplenic. LYMPH NODES: No lymphadenopathy. BONES AND SOFT TISSUES: No acute abnormality of the visualized bones. Tiny fat containing left inguinal hernia. Atherosclerotic calcifications of the abdominal aorta and branch vessels, although patent. IMPRESSION: 1. No evidence of pulmonary embolism. 2. Mild small bowel wall thickening with mesenteric stranding in the left mid abdomen, suggesting infectious/inflammatory enteritis. No pneumatosis or free air. 3. Radiation changes in the left hemithorax. No findings suspicious for recurrent or metastatic disease. 4. Additional ancillary findings as above.  Electronically signed by: Pinkie Pebbles MD 10/30/2023 10:32 PM EDT RP Workstation: [id number]   CT ABDOMEN PELVIS W CONTRAST Result Date: 10/30/2023 EXAM: CTA CHEST PE WITHOUT AND WITH CONTRAST CT ABDOMEN AND PELVIS WITHOUT AND WITH CONTRAST 10/30/2023 10:15:48 PM TECHNIQUE: CTA of the chest was performed after the administration of intravenous contrast. Multiplanar reformatted images are provided for review. MIP images are provided for review. CT of the abdomen and pelvis was performed with the administration of intravenous contrast. Automated exposure control, iterative reconstruction, and/or weight based adjustment of the mA/kV was utilized to reduce the radiation dose to as low as reasonably achievable. COMPARISON: CT chest abdomen and pelvis dated 06/22/2023. CLINICAL HISTORY: Rule out PE. Chest pain, dizziness, sepsis workup. N/V. eval for evidence of obstruction/ileus. FINDINGS: CHEST: PULMONARY ARTERIES: Pulmonary arteries are adequately opacified for evaluation. No intraluminal filling defect to suggest pulmonary embolism. Main pulmonary artery is normal in caliber. MEDIASTINUM: No mediastinal lymphadenopathy. The heart and pericardium demonstrate no acute abnormality. There is no acute abnormality of  the thoracic aorta. Thoracic aortic atherosclerosis. LUNGS AND PLEURA: 6 mm subpleural left lower lobe nodule (image 67), previously 7 mm, likely benign. Additional peribronchovascular nodularity at the posterior left lung base (image 94), chronic. Radiation changes in the medial left upper lobe/perihilar region. No focal consolidation or pulmonary edema. No pleural effusion or pneumothorax. SOFT TISSUES AND BONES: No acute bone or soft tissue abnormality. Right chest port terminates at the cavoatrial junction. ABDOMEN AND PELVIS: LIVER: Unremarkable. GALLBLADDER AND BILE DUCTS: Gallbladder is unremarkable. No biliary ductal dilatation. SPLEEN: Spleen demonstrates no acute abnormality. PANCREAS: Pancreas  demonstrates no acute abnormality. ADRENAL GLANDS: Adrenal glands demonstrate no acute abnormality. KIDNEYS, URETERS AND BLADDER: No stones in the kidneys or ureters. No hydronephrosis. No perinephric or periureteral stranding. Urinary bladder is unremarkable. GI AND BOWEL: Mildly thick-walled loops of small bowel in the left mid abdomen with mild mesenteric stranding (image 44), raising concern for infectious/inflammatory enteritis. No pneumatosis or free air. Sigmoid diverticulosis, without evidence of diverticulitis. Normal appendix (image 48). REPRODUCTIVE: Brachytherapy seeds along the prostate. PERITONEUM AND RETROPERITONEUM: Small volume abdominopelvic ascites, predominantly perisplenic. LYMPH NODES: No lymphadenopathy. BONES AND SOFT TISSUES: No acute abnormality of the visualized bones. Tiny fat containing left inguinal hernia. Atherosclerotic calcifications of the abdominal aorta and branch vessels, although patent. IMPRESSION: 1. No evidence of pulmonary embolism. 2. Mild small bowel wall thickening with mesenteric stranding in the left mid abdomen, suggesting infectious/inflammatory enteritis. No pneumatosis or free air. 3. Radiation changes in the left hemithorax. No findings suspicious for recurrent or metastatic disease. 4. Additional ancillary findings as above. Electronically signed by: Pinkie Pebbles MD 10/30/2023 10:32 PM EDT RP Workstation: [id number]   DG Chest Port 1 View Result Date: 10/30/2023 CLINICAL DATA:  Chest pain and dizziness. Central chest pain radiating to both sides. EXAM: PORTABLE CHEST 1 VIEW COMPARISON:  CT chest 06/22/2023 FINDINGS: Tracheostomy tube with tip measuring 4.7 cm above the carina. Power port type central venous catheter with tip over the cavoatrial junction region. No pneumothorax. Shallow inspiration. Mild cardiac enlargement. No vascular congestion or edema. Mild left perihilar and basilar infiltration may represent pneumonia or atelectasis. Probable small  left pleural effusion. Calcification of the aorta. Degenerative changes in the spine and shoulders. IMPRESSION: Probable small left pleural effusion with infiltration or atelectasis in the left mid and lower lung zone, likely pneumonia. Electronically Signed   By: Elsie Gravely M.D.   On: 10/30/2023 19:17     Assessment and Plan: Pre-syncope Intermittent CHB Evidence of ~1 minute of CHB while in the ED; he remained asymptomatic during this episode. This is in the setting of presenting after a pre-syncopal episode at home. Baseline EKG with 1st degree AV block but no other conduction issues. No use of AV nodal blocking agents. No clear reversible causes. - For now, continue to monitor on telemetry  - Obtain TTE - Connect Shaun to pads - EP to see in the AM  Risk Assessment/Risk Scores:              For questions or updates, please contact [facility] Please consult [email or web address] for contact info under      Signed, Gillian CHRISTELLA Cass, MD  10/30/2023 11:59 PM

## 2023-10-31 ENCOUNTER — Other Ambulatory Visit (HOSPITAL_COMMUNITY)

## 2023-10-31 ENCOUNTER — Telehealth: Payer: Self-pay

## 2023-10-31 ENCOUNTER — Observation Stay (HOSPITAL_COMMUNITY)

## 2023-10-31 ENCOUNTER — Encounter (HOSPITAL_COMMUNITY): Payer: Self-pay | Admitting: Internal Medicine

## 2023-10-31 ENCOUNTER — Other Ambulatory Visit: Payer: Self-pay

## 2023-10-31 DIAGNOSIS — N179 Acute kidney failure, unspecified: Secondary | ICD-10-CM | POA: Insufficient documentation

## 2023-10-31 DIAGNOSIS — N183 Chronic kidney disease, stage 3 unspecified: Secondary | ICD-10-CM | POA: Insufficient documentation

## 2023-10-31 DIAGNOSIS — R55 Syncope and collapse: Secondary | ICD-10-CM

## 2023-10-31 DIAGNOSIS — J189 Pneumonia, unspecified organism: Secondary | ICD-10-CM | POA: Insufficient documentation

## 2023-10-31 LAB — CBC WITH DIFFERENTIAL/PLATELET
Abs Immature Granulocytes: 0.03 K/uL (ref 0.00–0.07)
Basophils Absolute: 0 K/uL (ref 0.0–0.1)
Basophils Relative: 0 %
Eosinophils Absolute: 0.3 K/uL (ref 0.0–0.5)
Eosinophils Relative: 3 %
HCT: 30.2 % — ABNORMAL LOW (ref 39.0–52.0)
Hemoglobin: 10 g/dL — ABNORMAL LOW (ref 13.0–17.0)
Immature Granulocytes: 0 %
Lymphocytes Relative: 11 %
Lymphs Abs: 0.9 K/uL (ref 0.7–4.0)
MCH: 31.5 pg (ref 26.0–34.0)
MCHC: 33.1 g/dL (ref 30.0–36.0)
MCV: 95.3 fL (ref 80.0–100.0)
Monocytes Absolute: 0.7 K/uL (ref 0.1–1.0)
Monocytes Relative: 8 %
Neutro Abs: 6.9 K/uL (ref 1.7–7.7)
Neutrophils Relative %: 78 %
Platelets: 287 K/uL (ref 150–400)
RBC: 3.17 MIL/uL — ABNORMAL LOW (ref 4.22–5.81)
RDW: 12.5 % (ref 11.5–15.5)
WBC: 8.8 K/uL (ref 4.0–10.5)
nRBC: 0 % (ref 0.0–0.2)

## 2023-10-31 LAB — ECHOCARDIOGRAM COMPLETE
AR max vel: 2.93 cm2
AV Area VTI: 2.8 cm2
AV Area mean vel: 2.62 cm2
AV Mean grad: 4 mmHg
AV Peak grad: 6 mmHg
Ao pk vel: 1.22 m/s
Area-P 1/2: 3.26 cm2
Height: 68 in
S' Lateral: 2.7 cm
Weight: 2984.15 [oz_av]

## 2023-10-31 LAB — GLUCOSE, CAPILLARY
Glucose-Capillary: 114 mg/dL — ABNORMAL HIGH (ref 70–99)
Glucose-Capillary: 107 mg/dL — ABNORMAL HIGH (ref 70–99)

## 2023-10-31 LAB — COMPREHENSIVE METABOLIC PANEL WITH GFR
ALT: 12 U/L (ref 0–44)
AST: 15 U/L (ref 15–41)
Albumin: 2.7 g/dL — ABNORMAL LOW (ref 3.5–5.0)
Alkaline Phosphatase: 40 U/L (ref 38–126)
Anion gap: 7 (ref 5–15)
BUN: 18 mg/dL (ref 8–23)
CO2: 21 mmol/L — ABNORMAL LOW (ref 22–32)
Calcium: 8 mg/dL — ABNORMAL LOW (ref 8.9–10.3)
Chloride: 108 mmol/L (ref 98–111)
Creatinine, Ser: 1.45 mg/dL — ABNORMAL HIGH (ref 0.61–1.24)
GFR, Estimated: 47 mL/min — ABNORMAL LOW (ref >60)
Glucose, Bld: 101 mg/dL — ABNORMAL HIGH (ref 70–99)
Potassium: 3.7 mmol/L (ref 3.5–5.1)
Sodium: 136 mmol/L (ref 135–145)
Total Bilirubin: 0.9 mg/dL (ref 0.0–1.2)
Total Protein: 6.4 g/dL — ABNORMAL LOW (ref 6.5–8.1)

## 2023-10-31 LAB — TROPONIN I (HIGH SENSITIVITY): Troponin I (High Sensitivity): 10 ng/L (ref <18)

## 2023-10-31 LAB — PROCALCITONIN: Procalcitonin: <0.1 ng/mL

## 2023-10-31 LAB — BRAIN NATRIURETIC PEPTIDE: B Natriuretic Peptide: 69.2 pg/mL (ref 0.0–100.0)

## 2023-10-31 LAB — MAGNESIUM: Magnesium: 1.6 mg/dL — ABNORMAL LOW (ref 1.7–2.4)

## 2023-10-31 MED ORDER — CHLORHEXIDINE GLUCONATE CLOTH 2 % EX PADS
6.0000 | MEDICATED_PAD | Freq: Every day | CUTANEOUS | Status: DC
Start: 2023-10-31 — End: 2023-11-03
  Administered 2023-10-31 – 2023-11-03 (×4): 6 via TOPICAL

## 2023-10-31 MED ORDER — SODIUM CHLORIDE 0.9% FLUSH
10.0000 mL | Freq: Two times a day (BID) | INTRAVENOUS | Status: DC
  Administered 2023-10-31 – 2023-11-03 (×6): 10 mL

## 2023-10-31 MED ORDER — INSULIN ASPART 100 UNIT/ML IJ SOLN
0.0000 [IU] | Freq: Three times a day (TID) | INTRAMUSCULAR | Status: DC
  Administered 2023-11-02: 1 [IU] via SUBCUTANEOUS

## 2023-10-31 MED ORDER — MAGNESIUM SULFATE 2 GM/50ML IV SOLN
2.0000 g | Freq: Once | INTRAVENOUS | Status: AC
  Administered 2023-10-31: 2 g via INTRAVENOUS
  Filled 2023-10-31: qty 50

## 2023-10-31 MED ORDER — SODIUM CHLORIDE 0.9% FLUSH: 10.0000 mL | INTRAVENOUS | Status: DC | PRN

## 2023-10-31 MED ORDER — INSULIN ASPART 100 UNIT/ML IJ SOLN: 0.0000 [IU] | Freq: Every day | INTRAMUSCULAR | Status: DC

## 2023-10-31 MED ORDER — DONEPEZIL HCL 10 MG PO TABS
10.0000 mg | ORAL_TABLET | Freq: Every day | ORAL | Status: DC
  Administered 2023-10-31 – 2023-11-02 (×3): 10 mg via ORAL
  Filled 2023-10-31 (×3): qty 1

## 2023-10-31 MED ORDER — ALBUTEROL SULFATE (2.5 MG/3ML) 0.083% IN NEBU: 2.5000 mg | INHALATION_SOLUTION | RESPIRATORY_TRACT | Status: DC | PRN

## 2023-10-31 MED ORDER — MELATONIN 5 MG PO TABS: 5.0000 mg | ORAL_TABLET | Freq: Every evening | ORAL | Status: DC | PRN

## 2023-10-31 MED ORDER — VITAMIN D 25 MCG (1000 UNIT) PO TABS
1000.0000 [IU] | ORAL_TABLET | Freq: Every day | ORAL | Status: DC
  Administered 2023-11-01 – 2023-11-03 (×3): 1000 [IU] via ORAL
  Filled 2023-10-31 (×3): qty 1

## 2023-10-31 MED ORDER — POLYETHYLENE GLYCOL 3350 17 G PO PACK: 17.0000 g | PACK | Freq: Every day | ORAL | Status: DC | PRN

## 2023-10-31 MED ORDER — POLYVINYL ALCOHOL 1.4 % OP SOLN: 1.0000 [drp] | Freq: Every day | OPHTHALMIC | Status: DC | PRN

## 2023-10-31 MED ORDER — SODIUM CHLORIDE 0.9 % IV SOLN: INTRAVENOUS | Status: AC

## 2023-10-31 MED ORDER — FERROUS SULFATE 325 (65 FE) MG PO TABS
325.0000 mg | ORAL_TABLET | Freq: Every day | ORAL | Status: DC
  Administered 2023-11-01 – 2023-11-03 (×3): 325 mg via ORAL
  Filled 2023-10-31 (×3): qty 1

## 2023-10-31 MED ORDER — ENOXAPARIN SODIUM 40 MG/0.4ML IJ SOSY
40.0000 mg | PREFILLED_SYRINGE | INTRAMUSCULAR | Status: DC
  Administered 2023-10-31 – 2023-11-02 (×3): 40 mg via SUBCUTANEOUS
  Filled 2023-10-31 (×3): qty 0.4

## 2023-10-31 MED ORDER — SIMVASTATIN 20 MG PO TABS
40.0000 mg | ORAL_TABLET | Freq: Every day | ORAL | Status: DC
  Administered 2023-10-31 – 2023-11-03 (×4): 40 mg via ORAL
  Filled 2023-10-31 (×4): qty 2

## 2023-10-31 MED ORDER — ASPIRIN 81 MG PO TBEC
81.0000 mg | DELAYED_RELEASE_TABLET | Freq: Every day | ORAL | Status: DC
  Administered 2023-11-01 – 2023-11-03 (×3): 81 mg via ORAL
  Filled 2023-10-31 (×3): qty 1

## 2023-10-31 MED ORDER — PANTOPRAZOLE SODIUM 40 MG PO TBEC
40.0000 mg | DELAYED_RELEASE_TABLET | Freq: Every day | ORAL | Status: DC
  Administered 2023-10-31 – 2023-11-03 (×4): 40 mg via ORAL
  Filled 2023-10-31 (×4): qty 1

## 2023-10-31 MED ORDER — GUAIFENESIN ER 600 MG PO TB12
600.0000 mg | ORAL_TABLET | Freq: Two times a day (BID) | ORAL | Status: DC
  Administered 2023-10-31 – 2023-11-03 (×7): 600 mg via ORAL
  Filled 2023-10-31 (×7): qty 1

## 2023-10-31 MED ORDER — PIPERACILLIN-TAZOBACTAM 3.375 G IVPB
3.3750 g | Freq: Three times a day (TID) | INTRAVENOUS | Status: DC
  Administered 2023-10-31 – 2023-11-02 (×7): 3.375 g via INTRAVENOUS
  Filled 2023-10-31 (×8): qty 50

## 2023-10-31 NOTE — ED Notes (Signed)
 Daughter called and would like an updated. Shaun Kennedy [phone]

## 2023-10-31 NOTE — Plan of Care (Signed)
   Problem: Education: Goal: Knowledge of condition and prescribed therapy will improve Outcome: Progressing   Problem: Cardiac: Goal: Will achieve and/or maintain adequate cardiac output Outcome: Progressing   Problem: Physical Regulation: Goal: Complications related to the disease process, condition or treatment will be avoided or minimized Outcome: Progressing

## 2023-10-31 NOTE — Consult Note (Addendum)
 ELECTROPHYSIOLOGY CONSULT NOTE    Patient ID: Shaun Kennedy MRN: [id number], DOB/AGE: [birth year] 85 y.o.  Admit date: 10/30/2023 Date of Consult: 10/31/2023  Primary Physician: [name], MD Primary Cardiologist: None  Electrophysiologist: Dr. Inocencio   Referring Provider: @ATTENDING @  Patient Profile: Shaun Kennedy is a 83 y.o. male with a history of stage IV non-small cell lung cancer, squamous cell carcinoma, tracheostomy, T2DM, HTN, and HLD who is being seen today for the evaluation of reported brief complete heart block at the request of Dr. Mona.  HPI:  Shaun Kennedy is a 83 y.o. male with above noted medical history. Patient presented to the ED yesterday after an episode of reported syncope at home. Patient and his spouse were playing cards when he suddenly became severely lightheaded. Per family at bedside today, patient reportedly had his head fall forward and was non-responsive to voice for 5-10 seconds, indicating true syncope. After this brief period of unresponsiveness, patient awake but continued to feel lightheaded for several minutes, then had spontaneous resolution. Following syncope, patient also reports nausea with an episode of emesis. Denies feeling poorly prior to syncope. Patient denies coughing spell or nausea prior to syncope. Upon arrival to the ED, patient hypotensive and received IV fluids.  Per notes, in the ED patient had an episode of bradycardia and subsequent hypotension, also an episode of emesis at the same time. Per ED provider, felt to have had brief ~1 min episode of CHB in conjunction with this episode (rhythm strip unavailable). In the ED, workup reassuring with normal electrolytes. Patient reported some chest pain prompting workup that found negative troponin. CXR with infiltrates in left mid and lower lung. Given infiltrates and chronic trach, patient given Zosyn . CTA chest without PE. CT abd/pelvis did note some abdominal findings consistent  with in infectious/inflammatory enteritis.    Labs Potassium3.7 (09/23 0453) Magnesium   1.6* (09/23 0453) Creatinine, ser  1.45* (09/23 0453) PLT  287 (09/23 0453) HGB  10.0* (09/23 0453) WBC 8.8 (09/23 0453) Troponin I (High Sensitivity)12 (09/22 2120).    Past Medical History:  Diagnosis Date   Allergic rhinitis    Asthma    Carotid stenosis    Colon cancer (HCC) 2003   Diabetes mellitus (HCC)    Diverticulosis    Dyslipidemia    ED (erectile dysfunction)    GERD (gastroesophageal reflux disease)    H/O degenerative disc disease    Hemorrhoids    HTN (hypertension)    as a child   Hyperlipidemia    Lung cancer (HCC)    LVH (left ventricular hypertrophy)    on EKG   Mass of lower lobe of left lung    Mediastinal adenopathy    Smoker    former   Wears dentures    Wears dentures    Wears glasses    Wears glasses      Surgical History:  Past Surgical History:  Procedure Laterality Date   BRONCHIAL BIOPSY  08/20/2019   Procedure: BRONCHIAL BIOPSIES;  Surgeon: Shelah Lamar RAMAN, MD;  Location: [facility] ENDOSCOPY;  Service: Pulmonary;;   BRONCHIAL BRUSHINGS  08/20/2019   Procedure: BRONCHIAL BRUSHINGS;  Surgeon: Shelah Lamar RAMAN, MD;  Location: [facility] ENDOSCOPY;  Service: Pulmonary;;   BRONCHIAL NEEDLE ASPIRATION BIOPSY  08/20/2019   Procedure: BRONCHIAL NEEDLE ASPIRATION BIOPSIES;  Surgeon: Shelah Lamar RAMAN, MD;  Location: [facility] ENDOSCOPY;  Service: Pulmonary;;   CARPAL TUNNEL RELEASE Right 01/09/2019   Procedure: CARPAL TUNNEL RELEASE;  Surgeon: Jerri Moody  M, MD;  Location: [facility];  Service: Orthopedics;  Laterality: Right;   CARPAL TUNNEL WITH CUBITAL TUNNEL Right 11/18/2020   Procedure: CARPAL TUNNEL WITH CUBITAL TUNNEL;  Surgeon: Sissy Cough, MD;  Location: [facility] OR;  Service: Orthopedics;  Laterality: Right;   CATARACT EXTRACTION Right 2018   COLONOSCOPY  2007   Dr. Rollin   DIRECT LARYNGOSCOPY N/A 04/10/2020   Procedure: DIRECT LARYNGOSCOPY;  Surgeon: Carlie Clark, MD;  Location: [facility] OR;  Service: ENT;  Laterality: N/A;   I & D EXTREMITY Right 04/02/2016   Procedure: IRRIGATION AND DEBRIDEMENT GREAT TOE;  Surgeon: Kay CHRISTELLA Cummins, MD;  Location: [facility] OR;  Service: Orthopedics;  Laterality: Right;   IR IMAGING GUIDED PORT INSERTION  08/30/2019   MULTIPLE TOOTH EXTRACTIONS     RADIOACTIVE SEED IMPLANT  2003   TRACHEOSTOMY TUBE PLACEMENT N/A 04/10/2020   Procedure: TRACHEOSTOMY;  Surgeon: Carlie Clark, MD;  Location: [facility] OR;  Service: ENT;  Laterality: N/A;   ULNAR TUNNEL RELEASE Right 01/09/2019   Procedure: RIGHT CUBITAL TUNNEL RELEASE AND CARPAL TUNNEL RELEASE;  Surgeon: Cummins Kay CHRISTELLA, MD;  Location: [facility];  Service: Orthopedics;  Laterality: Right;   UPPER GASTROINTESTINAL ENDOSCOPY     VIDEO BRONCHOSCOPY N/A 12/18/2019   Procedure: VIDEO BRONCHOSCOPY WITHOUT FLUORO;  Surgeon: Shelah Lamar RAMAN, MD;  Location: [facility] ENDOSCOPY;  Service: Cardiopulmonary;  Laterality: N/A;   VIDEO BRONCHOSCOPY WITH ENDOBRONCHIAL ULTRASOUND N/A 08/20/2019   Procedure: VIDEO BRONCHOSCOPY WITH ENDOBRONCHIAL ULTRASOUND;  Surgeon: Shelah Lamar RAMAN, MD;  Location: [facility] ENDOSCOPY;  Service: Pulmonary;  Laterality: N/A;     (Not in a hospital admission)   Inpatient Medications:   enoxaparin  (LOVENOX ) injection  40 mg Subcutaneous Q24H   pantoprazole   40 mg Oral Daily    Allergies: No Known Allergies  Family History  Problem Relation Age of Onset   Heart failure Mother    Diabetes Mother    Stroke Father    Diabetes Father    Colon cancer Sister    Diabetes Sister    Diabetes Sister    Lung cancer Sister 33   Esophageal cancer Neg Hx    Rectal cancer Neg Hx    Colon polyps Neg Hx    Stomach cancer Neg Hx      Physical Exam: Vitals:   10/31/23 0700 10/31/23 0745 10/31/23 0759 10/31/23 0816  BP: (!) 134/118 (!) 122/52    Pulse: 62 67  72  Resp: (!) 22 14  18   Temp: 97.9 F (36.6 C)     TempSrc: Oral     SpO2: 100% 97%    Weight:   84.6 kg    Height:   5' 8 (1.727 m)     GEN- NAD, A&O x 3, normal affect HEENT: Normocephalic, atraumatic Lungs- Chronic trach. Left lower lobe diminished. Heart- Regular rate and rhythm, No M/G/R.  GI- Soft, NT, ND.  Extremities- No clubbing, cyanosis, or edema   Radiology/Studies: CT Angio Chest PE W and/or Wo Contrast Result Date: 10/30/2023 EXAM: CTA CHEST PE WITHOUT AND WITH CONTRAST CT ABDOMEN AND PELVIS WITHOUT AND WITH CONTRAST 10/30/2023 10:15:48 PM TECHNIQUE: CTA of the chest was performed after the administration of intravenous contrast. Multiplanar reformatted images are provided for review. MIP images are provided for review. CT of the abdomen and pelvis was performed with the administration of intravenous contrast. Automated exposure control, iterative reconstruction, and/or weight based adjustment of the mA/kV was utilized to reduce the radiation dose to  as low as reasonably achievable. COMPARISON: CT chest abdomen and pelvis dated 06/22/2023. CLINICAL HISTORY: Rule out PE. Chest pain, dizziness, sepsis workup. N/V. eval for evidence of obstruction/ileus. FINDINGS: CHEST: PULMONARY ARTERIES: Pulmonary arteries are adequately opacified for evaluation. No intraluminal filling defect to suggest pulmonary embolism. Main pulmonary artery is normal in caliber. MEDIASTINUM: No mediastinal lymphadenopathy. The heart and pericardium demonstrate no acute abnormality. There is no acute abnormality of the thoracic aorta. Thoracic aortic atherosclerosis. LUNGS AND PLEURA: 6 mm subpleural left lower lobe nodule (image 67), previously 7 mm, likely benign. Additional peribronchovascular nodularity at the posterior left lung base (image 94), chronic. Radiation changes in the medial left upper lobe/perihilar region. No focal consolidation or pulmonary edema. No pleural effusion or pneumothorax. SOFT TISSUES AND BONES: No acute bone or soft tissue abnormality. Right chest port terminates at the cavoatrial junction.  ABDOMEN AND PELVIS: LIVER: Unremarkable. GALLBLADDER AND BILE DUCTS: Gallbladder is unremarkable. No biliary ductal dilatation. SPLEEN: Spleen demonstrates no acute abnormality. PANCREAS: Pancreas demonstrates no acute abnormality. ADRENAL GLANDS: Adrenal glands demonstrate no acute abnormality. KIDNEYS, URETERS AND BLADDER: No stones in the kidneys or ureters. No hydronephrosis. No perinephric or periureteral stranding. Urinary bladder is unremarkable. GI AND BOWEL: Mildly thick-walled loops of small bowel in the left mid abdomen with mild mesenteric stranding (image 44), raising concern for infectious/inflammatory enteritis. No pneumatosis or free air. Sigmoid diverticulosis, without evidence of diverticulitis. Normal appendix (image 48). REPRODUCTIVE: Brachytherapy seeds along the prostate. PERITONEUM AND RETROPERITONEUM: Small volume abdominopelvic ascites, predominantly perisplenic. LYMPH NODES: No lymphadenopathy. BONES AND SOFT TISSUES: No acute abnormality of the visualized bones. Tiny fat containing left inguinal hernia. Atherosclerotic calcifications of the abdominal aorta and branch vessels, although patent. IMPRESSION: 1. No evidence of pulmonary embolism. 2. Mild small bowel wall thickening with mesenteric stranding in the left mid abdomen, suggesting infectious/inflammatory enteritis. No pneumatosis or free air. 3. Radiation changes in the left hemithorax. No findings suspicious for recurrent or metastatic disease. 4. Additional ancillary findings as above. Electronically signed by: Pinkie Pebbles MD 10/30/2023 10:32 PM EDT RP Workstation: [id number]   CT ABDOMEN PELVIS W CONTRAST Result Date: 10/30/2023 EXAM: CTA CHEST PE WITHOUT AND WITH CONTRAST CT ABDOMEN AND PELVIS WITHOUT AND WITH CONTRAST 10/30/2023 10:15:48 PM TECHNIQUE: CTA of the chest was performed after the administration of intravenous contrast. Multiplanar reformatted images are provided for review. MIP images are provided for  review. CT of the abdomen and pelvis was performed with the administration of intravenous contrast. Automated exposure control, iterative reconstruction, and/or weight based adjustment of the mA/kV was utilized to reduce the radiation dose to as low as reasonably achievable. COMPARISON: CT chest abdomen and pelvis dated 06/22/2023. CLINICAL HISTORY: Rule out PE. Chest pain, dizziness, sepsis workup. N/V. eval for evidence of obstruction/ileus. FINDINGS: CHEST: PULMONARY ARTERIES: Pulmonary arteries are adequately opacified for evaluation. No intraluminal filling defect to suggest pulmonary embolism. Main pulmonary artery is normal in caliber. MEDIASTINUM: No mediastinal lymphadenopathy. The heart and pericardium demonstrate no acute abnormality. There is no acute abnormality of the thoracic aorta. Thoracic aortic atherosclerosis. LUNGS AND PLEURA: 6 mm subpleural left lower lobe nodule (image 67), previously 7 mm, likely benign. Additional peribronchovascular nodularity at the posterior left lung base (image 94), chronic. Radiation changes in the medial left upper lobe/perihilar region. No focal consolidation or pulmonary edema. No pleural effusion or pneumothorax. SOFT TISSUES AND BONES: No acute bone or soft tissue abnormality. Right chest port terminates at the cavoatrial junction. ABDOMEN AND PELVIS: LIVER: Unremarkable. GALLBLADDER AND  BILE DUCTS: Gallbladder is unremarkable. No biliary ductal dilatation. SPLEEN: Spleen demonstrates no acute abnormality. PANCREAS: Pancreas demonstrates no acute abnormality. ADRENAL GLANDS: Adrenal glands demonstrate no acute abnormality. KIDNEYS, URETERS AND BLADDER: No stones in the kidneys or ureters. No hydronephrosis. No perinephric or periureteral stranding. Urinary bladder is unremarkable. GI AND BOWEL: Mildly thick-walled loops of small bowel in the left mid abdomen with mild mesenteric stranding (image 44), raising concern for infectious/inflammatory enteritis. No  pneumatosis or free air. Sigmoid diverticulosis, without evidence of diverticulitis. Normal appendix (image 48). REPRODUCTIVE: Brachytherapy seeds along the prostate. PERITONEUM AND RETROPERITONEUM: Small volume abdominopelvic ascites, predominantly perisplenic. LYMPH NODES: No lymphadenopathy. BONES AND SOFT TISSUES: No acute abnormality of the visualized bones. Tiny fat containing left inguinal hernia. Atherosclerotic calcifications of the abdominal aorta and branch vessels, although patent. IMPRESSION: 1. No evidence of pulmonary embolism. 2. Mild small bowel wall thickening with mesenteric stranding in the left mid abdomen, suggesting infectious/inflammatory enteritis. No pneumatosis or free air. 3. Radiation changes in the left hemithorax. No findings suspicious for recurrent or metastatic disease. 4. Additional ancillary findings as above. Electronically signed by: Pinkie Pebbles MD 10/30/2023 10:32 PM EDT RP Workstation: [id number]   DG Chest Port 1 View Result Date: 10/30/2023 CLINICAL DATA:  Chest pain and dizziness. Central chest pain radiating to both sides. EXAM: PORTABLE CHEST 1 VIEW COMPARISON:  CT chest 06/22/2023 FINDINGS: Tracheostomy tube with tip measuring 4.7 cm above the carina. Power port type central venous catheter with tip over the cavoatrial junction region. No pneumothorax. Shallow inspiration. Mild cardiac enlargement. No vascular congestion or edema. Mild left perihilar and basilar infiltration may represent pneumonia or atelectasis. Probable small left pleural effusion. Calcification of the aorta. Degenerative changes in the spine and shoulders. IMPRESSION: Probable small left pleural effusion with infiltration or atelectasis in the left mid and lower lung zone, likely pneumonia. Electronically Signed   By: Elsie Gravely M.D.   On: 10/30/2023 19:17    ZXH:dpwld rhythm, rate 77 bpm with first degree AVB (personally reviewed)  TELEMETRY: sinus rhythm with first degree AVB.  Intermittent blocked PACs seen with isolated nonconducted P wave. (personally reviewed)  DEVICE HISTORY: N/A  Assessment/Plan:  Syncope Transient heart block? Patient admitted following episode of syncope at home. No prodrome. Of note, chart review shows prior brief 2:1 heart block in 2022 (in setting of new cancer diagnosis). These strips also reflect vagal response. This admission lab and imaging workup largely benign with empiric abx given due to chronic trach with infiltrates on CXR. I am unable to see reported episode of CHB this admission due to unlabeled telemetry. Available telemetry shows first degree AVB with intermittent blocked PACs, one non-conducted P wave. No high grade AVB. Given ongoing hypotension upon arrival to the ED, prior evidence of vagally mediated bradycardia, and GI upset with CT observation of mesenteric stranding, this episode of syncope could have vagal etiology. Recommend heart monitor upon discharge for longitudinal rhythm monitoring. If patient found to have high grade AVB/indication for PPM, given his co-morbidities, would need to consider leadless device.    EP to see as needed. Please call back if high grade AVB seen.   For questions or updates, please contact [facility] Please consult [email or web address] for contact info under     Signed, Artist Pouch, PA-C  10/31/2023, 8:41 AM      Shaun Kennedy was seen by me today along with Artist Pouch. I have personally performed an evaluation on this patient.  My findings are  as follows: 83 y.o. male patient with a past medical history as above.  He presented to the emergency room after an episode of near syncope while at home.  He was playing cards with his spouse and suddenly became very lightheaded.  The patient states that he did not lose consciousness.  Per notes, he was unresponsive for 5 to 10 seconds indicating actual syncope.  After the episode, he was awake but felt lightheaded for several  minutes with reported nausea and emesis.  He denies feeling poorly prior to the episode.  He denies coughing spells or nausea prior to his episode.  On arrival to the emergency room, he received IV fluids.  Per report, the patient had a second episode while in the emergency room.  Unfortunately telemetry strips are not available.  There is report of heart block during the episode.  He apparently had emesis around this time as well.  Thus far in the emergency room, workup has been unrevealing.  He is on antibiotics for possible aspiration..  Data: EKG(s) and pertinent labs, studies, etc were personally reviewed and interpreted by me:  EKG, telemetry Otherwise, I agree with data as outlined by the advanced practice provider.  Exam performed by me: Gen: No acute distress Neck: Tracheostomy in place Cardiac: Regular rhythm Lungs: Normal work of breathing Extremities: No edema  My Assessment and Plan: 1.  Syncope: Has reports of transient heart block.  Unfortunately telemetry strips are not available.  He did have a brief episode of tootle with AV block in 2022.  For now, would continue monitoring on telemetry.  If he does have episodes of heart block and associated syncope or near syncope, would require evaluation for pacemaker implant.  By history, this is more likely a vagal episode.  Hopefully pacemaker implant can be avoided due to his tracheostomy and left chest port.  She does not have bradycardia while in the hospital, would plan for monitor at discharge.  Signed,  Peola Joynt Gladis Norton, MD  10/31/2023 12:51 PM

## 2023-10-31 NOTE — Progress Notes (Signed)
   Overnight consult note reviewed- agree with assessment and recommendations. Noted pre-syncope with short episode of complete heart block in the ER. He is on no AVN blocking agents. Will ask EP to evaluate today as to whether he will need a pacemaker or continued monitoring. I reviewed old telemetry records under CV strips from 2022, which also showed an episode of complete heart block at that time. 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  Shaun KYM Maxcy, MD, [place], FNLA, FACP  [place]  [facility]  Medical Director of the [facility] Lipid [facility]  Cardiovascular Risk Reduction Clinic Diplomate of the American Board of Clinical Lipidology Attending Cardiologist  Direct Dial: [phone]  Fax: [phone]  Website:  [email or web address]

## 2023-10-31 NOTE — H&P (Addendum)
 History and Physical    Shaun Kennedy FMW:[id number] DOB: October 08, 1940 DOA: 10/30/2023  DOS: the patient was seen and examined on 10/30/2023  PCP: [name], MD   Patient coming from: Home  I have personally briefly reviewed patient's old medical records in [facility] Link and CareEverywhere  HPI:  Shaun Kennedy is a 83 y.o. year old male with past medical history of glottic stenosis s/p tracheostomy, HTN, HLD, CLD-III, Type II Diabetes mellitus, prior NSCLC, squamous cell carcinoma, and carotid stenosis.  He presents to [facility] ED after syncopal event at home where he was playing cards with his family and became unresponsive for 5 to 10 seconds where his head dropped down and he did not respond to family members.  After regaining consciousness patient reported that he did not feel well and did have an episode of nausea and vomited once.  He denies any prodromal symptoms.  He endorses palpitations, weakness, dizziness, lightheadedness, chest pressure, nausea, and 1 episode of emesis after syncopal episode.  He denies diaphoresis, confusion, or focal neurological deficits.   ED Course: On arrival to [facility] ED patient was noted to be afebrile temp 36.8 C, BP 122/64, HR 72, RR 16, SpO2 96% on room air.  CXR obtained and shows left mid and lower lung zone atelectasis; CTA PE + CT abdomen pelvis obtained and shows CT chest abdomen pelvisobtained and shows small bowel wall thickening with mesenteric stranding consistent with enteritis, no evidence of PE.  Labs notable for leukocytosis WBC 12.0, creatinine 1.71, potassium 3.6, magnesium  1.8, albumin 3.1, D-dimer greater than 20, hemoglobin 12.1.  He was given Zosyn , 2 L normal saline bolus, 40 mEq K and started on continuous normal saline at 75 mL/h.  While in ED patient was noted to be in third-degree heart block and EDP consulted on-call cardiology fellow, cardiology will see patient in consultation. TR[facility] contacted for admission.  Review  of Systems: As mentioned in the history of present illness. All other systems reviewed and are negative.  Review of Systems  Constitutional:  Positive for malaise/fatigue. Negative for chills and fever.  HENT:  Negative for congestion.   Eyes:  Negative for blurred vision.  Respiratory:  Negative for cough, sputum production, shortness of breath and wheezing.   Cardiovascular:  Positive for chest pain and palpitations.  Gastrointestinal:  Positive for nausea and vomiting. Negative for abdominal pain, constipation, diarrhea and heartburn.  Genitourinary:  Negative for dysuria, frequency and urgency.  Neurological:  Positive for loss of consciousness and weakness. Negative for dizziness and focal weakness.    Past Medical History:  Diagnosis Date   Allergic rhinitis    Asthma    Carotid stenosis    Colon cancer (HCC) 2003   Diabetes mellitus (HCC)    Diverticulosis    Dyslipidemia    ED (erectile dysfunction)    GERD (gastroesophageal reflux disease)    H/O degenerative disc disease    Hemorrhoids    HTN (hypertension)    as a child   Hyperlipidemia    Lung cancer (HCC)    LVH (left ventricular hypertrophy)    on EKG   Mass of lower lobe of left lung    Mediastinal adenopathy    Smoker    former   Wears dentures    Wears dentures    Wears glasses    Wears glasses     Past Surgical History:  Procedure Laterality Date   BRONCHIAL BIOPSY  08/20/2019  Procedure: BRONCHIAL BIOPSIES;  Surgeon: Shelah Lamar RAMAN, MD;  Location: [facility] ENDOSCOPY;  Service: Pulmonary;;   BRONCHIAL BRUSHINGS  08/20/2019   Procedure: BRONCHIAL BRUSHINGS;  Surgeon: Shelah Lamar RAMAN, MD;  Location: [facility] ENDOSCOPY;  Service: Pulmonary;;   BRONCHIAL NEEDLE ASPIRATION BIOPSY  08/20/2019   Procedure: BRONCHIAL NEEDLE ASPIRATION BIOPSIES;  Surgeon: Shelah Lamar RAMAN, MD;  Location: [facility] ENDOSCOPY;  Service: Pulmonary;;   CARPAL TUNNEL RELEASE Right 01/09/2019   Procedure: CARPAL TUNNEL RELEASE;  Surgeon: Jerri Kay HERO, MD;  Location: [facility];  Service: Orthopedics;  Laterality: Right;   CARPAL TUNNEL WITH CUBITAL TUNNEL Right 11/18/2020   Procedure: CARPAL TUNNEL WITH CUBITAL TUNNEL;  Surgeon: Sissy Cough, MD;  Location: [facility] OR;  Service: Orthopedics;  Laterality: Right;   CATARACT EXTRACTION Right 2018   COLONOSCOPY  2007   Dr. Rollin   DIRECT LARYNGOSCOPY N/A 04/10/2020   Procedure: DIRECT LARYNGOSCOPY;  Surgeon: Carlie Clark, MD;  [facility];  Service: ENT;  Laterality: N/A;   I & D EXTREMITY Right 04/02/2016   Procedure: IRRIGATION AND DEBRIDEMENT GREAT TOE;  Surgeon: Kay HERO Jerri, MD;  Location: [facility] OR;  Service: Orthopedics;  Laterality: Right;   IR IMAGING GUIDED PORT INSERTION  08/30/2019   MULTIPLE TOOTH EXTRACTIONS     RADIOACTIVE SEED IMPLANT  2003   TRACHEOSTOMY TUBE PLACEMENT N/A 04/10/2020   Procedure: TRACHEOSTOMY;  Surgeon: Carlie Clark, MD;  Location: [facility] OR;  Service: ENT;  Laterality: N/A;   ULNAR TUNNEL RELEASE Right 01/09/2019   Procedure: RIGHT CUBITAL TUNNEL RELEASE AND CARPAL TUNNEL RELEASE;  Surgeon: Jerri Kay HERO, MD;  Location:  SURGERY CENTER;  Service: Orthopedics;  Laterality: Right;   UPPER GASTROINTESTINAL ENDOSCOPY     VIDEO BRONCHOSCOPY N/A 12/18/2019   Procedure: VIDEO BRONCHOSCOPY WITHOUT FLUORO;  Surgeon: Shelah Lamar RAMAN, MD;  Location: [facility] ENDOSCOPY;  Service: Cardiopulmonary;  Laterality: N/A;   VIDEO BRONCHOSCOPY WITH ENDOBRONCHIAL ULTRASOUND N/A 08/20/2019   Procedure: VIDEO BRONCHOSCOPY WITH ENDOBRONCHIAL ULTRASOUND;  Surgeon: Shelah Lamar RAMAN, MD;  Location: [facility] ENDOSCOPY;  Service: Pulmonary;  Laterality: N/A;     reports that he quit smoking about 38 years ago. His smoking use included cigarettes. He started smoking about 68 years ago. He has a 32 pack-year smoking history. He has never used smokeless tobacco. He reports that he does not drink alcohol  and does not use drugs.  No Known Allergies  Family History  Problem Relation  Age of Onset   Heart failure Mother    Diabetes Mother    Stroke Father    Diabetes Father    Colon cancer Sister    Diabetes Sister    Diabetes Sister    Lung cancer Sister 60   Esophageal cancer Neg Hx    Rectal cancer Neg Hx    Colon polyps Neg Hx    Stomach cancer Neg Hx     Prior to Admission medications   Medication Sig Start Date End Date Taking? Authorizing Provider  amLODipine  (NORVASC ) 5 MG tablet TAKE 1 TABLET (5 MG TOTAL) BY MOUTH DAILY. 06/14/23  Yes [name], MD  aspirin  EC 81 MG tablet Take 81 mg by mouth daily after breakfast.    Yes [provider]  cholecalciferol  (VITAMIN D ) 25 MCG (1000 UNIT) tablet Take 1,000 Units by mouth daily after breakfast.   Yes [provider]  donepezil  (ARICEPT ) 10 MG tablet Take 1 tablet (10 mg total) by mouth at bedtime. 06/14/23  Yes Joyce Norleen  C, MD  ferrous sulfate  325 (65 FE) MG EC tablet TAKE 1 TABLET EVERY DAY WITH BREAKFAST Patient taking differently: Take 325 mg by mouth daily after breakfast. 06/14/23  Yes [name], MD  lidocaine -prilocaine  (EMLA ) cream Apply 1 Application topically as needed. 04/12/23  Yes Sherrod Sherrod, MD  lisinopril -hydrochlorothiazide  (ZESTORETIC ) 20-12.5 MG tablet Take 1 tablet by mouth daily. 06/14/23  Yes [name], MD  metFORMIN  (GLUCOPHAGE -XR) 500 MG 24 hr tablet Take 1 tablet (500 mg total) by mouth daily. Patient taking differently: Take 500 mg by mouth daily after breakfast. 06/14/23  Yes Lalonde, John C, MD  Multiple Vitamin (MULTIVITAMIN WITH MINERALS) TABS tablet Take 1 tablet by mouth daily after breakfast.    Yes [provider]  Propylene Glycol (SYSTANE COMPLETE) 0.6 % SOLN Place 1 drop into both eyes every morning.   Yes [provider]  simvastatin  (ZOCOR ) 40 MG tablet Take 1 tablet (40 mg total) by mouth daily. 06/14/23  Yes [name], MD  solifenacin  (VESICARE ) 5 MG tablet Take 1 tablet (5 mg total) by mouth daily. 06/14/23  Yes [name], MD    Physical Exam: Vitals:   10/31/23 0759 10/31/23 0816 10/31/23 1119 10/31/23 1148  BP:   (!) 102/57   Pulse:  72 (!) 58 (!) 58  Resp:  18 (!) 23 19  Temp:   98.4 F (36.9 C)   TempSrc:   Oral   SpO2:   100%   Weight: 84.6 kg     Height: 5' 8 (1.727 m)       Physical Exam Vitals reviewed.  Constitutional:      Appearance: He is well-developed.  HENT:     Head: Normocephalic.  Eyes:     Pupils: Pupils are equal, round, and reactive to light.  Cardiovascular:     Rate and Rhythm: Normal rate and regular rhythm.     Pulses:          Radial pulses are 2+ on the right side and 2+ on the left side.       Posterior tibial pulses are 2+ on the right side and 2+ on the left side.     Heart sounds: Normal heart sounds.     Comments: No cartoid bruit heard Pulmonary:     Effort: Pulmonary effort is normal. No respiratory distress.     Breath sounds: Examination of the left-upper field reveals decreased breath sounds. Examination of the left-middle field reveals decreased breath sounds. Examination of the left-lower field reveals decreased breath sounds. Decreased breath sounds present.  Abdominal:     Palpations: Abdomen is soft.     Tenderness: There is no abdominal tenderness.  Musculoskeletal:     Right lower leg: No edema.     Left lower leg: No edema.  Skin:    General: Skin is warm and dry.     Capillary Refill: Capillary refill takes less than 2 seconds.  Neurological:     General: No focal deficit present.     Mental Status: He is alert and oriented to person, place, and time.     Motor: Weakness present.     Labs on Admission: I have personally reviewed following labs and imaging studies  CBC: Recent Labs  Lab 10/30/23 1829 10/30/23 1951 10/31/23 0453  WBC 12.0*  --  8.8  NEUTROABS  --   --  6.9  HGB 12.1* 12.6* 10.0*  HCT 37.5* 37.0* 30.2*  MCV 96.2  --  95.3  PLT 335  --  287   Basic Metabolic Panel: Recent Labs  Lab 10/30/23 1829  10/30/23 1926 10/30/23 1951 10/31/23 0453  NA 137  --  140 136  K 3.6  --  3.7 3.7  CL 104  --  107 108  CO2 20*  --   --  21*  GLUCOSE 99  --  103* 101*  BUN 23  --  25* 18  CREATININE 1.71*  --  1.80* 1.45*  CALCIUM 8.8*  --   --  8.0*  MG  --  1.8  --  1.6*   GFR: Estimated Creatinine Clearance: 39.5 mL/min (A) (by C-G formula based on SCr of 1.45 mg/dL (H)). Liver Function Tests: Recent Labs  Lab 10/30/23 1829 10/31/23 0453  AST 20 15  ALT 13 12  ALKPHOS 45 40  BILITOT 1.2 0.9  PROT 7.4 6.4*  ALBUMIN 3.1* 2.7*   Recent Labs  Lab 10/30/23 1829  LIPASE 34   No results for input(s): AMMONIA in the last 168 hours. Coagulation Profile: No results for input(s): INR, PROTIME in the last 168 hours. Cardiac Enzymes: Recent Labs  Lab 10/30/23 1829 10/30/23 2120 10/31/23 1000  TROPONINIHS 8 12 10    BNP (last 3 results) Recent Labs    10/31/23 0453  BNP 69.2   HbA1C: No results for input(s): HGBA1C in the last 72 hours. CBG: No results for input(s): GLUCAP in the last 168 hours. Lipid Profile: No results for input(s): CHOL, HDL, LDLCALC, TRIG, CHOLHDL, LDLDIRECT in the last 72 hours. Thyroid  Function Tests: Recent Labs    10/30/23 1939  TSH 2.196   Anemia Panel: No results for input(s): VITAMINB12, FOLATE, FERRITIN, TIBC, IRON, RETICCTPCT in the last 72 hours. Urine analysis:    Component Value Date/Time   COLORURINE YELLOW 04/20/2020 0255   APPEARANCEUR HAZY (A) 04/20/2020 0255   LABSPEC 1.018 04/20/2020 0255   LABSPEC 1.010 09/18/2019 1406   PHURINE 5.0 04/20/2020 0255   GLUCOSEU NEGATIVE 04/20/2020 0255   HGBUR NEGATIVE 04/20/2020 0255   BILIRUBINUR NEGATIVE 04/20/2020 0255   BILIRUBINUR negative 09/18/2019 1406   BILIRUBINUR n 06/13/2011 0931   KETONESUR 5 (A) 04/20/2020 0255   PROTEINUR NEGATIVE 04/20/2020 0255   UROBILINOGEN negative 06/13/2011 0931   NITRITE NEGATIVE 04/20/2020 0255   LEUKOCYTESUR  NEGATIVE 04/20/2020 0255    Radiological Exams on Admission: I have personally reviewed images CT Angio Chest PE W and/or Wo Contrast Result Date: 10/30/2023 EXAM: CTA CHEST PE WITHOUT AND WITH CONTRAST CT ABDOMEN AND PELVIS WITHOUT AND WITH CONTRAST 10/30/2023 10:15:48 PM TECHNIQUE: CTA of the chest was performed after the administration of intravenous contrast. Multiplanar reformatted images are provided for review. MIP images are provided for review. CT of the abdomen and pelvis was performed with the administration of intravenous contrast. Automated exposure control, iterative reconstruction, and/or weight based adjustment of the mA/kV was utilized to reduce the radiation dose to as low as reasonably achievable. COMPARISON: CT chest abdomen and pelvis dated 06/22/2023. CLINICAL HISTORY: Rule out PE. Chest pain, dizziness, sepsis workup. N/V. eval for evidence of obstruction/ileus. FINDINGS: CHEST: PULMONARY ARTERIES: Pulmonary arteries are adequately opacified for evaluation. No intraluminal filling defect to suggest pulmonary embolism. Main pulmonary artery is normal in caliber. MEDIASTINUM: No mediastinal lymphadenopathy. The heart and pericardium demonstrate no acute abnormality. There is no acute abnormality of the thoracic aorta. Thoracic aortic atherosclerosis. LUNGS AND PLEURA: 6 mm subpleural left lower lobe nodule (image 67), previously 7 mm, likely benign. Additional  peribronchovascular nodularity at the posterior left lung base (image 94), chronic. Radiation changes in the medial left upper lobe/perihilar region. No focal consolidation or pulmonary edema. No pleural effusion or pneumothorax. SOFT TISSUES AND BONES: No acute bone or soft tissue abnormality. Right chest port terminates at the cavoatrial junction. ABDOMEN AND PELVIS: LIVER: Unremarkable. GALLBLADDER AND BILE DUCTS: Gallbladder is unremarkable. No biliary ductal dilatation. SPLEEN: Spleen demonstrates no acute abnormality. PANCREAS:  Pancreas demonstrates no acute abnormality. ADRENAL GLANDS: Adrenal glands demonstrate no acute abnormality. KIDNEYS, URETERS AND BLADDER: No stones in the kidneys or ureters. No hydronephrosis. No perinephric or periureteral stranding. Urinary bladder is unremarkable. GI AND BOWEL: Mildly thick-walled loops of small bowel in the left mid abdomen with mild mesenteric stranding (image 44), raising concern for infectious/inflammatory enteritis. No pneumatosis or free air. Sigmoid diverticulosis, without evidence of diverticulitis. Normal appendix (image 48). REPRODUCTIVE: Brachytherapy seeds along the prostate. PERITONEUM AND RETROPERITONEUM: Small volume abdominopelvic ascites, predominantly perisplenic. LYMPH NODES: No lymphadenopathy. BONES AND SOFT TISSUES: No acute abnormality of the visualized bones. Tiny fat containing left inguinal hernia. Atherosclerotic calcifications of the abdominal aorta and branch vessels, although patent. IMPRESSION: 1. No evidence of pulmonary embolism. 2. Mild small bowel wall thickening with mesenteric stranding in the left mid abdomen, suggesting infectious/inflammatory enteritis. No pneumatosis or free air. 3. Radiation changes in the left hemithorax. No findings suspicious for recurrent or metastatic disease. 4. Additional ancillary findings as above. Electronically signed by: Pinkie Pebbles MD 10/30/2023 10:32 PM EDT RP Workstation: [id number]   CT ABDOMEN PELVIS W CONTRAST Result Date: 10/30/2023 EXAM: CTA CHEST PE WITHOUT AND WITH CONTRAST CT ABDOMEN AND PELVIS WITHOUT AND WITH CONTRAST 10/30/2023 10:15:48 PM TECHNIQUE: CTA of the chest was performed after the administration of intravenous contrast. Multiplanar reformatted images are provided for review. MIP images are provided for review. CT of the abdomen and pelvis was performed with the administration of intravenous contrast. Automated exposure control, iterative reconstruction, and/or weight based adjustment of the  mA/kV was utilized to reduce the radiation dose to as low as reasonably achievable. COMPARISON: CT chest abdomen and pelvis dated 06/22/2023. CLINICAL HISTORY: Rule out PE. Chest pain, dizziness, sepsis workup. N/V. eval for evidence of obstruction/ileus. FINDINGS: CHEST: PULMONARY ARTERIES: Pulmonary arteries are adequately opacified for evaluation. No intraluminal filling defect to suggest pulmonary embolism. Main pulmonary artery is normal in caliber. MEDIASTINUM: No mediastinal lymphadenopathy. The heart and pericardium demonstrate no acute abnormality. There is no acute abnormality of the thoracic aorta. Thoracic aortic atherosclerosis. LUNGS AND PLEURA: 6 mm subpleural left lower lobe nodule (image 67), previously 7 mm, likely benign. Additional peribronchovascular nodularity at the posterior left lung base (image 94), chronic. Radiation changes in the medial left upper lobe/perihilar region. No focal consolidation or pulmonary edema. No pleural effusion or pneumothorax. SOFT TISSUES AND BONES: No acute bone or soft tissue abnormality. Right chest port terminates at the cavoatrial junction. ABDOMEN AND PELVIS: LIVER: Unremarkable. GALLBLADDER AND BILE DUCTS: Gallbladder is unremarkable. No biliary ductal dilatation. SPLEEN: Spleen demonstrates no acute abnormality. PANCREAS: Pancreas demonstrates no acute abnormality. ADRENAL GLANDS: Adrenal glands demonstrate no acute abnormality. KIDNEYS, URETERS AND BLADDER: No stones in the kidneys or ureters. No hydronephrosis. No perinephric or periureteral stranding. Urinary bladder is unremarkable. GI AND BOWEL: Mildly thick-walled loops of small bowel in the left mid abdomen with mild mesenteric stranding (image 44), raising concern for infectious/inflammatory enteritis. No pneumatosis or free air. Sigmoid diverticulosis, without evidence of diverticulitis. Normal appendix (image 48). REPRODUCTIVE: Brachytherapy seeds along the prostate.  PERITONEUM AND RETROPERITONEUM:  Small volume abdominopelvic ascites, predominantly perisplenic. LYMPH NODES: No lymphadenopathy. BONES AND SOFT TISSUES: No acute abnormality of the visualized bones. Tiny fat containing left inguinal hernia. Atherosclerotic calcifications of the abdominal aorta and branch vessels, although patent. IMPRESSION: 1. No evidence of pulmonary embolism. 2. Mild small bowel wall thickening with mesenteric stranding in the left mid abdomen, suggesting infectious/inflammatory enteritis. No pneumatosis or free air. 3. Radiation changes in the left hemithorax. No findings suspicious for recurrent or metastatic disease. 4. Additional ancillary findings as above. Electronically signed by: Pinkie Pebbles MD 10/30/2023 10:32 PM EDT RP Workstation: [id number]   DG Chest Port 1 View Result Date: 10/30/2023 CLINICAL DATA:  Chest pain and dizziness. Central chest pain radiating to both sides. EXAM: PORTABLE CHEST 1 VIEW COMPARISON:  CT chest 06/22/2023 FINDINGS: Tracheostomy tube with tip measuring 4.7 cm above the carina. Power port type central venous catheter with tip over the cavoatrial junction region. No pneumothorax. Shallow inspiration. Mild cardiac enlargement. No vascular congestion or edema. Mild left perihilar and basilar infiltration may represent pneumonia or atelectasis. Probable small left pleural effusion. Calcification of the aorta. Degenerative changes in the spine and shoulders. IMPRESSION: Probable small left pleural effusion with infiltration or atelectasis in the left mid and lower lung zone, likely pneumonia. Electronically Signed   By: Elsie Gravely M.D.   On: 10/30/2023 19:17    EKG: My personal interpretation of EKG shows: NSR HR 77 with first degree AVB    Assessment/Plan Principal Problem:   Syncope   ##Syncope ##?Third Degree Heart Block Events: Patient noted to be unresponsive for 5 to 10 seconds with his head bent forward while seated at a table playing cards with his  wife Endorses:  palpitations, weakness, dizziness, lightheadedness chest pressure, nausea, 1 episode of emesis after syncopal episode Neuro findings: No focal deficits noted on exam - Orthostatic vital signs  - ECHO - Cardiac Monitoring - Avoid AV nodal blocking agents - Troponin 12-->10 - Neuro checks - IVF: NS 75 mL/hr - PT/OT eval and treat - Cardiology consulted, appreciate their recommendations and management  ##Community Acquired Pneumonia - Zosyn  started in ED, continue - Mucinex  - Pulmonary Toilet - Supportive care  ##Acute Kidney Injury on chronic kidney disease stage III - IVF hydration - Avoid nephrotoxins, contrast Dyes, Hypotension and Dehydration   #Type II Diabetes Mellitus On metformin  outpatient; A1c 6.3% ~37months ago - AC/HS CBG monitoring and SSI while inpatient  #Hypertension - Hold home BP meds in setting of soft BPs  #Hyperlipidemia -Continue home simvastatin   VTE prophylaxis:  Lovenox   GI prophylaxis: Protonix  Diet: Heart healthy/carb modified Access: PIV Lines: None Code Status:  Full Code Telemetry: Yes  Admission status: Inpatient, Progressive Patient is from: Home Anticipated d/c is to: Home Anticipated d/c date is: 1-2 days Patient currently: Receiving IV antibiotics, awaiting ECHO, and Cardiology/EP auscultation  Family Communication: Son at bedside  Consults called: Cardiology, EDP   Severity of Illness: The appropriate patient status for this patient is OBSERVATION. Observation status is judged to be reasonable and necessary in order to provide the required intensity of service to ensure the patient's safety. The patient's presenting symptoms, physical exam findings, and initial radiographic and laboratory data in the context of their medical condition is felt to place them at decreased risk for further clinical deterioration. Furthermore, it is anticipated that the patient will be medically stable for discharge from the hospital  within 2 midnights of admission.   To reach the provider On-Call:  7AM- 7PM see care teams to locate the attending and reach out to them via [email or web address] Password: [email or web address] 7PM-7AM contact night-coverage If you still have difficulty reaching the appropriate provider, please page the [facility] (Director on Call) for [facility] on amion for assistance  This document was prepared using Conservation officer, historic buildings and may include unintentional dictation errors.  Rockie Rams FNP-BC, PMHNP-BC Nurse Practitioner [facility]

## 2023-10-31 NOTE — Telephone Encounter (Signed)
 Copied from CRM #[id number]. Topic: Clinical - Medical Advice >> Oct 31, 2023  8:02 AM Everette C wrote: Reason for CRM: The patient's daughter has called to notify the practice that the patient is currently hospitalized at [facility]. Please contact further if/when possible

## 2023-11-01 ENCOUNTER — Other Ambulatory Visit: Payer: Self-pay | Admitting: Cardiology

## 2023-11-01 DIAGNOSIS — Z7984 Long term (current) use of oral hypoglycemic drugs: Secondary | ICD-10-CM | POA: Diagnosis not present

## 2023-11-01 DIAGNOSIS — R079 Chest pain, unspecified: Secondary | ICD-10-CM

## 2023-11-01 DIAGNOSIS — Z823 Family history of stroke: Secondary | ICD-10-CM | POA: Diagnosis not present

## 2023-11-01 DIAGNOSIS — Z9221 Personal history of antineoplastic chemotherapy: Secondary | ICD-10-CM | POA: Diagnosis not present

## 2023-11-01 DIAGNOSIS — N179 Acute kidney failure, unspecified: Secondary | ICD-10-CM | POA: Diagnosis present

## 2023-11-01 DIAGNOSIS — K529 Noninfective gastroenteritis and colitis, unspecified: Secondary | ICD-10-CM | POA: Diagnosis present

## 2023-11-01 DIAGNOSIS — N1831 Chronic kidney disease, stage 3a: Secondary | ICD-10-CM | POA: Diagnosis present

## 2023-11-01 DIAGNOSIS — I442 Atrioventricular block, complete: Secondary | ICD-10-CM

## 2023-11-01 DIAGNOSIS — J189 Pneumonia, unspecified organism: Secondary | ICD-10-CM | POA: Diagnosis not present

## 2023-11-01 DIAGNOSIS — Z8249 Family history of ischemic heart disease and other diseases of the circulatory system: Secondary | ICD-10-CM | POA: Diagnosis not present

## 2023-11-01 DIAGNOSIS — R55 Syncope and collapse: Secondary | ICD-10-CM | POA: Diagnosis present

## 2023-11-01 DIAGNOSIS — Z85118 Personal history of other malignant neoplasm of bronchus and lung: Secondary | ICD-10-CM | POA: Diagnosis not present

## 2023-11-01 DIAGNOSIS — Z923 Personal history of irradiation: Secondary | ICD-10-CM | POA: Diagnosis not present

## 2023-11-01 DIAGNOSIS — Z87891 Personal history of nicotine dependence: Secondary | ICD-10-CM | POA: Diagnosis not present

## 2023-11-01 DIAGNOSIS — I152 Hypertension secondary to endocrine disorders: Secondary | ICD-10-CM | POA: Diagnosis present

## 2023-11-01 DIAGNOSIS — Z800 Family history of malignant neoplasm of digestive organs: Secondary | ICD-10-CM | POA: Diagnosis not present

## 2023-11-01 DIAGNOSIS — J386 Stenosis of larynx: Secondary | ICD-10-CM | POA: Diagnosis present

## 2023-11-01 DIAGNOSIS — J690 Pneumonitis due to inhalation of food and vomit: Secondary | ICD-10-CM | POA: Diagnosis present

## 2023-11-01 DIAGNOSIS — Z7982 Long term (current) use of aspirin: Secondary | ICD-10-CM | POA: Diagnosis not present

## 2023-11-01 DIAGNOSIS — E1122 Type 2 diabetes mellitus with diabetic chronic kidney disease: Secondary | ICD-10-CM | POA: Diagnosis present

## 2023-11-01 DIAGNOSIS — E1159 Type 2 diabetes mellitus with other circulatory complications: Secondary | ICD-10-CM | POA: Diagnosis present

## 2023-11-01 DIAGNOSIS — Z930 Tracheostomy status: Secondary | ICD-10-CM | POA: Diagnosis not present

## 2023-11-01 DIAGNOSIS — E785 Hyperlipidemia, unspecified: Secondary | ICD-10-CM | POA: Diagnosis present

## 2023-11-01 DIAGNOSIS — Z801 Family history of malignant neoplasm of trachea, bronchus and lung: Secondary | ICD-10-CM | POA: Diagnosis not present

## 2023-11-01 DIAGNOSIS — Z85038 Personal history of other malignant neoplasm of large intestine: Secondary | ICD-10-CM | POA: Diagnosis not present

## 2023-11-01 DIAGNOSIS — Z833 Family history of diabetes mellitus: Secondary | ICD-10-CM | POA: Diagnosis not present

## 2023-11-01 DIAGNOSIS — E1169 Type 2 diabetes mellitus with other specified complication: Secondary | ICD-10-CM | POA: Diagnosis present

## 2023-11-01 LAB — CBC
HCT: 29.9 % — ABNORMAL LOW (ref 39.0–52.0)
Hemoglobin: 9.8 g/dL — ABNORMAL LOW (ref 13.0–17.0)
MCH: 31 pg (ref 26.0–34.0)
MCHC: 32.8 g/dL (ref 30.0–36.0)
MCV: 94.6 fL (ref 80.0–100.0)
Platelets: 289 K/uL (ref 150–400)
RBC: 3.16 MIL/uL — ABNORMAL LOW (ref 4.22–5.81)
RDW: 12.7 % (ref 11.5–15.5)
WBC: 7.4 K/uL (ref 4.0–10.5)
nRBC: 0 % (ref 0.0–0.2)

## 2023-11-01 LAB — GLUCOSE, CAPILLARY
Glucose-Capillary: 87 mg/dL (ref 70–99)
Glucose-Capillary: 89 mg/dL (ref 70–99)
Glucose-Capillary: 88 mg/dL (ref 70–99)
Glucose-Capillary: 111 mg/dL — ABNORMAL HIGH (ref 70–99)
Glucose-Capillary: 104 mg/dL — ABNORMAL HIGH (ref 70–99)

## 2023-11-01 LAB — BASIC METABOLIC PANEL WITH GFR
Anion gap: 7 (ref 5–15)
BUN: 11 mg/dL (ref 8–23)
CO2: 22 mmol/L (ref 22–32)
Calcium: 8.1 mg/dL — ABNORMAL LOW (ref 8.9–10.3)
Chloride: 108 mmol/L (ref 98–111)
Creatinine, Ser: 1.31 mg/dL — ABNORMAL HIGH (ref 0.61–1.24)
GFR, Estimated: 53 mL/min — ABNORMAL LOW (ref >60)
Glucose, Bld: 86 mg/dL (ref 70–99)
Potassium: 3.8 mmol/L (ref 3.5–5.1)
Sodium: 137 mmol/L (ref 135–145)

## 2023-11-01 LAB — MAGNESIUM: Magnesium: 1.8 mg/dL (ref 1.7–2.4)

## 2023-11-01 MED ORDER — MAGNESIUM OXIDE -MG SUPPLEMENT 400 (240 MG) MG PO TABS
400.0000 mg | ORAL_TABLET | Freq: Two times a day (BID) | ORAL | Status: AC
  Administered 2023-11-01 (×2): 400 mg via ORAL
  Filled 2023-11-01 (×2): qty 1

## 2023-11-01 NOTE — Progress Notes (Signed)
   Spoke with our EP team, after they were able to evaluate telemetry from overnight showing that episode of brief CHB was occurring during episode of emesis they believe likely in the setting of increased vagal tone. No longer necessary to have long term monitor placed.   Waddell DELENA Donath, PA-C 11/01/2023 9:44 AM

## 2023-11-01 NOTE — Progress Notes (Signed)
  Progress Note  Patient Name: Shaun Kennedy Date of Encounter: 11/01/2023 [facility] Cardiologist: Vinie JAYSON Maxcy, MD   Interval Summary   Spoke with EP team this AM as prior CV strips are available now  They agree there is brief heart block present on telemetry but confirm there is still no indication for pacing at this time Patient resting comfortably in bed, no acute complaints   Vital Signs Vitals:   11/01/23 0334 11/01/23 0700 11/01/23 0750 11/01/23 0758  BP:  (!) 120/53    Pulse:   63 64  Resp:    18  Temp:    98.3 F (36.8 C)  TempSrc:    Oral  SpO2:    98%  Weight: 84.5 kg     Height:        Intake/Output Summary (Last 24 hours) at 11/01/2023 0842 Last data filed at 11/01/2023 0810 Gross per 24 hour  Intake 1204.61 ml  Output 1175 ml  Net 29.61 ml      11/01/2023    3:34 AM 11/01/2023   12:07 AM 10/31/2023    3:37 PM  Last 3 Weights  Weight (lbs) 186 lb 4.6 oz 186 lb 4.6 oz 182 lb 3.2 oz  Weight (kg) 84.5 kg 84.5 kg 82.645 kg     Telemetry/ECG  Sinus rhythm, first degree AV block, 1-2 second pauses  - Personally Reviewed  Physical Exam  GEN: No acute distress.   Neck: chronic tracheostomy  Cardiac: RRR, no murmurs, rubs, or gallops.  Respiratory: chronic trach, normal respiratory effort. GI: Soft, nontender, non-distended  MS: No edema  Assessment & Plan   Syncope  Brief intermittent heart block Patient reported episode of syncope at home  Prodrome of lightheadedness, head then fell forward and became unresponsive for 5-10s per family Reported N/V x 1 episode post-syncopal episode  Telemetry primarily shows first degree AV block, ~1-2 second pauses We were able to review the strips from ED, it does appear that he experienced brief intermittent CHB, discussed with EP this morning and they confirmed there is no indication for pacing at this time Seen by EP team who recommended monitor at discharge  Pending AM labs  Continue to monitor on  telemetry while admitted Consider outpatient monitor at discharge if nothing presents during admission   Per primary CAP vs aspiration PNA AKI T2DM Hypertension Hyperlipidemia    For questions or updates, please contact [facility] Please consult [email or web address] for contact info under         Signed, Waddell DELENA Donath, PA-C

## 2023-11-01 NOTE — Progress Notes (Signed)
  Telemetry from evening of arrival is now available.   Pt did have brief episode of CHB that occurred during emesis.    Telemetry shows concomitant P-P interval and PR interval prolongation consistent with vagal slowing and is not an indication for a pacemaker.   Likely 2/2 increased vagal tone in setting of chronic trach.   Reviewed with Dr. Inocencio.   Ozell Jodie Passey, PA-C  11/01/2023 9:15 AM

## 2023-11-01 NOTE — Progress Notes (Signed)
 Daughter Yevonne wants update from Dr on plan of care [phone])

## 2023-11-01 NOTE — Progress Notes (Signed)
 Error

## 2023-11-01 NOTE — Progress Notes (Addendum)
 [facility] PROGRESS NOTE    Progress Note  Shaun Kennedy  FMW:[id number] DOB: 1941/01/24 DOA: 10/30/2023 PCP: [name], MD     Brief Narrative:   Shaun Kennedy is an 83 y.o. male past medical history of glottic stenosis status post tracheostomy, essential hypertension hyperlipidemia, diabetes mellitus type 2, prior NSCLC and history of squamous cell carcinoma comes into the ED for syncopal episode while playing cards with family, after that she developed nausea and vomiting denies any prodromal symptoms, developed another episode in the ED where he was reported he had a complete heart block.  EP was consulted.  He was also found to be in acute kidney injury and possible aspiration pneumonia  Assessment/Plan:   Syncope likely due to intermittent complete heart block: Apparently it was seen that he developed an complete heart block he remained asymptomatic. Lead EKG showed sinus rhythm with a first-degree block. He is on no AV nodal blocking agents. Cardiology was consulted recommended a 2D echo and monitor on telemetry. Continue keep pads on. Keep potassium greater than 4 magnesium  greater than 2. EP to see in the morning, tele shows < 2.0  pause, look  Community-acquired pneumonia versus aspiration pneumonia: White blood cell count of 12 remain afebrile. D-dimer was greater than 20 so CT angio was done that showed no PE, with radiation changes to the left hemithorax. CT scan of the abdomen pelvis showed small bowel wall thickening with stranding in the left abdomen, no pneumatosis or free air.  Acute kidney injury: Likely hemodynamically mediated. With a baseline creatinine 1.3-1.4, on admission 1.8 was started on IV fluids creatinine has improved to baseline.  Diabetes mellitus type II, non insulin  dependent (HCC) Last A1c of 6.3. Continue AC and at bedtime sliding scale insulin .  Hypertension associated with diabetes (HCC) Continue to hold all antihypertensive  medication, his blood pressure seems to be well-controlled.  Hyperlipidemia associated with type 2 diabetes mellitus (HCC) Continue statins.   DVT prophylaxis: lovenox  Family Communication:none Status is: Observation The patient remains OBS appropriate and will d/c before 2 midnights.    Code Status:     Code Status Orders  (From admission, onward)           Start     Ordered   10/31/23 0739  Full code  Continuous       Question:  By:  Answer:  Consent: discussion documented in EHR   10/31/23 0741           Code Status History     Date Active Date Inactive Code Status Order ID Comments User Context   10/30/2023 2348 10/31/2023 0741 Full Code [id number]  Marcene Eva NOVAK, DO ED   04/20/2020 0056 04/23/2020 1925 Full Code [id number]  Charlton Evalene RAMAN, MD Inpatient   04/10/2020 1509 04/15/2020 1951 Full Code [id number]  Carlie Clark, MD Inpatient   04/02/2016 2329 04/03/2016 1614 Full Code [id number]  Jerri Kay HERO, MD Inpatient         IV Access:   Peripheral IV   Procedures and diagnostic studies:   ECHOCARDIOGRAM COMPLETE Result Date: 10/31/2023    ECHOCARDIOGRAM REPORT   Patient Name:   Shaun Kennedy Date of Exam: 10/31/2023 Medical Rec #:  [id number]      Height:       68.0 in Accession #:    [id number]     Weight:       186.5 lb Date of Birth:  1940/12/18      BSA:  1.984 m Patient Age:    83 years       BP:           102/57 mmHg Patient Gender: M              HR:           54 bpm. Exam Location:  Inpatient Procedure: 2D Echo, Cardiac Doppler and Color Doppler (Both Spectral and Color            Flow Doppler were utilized during procedure). Indications:    Syncope R55  History:        Patient has no prior history of Echocardiogram examinations.                 Risk Factors:Hypertension, Diabetes and Former Smoker.  Sonographer:    Jayson Gaskins Referring Phys: [id number] JUSTIN B HOWERTER IMPRESSIONS  1. Calcified chords. Left ventricular ejection fraction, by  estimation, is 55 to 60%. The left ventricle has normal function. The left ventricle has no regional wall motion abnormalities. Left ventricular diastolic parameters were normal.  2. Right ventricular systolic function is normal. The right ventricular size is normal.  3. Left atrial size was mildly dilated.  4. The mitral valve is grossly normal. Trivial mitral valve regurgitation.  5. The aortic valve is tricuspid. Aortic valve regurgitation is not visualized. Aortic valve sclerosis is present, with no evidence of aortic valve stenosis. Comparison(s): No prior Echocardiogram. FINDINGS  Left Ventricle: Calcified chords. Left ventricular ejection fraction, by estimation, is 55 to 60%. The left ventricle has normal function. The left ventricle has no regional wall motion abnormalities. The left ventricular internal cavity size was normal  in size. There is no left ventricular hypertrophy. Left ventricular diastolic parameters were normal. Right Ventricle: The right ventricular size is normal. No increase in right ventricular wall thickness. Right ventricular systolic function is normal. Left Atrium: Left atrial size was mildly dilated. Right Atrium: Right atrial size was normal in size. Pericardium: There is no evidence of pericardial effusion. Mitral Valve: The mitral valve is grossly normal. Mild mitral annular calcification. Trivial mitral valve regurgitation. Tricuspid Valve: The tricuspid valve is grossly normal. Tricuspid valve regurgitation is trivial. Aortic Valve: The aortic valve is tricuspid. Aortic valve regurgitation is not visualized. Aortic valve sclerosis is present, with no evidence of aortic valve stenosis. Aortic valve mean gradient measures 4.0 mmHg. Aortic valve peak gradient measures 6.0  mmHg. Aortic valve area, by VTI measures 2.80 cm. Pulmonic Valve: The pulmonic valve was normal in structure. Pulmonic valve regurgitation is trivial. No evidence of pulmonic stenosis. Aorta: The aortic root is  normal in size and structure. Venous: The inferior vena cava was not well visualized. IAS/Shunts: The interatrial septum was not well visualized.  LEFT VENTRICLE PLAX 2D LVIDd:         4.70 cm   Diastology LVIDs:         2.70 cm   LV e' medial:    8.70 cm/s LV PW:         1.10 cm   LV E/e' medial:  12.8 LV IVS:        0.90 cm   LV e' lateral:   8.27 cm/s LVOT diam:     1.90 cm   LV E/e' lateral: 13.4 LV SV:         84 LV SV Index:   43 LVOT Area:     2.84 cm  RIGHT VENTRICLE RV S prime:  11.70 cm/s TAPSE (M-mode): 2.1 cm LEFT ATRIUM             Index        RIGHT ATRIUM           Index LA Vol (A2C):   41.4 ml 20.87 ml/m  RA Area:     10.30 cm LA Vol (A4C):   66.0 ml 33.27 ml/m  RA Volume:   15.90 ml  8.01 ml/m LA Biplane Vol: 55.7 ml 28.07 ml/m  AORTIC VALVE AV Area (Vmax):    2.93 cm AV Area (Vmean):   2.62 cm AV Area (VTI):     2.80 cm AV Vmax:           122.00 cm/s AV Vmean:          95.900 cm/s AV VTI:            0.302 m AV Peak Grad:      6.0 mmHg AV Mean Grad:      4.0 mmHg LVOT Vmax:         126.00 cm/s LVOT Vmean:        88.700 cm/s LVOT VTI:          0.298 m LVOT/AV VTI ratio: 0.99  AORTA Ao Root diam: 2.70 cm MITRAL VALVE MV Area (PHT): 3.26 cm     SHUNTS MV Decel Time: 233 msec     Systemic VTI:  0.30 m MV E velocity: 111.00 cm/s  Systemic Diam: 1.90 cm MV A velocity: 105.00 cm/s MV E/A ratio:  1.06 Stanly Leavens MD Electronically signed by Stanly Leavens MD Signature Date/Time: 10/31/2023/2:15:56 PM    Final    CT Angio Chest PE W and/or Wo Contrast Result Date: 10/30/2023 EXAM: CTA CHEST PE WITHOUT AND WITH CONTRAST CT ABDOMEN AND PELVIS WITHOUT AND WITH CONTRAST 10/30/2023 10:15:48 PM TECHNIQUE: CTA of the chest was performed after the administration of intravenous contrast. Multiplanar reformatted images are provided for review. MIP images are provided for review. CT of the abdomen and pelvis was performed with the administration of intravenous contrast. Automated exposure  control, iterative reconstruction, and/or weight based adjustment of the mA/kV was utilized to reduce the radiation dose to as low as reasonably achievable. COMPARISON: CT chest abdomen and pelvis dated 06/22/2023. CLINICAL HISTORY: Rule out PE. Chest pain, dizziness, sepsis workup. N/V. eval for evidence of obstruction/ileus. FINDINGS: CHEST: PULMONARY ARTERIES: Pulmonary arteries are adequately opacified for evaluation. No intraluminal filling defect to suggest pulmonary embolism. Main pulmonary artery is normal in caliber. MEDIASTINUM: No mediastinal lymphadenopathy. The heart and pericardium demonstrate no acute abnormality. There is no acute abnormality of the thoracic aorta. Thoracic aortic atherosclerosis. LUNGS AND PLEURA: 6 mm subpleural left lower lobe nodule (image 67), previously 7 mm, likely benign. Additional peribronchovascular nodularity at the posterior left lung base (image 94), chronic. Radiation changes in the medial left upper lobe/perihilar region. No focal consolidation or pulmonary edema. No pleural effusion or pneumothorax. SOFT TISSUES AND BONES: No acute bone or soft tissue abnormality. Right chest port terminates at the cavoatrial junction. ABDOMEN AND PELVIS: LIVER: Unremarkable. GALLBLADDER AND BILE DUCTS: Gallbladder is unremarkable. No biliary ductal dilatation. SPLEEN: Spleen demonstrates no acute abnormality. PANCREAS: Pancreas demonstrates no acute abnormality. ADRENAL GLANDS: Adrenal glands demonstrate no acute abnormality. KIDNEYS, URETERS AND BLADDER: No stones in the kidneys or ureters. No hydronephrosis. No perinephric or periureteral stranding. Urinary bladder is unremarkable. GI AND BOWEL: Mildly thick-walled loops of small bowel in the left mid abdomen with mild mesenteric  stranding (image 44), raising concern for infectious/inflammatory enteritis. No pneumatosis or free air. Sigmoid diverticulosis, without evidence of diverticulitis. Normal appendix (image 48).  REPRODUCTIVE: Brachytherapy seeds along the prostate. PERITONEUM AND RETROPERITONEUM: Small volume abdominopelvic ascites, predominantly perisplenic. LYMPH NODES: No lymphadenopathy. BONES AND SOFT TISSUES: No acute abnormality of the visualized bones. Tiny fat containing left inguinal hernia. Atherosclerotic calcifications of the abdominal aorta and branch vessels, although patent. IMPRESSION: 1. No evidence of pulmonary embolism. 2. Mild small bowel wall thickening with mesenteric stranding in the left mid abdomen, suggesting infectious/inflammatory enteritis. No pneumatosis or free air. 3. Radiation changes in the left hemithorax. No findings suspicious for recurrent or metastatic disease. 4. Additional ancillary findings as above. Electronically signed by: Pinkie Pebbles MD 10/30/2023 10:32 PM EDT RP Workstation: [id number]   CT ABDOMEN PELVIS W CONTRAST Result Date: 10/30/2023 EXAM: CTA CHEST PE WITHOUT AND WITH CONTRAST CT ABDOMEN AND PELVIS WITHOUT AND WITH CONTRAST 10/30/2023 10:15:48 PM TECHNIQUE: CTA of the chest was performed after the administration of intravenous contrast. Multiplanar reformatted images are provided for review. MIP images are provided for review. CT of the abdomen and pelvis was performed with the administration of intravenous contrast. Automated exposure control, iterative reconstruction, and/or weight based adjustment of the mA/kV was utilized to reduce the radiation dose to as low as reasonably achievable. COMPARISON: CT chest abdomen and pelvis dated 06/22/2023. CLINICAL HISTORY: Rule out PE. Chest pain, dizziness, sepsis workup. N/V. eval for evidence of obstruction/ileus. FINDINGS: CHEST: PULMONARY ARTERIES: Pulmonary arteries are adequately opacified for evaluation. No intraluminal filling defect to suggest pulmonary embolism. Main pulmonary artery is normal in caliber. MEDIASTINUM: No mediastinal lymphadenopathy. The heart and pericardium demonstrate no acute abnormality.  There is no acute abnormality of the thoracic aorta. Thoracic aortic atherosclerosis. LUNGS AND PLEURA: 6 mm subpleural left lower lobe nodule (image 67), previously 7 mm, likely benign. Additional peribronchovascular nodularity at the posterior left lung base (image 94), chronic. Radiation changes in the medial left upper lobe/perihilar region. No focal consolidation or pulmonary edema. No pleural effusion or pneumothorax. SOFT TISSUES AND BONES: No acute bone or soft tissue abnormality. Right chest port terminates at the cavoatrial junction. ABDOMEN AND PELVIS: LIVER: Unremarkable. GALLBLADDER AND BILE DUCTS: Gallbladder is unremarkable. No biliary ductal dilatation. SPLEEN: Spleen demonstrates no acute abnormality. PANCREAS: Pancreas demonstrates no acute abnormality. ADRENAL GLANDS: Adrenal glands demonstrate no acute abnormality. KIDNEYS, URETERS AND BLADDER: No stones in the kidneys or ureters. No hydronephrosis. No perinephric or periureteral stranding. Urinary bladder is unremarkable. GI AND BOWEL: Mildly thick-walled loops of small bowel in the left mid abdomen with mild mesenteric stranding (image 44), raising concern for infectious/inflammatory enteritis. No pneumatosis or free air. Sigmoid diverticulosis, without evidence of diverticulitis. Normal appendix (image 48). REPRODUCTIVE: Brachytherapy seeds along the prostate. PERITONEUM AND RETROPERITONEUM: Small volume abdominopelvic ascites, predominantly perisplenic. LYMPH NODES: No lymphadenopathy. BONES AND SOFT TISSUES: No acute abnormality of the visualized bones. Tiny fat containing left inguinal hernia. Atherosclerotic calcifications of the abdominal aorta and branch vessels, although patent. IMPRESSION: 1. No evidence of pulmonary embolism. 2. Mild small bowel wall thickening with mesenteric stranding in the left mid abdomen, suggesting infectious/inflammatory enteritis. No pneumatosis or free air. 3. Radiation changes in the left hemithorax. No  findings suspicious for recurrent or metastatic disease. 4. Additional ancillary findings as above. Electronically signed by: Pinkie Pebbles MD 10/30/2023 10:32 PM EDT RP Workstation: [id number]   DG Chest Port 1 View Result Date: 10/30/2023 CLINICAL DATA:  Chest pain and dizziness. Central chest pain  radiating to both sides. EXAM: PORTABLE CHEST 1 VIEW COMPARISON:  CT chest 06/22/2023 FINDINGS: Tracheostomy tube with tip measuring 4.7 cm above the carina. Power port type central venous catheter with tip over the cavoatrial junction region. No pneumothorax. Shallow inspiration. Mild cardiac enlargement. No vascular congestion or edema. Mild left perihilar and basilar infiltration may represent pneumonia or atelectasis. Probable small left pleural effusion. Calcification of the aorta. Degenerative changes in the spine and shoulders. IMPRESSION: Probable small left pleural effusion with infiltration or atelectasis in the left mid and lower lung zone, likely pneumonia. Electronically Signed   By: Elsie Gravely M.D.   On: 10/30/2023 19:17     Medical Consultants:   None.   Subjective:    Shaun Kennedy feels about the same, still slightly short of breath  Objective:    Vitals:   10/31/23 2302 11/01/23 0007 11/01/23 0251 11/01/23 0334  BP:  (!) 125/59 122/63   Pulse:  66 (!) 58   Resp:  18 20   Temp:  98.4 F (36.9 C) 98.3 F (36.8 C)   TempSrc:  Oral Oral   SpO2: 100% 100% 98%   Weight:  84.5 kg  84.5 kg  Height:       SpO2: 98 % O2 Flow Rate (L/min): 6 L/min FiO2 (%): 21 %   Intake/Output Summary (Last 24 hours) at 11/01/2023 0716 Last data filed at 11/01/2023 0252 Gross per 24 hour  Intake 1204.61 ml  Output 1000 ml  Net 204.61 ml   Filed Weights   10/31/23 1537 11/01/23 0007 11/01/23 0334  Weight: 82.6 kg 84.5 kg 84.5 kg    Exam: General exam: In no acute distress. Respiratory system: Good air movement and clear to auscultation. Cardiovascular system: S1 & S2  heard, RRR. No JVD. Gastrointestinal system: Abdomen is nondistended, soft and nontender.  Extremities: No pedal edema. Skin: No rashes, lesions or ulcers Psychiatry: Judgement and insight appear normal. Mood & affect appropriate.    Data Reviewed:    Labs: Basic Metabolic Panel: Recent Labs  Lab 10/30/23 1829 10/30/23 1926 10/30/23 1951 10/31/23 0453  NA 137  --  140 136  K 3.6  --  3.7 3.7  CL 104  --  107 108  CO2 20*  --   --  21*  GLUCOSE 99  --  103* 101*  BUN 23  --  25* 18  CREATININE 1.71*  --  1.80* 1.45*  CALCIUM 8.8*  --   --  8.0*  MG  --  1.8  --  1.6*   GFR Estimated Creatinine Clearance: 39.4 mL/min (A) (by C-G formula based on SCr of 1.45 mg/dL (H)). Liver Function Tests: Recent Labs  Lab 10/30/23 1829 10/31/23 0453  AST 20 15  ALT 13 12  ALKPHOS 45 40  BILITOT 1.2 0.9  PROT 7.4 6.4*  ALBUMIN 3.1* 2.7*   Recent Labs  Lab 10/30/23 1829  LIPASE 34   No results for input(s): AMMONIA in the last 168 hours. Coagulation profile No results for input(s): INR, PROTIME in the last 168 hours. COVID-19 Labs  Recent Labs    10/30/23 1829  DDIMER >20.00*    Lab Results  Component Value Date   SARSCOV2NAA RESULT: NEGATIVE 02/23/2021   SARSCOV2NAA RESULT: NEGATIVE 11/06/2020   SARSCOV2NAA NEGATIVE 05/27/2020   SARSCOV2NAA NEGATIVE 05/15/2020    CBC: Recent Labs  Lab 10/30/23 1829 10/30/23 1951 10/31/23 0453  WBC 12.0*  --  8.8  NEUTROABS  --   --  6.9  HGB 12.1* 12.6* 10.0*  HCT 37.5* 37.0* 30.2*  MCV 96.2  --  95.3  PLT 335  --  287   Cardiac Enzymes: No results for input(s): CKTOTAL, CKMB, CKMBINDEX, TROPONINI in the last 168 hours. BNP (last 3 results) No results for input(s): PROBNP in the last 8760 hours. CBG: Recent Labs  Lab 10/31/23 1745 10/31/23 2057 11/01/23 0543  GLUCAP 114* 107* 87   D-Dimer: Recent Labs    10/30/23 1829  DDIMER >20.00*   Hgb A1c: No results for input(s): HGBA1C in the  last 72 hours. Lipid Profile: No results for input(s): CHOL, HDL, LDLCALC, TRIG, CHOLHDL, LDLDIRECT in the last 72 hours. Thyroid  function studies: Recent Labs    10/30/23 1939  TSH 2.196   Anemia work up: No results for input(s): VITAMINB12, FOLATE, FERRITIN, TIBC, IRON, RETICCTPCT in the last 72 hours. Sepsis Labs: Recent Labs  Lab 10/30/23 1829 10/30/23 1951 10/30/23 2120 10/31/23 0453  PROCALCITON  --   --  <0.10  --   WBC 12.0*  --   --  8.8  LATICACIDVEN  --  1.2  --   --    Microbiology Recent Results (from the past 240 hours)  Blood culture (routine x 2)     Status: None (Preliminary result)   Collection Time: 10/30/23  8:20 PM   Specimen: BLOOD RIGHT ARM  Result Value Ref Range Status   Specimen Description BLOOD RIGHT ARM  Final   Special Requests   Final    BOTTLES DRAWN AEROBIC AND ANAEROBIC Blood Culture adequate volume   Culture   Final    NO GROWTH 2 DAYS Performed at [facility], [address] [place], [place] [address]    Report Status PENDING  Incomplete  Blood culture (routine x 2)     Status: None (Preliminary result)   Collection Time: 10/30/23  9:20 PM   Specimen: BLOOD LEFT ARM  Result Value Ref Range Status   Specimen Description BLOOD LEFT ARM  Final   Special Requests   Final    BOTTLES DRAWN AEROBIC AND ANAEROBIC Blood Culture adequate volume   Culture   Final    NO GROWTH 2 DAYS Performed at [facility], [address] [place], [place] [address]    Report Status PENDING  Incomplete     Medications:    aspirin  EC  81 mg Oral QPC breakfast   Chlorhexidine  Gluconate Cloth  6 each Topical Daily   cholecalciferol   1,000 Units Oral QPC breakfast   donepezil   10 mg Oral QHS   enoxaparin  (LOVENOX ) injection  40 mg Subcutaneous Q24H   ferrous sulfate   325 mg Oral QPC breakfast   guaiFENesin   600 mg Oral BID   insulin  aspart  0-5 Units Subcutaneous QHS   insulin  aspart  0-9 Units Subcutaneous TID  WC   pantoprazole   40 mg Oral Daily   simvastatin   40 mg Oral Daily   sodium chloride  flush  10-40 mL Intracatheter Q12H   Continuous Infusions:  piperacillin -tazobactam (ZOSYN )  IV 3.375 g (11/01/23 0523)      LOS: 0 days   Shaun Kennedy  [facility]  11/01/2023, 7:16 AM

## 2023-11-01 NOTE — Evaluation (Signed)
 Physical Therapy Evaluation Patient Details Name: Shaun Kennedy MRN: [id number] DOB: January 21, 1941 Today's Date: 11/01/2023  History of Present Illness  Patient is a 83 yo male presenting to the hospital for a syncopal episode 2/2 intermittient complete heart block and CAP. PMHx: glotic stenosis s/p trach, HTN DM, squamous cell CA,NSCLC  Clinical Impression  Pt admitted with above. Pt functioning at contact guard level without AD on RA with mild instability with higher level balance. Anticipate patient to progress well and be able to return home with spouse without need for follow up PT once medically stable for d/c. Will have mobility team see patient daily and acute PT to monitor patient while in hospital.      If plan is discharge home, recommend the following: A little help with walking and/or transfers;A little help with bathing/dressing/bathroom;Help with stairs or ramp for entrance;Assist for transportation   Can travel by private vehicle        Equipment Recommendations None recommended by PT  Recommendations for Other Services       Functional Status Assessment Patient has had a recent decline in their functional status and demonstrates the ability to make significant improvements in function in a reasonable and predictable amount of time.     Precautions / Restrictions Precautions Precautions: None Restrictions Weight Bearing Restrictions Per Provider Order: No      Mobility  Bed Mobility               General bed mobility comments: pt received and returned to sitting in chair    Transfers Overall transfer level: Needs assistance Equipment used: None Transfers: Sit to/from Stand Sit to Stand: Contact guard assist           General transfer comment: no difficulty    Ambulation/Gait Ambulation/Gait assistance: Contact guard assist Gait Distance (Feet): 300 Feet Assistive device: None Gait Pattern/deviations: Step-through pattern, Decreased stride  length, Drifts right/left Gait velocity: wfl     General Gait Details: no overt LOB however did have episodes of near cross over gait when manageing obstacles  Stairs            Wheelchair Mobility     Tilt Bed    Modified Rankin (Stroke Patients Only)       Balance Overall balance assessment: Needs assistance Sitting-balance support: Feet supported, No upper extremity supported       Standing balance support: No upper extremity supported Standing balance-Leahy Scale: Good                   Standardized Balance Assessment Standardized Balance Assessment : Dynamic Gait Index   Dynamic Gait Index Level Surface: Normal Change in Gait Speed: Mild Impairment Gait with Horizontal Head Turns: Mild Impairment Gait with Vertical Head Turns: Mild Impairment Gait and Pivot Turn: Normal Step Over Obstacle: Mild Impairment Step Around Obstacles: Mild Impairment Steps: Mild Impairment Total Score: 18       Pertinent Vitals/Pain Pain Assessment Pain Assessment: No/denies pain    Home Living Family/patient expects to be discharged to:: Private residence Living Arrangements: Spouse/significant other Available Help at Discharge: Family Type of Home: House Home Access: Stairs to enter Entrance Stairs-Rails: Can reach both Entrance Stairs-Number of Steps: 4   Home Layout: One level Home Equipment: Agricultural consultant (2 wheels);Cane - single point;Shower seat      Prior Function Prior Level of Function : Independent/Modified Independent             Mobility Comments: no AD, drives ADLs  Comments: indep with trach care     Extremity/Trunk Assessment   Upper Extremity Assessment Upper Extremity Assessment: Overall WFL for tasks assessed    Lower Extremity Assessment Lower Extremity Assessment: Overall WFL for tasks assessed    Cervical / Trunk Assessment Cervical / Trunk Assessment: Normal  Communication   Communication Communication: No apparent  difficulties (has trach)    Cognition Arousal: Alert Behavior During Therapy: WFL for tasks assessed/performed                             Following commands: Intact       Cueing Cueing Techniques: Verbal cues     General Comments General comments (skin integrity, edema, etc.): VSS, SpO2 > 98% on RA    Exercises     Assessment/Plan    PT Assessment Patient needs continued PT services  PT Problem List Decreased strength;Decreased activity tolerance;Decreased balance;Decreased mobility       PT Treatment Interventions DME instruction;Gait training;Stair training;Functional mobility training;Therapeutic activities;Therapeutic exercise;Balance training    PT Goals (Current goals can be found in the Care Plan section)  Acute Rehab PT Goals Patient Stated Goal: home PT Goal Formulation: With patient Time For Goal Achievement: 11/15/23 Potential to Achieve Goals: Good Additional Goals Additional Goal #1: Pt to score > 19 on DGI to indicate minimal falls risk.    Frequency Min 2X/week     Co-evaluation               AM-PAC PT 6 Clicks Mobility  Outcome Measure Help needed turning from your back to your side while in a flat bed without using bedrails?: None Help needed moving from lying on your back to sitting on the side of a flat bed without using bedrails?: A Little Help needed moving to and from a bed to a chair (including a wheelchair)?: A Little Help needed standing up from a chair using your arms (e.g., wheelchair or bedside chair)?: A Little Help needed to walk in hospital room?: A Little Help needed climbing 3-5 steps with a railing? : A Little 6 Click Score: 19    End of Session Equipment Utilized During Treatment: Gait belt Activity Tolerance: Patient tolerated treatment well Patient left: in chair;with call bell/phone within reach;with chair alarm set Nurse Communication: Mobility status PT Visit Diagnosis: Unsteadiness on feet  (R26.81);Muscle weakness (generalized) (M62.81);Difficulty in walking, not elsewhere classified (R26.2)    Time: 8853-8796 PT Time Calculation (min) (ACUTE ONLY): 17 min   Charges:   PT Evaluation $PT Eval Low Complexity: 1 Low   PT General Charges $$ ACUTE PT VISIT: 1 Visit         Norene Ames, PT, DPT Acute Rehabilitation Services Secure chat preferred Office #: [phone]   Norene CHRISTELLA Ames 11/01/2023, 2:16 PM

## 2023-11-01 NOTE — Evaluation (Signed)
 Occupational Therapy Evaluation Patient Details Name: Shaun Kennedy MRN: [id number] DOB: March 28, 1940 Today's Date: 11/01/2023   History of Present Illness   Patient is a 83 yo male presenting to the hospital for a syncopal episode 2/2 intermittient complete heart block and CAP. PMHx: glotic stenosis s/p trach, HTN DM, squamous cell CA,NSCLC     Clinical Impressions Patient reporting that he lives with his spouse in a 1 level home and is Independent in ADLs and IADLs with no AD for functional mobility. Patient current presents with requiring CGA for functional mobility and MinA  /CGA for LB ADLs at this time. Patient would beenfit fro additional OT intervention to address functional deficits listed above in order for patient to return to PLOF. OT to follow acutely.     If plan is discharge home, recommend the following:   Assistance with cooking/housework;A little help with bathing/dressing/bathroom     Functional Status Assessment   Patient has had a recent decline in their functional status and demonstrates the ability to make significant improvements in function in a reasonable and predictable amount of time.     Equipment Recommendations   None recommended by OT     Recommendations for Other Services         Precautions/Restrictions   Precautions Precautions: None Restrictions Weight Bearing Restrictions Per Provider Order: No     Mobility Bed Mobility Overal bed mobility: Modified Independent             General bed mobility comments: HOB elevated    Transfers Overall transfer level: Needs assistance   Transfers: Sit to/from Stand Sit to Stand: Contact guard assist                  Balance Overall balance assessment: Needs assistance Sitting-balance support: Feet supported, No upper extremity supported       Standing balance support: No upper extremity supported                               ADL either performed or  assessed with clinical judgement   ADL Overall ADL's : Needs assistance/impaired Eating/Feeding: Independent   Grooming: Wash/dry hands;Wash/dry face;Set up   Upper Body Bathing: Set up;Sitting   Lower Body Bathing: Contact guard assist   Upper Body Dressing : Set up;Sitting   Lower Body Dressing: Contact guard assist   Toilet Transfer: Contact guard assist   Toileting- Clothing Manipulation and Hygiene: Contact guard assist       Functional mobility during ADLs: Contact guard assist;Supervision/safety       Vision Baseline Vision/History: 1 Wears glasses Patient Visual Report: No change from baseline       Perception         Praxis         Pertinent Vitals/Pain Pain Assessment Pain Assessment: No/denies pain     Extremity/Trunk Assessment Upper Extremity Assessment Upper Extremity Assessment: Overall WFL for tasks assessed   Lower Extremity Assessment Lower Extremity Assessment: Defer to PT evaluation   Cervical / Trunk Assessment Cervical / Trunk Assessment: Normal   Communication Communication Communication: No apparent difficulties (has trach)   Cognition Arousal: Alert Behavior During Therapy: WFL for tasks assessed/performed Cognition: No apparent impairments                               Following commands: Intact       Cueing  General Comments   Cueing Techniques: Verbal cues      Exercises     Shoulder Instructions      Home Living Family/patient expects to be discharged to:: Private residence Living Arrangements: Spouse/significant other Available Help at Discharge: Family Type of Home: House Home Access: Stairs to enter [employer] of Steps: 4 Entrance Stairs-Rails: Can reach both Home Layout: One level     Bathroom Shower/Tub: Tub/shower unit;Walk-in shower   Bathroom Toilet: Standard Bathroom Accessibility: Yes How Accessible: Accessible via walker Home Equipment: Rolling Walker (2  wheels);Cane - single point;Shower seat          Prior Functioning/Environment Prior Level of Function : Independent/Modified Independent                    OT Problem List: Decreased strength;Decreased activity tolerance   OT Treatment/Interventions: Self-care/ADL training;Therapeutic exercise;Therapeutic activities;Patient/family education      OT Goals(Current goals can be found in the care plan section)   Acute Rehab OT Goals OT Goal Formulation: With patient Time For Goal Achievement: 11/15/23 Potential to Achieve Goals: Good   OT Frequency:  Min 2X/week    Co-evaluation              AM-PAC OT 6 Clicks Daily Activity     Outcome Measure Help from another person eating meals?: None Help from another person taking care of personal grooming?: A Little Help from another person toileting, which includes using toliet, bedpan, or urinal?: A Little Help from another person bathing (including washing, rinsing, drying)?: A Little Help from another person to put on and taking off regular upper body clothing?: A Little Help from another person to put on and taking off regular lower body clothing?: A Little 6 Click Score: 19   End of Session Equipment Utilized During Treatment: Oxygen  Nurse Communication: Mobility status  Activity Tolerance: Patient tolerated treatment well Patient left: in chair;with call bell/phone within reach;with chair alarm set  OT Visit Diagnosis: Unsteadiness on feet (R26.81);Muscle weakness (generalized) (M62.81)                Time: 9061-8988 OT Time Calculation (min): 33 min Charges:  OT General Charges $OT Visit: 1 Visit OT Evaluation $OT Eval Moderate Complexity: 1 Mod OT Treatments $Self Care/Home Management : 8-22 mins Shaun Kennedy OT/L  Shaun Kennedy 11/01/2023, 1:41 PM

## 2023-11-01 NOTE — Care Management Obs Status (Signed)
 MEDICARE OBSERVATION STATUS NOTIFICATION   Patient Details  Name: Shaun Kennedy MRN: [id number] Date of Birth: Sep 13, 1940   Medicare Observation Status Notification Given:  Yes    Vonzell Arrie Sharps 11/01/2023, 10:19 AM

## 2023-11-02 DIAGNOSIS — I442 Atrioventricular block, complete: Secondary | ICD-10-CM

## 2023-11-02 DIAGNOSIS — R55 Syncope and collapse: Secondary | ICD-10-CM | POA: Diagnosis not present

## 2023-11-02 DIAGNOSIS — J189 Pneumonia, unspecified organism: Secondary | ICD-10-CM

## 2023-11-02 LAB — GLUCOSE, CAPILLARY
Glucose-Capillary: 86 mg/dL (ref 70–99)
Glucose-Capillary: 124 mg/dL — ABNORMAL HIGH (ref 70–99)
Glucose-Capillary: 83 mg/dL (ref 70–99)
Glucose-Capillary: 111 mg/dL — ABNORMAL HIGH (ref 70–99)

## 2023-11-02 LAB — MAGNESIUM: Magnesium: 2 mg/dL (ref 1.7–2.4)

## 2023-11-02 MED ORDER — AMOXICILLIN-POT CLAVULANATE 875-125 MG PO TABS
1.0000 | ORAL_TABLET | Freq: Two times a day (BID) | ORAL | Status: DC
  Administered 2023-11-02 – 2023-11-03 (×3): 1 via ORAL
  Filled 2023-11-02 (×3): qty 1

## 2023-11-02 NOTE — Progress Notes (Addendum)
 Mobility Specialist Progress Note;   11/02/23 1033  Mobility  Activity Ambulated with assistance  Level of Assistance Standby assist, set-up cues, supervision of patient - no hands on  Assistive Device Other (Comment) (IV pole)  Distance Ambulated (ft) 375 ft  Activity Response Tolerated well  Mobility Referral Yes  Mobility visit 1 Mobility  Mobility Specialist Start Time (ACUTE ONLY) 1033  Mobility Specialist Stop Time (ACUTE ONLY) 1040  Mobility Specialist Time Calculation (min) (ACUTE ONLY) 7 min   Pt in chair upon arrival, agreeable to mobility. Required no physical assistance during ambulation, SV for safety. No LOBs noted. VSS on RA. Pt returned back to chair and left with all needs met, alarm on.   Lauraine Erm Mobility Specialist Please contact via SecureChat or [employer] [phone]

## 2023-11-02 NOTE — Plan of Care (Signed)

## 2023-11-02 NOTE — Progress Notes (Signed)
 [facility] PROGRESS NOTE    Progress Note  Shaun Kennedy  FMW:[id number] DOB: [birth year] DOA: 10/30/2023 PCP: [name], MD     Brief Narrative:   Shaun Kennedy is an 83 y.o. male past medical history of glottic stenosis status post tracheostomy, essential hypertension hyperlipidemia, diabetes mellitus type 2, prior NSCLC and history of squamous cell carcinoma comes into the ED for syncopal episode while playing cards with family, after that she developed nausea and vomiting denies any prodromal symptoms, developed another episode in the ED where he was reported he had a complete heart block.  EP was consulted.  He was also found to be in acute kidney injury and possible aspiration pneumonia  Assessment/Plan:   Syncope likely due to intermittent complete heart block: Apparently it was seen that he developed an complete heart block he remained asymptomatic. He is on no AV nodal blocking agents. Cardiology recommended a 2D echo that showed an EF of 60% no regional wall motion abnormality. Keep potassium greater than 4 magnesium  greater than 2. EP evaluated the patient they relate this most likely due to infection and plus or minus increased vasovagal tone   Community-acquired pneumonia versus aspiration pneumonia: D-dimer was greater than 20 so CT angio was done that showed no PE, with radiation changes to the left hemithorax. CT scan of the abdomen pelvis showed small bowel wall thickening with stranding in the left abdomen, no pneumatosis or free air. Has defervesced leukocytosis improved. Transition to oral Augmentin  monitor for 24 hours.  Acute kidney injury: Likely hemodynamically mediated. With a baseline creatinine 1.3-1.4, on admission 1.8 was started on IV fluids creatinine has improved to baseline.  Diabetes mellitus type II, non insulin  dependent (HCC) Last A1c of 6.3. Continue AC and at bedtime sliding scale insulin .  Hypertension associated with diabetes  (HCC) Continue to hold all antihypertensive medication, his blood pressure seems to be well-controlled.  Hyperlipidemia associated with type 2 diabetes mellitus (HCC) Continue statins.   DVT prophylaxis: lovenox  Family Communication:none Status is: Observation The patient remains OBS appropriate and will d/c before 2 midnights.    Code Status:     Code Status Orders  (From admission, onward)           Start     Ordered   10/31/23 0739  Full code  Continuous       Question:  By:  Answer:  Consent: discussion documented in EHR   10/31/23 0741           Code Status History     Date Active Date Inactive Code Status Order ID Comments User Context   10/30/2023 2348 10/31/2023 0741 Full Code [id number]  Marcene Eva NOVAK, DO ED   04/20/2020 0056 04/23/2020 1925 Full Code [id number]  Charlton Evalene RAMAN, MD Inpatient   04/10/2020 1509 04/15/2020 1951 Full Code [id number]  Carlie Clark, MD Inpatient   04/02/2016 2329 04/03/2016 1614 Full Code [id number]  Jerri Kay HERO, MD Inpatient         IV Access:   Peripheral IV   Procedures and diagnostic studies:   ECHOCARDIOGRAM COMPLETE Result Date: 10/31/2023    ECHOCARDIOGRAM REPORT   Patient Name:   Shaun Kennedy Date of Exam: 10/31/2023 Medical Rec #:  [id number]      Height:       68.0 in Accession #:    [id number]     Weight:       186.5 lb Date of Birth:  07-06-1940  BSA:          1.984 m Patient Age:    83 years       BP:           102/57 mmHg Patient Gender: M              HR:           54 bpm. Exam Location:  Inpatient Procedure: 2D Echo, Cardiac Doppler and Color Doppler (Both Spectral and Color            Flow Doppler were utilized during procedure). Indications:    Syncope R55  History:        Patient has no prior history of Echocardiogram examinations.                 Risk Factors:Hypertension, Diabetes and Former Smoker.  Sonographer:    Jayson Gaskins Referring Phys: [id number] JUSTIN B HOWERTER IMPRESSIONS  1. Calcified chords.  Left ventricular ejection fraction, by estimation, is 55 to 60%. The left ventricle has normal function. The left ventricle has no regional wall motion abnormalities. Left ventricular diastolic parameters were normal.  2. Right ventricular systolic function is normal. The right ventricular size is normal.  3. Left atrial size was mildly dilated.  4. The mitral valve is grossly normal. Trivial mitral valve regurgitation.  5. The aortic valve is tricuspid. Aortic valve regurgitation is not visualized. Aortic valve sclerosis is present, with no evidence of aortic valve stenosis. Comparison(s): No prior Echocardiogram. FINDINGS  Left Ventricle: Calcified chords. Left ventricular ejection fraction, by estimation, is 55 to 60%. The left ventricle has normal function. The left ventricle has no regional wall motion abnormalities. The left ventricular internal cavity size was normal  in size. There is no left ventricular hypertrophy. Left ventricular diastolic parameters were normal. Right Ventricle: The right ventricular size is normal. No increase in right ventricular wall thickness. Right ventricular systolic function is normal. Left Atrium: Left atrial size was mildly dilated. Right Atrium: Right atrial size was normal in size. Pericardium: There is no evidence of pericardial effusion. Mitral Valve: The mitral valve is grossly normal. Mild mitral annular calcification. Trivial mitral valve regurgitation. Tricuspid Valve: The tricuspid valve is grossly normal. Tricuspid valve regurgitation is trivial. Aortic Valve: The aortic valve is tricuspid. Aortic valve regurgitation is not visualized. Aortic valve sclerosis is present, with no evidence of aortic valve stenosis. Aortic valve mean gradient measures 4.0 mmHg. Aortic valve peak gradient measures 6.0  mmHg. Aortic valve area, by VTI measures 2.80 cm. Pulmonic Valve: The pulmonic valve was normal in structure. Pulmonic valve regurgitation is trivial. No evidence of  pulmonic stenosis. Aorta: The aortic root is normal in size and structure. Venous: The inferior vena cava was not well visualized. IAS/Shunts: The interatrial septum was not well visualized.  LEFT VENTRICLE PLAX 2D LVIDd:         4.70 cm   Diastology LVIDs:         2.70 cm   LV e' medial:    8.70 cm/s LV PW:         1.10 cm   LV E/e' medial:  12.8 LV IVS:        0.90 cm   LV e' lateral:   8.27 cm/s LVOT diam:     1.90 cm   LV E/e' lateral: 13.4 LV SV:         84 LV SV Index:   43 LVOT Area:     2.84 cm  RIGHT VENTRICLE RV S prime:     11.70 cm/s TAPSE (M-mode): 2.1 cm LEFT ATRIUM             Index        RIGHT ATRIUM           Index LA Vol (A2C):   41.4 ml 20.87 ml/m  RA Area:     10.30 cm LA Vol (A4C):   66.0 ml 33.27 ml/m  RA Volume:   15.90 ml  8.01 ml/m LA Biplane Vol: 55.7 ml 28.07 ml/m  AORTIC VALVE AV Area (Vmax):    2.93 cm AV Area (Vmean):   2.62 cm AV Area (VTI):     2.80 cm AV Vmax:           122.00 cm/s AV Vmean:          95.900 cm/s AV VTI:            0.302 m AV Peak Grad:      6.0 mmHg AV Mean Grad:      4.0 mmHg LVOT Vmax:         126.00 cm/s LVOT Vmean:        88.700 cm/s LVOT VTI:          0.298 m LVOT/AV VTI ratio: 0.99  AORTA Ao Root diam: 2.70 cm MITRAL VALVE MV Area (PHT): 3.26 cm     SHUNTS MV Decel Time: 233 msec     Systemic VTI:  0.30 m MV E velocity: 111.00 cm/s  Systemic Diam: 1.90 cm MV A velocity: 105.00 cm/s MV E/A ratio:  1.06 Shaun Leavens MD Electronically signed by Shaun Leavens MD Signature Date/Time: 10/31/2023/2:15:56 PM    Final      Medical Consultants:   None.   Subjective:    Shaun Kennedy shortness of breath improved he relates he feels much better this morning.  Objective:    Vitals:   11/01/23 2033 11/01/23 2341 11/02/23 0314 11/02/23 0739  BP: (!) 126/58 (!) 141/58 (!) 148/57 (!) 143/59  Pulse:  (!) 49 (!) 48 (!) 55  Resp: 18 16 18 18   Temp: 98.2 F (36.8 C) 98 F (36.7 C) 97.7 F (36.5 C) 98.5 F (36.9 C)  TempSrc: Oral  Oral Oral Oral  SpO2: 92% 93% 99% 96%  Weight:   81.1 kg   Height:       SpO2: 96 % O2 Flow Rate (L/min): 6 L/min FiO2 (%): 21 %   Intake/Output Summary (Last 24 hours) at 11/02/2023 0802 Last data filed at 11/02/2023 0754 Gross per 24 hour  Intake 360 ml  Output 2200 ml  Net -1840 ml   Filed Weights   11/01/23 0007 11/01/23 0334 11/02/23 0314  Weight: 84.5 kg 84.5 kg 81.1 kg    Exam: General exam: In no acute distress. Respiratory system: Good air movement and clear to auscultation. Cardiovascular system: S1 & S2 heard, RRR. No JVD. Gastrointestinal system: Abdomen is nondistended, soft and nontender.  Extremities: No pedal edema. Skin: No rashes, lesions or ulcers Psychiatry: Judgement and insight appear normal. Mood & affect appropriate.  Data Reviewed:    Labs: Basic Metabolic Panel: Recent Labs  Lab 10/30/23 1829 10/30/23 1926 10/30/23 1951 10/31/23 0453 11/01/23 0755  NA 137  --  140 136 137  K 3.6  --  3.7 3.7 3.8  CL 104  --  107 108 108  CO2 20*  --   --  21* 22  GLUCOSE 99  --  103* 101* 86  BUN 23  --  25* 18 11  CREATININE 1.71*  --  1.80* 1.45* 1.31*  CALCIUM 8.8*  --   --  8.0* 8.1*  MG  --  1.8  --  1.6* 1.8   GFR Estimated Creatinine Clearance: 39.9 mL/min (A) (by C-G formula based on SCr of 1.31 mg/dL (H)). Liver Function Tests: Recent Labs  Lab 10/30/23 1829 10/31/23 0453  AST 20 15  ALT 13 12  ALKPHOS 45 40  BILITOT 1.2 0.9  PROT 7.4 6.4*  ALBUMIN 3.1* 2.7*   Recent Labs  Lab 10/30/23 1829  LIPASE 34   No results for input(s): AMMONIA in the last 168 hours. Coagulation profile No results for input(s): INR, PROTIME in the last 168 hours. COVID-19 Labs  Recent Labs    10/30/23 1829  DDIMER >20.00*    Lab Results  Component Value Date   SARSCOV2NAA RESULT: NEGATIVE 02/23/2021   SARSCOV2NAA RESULT: NEGATIVE 11/06/2020   SARSCOV2NAA NEGATIVE 05/27/2020   SARSCOV2NAA NEGATIVE 05/15/2020    CBC: Recent Labs   Lab 10/30/23 1829 10/30/23 1951 10/31/23 0453 11/01/23 0755  WBC 12.0*  --  8.8 7.4  NEUTROABS  --   --  6.9  --   HGB 12.1* 12.6* 10.0* 9.8*  HCT 37.5* 37.0* 30.2* 29.9*  MCV 96.2  --  95.3 94.6  PLT 335  --  287 289   Cardiac Enzymes: No results for input(s): CKTOTAL, CKMB, CKMBINDEX, TROPONINI in the last 168 hours. BNP (last 3 results) No results for input(s): PROBNP in the last 8760 hours. CBG: Recent Labs  Lab 11/01/23 1149 11/01/23 1531 11/01/23 2107 11/02/23 0426 11/02/23 0753  GLUCAP 88 111* 104* 86 124*   D-Dimer: Recent Labs    10/30/23 1829  DDIMER >20.00*   Hgb A1c: No results for input(s): HGBA1C in the last 72 hours. Lipid Profile: No results for input(s): CHOL, HDL, LDLCALC, TRIG, CHOLHDL, LDLDIRECT in the last 72 hours. Thyroid  function studies: Recent Labs    10/30/23 1939  TSH 2.196   Anemia work up: No results for input(s): VITAMINB12, FOLATE, FERRITIN, TIBC, IRON, RETICCTPCT in the last 72 hours. Sepsis Labs: Recent Labs  Lab 10/30/23 1829 10/30/23 1951 10/30/23 2120 10/31/23 0453 11/01/23 0755  PROCALCITON  --   --  <0.10  --   --   WBC 12.0*  --   --  8.8 7.4  LATICACIDVEN  --  1.2  --   --   --    Microbiology Recent Results (from the past 240 hours)  Blood culture (routine x 2)     Status: None (Preliminary result)   Collection Time: 10/30/23  8:20 PM   Specimen: BLOOD RIGHT ARM  Result Value Ref Range Status   Specimen Description BLOOD RIGHT ARM  Final   Special Requests   Final    BOTTLES DRAWN AEROBIC AND ANAEROBIC Blood Culture adequate volume   Culture   Final    NO GROWTH 3 DAYS Performed at [facility], [address] [place], [place] [address]    Report Status PENDING  Incomplete  Blood culture (routine x 2)     Status: None (Preliminary result)   Collection Time: 10/30/23  9:20 PM   Specimen: BLOOD LEFT ARM  Result Value Ref Range Status   Specimen Description  BLOOD LEFT ARM  Final   Special Requests   Final    BOTTLES DRAWN AEROBIC AND ANAEROBIC Blood Culture adequate volume  Culture   Final    NO GROWTH 3 DAYS Performed at [facility], [address] [place], [place] [address]    Report Status PENDING  Incomplete     Medications:    aspirin  EC  81 mg Oral QPC breakfast   Chlorhexidine  Gluconate Cloth  6 each Topical Daily   cholecalciferol   1,000 Units Oral QPC breakfast   donepezil   10 mg Oral QHS   enoxaparin  (LOVENOX ) injection  40 mg Subcutaneous Q24H   ferrous sulfate   325 mg Oral QPC breakfast   guaiFENesin   600 mg Oral BID   insulin  aspart  0-5 Units Subcutaneous QHS   insulin  aspart  0-9 Units Subcutaneous TID WC   pantoprazole   40 mg Oral Daily   simvastatin   40 mg Oral Daily   sodium chloride  flush  10-40 mL Intracatheter Q12H   Continuous Infusions:  piperacillin -tazobactam (ZOSYN )  IV 3.375 g (11/02/23 0601)      LOS: 1 day   Shaun Kennedy  [facility]  11/02/2023, 8:02 AM

## 2023-11-02 NOTE — TOC Initial Note (Signed)
 Transition of Care [facility]) - Initial/Assessment Note    Patient Details  Name: Shaun Kennedy MRN: [id number] Date of Birth: Oct 29, 1940  Transition of Care [facility]) CM/SW Contact:    Sudie Erminio Deems, RN Phone Number: 11/02/2023, 12:57 PM  Clinical Narrative: Patient Presented for syncope. PTA patient states he is from home with spouse. Son and daughter are at the bedside during the visit. Case Manager discussed home health and he states he will not need any services. Patient states he is independent and plays golf daily. Patient has PCP and insurance-no issues with transportation or obtaining medications. No home needs identified at this time by Inpatient Case Manager.        Expected Discharge Plan: Home/Self Care Barriers to Discharge: No Barriers Identified   Patient Goals and CMS Choice Patient states their goals for this hospitalization and ongoing recovery are:: Plans to return to playing golf.   Choice offered to / list presented to : NA      Expected Discharge Plan and Services In-house Referral: NA Discharge Planning Services: CM Consult Post Acute Care Choice: NA Living arrangements for the past 2 months: Single Family Home                   DME Agency: NA       HH Arranged: NA  Prior Living Arrangements/Services Living arrangements for the past 2 months: Single Family Home Lives with:: Spouse   Do you feel safe going back to the place where you live?: Yes      Need for Family Participation in Patient Care: No (Comment) Care giver support system in place?: No (comment)   Criminal Activity/Legal Involvement Pertinent to Current Situation/Hospitalization: No - Comment as needed  Activities of Daily Living   ADL Screening (condition at time of admission) Independently performs ADLs?: Yes (appropriate for developmental age) Is the patient deaf or have difficulty hearing?: No Does the patient have difficulty seeing, even when wearing glasses/contacts?:  No Does the patient have difficulty concentrating, remembering, or making decisions?: No  Permission Sought/Granted Permission sought to share information with : Family Supports, Case Manager   Emotional Assessment Appearance:: Appears stated age Attitude/Demeanor/Rapport: Engaged Affect (typically observed): Appropriate Orientation: : Oriented to Self, Oriented to Place, Oriented to  Time, Oriented to Situation Alcohol  / Substance Use: Not Applicable Psych Involvement: No (comment)  Admission diagnosis:  Syncope [R55] Syncope, unspecified syncope type [R55] Chest pain, unspecified type [R07.9] Patient Active Problem List   Diagnosis Date Noted   Intermittent complete heart block (HCC) 11/01/2023   CAP (community acquired pneumonia) 10/31/2023   AKI (acute kidney injury) 10/31/2023   CKD (chronic kidney disease) stage 3, GFR 30-59 ml/min (HCC) 10/31/2023   Syncope 10/30/2023   Advanced directives, counseling/discussion 06/23/2022   Abnormal CT scan, small bowel 11/24/2021   Mild cognitive impairment 02/04/2021   First degree heart block 02/04/2021   Right inguinal hernia 01/14/2021   Trachea, stenosis    Tracheostomy status (HCC)    Pleuritic chest pain 03/17/2020   Chemotherapy-induced neuropathy 02/12/2020   Vocal cord dysfunction 02/12/2020   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   Stridor 12/18/2019   Antineoplastic chemotherapy induced anemia 10/16/2019   Shortness of breath 10/15/2019   Odynophagia 09/25/2019   Port-A-Cath in place 09/17/2019   Malignant neoplasm of lower lobe of left lung (HCC) 08/28/2019   Encounter for antineoplastic chemotherapy 08/24/2019   Encounter for antineoplastic immunotherapy 08/24/2019   Goals of care, counseling/discussion 08/24/2019   Atherosclerosis of aorta 04/09/2019  Carpal tunnel syndrome, left upper limb 02/19/2019   History of carpal tunnel surgery of right wrist 12/21/2018   OAB (overactive bladder) 10/11/2017   Diabetic nephropathy  associated with type 2 diabetes mellitus (HCC) 10/11/2017   Onychomycosis of multiple toenails with type 2 diabetes mellitus (HCC) 07/22/2015   Former smoker, stopped smoking in distant past 07/22/2015   Diabetes mellitus type II, non insulin  dependent (HCC) 06/13/2011   ED (erectile dysfunction) 06/13/2011   History of prostate cancer 06/13/2011   Hypertension associated with diabetes (HCC) 06/13/2011   Hyperlipidemia associated with type 2 diabetes mellitus (HCC) 06/13/2011   Allergic rhinitis 06/13/2011   History of asthma 06/13/2011   Carotid stenosis 06/13/2011   PCP:  [name], MD Pharmacy:   [facility] [phone] [address], [place] - [address] [place] [address] Phone: [phone] Fax: [phone]  [facility] - [place], [place] - [address] [place] [address] Phone: [phone] Fax: [phone]  Social Drivers of Health (SDOH) Social History: SDOH Screenings   Food Insecurity: No Food Insecurity (10/31/2023)  Housing: Low Risk  (10/31/2023)  Transportation Needs: No Transportation Needs (10/31/2023)  Utilities: Not At Risk (10/31/2023)  Alcohol  Screen: Low Risk  (04/25/2023)  Depression (PHQ2-9): Low Risk  (10/26/2023)  Financial Resource Strain: Low Risk  (04/25/2023)  Physical Activity: Sufficiently Active (04/25/2023)  Social Connections: Moderately Isolated (10/31/2023)  Stress: No Stress Concern Present (04/25/2023)  Tobacco Use: Medium Risk (10/31/2023)  Health Literacy: Adequate Health Literacy (04/25/2023)   Readmission Risk Interventions     No data to display

## 2023-11-03 ENCOUNTER — Other Ambulatory Visit (HOSPITAL_COMMUNITY): Payer: Self-pay

## 2023-11-03 ENCOUNTER — Encounter: Payer: Self-pay | Admitting: Internal Medicine

## 2023-11-03 DIAGNOSIS — R55 Syncope and collapse: Secondary | ICD-10-CM | POA: Diagnosis not present

## 2023-11-03 DIAGNOSIS — R079 Chest pain, unspecified: Secondary | ICD-10-CM | POA: Diagnosis not present

## 2023-11-03 LAB — GLUCOSE, CAPILLARY
Glucose-Capillary: 102 mg/dL — ABNORMAL HIGH (ref 70–99)
Glucose-Capillary: 89 mg/dL (ref 70–99)
Glucose-Capillary: 89 mg/dL (ref 70–99)
Glucose-Capillary: 99 mg/dL (ref 70–99)

## 2023-11-03 MED ORDER — HEPARIN SOD (PORK) LOCK FLUSH 100 UNIT/ML IV SOLN
500.0000 [IU] | INTRAVENOUS | Status: AC | PRN
  Administered 2023-11-03: 500 [IU]

## 2023-11-03 MED ORDER — AMOXICILLIN-POT CLAVULANATE 875-125 MG PO TABS
1.0000 | ORAL_TABLET | Freq: Two times a day (BID) | ORAL | 0 refills | Status: AC
  Filled 2023-11-03: qty 10, 5d supply, fill #0

## 2023-11-03 NOTE — Progress Notes (Signed)
 While in room checking pt's trach, daughter on phone is requesting pt have Aerosol Trach collar for home use.  Discussed w/ RN and message sent to MD and RN.  Per pt, he may go home today.

## 2023-11-03 NOTE — Plan of Care (Signed)
  Problem: Education: Goal: Knowledge of condition and prescribed therapy will improve Outcome: Adequate for Discharge   Problem: Cardiac: Goal: Will achieve and/or maintain adequate cardiac output Outcome: Adequate for Discharge   Problem: Physical Regulation: Goal: Complications related to the disease process, condition or treatment will be avoided or minimized Outcome: Adequate for Discharge   Problem: Education: Goal: Ability to describe self-care measures that may prevent or decrease complications (Diabetes Survival Skills Education) will improve Outcome: Adequate for Discharge Goal: Individualized Educational Video(s) Outcome: Adequate for Discharge   Problem: Coping: Goal: Ability to adjust to condition or change in health will improve Outcome: Adequate for Discharge   Problem: Fluid Volume: Goal: Ability to maintain a balanced intake and output will improve Outcome: Adequate for Discharge   Problem: Health Behavior/Discharge Planning: Goal: Ability to identify and utilize available resources and services will improve Outcome: Adequate for Discharge Goal: Ability to manage health-related needs will improve Outcome: Adequate for Discharge   Problem: Metabolic: Goal: Ability to maintain appropriate glucose levels will improve Outcome: Adequate for Discharge   Problem: Nutritional: Goal: Maintenance of adequate nutrition will improve Outcome: Adequate for Discharge Goal: Progress toward achieving an optimal weight will improve Outcome: Adequate for Discharge   Problem: Skin Integrity: Goal: Risk for impaired skin integrity will decrease Outcome: Adequate for Discharge   Problem: Tissue Perfusion: Goal: Adequacy of tissue perfusion will improve Outcome: Adequate for Discharge   Problem: Education: Goal: Knowledge of General Education information will improve Description: Including pain rating scale, medication(s)/side effects and non-pharmacologic comfort  measures Outcome: Adequate for Discharge   Problem: Health Behavior/Discharge Planning: Goal: Ability to manage health-related needs will improve Outcome: Adequate for Discharge   Problem: Clinical Measurements: Goal: Ability to maintain clinical measurements within normal limits will improve Outcome: Adequate for Discharge Goal: Will remain free from infection Outcome: Adequate for Discharge Goal: Diagnostic test results will improve Outcome: Adequate for Discharge Goal: Respiratory complications will improve Outcome: Adequate for Discharge Goal: Cardiovascular complication will be avoided Outcome: Adequate for Discharge   Problem: Activity: Goal: Risk for activity intolerance will decrease Outcome: Adequate for Discharge   Problem: Nutrition: Goal: Adequate nutrition will be maintained Outcome: Adequate for Discharge   Problem: Coping: Goal: Level of anxiety will decrease Outcome: Adequate for Discharge   Problem: Elimination: Goal: Will not experience complications related to bowel motility Outcome: Adequate for Discharge Goal: Will not experience complications related to urinary retention Outcome: Adequate for Discharge   Problem: Pain Managment: Goal: General experience of comfort will improve and/or be controlled Outcome: Adequate for Discharge   Problem: Safety: Goal: Ability to remain free from injury will improve Outcome: Adequate for Discharge   Problem: Skin Integrity: Goal: Risk for impaired skin integrity will decrease Outcome: Adequate for Discharge

## 2023-11-03 NOTE — Discharge Summary (Addendum)
 Physician Discharge Summary  Shaun Kennedy:[id number] DOB: September 24, 1940 DOA: 10/30/2023  PCP: [name], MD  Admit date: 10/30/2023 Discharge date: 11/03/2023  Admitted From: Home Disposition:  Home  Recommendations for Outpatient Follow-up:  Follow up with PCP in 1-2 weeks Please obtain BMP/CBC in one week   Home Health:No Equipment/Devices:None  Discharge Condition:Stable CODE STATUS:Full Diet recommendation: Heart Healthy   Brief/Interim Summary: 83 y.o. male past medical history of glottic stenosis status post tracheostomy, essential hypertension hyperlipidemia, diabetes mellitus type 2, prior NSCLC and history of squamous cell carcinoma comes into the ED for syncopal episode while playing cards with family, after that she developed nausea and vomiting denies any prodromal symptoms, developed another episode in the ED where he was reported he had a complete heart block.  EP was consulted.  He was also found to be in acute kidney injury and possible aspiration pneumonia   Discharge Diagnoses:  Principal Problem:   Syncope Active Problems:   Diabetes mellitus type II, non insulin  dependent (HCC)   Hypertension associated with diabetes (HCC)   Hyperlipidemia associated with type 2 diabetes mellitus (HCC)   CAP (community acquired pneumonia)   AKI (acute kidney injury)   CKD (chronic kidney disease) stage 3, GFR 30-59 ml/min (HCC)   Intermittent complete heart block (HCC)  Syncope likely due to intermittent complete heart block: He was on no IV nodal blocking agents at home. Cardiology was consulted recommended a 2D echo that showed no regional wall motion abnormality, EF of 60%. EP evaluated the patient they relate that intermittent block was likely due to infectious etiology plus or minus increased vasogenic tone.  Community-acquired pneumonia: CT angio of the chest showed no PE but today showed bilateral infiltrates and radiation changes. He was started empirically  on IV antibiotics he defervesced leukocytosis resolved. He was transition to oral Augmentin  which should continue 5 additional days and outpatient.  Acute kidney injury on chronic kidney disease stage IIIa: Likely hemodynamic mediated resolved with fluid resuscitation.  Diabetes mellitus type 2: No changes made to his medication continue current regimen.  Central hypertension: All antihypertensive medications were held his blood pressure remains well-controlled without any antihypertensive medications these were held he will follow-up with PCP and restart as an outpatient.  Hyperlipidemia: Continue statins.  Discharge Instructions  Discharge Instructions     Diet - low sodium heart healthy   Complete by: As directed    Increase activity slowly   Complete by: As directed       Allergies as of 11/03/2023   No Known Allergies      Medication List     PAUSE taking these medications    lisinopril -hydrochlorothiazide  20-12.5 MG tablet Wait to take this until your doctor or other care provider tells you to start again. Commonly known as: ZESTORETIC  Take 1 tablet by mouth daily.       STOP taking these medications    multivitamin with minerals Tabs tablet       TAKE these medications    amLODipine  5 MG tablet Commonly known as: NORVASC  TAKE 1 TABLET (5 MG TOTAL) BY MOUTH DAILY.   amoxicillin -clavulanate 875-125 MG tablet Commonly known as: AUGMENTIN  Take 1 tablet by mouth every 12 (twelve) hours for 5 days.   aspirin  EC 81 MG tablet Take 81 mg by mouth daily after breakfast.   cholecalciferol  25 MCG (1000 UNIT) tablet Commonly known as: VITAMIN D3 Take 1,000 Units by mouth daily after breakfast.   donepezil  10 MG tablet Commonly  known as: ARICEPT  Take 1 tablet (10 mg total) by mouth at bedtime.   ferrous sulfate  325 (65 FE) MG EC tablet TAKE 1 TABLET EVERY DAY WITH BREAKFAST What changed: See the new instructions.   lidocaine -prilocaine   cream Commonly known as: EMLA  Apply 1 Application topically as needed.   metFORMIN  500 MG 24 hr tablet Commonly known as: GLUCOPHAGE -XR Take 1 tablet (500 mg total) by mouth daily. What changed: when to take this   simvastatin  40 MG tablet Commonly known as: ZOCOR  Take 1 tablet (40 mg total) by mouth daily.   solifenacin  5 MG tablet Commonly known as: VESICARE  Take 1 tablet (5 mg total) by mouth daily.   Systane Complete 0.6 % Soln Generic drug: Propylene Glycol Place 1 drop into both eyes every morning.        No Known Allergies  Consultations: Electrophysiology  Procedures/Studies: ECHOCARDIOGRAM COMPLETE Result Date: 10/31/2023    ECHOCARDIOGRAM REPORT   Patient Name:   Shaun Kennedy Date of Exam: 10/31/2023 Medical Rec #:  [id number]      Height:       68.0 in Accession #:    [id number]     Weight:       186.5 lb Date of Birth:  Dec 30, 1940      BSA:          1.984 m Patient Age:    83 years       BP:           102/57 mmHg Patient Gender: M              HR:           54 bpm. Exam Location:  Inpatient Procedure: 2D Echo, Cardiac Doppler and Color Doppler (Both Spectral and Color            Flow Doppler were utilized during procedure). Indications:    Syncope R55  History:        Patient has no prior history of Echocardiogram examinations.                 Risk Factors:Hypertension, Diabetes and Former Smoker.  Sonographer:    Jayson Gaskins Referring Phys: [id number] JUSTIN B HOWERTER IMPRESSIONS  1. Calcified chords. Left ventricular ejection fraction, by estimation, is 55 to 60%. The left ventricle has normal function. The left ventricle has no regional wall motion abnormalities. Left ventricular diastolic parameters were normal.  2. Right ventricular systolic function is normal. The right ventricular size is normal.  3. Left atrial size was mildly dilated.  4. The mitral valve is grossly normal. Trivial mitral valve regurgitation.  5. The aortic valve is tricuspid. Aortic valve  regurgitation is not visualized. Aortic valve sclerosis is present, with no evidence of aortic valve stenosis. Comparison(s): No prior Echocardiogram. FINDINGS  Left Ventricle: Calcified chords. Left ventricular ejection fraction, by estimation, is 55 to 60%. The left ventricle has normal function. The left ventricle has no regional wall motion abnormalities. The left ventricular internal cavity size was normal  in size. There is no left ventricular hypertrophy. Left ventricular diastolic parameters were normal. Right Ventricle: The right ventricular size is normal. No increase in right ventricular wall thickness. Right ventricular systolic function is normal. Left Atrium: Left atrial size was mildly dilated. Right Atrium: Right atrial size was normal in size. Pericardium: There is no evidence of pericardial effusion. Mitral Valve: The mitral valve is grossly normal. Mild mitral annular calcification. Trivial mitral valve regurgitation. Tricuspid Valve: The tricuspid valve  is grossly normal. Tricuspid valve regurgitation is trivial. Aortic Valve: The aortic valve is tricuspid. Aortic valve regurgitation is not visualized. Aortic valve sclerosis is present, with no evidence of aortic valve stenosis. Aortic valve mean gradient measures 4.0 mmHg. Aortic valve peak gradient measures 6.0  mmHg. Aortic valve area, by VTI measures 2.80 cm. Pulmonic Valve: The pulmonic valve was normal in structure. Pulmonic valve regurgitation is trivial. No evidence of pulmonic stenosis. Aorta: The aortic root is normal in size and structure. Venous: The inferior vena cava was not well visualized. IAS/Shunts: The interatrial septum was not well visualized.  LEFT VENTRICLE PLAX 2D LVIDd:         4.70 cm   Diastology LVIDs:         2.70 cm   LV e' medial:    8.70 cm/s LV PW:         1.10 cm   LV E/e' medial:  12.8 LV IVS:        0.90 cm   LV e' lateral:   8.27 cm/s LVOT diam:     1.90 cm   LV E/e' lateral: 13.4 LV SV:         84 LV SV  Index:   43 LVOT Area:     2.84 cm  RIGHT VENTRICLE RV S prime:     11.70 cm/s TAPSE (M-mode): 2.1 cm LEFT ATRIUM             Index        RIGHT ATRIUM           Index LA Vol (A2C):   41.4 ml 20.87 ml/m  RA Area:     10.30 cm LA Vol (A4C):   66.0 ml 33.27 ml/m  RA Volume:   15.90 ml  8.01 ml/m LA Biplane Vol: 55.7 ml 28.07 ml/m  AORTIC VALVE AV Area (Vmax):    2.93 cm AV Area (Vmean):   2.62 cm AV Area (VTI):     2.80 cm AV Vmax:           122.00 cm/s AV Vmean:          95.900 cm/s AV VTI:            0.302 m AV Peak Grad:      6.0 mmHg AV Mean Grad:      4.0 mmHg LVOT Vmax:         126.00 cm/s LVOT Vmean:        88.700 cm/s LVOT VTI:          0.298 m LVOT/AV VTI ratio: 0.99  AORTA Ao Root diam: 2.70 cm MITRAL VALVE MV Area (PHT): 3.26 cm     SHUNTS MV Decel Time: 233 msec     Systemic VTI:  0.30 m MV E velocity: 111.00 cm/s  Systemic Diam: 1.90 cm MV A velocity: 105.00 cm/s MV E/A ratio:  1.06 Stanly Leavens MD Electronically signed by Stanly Leavens MD Signature Date/Time: 10/31/2023/2:15:56 PM    Final    CT Angio Chest PE W and/or Wo Contrast Result Date: 10/30/2023 EXAM: CTA CHEST PE WITHOUT AND WITH CONTRAST CT ABDOMEN AND PELVIS WITHOUT AND WITH CONTRAST 10/30/2023 10:15:48 PM TECHNIQUE: CTA of the chest was performed after the administration of intravenous contrast. Multiplanar reformatted images are provided for review. MIP images are provided for review. CT of the abdomen and pelvis was performed with the administration of intravenous contrast. Automated exposure control, iterative reconstruction, and/or weight based adjustment of the mA/kV  was utilized to reduce the radiation dose to as low as reasonably achievable. COMPARISON: CT chest abdomen and pelvis dated 06/22/2023. CLINICAL HISTORY: Rule out PE. Chest pain, dizziness, sepsis workup. N/V. eval for evidence of obstruction/ileus. FINDINGS: CHEST: PULMONARY ARTERIES: Pulmonary arteries are adequately opacified for evaluation. No  intraluminal filling defect to suggest pulmonary embolism. Main pulmonary artery is normal in caliber. MEDIASTINUM: No mediastinal lymphadenopathy. The heart and pericardium demonstrate no acute abnormality. There is no acute abnormality of the thoracic aorta. Thoracic aortic atherosclerosis. LUNGS AND PLEURA: 6 mm subpleural left lower lobe nodule (image 67), previously 7 mm, likely benign. Additional peribronchovascular nodularity at the posterior left lung base (image 94), chronic. Radiation changes in the medial left upper lobe/perihilar region. No focal consolidation or pulmonary edema. No pleural effusion or pneumothorax. SOFT TISSUES AND BONES: No acute bone or soft tissue abnormality. Right chest port terminates at the cavoatrial junction. ABDOMEN AND PELVIS: LIVER: Unremarkable. GALLBLADDER AND BILE DUCTS: Gallbladder is unremarkable. No biliary ductal dilatation. SPLEEN: Spleen demonstrates no acute abnormality. PANCREAS: Pancreas demonstrates no acute abnormality. ADRENAL GLANDS: Adrenal glands demonstrate no acute abnormality. KIDNEYS, URETERS AND BLADDER: No stones in the kidneys or ureters. No hydronephrosis. No perinephric or periureteral stranding. Urinary bladder is unremarkable. GI AND BOWEL: Mildly thick-walled loops of small bowel in the left mid abdomen with mild mesenteric stranding (image 44), raising concern for infectious/inflammatory enteritis. No pneumatosis or free air. Sigmoid diverticulosis, without evidence of diverticulitis. Normal appendix (image 48). REPRODUCTIVE: Brachytherapy seeds along the prostate. PERITONEUM AND RETROPERITONEUM: Small volume abdominopelvic ascites, predominantly perisplenic. LYMPH NODES: No lymphadenopathy. BONES AND SOFT TISSUES: No acute abnormality of the visualized bones. Tiny fat containing left inguinal hernia. Atherosclerotic calcifications of the abdominal aorta and branch vessels, although patent. IMPRESSION: 1. No evidence of pulmonary embolism. 2.  Mild small bowel wall thickening with mesenteric stranding in the left mid abdomen, suggesting infectious/inflammatory enteritis. No pneumatosis or free air. 3. Radiation changes in the left hemithorax. No findings suspicious for recurrent or metastatic disease. 4. Additional ancillary findings as above. Electronically signed by: Pinkie Pebbles MD 10/30/2023 10:32 PM EDT RP Workstation: [id number]   CT ABDOMEN PELVIS W CONTRAST Result Date: 10/30/2023 EXAM: CTA CHEST PE WITHOUT AND WITH CONTRAST CT ABDOMEN AND PELVIS WITHOUT AND WITH CONTRAST 10/30/2023 10:15:48 PM TECHNIQUE: CTA of the chest was performed after the administration of intravenous contrast. Multiplanar reformatted images are provided for review. MIP images are provided for review. CT of the abdomen and pelvis was performed with the administration of intravenous contrast. Automated exposure control, iterative reconstruction, and/or weight based adjustment of the mA/kV was utilized to reduce the radiation dose to as low as reasonably achievable. COMPARISON: CT chest abdomen and pelvis dated 06/22/2023. CLINICAL HISTORY: Rule out PE. Chest pain, dizziness, sepsis workup. N/V. eval for evidence of obstruction/ileus. FINDINGS: CHEST: PULMONARY ARTERIES: Pulmonary arteries are adequately opacified for evaluation. No intraluminal filling defect to suggest pulmonary embolism. Main pulmonary artery is normal in caliber. MEDIASTINUM: No mediastinal lymphadenopathy. The heart and pericardium demonstrate no acute abnormality. There is no acute abnormality of the thoracic aorta. Thoracic aortic atherosclerosis. LUNGS AND PLEURA: 6 mm subpleural left lower lobe nodule (image 67), previously 7 mm, likely benign. Additional peribronchovascular nodularity at the posterior left lung base (image 94), chronic. Radiation changes in the medial left upper lobe/perihilar region. No focal consolidation or pulmonary edema. No pleural effusion or pneumothorax. SOFT TISSUES  AND BONES: No acute bone or soft tissue abnormality. Right chest port terminates at the  cavoatrial junction. ABDOMEN AND PELVIS: LIVER: Unremarkable. GALLBLADDER AND BILE DUCTS: Gallbladder is unremarkable. No biliary ductal dilatation. SPLEEN: Spleen demonstrates no acute abnormality. PANCREAS: Pancreas demonstrates no acute abnormality. ADRENAL GLANDS: Adrenal glands demonstrate no acute abnormality. KIDNEYS, URETERS AND BLADDER: No stones in the kidneys or ureters. No hydronephrosis. No perinephric or periureteral stranding. Urinary bladder is unremarkable. GI AND BOWEL: Mildly thick-walled loops of small bowel in the left mid abdomen with mild mesenteric stranding (image 44), raising concern for infectious/inflammatory enteritis. No pneumatosis or free air. Sigmoid diverticulosis, without evidence of diverticulitis. Normal appendix (image 48). REPRODUCTIVE: Brachytherapy seeds along the prostate. PERITONEUM AND RETROPERITONEUM: Small volume abdominopelvic ascites, predominantly perisplenic. LYMPH NODES: No lymphadenopathy. BONES AND SOFT TISSUES: No acute abnormality of the visualized bones. Tiny fat containing left inguinal hernia. Atherosclerotic calcifications of the abdominal aorta and branch vessels, although patent. IMPRESSION: 1. No evidence of pulmonary embolism. 2. Mild small bowel wall thickening with mesenteric stranding in the left mid abdomen, suggesting infectious/inflammatory enteritis. No pneumatosis or free air. 3. Radiation changes in the left hemithorax. No findings suspicious for recurrent or metastatic disease. 4. Additional ancillary findings as above. Electronically signed by: Pinkie Pebbles MD 10/30/2023 10:32 PM EDT RP Workstation: [id number]   DG Chest Port 1 View Result Date: 10/30/2023 CLINICAL DATA:  Chest pain and dizziness. Central chest pain radiating to both sides. EXAM: PORTABLE CHEST 1 VIEW COMPARISON:  CT chest 06/22/2023 FINDINGS: Tracheostomy tube with tip measuring 4.7  cm above the carina. Power port type central venous catheter with tip over the cavoatrial junction region. No pneumothorax. Shallow inspiration. Mild cardiac enlargement. No vascular congestion or edema. Mild left perihilar and basilar infiltration may represent pneumonia or atelectasis. Probable small left pleural effusion. Calcification of the aorta. Degenerative changes in the spine and shoulders. IMPRESSION: Probable small left pleural effusion with infiltration or atelectasis in the left mid and lower lung zone, likely pneumonia. Electronically Signed   By: Elsie Gravely M.D.   On: 10/30/2023 19:17   (Echo, Carotid, EGD, Colonoscopy, ERCP)    Subjective: No complaints feels great  Discharge Exam: Vitals:   11/03/23 0730 11/03/23 0823  BP: (!) 128/54   Pulse: (!) 48 62  Resp: 16 16  Temp: 98 F (36.7 C)   SpO2: 97% 98%   Vitals:   11/03/23 0451 11/03/23 0533 11/03/23 0730 11/03/23 0823  BP:   (!) 128/54   Pulse: (!) 47  (!) 48 62  Resp: 14  16 16   Temp:   98 F (36.7 C)   TempSrc:   Oral   SpO2: 95%  97% 98%  Weight:  80.7 kg    Height:        General: Pt is alert, awake, not in acute distress Cardiovascular: RRR, S1/S2 +, no rubs, no gallops Respiratory: CTA bilaterally, no wheezing, no rhonchi Abdominal: Soft, NT, ND, bowel sounds + Extremities: no edema, no cyanosis    The results of significant diagnostics from this hospitalization (including imaging, microbiology, ancillary and laboratory) are listed below for reference.     Microbiology: Recent Results (from the past 240 hours)  Blood culture (routine x 2)     Status: None (Preliminary result)   Collection Time: 10/30/23  8:20 PM   Specimen: BLOOD RIGHT ARM  Result Value Ref Range Status   Specimen Description BLOOD RIGHT ARM  Final   Special Requests   Final    BOTTLES DRAWN AEROBIC AND ANAEROBIC Blood Culture adequate volume   Culture  Final    NO GROWTH 4 DAYS Performed at [facility],  [address], [place], [place] [address]    Report Status PENDING  Incomplete  Blood culture (routine x 2)     Status: None (Preliminary result)   Collection Time: 10/30/23  9:20 PM   Specimen: BLOOD LEFT ARM  Result Value Ref Range Status   Specimen Description BLOOD LEFT ARM  Final   Special Requests   Final    BOTTLES DRAWN AEROBIC AND ANAEROBIC Blood Culture adequate volume   Culture   Final    NO GROWTH 4 DAYS Performed at [facility], [address], [place], [place] [address]    Report Status PENDING  Incomplete     Labs: BNP (last 3 results) Recent Labs    10/31/23 0453  BNP 69.2   Basic Metabolic Panel: Recent Labs  Lab 10/30/23 1829 10/30/23 1926 10/30/23 1951 10/31/23 0453 11/01/23 0755 11/02/23 0802  NA 137  --  140 136 137  --   K 3.6  --  3.7 3.7 3.8  --   CL 104  --  107 108 108  --   CO2 20*  --   --  21* 22  --   GLUCOSE 99  --  103* 101* 86  --   BUN 23  --  25* 18 11  --   CREATININE 1.71*  --  1.80* 1.45* 1.31*  --   CALCIUM 8.8*  --   --  8.0* 8.1*  --   MG  --  1.8  --  1.6* 1.8 2.0   Liver Function Tests: Recent Labs  Lab 10/30/23 1829 10/31/23 0453  AST 20 15  ALT 13 12  ALKPHOS 45 40  BILITOT 1.2 0.9  PROT 7.4 6.4*  ALBUMIN 3.1* 2.7*   Recent Labs  Lab 10/30/23 1829  LIPASE 34   No results for input(s): AMMONIA in the last 168 hours. CBC: Recent Labs  Lab 10/30/23 1829 10/30/23 1951 10/31/23 0453 11/01/23 0755  WBC 12.0*  --  8.8 7.4  NEUTROABS  --   --  6.9  --   HGB 12.1* 12.6* 10.0* 9.8*  HCT 37.5* 37.0* 30.2* 29.9*  MCV 96.2  --  95.3 94.6  PLT 335  --  287 289   Cardiac Enzymes: No results for input(s): CKTOTAL, CKMB, CKMBINDEX, TROPONINI in the last 168 hours. BNP: Invalid input(s): POCBNP CBG: Recent Labs  Lab 11/02/23 1129 11/02/23 1704 11/02/23 2127 11/03/23 0529 11/03/23 0728  GLUCAP 83 111* 102* 89 89   D-Dimer No results for input(s): DDIMER in the last 72 hours. Hgb  A1c No results for input(s): HGBA1C in the last 72 hours. Lipid Profile No results for input(s): CHOL, HDL, LDLCALC, TRIG, CHOLHDL, LDLDIRECT in the last 72 hours. Thyroid  function studies No results for input(s): TSH, T4TOTAL, T3FREE, THYROIDAB in the last 72 hours.  Invalid input(s): FREET3 Anemia work up No results for input(s): VITAMINB12, FOLATE, FERRITIN, TIBC, IRON, RETICCTPCT in the last 72 hours. Urinalysis    Component Value Date/Time   COLORURINE YELLOW 04/20/2020 0255   APPEARANCEUR HAZY (A) 04/20/2020 0255   LABSPEC 1.018 04/20/2020 0255   LABSPEC 1.010 09/18/2019 1406   PHURINE 5.0 04/20/2020 0255   GLUCOSEU NEGATIVE 04/20/2020 0255   HGBUR NEGATIVE 04/20/2020 0255   BILIRUBINUR NEGATIVE 04/20/2020 0255   BILIRUBINUR negative 09/18/2019 1406   BILIRUBINUR n 06/13/2011 0931   KETONESUR 5 (A) 04/20/2020 0255  PROTEINUR NEGATIVE 04/20/2020 0255   UROBILINOGEN negative 06/13/2011 0931   NITRITE NEGATIVE 04/20/2020 0255   LEUKOCYTESUR NEGATIVE 04/20/2020 0255   Sepsis Labs Recent Labs  Lab 10/30/23 1829 10/31/23 0453 11/01/23 0755  WBC 12.0* 8.8 7.4   Microbiology Recent Results (from the past 240 hours)  Blood culture (routine x 2)     Status: None (Preliminary result)   Collection Time: 10/30/23  8:20 PM   Specimen: BLOOD RIGHT ARM  Result Value Ref Range Status   Specimen Description BLOOD RIGHT ARM  Final   Special Requests   Final    BOTTLES DRAWN AEROBIC AND ANAEROBIC Blood Culture adequate volume   Culture   Final    NO GROWTH 4 DAYS Performed at [facility], [address], [place], [place] [address]    Report Status PENDING  Incomplete  Blood culture (routine x 2)     Status: None (Preliminary result)   Collection Time: 10/30/23  9:20 PM   Specimen: BLOOD LEFT ARM  Result Value Ref Range Status   Specimen Description BLOOD LEFT ARM  Final   Special Requests   Final    BOTTLES DRAWN AEROBIC AND  ANAEROBIC Blood Culture adequate volume   Culture   Final    NO GROWTH 4 DAYS Performed at [facility], [address], [place], [place] [address]    Report Status PENDING  Incomplete     Time coordinating discharge: Over 35 minutes  SIGNED:   Erle Odell Castor, MD  [facility] 11/03/2023, 9:36 AM Pager   If 7PM-7AM, please contact night-coverage [email or web address] Password [email or web address]

## 2023-11-03 NOTE — TOC Transition Note (Signed)
 Transition of Care [facility]) - Discharge Note   Patient Details  Name: Shaun Kennedy MRN: [id number] Date of Birth: 08/19/1940  [facility] CM/SW Contact:  Sudie Erminio Deems, RN Phone Number: 11/03/2023, 10:39 AM   Clinical Narrative: Patient will discharge home today. RT states daughter has asked for an aerosol trach collar for humidity. Inpatient Case Manager did submit the referral via EPIC to [employer]. DME will be delivered to the home. Patient's son will provide transportation home via private vehicle. No further needs identified at this time.    Final next level of care: Home/Self Care Barriers to Discharge: No Barriers Identified   Patient Goals and CMS Choice Patient states their goals for this hospitalization and ongoing recovery are:: Plans to return to playing golf.   Choice offered to / list presented to : NA  Discharge Plan and Services Additional resources added to the After Visit Summary for   In-house Referral: NA Discharge Planning Services: CM Consult Post Acute Care Choice: NA            DME Agency: NA       HH Arranged: NA  Social Drivers of Health (SDOH) Interventions SDOH Screenings   Food Insecurity: No Food Insecurity (10/31/2023)  Housing: Low Risk  (10/31/2023)  Transportation Needs: No Transportation Needs (10/31/2023)  Utilities: Not At Risk (10/31/2023)  Alcohol  Screen: Low Risk  (04/25/2023)  Depression (PHQ2-9): Low Risk  (10/26/2023)  Financial Resource Strain: Low Risk  (04/25/2023)  Physical Activity: Sufficiently Active (04/25/2023)  Social Connections: Moderately Isolated (10/31/2023)  Stress: No Stress Concern Present (04/25/2023)  Tobacco Use: Medium Risk (10/31/2023)  Health Literacy: Adequate Health Literacy (04/25/2023)   Readmission Risk Interventions     No data to display

## 2023-11-03 NOTE — Care Management (Signed)
    Durable Medical Equipment  (From admission, onward)           Start     Ordered   11/03/23 1028  For home use only DME Other see comment  Once       Comments: aerosol trach collar humidity at home.  Question:  Length of Need  Answer:  Lifetime   11/03/23 1028          Per family he sounds junky/congested at home sometimes and she feels this would help.

## 2023-11-03 NOTE — Plan of Care (Signed)

## 2023-11-04 LAB — CULTURE, BLOOD (ROUTINE X 2)
Culture: NO GROWTH
Culture: NO GROWTH
Special Requests: ADEQUATE
Special Requests: ADEQUATE

## 2023-11-06 ENCOUNTER — Telehealth: Payer: Self-pay

## 2023-11-06 NOTE — Transitions of Care (Post Inpatient/ED Visit) (Signed)
   11/06/2023  Name: Shaun Kennedy MRN: [id number] DOB: [birth year]  Today's TOC FU Call Status: Today's TOC FU Call Status:: Unsuccessful Call (1st Attempt) Unsuccessful Call (1st Attempt) Date: 11/06/23  Attempted to reach the patient regarding the most recent Inpatient/ED visit.  Follow Up Plan: Additional outreach attempts will be made to reach the patient to complete the Transitions of Care (Post Inpatient/ED visit) call.   Medford Balboa, BSN, RN [place]  VBCI - [employer] [facility] RN Care Manager [phone]

## 2023-11-06 NOTE — Transitions of Care (Post Inpatient/ED Visit) (Signed)
 11/06/2023  Name: Shaun Kennedy MRN: [id number] DOB: 07/10/1940  Today's TOC FU Call Status: Today's TOC FU Call Status:: Successful TOC FU Call Completed TOC FU Call Complete Date: 11/06/23 Patient's Name and Date of Birth confirmed.  Transition Care Management Follow-up Telephone Call Date of Discharge: 11/03/23 Discharge Facility: [facility]) Type of Discharge: Inpatient Admission Primary Inpatient Discharge Diagnosis:: Pneumonia How have you been since you were released from the hospital?: Better Any questions or concerns?: No  Items Reviewed: Did you receive and understand the discharge instructions provided?: Yes Medications obtained,verified, and reconciled?: Yes (Medications Reviewed) Any new allergies since your discharge?: No Dietary orders reviewed?: Yes Type of Diet Ordered:: Low sodium Heart Healthy Do you have support at home?: Yes People in Home [RPT]: spouse Name of Support/Comfort Primary Source: Shaun Kennedy  Medications Reviewed Today: Medications Reviewed Today     Reviewed by Shaun Reusing, RN (Case Manager) on 11/06/23 at 1504  Med List Status: <None>   Medication Order Taking? Sig Documenting Provider Last Dose Status Informant  amLODipine  (NORVASC ) 5 MG tablet [id number] No TAKE 1 TABLET (5 MG TOTAL) BY MOUTH DAILY. Shaun Norleen BROCKS, MD 10/30/2023 Morning Active Self, Pharmacy Records  amoxicillin -clavulanate (AUGMENTIN ) 875-125 MG tablet [id number]  Take 1 tablet by mouth every 12 (twelve) hours for 5 days. Shaun Celinda Balo, MD  Active   aspirin  EC 81 MG tablet [id number] No Take 81 mg by mouth daily after breakfast.  [provider] 10/30/2023 Morning Active Self, Pharmacy Records  cholecalciferol  (VITAMIN D ) 25 MCG (1000 UNIT) tablet [id number] No Take 1,000 Units by mouth daily after breakfast. [provider] 10/30/2023 Active Self, Pharmacy Records  donepezil  (ARICEPT ) 10 MG tablet [id number] No Take 1 tablet (10 mg total) by  mouth at bedtime. Shaun Norleen BROCKS, MD 10/29/2023 Active Self, Pharmacy Records  ferrous sulfate  325 (65 FE) MG EC tablet [id number] No TAKE 1 TABLET EVERY DAY WITH BREAKFAST  Patient taking differently: Take 325 mg by mouth daily after breakfast.   Shaun Norleen BROCKS, MD 10/30/2023 Morning Active Self, Pharmacy Records  lidocaine -prilocaine  (EMLA ) cream [id number] No Apply 1 Application topically as needed. Shaun Sherrod, MD Past Month Active Self, Pharmacy Records           Med Note (LEE, NICOLE   Tue Oct 31, 2023  1:15 AM)    lisinopril -hydrochlorothiazide  (ZESTORETIC ) 20-12.5 MG tablet [id number] No Take 1 tablet by mouth daily. Lalonde, Shaun C, MD 10/30/2023 Morning Active Self, Pharmacy Records  metFORMIN  (GLUCOPHAGE -XR) 500 MG 24 hr tablet [id number] No Take 1 tablet (500 mg total) by mouth daily.  Patient taking differently: Take 500 mg by mouth daily after breakfast.   Shaun Norleen BROCKS, MD 10/30/2023 Morning Active Self, Pharmacy Records  Propylene Glycol (SYSTANE COMPLETE) 0.6 % SOLN [id number] No Place 1 drop into both eyes every morning. [provider] 10/30/2023 Morning Active Self, Pharmacy Records  simvastatin  (ZOCOR ) 40 MG tablet [id number] No Take 1 tablet (40 mg total) by mouth daily. Shaun Norleen BROCKS, MD 10/30/2023 Morning Active Self, Pharmacy Records  solifenacin  (VESICARE ) 5 MG tablet [id number] No Take 1 tablet (5 mg total) by mouth daily. Shaun Norleen BROCKS, MD 10/30/2023 Morning Active Self, Pharmacy Records  Med List Note Shaun Kennedy 10/31/23 0221): Dialyisis: [email or web address]  Long            Home Care and Equipment/Supplies: Were Home Health Services Ordered?: No Any new equipment or medical supplies ordered?: Yes Name of Medical supply agency?: [employer] Were you  able to get the equipment/medical supplies?: Yes Do you have any questions related to the use of the equipment/supplies?: No  Functional Questionnaire: Do you need assistance with bathing/showering or dressing?:  No Do you need assistance with meal preparation?: No Do you need assistance with eating?: No Do you have difficulty maintaining continence: No Do you need assistance with getting out of bed/getting out of a chair/moving?: No Do you have difficulty managing or taking your medications?: No  Follow up appointments reviewed: PCP Follow-up appointment confirmed?: Yes Date of PCP follow-up appointment?: 11/08/23 Follow-up Provider: Norleen Jobs Specialist [facility] Follow-up appointment confirmed?: Yes Date of Specialist follow-up appointment?: 12/18/23 Follow-Up Specialty Provider:: Dr. Vita Do you need transportation to your follow-up appointment?: No Do you understand care options if your condition(s) worsen?: Yes-patient verbalized understanding  SDOH Interventions Today    Flowsheet Row Most Recent Value  SDOH Interventions   Food Insecurity Interventions Intervention Not Indicated  Housing Interventions Intervention Not Indicated  Transportation Interventions Intervention Not Indicated  Utilities Interventions Intervention Not Indicated   Medford Balboa, BSN, RN [place]  VBCI - [facility] RN Care Manager [phone]

## 2023-11-08 ENCOUNTER — Encounter: Payer: Self-pay | Admitting: Family Medicine

## 2023-11-08 ENCOUNTER — Ambulatory Visit (INDEPENDENT_AMBULATORY_CARE_PROVIDER_SITE_OTHER): Admitting: Family Medicine

## 2023-11-08 VITALS — BP 110/60 | HR 53 | Ht 68.0 in | Wt 186.2 lb

## 2023-11-08 DIAGNOSIS — C3432 Malignant neoplasm of lower lobe, left bronchus or lung: Secondary | ICD-10-CM

## 2023-11-08 DIAGNOSIS — R55 Syncope and collapse: Secondary | ICD-10-CM

## 2023-11-08 DIAGNOSIS — Z23 Encounter for immunization: Secondary | ICD-10-CM | POA: Diagnosis not present

## 2023-11-08 DIAGNOSIS — N179 Acute kidney failure, unspecified: Secondary | ICD-10-CM

## 2023-11-08 DIAGNOSIS — J189 Pneumonia, unspecified organism: Secondary | ICD-10-CM

## 2023-11-08 NOTE — Progress Notes (Signed)
   Subjective:    Patient ID: Shaun Kennedy, male    DOB: October 11, 1940, 83 y.o.   MRN: [id number]  Discussed the use of AI scribe software for clinical note transcription with the patient, who gave verbal consent to proceed.  History of Present Illness   Shaun Kennedy is an 83 year old male who presents for follow-up after recent hospitalization for fainting, pneumonia, and acute kidney issues.  He was hospitalized from September 22 to September 26 due to a fainting spell. During the hospitalization, he was diagnosed with pneumonia and acute kidney issues. His blood pressure medications, amlodipine  and lisinopril , were held due to low blood pressure readings.  He is currently taking Augmentin  for pneumonia, which he describes as a 'big horse pill.' He is expected to complete this course by tomorrow. No chest pain, shortness of breath, fever, chills, or cough.  He states that he is now back to normal.  Regarding his kidney function, it was noted to be decreased during his hospitalization. During his hospitalization, he was found to have an intermittent heart block.   He was evaluated by cardiology and it was felt the cardiac condition was because of the infectious etiology.       Review of Systems     Objective:    Physical Exam Physical Exam   VITALS: BP- 110/60 CHEST: Lungs clear to auscultation. CARDIOVASCULAR: Heart regular rhythm.      The medical record including discharge summary and lab and x-rays was evaluated.      Assessment & Plan:  Assessment and Plan    Pneumonia, unspecified organism Recent hospitalization for pneumonia. Currently asymptomatic. - Complete current course of Augmentin .  Acute kidney injury Acute kidney injury noted with slightly decreased kidney function. Emphasized hydration. - Ensure adequate hydration. - Order blood work to check kidney function and blood count.  Hypertension, currently not on medication Blood pressure 110/60 mmHg.  Antihypertensives held due to low blood pressure. - Hold amlodipine  and lisinopril  until further notice.     Return here in 1 month for blood pressure recheck.

## 2023-11-08 NOTE — Patient Instructions (Signed)
 Hold the amlodipine  and the lisinopril  until I see you in a month.  Thing else

## 2023-11-09 DIAGNOSIS — Z930 Tracheostomy status: Secondary | ICD-10-CM | POA: Diagnosis not present

## 2023-11-09 LAB — CBC WITH DIFFERENTIAL/PLATELET
Basophils Absolute: 0 x10E3/uL (ref 0.0–0.2)
Basos: 1 %
EOS (ABSOLUTE): 0.4 x10E3/uL (ref 0.0–0.4)
Eos: 6 %
Hematocrit: 30.5 % — ABNORMAL LOW (ref 37.5–51.0)
Hemoglobin: 10.3 g/dL — ABNORMAL LOW (ref 13.0–17.7)
Immature Grans (Abs): 0 x10E3/uL (ref 0.0–0.1)
Immature Granulocytes: 0 %
Lymphocytes Absolute: 1.3 x10E3/uL (ref 0.7–3.1)
Lymphs: 18 %
MCH: 31.7 pg (ref 26.6–33.0)
MCHC: 33.8 g/dL (ref 31.5–35.7)
MCV: 94 fL (ref 79–97)
Monocytes Absolute: 0.6 x10E3/uL (ref 0.1–0.9)
Monocytes: 8 %
Neutrophils Absolute: 4.7 x10E3/uL (ref 1.4–7.0)
Neutrophils: 67 %
Platelets: 291 x10E3/uL (ref 150–450)
RBC: 3.25 x10E6/uL — ABNORMAL LOW (ref 4.14–5.80)
RDW: 12.4 % (ref 11.6–15.4)
WBC: 7 x10E3/uL (ref 3.4–10.8)

## 2023-11-09 LAB — COMPREHENSIVE METABOLIC PANEL WITH GFR
ALT: 16 IU/L (ref 0–44)
AST: 21 IU/L (ref 0–40)
Albumin: 3.7 g/dL (ref 3.7–4.7)
Alkaline Phosphatase: 60 IU/L (ref 48–129)
BUN/Creatinine Ratio: 13 (ref 10–24)
BUN: 24 mg/dL (ref 8–27)
Bilirubin Total: 0.5 mg/dL (ref 0.0–1.2)
CO2: 21 mmol/L (ref 20–29)
Calcium: 9.4 mg/dL (ref 8.6–10.2)
Chloride: 103 mmol/L (ref 96–106)
Creatinine, Ser: 1.8 mg/dL — AB (ref 0.76–1.27)
Globulin, Total: 3.9 g/dL (ref 1.5–4.5)
Glucose: 84 mg/dL (ref 70–99)
Potassium: 4.3 mmol/L (ref 3.5–5.2)
Sodium: 138 mmol/L (ref 134–144)
Total Protein: 7.6 g/dL (ref 6.0–8.5)
eGFR: 36 mL/min/1.73 — AB (ref >59)

## 2023-11-10 ENCOUNTER — Ambulatory Visit: Payer: Self-pay | Admitting: Family Medicine

## 2023-11-13 DIAGNOSIS — H0100A Unspecified blepharitis right eye, upper and lower eyelids: Secondary | ICD-10-CM | POA: Diagnosis not present

## 2023-11-13 DIAGNOSIS — H0100B Unspecified blepharitis left eye, upper and lower eyelids: Secondary | ICD-10-CM | POA: Diagnosis not present

## 2023-11-13 DIAGNOSIS — B8809 Other acariasis: Secondary | ICD-10-CM | POA: Diagnosis not present

## 2023-11-13 DIAGNOSIS — H04123 Dry eye syndrome of bilateral lacrimal glands: Secondary | ICD-10-CM | POA: Diagnosis not present

## 2023-11-13 LAB — OPHTHALMOLOGY REPORT-SCANNED

## 2023-11-17 ENCOUNTER — Telehealth

## 2023-11-30 ENCOUNTER — Telehealth: Payer: Self-pay

## 2023-11-30 NOTE — Patient Instructions (Signed)
 Shaun Kennedy - I am sorry I was unable to reach you today for our scheduled appointment. I work with [name], MD and am calling to support your healthcare needs. Please contact me at [phone] at your earliest convenience. I look forward to speaking with you soon.   Thank you,   Clayborne Ly RN BSN CCM   [facility], [facility] Nurse Care Coordinator  Direct Dial: [phone] Website: [email or web address]

## 2023-11-30 NOTE — Telephone Encounter (Signed)
 Reviewed note form that visit I didn't see anything about this.    Copied from CRM [id number]. Topic: Clinical - Medication Question >> Nov 30, 2023  8:21 AM Darshell M wrote: Reason for CRM: Patient needs name of medication provider suggested for the swollen finger during 10/1 visit. CB# [phone].

## 2023-12-05 ENCOUNTER — Encounter: Payer: Self-pay | Admitting: Family Medicine

## 2023-12-05 ENCOUNTER — Ambulatory Visit: Admitting: Family Medicine

## 2023-12-05 ENCOUNTER — Ambulatory Visit: Payer: Self-pay

## 2023-12-05 VITALS — BP 140/68 | HR 61 | Ht 68.0 in | Wt 189.6 lb

## 2023-12-05 DIAGNOSIS — C3432 Malignant neoplasm of lower lobe, left bronchus or lung: Secondary | ICD-10-CM

## 2023-12-05 DIAGNOSIS — Z930 Tracheostomy status: Secondary | ICD-10-CM | POA: Diagnosis not present

## 2023-12-05 DIAGNOSIS — I152 Hypertension secondary to endocrine disorders: Secondary | ICD-10-CM

## 2023-12-05 DIAGNOSIS — E1159 Type 2 diabetes mellitus with other circulatory complications: Secondary | ICD-10-CM | POA: Diagnosis not present

## 2023-12-05 DIAGNOSIS — R531 Weakness: Secondary | ICD-10-CM | POA: Diagnosis not present

## 2023-12-05 MED ORDER — DONEPEZIL HCL 10 MG PO TABS: 10.0000 mg | ORAL_TABLET | Freq: Every day | ORAL | 1 refills | Status: DC

## 2023-12-05 MED ORDER — AMLODIPINE BESYLATE 5 MG PO TABS: 5.0000 mg | ORAL_TABLET | Freq: Every day | ORAL | 3 refills | Status: AC

## 2023-12-05 MED ORDER — FERROUS SULFATE 325 (65 FE) MG PO TBEC: 325.0000 mg | DELAYED_RELEASE_TABLET | Freq: Every day | ORAL | 3 refills | Status: AC

## 2023-12-05 NOTE — Telephone Encounter (Signed)
 FYI Only or Action Required?: FYI only for provider.  Patient was last seen in primary care on 11/08/2023 by [name], MD.  Called Nurse Triage reporting Shortness of Breath - stopping between words to breathe.  Symptoms began 5-6 months.  Interventions attempted: Nothing.  Symptoms are: stable.  Triage Disposition: See HCP Within 4 Hours (Or PCP Triage)  Patient/caregiver understands and will follow disposition?: Yes - pt states that this difficulty breathing is his normal.                      Copied from CRM #[id number]. Topic: Clinical - Red Word Triage >> Dec 05, 2023 11:03 AM Gustabo D wrote: Pt sounds like he is having trouble breathing he wants a appt with his pcp Reason for Disposition  [1] Longstanding difficulty breathing (e.g., CHF, COPD, emphysema) AND [2] WORSE than normal  Answer Assessment - Initial Assessment Questions 1. RESPIRATORY STATUS: Describe your breathing? (e.g., wheezing, shortness of breath, unable to speak, severe coughing)      Pt needs to pause between words to breath 2. ONSET: When did this breathing problem begin?      5-6 months 3. PATTERN Does the difficult breathing come and go, or has it been constant since it started?      constant 4. SEVERITY: How bad is your breathing? (e.g., mild, moderate, severe)      moderate 5. RECURRENT SYMPTOM: Have you had difficulty breathing before? If Yes, ask: When was the last time? and What happened that time?      Ongoing  for months 6. CARDIAC HISTORY: Do you have any history of heart disease? (e.g., heart attack, angina, bypass surgery, angioplasty)      HTN 7. LUNG HISTORY: Do you have any history of lung disease?  (e.g., pulmonary embolus, asthma, emphysema)     Ca lung 8. CAUSE: What do you think is causing the breathing problem?      Unsure - has been like this for several months 9. OTHER SYMPTOMS: Do you have any other symptoms? (e.g., chest pain, cough,  dizziness, fever, runny nose)     Did not ask  Protocols used: Breathing Difficulty-A-AH

## 2023-12-05 NOTE — Progress Notes (Signed)
   Subjective:    Patient ID: Shaun Kennedy, male    DOB: May 31, 1940, 83 y.o.   MRN: [id number]  Discussed the use of AI scribe software for clinical note transcription with the patient, who gave verbal consent to proceed.  History of Present Illness   Shaun Kennedy is an 83 year old male with a history of lung cancer and asthma who presents with breathing difficulties.  He experiences ongoing breathing difficulties, which he attributes to a 'vocal cord issue' and a 'tracheal thing.' He has a history of asthma  . Occasionally, he removes a tube to get relief from a cough, which occurs intermittently.  He describes a sensation of having 'junk stuff' in his airway, requiring him to remove the tube and use a device to suction it out. However, he currently lacks this device and is awaiting a new one. In the meantime, he uses a spray to loosen the secretions, which induces coughing to clear them.  He recalls previously using a machine to manage his symptoms, which was effective until his condition improved. However, he feels that his symptoms are 'starting back over again,' indicating a recurrence of the previous issues.  A CT scan of the chest, abdomen, and pelvis was performed on September 22, showing no evidence of pulmonary embolism but noting radiation changes on the left side.           Review of Systems     Objective:    Physical Exam Alert and in no distress.  Cardiac exam shows a regular rhythm without murmurs or gallops.  Lungs show coarse rhonchi throughout all lung fields.          Assessment & Plan:  Assessment and Plan    Tracheostomy with mucus retention and airway management issues Chronic tracheostomy with mucus retention. Awaiting suction device delivery. No significant changes since last CT scan. - Continue current airway management techniques until suction device arrives. - Ensure appointment with Dr. Carlie for further evaluation.  Lung cancer, status post  radiation Lung cancer treated with radiation. Recent CT scan showed no pulmonary embolus or significant changes, only radiation changes on the left side. - No additional imaging required at this time.     Several of his medicines need to be renewed.

## 2023-12-06 DIAGNOSIS — E1122 Type 2 diabetes mellitus with diabetic chronic kidney disease: Secondary | ICD-10-CM | POA: Diagnosis not present

## 2023-12-06 DIAGNOSIS — K219 Gastro-esophageal reflux disease without esophagitis: Secondary | ICD-10-CM | POA: Diagnosis not present

## 2023-12-06 DIAGNOSIS — I2510 Atherosclerotic heart disease of native coronary artery without angina pectoris: Secondary | ICD-10-CM | POA: Diagnosis not present

## 2023-12-06 DIAGNOSIS — I442 Atrioventricular block, complete: Secondary | ICD-10-CM | POA: Diagnosis not present

## 2023-12-06 DIAGNOSIS — Z792 Long term (current) use of antibiotics: Secondary | ICD-10-CM | POA: Diagnosis not present

## 2023-12-06 DIAGNOSIS — I129 Hypertensive chronic kidney disease with stage 1 through stage 4 chronic kidney disease, or unspecified chronic kidney disease: Secondary | ICD-10-CM | POA: Diagnosis not present

## 2023-12-06 DIAGNOSIS — Z85118 Personal history of other malignant neoplasm of bronchus and lung: Secondary | ICD-10-CM | POA: Diagnosis not present

## 2023-12-06 DIAGNOSIS — I700 Atherosclerosis of aorta: Secondary | ICD-10-CM | POA: Diagnosis not present

## 2023-12-06 DIAGNOSIS — M3500 Sicca syndrome, unspecified: Secondary | ICD-10-CM | POA: Diagnosis not present

## 2023-12-06 DIAGNOSIS — J4489 Other specified chronic obstructive pulmonary disease: Secondary | ICD-10-CM | POA: Diagnosis not present

## 2023-12-06 DIAGNOSIS — N1831 Chronic kidney disease, stage 3a: Secondary | ICD-10-CM | POA: Diagnosis not present

## 2023-12-06 DIAGNOSIS — Z930 Tracheostomy status: Secondary | ICD-10-CM | POA: Diagnosis not present

## 2023-12-06 DIAGNOSIS — Z818 Family history of other mental and behavioral disorders: Secondary | ICD-10-CM | POA: Diagnosis not present

## 2023-12-06 DIAGNOSIS — G3184 Mild cognitive impairment, so stated: Secondary | ICD-10-CM | POA: Diagnosis not present

## 2023-12-06 DIAGNOSIS — E1151 Type 2 diabetes mellitus with diabetic peripheral angiopathy without gangrene: Secondary | ICD-10-CM | POA: Diagnosis not present

## 2023-12-06 DIAGNOSIS — K760 Fatty (change of) liver, not elsewhere classified: Secondary | ICD-10-CM | POA: Diagnosis not present

## 2023-12-06 DIAGNOSIS — E785 Hyperlipidemia, unspecified: Secondary | ICD-10-CM | POA: Diagnosis not present

## 2023-12-18 ENCOUNTER — Encounter: Payer: Self-pay | Admitting: Family Medicine

## 2023-12-19 ENCOUNTER — Ambulatory Visit (HOSPITAL_COMMUNITY)
Admission: RE | Admit: 2023-12-19 | Discharge: 2023-12-19 | Disposition: A | Discharge status: Home / Self Care | Source: Ambulatory Visit | Attending: Internal Medicine | Admitting: Internal Medicine

## 2023-12-19 ENCOUNTER — Encounter (HOSPITAL_COMMUNITY): Payer: Self-pay

## 2023-12-19 ENCOUNTER — Inpatient Hospital Stay: Attending: Internal Medicine

## 2023-12-19 DIAGNOSIS — J45909 Unspecified asthma, uncomplicated: Secondary | ICD-10-CM | POA: Insufficient documentation

## 2023-12-19 DIAGNOSIS — N2882 Megaloureter: Secondary | ICD-10-CM | POA: Diagnosis not present

## 2023-12-19 DIAGNOSIS — Z87891 Personal history of nicotine dependence: Secondary | ICD-10-CM | POA: Diagnosis not present

## 2023-12-19 DIAGNOSIS — C3490 Malignant neoplasm of unspecified part of unspecified bronchus or lung: Secondary | ICD-10-CM | POA: Insufficient documentation

## 2023-12-19 DIAGNOSIS — Z85038 Personal history of other malignant neoplasm of large intestine: Secondary | ICD-10-CM | POA: Insufficient documentation

## 2023-12-19 DIAGNOSIS — Z452 Encounter for adjustment and management of vascular access device: Secondary | ICD-10-CM | POA: Insufficient documentation

## 2023-12-19 DIAGNOSIS — Z7982 Long term (current) use of aspirin: Secondary | ICD-10-CM | POA: Insufficient documentation

## 2023-12-19 DIAGNOSIS — E785 Hyperlipidemia, unspecified: Secondary | ICD-10-CM | POA: Diagnosis not present

## 2023-12-19 DIAGNOSIS — Z95828 Presence of other vascular implants and grafts: Secondary | ICD-10-CM

## 2023-12-19 DIAGNOSIS — E119 Type 2 diabetes mellitus without complications: Secondary | ICD-10-CM | POA: Insufficient documentation

## 2023-12-19 DIAGNOSIS — N2889 Other specified disorders of kidney and ureter: Secondary | ICD-10-CM | POA: Diagnosis not present

## 2023-12-19 DIAGNOSIS — Z85118 Personal history of other malignant neoplasm of bronchus and lung: Secondary | ICD-10-CM | POA: Insufficient documentation

## 2023-12-19 DIAGNOSIS — I10 Essential (primary) hypertension: Secondary | ICD-10-CM | POA: Insufficient documentation

## 2023-12-19 DIAGNOSIS — Z7984 Long term (current) use of oral hypoglycemic drugs: Secondary | ICD-10-CM | POA: Insufficient documentation

## 2023-12-19 DIAGNOSIS — Z79899 Other long term (current) drug therapy: Secondary | ICD-10-CM | POA: Insufficient documentation

## 2023-12-19 LAB — CMP (CANCER CENTER ONLY)
ALT: 11 U/L (ref 0–44)
AST: 16 U/L (ref 15–41)
Albumin: 3.6 g/dL (ref 3.5–5.0)
Alkaline Phosphatase: 53 U/L (ref 38–126)
Anion gap: 5 (ref 5–15)
BUN: 17 mg/dL (ref 8–23)
CO2: 28 mmol/L (ref 22–32)
Calcium: 8.7 mg/dL — ABNORMAL LOW (ref 8.9–10.3)
Chloride: 106 mmol/L (ref 98–111)
Creatinine: 1.13 mg/dL (ref 0.61–1.24)
GFR, Estimated: >60 mL/min (ref >60)
Glucose, Bld: 94 mg/dL (ref 70–99)
Potassium: 3.8 mmol/L (ref 3.5–5.1)
Sodium: 139 mmol/L (ref 135–145)
Total Bilirubin: 0.9 mg/dL (ref 0.0–1.2)
Total Protein: 7.9 g/dL (ref 6.5–8.1)

## 2023-12-19 LAB — CBC WITH DIFFERENTIAL (CANCER CENTER ONLY)
Abs Immature Granulocytes: 0.01 K/uL (ref 0.00–0.07)
Basophils Absolute: 0 K/uL (ref 0.0–0.1)
Basophils Relative: 0 %
Eosinophils Absolute: 0.4 K/uL (ref 0.0–0.5)
Eosinophils Relative: 7 %
HCT: 32.3 % — ABNORMAL LOW (ref 39.0–52.0)
Hemoglobin: 10.9 g/dL — ABNORMAL LOW (ref 13.0–17.0)
Immature Granulocytes: 0 %
Lymphocytes Relative: 24 %
Lymphs Abs: 1.4 K/uL (ref 0.7–4.0)
MCH: 32.1 pg (ref 26.0–34.0)
MCHC: 33.7 g/dL (ref 30.0–36.0)
MCV: 95 fL (ref 80.0–100.0)
Monocytes Absolute: 0.7 K/uL (ref 0.1–1.0)
Monocytes Relative: 12 %
Neutro Abs: 3.3 K/uL (ref 1.7–7.7)
Neutrophils Relative %: 57 %
Platelet Count: 259 K/uL (ref 150–400)
RBC: 3.4 MIL/uL — ABNORMAL LOW (ref 4.22–5.81)
RDW: 13.3 % (ref 11.5–15.5)
WBC Count: 5.9 K/uL (ref 4.0–10.5)
nRBC: 0 % (ref 0.0–0.2)

## 2023-12-19 MED ORDER — SODIUM CHLORIDE 0.9% FLUSH
10.0000 mL | Freq: Once | INTRAVENOUS | Status: AC
  Administered 2023-12-19: 10 mL

## 2023-12-20 ENCOUNTER — Telehealth: Payer: Self-pay

## 2023-12-20 ENCOUNTER — Inpatient Hospital Stay

## 2023-12-20 NOTE — Patient Instructions (Signed)
 Shaun Kennedy Balloon - I am sorry I was unable to reach you today for our scheduled appointment.   Your next care management appointment is by telephone on Wednesday, December 3 at 09:30 AM  Please call the care guide team at [phone] if you need to cancel, schedule, or reschedule an appointment.   Please call the Suicide and Crisis Lifeline: 988 if you are experiencing a Mental Health or Behavioral Health Crisis or need someone to talk to.  Thank you,   Clayborne Ly RN BSN CCM [name]  [facility], [facility] Nurse Care Coordinator  Direct Dial: [phone] Website: [email or web address]

## 2023-12-20 NOTE — Progress Notes (Signed)
 Pt sent home from CT/ flush department on 12/19/2023 w/ port left accessed for CT 'without contrast'. He returns today for port flushing and deaccess. Flushing /deaccess documented in flowsheet.   This concludes the interaction.  Rosaline Minerva, LPN

## 2023-12-26 ENCOUNTER — Inpatient Hospital Stay: Admitting: Internal Medicine

## 2023-12-26 VITALS — BP 153/61 | HR 73 | Temp 97.7°F | Resp 17 | Ht 68.0 in | Wt 188.0 lb

## 2023-12-26 DIAGNOSIS — E119 Type 2 diabetes mellitus without complications: Secondary | ICD-10-CM | POA: Diagnosis not present

## 2023-12-26 DIAGNOSIS — I10 Essential (primary) hypertension: Secondary | ICD-10-CM | POA: Diagnosis not present

## 2023-12-26 DIAGNOSIS — J45909 Unspecified asthma, uncomplicated: Secondary | ICD-10-CM | POA: Diagnosis not present

## 2023-12-26 DIAGNOSIS — Z87891 Personal history of nicotine dependence: Secondary | ICD-10-CM | POA: Diagnosis not present

## 2023-12-26 DIAGNOSIS — E785 Hyperlipidemia, unspecified: Secondary | ICD-10-CM | POA: Diagnosis not present

## 2023-12-26 DIAGNOSIS — C3490 Malignant neoplasm of unspecified part of unspecified bronchus or lung: Secondary | ICD-10-CM

## 2023-12-26 DIAGNOSIS — Z452 Encounter for adjustment and management of vascular access device: Secondary | ICD-10-CM | POA: Diagnosis not present

## 2023-12-26 DIAGNOSIS — Z7982 Long term (current) use of aspirin: Secondary | ICD-10-CM | POA: Diagnosis not present

## 2023-12-26 DIAGNOSIS — Z85038 Personal history of other malignant neoplasm of large intestine: Secondary | ICD-10-CM | POA: Diagnosis not present

## 2023-12-26 NOTE — Progress Notes (Signed)
 [facility] [phone]   [phone]  OFFICE PROGRESS NOTE  Shaun Norleen BROCKS, MD [address] [place] [address]  DIAGNOSIS: stage IV (T3, N2, M1 a) non-small cell lung cancer, squamous cell carcinoma presented with obstructive left lower lobe lung mass in addition to mediastinal lymphadenopathy as well as bilateral pulmonary nodules diagnosed in July 2021.   PDL1: 0%   PRIOR THERAPY:  1) Palliative radiotherapy to the obstructive left lower lobe lung mass under the care of Dr. Dewey.  2)  systemic chemotherapy 2 cycles of chemotherapy with carboplatin  for an AUC of 5 and paclitaxel  175 mg/m2 in addition to immunotherapy with nivolumab  360 mg every 3 weeks and ipilimumab  1 mg/kg IV every 6 weeks followed by maintenance nivolumab  and ipilimumab .  He started the first treatment on 09/04/2019.  He is status post 22 cycles.  Last was giving on February 16, 2022.  This treatment was discontinued after the patient completed more than 2 years on the immunotherapy.   CURRENT THERAPY: Observation  INTERVAL HISTORY: Shaun Kennedy 83 y.o. male returns to the clinic today for follow-up visit. Discussed the use of AI scribe software for clinical note transcription with the patient, who gave verbal consent to proceed.  History of Present Illness He is an 83 year old male with stage four non-small cell lung cancer who presents for evaluation and restaging with a repeat CT scan.  He has a history of stage four non-small cell lung cancer, squamous cell carcinoma, diagnosed in July 2021. Initial treatment included palliative radiotherapy for an obstructive left lower lobe lung mass, followed by systemic chemo-immunotherapy. This regimen consisted of two cycles of carboplatin  and paclitaxel , along with ipilimumab  and nivolumab , and maintenance therapy with ipilimumab  and nivolumab  for two years. His last dose was in January 2024, and he has been under  observation since then.  Currently, he is asymptomatic with no chest pain, breathing issues, vomiting, or weight loss. His weight has remained stable with a slight gain.    MEDICAL HISTORY: Past Medical History:  Diagnosis Date   Allergic rhinitis    Asthma    Carotid stenosis    Colon cancer (HCC) 2003   Diabetes mellitus (HCC)    Diverticulosis    Dyslipidemia    ED (erectile dysfunction)    GERD (gastroesophageal reflux disease)    H/O degenerative disc disease    Hemorrhoids    HTN (hypertension)    as a child   Hyperlipidemia    Lung cancer (HCC)    LVH (left ventricular hypertrophy)    on EKG   Mass of lower lobe of left lung    Mediastinal adenopathy    Smoker    former   Wears dentures    Wears dentures    Wears glasses    Wears glasses     ALLERGIES:  has no known allergies.  MEDICATIONS:  Current Outpatient Medications  Medication Sig Dispense Refill   amLODipine  (NORVASC ) 5 MG tablet Take 1 tablet (5 mg total) by mouth daily. 90 tablet 3   aspirin  EC 81 MG tablet Take 81 mg by mouth daily after breakfast.      cholecalciferol  (VITAMIN D ) 25 MCG (1000 UNIT) tablet Take 1,000 Units by mouth daily after breakfast.     donepezil  (ARICEPT ) 10 MG tablet Take 1 tablet (10 mg total) by mouth at bedtime. 90 tablet 1   ferrous sulfate  325 (65 FE) MG EC tablet Take  1 tablet (325 mg total) by mouth daily with breakfast. 90 tablet 3   [Paused] lisinopril -hydrochlorothiazide  (ZESTORETIC ) 20-12.5 MG tablet Take 1 tablet by mouth daily. (Patient not taking: Reported on 12/05/2023) 90 tablet 3   metFORMIN  (GLUCOPHAGE -XR) 500 MG 24 hr tablet Take 1 tablet (500 mg total) by mouth daily. (Patient not taking: Reported on 12/05/2023) 90 tablet 0   Propylene Glycol (SYSTANE COMPLETE) 0.6 % SOLN Place 1 drop into both eyes every morning.     simvastatin  (ZOCOR ) 40 MG tablet Take 1 tablet (40 mg total) by mouth daily. (Patient not taking: Reported on 12/05/2023) 90 tablet 1    solifenacin  (VESICARE ) 5 MG tablet Take 1 tablet (5 mg total) by mouth daily. (Patient not taking: Reported on 12/05/2023) 90 tablet 1   No current facility-administered medications for this visit.    SURGICAL HISTORY:  Past Surgical History:  Procedure Laterality Date   BRONCHIAL BIOPSY  08/20/2019   Procedure: BRONCHIAL BIOPSIES;  Surgeon: Shelah Lamar RAMAN, MD;  Location: [facility] ENDOSCOPY;  Service: Pulmonary;;   BRONCHIAL BRUSHINGS  08/20/2019   Procedure: BRONCHIAL BRUSHINGS;  Surgeon: Shelah Lamar RAMAN, MD;  Location: [facility] ENDOSCOPY;  Service: Pulmonary;;   BRONCHIAL NEEDLE ASPIRATION BIOPSY  08/20/2019   Procedure: BRONCHIAL NEEDLE ASPIRATION BIOPSIES;  Surgeon: Shelah Lamar RAMAN, MD;  Location: [facility] ENDOSCOPY;  Service: Pulmonary;;   CARPAL TUNNEL RELEASE Right 01/09/2019   Procedure: CARPAL TUNNEL RELEASE;  Surgeon: Jerri Kay HERO, MD;  Location: [facility];  Service: Orthopedics;  Laterality: Right;   CARPAL TUNNEL WITH CUBITAL TUNNEL Right 11/18/2020   Procedure: CARPAL TUNNEL WITH CUBITAL TUNNEL;  Surgeon: Sissy Cough, MD;  Location: [facility] OR;  Service: Orthopedics;  Laterality: Right;   CATARACT EXTRACTION Right 2018   COLONOSCOPY  2007   Dr. Rollin   DIRECT LARYNGOSCOPY N/A 04/10/2020   Procedure: DIRECT LARYNGOSCOPY;  Surgeon: Carlie Clark, MD;  Location: [facility] OR;  Service: ENT;  Laterality: N/A;   I & D EXTREMITY Right 04/02/2016   Procedure: IRRIGATION AND DEBRIDEMENT GREAT TOE;  Surgeon: Kay HERO Jerri, MD;  Location: [facility] OR;  Service: Orthopedics;  Laterality: Right;   IR IMAGING GUIDED PORT INSERTION  08/30/2019   MULTIPLE TOOTH EXTRACTIONS     RADIOACTIVE SEED IMPLANT  2003   TRACHEOSTOMY TUBE PLACEMENT N/A 04/10/2020   Procedure: TRACHEOSTOMY;  Surgeon: Carlie Clark, MD;  Location: [facility] OR;  Service: ENT;  Laterality: N/A;   ULNAR TUNNEL RELEASE Right 01/09/2019   Procedure: RIGHT CUBITAL TUNNEL RELEASE AND CARPAL TUNNEL RELEASE;  Surgeon: Jerri Kay HERO, MD;  Location: [facility];  Service: Orthopedics;  Laterality: Right;   UPPER GASTROINTESTINAL ENDOSCOPY     VIDEO BRONCHOSCOPY N/A 12/18/2019   Procedure: VIDEO BRONCHOSCOPY WITHOUT FLUORO;  Surgeon: Shelah Lamar RAMAN, MD;  Location: [facility] ENDOSCOPY;  Service: Cardiopulmonary;  Laterality: N/A;   VIDEO BRONCHOSCOPY WITH ENDOBRONCHIAL ULTRASOUND N/A 08/20/2019   Procedure: VIDEO BRONCHOSCOPY WITH ENDOBRONCHIAL ULTRASOUND;  Surgeon: Shelah Lamar RAMAN, MD;  [facility] END[facility]COPY;  Service: Pulmonary;  Laterality: N/A;    REVIEW OF SYSTEMS:  Constitutional: negative Eyes: negative Ears, nose, mouth, throat, and face: positive for hoarseness Respiratory: negative Cardiovascular: negative Gastrointestinal: negative Genitourinary:negative Integument/breast: negative Hematologic/lymphatic: negative Musculoskeletal:negative Neurological: negative Behavioral/Psych: negative Endocrine: negative Allergic/Immunologic: negative   PHYSICAL EXAMINATION: General appearance: alert, cooperative, and no distress Head: Normocephalic, without obvious abnormality, atraumatic Neck: no adenopathy, no JVD, supple, symmetrical, trachea midline, and thyroid  not enlarged, symmetric, no tenderness/mass/nodules Lymph nodes: Cervical, supraclavicular, and  axillary nodes normal. Resp: clear to auscultation bilaterally Back: symmetric, no curvature. ROM normal. No CVA tenderness. Cardio: regular rate and rhythm, S1, S2 normal, no murmur, click, rub or gallop GI: soft, non-tender; bowel sounds normal; no masses,  no organomegaly Extremities: extremities normal, atraumatic, no cyanosis or edema Neurologic: Alert and oriented X 3, normal strength and tone. Normal symmetric reflexes. Normal coordination and gait  ECOG PERFORMANCE STATUS: 1 - Symptomatic but completely ambulatory  Pulse 73, temperature 97.7 F (36.5 C), temperature source Temporal, resp. rate 17, height 5' 8 (1.727 m), weight 188 lb (85.3 kg), SpO2  99%.  LABORATORY DATA: Lab Results  Component Value Date   WBC 5.9 12/19/2023   HGB 10.9 (L) 12/19/2023   HCT 32.3 (L) 12/19/2023   MCV 95.0 12/19/2023   PLT 259 12/19/2023      Chemistry      Component Value Date/Time   NA 139 12/19/2023 1204   NA 138 11/08/2023 1407   K 3.8 12/19/2023 1204   CL 106 12/19/2023 1204   CO2 28 12/19/2023 1204   BUN 17 12/19/2023 1204   BUN 24 11/08/2023 1407   CREATININE 1.13 12/19/2023 1204   CREATININE 1.25 (H) 08/08/2016 0912      Component Value Date/Time   CALCIUM 8.7 (L) 12/19/2023 1204   ALKPHOS 53 12/19/2023 1204   AST 16 12/19/2023 1204   ALT 11 12/19/2023 1204   BILITOT 0.9 12/19/2023 1204       RADIOGRAPHIC STUDIES: CT Chest Wo Contrast Result Date: 12/21/2023 EXAM: CT CHEST, ABDOMEN AND PELVIS WITHOUT CONTRAST 12/19/2023 01:11:00 PM TECHNIQUE: CT of the chest, abdomen and pelvis was performed without the administration of intravenous contrast. Multiplanar reformatted images are provided for review. Automated exposure control, iterative reconstruction, and/or weight based adjustment of the mA/kV was utilized to reduce the radiation dose to as low as reasonably achievable. COMPARISON: CT 10/30/2023. CLINICAL HISTORY: Non-small cell lung cancer (NSCLC), staging. * Tracking Code: BO * FINDINGS: CHEST: MEDIASTINUM AND LYMPH NODES: Heart and pericardium are unremarkable. The central airways are clear. Tracheostomy tube in good position. No mediastinal, hilar or axillary lymphadenopathy. LUNGS AND PLEURA: Consolidation within the medial left upper lobe and medial left lower lobe with central air bronchograms has a linear margin consistent with radiation change. This is stable radiation change in and about the left hilum. There is a discrete nodule in the left lower lobe measuring 7 mm, stable from comparison exam. No new pulmonary nodules. No evidence of lung anterior progression in the thorax. No pleural effusion or pneumothorax. ABDOMEN AND  PELVIS: LIVER: No liver metastasis on noncontrast exam. GALLBLADDER AND BILE DUCTS: Gallbladder is unremarkable. No biliary ductal dilatation. SPLEEN: No acute abnormality. PANCREAS: No acute abnormality. ADRENAL GLANDS: Glands are normal. KIDNEYS, URETERS AND BLADDER: There is mild dilatation of the distal left ureter which has not changed from comparison CT. No obstructing lesion identified. No stones in the kidneys or ureters. No hydronephrosis. No perinephric or periureteral stranding. Urinary bladder is unremarkable. GI AND BOWEL: Stomach demonstrates no acute abnormality. There is no bowel obstruction. REPRODUCTIVE ORGANS: Multiple brachytherapy seeds within the prostate gland. PERITONEUM AND RETROPERITONEUM: No ascites. No free air. VASCULATURE: Aorta is normal in caliber. ABDOMINAL AND PELVIS LYMPH NODES: No lymphadenopathy. BONES AND SOFT TISSUES: No aggressive osseous lesion. No skeletal metastases. Multiple endplate cystic changes in the lumbar spine consistent with degenerative disc disease. No focal soft tissue abnormality. IMPRESSION: 1. No evidence of lung cancer progression. 2. Stable radiation-related consolidation in  the medial left upper and lower lobes about the left hilum. 3. Stable 7 mm left lower lobe pulmonary nodule. 4. No evidence of metastatic disease in the abdomen or pelvis on this noncontrast exam. 5. No aggressive osseous lesion. 6. Stable mild dilatation of the distal left ureter without obstructing lesion identified. Electronically signed by: Norleen Boxer MD 12/21/2023 01:28 PM EST RP Workstation: [id number]   CT ABDOMEN PELVIS WO CONTRAST Result Date: 12/21/2023 EXAM: CT CHEST, ABDOMEN AND PELVIS WITHOUT CONTRAST 12/19/2023 01:11:00 PM TECHNIQUE: CT of the chest, abdomen and pelvis was performed without the administration of intravenous contrast. Multiplanar reformatted images are provided for review. Automated exposure control, iterative reconstruction, and/or weight based  adjustment of the mA/kV was utilized to reduce the radiation dose to as low as reasonably achievable. COMPARISON: CT 10/30/2023. CLINICAL HISTORY: Non-small cell lung cancer (NSCLC), staging. * Tracking Code: BO * FINDINGS: CHEST: MEDIASTINUM AND LYMPH NODES: Heart and pericardium are unremarkable. The central airways are clear. Tracheostomy tube in good position. No mediastinal, hilar or axillary lymphadenopathy. LUNGS AND PLEURA: Consolidation within the medial left upper lobe and medial left lower lobe with central air bronchograms has a linear margin consistent with radiation change. This is stable radiation change in and about the left hilum. There is a discrete nodule in the left lower lobe measuring 7 mm, stable from comparison exam. No new pulmonary nodules. No evidence of lung anterior progression in the thorax. No pleural effusion or pneumothorax. ABDOMEN AND PELVIS: LIVER: No liver metastasis on noncontrast exam. GALLBLADDER AND BILE DUCTS: Gallbladder is unremarkable. No biliary ductal dilatation. SPLEEN: No acute abnormality. PANCREAS: No acute abnormality. ADRENAL GLANDS: Glands are normal. KIDNEYS, URETERS AND BLADDER: There is mild dilatation of the distal left ureter which has not changed from comparison CT. No obstructing lesion identified. No stones in the kidneys or ureters. No hydronephrosis. No perinephric or periureteral stranding. Urinary bladder is unremarkable. GI AND BOWEL: Stomach demonstrates no acute abnormality. There is no bowel obstruction. REPRODUCTIVE ORGANS: Multiple brachytherapy seeds within the prostate gland. PERITONEUM AND RETROPERITONEUM: No ascites. No free air. VASCULATURE: Aorta is normal in caliber. ABDOMINAL AND PELVIS LYMPH NODES: No lymphadenopathy. BONES AND SOFT TISSUES: No aggressive osseous lesion. No skeletal metastases. Multiple endplate cystic changes in the lumbar spine consistent with degenerative disc disease. No focal soft tissue abnormality. IMPRESSION: 1.  No evidence of lung cancer progression. 2. Stable radiation-related consolidation in the medial left upper and lower lobes about the left hilum. 3. Stable 7 mm left lower lobe pulmonary nodule. 4. No evidence of metastatic disease in the abdomen or pelvis on this noncontrast exam. 5. No aggressive osseous lesion. 6. Stable mild dilatation of the distal left ureter without obstructing lesion identified. Electronically signed by: Norleen Boxer MD 12/21/2023 01:28 PM EST RP Workstation: [id number]    ASSESSMENT AND PLAN: This is a very pleasant 83 years old [name] with stage IV non-small cell lung cancer, squamous cell carcinoma with negative PD-L1 expression. The patient underwent systemic chemotherapy with carboplatin  and paclitaxel  for 2 cycles in addition to immunotherapy with ipilimumab  1 mg/KG every 6 weeks and nivolumab  360 mg IV every 3 weeks status post 22 cycles. The patient is currently on observation and he is feeling fine with no concerning complaints. He had repeat CT scan of the chest, abdomen pelvis performed recently.  I personally and independently reviewed the scan and discussed the result with the patient today.  His scan showed no concerning findings for disease progression. Assessment and  Plan Assessment & Plan Stage IV non-small cell lung cancer, squamous cell carcinoma, left lower lobe Stage IV non-small cell lung cancer, squamous cell carcinoma, located in the left lower lobe. Diagnosed in July 2021. Underwent palliative radiotherapy and systemic chemoimmunotherapy with carboplatin , paclitaxel , ipilimumab , and nivolumab . Last dose of maintenance therapy was in January 2024. Currently on observation with no evidence of disease progression on recent CT scan of the chest, abdomen, and pelvis. No symptoms such as chest pain, dyspnea, or weight loss reported. - Continue observation with no active treatment. - Scheduled follow-up CT scan of the chest, abdomen, and pelvis in  six months.  Hypertension Blood pressure is slightly elevated. - Monitor blood pressure regularly. He was advised to call immediately if he has any other concerning symptoms in the interval.  The patient voices understanding of current disease status and treatment options and is in agreement with the current care plan.  All questions were answered. The patient knows to call the clinic with any problems, questions or concerns. We can certainly see the patient much sooner if necessary. The total time spent in the appointment was 30 minutes.  Disclaimer: This note was dictated with voice recognition software. Similar sounding words can inadvertently be transcribed and may not be corrected upon review.

## 2023-12-28 ENCOUNTER — Ambulatory Visit (INDEPENDENT_AMBULATORY_CARE_PROVIDER_SITE_OTHER): Admitting: Family Medicine

## 2023-12-28 ENCOUNTER — Encounter: Payer: Self-pay | Admitting: Family Medicine

## 2023-12-28 VITALS — BP 138/76 | HR 83

## 2023-12-28 DIAGNOSIS — Z7984 Long term (current) use of oral hypoglycemic drugs: Secondary | ICD-10-CM

## 2023-12-28 DIAGNOSIS — Z0000 Encounter for general adult medical examination without abnormal findings: Secondary | ICD-10-CM | POA: Diagnosis not present

## 2023-12-28 DIAGNOSIS — E119 Type 2 diabetes mellitus without complications: Secondary | ICD-10-CM | POA: Diagnosis not present

## 2023-12-28 DIAGNOSIS — N3281 Overactive bladder: Secondary | ICD-10-CM | POA: Diagnosis not present

## 2023-12-28 DIAGNOSIS — I152 Hypertension secondary to endocrine disorders: Secondary | ICD-10-CM

## 2023-12-28 DIAGNOSIS — E785 Hyperlipidemia, unspecified: Secondary | ICD-10-CM | POA: Diagnosis not present

## 2023-12-28 DIAGNOSIS — Z87898 Personal history of other specified conditions: Secondary | ICD-10-CM

## 2023-12-28 DIAGNOSIS — E1169 Type 2 diabetes mellitus with other specified complication: Secondary | ICD-10-CM | POA: Diagnosis not present

## 2023-12-28 DIAGNOSIS — E1159 Type 2 diabetes mellitus with other circulatory complications: Secondary | ICD-10-CM

## 2023-12-28 LAB — POCT GLYCOSYLATED HEMOGLOBIN (HGB A1C): Hemoglobin A1C: 5.7 % — AB (ref 4.0–5.6)

## 2023-12-28 MED ORDER — SIMVASTATIN 40 MG PO TABS: 40.0000 mg | ORAL_TABLET | Freq: Every day | ORAL | 1 refills | Status: AC

## 2023-12-28 MED ORDER — SOLIFENACIN SUCCINATE 5 MG PO TABS: 5.0000 mg | ORAL_TABLET | Freq: Every day | ORAL | 1 refills | Status: AC

## 2023-12-28 MED ORDER — LISINOPRIL 20 MG PO TABS: 20.0000 mg | ORAL_TABLET | Freq: Every day | ORAL | 3 refills | Status: AC

## 2023-12-28 NOTE — Patient Instructions (Signed)
 Please start taking the new lisinopril  medication that I have to sent to your pharmacy.

## 2023-12-28 NOTE — Progress Notes (Signed)
   Name: Shaun Kennedy   Date of Visit: 12/28/23   Date of last visit with me: Visit date not found   CHIEF COMPLAINT:  Chief Complaint  Patient presents with   Annual Exam    Cpe and recheck bp        HPI:  Discussed the use of AI scribe software for clinical note transcription with the patient, who gave verbal consent to proceed.  History of Present Illness   Shaun Kennedy is an 83 year old male with hypertension who presents for medication management.  He was advised to discontinue his combination blood pressure medication, which included lisinopril  and hydrochlorothiazide , about a month ago. At that time, his creatinine was 1.8. Recent labs from nine days ago show a decrease in creatinine to 1.13, which is within normal limits.  He is currently not taking simvastatin , a cholesterol-lowering medication, as he thought it was 'out of base'. He does not recall being instructed to stop this medication.  No specific symptoms or issues at this time and feels generally well.         OBJECTIVE:       12/28/2023    2:35 PM  Depression screen PHQ 2/9  Decreased Interest 0  Down, Depressed, Hopeless 0  PHQ - 2 Score 0     BP Readings from Last 3 Encounters:  12/28/23 138/76  12/26/23 (!) 153/61  12/05/23 (!) 140/68    BP 138/76   Pulse 83   SpO2 97%    Physical Exam   VITALS: BP- 138/76      Physical Exam Constitutional:      Appearance: Normal appearance.  Neurological:     General: No focal deficit present.     Mental Status: He is alert and oriented to person, place, and time. Mental status is at baseline.     ASSESSMENT/PLAN:   Assessment & Plan Annual physical exam  OAB (overactive bladder)  Hyperlipidemia associated with type 2 diabetes mellitus (HCC)  Hypertension associated with diabetes (HCC)  Diabetes mellitus treated with oral medication (HCC)  History of elevated prostate specific antigen (PSA)    Assessment and Plan    Adult  Wellness Visit Routine wellness visit with slightly elevated blood pressure. - Checked prostate levels. - Ordered basic blood tests. - Ordered cholesterol test.  Hypertension Blood pressure elevated at 138/76 mmHg. Resumed lisinopril  for blood pressure control and renal protection. - Resumed lisinopril  monotherapy. Stop hydrochlorothiazide  at this time.  - Monitor blood pressure and adjust treatment if necessary. - CBC and CMP pending   Chronic kidney disease, unspecified stage Creatinine normalized to 1.13 mg/dL. Lisinopril  provides renal protection. - Continue lisinopril  for renal protection.  Hyperlipidemia Discussed importance of resuming simvastatin  for cholesterol management. - Resume simvastatin  for cholesterol management.  - Lipid panel pending         [name] A. Vita MD [facility]

## 2023-12-29 ENCOUNTER — Ambulatory Visit: Payer: Self-pay | Admitting: Family Medicine

## 2023-12-29 LAB — CBC WITH DIFFERENTIAL/PLATELET
Basophils Absolute: 0 x10E3/uL (ref 0.0–0.2)
Basos: 0 %
EOS (ABSOLUTE): 0.4 x10E3/uL (ref 0.0–0.4)
Eos: 6 %
Hematocrit: 35.3 % — ABNORMAL LOW (ref 37.5–51.0)
Hemoglobin: 11.6 g/dL — ABNORMAL LOW (ref 13.0–17.7)
Immature Grans (Abs): 0 x10E3/uL (ref 0.0–0.1)
Immature Granulocytes: 0 %
Lymphocytes Absolute: 1.6 x10E3/uL (ref 0.7–3.1)
Lymphs: 26 %
MCH: 31.3 pg (ref 26.6–33.0)
MCHC: 32.9 g/dL (ref 31.5–35.7)
MCV: 95 fL (ref 79–97)
Monocytes Absolute: 0.6 x10E3/uL (ref 0.1–0.9)
Monocytes: 9 %
Neutrophils Absolute: 3.6 x10E3/uL (ref 1.4–7.0)
Neutrophils: 59 %
Platelets: 306 x10E3/uL (ref 150–450)
RBC: 3.71 x10E6/uL — ABNORMAL LOW (ref 4.14–5.80)
RDW: 12.6 % (ref 11.6–15.4)
WBC: 6.2 x10E3/uL (ref 3.4–10.8)

## 2023-12-29 LAB — PSA: Prostate Specific Ag, Serum: <0.1 ng/mL (ref 0.0–4.0)

## 2023-12-29 LAB — LIPID PANEL
Chol/HDL Ratio: 2.4 ratio (ref 0.0–5.0)
Cholesterol, Total: 208 mg/dL — ABNORMAL HIGH (ref 100–199)
HDL: 87 mg/dL (ref >39)
LDL Chol Calc (NIH): 103 mg/dL — ABNORMAL HIGH (ref 0–99)
Triglycerides: 103 mg/dL (ref 0–149)
VLDL Cholesterol Cal: 18 mg/dL (ref 5–40)

## 2023-12-29 NOTE — Progress Notes (Signed)
Called Patient and went over labs

## 2024-01-10 ENCOUNTER — Other Ambulatory Visit: Payer: Self-pay

## 2024-01-10 NOTE — Patient Outreach (Signed)
 Complex Care Management   Visit Note  01/10/2024  Name:  Shaun Kennedy MRN: [id number] DOB: 06-25-1940  Situation: Referral received for Complex Care Management related to Hypertension associated with diabetes, Atherosclerosis of aorta, Glottis stenosis, Tracheostomy, Malignant neoplasm of lower lobe of left lung, type 2 Diabetes, non-insulin  dependent, mild cognitive impairment. I obtained verbal consent from Patient.  Visit completed with Patient on the phone.  Background:   Past Medical History:  Diagnosis Date   Allergic rhinitis    Asthma    Carotid stenosis    Colon cancer (HCC) 2003   Diabetes mellitus (HCC)    Diverticulosis    Dyslipidemia    ED (erectile dysfunction)    GERD (gastroesophageal reflux disease)    H/O degenerative disc disease    Hemorrhoids    HTN (hypertension)    as a child   Hyperlipidemia    Lung cancer (HCC)    LVH (left ventricular hypertrophy)    on EKG   Mass of lower lobe of left lung    Mediastinal adenopathy    Smoker    former   Wears dentures    Wears dentures    Wears glasses    Wears glasses     Assessment: Patient Reported Symptoms:  Cognitive Cognitive Status: Alert and oriented to person, place, and time, Normal speech and language skills Cognitive/Intellectual Conditions Management [RPT]: None reported or documented in medical history or problem list   Health Maintenance Behaviors: Annual physical exam, Healthy diet Health Facilitated by: Healthy diet, Rest  Neurological Neurological Review of Symptoms: No symptoms reported    HEENT HEENT Symptoms Reported: No symptoms reported      Cardiovascular Cardiovascular Symptoms Reported: No symptoms reported Does patient have uncontrolled Hypertension?: No Cardiovascular Management Strategies: Medication therapy, Routine screening Cardiovascular Self-Management Outcome: 4 (good)  Respiratory Respiratory Symptoms Reported: Shortness of breath, Productive cough Other  Respiratory Symptoms: patient has a tracheostomy Respiratory Management Strategies: Routine screening, Breathing techniques, Coping strategies (suctioning) Respiratory Self-Management Outcome: 4 (good)  Endocrine Endocrine Symptoms Reported: No symptoms reported Is patient diabetic?: Yes Is patient checking blood sugars at home?: No Endocrine Self-Management Outcome: 4 (good)  Gastrointestinal Gastrointestinal Symptoms Reported: No symptoms reported      Genitourinary Genitourinary Symptoms Reported: No symptoms reported    Integumentary Integumentary Symptoms Reported: No symptoms reported    Musculoskeletal Musculoskelatal Symptoms Reviewed: No symptoms reported        Psychosocial Psychosocial Symptoms Reported: No symptoms reported          01/10/2024    PHQ2-9 Depression Screening   Shaun Kennedy interest or pleasure in doing things    Feeling down, depressed, or hopeless    PHQ-2 - Total Score    Trouble falling or staying asleep, or sleeping too much    Feeling tired or having Shaun Kennedy energy    Poor appetite or overeating     Feeling bad about yourself - or that you are a failure or have let yourself or your family down    Trouble concentrating on things, such as reading the newspaper or watching television    Moving or speaking so slowly that other people could have noticed.  Or the opposite - being so fidgety or restless that you have been moving around a lot more than usual    Thoughts that you would be better off dead, or hurting yourself in some way    PHQ2-9 Total Score    If you checked off any problems, how difficult  have these problems made it for you to do your work, take care of things at home, or get along with other people    Depression Interventions/Treatment      There were no vitals filed for this visit. Pain Scale: Not given for pain  Medications Reviewed Today     Reviewed by Morgan Clayborne CROME, RN (Registered Nurse) on 01/10/24 at [phone]  Med List Status:  <None>   Medication Order Taking? Sig Documenting Provider Last Dose Status Informant  amLODipine  (NORVASC ) 5 MG tablet [id number]  Take 1 tablet (5 mg total) by mouth daily. [name], MD  Active   aspirin  EC 81 MG tablet [id number]  Take 81 mg by mouth daily after breakfast.  [provider]  Active Self, Pharmacy Records  cholecalciferol  (VITAMIN D ) 25 MCG (1000 UNIT) tablet [id number]  Take 1,000 Units by mouth daily after breakfast. [provider]  Active Self, Pharmacy Records  donepezil  (ARICEPT ) 10 MG tablet [id number]  Take 1 tablet (10 mg total) by mouth at bedtime. [name], MD  Active   ferrous sulfate  325 (65 FE) MG EC tablet [id number]  Take 1 tablet (325 mg total) by mouth daily with breakfast. [name], MD  Active   lisinopril  (ZESTRIL ) 20 MG tablet [id number]  Take 1 tablet (20 mg total) by mouth daily. Jha, Panav, MD  Active   metFORMIN  (GLUCOPHAGE -XR) 500 MG 24 hr tablet [id number]  Take 1 tablet (500 mg total) by mouth daily. [name], MD  Active Self, Pharmacy Records  Propylene Glycol [facility] COMPLETE) 0.6 % SOLN [id number]  Place 1 drop into both eyes every morning. [provider]  Active Self, Pharmacy Records  simvastatin  (ZOCOR ) 40 MG tablet [id number]  Take 1 tablet (40 mg total) by mouth daily. Jha, Panav, MD  Active   solifenacin  (VESICARE ) 5 MG tablet [id number]  Take 1 tablet (5 mg total) by mouth daily. Vita Morrow, MD  Active   Med List Note Shaun Kennedy 10/31/23 0221): Dialyisis: [email or web address]  Long            Recommendation:   Continue Current Plan of Care  Follow Up Plan:   Closing From:  Complex Care Management  Clayborne Morgan RN BSN CCM [facility]  [facility], [facility] Nurse Care Coordinator  Direct Dial: [phone] Website: [email or web address]

## 2024-01-10 NOTE — Patient Instructions (Signed)
 Visit Information  Thank you for taking time to visit with me today.    Please call 1-800-273-TALK (toll free, 24 hour hotline) if you are experiencing a Mental Health or Behavioral Health Crisis or need someone to talk to.  Clayborne Ly RN BSN CCM [place]  [facility], [facility] Nurse Care Coordinator  Direct Dial: [phone] Website: [email or web address]

## 2024-02-05 ENCOUNTER — Other Ambulatory Visit (HOSPITAL_BASED_OUTPATIENT_CLINIC_OR_DEPARTMENT_OTHER): Payer: Self-pay

## 2024-02-05 ENCOUNTER — Other Ambulatory Visit (HOSPITAL_COMMUNITY): Payer: Self-pay

## 2024-03-01 ENCOUNTER — Other Ambulatory Visit: Payer: Self-pay | Admitting: Family Medicine

## 2024-03-01 DIAGNOSIS — E119 Type 2 diabetes mellitus without complications: Secondary | ICD-10-CM

## 2024-03-18 ENCOUNTER — Inpatient Hospital Stay (HOSPITAL_COMMUNITY): Admission: RE | Admit: 2024-03-18 | Source: Ambulatory Visit

## 2024-05-07 ENCOUNTER — Ambulatory Visit: Payer: Self-pay

## 2024-06-17 ENCOUNTER — Inpatient Hospital Stay

## 2024-06-24 ENCOUNTER — Inpatient Hospital Stay: Admitting: Internal Medicine
